# Patient Record
Sex: Male | Born: 1950
Health system: Southern US, Community
[De-identification: ages and names within clinical notes are randomized; demographics above are authoritative.]

## PROBLEM LIST (undated history)

## (undated) DIAGNOSIS — F32A Depression, unspecified: Secondary | ICD-10-CM

## (undated) DIAGNOSIS — I499 Cardiac arrhythmia, unspecified: Secondary | ICD-10-CM

## (undated) DIAGNOSIS — J309 Allergic rhinitis, unspecified: Secondary | ICD-10-CM

## (undated) DIAGNOSIS — M1990 Unspecified osteoarthritis, unspecified site: Secondary | ICD-10-CM

## (undated) DIAGNOSIS — I639 Cerebral infarction, unspecified: Secondary | ICD-10-CM

## (undated) DIAGNOSIS — R011 Cardiac murmur, unspecified: Secondary | ICD-10-CM

## (undated) DIAGNOSIS — K5792 Diverticulitis of intestine, part unspecified, without perforation or abscess without bleeding: Secondary | ICD-10-CM

## (undated) DIAGNOSIS — E785 Hyperlipidemia, unspecified: Secondary | ICD-10-CM

## (undated) DIAGNOSIS — I4891 Unspecified atrial fibrillation: Secondary | ICD-10-CM

## (undated) DIAGNOSIS — R6882 Decreased libido: Secondary | ICD-10-CM

## (undated) DIAGNOSIS — M5136 Other intervertebral disc degeneration, lumbar region: Secondary | ICD-10-CM

## (undated) DIAGNOSIS — E291 Testicular hypofunction: Secondary | ICD-10-CM

## (undated) DIAGNOSIS — R7303 Prediabetes: Secondary | ICD-10-CM

## (undated) DIAGNOSIS — Z9989 Dependence on other enabling machines and devices: Secondary | ICD-10-CM

## (undated) DIAGNOSIS — N132 Hydronephrosis with renal and ureteral calculous obstruction: Secondary | ICD-10-CM

## (undated) DIAGNOSIS — T7840XA Allergy, unspecified, initial encounter: Secondary | ICD-10-CM

## (undated) DIAGNOSIS — G4733 Obstructive sleep apnea (adult) (pediatric): Secondary | ICD-10-CM

## (undated) DIAGNOSIS — Z7901 Long term (current) use of anticoagulants: Secondary | ICD-10-CM

## (undated) DIAGNOSIS — K219 Gastro-esophageal reflux disease without esophagitis: Secondary | ICD-10-CM

## (undated) DIAGNOSIS — Z87442 Personal history of urinary calculi: Secondary | ICD-10-CM

## (undated) DIAGNOSIS — N201 Calculus of ureter: Secondary | ICD-10-CM

## (undated) DIAGNOSIS — K5790 Diverticulosis of intestine, part unspecified, without perforation or abscess without bleeding: Secondary | ICD-10-CM

## (undated) DIAGNOSIS — G4730 Sleep apnea, unspecified: Secondary | ICD-10-CM

## (undated) DIAGNOSIS — T8859XA Other complications of anesthesia, initial encounter: Secondary | ICD-10-CM

## (undated) DIAGNOSIS — F419 Anxiety disorder, unspecified: Secondary | ICD-10-CM

## (undated) DIAGNOSIS — Z8679 Personal history of other diseases of the circulatory system: Secondary | ICD-10-CM

## (undated) DIAGNOSIS — N400 Enlarged prostate without lower urinary tract symptoms: Secondary | ICD-10-CM

## (undated) DIAGNOSIS — M51369 Other intervertebral disc degeneration, lumbar region without mention of lumbar back pain or lower extremity pain: Secondary | ICD-10-CM

## (undated) DIAGNOSIS — I10 Essential (primary) hypertension: Secondary | ICD-10-CM

## (undated) DIAGNOSIS — K760 Fatty (change of) liver, not elsewhere classified: Secondary | ICD-10-CM

## (undated) HISTORY — PX: KNEE SURGERY: SHX244

## (undated) HISTORY — DX: Cerebral infarction, unspecified: I63.9

## (undated) HISTORY — DX: Hyperlipidemia, unspecified: E78.5

## (undated) HISTORY — DX: Allergy, unspecified, initial encounter: T78.40XA

## (undated) HISTORY — PX: FINGER SURGERY: SHX640

## (undated) HISTORY — DX: Gastro-esophageal reflux disease without esophagitis: K21.9

## (undated) HISTORY — DX: Sleep apnea, unspecified: G47.30

## (undated) HISTORY — DX: Essential (primary) hypertension: I10

## (undated) HISTORY — DX: Hydronephrosis with renal and ureteral calculous obstruction: N13.2

## (undated) HISTORY — PX: OTHER SURGICAL HISTORY: SHX169

## (undated) HISTORY — DX: Allergic rhinitis, unspecified: J30.9

## (undated) HISTORY — DX: Decreased libido: R68.82

## (undated) HISTORY — DX: Calculus of ureter: N20.1

## (undated) HISTORY — PX: EXTERNAL EAR SURGERY: SHX627

## (undated) HISTORY — DX: Diverticulitis of intestine, part unspecified, without perforation or abscess without bleeding: K57.92

## (undated) HISTORY — PX: COLONOSCOPY: SHX174

## (undated) HISTORY — PX: EYE SURGERY: SHX253

## (undated) HISTORY — DX: Testicular hypofunction: E29.1

---

## 2007-07-25 ENCOUNTER — Ambulatory Visit: Payer: Self-pay | Admitting: Ophthalmology

## 2009-05-07 ENCOUNTER — Ambulatory Visit: Payer: Self-pay | Admitting: Family Medicine

## 2011-10-18 ENCOUNTER — Ambulatory Visit: Payer: Self-pay | Admitting: Ophthalmology

## 2011-11-01 ENCOUNTER — Ambulatory Visit: Payer: Self-pay | Admitting: Ophthalmology

## 2013-06-28 ENCOUNTER — Ambulatory Visit: Payer: Self-pay | Admitting: Family Medicine

## 2013-07-18 ENCOUNTER — Ambulatory Visit: Payer: Self-pay | Admitting: Urology

## 2013-09-26 ENCOUNTER — Ambulatory Visit: Payer: Self-pay | Admitting: Gastroenterology

## 2013-11-11 ENCOUNTER — Ambulatory Visit: Payer: Self-pay

## 2013-11-11 ENCOUNTER — Other Ambulatory Visit: Payer: Self-pay | Admitting: Occupational Medicine

## 2013-11-11 DIAGNOSIS — R52 Pain, unspecified: Secondary | ICD-10-CM

## 2015-06-30 ENCOUNTER — Encounter: Payer: Self-pay | Admitting: Family Medicine

## 2015-06-30 ENCOUNTER — Ambulatory Visit (INDEPENDENT_AMBULATORY_CARE_PROVIDER_SITE_OTHER): Payer: Managed Care, Other (non HMO) | Admitting: Family Medicine

## 2015-06-30 ENCOUNTER — Telehealth: Payer: Self-pay | Admitting: Family Medicine

## 2015-06-30 VITALS — BP 130/80 | HR 71 | Temp 98.9°F | Resp 18 | Ht 71.0 in | Wt 257.9 lb

## 2015-06-30 DIAGNOSIS — K5732 Diverticulitis of large intestine without perforation or abscess without bleeding: Secondary | ICD-10-CM | POA: Diagnosis not present

## 2015-06-30 DIAGNOSIS — G4733 Obstructive sleep apnea (adult) (pediatric): Secondary | ICD-10-CM | POA: Insufficient documentation

## 2015-06-30 DIAGNOSIS — Z9989 Dependence on other enabling machines and devices: Secondary | ICD-10-CM

## 2015-06-30 DIAGNOSIS — R609 Edema, unspecified: Secondary | ICD-10-CM | POA: Insufficient documentation

## 2015-06-30 DIAGNOSIS — N23 Unspecified renal colic: Secondary | ICD-10-CM | POA: Diagnosis not present

## 2015-06-30 LAB — POCT URINALYSIS DIPSTICK
Bilirubin, UA: NEGATIVE
Glucose, UA: NEGATIVE
Ketones, UA: NEGATIVE
Leukocytes, UA: NEGATIVE
Nitrite, UA: NEGATIVE
Spec Grav, UA: 1.01
Urobilinogen, UA: 0.2
pH, UA: 5

## 2015-06-30 MED ORDER — LEVOFLOXACIN 750 MG PO TABS: 750.0000 mg | ORAL_TABLET | Freq: Every day | ORAL | Status: DC

## 2015-06-30 NOTE — Telephone Encounter (Signed)
Patient called back to give you the name of a medication. It is called Ketorolac  and he take 1 tablet 3x daily as needed.

## 2015-06-30 NOTE — Patient Instructions (Addendum)
Obesity Obesity is defined as having too much total body fat and a body mass index (BMI) of 30 or more. BMI is an estimate of body fat and is calculated from your height and weight. Obesity happens when you consume more calories than you can burn by exercising or performing daily physical tasks. Prolonged obesity can cause major illnesses or emergencies, such as:   Stroke.  Heart disease.  Diabetes.  Cancer.  Arthritis.  High blood pressure (hypertension).  High cholesterol.  Sleep apnea.  Erectile dysfunction.  Infertility problems. CAUSES   Regularly eating unhealthy foods.  Physical inactivity.  Certain disorders, such as an underactive thyroid (hypothyroidism), Cushing's syndrome, and polycystic ovarian syndrome.  Certain medicines, such as steroids, some depression medicines, and antipsychotics.  Genetics.  Lack of sleep. DIAGNOSIS  A health care provider can diagnose obesity after calculating your BMI. Obesity will be diagnosed if your BMI is 30 or higher.  There are other methods of measuring obesity levels. Some other methods include measuring your skinfold thickness, your waist circumference, and comparing your hip circumference to your waist circumference. TREATMENT  A healthy treatment program includes some or all of the following:  Long-term dietary changes.  Exercise and physical activity.  Behavioral and lifestyle changes.  Medicine only under the supervision of your health care provider. Medicines may help, but only if they are used with diet and exercise programs. An unhealthy treatment program includes:  Fasting.  Fad diets.  Supplements and drugs. These choices do not succeed in long-term weight control.  HOME CARE INSTRUCTIONS   Exercise and perform physical activity as directed by your health care provider. To increase physical activity, try the following:  Use stairs instead of elevators.  Park farther away from store  entrances.  Garden, bike, or walk instead of watching television or using the computer.  Eat healthy, low-calorie foods and drinks on a regular basis. Eat more fruits and vegetables. Use low-calorie cookbooks or take healthy cooking classes.  Limit fast food, sweets, and processed snack foods.  Eat smaller portions.  Keep a daily journal of everything you eat. There are many free websites to help you with this. It may be helpful to measure your foods so you can determine if you are eating the correct portion sizes.  Avoid drinking alcohol. Drink more water and drinks without calories.  Take vitamins and supplements only as recommended by your health care provider.  Weight-loss support groups, Government social research officer, counselors, and stress reduction education can also be very helpful. SEEK IMMEDIATE MEDICAL CARE IF:  You have chest pain or tightness.  You have trouble breathing or feel short of breath.  You have weakness or leg numbness.  You feel confused or have trouble talking.  You have sudden changes in your vision. MAKE SURE YOU:  Understand these instructions.  Will watch your condition.  Will get help right away if you are not doing well or get worse. Document Released: 12/29/2004 Document Revised: 04/07/2014 Document Reviewed: 12/28/2011 [facility] Patient Information 2015 [place], [place]. This information is not intended to replace advice given to you by your health care provider. Make sure you discuss any questions you have with your health care provider. Kidney Stones Kidney stones (urolithiasis) are deposits that form inside your kidneys. The intense pain is caused by the stone moving through the urinary tract. When the stone moves, the ureter goes into spasm around the stone. The stone is usually passed in the urine.  CAUSES   A disorder that makes  certain neck glands produce too much parathyroid hormone (primary hyperparathyroidism).  A buildup of uric acid  crystals, similar to gout in your joints.  Narrowing (stricture) of the ureter.  A kidney obstruction present at birth (congenital obstruction).  Previous surgery on the kidney or ureters.  Numerous kidney infections. SYMPTOMS   Feeling sick to your stomach (nauseous).  Throwing up (vomiting).  Blood in the urine (hematuria).  Pain that usually spreads (radiates) to the groin.  Frequency or urgency of urination. DIAGNOSIS   Taking a history and physical exam.  Blood or urine tests.  CT scan.  Occasionally, an examination of the inside of the urinary bladder (cystoscopy) is performed. TREATMENT   Observation.  Increasing your fluid intake.  Extracorporeal shock wave lithotripsy--This is a noninvasive procedure that uses shock waves to break up kidney stones.  Surgery may be needed if you have severe pain or persistent obstruction. There are various surgical procedures. Most of the procedures are performed with the use of small instruments. Only small incisions are needed to accommodate these instruments, so recovery time is minimized. The size, location, and chemical composition are all important variables that will determine the proper choice of action for you. Talk to your health care provider to better understand your situation so that you will minimize the risk of injury to yourself and your kidney.  HOME CARE INSTRUCTIONS   Drink enough water and fluids to keep your urine clear or pale yellow. This will help you to pass the stone or stone fragments.  Strain all urine through the provided strainer. Keep all particulate matter and stones for your health care provider to see. The stone causing the pain may be as small as a grain of salt. It is very important to use the strainer each and every time you pass your urine. The collection of your stone will allow your health care provider to analyze it and verify that a stone has actually passed. The stone analysis will often  identify what you can do to reduce the incidence of recurrences.  Only take over-the-counter or prescription medicines for pain, discomfort, or fever as directed by your health care provider.  Make a follow-up appointment with your health care provider as directed.  Get follow-up X-rays if required. The absence of pain does not always mean that the stone has passed. It may have only stopped moving. If the urine remains completely obstructed, it can cause loss of kidney function or even complete destruction of the kidney. It is your responsibility to make sure X-rays and follow-ups are completed. Ultrasounds of the kidney can show blockages and the status of the kidney. Ultrasounds are not associated with any radiation and can be performed easily in a matter of minutes. SEEK MEDICAL CARE IF:  You experience pain that is progressive and unresponsive to any pain medicine you have been prescribed. SEEK IMMEDIATE MEDICAL CARE IF:   Pain cannot be controlled with the prescribed medicine.  You have a fever or shaking chills.  The severity or intensity of pain increases over 18 hours and is not relieved by pain medicine.  You develop a new onset of abdominal pain.  You feel faint or pass out.  You are unable to urinate. MAKE SURE YOU:   Understand these instructions.  Will watch your condition.  Will get help right away if you are not doing well or get worse. Document Released: 11/21/2005 Document Revised: 07/24/2013 Document Reviewed: 04/24/2013 [facility] Patient Information 2015 [place], [place]. This information is  not intended to replace advice given to you by your health care provider. Make sure you discuss any questions you have with your health care provider. Diverticulitis Diverticulitis is inflammation or infection of small pouches in your colon that form when you have a condition called diverticulosis. The pouches in your colon are called diverticula. Your colon, or large intestine,  is where water is absorbed and stool is formed. Complications of diverticulitis can include:  Bleeding.  Severe infection.  Severe pain.  Perforation of your colon.  Obstruction of your colon. CAUSES  Diverticulitis is caused by bacteria. Diverticulitis happens when stool becomes trapped in diverticula. This allows bacteria to grow in the diverticula, which can lead to inflammation and infection. RISK FACTORS People with diverticulosis are at risk for diverticulitis. Eating a diet that does not include enough fiber from fruits and vegetables may make diverticulitis more likely to develop. SYMPTOMS  Symptoms of diverticulitis may include:  Abdominal pain and tenderness. The pain is normally located on the left side of the abdomen, but may occur in other areas.  Fever and chills.  Bloating.  Cramping.  Nausea.  Vomiting.  Constipation.  Diarrhea.  Blood in your stool. DIAGNOSIS  Your health care provider will ask you about your medical history and do a physical exam. You may need to have tests done because many medical conditions can cause the same symptoms as diverticulitis. Tests may include:  Blood tests.  Urine tests.  Imaging tests of the abdomen, including X-rays and CT scans. When your condition is under control, your health care provider may recommend that you have a colonoscopy. A colonoscopy can show how severe your diverticula are and whether something else is causing your symptoms. TREATMENT  Most cases of diverticulitis are mild and can be treated at home. Treatment may include:  Taking over-the-counter pain medicines.  Following a clear liquid diet.  Taking antibiotic medicines by mouth for 7-10 days. More severe cases may be treated at a hospital. Treatment may include:  Not eating or drinking.  Taking prescription pain medicine.  Receiving antibiotic medicines through an IV tube.  Receiving fluids and nutrition through an IV  tube.  Surgery. HOME CARE INSTRUCTIONS   Follow your health care provider's instructions carefully.  Follow a full liquid diet or other diet as directed by your health care provider. After your symptoms improve, your health care provider may tell you to change your diet. He or she may recommend you eat a high-fiber diet. Fruits and vegetables are good sources of fiber. Fiber makes it easier to pass stool.  Take fiber supplements or probiotics as directed by your health care provider.  Only take medicines as directed by your health care provider.  Keep all your follow-up appointments. SEEK MEDICAL CARE IF:   Your pain does not improve.  You have a hard time eating food.  Your bowel movements do not return to normal. SEEK IMMEDIATE MEDICAL CARE IF:   Your pain becomes worse.  Your symptoms do not get better.  Your symptoms suddenly get worse.  You have a fever.  You have repeated vomiting.  You have bloody or black, tarry stools. MAKE SURE YOU:   Understand these instructions.  Will watch your condition.  Will get help right away if you are not doing well or get worse. Document Released: 08/31/2005 Document Revised: 11/26/2013 Document Reviewed: 10/16/2013 [facility] Patient Information 2015 [place], [place]. This information is not intended to replace advice given to you by your health care provider.  Make sure you discuss any questions you have with your health care provider.   SUPPORT SOCKS OTC

## 2015-06-30 NOTE — Telephone Encounter (Signed)
Pt was not seen by me this morning but I asked Myriam Jacobson and she said he was just calling to have medication added into his med list. Done.

## 2015-06-30 NOTE — Progress Notes (Signed)
Name: Tom Underwood   MRN: [id number]    DOB: 1951/11/03   Date:06/30/2015       Progress Note  Subjective  Chief Complaint  Chief Complaint  Patient presents with  . Flank Pain    kidney pain for 2 weeks   . Diverticulitis    Flank Pain This is a new problem. The current episode started 1 to 4 weeks ago. The problem occurs intermittently. The problem has been gradually worsening since onset. Pain location: Left flank area and left lower quadrant. The quality of the pain is described as aching. The pain is moderate. The pain is worse during the night. The symptoms are aggravated by bending. Associated symptoms include abdominal pain and dysuria. Risk factors: History of renal stones. He has tried nothing for the symptoms. The treatment provided no relief.  Abdominal Pain This is a new problem. The current episode started 1 to 4 weeks ago. The problem occurs constantly. The problem has been gradually worsening. The pain is located in the LLQ. The pain is moderate. The quality of the pain is aching. The abdominal pain radiates to the LLQ. Associated symptoms include dysuria. The pain is aggravated by movement. The pain is relieved by nothing. He has tried nothing for the symptoms. Improvement on treatment: CT scan about 2 years ago showed diverticulitis as well as renal stones he has been seen by a urologist in the past. Prior diagnostic workup includes CT scan.   Edema  Patient has a history of extremity edema. He has sleep apnea and has a CPAP machine at home but is not using with regularity.  Past Medical History  Diagnosis Date  . Diverticulitis     History  Substance Use Topics  . Smoking status: Never Smoker   . Smokeless tobacco: Not on file  . Alcohol Use: No     Current outpatient prescriptions:  .  omeprazole (PRILOSEC) 20 MG capsule, Take 20 mg by mouth daily., Disp: , Rfl:  .  omega-3 acid ethyl esters (LOVAZA) 1 G capsule, , Disp: , Rfl: 0  Allergies  Allergen  Reactions  . Penicillins Itching    Review of Systems  Cardiovascular: Positive for leg swelling.  Gastrointestinal: Positive for abdominal pain.  Genitourinary: Positive for dysuria and flank pain.     Objective  Filed Vitals:   06/30/15 0835  BP: 130/80  Pulse: 71  Temp: 98.9 F (37.2 C)  TempSrc: Oral  Resp: 18  Height:  (1.803 m)  Weight: 257 lb 14.4 oz (116.983 kg)  SpO2: 95%     Physical Exam  Constitutional: He is oriented to person, place, and time and well-developed, well-nourished, and in no distress.  Obese  HENT:  Head: Normocephalic.  Eyes: EOM are normal. Pupils are equal, round, and reactive to light.  Neck: Normal range of motion. Neck supple. No thyromegaly present.  Cardiovascular: Normal rate, regular rhythm and normal heart sounds.   No murmur heard. Pulmonary/Chest: Effort normal and breath sounds normal. No respiratory distress. He has no wheezes.  Abdominal: Soft. Bowel sounds are normal. There is tenderness (LEFT LOWER QUADRANT AND LEFT FLANK).  Musculoskeletal: Normal range of motion. He exhibits edema.  Lymphadenopathy:    He has no cervical adenopathy.  Neurological: He is alert and oriented to person, place, and time. No cranial nerve deficit. Gait normal. Coordination normal.  Skin: Skin is warm and dry. No rash noted.  Psychiatric: Affect and judgment normal.   Edema  Patient complains of  lower extremity edema. He does have obstructive sleep apnea but does not use his CPAP on a regular basis.    Assessment & Plan  1. Kidney pain Probable recurrence of nephrolithiasis - POCT urinalysis dipstick - levofloxacin (LEVAQUIN) 750 MG tablet; Take 1 tablet (750 mg total) by mouth daily.  Dispense: 14 tablet; Refill: 0  2. Diverticulitis of large intestine without perforation or abscess without bleeding Recurrence - levofloxacin (LEVAQUIN) 750 MG tablet; Take 1 tablet (750 mg total) by mouth daily.  Dispense: 14 tablet; Refill:  0  3. Edema Support hose stockings Used to  CPAP regularly  4. OSA on CPAP Encouraged to use his CPAP regularly

## 2015-07-14 ENCOUNTER — Ambulatory Visit (INDEPENDENT_AMBULATORY_CARE_PROVIDER_SITE_OTHER): Payer: Managed Care, Other (non HMO) | Admitting: Family Medicine

## 2015-07-14 ENCOUNTER — Encounter: Payer: Self-pay | Admitting: Family Medicine

## 2015-07-14 VITALS — BP 130/70 | HR 85 | Temp 97.3°F | Resp 18 | Ht 71.0 in | Wt 256.4 lb

## 2015-07-14 DIAGNOSIS — L989 Disorder of the skin and subcutaneous tissue, unspecified: Secondary | ICD-10-CM

## 2015-07-14 DIAGNOSIS — R319 Hematuria, unspecified: Secondary | ICD-10-CM

## 2015-07-14 DIAGNOSIS — K5732 Diverticulitis of large intestine without perforation or abscess without bleeding: Secondary | ICD-10-CM | POA: Diagnosis not present

## 2015-07-14 LAB — POCT URINALYSIS DIPSTICK
Bilirubin, UA: NEGATIVE
Ketones, UA: NEGATIVE
Leukocytes, UA: NEGATIVE
Protein, UA: NEGATIVE
Spec Grav, UA: 1.02
Urobilinogen, UA: NEGATIVE
pH, UA: 5

## 2015-07-14 MED ORDER — LEVOFLOXACIN 750 MG PO TABS: 750.0000 mg | ORAL_TABLET | Freq: Every day | ORAL | Status: DC

## 2015-07-14 NOTE — Progress Notes (Signed)
Name: Tom Underwood   MRN: [id number]    DOB: 1951-05-16   Date:07/14/2015       Progress Note  Subjective  Chief Complaint  Chief Complaint  Patient presents with  . Diverticulitis    1 month follow up, symptoms improved    Abdominal Pain This is a recurrent problem. The current episode started 1 to 4 weeks ago. The onset quality is gradual. The problem occurs intermittently. The problem has been gradually improving. The pain is located in the LLQ. The pain is mild. The abdominal pain does not radiate. Associated symptoms include constipation and hematuria. Pertinent negatives include no diarrhea, dysuria, hematochezia or melena. The pain is aggravated by certain positions. He has tried antibiotics and H2 blockers for the symptoms. The treatment provided moderate relief. Prior diagnostic workup includes lower endoscopy and CT scan. There is no history of colon cancer, Crohn's disease or PUD.  Hematuria This is a new problem. The current episode started 1 to 4 weeks ago. The problem has been resolved since onset. He describes the hematuria as microscopic hematuria. He is experiencing no pain. He describes his urine color as clear. Obstructive symptoms include a weak stream. Associated symptoms include abdominal pain. Pertinent negatives include no chills or dysuria. He is sexually active.      Past Medical History  Diagnosis Date  . Diverticulitis     History  Substance Use Topics  . Smoking status: Never Smoker   . Smokeless tobacco: Not on file  . Alcohol Use: No     Current outpatient prescriptions:  .  ketorolac (TORADOL) 10 MG tablet, Take 10 mg by mouth every 6 (six) hours as needed., Disp: , Rfl:  .  levofloxacin (LEVAQUIN) 750 MG tablet, Take 1 tablet (750 mg total) by mouth daily., Disp: 14 tablet, Rfl: 0 .  omega-3 acid ethyl esters (LOVAZA) 1 G capsule, , Disp: , Rfl: 0 .  omeprazole (PRILOSEC) 20 MG capsule, Take 20 mg by mouth daily., Disp: , Rfl:   Allergies   Allergen Reactions  . Penicillins Itching    Review of Systems  Constitutional: Negative for chills.  Gastrointestinal: Positive for abdominal pain and constipation. Negative for diarrhea, melena and hematochezia.  Genitourinary: Positive for hematuria. Negative for dysuria.     Objective  Filed Vitals:   07/14/15 0837  BP: 130/70  Pulse: 85  Temp: 97.3 F (36.3 C)  TempSrc: Oral  Resp: 18  Height:  (1.803 m)  Weight: 256 lb 6.4 oz (116.302 kg)  SpO2: 93%     Physical Exam  Constitutional: He is oriented to person, place, and time and well-developed, well-nourished, and in no distress.  HENT:  Head: Normocephalic.  Eyes: EOM are normal. Pupils are equal, round, and reactive to light.  Neck: Normal range of motion. Neck supple. No thyromegaly present.  Cardiovascular: Normal rate, regular rhythm and normal heart sounds.   No murmur heard. Pulmonary/Chest: Effort normal and breath sounds normal. No respiratory distress. He has no wheezes.  Abdominal: Soft. Bowel sounds are normal. There is tenderness.  Musculoskeletal: Normal range of motion. He exhibits no edema.  Lymphadenopathy:    He has no cervical adenopathy.  Neurological: He is alert and oriented to person, place, and time. No cranial nerve deficit. Gait normal. Coordination normal.  Skin: Skin is warm and dry. No rash noted.  Psychiatric: Affect and judgment normal.      Assessment & Plan  1. Diverticulitis of large intestine without perforation or abscess  without bleeding Improved - levofloxacin (LEVAQUIN) 750 MG tablet; Take 1 tablet (750 mg total) by mouth daily.  Dispense: 7 tablet; Refill: 0  2. Hematuria Resolved - levofloxacin (LEVAQUIN) 750 MG tablet; Take 1 tablet (750 mg total) by mouth daily.  Dispense: 7 tablet; Refill: 0 - POCT urinalysis dipstick  3. Skin lesions New-onset - Ambulatory referral to Dermatology

## 2015-08-03 ENCOUNTER — Ambulatory Visit (INDEPENDENT_AMBULATORY_CARE_PROVIDER_SITE_OTHER): Payer: Managed Care, Other (non HMO) | Admitting: Family Medicine

## 2015-08-03 ENCOUNTER — Encounter: Payer: Self-pay | Admitting: Family Medicine

## 2015-08-03 VITALS — BP 128/70 | HR 77 | Temp 98.7°F | Resp 18 | Ht 71.0 in | Wt 259.2 lb

## 2015-08-03 DIAGNOSIS — K5732 Diverticulitis of large intestine without perforation or abscess without bleeding: Secondary | ICD-10-CM

## 2015-08-03 DIAGNOSIS — G4733 Obstructive sleep apnea (adult) (pediatric): Secondary | ICD-10-CM

## 2015-08-03 DIAGNOSIS — E785 Hyperlipidemia, unspecified: Secondary | ICD-10-CM

## 2015-08-03 DIAGNOSIS — Z9989 Dependence on other enabling machines and devices: Secondary | ICD-10-CM

## 2015-08-03 MED ORDER — AMITRIPTYLINE HCL 25 MG PO TABS: 25.0000 mg | ORAL_TABLET | Freq: Every day | ORAL | Status: DC

## 2015-08-03 MED ORDER — TRAMADOL HCL 50 MG PO TABS: 50.0000 mg | ORAL_TABLET | Freq: Three times a day (TID) | ORAL | Status: DC | PRN

## 2015-08-03 NOTE — Progress Notes (Signed)
Name: Tom Underwood   MRN: [id number]    DOB: 1951-01-24   Date:08/03/2015       Progress Note  Subjective  Chief Complaint  Chief Complaint  Patient presents with  . Hyperlipidemia  . Diverticulitis    HPI  Hyperlipidemia  Patient has a history of hyperlipidemia for greater than 5 years.  Current medical regimen consist of .  Compliance is good .  Diet and exercise are currently followed . And Mrs .  Risk factors for cardiovascular disease include hyperlipidemia obesity and sedentary lifestyle .   There have been no side effects from the medication.    Diverticulosis/diverticulitis  Patient has nearly daily (for bowel and flank pain. It is exacerbated by certain salads by foods containing small seeds and by acidic foods as well. He currently has been taking ketorolac which was he was given for renal colic past and the pain is most severe. There's been no melena nor hematochezia nausea vomiting fever chills or weight loss. The patient had a colonoscopy about 23 years ago which only showed diverticula. He is currently not using a laxative though his stools are not constipated.  Obstructive sleep apnea   Patient has been on CPAP for number of years. He states that often he awakens with headaches. Congestion and was get to cough it up and chokes early in the morning on his congestion. He has history better affiliated with his CPAP does not know what setting is that has not changed.   Past Medical History  Diagnosis Date  . Diverticulitis     Social History  Substance Use Topics  . Smoking status: Never Smoker   . Smokeless tobacco: Not on file  . Alcohol Use: No     Current outpatient prescriptions:  .  ketorolac (TORADOL) 10 MG tablet, Take 10 mg by mouth every 6 (six) hours as needed., Disp: , Rfl:  .  omega-3 acid ethyl esters (LOVAZA) 1 G capsule, , Disp: , Rfl: 0 .  omeprazole (PRILOSEC) 20 MG capsule, Take 20 mg by mouth daily., Disp: , Rfl:   Allergies  Allergen  Reactions  . Penicillins Itching    Review of Systems  Constitutional: Negative for fever, chills and weight loss.  HENT: Negative for congestion, hearing loss, sore throat and tinnitus.   Eyes: Negative for blurred vision, double vision and redness.  Respiratory: Positive for cough and sputum production. Negative for hemoptysis and shortness of breath.        Cough and phlegm and sputum production associated with the humidifier with his CPAP  Cardiovascular: Negative for chest pain, palpitations, orthopnea, claudication and leg swelling.  Gastrointestinal: Positive for abdominal pain. Negative for heartburn, nausea, vomiting, diarrhea, constipation and blood in stool.  Genitourinary: Positive for flank pain. Negative for dysuria, urgency, frequency and hematuria.  Musculoskeletal: Negative for myalgias, back pain, joint pain, falls and neck pain.  Skin: Negative for itching.  Neurological: Negative for dizziness, tingling, tremors, focal weakness, seizures, loss of consciousness, weakness and headaches.  Endo/Heme/Allergies: Does not bruise/bleed easily.  Psychiatric/Behavioral: Negative for depression and substance abuse. The patient is nervous/anxious. The patient does not have insomnia.      Objective  Filed Vitals:   08/03/15 1451  BP: 128/70  Pulse: 77  Temp: 98.7 F (37.1 C)  TempSrc: Oral  Resp: 18  Height:  (1.803 m)  Weight: 259 lb 3.2 oz (117.572 kg)  SpO2: 98%     Physical Exam  Constitutional: He is oriented to  person, place, and time and well-developed, well-nourished, and in no distress.  HENT:  Head: Normocephalic.  Eyes: EOM are normal. Pupils are equal, round, and reactive to light.  Neck: Normal range of motion. Neck supple. No thyromegaly present.  Cardiovascular: Normal rate, regular rhythm and normal heart sounds.   No murmur heard. Pulmonary/Chest: Effort normal and breath sounds normal. No respiratory distress. He has no wheezes.  Abdominal:  Soft. Bowel sounds are normal. He exhibits no mass. There is tenderness (mild left lower quadrant and left flank pain to palpation no rebound or masses). There is no rebound and no guarding.  Musculoskeletal: Normal range of motion. He exhibits no edema.  Lymphadenopathy:    He has no cervical adenopathy.  Neurological: He is alert and oriented to person, place, and time. No cranial nerve deficit. Gait normal. Coordination normal.  Skin: Skin is warm and dry. No rash noted.  Psychiatric: Affect and judgment normal.      Assessment & Plan  1. Diverticulitis of large intestine without perforation or abscess without bleeding  - traMADol (ULTRAM) 50 MG tablet; Take 1 tablet (50 mg total) by mouth every 8 (eight) hours as needed.  Dispense: 30 tablet; Refill: 0 - amitriptyline (ELAVIL) 25 MG tablet; Take 1 tablet (25 mg total) by mouth at bedtime.  Dispense: 60 tablet; Refill: 5 - Comprehensive Metabolic Panel (CMET) - TSH  2. Hyperlipidemia No recent check - Lipid Profile  3. OSA on CPAP He is to find a way to decrease humidity on his CPAP as it seems to be excessive. - TSH

## 2015-08-04 DIAGNOSIS — E785 Hyperlipidemia, unspecified: Secondary | ICD-10-CM | POA: Insufficient documentation

## 2015-08-04 NOTE — Patient Instructions (Signed)
Patient is given instructions on how to find out about adjustment of his middle D on his CPAP machine online. With the onset of his sinuses or to do it he can get with his vendor to reduce the humidity in his CPAP

## 2015-08-08 LAB — COMPREHENSIVE METABOLIC PANEL
ALT: 18 IU/L (ref 0–44)
AST: 16 IU/L (ref 0–40)
Albumin/Globulin Ratio: 1.5 (ref 1.1–2.5)
Albumin: 4.3 g/dL (ref 3.6–4.8)
Alkaline Phosphatase: 93 IU/L (ref 39–117)
BUN/Creatinine Ratio: 20 (ref 10–22)
BUN: 19 mg/dL (ref 8–27)
Bilirubin Total: 0.9 mg/dL (ref 0.0–1.2)
CO2: 27 mmol/L (ref 18–29)
Calcium: 9.5 mg/dL (ref 8.6–10.2)
Chloride: 99 mmol/L (ref 97–108)
Creatinine, Ser: 0.93 mg/dL (ref 0.76–1.27)
GFR calc Af Amer: 101 mL/min/{1.73_m2} (ref >59)
GFR calc non Af Amer: 88 mL/min/{1.73_m2} (ref >59)
Globulin, Total: 2.8 g/dL (ref 1.5–4.5)
Glucose: 106 mg/dL — ABNORMAL HIGH (ref 65–99)
Potassium: 4.2 mmol/L (ref 3.5–5.2)
Sodium: 141 mmol/L (ref 134–144)
Total Protein: 7.1 g/dL (ref 6.0–8.5)

## 2015-08-08 LAB — LIPID PANEL
Chol/HDL Ratio: 2.6 ratio units (ref 0.0–5.0)
Cholesterol, Total: 156 mg/dL (ref 100–199)
HDL: 60 mg/dL (ref >39)
LDL Calculated: 89 mg/dL (ref 0–99)
Triglycerides: 36 mg/dL (ref 0–149)
VLDL Cholesterol Cal: 7 mg/dL (ref 5–40)

## 2015-08-08 LAB — TSH: TSH: 2.4 u[IU]/mL (ref 0.450–4.500)

## 2015-08-14 ENCOUNTER — Telehealth: Payer: Self-pay | Admitting: Emergency Medicine

## 2015-08-14 NOTE — Telephone Encounter (Signed)
Patient notified of lab results

## 2015-11-13 ENCOUNTER — Other Ambulatory Visit: Payer: Self-pay | Admitting: Family Medicine

## 2016-01-29 ENCOUNTER — Encounter: Payer: Self-pay | Admitting: *Deleted

## 2016-02-01 ENCOUNTER — Ambulatory Visit: Payer: Managed Care, Other (non HMO) | Admitting: Obstetrics and Gynecology

## 2016-02-01 ENCOUNTER — Encounter: Payer: Self-pay | Admitting: *Deleted

## 2016-02-02 ENCOUNTER — Encounter: Payer: Self-pay | Admitting: Urology

## 2016-02-02 ENCOUNTER — Ambulatory Visit (INDEPENDENT_AMBULATORY_CARE_PROVIDER_SITE_OTHER): Payer: Managed Care, Other (non HMO) | Admitting: Urology

## 2016-02-02 VITALS — BP 147/90 | HR 83 | Resp 16 | Ht 70.5 in | Wt 253.3 lb

## 2016-02-02 DIAGNOSIS — R310 Gross hematuria: Secondary | ICD-10-CM | POA: Diagnosis not present

## 2016-02-02 DIAGNOSIS — N138 Other obstructive and reflux uropathy: Secondary | ICD-10-CM

## 2016-02-02 DIAGNOSIS — N401 Enlarged prostate with lower urinary tract symptoms: Secondary | ICD-10-CM

## 2016-02-02 NOTE — Progress Notes (Signed)
02/02/2016 5:11 PM   Tom Underwood [birth year] [id number]  Referring provider: Dennison Mascot, MD [address] [place], [place] [address]  Chief Complaint  Patient presents with  . Nephrolithiasis    HPI: Patient is a 65 year old Caucasian male who believes that he may have passed a stone approximately 5 days ago.  He had the sudden onset of painless gross hematuria associated with the passage of 2 blood clots. The hematuria persisted and for less then 24 hours and then abated.  Patient has a prior history of nephrolithiasis. In 2014, he spontaneously passed a left UPJ stone. The stone composition is unknown.    Patient does not have any urinary complaints. His I PSS score is 3/1.  He does not have a recent PSA. His UA today is unremarkable.  He is a former smoker.  PMH: Past Medical History  Diagnosis Date  . Diverticulitis   . Hypogonadism in male   . Hydronephrosis with renal and ureteral calculus obstruction   . Decreased libido   . Sleep apnea   . HLD (hyperlipidemia)   . Rhinitis, allergic   . Hydronephrosis with renal and ureteral calculus obstruction   . Ureteral stone     Surgical History: Past Surgical History  Procedure Laterality Date  . External ear surgery    . Finger surgery    . Knee surgery    . Left eye      Home Medications:    Medication List       This list is accurate as of: 02/02/16  5:11 PM.  Always use your most recent med list.               amitriptyline 25 MG tablet  Commonly known as:  ELAVIL  Take 1 tablet (25 mg total) by mouth at bedtime.     omega-3 acid ethyl esters 1 g capsule  Commonly known as:  LOVAZA  take 2 capsules by mouth twice a day     omeprazole 20 MG capsule  Commonly known as:  PRILOSEC  Take 20 mg by mouth daily. Reported on 02/02/2016     traMADol 50 MG tablet  Commonly known as:  ULTRAM  Take 1 tablet (50 mg total) by mouth every 8 (eight) hours as needed.        Allergies:    Allergies  Allergen Reactions  . Penicillins Itching    Family History: Family History  Problem Relation Age of Onset  . Asthma Mother   . Heart disease Mother   . Asthma Father   . Heart disease Father   . Stroke Father   . Heart attack Mother     Social History:  reports that he has quit smoking. His smoking use included Pipe. He does not have any smokeless tobacco history on file. He reports that he does not drink alcohol or use illicit drugs.  ROS: UROLOGY Frequent Urination?: No Hard to postpone urination?: No Burning/pain with urination?: No Get up at night to urinate?: No Leakage of urine?: No Urine stream starts and stops?: No Trouble starting stream?: No Do you have to strain to urinate?: No Blood in urine?: Yes Urinary tract infection?: No Sexually transmitted disease?: No Injury to kidneys or bladder?: No Painful intercourse?: No Weak stream?: No Erection problems?: Yes Penile pain?: No  Gastrointestinal Nausea?: No Vomiting?: No Indigestion/heartburn?: No Diarrhea?: No Constipation?: No  Constitutional Fever: No Night sweats?: No Weight loss?: Yes Fatigue?: No  Skin Skin rash/lesions?:  No Itching?: No  Eyes Blurred vision?: No Double vision?: No  Ears/Nose/Throat Sore throat?: No Sinus problems?: No  Hematologic/Lymphatic Swollen glands?: No Easy bruising?: No  Cardiovascular Leg swelling?: No Chest pain?: No  Respiratory Cough?: No Shortness of breath?: No  Endocrine Excessive thirst?: No  Musculoskeletal Back pain?: No Joint pain?: No  Neurological Headaches?: No Dizziness?: No  Psychologic Depression?: No Anxiety?: No  Physical Exam: BP 147/90 mmHg  Pulse 83  Resp 16  Ht 5' 10.5" (1.791 m)  Wt 253 lb 4.8 oz (114.896 kg)  BMI 35.82 kg/m2  Constitutional: Well nourished. Alert and oriented, No acute distress. HEENT: [place] AT, moist mucus membranes. Trachea midline, no masses. Cardiovascular: No clubbing,  cyanosis, or edema. Respiratory: Normal respiratory effort, no increased work of breathing. GI: Abdomen is soft, non tender, non distended, no abdominal masses. Liver and spleen not palpable.  No hernias appreciated.  Stool sample for occult testing is not indicated.   GU: No CVA tenderness.  No bladder fullness or masses.  Patient with circumcised phallus.  Urethral meatus is patent.  No penile discharge. No penile lesions or rashes. Scrotum without lesions, cysts, rashes and/or edema.  Testicles are located scrotally bilaterally. No masses are appreciated in the testicles. Left and right epididymis are normal. Rectal: Patient with  normal sphincter tone. Anus and perineum without scarring or rashes. No rectal masses are appreciated. Prostate is approximately 55 grams, no nodules are appreciated. Seminal vesicles are normal. Skin: No rashes, bruises or suspicious lesions. Lymph: No cervical or inguinal adenopathy. Neurologic: Grossly intact, no focal deficits, moving all 4 extremities. Psychiatric: Normal mood and affect.  Laboratory Data:  Lab Results  Component Value Date   CREATININE 0.93 08/07/2015    Lab Results  Component Value Date   TSH 2.400 08/07/2015       Component Value Date/Time   CHOL 156 08/07/2015 0922   HDL 60 08/07/2015 0922   CHOLHDL 2.6 08/07/2015 0922   LDLCALC 89 08/07/2015 0922    Lab Results  Component Value Date   AST 16 08/07/2015   Lab Results  Component Value Date   ALT 18 08/07/2015     Urinalysis Results for orders placed or performed in visit on 02/02/16  Microscopic Examination  Result Value Ref Range   WBC, UA None seen 0 -  5 /hpf   RBC, UA 0-2 0 -  2 /hpf   Epithelial Cells (non renal) None seen 0 - 10 /hpf   Bacteria, UA None seen None seen/Few  Urinalysis, Complete  Result Value Ref Range   Specific Gravity, UA >1.030 (H) 1.005 - 1.030   pH, UA 6.0 5.0 - 7.5   Color, UA Yellow Yellow   Appearance Ur Clear Clear   Leukocytes,  UA Negative Negative   Protein, UA Negative Negative/Trace   Glucose, UA Negative Negative   Ketones, UA Negative Negative   RBC, UA 1+ (A) Negative   Bilirubin, UA Negative Negative   Urobilinogen, Ur 0.2 0.2 - 1.0 mg/dL   Nitrite, UA Negative Negative   Microscopic Examination See below:       Assessment & Plan:    1. Gross hematuria:   Explained to patient the causes of blood in the urine are as follows: stones, BPH, UTI's, damage to the urinary tract and/or cancer.  It is explained to the patient that they will be scheduled for a CT Urogram with contrast material and that in rare instances, an allergic reaction can be serious  and even life threatening with the injection of contrast material.   The patient denies any allergies to contrast, iodine and/or seafood and is not taking metformin.  I have explained to the patient that they will  be scheduled for a cystoscopy in our office to evaluate their bladder.  The cystoscopy consists of passing a tube with a lens up through their urethra and into their urinary bladder.   We will inject the urethra with a lidocaine gel prior to introducing the cystoscope to help with any discomfort during the procedure.   After the procedure, they might experience blood in the urine and discomfort with urination.  This will abate after the first few voids.  I have  encouraged the patient to increase water intake  during this time.  Patient denies any allergies to lidocaine.   - Urinalysis, Complete - CULTURE, URINE COMPREHENSIVE   2. BPH with LUTS:   IPSS score is 3/1.  We will continue to monitor.  He will have his IPSS score, exam and PSA in 12 months.  - PSA  Return for CT Urogram report and cystoscopy.  These notes generated with voice recognition software. I apologize for typographical errors.  Michiel Cowboy, PA-C  [facility] [address] [place], [place] [address] [phone]

## 2016-02-03 ENCOUNTER — Telehealth: Payer: Self-pay

## 2016-02-03 DIAGNOSIS — N400 Enlarged prostate without lower urinary tract symptoms: Secondary | ICD-10-CM | POA: Insufficient documentation

## 2016-02-03 DIAGNOSIS — N138 Other obstructive and reflux uropathy: Secondary | ICD-10-CM | POA: Insufficient documentation

## 2016-02-03 DIAGNOSIS — N401 Enlarged prostate with lower urinary tract symptoms: Secondary | ICD-10-CM

## 2016-02-03 DIAGNOSIS — R310 Gross hematuria: Secondary | ICD-10-CM | POA: Insufficient documentation

## 2016-02-03 LAB — MICROSCOPIC EXAMINATION
Bacteria, UA: NONE SEEN
Epithelial Cells (non renal): NONE SEEN /hpf (ref 0–10)
WBC, UA: NONE SEEN /hpf (ref 0–?)

## 2016-02-03 LAB — BUN+CREAT
BUN/Creatinine Ratio: 18 (ref 10–22)
BUN: 14 mg/dL (ref 8–27)
Creatinine, Ser: 0.77 mg/dL (ref 0.76–1.27)
GFR calc Af Amer: 112 mL/min/{1.73_m2} (ref >59)
GFR calc non Af Amer: 97 mL/min/{1.73_m2} (ref >59)

## 2016-02-03 LAB — URINALYSIS, COMPLETE
Bilirubin, UA: NEGATIVE
Glucose, UA: NEGATIVE
Ketones, UA: NEGATIVE
Leukocytes, UA: NEGATIVE
Nitrite, UA: NEGATIVE
Protein, UA: NEGATIVE
Specific Gravity, UA: >1.03 — ABNORMAL HIGH (ref 1.005–1.030)
Urobilinogen, Ur: 0.2 mg/dL (ref 0.2–1.0)
pH, UA: 6 (ref 5.0–7.5)

## 2016-02-03 LAB — PSA: Prostate Specific Ag, Serum: 0.3 ng/mL (ref 0.0–4.0)

## 2016-02-03 NOTE — Telephone Encounter (Signed)
LMOM

## 2016-02-03 NOTE — Telephone Encounter (Signed)
-----   Message from Harle Battiest, PA-C sent at 02/03/2016  8:56 AM EST ----- Patient's PSA is normal.  His CT urogram and cystoscopy are pending.

## 2016-02-04 LAB — CULTURE, URINE COMPREHENSIVE

## 2016-02-04 NOTE — Telephone Encounter (Signed)
Spoke with pt in reference to PSA and imaging. Pt states that CT and cysto both are scheduled.

## 2016-02-12 ENCOUNTER — Ambulatory Visit
Admission: RE | Admit: 2016-02-12 | Discharge: 2016-02-12 | Disposition: A | Discharge status: Home / Self Care | Payer: Managed Care, Other (non HMO) | Source: Ambulatory Visit | Attending: Urology | Admitting: Urology

## 2016-02-12 DIAGNOSIS — M47896 Other spondylosis, lumbar region: Secondary | ICD-10-CM | POA: Insufficient documentation

## 2016-02-12 DIAGNOSIS — K5730 Diverticulosis of large intestine without perforation or abscess without bleeding: Secondary | ICD-10-CM | POA: Insufficient documentation

## 2016-02-12 DIAGNOSIS — K4020 Bilateral inguinal hernia, without obstruction or gangrene, not specified as recurrent: Secondary | ICD-10-CM | POA: Insufficient documentation

## 2016-02-12 DIAGNOSIS — R310 Gross hematuria: Secondary | ICD-10-CM | POA: Diagnosis present

## 2016-02-12 DIAGNOSIS — K760 Fatty (change of) liver, not elsewhere classified: Secondary | ICD-10-CM | POA: Diagnosis not present

## 2016-02-12 DIAGNOSIS — M5136 Other intervertebral disc degeneration, lumbar region: Secondary | ICD-10-CM | POA: Diagnosis not present

## 2016-02-12 DIAGNOSIS — N200 Calculus of kidney: Secondary | ICD-10-CM | POA: Diagnosis not present

## 2016-02-12 MED ORDER — IOHEXOL 300 MG/ML  SOLN
125.0000 mL | Freq: Once | INTRAMUSCULAR | Status: AC | PRN
  Administered 2016-02-12: 125 mL via INTRAVENOUS

## 2016-02-19 ENCOUNTER — Encounter: Payer: Self-pay | Admitting: Urology

## 2016-02-19 ENCOUNTER — Ambulatory Visit (INDEPENDENT_AMBULATORY_CARE_PROVIDER_SITE_OTHER): Payer: Managed Care, Other (non HMO) | Admitting: Urology

## 2016-02-19 VITALS — BP 143/85 | HR 74 | Ht 70.0 in | Wt 253.8 lb

## 2016-02-19 DIAGNOSIS — R310 Gross hematuria: Secondary | ICD-10-CM

## 2016-02-19 NOTE — Progress Notes (Signed)
02/19/2016 11:13 AM   Tom Underwood [birth year] [id number]  Referring provider: Dennison Mascot, MD [address] [place], [place] [address]  Chief Complaint  Patient presents with  . Cysto    gross hematuria    HPI: Patient is a 65 year old Caucasian male who believes that he may have passed a stone approximately 5 days ago. He had the sudden onset of painless gross hematuria associated with the passage of 2 blood clots. The hematuria persisted and for less then 24 hours and then abated.  Patient has a prior history of nephrolithiasis. In 2014, he spontaneously passed a left UPJ stone. The stone composition is unknown.   Patient does not have any urinary complaints. His I PSS score is 3/1. He does not have a recent PSA. His UA today is unremarkable. He is a former smoker.  CT urogram revealed a 1 mm nonobstructing left mid upper pole calculus. It also showed a 1.4 cm hypodense cyst of the left mid pole kidney that is unchanged in size since 2014. He's had no further blood in his urine since that time.   PMH: Past Medical History  Diagnosis Date  . Diverticulitis   . Hypogonadism in male   . Hydronephrosis with renal and ureteral calculus obstruction   . Decreased libido   . Sleep apnea   . HLD (hyperlipidemia)   . Rhinitis, allergic   . Hydronephrosis with renal and ureteral calculus obstruction   . Ureteral stone     Surgical History: Past Surgical History  Procedure Laterality Date  . External ear surgery    . Finger surgery    . Knee surgery    . Left eye      Home Medications:    Medication List       This list is accurate as of: 02/19/16 11:13 AM.  Always use your most recent med list.               amitriptyline 25 MG tablet  Commonly known as:  ELAVIL  Take 1 tablet (25 mg total) by mouth at bedtime.     omega-3 acid ethyl esters 1 g capsule  Commonly known as:  LOVAZA  take 2 capsules by mouth twice a day     omeprazole 20  MG capsule  Commonly known as:  PRILOSEC  Take 20 mg by mouth daily. Reported on 02/19/2016     traMADol 50 MG tablet  Commonly known as:  ULTRAM  Take 1 tablet (50 mg total) by mouth every 8 (eight) hours as needed.        Allergies:  Allergies  Allergen Reactions  . Penicillins Itching    Family History: Family History  Problem Relation Age of Onset  . Asthma Mother   . Heart disease Mother   . Asthma Father   . Heart disease Father   . Stroke Father   . Heart attack Mother     Social History:  reports that he has quit smoking. His smoking use included Pipe. He does not have any smokeless tobacco history on file. He reports that he does not drink alcohol or use illicit drugs.  ROS:                                        Physical Exam: BP 143/85 mmHg  Pulse 74  Ht  (1.778 m)  Wt 253  lb 12.8 oz (115.123 kg)  BMI 36.42 kg/m2  Constitutional:  Alert and oriented, No acute distress. HEENT: [place] AT, moist mucus membranes.  Trachea midline, no masses. Cardiovascular: No clubbing, cyanosis, or edema. Respiratory: Normal respiratory effort, no increased work of breathing. GI: Abdomen is soft, nontender, nondistended, no abdominal masses GU: No CVA tenderness.  Skin: No rashes, bruises or suspicious lesions. Lymph: No cervical or inguinal adenopathy. Neurologic: Grossly intact, no focal deficits, moving all 4 extremities. Psychiatric: Normal mood and affect.  Laboratory Data: No results found for: WBC, HGB, HCT, MCV, PLT  Lab Results  Component Value Date   CREATININE 0.77 02/02/2016    No results found for: PSA  No results found for: TESTOSTERONE  No results found for: HGBA1C  Urinalysis    Component Value Date/Time   APPEARANCEUR Clear 02/02/2016 1605   GLUCOSEU Negative 02/02/2016 1605   BILIRUBINUR Negative 02/02/2016 1605   BILIRUBINUR negative 07/14/2015 0922   PROTEINUR Negative 02/02/2016 1605   PROTEINUR negative  07/14/2015 0922   UROBILINOGEN negative 07/14/2015 0922   NITRITE Negative 02/02/2016 1605   NITRITE negative 06/30/2015 0842   LEUKOCYTESUR Negative 02/02/2016 1605   LEUKOCYTESUR Negative 07/14/2015 0922    Pertinent Imaging: Study Result     CLINICAL DATA: Sudden onset painless gross hematuria.  EXAM: CT ABDOMEN AND PELVIS WITHOUT AND WITH CONTRAST  TECHNIQUE: Multidetector CT imaging of the abdomen and pelvis was performed following the standard protocol before and following the bolus administration of intravenous contrast.  CONTRAST: OMNIPAQUE IOHEXOL 300 MG/ML SOLN  COMPARISON: 06/28/2013  FINDINGS: Lower chest: Unremarkable  Hepatobiliary: Diffuse hepatic steatosis.  Pancreas: Unremarkable  Spleen: Unremarkable  Adrenals/Urinary Tract: 1 mm nonobstructive left mid upper kidney calculus, image 78 series 10. No ureteral or bladder calculus.  1.4 by 0.7 by 0.8 cm left mid upper kidney hypodense lesion, image 33 series 4, difficult to characterize due to small size, similar-sized on 06/28/2013.  Stomach/Bowel: Scattered diverticula of the descending colon. Appendix normal.  Vascular/Lymphatic: Unremarkable  Reproductive: Linear calcifications in the prostate gland.  Other: No supplemental non-categorized findings.  Musculoskeletal: Mild left distal psoas muscle fatty atrophy. This is stable from 2014.  Bridging spurring of both sacroiliac joints.  Lumbar spondylosis and degenerative disc disease cause foraminal impingement on the left at L1-2, L2-3, and L4-5 ; and on the right at L3-4 and L4-5. There is loss of intervertebral disc height at all levels between L1 and L5.  Small bilateral direct inguinal hernias contain adipose tissue.  IMPRESSION: 1. 1 mm nonobstructive left mid upper renal calculus, image 78 series 10. No specific additional cause for hematuria identified. 2. There is a 1.4 by 0.7 by 0.8 cm left mid  kidney upper pole hypodense homogeneous lesion, probably a cyst but difficult to definitively characterize due to small size. This is not changed from 2014. 3. Diffuse hepatic steatosis. 4. Scattered colonic diverticula. 5. Lumbar spondylosis and degenerative disc disease causing impingement at L1-2, L2-3, L3-4, and L4-5 as noted above. 6. Small bilateral direct inguinal hernias contain adipose tissue.     Cystoscopy Procedure Note  Patient identification was confirmed, informed consent was obtained, and patient was prepped using Betadine solution.  Lidocaine jelly was administered per urethral meatus.    Preoperative abx where received prior to procedure.     Pre-Procedure: - Inspection reveals a normal caliber ureteral meatus.  Procedure: The flexible cystoscope was introduced without difficulty - No urethral strictures/lesions are present. - Normal prostate  - Normal bladder  neck - Bilateral ureteral orifices identified - Bladder mucosa  reveals no ulcers, tumors, or lesions - No bladder stones - No trabeculation  Retroflexion shows no intravesical lobe.   Post-Procedure: - Patient tolerated the procedure well    Assessment & Plan:    1. Gross hematuria -negative workup except for 1 mm stone. This was likely due to a passed stone. He will follow-up in one year for repeat urinalysis.  2. 1.4 centimeter left hypodense renal cyst This is unchanged since 2014, so it requires no further workup.   3. 1 mm nonobstructing left upper pole stone Due to its size, no further workup is needed at this time.  Return in about 1 year (around 02/18/2017) for for repeat urinalysis.  Hildred LaserBrian James Jonna Dittrich, MD  [facility] [address], [address] [place], [place] [address] [phone]

## 2016-02-20 LAB — URINALYSIS, COMPLETE
Bilirubin, UA: NEGATIVE
Glucose, UA: NEGATIVE
Ketones, UA: NEGATIVE
Leukocytes, UA: NEGATIVE
Nitrite, UA: NEGATIVE
Protein, UA: NEGATIVE
Specific Gravity, UA: 1.02 (ref 1.005–1.030)
Urobilinogen, Ur: 0.2 mg/dL (ref 0.2–1.0)
pH, UA: 6.5 (ref 5.0–7.5)

## 2016-02-20 LAB — MICROSCOPIC EXAMINATION
Bacteria, UA: NONE SEEN
Epithelial Cells (non renal): NONE SEEN /hpf (ref 0–10)

## 2017-01-27 ENCOUNTER — Other Ambulatory Visit: Payer: Self-pay

## 2017-01-27 NOTE — Telephone Encounter (Signed)
[facility]

## 2017-01-30 ENCOUNTER — Ambulatory Visit (INDEPENDENT_AMBULATORY_CARE_PROVIDER_SITE_OTHER): Payer: Managed Care, Other (non HMO) | Admitting: Family Medicine

## 2017-01-30 ENCOUNTER — Encounter: Payer: Self-pay | Admitting: Family Medicine

## 2017-01-30 VITALS — BP 140/74 | HR 82 | Temp 98.0°F | Resp 17 | Ht 70.0 in | Wt 277.6 lb

## 2017-01-30 DIAGNOSIS — M172 Bilateral post-traumatic osteoarthritis of knee: Secondary | ICD-10-CM

## 2017-01-30 DIAGNOSIS — M1710 Unilateral primary osteoarthritis, unspecified knee: Secondary | ICD-10-CM | POA: Insufficient documentation

## 2017-01-30 DIAGNOSIS — M179 Osteoarthritis of knee, unspecified: Secondary | ICD-10-CM | POA: Insufficient documentation

## 2017-01-30 DIAGNOSIS — E785 Hyperlipidemia, unspecified: Secondary | ICD-10-CM | POA: Diagnosis not present

## 2017-01-30 DIAGNOSIS — R739 Hyperglycemia, unspecified: Secondary | ICD-10-CM

## 2017-01-30 LAB — POCT GLYCOSYLATED HEMOGLOBIN (HGB A1C): Hemoglobin A1C: 6

## 2017-01-30 MED ORDER — DICLOFENAC SODIUM 75 MG PO TBEC: 75.0000 mg | DELAYED_RELEASE_TABLET | Freq: Every day | ORAL | 0 refills | Status: DC

## 2017-01-30 MED ORDER — OMEGA-3-ACID ETHYL ESTERS 1 G PO CAPS: 2.0000 | ORAL_CAPSULE | Freq: Two times a day (BID) | ORAL | 0 refills | Status: DC

## 2017-01-30 NOTE — Progress Notes (Signed)
Name: Tom Underwood   MRN: [id number]    DOB: Apr 19, 1951   Date:01/30/2017       Progress Note  Subjective  Chief Complaint  Chief Complaint  Patient presents with  . Medication Refill    Hyperlipidemia  This is a chronic problem. The problem is controlled. Recent lipid tests were reviewed and are normal. Exacerbating diseases include obesity. He has no history of diabetes or liver disease. Treatments tried: Lovaza 2g every morning.   Pt. 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 He has pain and stiffness in the morning which gets better throughout the day as he becomes more active. Initially, he was on Naproxen which was stopped due to hx of Diverticulitis. Three weeks ago, he was prescribed Diclofenac 75 mg daily by the Urgent Care for pain in his knees. This seems to help with his pain throughout the day, no side effects reported. He is requesting a refill for Diclofenac.  Past Medical History:  Diagnosis Date  . Decreased libido   . Diverticulitis   . HLD (hyperlipidemia)   . Hydronephrosis with renal and ureteral calculus obstruction   . Hydronephrosis with renal and ureteral calculus obstruction   . Hypogonadism in male   . Rhinitis, allergic   . Sleep apnea   . Ureteral stone     Past Surgical History:  Procedure Laterality Date  . EXTERNAL EAR SURGERY    . FINGER SURGERY    . KNEE SURGERY    . left eye      Family History  Problem Relation Age of Onset  . Asthma Mother   . Heart disease Mother   . Heart attack Mother   . Asthma Father   . Heart disease Father   . Stroke Father     Social History   Social History  . Marital status: Married    Spouse name: N/A  . Number of children: N/A  . Years of education: N/A   Occupational History  . Not on file.   Social History Main Topics  . Smoking status: Former Smoker    Types: Pipe  . Smokeless tobacco: Never Used  . Alcohol use No  .  Drug use: No  . Sexual activity: Yes    Partners: Female   Other Topics Concern  . Not on file   Social History Narrative  . No narrative on file     Current Outpatient Prescriptions:  .  amitriptyline (ELAVIL) 25 MG tablet, Take 1 tablet (25 mg total) by mouth at bedtime., Disp: 60 tablet, Rfl: 5 .  benzonatate (TESSALON) 100 MG capsule, , Disp: , Rfl:  .  diclofenac (VOLTAREN) 75 MG EC tablet, , Disp: , Rfl:  .  omega-3 acid ethyl esters (LOVAZA) 1 G capsule, take 2 capsules by mouth twice a day, Disp: 120 capsule, Rfl: 5 .  omeprazole (PRILOSEC) 20 MG capsule, Take 20 mg by mouth daily. Reported on 02/19/2016, Disp: , Rfl:  .  traMADol (ULTRAM) 50 MG tablet, Take 1 tablet (50 mg total) by mouth every 8 (eight) hours as needed. (Patient not taking: Reported on 02/19/2016), Disp: 30 tablet, Rfl: 0  Allergies  Allergen Reactions  . Penicillins Itching     ROS  Please see HPI for complete discussion of ROS  Objective  Vitals:   01/30/17 1512  BP: 140/74  Pulse: 82  Resp: 17  Temp: 98 F (36.7 C)  TempSrc:  Oral  SpO2: 96%  Weight: 277 lb 9.6 oz (125.9 kg)  Height: 5\' 10"  (1.778 m)    Physical Exam  Constitutional: He is oriented to person, place, and time and well-developed, well-nourished, and in no distress.  Cardiovascular: Normal rate, regular rhythm and normal heart sounds.   No murmur heard. Pulmonary/Chest: Effort normal and breath sounds normal. He has no wheezes.  Abdominal: Soft. Bowel sounds are normal. There is no tenderness.  Musculoskeletal: He exhibits no edema.       Right ankle: He exhibits swelling.       Left ankle: He exhibits swelling.  Trace pitting edema bilateral ankles  Neurological: He is alert and oriented to person, place, and time.  Nursing note and vitals reviewed.    Assessment & Plan  1. Hyperglycemia A1c is 6.0%, consistent with prediabetes.  - POCT glycosylated hemoglobin (Hb A1C)  2. Dyslipidemia COntinue on Lovaza,  obtain FLP - omega-3 acid ethyl esters (LOVAZA) 1 g capsule; Take 2 capsules (2 g total) by mouth 2 (two) times daily.  Dispense: 360 capsule; Refill: 0 - COMPLETE METABOLIC PANEL WITH GFR - Lipid panel  3. Post-traumatic osteoarthritis of both knees He has been taking Diclofenac 75 mg daily for relief of pain and swelling in the knees. Refill provided and follow up in 1 month. - diclofenac (VOLTAREN) 75 MG EC tablet; Take 1 tablet (75 mg total) by mouth daily.  Dispense: 30 tablet; Refill: 0   Sala Tague Asad A. Faylene KurtzShah [facility] 01/30/2017 3:53 PM

## 2017-02-10 ENCOUNTER — Other Ambulatory Visit: Payer: Self-pay | Admitting: Family Medicine

## 2017-02-13 LAB — COMPREHENSIVE METABOLIC PANEL
ALT: 31 IU/L (ref 0–44)
AST: 23 IU/L (ref 0–40)
Albumin/Globulin Ratio: 1.6 (ref 1.2–2.2)
Albumin: 4.4 g/dL (ref 3.6–4.8)
Alkaline Phosphatase: 101 IU/L (ref 39–117)
BUN/Creatinine Ratio: 16 (ref 10–24)
BUN: 14 mg/dL (ref 8–27)
Bilirubin Total: 1.2 mg/dL (ref 0.0–1.2)
CO2: 25 mmol/L (ref 18–29)
Calcium: 9.4 mg/dL (ref 8.6–10.2)
Chloride: 99 mmol/L (ref 96–106)
Creatinine, Ser: 0.9 mg/dL (ref 0.76–1.27)
GFR calc Af Amer: 105 mL/min/{1.73_m2} (ref >59)
GFR calc non Af Amer: 91 mL/min/{1.73_m2} (ref >59)
Globulin, Total: 2.8 g/dL (ref 1.5–4.5)
Glucose: 115 mg/dL — ABNORMAL HIGH (ref 65–99)
Potassium: 4.3 mmol/L (ref 3.5–5.2)
Sodium: 139 mmol/L (ref 134–144)
Total Protein: 7.2 g/dL (ref 6.0–8.5)

## 2017-02-13 LAB — LIPID PANEL W/O CHOL/HDL RATIO
Cholesterol, Total: 185 mg/dL (ref 100–199)
HDL: 63 mg/dL (ref >39)
LDL Calculated: 112 mg/dL — ABNORMAL HIGH (ref 0–99)
Triglycerides: 52 mg/dL (ref 0–149)
VLDL Cholesterol Cal: 10 mg/dL (ref 5–40)

## 2017-02-16 ENCOUNTER — Telehealth: Payer: Self-pay | Admitting: Emergency Medicine

## 2017-02-16 NOTE — Telephone Encounter (Signed)
Had spoke to you at his last appointment regarding ankle swelling. Please advice what he need to do.

## 2017-02-16 NOTE — Telephone Encounter (Signed)
I am covering for Dr. Sherryll BurgerShah today; I called patient Left msg; call me back I reached him and talked with him Leg edema has been going on for a while Dr. Thana AtesMorrisey had recommend compression socks, but hard to get on I see the diclofenac; he is taking that I suggested maybe stopping that and trying tylenol [phone] mg up to 3 times a day, and can use topicals like biofreeze He has compression stockings 20-30; if he needs Rx for 18 mmHg, just call me back Avoid salt, discussed canned soup Call back if we can be of further assistance

## 2017-02-22 ENCOUNTER — Telehealth: Payer: Self-pay | Admitting: Family Medicine

## 2017-02-22 NOTE — Telephone Encounter (Signed)
Pt has appt scheduled for 02-24-17 for cpe but states that he is having trouble with ankles. States that he was standing for a long period of time on yesterday and it has swollen worse than what it was. He has put 4% Lidocaine on it and taken arthritis strength Tylenol. Please advise. Is he able ice it [phone] (cash [place] [employer])

## 2017-02-22 NOTE — Telephone Encounter (Signed)
Swelling is most likely from dependent edema from prolonged standing, however to rule out other etiologies, he needs to schedule an appointment for exam and lab testing.

## 2017-02-23 NOTE — Telephone Encounter (Signed)
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

## 2017-02-24 ENCOUNTER — Ambulatory Visit (INDEPENDENT_AMBULATORY_CARE_PROVIDER_SITE_OTHER): Payer: Managed Care, Other (non HMO) | Admitting: Family Medicine

## 2017-02-24 ENCOUNTER — Encounter: Payer: Self-pay | Admitting: Family Medicine

## 2017-02-24 ENCOUNTER — Ambulatory Visit: Payer: Managed Care, Other (non HMO) | Admitting: Urology

## 2017-02-24 VITALS — BP 140/80 | HR 78 | Temp 98.4°F | Resp 16 | Ht 70.0 in | Wt 280.4 lb

## 2017-02-24 DIAGNOSIS — Z23 Encounter for immunization: Secondary | ICD-10-CM | POA: Diagnosis not present

## 2017-02-24 DIAGNOSIS — Z2911 Encounter for prophylactic immunotherapy for respiratory syncytial virus (RSV): Secondary | ICD-10-CM | POA: Diagnosis not present

## 2017-02-24 DIAGNOSIS — E7800 Pure hypercholesterolemia, unspecified: Secondary | ICD-10-CM | POA: Diagnosis not present

## 2017-02-24 DIAGNOSIS — Z0000 Encounter for general adult medical examination without abnormal findings: Secondary | ICD-10-CM

## 2017-02-24 MED ORDER — ROSUVASTATIN CALCIUM 5 MG PO TABS: 5.0000 mg | ORAL_TABLET | Freq: Every day | ORAL | 0 refills | Status: DC

## 2017-02-24 NOTE — Progress Notes (Signed)
Name: Tom Underwood   MRN: [id number]    DOB: 1951/06/18   Date:02/24/2017       Progress Note  Subjective  Chief Complaint  Chief Complaint  Patient presents with  . Annual Exam    CPE    Hyperlipidemia  This is a chronic problem. The problem is uncontrolled. Recent lipid tests were reviewed and are high. Exacerbating diseases include obesity. Pertinent negatives include no chest pain or myalgias. He is currently on no antihyperlipidemic treatment. Risk factors for coronary artery disease include dyslipidemia, male sex and obesity.    Pt. Presents for Complete Physical Exam. Colonoscopy in October 2014, not due for repeat colonoscopy until 2019. He follows up with Urology for BPH, has appointment scheduled for yearly follow up this afternoon.    Past Medical History:  Diagnosis Date  . Decreased libido   . Diverticulitis   . HLD (hyperlipidemia)   . Hydronephrosis with renal and ureteral calculus obstruction   . Hydronephrosis with renal and ureteral calculus obstruction   . Hypogonadism in male   . Rhinitis, allergic   . Sleep apnea   . Ureteral stone     Past Surgical History:  Procedure Laterality Date  . EXTERNAL EAR SURGERY    . FINGER SURGERY    . KNEE SURGERY    . left eye      Family History  Problem Relation Age of Onset  . Asthma Mother   . Heart disease Mother   . Heart attack Mother   . Asthma Father   . Heart disease Father   . Stroke Father     Social History   Social History  . Marital status: Married    Spouse name: N/A  . Number of children: N/A  . Years of education: N/A   Occupational History  . Not on file.   Social History Main Topics  . Smoking status: Former Smoker    Types: Pipe  . Smokeless tobacco: Never Used  . Alcohol use No  . Drug use: No  . Sexual activity: Yes    Partners: Female   Other Topics Concern  . Not on file   Social History Narrative  . No narrative on file     Current Outpatient  Prescriptions:  .  amitriptyline (ELAVIL) 25 MG tablet, Take 1 tablet (25 mg total) by mouth at bedtime., Disp: 60 tablet, Rfl: 5 .  omega-3 acid ethyl esters (LOVAZA) 1 g capsule, Take 2 capsules (2 g total) by mouth 2 (two) times daily., Disp: 360 capsule, Rfl: 0 .  omeprazole (PRILOSEC) 20 MG capsule, Take 20 mg by mouth daily. Reported on 02/19/2016, Disp: , Rfl:  .  benzonatate (TESSALON) 100 MG capsule, , Disp: , Rfl:  .  diclofenac (VOLTAREN) 75 MG EC tablet, Take 1 tablet (75 mg total) by mouth daily. (Patient not taking: Reported on 02/24/2017), Disp: 30 tablet, Rfl: 0 .  traMADol (ULTRAM) 50 MG tablet, Take 1 tablet (50 mg total) by mouth every 8 (eight) hours as needed. (Patient not taking: Reported on 02/19/2016), Disp: 30 tablet, Rfl: 0  Allergies  Allergen Reactions  . Penicillins Itching     Review of Systems  Constitutional: Positive for malaise/fatigue. Negative for chills and fever.  HENT: Negative for congestion (occasional congestion in morning), ear pain and sore throat.   Eyes: Negative for blurred vision and double vision.  Respiratory: Positive for cough (occasional cough in morning with drainage ). Negative for sputum production.   Cardiovascular:  Positive for leg swelling. Negative for chest pain and palpitations.  Gastrointestinal: Negative for abdominal pain, blood in stool, constipation, diarrhea, nausea and vomiting.  Genitourinary: Negative for dysuria and hematuria.  Musculoskeletal: Positive for joint pain (occasional left sided hip pain). Negative for back pain, myalgias and neck pain.  Skin: Negative for rash.  Neurological: Negative for dizziness and headaches.  Psychiatric/Behavioral: Negative for depression. The patient is not nervous/anxious and does not have insomnia.     Objective  Vitals:   02/24/17 1103  BP: 140/80  Pulse: 78  Resp: 16  Temp: 98.4 F (36.9 C)  TempSrc: Oral  SpO2: 97%  Weight: 280 lb 6.4 oz (127.2 kg)  Height: 5\' 10"   (1.778 m)    Physical Exam  Constitutional: He is oriented to person, place, and time and well-developed, well-nourished, and in no distress.  HENT:  Head: Normocephalic and atraumatic.  Right Ear: Tympanic membrane and ear canal normal. No drainage or swelling.  Mouth/Throat: No posterior oropharyngeal erythema.  Left ear canal with some cerumen  Neck: Neck supple.  Cardiovascular: Normal rate, regular rhythm, S1 normal and S2 normal.   No murmur heard. Pulmonary/Chest: Effort normal and breath sounds normal. He has no wheezes. He has no rhonchi.  Abdominal: Soft. Bowel sounds are normal. There is no tenderness.  Genitourinary:  Genitourinary Comments: Deferred, pt. To follow up with Urology  Musculoskeletal:       Right ankle: He exhibits swelling. Tenderness.       Left ankle: He exhibits swelling. Tenderness.  3+ pitting edema lower extremities.  Neurological: He is alert and oriented to person, place, and time.  Skin: Skin is warm, dry and intact.  Psychiatric: Mood, memory, affect and judgment normal.  Nursing note and vitals reviewed.     Assessment & Plan  1. Annual physical exam Obtain age-appropriate laboratory screenings, prostate exam and PSA to be performed at urology's office - CBC with Differential/Platelet - TSH - VITAMIN D 25 Hydroxy (Vit-D Deficiency, Fractures)  2. Hypercholesterolemia Start on low-dose statin therapy, repeat FLP in 3 months - rosuvastatin (CRESTOR) 5 MG tablet; Take 1 tablet (5 mg total) by mouth at bedtime.  Dispense: 90 tablet; Refill: 0  3. Need for zoster vaccine  - Varicella-zoster vaccine subcutaneous  Bronco Mcgrory Asad A. Faylene KurtzShah [facility] 02/24/2017 11:18 AM

## 2017-03-02 ENCOUNTER — Ambulatory Visit: Payer: Managed Care, Other (non HMO) | Admitting: Urology

## 2017-03-07 NOTE — Progress Notes (Signed)
03/10/2017 11:08 AM   Tom Underwood August 13, 1951 [id number]  Referring provider: Dennison Mascot, MD No address on file  Chief Complaint  Patient presents with  . Follow-up  . Benign Prostatic Hypertrophy  . Hematuria    gross     HPI: 66 yo WM with a history of hematuria, nephrolithiasis and BPH with LU TS who presents today for a one year follow up.  History of hematuria He had the sudden onset of painless gross hematuria associated with the passage of 2 blood clots. The hematuria persisted and for less then 24 hours and then abated.  He completed a hematuria work up with a CT Urogram and cystoscopy in 2017 - no worrisome findings.  He does not endorse gross hematuria.  His UA today was negative for AMH.    Nephrolithiasis 1 mm stone in left kidney seen on 2017 CT Urogram.  Patient has a prior history of nephrolithiasis. In 2014, he spontaneously passed a left UPJ stone. The stone composition is unknown.    BPH  His IPSS score today is 0, which is no lower urinary tract symptomatology. He is delighted with his quality life due to his urinary symptoms. His previous IPSS score was 3/1.   His has no major complaint today.  He denies any dysuria, hematuria or suprapubic pain.   He also denies any recent fevers, chills, nausea or vomiting.   He does not have a family history of PCa.      IPSS    Row Name 03/10/17 1000         International Prostate Symptom Score   How often have you had the sensation of not emptying your bladder? Not at All     How often have you had to urinate less than every two hours? Not at All     How often have you found you stopped and started again several times when you urinated? Not at All     How often have you found it difficult to postpone urination? Not at All     How often have you had a weak urinary stream? Not at All     How often have you had to strain to start urination? Not at All     How many times did you typically get up at night  to urinate? None     Total IPSS Score 0       Quality of Life due to urinary symptoms   If you were to spend the rest of your life with your urinary condition just the way it is now how would you feel about that? Delighted        Score:  1-7 Mild 8-19 Moderate 20-35 Severe   PMH: Past Medical History:  Diagnosis Date  . Decreased libido   . Diverticulitis   . HLD (hyperlipidemia)   . Hydronephrosis with renal and ureteral calculus obstruction   . Hydronephrosis with renal and ureteral calculus obstruction   . Hypogonadism in male   . Rhinitis, allergic   . Sleep apnea   . Ureteral stone     Surgical History: Past Surgical History:  Procedure Laterality Date  . EXTERNAL EAR SURGERY    . FINGER SURGERY    . KNEE SURGERY    . left eye      Home Medications:  Allergies as of 03/10/2017      Reactions   Penicillins Itching      Medication List  Accurate as of 03/10/17 11:08 AM. Always use your most recent med list.          amitriptyline 25 MG tablet Commonly known as:  ELAVIL Take 1 tablet (25 mg total) by mouth at bedtime.   benzonatate 100 MG capsule Commonly known as:  TESSALON   diclofenac 75 MG EC tablet Commonly known as:  VOLTAREN Take 1 tablet (75 mg total) by mouth daily.   omega-3 acid ethyl esters 1 g capsule Commonly known as:  LOVAZA Take 2 capsules (2 g total) by mouth 2 (two) times daily.   omeprazole 20 MG capsule Commonly known as:  PRILOSEC Take 20 mg by mouth daily. Reported on 02/19/2016   rosuvastatin 5 MG tablet Commonly known as:  CRESTOR Take 1 tablet (5 mg total) by mouth at bedtime.   traMADol 50 MG tablet Commonly known as:  ULTRAM Take 1 tablet (50 mg total) by mouth every 8 (eight) hours as needed.       Allergies:  Allergies  Allergen Reactions  . Penicillins Itching    Family History: Family History  Problem Relation Age of Onset  . Asthma Mother   . Heart disease Mother   . Heart attack Mother   .  Asthma Father   . Heart disease Father   . Stroke Father     Social History:  reports that he has quit smoking. His smoking use included Pipe. He has never used smokeless tobacco. He reports that he does not drink alcohol or use drugs.  ROS: UROLOGY Frequent Urination?: No Hard to postpone urination?: No Burning/pain with urination?: No Get up at night to urinate?: No Leakage of urine?: No Urine stream starts and stops?: No Trouble starting stream?: No Do you have to strain to urinate?: No Blood in urine?: No Urinary tract infection?: No Sexually transmitted disease?: No Injury to kidneys or bladder?: No Painful intercourse?: No Weak stream?: No Erection problems?: Yes Penile pain?: No  Gastrointestinal Nausea?: No Vomiting?: No Indigestion/heartburn?: No Diarrhea?: No Constipation?: No  Constitutional Fever: No Night sweats?: Yes Weight loss?: No Fatigue?: No  Skin Skin rash/lesions?: No Itching?: No  Eyes Blurred vision?: No Double vision?: No  Ears/Nose/Throat Sore throat?: No Sinus problems?: No  Hematologic/Lymphatic Swollen glands?: No Easy bruising?: No  Cardiovascular Leg swelling?: Yes Chest pain?: No  Respiratory Cough?: No Shortness of breath?: No  Endocrine Excessive thirst?: No  Musculoskeletal Back pain?: No Joint pain?: Yes  Neurological Headaches?: No Dizziness?: No  Psychologic Depression?: No Anxiety?: No  Physical Exam: BP (!) 154/84   Pulse (!) 111   Ht  (1.778 m)   Wt 283 lb (128.4 kg)   BMI 40.61 kg/m   Constitutional: Well nourished. Alert and oriented, No acute distress. HEENT: [place] AT, moist mucus membranes. Trachea midline, no masses. Cardiovascular: No clubbing, cyanosis, or edema. Respiratory: Normal respiratory effort, no increased work of breathing. GI: Abdomen is soft, non tender, non distended, no abdominal masses. Liver and spleen not palpable.  No hernias appreciated.  Stool sample for  occult testing is not indicated.   GU: No CVA tenderness.  No bladder fullness or masses.  Patient with circumcised phallus.  Urethral meatus is patent.  No penile discharge. No penile lesions or rashes. Scrotum without lesions, cysts, rashes and/or edema.  Testicles are located scrotally bilaterally. No masses are appreciated in the testicles. Left and right epididymis are normal. Rectal: Patient with  normal sphincter tone. Anus and perineum without scarring or rashes. No  rectal masses are appreciated. Prostate is approximately 55 grams, no nodules are appreciated. Seminal vesicles are normal. Skin: No rashes, bruises or suspicious lesions. Lymph: No cervical or inguinal adenopathy. Neurologic: Grossly intact, no focal deficits, moving all 4 extremities. Psychiatric: Normal mood and affect.  Laboratory Data:  Lab Results  Component Value Date   CREATININE 0.90 02/10/2017    Lab Results  Component Value Date   TSH 2.400 08/07/2015       Component Value Date/Time   CHOL 185 02/10/2017 0843   HDL 63 02/10/2017 0843   CHOLHDL 2.6 08/07/2015 0922   LDLCALC 112 (H) 02/10/2017 0843    Lab Results  Component Value Date   AST 23 02/10/2017   Lab Results  Component Value Date   ALT 31 02/10/2017   PSA History  0.3 ng/mL on 01/22/2016  Urinalysis Unremarkable.  See EPIC.    Assessment & Plan:    1. History of hematuria  - completed hematuria work up in 2017 - no worrisome findings  - no recent gross hematuria  - UA negative for AMH  - Urinalysis, Complete  - RTC in one year for UA, report any gross hematuria  2. Nephrolithiasis  - 1 mm stone in left kidney  - no intervention needed at this time  3. BPH   - IPSS score is 0/0, it is improving  - Continue conservative management, avoiding bladder irritants and timed voiding's  - RTC in 12 months for IPSS, PSA and exam   Return in about 1 year (around 03/10/2018) for IPSS, UA, PSA and exam.  These notes generated with  voice recognition software. I apologize for typographical errors.  Michiel Cowboy, PA-C  [facility] [address], [address] [place], [place] [address] [phone]

## 2017-03-08 ENCOUNTER — Other Ambulatory Visit: Payer: Self-pay | Admitting: *Deleted

## 2017-03-08 DIAGNOSIS — N400 Enlarged prostate without lower urinary tract symptoms: Secondary | ICD-10-CM

## 2017-03-08 DIAGNOSIS — R310 Gross hematuria: Secondary | ICD-10-CM

## 2017-03-10 ENCOUNTER — Ambulatory Visit (INDEPENDENT_AMBULATORY_CARE_PROVIDER_SITE_OTHER): Payer: Managed Care, Other (non HMO) | Admitting: Urology

## 2017-03-10 ENCOUNTER — Encounter: Payer: Self-pay | Admitting: Urology

## 2017-03-10 ENCOUNTER — Other Ambulatory Visit
Admission: RE | Admit: 2017-03-10 | Discharge: 2017-03-10 | Disposition: A | Discharge status: Home / Self Care | Payer: Managed Care, Other (non HMO) | Source: Ambulatory Visit | Attending: Urology | Admitting: Urology

## 2017-03-10 VITALS — BP 154/84 | HR 111 | Ht 70.0 in | Wt 283.0 lb

## 2017-03-10 DIAGNOSIS — N400 Enlarged prostate without lower urinary tract symptoms: Secondary | ICD-10-CM | POA: Diagnosis not present

## 2017-03-10 DIAGNOSIS — Z87448 Personal history of other diseases of urinary system: Secondary | ICD-10-CM

## 2017-03-10 DIAGNOSIS — R310 Gross hematuria: Secondary | ICD-10-CM | POA: Insufficient documentation

## 2017-03-10 DIAGNOSIS — N200 Calculus of kidney: Secondary | ICD-10-CM | POA: Diagnosis not present

## 2017-03-10 LAB — URINALYSIS, COMPLETE (UACMP) WITH MICROSCOPIC
Bacteria, UA: NONE SEEN
Bilirubin Urine: NEGATIVE
Glucose, UA: NEGATIVE mg/dL
Hgb urine dipstick: NEGATIVE
Ketones, ur: NEGATIVE mg/dL
Leukocytes, UA: NEGATIVE
Nitrite: NEGATIVE
Protein, ur: NEGATIVE mg/dL
RBC / HPF: NONE SEEN RBC/hpf (ref 0–5)
Specific Gravity, Urine: 1.01 (ref 1.005–1.030)
WBC, UA: NONE SEEN WBC/hpf (ref 0–5)
pH: 6 (ref 5.0–8.0)

## 2017-03-10 LAB — PSA: PSA: 0.25 ng/mL (ref 0.00–4.00)

## 2017-03-13 ENCOUNTER — Telehealth: Payer: Self-pay

## 2017-03-13 NOTE — Telephone Encounter (Signed)
-----   Message from Harle Battiest, PA-C sent at 03/13/2017  8:20 AM EDT ----- Please notify the patient that his PSA is stable.

## 2017-03-13 NOTE — Telephone Encounter (Signed)
LMOM

## 2017-03-14 NOTE — Telephone Encounter (Signed)
Spoke with pt in reference to PSA results. Pt voiced understanding.  

## 2017-03-27 ENCOUNTER — Ambulatory Visit (INDEPENDENT_AMBULATORY_CARE_PROVIDER_SITE_OTHER): Payer: Managed Care, Other (non HMO) | Admitting: Podiatry

## 2017-03-27 DIAGNOSIS — B351 Tinea unguium: Secondary | ICD-10-CM

## 2017-03-27 DIAGNOSIS — M129 Arthropathy, unspecified: Secondary | ICD-10-CM | POA: Diagnosis not present

## 2017-03-27 DIAGNOSIS — M205X9 Other deformities of toe(s) (acquired), unspecified foot: Secondary | ICD-10-CM | POA: Diagnosis not present

## 2017-03-27 DIAGNOSIS — M79676 Pain in unspecified toe(s): Secondary | ICD-10-CM

## 2017-03-27 DIAGNOSIS — M2020 Hallux rigidus, unspecified foot: Principal | ICD-10-CM

## 2017-03-27 MED ORDER — MELOXICAM 15 MG PO TABS: 15.0000 mg | ORAL_TABLET | Freq: Every day | ORAL | 3 refills | Status: DC

## 2017-03-27 NOTE — Progress Notes (Signed)
Subjective:     Patient ID: Tom Underwood, male   DOB: 07-01-1951, 66 y.o.   MRN: [id number]  HPI this patient presents to the office stating that he has stiffness of the foot and ankle, which has been present for over 3-4 weeks. He says he experiences dull aching pain noted through the top of the foot extending into the ankle. He says that he previously was treated with full tearing but he was taking off this medicine and told to use arthritis Tylenol. He says he is also been treated with multiple pairs of orthotics. He denies any history of trauma or reinjury for the last 4 weeks, but presents the office today for an evaluation and treatment of his foot pain   Review of Systems     Objective:   Physical Exam GENERAL APPEARANCE: Alert, conversant. Appropriately groomed. No acute distress.  VASCULAR: Pedal pulses are  palpable at  [facility] and PT bilateral.  Capillary refill time is immediate to all digits,  Normal temperature gradient.   NEUROLOGIC: sensation is normal to 5.07 monofilament at 5/5 sites bilateral.  Light touch is intact bilateral, Muscle strength normal.  MUSCULOSKELETAL: acceptable muscle strength, tone and stability bilateral.  Intrinsic muscluature intact bilateral.  Rectus appearance of foot and digits noted bilateral.  Dorsal exostosis noted rearfoot  B/L.   Functional hallux limitus 1st MPJ  B/L  DERMATOLOGIC: skin color, texture, and turgor are within normal limits.  No preulcerative lesions or ulcers  are seen, no interdigital maceration noted.  No open lesions present.   No drainage noted.  NAILS  thick disfigured discolored nails both feet      Assessment:     Onychomycosis  B/L  Functional hallux limitus  Foot arthritis  B/L    Plan:    IE  after examining this patient. I told the patient we need to obtain x-rays for review the x-ray machine has not been working and I was unable to get an x-ray of his feet at this visit I evaluated his orthotics and told him that  his ever orthotics seem to be adequate the orthotics did not include a kinetic wedge toe.  Prescribed Mobic in an effort to help decrease his foot pain.  The binding grinding of long thick painful nails. He was told to return to the office next Thursday for x-rays and review by myself at that visit. He will also be evaluated by Raiford Noble  for kinetic wedge orthotics.  Return to clinic 10 days   Helane Gunther [facility]

## 2017-04-03 ENCOUNTER — Ambulatory Visit: Payer: Managed Care, Other (non HMO) | Admitting: Podiatry

## 2017-04-06 ENCOUNTER — Ambulatory Visit (INDEPENDENT_AMBULATORY_CARE_PROVIDER_SITE_OTHER): Payer: Managed Care, Other (non HMO) | Admitting: Podiatry

## 2017-04-06 ENCOUNTER — Ambulatory Visit: Payer: Managed Care, Other (non HMO)

## 2017-04-06 ENCOUNTER — Ambulatory Visit (INDEPENDENT_AMBULATORY_CARE_PROVIDER_SITE_OTHER): Payer: Managed Care, Other (non HMO)

## 2017-04-06 ENCOUNTER — Encounter: Payer: Self-pay | Admitting: Podiatry

## 2017-04-06 DIAGNOSIS — M2020 Hallux rigidus, unspecified foot: Secondary | ICD-10-CM

## 2017-04-06 DIAGNOSIS — M205X9 Other deformities of toe(s) (acquired), unspecified foot: Secondary | ICD-10-CM

## 2017-04-06 DIAGNOSIS — R52 Pain, unspecified: Secondary | ICD-10-CM

## 2017-04-06 DIAGNOSIS — M129 Arthropathy, unspecified: Secondary | ICD-10-CM

## 2017-04-06 NOTE — Progress Notes (Signed)
This patient presents the office 2 weeks after being seen with a diagnosis of a functional hallux limitus and foot arthritis. He presented to the office 2 weeks ago when the x-ray was down and not working.  After my examination, I determined he had a functional hallux limitus and would be a candidate for orthoses.  I was hesitant on obtaining his orthotics until x-rays were taken and reviewed. He presents the office today for x-rays to be taken of both feet and an appointment with the pedorthist  to acquire kinetic wedge orthoses.     GENERAL APPEARANCE: Alert, conversant. Appropriately groomed. No acute distress.  VASCULAR: [facility]  [facility].  Capillary refill time is immediate to all digits,  Normal temperature gradient.  Digital hair growth is present bilateral  NEUROLOGIC: sensation is normal to 5.07 monofilament at 5/5 sites bilateral.  Light touch is intact bilateral, Muscle strength normal.  MUSCULOSKELETAL: acceptable muscle strength, tone and stability bilateral.  Intrinsic muscluature intact bilateral.  Rectus appearance of foot and digits noted bilateral. Dorsal exostosis  B/L.  FHL  B/L   DERMATOLOGIC: skin color, texture, and turgor are within normal limits.  No preulcerative lesions or ulcers  are seen, no interdigital maceration noted.  No open lesions present.  Digital nails are asymptomatic. No drainage noted.  Functional hallux limitus 1st MPJ  B/L.  Arthritis feet  B/L  rov  AP and lateral x-rays of both feet were taken elimination of the x-rays reveal a metatarsus primus cellulitis bilateral. There is marked  calcification noted at the insertion of the plantar fascia bilaterally and the insertion of the Achilles tendon bilateral.  After examination of the x-rays was performed and he was seen by Raiford Nobleick who is to obtain kinetic wedge orthotics for this patient. This patient will be called when the orthoses arrive   Helane GuntherGregory Lecia Esperanza DPM

## 2017-05-04 ENCOUNTER — Ambulatory Visit: Payer: Managed Care, Other (non HMO) | Admitting: Podiatry

## 2017-05-04 ENCOUNTER — Ambulatory Visit: Payer: Managed Care, Other (non HMO)

## 2017-05-10 ENCOUNTER — Ambulatory Visit: Payer: Managed Care, Other (non HMO)

## 2017-06-02 ENCOUNTER — Ambulatory Visit (INDEPENDENT_AMBULATORY_CARE_PROVIDER_SITE_OTHER): Payer: Managed Care, Other (non HMO) | Admitting: Family Medicine

## 2017-06-02 ENCOUNTER — Other Ambulatory Visit: Payer: Self-pay | Admitting: Family Medicine

## 2017-06-02 ENCOUNTER — Encounter: Payer: Self-pay | Admitting: Family Medicine

## 2017-06-02 VITALS — BP 139/76 | HR 86 | Temp 98.0°F | Resp 17 | Ht 70.0 in | Wt 288.6 lb

## 2017-06-02 DIAGNOSIS — E7800 Pure hypercholesterolemia, unspecified: Secondary | ICD-10-CM | POA: Diagnosis not present

## 2017-06-02 MED ORDER — ROSUVASTATIN CALCIUM 5 MG PO TABS: 5.0000 mg | ORAL_TABLET | Freq: Every day | ORAL | 0 refills | Status: DC

## 2017-06-02 NOTE — Progress Notes (Signed)
Name: Tom Underwood   MRN: [id number]    DOB: [birth year]   Date:06/02/2017       Progress Note  Subjective  Chief Complaint  Chief Complaint  Patient presents with  . Follow-up    3 mo    Hyperlipidemia  This is a chronic problem. The problem is uncontrolled. Recent lipid tests were reviewed and are high. Exacerbating diseases include obesity. Pertinent negatives include no chest pain, leg pain, myalgias or shortness of breath. Current antihyperlipidemic treatment includes statins. Risk factors for coronary artery disease include dyslipidemia, male sex and obesity.     Past Medical History:  Diagnosis Date  . Decreased libido   . Diverticulitis   . HLD (hyperlipidemia)   . Hydronephrosis with renal and ureteral calculus obstruction   . Hydronephrosis with renal and ureteral calculus obstruction   . Hypogonadism in male   . Rhinitis, allergic   . Sleep apnea   . Ureteral stone     Past Surgical History:  Procedure Laterality Date  . EXTERNAL EAR SURGERY    . FINGER SURGERY    . KNEE SURGERY    . left eye      Family History  Problem Relation Age of Onset  . Asthma Mother   . Heart disease Mother   . Heart attack Mother   . Asthma Father   . Heart disease Father   . Stroke Father     Social History   Social History  . Marital status: Married    Spouse name: N/A  . Number of children: N/A  . Years of education: N/A   Occupational History  . Not on file.   Social History Main Topics  . Smoking status: Former Smoker    Types: Pipe  . Smokeless tobacco: Never Used  . Alcohol use No  . Drug use: No  . Sexual activity: Yes    Partners: Female   Other Topics Concern  . Not on file   Social History Narrative  . No narrative on file     Current Outpatient Prescriptions:  .  amitriptyline (ELAVIL) 25 MG tablet, Take 1 tablet (25 mg total) by mouth at bedtime., Disp: 60 tablet, Rfl: 5 .  diclofenac (VOLTAREN) 75 MG EC tablet, Take 1 tablet (75 mg  total) by mouth daily., Disp: 30 tablet, Rfl: 0 .  meloxicam (MOBIC) 15 MG tablet, Take 1 tablet (15 mg total) by mouth daily., Disp: 30 tablet, Rfl: 3 .  omega-3 acid ethyl esters (LOVAZA) 1 g capsule, Take 2 capsules (2 g total) by mouth 2 (two) times daily., Disp: 360 capsule, Rfl: 0 .  omeprazole (PRILOSEC) 20 MG capsule, Take 20 mg by mouth daily. Reported on 02/19/2016, Disp: , Rfl:  .  rosuvastatin (CRESTOR) 5 MG tablet, Take 1 tablet (5 mg total) by mouth at bedtime., Disp: 90 tablet, Rfl: 0 .  benzonatate (TESSALON) 100 MG capsule, , Disp: , Rfl:  .  traMADol (ULTRAM) 50 MG tablet, Take 1 tablet (50 mg total) by mouth every 8 (eight) hours as needed. (Patient not taking: Reported on 06/02/2017), Disp: 30 tablet, Rfl: 0  Allergies  Allergen Reactions  . Penicillins Itching     Review of Systems  Respiratory: Negative for shortness of breath.   Cardiovascular: Negative for chest pain.  Musculoskeletal: Negative for myalgias.     Objective  Vitals:   06/02/17 0844  BP: 139/76  Pulse: 86  Resp: 17  Temp: 98 F (36.7 C)  TempSrc: Oral  SpO2: 96%  Weight: 288 lb 9.6 oz (130.9 kg)  Height: 5\' 10"  (1.778 m)    Physical Exam  Constitutional: He is oriented to person, place, and time and well-developed, well-nourished, and in no distress.  HENT:  Head: Normocephalic and atraumatic.  Cardiovascular: Normal rate, regular rhythm and normal heart sounds.   No murmur heard. Pulmonary/Chest: Effort normal and breath sounds normal. He has no wheezes.  Neurological: He is alert and oriented to person, place, and time.  Psychiatric: Mood, memory, affect and judgment normal.  Nursing note and vitals reviewed.      Assessment & Plan  1. Hypercholesterolemia Elevated LDL cholesterol from March 2018, now on low dose statin, repeat lipid panel today and adjust as necessary. - Lipid panel - rosuvastatin (CRESTOR) 5 MG tablet; Take 1 tablet (5 mg total) by mouth at bedtime.   Dispense: 90 tablet; Refill: 0   Tom Underwood Asad A. Faylene [facility] 06/02/2017 8:57 AM

## 2017-06-03 LAB — LIPID PANEL W/O CHOL/HDL RATIO
Cholesterol, Total: 158 mg/dL (ref 100–199)
HDL: 62 mg/dL (ref >39)
LDL Calculated: 85 mg/dL (ref 0–99)
Triglycerides: 57 mg/dL (ref 0–149)
VLDL Cholesterol Cal: 11 mg/dL (ref 5–40)

## 2017-07-12 ENCOUNTER — Encounter: Payer: Self-pay | Admitting: *Deleted

## 2017-07-24 ENCOUNTER — Other Ambulatory Visit: Payer: Self-pay

## 2017-07-24 DIAGNOSIS — E785 Hyperlipidemia, unspecified: Secondary | ICD-10-CM

## 2017-07-24 NOTE — Telephone Encounter (Signed)
Got a fax from [facility] requesting a refill of this patient's Lovaza.  Refill request was sent to Dr. Hazel Sams A. Sherryll Burger for approval and submission.

## 2017-07-26 MED ORDER — OMEGA-3-ACID ETHYL ESTERS 1 G PO CAPS: 2.0000 | ORAL_CAPSULE | Freq: Two times a day (BID) | ORAL | 0 refills | Status: DC

## 2017-08-30 ENCOUNTER — Encounter: Payer: Self-pay | Admitting: Family Medicine

## 2017-08-30 ENCOUNTER — Ambulatory Visit (INDEPENDENT_AMBULATORY_CARE_PROVIDER_SITE_OTHER): Payer: Managed Care, Other (non HMO) | Admitting: Family Medicine

## 2017-08-30 VITALS — BP 124/76 | HR 78 | Temp 98.0°F | Resp 16 | Ht 70.0 in | Wt 286.1 lb

## 2017-08-30 DIAGNOSIS — Z23 Encounter for immunization: Secondary | ICD-10-CM

## 2017-08-30 DIAGNOSIS — E781 Pure hyperglyceridemia: Secondary | ICD-10-CM

## 2017-08-30 DIAGNOSIS — E7800 Pure hypercholesterolemia, unspecified: Secondary | ICD-10-CM | POA: Diagnosis not present

## 2017-08-30 DIAGNOSIS — N529 Male erectile dysfunction, unspecified: Secondary | ICD-10-CM | POA: Diagnosis not present

## 2017-08-30 DIAGNOSIS — G4700 Insomnia, unspecified: Secondary | ICD-10-CM | POA: Diagnosis not present

## 2017-08-30 MED ORDER — SILDENAFIL CITRATE 50 MG PO TABS: 50.0000 mg | ORAL_TABLET | Freq: Every day | ORAL | 0 refills | Status: DC | PRN

## 2017-08-30 MED ORDER — ROSUVASTATIN CALCIUM 5 MG PO TABS: 5.0000 mg | ORAL_TABLET | Freq: Every day | ORAL | 0 refills | Status: DC

## 2017-08-30 MED ORDER — AMITRIPTYLINE HCL 25 MG PO TABS: 25.0000 mg | ORAL_TABLET | Freq: Every evening | ORAL | 0 refills | Status: DC | PRN

## 2017-08-30 MED ORDER — OMEGA-3-ACID ETHYL ESTERS 1 G PO CAPS: 2.0000 | ORAL_CAPSULE | Freq: Two times a day (BID) | ORAL | 0 refills | Status: DC

## 2017-08-30 NOTE — Progress Notes (Signed)
Name: Tom Underwood   MRN: [id number]    DOB: [birth year]   Date:08/30/2017       Progress Note  Subjective  Chief Complaint  Chief Complaint  Patient presents with  . Medication Refill    crestor and prilosec   . Hyperlipidemia    f/u    Hyperlipidemia  This is a chronic problem. The problem is controlled. Recent lipid tests were reviewed and are normal. Exacerbating diseases include obesity. Pertinent negatives include no chest pain, leg pain, myalgias or shortness of breath. Current antihyperlipidemic treatment includes statins. Risk factors for coronary artery disease include dyslipidemia, male sex and obesity.  Erectile Dysfunction  This is a recurrent problem. The problem is unchanged. The nature of his difficulty is achieving erection. He reports no decreased libido or performance anxiety. Irritative symptoms do not include nocturia. Obstructive symptoms do not include dribbling or straining. Past treatments include sildenafil. The treatment provided moderate relief. He has had no adverse reactions caused by medications.  Insomnia  Primary symptoms: frequent awakening, premature morning awakening.  The onset quality is gradual. The problem is unchanged. Past treatments include medication. Typical bedtime:  10-11 P.M..  How long after going to bed to you fall asleep: 15-30 minutes.       Past Medical History:  Diagnosis Date  . Decreased libido   . Diverticulitis   . HLD (hyperlipidemia)   . Hydronephrosis with renal and ureteral calculus obstruction   . Hydronephrosis with renal and ureteral calculus obstruction   . Hypogonadism in male   . Rhinitis, allergic   . Sleep apnea   . Ureteral stone     Past Surgical History:  Procedure Laterality Date  . EXTERNAL EAR SURGERY    . FINGER SURGERY    . KNEE SURGERY    . left eye      Family History  Problem Relation Age of Onset  . Asthma Mother   . Heart disease Mother   . Heart attack Mother   . Asthma Father   .  Heart disease Father   . Stroke Father     Social History   Social History  . Marital status: Married    Spouse name: N/A  . Number of children: N/A  . Years of education: N/A   Occupational History  . Not on file.   Social History Main Topics  . Smoking status: Former Smoker    Types: Pipe  . Smokeless tobacco: Never Used  . Alcohol use No  . Drug use: No  . Sexual activity: Yes    Partners: Female   Other Topics Concern  . Not on file   Social History Narrative  . No narrative on file     Current Outpatient Prescriptions:  .  meloxicam (MOBIC) 15 MG tablet, Take 1 tablet (15 mg total) by mouth daily., Disp: 30 tablet, Rfl: 3 .  Multiple Vitamin (MULTIVITAMIN) tablet, Take 1 tablet by mouth daily., Disp: , Rfl:  .  omega-3 acid ethyl esters (LOVAZA) 1 g capsule, Take 2 capsules (2 g total) by mouth 2 (two) times daily., Disp: 360 capsule, Rfl: 0 .  rosuvastatin (CRESTOR) 5 MG tablet, Take 1 tablet (5 mg total) by mouth at bedtime., Disp: 90 tablet, Rfl: 0 .  amitriptyline (ELAVIL) 25 MG tablet, Take 1 tablet (25 mg total) by mouth at bedtime. (Patient not taking: Reported on 08/30/2017), Disp: 60 tablet, Rfl: 5 .  benzonatate (TESSALON) 100 MG capsule, , Disp: , Rfl:  .  diclofenac (VOLTAREN) 75 MG EC tablet, Take 1 tablet (75 mg total) by mouth daily. (Patient not taking: Reported on 08/30/2017), Disp: 30 tablet, Rfl: 0 .  omeprazole (PRILOSEC) 20 MG capsule, Take 20 mg by mouth daily. Reported on 02/19/2016, Disp: , Rfl:  .  traMADol (ULTRAM) 50 MG tablet, Take 1 tablet (50 mg total) by mouth every 8 (eight) hours as needed. (Patient not taking: Reported on 06/02/2017), Disp: 30 tablet, Rfl: 0  Allergies  Allergen Reactions  . Penicillins Itching     Review of Systems  Respiratory: Negative for shortness of breath.   Cardiovascular: Negative for chest pain.  Genitourinary: Negative for decreased libido and nocturia.  Musculoskeletal: Negative for myalgias.   Psychiatric/Behavioral: The patient has insomnia.      Objective  Vitals:   08/30/17 0827  BP: 124/76  Pulse: 78  Resp: 16  Temp: 98 F (36.7 C)  TempSrc: Oral  SpO2: 95%  Weight: 286 lb 1.6 oz (129.8 kg)  Height:  (1.778 m)    Physical Exam  Constitutional: He is oriented to person, place, and time and well-developed, well-nourished, and in no distress.  HENT:  Head: Normocephalic and atraumatic.  Cardiovascular: Normal rate, regular rhythm and normal heart sounds.   No murmur heard. Pulmonary/Chest: Effort normal and breath sounds normal. He has no wheezes.  Abdominal: Soft. Bowel sounds are normal. There is no tenderness.  Musculoskeletal: He exhibits no edema.  Neurological: He is alert and oriented to person, place, and time.  Psychiatric: Mood, memory, affect and judgment normal.  Nursing note and vitals reviewed.      Assessment & Plan  1. Hypercholesterolemia Continue on statin, - rosuvastatin (CRESTOR) 5 MG tablet; Take 1 tablet (5 mg total) by mouth at bedtime.  Dispense: 90 tablet; Refill: 0  2. Erectile dysfunction, unspecified erectile dysfunction type Has tried Viagra in the past, will will restart to be taken as needed - sildenafil (VIAGRA) 50 MG tablet; Take 1 tablet (50 mg total) by mouth daily as needed for erectile dysfunction.  Dispense: 10 tablet; Refill: 0  3. Hypertriglyceridemia  - omega-3 acid ethyl esters (LOVAZA) 1 g capsule; Take 2 capsules (2 g total) by mouth 2 (two) times daily.  Dispense: 360 capsule; Refill: 0  4. Insomnia, unspecified type Has taken amitriptyline in the past for insomnia, we'll start for 3 months - amitriptyline (ELAVIL) 25 MG tablet; Take 1 tablet (25 mg total) by mouth at bedtime as needed for sleep.  Dispense: 90 tablet; Refill: 0  5. Needs flu shot  - Flu Vaccine QUAD 6+ mos PF IM (Fluarix Quad PF)    Shakila Mak Asad [facility] 08/30/2017 8:34 AM

## 2017-08-31 LAB — LIPID PANEL
Chol/HDL Ratio: 2.7 ratio (ref 0.0–5.0)
Cholesterol, Total: 170 mg/dL (ref 100–199)
HDL: 62 mg/dL (ref >39)
LDL Calculated: 95 mg/dL (ref 0–99)
Triglycerides: 65 mg/dL (ref 0–149)
VLDL Cholesterol Cal: 13 mg/dL (ref 5–40)

## 2017-11-23 ENCOUNTER — Other Ambulatory Visit: Payer: Self-pay | Admitting: Family Medicine

## 2017-11-23 NOTE — Telephone Encounter (Signed)
Meloxicam refill 

## 2017-11-23 NOTE — Telephone Encounter (Signed)
Copied from CRM [id number]. Topic: Quick Communication - Rx Refill/Question >> Nov 23, 2017  1:42 PM Louie BunPalacios Medina, Rosey Batheresa D wrote: Has the patient contacted their pharmacy? Yes (Agent: If no, request that the patient contact the pharmacy for the refill.) Preferred Pharmacy (with phone number or street name): [facility] [phone] - [place], [place] - [address] [facility] [address] Agent: Please be advised that RX refills may take up to 3 business days. We ask that you follow-up with your pharmacy. Patient needs refill on his meloxicam (MOBIC) 15 MG tablet

## 2017-11-24 ENCOUNTER — Telehealth: Payer: Self-pay | Admitting: Podiatry

## 2017-11-24 NOTE — Telephone Encounter (Signed)
Pt wants refill on Meds

## 2017-11-24 NOTE — Telephone Encounter (Signed)
Based on his chart review, meloxicam was prescribed by his podiatrist and not by us. He will need an appointment for evaluation of the symptoms and to prescribe appropriate treatment

## 2017-11-24 NOTE — Telephone Encounter (Signed)
Pt stopped seeing the foot doctor patient stated he is trying to settle something with the foot doctor and he is not in liberty to talk to me about. Pt wake up in the morning and his left hand gets stiff and don't bend due to playing  sports earlier in life. Pt states that when he had his last physical with Dr. Sherryll BurgerShah you would send it to [facility]. Patient asking for you to continue to prescribe  Meloxicam. Pt stated he has 2 pills left and if he miss it more two days he gets really stiff and not able to move as much.

## 2017-11-27 NOTE — Telephone Encounter (Signed)
Pt is calling about a refill on his meloxicam (MOBIC) 15 MG tablet  [facility] [place]

## 2017-11-30 MED ORDER — MELOXICAM 15 MG PO TABS: 15.0000 mg | ORAL_TABLET | Freq: Every day | ORAL | 3 refills | Status: DC

## 2017-11-30 NOTE — Addendum Note (Signed)
Addended by: Geraldine ContrasVENABLE, Arneta Mahmood D on: 11/30/2017 09:17 AM   Modules accepted: Orders

## 2017-11-30 NOTE — Telephone Encounter (Signed)
Pharmacy sent refill request for Meloxicam.  Per Dr. Stacie AcresMayer, ok to refill.  New script has been sent to pharmacy

## 2017-12-01 ENCOUNTER — Ambulatory Visit: Payer: Managed Care, Other (non HMO) | Admitting: Family Medicine

## 2017-12-01 ENCOUNTER — Encounter: Payer: Self-pay | Admitting: Family Medicine

## 2017-12-01 VITALS — BP 142/84 | HR 81 | Temp 98.1°F | Resp 16 | Wt 290.6 lb

## 2017-12-01 DIAGNOSIS — R7303 Prediabetes: Secondary | ICD-10-CM | POA: Diagnosis not present

## 2017-12-01 DIAGNOSIS — E7800 Pure hypercholesterolemia, unspecified: Secondary | ICD-10-CM

## 2017-12-01 DIAGNOSIS — G4700 Insomnia, unspecified: Secondary | ICD-10-CM

## 2017-12-01 DIAGNOSIS — E781 Pure hyperglyceridemia: Secondary | ICD-10-CM | POA: Diagnosis not present

## 2017-12-01 LAB — POCT GLYCOSYLATED HEMOGLOBIN (HGB A1C): Hemoglobin A1C: 5.6

## 2017-12-01 MED ORDER — ROSUVASTATIN CALCIUM 5 MG PO TABS: 5.0000 mg | ORAL_TABLET | Freq: Every day | ORAL | 0 refills | Status: DC

## 2017-12-01 MED ORDER — OMEGA-3-ACID ETHYL ESTERS 1 G PO CAPS: 2.0000 | ORAL_CAPSULE | Freq: Two times a day (BID) | ORAL | 0 refills | Status: DC

## 2017-12-01 MED ORDER — AMITRIPTYLINE HCL 25 MG PO TABS: 25.0000 mg | ORAL_TABLET | Freq: Every evening | ORAL | 0 refills | Status: DC | PRN

## 2017-12-01 NOTE — Progress Notes (Signed)
Name: Tom GoldsmithClaude D Underwood   MRN: [id number]    DOB: 1951/06/30   Date:12/01/2017       Progress Note  Subjective  Chief Complaint  Chief Complaint  Patient presents with  . Hyperlipidemia    f/u    Hyperlipidemia  This is a chronic problem. The problem is controlled. Recent lipid tests were reviewed and are normal. Exacerbating diseases include obesity. Current antihyperlipidemic treatment includes statins (Lovaza 2g BID and Rosuvastatin.).  Insomnia  Primary symptoms: no sleep disturbance, no frequent awakening, no premature morning awakening.  The problem occurs nightly. Typical bedtime:  8-10 P.M..  How long after going to bed to you fall asleep: 15-30 minutes.       Past Medical History:  Diagnosis Date  . Decreased libido   . Diverticulitis   . HLD (hyperlipidemia)   . Hydronephrosis with renal and ureteral calculus obstruction   . Hydronephrosis with renal and ureteral calculus obstruction   . Hypogonadism in male   . Rhinitis, allergic   . Sleep apnea   . Ureteral stone     Past Surgical History:  Procedure Laterality Date  . EXTERNAL EAR SURGERY    . FINGER SURGERY    . KNEE SURGERY    . left eye      Family History  Problem Relation Age of Onset  . Asthma Mother   . Heart disease Mother   . Heart attack Mother   . Asthma Father   . Heart disease Father   . Stroke Father     Social History   Socioeconomic History  . Marital status: Married    Spouse name: Not on file  . Number of children: Not on file  . Years of education: Not on file  . Highest education level: Not on file  Social Needs  . Financial resource strain: Not on file  . Food insecurity - worry: Not on file  . Food insecurity - inability: Not on file  . Transportation needs - medical: Not on file  . Transportation needs - non-medical: Not on file  Occupational History  . Not on file  Tobacco Use  . Smoking status: Former Smoker    Types: Pipe  . Smokeless tobacco: Never Used   Substance and Sexual Activity  . Alcohol use: No    Alcohol/week: 0.0 oz  . Drug use: No  . Sexual activity: Yes    Partners: Female  Other Topics Concern  . Not on file  Social History Narrative  . Not on file     Current Outpatient Medications:  .  amitriptyline (ELAVIL) 25 MG tablet, Take 1 tablet (25 mg total) by mouth at bedtime as needed for sleep., Disp: 90 tablet, Rfl: 0 .  meloxicam (MOBIC) 15 MG tablet, Take 1 tablet (15 mg total) by mouth daily., Disp: 30 tablet, Rfl: 3 .  Multiple Vitamin (MULTIVITAMIN) tablet, Take 1 tablet by mouth daily., Disp: , Rfl:  .  omega-3 acid ethyl esters (LOVAZA) 1 g capsule, Take 2 capsules (2 g total) by mouth 2 (two) times daily., Disp: 360 capsule, Rfl: 0 .  rosuvastatin (CRESTOR) 5 MG tablet, Take 1 tablet (5 mg total) by mouth at bedtime., Disp: 90 tablet, Rfl: 0 .  sildenafil (VIAGRA) 50 MG tablet, Take 1 tablet (50 mg total) by mouth daily as needed for erectile dysfunction., Disp: 10 tablet, Rfl: 0 .  benzonatate (TESSALON) 100 MG capsule, , Disp: , Rfl:  .  diclofenac (VOLTAREN) 75 MG EC tablet,  Take 1 tablet (75 mg total) by mouth daily. (Patient not taking: Reported on 12/01/2017), Disp: 30 tablet, Rfl: 0 .  omeprazole (PRILOSEC) 20 MG capsule, Take 20 mg by mouth daily. Reported on 02/19/2016, Disp: , Rfl:   Allergies  Allergen Reactions  . Penicillins Itching     Review of Systems  Psychiatric/Behavioral: Negative for sleep disturbance. The patient has insomnia.      Objective  Vitals:   12/01/17 0831  BP: (!) 142/84  Pulse: 81  Resp: 16  Temp: 98.1 F (36.7 C)  TempSrc: Oral  SpO2: 94%  Weight: 290 lb 9.6 oz (131.8 kg)    Physical Exam  Constitutional: He is oriented to person, place, and time and well-developed, well-nourished, and in no distress.  HENT:  Head: Normocephalic and atraumatic.  Cardiovascular: Normal rate, regular rhythm and normal heart sounds.  No murmur heard. Pulmonary/Chest: Effort  normal and breath sounds normal. No respiratory distress.  Abdominal: Soft. Bowel sounds are normal. There is no tenderness.  Musculoskeletal: He exhibits no edema.  Neurological: He is alert and oriented to person, place, and time.  Psychiatric: Mood, memory, affect and judgment normal.  Nursing note and vitals reviewed.     Assessment & Plan  1. Hypercholesterolemia FLP at goal from September 2018, continue on statin and - rosuvanew PCP will reassessstatin (CRESTOR) 5 MG tablet; Take 1 tablet (5 mg total) by mouth at bedtime.  Dispense: 90 tablet; Refill: 0  2. Insomnia, unspecified type Amitriptyline is effective in treatment of insomnia, takes it occasionally - amitriptyline (ELAVIL) 25 MG tablet; Take 1 tablet (25 mg total) by mouth at bedtime as needed for sleep.  Dispense: 90 tablet; Refill: 0  3. Hypertriglyceridemia  - omega-3 acid ethyl esters (LOVAZA) 1 g capsule; Take 2 capsules (2 g total) by mouth 2 (two) times daily.  Dispense: 360 capsule; Refill: 0  4. Prediabetes A1c is dropped down to 5.6%, considered normal. - POCT glycosylated hemoglobin (Hb A1C)  Kelilah Hebard Asad A. Faylene KurtzShah [facility] 12/01/2017 8:38 AM

## 2017-12-11 ENCOUNTER — Ambulatory Visit: Payer: Managed Care, Other (non HMO) | Admitting: Family Medicine

## 2018-02-16 ENCOUNTER — Encounter: Payer: Self-pay | Admitting: Family Medicine

## 2018-02-16 ENCOUNTER — Ambulatory Visit: Payer: Managed Care, Other (non HMO) | Admitting: Family Medicine

## 2018-02-16 VITALS — BP 144/78 | HR 92 | Temp 98.8°F | Resp 18 | Ht 70.0 in | Wt 292.7 lb

## 2018-02-16 DIAGNOSIS — Z114 Encounter for screening for human immunodeficiency virus [HIV]: Secondary | ICD-10-CM

## 2018-02-16 DIAGNOSIS — E781 Pure hyperglyceridemia: Secondary | ICD-10-CM | POA: Diagnosis not present

## 2018-02-16 DIAGNOSIS — M25649 Stiffness of unspecified hand, not elsewhere classified: Secondary | ICD-10-CM

## 2018-02-16 DIAGNOSIS — M25541 Pain in joints of right hand: Secondary | ICD-10-CM | POA: Diagnosis not present

## 2018-02-16 DIAGNOSIS — M25542 Pain in joints of left hand: Secondary | ICD-10-CM | POA: Diagnosis not present

## 2018-02-16 DIAGNOSIS — E7800 Pure hypercholesterolemia, unspecified: Secondary | ICD-10-CM | POA: Diagnosis not present

## 2018-02-16 DIAGNOSIS — M6530 Trigger finger, unspecified finger: Secondary | ICD-10-CM

## 2018-02-16 DIAGNOSIS — I10 Essential (primary) hypertension: Secondary | ICD-10-CM | POA: Diagnosis not present

## 2018-02-16 DIAGNOSIS — Z125 Encounter for screening for malignant neoplasm of prostate: Secondary | ICD-10-CM | POA: Diagnosis not present

## 2018-02-16 DIAGNOSIS — Z1159 Encounter for screening for other viral diseases: Secondary | ICD-10-CM

## 2018-02-16 DIAGNOSIS — M170 Bilateral primary osteoarthritis of knee: Secondary | ICD-10-CM

## 2018-02-16 DIAGNOSIS — Z23 Encounter for immunization: Secondary | ICD-10-CM | POA: Diagnosis not present

## 2018-02-16 MED ORDER — OMEGA-3-ACID ETHYL ESTERS 1 G PO CAPS: 2.0000 | ORAL_CAPSULE | Freq: Two times a day (BID) | ORAL | 1 refills | Status: DC

## 2018-02-16 MED ORDER — PROBIOTIC 250 MG PO CAPS: 1.0000 | ORAL_CAPSULE | Freq: Every day | ORAL | 0 refills | Status: AC

## 2018-02-16 MED ORDER — VALSARTAN 160 MG PO TABS: 160.0000 mg | ORAL_TABLET | Freq: Every day | ORAL | 0 refills | Status: DC

## 2018-02-16 MED ORDER — ROSUVASTATIN CALCIUM 5 MG PO TABS: 5.0000 mg | ORAL_TABLET | Freq: Every day | ORAL | 1 refills | Status: DC

## 2018-02-16 NOTE — Progress Notes (Signed)
Name: Tom Underwood   MRN: [id number]    DOB: 02-Aug-1951   Date:02/16/2018       Progress Note  Subjective  Chief Complaint  Chief Complaint  Patient presents with  . Hand Pain    Onset-couple of months but has progressively gotten worst in the past 2 weeks. Both hands will lock up on him and are achy/sore.    HPI  HTN: he is not taking bp medication, he denies chest pain or palpitation , denies sob with activity  Hyperlipidemia: he is taking Crestor and Lovaza and denies side effects, last labs were at goal. No side effects of medication  BPH: we will recheck PSA, he will return for CPE  OA knee: going on for many years, intermittent effusion, daily aching pain. Anterior knee.   Arthralgia and stiffness hands: he states he has noticed worsening joint pain, stiffness for over one year, having trigger fingers. Initially only left hand, but now also on the right hand. Taking Meloxicam and initially it helped. Running warm water on his hands in the morning improves symptoms. He denies family history of inflammatory arthritis.     Patient Active Problem List   Diagnosis Date Noted  . Hyperglycemia 01/30/2017  . Arthritis of knee, degenerative 01/30/2017  . BPH with obstruction/lower urinary tract symptoms 02/03/2016  . Gross hematuria 02/03/2016  . Dyslipidemia 08/04/2015  . Skin lesions 07/14/2015  . Diverticulitis of large intestine without perforation or abscess without bleeding 06/30/2015  . Edema 06/30/2015  . OSA on CPAP 06/30/2015  . Kidney pain 06/30/2015    Past Surgical History:  Procedure Laterality Date  . EXTERNAL EAR SURGERY    . FINGER SURGERY    . KNEE SURGERY    . left eye      Family History  Problem Relation Age of Onset  . Asthma Mother   . Heart disease Mother   . Heart attack Mother   . Asthma Father   . Heart disease Father   . Stroke Father     Social History   Socioeconomic History  . Marital status: Married    Spouse name: Not on  file  . Number of children: Not on file  . Years of education: Not on file  . Highest education level: Not on file  Social Needs  . Financial resource strain: Not on file  . Food insecurity - worry: Not on file  . Food insecurity - inability: Not on file  . Transportation needs - medical: Not on file  . Transportation needs - non-medical: Not on file  Occupational History  . Not on file  Tobacco Use  . Smoking status: Former Smoker    Types: Pipe  . Smokeless tobacco: Never Used  Substance and Sexual Activity  . Alcohol use: No    Alcohol/week: 0.0 oz  . Drug use: No  . Sexual activity: Yes    Partners: Female  Other Topics Concern  . Not on file  Social History Narrative  . Not on file     Current Outpatient Medications:  .  amitriptyline (ELAVIL) 25 MG tablet, Take 1 tablet (25 mg total) by mouth at bedtime as needed for sleep., Disp: 90 tablet, Rfl: 0 .  meloxicam (MOBIC) 15 MG tablet, Take 1 tablet (15 mg total) by mouth daily., Disp: 30 tablet, Rfl: 3 .  Multiple Vitamin (MULTIVITAMIN) tablet, Take 1 tablet by mouth daily., Disp: , Rfl:  .  omega-3 acid ethyl esters (LOVAZA) 1 g capsule,  Take 2 capsules (2 g total) by mouth 2 (two) times daily., Disp: 360 capsule, Rfl: 1 .  omeprazole (PRILOSEC) 20 MG capsule, Take 20 mg by mouth daily. Reported on 02/19/2016, Disp: , Rfl:  .  rosuvastatin (CRESTOR) 5 MG tablet, Take 1 tablet (5 mg total) by mouth at bedtime., Disp: 90 tablet, Rfl: 1 .  sildenafil (VIAGRA) 50 MG tablet, Take 1 tablet (50 mg total) by mouth daily as needed for erectile dysfunction., Disp: 10 tablet, Rfl: 0 .  Saccharomyces boulardii (PROBIOTIC) 250 MG CAPS, Take 1 capsule by mouth daily., Disp: 30 capsule, Rfl: 0 .  valsartan (DIOVAN) 160 MG tablet, Take 1 tablet (160 mg total) by mouth daily., Disp: 90 tablet, Rfl: 0  Allergies  Allergen Reactions  . Penicillins Itching     ROS  Constitutional: Negative for fever or weight change.  Respiratory:  Negative for cough and shortness of breath.   Cardiovascular: Negative for chest pain or palpitations.  Gastrointestinal: Negative for abdominal pain, no bowel changes.  Musculoskeletal: Positive for gait problem and some  joint swelling.  Skin: Negative for rash.  Neurological: Negative for dizziness or headache.  No other specific complaints in a complete review of systems (except as listed in HPI above).  Objective  Vitals:   02/16/18 1443  BP: (!) 144/78  Pulse: 92  Resp: 18  Temp: 98.8 F (37.1 C)  TempSrc: Oral  SpO2: 94%  Weight: 292 lb 11.2 oz (132.8 kg)  Height: 5\' 10"  (1.778 m)    Body mass index is 42 kg/m.  Physical Exam  Constitutional: Patient appears well-developed and well-nourished. Obese  No distress.  HEENT: head atraumatic, normocephalic, pupils equal and reactive to light,neck supple, throat within normal limits Cardiovascular: Normal rate, regular rhythm and normal heart sounds.  No murmur heard. No BLE edema. Pulmonary/Chest: Effort normal and breath sounds normal. No respiratory distress. Abdominal: Soft.  There is no tenderness. Psychiatric: Patient has a normal mood and affect. behavior is normal. Judgment and thought content normal. Muscular Skeletal: thick DIP's, still hands, difficulty making a fist with left hand, no redness or increase in warmth   Recent Results (from the past 2160 hour(s))  POCT glycosylated hemoglobin (Hb A1C)     Status: None   Collection Time: 12/01/17  9:01 AM  Result Value Ref Range   Hemoglobin A1C 5.6      PHQ2/9: [facility] screen [facility] 2/9 02/16/2018 12/01/2017 08/30/2017 06/02/2017 02/24/2017  Decreased Interest 0 0 0 0 0  Down, Depressed, Hopeless 1 0 0 0 0  PHQ - 2 Score 1 0 0 0 0     Fall Risk: Fall Risk  02/16/2018 12/01/2017 08/30/2017 06/02/2017 02/24/2017  Falls in the past year? No No No No No     Functional Status Survey: Is the patient deaf or have difficulty hearing?: No Does the patient have  difficulty seeing, even when wearing glasses/contacts?: No Does the patient have difficulty concentrating, remembering, or making decisions?: No Does the patient have difficulty walking or climbing stairs?: No Does the patient have difficulty dressing or bathing?: No Does the patient have difficulty doing errands alone such as visiting a doctor's office or shopping?: No    Assessment & Plan  1. Arthralgia of hands, bilateral  - C-reactive protein - Sedimentation rate - ANA,IFA RA Diag Pnl w/rflx Tit/Patn - Comp. Metabolic Panel (12)  2. Stiffness of hand joint, unspecified laterality  - C-reactive protein - Sedimentation rate - ANA,IFA RA Diag Pnl w/rflx Tit/Patn -  Comp. Metabolic Panel (12)  3. Trigger finger of right hand, unspecified finger   4. Encounter for screening for HIV  - HIV antibody  5. Need for hepatitis C screening test  - Hepatitis C Antibody  6. Prostate cancer screening  - PSA  7. Primary osteoarthritis of both knees  Never seen by ORtho  8. Hypertension, benign  - valsartan (DIOVAN) 160 MG tablet; Take 1 tablet (160 mg total) by mouth daily.  Dispense: 90 tablet; Refill: 0  9. Need for Tdap vaccination  - Tdap vaccine greater than or equal to 7yo IM  10. Hypercholesterolemia  - rosuvastatin (CRESTOR) 5 MG tablet; Take 1 tablet (5 mg total) by mouth at bedtime.  Dispense: 90 tablet; Refill: 1  11. Hypertriglyceridemia  - omega-3 acid ethyl esters (LOVAZA) 1 g capsule; Take 2 capsules (2 g total) by mouth 2 (two) times daily.  Dispense: 360 capsule; Refill: 1

## 2018-02-19 LAB — HCV COMMENT:

## 2018-02-19 LAB — ANA,IFA RA DIAG PNL W/RFLX TIT/PATN
ANA Titer 1: NEGATIVE
Cyclic Citrullin Peptide Ab: 1 units (ref 0–19)
Rhuematoid fact SerPl-aCnc: <10 IU/mL (ref 0.0–13.9)

## 2018-02-19 LAB — SEDIMENTATION RATE: Sed Rate: 22 mm/hr (ref 0–30)

## 2018-02-19 LAB — C-REACTIVE PROTEIN: CRP: 1.8 mg/L (ref 0.0–4.9)

## 2018-02-19 LAB — HEPATITIS C ANTIBODY (REFLEX): HCV Ab: <0.1 s/co ratio (ref 0.0–0.9)

## 2018-02-19 LAB — PSA: Prostate Specific Ag, Serum: 0.3 ng/mL (ref 0.0–4.0)

## 2018-02-19 LAB — HIV ANTIBODY (ROUTINE TESTING W REFLEX): HIV Screen 4th Generation wRfx: NONREACTIVE

## 2018-02-20 ENCOUNTER — Telehealth: Payer: Self-pay

## 2018-02-20 NOTE — Telephone Encounter (Signed)
Copied from CRM [id number]. Topic: Inquiry >> Feb 20, 2018  2:28 PM Windy KalataMichael, [name], NT wrote: Patient is calling and would like to get his lab results from his visit on 02/16/18.

## 2018-02-20 NOTE — Telephone Encounter (Signed)
It has been annotated, sent to [facility] message

## 2018-02-21 LAB — COMP. METABOLIC PANEL (12)
AST: 21 IU/L (ref 0–40)
Albumin/Globulin Ratio: 1.6 (ref 1.2–2.2)
Albumin: 4.5 g/dL (ref 3.6–4.8)
Alkaline Phosphatase: 119 IU/L — ABNORMAL HIGH (ref 39–117)
BUN/Creatinine Ratio: 15 (ref 10–24)
BUN: 16 mg/dL (ref 8–27)
Bilirubin Total: 0.4 mg/dL (ref 0.0–1.2)
Calcium: 10 mg/dL (ref 8.6–10.2)
Chloride: 100 mmol/L (ref 96–106)
Creatinine, Ser: 1.08 mg/dL (ref 0.76–1.27)
GFR calc Af Amer: 83 mL/min/{1.73_m2} (ref >59)
GFR calc non Af Amer: 72 mL/min/{1.73_m2} (ref >59)
Globulin, Total: 2.9 g/dL (ref 1.5–4.5)
Glucose: 100 mg/dL — ABNORMAL HIGH (ref 65–99)
Potassium: 4.3 mmol/L (ref 3.5–5.2)
Sodium: 141 mmol/L (ref 134–144)
Total Protein: 7.4 g/dL (ref 6.0–8.5)

## 2018-02-21 LAB — SPECIMEN STATUS REPORT

## 2018-02-21 NOTE — Telephone Encounter (Signed)
It looks as the CMP is back. When you annotated his labs the CMP was pending. Can you look over the CMP and send him another MyChart message. Thanks.

## 2018-03-01 ENCOUNTER — Ambulatory Visit: Payer: Managed Care, Other (non HMO) | Admitting: Family Medicine

## 2018-03-02 ENCOUNTER — Telehealth: Payer: Self-pay

## 2018-03-02 NOTE — Telephone Encounter (Signed)
Copied from CRM [phone]. Topic: General - Call Back - No Documentation >> Mar 01, 2018 11:45 AM Windy KalataMichael, [name], NT wrote: Patient states he missed a call from the office. Please advise.  >> Mar 02, 2018  8:08 AM Landry MellowFoltz, [name] wrote: Wife called - said there was a message from the office - but it was not clear who it was for.  Please call back at [phone]

## 2018-03-02 NOTE — Telephone Encounter (Signed)
Returning Call-Not sure who called he has MyChart and all labs were sent to him through his chart. Called but unable to reach him no voicemail was set up.

## 2018-03-16 ENCOUNTER — Ambulatory Visit: Payer: Managed Care, Other (non HMO) | Admitting: Urology

## 2018-03-30 ENCOUNTER — Ambulatory Visit: Payer: Managed Care, Other (non HMO) | Admitting: Urology

## 2018-04-13 ENCOUNTER — Ambulatory Visit: Payer: Managed Care, Other (non HMO) | Admitting: Urology

## 2018-05-02 ENCOUNTER — Ambulatory Visit: Payer: Managed Care, Other (non HMO) | Admitting: Family Medicine

## 2018-05-02 ENCOUNTER — Encounter: Payer: Self-pay | Admitting: Family Medicine

## 2018-05-02 VITALS — BP 140/80 | HR 93 | Resp 16 | Ht 66.0 in | Wt 294.7 lb

## 2018-05-02 DIAGNOSIS — Z23 Encounter for immunization: Secondary | ICD-10-CM

## 2018-05-02 DIAGNOSIS — E785 Hyperlipidemia, unspecified: Secondary | ICD-10-CM

## 2018-05-02 DIAGNOSIS — Z87442 Personal history of urinary calculi: Secondary | ICD-10-CM | POA: Diagnosis not present

## 2018-05-02 DIAGNOSIS — N529 Male erectile dysfunction, unspecified: Secondary | ICD-10-CM | POA: Diagnosis not present

## 2018-05-02 DIAGNOSIS — M25542 Pain in joints of left hand: Secondary | ICD-10-CM | POA: Diagnosis not present

## 2018-05-02 DIAGNOSIS — M170 Bilateral primary osteoarthritis of knee: Secondary | ICD-10-CM

## 2018-05-02 DIAGNOSIS — N528 Other male erectile dysfunction: Secondary | ICD-10-CM

## 2018-05-02 DIAGNOSIS — I10 Essential (primary) hypertension: Secondary | ICD-10-CM | POA: Diagnosis not present

## 2018-05-02 DIAGNOSIS — R7303 Prediabetes: Secondary | ICD-10-CM | POA: Diagnosis not present

## 2018-05-02 DIAGNOSIS — M25541 Pain in joints of right hand: Secondary | ICD-10-CM

## 2018-05-02 DIAGNOSIS — E781 Pure hyperglyceridemia: Secondary | ICD-10-CM | POA: Diagnosis not present

## 2018-05-02 MED ORDER — ASPIRIN EC 81 MG PO TBEC: 81.0000 mg | DELAYED_RELEASE_TABLET | Freq: Every day | ORAL | 0 refills | Status: DC

## 2018-05-02 NOTE — Progress Notes (Signed)
Name: Tom Underwood   MRN: [id number]    DOB: 10-30-1951   Date:05/02/2018       Progress Note  Subjective  Chief Complaint  Chief Complaint  Patient presents with  . Hyperglycemia  . Knee Pain  . Arthritis    pain in knee and ankles is gradually worsening.    HPI  HTN: he is not taking bp medication still, worried about the recall. Explained that I can change to another medication, but he would like to try going back on Valsartan, he denies chest pain or palpitation , denies sob with activity. Discussed ASCVD and advised him to start asprin 81 mg daily   Hyperlipidemia: he is taking Crestor and Lovaza and denies side effects, last labs were at goal. No side effects of medication. Recheck labs next visit   BPH: we will recheck PSA, he sees Michiel Cowboy for his urological care  OA knee: going on for many years, intermittent effusion, daily aching pain. Anterior knee, labs all normal, he would like to see Ortho now and we will place referral   Morbid obesity / pre diabetes: discussed options with patient. Discussed Metformin or GLP-1 agonist, also have Belviq, but would hold off on Qsymia or contrave because of high bp. Discussed contra-indication of GLP-1 agonist, he will read about it first  Arthralgia and stiffness hands: he states he has noticed worsening joint pain, stiffness for over one year, having trigger fingers. Initially only left hand, but now also on the right hand. Taking Meloxicam and initially it helped. Running warm water on his hands in the morning improves symptoms. He denies family history of inflammatory arthritis. Reviewed labs and negative for Rheumatological problem   Patient Active Problem List   Diagnosis Date Noted  . Hyperglycemia 01/30/2017  . Arthritis of knee, degenerative 01/30/2017  . BPH with obstruction/lower urinary tract symptoms 02/03/2016  . Gross hematuria 02/03/2016  . Dyslipidemia 08/04/2015  . Skin lesions 07/14/2015  .  Diverticulitis of large intestine without perforation or abscess without bleeding 06/30/2015  . Edema 06/30/2015  . OSA on CPAP 06/30/2015  . Kidney pain 06/30/2015    Past Surgical History:  Procedure Laterality Date  . EXTERNAL EAR SURGERY    . FINGER SURGERY    . KNEE SURGERY    . left eye      Family History  Problem Relation Age of Onset  . Asthma Mother   . Heart disease Mother   . Heart attack Mother   . Asthma Father   . Heart disease Father   . Stroke Father     Social History   Socioeconomic History  . Marital status: Married    Spouse name: Not on file  . Number of children: Not on file  . Years of education: Not on file  . Highest education level: Not on file  Occupational History  . Not on file  Social Needs  . Financial resource strain: Not on file  . Food insecurity:    Worry: Not on file    Inability: Not on file  . Transportation needs:    Medical: Not on file    Non-medical: Not on file  Tobacco Use  . Smoking status: Former Smoker    Types: Pipe  . Smokeless tobacco: Never Used  Substance and Sexual Activity  . Alcohol use: No    Alcohol/week: 0.0 oz  . Drug use: No  . Sexual activity: Yes    Partners: Female  Lifestyle  .  Physical activity:    Days per week: Not on file    Minutes per session: Not on file  . Stress: Not on file  Relationships  . Social connections:    Talks on phone: Not on file    Gets together: Not on file    Attends religious service: Not on file    Active member of club or organization: Not on file    Attends meetings of clubs or organizations: Not on file    Relationship status: Not on file  . Intimate partner violence:    Fear of current or ex partner: Not on file    Emotionally abused: Not on file    Physically abused: Not on file    Forced sexual activity: Not on file  Other Topics Concern  . Not on file  Social History Narrative  . Not on file     Current Outpatient Medications:  .  meloxicam  (MOBIC) 15 MG tablet, Take 1 tablet (15 mg total) by mouth daily., Disp: 30 tablet, Rfl: 3 .  Multiple Vitamin (MULTIVITAMIN) tablet, Take 1 tablet by mouth daily., Disp: , Rfl:  .  omega-3 acid ethyl esters (LOVAZA) 1 g capsule, Take 2 capsules (2 g total) by mouth 2 (two) times daily., Disp: 360 capsule, Rfl: 1 .  omeprazole (PRILOSEC) 20 MG capsule, Take 20 mg by mouth daily. Reported on 02/19/2016, Disp: , Rfl:  .  rosuvastatin (CRESTOR) 5 MG tablet, Take 1 tablet (5 mg total) by mouth at bedtime., Disp: 90 tablet, Rfl: 1 .  Saccharomyces boulardii (PROBIOTIC) 250 MG CAPS, Take 1 capsule by mouth daily., Disp: 30 capsule, Rfl: 0 .  sildenafil (VIAGRA) 50 MG tablet, Take 1 tablet (50 mg total) by mouth daily as needed for erectile dysfunction., Disp: 10 tablet, Rfl: 0 .  valsartan (DIOVAN) 160 MG tablet, Take 1 tablet (160 mg total) by mouth daily., Disp: 90 tablet, Rfl: 0 .  amitriptyline (ELAVIL) 25 MG tablet, Take 1 tablet (25 mg total) by mouth at bedtime as needed for sleep., Disp: 90 tablet, Rfl: 0  Allergies  Allergen Reactions  . Penicillins Itching     ROS  Constitutional: Negative for fever or significant  weight change.  Respiratory: Negative for cough and shortness of breath.   Cardiovascular: Negative for chest pain or palpitations.  Gastrointestinal: Negative for abdominal pain, no bowel changes.  Musculoskeletal: Positive for gait problem and joint swelling.  Skin: Negative for rash.  Neurological: Negative for dizziness or headache.  No other specific complaints in a complete review of systems (except as listed in HPI above).  Objective  Vitals:   05/02/18 0743  BP: 140/80  Pulse: 93  Resp: 16  SpO2: 96%  Weight: 294 lb 11.2 oz (133.7 kg)  Height:  (1.676 m)    Body mass index is 47.57 kg/m.  Physical Exam  Constitutional: Patient appears well-developed and well-nourished. Obese No distress.  HEENT: head atraumatic, normocephalic, pupils equal and  reactive to light, neck supple, throat within normal limits Cardiovascular: Normal rate, regular rhythm and normal heart sounds.  No murmur heard. No BLE edema. Pulmonary/Chest: Effort normal and breath sounds normal. No respiratory distress. Abdominal: Soft.  There is no tenderness. Muscular skeletal: he has crepitus with extension of both knees, some DIP joints hypertrophy both hands.  Psychiatric: Patient has a normal mood and affect. behavior is normal. Judgment and thought content normal.  Recent Results (from the past 2160 hour(s))  HIV antibody  Status: None   Collection Time: 02/16/18  4:30 PM  Result Value Ref Range   HIV Screen 4th Generation wRfx Non Reactive Non Reactive  PSA     Status: None   Collection Time: 02/16/18  4:30 PM  Result Value Ref Range   Prostate Specific Ag, Serum 0.3 0.0 - 4.0 ng/mL    Comment: Roche ECLIA methodology. According to the American Urological Association, Serum PSA should decrease and remain at undetectable levels after radical prostatectomy. The AUA defines biochemical recurrence as an initial PSA value 0.2 ng/mL or greater followed by a subsequent confirmatory PSA value 0.2 ng/mL or greater. Values obtained with different assay methods or kits cannot be used interchangeably. Results cannot be interpreted as absolute evidence of the presence or absence of malignant disease.   C-reactive protein     Status: None   Collection Time: 02/16/18  4:30 PM  Result Value Ref Range   CRP 1.8 0.0 - 4.9 mg/L  Sedimentation rate     Status: None   Collection Time: 02/16/18  4:30 PM  Result Value Ref Range   Sed Rate 22 0 - 30 mm/hr  ANA,IFA RA Diag Pnl w/rflx Tit/Patn     Status: None   Collection Time: 02/16/18  4:30 PM  Result Value Ref Range   ANA Titer 1 Negative     Comment:                                      Negative   <1:80                                      Borderline  1:80                                      Positive   >1:80     Rhuematoid fact SerPl-aCnc <10.0 0.0 - 13.9 IU/mL   Cyclic Citrullin Peptide Ab 1 0 - 19 units    Comment:                           Negative               <20                           Weak positive      20 - 39                           Moderate positive  40 - 59                           Strong positive        >59   Hepatitis c antibody (reflex)     Status: None   Collection Time: 02/16/18  4:30 PM  Result Value Ref Range   HCV Ab <0.1 0.0 - 0.9 s/co ratio  HCV Comment:     Status: None   Collection Time: 02/16/18  4:30 PM  Result Value Ref Range   Comment: Comment     Comment: Non reactive HCV antibody screen  is consistent with no HCV infection, unless recent infection is suspected or other evidence exists to indicate HCV infection.   Comp. Metabolic Panel (12)     Status: Abnormal   Collection Time: 02/16/18  4:30 PM  Result Value Ref Range   Glucose 100 (H) 65 - 99 mg/dL   BUN 16 8 - 27 mg/dL   Creatinine, Ser 8.11 0.76 - 1.27 mg/dL   GFR calc non Af Amer 72 >59 mL/min/1.73   GFR calc Af Amer 83 >59 mL/min/1.73   BUN/Creatinine Ratio 15 10 - 24   Sodium 141 134 - 144 mmol/L   Potassium 4.3 3.5 - 5.2 mmol/L   Chloride 100 96 - 106 mmol/L   Calcium 10.0 8.6 - 10.2 mg/dL   Total Protein 7.4 6.0 - 8.5 g/dL   Albumin 4.5 3.6 - 4.8 g/dL   Globulin, Total 2.9 1.5 - 4.5 g/dL   Albumin/Globulin Ratio 1.6 1.2 - 2.2   Bilirubin Total 0.4 0.0 - 1.2 mg/dL   Alkaline Phosphatase 119 (H) 39 - 117 IU/L   AST 21 0 - 40 IU/L  Specimen status report     Status: None   Collection Time: 02/16/18  4:30 PM  Result Value Ref Range   specimen status report Comment     Comment: Written Authorization Written Authorization Written Authorization Received. Authorization received from K Shaquasha Gerstel 02-21-2018 Logged by Tiana Loft     D PHQ2/9: Depression screen [facility] 2/9 05/02/2018 02/16/2018 12/01/2017 08/30/2017 06/02/2017  Decreased Interest 0 0 0 0 0  Down, Depressed, Hopeless 0 1 0 0  0  PHQ - 2 Score 0 1 0 0 0     Fall Risk: Fall Risk  05/02/2018 02/16/2018 12/01/2017 08/30/2017 06/02/2017  Falls in the past year? No No No No No    Assessment & Plan  1. Hypertension, benign  He will resume taking Valsartan and will call for a refill  - aspirin EC 81 MG tablet; Take 1 tablet (81 mg total) by mouth daily.  Dispense: 30 tablet; Refill: 0  2. Need for shingles vaccine  refused  3. Need for vaccination for pneumococcus  Refused  4. Arthralgia of hands, bilateral  Negative studies   5. Primary osteoarthritis of both knees  Taking tylenol daily, off meloxicam, except for prn use  6. Hypertriglyceridemia  On Lovaza   7. Prediabetes  Recheck labs yearly   8. Erectile dysfunction, unspecified erectile dysfunction type  Sees urologist   9. Dyslipidemia   10. Morbid obesity (HCC)  Discussed with the patient the risk posed by an increased BMI. Discussed importance of portion control, calorie counting and at least 150 minutes of physical activity weekly. Avoid sweet beverages and drink more water. Eat at least 6 servings of fruit and vegetables daily

## 2018-05-02 NOTE — Patient Instructions (Addendum)
Diverticulitis Diverticulitis is infection or inflammation of small pouches (diverticula) in the colon that form due to a condition called diverticulosis. Diverticula can trap stool (feces) and bacteria, causing infection and inflammation. Diverticulitis may cause severe stomach pain and diarrhea. It may lead to tissue damage in the colon that causes bleeding. The diverticula may also burst (rupture) and cause infected stool to enter other areas of the abdomen. Complications of diverticulitis can include:  Bleeding.  Severe infection.  Severe pain.  Rupture (perforation) of the colon.  Blockage (obstruction) of the colon.  What are the causes? This condition is caused by stool becoming trapped in the diverticula, which allows bacteria to grow in the diverticula. This leads to inflammation and infection. What increases the risk? You are more likely to develop this condition if:  You have diverticulosis. The risk for diverticulosis increases if: ? You are overweight or obese. ? You use tobacco products. ? You do not get enough exercise.  You eat a diet that does not include enough fiber. High-fiber foods include fruits, vegetables, beans, nuts, and whole grains.  What are the signs or symptoms? Symptoms of this condition may include:  Pain and tenderness in the abdomen. The pain is normally located on the left side of the abdomen, but it may occur in other areas.  Fever and chills.  Bloating.  Cramping.  Nausea.  Vomiting.  Changes in bowel routines.  Blood in your stool.  How is this diagnosed? This condition is diagnosed based on:  Your medical history.  A physical exam.  Tests to make sure there is nothing else causing your condition. These tests may include: ? Blood tests. ? Urine tests. ? Imaging tests of the abdomen, including X-rays, ultrasounds, MRIs, or CT scans.  How is this treated? Most cases of this condition are mild and can be treated at home.  Treatment may include:  Taking over-the-counter pain medicines.  Following a clear liquid diet.  Taking antibiotic medicines by mouth.  Rest.  More severe cases may need to be treated at a hospital. Treatment may include:  Not eating or drinking.  Taking prescription pain medicine.  Receiving antibiotic medicines through an IV tube.  Receiving fluids and nutrition through an IV tube.  Surgery.  When your condition is under control, your health care provider may recommend that you have a colonoscopy. This is an exam to look at the entire large intestine. During the exam, a lubricated, bendable tube is inserted into the anus and then passed into the rectum, colon, and other parts of the large intestine. A colonoscopy can show how severe your diverticula are and whether something else may be causing your symptoms. Follow these instructions at home: Medicines  Take over-the-counter and prescription medicines only as told by your health care provider. These include fiber supplements, probiotics, and stool softeners.  If you were prescribed an antibiotic medicine, take it as told by your health care provider. Do not stop taking the antibiotic even if you start to feel better.  Do not drive or use heavy machinery while taking prescription pain medicine. General instructions  Follow a full liquid diet or another diet as directed by your health care provider. After your symptoms improve, your health care provider may tell you to change your diet. He or she may recommend that you eat a diet that contains at least 25 g (25 grams) of fiber daily. Fiber makes it easier to pass stool. Healthy sources of fiber include: ? Berries. One cup  contains 4-8 grams of fiber. ? Beans or lentils. One half cup contains 5-8 grams of fiber. ? Green vegetables. One cup contains 4 grams of fiber.  Exercise for at least 30 minutes, 3 times each week. You should exercise hard enough to raise your heart rate and  break a sweat.  Keep all follow-up visits as told by your health care provider. This is important. You may need a colonoscopy. Contact a health care provider if:  Your pain does not improve.  You have a hard time drinking or eating food.  Your bowel movements do not return to normal. Get help right away if:  Your pain gets worse.  Your symptoms do not get better with treatment.  Your symptoms suddenly get worse.  You have a fever.  You vomit more than one time.  You have stools that are bloody, black, or tarry. Summary  Diverticulitis is infection or inflammation of small pouches (diverticula) in the colon that form due to a condition called diverticulosis. Diverticula can trap stool (feces) and bacteria, causing infection and inflammation.  You are at higher risk for this condition if you have diverticulosis and you eat a diet that does not include enough fiber.  Most cases of this condition are mild and can be treated at home. More severe cases may need to be treated at a hospital.  When your condition is under control, your health care provider may recommend that you have an exam called a colonoscopy. This exam can show how severe your diverticula are and whether something else may be causing your symptoms. This information is not intended to replace advice given to you by your health care provider. Make sure you discuss any questions you have with your health care provider. Document Released: 08/31/2005 Document Revised: 12/24/2016 Document Reviewed: 12/24/2016 Elsevier Interactive Patient Education  2018 [employer]. Liraglutide injection (Weight Management)   You can also research: Ozempic, Trulicity and metformin   What is this medicine? LIRAGLUTIDE (LIR a GLOO tide) is used with a reduced calorie diet and exercise to help you lose weight. This medicine may be used for other purposes; ask your health care provider or pharmacist if you have questions. COMMON BRAND  NAME(S): Saxenda What should I tell my health care provider before I take this medicine? They need to know if you have any of these conditions: -endocrine tumors (MEN 2) or if someone in your family had these tumors -gallbladder disease -high cholesterol -history of alcohol abuse problem -history of pancreatitis -kidney disease or if you are on dialysis -liver disease -previous swelling of the tongue, face, or lips with difficulty breathing, difficulty swallowing, hoarseness, or tightening of the throat -stomach problems -suicidal thoughts, plans, or attempt; a previous suicide attempt by you or a family member -thyroid cancer or if someone in your family had thyroid cancer -an unusual or allergic reaction to liraglutide, other medicines, foods, dyes, or preservatives -pregnant or trying to get pregnant -breast-feeding How should I use this medicine? This medicine is for injection under the skin of your upper leg, stomach area, or upper arm. You will be taught how to prepare and give this medicine. Use exactly as directed. Take your medicine at regular intervals. Do not take it more often than directed. It is important that you put your used needles and syringes in a special sharps container. Do not put them in a trash can. If you do not have a sharps container, call your pharmacist or healthcare provider to get one.  A special MedGuide will be given to you by the pharmacist with each prescription and refill. Be sure to read this information carefully each time. Talk to your pediatrician regarding the use of this medicine in children. Special care may be needed. Overdosage: If you think you have taken too much of this medicine contact a poison control center or emergency room at once. NOTE: This medicine is only for you. Do not share this medicine with others. What if I miss a dose? If you miss a dose, take it as soon as you can. If it is almost time for your next dose, take only that dose.  Do not take double or extra doses. If you miss your dose for 3 days or more, call your doctor or health care professional to talk about how to restart this medicine. What may interact with this medicine? -insulin and other medicines for diabetes This list may not describe all possible interactions. Give your health care provider a list of all the medicines, herbs, non-prescription drugs, or dietary supplements you use. Also tell them if you smoke, drink alcohol, or use illegal drugs. Some items may interact with your medicine. What should I watch for while using this medicine? Visit your doctor or health care professional for regular checks on your progress. This medicine is intended to be used in addition to a healthy diet and appropriate exercise. The best results are achieved this way. Do not increase or in any way change your dose without consulting your doctor or health care professional. Drink plenty of fluids while taking this medicine. Check with your doctor or health care professional if you get an attack of severe diarrhea, nausea, and vomiting. The loss of too much body fluid can make it dangerous for you to take this medicine. This medicine may affect blood sugar levels. If you have diabetes, check with your doctor or health care professional before you change your diet or the dose of your diabetic medicine. Patients and their families should watch out for worsening depression or thoughts of suicide. Also watch out for sudden changes in feelings such as feeling anxious, agitated, panicky, irritable, hostile, aggressive, impulsive, severely restless, overly excited and hyperactive, or not being able to sleep. If this happens, especially at the beginning of treatment or after a change in dose, call your health care professional. What side effects may I notice from receiving this medicine? Side effects that you should report to your doctor or health care professional as soon as  possible: -allergic reactions like skin rash, itching or hives, swelling of the face, lips, or tongue -breathing problems -diarrhea that continues or is severe -lump or swelling on the neck -severe nausea -signs and symptoms of infection like fever or chills; cough; sore throat; pain or trouble passing urine -signs and symptoms of low blood sugar such as feeling anxious, confusion, dizziness, increased hunger, unusually weak or tired, sweating, shakiness, cold, irritable, headache, blurred vision, fast heartbeat, loss of consciousness -signs and symptoms of kidney injury like trouble passing urine or change in the amount of urine -trouble swallowing -unusual stomach upset or pain -vomiting Side effects that usually do not require medical attention (report to your doctor or health care professional if they continue or are bothersome): -constipation -decreased appetite -diarrhea -fatigue -headache -nausea -pain, redness, or irritation at site where injected -stomach upset -stuffy or runny nose This list may not describe all possible side effects. Call your doctor for medical advice about side effects. You may report side  effects to FDA at 1-800-FDA-1088. Where should I keep my medicine? Keep out of the reach of children. Store unopened pen in a refrigerator between 2 and 8 degrees C (36 and 46 degrees F). Do not freeze or use if the medicine has been frozen. Protect from light and excessive heat. After you first use the pen, it can be stored at room temperature between 15 and 30 degrees C (59 and 86 degrees F) or in a refrigerator. Throw away your used pen after 30 days or after the expiration date, whichever comes first. Do not store your pen with the needle attached. If the needle is left on, medicine may leak from the pen. NOTE: This sheet is a summary. It may not cover all possible information. If you have questions about this medicine, talk to your doctor, pharmacist, or health care  provider.  2018 Elsevier/Gold Standard (2016-12-08 14:41:37)

## 2018-05-10 ENCOUNTER — Other Ambulatory Visit: Payer: Self-pay

## 2018-05-10 DIAGNOSIS — N400 Enlarged prostate without lower urinary tract symptoms: Secondary | ICD-10-CM

## 2018-05-10 DIAGNOSIS — N200 Calculus of kidney: Secondary | ICD-10-CM

## 2018-05-10 NOTE — Progress Notes (Signed)
05/11/2018 9:41 AM   Tom Underwood 1951-11-09 [id number]  Referring provider: Ellyn Hack, MD [address] [place], [place] [address]  Chief Complaint  Patient presents with  . Hematuria  . Benign Prostatic Hypertrophy    HPI: 67 yo WM with a history of hematuria, nephrolithiasis and BPH with LU TS who presents today for a one year follow up.  History of hematuria He completed a hematuria work up with a CT Urogram and cystoscopy in 2017 - no worrisome findings.  He does not endorse gross hematuria.  His UA today is negative for hematuria.     Nephrolithiasis 1 mm stone in left kidney seen on 2017 CT Urogram.  Patient has a prior history of nephrolithiasis. In 2014, he spontaneously passed a left UPJ stone. The stone composition is unknown.  He has not had any flank pain, since his last visit with [place].   BPH  His IPSS score today is 3, which is mild lower urinary tract symptomatology.  He is delighted with his quality life due to his urinary symptoms. His previous IPSS score was 0/0.  His has frequency and incontinence.  He is drinking 2 gallons of water daily.  He denies any dysuria, hematuria or suprapubic pain.   He also denies any recent fevers, chills, nausea or vomiting.   He does not have a family history of PCa. IPSS    Row Name 05/11/18 0900         International Prostate Symptom Score   How often have you had the sensation of not emptying your bladder?  Not at All     How often have you had to urinate less than every two hours?  Less than 1 in 5 times     How often have you found you stopped and started again several times when you urinated?  Less than 1 in 5 times     How often have you found it difficult to postpone urination?  Not at All     How often have you had a weak urinary stream?  Less than 1 in 5 times     How often have you had to strain to start urination?  Not at All     How many times did you typically get up at night to  urinate?  None     Total IPSS Score  3       Quality of Life due to urinary symptoms   If you were to spend the rest of your life with your urinary condition just the way it is now how would you feel about that?  Delighted        Score:  1-7 Mild 8-19 Moderate 20-35 Severe   PMH: Past Medical History:  Diagnosis Date  . Decreased libido   . Diverticulitis   . HLD (hyperlipidemia)   . Hydronephrosis with renal and ureteral calculus obstruction   . Hydronephrosis with renal and ureteral calculus obstruction   . Hypogonadism in male   . Rhinitis, allergic   . Sleep apnea   . Ureteral stone     Surgical History: Past Surgical History:  Procedure Laterality Date  . EXTERNAL EAR SURGERY    . FINGER SURGERY    . KNEE SURGERY    . left eye      Home Medications:  Allergies as of 05/11/2018      Reactions   Penicillins Itching      Medication List  Accurate as of 05/11/18  9:41 AM. Always use your most recent med list.          amitriptyline 25 MG tablet Commonly known as:  ELAVIL Take 1 tablet (25 mg total) by mouth at bedtime as needed for sleep.   aspirin EC 81 MG tablet Take 1 tablet (81 mg total) by mouth daily.   meloxicam 15 MG tablet Commonly known as:  MOBIC Take 1 tablet (15 mg total) by mouth daily.   multivitamin tablet Take 1 tablet by mouth daily.   omega-3 acid ethyl esters 1 g capsule Commonly known as:  LOVAZA Take 2 capsules (2 g total) by mouth 2 (two) times daily.   omeprazole 20 MG capsule Commonly known as:  PRILOSEC Take 20 mg by mouth daily. Reported on 02/19/2016   Probiotic 250 MG Caps Take 1 capsule by mouth daily.   rosuvastatin 5 MG tablet Commonly known as:  CRESTOR Take 1 tablet (5 mg total) by mouth at bedtime.   sildenafil 50 MG tablet Commonly known as:  VIAGRA Take 1 tablet (50 mg total) by mouth daily as needed for erectile dysfunction.   valsartan 160 MG tablet Commonly known as:  DIOVAN Take 1 tablet  (160 mg total) by mouth daily.       Allergies:  Allergies  Allergen Reactions  . Penicillins Itching    Family History: Family History  Problem Relation Age of Onset  . Asthma Mother   . Heart disease Mother   . Heart attack Mother   . Asthma Father   . Heart disease Father   . Stroke Father     Social History:  reports that he has quit smoking. His smoking use included pipe. He has never used smokeless tobacco. He reports that he does not drink alcohol or use drugs.  ROS: UROLOGY Frequent Urination?: Yes Hard to postpone urination?: No Burning/pain with urination?: No Get up at night to urinate?: No Leakage of urine?: Yes Urine stream starts and stops?: No Trouble starting stream?: No Do you have to strain to urinate?: No Blood in urine?: No Urinary tract infection?: No Sexually transmitted disease?: No Injury to kidneys or bladder?: No Painful intercourse?: No Weak stream?: No Erection problems?: Yes Penile pain?: No  Gastrointestinal Nausea?: No Vomiting?: No Indigestion/heartburn?: No Diarrhea?: No Constipation?: No  Constitutional Fever: No Night sweats?: No Weight loss?: No Fatigue?: No  Skin Skin rash/lesions?: No Itching?: No  Eyes Blurred vision?: No Double vision?: No  Ears/Nose/Throat Sore throat?: No Sinus problems?: No  Hematologic/Lymphatic Swollen glands?: No Easy bruising?: No  Cardiovascular Leg swelling?: No Chest pain?: No  Respiratory Cough?: No Shortness of breath?: No  Endocrine Excessive thirst?: No  Musculoskeletal Back pain?: No Joint pain?: Yes  Neurological Headaches?: No Dizziness?: No  Psychologic Depression?: No Anxiety?: No  Physical Exam: BP (!) 154/86 (BP Location: Left Arm, Patient Position: Sitting, Cuff Size: Large)   Pulse 75   Ht 5\' 6"  (1.676 m)   Wt 294 lb (133.4 kg)   BMI 47.45 kg/m   Constitutional: Well nourished. Alert and oriented, No acute distress. HEENT: [place] AT, moist  mucus membranes. Trachea midline, no masses. Cardiovascular: No clubbing, cyanosis, or edema. Respiratory: Normal respiratory effort, no increased work of breathing. GI: Abdomen is soft, non tender, non distended, no abdominal masses. Liver and spleen not palpable.  No hernias appreciated.  Stool sample for occult testing is not indicated.   GU: No CVA tenderness.  No bladder fullness or masses.  Patient with circumcised phallus.   Urethral meatus is patent.  No penile discharge. No penile lesions or rashes. Scrotum without lesions, cysts, rashes and/or edema.  Testicles are located scrotally bilaterally. No masses are appreciated in the testicles. Left and right epididymis are normal. Rectal: Patient with  normal sphincter tone. Anus and perineum without scarring or rashes. No rectal masses are appreciated. Prostate is approximately 55 grams, no nodules are appreciated. Seminal vesicles are normal. Skin: No rashes, bruises or suspicious lesions. Lymph: No cervical or inguinal adenopathy. Neurologic: Grossly intact, no focal deficits, moving all 4 extremities. Psychiatric: Normal mood and affect.   Laboratory Data:  Lab Results  Component Value Date   CREATININE 1.08 02/16/2018    Lab Results  Component Value Date   TSH 2.400 08/07/2015       Component Value Date/Time   CHOL 170 08/30/2017 0912   HDL 62 08/30/2017 0912   CHOLHDL 2.7 08/30/2017 0912   LDLCALC 95 08/30/2017 0912    Lab Results  Component Value Date   AST 21 02/16/2018   Lab Results  Component Value Date   ALT 31 02/10/2017   PSA History  0.3 ng/mL on 01/22/2016  0.3 ng/mL on 02/2018 I have reviewed the labs.  Urinalysis Negative.  See EPIC.    Assessment & Plan:    1. History of hematuria Completed hematuria work up in 2017 - no worrisome findings No recent gross hematuria UA was negative - no further screening needed as patient has had three negative fro [facility] UA's      2. Nephrolithiasis  - 1  mm stone in left kidney  - no intervention at this time  3. BPH   - IPSS score is 3/0, it is worsening  - Continue conservative management, avoiding bladder irritants and timed voiding's  - RTC in 12 months for IPSS, PSA and exam   4. ED Patient requested a refill on his sildenafil 20 mg, 3 to 5 tablets two hours prior to intercourse on an empty stomach, # 50; he is warned not to take medications that contain nitrates.  I also advised him of the side effects, such as: headache, flushing, dyspepsia, abnormal vision, nasal congestion, back pain, myalgia, nausea, dizziness, and rash - sent to pharmacy   Return in about 1 year (around 05/12/2019) for IPSS, PSA and exam.  These notes generated with voice recognition software. I apologize for typographical errors.  Michiel CowboySHANNON Goodwin Kamphaus, PA-C  [facility] [address] [place], [place] [address] [phone]

## 2018-05-11 ENCOUNTER — Ambulatory Visit (INDEPENDENT_AMBULATORY_CARE_PROVIDER_SITE_OTHER): Payer: Managed Care, Other (non HMO) | Admitting: Urology

## 2018-05-11 ENCOUNTER — Encounter: Payer: Self-pay | Admitting: Urology

## 2018-05-11 ENCOUNTER — Other Ambulatory Visit: Payer: Self-pay | Admitting: Urology

## 2018-05-11 VITALS — BP 154/86 | HR 75 | Ht 66.0 in | Wt 294.0 lb

## 2018-05-11 DIAGNOSIS — N200 Calculus of kidney: Secondary | ICD-10-CM | POA: Diagnosis not present

## 2018-05-11 DIAGNOSIS — Z87448 Personal history of other diseases of urinary system: Secondary | ICD-10-CM

## 2018-05-11 DIAGNOSIS — N400 Enlarged prostate without lower urinary tract symptoms: Secondary | ICD-10-CM

## 2018-05-11 MED ORDER — SILDENAFIL CITRATE 20 MG PO TABS: ORAL_TABLET | ORAL | 3 refills | Status: DC

## 2018-05-12 ENCOUNTER — Telehealth: Payer: Self-pay | Admitting: Urology

## 2018-05-12 LAB — URINALYSIS, COMPLETE
Bilirubin, UA: NEGATIVE
Glucose, UA: NEGATIVE
Ketones, UA: NEGATIVE
Leukocytes, UA: NEGATIVE
Nitrite, UA: NEGATIVE
Protein, UA: NEGATIVE
RBC, UA: NEGATIVE
Specific Gravity, UA: 1.02 (ref 1.005–1.030)
Urobilinogen, Ur: 0.2 mg/dL (ref 0.2–1.0)
pH, UA: 6.5 (ref 5.0–7.5)

## 2018-05-12 LAB — MICROSCOPIC EXAMINATION
Bacteria, UA: NONE SEEN
Casts: NONE SEEN /lpf
Epithelial Cells (non renal): NONE SEEN /hpf (ref 0–10)

## 2018-05-12 NOTE — Telephone Encounter (Signed)
Please let Tom Underwood know that his urine was negative for blood.

## 2018-05-14 ENCOUNTER — Telehealth: Payer: Self-pay | Admitting: Urology

## 2018-05-14 NOTE — Telephone Encounter (Signed)
Left detailed message on pt vm.  

## 2018-05-14 NOTE — Telephone Encounter (Signed)
Pt returned call and I read message from Shannon. 

## 2018-07-03 ENCOUNTER — Other Ambulatory Visit: Payer: Self-pay

## 2018-07-03 MED ORDER — MELOXICAM 15 MG PO TABS: 15.0000 mg | ORAL_TABLET | Freq: Every day | ORAL | 0 refills | Status: DC

## 2018-07-03 NOTE — Telephone Encounter (Signed)
Pharmacy refill request for Meloxicam   Per Dr. Stacie AcresMayer, ok to refill this time only, needs follow up appt.  Script has been sent to pharmacy

## 2018-08-16 ENCOUNTER — Other Ambulatory Visit: Payer: Self-pay | Admitting: Podiatry

## 2018-08-28 ENCOUNTER — Encounter: Payer: Managed Care, Other (non HMO) | Admitting: Family Medicine

## 2018-10-04 ENCOUNTER — Encounter: Payer: Self-pay | Admitting: Podiatry

## 2018-10-04 ENCOUNTER — Ambulatory Visit (INDEPENDENT_AMBULATORY_CARE_PROVIDER_SITE_OTHER): Payer: Managed Care, Other (non HMO) | Admitting: Podiatry

## 2018-10-04 ENCOUNTER — Ambulatory Visit (INDEPENDENT_AMBULATORY_CARE_PROVIDER_SITE_OTHER): Payer: Managed Care, Other (non HMO)

## 2018-10-04 DIAGNOSIS — M19072 Primary osteoarthritis, left ankle and foot: Secondary | ICD-10-CM | POA: Diagnosis not present

## 2018-10-04 DIAGNOSIS — M19071 Primary osteoarthritis, right ankle and foot: Secondary | ICD-10-CM

## 2018-10-04 DIAGNOSIS — M2020 Hallux rigidus, unspecified foot: Secondary | ICD-10-CM

## 2018-10-04 DIAGNOSIS — M205X9 Other deformities of toe(s) (acquired), unspecified foot: Secondary | ICD-10-CM

## 2018-10-04 MED ORDER — MELOXICAM 15 MG PO TABS: 15.0000 mg | ORAL_TABLET | Freq: Every day | ORAL | 2 refills | Status: DC

## 2018-10-04 NOTE — Progress Notes (Signed)
This patient presents to the office for pain in his feet and pain in the front of his ankle.  He was diagnosed with hallux limitus both feet and treated with orthoses.  He says this has helped but by the end of the day his feet hurt.  He says he uses ice every night when he sleeps.   He says the orthoses have been helped and he is wearing them today.  Examination of these orthoses reveal no kinetic wedge applied to the orthoses.  He has taken pain medicine by mouth which has mildly helped.  He presents to the office for evaluation and treatment.  Vascular  Dorsalis pedis and posterior tibial pulses are palpable  B/L.  Capillary return  WNL.  Temperature gradient is  WNL.  Skin turgor  WNL  Sensorium  Senn Weinstein monofilament wire  WNL. Normal tactile sensation.  Nail Exam  Patient has normal nails with no evidence of bacterial or fungal infection.  Orthopedic  Exam  Muscle tone and muscle strength  WNL.  No limitations of motion feet  B/L.  No crepitus or joint effusion noted.  Hallux limitus 1st MPJ  B/L.  Palpable pain on the anterior aspect ankle  B/L.    Skin  No open lesions.  Normal skin texture and turgor.  Ankle arthritis  Hallux limitus 1st MPJ  B/L  ROV.  Xray reveals calcification right heel.  Probable myositis ossificans.  Xrays left  ankle reveal no arthritic changes.  Os trigonum  B/L.  Calcification plantar fascia  Left heel.  After clinical examination and radiographic studies I recommended he have a kinetic wedge applied to the orthoses. To make an appointment with Raiford Noble.   Prescribed Mobic 15 mg  # 30.  RTC 4 weeks for continued evaluation.   Helane Gunther DPM

## 2018-10-05 ENCOUNTER — Ambulatory Visit: Payer: Managed Care, Other (non HMO) | Admitting: Family Medicine

## 2018-10-05 ENCOUNTER — Encounter: Payer: Self-pay | Admitting: Family Medicine

## 2018-10-05 VITALS — BP 138/70 | HR 78 | Temp 98.1°F | Resp 16 | Ht 66.0 in | Wt 281.1 lb

## 2018-10-05 DIAGNOSIS — Z23 Encounter for immunization: Secondary | ICD-10-CM

## 2018-10-05 DIAGNOSIS — G4733 Obstructive sleep apnea (adult) (pediatric): Secondary | ICD-10-CM

## 2018-10-05 DIAGNOSIS — E7800 Pure hypercholesterolemia, unspecified: Secondary | ICD-10-CM

## 2018-10-05 DIAGNOSIS — E781 Pure hyperglyceridemia: Secondary | ICD-10-CM

## 2018-10-05 DIAGNOSIS — I10 Essential (primary) hypertension: Secondary | ICD-10-CM

## 2018-10-05 DIAGNOSIS — R7303 Prediabetes: Secondary | ICD-10-CM

## 2018-10-05 DIAGNOSIS — F4321 Adjustment disorder with depressed mood: Secondary | ICD-10-CM

## 2018-10-05 MED ORDER — ROSUVASTATIN CALCIUM 5 MG PO TABS: 5.0000 mg | ORAL_TABLET | Freq: Every day | ORAL | 1 refills | Status: DC

## 2018-10-05 MED ORDER — OMEGA-3-ACID ETHYL ESTERS 1 G PO CAPS: 2.0000 | ORAL_CAPSULE | Freq: Two times a day (BID) | ORAL | 1 refills | Status: DC

## 2018-10-05 MED ORDER — VALSARTAN 80 MG PO TABS: 80.0000 mg | ORAL_TABLET | Freq: Every day | ORAL | 1 refills | Status: DC

## 2018-10-05 NOTE — Progress Notes (Signed)
Name: Tom Underwood   MRN: [id number]    DOB: 09/25/1951   Date:10/05/2018       Progress Note  Subjective  Chief Complaint  Chief Complaint  Patient presents with  . Medication Refill  . Hypertension  . Hyperlipidemia  . Benign Prostatic Hypertrophy  . Obesity    HPI  HTN: he has diovan 160 mg but forgets to take it and last dose was 4 days ago, bp is at goal today, denies chest pain, palpitation or SOB. We will decrease dose but explain that he needs to take it daily.   OSA: he has an old CPAP machine, not very compliant with mask because it bothers his sleep. He is not sure if he wants to have repeat study done at this time  Hyperlipidemia: he is taking Crestor and Lovaza and denies side effects, we will recheck labsl. No side effects of medication.   BPH: under the care of Michiel Cowboy for his urological care  Morbid obesity / pre diabetes: he is not on medication, he lost weight since last visit by decreasing portion size and eating healthier. He does not want medication. He denies polyphagia, polydipsia or polyuria.   Arthralgia and stiffness hands: he states he has noticed worsening joint pain, stiffness for over one year, having trigger fingers. Checked multiple labs earlier this year and negative for inflammatory arthritis.   Situational depression: states feels down this time of the year since father died 11 years ago, he goes visit his mother that lives in [place]. He does not want medication   Patient Active Problem List   Diagnosis Date Noted  . History of kidney stones 05/02/2018  . ED (erectile dysfunction) 05/02/2018  . Hyperglycemia 01/30/2017  . Arthritis of knee, degenerative 01/30/2017  . BPH with obstruction/lower urinary tract symptoms 02/03/2016  . Gross hematuria 02/03/2016  . Dyslipidemia 08/04/2015  . Skin lesions 07/14/2015  . Diverticulitis of large intestine without perforation or abscess without bleeding 06/30/2015  . Edema 06/30/2015   . OSA on CPAP 06/30/2015  . Kidney pain 06/30/2015    Past Surgical History:  Procedure Laterality Date  . EXTERNAL EAR SURGERY    . FINGER SURGERY    . KNEE SURGERY    . left eye      Family History  Problem Relation Age of Onset  . Asthma Mother   . Heart disease Mother   . Heart attack Mother   . Asthma Father   . Heart disease Father   . Stroke Father     Social History   Socioeconomic History  . Marital status: Married    Spouse name: [name]   . Number of children: 2  . Years of education: Not on file  . Highest education level: Not on file  Occupational History  . Occupation: Regulatory affairs officer  . Financial resource strain: Not hard at all  . Food insecurity:    Worry: Never true    Inability: Never true  . Transportation needs:    Medical: No    Non-medical: No  Tobacco Use  . Smoking status: Former Smoker    Types: Pipe  . Smokeless tobacco: Never Used  Substance and Sexual Activity  . Alcohol use: No    Alcohol/week: 0.0 standard drinks  . Drug use: No  . Sexual activity: Yes    Partners: Female  Lifestyle  . Physical activity:    Days per week: 0 days    Minutes per  session: 0 min  . Stress: Not on file  Relationships  . Social connections:    Talks on phone: Not on file    Gets together: Not on file    Attends religious service: Not on file    Active member of club or organization: Not on file    Attends meetings of clubs or organizations: Not on file    Relationship status: Not on file  . Intimate partner violence:    Fear of current or ex partner: No    Emotionally abused: No    Physically abused: No    Forced sexual activity: No  Other Topics Concern  . Not on file  Social History Narrative  . Not on file     Current Outpatient Medications:  .  amitriptyline (ELAVIL) 25 MG tablet, Take 1 tablet (25 mg total) by mouth at bedtime as needed for sleep., Disp: 90 tablet, Rfl: 0 .  aspirin EC 81 MG tablet, Take 1 tablet (81  mg total) by mouth daily., Disp: 30 tablet, Rfl: 0 .  meloxicam (MOBIC) 15 MG tablet, Take 1 tablet (15 mg total) by mouth daily., Disp: 30 tablet, Rfl: 2 .  Multiple Vitamin (MULTIVITAMIN) tablet, Take 1 tablet by mouth daily., Disp: , Rfl:  .  omega-3 acid ethyl esters (LOVAZA) 1 g capsule, Take 2 capsules (2 g total) by mouth 2 (two) times daily., Disp: 360 capsule, Rfl: 1 .  rosuvastatin (CRESTOR) 5 MG tablet, Take 1 tablet (5 mg total) by mouth at bedtime., Disp: 90 tablet, Rfl: 1 .  Saccharomyces boulardii (PROBIOTIC) 250 MG CAPS, Take 1 capsule by mouth daily., Disp: 30 capsule, Rfl: 0 .  sildenafil (REVATIO) 20 MG tablet, Take 3 to 5 tablets two hours before intercouse on an empty stomach.  Do not take with nitrates., Disp: 50 tablet, Rfl: 3 .  valsartan (DIOVAN) 80 MG tablet, Take 1 tablet (80 mg total) by mouth daily., Disp: 90 tablet, Rfl: 1  Allergies  Allergen Reactions  . Penicillins Itching    I personally reviewed active problem list, medication list, allergies, family history, social history with the patient/caregiver today.   ROS  Constitutional: Negative for fever, positive for  weight change.  Respiratory: Negative for cough and shortness of breath.   Cardiovascular: Negative for chest pain or palpitations.  Gastrointestinal: Negative for abdominal pain, no bowel changes.  Musculoskeletal: Negative for gait problem or joint swelling.  Skin: Negative for rash.  Neurological: Negative for dizziness or headache.  No other specific complaints in a complete review of systems (except as listed in HPI above).   Objective  Vitals:   10/05/18 1048  BP: 138/70  Pulse: 78  Resp: 16  Temp: 98.1 F (36.7 C)  TempSrc: Oral  SpO2: 98%  Weight: 281 lb 1.6 oz (127.5 kg)  Height: 5\' 6"  (1.676 m)    Body mass index is 45.37 kg/m.  Physical Exam  Constitutional: Patient appears well-developed and well-nourished. Obese No distress.  HEENT: head atraumatic,  normocephalic, pupils equal and reactive to light,  neck supple, throat within normal limits Cardiovascular: Normal rate, regular rhythm and normal heart sounds.  No murmur heard. Trace BLE ankle  edema. Pulmonary/Chest: Effort normal and breath sounds normal. No respiratory distress. Abdominal: Soft.  There is no tenderness. Psychiatric: Patient has a normal mood and affect. behavior is normal. Judgment and thought content normal.  PHQ2/9: Depression screen [facility] 2/9 10/05/2018 05/02/2018 02/16/2018 12/01/2017 08/30/2017  Decreased Interest 1 0 0 0 0  Down, Depressed, Hopeless 1 0 1 0 0  PHQ - 2 Score 2 0 1 0 0  Altered sleeping 0 - - - -  Tired, decreased energy 0 - - - -  Change in appetite 1 - - - -  Feeling bad or failure about yourself  1 - - - -  Trouble concentrating 0 - - - -  Moving slowly or fidgety/restless 1 - - - -  Suicidal thoughts 0 - - - -  PHQ-9 Score 5 - - - -  Difficult doing work/chores Not difficult at all - - - -   He states situational , father died 11 years ago in Nov   Fall Risk: Fall Risk  10/05/2018 05/02/2018 02/16/2018 12/01/2017 08/30/2017  Falls in the past year? 0 No No No No  Number falls in past yr: 0 - - - -  Injury with Fall? 0 - - - -     Functional Status Survey: Is the patient deaf or have difficulty hearing?: No Does the patient have difficulty seeing, even when wearing glasses/contacts?: Yes(glasses) Does the patient have difficulty concentrating, remembering, or making decisions?: No Does the patient have difficulty walking or climbing stairs?: Yes(OA bilateral knees) Does the patient have difficulty dressing or bathing?: No Does the patient have difficulty doing errands alone such as visiting a doctor's office or shopping?: No   Assessment & Plan  1. Hypertriglyceridemia  - omega-3 acid ethyl esters (LOVAZA) 1 g capsule; Take 2 capsules (2 g total) by mouth 2 (two) times daily.  Dispense: 360 capsule; Refill: 1 - Lipid panel  2. Morbid  obesity (HCC)  Losing weight with life style modification Discussed with the patient the risk posed by an increased BMI. Discussed importance of portion control, calorie counting and at least 150 minutes of physical activity weekly. Avoid sweet beverages and drink more water. Eat at least 6 servings of fruit and vegetables daily   3. Need for immunization against influenza  - Flu vaccine HIGH DOSE PF (Fluzone High dose)  4. Need for vaccination for pneumococcus  Refused, he will get it next visit   5. Hypercholesterolemia  - rosuvastatin (CRESTOR) 5 MG tablet; Take 1 tablet (5 mg total) by mouth at bedtime.  Dispense: 90 tablet; Refill: 1 - Lipid panel  6. Hypertension, benign  Explained he needs to take it daily , so we will decrease dose to 80 mg and to take with other medications in am - valsartan (DIOVAN) 80 MG tablet; Take 1 tablet (80 mg total) by mouth daily.  Dispense: 90 tablet; Refill: 1 - Comprehensive metabolic panel   8. Situational depression  He does not want medication  9. OSA (obstructive sleep apnea)   7. Prediabetes  - Hemoglobin A1c

## 2018-10-06 LAB — LIPID PANEL
Chol/HDL Ratio: 2.5 ratio (ref 0.0–5.0)
Cholesterol, Total: 144 mg/dL (ref 100–199)
HDL: 57 mg/dL (ref >39)
LDL Calculated: 75 mg/dL (ref 0–99)
Triglycerides: 61 mg/dL (ref 0–149)
VLDL Cholesterol Cal: 12 mg/dL (ref 5–40)

## 2018-10-06 LAB — COMPREHENSIVE METABOLIC PANEL
ALT: 25 IU/L (ref 0–44)
AST: 21 IU/L (ref 0–40)
Albumin/Globulin Ratio: 1.9 (ref 1.2–2.2)
Albumin: 5 g/dL — ABNORMAL HIGH (ref 3.6–4.8)
Alkaline Phosphatase: 112 IU/L (ref 39–117)
BUN/Creatinine Ratio: 16 (ref 10–24)
BUN: 14 mg/dL (ref 8–27)
Bilirubin Total: 1 mg/dL (ref 0.0–1.2)
CO2: 26 mmol/L (ref 20–29)
Calcium: 10 mg/dL (ref 8.6–10.2)
Chloride: 98 mmol/L (ref 96–106)
Creatinine, Ser: 0.89 mg/dL (ref 0.76–1.27)
GFR calc Af Amer: 104 mL/min/{1.73_m2} (ref >59)
GFR calc non Af Amer: 90 mL/min/{1.73_m2} (ref >59)
Globulin, Total: 2.6 g/dL (ref 1.5–4.5)
Glucose: 91 mg/dL (ref 65–99)
Potassium: 4.5 mmol/L (ref 3.5–5.2)
Sodium: 141 mmol/L (ref 134–144)
Total Protein: 7.6 g/dL (ref 6.0–8.5)

## 2018-10-06 LAB — HEMOGLOBIN A1C
Est. average glucose Bld gHb Est-mCnc: 108 mg/dL
Hgb A1c MFr Bld: 5.4 % (ref 4.8–5.6)

## 2018-10-17 ENCOUNTER — Ambulatory Visit (INDEPENDENT_AMBULATORY_CARE_PROVIDER_SITE_OTHER): Payer: Managed Care, Other (non HMO) | Admitting: Orthotics

## 2018-10-17 DIAGNOSIS — M205X9 Other deformities of toe(s) (acquired), unspecified foot: Secondary | ICD-10-CM

## 2018-10-17 DIAGNOSIS — M19072 Primary osteoarthritis, left ankle and foot: Secondary | ICD-10-CM

## 2018-10-17 DIAGNOSIS — M2020 Hallux rigidus, unspecified foot: Secondary | ICD-10-CM

## 2018-10-17 DIAGNOSIS — M19071 Primary osteoarthritis, right ankle and foot: Secondary | ICD-10-CM

## 2018-10-17 NOTE — Progress Notes (Signed)
Added b/l k-wedge under 1st mpj per dr. Stacie AcresMayer.

## 2018-10-19 ENCOUNTER — Ambulatory Visit (INDEPENDENT_AMBULATORY_CARE_PROVIDER_SITE_OTHER): Payer: Managed Care, Other (non HMO) | Admitting: Family Medicine

## 2018-10-19 ENCOUNTER — Encounter: Payer: Self-pay | Admitting: Family Medicine

## 2018-10-19 VITALS — BP 132/78 | HR 84 | Temp 98.3°F | Resp 16 | Ht 66.0 in | Wt 281.9 lb

## 2018-10-19 DIAGNOSIS — E785 Hyperlipidemia, unspecified: Secondary | ICD-10-CM

## 2018-10-19 DIAGNOSIS — R739 Hyperglycemia, unspecified: Secondary | ICD-10-CM

## 2018-10-19 DIAGNOSIS — Z23 Encounter for immunization: Secondary | ICD-10-CM | POA: Diagnosis not present

## 2018-10-19 DIAGNOSIS — L989 Disorder of the skin and subcutaneous tissue, unspecified: Secondary | ICD-10-CM

## 2018-10-19 DIAGNOSIS — Z0000 Encounter for general adult medical examination without abnormal findings: Secondary | ICD-10-CM

## 2018-10-19 DIAGNOSIS — Z87891 Personal history of nicotine dependence: Secondary | ICD-10-CM

## 2018-10-19 NOTE — Patient Instructions (Addendum)
Please call to schedule your imaging test at [phone].  You must call to schedule, they will not call you.  Preventive Care 65 Years and Older, Male Preventive care refers to lifestyle choices and visits with your health care provider that can promote health and wellness. What does preventive care include?  A yearly physical exam. This is also called an annual well check.  Dental exams once or twice a year.  Routine eye exams. Ask your health care provider how often you should have your eyes checked.  Personal lifestyle choices, including: ? Daily care of your teeth and gums. ? Regular physical activity. ? Eating a healthy diet. ? Avoiding tobacco and drug use. ? Limiting alcohol use. ? Practicing safe sex. ? Taking low doses of aspirin every day. ? Taking vitamin and mineral supplements as recommended by your health care provider. What happens during an annual well check? The services and screenings done by your health care provider during your annual well check will depend on your age, overall health, lifestyle risk factors, and family history of disease. Counseling Your health care provider may ask you questions about your:  Alcohol use.  Tobacco use.  Drug use.  Emotional well-being.  Home and relationship well-being.  Sexual activity.  Eating habits.  History of falls.  Memory and ability to understand (cognition).  Work and work Statistician.  Screening You may have the following tests or measurements:  Height, weight, and BMI.  Blood pressure.  Lipid and cholesterol levels. These may be checked every 5 years, or more frequently if you are over 19 years old.  Skin check.  Lung cancer screening. You may have this screening every year starting at age 15 if you have a 30-pack-year history of smoking and currently smoke or have quit within the past 15 years.  Fecal occult blood test (FOBT) of the stool. You may have this test every year starting at age  65.  Flexible sigmoidoscopy or colonoscopy. You may have a sigmoidoscopy every 5 years or a colonoscopy every 10 years starting at age 63.  Prostate cancer screening. Recommendations will vary depending on your family history and other risks.  Hepatitis C blood test.  Hepatitis B blood test.  Sexually transmitted disease (STD) testing.  Diabetes screening. This is done by checking your blood sugar (glucose) after you have not eaten for a while (fasting). You may have this done every 1-3 years.  Abdominal aortic aneurysm (AAA) screening. You may need this if you are a current or former smoker.  Osteoporosis. You may be screened starting at age 22 if you are at high risk.  Talk with your health care provider about your test results, treatment options, and if necessary, the need for more tests. Vaccines Your health care provider may recommend certain vaccines, such as:  Influenza vaccine. This is recommended every year.  Tetanus, diphtheria, and acellular pertussis (Tdap, Td) vaccine. You may need a Td booster every 10 years.  Varicella vaccine. You may need this if you have not been vaccinated.  Zoster vaccine. You may need this after age 6.  Measles, mumps, and rubella (MMR) vaccine. You may need at least one dose of MMR if you were born in 1957 or later. You may also need a second dose.  Pneumococcal 13-valent conjugate (PCV13) vaccine. One dose is recommended after age 103.  Pneumococcal polysaccharide (PPSV23) vaccine. One dose is recommended after age 1.  Meningococcal vaccine. You may need this if you have certain conditions.  Hepatitis  A vaccine. You may need this if you have certain conditions or if you travel or work in places where you may be exposed to hepatitis A.  Hepatitis B vaccine. You may need this if you have certain conditions or if you travel or work in places where you may be exposed to hepatitis B.  Haemophilus influenzae type b (Hib) vaccine. You may  need this if you have certain risk factors.  Talk to your health care provider about which screenings and vaccines you need and how often you need them. This information is not intended to replace advice given to you by your health care provider. Make sure you discuss any questions you have with your health care provider. Document Released: 12/18/2015 Document Revised: 08/10/2016 Document Reviewed: 09/22/2015 Elsevier Interactive Patient Education  [employer].

## 2018-10-19 NOTE — Progress Notes (Signed)
Name: Tom Underwood   MRN: [id number]    DOB: July 06, 1951   Date:10/19/2018       Progress Note  Subjective  Chief Complaint  Chief Complaint  Patient presents with  . Annual Exam    HPI  Patient presents for annual CPE.  USPSTF grade A and B recommendations:  Diet: Modified fiber diet - no nuts or veggies with seeds, seems to help his diverticulosis. 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 Depression screen [facility] 2/9 10/05/2018 05/02/2018 02/16/2018 12/01/2017 08/30/2017  Decreased Interest 1 0 0 0 0  Down, Depressed, Hopeless 1 0 1 0 0  PHQ - 2 Score 2 0 1 0 0  Altered sleeping 0 - - - -  Tired, decreased energy 0 - - - -  Change in appetite 1 - - - -  Feeling bad or failure about yourself  1 - - - -  Trouble concentrating 0 - - - -  Moving slowly or fidgety/restless 1 - - - -  Suicidal thoughts 0 - - - -  PHQ-9 Score 5 - - - -  Difficult doing work/chores Not difficult at all - - - -   Hypertension:  BP Readings from Last 3 Encounters:  10/19/18 (!) 150/90  10/05/18 138/70  05/11/18 (!) 154/86    Obesity: Wt Readings from Last 3 Encounters:  10/19/18 281 lb 14.4 oz (127.9 kg)  10/05/18 281 lb 1.6 oz (127.5 kg)  05/11/18 294 lb (133.4 kg)   BMI Readings from Last 3 Encounters:  10/19/18 45.50 kg/m  10/05/18 45.37 kg/m  05/11/18 47.45 kg/m     Lipids:  Lab Results  Component Value Date   CHOL 144 10/05/2018   CHOL 170 08/30/2017   CHOL 158 06/02/2017   Lab Results  Component Value Date   HDL 57 10/05/2018   HDL 62 08/30/2017   HDL 62 06/02/2017   Lab Results  Component Value Date   LDLCALC 75 10/05/2018   LDLCALC 95 08/30/2017   LDLCALC 85 06/02/2017   Lab Results  Component Value Date   TRIG 61 10/05/2018   TRIG 65  08/30/2017   TRIG 57 06/02/2017   Lab Results  Component Value Date   CHOLHDL 2.5 10/05/2018   CHOLHDL 2.7 08/30/2017   CHOLHDL 2.6 08/07/2015   No results found for: LDLDIRECT Glucose:  Glucose  Date Value Ref Range Status  10/05/2018 91 65 - 99 mg/dL Final  65/78/4696 295 (H) 65 - 99 mg/dL Final  28/41/3244 010 (H) 65 - 99 mg/dL Final     Office Visit from 10/19/2018 in [facility]  AUDIT-C Score  0     Married STD testing and prevention (HIV/chl/gon/syphilis): HIV negative 02/16/18.  Declines additional STI screening.   Hep C: Negative 02/16/18 Skin cancer: 1 flesh colored raised lesion to the right forearm, 1 flesh colored raised lesion to the LEFT shoulder - will refer to Derm - was referred in 2016 but never made the appointment. Colorectal cancer: 2014; repeat in 2024. No changes in BM's, no BRB or dark and tarry stools. Prostate cancer: PSA normal 02/16/2018 Lab Results  Component Value Date   PSA 0.25 03/10/2017   IPSS Questionnaire (AUA-7): Over the past month.  1)  How often have you had a sensation of not emptying your bladder completely after you finish urinating?  0 - Not at all  2)  How often have you had to urinate again less than two hours after you finished urinating? 1 - Less than 1 time in 5  3)  How often have you found you stopped and started again several times when you urinated?  0 - Not at all  4) How difficult have you found it to postpone urination?  0 - Not at all  5) How often have you had a weak urinary stream?  0 - Not at all  6) How often have you had to push or strain to begin urination?  0 - Not at all  7) How many times did you most typically get up to urinate from the time you went to bed until the time you got up in the morning?  0 - None  Total score:  0-7 mildly symptomatic   8-19 moderately symptomatic   20-35 severely symptomatic    Lung cancer: Former smoker - well over 15 years ago. Low Dose CT Chest recommended  if Age 37-80 years, 30 pack-year currently smoking OR have quit w/in 15years. Patient does not qualify.   AAA: Qualifies - The USPSTF recommends one-time screening with ultrasonography in men ages 8565 to 5175 years who have ever smoked ECG:  On file from 2012; denies chest pain/shortness of breath/palpitations  Advanced Care Planning: A voluntary discussion about advance care planning including the explanation and discussion of advance directives.  Discussed health care proxy and Living will, and the patient was able to identify a health care proxy as Blossom HoopsDonna Keetch.  Patient does not have a living will at present time. If patient does have living will, I have requested they bring this to the clinic to be scanned in to their chart.  Patient Active Problem List   Diagnosis Date Noted  . History of kidney stones 05/02/2018  . ED (erectile dysfunction) 05/02/2018  . Hyperglycemia 01/30/2017  . Arthritis of knee, degenerative 01/30/2017  . BPH with obstruction/lower urinary tract symptoms 02/03/2016  . Gross hematuria 02/03/2016  . Dyslipidemia 08/04/2015  . Skin lesions 07/14/2015  . Diverticulitis of large intestine without perforation or abscess without bleeding 06/30/2015  . Edema 06/30/2015  . OSA on CPAP 06/30/2015  . Kidney pain 06/30/2015    Past Surgical History:  Procedure Laterality Date  . EXTERNAL EAR SURGERY    . FINGER SURGERY    . KNEE SURGERY    . left eye      Family History  Problem Relation Age of Onset  . Asthma Mother   . Heart disease Mother   . Heart attack Mother   . Asthma Father   . Heart disease Father   . Stroke Father     Social History   Socioeconomic History  . Marital status: Married    Spouse name: Lupita LeashDonna   . Number of children: 2  . Years of education: Not on file  . Highest education level: Not on file  Occupational History  . Occupation: Regulatory affairs officerpawn broker   Social Needs  . Financial resource strain: Not hard at all  . Food insecurity:     Worry: Never true    Inability: Never true  . Transportation needs:    Medical: No    Non-medical: No  Tobacco Use  . Smoking status: Former Smoker    Types: Pipe  . Smokeless  tobacco: Never Used  Substance and Sexual Activity  . Alcohol use: No    Alcohol/week: 0.0 standard drinks  . Drug use: No  . Sexual activity: Yes    Partners: Female  Lifestyle  . Physical activity:    Days per week: 0 days    Minutes per session: 0 min  . Stress: Not on file  Relationships  . Social connections:    Talks on phone: Not on file    Gets together: Not on file    Attends religious service: Not on file    Active member of club or organization: Not on file    Attends meetings of clubs or organizations: Not on file    Relationship status: Not on file  . Intimate partner violence:    Fear of current or ex partner: No    Emotionally abused: No    Physically abused: No    Forced sexual activity: No  Other Topics Concern  . Not on file  Social History Narrative  . Not on file     Current Outpatient Medications:  .  aspirin EC 81 MG tablet, Take 1 tablet (81 mg total) by mouth daily., Disp: 30 tablet, Rfl: 0 .  meloxicam (MOBIC) 15 MG tablet, Take 1 tablet (15 mg total) by mouth daily., Disp: 30 tablet, Rfl: 2 .  Multiple Vitamin (MULTIVITAMIN) tablet, Take 1 tablet by mouth daily., Disp: , Rfl:  .  omega-3 acid ethyl esters (LOVAZA) 1 g capsule, Take 2 capsules (2 g total) by mouth 2 (two) times daily., Disp: 360 capsule, Rfl: 1 .  rosuvastatin (CRESTOR) 5 MG tablet, Take 1 tablet (5 mg total) by mouth at bedtime., Disp: 90 tablet, Rfl: 1 .  Saccharomyces boulardii (PROBIOTIC) 250 MG CAPS, Take 1 capsule by mouth daily., Disp: 30 capsule, Rfl: 0 .  sildenafil (REVATIO) 20 MG tablet, Take 3 to 5 tablets two hours before intercouse on an empty stomach.  Do not take with nitrates., Disp: 50 tablet, Rfl: 3 .  valsartan (DIOVAN) 80 MG tablet, Take 1 tablet (80 mg total) by mouth daily., Disp:  90 tablet, Rfl: 1 .  amitriptyline (ELAVIL) 25 MG tablet, Take 1 tablet (25 mg total) by mouth at bedtime as needed for sleep., Disp: 90 tablet, Rfl: 0  Allergies  Allergen Reactions  . Penicillins Itching     ROS  Constitutional: Negative for fever or weight change.  Respiratory: Negative for cough and shortness of breath.   Cardiovascular: Negative for chest pain or palpitations.  Gastrointestinal: Negative for abdominal pain, no bowel changes.  Musculoskeletal: Negative for gait problem or joint swelling.  Skin: Negative for rash.  Neurological: Negative for dizziness or headache.  No other specific complaints in a complete review of systems (except as listed in HPI above).  Objective  Vitals:   10/19/18 0807  BP: (!) 150/90  Pulse: 84  Resp: 16  Temp: 98.3 F (36.8 C)  TempSrc: Oral  SpO2: 97%  Weight: 281 lb 14.4 oz (127.9 kg)  Height: 5\' 6"  (1.676 m)    Body mass index is 45.5 kg/m.  Physical Exam  Constitutional: Patient appears well-developed and well-nourished. No distress.  HENT: Head: Normocephalic and atraumatic. Ears: B TMs ok, no erythema or effusion; Nose: Nose normal. Mouth/Throat: Oropharynx is clear and moist. No oropharyngeal exudate.  Eyes: Conjunctivae and EOM are normal. Pupils are equal, round, and reactive to light. No scleral icterus.  Neck: Normal range of motion. Neck supple. No JVD present. No  thyromegaly present.  Cardiovascular: Normal rate, regular rhythm and normal heart sounds.  No murmur heard. No BLE edema. Pulmonary/Chest: Effort normal and breath sounds normal. No respiratory distress. Abdominal: Soft. Bowel sounds are normal, no distension. There is no tenderness. no masses MALE GENITALIA: Deferred. Musculoskeletal: Normal range of motion, no joint effusions. No gross deformities Neurological: he is alert and oriented to person, place, and time. No cranial nerve deficit. Coordination, balance, strength, speech and gait are normal.   Skin: Skin is warm and dry. No rash noted. No erythema. Small raised, round, flesh colored lesion to the right forearm and similar appearing lesion to the LEFT shoulder. Psychiatric: Patient has a normal mood and affect. behavior is normal. Judgment and thought content normal.  Recent Results (from the past 2160 hour(s))  Lipid panel     Status: None   Collection Time: 10/05/18  1:06 PM  Result Value Ref Range   Cholesterol, Total 144 100 - 199 mg/dL   Triglycerides 61 0 - 149 mg/dL   HDL 57 >96 mg/dL   VLDL Cholesterol Cal 12 5 - 40 mg/dL   LDL Calculated 75 0 - 99 mg/dL   Chol/HDL Ratio 2.5 0.0 - 5.0 ratio    Comment:                                   T. Chol/HDL Ratio                                             Men  Women                               1/2 Avg.Risk  3.4    3.3                                   Avg.Risk  5.0    4.4                                2X Avg.Risk  9.6    7.1                                3X Avg.Risk 23.4   11.0   Hemoglobin A1c     Status: None   Collection Time: 10/05/18  1:06 PM  Result Value Ref Range   Hgb A1c MFr Bld 5.4 4.8 - 5.6 %    Comment:          Prediabetes: 5.7 - 6.4          Diabetes: >6.4          Glycemic control for adults with diabetes: <7.0    Est. average glucose Bld gHb Est-mCnc 108 mg/dL  Comprehensive metabolic panel     Status: Abnormal   Collection Time: 10/05/18  1:06 PM  Result Value Ref Range   Glucose 91 65 - 99 mg/dL   BUN 14 8 - 27 mg/dL   Creatinine, Ser 0.45 0.76 - 1.27 mg/dL   GFR calc non Af Amer 90 >59 mL/min/1.73  GFR calc Af Amer 104 >59 mL/min/1.73   BUN/Creatinine Ratio 16 10 - 24   Sodium 141 134 - 144 mmol/L   Potassium 4.5 3.5 - 5.2 mmol/L   Chloride 98 96 - 106 mmol/L   CO2 26 20 - 29 mmol/L   Calcium 10.0 8.6 - 10.2 mg/dL   Total Protein 7.6 6.0 - 8.5 g/dL   Albumin 5.0 (H) 3.6 - 4.8 g/dL   Globulin, Total 2.6 1.5 - 4.5 g/dL   Albumin/Globulin Ratio 1.9 1.2 - 2.2   Bilirubin Total 1.0 0.0 -  1.2 mg/dL   Alkaline Phosphatase 112 39 - 117 IU/L   AST 21 0 - 40 IU/L   ALT 25 0 - 44 IU/L   PHQ2/9: Depression screen [facility] 2/9 10/05/2018 05/02/2018 02/16/2018 12/01/2017 08/30/2017  Decreased Interest 1 0 0 0 0  Down, Depressed, Hopeless 1 0 1 0 0  PHQ - 2 Score 2 0 1 0 0  Altered sleeping 0 - - - -  Tired, decreased energy 0 - - - -  Change in appetite 1 - - - -  Feeling bad or failure about yourself  1 - - - -  Trouble concentrating 0 - - - -  Moving slowly or fidgety/restless 1 - - - -  Suicidal thoughts 0 - - - -  PHQ-9 Score 5 - - - -  Difficult doing work/chores Not difficult at all - - - -   Fall Risk: Fall Risk  10/19/2018 10/05/2018 05/02/2018 02/16/2018 12/01/2017  Falls in the past year? 0 0 No No No  Number falls in past yr: - 0 - - -  Injury with Fall? - 0 - - -    Assessment & Plan  1. Encounter for annual physical exam -Prostate cancer screening and PSA options (with potential risks and benefits of testing vs not testing) were discussed along with recent recs/guidelines. -USPSTF grade A and B recommendations reviewed with patient; age-appropriate recommendations, preventive care, screening tests, etc discussed and encouraged; healthy living encouraged; see AVS for patient education given to patient -Discussed importance of 150 minutes of physical activity weekly, eat two servings of fish weekly, eat one serving of tree nuts ( cashews, pistachios, pecans, almonds.Marland Kitchen) every other day, eat 6 servings of fruit/vegetables daily and drink plenty of water and avoid sweet beverages.   2. Need for 23-polyvalent pneumococcal polysaccharide vaccine - Pneumococcal polysaccharide vaccine 23-valent greater than or equal to 2yo subcutaneous/IM  3. Dyslipidemia - Stable  4. Hyperglycemia - Stable; normal A1C 10/05/18  5. Skin lesions - Ambulatory referral to Dermatology  6. Former smoker - US ABDOMINAL AORTA SCREENING AAA; Future

## 2018-10-24 ENCOUNTER — Ambulatory Visit: Payer: Managed Care, Other (non HMO)

## 2018-10-25 ENCOUNTER — Ambulatory Visit
Admission: RE | Admit: 2018-10-25 | Discharge: 2018-10-25 | Disposition: A | Discharge status: Home / Self Care | Payer: Managed Care, Other (non HMO) | Source: Ambulatory Visit | Attending: Family Medicine | Admitting: Family Medicine

## 2018-10-25 DIAGNOSIS — Z87891 Personal history of nicotine dependence: Secondary | ICD-10-CM

## 2018-10-25 DIAGNOSIS — Z136 Encounter for screening for cardiovascular disorders: Secondary | ICD-10-CM | POA: Diagnosis present

## 2018-11-08 ENCOUNTER — Encounter: Payer: Self-pay | Admitting: Podiatry

## 2018-11-08 ENCOUNTER — Ambulatory Visit: Payer: Managed Care, Other (non HMO) | Admitting: Podiatry

## 2018-11-08 DIAGNOSIS — M129 Arthropathy, unspecified: Secondary | ICD-10-CM

## 2018-11-08 DIAGNOSIS — M2020 Hallux rigidus, unspecified foot: Secondary | ICD-10-CM

## 2018-11-08 DIAGNOSIS — M205X9 Other deformities of toe(s) (acquired), unspecified foot: Secondary | ICD-10-CM

## 2018-11-08 NOTE — Progress Notes (Signed)
This patient presents to the office for continued evaluation of his painful feet.  He says that following the last visit he had his orthotics adjusted and a kinetic wedge was applied to both orthoses.  He also has been taking Mobic by mouth daily.  He also relates applying ice to his ankles at night to sleep.  He says he is doing much better but he still works 10 to 12 hours a day at work.  He says he is 60% improved with these changes.  He is very pleased with his improvement .  He presents the office for continued evaluation and treatment of his feet  Vascular  Dorsalis pedis and posterior tibial pulses are palpable  B/L.  Capillary return  WNL.  Temperature gradient is  WNL.  Skin turgor  WNL  Sensorium  Senn Weinstein monofilament wire  WNL. Normal tactile sensation.  Nail Exam  Patient has normal nails with no evidence of bacterial or fungal infection.  Orthopedic  Exam  Muscle tone and muscle strength  WNL.  No limitations of motion feet  B/L.  No crepitus or joint effusion noted.  Hallux limitus 1st MPJ  B/L.  Dorsal spurring noted  B/L.   Palpable pain on the anterior aspect ankle  B/L.    Skin  No open lesions.  Normal skin texture and turgor.  Ankle arthritis  Hallux limitus 1st MPJ  B/L  ROV.   Patient was told to continue to wear his orthoses to take his Mobic and to ice his foot and ankle.  Patient should consider a new pair of orthoses in the future as needed discussed this condition with this patient.  Told him that he is already a surgical candidate but these are conservative treatments that I am providing to help prevent surgical intervention at this time.   RTC 4 weeks for continued evaluation.   Helane GuntherGregory Cleburne Savini DPM

## 2018-12-06 ENCOUNTER — Other Ambulatory Visit: Payer: Self-pay

## 2018-12-06 DIAGNOSIS — G4700 Insomnia, unspecified: Secondary | ICD-10-CM

## 2018-12-06 MED ORDER — AMITRIPTYLINE HCL 25 MG PO TABS: 25.0000 mg | ORAL_TABLET | Freq: Every evening | ORAL | 0 refills | Status: DC | PRN

## 2018-12-06 NOTE — Telephone Encounter (Signed)
Refill request for general medication: Amitriptyline 25 mg  Last office visit: 10/19/18  Last physical exam: 10/19/18  Follow-ups on file. 02/21/2019

## 2018-12-12 ENCOUNTER — Encounter: Payer: Managed Care, Other (non HMO) | Admitting: Family Medicine

## 2019-01-07 ENCOUNTER — Telehealth: Payer: Self-pay

## 2019-01-07 NOTE — Telephone Encounter (Signed)
His level has been very well controlled. He can stop taking Lovaza and we will recheck it without medication on his next visit to see what medication to re-start him on.  Thank you

## 2019-01-07 NOTE — Telephone Encounter (Signed)
Prior Authorization was initiated for LOVAZA 1 G and a temporary supply of this medication will be provided up to 30 days. Please advise. Should he buy OTC.

## 2019-01-07 NOTE — Telephone Encounter (Signed)
Called patient to inform him to discontinue Lovaza. He reports that he no longer has [place] and is on Medicare. Our records do not indicate he is on Medicare. So it may be covered with new insurance. He didn't want to stop taking the medication after I informed him of what you said. Told him to purchase otc he doesn't like OTC medications.

## 2019-01-08 NOTE — Telephone Encounter (Signed)
Called patient and left a message, he needs to bring his new insurance card, can try OtC and if levels goes up we can try another PA if not covered by new insurance

## 2019-01-17 ENCOUNTER — Telehealth: Payer: Self-pay

## 2019-01-17 NOTE — Telephone Encounter (Addendum)
What do they pay for triglycerides. Fenofibrate? They may have to try otc fish oil. I am sorry

## 2019-01-17 NOTE — Telephone Encounter (Signed)
Patient maybe able to continue to stay on medication once he updates his insurance.

## 2019-01-17 NOTE — Telephone Encounter (Signed)
Prior Authorization was initiated for Omega 3-Acid Ethyl Esters 1 gm capsules and Denied. The medication is is not on Formulary.

## 2019-02-12 ENCOUNTER — Other Ambulatory Visit: Payer: Self-pay | Admitting: Podiatry

## 2019-02-21 ENCOUNTER — Ambulatory Visit (INDEPENDENT_AMBULATORY_CARE_PROVIDER_SITE_OTHER): Payer: Medicare Other | Admitting: Family Medicine

## 2019-02-21 ENCOUNTER — Other Ambulatory Visit: Payer: Self-pay

## 2019-02-21 ENCOUNTER — Ambulatory Visit: Payer: Managed Care, Other (non HMO) | Admitting: Family Medicine

## 2019-02-21 ENCOUNTER — Encounter: Payer: Self-pay | Admitting: Family Medicine

## 2019-02-21 VITALS — BP 140/70 | HR 70 | Temp 98.0°F | Resp 16 | Ht 66.0 in | Wt 281.3 lb

## 2019-02-21 DIAGNOSIS — Z0000 Encounter for general adult medical examination without abnormal findings: Secondary | ICD-10-CM

## 2019-02-21 NOTE — Patient Instructions (Signed)
Preventive Care 2 Years and Older, Male Preventive care refers to lifestyle choices and visits with your health care provider that can promote health and wellness. What does preventive care include?   A yearly physical exam. This is also called an annual well check.  Dental exams once or twice a year.  Routine eye exams. Ask your health care provider how often you should have your eyes checked.  Personal lifestyle choices, including: ? Daily care of your teeth and gums. ? Regular physical activity. ? Eating a healthy diet. ? Avoiding tobacco and drug use. ? Limiting alcohol use. ? Practicing safe sex. ? Taking low doses of aspirin every day. ? Taking vitamin and mineral supplements as recommended by your health care provider. What happens during an annual well check? The services and screenings done by your health care provider during your annual well check will depend on your age, overall health, lifestyle risk factors, and family history of disease. Counseling Your health care provider may ask you questions about your:  Alcohol use.  Tobacco use.  Drug use.  Emotional well-being.  Home and relationship well-being.  Sexual activity.  Eating habits.  History of falls.  Memory and ability to understand (cognition).  Work and work Statistician. Screening You may have the following tests or measurements:  Height, weight, and BMI.  Blood pressure.  Lipid and cholesterol levels. These may be checked every 5 years, or more frequently if you are over 9 years old.  Skin check.  Lung cancer screening. You may have this screening every year starting at age 57 if you have a 30-pack-year history of smoking and currently smoke or have quit within the past 15 years.  Colorectal cancer screening. All adults should have this screening starting at age 90 and continuing until age 69. You will have tests every 1-10 years, depending on your results and the type of screening  test. People at increased risk should start screening at an earlier age. Screening tests may include: ? Guaiac-based fecal occult blood testing. ? Fecal immunochemical test (FIT). ? Stool DNA test. ? Virtual colonoscopy. ? Sigmoidoscopy. During this test, a flexible tube with a tiny camera (sigmoidoscope) is used to examine your rectum and lower colon. The sigmoidoscope is inserted through your anus into your rectum and lower colon. ? Colonoscopy. During this test, a long, thin, flexible tube with a tiny camera (colonoscope) is used to examine your entire colon and rectum.  Prostate cancer screening. Recommendations will vary depending on your family history and other risks.  Hepatitis C blood test.  Hepatitis B blood test.  Sexually transmitted disease (STD) testing.  Diabetes screening. This is done by checking your blood sugar (glucose) after you have not eaten for a while (fasting). You may have this done every 1-3 years.  Abdominal aortic aneurysm (AAA) screening. You may need this if you are a current or former smoker.  Osteoporosis. You may be screened starting at age 30 if you are at high risk. Talk with your health care provider about your test results, treatment options, and if necessary, the need for more tests. Vaccines Your health care provider may recommend certain vaccines, such as:  Influenza vaccine. This is recommended every year.  Tetanus, diphtheria, and acellular pertussis (Tdap, Td) vaccine. You may need a Td booster every 10 years.  Varicella vaccine. You may need this if you have not been vaccinated.  Zoster vaccine. You may need this after age 42.  Measles, mumps, and rubella (MMR) vaccine.  You may need at least one dose of MMR if you were born in 1957 or later. You may also need a second dose.  Pneumococcal 13-valent conjugate (PCV13) vaccine. One dose is recommended after age 65.  Pneumococcal polysaccharide (PPSV23) vaccine. One dose is recommended  after age 65.  Meningococcal vaccine. You may need this if you have certain conditions.  Hepatitis A vaccine. You may need this if you have certain conditions or if you travel or work in places where you may be exposed to hepatitis A.  Hepatitis B vaccine. You may need this if you have certain conditions or if you travel or work in places where you may be exposed to hepatitis B.  Haemophilus influenzae type b (Hib) vaccine. You may need this if you have certain risk factors. Talk to your health care provider about which screenings and vaccines you need and how often you need them. This information is not intended to replace advice given to you by your health care provider. Make sure you discuss any questions you have with your health care provider. Document Released: 12/18/2015 Document Revised: 01/11/2018 Document Reviewed: 09/22/2015 Elsevier Interactive Patient Education  2019 [employer]  

## 2019-02-21 NOTE — Progress Notes (Signed)
Patient: Tom Underwood, Male    DOB: December 13, 1950, 68 y.o.   MRN: [id number]  Visit Date: 02/21/2019  Today's Provider: Ruel Favors, MD   Chief Complaint  Patient presents with  . Medicare Wellness    Subjective:   Tom Underwood is a 68 y.o. male who presents today for Welcome to [facility]   Patient/Caregiver input:  He has noticed hand tremors more often since his anxiety has been high about COVID-19   HPI  Review of Systems   Constitutional: Negative for fever or weight change.  Respiratory: Negative for cough and shortness of breath.   Cardiovascular: Negative for chest pain or palpitations.  Gastrointestinal: Negative for abdominal pain, no bowel changes.  Musculoskeletal: Negative for gait problem or joint swelling.  Skin: Negative for rash.  Neurological: Negative for dizziness or headache. He has noticed hand tremors, but thinks related to stress, discussed referral to neurologist if it does not resolve  No other specific complaints in a complete review of systems (except as listed in HPI above).  Past Medical History:  Diagnosis Date  . Decreased libido   . Diverticulitis   . HLD (hyperlipidemia)   . Hydronephrosis with renal and ureteral calculus obstruction   . Hydronephrosis with renal and ureteral calculus obstruction   . Hypogonadism in male   . Rhinitis, allergic   . Sleep apnea   . Ureteral stone     Past Surgical History:  Procedure Laterality Date  . EXTERNAL EAR SURGERY    . FINGER SURGERY    . KNEE SURGERY    . left eye      Family History  Problem Relation Age of Onset  . Asthma Mother   . Heart disease Mother   . Heart attack Mother   . Dementia Mother   . Asthma Father   . Heart disease Father   . Stroke Father     Social History   Socioeconomic History  . Marital status: Married    Spouse name: [name]   . Number of children: 2  . Years of education: Not on file  . Highest education level: Not on file  Occupational  History  . Occupation: Regulatory affairs officer  . Financial resource strain: Not hard at all  . Food insecurity:    Worry: Never true    Inability: Never true  . Transportation needs:    Medical: No    Non-medical: No  Tobacco Use  . Smoking status: Former Smoker    Types: Pipe    Start date: 02/20/1973    Last attempt to quit: 02/20/1974    Years since quitting: 45.0  . Smokeless tobacco: Never Used  Substance and Sexual Activity  . Alcohol use: No    Alcohol/week: 0.0 standard drinks  . Drug use: No  . Sexual activity: Yes    Partners: Female  Lifestyle  . Physical activity:    Days per week: 0 days    Minutes per session: 0 min  . Stress: Not at all  Relationships  . Social connections:    Talks on phone: More than three times a week    Gets together: Twice a week    Attends religious service: More than 4 times per year    Active member of club or organization: Yes    Attends meetings of clubs or organizations: More than 4 times per year    Relationship status: Married  . Intimate partner violence:    Fear  of current or ex partner: No    Emotionally abused: No    Physically abused: No    Forced sexual activity: No  Other Topics Concern  . Not on file  Social History Narrative  . Not on file    Outpatient Encounter Medications as of 02/21/2019  Medication Sig  . amitriptyline (ELAVIL) 25 MG tablet Take 1 tablet (25 mg total) by mouth at bedtime as needed for sleep.  Marland Kitchen aspirin EC 81 MG tablet Take 1 tablet (81 mg total) by mouth daily.  . meloxicam (MOBIC) 15 MG tablet Take 1 tablet (15 mg total) by mouth daily.  . Multiple Vitamin (MULTIVITAMIN) tablet Take 1 tablet by mouth daily.  . rosuvastatin (CRESTOR) 5 MG tablet Take 1 tablet (5 mg total) by mouth at bedtime.  . Saccharomyces boulardii (PROBIOTIC) 250 MG CAPS Take 1 capsule by mouth daily.  . sildenafil (REVATIO) 20 MG tablet Take 3 to 5 tablets two hours before intercouse on an empty stomach.  Do not  take with nitrates.  . valsartan (DIOVAN) 80 MG tablet Take 1 tablet (80 mg total) by mouth daily.  . [DISCONTINUED] omega-3 acid ethyl esters (LOVAZA) 1 g capsule Take 2 capsules (2 g total) by mouth 2 (two) times daily.   No facility-administered encounter medications on file as of 02/21/2019.     Allergies  Allergen Reactions  . Penicillins Itching    Care Team Updated in EHR: Yes  Last Vision Exam:  [facility]. Wears corrective lenses: Yes Last Dental Exam: Dr. Iven Finn, up to date  Last Hearing Exam: today and normal. Wears Hearing Aids: No  Functional Ability / Safety Screening 1.  Was the timed Get Up and Go test longer than 30 seconds?  no 2.  Does the patient need help with the phone, transportation, shopping,      preparing meals, housework, laundry, medications, or managing money?  no 3.  Does the patient's home have:  loose throw rugs in the hallway?   yes      Grab bars in the bathroom? yes      Handrails on the stairs?   yes      Poor lighting?   yes 4.  Has the patient noticed any hearing difficulties?   no  Diet Recall and Exercise Regimen: he is eating more chicken and less red meat, adding catfish when he eats out, needs to increase fruit and vegetables in his diet. He is only walking at work.   Fall Risk: no falls in the past year. See screening under Objective Information  Depression Screen: negative phq 9  Advanced Directives: A voluntary discussion about advance care planning including the explanation and discussion of advance directives was discussed with the patient. Explanation about the health care proxy and living will was reviewed.  During this discussion, the patient was able to identify a health care proxy as wife Does patient have a HCPOA?    yes If yes, name and contact information:  Django Nguyen  Does patient have a living will or MOST form?  no  Cancer Screenings:  Skin: discussed atypical lesions  Lung: Low Dose CT Chest recommended if  Age 17-80 years, 30 pack-year currently smoking OR have quit w/in 15years. Patient does not qualify.  Lifestyle risk factor issued reviewed: Diet, exercise, weight management, advised patient smoking is not healthy, nutrition/diet.   Prostate: he goes to [facility]  Colon: 12/2012  Additional Screenings:  Hepatitis B/HIV/Syphillis: Hepatitis C Screening: 02/16/2018 Intimate  Partner Violence:  AAA Screen: Men age 80 to 81 years if ever smoked recommended to get a one time AAA ultrasound screening exam. Patient does not qualify.  Objective:   Vitals: BP 140/70 (BP Location: Left Arm, Patient Position: Sitting, Cuff Size: Normal)   Pulse 70   Temp 98 F (36.7 C) (Oral)   Resp 16   Ht 5\' 6"  (1.676 m)   Wt 281 lb 4.8 oz (127.6 kg)   SpO2 95%   BMI 45.40 kg/m  Body mass index is 45.4 kg/m.  Lab Results  Component Value Date   CHOL 144 10/05/2018   CHOL 170 08/30/2017   CHOL 158 06/02/2017   Lab Results  Component Value Date   HDL 57 10/05/2018   HDL 62 08/30/2017   HDL 62 06/02/2017   Lab Results  Component Value Date   LDLCALC 75 10/05/2018   LDLCALC 95 08/30/2017   LDLCALC 85 06/02/2017   Lab Results  Component Value Date   TRIG 61 10/05/2018   TRIG 65 08/30/2017   TRIG 57 06/02/2017   Lab Results  Component Value Date   CHOLHDL 2.5 10/05/2018   CHOLHDL 2.7 08/30/2017   CHOLHDL 2.6 08/07/2015   No results found for: LDLDIRECT   Hearing Screening   125Hz  250Hz  500Hz  1000Hz  2000Hz  3000Hz  4000Hz  6000Hz  8000Hz   Right ear:   Pass Pass Pass  Pass    Left ear:   Pass Pass Pass  Pass      Visual Acuity Screening   Right eye Left eye Both eyes  Without correction:     With correction: 20 20 20 30 20 20     Physical Exam Constitutional: Patient appears well-developed and well-nourished. Obese  No distress.  HEENT: head atraumatic, normocephalic, pupils equal and reactive to light,  neck supple, throat within normal limits Cardiovascular: Normal rate,  regular rhythm and normal heart sounds.  No murmur heard. No BLE edema. Pulmonary/Chest: Effort normal and breath sounds normal. No respiratory distress. Abdominal: Soft.  There is no tenderness. Psychiatric: Patient has a normal mood and affect. behavior is normal. Judgment and thought content normal. Neurological: hand tremors , at rest. Pill rolling    Cognitive Testing - 6-CIT  Correct? Score   What year is it? yes 0 Yes = 0    No = 4  What month is it? yes 0 Yes = 0    No = 3  Remember:     Floyde Parkins, [address], [place]     What time is it? yes 0 Yes = 0    No = 3  Count backwards from 20 to 1 yes 0 Correct = 0    1 error = 2   More than 1 error = 4  Say the months of the year in reverse. yes 0 Correct = 0    1 error = 2   More than 1 error = 4  What address did I ask you to remember? yes 0 Correct = 0  1 error = 2    2 error = 4    3 error = 6    4 error = 8    All wrong = 10       TOTAL SCORE  0/28   Interpretation:  Normal  Normal (0-7) Abnormal (8-28)   Fall Risk: Fall Risk  02/21/2019 10/19/2018 10/05/2018 05/02/2018 02/16/2018  Falls in the past year? 0 0 0 No No  Number falls in past yr:  0 - 0 - -  Injury with Fall? 0 - 0 - -    Depression Screen Depression screen [facility] 2/9 02/21/2019 10/19/2018 10/05/2018 05/02/2018 02/16/2018  Decreased Interest 0 0 1 0 0  Down, Depressed, Hopeless 0 0 1 0 1  PHQ - 2 Score 0 0 2 0 1  Altered sleeping 0 - 0 - -  Tired, decreased energy 0 - 0 - -  Change in appetite 0 - 1 - -  Feeling bad or failure about yourself  0 - 1 - -  Trouble concentrating 0 - 0 - -  Moving slowly or fidgety/restless 0 - 1 - -  Suicidal thoughts 0 - 0 - -  PHQ-9 Score 0 - 5 - -  Difficult doing work/chores - - Not difficult at all - -    No results found for this or any previous visit (from the past 2160 hour(s)).   Assessment & Plan:     1. Welcome to Medicare preventive visit  - EKG 12-Lead - normal today sinus rhythm  type dotphrase  "dot"diagmed to refresh this list  Exercise Activities and Dietary recommendations  Discussed staying active once he retires in 04/2019 Eat more fruit and vegetables daily   Discussed health benefits of physical activity, and encouraged him to engage in regular exercise appropriate for his age and condition.   Immunization History  Administered Date(s) Administered  . Influenza, High Dose Seasonal PF 10/05/2018  . Influenza,inj,Quad PF,6+ Mos 08/30/2017  . Influenza-Unspecified 09/26/2016  . Pneumococcal Polysaccharide-23 10/19/2018  . Tdap 02/16/2018  . Zoster 02/24/2017    Health Maintenance  Topic Date Due  . PNA vac Low Risk Adult (2 of 2 - PCV13) 10/20/2019  . COLONOSCOPY  12/05/2022  . TETANUS/TDAP  02/17/2028  . INFLUENZA VACCINE  Completed  . Hepatitis C Screening  Completed  . HIV Screening  Completed     No orders of the defined types were placed in this encounter.   Current Outpatient Medications:  .  amitriptyline (ELAVIL) 25 MG tablet, Take 1 tablet (25 mg total) by mouth at bedtime as needed for sleep., Disp: 90 tablet, Rfl: 0 .  aspirin EC 81 MG tablet, Take 1 tablet (81 mg total) by mouth daily., Disp: 30 tablet, Rfl: 0 .  meloxicam (MOBIC) 15 MG tablet, Take 1 tablet (15 mg total) by mouth daily., Disp: 30 tablet, Rfl: 2 .  Multiple Vitamin (MULTIVITAMIN) tablet, Take 1 tablet by mouth daily., Disp: , Rfl:  .  rosuvastatin (CRESTOR) 5 MG tablet, Take 1 tablet (5 mg total) by mouth at bedtime., Disp: 90 tablet, Rfl: 1 .  Saccharomyces boulardii (PROBIOTIC) 250 MG CAPS, Take 1 capsule by mouth daily., Disp: 30 capsule, Rfl: 0 .  sildenafil (REVATIO) 20 MG tablet, Take 3 to 5 tablets two hours before intercouse on an empty stomach.  Do not take with nitrates., Disp: 50 tablet, Rfl: 3 .  valsartan (DIOVAN) 80 MG tablet, Take 1 tablet (80 mg total) by mouth daily., Disp: 90 tablet, Rfl: 1 Medications Discontinued During This Encounter  Medication Reason  .  omega-3 acid ethyl esters (LOVAZA) 1 g capsule Cost of medication    I have personally reviewed and addressed the Medicare Annual Wellness health risk assessment questionnaire and have noted the following in the patient's chart:  A.         Medical and social history & family history B.         Use of alcohol, tobacco or  illicit drugs  C.         Current medications and supplements D.         Functional and Cognitive ability and status E.         Nutritional status F.         Physical activity G.        Advance directives H.         List of other physicians I.          Hospitalizations, surgeries, and ER visits in previous 12 months J.         Vitals K.         Screenings such as hearing and vision if needed, cognitive and depression L.         Referrals and appointments - after COVID - 19 we will refer him to Neurologist   In addition, I have reviewed and discussed with patient certain preventive protocols, quality metrics, and best practice recommendations. A written personalized care plan for preventive services as well as general preventive health recommendations were provided to patient.   See attached scanned questionnaire for additional information.

## 2019-04-05 ENCOUNTER — Other Ambulatory Visit: Payer: Self-pay | Admitting: Family Medicine

## 2019-04-05 ENCOUNTER — Other Ambulatory Visit: Payer: Self-pay | Admitting: Podiatry

## 2019-04-05 DIAGNOSIS — G4700 Insomnia, unspecified: Secondary | ICD-10-CM

## 2019-04-05 DIAGNOSIS — E7800 Pure hypercholesterolemia, unspecified: Secondary | ICD-10-CM

## 2019-04-05 NOTE — Telephone Encounter (Signed)
Refill Request for Cholesterol medication. Crestor 5 mg   Lab Results  Component Value Date   CHOL 144 10/05/2018   HDL 57 10/05/2018   LDLCALC 75 10/05/2018   TRIG 61 10/05/2018   CHOLHDL 2.5 10/05/2018    Follow up: 04/19/2019

## 2019-04-18 ENCOUNTER — Other Ambulatory Visit: Payer: Self-pay | Admitting: Podiatry

## 2019-04-19 ENCOUNTER — Ambulatory Visit: Payer: Medicare Other | Admitting: Family Medicine

## 2019-04-23 ENCOUNTER — Other Ambulatory Visit: Payer: Self-pay

## 2019-04-23 DIAGNOSIS — E7800 Pure hypercholesterolemia, unspecified: Secondary | ICD-10-CM

## 2019-04-23 MED ORDER — ROSUVASTATIN CALCIUM 5 MG PO TABS: 5.0000 mg | ORAL_TABLET | Freq: Every day | ORAL | 1 refills | Status: DC

## 2019-04-23 NOTE — Telephone Encounter (Signed)
[facility] is asking we sent in Rosuvastatin 90 day supply instead of 30

## 2019-05-16 ENCOUNTER — Ambulatory Visit: Payer: Managed Care, Other (non HMO) | Admitting: Urology

## 2019-05-20 ENCOUNTER — Ambulatory Visit: Payer: PRIVATE HEALTH INSURANCE | Admitting: Urology

## 2019-06-19 ENCOUNTER — Other Ambulatory Visit: Payer: Self-pay

## 2019-06-19 DIAGNOSIS — N400 Enlarged prostate without lower urinary tract symptoms: Secondary | ICD-10-CM

## 2019-06-20 ENCOUNTER — Other Ambulatory Visit: Payer: Self-pay | Admitting: Urology

## 2019-06-20 DIAGNOSIS — N400 Enlarged prostate without lower urinary tract symptoms: Secondary | ICD-10-CM | POA: Diagnosis not present

## 2019-06-21 LAB — PSA: Prostate Specific Ag, Serum: 0.3 ng/mL (ref 0.0–4.0)

## 2019-06-23 NOTE — Progress Notes (Signed)
06/24/2019 11:44 AM   Tom Underwood 11-28-1951 [id number]  Referring provider: Alba CorySowles, Krichna, MD [address] [place] [id number]  Chief Complaint  Patient presents with  . Follow-up    1 year PSA exam    HPI: 68 yo WM with a history of hematuria, nephrolithiasis and BPH with LU TS who presents today for a one year follow up.  He has a small rash that has popped up recently.  It has been present for a few days.  It does not itch.    History of hematuria Former smoker.  He completed a hematuria work up with a CT Urogram and cystoscopy in 2017 - no worrisome findings.  Follow up UA's negative x 3.  No reports of gross hematuria.    Nephrolithiasis 1 mm stone in left kidney seen on 2017 CT Urogram.  Patient has a prior history of nephrolithiasis. In 2014, he spontaneously passed a left UPJ stone. The stone composition is unknown.  He has not had any flank pain, since his last visit with us.   BPH  His IPSS score today is 1, which is mild lower urinary tract symptomatology.  He is pleased with his quality life due to his urinary symptoms. His previous IPSS score was 3/0.  His has no complaints at this visit.  He is drinking 2 gallons of water daily.  He denies any dysuria, hematuria or suprapubic pain.   He also denies any recent fevers, chills, nausea or vomiting.   He does not have a family history of PCa. IPSS    Row Name 06/24/19 1100         International Prostate Symptom Score   How often have you had the sensation of not emptying your bladder?  Not at All     How often have you had to urinate less than every two hours?  Not at All     How often have you found you stopped and started again several times when you urinated?  Not at All     How often have you found it difficult to postpone urination?  Less than 1 in 5 times     How often have you had a weak urinary stream?  Not at All     How often have you had to strain to start urination?  Not at  All     How many times did you typically get up at night to urinate?  None     Total IPSS Score  1       Quality of Life due to urinary symptoms   If you were to spend the rest of your life with your urinary condition just the way it is now how would you feel about that?  Pleased        Score:  1-7 Mild 8-19 Moderate 20-35 Severe  Erectile dysfunction His SHIM score is 5, which is severe ED.   He has been having difficulty with erections for several years.   His risk factors for ED are age, BPH, HTN, HLD and sleep apnea.   He denies any painful erections or curvatures with his erections.   He is no longer having spontaneous erections.  He has not tried the sildenafil. SHIM    Row Name 06/24/19 1116         SHIM: Over the last 6 months:   How do you rate your confidence that you could get and keep an erection?  Very Low     When you had erections with sexual stimulation, how often were your erections hard enough for penetration (entering your partner)?  Almost Never or Never     During sexual intercourse, how often were you able to maintain your erection after you had penetrated (entered) your partner?  Almost Never or Never     During sexual intercourse, how difficult was it to maintain your erection to completion of intercourse?  Extremely Difficult     When you attempted sexual intercourse, how often was it satisfactory for you?  Almost Never or Never       SHIM Total Score   SHIM  5        Score: 1-7 Severe ED 8-11 Moderate ED 12-16 Mild-Moderate ED 17-21 Mild ED 22-25 No ED     PMH: Past Medical History:  Diagnosis Date  . Decreased libido   . Diverticulitis   . HLD (hyperlipidemia)   . Hydronephrosis with renal and ureteral calculus obstruction   . Hydronephrosis with renal and ureteral calculus obstruction   . Hypogonadism in male   . Rhinitis, allergic   . Sleep apnea   . Ureteral stone     Surgical History: Past Surgical History:  Procedure  Laterality Date  . EXTERNAL EAR SURGERY    . FINGER SURGERY    . KNEE SURGERY    . left eye      Home Medications:  Allergies as of 06/24/2019      Reactions   Penicillins Itching      Medication List       Accurate as of June 24, 2019 11:44 AM. If you have any questions, ask your nurse or doctor.        acetaminophen 650 MG CR tablet Commonly known as: TYLENOL Take 650 mg by mouth every 8 (eight) hours as needed for pain.   amitriptyline 25 MG tablet Commonly known as: ELAVIL TAKE 1 TABLET BY MOUTH AT BEDTIME AS NEEDED FOR SLEEP   aspirin EC 81 MG tablet Take 1 tablet (81 mg total) by mouth daily.   meloxicam 15 MG tablet Commonly known as: MOBIC Take 1 tablet by mouth once daily   multivitamin tablet Take 1 tablet by mouth daily.   Probiotic 250 MG Caps Take 1 capsule by mouth daily.   rosuvastatin 5 MG tablet Commonly known as: CRESTOR Take 1 tablet (5 mg total) by mouth at bedtime.   sildenafil 20 MG tablet Commonly known as: REVATIO Take 3 to 5 tablets two hours before intercouse on an empty stomach.  Do not take with nitrates.   valsartan 80 MG tablet Commonly known as: Diovan Take 1 tablet (80 mg total) by mouth daily.       Allergies:  Allergies  Allergen Reactions  . Penicillins Itching    Family History: Family History  Problem Relation Age of Onset  . Asthma Mother   . Heart disease Mother   . Heart attack Mother   . Dementia Mother   . Asthma Father   . Heart disease Father   . Stroke Father     Social History:  reports that he quit smoking about 45 years ago. His smoking use included pipe. He started smoking about 46 years ago. He has never used smokeless tobacco. He reports that he does not drink alcohol or use drugs.  ROS: UROLOGY Frequent Urination?: No Hard to postpone urination?: No Burning/pain with urination?: No Get up at night to urinate?: No Leakage  of urine?: No Urine stream starts and stops?: No Trouble  starting stream?: No Do you have to strain to urinate?: No Blood in urine?: No Urinary tract infection?: No Sexually transmitted disease?: No Injury to kidneys or bladder?: No Painful intercourse?: No Weak stream?: No Erection problems?: Yes Penile pain?: No  Gastrointestinal Nausea?: No Vomiting?: No Indigestion/heartburn?: No Diarrhea?: No Constipation?: No  Constitutional Fever: No Night sweats?: Yes Weight loss?: No Fatigue?: No  Skin Skin rash/lesions?: No Itching?: No  Eyes Blurred vision?: No Double vision?: No  Ears/Nose/Throat Sore throat?: No Sinus problems?: No  Hematologic/Lymphatic Swollen glands?: No Easy bruising?: No  Cardiovascular Leg swelling?: Yes Chest pain?: No  Respiratory Cough?: No Shortness of breath?: No  Endocrine Excessive thirst?: No  Musculoskeletal Back pain?: No Joint pain?: No  Neurological Headaches?: No Dizziness?: No  Psychologic Depression?: No Anxiety?: No  Physical Exam: BP (!) 149/75   Pulse 76   Temp (!) 97.5 F (36.4 C) (Oral)   Resp 18   Ht 5\' 11"  (1.803 m)   Wt 296 lb (134.3 kg)   SpO2 99%   BMI 41.28 kg/m   Constitutional:  Well nourished. Alert and oriented, No acute distress. HEENT: [place] AT, moist mucus membranes.  Trachea midline, no masses. Cardiovascular: No clubbing, cyanosis, or edema. Respiratory: Normal respiratory effort, no increased work of breathing. GI: Abdomen is soft, non tender, non distended, no abdominal masses. Liver and spleen not palpable.  No hernias appreciated.  Stool sample for occult testing is not indicated.   GU: No CVA tenderness.  No bladder fullness or masses.  Patient with circumcised phallus.  Urethral meatus is patent.  No penile discharge. No penile lesions or rashes. Scrotum without lesions, cysts, rashes and/or edema.  Testicles are located scrotally bilaterally. No masses are appreciated in the testicles. Left and right epididymis are normal. Rectal:  Patient with  normal sphincter tone. Anus and perineum without scarring or rashes. No rectal masses are appreciated. Prostate is approximately 55 grams, could only palpate apex and midportion of the gland,  no nodules are appreciated.  Skin: Very small 5 mm x 7 mm annular rash on the right lower back leg Lymph: No cervical or inguinal adenopathy. Neurologic: Grossly intact, no focal deficits, moving all 4 extremities. Psychiatric: Normal mood and affect.   Laboratory Data:  Lab Results  Component Value Date   CREATININE 0.89 10/05/2018    Lab Results  Component Value Date   TSH 2.400 08/07/2015       Component Value Date/Time   CHOL 144 10/05/2018 1306   HDL 57 10/05/2018 1306   CHOLHDL 2.5 10/05/2018 1306   LDLCALC 75 10/05/2018 1306    Lab Results  Component Value Date   AST 21 10/05/2018   Lab Results  Component Value Date   ALT 25 10/05/2018   PSA History  0.3 ng/mL on 01/22/2016  0.3 ng/mL on 02/2018  0.3 ng/mL on 06/20/2019 I have reviewed the labs.  Assessment & Plan:    1. BPH  I PSS score is 1/1, it is improved Continue conservative management, avoiding bladder irritants and timed voiding's RTC in 12 months for IPSS, PSA and exam   2. ED Has not tried the sildenafil at this time  3. History of hematuria Completed hematuria work up in 2017 - no worrisome findings No recent gross hematuria UA was negative - no further screening needed as patient has had three negative fro [facility] UA's   4. Nephrolithiasis 1 mm stone  in left kidney No intervention at this time  5. Rash Patient will keep an eye on the rash and will notify us of any changes  Return in about 1 year (around 06/23/2020) for IPSS, SHIM, PSA and exam.  These notes generated with voice recognition software. I apologize for typographical errors.  Michiel CowboySHANNON Aishah Teffeteller, PA-C  [facility] [address] [place], [place] [address] [phone]

## 2019-06-24 ENCOUNTER — Ambulatory Visit (INDEPENDENT_AMBULATORY_CARE_PROVIDER_SITE_OTHER): Payer: Medicare Other | Admitting: Urology

## 2019-06-24 ENCOUNTER — Encounter: Payer: Self-pay | Admitting: Urology

## 2019-06-24 ENCOUNTER — Other Ambulatory Visit: Payer: Self-pay

## 2019-06-24 VITALS — BP 149/75 | HR 76 | Temp 97.5°F | Resp 18 | Ht 71.0 in | Wt 296.0 lb

## 2019-06-24 DIAGNOSIS — N529 Male erectile dysfunction, unspecified: Secondary | ICD-10-CM

## 2019-06-24 DIAGNOSIS — N200 Calculus of kidney: Secondary | ICD-10-CM

## 2019-06-24 DIAGNOSIS — Z87448 Personal history of other diseases of urinary system: Secondary | ICD-10-CM

## 2019-06-24 DIAGNOSIS — R21 Rash and other nonspecific skin eruption: Secondary | ICD-10-CM

## 2019-06-24 DIAGNOSIS — N400 Enlarged prostate without lower urinary tract symptoms: Secondary | ICD-10-CM

## 2019-06-25 ENCOUNTER — Other Ambulatory Visit: Payer: Self-pay | Admitting: Urology

## 2019-06-25 NOTE — Telephone Encounter (Signed)
Pt called asking for refill of "Xadalifil the little blue pills" 50mg  old Rx from 2 yrs ago. Please send to [facility] on [address] please. Thanks.

## 2019-06-26 NOTE — Telephone Encounter (Signed)
Is Mr. Yohn referring to sildenafil or tadalafil?

## 2019-06-26 NOTE — Telephone Encounter (Signed)
Yes.  It is okay to refill the sildenafil, but it comes in a 20 mg dose.  Sildenafil 20 mg, 3 to 5 tablets two hours prior to intercourse on an empty stomach, # 50; he is warned not to take medications that contain nitrates.  I also advised him of the side effects, such as: headache, flushing, dyspepsia, abnormal vision, nasal congestion, back pain, myalgia, nausea, dizziness, and rash.

## 2019-06-26 NOTE — Telephone Encounter (Signed)
It is Sildenafil. I informed him that the RX is filled a little differently now at 10mg  take 2-5 tablets as needed. Patient voiced understanding. Ok to fill?

## 2019-06-27 MED ORDER — SILDENAFIL CITRATE 20 MG PO TABS: ORAL_TABLET | ORAL | 3 refills | Status: DC

## 2019-06-27 NOTE — Telephone Encounter (Signed)
Refill sent and mychart reminder sent

## 2019-07-02 ENCOUNTER — Ambulatory Visit (INDEPENDENT_AMBULATORY_CARE_PROVIDER_SITE_OTHER): Payer: Medicare Other | Admitting: Family Medicine

## 2019-07-02 ENCOUNTER — Other Ambulatory Visit: Payer: Self-pay

## 2019-07-02 ENCOUNTER — Encounter: Payer: Self-pay | Admitting: Family Medicine

## 2019-07-02 VITALS — BP 130/70 | HR 80 | Temp 97.3°F | Resp 16 | Ht 71.0 in | Wt 296.1 lb

## 2019-07-02 DIAGNOSIS — R251 Tremor, unspecified: Secondary | ICD-10-CM

## 2019-07-02 DIAGNOSIS — M25541 Pain in joints of right hand: Secondary | ICD-10-CM

## 2019-07-02 DIAGNOSIS — N528 Other male erectile dysfunction: Secondary | ICD-10-CM

## 2019-07-02 DIAGNOSIS — E785 Hyperlipidemia, unspecified: Secondary | ICD-10-CM

## 2019-07-02 DIAGNOSIS — R739 Hyperglycemia, unspecified: Secondary | ICD-10-CM

## 2019-07-02 DIAGNOSIS — G4733 Obstructive sleep apnea (adult) (pediatric): Secondary | ICD-10-CM

## 2019-07-02 DIAGNOSIS — I10 Essential (primary) hypertension: Secondary | ICD-10-CM | POA: Diagnosis not present

## 2019-07-02 DIAGNOSIS — G4700 Insomnia, unspecified: Secondary | ICD-10-CM | POA: Diagnosis not present

## 2019-07-02 DIAGNOSIS — M170 Bilateral primary osteoarthritis of knee: Secondary | ICD-10-CM

## 2019-07-02 DIAGNOSIS — M25542 Pain in joints of left hand: Secondary | ICD-10-CM | POA: Diagnosis not present

## 2019-07-02 MED ORDER — AMITRIPTYLINE HCL 25 MG PO TABS: 25.0000 mg | ORAL_TABLET | Freq: Every evening | ORAL | 1 refills | Status: DC | PRN

## 2019-07-02 MED ORDER — VASCEPA 1 G PO CAPS: 2.0000 | ORAL_CAPSULE | Freq: Two times a day (BID) | ORAL | 1 refills | Status: DC

## 2019-07-02 MED ORDER — VALSARTAN 80 MG PO TABS: 80.0000 mg | ORAL_TABLET | Freq: Every day | ORAL | 1 refills | Status: DC

## 2019-07-02 NOTE — Progress Notes (Signed)
Name: Tom GoldsmithClaude D Underwood   MRN: [id number]    DOB: [birth year]   Date:07/02/2019       Progress Note  Subjective  Chief Complaint  Chief Complaint  Patient presents with  . Sleep Apnea  . Benign Prostatic Hypertrophy  . Hyperlipidemia  . Tremors    Bilateral hand tremors are gradually worsening.  . Joint Swelling    Ankles continue to swell. He has started back wearing his compression sock and using ice compression at night help with the swelling.    HPI  HTN: he is now on Diovan 80 mg daily , no chest pain, palpitation or dizziness.   OSA: he has not been compliant with machine, he does not want a repeat study at this time, he states wife is not complaining of his snoring as much   Hyperlipidemia: he is taking Crestor and currently on Lovaza but states his new insurance does not cover it, he states needs to be switched to Vascepa   BPH: under the care of Michiel CowboyShannon McGowan for his urological care. Unchanged   Morbid obesity / pre diabetes: he is not on medication, he has gained 15 lbs since he retired back in May and not moving as much because of COVID-19. He denies polyphagia, polydipsia or polyuria.   Arthralgia and stiffness hands: he states he has noticed worsening joint pain, stiffness for over one year, having trigger fingers. Checked multiple labs in 2019 and negative for inflammatory arthritis. He also has pain on knees and ankles.    Tremors: hands , going on for months, seems to be present when at rest or with movement, coarse, no weakness, we will refer him to Neurologist  Patient Active Problem List   Diagnosis Date Noted  . History of kidney stones 05/02/2018  . ED (erectile dysfunction) 05/02/2018  . Hyperglycemia 01/30/2017  . Arthritis of knee, degenerative 01/30/2017  . BPH with obstruction/lower urinary tract symptoms 02/03/2016  . Gross hematuria 02/03/2016  . Dyslipidemia 08/04/2015  . Skin lesions 07/14/2015  . Diverticulitis of large intestine without  perforation or abscess without bleeding 06/30/2015  . Edema 06/30/2015  . OSA on CPAP 06/30/2015  . Kidney pain 06/30/2015    Past Surgical History:  Procedure Laterality Date  . EXTERNAL EAR SURGERY    . FINGER SURGERY    . KNEE SURGERY    . left eye      Family History  Problem Relation Age of Onset  . Asthma Mother   . Heart disease Mother   . Heart attack Mother   . Dementia Mother   . Asthma Father   . Heart disease Father   . Stroke Father     Social History   Socioeconomic History  . Marital status: Married    Spouse name: Lupita LeashDonna   . Number of children: 2  . Years of education: Not on file  . Highest education level: Not on file  Occupational History  . Occupation: retired   Engineer, productionocial Needs  . Financial resource strain: Not hard at all  . Food insecurity    Worry: Never true    Inability: Never true  . Transportation needs    Medical: No    Non-medical: No  Tobacco Use  . Smoking status: Former Smoker    Types: Pipe    Start date: 02/20/1973    Quit date: 02/20/1974    Years since quitting: 45.3  . Smokeless tobacco: Never Used  Substance and Sexual Activity  . Alcohol use:  No    Alcohol/week: 0.0 standard drinks  . Drug use: No  . Sexual activity: Yes    Partners: Female  Lifestyle  . Physical activity    Days per week: 0 days    Minutes per session: 0 min  . Stress: Not at all  Relationships  . Social connections    Talks on phone: More than three times a week    Gets together: Twice a week    Attends religious service: More than 4 times per year    Active member of club or organization: Yes    Attends meetings of clubs or organizations: More than 4 times per year    Relationship status: Married  . Intimate partner violence    Fear of current or ex partner: No    Emotionally abused: No    Physically abused: No    Forced sexual activity: No  Other Topics Concern  . Not on file  Social History Narrative  . Not on file     Current  Outpatient Medications:  .  acetaminophen (TYLENOL) 650 MG CR tablet, Take 650 mg by mouth every 8 (eight) hours as needed for pain., Disp: , Rfl:  .  amitriptyline (ELAVIL) 25 MG tablet, Take 1 tablet (25 mg total) by mouth at bedtime as needed. for sleep, Disp: 90 tablet, Rfl: 1 .  aspirin EC 81 MG tablet, Take 1 tablet (81 mg total) by mouth daily., Disp: 30 tablet, Rfl: 0 .  meloxicam (MOBIC) 15 MG tablet, Take 1 tablet by mouth once daily, Disp: 30 tablet, Rfl: 0 .  Multiple Vitamin (MULTIVITAMIN) tablet, Take 1 tablet by mouth daily., Disp: , Rfl:  .  rosuvastatin (CRESTOR) 5 MG tablet, Take 1 tablet (5 mg total) by mouth at bedtime., Disp: 90 tablet, Rfl: 1 .  Saccharomyces boulardii (PROBIOTIC) 250 MG CAPS, Take 1 capsule by mouth daily., Disp: 30 capsule, Rfl: 0 .  sildenafil (REVATIO) 20 MG tablet, Take 3 to 5 tablets two hours before intercouse on an empty stomach.  Do not take with nitrates., Disp: 50 tablet, Rfl: 3 .  valsartan (DIOVAN) 80 MG tablet, Take 1 tablet (80 mg total) by mouth daily., Disp: 90 tablet, Rfl: 1  Allergies  Allergen Reactions  . Penicillins Itching    I personally reviewed active problem list, medication list, allergies, family history, social history with the patient/caregiver today.   ROS  Constitutional: Negative for fever, positive for 15 lbs  weight change.  Respiratory: Negative for cough and shortness of breath.   Cardiovascular: Negative for chest pain or palpitations.  Gastrointestinal: Negative for abdominal pain, no bowel changes.  Musculoskeletal: Positive  for some gait problem and  joint swelling.  Skin: Negative for rash.  Neurological: Negative for dizziness or headache.  No other specific complaints in a complete review of systems (except as listed in HPI above).  Objective  Vitals:   07/02/19 0823  BP: 130/70  Pulse: 80  Resp: 16  Temp: (!) 97.3 F (36.3 C)  TempSrc: Temporal  SpO2: 98%  Weight: 296 lb 1.6 oz (134.3 kg)   Height: 5\' 11"  (1.803 m)    Body mass index is 41.3 kg/m.  Physical Exam  Constitutional: Patient appears well-developed and well-nourished. Obese  No distress.  HEENT: head atraumatic, normocephalic, pupils equal and reactive to light,  neck supple Cardiovascular: Normal rate, regular rhythm and normal heart sounds.  No murmur heard. No BLE edema. Pulmonary/Chest: Effort normal and breath sounds normal. No respiratory  distress. Abdominal: Soft.  There is no tenderness. Muscular Skeletal: decrease rom of some of fingers, some enlargement of DIP, crepitus with extension of both knees Neurologist: tremors, coarse at rest  Psychiatric: Patient has a normal mood and affect. behavior is normal. Judgment and thought content normal.  Recent Results (from the past 2160 hour(s))  PSA     Status: None   Collection Time: 06/20/19  2:04 PM  Result Value Ref Range   Prostate Specific Ag, Serum 0.3 0.0 - 4.0 ng/mL    Comment: Roche ECLIA methodology. According to the American Urological Association, Serum PSA should decrease and remain at undetectable levels after radical prostatectomy. The AUA defines biochemical recurrence as an initial PSA value 0.2 ng/mL or greater followed by a subsequent confirmatory PSA value 0.2 ng/mL or greater. Values obtained with different assay methods or kits cannot be used interchangeably. Results cannot be interpreted as absolute evidence of the presence or absence of malignant disease.       PHQ2/9: Depression screen [facility] 2/9 07/02/2019 02/21/2019 10/19/2018 10/05/2018 05/02/2018  Decreased Interest 0 0 0 1 0  Down, Depressed, Hopeless 0 0 0 1 0  PHQ - 2 Score 0 0 0 2 0  Altered sleeping 0 0 - 0 -  Tired, decreased energy 0 0 - 0 -  Change in appetite 0 0 - 1 -  Feeling bad or failure about yourself  0 0 - 1 -  Trouble concentrating 0 0 - 0 -  Moving slowly or fidgety/restless 0 0 - 1 -  Suicidal thoughts 0 0 - 0 -  PHQ-9 Score 0 0 - 5 -  Difficult  doing work/chores - - - Not difficult at all -    phq 9 is negative   Fall Risk: Fall Risk  06/24/2019 02/21/2019 10/19/2018 10/05/2018 05/02/2018  Falls in the past year? 0 0 0 0 No  Number falls in past yr: - 0 - 0 -  Injury with Fall? - 0 - 0 -    Functional Status Survey: Is the patient deaf or have difficulty hearing?: No Does the patient have difficulty seeing, even when wearing glasses/contacts?: No Does the patient have difficulty concentrating, remembering, or making decisions?: No Does the patient have difficulty walking or climbing stairs?: No Does the patient have difficulty dressing or bathing?: No Does the patient have difficulty doing errands alone such as visiting a doctor's office or shopping?: No    Assessment & Plan  1. Occasional tremors  - Ambulatory referral to Neurology  2. Dyslipidemia   3. Hyperglycemia  Last A1C was at goal  4. Morbid obesity ([place])  Discussed with the patient the risk posed by an increased BMI. Discussed importance of portion control, calorie counting and at least 150 minutes of physical activity weekly. Avoid sweet beverages and drink more water. Eat at least 6 servings of fruit and vegetables daily   5. Hypertension, benign   - valsartan (DIOVAN) 80 MG tablet; Take 1 tablet (80 mg total) by mouth daily.  Dispense: 90 tablet; Refill: 1  6. OSA (obstructive sleep apnea)   7. Arthralgia of hands, bilateral  Used to be a boxer  99. Primary osteoarthritis of both knees  stable  9. Other male erectile dysfunction   10. Insomnia, unspecified type  - amitriptyline (ELAVIL) 25 MG tablet; Take 1 tablet (25 mg total) by mouth at bedtime as needed. for sleep  Dispense: 90 tablet; Refill: 1

## 2019-07-04 ENCOUNTER — Encounter: Payer: Self-pay | Admitting: Family Medicine

## 2019-07-05 ENCOUNTER — Other Ambulatory Visit: Payer: Self-pay | Admitting: Family Medicine

## 2019-07-05 ENCOUNTER — Telehealth: Payer: Self-pay

## 2019-07-05 DIAGNOSIS — E785 Hyperlipidemia, unspecified: Secondary | ICD-10-CM

## 2019-07-05 MED ORDER — VASCEPA 1 G PO CAPS: 2.0000 | ORAL_CAPSULE | Freq: Two times a day (BID) | ORAL | 2 refills | Status: DC

## 2019-07-05 MED ORDER — SILDENAFIL CITRATE 50 MG PO TABS: 50.0000 mg | ORAL_TABLET | Freq: Every day | ORAL | 0 refills | Status: DC | PRN

## 2019-07-05 NOTE — Telephone Encounter (Signed)
My chart sent from patient: can you call my sildenifil 20mg  script into [facility] on [address] [phone]) maybe get it changed to 50mg ? I can get 20mg   thru goodrx for less than $13 an50mg  for less than $18.....thats for 30 pills.20mg  over $600 at [facility] for 30 and $147 for 10 pills, 20mg .  thanks for your help..Tom Underwood

## 2019-07-05 NOTE — Telephone Encounter (Signed)
Script sent  

## 2019-07-05 NOTE — Telephone Encounter (Signed)
It is fine to change the dose.   He can take up to 5 tablets at one time of the 20 mg.

## 2019-07-08 ENCOUNTER — Other Ambulatory Visit: Payer: Self-pay | Admitting: Family Medicine

## 2019-07-08 ENCOUNTER — Other Ambulatory Visit: Payer: Self-pay

## 2019-07-08 DIAGNOSIS — E785 Hyperlipidemia, unspecified: Secondary | ICD-10-CM

## 2019-07-08 MED ORDER — OMEGA-3-ACID ETHYL ESTERS 1 G PO CAPS: 1.0000 g | ORAL_CAPSULE | Freq: Two times a day (BID) | ORAL | 1 refills | Status: DC

## 2019-07-08 MED ORDER — OMEGA-3-ACID ETHYL ESTERS 1 G PO CAPS: 2.0000 g | ORAL_CAPSULE | Freq: Two times a day (BID) | ORAL | 5 refills | Status: DC

## 2019-07-08 NOTE — Telephone Encounter (Signed)
please call [facility] and give them a prescription for( omega-3-acid ethyl esters capsules usp) 120ct with instructions to take 2 a day twice a day..was there saturday and they had a script for vascepa and it cost way to much even w/good rx...cant afford vascepa because im on medicare but CAN afford the OMEGA-3...Marland KitchenMarland KitchenTHANK YOU FOR YOUR TIME AND CONSIDERATION...Deondra

## 2019-07-23 ENCOUNTER — Ambulatory Visit: Payer: Self-pay | Admitting: *Deleted

## 2019-07-23 DIAGNOSIS — R402143 Coma scale, eyes open, spontaneous, at hospital admission: Secondary | ICD-10-CM | POA: Diagnosis present

## 2019-07-23 DIAGNOSIS — Z79899 Other long term (current) drug therapy: Secondary | ICD-10-CM | POA: Diagnosis not present

## 2019-07-23 DIAGNOSIS — I119 Hypertensive heart disease without heart failure: Secondary | ICD-10-CM | POA: Diagnosis present

## 2019-07-23 DIAGNOSIS — R278 Other lack of coordination: Secondary | ICD-10-CM | POA: Diagnosis not present

## 2019-07-23 DIAGNOSIS — Z6841 Body Mass Index (BMI) 40.0 and over, adult: Secondary | ICD-10-CM | POA: Diagnosis not present

## 2019-07-23 DIAGNOSIS — Z87891 Personal history of nicotine dependence: Secondary | ICD-10-CM | POA: Diagnosis not present

## 2019-07-23 DIAGNOSIS — Z20828 Contact with and (suspected) exposure to other viral communicable diseases: Secondary | ICD-10-CM | POA: Diagnosis not present

## 2019-07-23 DIAGNOSIS — I517 Cardiomegaly: Secondary | ICD-10-CM | POA: Diagnosis not present

## 2019-07-23 DIAGNOSIS — M170 Bilateral primary osteoarthritis of knee: Secondary | ICD-10-CM | POA: Diagnosis present

## 2019-07-23 DIAGNOSIS — Z9119 Patient's noncompliance with other medical treatment and regimen: Secondary | ICD-10-CM | POA: Diagnosis not present

## 2019-07-23 DIAGNOSIS — R29701 NIHSS score 1: Secondary | ICD-10-CM | POA: Diagnosis not present

## 2019-07-23 DIAGNOSIS — G4733 Obstructive sleep apnea (adult) (pediatric): Secondary | ICD-10-CM | POA: Diagnosis present

## 2019-07-23 DIAGNOSIS — R402363 Coma scale, best motor response, obeys commands, at hospital admission: Secondary | ICD-10-CM | POA: Diagnosis present

## 2019-07-23 DIAGNOSIS — R402253 Coma scale, best verbal response, oriented, at hospital admission: Secondary | ICD-10-CM | POA: Diagnosis present

## 2019-07-23 DIAGNOSIS — R29810 Facial weakness: Secondary | ICD-10-CM | POA: Diagnosis not present

## 2019-07-23 DIAGNOSIS — I6782 Cerebral ischemia: Secondary | ICD-10-CM | POA: Diagnosis not present

## 2019-07-23 DIAGNOSIS — L740 Miliaria rubra: Secondary | ICD-10-CM | POA: Diagnosis not present

## 2019-07-23 DIAGNOSIS — Z7982 Long term (current) use of aspirin: Secondary | ICD-10-CM | POA: Diagnosis not present

## 2019-07-23 DIAGNOSIS — E785 Hyperlipidemia, unspecified: Secondary | ICD-10-CM | POA: Diagnosis present

## 2019-07-23 DIAGNOSIS — I639 Cerebral infarction, unspecified: Secondary | ICD-10-CM

## 2019-07-23 DIAGNOSIS — R251 Tremor, unspecified: Secondary | ICD-10-CM | POA: Diagnosis present

## 2019-07-23 DIAGNOSIS — I6381 Other cerebral infarction due to occlusion or stenosis of small artery: Secondary | ICD-10-CM | POA: Diagnosis not present

## 2019-07-23 DIAGNOSIS — G8191 Hemiplegia, unspecified affecting right dominant side: Secondary | ICD-10-CM | POA: Diagnosis not present

## 2019-07-23 DIAGNOSIS — R2681 Unsteadiness on feet: Secondary | ICD-10-CM | POA: Diagnosis not present

## 2019-07-23 DIAGNOSIS — R531 Weakness: Secondary | ICD-10-CM | POA: Diagnosis not present

## 2019-07-23 DIAGNOSIS — R29818 Other symptoms and signs involving the nervous system: Secondary | ICD-10-CM | POA: Diagnosis not present

## 2019-07-23 DIAGNOSIS — I10 Essential (primary) hypertension: Secondary | ICD-10-CM | POA: Diagnosis not present

## 2019-07-23 DIAGNOSIS — Z880 Allergy status to penicillin: Secondary | ICD-10-CM | POA: Diagnosis not present

## 2019-07-23 DIAGNOSIS — I69320 Aphasia following cerebral infarction: Secondary | ICD-10-CM | POA: Diagnosis not present

## 2019-07-23 HISTORY — DX: Cerebral infarction, unspecified: I63.9

## 2019-07-23 NOTE — Telephone Encounter (Signed)
Pt called with complaints of heaviness in his right arm and leg which started 07/22/2019 around 2230; when he woke up at 0430 07/23/2019, and he was almost dragging it; he says that he had the same sensation in the right arm; he is also having right arm and leg weakness; recommendations made per nurse triage protocol; the pt and his wife are not sure if they are going to call 911 or go to hospital via POV; he sees Dr Ancil Boozer, [name]; will route to office for notification.  Reason for Disposition . [1] Weakness (i.e., paralysis, loss of muscle strength) of the face, arm / hand, or leg / foot on one side of the body AND [2] sudden onset AND [3] present now  Answer Assessment - Initial Assessment Questions 1. SYMPTOM: "What is the main symptom you are concerned about?" (e.g., weakness, numbness)     Heaviness in right leg and arm 2. ONSET: "When did this start?" (minutes, hours, days; while sleeping)    07/22/2019 3. LAST NORMAL: "When was the last time you were normal (no symptoms)?"    2230 4. PATTERN "Does this come and go, or has it been constant since it started?"  "Is it present now?"    constant 5. CARDIAC SYMPTOMS: "Have you had any of the following symptoms: chest pain, difficulty breathing, palpitations?"   no 6. NEUROLOGIC SYMPTOMS: "Have you had any of the following symptoms: headache, dizziness, vision loss, double vision, changes in speech, unsteady on your feet?"     Having to drag right leg; weakness in right arm and leg 7. OTHER SYMPTOMS: "Do you have any other symptoms?"     no 8. PREGNANCY: "Is there any chance you are pregnant?" "When was your last menstrual period?"    n/a  Protocols used: NEUROLOGIC DEFICIT-A-AH

## 2019-07-23 NOTE — Telephone Encounter (Signed)
Spoke with pt's wife, Butch Penny; she states that EMS is there; will route to office for notification.

## 2019-07-29 DIAGNOSIS — I639 Cerebral infarction, unspecified: Secondary | ICD-10-CM | POA: Diagnosis not present

## 2019-07-31 DIAGNOSIS — E785 Hyperlipidemia, unspecified: Secondary | ICD-10-CM | POA: Diagnosis not present

## 2019-07-31 DIAGNOSIS — Z6841 Body Mass Index (BMI) 40.0 and over, adult: Secondary | ICD-10-CM | POA: Diagnosis not present

## 2019-07-31 DIAGNOSIS — I10 Essential (primary) hypertension: Secondary | ICD-10-CM | POA: Diagnosis not present

## 2019-07-31 DIAGNOSIS — G4733 Obstructive sleep apnea (adult) (pediatric): Secondary | ICD-10-CM | POA: Diagnosis not present

## 2019-07-31 DIAGNOSIS — I69351 Hemiplegia and hemiparesis following cerebral infarction affecting right dominant side: Secondary | ICD-10-CM | POA: Diagnosis not present

## 2019-07-31 DIAGNOSIS — R251 Tremor, unspecified: Secondary | ICD-10-CM | POA: Diagnosis not present

## 2019-07-31 DIAGNOSIS — M1710 Unilateral primary osteoarthritis, unspecified knee: Secondary | ICD-10-CM | POA: Diagnosis not present

## 2019-08-01 DIAGNOSIS — G4733 Obstructive sleep apnea (adult) (pediatric): Secondary | ICD-10-CM | POA: Diagnosis not present

## 2019-08-01 DIAGNOSIS — M1710 Unilateral primary osteoarthritis, unspecified knee: Secondary | ICD-10-CM | POA: Diagnosis not present

## 2019-08-01 DIAGNOSIS — E785 Hyperlipidemia, unspecified: Secondary | ICD-10-CM | POA: Diagnosis not present

## 2019-08-01 DIAGNOSIS — I10 Essential (primary) hypertension: Secondary | ICD-10-CM | POA: Diagnosis not present

## 2019-08-01 DIAGNOSIS — I69351 Hemiplegia and hemiparesis following cerebral infarction affecting right dominant side: Secondary | ICD-10-CM | POA: Diagnosis not present

## 2019-08-01 DIAGNOSIS — R251 Tremor, unspecified: Secondary | ICD-10-CM | POA: Diagnosis not present

## 2019-08-02 DIAGNOSIS — E785 Hyperlipidemia, unspecified: Secondary | ICD-10-CM | POA: Diagnosis not present

## 2019-08-02 DIAGNOSIS — I10 Essential (primary) hypertension: Secondary | ICD-10-CM | POA: Diagnosis not present

## 2019-08-02 DIAGNOSIS — R251 Tremor, unspecified: Secondary | ICD-10-CM | POA: Diagnosis not present

## 2019-08-02 DIAGNOSIS — M1710 Unilateral primary osteoarthritis, unspecified knee: Secondary | ICD-10-CM | POA: Diagnosis not present

## 2019-08-02 DIAGNOSIS — I69351 Hemiplegia and hemiparesis following cerebral infarction affecting right dominant side: Secondary | ICD-10-CM | POA: Diagnosis not present

## 2019-08-02 DIAGNOSIS — G4733 Obstructive sleep apnea (adult) (pediatric): Secondary | ICD-10-CM | POA: Diagnosis not present

## 2019-08-02 DIAGNOSIS — G4739 Other sleep apnea: Secondary | ICD-10-CM | POA: Diagnosis not present

## 2019-08-05 DIAGNOSIS — G4733 Obstructive sleep apnea (adult) (pediatric): Secondary | ICD-10-CM | POA: Diagnosis not present

## 2019-08-05 DIAGNOSIS — I10 Essential (primary) hypertension: Secondary | ICD-10-CM | POA: Diagnosis not present

## 2019-08-05 DIAGNOSIS — I69351 Hemiplegia and hemiparesis following cerebral infarction affecting right dominant side: Secondary | ICD-10-CM | POA: Diagnosis not present

## 2019-08-05 DIAGNOSIS — E785 Hyperlipidemia, unspecified: Secondary | ICD-10-CM | POA: Diagnosis not present

## 2019-08-05 DIAGNOSIS — R251 Tremor, unspecified: Secondary | ICD-10-CM | POA: Diagnosis not present

## 2019-08-05 DIAGNOSIS — M1710 Unilateral primary osteoarthritis, unspecified knee: Secondary | ICD-10-CM | POA: Diagnosis not present

## 2019-08-06 DIAGNOSIS — G4739 Other sleep apnea: Secondary | ICD-10-CM | POA: Insufficient documentation

## 2019-08-07 DIAGNOSIS — I10 Essential (primary) hypertension: Secondary | ICD-10-CM | POA: Diagnosis not present

## 2019-08-07 DIAGNOSIS — M1710 Unilateral primary osteoarthritis, unspecified knee: Secondary | ICD-10-CM | POA: Diagnosis not present

## 2019-08-07 DIAGNOSIS — R251 Tremor, unspecified: Secondary | ICD-10-CM | POA: Diagnosis not present

## 2019-08-07 DIAGNOSIS — G4733 Obstructive sleep apnea (adult) (pediatric): Secondary | ICD-10-CM | POA: Diagnosis not present

## 2019-08-07 DIAGNOSIS — E785 Hyperlipidemia, unspecified: Secondary | ICD-10-CM | POA: Diagnosis not present

## 2019-08-07 DIAGNOSIS — I69351 Hemiplegia and hemiparesis following cerebral infarction affecting right dominant side: Secondary | ICD-10-CM | POA: Diagnosis not present

## 2019-08-13 DIAGNOSIS — I69351 Hemiplegia and hemiparesis following cerebral infarction affecting right dominant side: Secondary | ICD-10-CM | POA: Insufficient documentation

## 2019-08-13 DIAGNOSIS — Z8673 Personal history of transient ischemic attack (TIA), and cerebral infarction without residual deficits: Secondary | ICD-10-CM | POA: Diagnosis not present

## 2019-08-13 DIAGNOSIS — E785 Hyperlipidemia, unspecified: Secondary | ICD-10-CM | POA: Diagnosis not present

## 2019-08-13 DIAGNOSIS — G250 Essential tremor: Secondary | ICD-10-CM | POA: Diagnosis not present

## 2019-08-13 DIAGNOSIS — M1710 Unilateral primary osteoarthritis, unspecified knee: Secondary | ICD-10-CM | POA: Diagnosis not present

## 2019-08-13 DIAGNOSIS — Z87898 Personal history of other specified conditions: Secondary | ICD-10-CM | POA: Diagnosis not present

## 2019-08-13 DIAGNOSIS — G4733 Obstructive sleep apnea (adult) (pediatric): Secondary | ICD-10-CM | POA: Diagnosis not present

## 2019-08-13 DIAGNOSIS — I10 Essential (primary) hypertension: Secondary | ICD-10-CM | POA: Diagnosis not present

## 2019-08-13 DIAGNOSIS — R251 Tremor, unspecified: Secondary | ICD-10-CM | POA: Diagnosis not present

## 2019-08-15 DIAGNOSIS — I10 Essential (primary) hypertension: Secondary | ICD-10-CM | POA: Diagnosis not present

## 2019-08-15 DIAGNOSIS — R251 Tremor, unspecified: Secondary | ICD-10-CM | POA: Diagnosis not present

## 2019-08-15 DIAGNOSIS — I69351 Hemiplegia and hemiparesis following cerebral infarction affecting right dominant side: Secondary | ICD-10-CM | POA: Diagnosis not present

## 2019-08-15 DIAGNOSIS — E785 Hyperlipidemia, unspecified: Secondary | ICD-10-CM | POA: Diagnosis not present

## 2019-08-15 DIAGNOSIS — M1710 Unilateral primary osteoarthritis, unspecified knee: Secondary | ICD-10-CM | POA: Diagnosis not present

## 2019-08-15 DIAGNOSIS — G4733 Obstructive sleep apnea (adult) (pediatric): Secondary | ICD-10-CM | POA: Diagnosis not present

## 2019-08-16 DIAGNOSIS — E538 Deficiency of other specified B group vitamins: Secondary | ICD-10-CM | POA: Diagnosis not present

## 2019-08-16 DIAGNOSIS — E559 Vitamin D deficiency, unspecified: Secondary | ICD-10-CM | POA: Diagnosis not present

## 2019-08-16 DIAGNOSIS — Z8673 Personal history of transient ischemic attack (TIA), and cerebral infarction without residual deficits: Secondary | ICD-10-CM | POA: Diagnosis not present

## 2019-08-19 DIAGNOSIS — I69351 Hemiplegia and hemiparesis following cerebral infarction affecting right dominant side: Secondary | ICD-10-CM | POA: Diagnosis not present

## 2019-08-19 DIAGNOSIS — R251 Tremor, unspecified: Secondary | ICD-10-CM | POA: Diagnosis not present

## 2019-08-19 DIAGNOSIS — E785 Hyperlipidemia, unspecified: Secondary | ICD-10-CM | POA: Diagnosis not present

## 2019-08-19 DIAGNOSIS — I10 Essential (primary) hypertension: Secondary | ICD-10-CM | POA: Diagnosis not present

## 2019-08-19 DIAGNOSIS — M1710 Unilateral primary osteoarthritis, unspecified knee: Secondary | ICD-10-CM | POA: Diagnosis not present

## 2019-08-19 DIAGNOSIS — G4733 Obstructive sleep apnea (adult) (pediatric): Secondary | ICD-10-CM | POA: Diagnosis not present

## 2019-08-22 DIAGNOSIS — R251 Tremor, unspecified: Secondary | ICD-10-CM | POA: Diagnosis not present

## 2019-08-22 DIAGNOSIS — E785 Hyperlipidemia, unspecified: Secondary | ICD-10-CM | POA: Diagnosis not present

## 2019-08-22 DIAGNOSIS — M1710 Unilateral primary osteoarthritis, unspecified knee: Secondary | ICD-10-CM | POA: Diagnosis not present

## 2019-08-22 DIAGNOSIS — I10 Essential (primary) hypertension: Secondary | ICD-10-CM | POA: Diagnosis not present

## 2019-08-22 DIAGNOSIS — I69351 Hemiplegia and hemiparesis following cerebral infarction affecting right dominant side: Secondary | ICD-10-CM | POA: Diagnosis not present

## 2019-08-22 DIAGNOSIS — G4733 Obstructive sleep apnea (adult) (pediatric): Secondary | ICD-10-CM | POA: Diagnosis not present

## 2019-08-27 DIAGNOSIS — I639 Cerebral infarction, unspecified: Secondary | ICD-10-CM | POA: Diagnosis not present

## 2019-08-30 DIAGNOSIS — R251 Tremor, unspecified: Secondary | ICD-10-CM | POA: Diagnosis not present

## 2019-08-30 DIAGNOSIS — I10 Essential (primary) hypertension: Secondary | ICD-10-CM | POA: Diagnosis not present

## 2019-08-30 DIAGNOSIS — Z6841 Body Mass Index (BMI) 40.0 and over, adult: Secondary | ICD-10-CM | POA: Diagnosis not present

## 2019-08-30 DIAGNOSIS — M1710 Unilateral primary osteoarthritis, unspecified knee: Secondary | ICD-10-CM | POA: Diagnosis not present

## 2019-08-30 DIAGNOSIS — I69351 Hemiplegia and hemiparesis following cerebral infarction affecting right dominant side: Secondary | ICD-10-CM | POA: Diagnosis not present

## 2019-08-30 DIAGNOSIS — E785 Hyperlipidemia, unspecified: Secondary | ICD-10-CM | POA: Diagnosis not present

## 2019-08-30 DIAGNOSIS — G4733 Obstructive sleep apnea (adult) (pediatric): Secondary | ICD-10-CM | POA: Diagnosis not present

## 2019-09-12 ENCOUNTER — Telehealth: Payer: Self-pay | Admitting: Family Medicine

## 2019-09-12 NOTE — Telephone Encounter (Signed)
Pt calling - states that he had a stroke on 07/23/2019 and hsi valsartan (DIOVAN) 80 MG tablet was cut to 40mg .  Pt wants to know if he should continue the 40mg  or if he needs to go back on the 80mg .

## 2019-09-13 ENCOUNTER — Encounter: Payer: Self-pay | Admitting: Family Medicine

## 2019-09-17 NOTE — Progress Notes (Signed)
Name: Tom Underwood   MRN: [id number]    DOB: 08-22-1951   Date:09/18/2019       Progress Note  Subjective  Chief Complaint  Chief Complaint  Patient presents with  . Hospitalization Follow-up    HPI  Hospital discharge follow up: Mr. Unrein developed right side weakness on 07/22/2019 noticed around 11 pm at night, he woke up the following day still having right side weakness and called 911 and was transported by EMS to [facility] on 07/23/2019 he was outside the window to receive tPA, MRI showed acute stroke of left corona radiata and chronic lacunar infarcts of basal ganglia and  Pons. He was given high dose Plavix followed by 75 mg daily to take for at least 21 days ( finish on 08/13/2019), after that aspirin, also advised to go up on Crestor to 20 mg and be more compliant with medication. He already had outpatient follow up with neurologist, he had home PT for 4 weeks and is now doing home exercises . He was advised to resume CPAP machine and had a new study on 08/02/2019 he has been compliant with CPAP machine since. He is still using a cane to help with his gait. He is frustrated because he has more strength but still still has difficulty walking   He was discharged on 07/29/2019   Labs and studies: LDL 116, A1C 5.5%,TTE negative   Essential Tremor: seen by Dr. Sherryll Burger , given reassurance, he was given medication options but he decided not to at this time   Patient Active Problem List   Diagnosis Date Noted  . History of kidney stones 05/02/2018  . ED (erectile dysfunction) 05/02/2018  . Hyperglycemia 01/30/2017  . Arthritis of knee, degenerative 01/30/2017  . BPH with obstruction/lower urinary tract symptoms 02/03/2016  . Gross hematuria 02/03/2016  . Dyslipidemia 08/04/2015  . Skin lesions 07/14/2015  . Diverticulitis of large intestine without perforation or abscess without bleeding 06/30/2015  . Edema 06/30/2015  . OSA on CPAP 06/30/2015  . Kidney pain 06/30/2015    Past  Surgical History:  Procedure Laterality Date  . EXTERNAL EAR SURGERY    . FINGER SURGERY    . KNEE SURGERY    . left eye      Family History  Problem Relation Age of Onset  . Asthma Mother   . Heart disease Mother   . Heart attack Mother   . Dementia Mother   . Asthma Father   . Heart disease Father   . Stroke Father     Social History   Socioeconomic History  . Marital status: Married    Spouse name: [name]   . Number of children: 2  . Years of education: Not on file  . Highest education level: Not on file  Occupational History  . Occupation: retired   Engineer, production  . Financial resource strain: Not hard at all  . Food insecurity    Worry: Never true    Inability: Never true  . Transportation needs    Medical: No    Non-medical: No  Tobacco Use  . Smoking status: Former Smoker    Types: Pipe    Start date: 02/20/1973    Quit date: 02/20/1974    Years since quitting: 45.6  . Smokeless tobacco: Never Used  Substance and Sexual Activity  . Alcohol use: No    Alcohol/week: 0.0 standard drinks  . Drug use: No  . Sexual activity: Yes    Partners: Female  Lifestyle  .  Physical activity    Days per week: 0 days    Minutes per session: 0 min  . Stress: Not at all  Relationships  . Social connections    Talks on phone: More than three times a week    Gets together: Twice a week    Attends religious service: More than 4 times per year    Active member of club or organization: Yes    Attends meetings of clubs or organizations: More than 4 times per year    Relationship status: Married  . Intimate partner violence    Fear of current or ex partner: No    Emotionally abused: No    Physically abused: No    Forced sexual activity: No  Other Topics Concern  . Not on file  Social History Narrative  . Not on file     Current Outpatient Medications:  .  acetaminophen (TYLENOL) 650 MG CR tablet, Take 650 mg by mouth every 8 (eight) hours as needed for pain., Disp: ,  Rfl:  .  amitriptyline (ELAVIL) 25 MG tablet, Take 1 tablet (25 mg total) by mouth at bedtime as needed. for sleep, Disp: 90 tablet, Rfl: 1 .  aspirin EC 81 MG tablet, Take 1 tablet (81 mg total) by mouth daily., Disp: 30 tablet, Rfl: 0 .  glucosamine-chondroitin 500-400 MG tablet, Take 1 tablet by mouth 3 (three) times daily., Disp: , Rfl:  .  Multiple Vitamin (MULTIVITAMIN) tablet, Take 1 tablet by mouth daily., Disp: , Rfl:  .  omega-3 acid ethyl esters (LOVAZA) 1 g capsule, Take 2 capsules (2 g total) by mouth 2 (two) times daily., Disp: 120 capsule, Rfl: 5 .  pantoprazole (PROTONIX) 20 MG tablet, Take by mouth., Disp: , Rfl:  .  rosuvastatin (CRESTOR) 20 MG tablet, Take 20 mg by mouth daily., Disp: , Rfl:  .  Saccharomyces boulardii (PROBIOTIC) 250 MG CAPS, Take 1 capsule by mouth daily., Disp: 30 capsule, Rfl: 0 .  valsartan (DIOVAN) 40 MG tablet, Take 40 mg by mouth daily., Disp: , Rfl:  .  clopidogrel (PLAVIX) 75 MG tablet, TAKE 1 TABLET BY MOUTH ONCE DAILY FOR 14 DAYS, Disp: , Rfl:  .  Icosapent Ethyl (VASCEPA) 1 g CAPS, Take 2 capsules (2 g total) by mouth 2 (two) times a day. (Patient not taking: Reported on 09/18/2019), Disp: 120 capsule, Rfl: 2 .  meloxicam (MOBIC) 15 MG tablet, Take 1 tablet by mouth once daily (Patient not taking: Reported on 09/18/2019), Disp: 30 tablet, Rfl: 0 .  sildenafil (REVATIO) 20 MG tablet, Take 3 to 5 tablets two hours before intercouse on an empty stomach.  Do not take with nitrates. (Patient not taking: Reported on 09/18/2019), Disp: 50 tablet, Rfl: 3 .  sildenafil (VIAGRA) 50 MG tablet, Take 1 tablet (50 mg total) by mouth daily as needed for erectile dysfunction (Take 1-2 tablets two hours before intercouse on an empty stomach.  Do not take with nitrates.). (Patient not taking: Reported on 09/18/2019), Disp: 50 tablet, Rfl: 0  Allergies  Allergen Reactions  . Penicillins Itching    I personally reviewed active problem list, medication list,  allergies, family history, social history, health maintenance with the patient/caregiver today.   ROS  Constitutional: Negative for fever, positive for  weight change.  Respiratory: Negative for cough and shortness of breath.   Cardiovascular: Negative for chest pain or palpitations.  Gastrointestinal: Positive  for abdominal pain, no bowel changes.  Musculoskeletal: Negative for gait problem but no  joint swelling.  Skin: Negative for rash.  Neurological: Negative for dizziness or headache.  No other specific complaints in a complete review of systems (except as listed in HPI above).  Objective  Vitals:   09/18/19 1132 09/18/19 1133  BP: (!) 150/80 140/80  Pulse: 99   Resp: 16   Temp: (!) 97.3 F (36.3 C)   TempSrc: Temporal   SpO2: 95%   Weight: 286 lb 4.8 oz (129.9 kg)   Height: 5\' 11"  (1.803 m)     Body mass index is 39.93 kg/m.  Physical Exam  Constitutional: Patient appears well-developed and well-nourished. Obese No distress.  HEENT: head atraumatic, normocephalic, pupils equal and reactive to light Cardiovascular: Normal rate, regular rhythm and normal heart sounds.  No murmur heard. No BLE edema. Pulmonary/Chest: Effort normal and breath sounds normal. No respiratory distress. Abdominal: Soft.  There is no tenderness. Psychiatric: Patient has a normal mood and affect. behavior is normal. Judgment and thought content normal. Neurological: using a cane, tremors on both hands at rest and during intention, he was able to walk without a cane, but favoring left leg, slight change in sensation on lower leg, grip weaker on right side   Recent Results (from the past 2160 hour(s))  PSA     Status: None   Collection Time: 06/20/19  2:04 PM  Result Value Ref Range   Prostate Specific Ag, Serum 0.3 0.0 - 4.0 ng/mL    Comment: Roche ECLIA methodology. According to the American Urological Association, Serum PSA should decrease and remain at undetectable levels after  radical prostatectomy. The AUA defines biochemical recurrence as an initial PSA value 0.2 ng/mL or greater followed by a subsequent confirmatory PSA value 0.2 ng/mL or greater. Values obtained with different assay methods or kits cannot be used interchangeably. Results cannot be interpreted as absolute evidence of the presence or absence of malignant disease.      PHQ2/9: Depression screen [facility] 2/9 09/18/2019 07/02/2019 02/21/2019 10/19/2018 10/05/2018  Decreased Interest 0 0 0 0 1  Down, Depressed, Hopeless 0 0 0 0 1  PHQ - 2 Score 0 0 0 0 2  Altered sleeping 0 0 0 - 0  Tired, decreased energy 0 0 0 - 0  Change in appetite - 0 0 - 1  Feeling bad or failure about yourself  0 0 0 - 1  Trouble concentrating 0 0 0 - 0  Moving slowly or fidgety/restless 0 0 0 - 1  Suicidal thoughts 0 0 0 - 0  PHQ-9 Score 0 0 0 - 5  Difficult doing work/chores - - - - Not difficult at all    phq 9 is negative   Fall Risk: Fall Risk  06/24/2019 02/21/2019 10/19/2018 10/05/2018 05/02/2018  Falls in the past year? 0 0 0 0 No  Number falls in past yr: - 0 - 0 -  Injury with Fall? - 0 - 0 -     Functional Status Survey: Is the patient deaf or have difficulty hearing?: No Does the patient have difficulty seeing, even when wearing glasses/contacts?: Yes Does the patient have difficulty concentrating, remembering, or making decisions?: No Does the patient have difficulty walking or climbing stairs?: Yes Does the patient have difficulty dressing or bathing?: No Does the patient have difficulty doing errands alone such as visiting a doctor's office or shopping?: No    Assessment & Plan   1. Hemiparesis of right dominant side as late effect of cerebral infarction (H[facility])  Continue aspirin, rosuvastatin  and increase lovaza to BID   2. Need for immunization against influenza  - Flu Vaccine QUAD High Dose(Fluad)  3. OSA (obstructive sleep apnea)  He has been compliant with CPAP   4. Benign essential  tremor  Stable  5. Hypertension, benign  BP above goal we will adjust dose again to 80 mg daily  - valsartan (DIOVAN) 80 MG tablet; Take 1 tablet (80 mg total) by mouth daily.  Dispense: 90 tablet; Refill: 1

## 2019-09-18 ENCOUNTER — Encounter: Payer: Self-pay | Admitting: Family Medicine

## 2019-09-18 ENCOUNTER — Other Ambulatory Visit: Payer: Self-pay

## 2019-09-18 ENCOUNTER — Ambulatory Visit (INDEPENDENT_AMBULATORY_CARE_PROVIDER_SITE_OTHER): Payer: Medicare Other | Admitting: Family Medicine

## 2019-09-18 VITALS — BP 140/80 | HR 99 | Temp 97.3°F | Resp 16 | Ht 71.0 in | Wt 286.3 lb

## 2019-09-18 DIAGNOSIS — I69351 Hemiplegia and hemiparesis following cerebral infarction affecting right dominant side: Secondary | ICD-10-CM | POA: Diagnosis not present

## 2019-09-18 DIAGNOSIS — I10 Essential (primary) hypertension: Secondary | ICD-10-CM

## 2019-09-18 DIAGNOSIS — G250 Essential tremor: Secondary | ICD-10-CM | POA: Diagnosis not present

## 2019-09-18 DIAGNOSIS — G4733 Obstructive sleep apnea (adult) (pediatric): Secondary | ICD-10-CM

## 2019-09-18 DIAGNOSIS — Z23 Encounter for immunization: Secondary | ICD-10-CM | POA: Diagnosis not present

## 2019-09-18 MED ORDER — VALSARTAN 80 MG PO TABS: 80.0000 mg | ORAL_TABLET | Freq: Every day | ORAL | 1 refills | Status: DC

## 2019-10-11 ENCOUNTER — Telehealth: Payer: Self-pay

## 2019-10-11 NOTE — Telephone Encounter (Signed)
Copied from [place] [phone]. Topic: General - Call Back - No Documentation >> Oct 11, 2019  4:25 PM Erick Blinks wrote: Reason for CRM: Requesting call back from clinical staff regarding questions about the shingrix/pneumonia vaccines. Please advise

## 2019-10-11 NOTE — Telephone Encounter (Signed)
Tried calling patient and it was giving me a busy signal. Please inquire what questions he has on the vaccines Shingrex and Pneumonia.

## 2019-10-14 ENCOUNTER — Ambulatory Visit: Payer: Medicare Other

## 2019-10-18 DIAGNOSIS — Z9989 Dependence on other enabling machines and devices: Secondary | ICD-10-CM | POA: Diagnosis not present

## 2019-10-18 DIAGNOSIS — Z8673 Personal history of transient ischemic attack (TIA), and cerebral infarction without residual deficits: Secondary | ICD-10-CM | POA: Diagnosis not present

## 2019-10-18 DIAGNOSIS — G4733 Obstructive sleep apnea (adult) (pediatric): Secondary | ICD-10-CM | POA: Diagnosis not present

## 2019-10-18 DIAGNOSIS — I10 Essential (primary) hypertension: Secondary | ICD-10-CM | POA: Diagnosis not present

## 2019-11-04 ENCOUNTER — Other Ambulatory Visit: Payer: Self-pay

## 2019-11-05 ENCOUNTER — Encounter: Payer: Self-pay | Admitting: Family Medicine

## 2019-11-05 ENCOUNTER — Other Ambulatory Visit: Payer: Self-pay

## 2019-11-05 ENCOUNTER — Ambulatory Visit (INDEPENDENT_AMBULATORY_CARE_PROVIDER_SITE_OTHER): Payer: Medicare Other | Admitting: Family Medicine

## 2019-11-05 VITALS — BP 132/76 | HR 95 | Temp 96.8°F | Resp 16 | Ht 71.0 in | Wt 293.8 lb

## 2019-11-05 DIAGNOSIS — M25542 Pain in joints of left hand: Secondary | ICD-10-CM | POA: Diagnosis not present

## 2019-11-05 DIAGNOSIS — E785 Hyperlipidemia, unspecified: Secondary | ICD-10-CM

## 2019-11-05 DIAGNOSIS — Z23 Encounter for immunization: Secondary | ICD-10-CM

## 2019-11-05 DIAGNOSIS — R739 Hyperglycemia, unspecified: Secondary | ICD-10-CM | POA: Diagnosis not present

## 2019-11-05 DIAGNOSIS — I69351 Hemiplegia and hemiparesis following cerebral infarction affecting right dominant side: Secondary | ICD-10-CM | POA: Diagnosis not present

## 2019-11-05 DIAGNOSIS — M25541 Pain in joints of right hand: Secondary | ICD-10-CM | POA: Diagnosis not present

## 2019-11-05 DIAGNOSIS — G4733 Obstructive sleep apnea (adult) (pediatric): Secondary | ICD-10-CM | POA: Diagnosis not present

## 2019-11-05 DIAGNOSIS — I10 Essential (primary) hypertension: Secondary | ICD-10-CM

## 2019-11-05 DIAGNOSIS — G250 Essential tremor: Secondary | ICD-10-CM

## 2019-11-05 NOTE — Progress Notes (Signed)
Name: Tom Underwood   MRN: [id number]    DOB: January 15, 1951   Date:11/05/2019       Progress Note  Subjective  Chief Complaint  Chief Complaint  Patient presents with  . Follow-up    he has concerns about why he isn't getting any stronger since he had stroke. Always seem to hurt in his right shoulder when he does he PT exercieses.  . Tremors    He very seldom has tremors anymore.    HPI  History of CVA: he states he has been discourage because of slow progression of recovery, he states recently for Sleep Apnea follow up and was given good information about time of recovery. He is still using a cane to assist with ambulation but is draining because he has to focus on everything he does   Tremors: much better now, only present when excited or nervous.   OSA: wearing CPAP every night for 4 or more hours per night on 70 % of nights . He states he is sleeping better, adjusting to the machine, wearing nose mask, could not tolerate full mask.   HTN: heis now on Diovan 80 mg daily , no chest pain, palpitation or dizziness. BP at home has been around 130's/70's, bp at neurologist was 150's, yesterday at home in the 140's. Discussed going up to 160 mg of Diovan, but he states he will try taking daily at the same time and monitor for now  Hyperlipidemia: he is taking Crestor and currently on Lovaza, last LDL was 75, goal for him is below 70. Since CVA dose has gone up from 5 mg to 20 mg and we will recheck labs today   SAY:TKZSW the care ofShannon McGowan for his urological care. Last PSA was done in 06/2019  and it was  goal   Morbid obesity / pre diabetes:he is not on medication, he is trying to eat healthier, weight has been stable now, cutting on portion size.   Arthralgia: still has hand stiffness in his hands, but it resolves once he starts to move it, maybe a couple of minutes, he also has trigger fingers on both hands.   Patient Active Problem List   Diagnosis Date Noted  .  Hemiparesis affecting right side as late effect of cerebrovascular accident ([place]) 08/13/2019  . Other sleep apnea 08/06/2019  . History of kidney stones 05/02/2018  . ED (erectile dysfunction) 05/02/2018  . Hyperglycemia 01/30/2017  . Arthritis of knee, degenerative 01/30/2017  . BPH with obstruction/lower urinary tract symptoms 02/03/2016  . Gross hematuria 02/03/2016  . Dyslipidemia 08/04/2015  . Skin lesions 07/14/2015  . Diverticulitis of large intestine without perforation or abscess without bleeding 06/30/2015  . Edema 06/30/2015  . OSA on CPAP 06/30/2015  . Kidney pain 06/30/2015    Past Surgical History:  Procedure Laterality Date  . EXTERNAL EAR SURGERY    . FINGER SURGERY    . KNEE SURGERY    . left eye      Family History  Problem Relation Age of Onset  . Asthma Mother   . Heart disease Mother   . Heart attack Mother   . Dementia Mother   . Asthma Father   . Heart disease Father   . Stroke Father     Social History   Socioeconomic History  . Marital status: Married    Spouse name: Butch Penny   . Number of children: 2  . Years of education: Not on file  . Highest education level:  Not on file  Occupational History  . Occupation: retired   Engineer, production  . Financial resource strain: Not hard at all  . Food insecurity    Worry: Never true    Inability: Never true  . Transportation needs    Medical: No    Non-medical: No  Tobacco Use  . Smoking status: Former Smoker    Types: Pipe    Start date: 02/20/1973    Quit date: 02/20/1974    Years since quitting: 45.7  . Smokeless tobacco: Never Used  Substance and Sexual Activity  . Alcohol use: No    Alcohol/week: 0.0 standard drinks  . Drug use: No  . Sexual activity: Yes    Partners: Female  Lifestyle  . Physical activity    Days per week: 0 days    Minutes per session: 0 min  . Stress: Not at all  Relationships  . Social connections    Talks on phone: More than three times a week    Gets together:  Twice a week    Attends religious service: More than 4 times per year    Active member of club or organization: Yes    Attends meetings of clubs or organizations: More than 4 times per year    Relationship status: Married  . Intimate partner violence    Fear of current or ex partner: No    Emotionally abused: No    Physically abused: No    Forced sexual activity: No  Other Topics Concern  . Not on file  Social History Narrative  . Not on file     Current Outpatient Medications:  .  acetaminophen (TYLENOL) 650 MG CR tablet, Take 650 mg by mouth every 8 (eight) hours as needed for pain., Disp: , Rfl:  .  amitriptyline (ELAVIL) 25 MG tablet, Take 1 tablet (25 mg total) by mouth at bedtime as needed. for sleep, Disp: 90 tablet, Rfl: 1 .  aspirin EC 81 MG tablet, Take 1 tablet (81 mg total) by mouth daily., Disp: 30 tablet, Rfl: 0 .  Cetirizine HCl 10 MG CAPS, , Disp: , Rfl:  .  glucosamine-chondroitin 500-400 MG tablet, Take 1 tablet by mouth 3 (three) times daily., Disp: , Rfl:  .  Multiple Vitamin (MULTIVITAMIN) tablet, Take 1 tablet by mouth daily., Disp: , Rfl:  .  omega-3 acid ethyl esters (LOVAZA) 1 g capsule, Take 2 capsules (2 g total) by mouth 2 (two) times daily., Disp: 120 capsule, Rfl: 5 .  pantoprazole (PROTONIX) 20 MG tablet, Take by mouth., Disp: , Rfl:  .  rosuvastatin (CRESTOR) 20 MG tablet, Take 20 mg by mouth daily., Disp: , Rfl:  .  Saccharomyces boulardii (PROBIOTIC) 250 MG CAPS, Take 1 capsule by mouth daily., Disp: 30 capsule, Rfl: 0 .  sildenafil (REVATIO) 20 MG tablet, Take 3 to 5 tablets two hours before intercouse on an empty stomach.  Do not take with nitrates., Disp: 50 tablet, Rfl: 3 .  valsartan (DIOVAN) 80 MG tablet, Take 1 tablet (80 mg total) by mouth daily., Disp: 90 tablet, Rfl: 1  Allergies  Allergen Reactions  . Penicillins Itching    I personally reviewed active problem list, medication list, allergies, family history, social history, health  maintenance with the patient/caregiver today.   ROS  Constitutional: Negative for fever or weight change.  Respiratory: Negative for cough and shortness of breath.   Cardiovascular: Negative for chest pain or palpitations.  Gastrointestinal: Negative for abdominal pain, no bowel changes.  Musculoskeletal: Positive for gait problem but no  joint swelling.  Skin: Negative for rash.  Neurological: Negative for dizziness or headache.  No other specific complaints in a complete review of systems (except as listed in HPI above).   Objective  Vitals:   11/05/19 0849  BP: 132/76  Pulse: 95  Resp: 16  Temp: (!) 96.8 F (36 C)  TempSrc: Temporal  SpO2: 96%  Weight: 293 lb 12.8 oz (133.3 kg)  Height: 5\' 11"  (1.803 m)    Body mass index is 40.98 kg/m.  Physical Exam  Constitutional: Patient appears well-developed and well-nourished. Obese  No distress.  HEENT: head atraumatic, normocephalic, pupils equal and reactive to light Cardiovascular: Normal rate, regular rhythm and normal heart sounds.  No murmur heard. No BLE edema. Pulmonary/Chest: Effort normal and breath sounds normal. No respiratory distress. Abdominal: Soft.  There is no tenderness. Psychiatric: Patient has a normal mood and affect. behavior is normal. Judgment and thought content normal Neurological still has right side hemiparesis, able to walk with a cane   PHQ2/9: [facility] 11/05/2019 09/18/2019 07/02/2019 02/21/2019 10/19/2018  Decreased Interest 0 0 0 0 0  Down, Depressed, Hopeless 0 0 0 0 0  PHQ - 2 Score 0 0 0 0 0  Altered sleeping 0 0 0 0 -  Tired, decreased energy 0 0 0 0 -  Change in appetite 0 - 0 0 -  Feeling bad or failure about yourself  0 0 0 0 -  Trouble concentrating 0 0 0 0 -  Moving slowly or fidgety/restless 0 0 0 0 -  Suicidal thoughts 0 0 0 0 -  PHQ-9 Score 0 0 0 0 -  Difficult doing work/chores - - - - -    phq 9 is negative   Fall Risk: Fall Risk  11/05/2019  06/24/2019 02/21/2019 10/19/2018 10/05/2018  Falls in the past year? 0 0 0 0 0  Number falls in past yr: 0 - 0 - 0  Injury with Fall? 0 - 0 - 0    Functional Status Survey: Is the patient deaf or have difficulty hearing?: No Does the patient have difficulty seeing, even when wearing glasses/contacts?: Yes Does the patient have difficulty concentrating, remembering, or making decisions?: No Does the patient have difficulty walking or climbing stairs?: Yes Does the patient have difficulty dressing or bathing?: Yes Does the patient have difficulty doing errands alone such as visiting a doctor's office or shopping?: No   Assessment & Plan  1. Need for Streptococcus pneumoniae vaccination  - Pneumococcal conjugate vaccine 13-valent  2. Hemiparesis of right dominant side as late effect of cerebral infarction (HCC)  Compliant with medication, continue medications  3. OSA (obstructive sleep apnea)  Keep wearing CPAP   4. Dyslipidemia  Recheck labs today  5. Hypertension, benign  Monitor bp at home  6. Benign essential tremor  Doing better   7. Morbid obesity (HCC)  Discussed with the patient the risk posed by an increased BMI. Discussed importance of portion control, calorie counting and at least 150 minutes of physical activity weekly. Avoid sweet beverages and drink more water. Eat at least 6 servings of fruit and vegetables daily   8. Arthralgia of hands, bilateral  stable

## 2019-11-06 LAB — COMPREHENSIVE METABOLIC PANEL
ALT: 27 IU/L (ref 0–44)
AST: 22 IU/L (ref 0–40)
Albumin/Globulin Ratio: 1.7 (ref 1.2–2.2)
Albumin: 4.8 g/dL (ref 3.8–4.8)
Alkaline Phosphatase: 129 IU/L — ABNORMAL HIGH (ref 39–117)
BUN/Creatinine Ratio: 16 (ref 10–24)
BUN: 15 mg/dL (ref 8–27)
Bilirubin Total: 0.9 mg/dL (ref 0.0–1.2)
CO2: 24 mmol/L (ref 20–29)
Calcium: 10 mg/dL (ref 8.6–10.2)
Chloride: 101 mmol/L (ref 96–106)
Creatinine, Ser: 0.93 mg/dL (ref 0.76–1.27)
GFR calc Af Amer: 99 mL/min/{1.73_m2} (ref >59)
GFR calc non Af Amer: 85 mL/min/{1.73_m2} (ref >59)
Globulin, Total: 2.9 g/dL (ref 1.5–4.5)
Glucose: 87 mg/dL (ref 65–99)
Potassium: 4.2 mmol/L (ref 3.5–5.2)
Sodium: 140 mmol/L (ref 134–144)
Total Protein: 7.7 g/dL (ref 6.0–8.5)

## 2019-11-06 LAB — CBC WITH DIFFERENTIAL/PLATELET
Basophils Absolute: 0 10*3/uL (ref 0.0–0.2)
Basos: 0 %
EOS (ABSOLUTE): 0.2 10*3/uL (ref 0.0–0.4)
Eos: 2 %
Hematocrit: 48.1 % (ref 37.5–51.0)
Hemoglobin: 16.5 g/dL (ref 13.0–17.7)
Immature Grans (Abs): 0 10*3/uL (ref 0.0–0.1)
Immature Granulocytes: 0 %
Lymphocytes Absolute: 2.3 10*3/uL (ref 0.7–3.1)
Lymphs: 33 %
MCH: 30.4 pg (ref 26.6–33.0)
MCHC: 34.3 g/dL (ref 31.5–35.7)
MCV: 89 fL (ref 79–97)
Monocytes Absolute: 0.6 10*3/uL (ref 0.1–0.9)
Monocytes: 9 %
Neutrophils Absolute: 4 10*3/uL (ref 1.4–7.0)
Neutrophils: 56 %
Platelets: 256 10*3/uL (ref 150–450)
RBC: 5.43 x10E6/uL (ref 4.14–5.80)
RDW: 12.9 % (ref 11.6–15.4)
WBC: 7.1 10*3/uL (ref 3.4–10.8)

## 2019-11-06 LAB — LIPID PANEL
Chol/HDL Ratio: 2.5 ratio (ref 0.0–5.0)
Cholesterol, Total: 125 mg/dL (ref 100–199)
HDL: 51 mg/dL (ref >39)
LDL Chol Calc (NIH): 60 mg/dL (ref 0–99)
Triglycerides: 67 mg/dL (ref 0–149)
VLDL Cholesterol Cal: 14 mg/dL (ref 5–40)

## 2019-11-06 LAB — HEMOGLOBIN A1C
Est. average glucose Bld gHb Est-mCnc: 111 mg/dL
Hgb A1c MFr Bld: 5.5 % (ref 4.8–5.6)

## 2019-12-24 DIAGNOSIS — Z8673 Personal history of transient ischemic attack (TIA), and cerebral infarction without residual deficits: Secondary | ICD-10-CM | POA: Diagnosis not present

## 2019-12-24 DIAGNOSIS — R269 Unspecified abnormalities of gait and mobility: Secondary | ICD-10-CM | POA: Diagnosis not present

## 2019-12-26 ENCOUNTER — Encounter: Payer: Self-pay | Admitting: Physical Therapy

## 2019-12-26 ENCOUNTER — Other Ambulatory Visit: Payer: Self-pay

## 2019-12-26 ENCOUNTER — Ambulatory Visit: Payer: Medicare Other | Attending: Neurology | Admitting: Physical Therapy

## 2019-12-26 DIAGNOSIS — R279 Unspecified lack of coordination: Secondary | ICD-10-CM

## 2019-12-26 DIAGNOSIS — R2689 Other abnormalities of gait and mobility: Secondary | ICD-10-CM | POA: Diagnosis not present

## 2019-12-26 DIAGNOSIS — M6281 Muscle weakness (generalized): Secondary | ICD-10-CM | POA: Insufficient documentation

## 2019-12-26 NOTE — Therapy (Signed)
[facility] [address]. [place], [place], [address] Phone: [phone]   Fax:  [phone]  Physical Therapy Evaluation  Patient Details  Name: Tom Underwood MRN: [id number] Date of Birth: [birth year] Referring Provider (PT): Cristopher Peru, MD   Encounter Date: 12/26/2019  PT End of Session - 12/26/19 1201    Visit Number  1    Number of Visits  12    Date for PT Re-Evaluation  02/06/20    Authorization - Visit Number  1    Authorization - Number of Visits  10    PT Start Time  1030    PT Stop Time  1145    PT Time Calculation (min)  75 min    Equipment Utilized During Treatment  Gait belt    Activity Tolerance  Patient tolerated treatment well    Behavior During Therapy  [facility] for tasks assessed/performed       Past Medical History:  Diagnosis Date  . Decreased libido   . Diverticulitis   . HLD (hyperlipidemia)   . Hydronephrosis with renal and ureteral calculus obstruction   . Hydronephrosis with renal and ureteral calculus obstruction   . Hypogonadism in male   . Rhinitis, allergic   . Sleep apnea   . Ureteral stone     Past Surgical History:  Procedure Laterality Date  . EXTERNAL EAR SURGERY    . FINGER SURGERY    . KNEE SURGERY    . left eye      There were no vitals filed for this visit.   Subjective Assessment - 12/26/19 1150    Subjective  Pt reports a mild R-sided stroke on 08/23/19 and spent a few days at [facility] and had minimal motor function loss. Pt. reports that he lives with his wife who is able to care for him working at home. Pt. reports he recieved home PT back in aug/sep and has been diligent about doing the exercises prescribed. Pt reports he is walking regularly on uneven ground for approx .2 mile uphill/downhill with SPC.  Pt. resides in a mobile home with 5-steps to enter with railings on both sides.    Pertinent History  Pt. had a right sided stroke and loves to watch football and  travel to the beach with his wife.    Limitations  Walking;Standing    How long can you walk comfortably?  0.2 miles before heaviness/fatigue    Patient Stated Goals  Increase walking distance, carry things with less pain in shoulder    Currently in Pain?  Yes    Pain Score  0-No pain    Pain Location  Leg    Pain Orientation  Right    Pain Descriptors / Indicators  Heaviness    Pain Type  Chronic pain    Pain Onset  More than a month ago    Pain Frequency  Constant    Aggravating Factors   Walking    Pain Relieving Factors  Resting    Effect of Pain on Daily Activities  Walking extended periods of time, Yard Work,    Multiple Pain Sites  Yes    Pain Score  0    Pain Location  Shoulder    Pain Orientation  Right    Pain Descriptors / Indicators  Heaviness    Pain Type  Chronic pain    Pain Onset  More than a month ago    Pain Frequency  Constant  Aggravating Factors   Lifting overhead         [facility] PT Assessment - 12/26/19 0001      Assessment   Medical Diagnosis  Impaired Gait    Referring Provider (PT)  Cristopher Peru, MD    Onset Date/Surgical Date  07/23/19    Hand Dominance  Right    Prior Therapy  Home Physical Therapy      Precautions   Precautions  None      Restrictions   Weight Bearing Restrictions  No      Balance Screen   Has the patient fallen in the past 6 months  No    Has the patient had a decrease in activity level because of a fear of falling?   Yes    Is the patient reluctant to leave their home because of a fear of falling?   No      Home Environment   Living Environment  Private residence    Living Arrangements  Spouse/significant other    Type of Home  Mobile home    Home Access  Stairs to enter    Entrance Stairs-Number of Steps  5    Entrance Stairs-Rails  Right;Left;Can reach both      Prior Function   Level of Independence  Independent      Cognition   Overall Cognitive Status  Within Functional Limits for tasks assessed       Observation Gait: Decreased arm swing, heel strike, stride length, step length, ER of the R foot. Usage of SPC in community; Pt. Reports not using an AD in the home.  Functional Activity: Sit to stand with slight dizziness and LOB with first repetition. Usage of wide BOS to stand due to increased central adipose tissue  Active Range of Motion Assessment (AROM) Shoulder Flexion: R 79 deg *pain in deltoid / L 131 deg Shoulder Abduction: R 108 deg/ L 109 deg  Manual Muscle Testing (MMT) Shoulder Flexion: R 4-/5   L 5/5 Shoulder Abduction: R 4-/4   L 5/5  Elbow Flexion: R 4+/5   L 5/5 Elbow Extension: 5/5 bilat.  Hip Flexion: 4-/5 bilat Hip ABD/ADD: 5/5 bilat.  Knee Flexion/Extension: 5/5 bilat.  Grip Strength: R 68 *hand felt heavy  L 82  Balance Testing BERG Balance Test: 47/56 (pt. Had difficulties with turning over left/right shoulder, reaching to 6.3 inches, SL Balance)   Objective measurements completed on examination: See above findings.     PT Education - 12/26/19 1201    Education Details  Pt. was educated to continue his HEP presribed from his home physical therapy.    Person(s) Educated  Patient    Methods  Demonstration;Handout    Comprehension  Returned demonstration          PT Long Term Goals - 12/26/19 1229      PT LONG TERM GOAL #1   Title  Pt. will be able to ambulate with least assistive device for 0.8 mile with good gait mechanics in home community to improve walking endurance.    Baseline  Pt. reports he can walk 0.2 mile in his mobile home community    Time  6    Period  Weeks    Status  New    Target Date  02/06/20      PT LONG TERM GOAL #2   Title  Pt. will improve FOTO score to > 57 to show improvements in LE function for ADLs    Baseline  41    Time  6    Period  Weeks    Status  New    Target Date  02/06/20      PT LONG TERM GOAL #3   Title  Pt. will increase R LE strength to grossly 5/5 to improve gait mechanics and LE strength  for ADLs    Baseline  R hip flexion 4-/5, knee flexion 4+/5, ankle everison 4+/5    Time  6    Period  Weeks    Status  New    Target Date  02/06/20      PT LONG TERM GOAL #4   Title  Pt. will increase bilat UE flexion/abduction to 120 deg. for functional mobility for overhead reaching and bathing.    Baseline  R Flexion 79 with pain in deltoid, R Abduction 108; L flexion 131, L Abduction 109    Time  6    Period  Weeks    Status  New    Target Date  02/06/20         Plan - 12/26/19 1202    Clinical Impression Statement  Pt. is a pleasant 69 y/o male who presents s/p R CVA on 07/23/2019. Pt. ambulates with a SPC in his L hand with decreased bilat. arm swing, stride/step length, heel strike, knee/hip flexion/extension, and slight external rotation of the R ankle. Pt. describes heaviness in R UE and LE during gait activities and ADLs. Pt. grossly has 5/5 bilat. LE strength except for bilat. hip flexion 4-/5 and R foot eversion is a 4+/5. Pt. has decreased bilat. UE shoulder ROM in flexion/abduction (R 79 deg./108 deg.,  L 131 deg./109 deg.).  Pt. has decreased R grip strength compared to the L (R68 deg./L 82 deg.) and is R hand dominate. Pt. reports having difficulty with bed mobility activites and must enter on the left side. Pt. has difficulty with long distance walking due to decreased endurance and heaviness sensation in R LE . BERG: 47/56. FOTO inital 41/ goal 57. Pt. will benefit from skilled PT to address impaired gait patterns, decreased balance/coordination, and ROM/strength deficits to be more independent with ADLs.    Personal Factors and Comorbidities  Comorbidity 2    Comorbidities  Hypertension, Obesity    Examination-Activity Limitations  Bed Mobility;Reach Overhead;Squat    Examination-Participation Restrictions  Community Activity;Yard Work    [employer]  Evolving/Moderate complexity    Clinical Decision Making  Moderate    Rehab Potential  Good    PT  Frequency  2x / week    PT Duration  6 weeks    PT Treatment/Interventions  ADLs/Self Care Home Management;Gait training;Stair training;Functional mobility training;Therapeutic activities;Therapeutic exercise;Balance training;Neuromuscular re-education;Manual techniques;Patient/family education    PT Next Visit Plan  New HEP, 6 min walk test, balance activities    Consulted and Agree with Plan of Care  Patient       Patient will benefit from skilled therapeutic intervention in order to improve the following deficits and impairments:  Abnormal gait, Decreased activity tolerance, Decreased balance, Decreased coordination, Decreased mobility, Decreased endurance, Decreased range of motion, Decreased strength, Difficulty walking, Dizziness, Impaired UE functional use, Pain  Visit Diagnosis: Muscle weakness (generalized)  Other abnormalities of gait and mobility  Unspecified lack of coordination     Problem List Patient Active Problem List   Diagnosis Date Noted  . Hemiparesis affecting right side as late effect of cerebrovascular accident ([place]) 08/13/2019  . Other sleep apnea 08/06/2019  .  History of kidney stones 05/02/2018  . ED (erectile dysfunction) 05/02/2018  . Hyperglycemia 01/30/2017  . Arthritis of knee, degenerative 01/30/2017  . BPH with obstruction/lower urinary tract symptoms 02/03/2016  . Gross hematuria 02/03/2016  . Dyslipidemia 08/04/2015  . Skin lesions 07/14/2015  . Diverticulitis of large intestine without perforation or abscess without bleeding 06/30/2015  . Edema 06/30/2015  . OSA on CPAP 06/30/2015  . Kidney pain 06/30/2015   Cammie Mcgee, PT, DPT # [id number] Verl Blalock, SPT 12/26/2019, 5:19 PM  [name] [address] [place], [place], [address] Phone: [phone]   Fax:  [phone]  Name: REED DADY MRN: [id number] Date of Birth: [birth year]

## 2019-12-31 ENCOUNTER — Other Ambulatory Visit: Payer: Self-pay

## 2019-12-31 ENCOUNTER — Ambulatory Visit: Payer: Medicare Other | Admitting: Physical Therapy

## 2019-12-31 ENCOUNTER — Encounter: Payer: Self-pay | Admitting: Physical Therapy

## 2019-12-31 DIAGNOSIS — R279 Unspecified lack of coordination: Secondary | ICD-10-CM | POA: Diagnosis not present

## 2019-12-31 DIAGNOSIS — M6281 Muscle weakness (generalized): Secondary | ICD-10-CM

## 2019-12-31 DIAGNOSIS — R2689 Other abnormalities of gait and mobility: Secondary | ICD-10-CM | POA: Diagnosis not present

## 2019-12-31 NOTE — Therapy (Signed)
[place] [facility] [address] [place], [place], [address] Phone: [phone]   Fax:  [phone]  Physical Therapy Treatment  Patient Details  Name: Tom Underwood MRN: [id number] Date of Birth: 05-Dec-1951 Referring Provider (PT): Cristopher Peru, MD   Encounter Date: 12/31/2019  PT End of Session - 12/31/19 1149    Visit Number  2    Number of Visits  12    Date for PT Re-Evaluation  02/06/20    Authorization - Visit Number  2    Authorization - Number of Visits  10    PT Start Time  1020    PT Stop Time  1135    PT Time Calculation (min)  75 min    Equipment Utilized During Treatment  Gait belt    Activity Tolerance  Patient tolerated treatment well;Patient limited by fatigue;No increased pain    Behavior During Therapy  WFL for tasks assessed/performed       Past Medical History:  Diagnosis Date  . Decreased libido   . Diverticulitis   . HLD (hyperlipidemia)   . Hydronephrosis with renal and ureteral calculus obstruction   . Hydronephrosis with renal and ureteral calculus obstruction   . Hypogonadism in male   . Rhinitis, allergic   . Sleep apnea   . Ureteral stone     Past Surgical History:  Procedure Laterality Date  . EXTERNAL EAR SURGERY    . FINGER SURGERY    . KNEE SURGERY    . left eye      There were no vitals filed for this visit.  Subjective Assessment - 01/01/20 0810    Subjective  Pt. states he is doing okay.  Pt. states he had difficulty picking something off this gravel driveway and return to stand.    Pertinent History  Pt. had a right sided stroke and loves to watch football and travel to the beach with his wife.    Limitations  Walking;Standing    How long can you walk comfortably?  0.2 miles before heaviness/fatigue    Patient Stated Goals  Increase walking distance, carry things with less pain in shoulder    Currently in Pain?  No/denies    Pain Onset  More than a month ago    Pain Onset  More  than a month ago        Therex  Nustep seat 11, L 4 10 min RTB shoulder flexion with the band anchored at //- bars within pain-free range 15 reps x 2  RTB diagonal pull-apart 15 x 2 (pt. Experienced increased tightness in the posterior deltoid) Review of issued HEP program with returned demonstration   Neuromuscular Re-education  Forward tandem walking with two hands/one hand holding bars for UE assist x 5 laps within //-bars, SBA/CGA Step over a step/two yoga blocks with step through pattern with one hand hold the bar for UE assist x 5 laps within //-bars alternating leading leg, SBA/CGA Forward marching with 3 s hold on the top during single leg stance phase with 3 finger-touch in both hands holding bars for UE assist X 3 laps  within //-bars, SBA/CGA  Manual Therapy Supine assessment of PROM of L shoulder flexion and abduction in supine Supine assessment of bilat. Proximal hamstring length and piriformis flexibility      PT Long Term Goals - 12/26/19 1229      PT LONG TERM GOAL #1   Title  Pt. will be able to ambulate with  least assistive device for 0.8 mile with good gait mechanics in home community to improve walking endurance.    Baseline  Pt. reports he can walk 0.2 mile in his mobile home community    Time  6    Period  Weeks    Status  New    Target Date  02/06/20      PT LONG TERM GOAL #2   Title  Pt. will improve FOTO score to > 57 to show improvements in LE function for ADLs    Baseline  41    Time  6    Period  Weeks    Status  New    Target Date  02/06/20      PT LONG TERM GOAL #3   Title  Pt. will increase R LE strength to grossly 5/5 to improve gait mechanics and LE strength for ADLs    Baseline  R hip flexion 4-/5, knee flexion 4+/5, ankle everison 4+/5    Time  6    Period  Weeks    Status  New    Target Date  02/06/20      PT LONG TERM GOAL #4   Title  Pt. will increase bilat UE flexion/abduction to 120 deg. for functional mobility for overhead  reaching and bathing.    Baseline  R Flexion 79 with pain in deltoid, R Abduction 108; L flexion 131, L Abduction 109    Time  6    Period  Weeks    Status  New    Target Date  02/06/20            Plan - 12/31/19 1149    Clinical Impression Statement  Pt. ambulated into the clinic today with decreased step length and slight external rotation of the R foot and a SPC in his R hand. Pt. was able to ride the NuStep for . 34 miles at level 4 for 10 minutes with increase in fatigue. Pt. was able to perform dynamic balance activities with one hand/fingertips for UE support with SLS walking and tandem stance. Pt. had increased bilateral hip circumduction to clear 3' yoga block when placed on the floor but was corrected with verbal and mirror feedback. Pt. experiences bilateral shoulder pain with AROM above 90 degs and has tightness/tenderness to palpation of the posterior deltoid. Pt. had decreased bilateral proximal hamstring length/flexibility. Pt. is highly motivated to complete HEP and wants to be more active. Pt. was limited by heavines in R LE and general LE fatigue.    Personal Factors and Comorbidities  Comorbidity 2    Comorbidities  Hypertension, Obesity    Examination-Activity Limitations  Bed Mobility;Reach Overhead;Squat;Lift    Examination-Participation Restrictions  Community Activity;Yard Work    Merchant navy officer  Evolving/Moderate complexity    Clinical Decision Making  Moderate    Rehab Potential  Good    PT Frequency  2x / week    PT Duration  6 weeks    PT Treatment/Interventions  ADLs/Self Care Home Management;Gait training;Stair training;Functional mobility training;Therapeutic activities;Therapeutic exercise;Balance training;Neuromuscular re-education;Manual techniques;Patient/family education    PT Next Visit Plan  6 min walk test, Balance/Proprioception    Consulted and Agree with Plan of Care  Patient       Patient will benefit from skilled  therapeutic intervention in order to improve the following deficits and impairments:  Abnormal gait, Decreased activity tolerance, Decreased balance, Decreased coordination, Decreased mobility, Decreased endurance, Decreased range of motion, Decreased strength, Difficulty walking, Dizziness,  Impaired UE functional use, Pain  Visit Diagnosis: Muscle weakness (generalized)  Other abnormalities of gait and mobility  Unspecified lack of coordination     Problem List Patient Active Problem List   Diagnosis Date Noted  . Hemiparesis affecting right side as late effect of cerebrovascular accident (HCC) 08/13/2019  . Other sleep apnea 08/06/2019  . History of kidney stones 05/02/2018  . ED (erectile dysfunction) 05/02/2018  . Hyperglycemia 01/30/2017  . Arthritis of knee, degenerative 01/30/2017  . BPH with obstruction/lower urinary tract symptoms 02/03/2016  . Gross hematuria 02/03/2016  . Dyslipidemia 08/04/2015  . Skin lesions 07/14/2015  . Diverticulitis of large intestine without perforation or abscess without bleeding 06/30/2015  . Edema 06/30/2015  . OSA on CPAP 06/30/2015  . Kidney pain 06/30/2015   Cammie Mcgee, PT, DPT # [id number] Verl Blalock, SPT 01/01/2020, 8:13 AM  [place] [facility] [address] [place], [place], [address] Phone: [phone]   Fax:  [phone]  Name: CLARE CASTO MRN: [id number] Date of Birth: October 30, 1951

## 2019-12-31 NOTE — Patient Instructions (Signed)
Access Code: [email or web address]  URL: [email or web address]  Date: 12/31/2019  Prepared by: Dorene Grebe   Exercises  Standing Tandem Balance with Unilateral Counter Support - 10 reps - 3 sets - 1x daily - 7x weekly  Standing Single Leg Stance with Counter Support - 10 reps - 3 sets - 1x daily - 7x weekly  Standing Shoulder Diagonal Horizontal Abduction 60/120 Degrees with Resistance - 10 reps - 3 sets - 1x daily - 7x weekly  Standing Shoulder Flexion with Resistance - 10 reps - 3 sets - 1x daily - 7x weekly  Seated Hamstring Stretch - 3 reps - 1 sets - 20 seconds hold - 1x daily - 7x weekly

## 2020-01-02 ENCOUNTER — Ambulatory Visit: Payer: Medicare Other | Admitting: Physical Therapy

## 2020-01-02 ENCOUNTER — Other Ambulatory Visit: Payer: Self-pay

## 2020-01-02 ENCOUNTER — Encounter: Payer: Self-pay | Admitting: Physical Therapy

## 2020-01-02 DIAGNOSIS — M6281 Muscle weakness (generalized): Secondary | ICD-10-CM | POA: Diagnosis not present

## 2020-01-02 DIAGNOSIS — R279 Unspecified lack of coordination: Secondary | ICD-10-CM | POA: Diagnosis not present

## 2020-01-02 DIAGNOSIS — R2689 Other abnormalities of gait and mobility: Secondary | ICD-10-CM | POA: Diagnosis not present

## 2020-01-02 NOTE — Therapy (Signed)
[place] [facility] [address] [place], [place], [address] Phone: [phone]   Fax:  [phone]  Physical Therapy Treatment  Patient Details  Name: Tom Underwood MRN: [id number] Date of Birth: 02-Feb-1951 Referring Provider (PT): Cristopher Peru, MD   Encounter Date: 01/02/2020  PT End of Session - 01/02/20 1217    Visit Number  3    Number of Visits  12    Date for PT Re-Evaluation  02/06/20    Authorization - Visit Number  3    Authorization - Number of Visits  10    PT Start Time  1021    PT Stop Time  1140    PT Time Calculation (min)  79 min    Equipment Utilized During Treatment  Gait belt    Activity Tolerance  Patient tolerated treatment well;Patient limited by fatigue;No increased pain    Behavior During Therapy  WFL for tasks assessed/performed       Past Medical History:  Diagnosis Date  . Decreased libido   . Diverticulitis   . HLD (hyperlipidemia)   . Hydronephrosis with renal and ureteral calculus obstruction   . Hydronephrosis with renal and ureteral calculus obstruction   . Hypogonadism in male   . Rhinitis, allergic   . Sleep apnea   . Ureteral stone     Past Surgical History:  Procedure Laterality Date  . EXTERNAL EAR SURGERY    . FINGER SURGERY    . KNEE SURGERY    . left eye      There were no vitals filed for this visit.  Subjective Assessment - 01/02/20 1215    Subjective  Pt. states that he still feels heaviness in his legs and slight pain in his arms. Pt. is still sleeping in his recliner since that is the only way he is comfortable. Pt. reports that he believed the manual hamstring stretching really helped him last time    Pertinent History  Pt. had a right sided stroke and loves to watch football and travel to the beach with his wife.    Limitations  Walking;Standing    How long can you walk comfortably?  0.2 miles before heaviness/fatigue    Patient Stated Goals  Increase walking distance,  carry things with less pain in shoulder    Currently in Pain?  No/denies    Pain Onset  More than a month ago    Pain Onset  More than a month ago        Therex:  SciFit Bike 10' Seated alternating marching with holding 2 s on the top, bilat. 4 lbs ankle weights 10 reps x 1 set, add RTB above knee 10 reps x 1 set  Seated RTB hip ABD with manual resistance 5 s out 5 s in 10 reps x 2 sets  Seated hamstring stretch 3 x 30" each leg  Neuromuscular re-education:  Standing ball toss and catch with wide BOS/narrowed BOS/staggered stance 10 min, CGA, with verbal cueing for feet placement  Squat with colored ball reaches (10") to box 10 reps x 2 set, verbal cueing for proper techniques  Squat with staggered stance with colored ball reaches to box (5") 10 reps x 1 set each side, verbal cueing for proper techniques  Forward ambulating within blue ladder with cane 5 laps, CGA, with verbal cueing for increasing heel strike and stride length and posture   Manual Therapy Supine bilateral hamstring stretching (proximal) 5 x 30" each  PT Long Term Goals - 12/26/19 1229      PT LONG TERM GOAL #1   Title  Pt. will be able to ambulate with least assistive device for 0.8 mile with good gait mechanics in home community to improve walking endurance.    Baseline  Pt. reports he can walk 0.2 mile in his mobile home community    Time  6    Period  Weeks    Status  New    Target Date  02/06/20      PT LONG TERM GOAL #2   Title  Pt. will improve FOTO score to > 57 to show improvements in LE function for ADLs    Baseline  41    Time  6    Period  Weeks    Status  New    Target Date  02/06/20      PT LONG TERM GOAL #3   Title  Pt. will increase R LE strength to grossly 5/5 to improve gait mechanics and LE strength for ADLs    Baseline  R hip flexion 4-/5, knee flexion 4+/5, ankle everison 4+/5    Time  6    Period  Weeks    Status  New    Target Date  02/06/20      PT LONG TERM GOAL #4    Title  Pt. will increase bilat UE flexion/abduction to 120 deg. for functional mobility for overhead reaching and bathing.    Baseline  R Flexion 79 with pain in deltoid, R Abduction 108; L flexion 131, L Abduction 109    Time  6    Period  Weeks    Status  New    Target Date  02/06/20          Plan - 01/02/20 1217    Clinical Impression Statement  Pt. ambulated into the clinic today with decreased heel strike and stride length with his SPC. Pt. was challenged and had some SOB with the scifit bike. Pt. was able to perform high level balance activities outside of the //-bars with CGA from SPT and no LOB.  Pt. was able to catch and throw a ball with varying widths of his BOS with no loss of balance. Pt. increased his step length to at least 16" with use of SPC,visual/verbal cueing, and CGA from SPT.  Pt. demonstrated increased standing tolerance but experienced some fatigue/pain in his knees.    Personal Factors and Comorbidities  Comorbidity 2    Comorbidities  Hypertension, Obesity    Examination-Activity Limitations  Bed Mobility;Reach Overhead;Squat;Lift    Examination-Participation Restrictions  Community Activity;Yard Work    Conservation officer, historic buildings  Evolving/Moderate complexity    Clinical Decision Making  Moderate    Rehab Potential  Good    PT Frequency  2x / week    PT Duration  6 weeks    PT Treatment/Interventions  ADLs/Self Care Home Management;Gait training;Stair training;Functional mobility training;Therapeutic activities;Therapeutic exercise;Balance training;Neuromuscular re-education;Manual techniques;Patient/family education    PT Next Visit Plan  6 min walk test, Ball tossing, cone tapping, star balance    Consulted and Agree with Plan of Care  Patient       Patient will benefit from skilled therapeutic intervention in order to improve the following deficits and impairments:  Abnormal gait, Decreased activity tolerance, Decreased balance, Decreased  coordination, Decreased mobility, Decreased endurance, Decreased range of motion, Decreased strength, Difficulty walking, Dizziness, Impaired UE functional use, Pain  Visit Diagnosis: Muscle weakness (generalized)  Other abnormalities of gait and mobility  Unspecified lack of coordination     Problem List Patient Active Problem List   Diagnosis Date Noted  . Hemiparesis affecting right side as late effect of cerebrovascular accident ([place]) 08/13/2019  . Other sleep apnea 08/06/2019  . History of kidney stones 05/02/2018  . ED (erectile dysfunction) 05/02/2018  . Hyperglycemia 01/30/2017  . Arthritis of knee, degenerative 01/30/2017  . BPH with obstruction/lower urinary tract symptoms 02/03/2016  . Gross hematuria 02/03/2016  . Dyslipidemia 08/04/2015  . Skin lesions 07/14/2015  . Diverticulitis of large intestine without perforation or abscess without bleeding 06/30/2015  . Edema 06/30/2015  . OSA on CPAP 06/30/2015  . Kidney pain 06/30/2015   Pura Spice, PT, DPT # [id number] Andrey Campanile, SPT 01/02/2020, 1:15 PM  [place] [facility] [address] [place], [place], [address] Phone: [phone]   Fax:  [phone]  Name: Tom Underwood MRN: [id number] Date of Birth: 03-27-1951

## 2020-01-07 ENCOUNTER — Ambulatory Visit: Payer: Medicare Other | Admitting: Physical Therapy

## 2020-01-08 ENCOUNTER — Other Ambulatory Visit: Payer: Self-pay

## 2020-01-08 ENCOUNTER — Encounter: Payer: Self-pay | Admitting: Physical Therapy

## 2020-01-08 ENCOUNTER — Ambulatory Visit: Payer: Medicare Other | Attending: Neurology | Admitting: Physical Therapy

## 2020-01-08 DIAGNOSIS — R279 Unspecified lack of coordination: Secondary | ICD-10-CM

## 2020-01-08 DIAGNOSIS — M6281 Muscle weakness (generalized): Secondary | ICD-10-CM

## 2020-01-08 DIAGNOSIS — R2689 Other abnormalities of gait and mobility: Secondary | ICD-10-CM | POA: Diagnosis not present

## 2020-01-08 NOTE — Therapy (Signed)
[place] [facility] [address] [place], [place], [address] Phone: [phone]   Fax:  [phone]  Physical Therapy Treatment  Patient Details  Name: Tom Underwood MRN: [id number] Date of Birth: [birth year] Referring Provider (PT): Cristopher Peru, MD   Encounter Date: 01/08/2020  PT End of Session - 01/08/20 1143    Visit Number  4    Number of Visits  12    Date for PT Re-Evaluation  02/06/20    Authorization - Visit Number  4    Authorization - Number of Visits  10    PT Start Time  1031    PT Stop Time  1148    PT Time Calculation (min)  77 min    Equipment Utilized During Treatment  Gait belt    Activity Tolerance  Patient tolerated treatment well;Patient limited by fatigue;Patient limited by pain    Behavior During Therapy  [facility] for tasks assessed/performed       Past Medical History:  Diagnosis Date  . Decreased libido   . Diverticulitis   . HLD (hyperlipidemia)   . Hydronephrosis with renal and ureteral calculus obstruction   . Hydronephrosis with renal and ureteral calculus obstruction   . Hypogonadism in male   . Rhinitis, allergic   . Sleep apnea   . Ureteral stone     Past Surgical History:  Procedure Laterality Date  . EXTERNAL EAR SURGERY    . FINGER SURGERY    . KNEE SURGERY    . left eye      There were no vitals filed for this visit.  Subjective Assessment - 01/08/20 1205    Subjective  Pt. states that he was sore for two days after treatment last friday in his R shoulder and R knee. Pt. states that his R UE/LE both feel heavy and has trouble getting the R LE into the car. Pt. believes the ball tossing above head aggravated his R shoulder.    Pertinent History  Pt. had a right sided stroke and loves to watch football and travel to the beach with his wife.    Limitations  Walking;Standing    How long can you walk comfortably?  0.2 miles before heaviness/fatigue    Patient Stated Goals  Increase walking  distance, carry things with less pain in shoulder    Currently in Pain?  No/denies    Pain Onset  More than a month ago    Pain Onset  More than a month ago       TheraEx  NuStep 10' level 4  Seated ball toss with rebounder 2 x 20   Neuromuscular Re-Education Gait training in hallway for increased heel strike and cadence x 3 Gait training in hallway with head turning x 2  Tandem walking forward/ backward on blue airex balance beam in //-bars with unilateral UE support x 4 Side walking on blue airex balance beam in //-bars  With unilateral UE support x 8 Walking over hurdles in //-bars with little to no UE support  Stair climbing with step over step pattern x 3 with unilateral UE support Standing foot taps to 1st and 2nd step with no UE support 20x each alternating (cueing for decreased cadence to challenge balance)    PT Long Term Goals - 12/26/19 1229      PT LONG TERM GOAL #1   Title  Pt. will be able to ambulate with least assistive device for 0.8 mile with good gait mechanics  in home community to improve walking endurance.    Baseline  Pt. reports he can walk 0.2 mile in his mobile home community    Time  6    Period  Weeks    Status  New    Target Date  02/06/20      PT LONG TERM GOAL #2   Title  Pt. will improve FOTO score to > 57 to show improvements in LE function for ADLs    Baseline  41    Time  6    Period  Weeks    Status  New    Target Date  02/06/20      PT LONG TERM GOAL #3   Title  Pt. will increase R LE strength to grossly 5/5 to improve gait mechanics and LE strength for ADLs    Baseline  R hip flexion 4-/5, knee flexion 4+/5, ankle everison 4+/5    Time  6    Period  Weeks    Status  New    Target Date  02/06/20      PT LONG TERM GOAL #4   Title  Pt. will increase bilat UE flexion/abduction to 120 deg. for functional mobility for overhead reaching and bathing.    Baseline  R Flexion 79 with pain in deltoid, R Abduction 108; L flexion 131, L  Abduction 109    Time  6    Period  Weeks    Status  New    Target Date  02/06/20            Plan - 01/08/20 1207    Clinical Impression Statement  Pt. was able to decrease UE support with tandem and hurdle balance activities today in //-bars. Pt. abducted bilateral UE to maintain balance with step touches, hurdles, and tandem stance walking when challenged to use no UE support. Pt. showed improvements in stride length and step cadence during walking in the hallway with SPT. Pt. ambulates with decreased step length with cane when walking alone and into the clinic. Pt. demonstrated increased hip flexion without circumduction of R LE with high hurdles in bilateral LE with occasional touching of the high hurdle.    Personal Factors and Comorbidities  Comorbidity 2    Comorbidities  Hypertension, Obesity    Examination-Activity Limitations  Bed Mobility;Reach Overhead;Squat;Lift    Examination-Participation Restrictions  Community Activity;Yard Work    Conservation officer, historic buildings  Evolving/Moderate complexity    Clinical Decision Making  Moderate    Rehab Potential  Good    PT Frequency  2x / week    PT Duration  6 weeks    PT Treatment/Interventions  ADLs/Self Care Home Management;Gait training;Stair training;Functional mobility training;Therapeutic activities;Therapeutic exercise;Balance training;Neuromuscular re-education;Manual techniques;Patient/family education    PT Next Visit Plan  Ball tossing, cone tapping, star balance    Consulted and Agree with Plan of Care  Patient       Patient will benefit from skilled therapeutic intervention in order to improve the following deficits and impairments:  Abnormal gait, Decreased activity tolerance, Decreased balance, Decreased coordination, Decreased mobility, Decreased endurance, Decreased range of motion, Decreased strength, Difficulty walking, Dizziness, Impaired UE functional use, Pain  Visit Diagnosis: Muscle weakness  (generalized)  Other abnormalities of gait and mobility  Unspecified lack of coordination     Problem List Patient Active Problem List   Diagnosis Date Noted  . Hemiparesis affecting right side as late effect of cerebrovascular accident (HCC) 08/13/2019  . Other sleep apnea 08/06/2019  .  History of kidney stones 05/02/2018  . ED (erectile dysfunction) 05/02/2018  . Hyperglycemia 01/30/2017  . Arthritis of knee, degenerative 01/30/2017  . BPH with obstruction/lower urinary tract symptoms 02/03/2016  . Gross hematuria 02/03/2016  . Dyslipidemia 08/04/2015  . Skin lesions 07/14/2015  . Diverticulitis of large intestine without perforation or abscess without bleeding 06/30/2015  . Edema 06/30/2015  . OSA on CPAP 06/30/2015  . Kidney pain 06/30/2015   Pura Spice, PT, DPT # [id number] Andrey Campanile, SPT 01/08/2020, 1:19 PM  [place] [facility] [address] [place], [place], [address] Phone: [phone]   Fax:  [phone]  Name: Tom Underwood MRN: [id number] Date of Birth: July 27, 1951

## 2020-01-09 ENCOUNTER — Ambulatory Visit: Payer: Medicare Other | Admitting: Physical Therapy

## 2020-01-10 ENCOUNTER — Encounter: Payer: Self-pay | Admitting: Physical Therapy

## 2020-01-10 ENCOUNTER — Ambulatory Visit: Payer: Medicare Other | Admitting: Physical Therapy

## 2020-01-10 DIAGNOSIS — R279 Unspecified lack of coordination: Secondary | ICD-10-CM | POA: Diagnosis not present

## 2020-01-10 DIAGNOSIS — R2689 Other abnormalities of gait and mobility: Secondary | ICD-10-CM | POA: Diagnosis not present

## 2020-01-10 DIAGNOSIS — M6281 Muscle weakness (generalized): Secondary | ICD-10-CM

## 2020-01-10 NOTE — Therapy (Signed)
[facility] [address] [place], [place], [address] Phone: [phone]   Fax:  [phone]  Physical Therapy Treatment  Patient Details  Name: Tom Underwood MRN: [id number] Date of Birth: 1950/12/12 Referring Provider (PT): Jennings Books, MD   Encounter Date: 01/10/2020  PT End of Session - 01/10/20 1333    Visit Number  5    Number of Visits  12    Date for PT Re-Evaluation  02/06/20    Authorization - Visit Number  5    Authorization - Number of Visits  10    PT Start Time  3536    PT Stop Time  1443    PT Time Calculation (min)  72 min    Equipment Utilized During Treatment  Gait belt    Activity Tolerance  Patient tolerated treatment well;Patient limited by fatigue;Patient limited by pain    Behavior During Therapy  [facility] for tasks assessed/performed       Past Medical History:  Diagnosis Date  . Decreased libido   . Diverticulitis   . HLD (hyperlipidemia)   . Hydronephrosis with renal and ureteral calculus obstruction   . Hydronephrosis with renal and ureteral calculus obstruction   . Hypogonadism in male   . Rhinitis, allergic   . Sleep apnea   . Ureteral stone     Past Surgical History:  Procedure Laterality Date  . EXTERNAL EAR SURGERY    . FINGER SURGERY    . KNEE SURGERY    . left eye      There were no vitals filed for this visit.  Subjective Assessment - 01/10/20 1352    Subjective  Pt. states that his arm feels like a lead pipe when he raises it above 90 degs and rates the pain at a 5/10 in the R shoulder. Pt. reports some stiffness in his R knee and rates his pain when he first gets up out of the chair as a 6/10 pain.    Pertinent History  Pt. had a right sided stroke and loves to watch football and travel to the beach with his wife.    Limitations  Walking;Standing    How long can you walk comfortably?  0.2 miles before heaviness/fatigue    Patient Stated Goals  Increase walking distance, carry  things with less pain in shoulder    Currently in Pain?  Yes    Pain Score  5     Pain Location  Shoulder    Pain Orientation  Right;Anterior    Pain Descriptors / Indicators  Heaviness    Pain Type  Chronic pain    Pain Onset  More than a month ago    Multiple Pain Sites  Yes    Pain Score  6    Pain Location  Knee    Pain Orientation  Right    Pain Descriptors / Indicators  Heaviness;Other (Comment)   Stiffness   Pain Type  Chronic pain    Pain Onset  More than a month ago       TheraEx NuStep 10' level 2   Neuromuscular Re-Education Hurdle walking in the //-bars (two high hurdles followed by three shorter ones) 10 x Airex balance beam walking forward/backward and side ways with head turns 5 x each  Star balance standing x 5  Bilaterally with CGA Ball toss with tandem stance 2.5 min on each side with CGA  Gait Training in hallway x 3 with CGA (verbal cueing  for increased R arm swing during gait)    PT Long Term Goals - 12/26/19 1229      PT LONG TERM GOAL #1   Title  Pt. will be able to ambulate with least assistive device for 0.8 mile with good gait mechanics in home community to improve walking endurance.    Baseline  Pt. reports he can walk 0.2 mile in his mobile home community    Time  6    Period  Weeks    Status  New    Target Date  02/06/20      PT LONG TERM GOAL #2   Title  Pt. will improve FOTO score to > 57 to show improvements in LE function for ADLs    Baseline  41    Time  6    Period  Weeks    Status  New    Target Date  02/06/20      PT LONG TERM GOAL #3   Title  Pt. will increase R LE strength to grossly 5/5 to improve gait mechanics and LE strength for ADLs    Baseline  R hip flexion 4-/5, knee flexion 4+/5, ankle everison 4+/5    Time  6    Period  Weeks    Status  New    Target Date  02/06/20      PT LONG TERM GOAL #4   Title  Pt. will increase bilat UE flexion/abduction to 120 deg. for functional mobility for overhead reaching and  bathing.    Baseline  R Flexion 79 with pain in deltoid, R Abduction 108; L flexion 131, L Abduction 109    Time  6    Period  Weeks    Status  New    Target Date  02/06/20            Plan - 01/10/20 1506    Clinical Impression Statement  Pt. was able to demonstrate increased bilateral hip flexion with hurdle walking with two consecutive high hurdles. Pt. ambulates with decreased R UE arm swing with gait and requires verbal cueing to relax R UE and allow for natual arm swing. Pt. had decreasedrighting reaction time when standing on the R LE with star balance activity and SPT provided maximal assistance with the gait belt to prevent fall. Pt. was able to side walk on the foam with head turns with bilateral UE support and no LOB in the //-bars. Pt. occasionally scuffs feet on the floor with gait training and can benefit for increased plantar/dorsiflexors in the foot to increase heel strike.    Personal Factors and Comorbidities  Comorbidity 2    Comorbidities  Hypertension, Obesity    Examination-Activity Limitations  Bed Mobility;Reach Overhead;Squat;Lift    Examination-Participation Restrictions  Community Activity;Yard Work    Conservation officer, historic buildings  Evolving/Moderate complexity    Rehab Potential  Good    PT Frequency  2x / week    PT Duration  6 weeks    PT Treatment/Interventions  ADLs/Self Care Home Management;Gait training;Stair training;Functional mobility training;Therapeutic activities;Therapeutic exercise;Balance training;Neuromuscular re-education;Manual techniques;Patient/family education    PT Next Visit Plan  Ball tossing, cone tapping, star balance    Consulted and Agree with Plan of Care  Patient       Patient will benefit from skilled therapeutic intervention in order to improve the following deficits and impairments:  Abnormal gait, Decreased activity tolerance, Decreased balance, Decreased coordination, Decreased mobility, Decreased endurance, Decreased  range of motion, Decreased strength,  Difficulty walking, Dizziness, Impaired UE functional use, Pain  Visit Diagnosis: Other abnormalities of gait and mobility  Unspecified lack of coordination  Muscle weakness (generalized)     Problem List Patient Active Problem List   Diagnosis Date Noted  . Hemiparesis affecting right side as late effect of cerebrovascular accident (HCC) 08/13/2019  . Other sleep apnea 08/06/2019  . History of kidney stones 05/02/2018  . ED (erectile dysfunction) 05/02/2018  . Hyperglycemia 01/30/2017  . Arthritis of knee, degenerative 01/30/2017  . BPH with obstruction/lower urinary tract symptoms 02/03/2016  . Gross hematuria 02/03/2016  . Dyslipidemia 08/04/2015  . Skin lesions 07/14/2015  . Diverticulitis of large intestine without perforation or abscess without bleeding 06/30/2015  . Edema 06/30/2015  . OSA on CPAP 06/30/2015  . Kidney pain 06/30/2015    Cammie Mcgee, PT, DPT # [id number] Verl Blalock, SPT 01/11/2020, 8:47 AM  [place] [facility] [address] [place], [place], [address] Phone: [phone]   Fax:  [phone]  Name: BOLUWATIFE FLIGHT MRN: [id number] Date of Birth: January 30, 1951

## 2020-01-14 ENCOUNTER — Encounter: Payer: Self-pay | Admitting: Physical Therapy

## 2020-01-14 ENCOUNTER — Other Ambulatory Visit: Payer: Self-pay

## 2020-01-14 ENCOUNTER — Ambulatory Visit: Payer: Medicare Other | Admitting: Physical Therapy

## 2020-01-14 DIAGNOSIS — M6281 Muscle weakness (generalized): Secondary | ICD-10-CM

## 2020-01-14 DIAGNOSIS — R279 Unspecified lack of coordination: Secondary | ICD-10-CM

## 2020-01-14 DIAGNOSIS — R2689 Other abnormalities of gait and mobility: Secondary | ICD-10-CM | POA: Diagnosis not present

## 2020-01-14 NOTE — Therapy (Signed)
[place] [facility] [address] [place], [place], [address] Phone: [phone]   Fax:  [phone]  Physical Therapy Treatment  Patient Details  Name: Tom Underwood MRN: [id number] Date of Birth: [birth year] Referring Provider (PT): Cristopher Peru, MD   Encounter Date: 01/14/2020  PT End of Session - 01/14/20 1254    Visit Number  6    Number of Visits  12    Date for PT Re-Evaluation  02/06/20    Authorization - Visit Number  6    Authorization - Number of Visits  10    PT Start Time  [phone]    PT Stop Time  1049    PT Time Calculation (min)  72 min    Equipment Utilized During Treatment  Gait belt    Activity Tolerance  Patient tolerated treatment well;Patient limited by fatigue;Patient limited by pain    Behavior During Therapy  [facility] for tasks assessed/performed       Past Medical History:  Diagnosis Date  . Decreased libido   . Diverticulitis   . HLD (hyperlipidemia)   . Hydronephrosis with renal and ureteral calculus obstruction   . Hydronephrosis with renal and ureteral calculus obstruction   . Hypogonadism in male   . Rhinitis, allergic   . Sleep apnea   . Ureteral stone     Past Surgical History:  Procedure Laterality Date  . EXTERNAL EAR SURGERY    . FINGER SURGERY    . KNEE SURGERY    . left eye      There were no vitals filed for this visit.  Subjective Assessment - 01/14/20 1253    Subjective  Pt. reports no new issues.  Pt. entered PT with use of SPC with reports of no LOB.    Pertinent History  Pt. had a right sided stroke and loves to watch football and travel to the beach with his wife.    Limitations  Walking;Standing    How long can you walk comfortably?  0.2 miles before heaviness/fatigue    Patient Stated Goals  Increase walking distance, carry things with less pain in shoulder    Currently in Pain?  No/denies    Pain Onset  More than a month ago    Pain Onset  More than a month ago        Neuromuscular Re-Education High knee marching in //-bars with no UE support for balance x 5 laps Side stepping in //-bars with no UE support for balance x 5 laps  High step touches in //-bars to improve SLS and to improve hip flexor/dorsiflexor coordination 2 x 20 alternating  Cone taps while standing on R LE in //-bars 3 cones x 10 SLS in //-bars alternating with no UE support (verbal cueing to relax bilat. Arms) x 20 (pt. Can hold position for less than 3 seconds) SLS in //-bars with contralateral arm raises x 20  (pt. Can hold position for less than 3 seconds)  Gait activities in the hallway with SPC to encourage increased R arm swing and heel strike x 4 laps  TheraEx (no charge) NuStep Level 4 10'     PT Long Term Goals - 12/26/19 1229      PT LONG TERM GOAL #1   Title  Pt. will be able to ambulate with least assistive device for 0.8 mile with good gait mechanics in home community to improve walking endurance.    Baseline  Pt. reports he can walk 0.2  mile in his mobile home community    Time  6    Period  Weeks    Status  New    Target Date  02/06/20      PT LONG TERM GOAL #2   Title  Pt. will improve FOTO score to > 57 to show improvements in LE function for ADLs    Baseline  41    Time  6    Period  Weeks    Status  New    Target Date  02/06/20      PT LONG TERM GOAL #3   Title  Pt. will increase R LE strength to grossly 5/5 to improve gait mechanics and LE strength for ADLs    Baseline  R hip flexion 4-/5, knee flexion 4+/5, ankle everison 4+/5    Time  6    Period  Weeks    Status  New    Target Date  02/06/20      PT LONG TERM GOAL #4   Title  Pt. will increase bilat UE flexion/abduction to 120 deg. for functional mobility for overhead reaching and bathing.    Baseline  R Flexion 79 with pain in deltoid, R Abduction 108; L flexion 131, L Abduction 109    Time  6    Period  Weeks    Status  New    Target Date  02/06/20            Plan - 01/14/20  1255    Clinical Impression Statement  Pt. was able to improve single leg balance in the //-bars with decreased UE support. Pt. had improved coordination with raising contralateral arm and leg in //-bars and hold for >3 seconds. Pt. continues to use shoulder abduction for balance with increased R UT involvement. During gait training, pt. demonstrated improved arm swing and heel strike but continues to have decreased step length and equal stride length. Pt. has increased R external rotation of the LE with gait activites. Pt. will benefit from continued dynamic balance activities and cardiovascular endurance to improve ADL/IADL independence.    Personal Factors and Comorbidities  Comorbidity 2    Comorbidities  Hypertension, Obesity    Examination-Activity Limitations  Bed Mobility;Reach Overhead;Squat;Lift    Examination-Participation Restrictions  Community Activity;Yard Work    Merchant navy officer  Evolving/Moderate complexity    Clinical Decision Making  Moderate    Rehab Potential  Good    PT Frequency  2x / week    PT Duration  6 weeks    PT Treatment/Interventions  ADLs/Self Care Home Management;Gait training;Stair training;Functional mobility training;Therapeutic activities;Therapeutic exercise;Balance training;Neuromuscular re-education;Manual techniques;Patient/family education    PT Next Visit Plan  Ball tossing, cone tapping, star balance    Consulted and Agree with Plan of Care  Patient       Patient will benefit from skilled therapeutic intervention in order to improve the following deficits and impairments:  Abnormal gait, Decreased activity tolerance, Decreased balance, Decreased coordination, Decreased mobility, Decreased endurance, Decreased range of motion, Decreased strength, Difficulty walking, Dizziness, Impaired UE functional use, Pain  Visit Diagnosis: Other abnormalities of gait and mobility  Unspecified lack of coordination  Muscle weakness  (generalized)     Problem List Patient Active Problem List   Diagnosis Date Noted  . Hemiparesis affecting right side as late effect of cerebrovascular accident ([place]) 08/13/2019  . Other sleep apnea 08/06/2019  . History of kidney stones 05/02/2018  . ED (erectile dysfunction) 05/02/2018  . Hyperglycemia  01/30/2017  . Arthritis of knee, degenerative 01/30/2017  . BPH with obstruction/lower urinary tract symptoms 02/03/2016  . Gross hematuria 02/03/2016  . Dyslipidemia 08/04/2015  . Skin lesions 07/14/2015  . Diverticulitis of large intestine without perforation or abscess without bleeding 06/30/2015  . Edema 06/30/2015  . OSA on CPAP 06/30/2015  . Kidney pain 06/30/2015    Cammie Mcgee, PT, DPT # [id number] Verl Blalock, SPT 01/15/2020, 9:24 AM  [place] [facility] [address] [place], [place], [address] Phone: [phone]   Fax:  [phone]  Name: Tom Underwood MRN: [id number] Date of Birth: [birth year]

## 2020-01-16 ENCOUNTER — Ambulatory Visit: Payer: Medicare Other | Admitting: Physical Therapy

## 2020-01-16 ENCOUNTER — Other Ambulatory Visit: Payer: Self-pay

## 2020-01-16 ENCOUNTER — Encounter: Payer: Self-pay | Admitting: Physical Therapy

## 2020-01-16 DIAGNOSIS — R2689 Other abnormalities of gait and mobility: Secondary | ICD-10-CM

## 2020-01-16 DIAGNOSIS — M6281 Muscle weakness (generalized): Secondary | ICD-10-CM | POA: Diagnosis not present

## 2020-01-16 DIAGNOSIS — R279 Unspecified lack of coordination: Secondary | ICD-10-CM | POA: Diagnosis not present

## 2020-01-16 NOTE — Therapy (Signed)
[facility] [address] [place], [place], [address] Phone: [phone]   Fax:  [phone]  Physical Therapy Treatment  Patient Details  Name: Tom Underwood MRN: [id number] Date of Birth: March 01, 1951 Referring Provider (PT): Cristopher Peru, MD   Encounter Date: 01/16/2020  PT End of Session - 01/16/20 1144    Visit Number  7    Number of Visits  12    Date for PT Re-Evaluation  02/06/20    Authorization - Visit Number  7    Authorization - Number of Visits  10    PT Start Time  [phone]    PT Stop Time  1053    PT Time Calculation (min)  75 min    Equipment Utilized During Treatment  Gait belt    Activity Tolerance  Patient tolerated treatment well;Patient limited by fatigue;Patient limited by pain    Behavior During Therapy  [facility] for tasks assessed/performed       Past Medical History:  Diagnosis Date  . Decreased libido   . Diverticulitis   . HLD (hyperlipidemia)   . Hydronephrosis with renal and ureteral calculus obstruction   . Hydronephrosis with renal and ureteral calculus obstruction   . Hypogonadism in male   . Rhinitis, allergic   . Sleep apnea   . Ureteral stone     Past Surgical History:  Procedure Laterality Date  . EXTERNAL EAR SURGERY    . FINGER SURGERY    . KNEE SURGERY    . left eye      There were no vitals filed for this visit.  Subjective Assessment - 01/16/20 1127    Subjective  Pt. reports that he feels stiff when he is sitting for prolonged period of times and he takes a few weight shifts to feel better after rising from a chair. Pt. still reports heaviness in the R arm    Pertinent History  Pt. had a right sided stroke and loves to watch football and travel to the beach with his wife.    Limitations  Walking;Standing    How long can you walk comfortably?  0.2 miles before heaviness/fatigue    Patient Stated Goals  Increase walking distance, carry things with less pain in shoulder    Currently  in Pain?  No/denies    Pain Location  Shoulder    Pain Orientation  Right;Anterior    Pain Descriptors / Indicators  Heaviness    Pain Onset  More than a month ago    Multiple Pain Sites  No    Pain Onset  More than a month ago       TheraEx NuStep 10' Level 4 SPM >80 Standing overhead reaching/endurance with graded clips to 86" shelf height x 8 alternating arms Squat with reach to corners of box with rainbow ball with double leg stance x 20 Squat with reach to corners of box with rainbow ball with staggered stance x 20 bilaterally Seated I, Y, Ts with 1# bilaterally (verbal cueing for increased scapular retraction and decreased UT involvement)   Neuromuscular Re-Education Gait training for increased step length with blue ladder for visual cue with SPC x 6  minutes Stair climbing with alternating pattern ascending with the L leg and step to pattern descending with R leg, bilateral UE support x 8  Walking over hurdles (low, high, low, high) in //-bars x 8    PT Long Term Goals - 12/26/19 1229      PT  LONG TERM GOAL #1   Title  Pt. will be able to ambulate with least assistive device for 0.8 mile with good gait mechanics in home community to improve walking endurance.    Baseline  Pt. reports he can walk 0.2 mile in his mobile home community    Time  6    Period  Weeks    Status  New    Target Date  02/06/20      PT LONG TERM GOAL #2   Title  Pt. will improve FOTO score to > 57 to show improvements in LE function for ADLs    Baseline  41    Time  6    Period  Weeks    Status  New    Target Date  02/06/20      PT LONG TERM GOAL #3   Title  Pt. will increase R LE strength to grossly 5/5 to improve gait mechanics and LE strength for ADLs    Baseline  R hip flexion 4-/5, knee flexion 4+/5, ankle everison 4+/5    Time  6    Period  Weeks    Status  New    Target Date  02/06/20      PT LONG TERM GOAL #4   Title  Pt. will increase bilat UE flexion/abduction to 120 deg. for  functional mobility for overhead reaching and bathing.    Baseline  R Flexion 79 with pain in deltoid, R Abduction 108; L flexion 131, L Abduction 109    Time  6    Period  Weeks    Status  New    Target Date  02/06/20            Plan - 01/16/20 1146    Clinical Impression Statement  Pt. was able to complete standing overhead reaching to 86" with graded pins demonstrating increased bilateral UE enduracne. Pt. required verbal cueing to decrease bilateral UT involvement with seated I,Y, Ts. Pt. continues to ambulate with decreased stride and step length with a SPC when in the community but demonstrates increased step and stride length in the clinic to >16". Pt. was able to stair climbing using and alternating step pattern ascending and uses a step to patten when descending. Pt. was educated on how to properly climb stairs with the [facility] using the R LE to ascend and the L LE to descend. Pt. was able to improve hip flexion without circumduction with high hurdles in the //-bars and utilized decreased UE support and decreased shoulder abduction for balance.    Personal Factors and Comorbidities  Comorbidity 2    Comorbidities  Hypertension, Obesity    Examination-Activity Limitations  Bed Mobility;Reach Overhead;Squat;Lift    Examination-Participation Restrictions  Community Activity;Yard Work    Merchant [employer] officer  Evolving/Moderate complexity    Clinical Decision Making  Moderate    Rehab Potential  Good    PT Frequency  2x / week    PT Duration  6 weeks    PT Treatment/Interventions  ADLs/Self Care Home Management;Gait training;Stair training;Functional mobility training;Therapeutic activities;Therapeutic exercise;Balance training;Neuromuscular re-education;Manual techniques;Patient/family education    PT Next Visit Plan  High stepping, SLS and Star Balance in //-bars    Consulted and Agree with Plan of Care  Patient       Patient will benefit from skilled therapeutic  intervention in order to improve the following deficits and impairments:  Abnormal gait, Decreased activity tolerance, Decreased balance, Decreased coordination, Decreased mobility, Decreased endurance, Decreased range  of motion, Decreased strength, Difficulty walking, Dizziness, Impaired UE functional use, Pain  Visit Diagnosis: Other abnormalities of gait and mobility  Unspecified lack of coordination  Muscle weakness (generalized)     Problem List Patient Active Problem List   Diagnosis Date Noted  . Hemiparesis affecting right side as late effect of cerebrovascular accident (HCC) 08/13/2019  . Other sleep apnea 08/06/2019  . History of kidney stones 05/02/2018  . ED (erectile dysfunction) 05/02/2018  . Hyperglycemia 01/30/2017  . Arthritis of knee, degenerative 01/30/2017  . BPH with obstruction/lower urinary tract symptoms 02/03/2016  . Gross hematuria 02/03/2016  . Dyslipidemia 08/04/2015  . Skin lesions 07/14/2015  . Diverticulitis of large intestine without perforation or abscess without bleeding 06/30/2015  . Edema 06/30/2015  . OSA on CPAP 06/30/2015  . Kidney pain 06/30/2015    Cammie Mcgee, PT, DPT # [id number] Verl Blalock, SPT 01/17/2020, 9:57 AM  [place] [facility] [address] [place], [place], [address] Phone: [phone]   Fax:  [phone]  Name: Tom Underwood MRN: [id number] Date of Birth: 14-Sep-1951

## 2020-01-17 DIAGNOSIS — Z9989 Dependence on other enabling machines and devices: Secondary | ICD-10-CM | POA: Diagnosis not present

## 2020-01-17 DIAGNOSIS — G4733 Obstructive sleep apnea (adult) (pediatric): Secondary | ICD-10-CM | POA: Diagnosis not present

## 2020-01-21 ENCOUNTER — Ambulatory Visit: Payer: Medicare Other | Admitting: Physical Therapy

## 2020-01-21 ENCOUNTER — Other Ambulatory Visit: Payer: Self-pay

## 2020-01-21 DIAGNOSIS — R2689 Other abnormalities of gait and mobility: Secondary | ICD-10-CM | POA: Diagnosis not present

## 2020-01-21 DIAGNOSIS — R279 Unspecified lack of coordination: Secondary | ICD-10-CM

## 2020-01-21 DIAGNOSIS — M6281 Muscle weakness (generalized): Secondary | ICD-10-CM | POA: Diagnosis not present

## 2020-01-21 DIAGNOSIS — Z23 Encounter for immunization: Secondary | ICD-10-CM | POA: Diagnosis not present

## 2020-01-21 NOTE — Therapy (Signed)
[facility] [address]. [place], [place], [address] Phone: [phone]   Fax:  [phone]  Physical Therapy Treatment  Patient Details  Name: Tom Underwood MRN: [id number] Date of Birth: 1951-08-28 Referring Provider (PT): Cristopher Peru, MD   Encounter Date: 01/21/2020  PT End of Session - 01/21/20 1432    Visit Number  8    Number of Visits  12    Date for PT Re-Evaluation  02/06/20    Authorization - Visit Number  8    Authorization - Number of Visits  10    PT Start Time  1113    PT Stop Time  1217    PT Time Calculation (min)  64 min    Equipment Utilized During Treatment  Gait belt    Activity Tolerance  Patient tolerated treatment well;No increased pain    Behavior During Therapy  WFL for tasks assessed/performed       Past Medical History:  Diagnosis Date  . Decreased libido   . Diverticulitis   . HLD (hyperlipidemia)   . Hydronephrosis with renal and ureteral calculus obstruction   . Hydronephrosis with renal and ureteral calculus obstruction   . Hypogonadism in male   . Rhinitis, allergic   . Sleep apnea   . Ureteral stone     Past Surgical History:  Procedure Laterality Date  . EXTERNAL EAR SURGERY    . FINGER SURGERY    . KNEE SURGERY    . left eye      There were no vitals filed for this visit.  Subjective Assessment - 01/21/20 1430    Subjective  Pt. reports sleeping on his R shoulder wrong and has some discomfort along the clavicle. Pt. reports sleeping in his recliner with an airplane pillow around his neck.    Pertinent History  Pt. had a right sided stroke and loves to watch football and travel to the beach with his wife.    Limitations  Walking;Standing    How long can you walk comfortably?  0.2 miles before heaviness/fatigue    Patient Stated Goals  Increase walking distance, carry things with less pain in shoulder    Currently in Pain?  No/denies    Pain Onset  More than a month ago     Multiple Pain Sites  No    Pain Onset  More than a month ago       TheraEX Nustep Level 4 10' (.48 mile) Seated hip flexion with 3 second holds with 4# ankle weights 2 x 10  Seated contralateral hip flexion (4# ankle weights) and shoulder flexion while seated on wobble pad 2 x 10 alternating holding for 3 seconds Seated LAQ with 4# ankle weights seated on wobble pad 2 x 10 holding for 3 seconds  Neuromuscular Re-education Standing on airex pad for 5 seconds, step off and over 3 consecutive low hurdles x 8 (no UE support) Standing star balance with cones with //-bar support (1 UE) x 8 each leg (pt. Was encouraged to perform exercise slowly to increase SLS time) Standing tandem walking forward and backwards x 2 with bilateral UE support    PT Long Term Goals - 12/26/19 1229      PT LONG TERM GOAL #1   Title  Pt. will be able to ambulate with least assistive device for 0.8 mile with good gait mechanics in home community to improve walking endurance.    Baseline  Pt. reports he can walk  0.2 mile in his mobile home community    Time  6    Period  Weeks    Status  New    Target Date  02/06/20      PT LONG TERM GOAL #2   Title  Pt. will improve FOTO score to > 57 to show improvements in LE function for ADLs    Baseline  41    Time  6    Period  Weeks    Status  New    Target Date  02/06/20      PT LONG TERM GOAL #3   Title  Pt. will increase R LE strength to grossly 5/5 to improve gait mechanics and LE strength for ADLs    Baseline  R hip flexion 4-/5, knee flexion 4+/5, ankle everison 4+/5    Time  6    Period  Weeks    Status  New    Target Date  02/06/20      PT LONG TERM GOAL #4   Title  Pt. will increase bilat UE flexion/abduction to 120 deg. for functional mobility for overhead reaching and bathing.    Baseline  R Flexion 79 with pain in deltoid, R Abduction 108; L flexion 131, L Abduction 109    Time  6    Period  Weeks    Status  New    Target Date  02/06/20             Plan - 01/21/20 1433    Clinical Impression Statement  Pt. was able to increase resistance on the NuStep and travel the same distance as before (.48 mile) in 10 minutes. Pt. was able to complete contralateral UE/LE lifts while maintaining upright posture while sitting on wobble pad. Pt. was able to maintain hip flexion and arm flexion for 3 seconds with 4# ankle weights with good eccentric control. Pt. was able to complete star balance activity with 1 UE support bilaterally with decreased LOB. Pt. had some difficulty moving from standing on the airex pad to stepping down and over a hurdle. Pt. required UE support for tandem walking in the //-bars to maintain balance.    Personal Factors and Comorbidities  Comorbidity 2    Comorbidities  Hypertension, Obesity    Examination-Activity Limitations  Bed Mobility;Reach Overhead;Squat;Lift    Examination-Participation Restrictions  Community Activity;Yard Work    Conservation officer, historic buildings  Evolving/Moderate complexity    Rehab Potential  Good    PT Frequency  2x / week    PT Duration  6 weeks    PT Treatment/Interventions  ADLs/Self Care Home Management;Gait training;Stair training;Functional mobility training;Therapeutic activities;Therapeutic exercise;Balance training;Neuromuscular re-education;Manual techniques;Patient/family education    PT Next Visit Plan  Star Balance in //-bars, 6 min walk test    Consulted and Agree with Plan of Care  Patient       Patient will benefit from skilled therapeutic intervention in order to improve the following deficits and impairments:  Abnormal gait, Decreased activity tolerance, Decreased balance, Decreased coordination, Decreased mobility, Decreased endurance, Decreased range of motion, Decreased strength, Difficulty walking, Dizziness, Impaired UE functional use, Pain  Visit Diagnosis: Other abnormalities of gait and mobility  Unspecified lack of coordination  Muscle weakness  (generalized)     Problem List Patient Active Problem List   Diagnosis Date Noted  . Hemiparesis affecting right side as late effect of cerebrovascular accident (HCC) 08/13/2019  . Other sleep apnea 08/06/2019  . History of kidney stones 05/02/2018  .  ED (erectile dysfunction) 05/02/2018  . Hyperglycemia 01/30/2017  . Arthritis of knee, degenerative 01/30/2017  . BPH with obstruction/lower urinary tract symptoms 02/03/2016  . Gross hematuria 02/03/2016  . Dyslipidemia 08/04/2015  . Skin lesions 07/14/2015  . Diverticulitis of large intestine without perforation or abscess without bleeding 06/30/2015  . Edema 06/30/2015  . OSA on CPAP 06/30/2015  . Kidney pain 06/30/2015   Pura Spice, PT, DPT # [id number] Andrey Campanile, SPT 01/21/2020, 2:40 PM  [facility] [address] [place], [place], [address] Phone: [phone]   Fax:  [phone]  Name: Tom Underwood MRN: [id number] Date of Birth: 09/17/1951

## 2020-01-23 ENCOUNTER — Ambulatory Visit: Payer: Medicare Other | Admitting: Physical Therapy

## 2020-01-24 ENCOUNTER — Ambulatory Visit: Payer: Medicare Other | Admitting: Physical Therapy

## 2020-01-24 DIAGNOSIS — R2689 Other abnormalities of gait and mobility: Secondary | ICD-10-CM

## 2020-01-24 DIAGNOSIS — M6281 Muscle weakness (generalized): Secondary | ICD-10-CM

## 2020-01-24 DIAGNOSIS — R279 Unspecified lack of coordination: Secondary | ICD-10-CM

## 2020-01-24 NOTE — Therapy (Signed)
[facility] [address]. [name], [place], [address] Phone: [phone]   Fax:  [phone]  Physical Therapy Treatment  Patient Details  Name: Tom Underwood MRN: [id number] Date of Birth: 12-30-1950 Referring Provider (PT): Cristopher Peru, MD   Encounter Date: 01/24/2020  PT End of Session - 01/24/20 1509    Visit Number  9    Number of Visits  12    Date for PT Re-Evaluation  02/06/20    Authorization - Visit Number  9    Authorization - Number of Visits  10    PT Start Time  1348    PT Stop Time  1453    PT Time Calculation (min)  65 min    Equipment Utilized During Treatment  Gait belt    Activity Tolerance  Patient tolerated treatment well;Patient limited by fatigue    Behavior During Therapy  [facility] for tasks assessed/performed       Past Medical History:  Diagnosis Date  . Decreased libido   . Diverticulitis   . HLD (hyperlipidemia)   . Hydronephrosis with renal and ureteral calculus obstruction   . Hydronephrosis with renal and ureteral calculus obstruction   . Hypogonadism in male   . Rhinitis, allergic   . Sleep apnea   . Ureteral stone     Past Surgical History:  Procedure Laterality Date  . EXTERNAL EAR SURGERY    . FINGER SURGERY    . KNEE SURGERY    . left eye      There were no vitals filed for this visit.  Subjective Assessment - 01/24/20 1524    Subjective  Pt. reports some knee pain in the morning when he wakes up to go use the restroom but typically gets better after some movement. Pt. reports some soreness in his L arm due to the COVID vaccination. Pt. reports sensation of heaviness in the R LE and UE.    Pertinent History  Pt. had a right sided stroke and loves to watch football and travel to the beach with his wife.    Limitations  Walking;Standing    How long can you walk comfortably?  0.2 miles before heaviness/fatigue    Patient Stated Goals  Increase walking distance, carry things with  less pain in shoulder    Currently in Pain?  No/denies    Pain Onset  More than a month ago    Multiple Pain Sites  No    Pain Onset  More than a month ago      TheraEx NuStep Level 4 SPM >85 for 10' (.48 mile) Stair Climbing x 5 with verbal cueing for proper form  Neuromuscular Re-Education  [facility] Tapping 1 UE support in //-bars 4 laps  Star Balance at //-bars 1 UE 2 x 2 minutes each leg Foam Marching with 2 sec hold 2 min x 2  Gait training in Hallway 6 min (4.5 laps in hallway ie. 306 ft)    PT Long Term Goals - 12/26/19 1229      PT LONG TERM GOAL #1   Title  Pt. will be able to ambulate with least assistive device for 0.8 mile with good gait mechanics in home community to improve walking endurance.    Baseline  Pt. reports he can walk 0.2 mile in his mobile home community    Time  6    Period  Weeks    Status  New    Target Date  02/06/20  PT LONG TERM GOAL #2   Title  Pt. will improve FOTO score to > 57 to show improvements in LE function for ADLs    Baseline  41    Time  6    Period  Weeks    Status  New    Target Date  02/06/20      PT LONG TERM GOAL #3   Title  Pt. will increase R LE strength to grossly 5/5 to improve gait mechanics and LE strength for ADLs    Baseline  R hip flexion 4-/5, knee flexion 4+/5, ankle everison 4+/5    Time  6    Period  Weeks    Status  New    Target Date  02/06/20      PT LONG TERM GOAL #4   Title  Pt. will increase bilat UE flexion/abduction to 120 deg. for functional mobility for overhead reaching and bathing.    Baseline  R Flexion 79 with pain in deltoid, R Abduction 108; L flexion 131, L Abduction 109    Time  6    Period  Weeks    Status  New    Target Date  02/06/20            Plan - 01/24/20 1510    Clinical Impression Statement  Pt. was able to improve stair climbing using a step over step pattern with ascending and using a step to pattern with descending stairs. Pt. has difficulty with descending the  stairs with the L hand for UE support and is safer using the R UE to balance. Pt. contiues to challenge himself with trying to decrease UE support with standing balance activities in the //-bars but generally needs 1 UE support. Pt. has difficulty with eccentric control of the hip flexors with standing SLS on balance foam. Pt. has the most difficulty with SLS on the R with the L LE moving directly infront or behind him during star balance. Pt. reported fatigue with the 6 minutes of walking especially after 4 minutes. Pt. ambulated with the R foot in ER and decreased heel strike.    Personal Factors and Comorbidities  Comorbidity 2    Comorbidities  Hypertension, Obesity    Examination-Activity Limitations  Bed Mobility;Reach Overhead;Squat;Lift    Examination-Participation Restrictions  Community Activity;Yard Work    Conservation officer, historic buildings  Evolving/Moderate complexity    Rehab Potential  Good    PT Frequency  2x / week    PT Duration  6 weeks    PT Treatment/Interventions  ADLs/Self Care Home Management;Gait training;Stair training;Functional mobility training;Therapeutic activities;Therapeutic exercise;Balance training;Neuromuscular re-education;Manual techniques;Patient/family education    PT Next Visit Plan  RECERTIFICATION    Consulted and Agree with Plan of Care  Patient       Patient will benefit from skilled therapeutic intervention in order to improve the following deficits and impairments:  Abnormal gait, Decreased activity tolerance, Decreased balance, Decreased coordination, Decreased mobility, Decreased endurance, Decreased range of motion, Decreased strength, Difficulty walking, Dizziness, Impaired UE functional use, Pain  Visit Diagnosis: Other abnormalities of gait and mobility  Unspecified lack of coordination  Muscle weakness (generalized)     Problem List Patient Active Problem List   Diagnosis Date Noted  . Hemiparesis affecting right side as late effect  of cerebrovascular accident (HCC) 08/13/2019  . Other sleep apnea 08/06/2019  . History of kidney stones 05/02/2018  . ED (erectile dysfunction) 05/02/2018  . Hyperglycemia 01/30/2017  . Arthritis of knee, degenerative 01/30/2017  .  BPH with obstruction/lower urinary tract symptoms 02/03/2016  . Gross hematuria 02/03/2016  . Dyslipidemia 08/04/2015  . Skin lesions 07/14/2015  . Diverticulitis of large intestine without perforation or abscess without bleeding 06/30/2015  . Edema 06/30/2015  . OSA on CPAP 06/30/2015  . Kidney pain 06/30/2015    Pura Spice, PT, DPT # [id number] Andrey Campanile, SPT 01/24/2020, 3:27 PM  [facility] [address] [place], [place], [address] Phone: [phone]   Fax:  [phone]  Name: Tom Underwood MRN: [id number] Date of Birth: [birth year]

## 2020-01-28 ENCOUNTER — Encounter: Payer: Self-pay | Admitting: Physical Therapy

## 2020-01-28 ENCOUNTER — Ambulatory Visit: Payer: Medicare Other | Admitting: Physical Therapy

## 2020-01-28 ENCOUNTER — Other Ambulatory Visit: Payer: Self-pay

## 2020-01-28 DIAGNOSIS — M6281 Muscle weakness (generalized): Secondary | ICD-10-CM | POA: Diagnosis not present

## 2020-01-28 DIAGNOSIS — R2689 Other abnormalities of gait and mobility: Secondary | ICD-10-CM | POA: Diagnosis not present

## 2020-01-28 DIAGNOSIS — R279 Unspecified lack of coordination: Secondary | ICD-10-CM | POA: Diagnosis not present

## 2020-01-28 NOTE — Therapy (Signed)
[facility] [address] [place], [place], [address] Phone: [phone]   Fax:  [phone]  Physical Therapy Treatment Physical Therapy Progress Note   Dates of reporting period  12/26/2019 to 2/23/201  Patient Details  Name: Tom Underwood MRN: [id number] Date of Birth: 02-05-1951 Referring Provider (PT): Jennings Books, MD   Encounter Date: 01/28/2020  PT End of Session - 01/28/20 1045    Visit Number  10    Number of Visits  18    Date for PT Re-Evaluation  02/25/20    Authorization - Visit Number  1    Authorization - Number of Visits  10    PT Start Time  [phone]    PT Stop Time  1053    PT Time Calculation (min)  61 min    Equipment Utilized During Treatment  Gait belt    Activity Tolerance  Patient tolerated treatment well;Patient limited by fatigue    Behavior During Therapy  [facility] for tasks assessed/performed       Past Medical History:  Diagnosis Date  . Decreased libido   . Diverticulitis   . HLD (hyperlipidemia)   . Hydronephrosis with renal and ureteral calculus obstruction   . Hydronephrosis with renal and ureteral calculus obstruction   . Hypogonadism in male   . Rhinitis, allergic   . Sleep apnea   . Ureteral stone     Past Surgical History:  Procedure Laterality Date  . EXTERNAL EAR SURGERY    . FINGER SURGERY    . KNEE SURGERY    . left eye      There were no vitals filed for this visit.  Subjective Assessment - 01/28/20 1042    Subjective  Pt. reports pain in R shoulder 6/10 with shoulder elevation above 90 degs. Pt. also reports pain in the R hip and knee about 3-4/10.  Pt. entered PT with increase SOB noted.    Pertinent History  Pt. had a right sided stroke and loves to watch football and travel to the beach with his wife.    Limitations  Walking;Standing    How long can you walk comfortably?  0.2 miles before heaviness/fatigue    Patient Stated Goals  Increase walking distance, carry things  with less pain in shoulder    Currently in Pain?  Yes    Pain Score  6     Pain Location  Shoulder    Pain Orientation  Right    Pain Descriptors / Indicators  Aching;Dull    Pain Type  Chronic pain    Pain Onset  More than a month ago    Pain Frequency  Intermittent    Multiple Pain Sites  Yes    Pain Score  3    Pain Location  Hip    Pain Orientation  Right    Pain Descriptors / Indicators  Aching;Dull    Pain Onset  More than a month ago    Pain Frequency  Constant      Manual Therapy  Supine Proximal hamstring stretch 5 x 30 seconds  Supine hip flexion stretch 4 x 30 seconds Supine hip adductor stretch with knee in full extension 3 x 30 seconds  Supine lateral trunk rotations 5 second holds x 12 bilaterally  TheraEx NuStep 10' Level 3 (due to increased pain) Supine Hip flexion stretch 3 x 30 seconds Supine Heel Slides with 10 sec hold x 10 with blue belt Stair climbing 5x with  1 UE assist Seated ball walk outs with blue swiss ball 5 second holds (forward and lateral) x 5 (increased stretch in low back and lattisimus dorsi)  Seated contralateral leg/arm lifts while seated on wobble disc 2 x 10 alternating       PT Long Term Goals - 01/29/20 1002      PT LONG TERM GOAL #1   Title  Pt. will be able to ambulate with least assistive device for 0.8 mile with good gait mechanics in home community to improve walking endurance.    Baseline  Pt. reports he can walk 0.2 mile in his mobile home community    Time  4    Period  Weeks    Status  Partially Met    Target Date  02/25/20      PT LONG TERM GOAL #2   Title  Pt. will improve FOTO score to > 57 to show improvements in LE function for ADLs    Baseline  41    Time  4    Period  Weeks    Status  On-going    Target Date  02/25/20      PT LONG TERM GOAL #3   Title  Pt. will increase R LE strength to grossly 5/5 to improve gait mechanics and LE strength for ADLs    Baseline  R hip flexion 4-/5, knee flexion 4+/5,  ankle everison 4+/5    Time  4    Period  Weeks    Status  Partially Met    Target Date  02/25/20      PT LONG TERM GOAL #4   Title  Pt. will increase bilat UE flexion/abduction to 120 deg. for functional mobility for overhead reaching and bathing.    Baseline  R Flexion 79 with pain in deltoid, R Abduction 108; L flexion 131, L Abduction 109.  2/23: R sh. flexion >100 deg. (pain limited)    Time  4    Period  Weeks    Status  Partially Met    Target Date  02/25/20            Plan - 01/28/20 1151    Clinical Impression Statement  Pt. ambulated into the clinic today with decreased stride length and apprehension due to increased pain in the R hip/knee/shoulder.  Pt. has decreased lumbar spinal mobility with lateral trunk rotations in supine and with seated forward flexion with a ball. Pt. has decreased R hip motions due to soft tissue approximation in flexion and notable decreased in R hip IR and ER. Pt. was unable to use reciprocol gait pattern with stair climbing due to pain but use a step to pattern with ascending and descending stairs.    Personal Factors and Comorbidities  Comorbidity 2    Comorbidities  Hypertension, Obesity    Examination-Activity Limitations  Bed Mobility;Reach Overhead;Squat;Lift    Examination-Participation Restrictions  Community Activity;Yard Work    Merchant [employer] officer  Evolving/Moderate complexity    Clinical Decision Making  Moderate    Rehab Potential  Good    PT Frequency  2x / week    PT Duration  4 weeks    PT Treatment/Interventions  ADLs/Self Care Home Management;Gait training;Stair training;Functional mobility training;Therapeutic activities;Therapeutic exercise;Balance training;Neuromuscular re-education;Manual techniques;Patient/family education    PT Next Visit Plan  Check FOTO.  Progress HEP.    Consulted and Agree with Plan of Care  Patient       Patient will benefit from skilled therapeutic  intervention in order to  improve the following deficits and impairments:  Abnormal gait, Decreased activity tolerance, Decreased balance, Decreased coordination, Decreased mobility, Decreased endurance, Decreased range of motion, Decreased strength, Difficulty walking, Dizziness, Impaired UE functional use, Pain  Visit Diagnosis: Other abnormalities of gait and mobility  Unspecified lack of coordination  Muscle weakness (generalized)     Problem List Patient Active Problem List   Diagnosis Date Noted  . Hemiparesis affecting right side as late effect of cerebrovascular accident ([place]) 08/13/2019  . Other sleep apnea 08/06/2019  . History of kidney stones 05/02/2018  . ED (erectile dysfunction) 05/02/2018  . Hyperglycemia 01/30/2017  . Arthritis of knee, degenerative 01/30/2017  . BPH with obstruction/lower urinary tract symptoms 02/03/2016  . Gross hematuria 02/03/2016  . Dyslipidemia 08/04/2015  . Skin lesions 07/14/2015  . Diverticulitis of large intestine without perforation or abscess without bleeding 06/30/2015  . Edema 06/30/2015  . OSA on CPAP 06/30/2015  . Kidney pain 06/30/2015   Pura Spice, PT, DPT # [phone] 01/29/2020, 10:04 AM  [facility] [address] [place], [place], [address] Phone: [phone]   Fax:  [phone]  Name: Tom Underwood MRN: [id number] Date of Birth: August 26, 1951

## 2020-01-30 ENCOUNTER — Other Ambulatory Visit: Payer: Self-pay

## 2020-01-30 ENCOUNTER — Ambulatory Visit: Payer: Medicare Other | Admitting: Physical Therapy

## 2020-01-30 ENCOUNTER — Encounter: Payer: Self-pay | Admitting: Physical Therapy

## 2020-01-30 DIAGNOSIS — R2689 Other abnormalities of gait and mobility: Secondary | ICD-10-CM

## 2020-01-30 DIAGNOSIS — M6281 Muscle weakness (generalized): Secondary | ICD-10-CM

## 2020-01-30 DIAGNOSIS — R279 Unspecified lack of coordination: Secondary | ICD-10-CM | POA: Diagnosis not present

## 2020-01-30 NOTE — Therapy (Signed)
[facility] [address] [place], [place], [address] Phone: [phone]   Fax:  [phone]  Physical Therapy Treatment  Patient Details  Name: Tom Underwood MRN: [id number] Date of Birth: 1951-07-31 Referring Provider (PT): Jennings Books, MD   Encounter Date: 01/30/2020  PT End of Session - 01/30/20 1130    Visit Number  11    Number of Visits  18    Date for PT Re-Evaluation  02/25/20    Authorization - Visit Number  2    Authorization - Number of Visits  10    PT Start Time  [phone]    PT Stop Time  1035    PT Time Calculation (min)  52 min    Equipment Utilized During Treatment  Gait belt    Activity Tolerance  Patient tolerated treatment well;Patient limited by fatigue    Behavior During Therapy  [facility] for tasks assessed/performed       Past Medical History:  Diagnosis Date  . Decreased libido   . Diverticulitis   . HLD (hyperlipidemia)   . Hydronephrosis with renal and ureteral calculus obstruction   . Hydronephrosis with renal and ureteral calculus obstruction   . Hypogonadism in male   . Rhinitis, allergic   . Sleep apnea   . Ureteral stone     Past Surgical History:  Procedure Laterality Date  . EXTERNAL EAR SURGERY    . FINGER SURGERY    . KNEE SURGERY    . left eye      There were no vitals filed for this visit.  Subjective Assessment - 01/30/20 1128    Subjective  Pt. still reports bilateral knee joint stiffness and is frustrated with the heaviness in the R UE/LE. Pt. had decreased SOB noted compared to last visit.    Pertinent History  Pt. had a right sided stroke and loves to watch football and travel to the beach with his wife.    Limitations  Walking;Standing    How long can you walk comfortably?  0.2 miles before heaviness/fatigue    Patient Stated Goals  Increase walking distance, carry things with less pain in shoulder    Currently in Pain?  Yes    Pain Score  6     Pain Location  Shoulder     Pain Orientation  Right    Pain Descriptors / Indicators  Aching;Dull    Pain Type  Chronic pain    Pain Onset  More than a month ago    Pain Frequency  Intermittent    Multiple Pain Sites  No    Pain Onset  More than a month ago      Neuromuscular Re-education Standing reactive star balance (sticky notes with letters) 3 minutes x each leg with //-bars for support (decreased to 1 UE support) Standing resisted walking (forward/backward/sidestepping) 10 laps each way in //-bars (no UE support, focusing on controlling the return) Hurdle walking in //-bars (low to high to low) x 10 (no UE support, noted decrease circumduction)  TheraEx Nustep Level 5 SPM 80 (.46 mile)     PT Education - 01/30/20 1129    Education Details  Pt. was given and updated HEP with new balance activities with counter/chair suppoort    Person(s) Educated  Patient    Methods  Explanation;Demonstration;Handout    Comprehension  Verbalized understanding;Returned demonstration          PT Long Term Goals - 01/29/20 1002  PT LONG TERM GOAL #1   Title  Pt. will be able to ambulate with least assistive device for 0.8 mile with good gait mechanics in home community to improve walking endurance.    Baseline  Pt. reports he can walk 0.2 mile in his mobile home community    Time  4    Period  Weeks    Status  Partially Met    Target Date  02/25/20      PT LONG TERM GOAL #2   Title  Pt. will improve FOTO score to > 57 to show improvements in LE function for ADLs    Baseline  41    Time  4    Period  Weeks    Status  On-going    Target Date  02/25/20      PT LONG TERM GOAL #3   Title  Pt. will increase R LE strength to grossly 5/5 to improve gait mechanics and LE strength for ADLs    Baseline  R hip flexion 4-/5, knee flexion 4+/5, ankle everison 4+/5    Time  4    Period  Weeks    Status  Partially Met    Target Date  02/25/20      PT LONG TERM GOAL #4   Title  Pt. will increase bilat UE  flexion/abduction to 120 deg. for functional mobility for overhead reaching and bathing.    Baseline  R Flexion 79 with pain in deltoid, R Abduction 108; L flexion 131, L Abduction 109.  2/23: R sh. flexion >100 deg. (pain limited)    Time  4    Period  Weeks    Status  Partially Met    Target Date  02/25/20        Plan - 01/30/20 1130    Clinical Impression Statement  Pt. was able to increase level of resistance on the NuStep and maintain the SPM above 80 for the 10 minutes. Pt. was able to decrease UE support with standing reactive star balance. Pt. had good reaction time to move the LE to the correct sticky note but did not always get the foot to the sticky note. Pt. has increased difficulty when standing on the R LE and moving the LE. Pt. had decreased ability to control the return with resisted walking forward/backwards/sidestepping especially leading with the R LE. Pt. showed improvements in hurdles when transitioning from low hurdle to high hurdle. Pt. improved FOTO score to 49/57 (8 point improvement). Pt. will benefit from continued skilled PT to improve balance and proprioception of the R LE and UE.    Personal Factors and Comorbidities  Comorbidity 2    Comorbidities  Hypertension, Obesity    Examination-Activity Limitations  Bed Mobility;Reach Overhead;Squat;Lift    Examination-Participation Restrictions  Community Activity;Yard Work    Stability/Clinical Decision Making  Evolving/Moderate complexity    Clinical Decision Making  Moderate    Rehab Potential  Good    PT Frequency  2x / week    PT Duration  4 weeks    PT Treatment/Interventions  ADLs/Self Care Home Management;Gait training;Stair training;Functional mobility training;Therapeutic activities;Therapeutic exercise;Balance training;Neuromuscular re-education;Manual techniques;Patient/family education    PT Next Visit Plan  Resisted Walking, Progress exercises    Consulted and Agree with Plan of Care  Patient        Patient will benefit from skilled therapeutic intervention in order to improve the following deficits and impairments:  Abnormal gait, Decreased activity tolerance, Decreased balance, Decreased coordination, Decreased   mobility, Decreased endurance, Decreased range of motion, Decreased strength, Difficulty walking, Dizziness, Impaired UE functional use, Pain  Visit Diagnosis: Other abnormalities of gait and mobility  Unspecified lack of coordination  Muscle weakness (generalized)     Problem List Patient Active Problem List   Diagnosis Date Noted  . Hemiparesis affecting right side as late effect of cerebrovascular accident (HCC) 08/13/2019  . Other sleep apnea 08/06/2019  . History of kidney stones 05/02/2018  . ED (erectile dysfunction) 05/02/2018  . Hyperglycemia 01/30/2017  . Arthritis of knee, degenerative 01/30/2017  . BPH with obstruction/lower urinary tract symptoms 02/03/2016  . Gross hematuria 02/03/2016  . Dyslipidemia 08/04/2015  . Skin lesions 07/14/2015  . Diverticulitis of large intestine without perforation or abscess without bleeding 06/30/2015  . Edema 06/30/2015  . OSA on CPAP 06/30/2015  . Kidney pain 06/30/2015   [name] C Sherk, PT, DPT # [id number] Elizabeth Landers, SPT 01/30/2020, 5:50 PM  [facility] [address] [place], [place], [address] Phone: [phone]   Fax:  [phone]  Name: Jaxn D Simonin MRN: [id number] Date of Birth: 02/16/1951   

## 2020-01-30 NOTE — Patient Instructions (Signed)
Access Code: [email or web address]  URL: [email or web address]  Date: 01/30/2020  Prepared by: Dorene Grebe   Exercises  Single Leg Balance with Opposite Leg Star Reach - 10 reps - 3 sets - 1x daily - 7x weekly  Single Leg Stance with Support - 10 reps - 3 sets - 1x daily - 7x weekly  Forward Reach Using Hip Strategy Forward Backward - 10 reps - 3 sets - 1x daily - 7x weekly  Forward Reach Using Hip Strategy Side to Side - 10 reps - 3 sets - 1x daily - 7x weekly

## 2020-02-03 ENCOUNTER — Other Ambulatory Visit: Payer: Self-pay

## 2020-02-03 ENCOUNTER — Ambulatory Visit: Payer: Medicare Other | Attending: Neurology | Admitting: Physical Therapy

## 2020-02-03 ENCOUNTER — Encounter: Payer: Self-pay | Admitting: Physical Therapy

## 2020-02-03 DIAGNOSIS — R279 Unspecified lack of coordination: Secondary | ICD-10-CM | POA: Diagnosis not present

## 2020-02-03 DIAGNOSIS — R2689 Other abnormalities of gait and mobility: Secondary | ICD-10-CM | POA: Insufficient documentation

## 2020-02-03 DIAGNOSIS — M6281 Muscle weakness (generalized): Secondary | ICD-10-CM | POA: Diagnosis not present

## 2020-02-03 NOTE — Therapy (Signed)
[facility] [address] [place], [place], [address] Phone: [phone]   Fax:  [phone]  Physical Therapy Treatment  Patient Details  Name: Tom Underwood MRN: [id number] Date of Birth: Jan 10, 1951 Referring Provider (PT): Jennings Books, MD   Encounter Date: 02/03/2020  PT End of Session - 02/03/20 0938    Visit Number  12    Number of Visits  18    Date for PT Re-Evaluation  02/25/20    Authorization - Visit Number  3    Authorization - Number of Visits  10    PT Start Time  0939    PT Stop Time  1031    PT Time Calculation (min)  52 min    Equipment Utilized During Treatment  Gait belt    Activity Tolerance  Patient tolerated treatment well;Patient limited by fatigue    Behavior During Therapy  [facility] for tasks assessed/performed       Past Medical History:  Diagnosis Date  . Decreased libido   . Diverticulitis   . HLD (hyperlipidemia)   . Hydronephrosis with renal and ureteral calculus obstruction   . Hydronephrosis with renal and ureteral calculus obstruction   . Hypogonadism in male   . Rhinitis, allergic   . Sleep apnea   . Ureteral stone     Past Surgical History:  Procedure Laterality Date  . EXTERNAL EAR SURGERY    . FINGER SURGERY    . KNEE SURGERY    . left eye      There were no vitals filed for this visit.  Subjective Assessment - 02/03/20 0938    Subjective  Pt. reports having a good weekend.  No new issues.    Pertinent History  Pt. had a right sided stroke and loves to watch football and travel to the beach with his wife.    Limitations  Walking;Standing    How long can you walk comfortably?  0.2 miles before heaviness/fatigue    Patient Stated Goals  Increase walking distance, carry things with less pain in shoulder    Currently in Pain?  Yes    Pain Score  2     Pain Location  Shoulder    Pain Orientation  Right    Pain Onset  More than a month ago    Pain Onset  More than a month ago         TheraEx  Nustep Level 5 10 min. B UE/LE (consistent cadence)- O2 sat. 96% Sit to stands from gray chair at //-bars with cuing for proper technique 7x Standing R shoulder overhead reaching/ flexion. Discussed HEP    Neuromuscular Re-education  Standing resisted walking at Nautilus:  (forward/backward/sidestepping) with no UE support, focusing on controlling the return (70#)- 3x all planes.  Extra time/ SBA for safety  Walking (forward/ lateral) cone taps in //-bars with light to no UE assist.  CGA for cuing/ safety.  Mirror feedback for proper technique.    Standing reactive cone touches (varying colors) 3 minutes x each leg with //-bars for support (decreased to 1 UE support)  Walking in clinic/ outside with use of SPC working on consistent step pattern/ curbs.       PT Long Term Goals - 01/29/20 1002      PT LONG TERM GOAL #1   Title  Pt. will be able to ambulate with least assistive device for 0.8 mile with good gait mechanics in home community to improve walking endurance.  Baseline  Pt. reports he can walk 0.2 mile in his mobile home community    Time  4    Period  Weeks    Status  Partially Met    Target Date  02/25/20      PT LONG TERM GOAL #2   Title  Pt. will improve FOTO score to > 57 to show improvements in LE function for ADLs    Baseline  41    Time  4    Period  Weeks    Status  On-going    Target Date  02/25/20      PT LONG TERM GOAL #3   Title  Pt. will increase R LE strength to grossly 5/5 to improve gait mechanics and LE strength for ADLs    Baseline  R hip flexion 4-/5, knee flexion 4+/5, ankle everison 4+/5    Time  4    Period  Weeks    Status  Partially Met    Target Date  02/25/20      PT LONG TERM GOAL #4   Title  Pt. will increase bilat UE flexion/abduction to 120 deg. for functional mobility for overhead reaching and bathing.    Baseline  R Flexion 79 with pain in deltoid, R Abduction 108; L flexion 131, L Abduction 109.  2/23: R  sh. flexion >100 deg. (pain limited)    Time  4    Period  Weeks    Status  Partially Met    Target Date  02/25/20            Plan - 02/03/20 0939    Clinical Impression Statement  Pt. worked hard progressing with LE resisted ther.ex./ gait without use of UE (all planes).  No LOB during tx. session but marked SOB with all ther.ex./ balance tasks.  Pts. O2 sat remains >96% t/o tx. session.  No worsening R sh. pain during standing ther.ex./ overhead reaching tasks.  No change to HEP at this time.    Personal Factors and Comorbidities  Comorbidity 2    Comorbidities  Hypertension, Obesity    Examination-Activity Limitations  Bed Mobility;Reach Overhead;Squat;Lift    Examination-Participation Restrictions  Community Activity;Yard Work    [employer]  Evolving/Moderate complexity    Clinical Decision Making  Moderate    Rehab Potential  Good    PT Frequency  2x / week    PT Duration  4 weeks    PT Treatment/Interventions  ADLs/Self Care Home Management;Gait training;Stair training;Functional mobility training;Therapeutic activities;Therapeutic exercise;Balance training;Neuromuscular re-education;Manual techniques;Patient/family education    PT Next Visit Plan  Resisted Walking, Progress exercises next tx. session.    Consulted and Agree with Plan of Care  Patient       Patient will benefit from skilled therapeutic intervention in order to improve the following deficits and impairments:  Abnormal gait, Decreased activity tolerance, Decreased balance, Decreased coordination, Decreased mobility, Decreased endurance, Decreased range of motion, Decreased strength, Difficulty walking, Dizziness, Impaired UE functional use, Pain  Visit Diagnosis: Other abnormalities of gait and mobility  Unspecified lack of coordination  Muscle weakness (generalized)     Problem List Patient Active Problem List   Diagnosis Date Noted  . Hemiparesis affecting right side as late  effect of cerebrovascular accident ([place]) 08/13/2019  . Other sleep apnea 08/06/2019  . History of kidney stones 05/02/2018  . ED (erectile dysfunction) 05/02/2018  . Hyperglycemia 01/30/2017  . Arthritis of knee, degenerative 01/30/2017  . BPH with obstruction/lower urinary tract  symptoms 02/03/2016  . Gross hematuria 02/03/2016  . Dyslipidemia 08/04/2015  . Skin lesions 07/14/2015  . Diverticulitis of large intestine without perforation or abscess without bleeding 06/30/2015  . Edema 06/30/2015  . OSA on CPAP 06/30/2015  . Kidney pain 06/30/2015   Pura Spice, PT, DPT # [id number] 02/03/2020, 12:23 PM  [facility] [address] [place], [place], [address] Phone: [phone]   Fax:  [phone]  Name: Tom Underwood MRN: [id number] Date of Birth: 1951/02/25

## 2020-02-06 ENCOUNTER — Other Ambulatory Visit: Payer: Self-pay

## 2020-02-06 ENCOUNTER — Encounter: Payer: Self-pay | Admitting: Physical Therapy

## 2020-02-06 ENCOUNTER — Ambulatory Visit: Payer: Medicare Other | Admitting: Physical Therapy

## 2020-02-06 DIAGNOSIS — R2689 Other abnormalities of gait and mobility: Secondary | ICD-10-CM | POA: Diagnosis not present

## 2020-02-06 DIAGNOSIS — R279 Unspecified lack of coordination: Secondary | ICD-10-CM | POA: Diagnosis not present

## 2020-02-06 DIAGNOSIS — M6281 Muscle weakness (generalized): Secondary | ICD-10-CM | POA: Diagnosis not present

## 2020-02-06 NOTE — Therapy (Signed)
[facility] [address] [place], [place], [address] Phone: [phone]   Fax:  [phone]  Physical Therapy Treatment  Patient Details  Name: CLYDELL SPOSITO MRN: [id number] Date of Birth: 10/13/1951 Referring Provider (PT): Jennings Books, MD   Encounter Date: 02/06/2020    Treatment 13 of 18.  Recert date: 4/40/3474 1031 to 1120    Past Medical History:  Diagnosis Date  . Decreased libido   . Diverticulitis   . HLD (hyperlipidemia)   . Hydronephrosis with renal and ureteral calculus obstruction   . Hydronephrosis with renal and ureteral calculus obstruction   . Hypogonadism in male   . Rhinitis, allergic   . Sleep apnea   . Ureteral stone     Past Surgical History:  Procedure Laterality Date  . EXTERNAL EAR SURGERY    . FINGER SURGERY    . KNEE SURGERY    . left eye      There were no vitals filed for this visit.    Pt. states he feels stiffness in B knees/ shoulders this morning. Pt. states he feels discomfort in B deltoids with prolonged overhead reaching/ hanging up clothes.         TheraEx  Nustep Level 6 10 min. B UE/LE (consistent cadence/ no rest breaks)- O2 sat. 96%/ HR 99%.  TRX sit to stands 10x2 (good technique)- increase SOB noted.    Seated alt. UE/LE 10x2.  B shoulder flexion/ horizontal abduction/ scapular retraction with no resistance (AROM only)- 10x each with mirror feedback.    Reviewed HEP/ squats at home with counter support.   Neuromuscular Re-education  Standing resisted walking at Nautilus:  (forward/backward/sidestepping) with no UE support, focusing on controlling the return (70#)- 3x all planes.  Extra time/ SBA for safety  Walking (forward/ lateral) cone taps in //-bars with light to no UE assist.  CGA for cuing/ safety.  Mirror feedback for proper technique.    Walking in clinic/ outside with use of SPC working on consistent step pattern/ curbs.     PT Long  Term Goals - 01/29/20 1002      PT LONG TERM GOAL #1   Title  Pt. will be able to ambulate with least assistive device for 0.8 mile with good gait mechanics in home community to improve walking endurance.    Baseline  Pt. reports he can walk 0.2 mile in his mobile home community    Time  4    Period  Weeks    Status  Partially Met    Target Date  02/25/20      PT LONG TERM GOAL #2   Title  Pt. will improve FOTO score to > 57 to show improvements in LE function for ADLs    Baseline  41    Time  4    Period  Weeks    Status  On-going    Target Date  02/25/20      PT LONG TERM GOAL #3   Title  Pt. will increase R LE strength to grossly 5/5 to improve gait mechanics and LE strength for ADLs    Baseline  R hip flexion 4-/5, knee flexion 4+/5, ankle everison 4+/5    Time  4    Period  Weeks    Status  Partially Met    Target Date  02/25/20      PT LONG TERM GOAL #4   Title  Pt. will increase bilat UE flexion/abduction to 120  deg. for functional mobility for overhead reaching and bathing.    Baseline  R Flexion 79 with pain in deltoid, R Abduction 108; L flexion 131, L Abduction 109.  2/23: R sh. flexion >100 deg. (pain limited)    Time  4    Period  Weeks    Status  Partially Met    Target Date  02/25/20         Pt. easily fatigued with standing/ therex. and requires short seated rest breaks. PT encouraged more consistent arm swing during gait to improve balance. Pt. works hard with resisted therex. and showed improved with Nautilus resisted gait with no LOB.      Patient will benefit from skilled therapeutic intervention in order to improve the following deficits and impairments:  Abnormal gait, Decreased activity tolerance, Decreased balance, Decreased coordination, Decreased mobility, Decreased endurance, Decreased range of motion, Decreased strength, Difficulty walking, Dizziness, Impaired UE functional use, Pain  Visit Diagnosis: Other abnormalities of gait and  mobility  Unspecified lack of coordination  Muscle weakness (generalized)     Problem List Patient Active Problem List   Diagnosis Date Noted  . Hemiparesis affecting right side as late effect of cerebrovascular accident ([place]) 08/13/2019  . Other sleep apnea 08/06/2019  . History of kidney stones 05/02/2018  . ED (erectile dysfunction) 05/02/2018  . Hyperglycemia 01/30/2017  . Arthritis of knee, degenerative 01/30/2017  . BPH with obstruction/lower urinary tract symptoms 02/03/2016  . Gross hematuria 02/03/2016  . Dyslipidemia 08/04/2015  . Skin lesions 07/14/2015  . Diverticulitis of large intestine without perforation or abscess without bleeding 06/30/2015  . Edema 06/30/2015  . OSA on CPAP 06/30/2015  . Kidney pain 06/30/2015   Pura Spice, PT, DPT # [id number] 02/09/2020, 8:38 AM   [facility] [address] [place], [place], [address] Phone: [phone]   Fax:  [phone]  Name: GRAYLON AMORY MRN: [id number] Date of Birth: Mar 13, 1951

## 2020-02-11 ENCOUNTER — Ambulatory Visit: Payer: Medicare Other | Admitting: Physical Therapy

## 2020-02-11 ENCOUNTER — Encounter: Payer: Self-pay | Admitting: Physical Therapy

## 2020-02-11 ENCOUNTER — Other Ambulatory Visit: Payer: Self-pay

## 2020-02-11 DIAGNOSIS — M6281 Muscle weakness (generalized): Secondary | ICD-10-CM

## 2020-02-11 DIAGNOSIS — R279 Unspecified lack of coordination: Secondary | ICD-10-CM | POA: Diagnosis not present

## 2020-02-11 DIAGNOSIS — R2689 Other abnormalities of gait and mobility: Secondary | ICD-10-CM | POA: Diagnosis not present

## 2020-02-11 NOTE — Therapy (Signed)
[facility] [address] [place], [place], [address] Phone: [phone]   Fax:  [phone]  Physical Therapy Treatment  Patient Details  Name: Tom Underwood MRN: [id number] Date of Birth: 1950-12-20 Referring Provider (PT): Jennings Books, MD   Encounter Date: 02/11/2020  PT End of Session - 02/11/20 1040    Visit Number  14    Number of Visits  18    Date for PT Re-Evaluation  02/25/20    Authorization - Visit Number  5    Authorization - Number of Visits  10    PT Start Time  1025    PT Stop Time  1119    PT Time Calculation (min)  54 min    Equipment Utilized During Treatment  Gait belt    Activity Tolerance  Patient tolerated treatment well;Patient limited by fatigue    Behavior During Therapy  [facility] for tasks assessed/performed       Past Medical History:  Diagnosis Date  . Decreased libido   . Diverticulitis   . HLD (hyperlipidemia)   . Hydronephrosis with renal and ureteral calculus obstruction   . Hydronephrosis with renal and ureteral calculus obstruction   . Hypogonadism in male   . Rhinitis, allergic   . Sleep apnea   . Ureteral stone     Past Surgical History:  Procedure Laterality Date  . EXTERNAL EAR SURGERY    . FINGER SURGERY    . KNEE SURGERY    . left eye      There were no vitals filed for this visit.  Subjective Assessment - 02/11/20 1038    Subjective  Pt. reports an improvement in L lower leg heaviness but states L knee still is tight/heavy.  R lower leg swelling noted prior to PT tx. session.  Pt. states he looked for compression socks this weekend but did not find them.    Pertinent History  Pt. had a right sided stroke and loves to watch football and travel to the beach with his wife.    Limitations  Walking;Standing    How long can you walk comfortably?  0.2 miles before heaviness/fatigue    Patient Stated Goals  Increase walking distance, carry things with less pain in shoulder    Currently in Pain?  Yes    Pain Score  2     Pain Location  Shoulder    Pain Orientation  Right    Pain Descriptors / Indicators  Aching;Dull    Pain Onset  More than a month ago    Pain Onset  More than a month ago         TheraEx  Nustep Level 610 min. B UE/LE (consistent cadence/ no rest breaks)- O2 sat. 96%/ HR 99%.   Seated R shoulder AROM: flexion/ abduction/ horizontal adduction (limited by pain/ stiffness).    Discussed HEP   Neuromuscular Re-education  Dynamic balance: star tasks with cone (focus on step length/ unable to touch top of cones)- difficulty with any hip adduction component.  No LOB but CGA for safety.    Tandem stance in //-bars (light assist progressing to no UE assist).  Pt. Did better with L leg behind right leg today.    Standing resisted walkingat Nautilus:(forward/backward/sidestepping) with no UE support, focusing on controlling the return(70#)-3x all planes.Extra time/ SBA for safety  Walking (forward/ lateral) cone taps in //-bars with light to no UE assist. CGA for cuing/ safety. Mirror feedback for proper technique.  Walking in clinic/ outside with use of SPC working on consistent step pattern/ curbs.    PT Long Term Goals - 01/29/20 1002      PT LONG TERM GOAL #1   Title  Pt. will be able to ambulate with least assistive device for 0.8 mile with good gait mechanics in home community to improve walking endurance.    Baseline  Pt. reports he can walk 0.2 mile in his mobile home community    Time  4    Period  Weeks    Status  Partially Met    Target Date  02/25/20      PT LONG TERM GOAL #2   Title  Pt. will improve FOTO score to > 57 to show improvements in LE function for ADLs    Baseline  41    Time  4    Period  Weeks    Status  On-going    Target Date  02/25/20      PT LONG TERM GOAL #3   Title  Pt. will increase R LE strength to grossly 5/5 to improve gait mechanics and LE strength for ADLs    Baseline  R  hip flexion 4-/5, knee flexion 4+/5, ankle everison 4+/5    Time  4    Period  Weeks    Status  Partially Met    Target Date  02/25/20      PT LONG TERM GOAL #4   Title  Pt. will increase bilat UE flexion/abduction to 120 deg. for functional mobility for overhead reaching and bathing.    Baseline  R Flexion 79 with pain in deltoid, R Abduction 108; L flexion 131, L Abduction 109.  2/23: R sh. flexion >100 deg. (pain limited)    Time  4    Period  Weeks    Status  Partially Met    Target Date  02/25/20            Plan - 02/11/20 1041    Clinical Impression Statement  Generalized muscle fatigue t/o tx. but pt. motivated/ hard working with dynamic balance and resisted gait.  No LOB during tx. session but extra time/ focus during star ex.  Improved R shoulder AROM/ overhead reaching in standing posture.  Pt. continues to benefit from use of [facility] for safety with gait pattern, esp. with outside/ uneven terrain walking.    Personal Factors and Comorbidities  Comorbidity 2    Comorbidities  Hypertension, Obesity    Examination-Activity Limitations  Bed Mobility;Reach Overhead;Squat;Lift    Examination-Participation Restrictions  Community Activity;Yard Work    [employer] officer  Evolving/Moderate complexity    Clinical Decision Making  Moderate    Rehab Potential  Good    PT Frequency  2x / week    PT Duration  4 weeks    PT Treatment/Interventions  ADLs/Self Care Home Management;Gait training;Stair training;Functional mobility training;Therapeutic activities;Therapeutic exercise;Balance training;Neuromuscular re-education;Manual techniques;Patient/family education    PT Next Visit Plan  Resisted Walking/ dynamic balance tasks.    Consulted and Agree with Plan of Care  Patient       Patient will benefit from skilled therapeutic intervention in order to improve the following deficits and impairments:  Abnormal gait, Decreased activity tolerance, Decreased balance,  Decreased coordination, Decreased mobility, Decreased endurance, Decreased range of motion, Decreased strength, Difficulty walking, Dizziness, Impaired UE functional use, Pain  Visit Diagnosis: Other abnormalities of gait and mobility  Unspecified lack of coordination  Muscle weakness (generalized)  Problem List Patient Active Problem List   Diagnosis Date Noted  . Hemiparesis affecting right side as late effect of cerebrovascular accident ([place]) 08/13/2019  . Other sleep apnea 08/06/2019  . History of kidney stones 05/02/2018  . ED (erectile dysfunction) 05/02/2018  . Hyperglycemia 01/30/2017  . Arthritis of knee, degenerative 01/30/2017  . BPH with obstruction/lower urinary tract symptoms 02/03/2016  . Gross hematuria 02/03/2016  . Dyslipidemia 08/04/2015  . Skin lesions 07/14/2015  . Diverticulitis of large intestine without perforation or abscess without bleeding 06/30/2015  . Edema 06/30/2015  . OSA on CPAP 06/30/2015  . Kidney pain 06/30/2015   Pura Spice, PT, DPT # [id number] 02/11/2020, 12:51 PM   [facility] [address] [place], [place], [address] Phone: [phone]   Fax:  [phone]  Name: JETTIE LAZARE MRN: [id number] Date of Birth: [birth year]

## 2020-02-13 ENCOUNTER — Ambulatory Visit: Payer: Medicare Other | Admitting: Physical Therapy

## 2020-02-13 ENCOUNTER — Encounter: Payer: Self-pay | Admitting: Physical Therapy

## 2020-02-13 ENCOUNTER — Other Ambulatory Visit: Payer: Self-pay

## 2020-02-13 DIAGNOSIS — M6281 Muscle weakness (generalized): Secondary | ICD-10-CM

## 2020-02-13 DIAGNOSIS — R279 Unspecified lack of coordination: Secondary | ICD-10-CM | POA: Diagnosis not present

## 2020-02-13 DIAGNOSIS — R2689 Other abnormalities of gait and mobility: Secondary | ICD-10-CM

## 2020-02-13 NOTE — Therapy (Signed)
[facility] [address] [place], [place], [address] Phone: [phone]   Fax:  [phone]  Physical Therapy Treatment  Patient Details  Name: Tom Underwood MRN: [id number] Date of Birth: 06-Jul-1951 Referring Provider (PT): Jennings Books, MD   Encounter Date: 02/13/2020    Treatment: 15 of 18.  Recert date: 8/54/6270  1026 to 1120    Past Medical History:  Diagnosis Date  . Decreased libido   . Diverticulitis   . HLD (hyperlipidemia)   . Hydronephrosis with renal and ureteral calculus obstruction   . Hydronephrosis with renal and ureteral calculus obstruction   . Hypogonadism in male   . Rhinitis, allergic   . Sleep apnea   . Ureteral stone     Past Surgical History:  Procedure Laterality Date  . EXTERNAL EAR SURGERY    . FINGER SURGERY    . KNEE SURGERY    . left eye      There were no vitals filed for this visit.   Pt. continues to be heaviness in L knee/ UE. Pt. entered PT with increase SOB.       TheraEx  Nustep Level610 min. B UE/LE (consistent cadence)- increase SOB (O2 sat. 95%/ HR 89 bpm.).    Seated R shoulder AROM: flexion/ abduction/ horizontal adduction (limited by pain/ stiffness).    3"/6" step ups with //-bars assist (cuing to correct posture/ mirror feedback).    Neuromuscular Re-education  Walking in gym with cone maneuvering/ cone taps.  No assistive device  Airex walking in //-bars (step ups/ overs) with 1 UE assist.  Mirror feedback for proper position/ upright posture.    Standing resisted walkingat Nautilus:(forward/backward/sidestepping) with no UE support, focusing on controlling the return(70#)-3x all planes.Extra time/ SBA for safety  Walking in clinic/ outside with use of SPC working on consistent step pattern/ curbs.    PT Long Term Goals - 01/29/20 1002      PT LONG TERM GOAL #1   Title  Pt. will be able to ambulate with least assistive device for  0.8 mile with good gait mechanics in home community to improve walking endurance.    Baseline  Pt. reports he can walk 0.2 mile in his mobile home community    Time  4    Period  Weeks    Status  Partially Met    Target Date  02/25/20      PT LONG TERM GOAL #2   Title  Pt. will improve FOTO score to > 57 to show improvements in LE function for ADLs    Baseline  41    Time  4    Period  Weeks    Status  On-going    Target Date  02/25/20      PT LONG TERM GOAL #3   Title  Pt. will increase R LE strength to grossly 5/5 to improve gait mechanics and LE strength for ADLs    Baseline  R hip flexion 4-/5, knee flexion 4+/5, ankle everison 4+/5    Time  4    Period  Weeks    Status  Partially Met    Target Date  02/25/20      PT LONG TERM GOAL #4   Title  Pt. will increase bilat UE flexion/abduction to 120 deg. for functional mobility for overhead reaching and bathing.    Baseline  R Flexion 79 with pain in deltoid, R Abduction 108; L flexion 131, L Abduction 109.  2/23: R sh. flexion >100 deg. (pain limited)    Time  4    Period  Weeks    Status  Partially Met    Target Date  02/25/20         Pt. able to complete star tasks with controlled cone taps but extra time and SBA for safety. Pt. limited with hip adduction during star tasks. Good cone maneuvering in clinic without use of SPC and limited cuing to increase BOS/ heel strike. Pt. instructed to correct upright posture/ head position with UE/ balance tasks. Several seated rest breaks required with therex. due to fatigue. No change to HEP.      Patient will benefit from skilled therapeutic intervention in order to improve the following deficits and impairments:  Abnormal gait, Decreased activity tolerance, Decreased balance, Decreased coordination, Decreased mobility, Decreased endurance, Decreased range of motion, Decreased strength, Difficulty walking, Dizziness, Impaired UE functional use, Pain  Visit Diagnosis: Other  abnormalities of gait and mobility  Unspecified lack of coordination  Muscle weakness (generalized)     Problem List Patient Active Problem List   Diagnosis Date Noted  . Hemiparesis affecting right side as late effect of cerebrovascular accident ([place]) 08/13/2019  . Other sleep apnea 08/06/2019  . History of kidney stones 05/02/2018  . ED (erectile dysfunction) 05/02/2018  . Hyperglycemia 01/30/2017  . Arthritis of knee, degenerative 01/30/2017  . BPH with obstruction/lower urinary tract symptoms 02/03/2016  . Gross hematuria 02/03/2016  . Dyslipidemia 08/04/2015  . Skin lesions 07/14/2015  . Diverticulitis of large intestine without perforation or abscess without bleeding 06/30/2015  . Edema 06/30/2015  . OSA on CPAP 06/30/2015  . Kidney pain 06/30/2015   Pura Spice, PT, DPT # [phone] 02/15/2020, 11:16 AM  [facility] [address] [place], [place], [address] Phone: [phone]   Fax:  [phone]  Name: Tom Underwood MRN: [id number] Date of Birth: Feb 18, 1951

## 2020-02-18 ENCOUNTER — Other Ambulatory Visit: Payer: Self-pay

## 2020-02-18 ENCOUNTER — Ambulatory Visit: Payer: Medicare Other | Admitting: Physical Therapy

## 2020-02-18 ENCOUNTER — Encounter: Payer: Self-pay | Admitting: Physical Therapy

## 2020-02-18 DIAGNOSIS — R279 Unspecified lack of coordination: Secondary | ICD-10-CM

## 2020-02-18 DIAGNOSIS — R2689 Other abnormalities of gait and mobility: Secondary | ICD-10-CM | POA: Diagnosis not present

## 2020-02-18 DIAGNOSIS — Z23 Encounter for immunization: Secondary | ICD-10-CM | POA: Diagnosis not present

## 2020-02-18 DIAGNOSIS — M6281 Muscle weakness (generalized): Secondary | ICD-10-CM | POA: Diagnosis not present

## 2020-02-18 NOTE — Therapy (Signed)
[facility] [address] [place], [place], [address] Phone: [phone]   Fax:  [phone]  Physical Therapy Treatment  Patient Details  Name: Tom Underwood MRN: [id number] Date of Birth: Sep 30, 1951 Referring Provider (PT): Jennings Books, MD   Encounter Date: 02/18/2020  PT End of Session - 02/18/20 1016    Visit Number  16    Number of Visits  18    Date for PT Re-Evaluation  02/25/20    Authorization - Visit Number  7    Authorization - Number of Visits  10    PT Start Time  1025    PT Stop Time  1124    PT Time Calculation (min)  59 min    Equipment Utilized During Treatment  Gait belt    Activity Tolerance  Patient tolerated treatment well;Patient limited by fatigue;Patient limited by pain    Behavior During Therapy  [facility] for tasks assessed/performed       Past Medical History:  Diagnosis Date  . Decreased libido   . Diverticulitis   . HLD (hyperlipidemia)   . Hydronephrosis with renal and ureteral calculus obstruction   . Hydronephrosis with renal and ureteral calculus obstruction   . Hypogonadism in male   . Rhinitis, allergic   . Sleep apnea   . Ureteral stone     Past Surgical History:  Procedure Laterality Date  . EXTERNAL EAR SURGERY    . FINGER SURGERY    . KNEE SURGERY    . left eye      There were no vitals filed for this visit.  Subjective Assessment - 02/18/20 1016    Subjective  Pt. reports R shoulder has been hurting over past several days.  Pt. reports 5/10 R shoulder with overhead reaching, esp. shoulder abduction.  Pt. required assist from wife to don socks/shoes this morning.    Pertinent History  Pt. had a right sided stroke and loves to watch football and travel to the beach with his wife.    Limitations  Walking;Standing    How long can you walk comfortably?  0.2 miles before heaviness/fatigue    Patient Stated Goals  Increase walking distance, carry things with less pain in shoulder    Currently in Pain?  Yes    Pain Score  5     Pain Location  Shoulder    Pain Orientation  Right    Pain Descriptors / Indicators  Constant    Pain Onset  More than a month ago    Pain Onset  More than a month ago         TheraEx  Nustep Level610 min. B UE/LE (consistent cadence).  Averaging 82 STM.  Increase SOB (O2 sat. 96%, HR 96 bpm).     Seated R shoulder AROM: flexion/ abduction/ horizontal adduction (use of wand/ PT assist on R).  Seated RTB scap. Retraction. Shoulder extension 20x.  Standing 3" step touches forward/ lateral (no UE assist) 10x2 each.    6" step ups with //-bars assist (cuing to correct posture/ mirror feedback).    Neuromuscular Re-education  Walking in gym with cuing for R arm swing/ upright posture.  2 laps in hallway/ clinic before rest break.    Standing resisted walkingat Nautilus:(forward/backward/sidestepping) with no UE support, focusing on controlling the return(80#)-3x all planes.Extra time/ SBA for safety  Sit to stands (posture cuing) 10x.    No outside walking (raining today).     PT Long Term  Goals - 01/29/20 1002      PT LONG TERM GOAL #1   Title  Pt. will be able to ambulate with least assistive device for 0.8 mile with good gait mechanics in home community to improve walking endurance.    Baseline  Pt. reports he can walk 0.2 mile in his mobile home community    Time  4    Period  Weeks    Status  Partially Met    Target Date  02/25/20      PT LONG TERM GOAL #2   Title  Pt. will improve FOTO score to > 57 to show improvements in LE function for ADLs    Baseline  41    Time  4    Period  Weeks    Status  On-going    Target Date  02/25/20      PT LONG TERM GOAL #3   Title  Pt. will increase R LE strength to grossly 5/5 to improve gait mechanics and LE strength for ADLs    Baseline  R hip flexion 4-/5, knee flexion 4+/5, ankle everison 4+/5    Time  4    Period  Weeks    Status  Partially Met    Target  Date  02/25/20      PT LONG TERM GOAL #4   Title  Pt. will increase bilat UE flexion/abduction to 120 deg. for functional mobility for overhead reaching and bathing.    Baseline  R Flexion 79 with pain in deltoid, R Abduction 108; L flexion 131, L Abduction 109.  2/23: R sh. flexion >100 deg. (pain limited)    Time  4    Period  Weeks    Status  Partially Met    Target Date  02/25/20            Plan - 02/18/20 1016    Clinical Impression Statement  R shoulder pain limited with AROM/ overhead reaching tasks.  PT recommends continued use of R shoulder but avoid pain provoking movement patterns.  No LOB during tx. session but extra time/ focus during step touches without UE assist.  Increase in resistance with Nautilus walk outs without UE assist. Moderate fatigue t/o tx. session with fewer seated rest breaks today.  Pt. will continues with UE ex. program at home.    Personal Factors and Comorbidities  Comorbidity 2    Comorbidities  Hypertension, Obesity    Examination-Activity Limitations  Bed Mobility;Reach Overhead;Squat;Lift    Examination-Participation Restrictions  Community Activity;Yard Work    [employer]chant [employer] officer  Evolving/Moderate complexity    Clinical Decision Making  Moderate    Rehab Potential  Good    PT Frequency  2x / week    PT Duration  4 weeks    PT Treatment/Interventions  ADLs/Self Care Home Management;Gait training;Stair training;Functional mobility training;Therapeutic activities;Therapeutic exercise;Balance training;Neuromuscular re-education;Manual techniques;Patient/family education    PT Next Visit Plan  Resisted Walking/ dynamic balance tasks.    Consulted and Agree with Plan of Care  Patient       Patient will benefit from skilled therapeutic intervention in order to improve the following deficits and impairments:  Abnormal gait, Decreased activity tolerance, Decreased balance, Decreased coordination, Decreased mobility, Decreased  endurance, Decreased range of motion, Decreased strength, Difficulty walking, Dizziness, Impaired UE functional use, Pain  Visit Diagnosis: Other abnormalities of gait and mobility  Unspecified lack of coordination  Muscle weakness (generalized)     Problem List Patient Active Problem List  Diagnosis Date Noted  . Hemiparesis affecting right side as late effect of cerebrovascular accident ([place]) 08/13/2019  . Other sleep apnea 08/06/2019  . History of kidney stones 05/02/2018  . ED (erectile dysfunction) 05/02/2018  . Hyperglycemia 01/30/2017  . Arthritis of knee, degenerative 01/30/2017  . BPH with obstruction/lower urinary tract symptoms 02/03/2016  . Gross hematuria 02/03/2016  . Dyslipidemia 08/04/2015  . Skin lesions 07/14/2015  . Diverticulitis of large intestine without perforation or abscess without bleeding 06/30/2015  . Edema 06/30/2015  . OSA on CPAP 06/30/2015  . Kidney pain 06/30/2015   Pura Spice, PT, DPT # [id number] 02/18/2020, 12:22 PM  [facility] [address] [place], [place], [address] Phone: [phone]   Fax:  [phone]  Name: Tom Underwood MRN: [id number] Date of Birth: [birth year]

## 2020-02-20 ENCOUNTER — Encounter: Payer: Self-pay | Admitting: Physical Therapy

## 2020-02-20 ENCOUNTER — Other Ambulatory Visit: Payer: Self-pay

## 2020-02-20 ENCOUNTER — Ambulatory Visit: Payer: Medicare Other | Admitting: Physical Therapy

## 2020-02-20 DIAGNOSIS — R279 Unspecified lack of coordination: Secondary | ICD-10-CM | POA: Diagnosis not present

## 2020-02-20 DIAGNOSIS — M6281 Muscle weakness (generalized): Secondary | ICD-10-CM | POA: Diagnosis not present

## 2020-02-20 DIAGNOSIS — R2689 Other abnormalities of gait and mobility: Secondary | ICD-10-CM | POA: Diagnosis not present

## 2020-02-20 NOTE — Therapy (Signed)
[facility] [address] [place], [place], [address] Phone: [phone]   Fax:  [phone]  Physical Therapy Treatment  Patient Details  Name: Tom Underwood MRN: [id number] Date of Birth: [birth year] Referring Provider (PT): Jennings Books, MD   Encounter Date: 02/20/2020  PT End of Session - 02/20/20 1035    Visit Number  17    Number of Visits  18    Date for PT Re-Evaluation  02/25/20    Authorization - Visit Number  8    Authorization - Number of Visits  10    PT Start Time  1024    PT Stop Time  1125    PT Time Calculation (min)  61 min    Equipment Utilized During Treatment  Gait belt    Activity Tolerance  Patient tolerated treatment well;Patient limited by fatigue;Patient limited by pain    Behavior During Therapy  [facility] for tasks assessed/performed       Past Medical History:  Diagnosis Date  . Decreased libido   . Diverticulitis   . HLD (hyperlipidemia)   . Hydronephrosis with renal and ureteral calculus obstruction   . Hydronephrosis with renal and ureteral calculus obstruction   . Hypogonadism in male   . Rhinitis, allergic   . Sleep apnea   . Ureteral stone     Past Surgical History:  Procedure Laterality Date  . EXTERNAL EAR SURGERY    . FINGER SURGERY    . KNEE SURGERY    . left eye      There were no vitals filed for this visit.  Subjective Assessment - 02/20/20 1033    Subjective  Pt. states R shoulder is not hurting too bad today (2/10).  Pt. was able to put on shoes and tie them this morning.    Pertinent History  Pt. had a right sided stroke and loves to watch football and travel to the beach with his wife.    Limitations  Walking;Standing    How long can you walk comfortably?  0.2 miles before heaviness/fatigue    Patient Stated Goals  Increase walking distance, carry things with less pain in shoulder    Currently in Pain?  Yes    Pain Score  2     Pain Location  Shoulder    Pain  Orientation  Right    Pain Descriptors / Indicators  Aching    Pain Onset  More than a month ago    Pain Onset  More than a month ago         TheraEx  Nustep Level610 min. B UE/LE (consistent cadence).    Standing RTB scap. Retraction/ sh. Extension/ sh. Abduction (at 90 deg.)/ shoulder flexion (pain tolerable range)/ standing bicep curls (bilateral) 20x each.  Mirror feedback for posture feedback.    Standing marching/ lateral walking (no UE assist) 10x2 each in //-bars.    6" step ups with //-bars assist (cuing to correct posture/ mirror feedback).   Neuromuscular Re-education  Walking in gym with cuing for R arm swing/ upright posture.  4 laps in gym (using mirror for feedback).    Standing resisted walkingat Nautilus:(forward/backward/sidestepping) with no UE support, focusing on controlling the return(80#)-3x all planes.Extra time/ SBA for safety  Walking in hallway with head turns/ cuing for step pattern and length (while using SPC).     No outside walking (raining today).      PT Long Term Goals - 01/29/20 1002  PT LONG TERM GOAL #1   Title  Pt. will be able to ambulate with least assistive device for 0.8 mile with good gait mechanics in home community to improve walking endurance.    Baseline  Pt. reports he can walk 0.2 mile in his mobile home community    Time  4    Period  Weeks    Status  Partially Met    Target Date  02/25/20      PT LONG TERM GOAL #2   Title  Pt. will improve FOTO score to > 57 to show improvements in LE function for ADLs    Baseline  41    Time  4    Period  Weeks    Status  On-going    Target Date  02/25/20      PT LONG TERM GOAL #3   Title  Pt. will increase R LE strength to grossly 5/5 to improve gait mechanics and LE strength for ADLs    Baseline  R hip flexion 4-/5, knee flexion 4+/5, ankle everison 4+/5    Time  4    Period  Weeks    Status  Partially Met    Target Date  02/25/20      PT LONG TERM  GOAL #4   Title  Pt. will increase bilat UE flexion/abduction to 120 deg. for functional mobility for overhead reaching and bathing.    Baseline  R Flexion 79 with pain in deltoid, R Abduction 108; L flexion 131, L Abduction 109.  2/23: R sh. flexion >100 deg. (pain limited)    Time  4    Period  Weeks    Status  Partially Met    Target Date  02/25/20            Plan - 02/20/20 1035    Clinical Impression Statement  Pt. instructed in proper set up/ use of RTB for home UE ex. program.  Pt. completed 12 minutes of standing during UE ex. prior to first seated rest break.  Pt. requires cuing to increase R LE step length/ heel strike while walking with use of SPC, esp. whem stepping over thresholds/ outside terrain.  No LOB during tx. and pt. demonstrates ability to ambulate short distances without SPC but will continue to benefit from [facility] at home/ outside for safety.  Moderate generalized fatigue after tx. session.    Personal Factors and Comorbidities  Comorbidity 2    Comorbidities  Hypertension, Obesity    Examination-Activity Limitations  Bed Mobility;Reach Overhead;Squat;Lift    Examination-Participation Restrictions  Community Activity;Yard Work    [employer]chant [employer] officer  Evolving/Moderate complexity    Clinical Decision Making  Moderate    Rehab Potential  Good    PT Frequency  2x / week    PT Duration  4 weeks    PT Treatment/Interventions  ADLs/Self Care Home Management;Gait training;Stair training;Functional mobility training;Therapeutic activities;Therapeutic exercise;Balance training;Neuromuscular re-education;Manual techniques;Patient/family education    PT Next Visit Plan  Resisted Walking/ dynamic balance tasks.  CHECK GOALS.    Consulted and Agree with Plan of Care  Patient       Patient will benefit from skilled therapeutic intervention in order to improve the following deficits and impairments:  Abnormal gait, Decreased activity tolerance, Decreased balance,  Decreased coordination, Decreased mobility, Decreased endurance, Decreased range of motion, Decreased strength, Difficulty walking, Dizziness, Impaired UE functional use, Pain  Visit Diagnosis: Other abnormalities of gait and mobility  Unspecified lack of coordination  Muscle  weakness (generalized)     Problem List Patient Active Problem List   Diagnosis Date Noted  . Hemiparesis affecting right side as late effect of cerebrovascular accident ([place]) 08/13/2019  . Other sleep apnea 08/06/2019  . History of kidney stones 05/02/2018  . ED (erectile dysfunction) 05/02/2018  . Hyperglycemia 01/30/2017  . Arthritis of knee, degenerative 01/30/2017  . BPH with obstruction/lower urinary tract symptoms 02/03/2016  . Gross hematuria 02/03/2016  . Dyslipidemia 08/04/2015  . Skin lesions 07/14/2015  . Diverticulitis of large intestine without perforation or abscess without bleeding 06/30/2015  . Edema 06/30/2015  . OSA on CPAP 06/30/2015  . Kidney pain 06/30/2015   Pura Spice, PT, DPT # [id number] 02/20/2020, 11:58 AM  [facility] [address] [place], [place], [address] Phone: [phone]   Fax:  [phone]  Name: Tom Underwood MRN: [id number] Date of Birth: [birth year]

## 2020-02-24 ENCOUNTER — Ambulatory Visit: Payer: Medicare Other | Admitting: Physical Therapy

## 2020-02-24 ENCOUNTER — Encounter: Payer: Self-pay | Admitting: Physical Therapy

## 2020-02-24 ENCOUNTER — Other Ambulatory Visit: Payer: Self-pay

## 2020-02-24 DIAGNOSIS — M6281 Muscle weakness (generalized): Secondary | ICD-10-CM

## 2020-02-24 DIAGNOSIS — R279 Unspecified lack of coordination: Secondary | ICD-10-CM | POA: Diagnosis not present

## 2020-02-24 DIAGNOSIS — R2689 Other abnormalities of gait and mobility: Secondary | ICD-10-CM

## 2020-02-24 NOTE — Therapy (Signed)
[facility] [address] [place], [place], [address] Phone: [phone]   Fax:  [phone]  Physical Therapy Treatment  Patient Details  Name: Tom Underwood MRN: [id number] Date of Birth: Feb 14, 1951 Referring Provider (PT): Jennings Books, MD   Encounter Date: 02/24/2020  PT End of Session - 02/24/20 0902    Visit Number  18    Number of Visits  18    Date for PT Re-Evaluation  02/25/20    Authorization - Visit Number  9    Authorization - Number of Visits  10    PT Start Time  6468    PT Stop Time  0948    PT Time Calculation (min)  53 min    Equipment Utilized During Treatment  Gait belt    Activity Tolerance  Patient tolerated treatment well;Patient limited by fatigue;Patient limited by pain    Behavior During Therapy  [facility] for tasks assessed/performed       Past Medical History:  Diagnosis Date  . Decreased libido   . Diverticulitis   . HLD (hyperlipidemia)   . Hydronephrosis with renal and ureteral calculus obstruction   . Hydronephrosis with renal and ureteral calculus obstruction   . Hypogonadism in male   . Rhinitis, allergic   . Sleep apnea   . Ureteral stone     Past Surgical History:  Procedure Laterality Date  . EXTERNAL EAR SURGERY    . FINGER SURGERY    . KNEE SURGERY    . left eye      There were no vitals filed for this visit.  Subjective Assessment - 02/24/20 0858    Subjective  Pt. reports R shoulder "feels tight".  Pt. had difficulty stirring biscuit mix with R UE.  No falls or LOB reports over weekend.    Pertinent History  Pt. had a right sided stroke and loves to watch football and travel to the beach with his wife.    Limitations  Walking;Standing    How long can you walk comfortably?  0.2 miles before heaviness/fatigue    Patient Stated Goals  Increase walking distance, carry things with less pain in shoulder    Currently in Pain?  Yes    Pain Score  1     Pain Location  Shoulder     Pain Orientation  Right    Pain Descriptors / Indicators  Aching    Pain Onset  More than a month ago    Pain Onset  More than a month ago        FOTO: 44   TheraEx:  Nustep Level610 min. B UE/LE (consistent cadence).   Standing partial lunges with hold in front of //-bars/ mirror for posture feedback.  10x L/R  Standing marching/ lateral walking (no UE assist) 10x2 each in //-bars.  Seated/standing Nautilus ex.: lat. Pull down 40#/ tricep extension 30#/ scap. Retraction 40# 20x each.  Standing 4# bicep curls with cuing to correct posture.      Recip. step ups with handrail assist (cuing to correct posture/ mirror feedback).   Neuromuscular Re-education  Walking in gym withcuing for R arm swing/ upright posture.4 laps in hallway.With and without SPC.  Standing resisted walkingat Nautilus:(forward/backward/sidestepping) with no UE support, focusing on controlling the return(80#)-3x all planes.Extra time/ SBA for safety      PT Long Term Goals - 02/24/20 1858      PT LONG TERM GOAL #1   Title  Pt. will be  able to ambulate with least assistive device for 0.8 mile with good gait mechanics in home community to improve walking endurance.    Baseline  Pt. reports he can walk 0.2 mile in his mobile home community    Time  4    Period  Weeks    Status  Partially Met    Target Date  02/25/20      PT LONG TERM GOAL #2   Title  Pt. will improve FOTO score to > 57 to show improvements in LE function for ADLs    Baseline  41.  3/22: 44    Time  4    Period  Weeks    Status  Not Met    Target Date  02/25/20      PT LONG TERM GOAL #3   Title  Pt. will increase R LE strength to grossly 5/5 to improve gait mechanics and LE strength for ADLs    Baseline  R hip flexion 4-/5, knee flexion 4+/5, ankle everison 4+/5    Time  4    Period  Weeks    Status  Partially Met    Target Date  02/25/20      PT LONG TERM GOAL #4   Title  Pt. will increase bilat UE  flexion/abduction to 120 deg. for functional mobility for overhead reaching and bathing.    Baseline  R Flexion 79 with pain in deltoid, R Abduction 108; L flexion 131, L Abduction 109.  2/23: R sh. flexion >100 deg. (pain limited)    Time  4    Period  Weeks    Status  Partially Met    Target Date  02/25/20            Plan - 02/24/20 0903    Clinical Impression Statement  Pt. has generalized fatigue t/o tx. session with standing/ resisted ther.exKermit Balo sh./hand ROM during Nautilus resisted ex. with use of handles.  Pt. requires several seated rest breaks t/o tx. session due to increase SOB/ fatigue.  PT will update goals and discuss recert next tx. session.    Personal Factors and Comorbidities  Comorbidity 2    Comorbidities  Hypertension, Obesity    Examination-Activity Limitations  Bed Mobility;Reach Overhead;Squat;Lift    Examination-Participation Restrictions  Community Activity;Yard Work    [employer] officer  Evolving/Moderate complexity    Clinical Decision Making  Moderate    Rehab Potential  Good    PT Frequency  2x / week    PT Duration  4 weeks    PT Treatment/Interventions  ADLs/Self Care Home Management;Gait training;Stair training;Functional mobility training;Therapeutic activities;Therapeutic exercise;Balance training;Neuromuscular re-education;Manual techniques;Patient/family education    PT Next Visit Plan  Resisted Walking/ dynamic balance tasks.  Reassess goals/ discuss recert.    Consulted and Agree with Plan of Care  Patient       Patient will benefit from skilled therapeutic intervention in order to improve the following deficits and impairments:  Abnormal gait, Decreased activity tolerance, Decreased balance, Decreased coordination, Decreased mobility, Decreased endurance, Decreased range of motion, Decreased strength, Difficulty walking, Dizziness, Impaired UE functional use, Pain  Visit Diagnosis: Other abnormalities of gait and  mobility  Unspecified lack of coordination  Muscle weakness (generalized)     Problem List Patient Active Problem List   Diagnosis Date Noted  . Hemiparesis affecting right side as late effect of cerebrovascular accident ([place]) 08/13/2019  . Other sleep apnea 08/06/2019  . History of kidney stones 05/02/2018  .  ED (erectile dysfunction) 05/02/2018  . Hyperglycemia 01/30/2017  . Arthritis of knee, degenerative 01/30/2017  . BPH with obstruction/lower urinary tract symptoms 02/03/2016  . Gross hematuria 02/03/2016  . Dyslipidemia 08/04/2015  . Skin lesions 07/14/2015  . Diverticulitis of large intestine without perforation or abscess without bleeding 06/30/2015  . Edema 06/30/2015  . OSA on CPAP 06/30/2015  . Kidney pain 06/30/2015   Pura Spice, PT, DPT # [id number] 02/24/2020, 7:00 PM  [facility] [address] [place], [place], [address] Phone: [phone]   Fax:  [phone]  Name: Tom Underwood MRN: [id number] Date of Birth: [birth year]

## 2020-02-25 ENCOUNTER — Encounter: Payer: Medicare Other | Admitting: Physical Therapy

## 2020-02-27 ENCOUNTER — Encounter: Payer: Self-pay | Admitting: Physical Therapy

## 2020-02-27 ENCOUNTER — Other Ambulatory Visit: Payer: Self-pay

## 2020-02-27 ENCOUNTER — Ambulatory Visit: Payer: Medicare Other | Admitting: Physical Therapy

## 2020-02-27 DIAGNOSIS — R279 Unspecified lack of coordination: Secondary | ICD-10-CM | POA: Diagnosis not present

## 2020-02-27 DIAGNOSIS — M6281 Muscle weakness (generalized): Secondary | ICD-10-CM

## 2020-02-27 DIAGNOSIS — R2689 Other abnormalities of gait and mobility: Secondary | ICD-10-CM | POA: Diagnosis not present

## 2020-02-27 NOTE — Therapy (Addendum)
[facility] [address] [place], [place], [address] Phone: [phone]   Fax:  [phone]  Physical Therapy Treatment  Patient Details  Name: Tom Underwood MRN: [id number] Date of Birth: November 15, 1951 Referring Provider (PT): Jennings Books, MD   Encounter Date: 02/27/2020    PT End of Session - 02/29/20 0941    Visit Number  19    Number of Visits  27    Date for PT Re-Evaluation  03/26/20    Authorization - Visit Number  1    Authorization - Number of Visits  10    PT Start Time  0941    PT Stop Time  1033    PT Time Calculation (min)  52 min    Equipment Utilized During Treatment  Gait belt    Activity Tolerance  Patient tolerated treatment well;Patient limited by fatigue;Patient limited by pain    Behavior During Therapy  [facility] for tasks assessed/performed       Past Medical History:  Diagnosis Date  . Decreased libido   . Diverticulitis   . HLD (hyperlipidemia)   . Hydronephrosis with renal and ureteral calculus obstruction   . Hydronephrosis with renal and ureteral calculus obstruction   . Hypogonadism in male   . Rhinitis, allergic   . Sleep apnea   . Ureteral stone     Past Surgical History:  Procedure Laterality Date  . EXTERNAL EAR SURGERY    . FINGER SURGERY    . KNEE SURGERY    . left eye      There were no vitals filed for this visit.  Subjective Assessment - 02/29/20 0939    Subjective  No new complaints.  Pt. staying active with daily walking/ HEP.    Pertinent History  Pt. had a right sided stroke and loves to watch football and travel to the beach with his wife.    Limitations  Walking;Standing    How long can you walk comfortably?  0.2 miles before heaviness/fatigue    Patient Stated Goals  Increase walking distance, carry things with less pain in shoulder    Currently in Pain?  No/denies    Pain Onset  More than a month ago    Pain Onset  More than a month ago       [facility] PT Assessment -  03/07/20 0001      Assessment   Medical Diagnosis  Impaired Gait    Referring Provider (PT)  Jennings Books, MD    Onset Date/Surgical Date  07/23/19    Hand Dominance  Right      Balance Screen   Has the patient fallen in the past 6 months  No      [place] residence      Prior Function   Level of Independence  Independent            TheraEx:  Nustep Level610 min. B UE/LE (consistent cadence).  Standing partial lunges with hold in front of //-bars/ mirror for posture feedback.  10x L/R  Step ups/ downs with 1 UE progressing to no UE assist (mirror/ verbal feedback)- 10x2  Standing Nautilus ex.: lat. Pull down 40#/ tricep extension 30#/ scap. Retraction 40# 20x each. Standing 4# bicep curls with cuing to correct posture.      Neuromuscular Re-education  Walking in gym withcuing for R arm swing/ upright posture.4laps in hallway (no SPC).    Standing resisted walkingat Nautilus:(forward/backward/sidestepping)  with no UE support, focusing on controlling the return(80#)-3x all planes.Extra time/ SBA for safety    PT Long Term Goals - 02/27/20 0951      PT LONG TERM GOAL #1   Title  Pt. will be able to ambulate with least assistive device for 0.8 mile with good gait mechanics in home community to improve walking endurance.    Baseline  Pt. reports he can walk 0.2 mile in his mobile home community.  3/25: increase walking endurance but not to baseline.    Time  4    Period  Weeks    Status  Partially Met    Target Date  03/26/20      PT LONG TERM GOAL #2   Title  Pt. will improve FOTO score to > 57 to show improvements in LE function for ADLs    Baseline  41.  3/22: 44    Time  4    Period  Weeks    Status  Not Met    Target Date  03/26/20      PT LONG TERM GOAL #3   Title  Pt. will increase R LE strength to grossly 5/5 to improve gait mechanics and LE strength for ADLs    Baseline  R hip flexion  4-/5, knee flexion 4+/5, ankle everison 4+/5.  3/25:  hip flexion 4/5 MMT    Time  4    Period  Weeks    Status  Partially Met    Target Date  03/26/20      PT LONG TERM GOAL #4   Title  Pt. will increase bilat UE flexion/abduction to 120 deg. for functional mobility for overhead reaching and bathing.    Baseline  R Flexion 79 with pain in deltoid, R Abduction 108; L flexion 131, L Abduction 109.  2/23: R sh. flexion >100 deg. (pain limited).  3/25:  R sh. flexion 134 deg./ abduction 114 deg.  L sh. flexion 130 deg./ abduction 128 deg. (consistent improvement).    Time  4    Period  Weeks    Status  Achieved    Target Date  02/27/20            Plan - 03/07/20 1817    Clinical Impression Statement  Pt. has shown slow but consistent progress towards all PT goals.  Pt. ambulates short distances with no assistive device and consistent step pattern/ heel strike.  Pt. contiues to benefit from use of SPC with initial steps after standing/ outside walking/ turning.  Pt. reports decrease heaviness in R LE and completing step ups/ downs with limited to no UE assist.  R shoulder pain/ discomfort remains with overhead reaching/ repetitive tasks.  Pt. has shown an improvement in overall walking/ tx. endurance but increase SOB noted. Pt. will continue to benefit from skilled PT services to improve generalized strengthening/ conditioning to improve safety/ independence with functional mobility.    Personal Factors and Comorbidities  Comorbidity 2    Comorbidities  Hypertension, Obesity    Examination-Activity Limitations  Bed Mobility;Reach Overhead;Squat;Lift    Examination-Participation Restrictions  Community Activity;Yard Work    [employer]chant [employer] officer  Evolving/Moderate complexity    Clinical Decision Making  Moderate    Rehab Potential  Good    PT Frequency  2x / week    PT Duration  4 weeks    PT Treatment/Interventions  ADLs/Self Care Home Management;Gait training;Stair  training;Functional mobility training;Therapeutic activities;Therapeutic exercise;Balance training;Neuromuscular re-education;Manual techniques;Patient/family education  PT Next Visit Plan  Resisted Walking/ dynamic balance tasks.  Reassess R shoulder ROM.    Consulted and Agree with Plan of Care  Patient       Patient will benefit from skilled therapeutic intervention in order to improve the following deficits and impairments:  Abnormal gait, Decreased activity tolerance, Decreased balance, Decreased coordination, Decreased mobility, Decreased endurance, Decreased range of motion, Decreased strength, Difficulty walking, Dizziness, Impaired UE functional use, Pain  Visit Diagnosis: Other abnormalities of gait and mobility  Unspecified lack of coordination  Muscle weakness (generalized)     Problem List Patient Active Problem List   Diagnosis Date Noted  . Hemiparesis affecting right side as late effect of cerebrovascular accident ([place]) 08/13/2019  . Other sleep apnea 08/06/2019  . History of kidney stones 05/02/2018  . ED (erectile dysfunction) 05/02/2018  . Hyperglycemia 01/30/2017  . Arthritis of knee, degenerative 01/30/2017  . BPH with obstruction/lower urinary tract symptoms 02/03/2016  . Gross hematuria 02/03/2016  . Dyslipidemia 08/04/2015  . Skin lesions 07/14/2015  . Diverticulitis of large intestine without perforation or abscess without bleeding 06/30/2015  . Edema 06/30/2015  . OSA on CPAP 06/30/2015  . Kidney pain 06/30/2015   Pura Spice, PT, DPT # [id number] 03/07/2020, 6:38 PM  [facility] [address] [place], [place], [address] Phone: [phone]   Fax:  [phone]  Name: Tom Underwood MRN: [id number] Date of Birth: 03-16-1951

## 2020-03-02 ENCOUNTER — Other Ambulatory Visit: Payer: Self-pay

## 2020-03-02 ENCOUNTER — Ambulatory Visit: Payer: Medicare Other | Admitting: Physical Therapy

## 2020-03-02 DIAGNOSIS — R279 Unspecified lack of coordination: Secondary | ICD-10-CM

## 2020-03-02 DIAGNOSIS — R2689 Other abnormalities of gait and mobility: Secondary | ICD-10-CM

## 2020-03-02 DIAGNOSIS — M6281 Muscle weakness (generalized): Secondary | ICD-10-CM

## 2020-03-04 NOTE — Therapy (Addendum)
[facility] [address] [place], [place], [address] Phone: [phone]   Fax:  [phone]  Physical Therapy Treatment  Patient Details  Name: Tom Underwood MRN: [id number] Date of Birth: 1951-06-13 Referring Provider (PT): Jennings Books, MD   Encounter Date: 03/02/2020    PT End of Session - 03/04/20 1422    Visit Number  20    Number of Visits  27    Date for PT Re-Evaluation  03/26/20    Authorization - Visit Number  2    Authorization - Number of Visits  10    PT Start Time  0900    PT Stop Time  0951    PT Time Calculation (min)  51 min    Equipment Utilized During Treatment  Gait belt    Activity Tolerance  Patient tolerated treatment well;Patient limited by fatigue;Patient limited by pain    Behavior During Therapy  [facility] for tasks assessed/performed        Past Medical History:  Diagnosis Date  . Decreased libido   . Diverticulitis   . HLD (hyperlipidemia)   . Hydronephrosis with renal and ureteral calculus obstruction   . Hydronephrosis with renal and ureteral calculus obstruction   . Hypogonadism in male   . Rhinitis, allergic   . Sleep apnea   . Ureteral stone     Past Surgical History:  Procedure Laterality Date  . EXTERNAL EAR SURGERY    . FINGER SURGERY    . KNEE SURGERY    . left eye      There were no vitals filed for this visit.  Subjective Assessment - 03/07/20 1845    Subjective  Pt. c/o R shoulder discomfort this morning, especially with overhead reaching.  No falls or LOB this weekend.  Pt. able to attend [employer] football game.    Pertinent History  Pt. had a right sided stroke and loves to watch football and travel to the beach with his wife.    Limitations  Walking;Standing    How long can you walk comfortably?  0.2 miles before heaviness/fatigue    Patient Stated Goals  Increase walking distance, carry things with less pain in shoulder    Currently in Pain?  Yes    Pain  Score  2     Pain Location  Shoulder    Pain Orientation  Right    Pain Descriptors / Indicators  Aching    Pain Onset  More than a month ago    Pain Onset  More than a month ago        There.ex.:  Nustep L6 10 min. B UE/LE (no breaks). 5# seated marching/ heel raises/ LAQ 20x each.  Standing hip extension/ knee flexion/ heel raises 20x. 5# walking in //-bars forward/ backwards/ lateral with limited UE assist 6x.  (increase SOB) 6" step touches/ step ups/ downs 10x Standing wt. Wand shoulder flexion/ abduction/ extension 20x each (mirror feedback)- limited by R sh. Pain.  Neuro.mm.:  Walking over green hurdles in //-bars with no UE assist Step over 3" plinth with heel strike Walking around PT clinic/ outside working on maintaining BOS/ step pattern/ posture/ cuing for arm swing. Step up/ down curb with SPC assist in tight space to get into car      PT Long Term Goals - 02/27/20 0951      PT LONG TERM GOAL #1   Title  Pt. will be able to ambulate with least assistive  device for 0.8 mile with good gait mechanics in home community to improve walking endurance.    Baseline  Pt. reports he can walk 0.2 mile in his mobile home community.  3/25: increase walking endurance but not to baseline.    Time  4    Period  Weeks    Status  Partially Met    Target Date  03/26/20      PT LONG TERM GOAL #2   Title  Pt. will improve FOTO score to > 57 to show improvements in LE function for ADLs    Baseline  41.  3/22: 44    Time  4    Period  Weeks    Status  Not Met    Target Date  03/26/20      PT LONG TERM GOAL #3   Title  Pt. will increase R LE strength to grossly 5/5 to improve gait mechanics and LE strength for ADLs    Baseline  R hip flexion 4-/5, knee flexion 4+/5, ankle everison 4+/5.  3/25:  hip flexion 4/5 MMT    Time  4    Period  Weeks    Status  Partially Met    Target Date  03/26/20      PT LONG TERM GOAL #4   Title  Pt. will increase bilat UE flexion/abduction to  120 deg. for functional mobility for overhead reaching and bathing.    Baseline  R Flexion 79 with pain in deltoid, R Abduction 108; L flexion 131, L Abduction 109.  2/23: R sh. flexion >100 deg. (pain limited).  3/25:  R sh. flexion 134 deg./ abduction 114 deg.  L sh. flexion 130 deg./ abduction 128 deg. (consistent improvement).    Time  4    Period  Weeks    Status  Achieved    Target Date  02/27/20            Plan - 03/07/20 1847    Clinical Impression Statement  Pt. able to ambulate with increase cadence and distance around PT clinic without use of SPC.  Pt. able to complete standing ther.ex. with UE but limited by R shoulder pain.  Increase LE muscle fatigue with LE resisted ther.ex./ walk outs but no LOB.  Pt. remains motivated to improve walking and overall independence.    Personal Factors and Comorbidities  Comorbidity 2    Comorbidities  Hypertension, Obesity    Examination-Activity Limitations  Bed Mobility;Reach Overhead;Squat;Lift    Examination-Participation Restrictions  Community Activity;Yard Work    [employer] officer  Evolving/Moderate complexity    Clinical Decision Making  Moderate    Rehab Potential  Good    PT Frequency  2x / week    PT Duration  4 weeks    PT Treatment/Interventions  ADLs/Self Care Home Management;Gait training;Stair training;Functional mobility training;Therapeutic activities;Therapeutic exercise;Balance training;Neuromuscular re-education;Manual techniques;Patient/family education    PT Next Visit Plan  Resisted Walking/ dynamic balance tasks.  Reassess R shoulder ROM.    Consulted and Agree with Plan of Care  Patient       Patient will benefit from skilled therapeutic intervention in order to improve the following deficits and impairments:  Abnormal gait, Decreased activity tolerance, Decreased balance, Decreased coordination, Decreased mobility, Decreased endurance, Decreased range of motion, Decreased strength, Difficulty  walking, Dizziness, Impaired UE functional use, Pain  Visit Diagnosis: Other abnormalities of gait and mobility  Unspecified lack of coordination  Muscle weakness (generalized)     Problem List Patient  Active Problem List   Diagnosis Date Noted  . Hemiparesis affecting right side as late effect of cerebrovascular accident ([place]) 08/13/2019  . Other sleep apnea 08/06/2019  . History of kidney stones 05/02/2018  . ED (erectile dysfunction) 05/02/2018  . Hyperglycemia 01/30/2017  . Arthritis of knee, degenerative 01/30/2017  . BPH with obstruction/lower urinary tract symptoms 02/03/2016  . Gross hematuria 02/03/2016  . Dyslipidemia 08/04/2015  . Skin lesions 07/14/2015  . Diverticulitis of large intestine without perforation or abscess without bleeding 06/30/2015  . Edema 06/30/2015  . OSA on CPAP 06/30/2015  . Kidney pain 06/30/2015   Pura Spice, PT, DPT # [id number] 03/07/2020, 6:58 PM  [facility] [address] [place], [place], [address] Phone: [phone]   Fax:  [phone]  Name: Tom Underwood MRN: [id number] Date of Birth: [birth year]

## 2020-03-05 ENCOUNTER — Ambulatory Visit: Payer: Medicare Other | Attending: Neurology | Admitting: Physical Therapy

## 2020-03-05 ENCOUNTER — Other Ambulatory Visit: Payer: Self-pay

## 2020-03-05 ENCOUNTER — Encounter: Payer: Self-pay | Admitting: Physical Therapy

## 2020-03-05 DIAGNOSIS — M6281 Muscle weakness (generalized): Secondary | ICD-10-CM | POA: Insufficient documentation

## 2020-03-05 DIAGNOSIS — R279 Unspecified lack of coordination: Secondary | ICD-10-CM | POA: Insufficient documentation

## 2020-03-05 DIAGNOSIS — R2689 Other abnormalities of gait and mobility: Secondary | ICD-10-CM

## 2020-03-07 NOTE — Therapy (Signed)
[facility] [address] [place], [place], [address] Phone: [phone]   Fax:  [phone]  Physical Therapy Treatment  Patient Details  Name: Tom Underwood MRN: [id number] Date of Birth: May 11, 1951 Referring Provider (PT): Jennings Books, MD   Encounter Date: 03/05/2020  PT End of Session - 03/07/20 1916    Visit Number  21    Number of Visits  27    Date for PT Re-Evaluation  03/26/20    Authorization - Visit Number  3    Authorization - Number of Visits  10    PT Start Time  0929    PT Stop Time  1019    PT Time Calculation (min)  50 min    Equipment Utilized During Treatment  Gait belt    Activity Tolerance  Patient tolerated treatment well;Patient limited by fatigue;Patient limited by pain    Behavior During Therapy  [facility] for tasks assessed/performed       Past Medical History:  Diagnosis Date  . Decreased libido   . Diverticulitis   . HLD (hyperlipidemia)   . Hydronephrosis with renal and ureteral calculus obstruction   . Hydronephrosis with renal and ureteral calculus obstruction   . Hypogonadism in male   . Rhinitis, allergic   . Sleep apnea   . Ureteral stone     Past Surgical History:  Procedure Laterality Date  . EXTERNAL EAR SURGERY    . FINGER SURGERY    . KNEE SURGERY    . left eye      There were no vitals filed for this visit.  Subjective Assessment - 03/07/20 1912    Subjective  Pt. reports 2/10 R shoulder discomfort while on Nustep and 7/10 R sh. pain with AA/PROM flexion/ abduciton >120 deg.    Pertinent History  Pt. had a right sided stroke and loves to watch football and travel to the beach with his wife.    Limitations  Walking;Standing    How long can you walk comfortably?  0.2 miles before heaviness/fatigue    Patient Stated Goals  Increase walking distance, carry things with less pain in shoulder    Currently in Pain?  Yes    Pain Score  2     Pain Location  Shoulder    Pain  Orientation  Right    Pain Descriptors / Indicators  Aching    Pain Onset  More than a month ago    Pain Onset  More than a month ago         There.ex.:  Nustep L6 10 min. B UE/LE (no breaks). 5# seated marching/ heel raises/ LAQ 20x each.  Standing hip extension/ knee flexion/ heel raises 20x. 5# walking in //-bars forward/ backwards/ lateral with limited UE assist 6x.  (increase SOB) Standing wt. Wand shoulder flexion/ abduction/ extension 20x each (mirror feedback)- limited by R sh. Pain. Reviewed HEP  Neuro.mm.:  Walking over green hurdles in //-bars with no UE assist.  No mistakes and consistent hip flexion/ step pattern.  Obstacle course: step ups/ overs/ Airex/ cone taps/ maneuvering  Walking around PT clinic/ outside working on maintaining BOS/ step pattern/ posture/ cuing for arm swing. Agility ladder with consistent step pattern (blue line marker).      PT Long Term Goals - 02/27/20 0951      PT LONG TERM GOAL #1   Title  Pt. will be able to ambulate with least assistive device for 0.8 mile with good  gait mechanics in home community to improve walking endurance.    Baseline  Pt. reports he can walk 0.2 mile in his mobile home community.  3/25: increase walking endurance but not to baseline.    Time  4    Period  Weeks    Status  Partially Met    Target Date  03/26/20      PT LONG TERM GOAL #2   Title  Pt. will improve FOTO score to > 57 to show improvements in LE function for ADLs    Baseline  41.  3/22: 44    Time  4    Period  Weeks    Status  Not Met    Target Date  03/26/20      PT LONG TERM GOAL #3   Title  Pt. will increase R LE strength to grossly 5/5 to improve gait mechanics and LE strength for ADLs    Baseline  R hip flexion 4-/5, knee flexion 4+/5, ankle everison 4+/5.  3/25:  hip flexion 4/5 MMT    Time  4    Period  Weeks    Status  Partially Met    Target Date  03/26/20      PT LONG TERM GOAL #4   Title  Pt. will increase bilat UE  flexion/abduction to 120 deg. for functional mobility for overhead reaching and bathing.    Baseline  R Flexion 79 with pain in deltoid, R Abduction 108; L flexion 131, L Abduction 109.  2/23: R sh. flexion >100 deg. (pain limited).  3/25:  R sh. flexion 134 deg./ abduction 114 deg.  L sh. flexion 130 deg./ abduction 128 deg. (consistent improvement).    Time  4    Period  Weeks    Status  Achieved    Target Date  02/27/20            Plan - 03/07/20 1917    Clinical Impression Statement  Moderate R shoulder joint stiffness/ pain with overhead reaching/ AAROM with PT assist.  Pt. applied icy hot to L knee prior to tx. session.  Pt. reports no heaviness in R LE but still has heaviness in R foot.  Pt. benefits from use of SPC during obstacle course, esp. cone taps/ step overs.  Pt. complete green hurdle step overs with no mistakes and no UE assist on //-bars.  Pt. worked really hard during tx. session and pushed self to complete increase reps. on Nautilus walkouts.    Personal Factors and Comorbidities  Comorbidity 2    Comorbidities  Hypertension, Obesity    Examination-Activity Limitations  Bed Mobility;Reach Overhead;Squat;Lift    Examination-Participation Restrictions  Community Activity;Yard Work    [employer] officer  Evolving/Moderate complexity    Clinical Decision Making  Moderate    Rehab Potential  Good    PT Frequency  2x / week    PT Duration  4 weeks    PT Treatment/Interventions  ADLs/Self Care Home Management;Gait training;Stair training;Functional mobility training;Therapeutic activities;Therapeutic exercise;Balance training;Neuromuscular re-education;Manual techniques;Patient/family education    PT Next Visit Plan  Resisted Walking/ dynamic balance tasks.  Reassess R shoulder ROM.    Consulted and Agree with Plan of Care  Patient       Patient will benefit from skilled therapeutic intervention in order to improve the following deficits and impairments:   Abnormal gait, Decreased activity tolerance, Decreased balance, Decreased coordination, Decreased mobility, Decreased endurance, Decreased range of motion, Decreased strength, Difficulty walking, Dizziness, Impaired UE  functional use, Pain  Visit Diagnosis: Other abnormalities of gait and mobility  Unspecified lack of coordination  Muscle weakness (generalized)     Problem List Patient Active Problem List   Diagnosis Date Noted  . Hemiparesis affecting right side as late effect of cerebrovascular accident ([place]) 08/13/2019  . Other sleep apnea 08/06/2019  . History of kidney stones 05/02/2018  . ED (erectile dysfunction) 05/02/2018  . Hyperglycemia 01/30/2017  . Arthritis of knee, degenerative 01/30/2017  . BPH with obstruction/lower urinary tract symptoms 02/03/2016  . Gross hematuria 02/03/2016  . Dyslipidemia 08/04/2015  . Skin lesions 07/14/2015  . Diverticulitis of large intestine without perforation or abscess without bleeding 06/30/2015  . Edema 06/30/2015  . OSA on CPAP 06/30/2015  . Kidney pain 06/30/2015   Pura Spice, PT, DPT # [id number] 03/07/2020, 7:24 PM  [facility] [address] [place], [place], [address] Phone: [phone]   Fax:  [phone]  Name: Tom Underwood MRN: [id number] Date of Birth: 05-09-1951

## 2020-03-07 NOTE — Addendum Note (Signed)
Addended by: Cammie Mcgee on: 03/07/2020 06:43 PM   Modules accepted: Orders

## 2020-03-10 ENCOUNTER — Other Ambulatory Visit: Payer: Self-pay

## 2020-03-10 ENCOUNTER — Encounter: Payer: Self-pay | Admitting: Physical Therapy

## 2020-03-10 ENCOUNTER — Ambulatory Visit: Payer: Medicare Other | Admitting: Physical Therapy

## 2020-03-10 DIAGNOSIS — R2689 Other abnormalities of gait and mobility: Secondary | ICD-10-CM | POA: Diagnosis not present

## 2020-03-10 DIAGNOSIS — M6281 Muscle weakness (generalized): Secondary | ICD-10-CM

## 2020-03-10 DIAGNOSIS — R279 Unspecified lack of coordination: Secondary | ICD-10-CM | POA: Diagnosis not present

## 2020-03-12 ENCOUNTER — Other Ambulatory Visit: Payer: Self-pay

## 2020-03-12 ENCOUNTER — Ambulatory Visit: Payer: Medicare Other | Admitting: Physical Therapy

## 2020-03-12 DIAGNOSIS — M6281 Muscle weakness (generalized): Secondary | ICD-10-CM

## 2020-03-12 DIAGNOSIS — R279 Unspecified lack of coordination: Secondary | ICD-10-CM

## 2020-03-12 DIAGNOSIS — R2689 Other abnormalities of gait and mobility: Secondary | ICD-10-CM | POA: Diagnosis not present

## 2020-03-14 ENCOUNTER — Encounter: Payer: Self-pay | Admitting: Physical Therapy

## 2020-03-14 NOTE — Therapy (Signed)
[place] [facility] [address] [place], [place], [address] Phone: [phone]   Fax:  [phone]  Physical Therapy Treatment  Patient Details  Name: Tom Underwood MRN: [id number] Date of Birth: [birth year] Referring Provider (PT): Jennings Books, MD   Encounter Date: 03/12/2020  PT End of Session - 03/14/20 1423    Visit Number  23    Number of Visits  27    Date for PT Re-Evaluation  03/26/20    Authorization - Visit Number  5    Authorization - Number of Visits  10    PT Start Time  [phone]    PT Stop Time  1036    PT Time Calculation (min)  53 min    Equipment Utilized During Treatment  Gait belt    Activity Tolerance  Patient tolerated treatment well;Patient limited by fatigue;Patient limited by pain    Behavior During Therapy  [facility] for tasks assessed/performed       Past Medical History:  Diagnosis Date  . Decreased libido   . Diverticulitis   . HLD (hyperlipidemia)   . Hydronephrosis with renal and ureteral calculus obstruction   . Hydronephrosis with renal and ureteral calculus obstruction   . Hypogonadism in male   . Rhinitis, allergic   . Sleep apnea   . Ureteral stone     Past Surgical History:  Procedure Laterality Date  . EXTERNAL EAR SURGERY    . FINGER SURGERY    . KNEE SURGERY    . left eye      There were no vitals filed for this visit.  Subjective Assessment - 03/14/20 1421    Subjective  Pt. states soreness in B shoulder but pain is much better today as compared to last tx.    Pertinent History  Pt. had a right sided stroke and loves to watch football and travel to the beach with his wife.    Limitations  Walking;Standing    How long can you walk comfortably?  0.2 miles before heaviness/fatigue    Patient Stated Goals  Increase walking distance, carry things with less pain in shoulder    Currently in Pain?  Yes    Pain Score  2     Pain Location  Shoulder    Pain Orientation  Right;Left    Pain Onset   More than a month ago    Pain Onset  More than a month ago         There.ex.:  Discuss shoulder ROM/ use of R shoulder with daily activity Nustep L5 10 min. B UE/LE today (no increase pain) Standing hip flexion/ abduction/ extension/ heel raises/ knee flexion 20x. Walking partial lunges in //-bars with focus on knee in midline 10x2.  Neuro:  Standing step touches L/R 10x each (no UE assist) Walking in clinic/ hallway 3 laps without assistive device working on consistent arm swing Walking outside on grass/ curbs/ ramp without assistive device.   Working on progressing off the [facility] with mod. [place] safely.      PT Long Term Goals - 02/27/20 0951      PT LONG TERM GOAL #1   Title  Pt. will be able to ambulate with least assistive device for 0.8 mile with good gait mechanics in home community to improve walking endurance.    Baseline  Pt. reports he can walk 0.2 mile in his mobile home community.  3/25: increase walking endurance but not to baseline.    Time  4    Period  Weeks    Status  Partially Met    Target Date  03/26/20      PT LONG TERM GOAL #2   Title  Pt. will improve FOTO score to > 57 to show improvements in LE function for ADLs    Baseline  41.  3/22: 44    Time  4    Period  Weeks    Status  Not Met    Target Date  03/26/20      PT LONG TERM GOAL #3   Title  Pt. will increase R LE strength to grossly 5/5 to improve gait mechanics and LE strength for ADLs    Baseline  R hip flexion 4-/5, knee flexion 4+/5, ankle everison 4+/5.  3/25:  hip flexion 4/5 MMT    Time  4    Period  Weeks    Status  Partially Met    Target Date  03/26/20      PT LONG TERM GOAL #4   Title  Pt. will increase bilat UE flexion/abduction to 120 deg. for functional mobility for overhead reaching and bathing.    Baseline  R Flexion 79 with pain in deltoid, R Abduction 108; L flexion 131, L Abduction 109.  2/23: R sh. flexion >100 deg. (pain limited).  3/25:  R sh. flexion 134  deg./ abduction 114 deg.  L sh. flexion 130 deg./ abduction 128 deg. (consistent improvement).    Time  4    Period  Weeks    Status  Achieved    Target Date  02/27/20            Plan - 03/14/20 1423    Clinical Impression Statement  Pt. able to ambulate increase distances without use of SPC but mod. cuing to decrease UE tightness and incourage arm swing.  B shoulder AROM WFL but sore/pain limited with overhead reaching/ flexion.  Good tx. tolerance today and pt. had no LOB during walking tasks in clinic/ outside without assistive device.    Personal Factors and Comorbidities  Comorbidity 2    Comorbidities  Hypertension, Obesity    Examination-Activity Limitations  Bed Mobility;Reach Overhead;Squat;Lift    Examination-Participation Restrictions  Community Activity;Yard Work    [employer] officer  Evolving/Moderate complexity    Clinical Decision Making  Moderate    Rehab Potential  Good    PT Frequency  2x / week    PT Duration  4 weeks    PT Treatment/Interventions  ADLs/Self Care Home Management;Gait training;Stair training;Functional mobility training;Therapeutic activities;Therapeutic exercise;Balance training;Neuromuscular re-education;Manual techniques;Patient/family education    PT Next Visit Plan  Resisted Walking/ dynamic balance tasks.  Reassess R shoulder ROM.    Consulted and Agree with Plan of Care  Patient       Patient will benefit from skilled therapeutic intervention in order to improve the following deficits and impairments:  Abnormal gait, Decreased activity tolerance, Decreased balance, Decreased coordination, Decreased mobility, Decreased endurance, Decreased range of motion, Decreased strength, Difficulty walking, Dizziness, Impaired UE functional use, Pain  Visit Diagnosis: Other abnormalities of gait and mobility  Unspecified lack of coordination  Muscle weakness (generalized)     Problem List Patient Active Problem List   Diagnosis  Date Noted  . Hemiparesis affecting right side as late effect of cerebrovascular accident ([place]) 08/13/2019  . Other sleep apnea 08/06/2019  . History of kidney stones 05/02/2018  . ED (erectile dysfunction) 05/02/2018  . Hyperglycemia 01/30/2017  . Arthritis of  knee, degenerative 01/30/2017  . BPH with obstruction/lower urinary tract symptoms 02/03/2016  . Gross hematuria 02/03/2016  . Dyslipidemia 08/04/2015  . Skin lesions 07/14/2015  . Diverticulitis of large intestine without perforation or abscess without bleeding 06/30/2015  . Edema 06/30/2015  . OSA on CPAP 06/30/2015  . Kidney pain 06/30/2015   Pura Spice, PT, DPT # [phone] 03/14/2020, 2:26 PM  [facility] [address] [place], [place], [address] Phone: [phone]   Fax:  [phone]  Name: Tom Underwood MRN: [id number] Date of Birth: 1951-09-06

## 2020-03-14 NOTE — Therapy (Signed)
[facility] [address] [place], [place], [address] Phone: [phone]   Fax:  [phone]  Physical Therapy Treatment  Patient Details  Name: Tom Underwood MRN: [id number] Date of Birth: Nov 05, 1951 Referring Provider (PT): Jennings Books, MD   Encounter Date: 03/10/2020   Treatment: 22 of 27.  Recert date: 4/65/6812 1439 to 1517   Past Medical History:  Diagnosis Date  . Decreased libido   . Diverticulitis   . HLD (hyperlipidemia)   . Hydronephrosis with renal and ureteral calculus obstruction   . Hydronephrosis with renal and ureteral calculus obstruction   . Hypogonadism in male   . Rhinitis, allergic   . Sleep apnea   . Ureteral stone     Past Surgical History:  Procedure Laterality Date  . EXTERNAL EAR SURGERY    . FINGER SURGERY    . KNEE SURGERY    . left eye      There were no vitals filed for this visit.      Pt. entered PT with c/o fatigue and increase SOB. Pt. reports increase R shoulder pain (6/10) today after taking trash to dump. PT reviewed UE HEP and instructed to stop resisted sh. flexion for a few days.        There.ex.:  Standing hip ex.: flexion/ abduction/ extension/ partial squats/ heel and toe raises 20x each.  No UE assist.  Mirror feedback for posture correction.  Moderate cuing to relax UE.    Nustep L3 B UE/LE 10 min. (consistent cadence)  Walking in clinic with high marching/ heel strike and cuing to relax UE  Ambulate outside on grassy terrain with no assistive device.  No LOB but extra time.      PT Long Term Goals - 02/27/20 0951      PT LONG TERM GOAL #1   Title  Pt. will be able to ambulate with least assistive device for 0.8 mile with good gait mechanics in home community to improve walking endurance.    Baseline  Pt. reports he can walk 0.2 mile in his mobile home community.  3/25: increase walking endurance but not to baseline.    Time  4    Period  Weeks    Status  Partially Met    Target Date  03/26/20      PT LONG TERM GOAL #2   Title  Pt. will improve FOTO score to > 57 to show improvements in LE function for ADLs    Baseline  41.  3/22: 44    Time  4    Period  Weeks    Status  Not Met    Target Date  03/26/20      PT LONG TERM GOAL #3   Title  Pt. will increase R LE strength to grossly 5/5 to improve gait mechanics and LE strength for ADLs    Baseline  R hip flexion 4-/5, knee flexion 4+/5, ankle everison 4+/5.  3/25:  hip flexion 4/5 MMT    Time  4    Period  Weeks    Status  Partially Met    Target Date  03/26/20      PT LONG TERM GOAL #4   Title  Pt. will increase bilat UE flexion/abduction to 120 deg. for functional mobility for overhead reaching and bathing.    Baseline  R Flexion 79 with pain in deltoid, R Abduction 108; L flexion 131, L Abduction 109.  2/23: R sh. flexion >100  deg. (pain limited).  3/25:  R sh. flexion 134 deg./ abduction 114 deg.  L sh. flexion 130 deg./ abduction 128 deg. (consistent improvement).    Time  4    Period  Weeks    Status  Achieved    Target Date  02/27/20         PT tx. limited to by increase sh. pain/ generalized fatigue prior to tx. session. PT recommends pt. stop UE resisted sh. flexion over next several days to decrease inflamation. Pt. did well with standing hip ex. with no UE assist in //-bars (mirror feedback for posture correction). Pt. ambulates without assist device on grassy terrain/ uneven sidewalks/ down curbs with SBA for cuing. Pt. has difficulty relaxing UE while walking.      Patient will benefit from skilled therapeutic intervention in order to improve the following deficits and impairments:  Abnormal gait, Decreased activity tolerance, Decreased balance, Decreased coordination, Decreased mobility, Decreased endurance, Decreased range of motion, Decreased strength, Difficulty walking, Dizziness, Impaired UE functional use, Pain  Visit Diagnosis: Other abnormalities of  gait and mobility  Unspecified lack of coordination  Muscle weakness (generalized)     Problem List Patient Active Problem List   Diagnosis Date Noted  . Hemiparesis affecting right side as late effect of cerebrovascular accident ([place]) 08/13/2019  . Other sleep apnea 08/06/2019  . History of kidney stones 05/02/2018  . ED (erectile dysfunction) 05/02/2018  . Hyperglycemia 01/30/2017  . Arthritis of knee, degenerative 01/30/2017  . BPH with obstruction/lower urinary tract symptoms 02/03/2016  . Gross hematuria 02/03/2016  . Dyslipidemia 08/04/2015  . Skin lesions 07/14/2015  . Diverticulitis of large intestine without perforation or abscess without bleeding 06/30/2015  . Edema 06/30/2015  . OSA on CPAP 06/30/2015  . Kidney pain 06/30/2015   Pura Spice, PT, DPT # [id number] 03/14/2020, 9:34 AM  [facility] [address] [place], [place], [address] Phone: [phone]   Fax:  [phone]  Name: Tom Underwood MRN: [id number] Date of Birth: August 19, 1951

## 2020-03-16 ENCOUNTER — Other Ambulatory Visit: Payer: Self-pay

## 2020-03-16 ENCOUNTER — Ambulatory Visit: Payer: Medicare Other | Admitting: Physical Therapy

## 2020-03-16 DIAGNOSIS — M6281 Muscle weakness (generalized): Secondary | ICD-10-CM

## 2020-03-16 DIAGNOSIS — R279 Unspecified lack of coordination: Secondary | ICD-10-CM

## 2020-03-16 DIAGNOSIS — R2689 Other abnormalities of gait and mobility: Secondary | ICD-10-CM

## 2020-03-17 ENCOUNTER — Encounter: Payer: Self-pay | Admitting: Physical Therapy

## 2020-03-17 NOTE — Therapy (Signed)
[facility] [address] [place], [place], [address] Phone: [phone]   Fax:  [phone]  Physical Therapy Treatment  Patient Details  Name: Tom Underwood MRN: [id number] Date of Birth: 10-18-1951 Referring Provider (PT): Jennings Books, MD   Encounter Date: 03/16/2020  PT End of Session - 03/17/20 1930    Visit Number  24    Number of Visits  27    Date for PT Re-Evaluation  03/26/20    Authorization - Visit Number  6    Authorization - Number of Visits  10    PT Start Time  1031    PT Stop Time  1120    PT Time Calculation (min)  49 min    Equipment Utilized During Treatment  Gait belt    Activity Tolerance  Patient tolerated treatment well;Patient limited by fatigue;Patient limited by pain    Behavior During Therapy  [facility] for tasks assessed/performed       Past Medical History:  Diagnosis Date  . Decreased libido   . Diverticulitis   . HLD (hyperlipidemia)   . Hydronephrosis with renal and ureteral calculus obstruction   . Hydronephrosis with renal and ureteral calculus obstruction   . Hypogonadism in male   . Rhinitis, allergic   . Sleep apnea   . Ureteral stone     Past Surgical History:  Procedure Laterality Date  . EXTERNAL EAR SURGERY    . FINGER SURGERY    . KNEE SURGERY    . left eye      There were no vitals filed for this visit.  Subjective Assessment - 03/17/20 1928    Subjective  Pt. states shoulders are sore/achy with most reaching/ overhead tasks.    Pertinent History  Pt. had a right sided stroke and loves to watch football and travel to the beach with his wife.    Limitations  Walking;Standing    How long can you walk comfortably?  0.2 miles before heaviness/fatigue    Patient Stated Goals  Increase walking distance, carry things with less pain in shoulder    Currently in Pain?  Yes    Pain Score  2     Pain Location  Shoulder    Pain Orientation  Right;Left    Pain Descriptors /  Indicators  Aching    Pain Onset  More than a month ago    Pain Onset  More than a month ago          There.ex.:  Seated B shoulder AROM (flexion/ abduction)- pain tolerable.  Pt. Demonstrates functional ROM but c/o "stiffness/ discomfort/ heaviness" with overhead tasks.  Nustep L5 10 min. B UE/LE today (no increase pain) Standing hip flexion/ abduction/ extension/ heel and toe raises/ knee flexion 20x. Lateral walking in //-bars with no UE assist.  Step ups/ down with eccentric quad control in //-bars  Neuro:  Standing step touches L/R 10x each (no UE assist) Walking in clinic/ hallway 5.5 laps without assistive device working on consistent arm swing Walking outside on grass/ curbs/ ramp with 2-point gait pattern with use of SPC.   Working on progressing off the [facility] with mod. [place] safely.  Doing better on inside/ level surfaces.  Pt. Still benefits from [facility] with outside gait.     PT Long Term Goals - 02/27/20 0951      PT LONG TERM GOAL #1   Title  Pt. will be able to ambulate with least assistive device for  0.8 mile with good gait mechanics in home community to improve walking endurance.    Baseline  Pt. reports he can walk 0.2 mile in his mobile home community.  3/25: increase walking endurance but not to baseline.    Time  4    Period  Weeks    Status  Partially Met    Target Date  03/26/20      PT LONG TERM GOAL #2   Title  Pt. will improve FOTO score to > 57 to show improvements in LE function for ADLs    Baseline  41.  3/22: 44    Time  4    Period  Weeks    Status  Not Met    Target Date  03/26/20      PT LONG TERM GOAL #3   Title  Pt. will increase R LE strength to grossly 5/5 to improve gait mechanics and LE strength for ADLs    Baseline  R hip flexion 4-/5, knee flexion 4+/5, ankle everison 4+/5.  3/25:  hip flexion 4/5 MMT    Time  4    Period  Weeks    Status  Partially Met    Target Date  03/26/20      PT LONG TERM GOAL #4   Title  Pt.  will increase bilat UE flexion/abduction to 120 deg. for functional mobility for overhead reaching and bathing.    Baseline  R Flexion 79 with pain in deltoid, R Abduction 108; L flexion 131, L Abduction 109.  2/23: R sh. flexion >100 deg. (pain limited).  3/25:  R sh. flexion 134 deg./ abduction 114 deg.  L sh. flexion 130 deg./ abduction 128 deg. (consistent improvement).    Time  4    Period  Weeks    Status  Achieved    Target Date  02/27/20            Plan - 03/17/20 1930    Clinical Impression Statement  Moderate verbal cuing for pt. to relax B UE and increase arm swing during gait.  Pt. ambulates around PT clinic with no assistive device and 2-point gait pattern with SPC while outside, esp. on grassy terrain.  Pt. showing consistent progress with B LE muscle strengthening/ standing tolerance during balance/ functional tasks.    Personal Factors and Comorbidities  Comorbidity 2    Comorbidities  Hypertension, Obesity    Examination-Activity Limitations  Bed Mobility;Reach Overhead;Squat;Lift    Examination-Participation Restrictions  Community Activity;Yard Work    [employer]  Evolving/Moderate complexity    Clinical Decision Making  Moderate    Rehab Potential  Good    PT Frequency  2x / week    PT Duration  4 weeks    PT Treatment/Interventions  ADLs/Self Care Home Management;Gait training;Stair training;Functional mobility training;Therapeutic activities;Therapeutic exercise;Balance training;Neuromuscular re-education;Manual techniques;Patient/family education    PT Next Visit Plan  Resisted Walking/ dynamic balance tasks.  Reassess B shoulder ROM.    Consulted and Agree with Plan of Care  Patient       Patient will benefit from skilled therapeutic intervention in order to improve the following deficits and impairments:  Abnormal gait, Decreased activity tolerance, Decreased balance, Decreased coordination, Decreased mobility, Decreased endurance,  Decreased range of motion, Decreased strength, Difficulty walking, Dizziness, Impaired UE functional use, Pain  Visit Diagnosis: Other abnormalities of gait and mobility  Unspecified lack of coordination  Muscle weakness (generalized)     Problem List Patient Active Problem List  Diagnosis Date Noted  . Hemiparesis affecting right side as late effect of cerebrovascular accident ([place]) 08/13/2019  . Other sleep apnea 08/06/2019  . History of kidney stones 05/02/2018  . ED (erectile dysfunction) 05/02/2018  . Hyperglycemia 01/30/2017  . Arthritis of knee, degenerative 01/30/2017  . BPH with obstruction/lower urinary tract symptoms 02/03/2016  . Gross hematuria 02/03/2016  . Dyslipidemia 08/04/2015  . Skin lesions 07/14/2015  . Diverticulitis of large intestine without perforation or abscess without bleeding 06/30/2015  . Edema 06/30/2015  . OSA on CPAP 06/30/2015  . Kidney pain 06/30/2015   Pura Spice, PT, DPT # [phone] 03/17/2020, 7:33 PM  [facility] [address] [place], [place], [address] Phone: [phone]   Fax:  [phone]  Name: Tom Underwood MRN: [id number] Date of Birth: 11/26/1951

## 2020-03-19 ENCOUNTER — Ambulatory Visit: Payer: Medicare Other | Admitting: Physical Therapy

## 2020-03-19 ENCOUNTER — Encounter: Payer: Self-pay | Admitting: Physical Therapy

## 2020-03-19 ENCOUNTER — Other Ambulatory Visit: Payer: Self-pay

## 2020-03-19 DIAGNOSIS — M6281 Muscle weakness (generalized): Secondary | ICD-10-CM | POA: Diagnosis not present

## 2020-03-19 DIAGNOSIS — R2689 Other abnormalities of gait and mobility: Secondary | ICD-10-CM | POA: Diagnosis not present

## 2020-03-19 DIAGNOSIS — R279 Unspecified lack of coordination: Secondary | ICD-10-CM | POA: Diagnosis not present

## 2020-03-19 NOTE — Therapy (Signed)
[facility] [address] [place], [place], [address] Phone: [phone]   Fax:  [phone]  Physical Therapy Treatment  Patient Details  Name: Tom Underwood MRN: [id number] Date of Birth: Aug 30, 1951 Referring Provider (PT): Jennings Books, MD   Encounter Date: 03/19/2020  Treatment:  25 of 27.  Recert date: 1/44/8185 0940 to 1034   Past Medical History:  Diagnosis Date  . Decreased libido   . Diverticulitis   . HLD (hyperlipidemia)   . Hydronephrosis with renal and ureteral calculus obstruction   . Hydronephrosis with renal and ureteral calculus obstruction   . Hypogonadism in male   . Rhinitis, allergic   . Sleep apnea   . Ureteral stone     Past Surgical History:  Procedure Laterality Date  . EXTERNAL EAR SURGERY    . FINGER SURGERY    . KNEE SURGERY    . left eye      There were no vitals filed for this visit.      Pt. reports an "achy" 2/10 R shoulder pain with overhead reaching/ UE assist on Nustep. Pt. entered PT with use of SPC and consistent gait pattern.       L/R shoulder AROM : flexion (126 deg./ 134 deg.), abduction (118 deg./ 117 deg.).  HR: 91 bpm after Nustep, O2 sat: 96%    There.ex.:  Nustep L6 10 min. B UE/LE today (persistant c/o 2/10 R shoulder pain)- increase resistance today.   Standing shoulder AROM (reassessed ROM)- mirror feedback for technique/ posture.    Standing hip flexion/ abduction/ extension/ heel and toe raises/ knee flexion 20x. Min. To no UE assist.  Mirror feedback for posture/ cuing to relax UE and shoulders.    Forward/ backwards/ lateral walking in //-bars with no UE assist.  Step ups/ down with eccentric quad control in //-bars   Neuro:  Airex step ups/ overs in //-bars (min. To no UE assist)- mirror feedback for proper upright posture/ head position.    Green hurdles: recip. Step overs  Walking in clinic/ hallway 6 laps without assistive device  working on consistent arm swing  Walking outside on grass/ curbs/ ramp with 2-point gait pattern with use of SPC.      PT Long Term Goals - 02/27/20 0951      PT LONG TERM GOAL #1   Title  Pt. will be able to ambulate with least assistive device for 0.8 mile with good gait mechanics in home community to improve walking endurance.    Baseline  Pt. reports he can walk 0.2 mile in his mobile home community.  3/25: increase walking endurance but not to baseline.    Time  4    Period  Weeks    Status  Partially Met    Target Date  03/26/20      PT LONG TERM GOAL #2   Title  Pt. will improve FOTO score to > 57 to show improvements in LE function for ADLs    Baseline  41.  3/22: 44    Time  4    Period  Weeks    Status  Not Met    Target Date  03/26/20      PT LONG TERM GOAL #3   Title  Pt. will increase R LE strength to grossly 5/5 to improve gait mechanics and LE strength for ADLs    Baseline  R hip flexion 4-/5, knee flexion 4+/5, ankle everison 4+/5.  3/25:  hip  flexion 4/5 MMT    Time  4    Period  Weeks    Status  Partially Met    Target Date  03/26/20      PT LONG TERM GOAL #4   Title  Pt. will increase bilat UE flexion/abduction to 120 deg. for functional mobility for overhead reaching and bathing.    Baseline  R Flexion 79 with pain in deltoid, R Abduction 108; L flexion 131, L Abduction 109.  2/23: R sh. flexion >100 deg. (pain limited).  3/25:  R sh. flexion 134 deg./ abduction 114 deg.  L sh. flexion 130 deg./ abduction 128 deg. (consistent improvement).    Time  4    Period  Weeks    Status  Achieved    Target Date  02/27/20         B shoulder stiffness noted during gait (arm swing) and during overhead reaching tasks. Pt. ambulates with slow but consistent gait pattern while using SPC inside/ outside. No LOB with all ther.ex./ balance tasks. Pt. works hard with LE resisted ther.ex. in //-bars with fatigue noted and few seated rest breaks. PT encouraged pt. to remain  compliant with HEP and daily walking.      Patient will benefit from skilled therapeutic intervention in order to improve the following deficits and impairments:  Abnormal gait, Decreased activity tolerance, Decreased balance, Decreased coordination, Decreased mobility, Decreased endurance, Decreased range of motion, Decreased strength, Difficulty walking, Dizziness, Impaired UE functional use, Pain  Visit Diagnosis: Other abnormalities of gait and mobility  Unspecified lack of coordination  Muscle weakness (generalized)     Problem List Patient Active Problem List   Diagnosis Date Noted  . Hemiparesis affecting right side as late effect of cerebrovascular accident ([place]) 08/13/2019  . Other sleep apnea 08/06/2019  . History of kidney stones 05/02/2018  . ED (erectile dysfunction) 05/02/2018  . Hyperglycemia 01/30/2017  . Arthritis of knee, degenerative 01/30/2017  . BPH with obstruction/lower urinary tract symptoms 02/03/2016  . Gross hematuria 02/03/2016  . Dyslipidemia 08/04/2015  . Skin lesions 07/14/2015  . Diverticulitis of large intestine without perforation or abscess without bleeding 06/30/2015  . Edema 06/30/2015  . OSA on CPAP 06/30/2015  . Kidney pain 06/30/2015   Pura Spice, PT, DPT # [phone] 03/21/2020, 6:20 PM  [facility] [address] [place], [place], [address] Phone: [phone]   Fax:  [phone]  Name: Tom Underwood MRN: [id number] Date of Birth: 02-Feb-1951

## 2020-03-23 ENCOUNTER — Other Ambulatory Visit: Payer: Self-pay

## 2020-03-23 ENCOUNTER — Ambulatory Visit: Payer: Medicare Other | Admitting: Physical Therapy

## 2020-03-23 DIAGNOSIS — R2689 Other abnormalities of gait and mobility: Secondary | ICD-10-CM | POA: Diagnosis not present

## 2020-03-23 DIAGNOSIS — M6281 Muscle weakness (generalized): Secondary | ICD-10-CM | POA: Diagnosis not present

## 2020-03-23 DIAGNOSIS — R279 Unspecified lack of coordination: Secondary | ICD-10-CM

## 2020-03-24 ENCOUNTER — Telehealth: Payer: Self-pay

## 2020-03-24 NOTE — Telephone Encounter (Signed)
Copied from CRM [id number]. Topic: General - Other >> Mar 24, 2020  2:05 PM Jaquita Rector A wrote: Reason for CRM: Patient called to inform Dr Carlynn Purl that he is having some issues with his arm since his stroke and also has issues with knees at times. States that he was informed by the Neurologist that he may need to see an Orthopedic specialist. Patient Ph# [phone]   His arms are getting really sore when he does physical therapy. PT suggested that he see a doctor at [facility] or Dr. Salomon Mast. He was evaluated about 1 year ago by ortho at [facility] for his knees.

## 2020-03-25 ENCOUNTER — Encounter: Payer: Self-pay | Admitting: Physical Therapy

## 2020-03-25 NOTE — Telephone Encounter (Signed)
lvm for pt to return call to schedule appt.

## 2020-03-25 NOTE — Telephone Encounter (Signed)
Please call and schedule appointment for referral.

## 2020-03-25 NOTE — Therapy (Signed)
[facility] [address] [place], [place], [address] Phone: [phone]   Fax:  [phone]  Physical Therapy Treatment  Patient Details  Name: Tom Underwood MRN: [id number] Date of Birth: [birth year] Referring Provider (PT): Jennings Books, MD   Encounter Date: 03/23/2020  PT End of Session - 03/25/20 2022    Visit Number  26    Number of Visits  27    Date for PT Re-Evaluation  03/26/20    Authorization - Visit Number  8    Authorization - Number of Visits  10    PT Start Time  1026    PT Stop Time  1116    PT Time Calculation (min)  50 min    Equipment Utilized During Treatment  Gait belt    Activity Tolerance  Patient tolerated treatment well;Patient limited by fatigue;Patient limited by pain    Behavior During Therapy  [facility] for tasks assessed/performed       Past Medical History:  Diagnosis Date  . Decreased libido   . Diverticulitis   . HLD (hyperlipidemia)   . Hydronephrosis with renal and ureteral calculus obstruction   . Hydronephrosis with renal and ureteral calculus obstruction   . Hypogonadism in male   . Rhinitis, allergic   . Sleep apnea   . Ureteral stone     Past Surgical History:  Procedure Laterality Date  . EXTERNAL EAR SURGERY    . FINGER SURGERY    . KNEE SURGERY    . left eye      There were no vitals filed for this visit.  Subjective Assessment - 03/25/20 2019    Subjective  Pt. reports continued discomfort/ stiffness/ heaviness in B shoulders.  Pt. states Neurologist wants pt. to see an Orthopedist MD to evaluate shoulders.    Pertinent History  Pt. had a right sided stroke and loves to watch football and travel to the beach with his wife.    Limitations  Walking;Standing    How long can you walk comfortably?  0.2 miles before heaviness/fatigue    Patient Stated Goals  Increase walking distance, carry things with less pain in shoulder    Currently in Pain?  Yes    Pain Score  3     Pain  Location  Shoulder    Pain Orientation  Right;Left    Pain Onset  More than a month ago    Pain Onset  More than a month ago        There.ex.:  Nustep L6 10 min. B UE/LE today (discussed MD f/u/ shoulder issues)  Standing shoulder AROM (all planes)- mirror feedback for technique/ posture.  Increase discomfort with repeated movement overhead.     Standing hip flexion/ abduction/ extension/ heeland toeraises/ knee flexion 20x. Min. To no UE assist.  Mirror feedback for posture/ cuing to relax UE and shoulders.    2nd shelf reaching with cones: assist with sh. Movement/ assessing pain   Neuro:  Resisted Nautilus walkouts (70#): Forward/ backwards/ lateral walking with no UE assist 3x each.  Obstacle course: 3" and 6" step ups/overs, green hurdles recip. Pattern, cone taps/ maneuvering.   Walking in clinic/ hallway4laps without assistive device working on consistent arm swing.  Added alt. UE and LE touches/ head turns/ posture correction.   Walking outside on grass/ curbs/ rampwith 2-point gait pattern with use of SPC.     PT Long Term Goals - 02/27/20 8828      PT  LONG TERM GOAL #1   Title  Pt. will be able to ambulate with least assistive device for 0.8 mile with good gait mechanics in home community to improve walking endurance.    Baseline  Pt. reports he can walk 0.2 mile in his mobile home community.  3/25: increase walking endurance but not to baseline.    Time  4    Period  Weeks    Status  Partially Met    Target Date  03/26/20      PT LONG TERM GOAL #2   Title  Pt. will improve FOTO score to > 57 to show improvements in LE function for ADLs    Baseline  41.  3/22: 44    Time  4    Period  Weeks    Status  Not Met    Target Date  03/26/20      PT LONG TERM GOAL #3   Title  Pt. will increase R LE strength to grossly 5/5 to improve gait mechanics and LE strength for ADLs    Baseline  R hip flexion 4-/5, knee flexion 4+/5, ankle everison 4+/5.   3/25:  hip flexion 4/5 MMT    Time  4    Period  Weeks    Status  Partially Met    Target Date  03/26/20      PT LONG TERM GOAL #4   Title  Pt. will increase bilat UE flexion/abduction to 120 deg. for functional mobility for overhead reaching and bathing.    Baseline  R Flexion 79 with pain in deltoid, R Abduction 108; L flexion 131, L Abduction 109.  2/23: R sh. flexion >100 deg. (pain limited).  3/25:  R sh. flexion 134 deg./ abduction 114 deg.  L sh. flexion 130 deg./ abduction 128 deg. (consistent improvement).    Time  4    Period  Weeks    Status  Achieved    Target Date  02/27/20            Plan - 03/25/20 2023    Clinical Impression Statement  Pt. focused/ limited with B shoulder flexion and overhead reaching due to shoulder discomfort.  Pt. hoping to f/u with Ortho MD prior to PCP next week.  Occasional LOB during SLS/ alt. UE and LE touches in //-bars.  Moderate fatigue in B LE during resisted Nautilus.  Pt. ambulates with more consistent gait pattern without assistive device in gym and outside.  No recent LOB but increase B shoulder discomfort with increase UE use/ walking.    Personal Factors and Comorbidities  Comorbidity 2    Comorbidities  Hypertension, Obesity    Examination-Activity Limitations  Bed Mobility;Reach Overhead;Squat;Lift    Examination-Participation Restrictions  Community Activity;Yard Work    [employer]chant [employer] officer  Evolving/Moderate complexity    Clinical Decision Making  Moderate    Rehab Potential  Good    PT Frequency  2x / week    PT Duration  4 weeks    PT Treatment/Interventions  ADLs/Self Care Home Management;Gait training;Stair training;Functional mobility training;Therapeutic activities;Therapeutic exercise;Balance training;Neuromuscular re-education;Manual techniques;Patient/family education    PT Next Visit Plan  Resisted Walking/ dynamic balance tasks.    Consulted and Agree with Plan of Care  Patient       Patient will  benefit from skilled therapeutic intervention in order to improve the following deficits and impairments:  Abnormal gait, Decreased activity tolerance, Decreased balance, Decreased coordination, Decreased mobility, Decreased endurance, Decreased range of motion, Decreased  strength, Difficulty walking, Dizziness, Impaired UE functional use, Pain  Visit Diagnosis: Other abnormalities of gait and mobility  Unspecified lack of coordination  Muscle weakness (generalized)     Problem List Patient Active Problem List   Diagnosis Date Noted  . Hemiparesis affecting right side as late effect of cerebrovascular accident ([place]) 08/13/2019  . Other sleep apnea 08/06/2019  . History of kidney stones 05/02/2018  . ED (erectile dysfunction) 05/02/2018  . Hyperglycemia 01/30/2017  . Arthritis of knee, degenerative 01/30/2017  . BPH with obstruction/lower urinary tract symptoms 02/03/2016  . Gross hematuria 02/03/2016  . Dyslipidemia 08/04/2015  . Skin lesions 07/14/2015  . Diverticulitis of large intestine without perforation or abscess without bleeding 06/30/2015  . Edema 06/30/2015  . OSA on CPAP 06/30/2015  . Kidney pain 06/30/2015   Pura Spice, PT, DPT # [phone] 03/25/2020, 8:27 PM  [facility] [address] [place], [place], [address] Phone: [phone]   Fax:  [phone]  Name: JOEL COWIN MRN: [id number] Date of Birth: 16-Nov-1951

## 2020-03-26 ENCOUNTER — Ambulatory Visit (INDEPENDENT_AMBULATORY_CARE_PROVIDER_SITE_OTHER): Payer: Medicare Other | Admitting: Family Medicine

## 2020-03-26 ENCOUNTER — Encounter: Payer: Self-pay | Admitting: Family Medicine

## 2020-03-26 ENCOUNTER — Other Ambulatory Visit: Payer: Self-pay

## 2020-03-26 ENCOUNTER — Encounter: Payer: Self-pay | Admitting: Physical Therapy

## 2020-03-26 ENCOUNTER — Ambulatory Visit: Payer: Medicare Other | Admitting: Physical Therapy

## 2020-03-26 VITALS — BP 146/78 | HR 95 | Temp 98.5°F | Resp 21 | Wt 296.9 lb

## 2020-03-26 DIAGNOSIS — M170 Bilateral primary osteoarthritis of knee: Secondary | ICD-10-CM | POA: Diagnosis not present

## 2020-03-26 DIAGNOSIS — R279 Unspecified lack of coordination: Secondary | ICD-10-CM

## 2020-03-26 DIAGNOSIS — M25511 Pain in right shoulder: Secondary | ICD-10-CM

## 2020-03-26 DIAGNOSIS — M25512 Pain in left shoulder: Secondary | ICD-10-CM | POA: Diagnosis not present

## 2020-03-26 DIAGNOSIS — I69351 Hemiplegia and hemiparesis following cerebral infarction affecting right dominant side: Secondary | ICD-10-CM | POA: Diagnosis not present

## 2020-03-26 DIAGNOSIS — M6281 Muscle weakness (generalized): Secondary | ICD-10-CM | POA: Diagnosis not present

## 2020-03-26 DIAGNOSIS — R2689 Other abnormalities of gait and mobility: Secondary | ICD-10-CM | POA: Diagnosis not present

## 2020-03-26 NOTE — Therapy (Signed)
[facility] [address] [place], [place], [address] Phone: [phone]   Fax:  [phone]  Physical Therapy Treatment  Patient Details  Name: Tom Underwood MRN: [id number] Date of Birth: 11-Nov-1951 Referring Provider (PT): Jennings Books, MD   Encounter Date: 03/26/2020  PT End of Session - 03/26/20 0944    Visit Number  27    Number of Visits  27    Date for PT Re-Evaluation  03/26/20    Authorization - Visit Number  9    Authorization - Number of Visits  10    PT Start Time  0940    PT Stop Time  1040    PT Time Calculation (min)  60 min    Equipment Utilized During Treatment  Gait belt    Activity Tolerance  Patient tolerated treatment well;Patient limited by fatigue;Patient limited by pain    Behavior During Therapy  [facility] for tasks assessed/performed       Past Medical History:  Diagnosis Date  . Decreased libido   . Diverticulitis   . HLD (hyperlipidemia)   . Hydronephrosis with renal and ureteral calculus obstruction   . Hydronephrosis with renal and ureteral calculus obstruction   . Hypogonadism in male   . Rhinitis, allergic   . Sleep apnea   . Ureteral stone     Past Surgical History:  Procedure Laterality Date  . EXTERNAL EAR SURGERY    . FINGER SURGERY    . KNEE SURGERY    . left eye      There were no vitals filed for this visit.  Subjective Assessment - 03/26/20 0940    Subjective  Pt. reports continued discomfort/ stiffness/ heaviness in B shoulders.  Pt. has f/u with PCP this afternoon to assess B shoulder.  Pt. reports 4/10 B shoulder pain prior to PT tx. session.  Pt. in shoulders worsens with overhead reaching.    Pertinent History  Pt. had a right sided stroke and loves to watch football and travel to the beach with his wife.    Limitations  Walking;Standing    How long can you walk comfortably?  0.2 miles before heaviness/fatigue    Patient Stated Goals  Increase walking distance, carry  things with less pain in shoulder    Currently in Pain?  Yes    Pain Score  4     Pain Location  Shoulder    Pain Orientation  Right;Left    Pain Descriptors / Indicators  Aching    Pain Onset  More than a month ago    Pain Onset  More than a month ago        There.ex.:  Nustep L610 min. B UE/LE (consistent cadence)- increase SOB but good O2 sat. 97%/ HR 91 bpm.  Seated shoulder pulley: flexion/ abduction (pain limited to 100 deg.)- slight increase in L shoulder pain as compared to R sh.  Pt. Reports pain in deltoid muscles, not joint pain.  Significant muscle tightness with palpation of B deltoids.    Standing (4#): hip flexion/ abduction/ extension/ heeland toeraises/ knee flexion 20x.Min. To no UE assist. Mirror feedback for posture/ cuing to relax UE and shoulders. Walking in //-bars (cuing to increase hip flexion/ step length).  Seated LAQ/marching 20x each.     Neuro:  Resisted Nautilus walkouts (70#): Forward/ backwards/ lateral walking with no UE assist 3x each.  Walking in //-bars with green hurdle step overs 6x. (wearing 4# ankle wts.)- recip. Pattern.  Walking in clinic/ hallway4laps without assistive device working on consistent arm swing.  Added alt. UE and LE touches/ head turns/ posture correction.   Walking outside on curbs/ rampwith no assistive device working on consistent hip flexion/ foot clearance.     PT Long Term Goals - 02/27/20 0951      PT LONG TERM GOAL #1   Title  Pt. will be able to ambulate with least assistive device for 0.8 mile with good gait mechanics in home community to improve walking endurance.    Baseline  Pt. reports he can walk 0.2 mile in his mobile home community.  3/25: increase walking endurance but not to baseline.    Time  4    Period  Weeks    Status  Partially Met    Target Date  03/26/20      PT LONG TERM GOAL #2   Title  Pt. will improve FOTO score to > 57 to show improvements in LE function for ADLs     Baseline  41.  3/22: 44    Time  4    Period  Weeks    Status  Not Met    Target Date  03/26/20      PT LONG TERM GOAL #3   Title  Pt. will increase R LE strength to grossly 5/5 to improve gait mechanics and LE strength for ADLs    Baseline  R hip flexion 4-/5, knee flexion 4+/5, ankle everison 4+/5.  3/25:  hip flexion 4/5 MMT    Time  4    Period  Weeks    Status  Partially Met    Target Date  03/26/20      PT LONG TERM GOAL #4   Title  Pt. will increase bilat UE flexion/abduction to 120 deg. for functional mobility for overhead reaching and bathing.    Baseline  R Flexion 79 with pain in deltoid, R Abduction 108; L flexion 131, L Abduction 109.  2/23: R sh. flexion >100 deg. (pain limited).  3/25:  R sh. flexion 134 deg./ abduction 114 deg.  L sh. flexion 130 deg./ abduction 128 deg. (consistent improvement).    Time  4    Period  Weeks    Status  Achieved    Target Date  02/27/20            Plan - 03/26/20 0944    Clinical Impression Statement  Pt. has shown minimal improvement B shoulder ROM/ pain over past couple of weeks.  Pt. requires cuing to relax B UE while walking in clinic.  B knee stiffness remains with initial standing/walking from chair.  Pt. instructed to f/u with MD and contact PT to discuss POC.  Pt. will not be discharged at this time until MD f/u.  Pt. will continue with current walking/ HEP.    Personal Factors and Comorbidities  Comorbidity 2    Comorbidities  Hypertension, Obesity    Examination-Activity Limitations  Bed Mobility;Reach Overhead;Squat;Lift    Examination-Participation Restrictions  Community Activity;Yard Work    [employer] officer  Evolving/Moderate complexity    Clinical Decision Making  Moderate    Rehab Potential  Good    PT Frequency  2x / week    PT Duration  4 weeks    PT Treatment/Interventions  ADLs/Self Care Home Management;Gait training;Stair training;Functional mobility training;Therapeutic  activities;Therapeutic exercise;Balance training;Neuromuscular re-education;Manual techniques;Patient/family education    PT Next Visit Plan  Pt. will call to discuss MD appt./ ortho appt.  Consulted and Agree with Plan of Care  Patient       Patient will benefit from skilled therapeutic intervention in order to improve the following deficits and impairments:  Abnormal gait, Decreased activity tolerance, Decreased balance, Decreased coordination, Decreased mobility, Decreased endurance, Decreased range of motion, Decreased strength, Difficulty walking, Dizziness, Impaired UE functional use, Pain  Visit Diagnosis: Other abnormalities of gait and mobility  Unspecified lack of coordination  Muscle weakness (generalized)     Problem List Patient Active Problem List   Diagnosis Date Noted  . Hemiparesis affecting right side as late effect of cerebrovascular accident ([place]) 08/13/2019  . Other sleep apnea 08/06/2019  . History of kidney stones 05/02/2018  . ED (erectile dysfunction) 05/02/2018  . Hyperglycemia 01/30/2017  . Arthritis of knee, degenerative 01/30/2017  . BPH with obstruction/lower urinary tract symptoms 02/03/2016  . Gross hematuria 02/03/2016  . Dyslipidemia 08/04/2015  . Skin lesions 07/14/2015  . Diverticulitis of large intestine without perforation or abscess without bleeding 06/30/2015  . Edema 06/30/2015  . OSA on CPAP 06/30/2015  . Kidney pain 06/30/2015   Pura Spice, PT, DPT # [phone] 03/26/2020, 10:43 AM  [facility] [address] [place], [place], [address] Phone: [phone]   Fax:  [phone]  Name: ANAS REISTER MRN: [id number] Date of Birth: 05/15/1951

## 2020-03-26 NOTE — Progress Notes (Signed)
Patient ID: Tom Underwood, male    DOB: 09-26-1951, 69 y.o.   MRN: [id number]  PCP: Alba Cory, MD  Chief Complaint  Patient presents with  . Referral    Subjective:   Tom Underwood is a 69 y.o. male, presents to clinic with CC of the following:  HPI  Patient reports requesting a referral to Ortho for complaints of bilateral shoulder pain and bilateral knee pain.  He is status post stroke last year and has been working with physical therapy in [place] [facility] with [facility].  He has had some gradual improvement of his gait but using the cane is exacerbated right shoulder pain, he has prior known osteoarthritis in both of his knees that he describes as worsening with a sensation of heaviness and stiffness.  He has not had any knee effusion or erythema. In reviewing physical therapy documentation as noted below, neurologist had requested that the patient see an orthopedist for his right shoulder.  The patient wants to be seen for bilateral shoulder pain and bilateral knee pain.   Subjective Assessment - 03/25/20 2019    Subjective  Pt. reports continued discomfort/ stiffness/ heaviness in B shoulders.  Pt. states Neurologist wants pt. to see an Orthopedist MD to evaluate shoulders.    Patient will benefit from skilled therapeutic intervention in order to improve the following deficits and impairments:  Abnormal gait, Decreased activity tolerance, Decreased balance, Decreased coordination, Decreased mobility, Decreased endurance, Decreased range of motion, Decreased strength, Difficulty walking, Dizziness, Impaired UE functional use, Pain    Patient unfortunately had his stroke 07/23/2019, has right hemiparesis  Patient Active Problem List   Diagnosis Date Noted  . Hemiparesis affecting right side as late effect of cerebrovascular accident (HCC) 08/13/2019  . Other sleep apnea 08/06/2019  . History of kidney stones 05/02/2018  . ED (erectile dysfunction) 05/02/2018  .  Hyperglycemia 01/30/2017  . Arthritis of knee, degenerative 01/30/2017  . BPH with obstruction/lower urinary tract symptoms 02/03/2016  . Gross hematuria 02/03/2016  . Dyslipidemia 08/04/2015  . Skin lesions 07/14/2015  . Diverticulitis of large intestine without perforation or abscess without bleeding 06/30/2015  . Edema 06/30/2015  . OSA on CPAP 06/30/2015  . Kidney pain 06/30/2015      Current Outpatient Medications:  .  acetaminophen (TYLENOL) 650 MG CR tablet, Take 650 mg by mouth every 8 (eight) hours as needed for pain., Disp: , Rfl:  .  amitriptyline (ELAVIL) 25 MG tablet, Take 1 tablet (25 mg total) by mouth at bedtime as needed. for sleep, Disp: 90 tablet, Rfl: 1 .  aspirin EC 81 MG tablet, Take 1 tablet (81 mg total) by mouth daily., Disp: 30 tablet, Rfl: 0 .  Cetirizine HCl 10 MG CAPS, , Disp: , Rfl:  .  glucosamine-chondroitin 500-400 MG tablet, Take 1 tablet by mouth 3 (three) times daily., Disp: , Rfl:  .  meloxicam (MOBIC) 15 MG tablet, as needed, Disp: , Rfl:  .  Multiple Vitamin (MULTIVITAMIN) tablet, Take 1 tablet by mouth daily., Disp: , Rfl:  .  omega-3 acid ethyl esters (LOVAZA) 1 g capsule, Take 2 capsules (2 g total) by mouth 2 (two) times daily., Disp: 120 capsule, Rfl: 5 .  pantoprazole (PROTONIX) 20 MG tablet, Take by mouth., Disp: , Rfl:  .  rosuvastatin (CRESTOR) 20 MG tablet, Take 20 mg by mouth daily., Disp: , Rfl:  .  Saccharomyces boulardii (PROBIOTIC) 250 MG CAPS, Take 1 capsule by mouth daily., Disp: 30 capsule, Rfl:  0 .  sildenafil (REVATIO) 20 MG tablet, Take 3 to 5 tablets two hours before intercouse on an empty stomach.  Do not take with nitrates., Disp: 50 tablet, Rfl: 3 .  valsartan (DIOVAN) 80 MG tablet, Take 1 tablet (80 mg total) by mouth daily., Disp: 90 tablet, Rfl: 1   Allergies  Allergen Reactions  . Penicillins Itching     Family History  Problem Relation Age of Onset  . Asthma Mother   . Heart disease Mother   . Heart attack  Mother   . Dementia Mother   . Asthma Father   . Heart disease Father   . Stroke Father      Social History   Socioeconomic History  . Marital status: Married    Spouse name: [name]   . Number of children: 2  . Years of education: Not on file  . Highest education level: Not on file  Occupational History  . Occupation: retired   Tobacco Use  . Smoking status: Former Smoker    Types: Pipe    Start date: 02/20/1973    Quit date: 02/20/1974    Years since quitting: 46.1  . Smokeless tobacco: Never Used  Substance and Sexual Activity  . Alcohol use: No    Alcohol/week: 0.0 standard drinks  . Drug use: No  . Sexual activity: Yes    Partners: Female  Other Topics Concern  . Not on file  Social History Narrative  . Not on file   Social Determinants of Health   Financial Resource Strain:   . Difficulty of Paying Living Expenses:   Food Insecurity:   . Worried About Programme researcher, broadcasting/film/video in the Last Year:   . Barista in the Last Year:   Transportation Needs:   . Freight forwarder (Medical):   Marland Kitchen Lack of Transportation (Non-Medical):   Physical Activity:   . Days of Exercise per Week:   . Minutes of Exercise per Session:   Stress:   . Feeling of Stress :   Social Connections:   . Frequency of Communication with Friends and Family:   . Frequency of Social Gatherings with Friends and Family:   . Attends Religious Services:   . Active Member of Clubs or Organizations:   . Attends Banker Meetings:   Marland Kitchen Marital Status:   Intimate Partner Violence:   . Fear of Current or Ex-Partner:   . Emotionally Abused:   Marland Kitchen Physically Abused:   . Sexually Abused:     Chart Review Today: I personally reviewed active problem list, medication list, allergies, family history, social history, health maintenance, notes from last encounter, lab results, imaging with the patient/caregiver today.   Review of Systems 10 Systems reviewed and are negative for acute  change except as noted in the HPI.     Objective:   Vitals:   03/26/20 1351  BP: (!) 146/78  Pulse: 95  Resp: (!) 21  Temp: 98.5 F (36.9 C)  SpO2: 96%  Weight: 296 lb 14.4 oz (134.7 kg)    Body mass index is 41.41 kg/m.  Physical Exam Vitals and nursing note reviewed.  Constitutional:      Appearance: Normal appearance. He is obese.  HENT:     Head: Normocephalic and atraumatic.     Right Ear: External ear normal.     Left Ear: External ear normal.  Cardiovascular:     Pulses: Normal pulses.     Heart sounds: Normal heart  sounds.  Musculoskeletal:     Right shoulder: No swelling, deformity, tenderness or bony tenderness. Decreased range of motion. Normal strength. Normal pulse.     Left shoulder: Normal range of motion. Normal strength. Normal pulse.     Comments: Exam limited by body habitus Bilateral knees without effusion or erythema limited range of motion testing with patient limited to sitting in a chair in the exam room, no bony tenderness to bilateral knees    Neurological:     Mental Status: He is alert.     Gait: Gait abnormal.     Comments: Patient uses cane to ambulate      Results for orders placed or performed in visit on 07/02/19  Lipid panel  Result Value Ref Range   Cholesterol, Total 125 100 - 199 mg/dL   Triglycerides 67 0 - 149 mg/dL   HDL 51 >31 mg/dL   VLDL Cholesterol Cal 14 5 - 40 mg/dL   LDL Chol Calc (NIH) 60 0 - 99 mg/dL   Chol/HDL Ratio 2.5 0.0 - 5.0 ratio  Comprehensive metabolic panel  Result Value Ref Range   Glucose 87 65 - 99 mg/dL   BUN 15 8 - 27 mg/dL   Creatinine, Ser 5.94 0.76 - 1.27 mg/dL   GFR calc non Af Amer 85 >59 mL/min/1.73   GFR calc Af Amer 99 >59 mL/min/1.73   BUN/Creatinine Ratio 16 10 - 24   Sodium 140 134 - 144 mmol/L   Potassium 4.2 3.5 - 5.2 mmol/L   Chloride 101 96 - 106 mmol/L   CO2 24 20 - 29 mmol/L   Calcium 10.0 8.6 - 10.2 mg/dL   Total Protein 7.7 6.0 - 8.5 g/dL   Albumin 4.8 3.8 - 4.8 g/dL    Globulin, Total 2.9 1.5 - 4.5 g/dL   Albumin/Globulin Ratio 1.7 1.2 - 2.2   Bilirubin Total 0.9 0.0 - 1.2 mg/dL   Alkaline Phosphatase 129 (H) 39 - 117 IU/L   AST 22 0 - 40 IU/L   ALT 27 0 - 44 IU/L  CBC with Differential/Platelet  Result Value Ref Range   WBC 7.1 3.4 - 10.8 x10E3/uL   RBC 5.43 4.14 - 5.80 x10E6/uL   Hemoglobin 16.5 13.0 - 17.7 g/dL   Hematocrit 58.5 92.9 - 51.0 %   MCV 89 79 - 97 fL   MCH 30.4 26.6 - 33.0 pg   MCHC 34.3 31.5 - 35.7 g/dL   RDW 24.4 62.8 - 63.8 %   Platelets 256 150 - 450 x10E3/uL   Neutrophils 56 Not Estab. %   Lymphs 33 Not Estab. %   Monocytes 9 Not Estab. %   Eos 2 Not Estab. %   Basos 0 Not Estab. %   Neutrophils Absolute 4.0 1.4 - 7.0 x10E3/uL   Lymphocytes Absolute 2.3 0.7 - 3.1 x10E3/uL   Monocytes Absolute 0.6 0.1 - 0.9 x10E3/uL   EOS (ABSOLUTE) 0.2 0.0 - 0.4 x10E3/uL   Basophils Absolute 0.0 0.0 - 0.2 x10E3/uL   Immature Granulocytes 0 Not Estab. %   Immature Grans (Abs) 0.0 0.0 - 0.1 x10E3/uL  Hemoglobin A1c  Result Value Ref Range   Hgb A1c MFr Bld 5.5 4.8 - 5.6 %   Est. average glucose Bld gHb Est-mCnc 111 mg/dL        Assessment & Plan:      ICD-10-CM   1. Hemiparesis of right dominant side as late effect of cerebral infarction [facility])  I69.351 Ambulatory referral to Orthopedic Surgery  2. Acute pain of right shoulder  M25.511 meloxicam (MOBIC) 15 MG tablet    Ambulatory referral to Orthopedic Surgery  3. Osteoarthritis of both knees, unspecified osteoarthritis type  M17.0 meloxicam (MOBIC) 15 MG tablet    Ambulatory referral to Orthopedic Surgery  4. Bilateral shoulder pain, unspecified chronicity  M25.511 meloxicam (MOBIC) 15 MG tablet   M25.512 Ambulatory referral to Orthopedic Surgery    Patient presents for referral to Ortho, he has specifically requested Dr. Mack Guise but then has also seen Ortho at [facility] clinic for his known bilateral osteoarthritis of his knees.  He does not care where he goes but he would like a  specialist to evaluate all of his joints which hurt including bilateral shoulders and bilateral needs.  Patient was specifically sent here by physical therapy per neurology who wanted right shoulder pain assessed.  He does have right hemiparesis, and has been doing physical therapy to regain mobility and function he walks with a cane using his right arm and and working with PT he has had worsening right shoulder pain he reports the pain is everywhere to his right shoulder and upper arm.  Referrals entered.  Can try Mobic for knee pain and swelling    Delsa Grana, PA-C 03/26/20 1:56 PM

## 2020-03-26 NOTE — Patient Instructions (Signed)
For swelling in your legs you can wear compression socks/stockings, elevate legs and be careful to avoid salt in your diet.   Please follow up with your PCP if it is worsening at all  Follow up with ortho for right shoulder and bilateral knee pain and known arthritis

## 2020-03-27 NOTE — Telephone Encounter (Signed)
Pt does not understand why he has to come back into the office when he was just seen yesterday. He is just wanting Leisa to add both shoulders. He is afraid that if both is not notated that the referral place will not check it.

## 2020-03-27 NOTE — Telephone Encounter (Signed)
Copied from CRM [id number]. Topic: General - Other >> Mar 27, 2020 11:54 AM Marylen Ponto wrote: Reason for CRM: Pt stated he needs his office visit notes updated to reflect that he mentioned problems with his left arm as well as he is afraid if he goes to see an ortho specialist they will not address the issue with left arm because it was not documented.

## 2020-03-27 NOTE — Telephone Encounter (Signed)
Pt notified, did appologize

## 2020-03-30 ENCOUNTER — Encounter: Payer: Medicare Other | Admitting: Physical Therapy

## 2020-03-30 ENCOUNTER — Ambulatory Visit: Payer: Medicare Other | Admitting: Family Medicine

## 2020-03-31 ENCOUNTER — Encounter: Payer: Self-pay | Admitting: Family Medicine

## 2020-04-01 ENCOUNTER — Ambulatory Visit: Payer: Medicare Other | Admitting: Family Medicine

## 2020-04-01 DIAGNOSIS — M7531 Calcific tendinitis of right shoulder: Secondary | ICD-10-CM | POA: Diagnosis not present

## 2020-04-01 DIAGNOSIS — M7532 Calcific tendinitis of left shoulder: Secondary | ICD-10-CM | POA: Diagnosis not present

## 2020-04-03 ENCOUNTER — Encounter: Payer: Medicare Other | Admitting: Physical Therapy

## 2020-04-06 ENCOUNTER — Telehealth: Payer: Self-pay | Admitting: Family Medicine

## 2020-04-06 NOTE — Telephone Encounter (Signed)
Spoke with patient he was on vacation, would like a call next week to schedule AWV

## 2020-04-13 ENCOUNTER — Encounter: Payer: Self-pay | Admitting: Physical Therapy

## 2020-04-13 ENCOUNTER — Ambulatory Visit: Payer: Medicare Other | Attending: Neurology | Admitting: Physical Therapy

## 2020-04-13 ENCOUNTER — Other Ambulatory Visit: Payer: Self-pay

## 2020-04-13 DIAGNOSIS — M6281 Muscle weakness (generalized): Secondary | ICD-10-CM | POA: Insufficient documentation

## 2020-04-13 DIAGNOSIS — R2689 Other abnormalities of gait and mobility: Secondary | ICD-10-CM

## 2020-04-13 DIAGNOSIS — R279 Unspecified lack of coordination: Secondary | ICD-10-CM | POA: Diagnosis not present

## 2020-04-13 NOTE — Therapy (Signed)
[facility] [address] [place], [place], [address] Phone: [phone]   Fax:  [phone]  Physical Therapy Treatment  Patient Details  Name: Tom Underwood MRN: [id number] Date of Birth: Jul 23, 1951 Referring Provider (PT): Jennings Books, MD   Encounter Date: 04/13/2020  PT End of Session - 04/13/20 1954    Visit Number  28    Number of Visits  36    Date for PT Re-Evaluation  05/11/20    Authorization - Visit Number  1    Authorization - Number of Visits  10    PT Start Time  9381    PT Stop Time  1431    PT Time Calculation (min)  56 min    Equipment Utilized During Treatment  Gait belt    Activity Tolerance  Patient tolerated treatment well;Patient limited by fatigue;Patient limited by pain    Behavior During Therapy  [facility] for tasks assessed/performed       Past Medical History:  Diagnosis Date  . Decreased libido   . Diverticulitis   . HLD (hyperlipidemia)   . Hydronephrosis with renal and ureteral calculus obstruction   . Hydronephrosis with renal and ureteral calculus obstruction   . Hypogonadism in male   . Rhinitis, allergic   . Sleep apnea   . Ureteral stone     Past Surgical History:  Procedure Laterality Date  . EXTERNAL EAR SURGERY    . FINGER SURGERY    . KNEE SURGERY    . left eye      There were no vitals filed for this visit.  Subjective Assessment - 04/13/20 1950    Subjective  Pt. had MD appointment with Dr. Mack Guise to assess B shoulder pain.  Pt. received injection to B shoulders with slight improvement noted.  Pt. reports 3/10 B shoulder pain with overhead reaching.  See B sh. X-rays.    Pertinent History  Pt. had a right sided stroke and loves to watch football and travel to the beach with his wife.    Limitations  Walking;Standing    How long can you walk comfortably?  0.2 miles before heaviness/fatigue    Patient Stated Goals  Increase walking distance, carry things with less pain in  shoulder    Currently in Pain?  Yes    Pain Score  3     Pain Location  Shoulder    Pain Orientation  Left;Right    Pain Onset  More than a month ago    Pain Onset  More than a month ago         [facility] PT Assessment - 04/13/20 0001      Assessment   Medical Diagnosis  Impaired Gait    Referring Provider (PT)  Jennings Books, MD    Onset Date/Surgical Date  07/23/19    Hand Dominance  [place] residence      Prior Function   Level of Independence  Independent       There.ex.:  Nustep L5 10 min. (0.5 miles in 9 mins.)- quick cadence with B UE/LE Reassessment of B shoulder AROM/ B LE muscle strength Standing Nautilus: 30# lat. Pull downs/ 30# tricep extension 20x.  Standing 5# bicep curls. Standing hip ex./ Reviewed HEP.  Neuro.:  Walking cone taps at agility ladder with no assistive device (mod. A) and use of SPC with CGA for safety/ cuing. Walking outside on  varying terrain working on consistent gait pattern/ BOS.     PT Long Term Goals - 04/13/20 2001      PT LONG TERM GOAL #1   Title  Pt. will be able to ambulate with least assistive device for 0.8 mile with good gait mechanics in home community to improve walking endurance.    Baseline  Pt. reports he can walk 0.2 mile in his mobile home community.  3/25: increase walking endurance but not to baseline.    Time  4    Period  Weeks    Status  Partially Met    Target Date  05/11/20      PT LONG TERM GOAL #2   Title  Pt. will improve FOTO score to > 57 to show improvements in LE function for ADLs    Baseline  41.  3/22: 44    Time  4    Period  Weeks    Status  On-going    Target Date  05/11/20      PT LONG TERM GOAL #3   Title  Pt. will increase R LE strength to grossly 5/5 to improve gait mechanics and LE strength for ADLs    Baseline  R hip flexion 4-/5, knee flexion 4+/5, ankle everison 4+/5.  3/25:  hip flexion 4/5 MMT.  5/10: 4/5 MMT.  Limited B shoulder MMT  due to pain.    Time  4    Period  Weeks    Status  Partially Met    Target Date  05/11/20      PT LONG TERM GOAL #4   Title  Pt. will increase bilat UE flexion/abduction to 120 deg. for functional mobility for overhead reaching and bathing.    Baseline  R Flexion 79 with pain in deltoid, R Abduction 108; L flexion 131, L Abduction 109.  2/23: R sh. flexion >100 deg. (pain limited).  3/25:  R sh. flexion 134 deg./ abduction 114 deg.  L sh. flexion 130 deg./ abduction 128 deg. (consistent improvement).    Time  4    Period  Weeks    Status  Achieved    Target Date  02/27/20      PT LONG TERM GOAL #5   Title  Pt. able to ambulate outside on grassy terrain with consistent gait pattern and no assistive device safely.    Baseline  Pt. requires use of SPC for safety with gait.    Time  4    Period  Weeks    Status  New    Target Date  05/11/20            Plan - 04/13/20 1955    Clinical Impression Statement  Pt. referred back to PT to focus on B LE/ UE muscle strengthening and improved gait pattern/ balance.  Pt. reports no recent falls or LOB while at beach.  Pt. has received injections in B shoulders for pain mgmt. with minimal benefit.  Pt. present with functional B shoulder ROM but pain limited overhead.  Pt. continues to ambulate with use of SPC for safety with all aspects of gait, esp. in grass/ outside.  Pt. will benefit from additional skilled PT services to increase generalized strengthening and balance training.  See updated goals.    Personal Factors and Comorbidities  Comorbidity 2    Comorbidities  Hypertension, Obesity    Examination-Activity Limitations  Bed Mobility;Reach Overhead;Squat;Lift    Examination-Participation Restrictions  Community Activity;Yard Work    Stability/Clinical  Decision Making  Evolving/Moderate complexity    Clinical Decision Making  Moderate    Rehab Potential  Good    PT Frequency  2x / week    PT Duration  4 weeks    PT  Treatment/Interventions  ADLs/Self Care Home Management;Gait training;Stair training;Functional mobility training;Therapeutic activities;Therapeutic exercise;Balance training;Neuromuscular re-education;Manual techniques;Patient/family education    PT Next Visit Plan  B LE/UE strengthening and balance training.    Consulted and Agree with Plan of Care  Patient       Patient will benefit from skilled therapeutic intervention in order to improve the following deficits and impairments:  Abnormal gait, Decreased activity tolerance, Decreased balance, Decreased coordination, Decreased mobility, Decreased endurance, Decreased range of motion, Decreased strength, Difficulty walking, Dizziness, Impaired UE functional use, Pain  Visit Diagnosis: Other abnormalities of gait and mobility  Unspecified lack of coordination  Muscle weakness (generalized)     Problem List Patient Active Problem List   Diagnosis Date Noted  . Hemiparesis affecting right side as late effect of cerebrovascular accident ([place]) 08/13/2019  . Other sleep apnea 08/06/2019  . History of kidney stones 05/02/2018  . ED (erectile dysfunction) 05/02/2018  . Hyperglycemia 01/30/2017  . Arthritis of knee, degenerative 01/30/2017  . BPH with obstruction/lower urinary tract symptoms 02/03/2016  . Gross hematuria 02/03/2016  . Dyslipidemia 08/04/2015  . Skin lesions 07/14/2015  . Diverticulitis of large intestine without perforation or abscess without bleeding 06/30/2015  . Edema 06/30/2015  . OSA on CPAP 06/30/2015  . Kidney pain 06/30/2015   Pura Spice, PT, DPT # [id number] 04/13/2020, 8:04 PM  [facility] [address] [place], [place], [address] Phone: [phone]   Fax:  [phone]  Name: Tom Underwood MRN: [id number] Date of Birth: 1951-06-27

## 2020-04-16 ENCOUNTER — Encounter: Payer: Self-pay | Admitting: Physical Therapy

## 2020-04-16 ENCOUNTER — Other Ambulatory Visit: Payer: Self-pay

## 2020-04-16 ENCOUNTER — Ambulatory Visit: Payer: Medicare Other | Admitting: Physical Therapy

## 2020-04-16 DIAGNOSIS — M6281 Muscle weakness (generalized): Secondary | ICD-10-CM | POA: Diagnosis not present

## 2020-04-16 DIAGNOSIS — R2689 Other abnormalities of gait and mobility: Secondary | ICD-10-CM

## 2020-04-16 DIAGNOSIS — R279 Unspecified lack of coordination: Secondary | ICD-10-CM | POA: Diagnosis not present

## 2020-04-19 NOTE — Therapy (Signed)
[facility] [address] [place], [place], [address] Phone: [phone]   Fax:  [phone]  Physical Therapy Treatment  Patient Details  Name: Tom Underwood MRN: [id number] Date of Birth: [birth year] Referring Provider (PT): Jennings Books, MD   Encounter Date: 04/16/2020  Treatment: 29 of 36.  Recert date: 05/09/4649 3546 to 1520  Past Medical History:  Diagnosis Date  . Decreased libido   . Diverticulitis   . HLD (hyperlipidemia)   . Hydronephrosis with renal and ureteral calculus obstruction   . Hydronephrosis with renal and ureteral calculus obstruction   . Hypogonadism in male   . Rhinitis, allergic   . Sleep apnea   . Ureteral stone     Past Surgical History:  Procedure Laterality Date  . EXTERNAL EAR SURGERY    . FINGER SURGERY    . KNEE SURGERY    . left eye      There were no vitals filed for this visit.    Pt. states he has decrease heaviness in B feet over past few days. Pt. states his R ankle has more "popping" and crepitus.      There.ex.:  Nustep L6 B UE/LE 10 min. (0.5 miles in 9 mins.)- quick cadence with  Standing B shoulder AROM (all planes)- pain limited.  Standing GTB: scap. Retraction/ sh. Extension/ bicep curls 20x each.   Nautilus: resisted walking 70# 3x all 4-planes.  CGA for safety/ cuing Seated alt. UE/LE with mirror feedback/ posture correction.   Walking in clinic with high marching/ outside on varying terrain working on consistent gait pattern/ BOS.      PT Long Term Goals - 04/13/20 2001      PT LONG TERM GOAL #1   Title  Pt. will be able to ambulate with least assistive device for 0.8 mile with good gait mechanics in home community to improve walking endurance.    Baseline  Pt. reports he can walk 0.2 mile in his mobile home community.  3/25: increase walking endurance but not to baseline.    Time  4    Period  Weeks    Status  Partially Met    Target Date  05/11/20      PT LONG TERM GOAL #2   Title  Pt. will improve FOTO score to > 57 to show improvements in LE function for ADLs    Baseline  41.  3/22: 44    Time  4    Period  Weeks    Status  On-going    Target Date  05/11/20      PT LONG TERM GOAL #3   Title  Pt. will increase R LE strength to grossly 5/5 to improve gait mechanics and LE strength for ADLs    Baseline  R hip flexion 4-/5, knee flexion 4+/5, ankle everison 4+/5.  3/25:  hip flexion 4/5 MMT.  5/10: 4/5 MMT.  Limited B shoulder MMT due to pain.    Time  4    Period  Weeks    Status  Partially Met    Target Date  05/11/20      PT LONG TERM GOAL #4   Title  Pt. will increase bilat UE flexion/abduction to 120 deg. for functional mobility for overhead reaching and bathing.    Baseline  R Flexion 79 with pain in deltoid, R Abduction 108; L flexion 131, L Abduction 109.  2/23: R sh. flexion >100 deg. (pain limited).  3/25:  R sh. flexion 134 deg./ abduction 114 deg.  L sh. flexion 130 deg./ abduction 128 deg. (consistent improvement).    Time  4    Period  Weeks    Status  Achieved    Target Date  02/27/20      PT LONG TERM GOAL #5   Title  Pt. able to ambulate outside on grassy terrain with consistent gait pattern and no assistive device safely.    Baseline  Pt. requires use of SPC for safety with gait.    Time  4    Period  Weeks    Status  New    Target Date  05/11/20         Pt. remains pain limited with B shoulder AROM >90 deg. flexion/ abduction. Pt. reports decrease heaviness in B feet while walking around PT clinic with use of SPC. Good muscle endurance/ cadence on Nustep with B UE/LE at L6. PT progressed B UE resisted ther.ex. with use of GTB today with cuing for proper technique/ upright posture. Pt. benefits from demonstration and mirror feedback to ensure proper technique.      Patient will benefit from skilled therapeutic intervention in order to improve the following deficits and impairments:  Abnormal gait,  Decreased activity tolerance, Decreased balance, Decreased coordination, Decreased mobility, Decreased endurance, Decreased range of motion, Decreased strength, Difficulty walking, Dizziness, Impaired UE functional use, Pain  Visit Diagnosis: Unspecified lack of coordination  Other abnormalities of gait and mobility  Muscle weakness (generalized)     Problem List Patient Active Problem List   Diagnosis Date Noted  . Hemiparesis affecting right side as late effect of cerebrovascular accident ([place]) 08/13/2019  . Other sleep apnea 08/06/2019  . History of kidney stones 05/02/2018  . ED (erectile dysfunction) 05/02/2018  . Hyperglycemia 01/30/2017  . Arthritis of knee, degenerative 01/30/2017  . BPH with obstruction/lower urinary tract symptoms 02/03/2016  . Gross hematuria 02/03/2016  . Dyslipidemia 08/04/2015  . Skin lesions 07/14/2015  . Diverticulitis of large intestine without perforation or abscess without bleeding 06/30/2015  . Edema 06/30/2015  . OSA on CPAP 06/30/2015  . Kidney pain 06/30/2015   Pura Spice, PT, DPT # [id number] 04/19/2020, 7:37 PM  [facility] [address] [place], [place], [address] Phone: [phone]   Fax:  [phone]  Name: Tom Underwood MRN: [id number] Date of Birth: 1951/07/03

## 2020-04-20 ENCOUNTER — Ambulatory Visit: Payer: Medicare Other

## 2020-04-20 ENCOUNTER — Other Ambulatory Visit: Payer: Self-pay

## 2020-04-20 DIAGNOSIS — R2689 Other abnormalities of gait and mobility: Secondary | ICD-10-CM | POA: Diagnosis not present

## 2020-04-20 DIAGNOSIS — R279 Unspecified lack of coordination: Secondary | ICD-10-CM

## 2020-04-20 DIAGNOSIS — M6281 Muscle weakness (generalized): Secondary | ICD-10-CM

## 2020-04-20 NOTE — Therapy (Signed)
[facility] [address] [place], [place], [address] Phone: [phone]   Fax:  [phone]  Physical Therapy Treatment  Patient Details  Name: Tom Underwood MRN: [id number] Date of Birth: [birth year] Referring Provider (PT): Jennings Books, MD   Encounter Date: 04/20/2020  PT End of Session - 04/20/20 0849    Visit Number  30    Number of Visits  36    Date for PT Re-Evaluation  05/11/20    Authorization - Visit Number  3    Authorization - Number of Visits  10    PT Start Time  [phone]    PT Stop Time  0930    PT Time Calculation (min)  47 min    Activity Tolerance  Patient tolerated treatment well;Patient limited by pain    Behavior During Therapy  [facility] for tasks assessed/performed       Past Medical History:  Diagnosis Date  . Decreased libido   . Diverticulitis   . HLD (hyperlipidemia)   . Hydronephrosis with renal and ureteral calculus obstruction   . Hydronephrosis with renal and ureteral calculus obstruction   . Hypogonadism in male   . Rhinitis, allergic   . Sleep apnea   . Ureteral stone     Past Surgical History:  Procedure Laterality Date  . EXTERNAL EAR SURGERY    . FINGER SURGERY    . KNEE SURGERY    . left eye      There were no vitals filed for this visit.  Subjective Assessment - 04/20/20 0848    Subjective  pt rates shoulder pain as 5/10 this morning R>L; less heaviness in R leg in the past few weeks        There.ex.:  Nustep L6 B UE/LE 10 min. (0.5 miles in 9:20 mins.)- quick cadence emphasis today Standing B shoulder AROM (all planes)- pain limited.  Standing GTB: scap. Retraction/ sh. Extension/ bicep curls 20x each.   Seated alt. UE/LE with mirror feedback/ posture correction.   Nautilus rows: single arm row 2x10 R and L  Neuro-re ed: Airex/obstacle course in parallel bars with high hurdles- focused on high step overs and then step up on airex with various dual task activities:  marches/UE diagonals holding small ball/alternate reciprocal arm swings.  Pt only required occasional UE touch to parallel bars for stability.     PT Long Term Goals - 04/13/20 2001      PT LONG TERM GOAL #1   Title  Pt. will be able to ambulate with least assistive device for 0.8 mile with good gait mechanics in home community to improve walking endurance.    Baseline  Pt. reports he can walk 0.2 mile in his mobile home community.  3/25: increase walking endurance but not to baseline.    Time  4    Period  Weeks    Status  Partially Met    Target Date  05/11/20      PT LONG TERM GOAL #2   Title  Pt. will improve FOTO score to > 57 to show improvements in LE function for ADLs    Baseline  41.  3/22: 44    Time  4    Period  Weeks    Status  On-going    Target Date  05/11/20      PT LONG TERM GOAL #3   Title  Pt. will increase R LE strength to grossly 5/5 to improve gait mechanics and  LE strength for ADLs    Baseline  R hip flexion 4-/5, knee flexion 4+/5, ankle everison 4+/5.  3/25:  hip flexion 4/5 MMT.  5/10: 4/5 MMT.  Limited B shoulder MMT due to pain.    Time  4    Period  Weeks    Status  Partially Met    Target Date  05/11/20      PT LONG TERM GOAL #4   Title  Pt. will increase bilat UE flexion/abduction to 120 deg. for functional mobility for overhead reaching and bathing.    Baseline  R Flexion 79 with pain in deltoid, R Abduction 108; L flexion 131, L Abduction 109.  2/23: R sh. flexion >100 deg. (pain limited).  3/25:  R sh. flexion 134 deg./ abduction 114 deg.  L sh. flexion 130 deg./ abduction 128 deg. (consistent improvement).    Time  4    Period  Weeks    Status  Achieved    Target Date  02/27/20      PT LONG TERM GOAL #5   Title  Pt. able to ambulate outside on grassy terrain with consistent gait pattern and no assistive device safely.    Baseline  Pt. requires use of SPC for safety with gait.    Time  4    Period  Weeks    Status  New    Target Date   05/11/20            Plan - 04/20/20 1229    Clinical Impression Statement  Pt's shoulder AROM limited by pain still at >90 degrees flexion today.  He was challenged with higher level dual-task balance activities today on airex; no loss of balance occurred during session today.  He does require verbal/visual cues for proper technique during UE exercises.    Personal Factors and Comorbidities  Comorbidity 2    Comorbidities  Hypertension, Obesity    Examination-Activity Limitations  Bed Mobility;Reach Overhead;Squat;Lift    Examination-Participation Restrictions  Community Activity;Yard Work    Merchant navy officer  Evolving/Moderate complexity    Rehab Potential  Good    PT Frequency  2x / week    PT Duration  4 weeks    PT Treatment/Interventions  ADLs/Self Care Home Management;Gait training;Stair training;Functional mobility training;Therapeutic activities;Therapeutic exercise;Balance training;Neuromuscular re-education;Manual techniques;Patient/family education    PT Next Visit Plan  B LE/UE strengthening and balance training.    Consulted and Agree with Plan of Care  Patient       Patient will benefit from skilled therapeutic intervention in order to improve the following deficits and impairments:  Abnormal gait, Decreased activity tolerance, Decreased balance, Decreased coordination, Decreased mobility, Decreased endurance, Decreased range of motion, Decreased strength, Difficulty walking, Dizziness, Impaired UE functional use, Pain  Visit Diagnosis: Other abnormalities of gait and mobility  Unspecified lack of coordination  Muscle weakness (generalized)     Problem List Patient Active Problem List   Diagnosis Date Noted  . Hemiparesis affecting right side as late effect of cerebrovascular accident ([place]) 08/13/2019  . Other sleep apnea 08/06/2019  . History of kidney stones 05/02/2018  . ED (erectile dysfunction) 05/02/2018  . Hyperglycemia 01/30/2017  .  Arthritis of knee, degenerative 01/30/2017  . BPH with obstruction/lower urinary tract symptoms 02/03/2016  . Gross hematuria 02/03/2016  . Dyslipidemia 08/04/2015  . Skin lesions 07/14/2015  . Diverticulitis of large intestine without perforation or abscess without bleeding 06/30/2015  . Edema 06/30/2015  . OSA on CPAP 06/30/2015  .  Kidney pain 06/30/2015    Pincus Badder 04/20/2020, 12:32 PM Merdis Delay, PT, DPT  [facility] [address] [place], [place], [address] Phone: [phone]   Fax:  [phone]  Name: Tom Underwood MRN: [id number] Date of Birth: 06/27/1951

## 2020-04-21 ENCOUNTER — Ambulatory Visit (INDEPENDENT_AMBULATORY_CARE_PROVIDER_SITE_OTHER): Payer: Medicare Other

## 2020-04-21 VITALS — BP 126/74 | HR 91 | Temp 96.9°F | Resp 17 | Ht 71.0 in | Wt 292.4 lb

## 2020-04-21 DIAGNOSIS — Z0000 Encounter for general adult medical examination without abnormal findings: Secondary | ICD-10-CM | POA: Diagnosis not present

## 2020-04-21 NOTE — Progress Notes (Signed)
Subjective:   Tom Underwood is a 69 y.o. male who presents for an Initial Medicare Annual Wellness Visit.  Review of Systems   Cardiac Risk Factors include: advanced age (>48men, >66 women);dyslipidemia;male gender;hypertension;obesity (BMI >30kg/m2)    Objective:    Today's Vitals   04/21/20 0932  BP: 126/74  Pulse: 91  Resp: 17  Temp: (!) 96.9 F (36.1 C)  TempSrc: Temporal  SpO2: 95%  Weight: 292 lb 6.4 oz (132.6 kg)  Height: 5\' 11"  (1.803 m)   Body mass index is 40.78 kg/m.  Advanced Directives 04/21/2020 08/30/2017 06/02/2017 02/24/2017 01/30/2017 08/03/2015 07/14/2015  Does Patient Have a Medical Advance Directive? No No No No No Yes Yes  Type of Advance Directive - - - - - Press photographer;Living will [place];Living will  Would patient like information on creating a medical advance directive? Yes (MAU/Ambulatory/Procedural Areas - Information given) - - - - - -    Current Medications (verified) Outpatient Encounter Medications as of 04/21/2020  Medication Sig  . acetaminophen (TYLENOL) 650 MG CR tablet Take 650 mg by mouth every 8 (eight) hours as needed for pain.  Marland Kitchen amitriptyline (ELAVIL) 25 MG tablet Take 1 tablet (25 mg total) by mouth at bedtime as needed. for sleep  . aspirin EC 81 MG tablet Take 1 tablet (81 mg total) by mouth daily.  . Cetirizine HCl 10 MG CAPS   . clopidogrel (PLAVIX) 75 MG tablet Take 1 tablet by mouth daily.  Marland Kitchen glucosamine-chondroitin 500-400 MG tablet Take 1 tablet by mouth 3 (three) times daily.  . meloxicam (MOBIC) 15 MG tablet as needed  . Multiple Vitamin (MULTIVITAMIN) tablet Take 1 tablet by mouth daily.  Marland Kitchen omega-3 acid ethyl esters (LOVAZA) 1 g capsule Take 2 capsules (2 g total) by mouth 2 (two) times daily.  . pantoprazole (PROTONIX) 20 MG tablet Take by mouth.  . rosuvastatin (CRESTOR) 20 MG tablet Take 20 mg by mouth daily.  . Saccharomyces boulardii (PROBIOTIC) 250 MG CAPS Take 1 capsule by  mouth daily.  . sildenafil (REVATIO) 20 MG tablet Take 3 to 5 tablets two hours before intercouse on an empty stomach.  Do not take with nitrates.  . valsartan (DIOVAN) 80 MG tablet Take 1 tablet (80 mg total) by mouth daily.   No facility-administered encounter medications on file as of 04/21/2020.    Allergies (verified) Penicillins   History: Past Medical History:  Diagnosis Date  . Allergy   . Decreased libido   . Diverticulitis   . GERD (gastroesophageal reflux disease)   . HLD (hyperlipidemia)   . Hydronephrosis with renal and ureteral calculus obstruction   . Hydronephrosis with renal and ureteral calculus obstruction   . Hypertension   . Hypogonadism in male   . Rhinitis, allergic   . Sleep apnea   . Stroke ([place])   . Ureteral stone    Past Surgical History:  Procedure Laterality Date  . EXTERNAL EAR SURGERY    . FINGER SURGERY    . KNEE SURGERY    . left eye     Family History  Problem Relation Age of Onset  . Asthma Mother   . Heart disease Mother   . Heart attack Mother   . Dementia Mother   . Asthma Father   . Heart disease Father   . Stroke Father    Social History   Socioeconomic History  . Marital status: Married    Spouse name: Butch Penny   .  Number of children: 2  . Years of education: Not on file  . Highest education level: Not on file  Occupational History  . Occupation: retired   Tobacco Use  . Smoking status: Former Smoker    Types: Pipe    Start date: 02/20/1973    Quit date: 02/20/1974    Years since quitting: 46.1  . Smokeless tobacco: Never Used  Substance and Sexual Activity  . Alcohol use: No    Alcohol/week: 0.0 standard drinks  . Drug use: No  . Sexual activity: Yes    Partners: Female  Other Topics Concern  . Not on file  Social History Narrative  . Not on file   Social Determinants of Health   Financial Resource Strain: Low Risk   . Difficulty of Paying Living Expenses: Not hard at all  Food Insecurity: No Food  Insecurity  . Worried About Programme researcher, broadcasting/film/video in the Last Year: Never true  . Ran Out of Food in the Last Year: Never true  Transportation Needs: No Transportation Needs  . Lack of Transportation (Medical): No  . Lack of Transportation (Non-Medical): No  Physical Activity: Insufficiently Active  . Days of Exercise per Week: 2 days  . Minutes of Exercise per Session: 50 min  Stress: No Stress Concern Present  . Feeling of Stress : Not at all  Social Connections: Not Isolated  . Frequency of Communication with Friends and Family: More than three times a week  . Frequency of Social Gatherings with Friends and Family: Twice a week  . Attends Religious Services: More than 4 times per year  . Active Member of Clubs or Organizations: Yes  . Attends Banker Meetings: More than 4 times per year  . Marital Status: Married   Tobacco Counseling Counseling given: Not Answered   Clinical Intake:  Pre-visit preparation completed: Yes  Pain : 0-10 Pain Type: Chronic pain Pain Location: Arm Pain Orientation: Right, Left, Upper Pain Descriptors / Indicators: Aching, Sore Pain Onset: More than a month ago Pain Frequency: Constant     BMI - recorded: 40.78 Nutritional Status: BMI > 30  Obese Nutritional Risks: None Diabetes: No  How often do you need to have someone help you when you read instructions, pamphlets, or other written materials from your doctor or pharmacy?: 1 - Never  Interpreter Needed?: No  Information entered by :: Reather Littler LPN  Activities of Daily Living In your present state of health, do you have any difficulty performing the following activities: 04/21/2020 03/26/2020  Hearing? N N  Comment declines hearing aids -  Vision? Y Y  Difficulty concentrating or making decisions? N N  Walking or climbing stairs? Y Y  Dressing or bathing? Y Y  Doing errands, shopping? N N  Preparing Food and eating ? N -  Using the Toilet? N -  In the past six  months, have you accidently leaked urine? N -  Do you have problems with loss of bowel control? N -  Managing your Medications? N -  Managing your Finances? N -  Housekeeping or managing your Housekeeping? N -  Some recent data might be hidden     Immunizations and Health Maintenance Immunization History  Administered Date(s) Administered  . Fluad Quad(high Dose 65+) 09/18/2019  . Influenza, High Dose Seasonal PF 10/05/2018  . Influenza,inj,Quad PF,6+ Mos 08/30/2017  . Influenza-Unspecified 09/26/2016  . Moderna SARS-COVID-2 Vaccination 01/21/2020, 02/18/2020  . Pneumococcal Conjugate-13 11/05/2019  . Pneumococcal Polysaccharide-23 10/19/2018  .  Tdap 02/16/2018  . Zoster 02/24/2017   There are no preventive care reminders to display for this patient.  Patient Care Team: Alba Cory, MD as PCP - General (Family Medicine) Helane Gunther, DPM as Consulting Physician (Podiatry) Marvel Plan Wellington Hampshire, PA-C as Physician Assistant (Urology)  Indicate any recent Medical Services you may have received from other than [facility] providers in the past year (date may be approximate).    Assessment:   This is a routine wellness examination for [place].  Hearing/Vision screen  Hearing Screening   125Hz  250Hz  500Hz  1000Hz  2000Hz  3000Hz  4000Hz  6000Hz  8000Hz   Right ear:           Left ear:           Comments: Pt denies hearing difficulty  Vision Screening Comments: Annual vision screenings done at [facility] in [place]  Dietary issues and exercise activities discussed: Current Exercise Habits: Home exercise routine, Type of exercise: Other - see comments;walking(physical therapy & exercise bike), Time (Minutes): 50, Frequency (Times/Week): 2, Weekly Exercise (Minutes/Week): 100, Intensity: Moderate, Exercise limited by: neurologic condition(s);orthopedic condition(s)  Goals    . Patient Stated (pt-stated)     Patient would like to increase strength and mobility over the next year while  continuing to recover from stroke. Patient has a goal to walk one mile and bicycle one mile.       Depression Screen PHQ 2/9 Scores 04/21/2020 03/26/2020 11/05/2019 09/18/2019  PHQ - 2 Score 0 0 0 0  PHQ- 9 Score - 0 0 0    Fall Risk Fall Risk  04/21/2020 03/26/2020 11/05/2019 06/24/2019 02/21/2019  Falls in the past year? 0 0 0 0 0  Number falls in past yr: 0 0 0 - 0  Injury with Fall? 0 0 0 - 0  Risk for fall due to : Impaired balance/gait - - - -  Follow up Falls prevention discussed - - - -    FALL RISK PREVENTION PERTAINING TO THE HOME:  Any stairs in or around the home? Yes  If so, do they handrails? Yes   Home free of loose throw rugs in walkways, pet beds, electrical cords, etc? Yes  Adequate lighting in your home to reduce risk of falls? Yes   ASSISTIVE DEVICES UTILIZED TO PREVENT FALLS:  Life alert? No  Use of a cane, walker or w/c? Yes  Grab bars in the bathroom? Yes  Shower chair or bench in shower? Yes  Elevated toilet seat or a handicapped toilet? Yes   DME ORDERS:  DME order needed?  No   TIMED UP AND GO:  Was the test performed? Yes .  Length of time to ambulate 10 feet: 8 sec.   GAIT:  Appearance of gait: Gait slow, steady and with the use of an assistive device.   Education: Fall risk prevention has been discussed.  Intervention(s) required? No    Cognitive Function:      6CIT Screen 04/21/2020  What Year? 0 points  What month? 0 points  What time? 0 points  Count back from 20 0 points  Months in reverse 0 points  Repeat phrase 0 points  Total Score 0    Screening Tests Health Maintenance  Topic Date Due  . INFLUENZA VACCINE  07/05/2020  . COLONOSCOPY  09/27/2023  . TETANUS/TDAP  02/17/2028  . COVID-19 Vaccine  Completed  . Hepatitis C Screening  Completed  . PNA vac Low Risk Adult  Completed    Qualifies for Shingles Vaccine? Yes  Zostavax  completed 2018. Due for Shingrix. Education has been provided regarding the importance of this  vaccine. Pt has been advised to call insurance company to determine out of pocket expense. Advised may also receive vaccine at local pharmacy or Health Dept. Verbalized acceptance and understanding.  Tdap: Up to date  Flu Vaccine: Up to date  Pneumococcal Vaccine: Up to date  Covid-19 Vaccine: Up to date   Cancer Screenings:  Colorectal Screening: Completed 09/26/13. Repeat every 10 years;   Lung Cancer Screening: (Low Dose CT Chest recommended if Age 26-80 years, 30 pack-year currently smoking OR have quit w/in 15years.) does not qualify.    Additional Screening:  Hepatitis C Screening: does qualify; Completed 02/16/18  Vision Screening: Recommended annual ophthalmology exams for early detection of glaucoma and other disorders of the eye. Is the patient up to date with their annual eye exam?  Yes  Who is the provider or what is the name of the office in which the pt attends annual eye exams? [facility]  Dental Screening: Recommended annual dental exams for proper oral hygiene  Community Resource Referral:  CRR required this visit?  No         Plan:    I have personally reviewed and addressed the Medicare Annual Wellness questionnaire and have noted the following in the patient's chart:  A. Medical and social history B. Use of alcohol, tobacco or illicit drugs  C. Current medications and supplements D. Functional ability and status E.  Nutritional status F.  Physical activity G. Advance directives H. List of other physicians I.  Hospitalizations, surgeries, and ER visits in previous 12 months J.  Vitals K. Screenings such as hearing and vision if needed, cognitive and depression L. Referrals and appointments   In addition, I have reviewed and discussed with patient certain preventive protocols, quality metrics, and best practice recommendations. A written personalized care plan for preventive services as well as general preventive health recommendations were provided to  patient.   Signed,  Reather Littler, LPN Nurse Health Advisor   Nurse Notes: none

## 2020-04-21 NOTE — Patient Instructions (Addendum)
Tom Underwood , Thank you for taking time to come for your Medicare Wellness Visit. I appreciate your ongoing commitment to your health goals. Please review the following plan we discussed and let me know if I can assist you in the future.   Screening recommendations/referrals: Colonoscopy: done 09/26/13 Recommended yearly ophthalmology/optometry visit for glaucoma screening and checkup Recommended yearly dental visit for hygiene and checkup   Vaccinations: Influenza vaccine: done 09/18/19 Pneumococcal vaccine: done 11/05/19 Tdap vaccine: done 02/16/18 Shingles vaccine: Shingrix discussed. Please contact your pharmacy for coverage information.  Covid-19: done 01/21/20 & 02/18/20  Advanced directives: Advance directive discussed with you today. I have provided a copy for you to complete at home and have notarized. Once this is complete please bring a copy in to our office so we can scan it into your chart.  Conditions/risks identified: Recommend continuing healthy eating and increasing physical activity as tolerated  Next appointment: Please follow up in one year for your Medicare Annual Wellness visit.    Preventive Care 67 Years and Older, Male Preventive care refers to lifestyle choices and visits with your health care provider that can promote health and wellness. What does preventive care include?  A yearly physical exam. This is also called an annual well check.  Dental exams once or twice a year.  Routine eye exams. Ask your health care provider how often you should have your eyes checked.  Personal lifestyle choices, including:  Daily care of your teeth and gums.  Regular physical activity.  Eating a healthy diet.  Avoiding tobacco and drug use.  Limiting alcohol use.  Practicing safe sex.  Taking low doses of aspirin every day.  Taking vitamin and mineral supplements as recommended by your health care provider. What happens during an annual well check? The  services and screenings done by your health care provider during your annual well check will depend on your age, overall health, lifestyle risk factors, and family history of disease. Counseling  Your health care provider may ask you questions about your:  Alcohol use.  Tobacco use.  Drug use.  Emotional well-being.  Home and relationship well-being.  Sexual activity.  Eating habits.  History of falls.  Memory and ability to understand (cognition).  Work and work Astronomer. Screening  You may have the following tests or measurements:  Height, weight, and BMI.  Blood pressure.  Lipid and cholesterol levels. These may be checked every 5 years, or more frequently if you are over 45 years old.  Skin check.  Lung cancer screening. You may have this screening every year starting at age 39 if you have a 30-pack-year history of smoking and currently smoke or have quit within the past 15 years.  Fecal occult blood test (FOBT) of the stool. You may have this test every year starting at age 70.  Flexible sigmoidoscopy or colonoscopy. You may have a sigmoidoscopy every 5 years or a colonoscopy every 10 years starting at age 26.  Prostate cancer screening. Recommendations will vary depending on your family history and other risks.  Hepatitis C blood test.  Hepatitis B blood test.  Sexually transmitted disease (STD) testing.  Diabetes screening. This is done by checking your blood sugar (glucose) after you have not eaten for a while (fasting). You may have this done every 1-3 years.  Abdominal aortic aneurysm (AAA) screening. You may need this if you are a current or former smoker.  Osteoporosis. You may be screened starting at age 10 if you are at  high risk. Talk with your health care provider about your test results, treatment options, and if necessary, the need for more tests. Vaccines  Your health care provider may recommend certain vaccines, such as:  Influenza  vaccine. This is recommended every year.  Tetanus, diphtheria, and acellular pertussis (Tdap, Td) vaccine. You may need a Td booster every 10 years.  Zoster vaccine. You may need this after age 89.  Pneumococcal 13-valent conjugate (PCV13) vaccine. One dose is recommended after age 7.  Pneumococcal polysaccharide (PPSV23) vaccine. One dose is recommended after age 47. Talk to your health care provider about which screenings and vaccines you need and how often you need them. This information is not intended to replace advice given to you by your health care provider. Make sure you discuss any questions you have with your health care provider. Document Released: 12/18/2015 Document Revised: 08/10/2016 Document Reviewed: 09/22/2015 Elsevier Interactive Patient Education  2017 [employer] Prevention in the Home Falls can cause injuries. They can happen to people of all ages. There are many things you can do to make your home safe and to help prevent falls. What can I do on the outside of my home?  Regularly fix the edges of walkways and driveways and fix any cracks.  Remove anything that might make you trip as you walk through a door, such as a raised step or threshold.  Trim any bushes or trees on the path to your home.  Use bright outdoor lighting.  Clear any walking paths of anything that might make someone trip, such as rocks or tools.  Regularly check to see if handrails are loose or broken. Make sure that both sides of any steps have handrails.  Any raised decks and porches should have guardrails on the edges.  Have any leaves, snow, or ice cleared regularly.  Use sand or salt on walking paths during winter.  Clean up any spills in your garage right away. This includes oil or grease spills. What can I do in the bathroom?  Use night lights.  Install grab bars by the toilet and in the tub and shower. Do not use towel bars as grab bars.  Use non-skid mats or decals  in the tub or shower.  If you need to sit down in the shower, use a plastic, non-slip stool.  Keep the floor dry. Clean up any water that spills on the floor as soon as it happens.  Remove soap buildup in the tub or shower regularly.  Attach bath mats securely with double-sided non-slip rug tape.  Do not have throw rugs and other things on the floor that can make you trip. What can I do in the bedroom?  Use night lights.  Make sure that you have a light by your bed that is easy to reach.  Do not use any sheets or blankets that are too big for your bed. They should not hang down onto the floor.  Have a firm chair that has side arms. You can use this for support while you get dressed.  Do not have throw rugs and other things on the floor that can make you trip. What can I do in the kitchen?  Clean up any spills right away.  Avoid walking on wet floors.  Keep items that you use a lot in easy-to-reach places.  If you need to reach something above you, use a strong step stool that has a grab bar.  Keep electrical cords out of the  way.  Do not use floor polish or wax that makes floors slippery. If you must use wax, use non-skid floor wax.  Do not have throw rugs and other things on the floor that can make you trip. What can I do with my stairs?  Do not leave any items on the stairs.  Make sure that there are handrails on both sides of the stairs and use them. Fix handrails that are broken or loose. Make sure that handrails are as long as the stairways.  Check any carpeting to make sure that it is firmly attached to the stairs. Fix any carpet that is loose or worn.  Avoid having throw rugs at the top or bottom of the stairs. If you do have throw rugs, attach them to the floor with carpet tape.  Make sure that you have a light switch at the top of the stairs and the bottom of the stairs. If you do not have them, ask someone to add them for you. What else can I do to help  prevent falls?  Wear shoes that:  Do not have high heels.  Have rubber bottoms.  Are comfortable and fit you well.  Are closed at the toe. Do not wear sandals.  If you use a stepladder:  Make sure that it is fully opened. Do not climb a closed stepladder.  Make sure that both sides of the stepladder are locked into place.  Ask someone to hold it for you, if possible.  Clearly mark and make sure that you can see:  Any grab bars or handrails.  First and last steps.  Where the edge of each step is.  Use tools that help you move around (mobility aids) if they are needed. These include:  Canes.  Walkers.  Scooters.  Crutches.  Turn on the lights when you go into a dark area. Replace any light bulbs as soon as they burn out.  Set up your furniture so you have a clear path. Avoid moving your furniture around.  If any of your floors are uneven, fix them.  If there are any pets around you, be aware of where they are.  Review your medicines with your doctor. Some medicines can make you feel dizzy. This can increase your chance of falling. Ask your doctor what other things that you can do to help prevent falls. This information is not intended to replace advice given to you by your health care provider. Make sure you discuss any questions you have with your health care provider. Document Released: 09/17/2009 Document Revised: 04/28/2016 Document Reviewed: 12/26/2014 Elsevier Interactive Patient Education  2017 [employer].

## 2020-04-29 ENCOUNTER — Other Ambulatory Visit: Payer: Self-pay

## 2020-04-29 ENCOUNTER — Ambulatory Visit: Payer: Medicare Other | Admitting: Physical Therapy

## 2020-04-29 DIAGNOSIS — R2689 Other abnormalities of gait and mobility: Secondary | ICD-10-CM

## 2020-04-29 DIAGNOSIS — M6281 Muscle weakness (generalized): Secondary | ICD-10-CM

## 2020-04-29 DIAGNOSIS — R279 Unspecified lack of coordination: Secondary | ICD-10-CM

## 2020-05-01 ENCOUNTER — Ambulatory Visit: Payer: Medicare Other | Admitting: Physical Therapy

## 2020-05-01 ENCOUNTER — Other Ambulatory Visit: Payer: Self-pay

## 2020-05-01 DIAGNOSIS — M6281 Muscle weakness (generalized): Secondary | ICD-10-CM

## 2020-05-01 DIAGNOSIS — R279 Unspecified lack of coordination: Secondary | ICD-10-CM

## 2020-05-01 DIAGNOSIS — R2689 Other abnormalities of gait and mobility: Secondary | ICD-10-CM | POA: Diagnosis not present

## 2020-05-01 NOTE — Therapy (Addendum)
[facility] [address] [place], [place], [address] Phone: [phone]   Fax:  [phone]  Physical Therapy Treatment  Patient Details  Name: Tom Underwood MRN: [id number] Date of Birth: December 30, 1950 Referring Provider (PT): Jennings Books, MD   Encounter Date: 04/29/2020  PT End of Session - 05/01/20 1537    Visit Number  31    Number of Visits  36    Date for PT Re-Evaluation  05/11/20    Authorization - Visit Number  4    Authorization - Number of Visits  10    PT Start Time  8588    PT Stop Time  1436    PT Time Calculation (min)  59 min    Activity Tolerance  Patient tolerated treatment well;Patient limited by pain    Behavior During Therapy  [facility] for tasks assessed/performed       Past Medical History:  Diagnosis Date  . Allergy   . Decreased libido   . Diverticulitis   . GERD (gastroesophageal reflux disease)   . HLD (hyperlipidemia)   . Hydronephrosis with renal and ureteral calculus obstruction   . Hydronephrosis with renal and ureteral calculus obstruction   . Hypertension   . Hypogonadism in male   . Rhinitis, allergic   . Sleep apnea   . Stroke ([place])   . Ureteral stone     Past Surgical History:  Procedure Laterality Date  . EXTERNAL EAR SURGERY    . FINGER SURGERY    . KNEE SURGERY    . left eye      There were no vitals filed for this visit.  Subjective Assessment - 05/02/20 1524    Subjective  Pt. states he strained L shoulder while in shower (5/10 pain).  Pt. continues to report persistent B shoulder pain (L worse than R).    Pertinent History  Pt. had a right sided stroke and loves to watch football and travel to the beach with his wife.    Limitations  Walking;Standing    How long can you walk comfortably?  0.2 miles before heaviness/fatigue    Patient Stated Goals  Increase walking distance, carry things with less pain in shoulder    Currently in Pain?  Yes    Pain Score  5     Pain  Location  Shoulder    Pain Orientation  Left         There.ex.:  Nustep L5 10 min. (consistent cadence/ increase SOB)- no increase c/o sh. Pain. Seated/ standing Nautilus: 40# lat. Pull downs/ 30# tricep ext./ 40# scap. Retraction 20x (with cuing for posture/ balance) Nautilus: resisted gait 70# 3x each direction (SBA for safety/ cuing for BOS/ step pattern).    Neuro:  Airex step ups/overs and green hurdles at agility ladder (CGA for safety). Forward/ backward/ lateral walking in //-bars (posture correction) Walking outside on grass/ curb step ups    PT Long Term Goals - 04/13/20 2001      PT LONG TERM GOAL #1   Title  Pt. will be able to ambulate with least assistive device for 0.8 mile with good gait mechanics in home community to improve walking endurance.    Baseline  Pt. reports he can walk 0.2 mile in his mobile home community.  3/25: increase walking endurance but not to baseline.    Time  4    Period  Weeks    Status  Partially Met    Target Date  05/11/20      PT LONG TERM GOAL #2   Title  Pt. will improve FOTO score to > 57 to show improvements in LE function for ADLs    Baseline  41.  3/22: 44    Time  4    Period  Weeks    Status  On-going    Target Date  05/11/20      PT LONG TERM GOAL #3   Title  Pt. will increase R LE strength to grossly 5/5 to improve gait mechanics and LE strength for ADLs    Baseline  R hip flexion 4-/5, knee flexion 4+/5, ankle everison 4+/5.  3/25:  hip flexion 4/5 MMT.  5/10: 4/5 MMT.  Limited B shoulder MMT due to pain.    Time  4    Period  Weeks    Status  Partially Met    Target Date  05/11/20      PT LONG TERM GOAL #4   Title  Pt. will increase bilat UE flexion/abduction to 120 deg. for functional mobility for overhead reaching and bathing.    Baseline  R Flexion 79 with pain in deltoid, R Abduction 108; L flexion 131, L Abduction 109.  2/23: R sh. flexion >100 deg. (pain limited).  3/25:  R sh. flexion 134 deg./ abduction  114 deg.  L sh. flexion 130 deg./ abduction 128 deg. (consistent improvement).    Time  4    Period  Weeks    Status  Achieved    Target Date  02/27/20      PT LONG TERM GOAL #5   Title  Pt. able to ambulate outside on grassy terrain with consistent gait pattern and no assistive device safely.    Baseline  Pt. requires use of SPC for safety with gait.    Time  4    Period  Weeks    Status  New    Target Date  05/11/20            Plan - 05/02/20 1526    Clinical Impression Statement  Pt. has persistnet L shoulder pain with any overhead/ functional reaching tasks.  Pt. has difficulty relaxing B shoulder/ upper back while walking and working on dynamic balance tasks in PT clinic.  No LOB but extra time/ focus with Airex/ hurdle step ups/overs outside of //-bars.    Personal Factors and Comorbidities  Comorbidity 2    Comorbidities  Hypertension, Obesity    Examination-Activity Limitations  Bed Mobility;Reach Overhead;Squat;Lift    Examination-Participation Restrictions  Community Activity;Yard Work    Merchant navy officer  Evolving/Moderate complexity    Clinical Decision Making  Moderate    Rehab Potential  Good    PT Frequency  2x / week    PT Duration  4 weeks    PT Treatment/Interventions  ADLs/Self Care Home Management;Gait training;Stair training;Functional mobility training;Therapeutic activities;Therapeutic exercise;Balance training;Neuromuscular re-education;Manual techniques;Patient/family education    PT Next Visit Plan  B LE/UE strengthening and balance training.    Consulted and Agree with Plan of Care  Patient       Patient will benefit from skilled therapeutic intervention in order to improve the following deficits and impairments:  Abnormal gait, Decreased activity tolerance, Decreased balance, Decreased coordination, Decreased mobility, Decreased endurance, Decreased range of motion, Decreased strength, Difficulty walking, Dizziness, Impaired UE  functional use, Pain  Visit Diagnosis: Other abnormalities of gait and mobility  Unspecified lack of coordination  Muscle weakness (generalized)  Problem List Patient Active Problem List   Diagnosis Date Noted  . Hemiparesis affecting right side as late effect of cerebrovascular accident ([place]) 08/13/2019  . Other sleep apnea 08/06/2019  . History of kidney stones 05/02/2018  . ED (erectile dysfunction) 05/02/2018  . Hyperglycemia 01/30/2017  . Arthritis of knee, degenerative 01/30/2017  . BPH with obstruction/lower urinary tract symptoms 02/03/2016  . Gross hematuria 02/03/2016  . Dyslipidemia 08/04/2015  . Skin lesions 07/14/2015  . Diverticulitis of large intestine without perforation or abscess without bleeding 06/30/2015  . Edema 06/30/2015  . OSA on CPAP 06/30/2015  . Kidney pain 06/30/2015   Pura Spice, PT, DPT # [id number] 05/02/2020, 3:28 PM  [facility] [address] [place], [place], [address] Phone: [phone]   Fax:  [phone]  Name: Tom Underwood MRN: [id number] Date of Birth: Aug 13, 1951

## 2020-05-02 ENCOUNTER — Encounter: Payer: Self-pay | Admitting: Physical Therapy

## 2020-05-02 NOTE — Therapy (Signed)
[facility] [address] [place], [place], [address] Phone: [phone]   Fax:  [phone]  Physical Therapy Treatment  Patient Details  Name: Tom Underwood MRN: [id number] Date of Birth: 1951-07-10 Referring Provider (PT): Jennings Books, MD   Encounter Date: 05/01/2020  PT End of Session - 05/02/20 1535    Visit Number  32    Number of Visits  36    Date for PT Re-Evaluation  05/11/20    Authorization - Visit Number  5    Authorization - Number of Visits  10    PT Start Time  3845    PT Stop Time  1201    PT Time Calculation (min)  59 min    Activity Tolerance  Patient tolerated treatment well;Patient limited by pain    Behavior During Therapy  [facility] for tasks assessed/performed       Past Medical History:  Diagnosis Date  . Allergy   . Decreased libido   . Diverticulitis   . GERD (gastroesophageal reflux disease)   . HLD (hyperlipidemia)   . Hydronephrosis with renal and ureteral calculus obstruction   . Hydronephrosis with renal and ureteral calculus obstruction   . Hypertension   . Hypogonadism in male   . Rhinitis, allergic   . Sleep apnea   . Stroke ([place])   . Ureteral stone     Past Surgical History:  Procedure Laterality Date  . EXTERNAL EAR SURGERY    . FINGER SURGERY    . KNEE SURGERY    . left eye      There were no vitals filed for this visit.  Subjective Assessment - 05/02/20 1533    Subjective  Pt. continues to c/o B shoulder pain with overhead tasks and more acute pain in R ankle (pt. states he step wrong the other day).  No subjective pain score for ankle given.    Pertinent History  Pt. had a right sided stroke and loves to watch football and travel to the beach with his wife.    Limitations  Walking;Standing    How long can you walk comfortably?  0.2 miles before heaviness/fatigue    Patient Stated Goals  Increase walking distance, carry things with less pain in shoulder    Currently in  Pain?  Yes    Pain Location  Shoulder        There.ex.:  Nustep L5 10 min. (consistent cadence/ increase SOB)- no increase c/o sh. Pain. Seated/ standing Nautilus: 40# lat. Pull downs/ 30# tricep ext./ 40# scap. Retraction 20x (with cuing for posture/ balance) Standing hip ex. (reviewed HEP)  Neuro:  Standing step touches at stairs (no UE)- R ankle discomfort. Forward/ backward/ lateral walking in //-bars- cuing to increase hip flexion/ upright posture. Walking cone touches in //-bars (mirror feedback)- 1 mistake Walking outside on grass/ curb step ups    PT Long Term Goals - 04/13/20 2001      PT LONG TERM GOAL #1   Title  Pt. will be able to ambulate with least assistive device for 0.8 mile with good gait mechanics in home community to improve walking endurance.    Baseline  Pt. reports he can walk 0.2 mile in his mobile home community.  3/25: increase walking endurance but not to baseline.    Time  4    Period  Weeks    Status  Partially Met    Target Date  05/11/20  PT LONG TERM GOAL #2   Title  Pt. will improve FOTO score to > 57 to show improvements in LE function for ADLs    Baseline  41.  3/22: 44    Time  4    Period  Weeks    Status  On-going    Target Date  05/11/20      PT LONG TERM GOAL #3   Title  Pt. will increase R LE strength to grossly 5/5 to improve gait mechanics and LE strength for ADLs    Baseline  R hip flexion 4-/5, knee flexion 4+/5, ankle everison 4+/5.  3/25:  hip flexion 4/5 MMT.  5/10: 4/5 MMT.  Limited B shoulder MMT due to pain.    Time  4    Period  Weeks    Status  Partially Met    Target Date  05/11/20      PT LONG TERM GOAL #4   Title  Pt. will increase bilat UE flexion/abduction to 120 deg. for functional mobility for overhead reaching and bathing.    Baseline  R Flexion 79 with pain in deltoid, R Abduction 108; L flexion 131, L Abduction 109.  2/23: R sh. flexion >100 deg. (pain limited).  3/25:  R sh. flexion 134 deg./  abduction 114 deg.  L sh. flexion 130 deg./ abduction 128 deg. (consistent improvement).    Time  4    Period  Weeks    Status  Achieved    Target Date  02/27/20      PT LONG TERM GOAL #5   Title  Pt. able to ambulate outside on grassy terrain with consistent gait pattern and no assistive device safely.    Baseline  Pt. requires use of SPC for safety with gait.    Time  4    Period  Weeks    Status  New    Target Date  05/11/20            Plan - 05/02/20 1535    Clinical Impression Statement  Good technique in pain tolerable range with overhead resisted ther.ex. at [employer].  Increase R ankle discomfort with walking cone touches in //-bars with light UE assist required.  Increase SOB with exertional tasks/ Nustep in clinic but pt. able to recover quickly.  Pt. has difficulty with consistent arm swing/ positioning while walking and remains tense in B UT/ deltoids.    Personal Factors and Comorbidities  Comorbidity 2    Comorbidities  Hypertension, Obesity    Examination-Activity Limitations  Bed Mobility;Reach Overhead;Squat;Lift    Examination-Participation Restrictions  Community Activity;Yard Work    Merchant [employer] officer  Evolving/Moderate complexity    Clinical Decision Making  Moderate    Rehab Potential  Good    PT Frequency  2x / week    PT Duration  4 weeks    PT Treatment/Interventions  ADLs/Self Care Home Management;Gait training;Stair training;Functional mobility training;Therapeutic activities;Therapeutic exercise;Balance training;Neuromuscular re-education;Manual techniques;Patient/family education    PT Next Visit Plan  B LE/UE strengthening and balance training.   Reassess/ discuss POC and goals with pt.    Consulted and Agree with Plan of Care  Patient       Patient will benefit from skilled therapeutic intervention in order to improve the following deficits and impairments:  Abnormal gait, Decreased activity tolerance, Decreased balance, Decreased  coordination, Decreased mobility, Decreased endurance, Decreased range of motion, Decreased strength, Difficulty walking, Dizziness, Impaired UE functional use, Pain  Visit Diagnosis: Other abnormalities  of gait and mobility  Unspecified lack of coordination  Muscle weakness (generalized)     Problem List Patient Active Problem List   Diagnosis Date Noted  . Hemiparesis affecting right side as late effect of cerebrovascular accident ([place]) 08/13/2019  . Other sleep apnea 08/06/2019  . History of kidney stones 05/02/2018  . ED (erectile dysfunction) 05/02/2018  . Hyperglycemia 01/30/2017  . Arthritis of knee, degenerative 01/30/2017  . BPH with obstruction/lower urinary tract symptoms 02/03/2016  . Gross hematuria 02/03/2016  . Dyslipidemia 08/04/2015  . Skin lesions 07/14/2015  . Diverticulitis of large intestine without perforation or abscess without bleeding 06/30/2015  . Edema 06/30/2015  . OSA on CPAP 06/30/2015  . Kidney pain 06/30/2015   Pura Spice, PT, DPT # [id number] 05/02/2020, 3:40 PM  [facility] [address] [place], [place], [address] Phone: [phone]   Fax:  [phone]  Name: Tom Underwood MRN: [id number] Date of Birth: 17-Aug-1951

## 2020-05-04 ENCOUNTER — Other Ambulatory Visit: Payer: Self-pay | Admitting: Family Medicine

## 2020-05-05 ENCOUNTER — Other Ambulatory Visit: Payer: Self-pay

## 2020-05-06 ENCOUNTER — Other Ambulatory Visit: Payer: Self-pay

## 2020-05-06 ENCOUNTER — Encounter: Payer: Self-pay | Admitting: Physical Therapy

## 2020-05-06 ENCOUNTER — Ambulatory Visit: Payer: Medicare Other | Attending: Neurology | Admitting: Physical Therapy

## 2020-05-06 DIAGNOSIS — R279 Unspecified lack of coordination: Secondary | ICD-10-CM | POA: Diagnosis present

## 2020-05-06 DIAGNOSIS — M6281 Muscle weakness (generalized): Secondary | ICD-10-CM | POA: Diagnosis present

## 2020-05-06 DIAGNOSIS — R2689 Other abnormalities of gait and mobility: Secondary | ICD-10-CM | POA: Diagnosis not present

## 2020-05-06 NOTE — Therapy (Signed)
[facility] [address]. [place], [place], [address] Phone: [phone]   Fax:  [phone]  Physical Therapy Treatment  Patient Details  Name: Tom Underwood MRN: [id number] Date of Birth: 02-27-1951 Referring Provider (PT): Jennings Books, MD   Encounter Date: 05/06/2020  PT End of Session - 05/06/20 1844    Visit Number  33    Number of Visits  36    Date for PT Re-Evaluation  05/11/20    Authorization - Visit Number  6    Authorization - Number of Visits  10    PT Start Time  1093    PT Stop Time  1430    PT Time Calculation (min)  57 min    Activity Tolerance  Patient tolerated treatment well;Patient limited by pain    Behavior During Therapy  [facility] for tasks assessed/performed       Past Medical History:  Diagnosis Date  . Allergy   . Decreased libido   . Diverticulitis   . GERD (gastroesophageal reflux disease)   . HLD (hyperlipidemia)   . Hydronephrosis with renal and ureteral calculus obstruction   . Hydronephrosis with renal and ureteral calculus obstruction   . Hypertension   . Hypogonadism in male   . Rhinitis, allergic   . Sleep apnea   . Stroke ([place])   . Ureteral stone     Past Surgical History:  Procedure Laterality Date  . EXTERNAL EAR SURGERY    . FINGER SURGERY    . KNEE SURGERY    . left eye      There were no vitals filed for this visit.  Subjective Assessment - 05/06/20 1842    Subjective  Pt. reports no new complaints.  Pt. entered PT with use of SPC.    Pertinent History  Pt. had a right sided stroke and loves to watch football and travel to the beach with his wife.    Limitations  Walking;Standing    How long can you walk comfortably?  0.2 miles before heaviness/fatigue    Patient Stated Goals  Increase walking distance, carry things with less pain in shoulder    Currently in Pain?  No/denies        There.ex.:  Nustep L5 10 min. (consistent cadence/ increase SOB)- no increase c/o  sh. Pain. Nautilus: resisted gait (70#)- 3x all 4-planes.  No LOB with increase step pattern.  Neuro:  Walking over plinths/ step touches (heel/ toe) in //-bars (light to no UE) Forward/ backward/ lateral walking in //-bars- cuing to increase hip flexion/ upright posture. STS from blue mat table (varying heights)- no UE Tandem stance/ gait (forward/backward)- light UE assist required Walking outside on grass/ curb step downs.     PT Long Term Goals - 04/13/20 2001      PT LONG TERM GOAL #1   Title  Pt. will be able to ambulate with least assistive device for 0.8 mile with good gait mechanics in home community to improve walking endurance.    Baseline  Pt. reports he can walk 0.2 mile in his mobile home community.  3/25: increase walking endurance but not to baseline.    Time  4    Period  Weeks    Status  Partially Met    Target Date  05/11/20      PT LONG TERM GOAL #2   Title  Pt. will improve FOTO score to > 57 to show improvements in LE function for ADLs  Baseline  41.  3/22: 44    Time  4    Period  Weeks    Status  On-going    Target Date  05/11/20      PT LONG TERM GOAL #3   Title  Pt. will increase R LE strength to grossly 5/5 to improve gait mechanics and LE strength for ADLs    Baseline  R hip flexion 4-/5, knee flexion 4+/5, ankle everison 4+/5.  3/25:  hip flexion 4/5 MMT.  5/10: 4/5 MMT.  Limited B shoulder MMT due to pain.    Time  4    Period  Weeks    Status  Partially Met    Target Date  05/11/20      PT LONG TERM GOAL #4   Title  Pt. will increase bilat UE flexion/abduction to 120 deg. for functional mobility for overhead reaching and bathing.    Baseline  R Flexion 79 with pain in deltoid, R Abduction 108; L flexion 131, L Abduction 109.  2/23: R sh. flexion >100 deg. (pain limited).  3/25:  R sh. flexion 134 deg./ abduction 114 deg.  L sh. flexion 130 deg./ abduction 128 deg. (consistent improvement).    Time  4    Period  Weeks    Status  Achieved     Target Date  02/27/20      PT LONG TERM GOAL #5   Title  Pt. able to ambulate outside on grassy terrain with consistent gait pattern and no assistive device safely.    Baseline  Pt. requires use of SPC for safety with gait.    Time  4    Period  Weeks    Status  New    Target Date  05/11/20            Plan - 05/06/20 1844    Clinical Impression Statement  Pt. requires UE assist in //-bars with tandem stance/gait/ SLS balance activities to correct LOB.  Pt. works hard during resisted gait with increase step pattern/ heel strike.  No LOB with resisted gait at [place] with good R hip muscle control during L lateral walking.  PT will reassess pt. goals next tx. session/ discuss schedule.    Personal Factors and Comorbidities  Comorbidity 2    Comorbidities  Hypertension, Obesity    Examination-Activity Limitations  Bed Mobility;Reach Overhead;Squat;Lift    Examination-Participation Restrictions  Community Activity;Yard Work    [employer]  Evolving/Moderate complexity    Clinical Decision Making  Moderate    Rehab Potential  Good    PT Frequency  2x / week    PT Duration  4 weeks    PT Treatment/Interventions  ADLs/Self Care Home Management;Gait training;Stair training;Functional mobility training;Therapeutic activities;Therapeutic exercise;Balance training;Neuromuscular re-education;Manual techniques;Patient/family education    PT Next Visit Plan  B LE/UE strengthening and balance training.   Reassess/ discuss POC and goals with pt.    Consulted and Agree with Plan of Care  Patient       Patient will benefit from skilled therapeutic intervention in order to improve the following deficits and impairments:  Abnormal gait, Decreased activity tolerance, Decreased balance, Decreased coordination, Decreased mobility, Decreased endurance, Decreased range of motion, Decreased strength, Difficulty walking, Dizziness, Impaired UE functional use, Pain  Visit  Diagnosis: Other abnormalities of gait and mobility  Unspecified lack of coordination  Muscle weakness (generalized)     Problem List Patient Active Problem List   Diagnosis Date Noted  . Hemiparesis affecting  right side as late effect of cerebrovascular accident ([place]) 08/13/2019  . Other sleep apnea 08/06/2019  . History of kidney stones 05/02/2018  . ED (erectile dysfunction) 05/02/2018  . Hyperglycemia 01/30/2017  . Arthritis of knee, degenerative 01/30/2017  . BPH with obstruction/lower urinary tract symptoms 02/03/2016  . Gross hematuria 02/03/2016  . Dyslipidemia 08/04/2015  . Skin lesions 07/14/2015  . Diverticulitis of large intestine without perforation or abscess without bleeding 06/30/2015  . Edema 06/30/2015  . OSA on CPAP 06/30/2015  . Kidney pain 06/30/2015   Pura Spice, PT, DPT # [id number] 05/06/2020, 6:49 PM  [facility] [address] [place], [place], [address] Phone: [phone]   Fax:  [phone]  Name: DIRON HADDON MRN: [id number] Date of Birth: 1951/06/27

## 2020-05-07 ENCOUNTER — Other Ambulatory Visit: Payer: Self-pay

## 2020-05-07 MED ORDER — OMEGA-3-ACID ETHYL ESTERS 1 G PO CAPS: 2.0000 | ORAL_CAPSULE | Freq: Two times a day (BID) | ORAL | 0 refills | Status: DC

## 2020-05-08 ENCOUNTER — Other Ambulatory Visit: Payer: Self-pay

## 2020-05-08 ENCOUNTER — Ambulatory Visit: Payer: Medicare Other | Admitting: Physical Therapy

## 2020-05-08 DIAGNOSIS — M6281 Muscle weakness (generalized): Secondary | ICD-10-CM

## 2020-05-08 DIAGNOSIS — R2689 Other abnormalities of gait and mobility: Secondary | ICD-10-CM | POA: Diagnosis not present

## 2020-05-08 DIAGNOSIS — R279 Unspecified lack of coordination: Secondary | ICD-10-CM

## 2020-05-08 NOTE — Therapy (Addendum)
[facility] [address]. [place], [place], [address] Phone: [phone]   Fax:  [phone]  Physical Therapy Treatment  Patient Details  Name: Tom Underwood MRN: [id number] Date of Birth: [birth year] Referring Provider (PT): Jennings Books, MD   Encounter Date: 05/08/2020   PT End of Session - 05/08/20 1332    Visit Number  34    Number of Visits  36    Date for PT Re-Evaluation  05/11/20    Authorization - Visit Number  7    Authorization - Number of Visits  10    PT Start Time  1102    PT Stop Time  1201    PT Time Calculation (min)  59 min    Activity Tolerance  Patient tolerated treatment well;Patient limited by pain    Behavior During Therapy  [facility] for tasks assessed/performed        Past Medical History:  Diagnosis Date  . Allergy   . Decreased libido   . Diverticulitis   . GERD (gastroesophageal reflux disease)   . HLD (hyperlipidemia)   . Hydronephrosis with renal and ureteral calculus obstruction   . Hydronephrosis with renal and ureteral calculus obstruction   . Hypertension   . Hypogonadism in male   . Rhinitis, allergic   . Sleep apnea   . Stroke ([place])   . Ureteral stone     Past Surgical History:  Procedure Laterality Date  . EXTERNAL EAR SURGERY    . FINGER SURGERY    . KNEE SURGERY    . left eye      There were no vitals filed for this visit.    Pt. states R shoulder pain remains and ROM is limited at 90 deg. Pt. uses a pillow to support R UE in bed.     There.ex.:  Nustep L5 10 min. (consistent cadence/ increase SOB)- B UE/LE Nautilus: resisted gait (70#)- 3x all 4-planes.  No LOB with increase step pattern. Standing hip flexion/ walking partial lunges in //-bars with min. To no UE assist (mirror feedback for posture correction).  Reassessment of B shoulder AROM (pain)  Neuro:  Forward/ backward/ lateral walking in //-bars- cuing to increase hip flexion/ upright posture. Amb. In  //-bars with green hurdle step overs/ alt. UE and LE/ focus on decreasing L lateral lean with R hip flexion STS from blue mat table (varying heights)- no UE Tandem stance/ gait (forward/backward)- light UE assist required Walking outside on grass/ curb step downs.      PT Long Term Goals - 04/13/20 2001      PT LONG TERM GOAL #1   Title Pt. will be able to ambulate with least assistive device for 0.8 mile with good gait mechanics in home community to improve walking endurance.    Baseline Pt. reports he can walk 0.2 mile in his mobile home community.  3/25: increase walking endurance but not to baseline.    Time 4    Period Weeks    Status Partially Met    Target Date 05/11/20      PT LONG TERM GOAL #2   Title Pt. will improve FOTO score to > 57 to show improvements in LE function for ADLs    Baseline 41.  3/22: 44    Time 4    Period Weeks    Status On-going    Target Date 05/11/20      PT LONG TERM GOAL #3   Title Pt.  will increase R LE strength to grossly 5/5 to improve gait mechanics and LE strength for ADLs    Baseline R hip flexion 4-/5, knee flexion 4+/5, ankle everison 4+/5.  3/25:  hip flexion 4/5 MMT.  5/10: 4/5 MMT.  Limited B shoulder MMT due to pain.    Time 4    Period Weeks    Status Partially Met    Target Date 05/11/20      PT LONG TERM GOAL #4   Title Pt. will increase bilat UE flexion/abduction to 120 deg. for functional mobility for overhead reaching and bathing.    Baseline R Flexion 79 with pain in deltoid, R Abduction 108; L flexion 131, L Abduction 109.  2/23: R sh. flexion >100 deg. (pain limited).  3/25:  R sh. flexion 134 deg./ abduction 114 deg.  L sh. flexion 130 deg./ abduction 128 deg. (consistent improvement).    Time 4    Period Weeks    Status Achieved    Target Date 02/27/20      PT LONG TERM GOAL #5   Title Pt. able to ambulate outside on grassy terrain with consistent gait pattern and no assistive device safely.    Baseline Pt. requires  use of SPC for safety with gait.    Time 4    Period Weeks    Status New    Target Date 05/11/20            Pt. limited with B shoulder overhead reaching (R worse than L) due to pain/ worsening pain with PROM. Pt. works hard during resisted gait with increase step pattern/ heel strike. Increase SOB t/o tx. but pt. able to recover with short seated rest breaks. Good clearance with hip flexion over green hurdles with cuing to avoid circumduction. No LOB with head turns in hallway with cuing to increase arm swing/ posture/ consistent step pattern.      Patient will benefit from skilled therapeutic intervention in order to improve the following deficits and impairments:  Abnormal gait, Decreased activity tolerance, Decreased balance, Decreased coordination, Decreased mobility, Decreased endurance, Decreased range of motion, Decreased strength, Difficulty walking, Dizziness, Impaired UE functional use, Pain  Visit Diagnosis: Other abnormalities of gait and mobility  Unspecified lack of coordination  Muscle weakness (generalized)     Problem List Patient Active Problem List   Diagnosis Date Noted  . Hemiparesis affecting right side as late effect of cerebrovascular accident ([place]) 08/13/2019  . Other sleep apnea 08/06/2019  . History of kidney stones 05/02/2018  . ED (erectile dysfunction) 05/02/2018  . Hyperglycemia 01/30/2017  . Arthritis of knee, degenerative 01/30/2017  . BPH with obstruction/lower urinary tract symptoms 02/03/2016  . Gross hematuria 02/03/2016  . Dyslipidemia 08/04/2015  . Skin lesions 07/14/2015  . Diverticulitis of large intestine without perforation or abscess without bleeding 06/30/2015  . Edema 06/30/2015  . OSA on CPAP 06/30/2015  . Kidney pain 06/30/2015   Pura Spice, PT, DPT # [phone] 05/14/2020, 12:21 PM  [name] [address] [place], [place], [address] Phone: [phone]   Fax:   [phone]  Name: Tom Underwood MRN: [id number] Date of Birth: 07-Dec-1950

## 2020-05-11 ENCOUNTER — Encounter: Payer: Self-pay | Admitting: Family Medicine

## 2020-05-11 ENCOUNTER — Ambulatory Visit (INDEPENDENT_AMBULATORY_CARE_PROVIDER_SITE_OTHER): Payer: Medicare Other | Admitting: Family Medicine

## 2020-05-11 ENCOUNTER — Other Ambulatory Visit: Payer: Self-pay

## 2020-05-11 VITALS — BP 138/84 | HR 85 | Temp 97.9°F | Resp 22 | Ht 71.0 in | Wt 299.3 lb

## 2020-05-11 DIAGNOSIS — M75101 Unspecified rotator cuff tear or rupture of right shoulder, not specified as traumatic: Secondary | ICD-10-CM | POA: Diagnosis not present

## 2020-05-11 DIAGNOSIS — E785 Hyperlipidemia, unspecified: Secondary | ICD-10-CM

## 2020-05-11 DIAGNOSIS — I10 Essential (primary) hypertension: Secondary | ICD-10-CM

## 2020-05-11 DIAGNOSIS — R7303 Prediabetes: Secondary | ICD-10-CM | POA: Diagnosis not present

## 2020-05-11 DIAGNOSIS — M170 Bilateral primary osteoarthritis of knee: Secondary | ICD-10-CM | POA: Diagnosis not present

## 2020-05-11 DIAGNOSIS — I69351 Hemiplegia and hemiparesis following cerebral infarction affecting right dominant side: Secondary | ICD-10-CM

## 2020-05-11 DIAGNOSIS — R251 Tremor, unspecified: Secondary | ICD-10-CM | POA: Diagnosis not present

## 2020-05-11 DIAGNOSIS — M25511 Pain in right shoulder: Secondary | ICD-10-CM | POA: Diagnosis not present

## 2020-05-11 DIAGNOSIS — M25512 Pain in left shoulder: Secondary | ICD-10-CM

## 2020-05-11 DIAGNOSIS — G4733 Obstructive sleep apnea (adult) (pediatric): Secondary | ICD-10-CM | POA: Diagnosis not present

## 2020-05-11 DIAGNOSIS — M75102 Unspecified rotator cuff tear or rupture of left shoulder, not specified as traumatic: Secondary | ICD-10-CM

## 2020-05-11 DIAGNOSIS — Z125 Encounter for screening for malignant neoplasm of prostate: Secondary | ICD-10-CM

## 2020-05-11 DIAGNOSIS — K219 Gastro-esophageal reflux disease without esophagitis: Secondary | ICD-10-CM | POA: Diagnosis not present

## 2020-05-11 DIAGNOSIS — G4700 Insomnia, unspecified: Secondary | ICD-10-CM | POA: Diagnosis not present

## 2020-05-11 DIAGNOSIS — N400 Enlarged prostate without lower urinary tract symptoms: Secondary | ICD-10-CM

## 2020-05-11 MED ORDER — VALSARTAN 80 MG PO TABS: 80.0000 mg | ORAL_TABLET | Freq: Every day | ORAL | 1 refills | Status: DC

## 2020-05-11 MED ORDER — MELOXICAM 15 MG PO TABS: 15.0000 mg | ORAL_TABLET | Freq: Every day | ORAL | 0 refills | Status: DC | PRN

## 2020-05-11 MED ORDER — OMEGA-3-ACID ETHYL ESTERS 1 G PO CAPS: 2.0000 | ORAL_CAPSULE | Freq: Two times a day (BID) | ORAL | 5 refills | Status: DC

## 2020-05-11 MED ORDER — PANTOPRAZOLE SODIUM 20 MG PO TBEC: 20.0000 mg | DELAYED_RELEASE_TABLET | Freq: Every day | ORAL | 1 refills | Status: DC

## 2020-05-11 MED ORDER — AMITRIPTYLINE HCL 25 MG PO TABS: 25.0000 mg | ORAL_TABLET | Freq: Every evening | ORAL | 1 refills | Status: DC | PRN

## 2020-05-11 MED ORDER — ASPIRIN 325 MG PO TBEC: 325.0000 mg | DELAYED_RELEASE_TABLET | Freq: Every day | ORAL | 0 refills | Status: DC

## 2020-05-11 MED ORDER — ROSUVASTATIN CALCIUM 20 MG PO TABS: 20.0000 mg | ORAL_TABLET | Freq: Every day | ORAL | 1 refills | Status: DC

## 2020-05-11 NOTE — Progress Notes (Signed)
Name: Tom Underwood   MRN: [id number]    DOB: 11/23/1951   Date:05/11/2020       Progress Note  Subjective  Chief Complaint  Chief Complaint  Patient presents with  . Follow-up    Hemiparesis  . Hyperlipidemia  . Hypertension    HPI  History of CVA/07/23/2019 : he states he has been discourage because of slow progression to recovery, but he has been going to PT and states right leg is not feeling as heavy and has been more hopeful. The goal is to be able to walk 1 mile, currently walking 0.2 miles. He is still using a cane to assist with ambulation. He is also riding a bike to increase strength    Tremors: much better now, only present when excited or nervous. During exam I could see fasciculations on his arms   OSA: wearing CPAP every night for 4 or more hours per night on 100 % of nights . He states he is sleeping better, adjusting to the machine, wearing nose mask, could not tolerate full mask. Recently had a new strap because it was having some leakage around his nostril, doing better now.   HTN: heis now on Diovan 80 mg daily , no chest pain, palpitation or dizziness.BP at home has been around 130's/70's, bp at neurologist was 150's, yesterday at home in the 140's. Discussed going up to 160 mg of Diovan, but he states he will try taking daily at the same time and monitor for now  Hyperlipidemia: he is taking Crestor andcurrently on Lovaza, last LDL was 60. Since CVA he has been on higher dose of Crestor.   HWE:XHBZJ the care ofShannon McGowan for his urological care. Last PSA was done in 06/2019, he has follow up with Larene Beach in July   Morbid obesity / pre diabetes:he is not on medication,he has been working on portion control,  weight has been stable now. He was thinking about trying Nutrisystem . Discussed weight watchers also   Right rotator cuff: also has left shoulder pain: seeing Ortho, he has steroid injections on right side and he is not sure it helped,  still has decrease in rom. He will try to work it out with PT   Patient Active Problem List   Diagnosis Date Noted  . Hemiparesis affecting right side as late effect of cerebrovascular accident ([place]) 08/13/2019  . Other sleep apnea 08/06/2019  . History of kidney stones 05/02/2018  . ED (erectile dysfunction) 05/02/2018  . Hyperglycemia 01/30/2017  . Arthritis of knee, degenerative 01/30/2017  . BPH with obstruction/lower urinary tract symptoms 02/03/2016  . Gross hematuria 02/03/2016  . Dyslipidemia 08/04/2015  . Skin lesions 07/14/2015  . Diverticulitis of large intestine without perforation or abscess without bleeding 06/30/2015  . Edema 06/30/2015  . OSA on CPAP 06/30/2015  . Kidney pain 06/30/2015    Past Surgical History:  Procedure Laterality Date  . EXTERNAL EAR SURGERY    . FINGER SURGERY    . KNEE SURGERY    . left eye      Family History  Problem Relation Age of Onset  . Asthma Mother   . Heart disease Mother   . Heart attack Mother   . Dementia Mother   . Asthma Father   . Heart disease Father   . Stroke Father     Social History   Tobacco Use  . Smoking status: Former Smoker    Types: Pipe    Start date: 02/20/1973  Quit date: 02/20/1974    Years since quitting: 46.2  . Smokeless tobacco: Never Used  Substance Use Topics  . Alcohol use: No    Alcohol/week: 0.0 standard drinks     Current Outpatient Medications:  .  acetaminophen (TYLENOL) 650 MG CR tablet, Take 650 mg by mouth every 8 (eight) hours as needed for pain., Disp: , Rfl:  .  amitriptyline (ELAVIL) 25 MG tablet, Take 1 tablet (25 mg total) by mouth at bedtime as needed. for sleep, Disp: 90 tablet, Rfl: 1 .  aspirin EC 325 MG EC tablet, Take 1 tablet (325 mg total) by mouth daily., Disp: 100 tablet, Rfl: 0 .  Cetirizine HCl 10 MG CAPS, , Disp: , Rfl:  .  glucosamine-chondroitin 500-400 MG tablet, Take 1 tablet by mouth 3 (three) times daily., Disp: , Rfl:  .  meloxicam (MOBIC) 15 MG  tablet, Take 1 tablet (15 mg total) by mouth daily as needed., Disp: 90 tablet, Rfl: 0 .  Multiple Vitamin (MULTIVITAMIN) tablet, Take 1 tablet by mouth daily., Disp: , Rfl:  .  omega-3 acid ethyl esters (LOVAZA) 1 g capsule, Take 2 capsules (2 g total) by mouth 2 (two) times daily., Disp: 120 capsule, Rfl: 5 .  pantoprazole (PROTONIX) 20 MG tablet, Take 1 tablet (20 mg total) by mouth daily., Disp: 90 tablet, Rfl: 1 .  rosuvastatin (CRESTOR) 20 MG tablet, Take 1 tablet (20 mg total) by mouth daily., Disp: 90 tablet, Rfl: 1 .  Saccharomyces boulardii (PROBIOTIC) 250 MG CAPS, Take 1 capsule by mouth daily., Disp: 30 capsule, Rfl: 0 .  sildenafil (REVATIO) 20 MG tablet, Take 3 to 5 tablets two hours before intercouse on an empty stomach.  Do not take with nitrates., Disp: 50 tablet, Rfl: 3 .  valsartan (DIOVAN) 80 MG tablet, Take 1 tablet (80 mg total) by mouth daily., Disp: 90 tablet, Rfl: 1  Allergies  Allergen Reactions  . Penicillins Itching    I personally reviewed active problem list, medication list, allergies, family history, social history, health maintenance with the patient/caregiver today.   ROS  Constitutional: Negative for fever or weight change.  Respiratory: Negative for cough and shortness of breath.   Cardiovascular: Negative for chest pain or palpitations.  Gastrointestinal: Negative for abdominal pain, no bowel changes.  Musculoskeletal: positive  for gait problem but no joint swelling.  Skin: Negative for rash.  Neurological: Negative for dizziness or headache.  No other specific complaints in a complete review of systems (except as listed in HPI above).  Objective  Vitals:   05/11/20 0855  BP: 138/84  Pulse: 85  Resp: (!) 22  Temp: 97.9 F (36.6 C)  TempSrc: Temporal  SpO2: 92%  Weight: 299 lb 4.8 oz (135.8 kg)  Height: 5\' 11"  (1.803 m)   He states the mask makes him breath fast  Body mass index is 41.74 kg/m.  Physical Exam  Constitutional:  Patient appears well-developed and well-nourished. Obese  No distress.  HEENT: head atraumatic, normocephalic, pupils equal and reactive to light,  neck supple Cardiovascular: Normal rate, regular rhythm and normal heart sounds.  No murmur heard.2 plus  BLE edema. Pulmonary/Chest: Effort normal and breath sounds normal. No respiratory distress. Abdominal: Soft.  There is no tenderness. Muscular Skeletal: decrease rom of both shoulder  Neurological: using a cane to help with ambulation, fine hand tremors at rest, some fasciculations on arms. Decrease sensation on right side  Psychiatric: Patient has a normal mood and affect. behavior is normal. Judgment  and thought content normal.  PHQ2/9: Depression screen [facility] 2/9 05/11/2020 04/21/2020 03/26/2020 11/05/2019 09/18/2019  Decreased Interest 1 0 0 0 0  Down, Depressed, Hopeless 1 0 0 0 0  PHQ - 2 Score 2 0 0 0 0  Altered sleeping 0 - 0 0 0  Tired, decreased energy 0 - 0 0 0  Change in appetite 0 - 0 0 -  Feeling bad or failure about yourself  0 - 0 0 0  Trouble concentrating 0 - 0 0 0  Moving slowly or fidgety/restless 0 - 0 0 0  Suicidal thoughts 0 - 0 0 0  PHQ-9 Score 2 - 0 0 0  Difficult doing work/chores Not difficult at all - Not difficult at all - -  Some recent data might be hidden    phq 9 is positive   Fall Risk: Fall Risk  05/11/2020 04/21/2020 03/26/2020 11/05/2019 06/24/2019  Falls in the past year? 0 0 0 0 0  Number falls in past yr: - 0 0 0 -  Injury with Fall? - 0 0 0 -  Risk for fall due to : Impaired balance/gait Impaired balance/gait - - -  Follow up Falls prevention discussed Falls prevention discussed - - -     Functional Status Survey: Is the patient deaf or have difficulty hearing?: No Does the patient have difficulty seeing, even when wearing glasses/contacts?: Yes(Has an upcoming optometry appointment) Does the patient have difficulty concentrating, remembering, or making decisions?: No Does the patient have  difficulty walking or climbing stairs?: Yes Does the patient have difficulty dressing or bathing?: Yes Does the patient have difficulty doing errands alone such as visiting a doctor's office or shopping?: No    Assessment & Plan  1. Hemiparesis of right dominant side as late effect of cerebral infarction [facility])  Gradually improving , still getting PT   2. Hypertension, benign  - valsartan (DIOVAN) 80 MG tablet; Take 1 tablet (80 mg total) by mouth daily.  Dispense: 90 tablet; Refill: 1 - aspirin EC 325 MG EC tablet; Take 1 tablet (325 mg total) by mouth daily.  Dispense: 100 tablet; Refill: 0 - CBC with Differential/Platelet - Comprehensive metabolic panel  3. Dyslipidemia  - omega-3 acid ethyl esters (LOVAZA) 1 g capsule; Take 2 capsules (2 g total) by mouth 2 (two) times daily.  Dispense: 120 capsule; Refill: 5 - rosuvastatin (CRESTOR) 20 MG tablet; Take 1 tablet (20 mg total) by mouth daily.  Dispense: 90 tablet; Refill: 1 - Lipid panel  4. Morbid obesity (HCC)  Discussed with the patient the risk posed by an increased BMI. Discussed importance of portion control, calorie counting and at least 150 minutes of physical activity weekly. Avoid sweet beverages and drink more water. Eat at least 6 servings of fruit and vegetables daily   5. Occasional tremors   6. Primary osteoarthritis of both knees   7. OSA (obstructive sleep apnea)  Good compliance   8. Insomnia, unspecified type  - amitriptyline (ELAVIL) 25 MG tablet; Take 1 tablet (25 mg total) by mouth at bedtime as needed. for sleep  Dispense: 90 tablet; Refill: 1  9. Osteoarthritis of both knees, unspecified osteoarthritis type  Discussed meloxicam only prn , since needs to take aspirin   10. Bilateral shoulder pain, unspecified chronicity   11. Rotator cuff syndrome of both shoulders  - meloxicam (MOBIC) 15 MG tablet; Take 1 tablet (15 mg total) by mouth daily as needed.  Dispense: 90 tablet; Refill: 0  12.  Gastroesophageal reflux disease without esophagitis  - pantoprazole (PROTONIX) 20 MG tablet; Take 1 tablet (20 mg total) by mouth daily.  Dispense: 90 tablet; Refill: 1  13. Prediabetes  - Hemoglobin A1c  14. Prostate cancer screening  - PSA  15. Benign prostatic hyperplasia without lower urinary tract symptoms  - PSA

## 2020-05-12 ENCOUNTER — Ambulatory Visit: Payer: Medicare Other | Admitting: Physical Therapy

## 2020-05-12 DIAGNOSIS — R2689 Other abnormalities of gait and mobility: Secondary | ICD-10-CM | POA: Diagnosis not present

## 2020-05-12 DIAGNOSIS — M6281 Muscle weakness (generalized): Secondary | ICD-10-CM

## 2020-05-12 DIAGNOSIS — R279 Unspecified lack of coordination: Secondary | ICD-10-CM

## 2020-05-14 ENCOUNTER — Encounter: Payer: Self-pay | Admitting: Physical Therapy

## 2020-05-14 ENCOUNTER — Other Ambulatory Visit: Payer: Self-pay

## 2020-05-14 ENCOUNTER — Ambulatory Visit: Payer: Medicare Other | Admitting: Physical Therapy

## 2020-05-14 DIAGNOSIS — R2689 Other abnormalities of gait and mobility: Secondary | ICD-10-CM | POA: Diagnosis not present

## 2020-05-14 DIAGNOSIS — R279 Unspecified lack of coordination: Secondary | ICD-10-CM

## 2020-05-14 DIAGNOSIS — M6281 Muscle weakness (generalized): Secondary | ICD-10-CM

## 2020-05-14 NOTE — Therapy (Signed)
[facility] [address] [place], [place], [address] Phone: [phone]   Fax:  [phone]  Physical Therapy Treatment  Patient Details  Name: Tom Underwood MRN: [id number] Date of Birth: [birth year] Referring Provider (PT): Jennings Books, MD   Encounter Date: 05/14/2020   PT End of Session - 05/14/20 1305    Visit Number 36    Number of Visits 43    Date for PT Re-Evaluation 06/09/20    Authorization - Visit Number 2    Authorization - Number of Visits 10    PT Start Time 6761    PT Stop Time 1353    PT Time Calculation (min) 59 min    Equipment Utilized During Treatment Gait belt    Activity Tolerance Patient tolerated treatment well;Patient limited by pain    Behavior During Therapy [facility] for tasks assessed/performed           Past Medical History:  Diagnosis Date  . Allergy   . Decreased libido   . Diverticulitis   . GERD (gastroesophageal reflux disease)   . HLD (hyperlipidemia)   . Hydronephrosis with renal and ureteral calculus obstruction   . Hydronephrosis with renal and ureteral calculus obstruction   . Hypertension   . Hypogonadism in male   . Rhinitis, allergic   . Sleep apnea   . Stroke ([place])   . Ureteral stone     Past Surgical History:  Procedure Laterality Date  . EXTERNAL EAR SURGERY    . FINGER SURGERY    . KNEE SURGERY    . left eye      There were no vitals filed for this visit.   Subjective Assessment - 05/14/20 1301    Subjective Pt. reports R shoulder pain and stiffness today. Pt.'s R leg felt heavy yesterday, limiting how much he could walk. Pt. reports Nustep loosening up shoulders.    Pertinent History Pt. had a right sided stroke and loves to watch football and travel to the beach with his wife.    Limitations Walking;Standing    How long can you walk comfortably? 0.2 miles before heaviness/fatigue    Patient Stated Goals Increase walking distance, carry things with less pain in  shoulder    Currently in Pain? Yes    Pain Score 7     Pain Location Shoulder    Pain Orientation Right              Therex:   10 mins Nustep level 5, reviewed HEP Standing hip flexion 20x.    Neuromuscular Re-Ed:   All activities completed with CGA: Step ups onto airex in // bars: 12 reps Side stepping on airex balance beam in // bars: 3 reps STS and ball toss on airex: 20 sec bout, 2x 1.5 min bouts  Obstacle course: focus on turning, stepping over obstacles, and walking on unstable surfaces. Progressed to tighter turns around cones, and backwards walking over unstable surface and through cones. Walking outside on grass/ uneven terrain with CGA for cuing      PT Long Term Goals - 05/14/20 1238      PT LONG TERM GOAL #1   Title Pt. will be able to ambulate with least assistive device for 0.8 mile with good gait mechanics in home community to improve walking endurance.    Baseline Pt. reports he can walk 0.2 mile in his mobile home community.  3/25: increase walking endurance but not to baseline. 6/8: <0.5 miles  Time 4    Period Weeks    Status Partially Met    Target Date 06/09/20      PT LONG TERM GOAL #2   Title Pt. will improve FOTO score to > 57 to show improvements in LE function for ADLs    Baseline 41.  3/22: 44.   6/8: 45 (limited with heavy household tasks/ increase walking)    Time 4    Period Weeks    Status Not Met    Target Date 06/09/20      PT LONG TERM GOAL #3   Title Pt. will increase R LE strength to grossly 5/5 to improve gait mechanics and LE strength for ADLs    Baseline R hip flexion 4-/5, knee flexion 4+/5, ankle everison 4+/5.  3/25:  hip flexion 4/5 MMT.  5/10: 4/5 MMT.  Limited B shoulder MMT due to pain.   6/8: see clinical impression    Time 4    Period Weeks    Status Partially Met    Target Date 06/09/20      PT LONG TERM GOAL #4   Title Pt. will increase bilat UE flexion/abduction to 120 deg. for functional mobility for  overhead reaching and bathing.    Baseline R Flexion 79 with pain in deltoid, R Abduction 108; L flexion 131, L Abduction 109.  2/23: R sh. flexion >100 deg. (pain limited).  3/25:  R sh. flexion 134 deg./ abduction 114 deg.  L sh. flexion 130 deg./ abduction 128 deg. (consistent improvement).    Time 4    Period Weeks    Status Achieved      PT LONG TERM GOAL #5   Title Pt. able to ambulate outside on grassy terrain with consistent gait pattern and no assistive device safely.    Baseline Pt. requires use of SPC for safety with gait.    Time 4    Period Weeks    Status Partially Met    Target Date 06/09/20                 Plan - 05/14/20 1348    Clinical Impression Statement Pt. progressed to high level obstacle courses today including backwards walking. Pt. mentioned sidestepping over Airex balance beam was the biggest challenge today. Pt. limited with ball tosses due to shoulder pain in R than L. Pt. will follow-up with MD for shoulder pain.  Moderate cuing to correct upright posture/ prevent L lateral leaning during blue mat table sit to stands.    Personal Factors and Comorbidities Comorbidity 2    Comorbidities Hypertension, Obesity    Examination-Activity Limitations Bed Mobility;Reach Overhead;Squat;Lift    Examination-Participation Restrictions Community Activity;Yard Work    Merchant [employer] officer Evolving/Moderate complexity    Clinical Decision Making Moderate    Rehab Potential Good    PT Frequency 2x / week    PT Duration 4 weeks    PT Treatment/Interventions ADLs/Self Care Home Management;Gait training;Stair training;Functional mobility training;Therapeutic activities;Therapeutic exercise;Balance training;Neuromuscular re-education;Manual techniques;Patient/family education    PT Next Visit Plan B LE/UE strengthening and balance training.    Consulted and Agree with Plan of Care Patient           Patient will benefit from skilled therapeutic  intervention in order to improve the following deficits and impairments:  Abnormal gait, Decreased activity tolerance, Decreased balance, Decreased coordination, Decreased mobility, Decreased endurance, Decreased range of motion, Decreased strength, Difficulty walking, Dizziness, Impaired UE functional use, Pain  Visit Diagnosis: Other  abnormalities of gait and mobility  Unspecified lack of coordination  Muscle weakness (generalized)     Problem List Patient Active Problem List   Diagnosis Date Noted  . Hemiparesis affecting right side as late effect of cerebrovascular accident ([place]) 08/13/2019  . Other sleep apnea 08/06/2019  . History of kidney stones 05/02/2018  . ED (erectile dysfunction) 05/02/2018  . Hyperglycemia 01/30/2017  . Arthritis of knee, degenerative 01/30/2017  . BPH with obstruction/lower urinary tract symptoms 02/03/2016  . Gross hematuria 02/03/2016  . Dyslipidemia 08/04/2015  . Skin lesions 07/14/2015  . Diverticulitis of large intestine without perforation or abscess without bleeding 06/30/2015  . Edema 06/30/2015  . OSA on CPAP 06/30/2015  . Kidney pain 06/30/2015   Pura Spice, PT, DPT # [id number] 05/14/2020, 2:55 PM  [facility] [address] [place], [place], [address] Phone: [phone]   Fax:  [phone]  Name: DECARLO RIVET MRN: [id number] Date of Birth: [birth year]

## 2020-05-14 NOTE — Therapy (Signed)
[facility] [address] [place], [place], [address] Phone: [phone]   Fax:  [phone]  Physical Therapy Treatment  Patient Details  Name: Tom Underwood MRN: [id number] Date of Birth: 04-22-1951 Referring Provider (PT): Jennings Books, MD   Encounter Date: 05/12/2020   PT End of Session - 05/14/20 1232    Visit Number 35    Number of Visits 43    Date for PT Re-Evaluation 06/09/20    Authorization - Visit Number 1    Authorization - Number of Visits 10    PT Start Time 1110    PT Stop Time 1201    PT Time Calculation (min) 51 min    Activity Tolerance Patient tolerated treatment well;Patient limited by pain    Behavior During Therapy [facility] for tasks assessed/performed           Past Medical History:  Diagnosis Date  . Allergy   . Decreased libido   . Diverticulitis   . GERD (gastroesophageal reflux disease)   . HLD (hyperlipidemia)   . Hydronephrosis with renal and ureteral calculus obstruction   . Hydronephrosis with renal and ureteral calculus obstruction   . Hypertension   . Hypogonadism in male   . Rhinitis, allergic   . Sleep apnea   . Stroke ([place])   . Ureteral stone     Past Surgical History:  Procedure Laterality Date  . EXTERNAL EAR SURGERY    . FINGER SURGERY    . KNEE SURGERY    . left eye      There were no vitals filed for this visit.   Subjective Assessment - 05/14/20 1229    Subjective Pt. reports f/u with MD and order for continued PT services.  Pt. remains pain limited in B shoulders with overhead reaching.  No c/o sh. pain while using Nustep.    Pertinent History Pt. had a right sided stroke and loves to watch football and travel to the beach with his wife.    Limitations Walking;Standing    How long can you walk comfortably? 0.2 miles before heaviness/fatigue    Patient Stated Goals Increase walking distance, carry things with less pain in shoulder    Currently in Pain? Yes    Pain  Score 5     Pain Location Shoulder    Pain Orientation Right    Pain Descriptors / Indicators Aching              [facility] PT Assessment - 05/14/20 0001      Assessment   Medical Diagnosis Impaired Gait    Referring Provider (PT) Jennings Books, MD    Onset Date/Surgical Date 07/23/19    Hand Dominance Right      Home Environment   Living Environment Private residence      Prior Function   Level of Independence Independent              There.ex.:  Reassessment of B shoulder and B LE AROM/ MMT Nustep L5 10 min. (consistent cadence/ increase SOB)- B UE/LE Nautilus: resisted gait (70#)- 3x all 4-planes. No LOB with increase step pattern. Standing hip flexion/ walking partial lunges in //-bars with min. To no UE assist (mirror feedback for posture correction).   Neuro:  Forward/ backward/ lateral walking in //-bars- cuing to increase hip flexion/ upright posture. STS from blue mat table (varying heights)- no UE Tandem stance/ gait (forward/backward)- light UE assist required Walking outside on grass/ curb stepdowns.  PT Long Term Goals - 05/14/20 1238      PT LONG TERM GOAL #1   Title Pt. will be able to ambulate with least assistive device for 0.8 mile with good gait mechanics in home community to improve walking endurance.    Baseline Pt. reports he can walk 0.2 mile in his mobile home community.  3/25: increase walking endurance but not to baseline. 6/8: <0.5 miles    Time 4    Period Weeks    Status Partially Met    Target Date 06/09/20      PT LONG TERM GOAL #2   Title Pt. will improve FOTO score to > 57 to show improvements in LE function for ADLs    Baseline 41.  3/22: 44.   6/8: 45 (limited with heavy household tasks/ increase walking)    Time 4    Period Weeks    Status Not Met    Target Date 06/09/20      PT LONG TERM GOAL #3   Title Pt. will increase R LE strength to grossly 5/5 to improve gait mechanics and LE strength for ADLs     Baseline R hip flexion 4-/5, knee flexion 4+/5, ankle everison 4+/5.  3/25:  hip flexion 4/5 MMT.  5/10: 4/5 MMT.  Limited B shoulder MMT due to pain.   6/8: see clinical impression    Time 4    Period Weeks    Status Partially Met    Target Date 06/09/20      PT LONG TERM GOAL #4   Title Pt. will increase bilat UE flexion/abduction to 120 deg. for functional mobility for overhead reaching and bathing.    Baseline R Flexion 79 with pain in deltoid, R Abduction 108; L flexion 131, L Abduction 109.  2/23: R sh. flexion >100 deg. (pain limited).  3/25:  R sh. flexion 134 deg./ abduction 114 deg.  L sh. flexion 130 deg./ abduction 128 deg. (consistent improvement).    Time 4    Period Weeks    Status Achieved      PT LONG TERM GOAL #5   Title Pt. able to ambulate outside on grassy terrain with consistent gait pattern and no assistive device safely.    Baseline Pt. requires use of SPC for safety with gait.    Time 4    Period Weeks    Status Partially Met    Target Date 06/09/20                 Plan - 05/14/20 1233    Clinical Impression Statement L hip flexion/ knee flexion/ extension 5/5 MMT.  L hip IR 4/5 MMT.   R hip IR/ ER 4/5 MMT.  R shoulder flexion pain limited at 90 deg. flexion (5/10 pain reported).  R shoulder/ elbow strength grossly 4/5 MMT.  L shoulder/elbow strength grossly 5/5 MMT today (pain limited with overhead reaching).  Pt. has continued to benefit from use of [facility] with all aspects of walking.  No recent falls reported and pt. is more mobile with daily walking/ household tasks.    Personal Factors and Comorbidities Comorbidity 2    Comorbidities Hypertension, Obesity    Examination-Activity Limitations Bed Mobility;Reach Overhead;Squat;Lift    Examination-Participation Restrictions Community Activity;Yard Work    Stability/Clinical Decision Making Evolving/Moderate complexity    Clinical Decision Making Moderate    Rehab Potential Good    PT Frequency 2x / week     PT Duration 4 weeks  PT Treatment/Interventions ADLs/Self Care Home Management;Gait training;Stair training;Functional mobility training;Therapeutic activities;Therapeutic exercise;Balance training;Neuromuscular re-education;Manual techniques;Patient/family education    PT Next Visit Plan B LE/UE strengthening and balance training.    Consulted and Agree with Plan of Care Patient           Patient will benefit from skilled therapeutic intervention in order to improve the following deficits and impairments:  Abnormal gait, Decreased activity tolerance, Decreased balance, Decreased coordination, Decreased mobility, Decreased endurance, Decreased range of motion, Decreased strength, Difficulty walking, Dizziness, Impaired UE functional use, Pain  Visit Diagnosis: Other abnormalities of gait and mobility  Unspecified lack of coordination  Muscle weakness (generalized)     Problem List Patient Active Problem List   Diagnosis Date Noted  . Hemiparesis affecting right side as late effect of cerebrovascular accident ([place]) 08/13/2019  . Other sleep apnea 08/06/2019  . History of kidney stones 05/02/2018  . ED (erectile dysfunction) 05/02/2018  . Hyperglycemia 01/30/2017  . Arthritis of knee, degenerative 01/30/2017  . BPH with obstruction/lower urinary tract symptoms 02/03/2016  . Gross hematuria 02/03/2016  . Dyslipidemia 08/04/2015  . Skin lesions 07/14/2015  . Diverticulitis of large intestine without perforation or abscess without bleeding 06/30/2015  . Edema 06/30/2015  . OSA on CPAP 06/30/2015  . Kidney pain 06/30/2015   Pura Spice, PT, DPT # [id number] 05/14/2020, 12:42 PM  [facility] [address] [place], [place], [address] Phone: [phone]   Fax:  [phone]  Name: JASIAH ELSEN MRN: [id number] Date of Birth: August 02, 1951

## 2020-05-18 ENCOUNTER — Telehealth: Payer: Self-pay

## 2020-05-18 DIAGNOSIS — N400 Enlarged prostate without lower urinary tract symptoms: Secondary | ICD-10-CM | POA: Diagnosis not present

## 2020-05-18 DIAGNOSIS — Z125 Encounter for screening for malignant neoplasm of prostate: Secondary | ICD-10-CM | POA: Diagnosis not present

## 2020-05-18 DIAGNOSIS — E785 Hyperlipidemia, unspecified: Secondary | ICD-10-CM | POA: Diagnosis not present

## 2020-05-18 DIAGNOSIS — M25511 Pain in right shoulder: Secondary | ICD-10-CM | POA: Diagnosis not present

## 2020-05-18 DIAGNOSIS — R7303 Prediabetes: Secondary | ICD-10-CM | POA: Diagnosis not present

## 2020-05-18 DIAGNOSIS — M25512 Pain in left shoulder: Secondary | ICD-10-CM | POA: Diagnosis not present

## 2020-05-18 DIAGNOSIS — I10 Essential (primary) hypertension: Secondary | ICD-10-CM | POA: Diagnosis not present

## 2020-05-18 NOTE — Telephone Encounter (Signed)
Copied from CRM [id number]. Topic: General - Other >> May 18, 2020 10:37 AM Worthy Keeler D wrote: Reason for CRM: Patient needs to stop by the office to get a copy of the lab order.

## 2020-05-19 ENCOUNTER — Ambulatory Visit: Payer: Medicare Other | Admitting: Physical Therapy

## 2020-05-19 ENCOUNTER — Encounter: Payer: Self-pay | Admitting: Physical Therapy

## 2020-05-19 ENCOUNTER — Other Ambulatory Visit: Payer: Self-pay

## 2020-05-19 DIAGNOSIS — R2689 Other abnormalities of gait and mobility: Secondary | ICD-10-CM | POA: Diagnosis not present

## 2020-05-19 DIAGNOSIS — M6281 Muscle weakness (generalized): Secondary | ICD-10-CM

## 2020-05-19 DIAGNOSIS — R279 Unspecified lack of coordination: Secondary | ICD-10-CM

## 2020-05-19 LAB — CBC WITH DIFFERENTIAL/PLATELET
Basophils Absolute: 0 10*3/uL (ref 0.0–0.2)
Basos: 0 %
EOS (ABSOLUTE): 0.3 10*3/uL (ref 0.0–0.4)
Eos: 4 %
Hematocrit: 46.1 % (ref 37.5–51.0)
Hemoglobin: 16.1 g/dL (ref 13.0–17.7)
Immature Grans (Abs): 0 10*3/uL (ref 0.0–0.1)
Immature Granulocytes: 0 %
Lymphocytes Absolute: 2.4 10*3/uL (ref 0.7–3.1)
Lymphs: 39 %
MCH: 30.3 pg (ref 26.6–33.0)
MCHC: 34.9 g/dL (ref 31.5–35.7)
MCV: 87 fL (ref 79–97)
Monocytes Absolute: 0.5 10*3/uL (ref 0.1–0.9)
Monocytes: 8 %
Neutrophils Absolute: 3 10*3/uL (ref 1.4–7.0)
Neutrophils: 49 %
Platelets: 238 10*3/uL (ref 150–450)
RBC: 5.32 x10E6/uL (ref 4.14–5.80)
RDW: 13 % (ref 11.6–15.4)
WBC: 6.3 10*3/uL (ref 3.4–10.8)

## 2020-05-19 LAB — LIPID PANEL
Chol/HDL Ratio: 2.5 ratio (ref 0.0–5.0)
Cholesterol, Total: 135 mg/dL (ref 100–199)
HDL: 54 mg/dL (ref >39)
LDL Chol Calc (NIH): 70 mg/dL (ref 0–99)
Triglycerides: 49 mg/dL (ref 0–149)
VLDL Cholesterol Cal: 11 mg/dL (ref 5–40)

## 2020-05-19 LAB — COMPREHENSIVE METABOLIC PANEL
ALT: 23 IU/L (ref 0–44)
AST: 19 IU/L (ref 0–40)
Albumin/Globulin Ratio: 1.7 (ref 1.2–2.2)
Albumin: 4.7 g/dL (ref 3.8–4.8)
Alkaline Phosphatase: 122 IU/L — ABNORMAL HIGH (ref 48–121)
BUN/Creatinine Ratio: 18 (ref 10–24)
BUN: 16 mg/dL (ref 8–27)
Bilirubin Total: 0.6 mg/dL (ref 0.0–1.2)
CO2: 22 mmol/L (ref 20–29)
Calcium: 10.1 mg/dL (ref 8.6–10.2)
Chloride: 105 mmol/L (ref 96–106)
Creatinine, Ser: 0.89 mg/dL (ref 0.76–1.27)
GFR calc Af Amer: 102 mL/min/{1.73_m2} (ref >59)
GFR calc non Af Amer: 88 mL/min/{1.73_m2} (ref >59)
Globulin, Total: 2.8 g/dL (ref 1.5–4.5)
Glucose: 98 mg/dL (ref 65–99)
Potassium: 4.3 mmol/L (ref 3.5–5.2)
Sodium: 142 mmol/L (ref 134–144)
Total Protein: 7.5 g/dL (ref 6.0–8.5)

## 2020-05-19 LAB — HEMOGLOBIN A1C
Est. average glucose Bld gHb Est-mCnc: 120 mg/dL
Hgb A1c MFr Bld: 5.8 % — ABNORMAL HIGH (ref 4.8–5.6)

## 2020-05-19 LAB — PSA: Prostate Specific Ag, Serum: 0.3 ng/mL (ref 0.0–4.0)

## 2020-05-19 NOTE — Therapy (Signed)
[facility] [address] [place], [place], [address] Phone: [phone]   Fax:  [phone]  Physical Therapy Treatment  Patient Details  Name: Tom Underwood MRN: [id number] Date of Birth: [birth year] Referring Provider (PT): Jennings Books, MD   Encounter Date: 05/19/2020   PT End of Session - 05/19/20 1249    Visit Number 37    Number of Visits 43    Date for PT Re-Evaluation 06/09/20    Authorization - Visit Number 3    Authorization - Number of Visits 10    PT Start Time 1113    PT Stop Time 1200    PT Time Calculation (min) 47 min    Equipment Utilized During Treatment Gait belt    Activity Tolerance Patient tolerated treatment well;Patient limited by pain    Behavior During Therapy [facility] for tasks assessed/performed           Past Medical History:  Diagnosis Date  . Allergy   . Decreased libido   . Diverticulitis   . GERD (gastroesophageal reflux disease)   . HLD (hyperlipidemia)   . Hydronephrosis with renal and ureteral calculus obstruction   . Hydronephrosis with renal and ureteral calculus obstruction   . Hypertension   . Hypogonadism in male   . Rhinitis, allergic   . Sleep apnea   . Stroke ([place])   . Ureteral stone     Past Surgical History:  Procedure Laterality Date  . EXTERNAL EAR SURGERY    . FINGER SURGERY    . KNEE SURGERY    . left eye      There were no vitals filed for this visit.   Subjective Assessment - 05/19/20 1245    Subjective Pt reports improved sensation on R plantar surface of foot this previous weekend. Pt reports his RLE is starting to "wake up and not be so heavy".    Pertinent History Pt. had a right sided stroke and loves to watch football and travel to the beach with his wife.    Limitations Walking;Standing    How long can you walk comfortably? 0.2 miles before heaviness/fatigue    Patient Stated Goals Increase walking distance, carry things with less pain in shoulder     Currently in Pain? No/denies    Pain Score 0-No pain    Multiple Pain Sites No    Pain Score 0           There.ex:   Nu-Step: L5 for 10 min with use of UE/LE Reviewed HEP  Neuromuscular Re-Ed:  4 way resisted gait (forwards/backwards/R and L side-steps on Nautilus: 50 lbs, 1x4 each direction. Dual tasking performed during 4 way walking (naming favorite baseball players and naming [employer] franchises). Airex pad ball toss on trampoline: 2x10 with medium blue resistance ball. Required occasional min CGA for perturbations. Walking outside on uneven surfaces with focus on asc/desc curbs and ambulating over grass incorporating dual tasking. Head turns L and R and Up and down. No LOB noted.     PT Long Term Goals - 05/14/20 1238      PT LONG TERM GOAL #1   Title Pt. will be able to ambulate with least assistive device for 0.8 mile with good gait mechanics in home community to improve walking endurance.    Baseline Pt. reports he can walk 0.2 mile in his mobile home community.  3/25: increase walking endurance but not to baseline. 6/8: <0.5 miles    Time 4  Period Weeks    Status Partially Met    Target Date 06/09/20      PT LONG TERM GOAL #2   Title Pt. will improve FOTO score to > 57 to show improvements in LE function for ADLs    Baseline 41.  3/22: 44.   6/8: 45 (limited with heavy household tasks/ increase walking)    Time 4    Period Weeks    Status Not Met    Target Date 06/09/20      PT LONG TERM GOAL #3   Title Pt. will increase R LE strength to grossly 5/5 to improve gait mechanics and LE strength for ADLs    Baseline R hip flexion 4-/5, knee flexion 4+/5, ankle everison 4+/5.  3/25:  hip flexion 4/5 MMT.  5/10: 4/5 MMT.  Limited B shoulder MMT due to pain.   6/8: see clinical impression    Time 4    Period Weeks    Status Partially Met    Target Date 06/09/20      PT LONG TERM GOAL #4   Title Pt. will increase bilat UE flexion/abduction to 120 deg. for functional  mobility for overhead reaching and bathing.    Baseline R Flexion 79 with pain in deltoid, R Abduction 108; L flexion 131, L Abduction 109.  2/23: R sh. flexion >100 deg. (pain limited).  3/25:  R sh. flexion 134 deg./ abduction 114 deg.  L sh. flexion 130 deg./ abduction 128 deg. (consistent improvement).    Time 4    Period Weeks    Status Achieved      PT LONG TERM GOAL #5   Title Pt. able to ambulate outside on grassy terrain with consistent gait pattern and no assistive device safely.    Baseline Pt. requires use of SPC for safety with gait.    Time 4    Period Weeks    Status Partially Met    Target Date 06/09/20               Plan - 05/19/20 1250    Clinical Impression Statement Pt performed bouts of balance exercises with focus on perturbations and foam with incorporated dual tasking. Pt able to perform 4 way resisted walking at Nautilus with dual tasking with no LOB and no required seated rest breaks during. Pt displayed increased difficulty with standing on foam performing ball tosses on trampoline only performing 10 tosses before requiring seated 2 min rest break. Pt can continue to benefit from skilled PT treatment to improve dynamic balance, BLE strength, and standing/walking tolerance to reduce risk of falls and improve independence with ADL's.    Personal Factors and Comorbidities Comorbidity 2    Comorbidities Hypertension, Obesity    Examination-Activity Limitations Bed Mobility;Reach Overhead;Squat;Lift    Examination-Participation Restrictions Community Activity;Yard Work    Merchant [employer] officer Evolving/Moderate complexity    Rehab Potential Good    PT Frequency 2x / week    PT Duration 4 weeks    PT Treatment/Interventions ADLs/Self Care Home Management;Gait training;Stair training;Functional mobility training;Therapeutic activities;Therapeutic exercise;Balance training;Neuromuscular re-education;Manual techniques;Patient/family education    PT Next  Visit Plan B LE/UE strengthening and balance training.    Consulted and Agree with Plan of Care Patient           Patient will benefit from skilled therapeutic intervention in order to improve the following deficits and impairments:  Abnormal gait, Decreased activity tolerance, Decreased balance, Decreased coordination, Decreased mobility, Decreased endurance, Decreased range of  motion, Decreased strength, Difficulty walking, Dizziness, Impaired UE functional use, Pain  Visit Diagnosis: Other abnormalities of gait and mobility  Unspecified lack of coordination  Muscle weakness (generalized)     Problem List Patient Active Problem List   Diagnosis Date Noted  . Hemiparesis affecting right side as late effect of cerebrovascular accident ([place]) 08/13/2019  . Other sleep apnea 08/06/2019  . History of kidney stones 05/02/2018  . ED (erectile dysfunction) 05/02/2018  . Hyperglycemia 01/30/2017  . Arthritis of knee, degenerative 01/30/2017  . BPH with obstruction/lower urinary tract symptoms 02/03/2016  . Gross hematuria 02/03/2016  . Dyslipidemia 08/04/2015  . Skin lesions 07/14/2015  . Diverticulitis of large intestine without perforation or abscess without bleeding 06/30/2015  . Edema 06/30/2015  . OSA on CPAP 06/30/2015  . Kidney pain 06/30/2015   Pura Spice, PT, DPT # [id number] Rachael Fee, SPT 05/19/2020, 4:44 PM  [facility] [address] [place], [place], [address] Phone: [phone]   Fax:  [phone]  Name: Tom Underwood MRN: [id number] Date of Birth: Oct 07, 1951

## 2020-05-21 ENCOUNTER — Encounter: Payer: Self-pay | Admitting: Family Medicine

## 2020-05-21 ENCOUNTER — Encounter: Payer: Self-pay | Admitting: Physical Therapy

## 2020-05-21 ENCOUNTER — Ambulatory Visit: Payer: Medicare Other | Admitting: Physical Therapy

## 2020-05-21 ENCOUNTER — Other Ambulatory Visit: Payer: Self-pay

## 2020-05-21 DIAGNOSIS — R279 Unspecified lack of coordination: Secondary | ICD-10-CM

## 2020-05-21 DIAGNOSIS — R2689 Other abnormalities of gait and mobility: Secondary | ICD-10-CM | POA: Diagnosis not present

## 2020-05-21 DIAGNOSIS — M6281 Muscle weakness (generalized): Secondary | ICD-10-CM

## 2020-05-21 NOTE — Therapy (Signed)
[facility] [address] [place], [place], [address] Phone: [phone]   Fax:  [phone]  Physical Therapy Treatment  Patient Details  Name: Tom Underwood MRN: [id number] Date of Birth: 1951-08-25 Referring Provider (PT): Jennings Books, MD   Encounter Date: 05/21/2020   PT End of Session - 05/21/20 1224    Visit Number 38    Number of Visits 43    Date for PT Re-Evaluation 06/09/20    Authorization - Visit Number 4    Authorization - Number of Visits 10    PT Start Time 4174    PT Stop Time 1205    PT Time Calculation (min) 52 min    Equipment Utilized During Treatment Gait belt    Activity Tolerance Patient tolerated treatment well;Patient limited by pain    Behavior During Therapy [facility] for tasks assessed/performed           Past Medical History:  Diagnosis Date  . Allergy   . Decreased libido   . Diverticulitis   . GERD (gastroesophageal reflux disease)   . HLD (hyperlipidemia)   . Hydronephrosis with renal and ureteral calculus obstruction   . Hydronephrosis with renal and ureteral calculus obstruction   . Hypertension   . Hypogonadism in male   . Rhinitis, allergic   . Sleep apnea   . Stroke ([place])   . Ureteral stone     Past Surgical History:  Procedure Laterality Date  . EXTERNAL EAR SURGERY    . FINGER SURGERY    . KNEE SURGERY    . left eye      There were no vitals filed for this visit.   Subjective Assessment - 05/21/20 1223    Subjective Pt reports walking is becoming easier for him after R foot sensation has improved.    Pertinent History Pt. had a right sided stroke and loves to watch football and travel to the beach with his wife.    Limitations Walking;Standing    How long can you walk comfortably? 0.2 miles before heaviness/fatigue    Patient Stated Goals Increase walking distance, carry things with less pain in shoulder    Currently in Pain? No/denies    Pain Score 0-No pain     Multiple Pain Sites No          There.ex:   Nu-Step L5 for 10 min w/ UE/LE use. Goal of maintaining 50+ SPM STS with medium sized exercise ball: 2x12 Side Step-ups at stairs 6" step: 1x8, requiring CGA with mod verbal cues to square up shoulders and hips stepping up on LLE to better target L hip muscles. Pt required single UE use.  Neuro Re-ed:   Ambulated over blue mat with ankle weights underneath to mimic unstable surfaces w/ SPC use: x2  Cone weaving over mat: x5  Backwards over mat: x2  Side-steps on long airex pad at // bars: 2x5 with seated rest break in between sets. CGA from therapist and pt required occasional hand tap assist to regain balance.   Outside walking through grass and step downs over curbs x2 to car with focus on heel strike, reciprocal gait, and equal stride length.    PT Long Term Goals - 05/14/20 1238      PT LONG TERM GOAL #1   Title Pt. will be able to ambulate with least assistive device for 0.8 mile with good gait mechanics in home community to improve walking endurance.    Baseline Pt. reports  he can walk 0.2 mile in his mobile home community.  3/25: increase walking endurance but not to baseline. 6/8: <0.5 miles    Time 4    Period Weeks    Status Partially Met    Target Date 06/09/20      PT LONG TERM GOAL #2   Title Pt. will improve FOTO score to > 57 to show improvements in LE function for ADLs    Baseline 41.  3/22: 44.   6/8: 45 (limited with heavy household tasks/ increase walking)    Time 4    Period Weeks    Status Not Met    Target Date 06/09/20      PT LONG TERM GOAL #3   Title Pt. will increase R LE strength to grossly 5/5 to improve gait mechanics and LE strength for ADLs    Baseline R hip flexion 4-/5, knee flexion 4+/5, ankle everison 4+/5.  3/25:  hip flexion 4/5 MMT.  5/10: 4/5 MMT.  Limited B shoulder MMT due to pain.   6/8: see clinical impression    Time 4    Period Weeks    Status Partially Met    Target Date 06/09/20       PT LONG TERM GOAL #4   Title Pt. will increase bilat UE flexion/abduction to 120 deg. for functional mobility for overhead reaching and bathing.    Baseline R Flexion 79 with pain in deltoid, R Abduction 108; L flexion 131, L Abduction 109.  2/23: R sh. flexion >100 deg. (pain limited).  3/25:  R sh. flexion 134 deg./ abduction 114 deg.  L sh. flexion 130 deg./ abduction 128 deg. (consistent improvement).    Time 4    Period Weeks    Status Achieved      PT LONG TERM GOAL #5   Title Pt. able to ambulate outside on grassy terrain with consistent gait pattern and no assistive device safely.    Baseline Pt. requires use of SPC for safety with gait.    Time 4    Period Weeks    Status Partially Met    Target Date 06/09/20                 Plan - 05/21/20 1225    Clinical Impression Statement Today's session with focus on dynamic balance on foam with LE strength, Pt demonstrated ability to weave through cones on uneven surface requiring supervision and minimal CGA. Pt demonstrated ability to side step on airex pad with focus on hip/ankle stability with balance component of working on ankle srategy for balance. Pt required mild Hand touch assist for support with noticeable shaking at ankles demonstartin difficulty of task for pt. With side step ups on 6" step pt required constant CGA for R and L side and use of single UE for stepping up on L LE.    Personal Factors and Comorbidities Comorbidity 2    Comorbidities Hypertension, Obesity    Examination-Activity Limitations Bed Mobility;Reach Overhead;Squat;Lift    Examination-Participation Restrictions Community Activity;Yard Work    Merchant [employer] officer Evolving/Moderate complexity    Clinical Decision Making Moderate    Rehab Potential Good    PT Frequency 2x / week    PT Duration 4 weeks    PT Treatment/Interventions ADLs/Self Care Home Management;Gait training;Stair training;Functional mobility training;Therapeutic  activities;Therapeutic exercise;Balance training;Neuromuscular re-education;Manual techniques;Patient/family education    PT Next Visit Plan B LE/UE strengthening and balance training.    Consulted and Agree with Plan of  Care Patient           Patient will benefit from skilled therapeutic intervention in order to improve the following deficits and impairments:  Abnormal gait, Decreased activity tolerance, Decreased balance, Decreased coordination, Decreased mobility, Decreased endurance, Decreased range of motion, Decreased strength, Difficulty walking, Dizziness, Impaired UE functional use, Pain  Visit Diagnosis: Other abnormalities of gait and mobility  Unspecified lack of coordination  Muscle weakness (generalized)     Problem List Patient Active Problem List   Diagnosis Date Noted  . Hemiparesis affecting right side as late effect of cerebrovascular accident ([place]) 08/13/2019  . Other sleep apnea 08/06/2019  . History of kidney stones 05/02/2018  . ED (erectile dysfunction) 05/02/2018  . Hyperglycemia 01/30/2017  . Arthritis of knee, degenerative 01/30/2017  . BPH with obstruction/lower urinary tract symptoms 02/03/2016  . Gross hematuria 02/03/2016  . Dyslipidemia 08/04/2015  . Skin lesions 07/14/2015  . Diverticulitis of large intestine without perforation or abscess without bleeding 06/30/2015  . Edema 06/30/2015  . OSA on CPAP 06/30/2015  . Kidney pain 06/30/2015   Pura Spice, PT, DPT # [address] SPT 05/21/2020, 3:59 PM  [place] [facility] [address], [place], [address] Phone: [phone]   Fax:  [phone]  Name: CHRISTINE SCHIEFELBEIN MRN: [id number] Date of Birth: [birth year]

## 2020-05-26 ENCOUNTER — Encounter: Payer: Self-pay | Admitting: Physical Therapy

## 2020-05-26 ENCOUNTER — Other Ambulatory Visit: Payer: Self-pay

## 2020-05-26 ENCOUNTER — Ambulatory Visit: Payer: Medicare Other | Admitting: Physical Therapy

## 2020-05-26 DIAGNOSIS — R2689 Other abnormalities of gait and mobility: Secondary | ICD-10-CM | POA: Diagnosis not present

## 2020-05-26 DIAGNOSIS — R279 Unspecified lack of coordination: Secondary | ICD-10-CM

## 2020-05-26 DIAGNOSIS — M6281 Muscle weakness (generalized): Secondary | ICD-10-CM

## 2020-05-26 NOTE — Therapy (Signed)
[facility] [address] [place], [place], [address] Phone: [phone]   Fax:  [phone]  Physical Therapy Treatment  Patient Details  Name: Tom Underwood MRN: [id number] Date of Birth: [birth year] Referring Provider (PT): Jennings Books, MD   Encounter Date: 05/26/2020   PT End of Session - 05/26/20 1215    Visit Number 39    Number of Visits 43    Date for PT Re-Evaluation 06/09/20    Authorization - Visit Number 5    Authorization - Number of Visits 10    PT Start Time 2336    PT Stop Time 1200    PT Time Calculation (min) 55 min    Equipment Utilized During Treatment Gait belt    Activity Tolerance Patient tolerated treatment well;Patient limited by pain    Behavior During Therapy [facility] for tasks assessed/performed           Past Medical History:  Diagnosis Date  . Allergy   . Decreased libido   . Diverticulitis   . GERD (gastroesophageal reflux disease)   . HLD (hyperlipidemia)   . Hydronephrosis with renal and ureteral calculus obstruction   . Hydronephrosis with renal and ureteral calculus obstruction   . Hypertension   . Hypogonadism in male   . Rhinitis, allergic   . Sleep apnea   . Stroke ([place])   . Ureteral stone     Past Surgical History:  Procedure Laterality Date  . EXTERNAL EAR SURGERY    . FINGER SURGERY    . KNEE SURGERY    . left eye      There were no vitals filed for this visit.   Subjective Assessment - 05/26/20 1213    Subjective Pt reports he is beginning to safely ambulate aorund his home witout cane. Pt states he is feeling more conifdent in his balance and walking abilities. Pt states his R hip hurts at 4/10 NPS from sleeping.    Pertinent History Pt. had a right sided stroke and loves to watch football and travel to the beach with his wife.    Limitations Walking;Standing    How long can you walk comfortably? 0.2 miles before heaviness/fatigue    Patient Stated Goals Increase  walking distance, carry things with less pain in shoulder    Pain Score 4     Pain Location Hip    Pain Orientation Right    Pain Descriptors / Indicators Aching;Discomfort    Multiple Pain Sites No          There.ex:   Nu-Step L5, use of LE's/UE's for 10 min    Neuro Re-Ed:   Obstacle course x3 with CGA: Navigating unstable surface, 6" step toe taps (x3/foot), hurdles x3 forwards, and increases in gait speed Trampoline ball toss medium blue medicine ball in B tandem stance: 2x12/foot  STS holding medium blue med ball: 2x12 Side steps w/ floor ladder x4  Forward/Diaganol R/ Diaganol L weight shifts (2x8)    PT Long Term Goals - 05/14/20 1238      PT LONG TERM GOAL #1   Title Pt. will be able to ambulate with least assistive device for 0.8 mile with good gait mechanics in home community to improve walking endurance.    Baseline Pt. reports he can walk 0.2 mile in his mobile home community.  3/25: increase walking endurance but not to baseline. 6/8: <0.5 miles    Time 4    Period Weeks  Status Partially Met    Target Date 06/09/20      PT LONG TERM GOAL #2   Title Pt. will improve FOTO score to > 57 to show improvements in LE function for ADLs    Baseline 41.  3/22: 44.   6/8: 45 (limited with heavy household tasks/ increase walking)    Time 4    Period Weeks    Status Not Met    Target Date 06/09/20      PT LONG TERM GOAL #3   Title Pt. will increase R LE strength to grossly 5/5 to improve gait mechanics and LE strength for ADLs    Baseline R hip flexion 4-/5, knee flexion 4+/5, ankle everison 4+/5.  3/25:  hip flexion 4/5 MMT.  5/10: 4/5 MMT.  Limited B shoulder MMT due to pain.   6/8: see clinical impression    Time 4    Period Weeks    Status Partially Met    Target Date 06/09/20      PT LONG TERM GOAL #4   Title Pt. will increase bilat UE flexion/abduction to 120 deg. for functional mobility for overhead reaching and bathing.    Baseline R Flexion 79 with  pain in deltoid, R Abduction 108; L flexion 131, L Abduction 109.  2/23: R sh. flexion >100 deg. (pain limited).  3/25:  R sh. flexion 134 deg./ abduction 114 deg.  L sh. flexion 130 deg./ abduction 128 deg. (consistent improvement).    Time 4    Period Weeks    Status Achieved      PT LONG TERM GOAL #5   Title Pt. able to ambulate outside on grassy terrain with consistent gait pattern and no assistive device safely.    Baseline Pt. requires use of SPC for safety with gait.    Time 4    Period Weeks    Status Partially Met    Target Date 06/09/20                 Plan - 05/26/20 1216    Clinical Impression Statement Pt continues to display improvements in dynamic balance. Pt displays increased difficulties with diagonal and lateral side-steps having bouts of LOB and difficulty increasing gait speed. Pt continues to require frequent seated rest breaks after exercise. Pt will continue to benefit from skiled PT treatment to reduce risk of falls and improve BLE strength and gait to assist in endurance to complete standing and walking ADL's.    Personal Factors and Comorbidities Comorbidity 2    Comorbidities Hypertension, Obesity    Examination-Activity Limitations Bed Mobility;Reach Overhead;Squat;Lift    Examination-Participation Restrictions Community Activity;Yard Work    Merchant [employer] officer Evolving/Moderate complexity    Clinical Decision Making Moderate    Rehab Potential Good    PT Frequency 2x / week    PT Duration 4 weeks    PT Treatment/Interventions ADLs/Self Care Home Management;Gait training;Stair training;Functional mobility training;Therapeutic activities;Therapeutic exercise;Balance training;Neuromuscular re-education;Manual techniques;Patient/family education    PT Next Visit Plan B LE/UE strengthening and balance training.    Consulted and Agree with Plan of Care Patient           Patient will benefit from skilled therapeutic intervention in order  to improve the following deficits and impairments:  Abnormal gait, Decreased activity tolerance, Decreased balance, Decreased coordination, Decreased mobility, Decreased endurance, Decreased range of motion, Decreased strength, Difficulty walking, Dizziness, Impaired UE functional use, Pain  Visit Diagnosis: Other abnormalities of gait and mobility  Unspecified lack of coordination  Muscle weakness (generalized)     Problem List Patient Active Problem List   Diagnosis Date Noted  . Hemiparesis affecting right side as late effect of cerebrovascular accident ([place]) 08/13/2019  . Other sleep apnea 08/06/2019  . History of kidney stones 05/02/2018  . ED (erectile dysfunction) 05/02/2018  . Hyperglycemia 01/30/2017  . Arthritis of knee, degenerative 01/30/2017  . BPH with obstruction/lower urinary tract symptoms 02/03/2016  . Gross hematuria 02/03/2016  . Dyslipidemia 08/04/2015  . Skin lesions 07/14/2015  . Diverticulitis of large intestine without perforation or abscess without bleeding 06/30/2015  . Edema 06/30/2015  . OSA on CPAP 06/30/2015  . Kidney pain 06/30/2015   Pura Spice, PT, DPT # [address] SPT 05/27/2020, 8:42 AM  [facility] [address] [place], [place], [address] Phone: [phone]   Fax:  [phone]  Name: Tom Underwood MRN: [id number] Date of Birth: 06/15/1951

## 2020-05-28 ENCOUNTER — Encounter: Payer: Self-pay | Admitting: Physical Therapy

## 2020-05-28 ENCOUNTER — Ambulatory Visit: Payer: Medicare Other | Admitting: Physical Therapy

## 2020-05-28 ENCOUNTER — Other Ambulatory Visit: Payer: Self-pay

## 2020-05-28 DIAGNOSIS — M6281 Muscle weakness (generalized): Secondary | ICD-10-CM

## 2020-05-28 DIAGNOSIS — R279 Unspecified lack of coordination: Secondary | ICD-10-CM

## 2020-05-28 DIAGNOSIS — R2689 Other abnormalities of gait and mobility: Secondary | ICD-10-CM | POA: Diagnosis not present

## 2020-05-28 NOTE — Therapy (Signed)
[facility] [address] [place], [place], [address] Phone: [phone]   Fax:  [phone]  Physical Therapy Treatment  Patient Details  Name: Tom Underwood MRN: [id number] Date of Birth: [birth year] Referring Provider (PT): Jennings Books, MD   Encounter Date: 05/28/2020   PT End of Session - 05/28/20 1118    Visit Number 40    Number of Visits 43    Date for PT Re-Evaluation 06/09/20    Authorization - Visit Number 6    Authorization - Number of Visits 10    PT Start Time 1112    PT Stop Time 1205    PT Time Calculation (min) 53 min    Equipment Utilized During Treatment Gait belt    Activity Tolerance Patient tolerated treatment well;Patient limited by pain    Behavior During Therapy [facility] for tasks assessed/performed           Past Medical History:  Diagnosis Date  . Allergy   . Decreased libido   . Diverticulitis   . GERD (gastroesophageal reflux disease)   . HLD (hyperlipidemia)   . Hydronephrosis with renal and ureteral calculus obstruction   . Hydronephrosis with renal and ureteral calculus obstruction   . Hypertension   . Hypogonadism in male   . Rhinitis, allergic   . Sleep apnea   . Stroke ([place])   . Ureteral stone     Past Surgical History:  Procedure Laterality Date  . EXTERNAL EAR SURGERY    . FINGER SURGERY    . KNEE SURGERY    . left eye      There were no vitals filed for this visit.   Subjective Assessment - 05/28/20 1113    Subjective Pt reports ankle soreness from previous session working on ankle strategy and balance. Reports soreness and ache pain 2/10 NPS.    Pertinent History Pt. had a right sided stroke and loves to watch football and travel to the beach with his wife.    Limitations Walking;Standing    How long can you walk comfortably? 0.2 miles before heaviness/fatigue    Patient Stated Goals Increase walking distance, carry things with less pain in shoulder    Currently in Pain?  Yes    Pain Score 2     Pain Location Ankle    Pain Orientation Right    Pain Descriptors / Indicators Aching    Pain Type Acute pain    Pain Onset Yesterday    Multiple Pain Sites No          There.ex:  No charge  Nu-Step: L5 for 10 min w/ UE/LE use with a goal maintaining watts above 50 watts 1x5 STS with 8 lbs blue med ball with increase in ankle pain. 1x5 STS no added resistance with improvement in ankle symptoms.   Manual therapy: Pt in supine and bolster under knees   Baseline NPS: 2/10 NPS STM to posterior tib and peroneus longus and brevis with muscle release technique as patient plantarflexed and dorsiflexed.  2x30 sec hold stretch to gastroc/soleus complex  Improvement in soreness to 1/10 NPS.   Neuro Re-Ed:  Ambulating in hallway without use of SPC: CGA from therapist with all gait  Normalizing gait with equal gait step length and focus on heel strike x1  Dual task gait: Head turns R/L reading off letters on wall x2, head turns up/down x2 Outside ambulation over grass and uneven sidewalks with verbal cues for improved arm swing with  reciprocal gait.    PT Long Term Goals - 05/14/20 1238      PT LONG TERM GOAL #1   Title Pt. will be able to ambulate with least assistive device for 0.8 mile with good gait mechanics in home community to improve walking endurance.    Baseline Pt. reports he can walk 0.2 mile in his mobile home community.  3/25: increase walking endurance but not to baseline. 6/8: <0.5 miles    Time 4    Period Weeks    Status Partially Met    Target Date 06/09/20      PT LONG TERM GOAL #2   Title Pt. will improve FOTO score to > 57 to show improvements in LE function for ADLs    Baseline 41.  3/22: 44.   6/8: 45 (limited with heavy household tasks/ increase walking)    Time 4    Period Weeks    Status Not Met    Target Date 06/09/20      PT LONG TERM GOAL #3   Title Pt. will increase R LE strength to grossly 5/5 to improve gait mechanics and  LE strength for ADLs    Baseline R hip flexion 4-/5, knee flexion 4+/5, ankle everison 4+/5.  3/25:  hip flexion 4/5 MMT.  5/10: 4/5 MMT.  Limited B shoulder MMT due to pain.   6/8: see clinical impression    Time 4    Period Weeks    Status Partially Met    Target Date 06/09/20      PT LONG TERM GOAL #4   Title Pt. will increase bilat UE flexion/abduction to 120 deg. for functional mobility for overhead reaching and bathing.    Baseline R Flexion 79 with pain in deltoid, R Abduction 108; L flexion 131, L Abduction 109.  2/23: R sh. flexion >100 deg. (pain limited).  3/25:  R sh. flexion 134 deg./ abduction 114 deg.  L sh. flexion 130 deg./ abduction 128 deg. (consistent improvement).    Time 4    Period Weeks    Status Achieved      PT LONG TERM GOAL #5   Title Pt. able to ambulate outside on grassy terrain with consistent gait pattern and no assistive device safely.    Baseline Pt. requires use of SPC for safety with gait.    Time 4    Period Weeks    Status Partially Met    Target Date 06/09/20                 Plan - 05/28/20 1212    Clinical Impression Statement Pt limited by R ankle and lower leg soreness and pain rated at 2/10 NPS. Focus of today's treatment was reducing soreness. After manual therapy pt's pain was 1/10 NPS. No increases in pain with dual tasking of ambulation with no SPC. Pt required CGA for head turns while ambulating demonstrating decreases in gait speed and unequal step lengths and intermittent lateral sway. Increased pain noted with 8 lbs med bal STS's. Reduction of symptoms with STS's with no added resistance. Pt required verbal cues ofr equal arm swing with outdoor ambulation over unever terrain. Decreases in gait speed but no LOB.    Personal Factors and Comorbidities Comorbidity 2    Comorbidities Hypertension, Obesity    Examination-Activity Limitations Bed Mobility;Reach Overhead;Squat;Lift    Examination-Participation Restrictions Community  Activity;Yard Work    Merchant [employer] officer Evolving/Moderate complexity    Rehab Potential Good  PT Frequency 2x / week    PT Duration 4 weeks    PT Treatment/Interventions ADLs/Self Care Home Management;Gait training;Stair training;Functional mobility training;Therapeutic activities;Therapeutic exercise;Balance training;Neuromuscular re-education;Manual techniques;Patient/family education    PT Next Visit Plan B LE/UE strengthening and balance training.    Consulted and Agree with Plan of Care Patient           Patient will benefit from skilled therapeutic intervention in order to improve the following deficits and impairments:  Abnormal gait, Decreased activity tolerance, Decreased balance, Decreased coordination, Decreased mobility, Decreased endurance, Decreased range of motion, Decreased strength, Difficulty walking, Dizziness, Impaired UE functional use, Pain  Visit Diagnosis: Unspecified lack of coordination  Muscle weakness (generalized)     Problem List Patient Active Problem List   Diagnosis Date Noted  . Hemiparesis affecting right side as late effect of cerebrovascular accident ([place]) 08/13/2019  . Other sleep apnea 08/06/2019  . History of kidney stones 05/02/2018  . ED (erectile dysfunction) 05/02/2018  . Hyperglycemia 01/30/2017  . Arthritis of knee, degenerative 01/30/2017  . BPH with obstruction/lower urinary tract symptoms 02/03/2016  . Gross hematuria 02/03/2016  . Dyslipidemia 08/04/2015  . Skin lesions 07/14/2015  . Diverticulitis of large intestine without perforation or abscess without bleeding 06/30/2015  . Edema 06/30/2015  . OSA on CPAP 06/30/2015  . Kidney pain 06/30/2015   Pura Spice, PT, DPT # [address], SPT 05/28/2020, 2:06 PM  [place] [facility] [address] [place], [place], [address] Phone: [phone]   Fax:  [phone]  Name: DEQUAN KINDRED MRN:  [id number] Date of Birth: June 21, 1951

## 2020-06-02 ENCOUNTER — Other Ambulatory Visit: Payer: Self-pay

## 2020-06-02 ENCOUNTER — Ambulatory Visit: Payer: Medicare Other | Admitting: Physical Therapy

## 2020-06-02 ENCOUNTER — Encounter: Payer: Self-pay | Admitting: Physical Therapy

## 2020-06-02 DIAGNOSIS — M6281 Muscle weakness (generalized): Secondary | ICD-10-CM

## 2020-06-02 DIAGNOSIS — R2689 Other abnormalities of gait and mobility: Secondary | ICD-10-CM | POA: Diagnosis not present

## 2020-06-02 DIAGNOSIS — R279 Unspecified lack of coordination: Secondary | ICD-10-CM

## 2020-06-02 NOTE — Therapy (Signed)
[facility] [address] [place], [place], [address] Phone: [phone]   Fax:  [phone]  Physical Therapy Treatment  Patient Details  Name: Tom Underwood MRN: [id number] Date of Birth: [birth year] Referring Provider (PT): Jennings Books, MD   Encounter Date: 06/02/2020   PT End of Session - 06/02/20 1136    Visit Number 41    Number of Visits 43    Date for PT Re-Evaluation 06/09/20    Authorization - Visit Number 7    Authorization - Number of Visits 10    PT Start Time 0948    PT Stop Time 1030    PT Time Calculation (min) 42 min    Equipment Utilized During Treatment Gait belt    Activity Tolerance Patient tolerated treatment well;Patient limited by pain    Behavior During Therapy [facility] for tasks assessed/performed           Past Medical History:  Diagnosis Date  . Allergy   . Decreased libido   . Diverticulitis   . GERD (gastroesophageal reflux disease)   . HLD (hyperlipidemia)   . Hydronephrosis with renal and ureteral calculus obstruction   . Hydronephrosis with renal and ureteral calculus obstruction   . Hypertension   . Hypogonadism in male   . Rhinitis, allergic   . Sleep apnea   . Stroke ([place])   . Ureteral stone     Past Surgical History:  Procedure Laterality Date  . EXTERNAL EAR SURGERY    . FINGER SURGERY    . KNEE SURGERY    . left eye      There were no vitals filed for this visit.   Subjective Assessment - 06/02/20 1134    Subjective Pt reports increased R shoulder pain prior to treatment secondary to loading car up for vacation this week. Pt reports improved ankle soreness.    Pertinent History Pt. had a right sided stroke and loves to watch football and travel to the beach with his wife.    Limitations Walking;Standing    How long can you walk comfortably? 0.2 miles before heaviness/fatigue    Patient Stated Goals Increase walking distance, carry things with less pain in shoulder     Currently in Pain? Yes    Pain Score 4     Pain Location Shoulder    Pain Orientation Right    Pain Descriptors / Indicators Aching;Discomfort    Pain Onset Yesterday    Multiple Pain Sites No          There.ex:   Nu-Step L6 for 10 min use of UE/LE   Neuro Re-Ed:   Obstacle course: ambulating without SPC over uneven surface (blue pad)/cone taps/ increasing gait speed x4 // bars: Side stepping on airex with dual task of reading letters on x5     PT Long Term Goals - 05/14/20 1238      PT LONG TERM GOAL #1   Title Pt. will be able to ambulate with least assistive device for 0.8 mile with good gait mechanics in home community to improve walking endurance.    Baseline Pt. reports he can walk 0.2 mile in his mobile home community.  3/25: increase walking endurance but not to baseline. 6/8: <0.5 miles    Time 4    Period Weeks    Status Partially Met    Target Date 06/09/20      PT LONG TERM GOAL #2   Title Pt. will improve FOTO score  to > 57 to show improvements in LE function for ADLs    Baseline 41.  3/22: 44.   6/8: 45 (limited with heavy household tasks/ increase walking)    Time 4    Period Weeks    Status Not Met    Target Date 06/09/20      PT LONG TERM GOAL #3   Title Pt. will increase R LE strength to grossly 5/5 to improve gait mechanics and LE strength for ADLs    Baseline R hip flexion 4-/5, knee flexion 4+/5, ankle everison 4+/5.  3/25:  hip flexion 4/5 MMT.  5/10: 4/5 MMT.  Limited B shoulder MMT due to pain.   6/8: see clinical impression    Time 4    Period Weeks    Status Partially Met    Target Date 06/09/20      PT LONG TERM GOAL #4   Title Pt. will increase bilat UE flexion/abduction to 120 deg. for functional mobility for overhead reaching and bathing.    Baseline R Flexion 79 with pain in deltoid, R Abduction 108; L flexion 131, L Abduction 109.  2/23: R sh. flexion >100 deg. (pain limited).  3/25:  R sh. flexion 134 deg./ abduction 114 deg.  L sh.  flexion 130 deg./ abduction 128 deg. (consistent improvement).    Time 4    Period Weeks    Status Achieved      PT LONG TERM GOAL #5   Title Pt. able to ambulate outside on grassy terrain with consistent gait pattern and no assistive device safely.    Baseline Pt. requires use of SPC for safety with gait.    Time 4    Period Weeks    Status Partially Met    Target Date 06/09/20                 Plan - 06/02/20 1136    Clinical Impression Statement Pt progressed ambulating over uneven surface with 8 ankle weights under blue pad with ability to utilize ankle and hip strategy with no need for therapist correction to prevent LOB and fall. Pt continues to have increased difficulty balancing on LLE requiring multiple attempts to raise RLE to tap cone. Pt can continue to benefot from skilled PT to improve SLS and dynamic balance to further reduce risk of falls.    Personal Factors and Comorbidities Comorbidity 2    Comorbidities Hypertension, Obesity    Examination-Activity Limitations Bed Mobility;Reach Overhead;Squat;Lift    Examination-Participation Restrictions Community Activity;Yard Work    Merchant navy officer Evolving/Moderate complexity    Rehab Potential Good    PT Frequency 2x / week    PT Duration 4 weeks    PT Treatment/Interventions ADLs/Self Care Home Management;Gait training;Stair training;Functional mobility training;Therapeutic activities;Therapeutic exercise;Balance training;Neuromuscular re-education;Manual techniques;Patient/family education    PT Next Visit Plan B LE/UE strengthening and balance training.    Consulted and Agree with Plan of Care Patient           Patient will benefit from skilled therapeutic intervention in order to improve the following deficits and impairments:  Abnormal gait, Decreased activity tolerance, Decreased balance, Decreased coordination, Decreased mobility, Decreased endurance, Decreased range of motion, Decreased  strength, Difficulty walking, Dizziness, Impaired UE functional use, Pain  Visit Diagnosis: Unspecified lack of coordination  Muscle weakness (generalized)  Other abnormalities of gait and mobility     Problem List Patient Active Problem List   Diagnosis Date Noted  . Hemiparesis affecting right side as  late effect of cerebrovascular accident ([place]) 08/13/2019  . Other sleep apnea 08/06/2019  . History of kidney stones 05/02/2018  . ED (erectile dysfunction) 05/02/2018  . Hyperglycemia 01/30/2017  . Arthritis of knee, degenerative 01/30/2017  . BPH with obstruction/lower urinary tract symptoms 02/03/2016  . Gross hematuria 02/03/2016  . Dyslipidemia 08/04/2015  . Skin lesions 07/14/2015  . Diverticulitis of large intestine without perforation or abscess without bleeding 06/30/2015  . Edema 06/30/2015  . OSA on CPAP 06/30/2015  . Kidney pain 06/30/2015   Pura Spice, PT, DPT # [id number] SPT 06/03/2020, 8:06 AM  [facility] [address] [place], [place], [address] Phone: [phone]   Fax:  [phone]  Name: Tom Underwood MRN: [id number] Date of Birth: 16-Sep-1951

## 2020-06-09 ENCOUNTER — Other Ambulatory Visit: Payer: Self-pay

## 2020-06-09 ENCOUNTER — Encounter: Payer: Self-pay | Admitting: Physical Therapy

## 2020-06-09 ENCOUNTER — Ambulatory Visit: Payer: Medicare Other | Attending: Neurology | Admitting: Physical Therapy

## 2020-06-09 DIAGNOSIS — M6281 Muscle weakness (generalized): Secondary | ICD-10-CM | POA: Insufficient documentation

## 2020-06-09 DIAGNOSIS — R2689 Other abnormalities of gait and mobility: Secondary | ICD-10-CM | POA: Diagnosis not present

## 2020-06-09 DIAGNOSIS — R279 Unspecified lack of coordination: Secondary | ICD-10-CM | POA: Diagnosis not present

## 2020-06-09 NOTE — Therapy (Signed)
[facility] [address] [place], [place], [address] Phone: [phone]   Fax:  [phone]  Physical Therapy Treatment  Patient Details  Name: Tom Underwood MRN: [id number] Date of Birth: 24-Jan-1951 Referring Provider (PT): Jennings Books, MD   Encounter Date: 06/09/2020   PT End of Session - 06/09/20 1134    Visit Number 42    Number of Visits 43    Date for PT Re-Evaluation 06/09/20    Authorization - Visit Number 8    Authorization - Number of Visits 10    PT Start Time 0945    PT Stop Time 1040    PT Time Calculation (min) 55 min    Equipment Utilized During Treatment Gait belt    Activity Tolerance Patient tolerated treatment well;Patient limited by pain    Behavior During Therapy [facility] for tasks assessed/performed           Past Medical History:  Diagnosis Date  . Allergy   . Decreased libido   . Diverticulitis   . GERD (gastroesophageal reflux disease)   . HLD (hyperlipidemia)   . Hydronephrosis with renal and ureteral calculus obstruction   . Hydronephrosis with renal and ureteral calculus obstruction   . Hypertension   . Hypogonadism in male   . Rhinitis, allergic   . Sleep apnea   . Stroke ([place])   . Ureteral stone     Past Surgical History:  Procedure Laterality Date  . EXTERNAL EAR SURGERY    . FINGER SURGERY    . KNEE SURGERY    . left eye      There were no vitals filed for this visit.   Subjective Assessment - 06/09/20 1130    Subjective Pt states performing longer bouts of walking during vacation in the mountains. Pt reports he wants to focus more on LE strength and improving his ability to asc/desc stairs. Significant R shoulder pain limited to 90 deg of passive flexion.    Pertinent History Pt. had a right sided stroke and loves to watch football and travel to the beach with his wife.    Limitations Walking;Standing    How long can you walk comfortably? 0.2 miles before heaviness/fatigue     Patient Stated Goals Increase walking distance, carry things with less pain in shoulder    Currently in Pain? Yes    Pain Location Shoulder    Pain Orientation Right    Pain Descriptors / Indicators Sharp    Pain Type Chronic pain    Pain Onset Yesterday    Multiple Pain Sites No          There.ex:  Nu-Step L6 for 10 min use of UE/LE  Neuro Re-Ed:  In // bars: side-stepping/ walking forwards/ reciprocal marches hands to knee. x4 each   Stair training: x4 use of cane x2, no cane x2. Educated on up with the good, down with the bad for asc/desc stairs. LLE displays weakness with asc relying heavily on single handrail and poor eccentric control with LLE desc stairs.  Reciprocal gait over 3 hurdles and alternating cone taps to improve Single leg balance with increased difficulty standing on RLE requiring multiple attempts for cone taps due to minor LOB. x6    PT Long Term Goals - 05/14/20 1238      PT LONG TERM GOAL #1   Title Pt. will be able to ambulate with least assistive device for 0.8 mile with good gait mechanics in home community  to improve walking endurance.    Baseline Pt. reports he can walk 0.2 mile in his mobile home community.  3/25: increase walking endurance but not to baseline. 6/8: <0.5 miles    Time 4    Period Weeks    Status Partially Met    Target Date 06/09/20      PT LONG TERM GOAL #2   Title Pt. will improve FOTO score to > 57 to show improvements in LE function for ADLs    Baseline 41.  3/22: 44.   6/8: 45 (limited with heavy household tasks/ increase walking)    Time 4    Period Weeks    Status Not Met    Target Date 06/09/20      PT LONG TERM GOAL #3   Title Pt. will increase R LE strength to grossly 5/5 to improve gait mechanics and LE strength for ADLs    Baseline R hip flexion 4-/5, knee flexion 4+/5, ankle everison 4+/5.  3/25:  hip flexion 4/5 MMT.  5/10: 4/5 MMT.  Limited B shoulder MMT due to pain.   6/8: see clinical impression    Time 4     Period Weeks    Status Partially Met    Target Date 06/09/20      PT LONG TERM GOAL #4   Title Pt. will increase bilat UE flexion/abduction to 120 deg. for functional mobility for overhead reaching and bathing.    Baseline R Flexion 79 with pain in deltoid, R Abduction 108; L flexion 131, L Abduction 109.  2/23: R sh. flexion >100 deg. (pain limited).  3/25:  R sh. flexion 134 deg./ abduction 114 deg.  L sh. flexion 130 deg./ abduction 128 deg. (consistent improvement).    Time 4    Period Weeks    Status Achieved      PT LONG TERM GOAL #5   Title Pt. able to ambulate outside on grassy terrain with consistent gait pattern and no assistive device safely.    Baseline Pt. requires use of SPC for safety with gait.    Time 4    Period Weeks    Status Partially Met    Target Date 06/09/20                 Plan - 06/09/20 1135    Clinical Impression Statement Pt displays continued decreased stance time on RLE during dynamic balance due to RLE weakness and ambulates with antalgic gait on RLE with SPC with decreased R side step length. Pt asc/desc stairs with step-to pattern with SPC. Pt educated on "up with the good and down with the bad" for asc/desc stairs. However pt has improved mechanics on stairs with leading with RLE due to L knee weakness upon asc and decreased eccentric control with descending. Pt can continue to benefit from skilled PT treatment to improve BLE balance to reduce falls.    Personal Factors and Comorbidities Comorbidity 2    Comorbidities Hypertension, Obesity    Examination-Activity Limitations Bed Mobility;Reach Overhead;Squat;Lift    Examination-Participation Restrictions Community Activity;Yard Work    Merchant [employer] officer Evolving/Moderate complexity    Clinical Decision Making Moderate    Rehab Potential Good    PT Frequency 2x / week    PT Duration 4 weeks    PT Treatment/Interventions ADLs/Self Care Home Management;Gait training;Stair  training;Functional mobility training;Therapeutic activities;Therapeutic exercise;Balance training;Neuromuscular re-education;Manual techniques;Patient/family education    PT Next Visit Plan B LE/UE strengthening and balance training.  RECERT/ CHECK  GOALS and SCHEDULE next tx. session    Consulted and Agree with Plan of Care Patient           Patient will benefit from skilled therapeutic intervention in order to improve the following deficits and impairments:  Abnormal gait, Decreased activity tolerance, Decreased balance, Decreased coordination, Decreased mobility, Decreased endurance, Decreased range of motion, Decreased strength, Difficulty walking, Dizziness, Impaired UE functional use, Pain  Visit Diagnosis: Unspecified lack of coordination  Muscle weakness (generalized)  Other abnormalities of gait and mobility     Problem List Patient Active Problem List   Diagnosis Date Noted  . Hemiparesis affecting right side as late effect of cerebrovascular accident ([place]) 08/13/2019  . Other sleep apnea 08/06/2019  . History of kidney stones 05/02/2018  . ED (erectile dysfunction) 05/02/2018  . Hyperglycemia 01/30/2017  . Arthritis of knee, degenerative 01/30/2017  . BPH with obstruction/lower urinary tract symptoms 02/03/2016  . Gross hematuria 02/03/2016  . Dyslipidemia 08/04/2015  . Skin lesions 07/14/2015  . Diverticulitis of large intestine without perforation or abscess without bleeding 06/30/2015  . Edema 06/30/2015  . OSA on CPAP 06/30/2015  . Kidney pain 06/30/2015   Pura Spice, PT, DPT # [id number] SPT 06/09/2020, 3:10 PM  [facility] [address] [place], [place], [address] Phone: [phone]   Fax:  [phone]  Name: Tom Underwood MRN: [id number] Date of Birth: 1951/06/16

## 2020-06-11 ENCOUNTER — Ambulatory Visit: Payer: Medicare Other | Admitting: Physical Therapy

## 2020-06-15 ENCOUNTER — Ambulatory Visit: Payer: Medicare Other | Admitting: Physical Therapy

## 2020-06-15 ENCOUNTER — Other Ambulatory Visit: Payer: Self-pay

## 2020-06-15 ENCOUNTER — Encounter: Payer: Self-pay | Admitting: Physical Therapy

## 2020-06-15 DIAGNOSIS — M6281 Muscle weakness (generalized): Secondary | ICD-10-CM | POA: Diagnosis not present

## 2020-06-15 DIAGNOSIS — R279 Unspecified lack of coordination: Secondary | ICD-10-CM

## 2020-06-15 DIAGNOSIS — R2689 Other abnormalities of gait and mobility: Secondary | ICD-10-CM | POA: Diagnosis not present

## 2020-06-15 NOTE — Therapy (Signed)
[facility] [address] [place], [place], [address] Phone: [phone]   Fax:  [phone]  Physical Therapy Treatment  Patient Details  Name: Tom Underwood MRN: [id number] Date of Birth: 1951/07/28 Referring Provider (PT): Jennings Books, MD   Encounter Date: 06/15/2020   PT End of Session - 06/15/20 1140    Visit Number 43    Number of Visits 51    Date for PT Re-Evaluation 07/13/20    Authorization - Visit Number 1    Authorization - Number of Visits 10    PT Start Time 0910    PT Stop Time 1010    PT Time Calculation (min) 60 min    Equipment Utilized During Treatment Gait belt    Activity Tolerance Patient tolerated treatment well;Patient limited by pain    Behavior During Therapy [facility] for tasks assessed/performed           Past Medical History:  Diagnosis Date  . Allergy   . Decreased libido   . Diverticulitis   . GERD (gastroesophageal reflux disease)   . HLD (hyperlipidemia)   . Hydronephrosis with renal and ureteral calculus obstruction   . Hydronephrosis with renal and ureteral calculus obstruction   . Hypertension   . Hypogonadism in male   . Rhinitis, allergic   . Sleep apnea   . Stroke ([place])   . Ureteral stone     Past Surgical History:  Procedure Laterality Date  . EXTERNAL EAR SURGERY    . FINGER SURGERY    . KNEE SURGERY    . left eye      There were no vitals filed for this visit.   Subjective Assessment - 06/15/20 1138    Subjective Pt reports 6/10 pain via NPS in R shoulder with elevation and abduction and reports seeing orthopedic specialist for it this following Monday.    Pertinent History Pt. had a right sided stroke and loves to watch football and travel to the beach with his wife.    Limitations Walking;Standing    How long can you walk comfortably? 0.2 miles before heaviness/fatigue    Patient Stated Goals Increase walking distance, carry things with less pain in shoulder     Currently in Pain? Yes    Pain Score 6     Pain Location Shoulder    Pain Orientation Right    Pain Descriptors / Indicators Sharp;Discomfort    Pain Type Chronic pain    Pain Onset More than a month ago    Pain Frequency Intermittent    Multiple Pain Sites No          There.ex:  Nu-Step: L6 for 10 min, use of UE's and LE's. // bars: 6" step ups B. 1x12. Increased difficulty stepping up with LLE due to RLE hip flexion weakness and balance on LLE. Demonstrated poor eccentric control B however worse eccentric control on LLE>RLE.  Reassessed BLE strength: 4+/5 for R hip flex, R knee flex, and R knee extension.  Reassessed FOTO: scored 56, with a goal of > 57.  Neuro Re-Ed:   Reassessed balance on uneven terrain outdoors without AD. Able to safely ambulate over grass and uneven parking lots without SPC and no lateral sway or LOB. Displays antalgic gait on RLE and decreased RLE step length.   Reassessed walking distance with SPC. Tolerated ambulating 238' before requiring seated rest break. Displays antalgic gait on RLE and decreased RLE step length.     PT Long  Term Goals - 06/15/20 1148      PT LONG TERM GOAL #1   Title Pt. will be able to ambulate with least assistive device for 0.8 mile with good gait mechanics in home community to improve walking endurance.    Baseline Pt. reports he can walk 0.2 mile in his mobile home community.  3/25: increase walking endurance but not to baseline. 6/8: <0.5 miles; 06/15/2020: unable to walk beyond .2 miles at home. Reports ambulating beyond a quarter mile when in mountains on vacation.    Time 4    Period Weeks    Status Partially Met    Target Date 07/13/20      PT LONG TERM GOAL #2   Title Pt. will improve FOTO score to > 57 to show improvements in LE function for ADLs    Baseline 41.  3/22: 44.   6/8: 45 (limited with heavy household tasks/ increase walking); 06/15/2020: 58    Time 0    Period Weeks    Status Achieved    Target Date  06/15/20      PT LONG TERM GOAL #3   Title Pt. will increase R LE strength to grossly 5/5 to improve gait mechanics and LE strength for ADLs    Baseline R hip flexion 4-/5, knee flexion 4+/5, ankle everison 4+/5.  3/25:  hip flexion 4/5 MMT.  5/10: 4/5 MMT.  Limited B shoulder MMT due to pain.   6/8: see clinical impression; 06/15/2020: 4+/5 for R hip flexion, knee flexion, and quad.    Time 0    Period Weeks    Status Partially Met    Target Date 07/13/20      PT LONG TERM GOAL #4   Title Pt. will increase bilat UE flexion/abduction to 120 deg. for functional mobility for overhead reaching and bathing.    Baseline R Flexion 79 with pain in deltoid, R Abduction 108; L flexion 131, L Abduction 109.  2/23: R sh. flexion >100 deg. (pain limited).  3/25:  R sh. flexion 134 deg./ abduction 114 deg.  L sh. flexion 130 deg./ abduction 128 deg. (consistent improvement).    Time 4    Period Weeks    Status Not Met    Target Date 07/13/20      PT LONG TERM GOAL #5   Title Pt. able to ambulate outside on grassy terrain with consistent gait pattern and no assistive device safely.    Baseline Pt. requires use of SPC for safety with gait.    Time 0    Period Weeks    Status Achieved    Target Date 07/13/20                 Plan - 06/15/20 1141    Clinical Impression Statement Pt's goals reassessed today for recert. Pt continues to display difficulty with walking beyond 0.2 miles with LRAD. Pt only able to ambulate 238' with SPC before requiring a seated rest break due to fatigue. Pt however did accomplish FOTO goal with a score of 58. Pt continues to display RLE weakness that is not 5/5. Pt continues to present with R hip flexion weakness scored at 4+/5 and R quad/hamstring at 4+/5. Pt's largest c/o is his R shoulder pain and function. Pt only able to elevate R shoulder ~45 deg secondary to pain. Pt was safe ambulating in grass and uneven surfaces without AD with no lateral sway and no LOB. Pt's  balance continues to improve accomplishing safely  being able to ambulate over uneven surfaces outside. Pt demonstrates RLE weakness > LLE and limited R shoulder AROM in flex/abd due to pain. Pt can continue to benefit from skilled PT treatment to further improve BLE strength and gait mechanics due to pt demonstrating R antalgic gait.    Personal Factors and Comorbidities Comorbidity 2    Comorbidities Hypertension, Obesity    Examination-Activity Limitations Bed Mobility;Reach Overhead;Squat;Lift    Examination-Participation Restrictions Community Activity;Yard Work    Merchant [employer] officer Evolving/Moderate complexity    Rehab Potential Good    PT Frequency 2x / week    PT Duration 4 weeks    PT Treatment/Interventions ADLs/Self Care Home Management;Gait training;Stair training;Functional mobility training;Therapeutic activities;Therapeutic exercise;Balance training;Neuromuscular re-education;Manual techniques;Patient/family education    PT Next Visit Plan Progress tx focus on BLE strength and higher level dynamic balance.    Consulted and Agree with Plan of Care Patient           Patient will benefit from skilled therapeutic intervention in order to improve the following deficits and impairments:  Abnormal gait, Decreased activity tolerance, Decreased balance, Decreased coordination, Decreased mobility, Decreased endurance, Decreased range of motion, Decreased strength, Difficulty walking, Dizziness, Impaired UE functional use, Pain  Visit Diagnosis: Unspecified lack of coordination  Muscle weakness (generalized)  Other abnormalities of gait and mobility     Problem List Patient Active Problem List   Diagnosis Date Noted  . Hemiparesis affecting right side as late effect of cerebrovascular accident ([place]) 08/13/2019  . Other sleep apnea 08/06/2019  . History of kidney stones 05/02/2018  . ED (erectile dysfunction) 05/02/2018  . Hyperglycemia 01/30/2017  . Arthritis  of knee, degenerative 01/30/2017  . BPH with obstruction/lower urinary tract symptoms 02/03/2016  . Gross hematuria 02/03/2016  . Dyslipidemia 08/04/2015  . Skin lesions 07/14/2015  . Diverticulitis of large intestine without perforation or abscess without bleeding 06/30/2015  . Edema 06/30/2015  . OSA on CPAP 06/30/2015  . Kidney pain 06/30/2015   Pura Spice, PT, DPT # [id number], SPT 06/16/2020, 8:52 AM  [place] [facility] [address] [place], [place], [address] Phone: [phone]   Fax:  [phone]  Name: Tom Underwood MRN: [id number] Date of Birth: 1951/08/01

## 2020-06-18 ENCOUNTER — Encounter: Payer: Self-pay | Admitting: Physical Therapy

## 2020-06-18 ENCOUNTER — Other Ambulatory Visit: Payer: Self-pay

## 2020-06-18 ENCOUNTER — Ambulatory Visit: Payer: Medicare Other | Admitting: Physical Therapy

## 2020-06-18 DIAGNOSIS — R279 Unspecified lack of coordination: Secondary | ICD-10-CM | POA: Diagnosis not present

## 2020-06-18 DIAGNOSIS — M6281 Muscle weakness (generalized): Secondary | ICD-10-CM

## 2020-06-18 DIAGNOSIS — R2689 Other abnormalities of gait and mobility: Secondary | ICD-10-CM | POA: Diagnosis not present

## 2020-06-18 NOTE — Therapy (Signed)
[place] [facility] [address] [place], [place], [address] Phone: [phone]   Fax:  [phone]  Physical Therapy Treatment  Patient Details  Name: Tom Underwood MRN: [id number] Date of Birth: 11-02-1951 Referring Provider (PT): Jennings Books, MD   Encounter Date: 06/18/2020   PT End of Session - 06/18/20 1641    Visit Number 44    Number of Visits 51    Date for PT Re-Evaluation 07/13/20    Authorization - Visit Number 2    Authorization - Number of Visits 10    PT Start Time 1520    PT Stop Time 1610    PT Time Calculation (min) 50 min    Equipment Utilized During Treatment Gait belt    Activity Tolerance Patient tolerated treatment well;Patient limited by pain    Behavior During Therapy [facility] for tasks assessed/performed           Past Medical History:  Diagnosis Date  . Allergy   . Decreased libido   . Diverticulitis   . GERD (gastroesophageal reflux disease)   . HLD (hyperlipidemia)   . Hydronephrosis with renal and ureteral calculus obstruction   . Hydronephrosis with renal and ureteral calculus obstruction   . Hypertension   . Hypogonadism in male   . Rhinitis, allergic   . Sleep apnea   . Stroke ([place])   . Ureteral stone     Past Surgical History:  Procedure Laterality Date  . EXTERNAL EAR SURGERY    . FINGER SURGERY    . KNEE SURGERY    . left eye      There were no vitals filed for this visit.   Subjective Assessment - 06/18/20 1636    Subjective Pt reports 3/10 NPS in R shoulder and states he is seeing orthopedist on monday.    Pertinent History Pt. had a right sided stroke and loves to watch football and travel to the beach with his wife.    Limitations Walking;Standing    How long can you walk comfortably? 0.2 miles before heaviness/fatigue    Patient Stated Goals Increase walking distance, carry things with less pain in shoulder    Currently in Pain? Yes    Pain Score 3     Pain Location  Shoulder    Pain Orientation Right    Pain Onset More than a month ago    Multiple Pain Sites No          There.ex:   Nu-Step L6 with UE's/LE's. Pt performed 0.62 miles  STS from standard chair holding 5 lbs DB: 2x10  STS from standard chair with airex pad under feet: 2x5, with needed momentum shift to gain balance.  STS from standard chair on level ground: 1x5  Resisted walking on nautilus: 60 lbs. x5 forward, x5 backward, x3 side step to R, x3 side step to L      PT Long Term Goals - 06/15/20 1148      PT LONG TERM GOAL #1   Title Pt. will be able to ambulate with least assistive device for 0.8 mile with good gait mechanics in home community to improve walking endurance.    Baseline Pt. reports he can walk 0.2 mile in his mobile home community.  3/25: increase walking endurance but not to baseline. 6/8: <0.5 miles; 06/15/2020: unable to walk beyond .2 miles at home. Reports ambulating beyond a quarter mile when in mountains on vacation.    Time 4  Period Weeks    Status Partially Met    Target Date 07/13/20      PT LONG TERM GOAL #2   Title Pt. will improve FOTO score to > 57 to show improvements in LE function for ADLs    Baseline 41.  3/22: 44.   6/8: 45 (limited with heavy household tasks/ increase walking); 06/15/2020: 58    Time 0    Period Weeks    Status Achieved    Target Date 06/15/20      PT LONG TERM GOAL #3   Title Pt. will increase R LE strength to grossly 5/5 to improve gait mechanics and LE strength for ADLs    Baseline R hip flexion 4-/5, knee flexion 4+/5, ankle everison 4+/5.  3/25:  hip flexion 4/5 MMT.  5/10: 4/5 MMT.  Limited B shoulder MMT due to pain.   6/8: see clinical impression; 06/15/2020: 4+/5 for R hip flexion, knee flexion, and quad.    Time 0    Period Weeks    Status Partially Met    Target Date 07/13/20      PT LONG TERM GOAL #4   Title Pt. will increase bilat UE flexion/abduction to 120 deg. for functional mobility for overhead  reaching and bathing.    Baseline R Flexion 79 with pain in deltoid, R Abduction 108; L flexion 131, L Abduction 109.  2/23: R sh. flexion >100 deg. (pain limited).  3/25:  R sh. flexion 134 deg./ abduction 114 deg.  L sh. flexion 130 deg./ abduction 128 deg. (consistent improvement).    Time 4    Period Weeks    Status Not Met    Target Date 07/13/20      PT LONG TERM GOAL #5   Title Pt. able to ambulate outside on grassy terrain with consistent gait pattern and no assistive device safely.    Baseline Pt. requires use of SPC for safety with gait.    Time 0    Period Weeks    Status Achieved    Target Date 07/13/20                 Plan - 06/18/20 1641    Clinical Impression Statement Pt progressing in BLE strengthening. Pt able to perform STS's with 5 lbs DB in hand with good technique. Pt able to progress STS's with feet on airex pad to challenege balance and increase knee flexion making STS more difficult. Pt required occasional rocking in seat to gain momentum to perform STS. Pt can continue to benefit from skilled PT treatment to improve BLE strength so pt can ambulate community distances with less fatigue.    Personal Factors and Comorbidities Comorbidity 2    Comorbidities Hypertension, Obesity    Examination-Activity Limitations Bed Mobility;Reach Overhead;Squat;Lift    Examination-Participation Restrictions Community Activity;Yard Work    Merchant [employer] officer Evolving/Moderate complexity    Rehab Potential Good    PT Frequency 2x / week    PT Duration 4 weeks    PT Treatment/Interventions ADLs/Self Care Home Management;Gait training;Stair training;Functional mobility training;Therapeutic activities;Therapeutic exercise;Balance training;Neuromuscular re-education;Manual techniques;Patient/family education    PT Next Visit Plan Progress tx focus on BLE strength and higher level dynamic balance.    Consulted and Agree with Plan of Care Patient            Patient will benefit from skilled therapeutic intervention in order to improve the following deficits and impairments:  Abnormal gait, Decreased activity tolerance, Decreased balance, Decreased coordination,  Decreased mobility, Decreased endurance, Decreased range of motion, Decreased strength, Difficulty walking, Dizziness, Impaired UE functional use, Pain  Visit Diagnosis: Unspecified lack of coordination  Muscle weakness (generalized)  Other abnormalities of gait and mobility     Problem List Patient Active Problem List   Diagnosis Date Noted  . Hemiparesis affecting right side as late effect of cerebrovascular accident ([place]) 08/13/2019  . Other sleep apnea 08/06/2019  . History of kidney stones 05/02/2018  . ED (erectile dysfunction) 05/02/2018  . Hyperglycemia 01/30/2017  . Arthritis of knee, degenerative 01/30/2017  . BPH with obstruction/lower urinary tract symptoms 02/03/2016  . Gross hematuria 02/03/2016  . Dyslipidemia 08/04/2015  . Skin lesions 07/14/2015  . Diverticulitis of large intestine without perforation or abscess without bleeding 06/30/2015  . Edema 06/30/2015  . OSA on CPAP 06/30/2015  . Kidney pain 06/30/2015   Pura Spice, PT, DPT # [id number] Larna Daughters, SPT 06/19/2020, 9:47 AM  [place] [facility] [address] [place], [place], [address] Phone: [phone]   Fax:  [phone]  Name: Tom Underwood MRN: [id number] Date of Birth: 01-18-1951

## 2020-06-22 DIAGNOSIS — M25511 Pain in right shoulder: Secondary | ICD-10-CM | POA: Diagnosis not present

## 2020-06-23 ENCOUNTER — Other Ambulatory Visit: Payer: Self-pay

## 2020-06-23 ENCOUNTER — Ambulatory Visit: Payer: Medicare Other | Admitting: Physical Therapy

## 2020-06-23 ENCOUNTER — Encounter: Payer: Self-pay | Admitting: Physical Therapy

## 2020-06-23 DIAGNOSIS — R2689 Other abnormalities of gait and mobility: Secondary | ICD-10-CM | POA: Diagnosis not present

## 2020-06-23 DIAGNOSIS — M6281 Muscle weakness (generalized): Secondary | ICD-10-CM

## 2020-06-23 DIAGNOSIS — R279 Unspecified lack of coordination: Secondary | ICD-10-CM

## 2020-06-23 NOTE — Therapy (Signed)
[facility] [address] [place], [place], [address] Phone: [phone]   Fax:  [phone]  Physical Therapy Treatment  Patient Details  Name: Tom Underwood MRN: [id number] Date of Birth: 12-05-1951 Referring Provider (PT): Jennings Books, MD   Encounter Date: 06/23/2020   PT End of Session - 06/23/20 1556    Visit Number 45    Number of Visits 51    Date for PT Re-Evaluation 07/13/20    Authorization - Visit Number 3    Authorization - Number of Visits 10    PT Start Time 4854    PT Stop Time 1535    PT Time Calculation (min) 60 min    Equipment Utilized During Treatment Gait belt    Activity Tolerance Patient tolerated treatment well;Patient limited by pain    Behavior During Therapy [facility] for tasks assessed/performed           Past Medical History:  Diagnosis Date  . Allergy   . Decreased libido   . Diverticulitis   . GERD (gastroesophageal reflux disease)   . HLD (hyperlipidemia)   . Hydronephrosis with renal and ureteral calculus obstruction   . Hydronephrosis with renal and ureteral calculus obstruction   . Hypertension   . Hypogonadism in male   . Rhinitis, allergic   . Sleep apnea   . Stroke ([place])   . Ureteral stone     Past Surgical History:  Procedure Laterality Date  . EXTERNAL EAR SURGERY    . FINGER SURGERY    . KNEE SURGERY    . left eye      There were no vitals filed for this visit.   Subjective Assessment - 06/23/20 1554    Subjective Pt reports continued pain in R shoulder and significantly limited R shoulder AROM. Pt states he saw orthopedic for R shoulder and is receiving MRI in the near future due to suspicion of potentil RTC tear.    Pertinent History Pt. had a right sided stroke and loves to watch football and travel to the beach with his wife.    Limitations Walking;Standing    How long can you walk comfortably? 0.2 miles before heaviness/fatigue    Patient Stated Goals Increase  walking distance, carry things with less pain in shoulder    Currently in Pain? Yes    Pain Score 4     Pain Location Shoulder    Pain Orientation Right    Pain Descriptors / Indicators Aching;Discomfort    Pain Onset More than a month ago    Pain Frequency Intermittent    Multiple Pain Sites No          There.ex:   Nu-Step L6 for 10 min. Use of UE's/LE's. Able to get to 0.61 miles. STS with 5 lbs DB: 2x10 STS with 5 lbs DB on airex from blue mat table at 19" tall. 2x12. Required higher chair height due to airex pad creating increased knee flexion mimicking standing from a lower surface.  STS with 6 lbs DB on airex from blue mat table at 19" tall: 1x12  Neuro Re-Ed:  Obstacle course with B 5 lbs ankle weights: navigating unstable surface (blue mat with ankle weights underneath)/ B cone taps (5/foot), and B side step ups on 6" step with BUE support on handrail of stairs (5 reps/leg first time then 3 reps/leg on second set): x2     PT Long Term Goals - 06/15/20 1148  PT LONG TERM GOAL #1   Title Pt. will be able to ambulate with least assistive device for 0.8 mile with good gait mechanics in home community to improve walking endurance.    Baseline Pt. reports he can walk 0.2 mile in his mobile home community.  3/25: increase walking endurance but not to baseline. 6/8: <0.5 miles; 06/15/2020: unable to walk beyond .2 miles at home. Reports ambulating beyond a quarter mile when in mountains on vacation.    Time 4    Period Weeks    Status Partially Met    Target Date 07/13/20      PT LONG TERM GOAL #2   Title Pt. will improve FOTO score to > 57 to show improvements in LE function for ADLs    Baseline 41.  3/22: 44.   6/8: 45 (limited with heavy household tasks/ increase walking); 06/15/2020: 58    Time 0    Period Weeks    Status Achieved    Target Date 06/15/20      PT LONG TERM GOAL #3   Title Pt. will increase R LE strength to grossly 5/5 to improve gait mechanics and LE  strength for ADLs    Baseline R hip flexion 4-/5, knee flexion 4+/5, ankle everison 4+/5.  3/25:  hip flexion 4/5 MMT.  5/10: 4/5 MMT.  Limited B shoulder MMT due to pain.   6/8: see clinical impression; 06/15/2020: 4+/5 for R hip flexion, knee flexion, and quad.    Time 0    Period Weeks    Status Partially Met    Target Date 07/13/20      PT LONG TERM GOAL #4   Title Pt. will increase bilat UE flexion/abduction to 120 deg. for functional mobility for overhead reaching and bathing.    Baseline R Flexion 79 with pain in deltoid, R Abduction 108; L flexion 131, L Abduction 109.  2/23: R sh. flexion >100 deg. (pain limited).  3/25:  R sh. flexion 134 deg./ abduction 114 deg.  L sh. flexion 130 deg./ abduction 128 deg. (consistent improvement).    Time 4    Period Weeks    Status Not Met    Target Date 07/13/20      PT LONG TERM GOAL #5   Title Pt. able to ambulate outside on grassy terrain with consistent gait pattern and no assistive device safely.    Baseline Pt. requires use of SPC for safety with gait.    Time 0    Period Weeks    Status Achieved    Target Date 07/13/20                 Plan - 06/23/20 1556    Clinical Impression Statement Although limited in R shoulder AROM and pain, pt able to continue with BLE dynamic balance and strengthening. Pt progressing to STS's on airexpad and added resistance of a 6 lbs DB without therapist assist. Due to airex pad increasing B knee flexion pt required blut tablet raised to 19" high in order to stand. This demonstrates pt hs decreased leg strength coming to standing from a low surface. Pt can continue to benefit from skilled PT treatment to improved BLE strength and endurance so pt can safely ambulate community distances independently with less fatigue and LRAD.    Personal Factors and Comorbidities Comorbidity 2    Comorbidities Hypertension, Obesity    Examination-Activity Limitations Bed Mobility;Reach Overhead;Squat;Lift     Examination-Participation Restrictions Community Activity;[employer] Leaver Work  Stability/Clinical Decision Making Evolving/Moderate complexity    Rehab Potential Good    PT Frequency 2x / week    PT Duration 4 weeks    PT Treatment/Interventions ADLs/Self Care Home Management;Gait training;Stair training;Functional mobility training;Therapeutic activities;Therapeutic exercise;Balance training;Neuromuscular re-education;Manual techniques;Patient/family education    PT Next Visit Plan Progress tx focus on BLE strength and higher level dynamic balance.    Consulted and Agree with Plan of Care Patient           Patient will benefit from skilled therapeutic intervention in order to improve the following deficits and impairments:  Abnormal gait, Decreased activity tolerance, Decreased balance, Decreased coordination, Decreased mobility, Decreased endurance, Decreased range of motion, Decreased strength, Difficulty walking, Dizziness, Impaired UE functional use, Pain  Visit Diagnosis: Unspecified lack of coordination  Muscle weakness (generalized)  Other abnormalities of gait and mobility     Problem List Patient Active Problem List   Diagnosis Date Noted  . Hemiparesis affecting right side as late effect of cerebrovascular accident ([place]) 08/13/2019  . Other sleep apnea 08/06/2019  . History of kidney stones 05/02/2018  . ED (erectile dysfunction) 05/02/2018  . Hyperglycemia 01/30/2017  . Arthritis of knee, degenerative 01/30/2017  . BPH with obstruction/lower urinary tract symptoms 02/03/2016  . Gross hematuria 02/03/2016  . Dyslipidemia 08/04/2015  . Skin lesions 07/14/2015  . Diverticulitis of large intestine without perforation or abscess without bleeding 06/30/2015  . Edema 06/30/2015  . OSA on CPAP 06/30/2015  . Kidney pain 06/30/2015   Pura Spice, PT, DPT # [id number] SPT 06/23/2020, 5:42 PM  [facility] [address] [place], [place], [address] Phone: [phone]   Fax:  [phone]  Name: Tom Underwood MRN: [id number] Date of Birth: 12/19/1950

## 2020-06-25 ENCOUNTER — Encounter: Payer: Self-pay | Admitting: Physical Therapy

## 2020-06-25 ENCOUNTER — Other Ambulatory Visit: Payer: Self-pay

## 2020-06-25 ENCOUNTER — Ambulatory Visit: Payer: Medicare Other | Admitting: Physical Therapy

## 2020-06-25 DIAGNOSIS — R279 Unspecified lack of coordination: Secondary | ICD-10-CM | POA: Diagnosis not present

## 2020-06-25 DIAGNOSIS — R2689 Other abnormalities of gait and mobility: Secondary | ICD-10-CM | POA: Diagnosis not present

## 2020-06-25 DIAGNOSIS — M6281 Muscle weakness (generalized): Secondary | ICD-10-CM

## 2020-06-25 NOTE — Therapy (Signed)
[place] [address] [place], [place], [address] Phone: [phone]   Fax:  [phone]  Physical Therapy Treatment  Patient Details  Name: Tom Underwood MRN: [id number] Date of Birth: Apr 23, 1951 Referring Provider (PT): Jennings Books, MD   Encounter Date: 06/25/2020   PT End of Session - 06/25/20 1619    Visit Number 46    Number of Visits 51    Date for PT Re-Evaluation 07/13/20    Authorization - Visit Number 4    Authorization - Number of Visits 10    PT Start Time 1509    PT Stop Time 1600    PT Time Calculation (min) 51 min    Equipment Utilized During Treatment Gait belt    Activity Tolerance Patient tolerated treatment well;Patient limited by pain    Behavior During Therapy [facility] for tasks assessed/performed           Past Medical History:  Diagnosis Date  . Allergy   . Decreased libido   . Diverticulitis   . GERD (gastroesophageal reflux disease)   . HLD (hyperlipidemia)   . Hydronephrosis with renal and ureteral calculus obstruction   . Hydronephrosis with renal and ureteral calculus obstruction   . Hypertension   . Hypogonadism in male   . Rhinitis, allergic   . Sleep apnea   . Stroke ([place])   . Ureteral stone     Past Surgical History:  Procedure Laterality Date  . EXTERNAL EAR SURGERY    . FINGER SURGERY    . KNEE SURGERY    . left eye      There were no vitals filed for this visit.   Subjective Assessment - 06/25/20 1615    Subjective Pt states MRI is scheduled on August 2nd for R shoulder. No falls. Pt reports walking around home for exercise due to heat outside.    Pertinent History Pt. had a right sided stroke and loves to watch football and travel to the beach with his wife.    Limitations Walking;Standing    How long can you walk comfortably? 0.2 miles before heaviness/fatigue    Patient Stated Goals Increase walking distance, carry things with less pain in shoulder    Currently in  Pain? Yes    Pain Score 5     Pain Location Shoulder    Pain Orientation Right    Pain Descriptors / Indicators Aching;Discomfort    Pain Onset More than a month ago    Pain Frequency Intermittent    Multiple Pain Sites No         There.ex:   Nu-Step L5 for 10 min. UE's/LE's use. Able to get to 0.64 miles 6" step ups Forward/L side step/R side step: 1x8 forward, 1x5 R and L side step  Side steps with 3 lbs ankle weights on airex strip to improve Lat hip strength   Neuro Re-Ed:   Resisted walking in Nautilus: 50 lbs  Forwards with cone taps x5  Backwards with cone taps x5. Increased difficulty on eccentric control phase with cone taps. Increased walking speed due to weight pulling pt forward with decreased control  Alternating steps over small/large hurdle (3 small, 2 large) 3 lbs ankle weights: x5. To improve hip flexion strength for improving swing phase of gait to reduce shuffling gait and decreased step length. Difficulty stepping over large hurdles. Verbal cues for form/technique to improve foot clearance over tall hurdles.   Walking 1 lap around gym with 3  lbs ankle weights with proper gait mechanics of B heel strike, equal step length and improved hip flexion during swing phase.     PT Long Term Goals - 06/15/20 1148      PT LONG TERM GOAL #1   Title Pt. will be able to ambulate with least assistive device for 0.8 mile with good gait mechanics in home community to improve walking endurance.    Baseline Pt. reports he can walk 0.2 mile in his mobile home community.  3/25: increase walking endurance but not to baseline. 6/8: <0.5 miles; 06/15/2020: unable to walk beyond .2 miles at home. Reports ambulating beyond a quarter mile when in mountains on vacation.    Time 4    Period Weeks    Status Partially Met    Target Date 07/13/20      PT LONG TERM GOAL #2   Title Pt. will improve FOTO score to > 57 to show improvements in LE function for ADLs    Baseline 41.  3/22: 44.    6/8: 45 (limited with heavy household tasks/ increase walking); 06/15/2020: 58    Time 0    Period Weeks    Status Achieved    Target Date 06/15/20      PT LONG TERM GOAL #3   Title Pt. will increase R LE strength to grossly 5/5 to improve gait mechanics and LE strength for ADLs    Baseline R hip flexion 4-/5, knee flexion 4+/5, ankle everison 4+/5.  3/25:  hip flexion 4/5 MMT.  5/10: 4/5 MMT.  Limited B shoulder MMT due to pain.   6/8: see clinical impression; 06/15/2020: 4+/5 for R hip flexion, knee flexion, and quad.    Time 0    Period Weeks    Status Partially Met    Target Date 07/13/20      PT LONG TERM GOAL #4   Title Pt. will increase bilat UE flexion/abduction to 120 deg. for functional mobility for overhead reaching and bathing.    Baseline R Flexion 79 with pain in deltoid, R Abduction 108; L flexion 131, L Abduction 109.  2/23: R sh. flexion >100 deg. (pain limited).  3/25:  R sh. flexion 134 deg./ abduction 114 deg.  L sh. flexion 130 deg./ abduction 128 deg. (consistent improvement).    Time 4    Period Weeks    Status Not Met    Target Date 07/13/20      PT LONG TERM GOAL #5   Title Pt. able to ambulate outside on grassy terrain with consistent gait pattern and no assistive device safely.    Baseline Pt. requires use of SPC for safety with gait.    Time 0    Period Weeks    Status Achieved    Target Date 07/13/20            Plan - 06/25/20 1619    Clinical Impression Statement Pt improving in amount of standing exercise before needing a seated rest break. Pt progressing with dynamic balance with ability to perform resisted walking and alternating cone taps without LOB. Pt had increased difficulty with backwards resisted walking and cone taps on the eccentric pahse controlling the resistance compared to forward, demonstrating weakness in glutes and hamstrings. Pt continues to shuffle feet during gait and have decreased hip flexion during swing phase and difficulty with  walking over hurdles without taking multiple steps intermittently for balance in // bars. Pt can continue to benefit from PT to improve BLE  strength/endurance and improve gait to reduce risk of falls with LRAD.    Personal Factors and Comorbidities Comorbidity 2    Comorbidities Hypertension, Obesity    Examination-Activity Limitations Bed Mobility;Reach Overhead;Squat;Lift    Examination-Participation Restrictions Community Activity;Yard Work    Merchant navy officer Evolving/Moderate complexity    Rehab Potential Good    PT Frequency 2x / week    PT Duration 4 weeks    PT Treatment/Interventions ADLs/Self Care Home Management;Gait training;Stair training;Functional mobility training;Therapeutic activities;Therapeutic exercise;Balance training;Neuromuscular re-education;Manual techniques;Patient/family education    PT Next Visit Plan Progress tx focus on BLE strength and higher level dynamic balance.    Consulted and Agree with Plan of Care Patient           Patient will benefit from skilled therapeutic intervention in order to improve the following deficits and impairments:  Abnormal gait, Decreased activity tolerance, Decreased balance, Decreased coordination, Decreased mobility, Decreased endurance, Decreased range of motion, Decreased strength, Difficulty walking, Dizziness, Impaired UE functional use, Pain  Visit Diagnosis: Unspecified lack of coordination  Muscle weakness (generalized)  Other abnormalities of gait and mobility     Problem List Patient Active Problem List   Diagnosis Date Noted  . Hemiparesis affecting right side as late effect of cerebrovascular accident ([place]) 08/13/2019  . Other sleep apnea 08/06/2019  . History of kidney stones 05/02/2018  . ED (erectile dysfunction) 05/02/2018  . Hyperglycemia 01/30/2017  . Arthritis of knee, degenerative 01/30/2017  . BPH with obstruction/lower urinary tract symptoms 02/03/2016  . Gross hematuria  02/03/2016  . Dyslipidemia 08/04/2015  . Skin lesions 07/14/2015  . Diverticulitis of large intestine without perforation or abscess without bleeding 06/30/2015  . Edema 06/30/2015  . OSA on CPAP 06/30/2015  . Kidney pain 06/30/2015   Pura Spice, PT, DPT # [id number] Larna Daughters, SPT 06/26/2020, 7:23 PM  [place] [facility] [address] [place], [place], [address] Phone: [phone]   Fax:  [phone]  Name: Tom Underwood MRN: [id number] Date of Birth: 1951-03-23

## 2020-06-26 ENCOUNTER — Other Ambulatory Visit: Payer: Self-pay

## 2020-06-26 DIAGNOSIS — N400 Enlarged prostate without lower urinary tract symptoms: Secondary | ICD-10-CM

## 2020-06-28 NOTE — Progress Notes (Signed)
06/29/2020 11:41 AM   Tom Underwood 07/10/1951 [id number]  Referring provider: Alba Cory, MD [address] [place],  [place] [address]  Chief Complaint  Patient presents with  . Follow-up    1 year follow-up    HPI: Tom Underwood is 69 y.o. male with a history of hematuria, nephrolithiasis and BPH with LU TS who presents today for a one year follow up.  History of hematuria (high risk) Former smoker.  He completed a hematuria work up with a CT Urogram and cystoscopy in 2017 - no worrisome findings.  Follow up UA's negative x 3.  He has not had any gross hematuria.    Nephrolithiasis 1 mm stone in left kidney seen on 2017 CT Urogram.  Patient has a prior history of nephrolithiasis. In 2014, he spontaneously passed a left UPJ stone.  The stone composition is unknown.  No issues with stones since last visit.    BPH WITH LUTS  (prostate and/or bladder) IPSS score: 2/1  Previous score: 1/1     Major complaint(s):  Frequency which he attributes to his BP medications. Denies any dysuria, hematuria or suprapubic pain.   Denies any recent fevers, chills, nausea or vomiting.  He does not have a family history of PCa.   IPSS    Row Name 06/29/20 1100         International Prostate Symptom Score   How often have you had the sensation of not emptying your bladder? Not at All     How often have you had to urinate less than every two hours? Less than 1 in 5 times     How often have you found you stopped and started again several times when you urinated? Not at All     How often have you found it difficult to postpone urination? Not at All     How often have you had a weak urinary stream? Less than 1 in 5 times     How often have you had to strain to start urination? Not at All     How many times did you typically get up at night to urinate? None     Total IPSS Score 2       Quality of Life due to urinary symptoms   If you were to spend the rest of your  life with your urinary condition just the way it is now how would you feel about that? Pleased            Score:  1-7 Mild 8-19 Moderate 20-35 Severe  Erectile dysfunction Previous SHIM score: 5 Risk factors:  age, BPH, HTN, HLD, stoke and sleep apnea.     He has not tried the sildenafil.   Had a stroke in 07/2019  PMH: Past Medical History:  Diagnosis Date  . Allergy   . Decreased libido   . Diverticulitis   . GERD (gastroesophageal reflux disease)   . HLD (hyperlipidemia)   . Hydronephrosis with renal and ureteral calculus obstruction   . Hydronephrosis with renal and ureteral calculus obstruction   . Hypertension   . Hypogonadism in male   . Rhinitis, allergic   . Sleep apnea   . Stroke (HCC)   . Ureteral stone     Surgical History: Past Surgical History:  Procedure Laterality Date  . EXTERNAL EAR SURGERY    . FINGER SURGERY    . KNEE SURGERY    . left eye  Home Medications:  Allergies as of 06/29/2020      Reactions   Penicillins Itching      Medication List       Accurate as of June 29, 2020 11:41 AM. If you have any questions, ask your nurse or doctor.        acetaminophen 650 MG CR tablet Commonly known as: TYLENOL Take 650 mg by mouth every 8 (eight) hours as needed for pain.   amitriptyline 25 MG tablet Commonly known as: ELAVIL Take 1 tablet (25 mg total) by mouth at bedtime as needed. for sleep   aspirin 325 MG EC tablet Take 1 tablet (325 mg total) by mouth daily.   Cetirizine HCl 10 MG Caps   glucosamine-chondroitin 500-400 MG tablet Take 1 tablet by mouth 3 (three) times daily.   meloxicam 15 MG tablet Commonly known as: MOBIC Take 1 tablet (15 mg total) by mouth daily as needed.   multivitamin tablet Take 1 tablet by mouth daily.   omega-3 acid ethyl esters 1 g capsule Commonly known as: LOVAZA Take 2 capsules (2 g total) by mouth 2 (two) times daily.   pantoprazole 20 MG tablet Commonly known as: PROTONIX Take 1  tablet (20 mg total) by mouth daily.   Probiotic 250 MG Caps Take 1 capsule by mouth daily.   rosuvastatin 20 MG tablet Commonly known as: CRESTOR Take 1 tablet (20 mg total) by mouth daily.   sildenafil 20 MG tablet Commonly known as: REVATIO Take 3 to 5 tablets two hours before intercouse on an empty stomach.  Do not take with nitrates.   valsartan 80 MG tablet Commonly known as: DIOVAN Take 1 tablet (80 mg total) by mouth daily.       Allergies:  Allergies  Allergen Reactions  . Penicillins Itching    Family History: Family History  Problem Relation Age of Onset  . Asthma Mother   . Heart disease Mother   . Heart attack Mother   . Dementia Mother   . Asthma Father   . Heart disease Father   . Stroke Father     Social History:  reports that he quit smoking about 46 years ago. His smoking use included pipe. He started smoking about 47 years ago. He has never used smokeless tobacco. He reports that he does not drink alcohol and does not use drugs.  ROS: For pertinent review of systems please refer to history of present illness  Physical Exam: BP (!) 150/76   Pulse 88   Ht 5\' 10"  (1.778 m)   Wt (!) 299 lb (135.6 kg)   BMI 42.90 kg/m   Constitutional:  Well nourished. Alert and oriented, No acute distress. HEENT: [place] AT, mask in place.  Trachea midline Cardiovascular: No clubbing, cyanosis, or edema. Respiratory: Normal respiratory effort, no increased work of breathing. GI: Abdomen is soft, non tender, non distended, no abdominal masses. Liver and spleen not palpable.  No hernias appreciated.  Stool sample for occult testing is not indicated.   GU: No CVA tenderness.  No bladder fullness or masses.  Patient with uncircumcised phallus.  Foreskin easily retracted.  Smegma present.  Urethral meatus is patent.  No penile discharge. No penile lesions or rashes. Scrotum without lesions, cysts, rashes and/or edema.  Testicles are located scrotally bilaterally. No masses  are appreciated in the testicles. Left and right epididymis are normal. Rectal: Patient with  normal sphincter tone. Anus and perineum without scarring or rashes. No rectal masses are appreciated.  Prostate is approximately 50 + grams, could only palpate the apex, no nodules are appreciated. Seminal vesicles could not be palpated.  Skin: No rashes, bruises or suspicious lesions. Lymph: No inguinal adenopathy. Neurologic: Grossly intact, no focal deficits, moving all 4 extremities. Psychiatric: Normal mood and affect.   Laboratory Data: Lab Results  Component Value Date   CREATININE 0.89 05/18/2020    Lab Results  Component Value Date   TSH 2.400 08/07/2015       Component Value Date/Time   CHOL 135 05/18/2020 1126   HDL 54 05/18/2020 1126   CHOLHDL 2.5 05/18/2020 1126   LDLCALC 70 05/18/2020 1126    Lab Results  Component Value Date   AST 19 05/18/2020   Lab Results  Component Value Date   ALT 23 05/18/2020   Component     Latest Ref Rng & Units 02/02/2016 02/16/2018 06/20/2019 05/18/2020  Prostate Specific Ag, Serum     0.0 - 4.0 ng/mL 0.3 0.3 0.3 0.3  I have reviewed the labs.  Assessment & Plan:    1. BPH  I PSS score is 2/1, it is slightly worse Continue conservative management, avoiding bladder irritants and timed voiding's RTC in 12 months for IPSS, PSA and exam   2. ED Has not tried the sildenafil at this time  3. Nephrolithiasis 1 mm stone in left kidney No intervention at this time  4. History of hematuria Completed hematuria work up in 2017 - no worrisome findings No recent gross hematuria UA was negative - no further screening needed as patient has had three negative fro [facility] UA's    Return in about 1 year (around 06/29/2021) for IPSS, PSA and exam.  These notes generated with voice recognition software. I apologize for typographical errors.  Michiel Cowboy, PA-C  [facility] [address] [place],  [place] [address] [phone]

## 2020-06-29 ENCOUNTER — Ambulatory Visit (INDEPENDENT_AMBULATORY_CARE_PROVIDER_SITE_OTHER): Payer: Medicare Other | Admitting: Urology

## 2020-06-29 ENCOUNTER — Encounter: Payer: Self-pay | Admitting: Urology

## 2020-06-29 ENCOUNTER — Other Ambulatory Visit: Payer: Self-pay

## 2020-06-29 VITALS — BP 150/76 | HR 88 | Ht 70.0 in | Wt 299.0 lb

## 2020-06-29 DIAGNOSIS — N529 Male erectile dysfunction, unspecified: Secondary | ICD-10-CM

## 2020-06-29 DIAGNOSIS — Z87448 Personal history of other diseases of urinary system: Secondary | ICD-10-CM

## 2020-06-29 DIAGNOSIS — N200 Calculus of kidney: Secondary | ICD-10-CM | POA: Diagnosis not present

## 2020-06-29 DIAGNOSIS — N400 Enlarged prostate without lower urinary tract symptoms: Secondary | ICD-10-CM

## 2020-06-30 ENCOUNTER — Ambulatory Visit: Payer: Medicare Other | Admitting: Physical Therapy

## 2020-06-30 ENCOUNTER — Encounter: Payer: Self-pay | Admitting: Physical Therapy

## 2020-06-30 DIAGNOSIS — R2689 Other abnormalities of gait and mobility: Secondary | ICD-10-CM

## 2020-06-30 DIAGNOSIS — R279 Unspecified lack of coordination: Secondary | ICD-10-CM

## 2020-06-30 DIAGNOSIS — M6281 Muscle weakness (generalized): Secondary | ICD-10-CM | POA: Diagnosis not present

## 2020-06-30 NOTE — Therapy (Signed)
[facility] [address] [place], [place], [address] Phone: [phone]   Fax:  [phone]  Physical Therapy Treatment  Patient Details  Name: Tom Underwood MRN: [id number] Date of Birth: 1951/05/26 Referring Provider (PT): Jennings Books, MD   Encounter Date: 06/30/2020   PT End of Session - 06/30/20 1623    Visit Number 6    Number of Visits 51    Date for PT Re-Evaluation 07/13/20    Authorization - Visit Number 5    Authorization - Number of Visits 10    PT Start Time 8453    PT Stop Time 1508    PT Time Calculation (min) 44 min    Equipment Utilized During Treatment Gait belt    Activity Tolerance Patient tolerated treatment well;Patient limited by pain    Behavior During Therapy [facility] for tasks assessed/performed           Past Medical History:  Diagnosis Date  . Allergy   . Decreased libido   . Diverticulitis   . GERD (gastroesophageal reflux disease)   . HLD (hyperlipidemia)   . Hydronephrosis with renal and ureteral calculus obstruction   . Hydronephrosis with renal and ureteral calculus obstruction   . Hypertension   . Hypogonadism in male   . Rhinitis, allergic   . Sleep apnea   . Stroke ([place])   . Ureteral stone     Past Surgical History:  Procedure Laterality Date  . EXTERNAL EAR SURGERY    . FINGER SURGERY    . KNEE SURGERY    . left eye      There were no vitals filed for this visit.   Subjective Assessment - 06/30/20 1621    Subjective Increased pain in R shoulder rated at 7/10 NPS. Pt reports walking at home. Soreness from previous sessions in BLE's.    Pertinent History Pt. had a right sided stroke and loves to watch football and travel to the beach with his wife.    Limitations Walking;Standing    How long can you walk comfortably? 0.2 miles before heaviness/fatigue    Patient Stated Goals Increase walking distance, carry things with less pain in shoulder    Currently in Pain? Yes     Pain Score 7     Pain Location Shoulder    Pain Orientation Right    Pain Descriptors / Indicators Aching;Discomfort    Pain Type Chronic pain    Pain Onset More than a month ago    Multiple Pain Sites No         There.ex:   Nu-Step L5 for 10 min. Use of UE's/LE's. Able to consistently meet 0.6 miles   Lateral 6" step-ups on stairs: R/L 2x12. Require mod verbal and tactile cues to maintain form/technique to reduce compensations for lat hip weakness   B mini lunges in // bars: 2x8  STS with 6 lbs DB in B hands: 1x15   Pt required 2-3 min seated rest breaks between all sets of exercise.   PT Long Term Goals - 06/15/20 1148      PT LONG TERM GOAL #1   Title Pt. will be able to ambulate with least assistive device for 0.8 mile with good gait mechanics in home community to improve walking endurance.    Baseline Pt. reports he can walk 0.2 mile in his mobile home community.  3/25: increase walking endurance but not to baseline. 6/8: <0.5 miles; 06/15/2020: unable to walk beyond .2  miles at home. Reports ambulating beyond a quarter mile when in mountains on vacation.    Time 4    Period Weeks    Status Partially Met    Target Date 07/13/20      PT LONG TERM GOAL #2   Title Pt. will improve FOTO score to > 57 to show improvements in LE function for ADLs    Baseline 41.  3/22: 44.   6/8: 45 (limited with heavy household tasks/ increase walking); 06/15/2020: 58    Time 0    Period Weeks    Status Achieved    Target Date 06/15/20      PT LONG TERM GOAL #3   Title Pt. will increase R LE strength to grossly 5/5 to improve gait mechanics and LE strength for ADLs    Baseline R hip flexion 4-/5, knee flexion 4+/5, ankle everison 4+/5.  3/25:  hip flexion 4/5 MMT.  5/10: 4/5 MMT.  Limited B shoulder MMT due to pain.   6/8: see clinical impression; 06/15/2020: 4+/5 for R hip flexion, knee flexion, and quad.    Time 0    Period Weeks    Status Partially Met    Target Date 07/13/20      PT  LONG TERM GOAL #4   Title Pt. will increase bilat UE flexion/abduction to 120 deg. for functional mobility for overhead reaching and bathing.    Baseline R Flexion 79 with pain in deltoid, R Abduction 108; L flexion 131, L Abduction 109.  2/23: R sh. flexion >100 deg. (pain limited).  3/25:  R sh. flexion 134 deg./ abduction 114 deg.  L sh. flexion 130 deg./ abduction 128 deg. (consistent improvement).    Time 4    Period Weeks    Status Not Met    Target Date 07/13/20      PT LONG TERM GOAL #5   Title Pt. able to ambulate outside on grassy terrain with consistent gait pattern and no assistive device safely.    Baseline Pt. requires use of SPC for safety with gait.    Time 0    Period Weeks    Status Achieved    Target Date 07/13/20                 Plan - 06/30/20 2033    Clinical Impression Statement Due to increase in resistance exercise for BLE's pt fatiguing quickly in legs requiring frequent seated rest breaks lasting 2-3 minutes. With lateral step-ups pt requires mod verbal cuing for form to reduce turning forward onto step to compensate for lateral hip weakness. Pt able to perform B lunges in // bars without BUE support on // bars but unable to lunge beyond 10-15 degrees of knee flexion. Pt can continue to benefit from skilled pT to improve BLE strength to improve community walking distances.    Personal Factors and Comorbidities Comorbidity 2    Comorbidities Hypertension, Obesity    Examination-Activity Limitations Bed Mobility;Reach Overhead;Squat;Lift    Examination-Participation Restrictions Community Activity;Yard Work    [employer]chant [employer] officer Evolving/Moderate complexity    Rehab Potential Good    PT Frequency 2x / week    PT Duration 4 weeks    PT Treatment/Interventions ADLs/Self Care Home Management;Gait training;Stair training;Functional mobility training;Therapeutic activities;Therapeutic exercise;Balance training;Neuromuscular re-education;Manual  techniques;Patient/family education    PT Next Visit Plan Progress tx focus on BLE strength and higher level dynamic balance.    Consulted and Agree with Plan of Care Patient  Patient will benefit from skilled therapeutic intervention in order to improve the following deficits and impairments:  Abnormal gait, Decreased activity tolerance, Decreased balance, Decreased coordination, Decreased mobility, Decreased endurance, Decreased range of motion, Decreased strength, Difficulty walking, Dizziness, Impaired UE functional use, Pain  Visit Diagnosis: Unspecified lack of coordination  Muscle weakness (generalized)  Other abnormalities of gait and mobility     Problem List Patient Active Problem List   Diagnosis Date Noted  . Hemiparesis affecting right side as late effect of cerebrovascular accident ([place]) 08/13/2019  . Other sleep apnea 08/06/2019  . History of kidney stones 05/02/2018  . ED (erectile dysfunction) 05/02/2018  . Hyperglycemia 01/30/2017  . Arthritis of knee, degenerative 01/30/2017  . BPH with obstruction/lower urinary tract symptoms 02/03/2016  . Gross hematuria 02/03/2016  . Dyslipidemia 08/04/2015  . Skin lesions 07/14/2015  . Diverticulitis of large intestine without perforation or abscess without bleeding 06/30/2015  . Edema 06/30/2015  . OSA on CPAP 06/30/2015  . Kidney pain 06/30/2015   Pura Spice, PT, DPT [address], SPT 07/01/2020, 7:14 AM  [facility] [address] [place], [place], [address] Phone: [phone]   Fax:  [phone]  Name: Tom Underwood MRN: [id number] Date of Birth: [birth year]

## 2020-07-02 ENCOUNTER — Other Ambulatory Visit: Payer: Self-pay

## 2020-07-02 ENCOUNTER — Ambulatory Visit: Payer: Medicare Other

## 2020-07-02 DIAGNOSIS — R279 Unspecified lack of coordination: Secondary | ICD-10-CM

## 2020-07-02 DIAGNOSIS — M6281 Muscle weakness (generalized): Secondary | ICD-10-CM

## 2020-07-02 DIAGNOSIS — R2689 Other abnormalities of gait and mobility: Secondary | ICD-10-CM

## 2020-07-02 NOTE — Therapy (Signed)
[facility] [address]. [place], [place], [address] Phone: [phone]   Fax:  [phone]  Physical Therapy Treatment  Patient Details  Name: Tom Underwood MRN: [id number] Date of Birth: Nov 07, 1951 Referring Provider (PT): Jennings Books, MD   Encounter Date: 07/02/2020   PT End of Session - 07/02/20 1039    Visit Number 48    Number of Visits 51    Date for PT Re-Evaluation 07/13/20    Authorization - Visit Number 6    Authorization - Number of Visits 10    PT Start Time 0925    PT Stop Time 1030    PT Time Calculation (min) 65 min    Equipment Utilized During Treatment Gait belt    Activity Tolerance Patient tolerated treatment well;Patient limited by pain    Behavior During Therapy [facility] for tasks assessed/performed           Past Medical History:  Diagnosis Date  . Allergy   . Decreased libido   . Diverticulitis   . GERD (gastroesophageal reflux disease)   . HLD (hyperlipidemia)   . Hydronephrosis with renal and ureteral calculus obstruction   . Hydronephrosis with renal and ureteral calculus obstruction   . Hypertension   . Hypogonadism in male   . Rhinitis, allergic   . Sleep apnea   . Stroke ([place])   . Ureteral stone     Past Surgical History:  Procedure Laterality Date  . EXTERNAL EAR SURGERY    . FINGER SURGERY    . KNEE SURGERY    . left eye      There were no vitals filed for this visit.   Subjective Assessment - 07/02/20 0935    Subjective Pt states his R shoulder pain is 8-9/10 NPS.  He is getting an MRI on Monday to assess that.  He has no new complaints related to his LEs.  His R LE feels heavy/weak from his stroke last year.    Pertinent History Pt. had a right sided stroke and loves to watch football and travel to the beach with his wife.    Limitations Walking;Standing    How long can you walk comfortably? 0.2 miles before heaviness/fatigue    Patient Stated Goals Increase walking  distance, carry things with less pain in shoulder    Currently in Pain? Yes    Pain Score 9     Pain Location Shoulder    Pain Onset More than a month ago          Therapeutic Exercises:  Nu-Step L5 for 10 min. Use of UE's/LE's.  Focused on RPM goal for endurance during exercise- achieved .55 miles (without using R UE handle today)  Lateral 6" step-ups on stairs: R/L 2x12. Require mod verbal and tactile cues to maintain form/technique to reduce compensations for lat hip weakness    B mini lunges in // bars: 2x10 B lateral mini lunge in // bars: x10 ea side   STS with 6 lbs DB in B hands: 1x15   Balance Training x 10 min: with PT SBA in hallway today- amb with head turns up/down, pause to stop in front of cone and alternate toe taps x5 ea side, cont amb with head turns R/L, alternate toe taps x5 ea; also practiced standing high knee marches in place, amb to R and L around cone in a small space   Pt rest breaks 2-3 min between sets of exercises.  PT Education - 07/02/20 1039    Education Details exercise form/technique during session today    Person(s) Educated Patient    Methods Explanation;Demonstration    Comprehension Verbalized understanding;Verbal cues required               PT Long Term Goals - 06/15/20 1148      PT LONG TERM GOAL #1   Title Pt. will be able to ambulate with least assistive device for 0.8 mile with good gait mechanics in home community to improve walking endurance.    Baseline Pt. reports he can walk 0.2 mile in his mobile home community.  3/25: increase walking endurance but not to baseline. 6/8: <0.5 miles; 06/15/2020: unable to walk beyond .2 miles at home. Reports ambulating beyond a quarter mile when in mountains on vacation.    Time 4    Period Weeks    Status Partially Met    Target Date 07/13/20      PT LONG TERM GOAL #2   Title Pt. will improve FOTO score to > 57 to show improvements in LE function for ADLs    Baseline 41.   3/22: 44.   6/8: 45 (limited with heavy household tasks/ increase walking); 06/15/2020: 58    Time 0    Period Weeks    Status Achieved    Target Date 06/15/20      PT LONG TERM GOAL #3   Title Pt. will increase R LE strength to grossly 5/5 to improve gait mechanics and LE strength for ADLs    Baseline R hip flexion 4-/5, knee flexion 4+/5, ankle everison 4+/5.  3/25:  hip flexion 4/5 MMT.  5/10: 4/5 MMT.  Limited B shoulder MMT due to pain.   6/8: see clinical impression; 06/15/2020: 4+/5 for R hip flexion, knee flexion, and quad.    Time 0    Period Weeks    Status Partially Met    Target Date 07/13/20      PT LONG TERM GOAL #4   Title Pt. will increase bilat UE flexion/abduction to 120 deg. for functional mobility for overhead reaching and bathing.    Baseline R Flexion 79 with pain in deltoid, R Abduction 108; L flexion 131, L Abduction 109.  2/23: R sh. flexion >100 deg. (pain limited).  3/25:  R sh. flexion 134 deg./ abduction 114 deg.  L sh. flexion 130 deg./ abduction 128 deg. (consistent improvement).    Time 4    Period Weeks    Status Not Met    Target Date 07/13/20      PT LONG TERM GOAL #5   Title Pt. able to ambulate outside on grassy terrain with consistent gait pattern and no assistive device safely.    Baseline Pt. requires use of SPC for safety with gait.    Time 0    Period Weeks    Status Achieved    Target Date 07/13/20                 Plan - 07/02/20 1040    Clinical Impression Statement Pt was able to tolerate progression of LE strengthening today with increased resistance/reps on sit to stand, and with addition of lateral lunges into program.  He fatigues quickly and requires rest breaks inbetween each exercise.  Overall, with rest breaks the pace of today's session was an appropriate challenge as he reports mod fatigue at end.  He should cont to benefit from skilled PT to improve b/l LE strength  and balance to functionally improve community walking  distances.    Personal Factors and Comorbidities Comorbidity 2    Comorbidities Hypertension, Obesity    Examination-Activity Limitations Bed Mobility;Reach Overhead;Squat;Lift    Examination-Participation Restrictions Community Activity;Yard Work    [employer]chant [employer] officer Evolving/Moderate complexity    Rehab Potential Good    PT Frequency 2x / week    PT Duration 4 weeks    PT Treatment/Interventions ADLs/Self Care Home Management;Gait training;Stair training;Functional mobility training;Therapeutic activities;Therapeutic exercise;Balance training;Neuromuscular re-education;Manual techniques;Patient/family education    PT Next Visit Plan Progress tx focus on BLE strength and higher level dynamic balance.    Consulted and Agree with Plan of Care Patient           Patient will benefit from skilled therapeutic intervention in order to improve the following deficits and impairments:  Abnormal gait, Decreased activity tolerance, Decreased balance, Decreased coordination, Decreased mobility, Decreased endurance, Decreased range of motion, Decreased strength, Difficulty walking, Dizziness, Impaired UE functional use, Pain  Visit Diagnosis: Unspecified lack of coordination  Muscle weakness (generalized)  Other abnormalities of gait and mobility     Problem List Patient Active Problem List   Diagnosis Date Noted  . Hemiparesis affecting right side as late effect of cerebrovascular accident ([place]) 08/13/2019  . Other sleep apnea 08/06/2019  . History of kidney stones 05/02/2018  . ED (erectile dysfunction) 05/02/2018  . Hyperglycemia 01/30/2017  . Arthritis of knee, degenerative 01/30/2017  . BPH with obstruction/lower urinary tract symptoms 02/03/2016  . Gross hematuria 02/03/2016  . Dyslipidemia 08/04/2015  . Skin lesions 07/14/2015  . Diverticulitis of large intestine without perforation or abscess without bleeding 06/30/2015  . Edema 06/30/2015  . OSA on CPAP  06/30/2015  . Kidney pain 06/30/2015    Pincus Badder 07/02/2020, 10:45 AM Merdis Delay, PT, DPT Physical Therapist - [place]   [facility] [address] [place], [place], [address] Phone: [phone]   Fax:  [phone]  Name: Tom Underwood MRN: [id number] Date of Birth: 1951-07-28

## 2020-07-07 ENCOUNTER — Ambulatory Visit: Payer: Medicare Other | Attending: Neurology | Admitting: Physical Therapy

## 2020-07-07 ENCOUNTER — Other Ambulatory Visit: Payer: Self-pay

## 2020-07-07 ENCOUNTER — Encounter: Payer: Self-pay | Admitting: Physical Therapy

## 2020-07-07 DIAGNOSIS — R279 Unspecified lack of coordination: Secondary | ICD-10-CM | POA: Insufficient documentation

## 2020-07-07 DIAGNOSIS — M25511 Pain in right shoulder: Secondary | ICD-10-CM | POA: Diagnosis not present

## 2020-07-07 DIAGNOSIS — Z8673 Personal history of transient ischemic attack (TIA), and cerebral infarction without residual deficits: Secondary | ICD-10-CM | POA: Diagnosis not present

## 2020-07-07 DIAGNOSIS — M7501 Adhesive capsulitis of right shoulder: Secondary | ICD-10-CM | POA: Diagnosis not present

## 2020-07-07 DIAGNOSIS — R2689 Other abnormalities of gait and mobility: Secondary | ICD-10-CM | POA: Insufficient documentation

## 2020-07-07 DIAGNOSIS — M19011 Primary osteoarthritis, right shoulder: Secondary | ICD-10-CM | POA: Diagnosis not present

## 2020-07-07 DIAGNOSIS — M25611 Stiffness of right shoulder, not elsewhere classified: Secondary | ICD-10-CM | POA: Diagnosis not present

## 2020-07-07 DIAGNOSIS — M75111 Incomplete rotator cuff tear or rupture of right shoulder, not specified as traumatic: Secondary | ICD-10-CM | POA: Diagnosis not present

## 2020-07-07 DIAGNOSIS — M6281 Muscle weakness (generalized): Secondary | ICD-10-CM | POA: Diagnosis not present

## 2020-07-07 DIAGNOSIS — M7502 Adhesive capsulitis of left shoulder: Secondary | ICD-10-CM | POA: Diagnosis not present

## 2020-07-07 DIAGNOSIS — G8929 Other chronic pain: Secondary | ICD-10-CM | POA: Diagnosis not present

## 2020-07-07 NOTE — Therapy (Signed)
[place] [facility] [address] [place], [place], [address] Phone: [phone]   Fax:  [phone]  Physical Therapy Treatment  Patient Details  Name: Tom Underwood MRN: [id number] Date of Birth: 09/01/1951 Referring Provider (PT): Jennings Books, MD   Encounter Date: 07/07/2020   PT End of Session - 07/07/20 1147    Visit Number 65    Number of Visits 51    Date for PT Re-Evaluation 07/13/20    Authorization - Visit Number 7    Authorization - Number of Visits 10    PT Start Time 5638    PT Stop Time 1202    PT Time Calculation (min) 48 min    Equipment Utilized During Treatment Gait belt    Activity Tolerance Patient tolerated treatment well;Patient limited by pain    Behavior During Therapy [facility] [facility] for tasks assessed/performed           Past Medical History:  Diagnosis Date  . Allergy   . Decreased libido   . Diverticulitis   . GERD (gastroesophageal reflux disease)   . HLD (hyperlipidemia)   . Hydronephrosis with renal and ureteral calculus obstruction   . Hydronephrosis with renal and ureteral calculus obstruction   . Hypertension   . Hypogonadism in male   . Rhinitis, allergic   . Sleep apnea   . Stroke ([place])   . Ureteral stone     Past Surgical History:  Procedure Laterality Date  . EXTERNAL EAR SURGERY    . FINGER SURGERY    . KNEE SURGERY    . left eye      There were no vitals filed for this visit.   Subjective Assessment - 07/07/20 1115    Subjective Pt reports going to [facility] for R shoulder MRI today. Pt reports 5-6/10 NPS in R shoulder. Mild BLE soreness from previous session.    Pertinent History Pt. had a right sided stroke and loves to watch football and travel to the beach with his wife.    Limitations Walking;Standing    How long can you walk comfortably? 0.2 miles before heaviness/fatigue    Patient Stated Goals Increase walking distance, carry things with less pain in shoulder    Currently in  Pain? Yes    Pain Score 6     Pain Location Shoulder    Pain Orientation Right    Pain Descriptors / Indicators Aching    Pain Type Chronic pain    Pain Onset More than a month ago    Pain Frequency Intermittent    Multiple Pain Sites No          There.ex:   Nu-Step L5 for 10 min with use of UE's/LE's.  Neuro Re-Ed:  Walking over blue mat (ankle weights underneath) as unstable surface with no AD: x4 Walking over blue mat as unstable surface (ankle weights underneath) with weaving: x4  Walking over blue mat as unstable surface (ankle weights underneath) stepping over hurdles with SPC: x4 Walking with 5 lbs ankle weights outside taking 1 lap around the building. Focus on reciprocal gait pattern and equal step length bilaterally with consistent heel strike. Focus also on pt's endurance for ambulating community distances.      PT Long Term Goals - 06/15/20 1148      PT LONG TERM GOAL #1   Title Pt. will be able to ambulate with least assistive device for 0.8 mile with good gait mechanics in home community to improve walking endurance.  Baseline Pt. reports he can walk 0.2 mile in his mobile home community.  3/25: increase walking endurance but not to baseline. 6/8: <0.5 miles; 06/15/2020: unable to walk beyond .2 miles at home. Reports ambulating beyond a quarter mile [place].    Time 4    Period Weeks    Status Partially Met    Target Date 07/13/20      PT LONG TERM GOAL #2   Title Pt. will improve FOTO score to > 57 to show improvements in LE function for ADLs    Baseline 41.  3/22: 44.   6/8: 45 (limited with heavy household tasks/ increase walking); 06/15/2020: 58    Time 0    Period Weeks    Status Achieved    Target Date 06/15/20      PT LONG TERM GOAL #3   Title Pt. will increase R LE strength to grossly 5/5 to improve gait mechanics and LE strength for ADLs    Baseline R hip flexion 4-/5, knee flexion 4+/5, ankle everison 4+/5.  3/25:  hip  flexion 4/5 MMT.  5/10: 4/5 MMT.  Limited B shoulder MMT due to pain.   6/8: see clinical impression; 06/15/2020: 4+/5 for R hip flexion, knee flexion, and quad.    Time 0    Period Weeks    Status Partially Met    Target Date 07/13/20      PT LONG TERM GOAL #4   Title Pt. will increase bilat UE flexion/abduction to 120 deg. for functional mobility for overhead reaching and bathing.    Baseline R Flexion 79 with pain in deltoid, R Abduction 108; L flexion 131, L Abduction 109.  2/23: R sh. flexion >100 deg. (pain limited).  3/25:  R sh. flexion 134 deg./ abduction 114 deg.  L sh. flexion 130 deg./ abduction 128 deg. (consistent improvement).    Time 4    Period Weeks    Status Not Met    Target Date 07/13/20      PT LONG TERM GOAL #5   Title Pt. able to ambulate outside on grassy terrain with consistent gait pattern and no assistive device safely.    Baseline Pt. requires use of SPC for safety with gait.    Time 0    Period Weeks    Status Achieved    Target Date 07/13/20                 Plan - 07/07/20 1148    Clinical Impression Statement Pt able to tolerate dynamic balance and gait tasks with 5 lbs ankle weights Bilaterally with only single seated rest breaks. Pt improving in ability to take equal step lengths and heel strike with 5 lbs ankle weights to normalize gait mechanics to improve pt's ability to more efficienlty ambulate in a manner that is more energy efficient. Pt can continue to benefit form skiled PT treatment to improve BLE strength and functional mobility.    Personal Factors and Comorbidities Comorbidity 2    Comorbidities Hypertension, Obesity    Examination-Activity Limitations Bed Mobility;Reach Overhead;Squat;Lift    Examination-Participation Restrictions Community Activity;Yard Work    [employer]chant [employer] officer Evolving/Moderate complexity    Rehab Potential Good    PT Frequency 2x / week    PT Duration 4 weeks    PT Treatment/Interventions  ADLs/Self Care Home Management;Gait training;Stair training;Functional mobility training;Therapeutic activities;Therapeutic exercise;Balance training;Neuromuscular re-education;Manual techniques;Patient/family education    PT Next Visit Plan Progress tx focus on BLE  strength and higher level dynamic balance.    Consulted and Agree with Plan of Care Patient           Patient will benefit from skilled therapeutic intervention in order to improve the following deficits and impairments:  Abnormal gait, Decreased activity tolerance, Decreased balance, Decreased coordination, Decreased mobility, Decreased endurance, Decreased range of motion, Decreased strength, Difficulty walking, Dizziness, Impaired UE functional use, Pain  Visit Diagnosis: Unspecified lack of coordination  Muscle weakness (generalized)  Other abnormalities of gait and mobility     Problem List Patient Active Problem List   Diagnosis Date Noted  . Hemiparesis affecting right side as late effect of cerebrovascular accident ([place]) 08/13/2019  . Other sleep apnea 08/06/2019  . History of kidney stones 05/02/2018  . ED (erectile dysfunction) 05/02/2018  . Hyperglycemia 01/30/2017  . Arthritis of knee, degenerative 01/30/2017  . BPH with obstruction/lower urinary tract symptoms 02/03/2016  . Gross hematuria 02/03/2016  . Dyslipidemia 08/04/2015  . Skin lesions 07/14/2015  . Diverticulitis of large intestine without perforation or abscess without bleeding 06/30/2015  . Edema 06/30/2015  . OSA on CPAP 06/30/2015  . Kidney pain 06/30/2015   Pura Spice, PT, DPT # [id number] Larna Daughters, SPT 07/07/2020, 6:12 PM  [place] [facility] [address] [place], [place], [address] Phone: [phone]   Fax:  [phone]  Name: Tom Underwood MRN: [id number] Date of Birth: [birth year]

## 2020-07-09 ENCOUNTER — Ambulatory Visit: Payer: Medicare Other | Admitting: Physical Therapy

## 2020-07-09 ENCOUNTER — Other Ambulatory Visit: Payer: Self-pay

## 2020-07-09 ENCOUNTER — Encounter: Payer: Self-pay | Admitting: Physical Therapy

## 2020-07-09 DIAGNOSIS — M7502 Adhesive capsulitis of left shoulder: Secondary | ICD-10-CM | POA: Diagnosis not present

## 2020-07-09 DIAGNOSIS — M25611 Stiffness of right shoulder, not elsewhere classified: Secondary | ICD-10-CM | POA: Diagnosis not present

## 2020-07-09 DIAGNOSIS — M6281 Muscle weakness (generalized): Secondary | ICD-10-CM | POA: Diagnosis not present

## 2020-07-09 DIAGNOSIS — R2689 Other abnormalities of gait and mobility: Secondary | ICD-10-CM | POA: Diagnosis not present

## 2020-07-09 DIAGNOSIS — R279 Unspecified lack of coordination: Secondary | ICD-10-CM

## 2020-07-09 DIAGNOSIS — M7501 Adhesive capsulitis of right shoulder: Secondary | ICD-10-CM | POA: Diagnosis not present

## 2020-07-09 NOTE — Therapy (Signed)
[place] [facility] [address] [place], [place], [address] Phone: [phone]   Fax:  [phone]  Physical Therapy Treatment  Patient Details  Name: Tom Underwood MRN: [id number] Date of Birth: 11-Jul-1951 Referring Provider (PT): Jennings Books, MD   Encounter Date: 07/09/2020   PT End of Session - 07/09/20 1119    Visit Number 50    Number of Visits 51    Date for PT Re-Evaluation 07/13/20    Authorization - Visit Number 8    Authorization - Number of Visits 10    PT Start Time 1116    PT Stop Time 1200    PT Time Calculation (min) 44 min    Equipment Utilized During Treatment Gait belt    Activity Tolerance Patient tolerated treatment well;Patient limited by pain    Behavior During Therapy WFL for tasks assessed/performed           Past Medical History:  Diagnosis Date   Allergy    Decreased libido    Diverticulitis    GERD (gastroesophageal reflux disease)    HLD (hyperlipidemia)    Hydronephrosis with renal and ureteral calculus obstruction    Hydronephrosis with renal and ureteral calculus obstruction    Hypertension    Hypogonadism in male    Rhinitis, allergic    Sleep apnea    Stroke [facility])    Ureteral stone     Past Surgical History:  Procedure Laterality Date   EXTERNAL EAR SURGERY     FINGER SURGERY     KNEE SURGERY     left eye      There were no vitals filed for this visit.   Subjective Assessment - 07/09/20 1118    Subjective Pt reports MD follow up visit for R shoulder MRI tomorrow morning. R shoulder pain rated at 4/10 NPS. No pain with BLE's. Tiredness reported in BLE's after previous session.    Pertinent History Pt. had a right sided stroke and loves to watch football and travel to the beach with his wife.    Limitations Walking;Standing    How long can you walk comfortably? 0.2 miles before heaviness/fatigue    Patient Stated Goals Increase walking distance, carry things with  less pain in shoulder    Currently in Pain? Yes    Pain Score 4     Pain Location Shoulder    Pain Orientation Right    Pain Descriptors / Indicators Sharp    Pain Type Chronic pain    Pain Onset More than a month ago    Multiple Pain Sites No          There.ex:  Nu-Step L5 for 10 min. Use of UE's/LE's.  6" side-Steps with 5 lbs ankle weights with B handrails on stair case: R/L 2x8, B forward steps, 1x8 STS: 1x12 Backwards walking with 5 lbs ankle weights in hallway (14') with no AD: x2  Side lunges in / / bars: B, 1x10     PT Long Term Goals - 06/15/20 1148      PT LONG TERM GOAL #1   Title Pt. will be able to ambulate with least assistive device for 0.8 mile with good gait mechanics in home community to improve walking endurance.    Baseline Pt. reports he can walk 0.2 mile in his mobile home community.  3/25: increase walking endurance but not to baseline. 6/8: <0.5 miles; 06/15/2020: unable to walk beyond .2 miles at home. Reports ambulating beyond a  quarter mile when in [place] on vacation.    Time 4    Period Weeks    Status Partially Met    Target Date 07/13/20      PT LONG TERM GOAL #2   Title Pt. will improve FOTO score to > 57 to show improvements in LE function for ADLs    Baseline 41.  3/22: 44.   6/8: 45 (limited with heavy household tasks/ increase walking); 06/15/2020: 58    Time 0    Period Weeks    Status Achieved    Target Date 06/15/20      PT LONG TERM GOAL #3   Title Pt. will increase R LE strength to grossly 5/5 to improve gait mechanics and LE strength for ADLs    Baseline R hip flexion 4-/5, knee flexion 4+/5, ankle everison 4+/5.  3/25:  hip flexion 4/5 MMT.  5/10: 4/5 MMT.  Limited B shoulder MMT due to pain.   6/8: see clinical impression; 06/15/2020: 4+/5 for R hip flexion, knee flexion, and quad.    Time 0    Period Weeks    Status Partially Met    Target Date 07/13/20      PT LONG TERM GOAL #4   Title Pt. will increase bilat UE  flexion/abduction to 120 deg. for functional mobility for overhead reaching and bathing.    Baseline R Flexion 79 with pain in deltoid, R Abduction 108; L flexion 131, L Abduction 109.  2/23: R sh. flexion >100 deg. (pain limited).  3/25:  R sh. flexion 134 deg./ abduction 114 deg.  L sh. flexion 130 deg./ abduction 128 deg. (consistent improvement).    Time 4    Period Weeks    Status Not Met    Target Date 07/13/20      PT LONG TERM GOAL #5   Title Pt. able to ambulate outside on grassy terrain with consistent gait pattern and no assistive device safely.    Baseline Pt. requires use of SPC for safety with gait.    Time 0    Period Weeks    Status Achieved    Target Date 07/13/20            Plan - 07/09/20 1200    Clinical Impression Statement Pt able to perform Side Steps and ambulating in hallway backwards without use of SPC with 5 lbs ankle weights. Pt displayed no LOB and no seated rest breaks between sets. With side steps pt able to improve his tolerance to 8 reps compared to 5 reps previously displaying improvements in B hip and quad strength. Pt can continue to benefit from skilled PT to improve functional mobility and endurance.    Personal Factors and Comorbidities Comorbidity 2    Comorbidities Hypertension, Obesity    Examination-Activity Limitations Bed Mobility;Reach Overhead;Squat;Lift    Examination-Participation Restrictions Community Activity;Yard Work    Merchant [employer] officer Evolving/Moderate complexity    Rehab Potential Good    PT Frequency 2x / week    PT Duration 4 weeks    PT Treatment/Interventions ADLs/Self Care Home Management;Gait training;Stair training;Functional mobility training;Therapeutic activities;Therapeutic exercise;Balance training;Neuromuscular re-education;Manual techniques;Patient/family education    PT Next Visit Plan Progress tx focus on BLE strength and higher level dynamic balance.    Consulted and Agree with Plan of Care  Patient           Patient will benefit from skilled therapeutic intervention in order to improve the following deficits and impairments:  Abnormal gait, Decreased  activity tolerance, Decreased balance, Decreased coordination, Decreased mobility, Decreased endurance, Decreased range of motion, Decreased strength, Difficulty walking, Dizziness, Impaired UE functional use, Pain  Visit Diagnosis: Unspecified lack of coordination  Muscle weakness (generalized)  Other abnormalities of gait and mobility     Problem List Patient Active Problem List   Diagnosis Date Noted   Hemiparesis affecting right side as late effect of cerebrovascular accident ([place]) 08/13/2019   Other sleep apnea 08/06/2019   History of kidney stones 05/02/2018   ED (erectile dysfunction) 05/02/2018   Hyperglycemia 01/30/2017   Arthritis of knee, degenerative 01/30/2017   BPH with obstruction/lower urinary tract symptoms 02/03/2016   Gross hematuria 02/03/2016   Dyslipidemia 08/04/2015   Skin lesions 07/14/2015   Diverticulitis of large intestine without perforation or abscess without bleeding 06/30/2015   Edema 06/30/2015   OSA on CPAP 06/30/2015   Kidney pain 06/30/2015   Pura Spice, PT, DPT # [id number] Larna Daughters, SPT 07/09/2020, 3:38 PM  [place] [facility] [address] [place], [place], [address] Phone: [phone]   Fax:  [phone]  Name: HERMES WAFER MRN: [id number] Date of Birth: 1951-11-19

## 2020-07-10 DIAGNOSIS — M7502 Adhesive capsulitis of left shoulder: Secondary | ICD-10-CM | POA: Diagnosis not present

## 2020-07-10 DIAGNOSIS — M25511 Pain in right shoulder: Secondary | ICD-10-CM | POA: Diagnosis not present

## 2020-07-10 DIAGNOSIS — M7501 Adhesive capsulitis of right shoulder: Secondary | ICD-10-CM | POA: Diagnosis not present

## 2020-07-14 ENCOUNTER — Emergency Department: Payer: Medicare Other

## 2020-07-14 ENCOUNTER — Encounter: Payer: Self-pay | Admitting: Physical Therapy

## 2020-07-14 ENCOUNTER — Encounter: Payer: Self-pay | Admitting: Emergency Medicine

## 2020-07-14 ENCOUNTER — Other Ambulatory Visit: Payer: Self-pay

## 2020-07-14 ENCOUNTER — Ambulatory Visit: Payer: Medicare Other | Admitting: Physical Therapy

## 2020-07-14 ENCOUNTER — Inpatient Hospital Stay
Admission: EM | Admit: 2020-07-14 | Discharge: 2020-07-17 | DRG: 281 | Disposition: A | Discharge status: Home / Self Care | Payer: Medicare Other | Attending: Internal Medicine | Admitting: Internal Medicine

## 2020-07-14 DIAGNOSIS — Z79899 Other long term (current) drug therapy: Secondary | ICD-10-CM | POA: Diagnosis not present

## 2020-07-14 DIAGNOSIS — I480 Paroxysmal atrial fibrillation: Secondary | ICD-10-CM | POA: Diagnosis not present

## 2020-07-14 DIAGNOSIS — R7989 Other specified abnormal findings of blood chemistry: Secondary | ICD-10-CM

## 2020-07-14 DIAGNOSIS — M542 Cervicalgia: Secondary | ICD-10-CM | POA: Diagnosis not present

## 2020-07-14 DIAGNOSIS — N401 Enlarged prostate with lower urinary tract symptoms: Secondary | ICD-10-CM | POA: Diagnosis present

## 2020-07-14 DIAGNOSIS — E876 Hypokalemia: Secondary | ICD-10-CM | POA: Diagnosis present

## 2020-07-14 DIAGNOSIS — I4819 Other persistent atrial fibrillation: Secondary | ICD-10-CM | POA: Diagnosis present

## 2020-07-14 DIAGNOSIS — N138 Other obstructive and reflux uropathy: Secondary | ICD-10-CM | POA: Diagnosis present

## 2020-07-14 DIAGNOSIS — Z791 Long term (current) use of non-steroidal anti-inflammatories (NSAID): Secondary | ICD-10-CM

## 2020-07-14 DIAGNOSIS — K219 Gastro-esophageal reflux disease without esophagitis: Secondary | ICD-10-CM | POA: Diagnosis present

## 2020-07-14 DIAGNOSIS — I10 Essential (primary) hypertension: Secondary | ICD-10-CM | POA: Diagnosis present

## 2020-07-14 DIAGNOSIS — Z20822 Contact with and (suspected) exposure to covid-19: Secondary | ICD-10-CM | POA: Diagnosis present

## 2020-07-14 DIAGNOSIS — M7502 Adhesive capsulitis of left shoulder: Secondary | ICD-10-CM

## 2020-07-14 DIAGNOSIS — M7501 Adhesive capsulitis of right shoulder: Secondary | ICD-10-CM

## 2020-07-14 DIAGNOSIS — I4891 Unspecified atrial fibrillation: Secondary | ICD-10-CM

## 2020-07-14 DIAGNOSIS — G4733 Obstructive sleep apnea (adult) (pediatric): Secondary | ICD-10-CM | POA: Diagnosis present

## 2020-07-14 DIAGNOSIS — R519 Headache, unspecified: Secondary | ICD-10-CM | POA: Diagnosis not present

## 2020-07-14 DIAGNOSIS — G8929 Other chronic pain: Secondary | ICD-10-CM

## 2020-07-14 DIAGNOSIS — Z823 Family history of stroke: Secondary | ICD-10-CM

## 2020-07-14 DIAGNOSIS — F419 Anxiety disorder, unspecified: Secondary | ICD-10-CM | POA: Diagnosis present

## 2020-07-14 DIAGNOSIS — Z87442 Personal history of urinary calculi: Secondary | ICD-10-CM

## 2020-07-14 DIAGNOSIS — S0990XA Unspecified injury of head, initial encounter: Secondary | ICD-10-CM | POA: Diagnosis not present

## 2020-07-14 DIAGNOSIS — S3993XA Unspecified injury of pelvis, initial encounter: Secondary | ICD-10-CM | POA: Diagnosis not present

## 2020-07-14 DIAGNOSIS — Z23 Encounter for immunization: Secondary | ICD-10-CM | POA: Diagnosis not present

## 2020-07-14 DIAGNOSIS — S299XXA Unspecified injury of thorax, initial encounter: Secondary | ICD-10-CM | POA: Diagnosis not present

## 2020-07-14 DIAGNOSIS — Z87891 Personal history of nicotine dependence: Secondary | ICD-10-CM | POA: Diagnosis not present

## 2020-07-14 DIAGNOSIS — R000 Tachycardia, unspecified: Secondary | ICD-10-CM | POA: Diagnosis not present

## 2020-07-14 DIAGNOSIS — Z9989 Dependence on other enabling machines and devices: Secondary | ICD-10-CM | POA: Diagnosis not present

## 2020-07-14 DIAGNOSIS — Z825 Family history of asthma and other chronic lower respiratory diseases: Secondary | ICD-10-CM | POA: Diagnosis not present

## 2020-07-14 DIAGNOSIS — Z880 Allergy status to penicillin: Secondary | ICD-10-CM

## 2020-07-14 DIAGNOSIS — Z7982 Long term (current) use of aspirin: Secondary | ICD-10-CM

## 2020-07-14 DIAGNOSIS — Z8673 Personal history of transient ischemic attack (TIA), and cerebral infarction without residual deficits: Secondary | ICD-10-CM | POA: Diagnosis not present

## 2020-07-14 DIAGNOSIS — Y92410 Unspecified street and highway as the place of occurrence of the external cause: Secondary | ICD-10-CM | POA: Diagnosis not present

## 2020-07-14 DIAGNOSIS — Z6841 Body Mass Index (BMI) 40.0 and over, adult: Secondary | ICD-10-CM | POA: Diagnosis not present

## 2020-07-14 DIAGNOSIS — R778 Other specified abnormalities of plasma proteins: Secondary | ICD-10-CM | POA: Diagnosis not present

## 2020-07-14 DIAGNOSIS — S199XXA Unspecified injury of neck, initial encounter: Secondary | ICD-10-CM | POA: Diagnosis not present

## 2020-07-14 DIAGNOSIS — I69351 Hemiplegia and hemiparesis following cerebral infarction affecting right dominant side: Secondary | ICD-10-CM

## 2020-07-14 DIAGNOSIS — E785 Hyperlipidemia, unspecified: Secondary | ICD-10-CM | POA: Diagnosis present

## 2020-07-14 DIAGNOSIS — Z8249 Family history of ischemic heart disease and other diseases of the circulatory system: Secondary | ICD-10-CM | POA: Diagnosis not present

## 2020-07-14 DIAGNOSIS — N400 Enlarged prostate without lower urinary tract symptoms: Secondary | ICD-10-CM | POA: Diagnosis present

## 2020-07-14 DIAGNOSIS — R600 Localized edema: Secondary | ICD-10-CM | POA: Diagnosis present

## 2020-07-14 DIAGNOSIS — I214 Non-ST elevation (NSTEMI) myocardial infarction: Secondary | ICD-10-CM

## 2020-07-14 DIAGNOSIS — R0902 Hypoxemia: Secondary | ICD-10-CM | POA: Diagnosis not present

## 2020-07-14 DIAGNOSIS — R531 Weakness: Secondary | ICD-10-CM | POA: Diagnosis present

## 2020-07-14 DIAGNOSIS — M25611 Stiffness of right shoulder, not elsewhere classified: Secondary | ICD-10-CM

## 2020-07-14 LAB — COMPREHENSIVE METABOLIC PANEL
ALT: 26 U/L (ref 0–44)
AST: 20 U/L (ref 15–41)
Albumin: 4.2 g/dL (ref 3.5–5.0)
Alkaline Phosphatase: 90 U/L (ref 38–126)
Anion gap: 12 (ref 5–15)
BUN: 14 mg/dL (ref 8–23)
CO2: 22 mmol/L (ref 22–32)
Calcium: 9.3 mg/dL (ref 8.9–10.3)
Chloride: 105 mmol/L (ref 98–111)
Creatinine, Ser: 0.84 mg/dL (ref 0.61–1.24)
GFR calc Af Amer: >60 mL/min (ref >60)
GFR calc non Af Amer: >60 mL/min (ref >60)
Glucose, Bld: 102 mg/dL — ABNORMAL HIGH (ref 70–99)
Potassium: 3.4 mmol/L — ABNORMAL LOW (ref 3.5–5.1)
Sodium: 139 mmol/L (ref 135–145)
Total Bilirubin: 1.8 mg/dL — ABNORMAL HIGH (ref 0.3–1.2)
Total Protein: 7.8 g/dL (ref 6.5–8.1)

## 2020-07-14 LAB — CBC
HCT: 44.7 % (ref 39.0–52.0)
Hemoglobin: 15.8 g/dL (ref 13.0–17.0)
MCH: 31.2 pg (ref 26.0–34.0)
MCHC: 35.3 g/dL (ref 30.0–36.0)
MCV: 88.3 fL (ref 80.0–100.0)
Platelets: 253 10*3/uL (ref 150–400)
RBC: 5.06 MIL/uL (ref 4.22–5.81)
RDW: 12.9 % (ref 11.5–15.5)
WBC: 12.1 10*3/uL — ABNORMAL HIGH (ref 4.0–10.5)
nRBC: 0 % (ref 0.0–0.2)

## 2020-07-14 LAB — SAMPLE TO BLOOD BANK

## 2020-07-14 LAB — PROTIME-INR
INR: 1 (ref 0.8–1.2)
Prothrombin Time: 13.2 seconds (ref 11.4–15.2)

## 2020-07-14 LAB — LACTIC ACID, PLASMA: Lactic Acid, Venous: 1 mmol/L (ref 0.5–1.9)

## 2020-07-14 LAB — MAGNESIUM: Magnesium: 2 mg/dL (ref 1.7–2.4)

## 2020-07-14 LAB — ETHANOL: Alcohol, Ethyl (B): <10 mg/dL (ref <10)

## 2020-07-14 LAB — TSH: TSH: 2.311 u[IU]/mL (ref 0.350–4.500)

## 2020-07-14 LAB — SARS CORONAVIRUS 2 BY RT PCR (HOSPITAL ORDER, PERFORMED IN ~~LOC~~ HOSPITAL LAB): SARS Coronavirus 2: NEGATIVE

## 2020-07-14 LAB — TROPONIN I (HIGH SENSITIVITY)
Troponin I (High Sensitivity): 32 ng/L — ABNORMAL HIGH (ref <18)
Troponin I (High Sensitivity): 90 ng/L — ABNORMAL HIGH (ref <18)

## 2020-07-14 MED ORDER — METOPROLOL TARTRATE 25 MG PO TABS
25.0000 mg | ORAL_TABLET | Freq: Two times a day (BID) | ORAL | Status: DC
  Administered 2020-07-15 (×2): 25 mg via ORAL
  Filled 2020-07-14 (×4): qty 1

## 2020-07-14 MED ORDER — TETANUS-DIPHTH-ACELL PERTUSSIS 5-2.5-18.5 LF-MCG/0.5 IM SUSP
0.5000 mL | Freq: Once | INTRAMUSCULAR | Status: AC
  Administered 2020-07-14: 0.5 mL via INTRAMUSCULAR
  Filled 2020-07-14: qty 0.5

## 2020-07-14 MED ORDER — ACETAMINOPHEN 325 MG PO TABS
650.0000 mg | ORAL_TABLET | ORAL | Status: DC | PRN
  Administered 2020-07-15: 650 mg via ORAL
  Filled 2020-07-14: qty 2

## 2020-07-14 MED ORDER — POTASSIUM CHLORIDE CRYS ER 20 MEQ PO TBCR
40.0000 meq | EXTENDED_RELEASE_TABLET | Freq: Once | ORAL | Status: AC
  Administered 2020-07-14: 40 meq via ORAL
  Filled 2020-07-14: qty 2

## 2020-07-14 MED ORDER — METOPROLOL TARTRATE 5 MG/5ML IV SOLN
2.5000 mg | INTRAVENOUS | Status: DC | PRN
  Administered 2020-07-15: 2.5 mg via INTRAVENOUS
  Filled 2020-07-14: qty 5

## 2020-07-14 MED ORDER — METOPROLOL TARTRATE 25 MG PO TABS: 25.0000 mg | ORAL_TABLET | Freq: Two times a day (BID) | ORAL | 11 refills | Status: DC

## 2020-07-14 MED ORDER — ROSUVASTATIN CALCIUM 10 MG PO TABS
20.0000 mg | ORAL_TABLET | Freq: Every day | ORAL | Status: DC
  Administered 2020-07-15 – 2020-07-16 (×3): 20 mg via ORAL
  Filled 2020-07-14: qty 1
  Filled 2020-07-14 (×2): qty 2
  Filled 2020-07-14: qty 1

## 2020-07-14 MED ORDER — ACETAMINOPHEN 500 MG PO TABS
1000.0000 mg | ORAL_TABLET | Freq: Once | ORAL | Status: AC
  Administered 2020-07-14: 1000 mg via ORAL
  Filled 2020-07-14: qty 2

## 2020-07-14 MED ORDER — PANTOPRAZOLE SODIUM 20 MG PO TBEC
20.0000 mg | DELAYED_RELEASE_TABLET | Freq: Every day | ORAL | Status: DC
  Administered 2020-07-15 – 2020-07-17 (×3): 20 mg via ORAL
  Filled 2020-07-14 (×3): qty 1

## 2020-07-14 MED ORDER — METOPROLOL TARTRATE 25 MG PO TABS
25.0000 mg | ORAL_TABLET | Freq: Once | ORAL | Status: AC
  Administered 2020-07-14: 25 mg via ORAL
  Filled 2020-07-14: qty 1

## 2020-07-14 MED ORDER — ASPIRIN EC 325 MG PO TBEC
325.0000 mg | DELAYED_RELEASE_TABLET | Freq: Every day | ORAL | Status: DC
  Administered 2020-07-15: 325 mg via ORAL
  Filled 2020-07-14 (×2): qty 1

## 2020-07-14 MED ORDER — ONDANSETRON HCL 4 MG/2ML IJ SOLN: 4.0000 mg | Freq: Four times a day (QID) | INTRAMUSCULAR | Status: DC | PRN

## 2020-07-14 MED ORDER — METOPROLOL TARTRATE 5 MG/5ML IV SOLN
5.0000 mg | INTRAVENOUS | Status: DC | PRN
  Administered 2020-07-14: 5 mg via INTRAVENOUS
  Filled 2020-07-14: qty 5

## 2020-07-14 MED ORDER — APIXABAN 5 MG PO TABS
5.0000 mg | ORAL_TABLET | Freq: Two times a day (BID) | ORAL | Status: DC
  Administered 2020-07-15 – 2020-07-17 (×6): 5 mg via ORAL
  Filled 2020-07-14 (×7): qty 1

## 2020-07-14 MED ORDER — LORATADINE 10 MG PO TABS
10.0000 mg | ORAL_TABLET | Freq: Every day | ORAL | Status: DC
  Administered 2020-07-15 – 2020-07-17 (×3): 10 mg via ORAL
  Filled 2020-07-14 (×5): qty 1

## 2020-07-14 MED ORDER — LACTATED RINGERS IV BOLUS
500.0000 mL | Freq: Once | INTRAVENOUS | Status: AC
  Administered 2020-07-14: 500 mL via INTRAVENOUS

## 2020-07-14 MED ORDER — IRBESARTAN 150 MG PO TABS
75.0000 mg | ORAL_TABLET | Freq: Every day | ORAL | Status: DC
  Administered 2020-07-15: 75 mg via ORAL
  Filled 2020-07-14 (×2): qty 1

## 2020-07-14 NOTE — ED Notes (Signed)
Report given to Paige RN.
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
## 2020-07-14 NOTE — ED Notes (Signed)
Webb Silversmith NP informed of troponin value of 90.
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
## 2020-07-14 NOTE — ED Notes (Signed)
Call placed to hospitalist about troponin.
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
## 2020-07-14 NOTE — Patient Instructions (Signed)
Access Code: [email or web address]: [email or web address]: 08/10/2021Prepared by: Casimiro Needle SherkExercises  Seated Shoulder Flexion AAROM with Pulley Behind - 2 x daily - 7 x weekly - 1 sets - 20 reps  Seated Shoulder Scaption AAROM with Pulley at Side - 2 x daily - 7 x weekly - 1 sets - 20 reps  Seated Shoulder Abduction AAROM with Pulley Behind - 2 x daily - 7 x weekly - 1 sets - 20 reps
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
## 2020-07-15 ENCOUNTER — Inpatient Hospital Stay (HOSPITAL_COMMUNITY)
Admit: 2020-07-15 | Discharge: 2020-07-15 | Disposition: A | Discharge status: Home / Self Care | Payer: Medicare Other | Attending: Cardiology | Admitting: Cardiology

## 2020-07-15 DIAGNOSIS — I4891 Unspecified atrial fibrillation: Secondary | ICD-10-CM | POA: Diagnosis not present

## 2020-07-15 DIAGNOSIS — I10 Essential (primary) hypertension: Secondary | ICD-10-CM | POA: Diagnosis not present

## 2020-07-15 DIAGNOSIS — Z8673 Personal history of transient ischemic attack (TIA), and cerebral infarction without residual deficits: Secondary | ICD-10-CM

## 2020-07-15 DIAGNOSIS — I480 Paroxysmal atrial fibrillation: Principal | ICD-10-CM

## 2020-07-15 LAB — BRAIN NATRIURETIC PEPTIDE: B Natriuretic Peptide: 160.8 pg/mL — ABNORMAL HIGH (ref 0.0–100.0)

## 2020-07-15 LAB — HIV ANTIBODY (ROUTINE TESTING W REFLEX): HIV Screen 4th Generation wRfx: NONREACTIVE

## 2020-07-15 LAB — TSH: TSH: 2.986 u[IU]/mL (ref 0.350–4.500)

## 2020-07-15 MED ORDER — ACETAMINOPHEN 500 MG PO TABS
1000.0000 mg | ORAL_TABLET | Freq: Four times a day (QID) | ORAL | Status: DC | PRN
  Administered 2020-07-15 – 2020-07-16 (×2): 1000 mg via ORAL
  Filled 2020-07-15 (×2): qty 2

## 2020-07-15 MED ORDER — PERFLUTREN LIPID MICROSPHERE
1.0000 mL | INTRAVENOUS | Status: AC | PRN
  Administered 2020-07-15: 2 mL via INTRAVENOUS
  Filled 2020-07-15: qty 10

## 2020-07-15 MED ORDER — OMEGA-3-ACID ETHYL ESTERS 1 G PO CAPS
2.0000 g | ORAL_CAPSULE | Freq: Two times a day (BID) | ORAL | Status: DC
  Administered 2020-07-15 – 2020-07-17 (×4): 2 g via ORAL
  Filled 2020-07-15 (×4): qty 2

## 2020-07-15 MED ORDER — ASPIRIN EC 81 MG PO TBEC
81.0000 mg | DELAYED_RELEASE_TABLET | Freq: Every day | ORAL | Status: DC
  Filled 2020-07-15: qty 1

## 2020-07-15 MED ORDER — ACETAMINOPHEN 325 MG PO TABS: 650.0000 mg | ORAL_TABLET | Freq: Four times a day (QID) | ORAL | Status: DC | PRN
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
## 2020-07-15 NOTE — ED Notes (Signed)
Pt assisted with use of urinal. Alert and calm at this time. Very talkative. No distress.

## 2020-07-15 NOTE — ED Notes (Signed)
Hospitalist to bedside.

## 2020-07-15 NOTE — Progress Notes (Signed)
*  PRELIMINARY RESULTS* Echocardiogram 2D Echocardiogram has been performed. Definity IV Contrast used on this study.  Garrel Ridgel Edwinna Rochette 07/15/2020, 7:18 PM

## 2020-07-15 NOTE — ED Notes (Signed)
Pt transported to room 255 
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
## 2020-07-15 NOTE — ED Notes (Signed)
Lunch tray provided. 

## 2020-07-15 NOTE — ED Notes (Signed)
Pt sitting up on stretcher alert; smiling and talkative. No acute distress noted at this time. Monitor and blood pressure cuff in place. Will continue to assess.

## 2020-07-16 ENCOUNTER — Ambulatory Visit: Payer: Medicare Other | Admitting: Physical Therapy

## 2020-07-16 DIAGNOSIS — E785 Hyperlipidemia, unspecified: Secondary | ICD-10-CM | POA: Diagnosis not present

## 2020-07-16 DIAGNOSIS — I10 Essential (primary) hypertension: Secondary | ICD-10-CM | POA: Diagnosis not present

## 2020-07-16 DIAGNOSIS — I4891 Unspecified atrial fibrillation: Secondary | ICD-10-CM | POA: Diagnosis not present

## 2020-07-16 LAB — BASIC METABOLIC PANEL
Anion gap: 12 (ref 5–15)
BUN: 19 mg/dL (ref 8–23)
CO2: 23 mmol/L (ref 22–32)
Calcium: 9 mg/dL (ref 8.9–10.3)
Chloride: 103 mmol/L (ref 98–111)
Creatinine, Ser: 1.04 mg/dL (ref 0.61–1.24)
GFR calc Af Amer: >60 mL/min (ref >60)
GFR calc non Af Amer: >60 mL/min (ref >60)
Glucose, Bld: 118 mg/dL — ABNORMAL HIGH (ref 70–99)
Potassium: 3.5 mmol/L (ref 3.5–5.1)
Sodium: 138 mmol/L (ref 135–145)

## 2020-07-16 LAB — ECHOCARDIOGRAM COMPLETE
AV Peak grad: 5.1 mmHg
Ao pk vel: 1.13 m/s
Area-P 1/2: 4.21 cm2
Height: 70 in
S' Lateral: 2.97 cm
Weight: 4742.4 oz

## 2020-07-16 LAB — CBC
HCT: 44.8 % (ref 39.0–52.0)
Hemoglobin: 15.5 g/dL (ref 13.0–17.0)
MCH: 30.6 pg (ref 26.0–34.0)
MCHC: 34.6 g/dL (ref 30.0–36.0)
MCV: 88.5 fL (ref 80.0–100.0)
Platelets: 258 10*3/uL (ref 150–400)
RBC: 5.06 MIL/uL (ref 4.22–5.81)
RDW: 13 % (ref 11.5–15.5)
WBC: 9.1 10*3/uL (ref 4.0–10.5)
nRBC: 0 % (ref 0.0–0.2)

## 2020-07-16 MED ORDER — POTASSIUM CHLORIDE CRYS ER 20 MEQ PO TBCR
40.0000 meq | EXTENDED_RELEASE_TABLET | Freq: Once | ORAL | Status: AC
  Administered 2020-07-16: 40 meq via ORAL
  Filled 2020-07-16: qty 2

## 2020-07-16 MED ORDER — METOPROLOL TARTRATE 50 MG PO TABS
50.0000 mg | ORAL_TABLET | Freq: Two times a day (BID) | ORAL | Status: DC
  Administered 2020-07-16 – 2020-07-17 (×3): 50 mg via ORAL
  Filled 2020-07-16 (×3): qty 1
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
## 2020-07-16 NOTE — Progress Notes (Signed)
Mobility Specialist - Progress Note   07/16/20 1534  Mobility  Activity Ambulated in room;Ambulated in hall  Assistive Device Cane [facility])  Distance Ambulated (ft) 220 ft  Mobility Response Tolerated well  Mobility performed by Mobility specialist  $Mobility charge 1 Mobility    Pre-mobility: 70 HR, 125/81 BP, 95% SpO2 During mobility: 91 HR, 95% SpO2 Post-mobility: 72 HR, 142/85 BP, 97% SpO2   Pt was in bed upon arrival. Pt agreed to mobility session. Pt stated he was having pain in his shoulders. Pt rated pain ~2-4/10. However, shoulder pain does not limit ability to ambulate. Pt mod. Independent getting to EOB and S2S. Pt ambulated 220' total in room and hallway w/ SBA using a SPC. CGA utilized for safety precautions. Pt showed signs of heavy breathing. Pt denied SOB. Overall, pt tolerated session well. Pt left sitting EOB w/ tray placed in front of him. Call bell and phone were placed in reach. Nurse was notified.     Jericca Russett Mobility Specialist  07/16/20, 3:39 PM

## 2020-07-16 NOTE — Care Management Important Message (Signed)
Important Message  Patient Details  Name: Tom Underwood MRN: [id number] Date of Birth: 1951/08/08   Medicare Important Message Given:  Yes   Initial Medicare IM given by Patient Access Associate on 07/16/2020 at 10:29am.     [name] 07/16/2020, 2:39 PM
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
## 2020-07-17 DIAGNOSIS — I4891 Unspecified atrial fibrillation: Secondary | ICD-10-CM | POA: Diagnosis not present

## 2020-07-17 DIAGNOSIS — I10 Essential (primary) hypertension: Secondary | ICD-10-CM | POA: Diagnosis not present

## 2020-07-17 DIAGNOSIS — E785 Hyperlipidemia, unspecified: Secondary | ICD-10-CM | POA: Diagnosis not present

## 2020-07-17 LAB — CBC
HCT: 43.3 % (ref 39.0–52.0)
Hemoglobin: 15.4 g/dL (ref 13.0–17.0)
MCH: 31 pg (ref 26.0–34.0)
MCHC: 35.6 g/dL (ref 30.0–36.0)
MCV: 87.1 fL (ref 80.0–100.0)
Platelets: 275 10*3/uL (ref 150–400)
RBC: 4.97 MIL/uL (ref 4.22–5.81)
RDW: 12.7 % (ref 11.5–15.5)
WBC: 7.9 10*3/uL (ref 4.0–10.5)
nRBC: 0 % (ref 0.0–0.2)

## 2020-07-17 LAB — BASIC METABOLIC PANEL
Anion gap: 10 (ref 5–15)
BUN: 17 mg/dL (ref 8–23)
CO2: 23 mmol/L (ref 22–32)
Calcium: 9 mg/dL (ref 8.9–10.3)
Chloride: 105 mmol/L (ref 98–111)
Creatinine, Ser: 0.87 mg/dL (ref 0.61–1.24)
GFR calc Af Amer: >60 mL/min (ref >60)
GFR calc non Af Amer: >60 mL/min (ref >60)
Glucose, Bld: 124 mg/dL — ABNORMAL HIGH (ref 70–99)
Potassium: 3.6 mmol/L (ref 3.5–5.1)
Sodium: 138 mmol/L (ref 135–145)

## 2020-07-17 MED ORDER — METOPROLOL TARTRATE 50 MG PO TABS: 50.0000 mg | ORAL_TABLET | Freq: Two times a day (BID) | ORAL | 0 refills | Status: DC

## 2020-07-17 MED ORDER — IRBESARTAN 75 MG PO TABS
75.0000 mg | ORAL_TABLET | Freq: Every day | ORAL | Status: DC
  Administered 2020-07-17: 75 mg via ORAL
  Filled 2020-07-17: qty 1

## 2020-07-17 MED ORDER — APIXABAN 5 MG PO TABS: 5.0000 mg | ORAL_TABLET | Freq: Two times a day (BID) | ORAL | 0 refills | Status: DC
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
## 2020-07-17 NOTE — Progress Notes (Signed)
Pt refused cpap
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
## 2020-07-20 ENCOUNTER — Ambulatory Visit: Payer: Medicare Other

## 2020-07-20 DIAGNOSIS — I4891 Unspecified atrial fibrillation: Secondary | ICD-10-CM

## 2020-07-21 ENCOUNTER — Telehealth: Payer: Self-pay

## 2020-07-21 ENCOUNTER — Ambulatory Visit: Payer: Medicare Other | Admitting: Physical Therapy

## 2020-07-21 NOTE — Telephone Encounter (Signed)
07/21/20 Spoke with patient's wife Felipe Paluch about transportation arrangements for 8/25 and 8/31 appointments. Let [name] know patient should expect a call from [place] at [employer] and a call from [employer].  Spoke with Marcelino Duster at [employer] patient's transportation is set-up for 08/04/20 appointment. There is no availability for 07/29/20 appt.  Sent transportation request to [employer] for 07/29/20 appt.  Olean Ree [phone]
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
## 2020-07-22 NOTE — Telephone Encounter (Signed)
07/22/20 Spoke with patient about transportation arrangements through [employer] for 07/29/20 appt and [employer] for 08/04/20 appt.  Gave patient contact information to call [employer] for future appointments.  Spoke with Frederic Jericho with [employer] they have received request and will contact patient this week. I will be emailed a confirmation once request is processed. Olean Ree [phone]

## 2020-07-23 ENCOUNTER — Ambulatory Visit: Payer: Medicare Other | Admitting: Physical Therapy

## 2020-07-28 ENCOUNTER — Telehealth: Payer: Self-pay | Admitting: Family Medicine

## 2020-07-28 ENCOUNTER — Telehealth: Payer: Self-pay

## 2020-07-28 NOTE — Telephone Encounter (Signed)
Copied from CRM [id number]. Topic: General - Call Back - No Documentation >> Jul 28, 2020 12:20 PM Reuben Likes D wrote: Reason for CRM: Patient is requesting to speak to [facility] regarding transportation. Patient needs to know what time he is being picked up. Please call patient back with resolution.

## 2020-07-28 NOTE — Telephone Encounter (Signed)
   Tom Underwood DOB: 1951/05/17 MRN: [id number]   RIDER WAIVER AND RELEASE OF LIABILITY  For purposes of improving physical access to our facilities, [facility] is pleased to partner with third parties to provide [facility] patients or other authorized individuals the option of convenient, on-demand ground transportation services (the [employer]") through use of the technology service that enables users to request on-demand ground transportation from independent third-party providers.  By opting to use and accept these [employer], I, the undersigned, hereby agree on behalf of myself, and on behalf of any minor child using the [employer] for whom I am the parent or legal guardian, as follows:  1. Science writer provided to me are provided by independent third-party transportation providers who are not [employer] or employees and who are unaffiliated with [employer]. 2. [facility] is neither a transportation carrier nor a common or public carrier. 3. [facility] has no control over the quality or safety of the transportation that occurs as a result of the [employer]. 4. [facility] cannot guarantee that any third-party transportation provider will complete any arranged transportation service. 5. [facility] makes no representation, warranty, or guarantee regarding the reliability, timeliness, quality, safety, suitability, or availability of any of the Transport Services or that they will be error free. 6. I fully understand that traveling by vehicle involves risks and dangers of serious bodily injury, including permanent disability, paralysis, and death. I agree, on behalf of myself and on behalf of any minor child using the Transport Services for whom I am the parent or legal guardian, that the entire risk arising out of my use of the [employer] remains solely with me, to the maximum extent permitted under applicable law. 7. The [employer] are provided "as is" and "as available." [facility] disclaims all representations and warranties, express, implied or statutory, not expressly set out in these terms, including the implied warranties of merchantability and fitness for a particular purpose. 8. I hereby waive and release [facility], its agents, employees, officers, directors, representatives, insurers, attorneys, assigns, successors, subsidiaries, and affiliates from any and all past, present, or future claims, demands, liabilities, actions, causes of action, or suits of any kind directly or indirectly arising from acceptance and use of the [employer]. 9. I further waive and release [facility] and its affiliates from all present and future liability and responsibility for any injury or death to persons or damages to property caused by or related to the use of the [employer]. 10. I have read this Waiver and Release of Liability, and I understand the terms used in it and their legal significance. This Waiver is freely and voluntarily given with the understanding that my right (as well as the right of any minor child for whom I am the parent or legal guardian using the [employer]) to legal recourse against  in connection with the [employer] is knowingly surrendered in return for use of these services.   I attest that I read the consent document to Tom Underwood, gave Mr. Catoe the opportunity to ask questions and answered the questions asked (if any). I affirm that Tom Underwood then provided consent for he's participation in this program.     Launa Grill

## 2020-07-28 NOTE — Telephone Encounter (Signed)
Pt is stating that he must get in contact with Felicia today Re his transportation needs for his heart dr. Canary Brim at [phone]

## 2020-07-29 ENCOUNTER — Ambulatory Visit (INDEPENDENT_AMBULATORY_CARE_PROVIDER_SITE_OTHER): Payer: Medicare Other | Admitting: Family

## 2020-07-29 ENCOUNTER — Encounter: Payer: Self-pay | Admitting: Family

## 2020-07-29 ENCOUNTER — Other Ambulatory Visit: Payer: Self-pay

## 2020-07-29 VITALS — BP 150/60 | HR 66 | Ht 70.0 in | Wt 290.5 lb

## 2020-07-29 DIAGNOSIS — M7501 Adhesive capsulitis of right shoulder: Secondary | ICD-10-CM | POA: Diagnosis not present

## 2020-07-29 DIAGNOSIS — E785 Hyperlipidemia, unspecified: Secondary | ICD-10-CM

## 2020-07-29 DIAGNOSIS — G4733 Obstructive sleep apnea (adult) (pediatric): Secondary | ICD-10-CM

## 2020-07-29 DIAGNOSIS — Z8673 Personal history of transient ischemic attack (TIA), and cerebral infarction without residual deficits: Secondary | ICD-10-CM

## 2020-07-29 DIAGNOSIS — I10 Essential (primary) hypertension: Secondary | ICD-10-CM | POA: Diagnosis not present

## 2020-07-29 DIAGNOSIS — I480 Paroxysmal atrial fibrillation: Secondary | ICD-10-CM

## 2020-07-29 DIAGNOSIS — Z7901 Long term (current) use of anticoagulants: Secondary | ICD-10-CM

## 2020-07-29 DIAGNOSIS — M7502 Adhesive capsulitis of left shoulder: Secondary | ICD-10-CM | POA: Diagnosis not present

## 2020-07-29 MED ORDER — METOPROLOL TARTRATE 50 MG PO TABS: 50.0000 mg | ORAL_TABLET | Freq: Two times a day (BID) | ORAL | 2 refills | Status: DC

## 2020-07-29 MED ORDER — VALSARTAN 80 MG PO TABS: 80.0000 mg | ORAL_TABLET | Freq: Every day | ORAL | 1 refills | Status: DC

## 2020-07-29 MED ORDER — APIXABAN 5 MG PO TABS: 5.0000 mg | ORAL_TABLET | Freq: Two times a day (BID) | ORAL | 2 refills | Status: DC

## 2020-07-29 NOTE — Patient Instructions (Addendum)
Medication Instructions:  Your physician has recommended you make the following change in your medication:   RESUME Valsartan 80 mg daily for blood pressure control  *If you need a refill on your cardiac medications before your next appointment, please call your pharmacy*  Lab Work: No lab work today.   If you have labs (blood work) drawn today and your tests are completely normal, you will receive your results only by: Marland Kitchen MyChart Message (if you have MyChart) OR . A paper copy in the mail If you have any lab test that is abnormal or we need to change your treatment, we will call you to review the results.  Testing/Procedures: Your EKG today shows that you are maintaining normal sinus rhythm.  Your echocardiogram in the hospital showed your heart pumps nice and strong and that your heart valves were normal.   Follow-Up: At [facility], you and your health needs are our priority.  As part of our continuing mission to provide you with exceptional heart care, we have created designated Provider Care Teams.  These Care Teams include your primary Cardiologist (physician) and Advanced Practice Providers (APPs -  Physician Assistants and Nurse Practitioners) who all work together to provide you with the care you need, when you need it.  We recommend signing up for the patient portal called "MyChart".  Sign up information is provided on this After Visit Summary.  MyChart is used to connect with patients for Virtual Visits (Telemedicine).  Patients are able to view lab/test results, encounter notes, upcoming appointments, etc.  Non-urgent messages can be sent to your provider as well.   To learn more about what you can do with MyChart, go to [email or web address].    Your next appointment:   2 month(s)  The format for your next appointment:   In Person  Provider:   You may see Debbe Odea, MD or one of the following Advanced Practice Providers on your designated Care Team:     Nicolasa Ducking, NP  Eula Listen, PA-C  Marisue Ivan, PA-C  Gillian Shields, NP  Other Instructions  Our goal is for your blood pressure to be consistently less than 130/80.   Kardia for monitoring of EKGs at home:  You can look into the [facility] device by [employer] if you're interested in being to check your EKG at home.This device is purchased by you and it connects to an application you download to your smart phone.  It can detect abnormal heart rhythms and alert you to contact your doctor for further evaluation. The device is approximately $90 and the phone application is free.  The web site is:  [email or web address].[email or web address].[email or web address]
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
## 2020-08-04 ENCOUNTER — Ambulatory Visit: Payer: Medicare Other | Admitting: Physical Therapy

## 2020-08-04 ENCOUNTER — Other Ambulatory Visit: Payer: Self-pay

## 2020-08-04 ENCOUNTER — Ambulatory Visit (INDEPENDENT_AMBULATORY_CARE_PROVIDER_SITE_OTHER): Payer: Medicare Other | Admitting: Internal Medicine

## 2020-08-04 ENCOUNTER — Encounter: Payer: Self-pay | Admitting: Internal Medicine

## 2020-08-04 VITALS — BP 122/68 | HR 83 | Temp 98.3°F | Resp 16 | Ht 70.0 in | Wt 286.5 lb

## 2020-08-04 DIAGNOSIS — Z09 Encounter for follow-up examination after completed treatment for conditions other than malignant neoplasm: Secondary | ICD-10-CM | POA: Diagnosis not present

## 2020-08-04 DIAGNOSIS — I10 Essential (primary) hypertension: Secondary | ICD-10-CM | POA: Diagnosis not present

## 2020-08-04 DIAGNOSIS — I4891 Unspecified atrial fibrillation: Secondary | ICD-10-CM

## 2020-08-04 DIAGNOSIS — E785 Hyperlipidemia, unspecified: Secondary | ICD-10-CM

## 2020-08-04 DIAGNOSIS — I69351 Hemiplegia and hemiparesis following cerebral infarction affecting right dominant side: Secondary | ICD-10-CM

## 2020-08-04 DIAGNOSIS — G4733 Obstructive sleep apnea (adult) (pediatric): Secondary | ICD-10-CM

## 2020-08-04 DIAGNOSIS — F341 Dysthymic disorder: Secondary | ICD-10-CM | POA: Diagnosis not present

## 2020-08-05 ENCOUNTER — Other Ambulatory Visit: Payer: Self-pay

## 2020-08-05 ENCOUNTER — Telehealth: Payer: Self-pay | Admitting: Family

## 2020-08-05 MED ORDER — RIVAROXABAN 20 MG PO TABS: 20.0000 mg | ORAL_TABLET | Freq: Every day | ORAL | 2 refills | Status: DC

## 2020-08-05 NOTE — Telephone Encounter (Signed)
-----   Message from Andi Devon sent at 08/05/2020  2:59 PM EDT ----- Regarding: blood thinner Rx sent to pharmacy. Patient informed of medication change and expressed verbal understanding. ----- Message ----- From: Alver Sorrow, NP Sent: 08/05/2020   2:44 PM EDT To: Andi Devon   Tom Underwood was recently seen after hospitalization with newly noted atrial fibrillation.  He was started on Eliquis 5 mg twice daily time of discharge.  When seen in clinic he was maintaining sinus rhythm, recommended to continue anticoagulation due to CHA2DS2-VASc of 4.  He was provided with paperwork for patient assistance for Eliquis.  Received notice from pharmacy that his insurance prefers Xarelto or Warfarin. He has a creatinine clearance of 112.  Would be appropriate to stop Eliquis 5 mg twice daily and transition to Xarelto 20 mg daily with dinner. Xarelto is very similar to Eliquis in that it is a new generation of blood thinners, does not require significant dietary changes like Warfarin.   Alver Sorrow, NP

## 2020-08-05 NOTE — Telephone Encounter (Signed)
    Outreach Note   Name: KEITHON MCCOIN MRN: [id number] DOB: [birth year]  Primary Care Provider: Alba Cory, MD    Message received from clinical team regarding pending transportation for Mr. Guevara. Contacted Mr. Tan to relay message from the [employer] team. He was informed that his transportation request was approved.  Informed that a member of the [facility] transportation team will contact him by 6pm today to confirm his pick-up time for his Cardiology appointment tomorrow.  Follow-up: Mr. Lotts called back and confirmed outreach with [employer]. No further concerns. Agreed to call if additional assistance is needed.   [facility] Care Management [facility] [phone]

## 2020-08-06 ENCOUNTER — Other Ambulatory Visit: Payer: Self-pay

## 2020-08-06 ENCOUNTER — Encounter: Payer: Self-pay | Admitting: Physical Therapy

## 2020-08-06 ENCOUNTER — Ambulatory Visit: Payer: Medicare Other | Attending: Neurology | Admitting: Physical Therapy

## 2020-08-06 DIAGNOSIS — R2689 Other abnormalities of gait and mobility: Secondary | ICD-10-CM | POA: Diagnosis not present

## 2020-08-06 DIAGNOSIS — R279 Unspecified lack of coordination: Secondary | ICD-10-CM | POA: Diagnosis not present

## 2020-08-06 DIAGNOSIS — G8929 Other chronic pain: Secondary | ICD-10-CM | POA: Insufficient documentation

## 2020-08-06 DIAGNOSIS — M25511 Pain in right shoulder: Secondary | ICD-10-CM | POA: Insufficient documentation

## 2020-08-06 DIAGNOSIS — M6281 Muscle weakness (generalized): Secondary | ICD-10-CM | POA: Insufficient documentation

## 2020-08-06 DIAGNOSIS — M25611 Stiffness of right shoulder, not elsewhere classified: Secondary | ICD-10-CM | POA: Diagnosis not present

## 2020-08-06 DIAGNOSIS — M7502 Adhesive capsulitis of left shoulder: Secondary | ICD-10-CM | POA: Diagnosis not present

## 2020-08-06 DIAGNOSIS — M7501 Adhesive capsulitis of right shoulder: Secondary | ICD-10-CM | POA: Diagnosis not present
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
## 2020-08-06 NOTE — Patient Instructions (Signed)
Access Code: [email or web address]: [email or web address]: 09/02/2021Prepared by: Faylene Kurtz  Circular Shoulder Pendulum with Table Support - 1 x daily - 7 x weekly - 3 sets - 10 reps  Seated Shoulder Flexion AAROM with Pulley Behind - 1 x daily - 7 x weekly - 3 sets - 10 reps  Seated Shoulder Abduction AAROM with Pulley Behind - 1 x daily - 7 x weekly - 3 sets - 10 reps  Scapular Retraction with Resistance - 1 x daily - 7 x weekly - 3 sets - 10 reps

## 2020-08-06 NOTE — Telephone Encounter (Signed)
Patient calling in stating he had a message from the pharmacy stating the Xarelto medication was going to be around $250. Patient is unable to afford this.   Is there any other option for this patient

## 2020-08-06 NOTE — Telephone Encounter (Signed)
Patient calling to update States for 90 days of Xarelto it is $362.67 Please call

## 2020-08-06 NOTE — Telephone Encounter (Signed)
Spoke with Myriam Jacobson at [facility] who states patient still has not met his deductible. Xarelto is covered however the total cost is $455. $119.10 is being applied toward his deductible which leaves his copay $362.67. Patient states he can not afford to pay this. Please advise.

## 2020-08-07 NOTE — Telephone Encounter (Addendum)
Called Mr. Deboer to discuss Xarelto. Left VM on home phone to request call back regarding options for anticoagulation.  ----- History:  07/14/20 Atrial fib while hospitalized, started on Eliquis  07/17/20 Rx 30 day supply of Eliquis at discharge  07/29/20 Seen in clinic, given patient assistance paperwork for Eliquis  08/05/20 Notification from pharmacy that [place] was preferred by insurance company, Eliquis discontinued, Xarelto started, patient informed by phone.   08/06/20 Notification from patient that Xarelto was not affordable  ---  Called and spoke with [facility] pharmacy. He has $445 deductible remaining. His insurance will pay out $119 on each Rx until he meets deductible. A 30-day supply of Xarelto presently would cost him $186.85. After his full $445 deductible is met, per the pharmacy staff member I spoke with his cost would decrease to approximately $67 for a 1 month supply  There are multiple options. We could proceed with Eliquis, provide samples, and have him return the Patient Assistance paperwork. Patient assistance paperwork would likely not "kick in" until he meets the $445 deductible.  We could transition to Xarelto (as preferred by insurance) and have him fill 30-day supplies at higher price of $186.85 until he meets his $445 deductible in approximately 3 months. Then 30-day supply would be more reasonable $67. We can also provide him with Xarelto patient assistance paperwork.   Loel Dubonnet, NP

## 2020-08-07 NOTE — Telephone Encounter (Signed)
Spoke with Mr. Dirico.  He has approximately 2 weeks left of his Eliquis.   He does not feel he should still have a $445 deductible to meet, encouraged him to call his insurance company.   As his insurance prefers Xarelto per the pharmacy, plan as below.   1. Finish present 2 week supply of Eliquis 2. Start Xarelto 20mg  daily with supper - will leave sample (2 bottles, 14 day supply) as well as free 30-day card for him to pick up 08/14/20 3. Will include patient assistance paperwork for Xarelto in his bag to pick up 08/14/20, he will return as soon as possible  Of note, tells me he has [facility] for his Medicare part D. Information below. Encouraged to bring when he sees [facility] next to scan into system. Will try to call them next week for additional assistance.   Member # - [place]  Customer service [phone]  Pharmacy/MD Line [phone]  [phone], NP

## 2020-08-14 ENCOUNTER — Encounter: Payer: Self-pay | Admitting: Family Medicine

## 2020-08-14 ENCOUNTER — Ambulatory Visit (INDEPENDENT_AMBULATORY_CARE_PROVIDER_SITE_OTHER): Payer: Medicare Other | Admitting: Family Medicine

## 2020-08-14 ENCOUNTER — Other Ambulatory Visit: Payer: Self-pay

## 2020-08-14 DIAGNOSIS — R05 Cough: Secondary | ICD-10-CM | POA: Diagnosis not present

## 2020-08-14 DIAGNOSIS — F419 Anxiety disorder, unspecified: Secondary | ICD-10-CM

## 2020-08-14 DIAGNOSIS — R059 Cough, unspecified: Secondary | ICD-10-CM

## 2020-08-14 DIAGNOSIS — J310 Chronic rhinitis: Secondary | ICD-10-CM | POA: Diagnosis not present

## 2020-08-14 MED ORDER — BENZONATATE 100 MG PO CAPS: 100.0000 mg | ORAL_CAPSULE | Freq: Three times a day (TID) | ORAL | 1 refills | Status: DC | PRN

## 2020-08-14 MED ORDER — BUSPIRONE HCL 5 MG PO TABS: 5.0000 mg | ORAL_TABLET | Freq: Three times a day (TID) | ORAL | 3 refills | Status: DC | PRN

## 2020-08-14 NOTE — Telephone Encounter (Signed)
Pt is concerned about his anxiety medication. States that he has been going back and forth about it and is needing it. Please return call to discuss. Dr Dorris Fetch is the only one that has something available. If you want me to schedule him, where would you want me to put him? He stated that he is going on vacation on next Friday. Pt did see Dorris Fetch today but it was acute only.
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
## 2020-08-18 ENCOUNTER — Ambulatory Visit: Payer: Medicare Other | Admitting: Physical Therapy

## 2020-08-25 ENCOUNTER — Ambulatory Visit: Payer: Medicare Other | Admitting: Physical Therapy

## 2020-08-27 ENCOUNTER — Ambulatory Visit: Payer: Medicare Other | Admitting: Physical Therapy

## 2020-09-01 ENCOUNTER — Ambulatory Visit: Payer: Medicare Other | Admitting: Physical Therapy

## 2020-09-01 ENCOUNTER — Other Ambulatory Visit: Payer: Self-pay

## 2020-09-01 ENCOUNTER — Encounter: Payer: Self-pay | Admitting: Physical Therapy

## 2020-09-01 DIAGNOSIS — M25511 Pain in right shoulder: Secondary | ICD-10-CM

## 2020-09-01 DIAGNOSIS — M7502 Adhesive capsulitis of left shoulder: Secondary | ICD-10-CM

## 2020-09-01 DIAGNOSIS — R2689 Other abnormalities of gait and mobility: Secondary | ICD-10-CM

## 2020-09-01 DIAGNOSIS — M25611 Stiffness of right shoulder, not elsewhere classified: Secondary | ICD-10-CM

## 2020-09-01 DIAGNOSIS — G8929 Other chronic pain: Secondary | ICD-10-CM

## 2020-09-01 DIAGNOSIS — M7501 Adhesive capsulitis of right shoulder: Secondary | ICD-10-CM | POA: Diagnosis not present

## 2020-09-01 DIAGNOSIS — M6281 Muscle weakness (generalized): Secondary | ICD-10-CM

## 2020-09-01 DIAGNOSIS — R279 Unspecified lack of coordination: Secondary | ICD-10-CM
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
## 2020-09-02 ENCOUNTER — Encounter: Payer: Self-pay | Admitting: Family Medicine

## 2020-09-03 ENCOUNTER — Ambulatory Visit: Payer: Medicare Other

## 2020-09-03 ENCOUNTER — Encounter: Payer: Self-pay | Admitting: Physical Therapy

## 2020-09-03 ENCOUNTER — Other Ambulatory Visit: Payer: Self-pay

## 2020-09-03 DIAGNOSIS — M7502 Adhesive capsulitis of left shoulder: Secondary | ICD-10-CM | POA: Diagnosis not present

## 2020-09-03 DIAGNOSIS — G8929 Other chronic pain: Secondary | ICD-10-CM

## 2020-09-03 DIAGNOSIS — M6281 Muscle weakness (generalized): Secondary | ICD-10-CM

## 2020-09-03 DIAGNOSIS — M25511 Pain in right shoulder: Secondary | ICD-10-CM | POA: Diagnosis not present

## 2020-09-03 DIAGNOSIS — M7501 Adhesive capsulitis of right shoulder: Secondary | ICD-10-CM | POA: Diagnosis not present

## 2020-09-03 DIAGNOSIS — M25611 Stiffness of right shoulder, not elsewhere classified: Secondary | ICD-10-CM
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
## 2020-09-04 ENCOUNTER — Telehealth: Payer: Self-pay

## 2020-09-04 NOTE — Telephone Encounter (Signed)
I'm off today but can call him Monday. Would that be okay?  He was given paperwork for patient assistance with Xarelto. When he has completed his portion, simply needs to drop them off at the office and we will complete our portion before sending to the company.   Best, Gillian Shields, NP

## 2020-09-04 NOTE — Telephone Encounter (Signed)
Patient calling.  Stated Gillian Shields gave him some papers to fill out at his last office visit and has some questions about it.   He would like Caitlin to return his call.

## 2020-09-04 NOTE — Telephone Encounter (Signed)
Spoke with the patient. Advised him that Gillian Shields, NP is out of the office and will back on Mon 09/07/20.  Patient called regarding his Xarelto patients assistance. He wants to know if he can attach proof of his finances other than his tax return. He is not comfortable sharing that info. Adv the patient that sometimes SS statements are acceptable. Recommended he contact the 1-800 number on the application to discuss with them what proof of his finances would be acceptable.   Adv the patient that Luther Parody could call him on Monday. Patient sts that he would like that.

## 2020-09-07 NOTE — Telephone Encounter (Signed)
Returned call to Mr. Baylock to discuss Xarelto patient assistance.   Discussed that if he wanted to include his tax documents he could mail directly to the company as our portion of the application is already filled out. The other option is for him to check 'yes' to the box in section 4 which allows [employer] to run a credit check to verify income. He verbalized understanding. Prefers to select 'yes' in section 4 and for our office to fax. He will drop off 09/08/20 to be faxed. Our office will need to print and attach copies of insurance cards.   He has 12 tablets of Xarelto left. He has already used free 30-day coupon at pharmacy. Upcoming appt with Dr. Azucena Cecil 09/25/20. Will route note to triage team to request samples of Xarelto be left at front desk for him to pick up if available.   If he does not qualify for [employer] patient assistance, will need to apply for [employer] program (this will get him $85 per month co-pay).   Alver Sorrow, NP

## 2020-09-08 ENCOUNTER — Ambulatory Visit: Payer: Medicare Other | Attending: Neurology

## 2020-09-08 ENCOUNTER — Other Ambulatory Visit: Payer: Self-pay

## 2020-09-08 ENCOUNTER — Encounter: Payer: Self-pay | Admitting: Physical Therapy

## 2020-09-08 DIAGNOSIS — M25611 Stiffness of right shoulder, not elsewhere classified: Secondary | ICD-10-CM | POA: Insufficient documentation

## 2020-09-08 DIAGNOSIS — R2689 Other abnormalities of gait and mobility: Secondary | ICD-10-CM

## 2020-09-08 DIAGNOSIS — M25511 Pain in right shoulder: Secondary | ICD-10-CM | POA: Diagnosis not present

## 2020-09-08 DIAGNOSIS — M7502 Adhesive capsulitis of left shoulder: Secondary | ICD-10-CM | POA: Insufficient documentation

## 2020-09-08 DIAGNOSIS — M7501 Adhesive capsulitis of right shoulder: Secondary | ICD-10-CM

## 2020-09-08 DIAGNOSIS — M6281 Muscle weakness (generalized): Secondary | ICD-10-CM | POA: Diagnosis not present

## 2020-09-08 DIAGNOSIS — R279 Unspecified lack of coordination: Secondary | ICD-10-CM | POA: Insufficient documentation

## 2020-09-08 DIAGNOSIS — G8929 Other chronic pain: Secondary | ICD-10-CM | POA: Diagnosis not present

## 2020-09-08 NOTE — Telephone Encounter (Signed)
Bryna Colander, RN  Alver Sorrow, NP 17 hours ago (4:18 PM)   I did print off his insurance cards. So I have that done. Now I just need to see if we have some samples but may need to wait until tomorrow early AM to do that.    Routing comment      Medication Samples have been left at front desk for the patient.  Drug name: Xarelto       Strength: 20 mg        Qty: 2 bottles  LOT: [id number]  Exp.Date: 07/2022
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
## 2020-09-08 NOTE — Telephone Encounter (Signed)
Application received and was completed by patient and provider. Med list attached with documents. Faxed successfully to [employer] and [employer]. Placed in file bin in medication room.

## 2020-09-08 NOTE — Telephone Encounter (Signed)
Thank you both for your assistance!   Alver Sorrow, NP

## 2020-09-10 ENCOUNTER — Encounter: Payer: Medicare Other | Admitting: Physical Therapy

## 2020-09-14 NOTE — Telephone Encounter (Signed)
Patient calling to check on assistance approval.  Please call when approved or denied

## 2020-09-14 NOTE — Telephone Encounter (Signed)
Please see message below

## 2020-09-15 ENCOUNTER — Other Ambulatory Visit: Payer: Self-pay

## 2020-09-15 ENCOUNTER — Ambulatory Visit: Payer: Medicare Other

## 2020-09-15 ENCOUNTER — Encounter: Payer: Self-pay | Admitting: Physical Therapy

## 2020-09-15 DIAGNOSIS — M25611 Stiffness of right shoulder, not elsewhere classified: Secondary | ICD-10-CM

## 2020-09-15 DIAGNOSIS — M25511 Pain in right shoulder: Secondary | ICD-10-CM | POA: Diagnosis not present

## 2020-09-15 DIAGNOSIS — M7501 Adhesive capsulitis of right shoulder: Secondary | ICD-10-CM

## 2020-09-15 DIAGNOSIS — M6281 Muscle weakness (generalized): Secondary | ICD-10-CM

## 2020-09-15 DIAGNOSIS — G8929 Other chronic pain: Secondary | ICD-10-CM | POA: Diagnosis not present

## 2020-09-15 DIAGNOSIS — M7502 Adhesive capsulitis of left shoulder: Secondary | ICD-10-CM | POA: Diagnosis not present
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
## 2020-09-17 ENCOUNTER — Encounter: Payer: Self-pay | Admitting: Physical Therapy

## 2020-09-17 ENCOUNTER — Ambulatory Visit: Payer: Medicare Other

## 2020-09-17 ENCOUNTER — Other Ambulatory Visit: Payer: Self-pay

## 2020-09-17 DIAGNOSIS — M6281 Muscle weakness (generalized): Secondary | ICD-10-CM | POA: Diagnosis not present

## 2020-09-17 DIAGNOSIS — R279 Unspecified lack of coordination: Secondary | ICD-10-CM

## 2020-09-17 DIAGNOSIS — G8929 Other chronic pain: Secondary | ICD-10-CM | POA: Diagnosis not present

## 2020-09-17 DIAGNOSIS — M7501 Adhesive capsulitis of right shoulder: Secondary | ICD-10-CM

## 2020-09-17 DIAGNOSIS — M25511 Pain in right shoulder: Secondary | ICD-10-CM | POA: Diagnosis not present

## 2020-09-17 DIAGNOSIS — M7502 Adhesive capsulitis of left shoulder: Secondary | ICD-10-CM | POA: Diagnosis not present

## 2020-09-17 DIAGNOSIS — M25611 Stiffness of right shoulder, not elsewhere classified: Secondary | ICD-10-CM | POA: Diagnosis not present

## 2020-09-17 DIAGNOSIS — R2689 Other abnormalities of gait and mobility: Secondary | ICD-10-CM
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
## 2020-09-21 ENCOUNTER — Telehealth: Payer: Self-pay | Admitting: Family

## 2020-09-21 NOTE — Telephone Encounter (Signed)
Please call regarding paperwork for Xarelto. Patient would like to know status.

## 2020-09-21 NOTE — Telephone Encounter (Signed)
Victorino Dike faxed the patient assistance forms 09/08/20. You may need to call them to find out a response.   Alver Sorrow, NP

## 2020-09-22 ENCOUNTER — Encounter: Payer: Self-pay | Admitting: Family

## 2020-09-22 NOTE — Telephone Encounter (Signed)
Patient dropped off pharmacy expense report Placed in nurse box

## 2020-09-22 NOTE — Telephone Encounter (Signed)
Expense report received and faxed to [employer] at [phone]. Await determination. If patient calls back before I can call him, advise him to call [employer] in 1-2 days for status of his approval. Thanks!

## 2020-09-22 NOTE — Telephone Encounter (Signed)
Spoke to [employer]. They are in need of patient's expense report from his pharmacy of the year thus far because he has a Part D Medicare plan. Then they will be able to process the application over the next 24-48 hours.  Called patient. He is agreeable to get expense report from his pharmacy. He will bring by the office for [place] to fax. He is aware of his upcoming appointment on Friday as well. He was given the Xarelto patient assistance phone number to call to get updates as well on the status of the application.

## 2020-09-22 NOTE — Telephone Encounter (Signed)
See another encounter regarding the status of application. In process.

## 2020-09-22 NOTE — Telephone Encounter (Signed)
Spoke to [employer]. Pharmacy tech will fax the expense report to our office.

## 2020-09-22 NOTE — Telephone Encounter (Signed)
Please call to discuss if the expense report was received.

## 2020-09-22 NOTE — Telephone Encounter (Signed)
This encounter was created in error - please disregard.

## 2020-09-22 NOTE — Telephone Encounter (Signed)
Patient calling  The pharmacy told him that he would have to come in and get the expense report printed or he said that we can get in touch with the pharmacy and have it faxed over Asked if we could have them fax it  Please review and call to discuss

## 2020-09-23 ENCOUNTER — Ambulatory Visit: Payer: Medicare Other | Admitting: Physical Therapy

## 2020-09-23 ENCOUNTER — Encounter: Payer: Self-pay | Admitting: Physical Therapy

## 2020-09-23 ENCOUNTER — Other Ambulatory Visit: Payer: Self-pay

## 2020-09-23 DIAGNOSIS — M7502 Adhesive capsulitis of left shoulder: Secondary | ICD-10-CM

## 2020-09-23 DIAGNOSIS — M7501 Adhesive capsulitis of right shoulder: Secondary | ICD-10-CM

## 2020-09-23 DIAGNOSIS — G8929 Other chronic pain: Secondary | ICD-10-CM

## 2020-09-23 DIAGNOSIS — M25611 Stiffness of right shoulder, not elsewhere classified: Secondary | ICD-10-CM | POA: Diagnosis not present

## 2020-09-23 DIAGNOSIS — M6281 Muscle weakness (generalized): Secondary | ICD-10-CM

## 2020-09-23 DIAGNOSIS — M25511 Pain in right shoulder: Secondary | ICD-10-CM | POA: Diagnosis not present
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
## 2020-09-25 ENCOUNTER — Ambulatory Visit (INDEPENDENT_AMBULATORY_CARE_PROVIDER_SITE_OTHER): Payer: Medicare Other | Admitting: Cardiology

## 2020-09-25 ENCOUNTER — Other Ambulatory Visit: Payer: Self-pay

## 2020-09-25 ENCOUNTER — Encounter: Payer: Self-pay | Admitting: Cardiology

## 2020-09-25 VITALS — BP 150/74 | HR 65 | Ht 70.0 in | Wt 280.0 lb

## 2020-09-25 DIAGNOSIS — I480 Paroxysmal atrial fibrillation: Secondary | ICD-10-CM | POA: Diagnosis not present

## 2020-09-25 DIAGNOSIS — E7800 Pure hypercholesterolemia, unspecified: Secondary | ICD-10-CM | POA: Diagnosis not present

## 2020-09-25 DIAGNOSIS — I10 Essential (primary) hypertension: Secondary | ICD-10-CM
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
## 2020-09-25 NOTE — Patient Instructions (Signed)
Medication Instructions:  Your physician recommends that you continue on your current medications as directed. Please refer to the Current Medication list given to you today.  *If you need a refill on your cardiac medications before your next appointment, please call your pharmacy*   Lab Work: None ordered If you have labs (blood work) drawn today and your tests are completely normal, you will receive your results only by: . MyChart Message (if you have MyChart) OR . A paper copy in the mail If you have any lab test that is abnormal or we need to change your treatment, we will call you to review the results.   Testing/Procedures: None ordered   Follow-Up: At [facility], you and your health needs are our priority.  As part of our continuing mission to provide you with exceptional heart care, we have created designated Provider Care Teams.  These Care Teams include your primary Cardiologist (physician) and Advanced Practice Providers (APPs -  Physician Assistants and Nurse Practitioners) who all work together to provide you with the care you need, when you need it.  We recommend signing up for the patient portal called "MyChart".  Sign up information is provided on this After Visit Summary.  MyChart is used to connect with patients for Virtual Visits (Telemedicine).  Patients are able to view lab/test results, encounter notes, upcoming appointments, etc.  Non-urgent messages can be sent to your provider as well.   To learn more about what you can do with MyChart, go to [email or web address].    Your next appointment:   Your physician wants you to follow-up in: 6 months You will receive a reminder letter in the mail two months in advance. If you don't receive a letter, please call our office to schedule the follow-up appointment.   The format for your next appointment:   In Person  Provider:   You may see Brian Agbor-Etang, MD or one of the following Advanced Practice Providers on  your designated Care Team:    Christopher Berge, NP  Ryan Dunn, PA-C  Jacquelyn Visser, PA-C  Cadence Furth, PA-C    Other Instructions N/A  

## 2020-09-29 ENCOUNTER — Ambulatory Visit: Payer: Medicare Other

## 2020-09-29 ENCOUNTER — Other Ambulatory Visit: Payer: Self-pay

## 2020-09-29 ENCOUNTER — Encounter: Payer: Self-pay | Admitting: Physical Therapy

## 2020-09-29 DIAGNOSIS — M25511 Pain in right shoulder: Secondary | ICD-10-CM | POA: Diagnosis not present

## 2020-09-29 DIAGNOSIS — M7501 Adhesive capsulitis of right shoulder: Secondary | ICD-10-CM

## 2020-09-29 DIAGNOSIS — R2689 Other abnormalities of gait and mobility: Secondary | ICD-10-CM

## 2020-09-29 DIAGNOSIS — M6281 Muscle weakness (generalized): Secondary | ICD-10-CM

## 2020-09-29 DIAGNOSIS — M7502 Adhesive capsulitis of left shoulder: Secondary | ICD-10-CM | POA: Diagnosis not present

## 2020-09-29 DIAGNOSIS — M25611 Stiffness of right shoulder, not elsewhere classified: Secondary | ICD-10-CM | POA: Diagnosis not present

## 2020-09-29 DIAGNOSIS — G8929 Other chronic pain: Secondary | ICD-10-CM

## 2020-09-29 NOTE — Addendum Note (Signed)
Addended by: Fabio Bering on: 09/29/2020 05:21 PM   Modules accepted: Orders
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
## 2020-10-01 ENCOUNTER — Other Ambulatory Visit: Payer: Self-pay

## 2020-10-01 ENCOUNTER — Ambulatory Visit: Payer: Medicare Other

## 2020-10-01 ENCOUNTER — Encounter: Payer: Self-pay | Admitting: Physical Therapy

## 2020-10-01 DIAGNOSIS — M7502 Adhesive capsulitis of left shoulder: Secondary | ICD-10-CM

## 2020-10-01 DIAGNOSIS — M25611 Stiffness of right shoulder, not elsewhere classified: Secondary | ICD-10-CM

## 2020-10-01 DIAGNOSIS — G8929 Other chronic pain: Secondary | ICD-10-CM

## 2020-10-01 DIAGNOSIS — M7501 Adhesive capsulitis of right shoulder: Secondary | ICD-10-CM

## 2020-10-01 DIAGNOSIS — M6281 Muscle weakness (generalized): Secondary | ICD-10-CM | POA: Diagnosis not present

## 2020-10-01 DIAGNOSIS — M25511 Pain in right shoulder: Secondary | ICD-10-CM | POA: Diagnosis not present
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
## 2020-10-02 ENCOUNTER — Telehealth: Payer: Self-pay

## 2020-10-02 NOTE — Telephone Encounter (Signed)
Xarelto application was faxed successfully on 09/08/20. Refaxed today and received successful transmission again as well. Will have to check back on Monday.

## 2020-10-02 NOTE — Telephone Encounter (Signed)
Are you able to tell me where this application was sent? I called Patient Assistance for Xarelto and also spoke with [employer] who the Xarelto rep states handles this but they are both saynig they do not have this patient in their system. Thank you!

## 2020-10-02 NOTE — Telephone Encounter (Signed)
Patient calling checking on Eliquis patient assistance application.  Made him aware that we have not received an approval or denial.   Patient would like [place] to call and check on status.   [phone]

## 2020-10-05 NOTE — Telephone Encounter (Signed)
Tried calling Xarelto patient assistance to get update on application. I was on hold for 25 minutes and about to hang up when they answered. Spoke with representative and they need the expense report of amount spent on medications by patient this year and household size. Expense report refaxed to [phone] and [phone] successfully.  Called patient and updated him. He is aware to call them to give the household size. I warned him he may be on hold for a while. He was ok with that and understood it will take about 24-48 hours for determination once all the information is received.   Patient has about 1 week of Xarelto left at this time. Medication Samples left at front desk for patient to pick up at his convenience.  Drug name: Xarelto       Strength: 20 mg        Qty: 2 bottles  LOT: [id number]  Exp.Date: 07/2022

## 2020-10-05 NOTE — Telephone Encounter (Signed)
Patient is returning your call.  

## 2020-10-06 ENCOUNTER — Other Ambulatory Visit: Payer: Self-pay

## 2020-10-06 ENCOUNTER — Encounter: Payer: Self-pay | Admitting: Physical Therapy

## 2020-10-06 ENCOUNTER — Ambulatory Visit: Payer: Medicare Other | Attending: Neurology | Admitting: Physical Therapy

## 2020-10-06 DIAGNOSIS — M7501 Adhesive capsulitis of right shoulder: Secondary | ICD-10-CM | POA: Insufficient documentation

## 2020-10-06 DIAGNOSIS — R2689 Other abnormalities of gait and mobility: Secondary | ICD-10-CM | POA: Insufficient documentation

## 2020-10-06 DIAGNOSIS — M6281 Muscle weakness (generalized): Secondary | ICD-10-CM | POA: Diagnosis not present

## 2020-10-06 DIAGNOSIS — M25511 Pain in right shoulder: Secondary | ICD-10-CM | POA: Insufficient documentation

## 2020-10-06 DIAGNOSIS — M25611 Stiffness of right shoulder, not elsewhere classified: Secondary | ICD-10-CM

## 2020-10-06 DIAGNOSIS — R279 Unspecified lack of coordination: Secondary | ICD-10-CM | POA: Insufficient documentation

## 2020-10-06 DIAGNOSIS — G8929 Other chronic pain: Secondary | ICD-10-CM | POA: Insufficient documentation

## 2020-10-06 DIAGNOSIS — M7502 Adhesive capsulitis of left shoulder: Secondary | ICD-10-CM | POA: Insufficient documentation
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
## 2020-10-06 NOTE — Telephone Encounter (Signed)
Patient calling back regarding patient assistance. Patient states he has not been able to get in touch with Tom Underwood and [place]. He is trying to relay that he has 2 members of his household  Please advise

## 2020-10-07 NOTE — Telephone Encounter (Signed)
On hold with Xarelto patient assistance for about 20 minutes. Spoke with representative and gave her the household number of 2.  They also received the expense report. The application will now be reprocessed. Expect 24 hours to 3 days. Our office will receive a fax once determination decided.   If patient calls back you can let him know the above information.  If he has further questions, then let me know.

## 2020-10-08 ENCOUNTER — Ambulatory Visit: Payer: Medicare Other | Admitting: Physical Therapy

## 2020-10-13 ENCOUNTER — Ambulatory Visit: Payer: Medicare Other | Admitting: Physical Therapy

## 2020-10-13 ENCOUNTER — Encounter: Payer: Self-pay | Admitting: Physical Therapy

## 2020-10-13 ENCOUNTER — Other Ambulatory Visit: Payer: Self-pay

## 2020-10-13 DIAGNOSIS — G8929 Other chronic pain: Secondary | ICD-10-CM

## 2020-10-13 DIAGNOSIS — M25511 Pain in right shoulder: Secondary | ICD-10-CM | POA: Diagnosis not present

## 2020-10-13 DIAGNOSIS — M7501 Adhesive capsulitis of right shoulder: Secondary | ICD-10-CM | POA: Diagnosis not present

## 2020-10-13 DIAGNOSIS — M7502 Adhesive capsulitis of left shoulder: Secondary | ICD-10-CM

## 2020-10-13 DIAGNOSIS — M6281 Muscle weakness (generalized): Secondary | ICD-10-CM | POA: Diagnosis not present

## 2020-10-13 DIAGNOSIS — M25611 Stiffness of right shoulder, not elsewhere classified: Secondary | ICD-10-CM | POA: Diagnosis not present

## 2020-10-13 DIAGNOSIS — R2689 Other abnormalities of gait and mobility: Secondary | ICD-10-CM

## 2020-10-13 DIAGNOSIS — R279 Unspecified lack of coordination: Secondary | ICD-10-CM
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
## 2020-10-15 ENCOUNTER — Ambulatory Visit: Payer: Medicare Other | Admitting: Physical Therapy

## 2020-10-19 NOTE — Telephone Encounter (Signed)
Patient calling to check on status update of patient assistance for Xarelto Please call to discuss

## 2020-10-20 ENCOUNTER — Encounter: Payer: Self-pay | Admitting: Physical Therapy

## 2020-10-20 ENCOUNTER — Ambulatory Visit: Payer: Medicare Other | Admitting: Physical Therapy

## 2020-10-20 ENCOUNTER — Other Ambulatory Visit: Payer: Self-pay

## 2020-10-20 DIAGNOSIS — M25611 Stiffness of right shoulder, not elsewhere classified: Secondary | ICD-10-CM | POA: Diagnosis not present

## 2020-10-20 DIAGNOSIS — G8929 Other chronic pain: Secondary | ICD-10-CM

## 2020-10-20 DIAGNOSIS — M25511 Pain in right shoulder: Secondary | ICD-10-CM | POA: Diagnosis not present

## 2020-10-20 DIAGNOSIS — M6281 Muscle weakness (generalized): Secondary | ICD-10-CM | POA: Diagnosis not present

## 2020-10-20 DIAGNOSIS — M7501 Adhesive capsulitis of right shoulder: Secondary | ICD-10-CM | POA: Diagnosis not present

## 2020-10-20 DIAGNOSIS — M7502 Adhesive capsulitis of left shoulder: Secondary | ICD-10-CM | POA: Diagnosis not present

## 2020-10-20 NOTE — Telephone Encounter (Signed)
Spoke to Xarelto rep. They were waiting on the household size. I stated on 10/07/20 I had spoke to someone and was under the impression it was going to be processed. She said they can now process as e-verification only if the patient filed taxes last year. If he did not file taxes, he would need to call and give them the yearly incoming amount.   Called patient and he did file taxes for the year of 2020. I encouraged him to call Xarelto in a couple days to make sure it was still in process. He verbalized understanding and still has the phone number.
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
## 2020-10-27 ENCOUNTER — Encounter: Payer: Self-pay | Admitting: Physical Therapy

## 2020-10-27 ENCOUNTER — Ambulatory Visit: Payer: Medicare Other | Admitting: Physical Therapy

## 2020-10-27 ENCOUNTER — Other Ambulatory Visit: Payer: Self-pay

## 2020-10-27 ENCOUNTER — Ambulatory Visit: Payer: Medicare Other

## 2020-10-27 DIAGNOSIS — M25511 Pain in right shoulder: Secondary | ICD-10-CM | POA: Diagnosis not present

## 2020-10-27 DIAGNOSIS — M7501 Adhesive capsulitis of right shoulder: Secondary | ICD-10-CM

## 2020-10-27 DIAGNOSIS — G8929 Other chronic pain: Secondary | ICD-10-CM | POA: Diagnosis not present

## 2020-10-27 DIAGNOSIS — M7502 Adhesive capsulitis of left shoulder: Secondary | ICD-10-CM | POA: Diagnosis not present

## 2020-10-27 DIAGNOSIS — M6281 Muscle weakness (generalized): Secondary | ICD-10-CM

## 2020-10-27 DIAGNOSIS — M25611 Stiffness of right shoulder, not elsewhere classified: Secondary | ICD-10-CM | POA: Diagnosis not present
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
## 2020-10-28 NOTE — Telephone Encounter (Signed)
Patient coming by the office Monday to submit tax forms required per conversation with j and j .  Patient wants to make sure we black out his social number on the form as he does not want this to be shared .

## 2020-11-02 NOTE — Telephone Encounter (Signed)
Patient is going to come by with the tax documents to fax to [employer] patient assistance.  He would like [place] to black out his social security number when we fax the documents.   Medication Samples have been left at front desk for patient to pick up when he comes as well.   Drug name: Xarelto       Strength: 20 mg        Qty: 2 bottles  LOT: [id number]  Exp.Date: 07/2022

## 2020-11-02 NOTE — Telephone Encounter (Signed)
Patient calling  States that he is still trying to get authorization for assistance but he is now down to just 1 pill of Xarelto left Please call to discuss

## 2020-11-03 ENCOUNTER — Ambulatory Visit: Payer: Medicare Other | Admitting: Physical Therapy

## 2020-11-03 ENCOUNTER — Encounter: Payer: Self-pay | Admitting: Physical Therapy

## 2020-11-03 ENCOUNTER — Other Ambulatory Visit: Payer: Self-pay

## 2020-11-03 DIAGNOSIS — M25511 Pain in right shoulder: Secondary | ICD-10-CM

## 2020-11-03 DIAGNOSIS — R2689 Other abnormalities of gait and mobility: Secondary | ICD-10-CM

## 2020-11-03 DIAGNOSIS — M7501 Adhesive capsulitis of right shoulder: Secondary | ICD-10-CM

## 2020-11-03 DIAGNOSIS — M7502 Adhesive capsulitis of left shoulder: Secondary | ICD-10-CM | POA: Diagnosis not present

## 2020-11-03 DIAGNOSIS — G8929 Other chronic pain: Secondary | ICD-10-CM

## 2020-11-03 DIAGNOSIS — M25611 Stiffness of right shoulder, not elsewhere classified: Secondary | ICD-10-CM | POA: Diagnosis not present

## 2020-11-03 DIAGNOSIS — M6281 Muscle weakness (generalized): Secondary | ICD-10-CM

## 2020-11-03 DIAGNOSIS — R279 Unspecified lack of coordination: Secondary | ICD-10-CM
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
## 2020-11-06 ENCOUNTER — Telehealth: Payer: Self-pay | Admitting: Cardiology

## 2020-11-06 NOTE — Telephone Encounter (Signed)
Patient calling in to follow up on the forms he brought in for [employer] patient assistance. These were dropped off with Victorino Dike Rimmer
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
## 2020-11-09 NOTE — Telephone Encounter (Signed)
He brought them in and [place] faxed them off

## 2020-11-10 ENCOUNTER — Ambulatory Visit: Payer: Medicare Other | Attending: Neurology | Admitting: Physical Therapy

## 2020-11-10 ENCOUNTER — Ambulatory Visit (INDEPENDENT_AMBULATORY_CARE_PROVIDER_SITE_OTHER): Payer: Medicare Other | Admitting: Family Medicine

## 2020-11-10 ENCOUNTER — Other Ambulatory Visit: Payer: Self-pay

## 2020-11-10 ENCOUNTER — Encounter: Payer: Self-pay | Admitting: Family Medicine

## 2020-11-10 ENCOUNTER — Encounter: Payer: Self-pay | Admitting: Physical Therapy

## 2020-11-10 VITALS — BP 142/76 | HR 64 | Temp 98.1°F | Resp 20 | Ht 70.0 in | Wt 290.1 lb

## 2020-11-10 DIAGNOSIS — M25511 Pain in right shoulder: Secondary | ICD-10-CM | POA: Diagnosis not present

## 2020-11-10 DIAGNOSIS — G4733 Obstructive sleep apnea (adult) (pediatric): Secondary | ICD-10-CM | POA: Diagnosis not present

## 2020-11-10 DIAGNOSIS — E785 Hyperlipidemia, unspecified: Secondary | ICD-10-CM | POA: Diagnosis not present

## 2020-11-10 DIAGNOSIS — K219 Gastro-esophageal reflux disease without esophagitis: Secondary | ICD-10-CM

## 2020-11-10 DIAGNOSIS — F341 Dysthymic disorder: Secondary | ICD-10-CM

## 2020-11-10 DIAGNOSIS — G8929 Other chronic pain: Secondary | ICD-10-CM | POA: Insufficient documentation

## 2020-11-10 DIAGNOSIS — I4891 Unspecified atrial fibrillation: Secondary | ICD-10-CM | POA: Diagnosis not present

## 2020-11-10 DIAGNOSIS — M7502 Adhesive capsulitis of left shoulder: Secondary | ICD-10-CM | POA: Insufficient documentation

## 2020-11-10 DIAGNOSIS — I69351 Hemiplegia and hemiparesis following cerebral infarction affecting right dominant side: Secondary | ICD-10-CM | POA: Diagnosis not present

## 2020-11-10 DIAGNOSIS — G250 Essential tremor: Secondary | ICD-10-CM

## 2020-11-10 DIAGNOSIS — R2689 Other abnormalities of gait and mobility: Secondary | ICD-10-CM | POA: Diagnosis not present

## 2020-11-10 DIAGNOSIS — M6281 Muscle weakness (generalized): Secondary | ICD-10-CM | POA: Diagnosis not present

## 2020-11-10 DIAGNOSIS — R279 Unspecified lack of coordination: Secondary | ICD-10-CM | POA: Insufficient documentation

## 2020-11-10 DIAGNOSIS — M25611 Stiffness of right shoulder, not elsewhere classified: Secondary | ICD-10-CM | POA: Insufficient documentation

## 2020-11-10 DIAGNOSIS — I10 Essential (primary) hypertension: Secondary | ICD-10-CM

## 2020-11-10 DIAGNOSIS — Z23 Encounter for immunization: Secondary | ICD-10-CM | POA: Diagnosis not present

## 2020-11-10 DIAGNOSIS — M7501 Adhesive capsulitis of right shoulder: Secondary | ICD-10-CM | POA: Diagnosis not present

## 2020-11-10 DIAGNOSIS — R7303 Prediabetes: Secondary | ICD-10-CM

## 2020-11-10 DIAGNOSIS — R739 Hyperglycemia, unspecified: Secondary | ICD-10-CM

## 2020-11-10 DIAGNOSIS — G4700 Insomnia, unspecified: Secondary | ICD-10-CM

## 2020-11-10 DIAGNOSIS — M25512 Pain in left shoulder: Secondary | ICD-10-CM

## 2020-11-10 DIAGNOSIS — N400 Enlarged prostate without lower urinary tract symptoms: Secondary | ICD-10-CM

## 2020-11-10 DIAGNOSIS — M170 Bilateral primary osteoarthritis of knee: Secondary | ICD-10-CM
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
## 2020-11-10 MED ORDER — ROSUVASTATIN CALCIUM 20 MG PO TABS: 20.0000 mg | ORAL_TABLET | Freq: Every day | ORAL | 1 refills | Status: DC

## 2020-11-10 MED ORDER — PANTOPRAZOLE SODIUM 20 MG PO TBEC: 20.0000 mg | DELAYED_RELEASE_TABLET | Freq: Every day | ORAL | 1 refills | Status: DC

## 2020-11-10 MED ORDER — OMEGA-3-ACID ETHYL ESTERS 1 G PO CAPS: 2.0000 | ORAL_CAPSULE | Freq: Two times a day (BID) | ORAL | 1 refills | Status: DC

## 2020-11-10 NOTE — Telephone Encounter (Signed)
Spoke to representative with Xarelto PAF. The date on the patient's signature page was missing.  Refaxed the application with the date completed successfully.  Representative said they still had not received the tax documents. Refaxed the tax documents successfully. Rep said both faxes were received.  I was on the phone about 30 minutes to ensure everything else was complete. Determination in the next 24-48 hours.
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
## 2020-11-10 NOTE — Telephone Encounter (Signed)
Patient calling to check on status - told patient we should know in next 24-48 hours Patient wanted me to make office aware he only has 6 Xarelto left

## 2020-11-11 ENCOUNTER — Other Ambulatory Visit: Payer: Self-pay | Admitting: Cardiology

## 2020-11-11 MED ORDER — METOPROLOL TARTRATE 50 MG PO TABS: 50.0000 mg | ORAL_TABLET | Freq: Two times a day (BID) | ORAL | 1 refills | Status: DC

## 2020-11-11 NOTE — Telephone Encounter (Signed)
Called Xarelto patient assistance and s/w representative. I was on hold for nearly 30 minutes prior to getting through to check on the status of application. This time they did not have page 2 of the application.   Refaxed page 2 again successfully on our end. I stayed on the phone with representative for almost 50 minutes to make sure the fax went through. Unfortunately, I had to place her on hold in order to check out my patient as I was in clinic. When I returned to the call, she was not there anymore and it had gone to the survey. All I can do it assume it was received.  I will try to check back again tomorrow.   Called and updated patient with all this information. He was appreciative. I encouraged him to call [name] and [name] to make sure they have everything they need. Patient is aware I will leave samples at front desk for him to pick up at his convenience.   Drug name: Xarelto       Strength: 20 mg         Qty: 1 bottle  LOT: [id number]  Exp.Date: 08-23

## 2020-11-11 NOTE — Telephone Encounter (Signed)
°*  STAT* If patient is at the pharmacy, call can be transferred to refill team.   1. Which medications need to be refilled? (please list name of each medication and dose if known) metoprolol 50 mg bid  2. Which pharmacy/location (including street and city if local pharmacy) is medication to be sent to? [facility] [address]  3. Do they need a 30 day or 90 day supply? 90

## 2020-11-11 NOTE — Telephone Encounter (Signed)
Rx request sent to pharmacy.  

## 2020-11-12 ENCOUNTER — Ambulatory Visit: Payer: Medicare Other | Admitting: Physical Therapy

## 2020-11-12 NOTE — Telephone Encounter (Signed)
Refaxed the whole application today.

## 2020-11-12 NOTE — Telephone Encounter (Signed)
Patient spoke with [name] and johnson and they did not receive the fax.  Please resend .  This should be the page with patient signature and date .

## 2020-11-13 NOTE — Telephone Encounter (Signed)
Spoke to representative. They did receive the whole application yesterday. However, the household size and income was still blank. I let her know I had called back multiple times and the information was given. She noted that occurred in the notes for the application. She understood my frustration that we had been working on this application since October.  We called the patient via conference call and the patient was able to provide the information over the phone. She said she will alert her supervisor as to the amount of time this has taken to process.   Hopefully, the determination should be in the next 24-48 hours.  Patient was appreciative.

## 2020-11-17 ENCOUNTER — Ambulatory Visit: Payer: Medicare Other | Admitting: Physical Therapy

## 2020-11-17 ENCOUNTER — Encounter: Payer: Self-pay | Admitting: Physical Therapy

## 2020-11-17 ENCOUNTER — Other Ambulatory Visit: Payer: Self-pay

## 2020-11-17 DIAGNOSIS — M25611 Stiffness of right shoulder, not elsewhere classified: Secondary | ICD-10-CM

## 2020-11-17 DIAGNOSIS — M7501 Adhesive capsulitis of right shoulder: Secondary | ICD-10-CM | POA: Diagnosis not present

## 2020-11-17 DIAGNOSIS — R279 Unspecified lack of coordination: Secondary | ICD-10-CM

## 2020-11-17 DIAGNOSIS — R2689 Other abnormalities of gait and mobility: Secondary | ICD-10-CM

## 2020-11-17 DIAGNOSIS — M7502 Adhesive capsulitis of left shoulder: Secondary | ICD-10-CM | POA: Diagnosis not present

## 2020-11-17 DIAGNOSIS — M6281 Muscle weakness (generalized): Secondary | ICD-10-CM | POA: Diagnosis not present

## 2020-11-17 DIAGNOSIS — G8929 Other chronic pain: Secondary | ICD-10-CM

## 2020-11-17 DIAGNOSIS — M25511 Pain in right shoulder: Secondary | ICD-10-CM | POA: Diagnosis not present

## 2020-11-17 NOTE — Telephone Encounter (Signed)
Spoke to [employer]arel[employer] rep. They do have everything but it has not been completely processes yet. He said he would put it through to his bosses desk for tonight.   Called patient and updated him. He was very Adult nurse.   Medication Samples have been placed at the front desk for patient to pick up at his convenience.  Drug name: Xarelto       Strength: 20 mg        Qty: 3 bottles  LOT: [id number]  Exp.Date: 07-2022

## 2020-11-17 NOTE — Telephone Encounter (Signed)
Patient returning call.
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
## 2020-11-19 ENCOUNTER — Telehealth: Payer: Self-pay | Admitting: *Deleted

## 2020-11-19 ENCOUNTER — Ambulatory Visit: Payer: Medicare Other | Admitting: Physical Therapy

## 2020-11-19 NOTE — Chronic Care Management (AMB) (Signed)
  Chronic Care Management   Note  11/19/2020 Name: Tom Underwood MRN: [id number] DOB: 06/18/1951  Tom Underwood is a 69 y.o. year old male who is a primary care patient of Steele Sizer, MD. I reached out to Tom Underwood by phone today in response to a referral sent by Tom Underwood health plan.     [name] was given information about Chronic Care Management services today including:  1. CCM service includes personalized support from designated clinical staff supervised by his physician, including individualized plan of care and coordination with other care providers 2. 24/7 contact phone numbers for assistance for urgent and routine care needs. 3. Service will only be billed when office clinical staff spend 20 minutes or more in a month to coordinate care. 4. Only one practitioner may furnish and bill the service in a calendar month. 5. The patient may stop CCM services at any time (effective at the end of the month) by phone call to the office staff. 6. The patient will be responsible for cost sharing (co-pay) of up to 20% of the service fee (after annual deductible is met).  Patient agreed to services and verbal consent obtained.   Follow up plan: Telephone appointment with care management team member scheduled for: 12/17/2020  [facility]  Direct Dial: [phone]

## 2020-11-23 DIAGNOSIS — Z23 Encounter for immunization: Secondary | ICD-10-CM | POA: Diagnosis not present

## 2020-11-24 ENCOUNTER — Ambulatory Visit: Payer: Medicare Other | Admitting: Physical Therapy

## 2020-11-24 ENCOUNTER — Other Ambulatory Visit: Payer: Self-pay

## 2020-11-24 ENCOUNTER — Encounter: Payer: Self-pay | Admitting: Physical Therapy

## 2020-11-24 DIAGNOSIS — R2689 Other abnormalities of gait and mobility: Secondary | ICD-10-CM

## 2020-11-24 DIAGNOSIS — M7502 Adhesive capsulitis of left shoulder: Secondary | ICD-10-CM | POA: Diagnosis not present

## 2020-11-24 DIAGNOSIS — G8929 Other chronic pain: Secondary | ICD-10-CM | POA: Diagnosis not present

## 2020-11-24 DIAGNOSIS — M7501 Adhesive capsulitis of right shoulder: Secondary | ICD-10-CM

## 2020-11-24 DIAGNOSIS — M25511 Pain in right shoulder: Secondary | ICD-10-CM | POA: Diagnosis not present

## 2020-11-24 DIAGNOSIS — M25611 Stiffness of right shoulder, not elsewhere classified: Secondary | ICD-10-CM | POA: Diagnosis not present

## 2020-11-24 DIAGNOSIS — M6281 Muscle weakness (generalized): Secondary | ICD-10-CM
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
## 2020-11-26 ENCOUNTER — Ambulatory Visit: Payer: Medicare Other | Admitting: Physical Therapy

## 2020-11-26 ENCOUNTER — Encounter: Payer: Self-pay | Admitting: Family Medicine

## 2020-11-26 NOTE — Telephone Encounter (Signed)
Vonna Kotyk, from [place], calling to check on this fax. Please advise.

## 2020-11-26 NOTE — Telephone Encounter (Signed)
Late entry from yesterday, 11/25/20 around 4:40 pm:  Spoke with Representative. The application still hasn't been processes. They still need household size and yearly income. I explained this information had been provided already. Expressed to rep this has been a long tedious process. Asked if I should just refax everything. He said it was needed to have the application with the household income and size written on it (previously info had been given over the phone, which I was told was ok). I refaxed the application and asked rep to please let his higher up know so that this could be processes as I have been working on this since October.

## 2020-11-30 ENCOUNTER — Other Ambulatory Visit: Payer: Self-pay

## 2020-12-01 ENCOUNTER — Ambulatory Visit: Payer: Medicare Other | Admitting: Physical Therapy

## 2020-12-03 ENCOUNTER — Ambulatory Visit: Payer: Medicare Other | Admitting: Physical Therapy

## 2020-12-07 ENCOUNTER — Telehealth: Payer: Self-pay

## 2020-12-07 NOTE — Telephone Encounter (Signed)
Spoke with patient and he stated that he just received a call from [place] and they informed him that he was not denied yet, as his application is still being processed and they will have a decision shortly. Will follow up in a couple days with patient. He has 8 days worth of Xarelto left.

## 2020-12-07 NOTE — Telephone Encounter (Signed)
Incoming fax from [place] [facility] [place].  States "We have determined that patient does not meet the program eligibility requirements.

## 2020-12-07 NOTE — Telephone Encounter (Signed)
Patient returning call  States that he was denied patient assistance Please call to discuss

## 2020-12-08 NOTE — Telephone Encounter (Signed)
Spoke with patient yesterday as documented in a previus telephone encounter:  Spoke with patient and he stated that he just received a call from [place] and they informed him that he was not denied yet, as his application is still being processed and they will have a decision shortly. Will follow up in a couple days with patient. He has 8 days worth of Xarelto left.

## 2020-12-10 ENCOUNTER — Other Ambulatory Visit: Payer: Self-pay

## 2020-12-10 ENCOUNTER — Ambulatory Visit: Payer: Medicare Other | Attending: Neurology | Admitting: Physical Therapy

## 2020-12-10 ENCOUNTER — Encounter: Payer: Self-pay | Admitting: Physical Therapy

## 2020-12-10 DIAGNOSIS — M6281 Muscle weakness (generalized): Secondary | ICD-10-CM

## 2020-12-10 DIAGNOSIS — M7501 Adhesive capsulitis of right shoulder: Secondary | ICD-10-CM | POA: Insufficient documentation

## 2020-12-10 DIAGNOSIS — M25511 Pain in right shoulder: Secondary | ICD-10-CM | POA: Insufficient documentation

## 2020-12-10 DIAGNOSIS — R279 Unspecified lack of coordination: Secondary | ICD-10-CM | POA: Insufficient documentation

## 2020-12-10 DIAGNOSIS — R2689 Other abnormalities of gait and mobility: Secondary | ICD-10-CM | POA: Insufficient documentation

## 2020-12-10 DIAGNOSIS — M7502 Adhesive capsulitis of left shoulder: Secondary | ICD-10-CM | POA: Diagnosis not present

## 2020-12-10 DIAGNOSIS — G8929 Other chronic pain: Secondary | ICD-10-CM | POA: Insufficient documentation

## 2020-12-10 DIAGNOSIS — M25611 Stiffness of right shoulder, not elsewhere classified: Secondary | ICD-10-CM
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
## 2020-12-10 NOTE — Telephone Encounter (Signed)
Patient calling  Requesting to speak with Victorino Dike Please call to discuss

## 2020-12-11 MED ORDER — RIVAROXABAN 20 MG PO TABS: 20.0000 mg | ORAL_TABLET | Freq: Every day | ORAL | 3 refills | Status: DC

## 2020-12-11 NOTE — Addendum Note (Signed)
Addended by: Jeannetta Nap on: 12/11/2020 04:22 PM   Modules accepted: Orders

## 2020-12-11 NOTE — Telephone Encounter (Signed)
Called Xarelto patient assistance. Spoke to Vida and she processed the application. Stated that Xarelto has been approved for patient now through 12/04/21. Rx was sent to patient's pharmacy by Ulyses Amor. Confirmation call number is [facility].  Attempted to call [facility] pharmacy to confirm receipt. No answer after several rings x2 attempts.  Called patient and let him know the good news. He was very Adult nurse. He will check with [facility] to make sure they have the prescription for him to pick up. He is actually there now. He will let me know if he runs into any issues.

## 2020-12-11 NOTE — Telephone Encounter (Signed)
Spoke to patient. He only has 3 tablets left. He has not heard back from [employer] yet about patient assistance. Advised I will reach out to them to see if I can get an update. In the meantime, he will come by and pick up samples.  Drug name: Xarelto       Strength: 20 mg        Qty: 3 bottles  LOT: [id number]  Exp.Date: 08/23

## 2020-12-11 NOTE — Telephone Encounter (Signed)
Patient calling back to let me know I need to resend in the prescription.  They called the Patient assistance and got the RxBin #'s and should now be able to get the prescription once we send it in. Rx sent. They will let us know if they run into any other questions or concerns.

## 2020-12-15 ENCOUNTER — Telehealth: Payer: Self-pay
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
## 2020-12-16 ENCOUNTER — Encounter: Payer: Self-pay | Admitting: Physical Therapy

## 2020-12-17 ENCOUNTER — Other Ambulatory Visit: Payer: Self-pay

## 2020-12-17 ENCOUNTER — Ambulatory Visit: Payer: Medicare Other | Admitting: Physical Therapy

## 2020-12-17 ENCOUNTER — Encounter: Payer: Self-pay | Admitting: Physical Therapy

## 2020-12-17 ENCOUNTER — Ambulatory Visit: Payer: Medicare Other

## 2020-12-17 DIAGNOSIS — M6281 Muscle weakness (generalized): Secondary | ICD-10-CM | POA: Diagnosis not present

## 2020-12-17 DIAGNOSIS — I10 Essential (primary) hypertension: Secondary | ICD-10-CM

## 2020-12-17 DIAGNOSIS — M7502 Adhesive capsulitis of left shoulder: Secondary | ICD-10-CM | POA: Diagnosis not present

## 2020-12-17 DIAGNOSIS — E785 Hyperlipidemia, unspecified: Secondary | ICD-10-CM

## 2020-12-17 DIAGNOSIS — M25511 Pain in right shoulder: Secondary | ICD-10-CM

## 2020-12-17 DIAGNOSIS — G8929 Other chronic pain: Secondary | ICD-10-CM | POA: Diagnosis not present

## 2020-12-17 DIAGNOSIS — M25611 Stiffness of right shoulder, not elsewhere classified: Secondary | ICD-10-CM

## 2020-12-17 DIAGNOSIS — M7501 Adhesive capsulitis of right shoulder: Secondary | ICD-10-CM | POA: Diagnosis not present

## 2020-12-17 DIAGNOSIS — R2689 Other abnormalities of gait and mobility: Secondary | ICD-10-CM

## 2020-12-17 NOTE — Chronic Care Management (AMB) (Signed)
Chronic Care Management Pharmacy  Name: Tom Underwood  MRN: [id number] DOB: 1951/09/16   Chief Complaint/ HPI  Tom Underwood,  70 y.o. , male presents for his Initial CCM visit with the clinical pharmacist via telephone.  PCP : Steele Sizer, MD Patient Care Team: Steele Sizer, MD as PCP - General (Family Medicine) Kate Sable, MD as PCP - Cardiology (Cardiology) Gardiner Barefoot, DPM as Consulting Physician (Podiatry) Laneta Simmers as Physician Assistant (Urology) Germaine Pomfret, [facility] (Pharmacist)  Patient's chronic conditions include: Hypertension, Hyperlipidemia, Atrial Fibrillation, Anxiety, BPH and Insomnia and Prediabetes   Office Visits: 11/10/20: Patient presented to Dr. Ancil Boozer for follow-up. Benzonatate, methocarbamol stopped (completed course). BP 142/76.  Consult Visit: 10/01/20: Patient presented to Dr. Manuella Ghazi (Neurology)  09/25/20: Patient presented to Dr. Garen Lah (Cardiology). No medication changes made.   Objective: Allergies  Allergen Reactions  . Penicillins Itching    Medications: Outpatient Encounter Medications as of 12/17/2020  Medication Sig  . acetaminophen (TYLENOL) 650 MG CR tablet Take 650 mg by mouth every 8 (eight) hours as needed for pain.  Marland Kitchen amitriptyline (ELAVIL) 25 MG tablet Take 25 mg by mouth at bedtime as needed for sleep.  . busPIRone (BUSPAR) 5 MG tablet Take 1-2 tablets (5-10 mg total) by mouth 3 (three) times daily as needed.  . Cetirizine HCl 10 MG CAPS Take 10 mg by mouth in the morning.   . Cholecalciferol (VITAMIN D) 125 MCG (5000 UT) CAPS Take 5,000 Units by mouth daily.  Marland Kitchen glucosamine-chondroitin 500-400 MG tablet Take 2 tablets by mouth daily.   . Multiple Vitamin (MULTIVITAMIN) tablet Take 1 tablet by mouth daily.  Marland Kitchen omega-3 acid ethyl esters (LOVAZA) 1 g capsule Take 2 capsules (2 g total) by mouth 2 (two) times daily.  . pantoprazole (PROTONIX) 20 MG tablet Take 1 tablet (20 mg total) by  mouth daily.  . rivaroxaban (XARELTO) 20 MG TABS tablet Take 1 tablet (20 mg total) by mouth daily with supper.  . rosuvastatin (CRESTOR) 20 MG tablet Take 1 tablet (20 mg total) by mouth daily.  . Saccharomyces boulardii (PROBIOTIC) 250 MG CAPS Take 1 capsule by mouth daily.  . valsartan (DIOVAN) 80 MG tablet Take 1 tablet (80 mg total) by mouth daily.  Marland Kitchen VISINE DRY EYE RELIEF 1 % SOLN Place 1 drop into both eyes as needed.  . metoprolol tartrate (LOPRESSOR) 50 MG tablet Take 1 tablet (50 mg total) by mouth 2 (two) times daily.   No facility-administered encounter medications on file as of 12/17/2020.    Wt Readings from Last 3 Encounters:  11/10/20 290 lb 1.6 oz (131.6 kg)  09/25/20 280 lb (127 kg)  08/04/20 286 lb 8 oz (130 kg)    Lab Results  Component Value Date   CREATININE 0.87 07/17/2020   BUN 17 07/17/2020   GFRNONAA >60 07/17/2020   GFRAA >60 07/17/2020   NA 138 07/17/2020   K 3.6 07/17/2020   CALCIUM 9.0 07/17/2020   CO2 23 07/17/2020     Current Diagnosis/Assessment:  SDOH Interventions   Flowsheet Row Most Recent Value  SDOH Interventions   Financial Strain Interventions Intervention Not Indicated  Transportation Interventions Intervention Not Indicated      Goals Addressed            This Visit's Progress   . Chronic Care Management       CARE PLAN ENTRY (see longitudinal plan of care for additional care plan information)  Current Barriers:  .  Chronic Disease Management support, education, and care coordination needs related to Hypertension, Hyperlipidemia, Atrial Fibrillation, Anxiety, BPH and Insomnia and Prediabetes    Hypertension BP Readings from Last 3 Encounters:  11/10/20 (!) 142/76  09/25/20 (!) 150/74  08/04/20 122/68   . Pharmacist Clinical Goal(s): o Over the next 90 days, patient will work with PharmD and providers to maintain BP goal <140/90 . Current regimen:  . Metoprolol tartrate 50 mg twice daily  . Valsartan 80 mg  daily . Interventions: o Discussed low salt diet and exercising as tolerated extensively o Will initiate blood pressure monitoring plan  . Patient self care activities - Over the next 90 days, patient will: o Check blood pressure 3-5 times weekly, document, and provide at future appointments o Ensure daily salt intake < 2300 mg/day  Hyperlipidemia Lab Results  Component Value Date/Time   LDLCALC 70 05/18/2020 11:26 AM   . Pharmacist Clinical Goal(s): o Over the next 90 days, patient will work with PharmD and providers to maintain LDL goal < 70 . Current regimen:  . Lovaza 1g 2 capsules twice daily   . Rosuvastatin 20 mg daily  . Interventions: o Discussed low cholesterol diet and exercising as tolerated extensively o Will initiate cholesterol monitoring plan   Medication management . Pharmacist Clinical Goal(s): o Over the next 90 days, patient will work with PharmD and providers to maintain optimal medication adherence . Current pharmacy: [facility] . Interventions o Comprehensive medication review performed. o Continue current medication management strategy . Patient self care activities - Over the next 90 days, patient will: o Take medications as prescribed o Report any questions or concerns to PharmD and/or provider(s)      Hypertension   BP goal is:  <140/90  Office blood pressures are  BP Readings from Last 3 Encounters:  11/10/20 (!) 142/76  09/25/20 (!) 150/74  08/04/20 122/68   Patient checks BP at home daily Patient home BP readings are ranging: 144/72 (After exercise)  Typical range 125/72-139/76  Patient has failed these meds in the past: NA Patient is currently controlled on the following medications:  Marland Kitchen Metoprolol tartrate 50 mg twice daily  . Valsartan 80 mg daily   We discussed diet and exercise extensively  Plan  Continue current medications   AFIB   Patient is currently rate controlled.  Patient has failed these meds in past: NA Patient  is currently controlled on the following medications:  Marland Kitchen Metoprolol tartrate 50 mg twice daily . Xarelto 20 mg daily    We discussed:  diet and exercise extensively   Denies unusual bruising or bleeding.   Xarelto PAP apparoved for 2022.   Plan  Continue current medications  Hyperlipidemia   History of CVA 2020   LDL goal < 70  Last lipids Lab Results  Component Value Date   CHOL 135 05/18/2020   HDL 54 05/18/2020   LDLCALC 70 05/18/2020   TRIG 49 05/18/2020   CHOLHDL 2.5 05/18/2020   Hepatic Function Latest Ref Rng & Units 07/14/2020 05/18/2020 11/05/2019  Total Protein 6.5 - 8.1 g/dL 7.8 7.5 7.7  Albumin 3.5 - 5.0 g/dL 4.2 4.7 4.8  AST 15 - 41 U/L 20 19 22   ALT 0 - 44 U/L 26 23 27   Alk Phosphatase 38 - 126 U/L 90 122(H) 129(H)  Total Bilirubin 0.3 - 1.2 mg/dL 1.8(H) 0.6 0.9     The ASCVD Risk score (Coyote., et al., 2013) failed to calculate for the following reasons:  The patient has a prior MI or stroke diagnosis   Patient has failed these meds in past: NA Patient is currently controlled on the following medications:  . Lovaza 1g 2 capsules twice daily (Gets through [employer])  . Rosuvastatin 20 mg daily   We discussed:  diet and exercise extensively  Plan  Continue current medications  Anxiety   PHQ9 Score:  PHQ9 SCORE ONLY 11/10/2020 08/14/2020 08/04/2020  PHQ-9 Total Score 0 0 0   GAD7 Score: GAD 7 : Generalized Anxiety Score 10/05/2018  Nervous, Anxious, on Edge 1  Control/stop worrying 1  Worry too much - different things 1  Trouble relaxing 1  Restless 1  Easily annoyed or irritable 1  Afraid - awful might happen 1  Total GAD 7 Score 7  Anxiety Difficulty Somewhat difficult    Patient has failed these meds in past: NA Patient is currently controlled on the following medications:   . Amitriptyline 25 mg QHS PRN - Sleep  . Buspirone 5 mg 1-2 tablets three times daily PRN (two pills twice daily)   We discussed:  Mood has been stable.    Plan  Continue current medications  GERD   Patient denies dysphagia, heartburn or nausea. Expresses understanding to avoid triggers such as citrus juices, fatty foods and lying down after eating.  Currently controlled on: . Pantoprazole 20 mg daily   Stable for many years, denies breakthrough symptoms   Plan   Recommend stopping pantoprazole due to stable symptoms.   Osteoarthritis   Patient has failed these meds in past: NA Patient is currently controlled on the following medications:  . APAP CR 650 mg q8hr PRN 2 bedtime  . Glucoasmine-chondroitin 500-400 mg 2 tablets daily   We discussed:  Counseled patient to avoid >3000 mg of Acetaminophen daily.   Plan  Continue current medications  Misc / OTC   . Cetirizine 10 mg daily  . Vitamin D3 5000 units daily  . Multivitamin daily  . Probiotic daily ([employer] - Hydrologist  . Visine dry eye relief 1% soln 1 drop both eyes PRN   Plan  Continue current medications   Medication Management   Patient's preferred pharmacy is:  [place] [place] [address] Phone: [phone] Fax: [phone]  [facility] [address] [place] [address] Phone: [phone] Fax: [place], [place] [address] Phone: [phone] Fax: [phone]  Uses pill box? Yes Pt endorses 95% compliance  We discussed: Current pharmacy is preferred with insurance plan and patient is satisfied with pharmacy services  Plan  Continue current medication management strategy  Follow up: 6 month phone visit  [facility] [phone]
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
## 2020-12-18 ENCOUNTER — Telehealth: Payer: Self-pay

## 2020-12-18 DIAGNOSIS — M7501 Adhesive capsulitis of right shoulder: Secondary | ICD-10-CM | POA: Diagnosis not present

## 2020-12-18 DIAGNOSIS — M19012 Primary osteoarthritis, left shoulder: Secondary | ICD-10-CM | POA: Diagnosis not present

## 2020-12-18 DIAGNOSIS — M7502 Adhesive capsulitis of left shoulder: Secondary | ICD-10-CM | POA: Diagnosis not present

## 2020-12-18 DIAGNOSIS — M19011 Primary osteoarthritis, right shoulder: Secondary | ICD-10-CM | POA: Diagnosis not present

## 2020-12-18 NOTE — Telephone Encounter (Signed)
Copied from CRM [id number]. Topic: General - Other >> Dec 18, 2020 10:49 AM Dalphine Handing A wrote: Patient called to inform pcp that he took bp medication at 9am and Dr. Altamese Cabal saw [facility] at [facility] today and advised patient to let Dr. Carlynn Purl kno his blood pressure readings during visit were 150/90 and 125/71

## 2020-12-21 NOTE — Patient Instructions (Addendum)
Visit Information It was great speaking with you today!  Please let me know if you have any questions about our visit.  Goals Addressed            This Visit's Progress   . Chronic Care Management       CARE PLAN ENTRY (see longitudinal plan of care for additional care plan information)  Current Barriers:  . Chronic Disease Management support, education, and care coordination needs related to Hypertension, Hyperlipidemia, Atrial Fibrillation, Anxiety, BPH and Insomnia and Prediabetes    Hypertension BP Readings from Last 3 Encounters:  11/10/20 (!) 142/76  09/25/20 (!) 150/74  08/04/20 122/68   . Pharmacist Clinical Goal(s): o Over the next 90 days, patient will work with PharmD and providers to maintain BP goal <140/90 . Current regimen:  . Metoprolol tartrate 50 mg twice daily  . Valsartan 80 mg daily . Interventions: o Discussed low salt diet and exercising as tolerated extensively o Will initiate blood pressure monitoring plan  . Patient self care activities - Over the next 90 days, patient will: o Check blood pressure 3-5 times weekly, document, and provide at future appointments o Ensure daily salt intake < 2300 mg/day  Hyperlipidemia Lab Results  Component Value Date/Time   LDLCALC 70 05/18/2020 11:26 AM   . Pharmacist Clinical Goal(s): o Over the next 90 days, patient will work with PharmD and providers to maintain LDL goal < 70 . Current regimen:  . Lovaza 1g 2 capsules twice daily   . Rosuvastatin 20 mg daily  . Interventions: o Discussed low cholesterol diet and exercising as tolerated extensively o Will initiate cholesterol monitoring plan   Medication management . Pharmacist Clinical Goal(s): o Over the next 90 days, patient will work with PharmD and providers to maintain optimal medication adherence . Current pharmacy: [facility] . Interventions o Comprehensive medication review performed. o Continue current medication management strategy . Patient  self care activities - Over the next 90 days, patient will: o Take medications as prescribed o Report any questions or concerns to PharmD and/or provider(s)       Mr. Freund was given information about Chronic Care Management services today including:  1. CCM service includes personalized support from designated clinical staff supervised by his physician, including individualized plan of care and coordination with other care providers 2. 24/7 contact phone numbers for assistance for urgent and routine care needs. 3. Standard insurance, coinsurance, copays and deductibles apply for chronic care management only during months in which we provide at least 20 minutes of these services. Most insurances cover these services at 100%, however patients may be responsible for any copay, coinsurance and/or deductible if applicable. This service may help you avoid the need for more expensive face-to-face services. 4. Only one practitioner may furnish and bill the service in a calendar month. 5. The patient may stop CCM services at any time (effective at the end of the month) by phone call to the office staff.  Patient agreed to services and verbal consent obtained.   The patient verbalized understanding of instructions, educational materials, and care plan provided today and agreed to receive a mailed copy of patient instructions, educational materials, and care plan.  Telephone follow up appointment with pharmacy team member scheduled for: 06/16/21 at 10:30 AM  Garey Ham Clinical Pharmacist [facility] [phone]

## 2020-12-22 ENCOUNTER — Telehealth: Payer: Self-pay

## 2020-12-22 NOTE — Telephone Encounter (Signed)
-----   Message from Gaspar Cola, [facility] sent at 12/22/2020  7:56 AM EST ----- Regarding: FW: CCM Pharmacist Recommendations Bessie,   Can you reach out to Mr. Prescher and let him know I spoke with Dr. Carlynn Purl about his pantoprazole. She was in agreement that we can try and see if he can tolerate decreasing his pantoprazole. Please counsel him to take it every other day for the time being. He should be sure to avoid triggers such as citrus juices, fatty foods and lying down after eating. He can also keep Tums or Pepcid on hand in case he has any breakthrough symptoms of heartburn or reflux.   Thanks, Garey Ham Clinical Pharmacist [facility] [phone]  ----- Message ----- From: Alba Cory, MD Sent: 12/21/2020   7:41 PM EST To: Gaspar Cola, RPH Subject: RE: CCM Pharmacist Recommendations             Yes, ask him to take one pill every other day and see if he can tolerate it  ----- Message ----- From: Gaspar Cola, [facility] Sent: 12/21/2020  11:50 AM EST To: Alba Cory, MD Subject: CCM Pharmacist Recommendations                 Dr. Carlynn Purl,   The patient's GERD symptoms have been stable for quite some time now, and he has not had any breakthrough symptoms since he has been taking pantoprazole. What would you think about seeing if he tolerates stopping the pantoprazole to reduce his medication burden?   Thanks, Garey Ham Clinical Pharmacist [facility] [phone]

## 2020-12-22 NOTE — Progress Notes (Signed)
Spoke to patient to inform him per clinical pharmacist,, after discussion  with his PCP Dr. Carlynn Purl about his pantoprazole. She was in agreement that we can try and see if he can tolerate decreasing his pantoprazole. Please counsel him to take it every other day for the time being. He should be sure to avoid triggers such as citrus juices, fatty foods and lying down after eating. He can also keep Tums or Pepcid on hand in case he has any breakthrough symptoms of heartburn or reflux.   Patient verbalized understanding and agreed with plan.  Everlean Cherry Clinical Pharmacist Assistant [phone]

## 2020-12-24 ENCOUNTER — Ambulatory Visit: Payer: Medicare Other | Admitting: Physical Therapy

## 2020-12-24 ENCOUNTER — Encounter: Payer: Self-pay | Admitting: Physical Therapy

## 2020-12-24 ENCOUNTER — Other Ambulatory Visit: Payer: Self-pay

## 2020-12-24 DIAGNOSIS — M7501 Adhesive capsulitis of right shoulder: Secondary | ICD-10-CM

## 2020-12-24 DIAGNOSIS — R2689 Other abnormalities of gait and mobility: Secondary | ICD-10-CM

## 2020-12-24 DIAGNOSIS — G8929 Other chronic pain: Secondary | ICD-10-CM

## 2020-12-24 DIAGNOSIS — R269 Unspecified abnormalities of gait and mobility: Secondary | ICD-10-CM | POA: Diagnosis not present

## 2020-12-24 DIAGNOSIS — Z9989 Dependence on other enabling machines and devices: Secondary | ICD-10-CM | POA: Diagnosis not present

## 2020-12-24 DIAGNOSIS — I10 Essential (primary) hypertension: Secondary | ICD-10-CM | POA: Diagnosis not present

## 2020-12-24 DIAGNOSIS — M25611 Stiffness of right shoulder, not elsewhere classified: Secondary | ICD-10-CM | POA: Diagnosis not present

## 2020-12-24 DIAGNOSIS — M6281 Muscle weakness (generalized): Secondary | ICD-10-CM

## 2020-12-24 DIAGNOSIS — M7502 Adhesive capsulitis of left shoulder: Secondary | ICD-10-CM | POA: Diagnosis not present

## 2020-12-24 DIAGNOSIS — G4733 Obstructive sleep apnea (adult) (pediatric): Secondary | ICD-10-CM | POA: Diagnosis not present

## 2020-12-24 DIAGNOSIS — M25511 Pain in right shoulder: Secondary | ICD-10-CM | POA: Diagnosis not present

## 2020-12-24 DIAGNOSIS — Z8673 Personal history of transient ischemic attack (TIA), and cerebral infarction without residual deficits: Secondary | ICD-10-CM | POA: Diagnosis not present
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
## 2020-12-28 ENCOUNTER — Other Ambulatory Visit: Payer: Medicare Other

## 2020-12-28 DIAGNOSIS — Z20822 Contact with and (suspected) exposure to covid-19: Secondary | ICD-10-CM

## 2020-12-28 DIAGNOSIS — Z8616 Personal history of COVID-19: Secondary | ICD-10-CM

## 2020-12-28 HISTORY — DX: Personal history of COVID-19: Z86.16

## 2020-12-29 LAB — NOVEL CORONAVIRUS, NAA: SARS-CoV-2, NAA: DETECTED — AB

## 2020-12-29 LAB — SARS-COV-2, NAA 2 DAY TAT

## 2020-12-30 ENCOUNTER — Telehealth: Payer: Self-pay

## 2020-12-30 ENCOUNTER — Telehealth (HOSPITAL_COMMUNITY): Payer: Self-pay | Admitting: Family
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
## 2020-12-30 NOTE — Telephone Encounter (Signed)
Patient return the call and advised of the note below by Dr. Carlynn Purl noted on 12/30/20, patient verbalized understanding. He says he has very mild cold/flu-like symptoms-stuffy nose, fever onset Friday, 12/25/20.      Tom Cory, MD  12/30/2020 4:40 PM EST Back to Top     Please triage to see when symptoms started he would qualify for infusion

## 2020-12-31 ENCOUNTER — Ambulatory Visit: Payer: Self-pay | Admitting: *Deleted

## 2020-12-31 ENCOUNTER — Encounter: Payer: Medicare Other | Admitting: Physical Therapy

## 2020-12-31 NOTE — Telephone Encounter (Signed)
I'm taking Mucinex, vitamin D3, Zinc, Tylenol 1 in the morning and 2 at night as my regimen.    I'm feeling much better and so is my wife who was positive.    ( pt called in saying he was returning a call from last night)   In looking at his chart it was pertaining to the monoclonal antibody infusion which he has declined because he did not want to drive to [facility]).   The covid precautions were gone over per the note from the NP.   I asked the pt if he had any questions/concerns I could help him with today.   He let me know he was taking the above mentioned medications/suppliments and feeling better.    I answered his question regarding doing an E Visit using MyChart. No other questions/concerns voiced.

## 2021-01-06 DIAGNOSIS — M25511 Pain in right shoulder: Secondary | ICD-10-CM | POA: Diagnosis not present

## 2021-01-06 DIAGNOSIS — M25512 Pain in left shoulder: Secondary | ICD-10-CM | POA: Diagnosis not present

## 2021-01-07 ENCOUNTER — Encounter: Payer: Self-pay | Admitting: Physical Therapy

## 2021-01-07 ENCOUNTER — Other Ambulatory Visit: Payer: Self-pay

## 2021-01-07 ENCOUNTER — Ambulatory Visit: Payer: Medicare Other | Attending: Neurology | Admitting: Physical Therapy

## 2021-01-07 DIAGNOSIS — M7501 Adhesive capsulitis of right shoulder: Secondary | ICD-10-CM | POA: Diagnosis not present

## 2021-01-07 DIAGNOSIS — R279 Unspecified lack of coordination: Secondary | ICD-10-CM | POA: Insufficient documentation

## 2021-01-07 DIAGNOSIS — M25611 Stiffness of right shoulder, not elsewhere classified: Secondary | ICD-10-CM | POA: Insufficient documentation

## 2021-01-07 DIAGNOSIS — R2689 Other abnormalities of gait and mobility: Secondary | ICD-10-CM | POA: Diagnosis not present

## 2021-01-07 DIAGNOSIS — M7502 Adhesive capsulitis of left shoulder: Secondary | ICD-10-CM | POA: Diagnosis not present

## 2021-01-07 DIAGNOSIS — G8929 Other chronic pain: Secondary | ICD-10-CM | POA: Diagnosis not present

## 2021-01-07 DIAGNOSIS — M25511 Pain in right shoulder: Secondary | ICD-10-CM | POA: Diagnosis not present

## 2021-01-07 DIAGNOSIS — M6281 Muscle weakness (generalized): Secondary | ICD-10-CM | POA: Insufficient documentation
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
## 2021-01-14 ENCOUNTER — Ambulatory Visit: Payer: Medicare Other | Admitting: Physical Therapy

## 2021-01-14 ENCOUNTER — Encounter: Payer: Self-pay | Admitting: Physical Therapy

## 2021-01-14 ENCOUNTER — Other Ambulatory Visit: Payer: Self-pay

## 2021-01-14 DIAGNOSIS — G8929 Other chronic pain: Secondary | ICD-10-CM | POA: Diagnosis not present

## 2021-01-14 DIAGNOSIS — R2689 Other abnormalities of gait and mobility: Secondary | ICD-10-CM

## 2021-01-14 DIAGNOSIS — M25611 Stiffness of right shoulder, not elsewhere classified: Secondary | ICD-10-CM

## 2021-01-14 DIAGNOSIS — M6281 Muscle weakness (generalized): Secondary | ICD-10-CM | POA: Diagnosis not present

## 2021-01-14 DIAGNOSIS — R279 Unspecified lack of coordination: Secondary | ICD-10-CM

## 2021-01-14 DIAGNOSIS — M7502 Adhesive capsulitis of left shoulder: Secondary | ICD-10-CM

## 2021-01-14 DIAGNOSIS — M7501 Adhesive capsulitis of right shoulder: Secondary | ICD-10-CM

## 2021-01-14 DIAGNOSIS — M25511 Pain in right shoulder: Secondary | ICD-10-CM | POA: Diagnosis not present
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
## 2021-01-21 ENCOUNTER — Ambulatory Visit: Payer: Medicare Other | Admitting: Physical Therapy

## 2021-01-21 ENCOUNTER — Other Ambulatory Visit: Payer: Self-pay

## 2021-01-21 ENCOUNTER — Encounter: Payer: Self-pay | Admitting: Physical Therapy

## 2021-01-21 DIAGNOSIS — M7501 Adhesive capsulitis of right shoulder: Secondary | ICD-10-CM | POA: Diagnosis not present

## 2021-01-21 DIAGNOSIS — M25611 Stiffness of right shoulder, not elsewhere classified: Secondary | ICD-10-CM | POA: Diagnosis not present

## 2021-01-21 DIAGNOSIS — G8929 Other chronic pain: Secondary | ICD-10-CM | POA: Diagnosis not present

## 2021-01-21 DIAGNOSIS — M6281 Muscle weakness (generalized): Secondary | ICD-10-CM | POA: Diagnosis not present

## 2021-01-21 DIAGNOSIS — M25511 Pain in right shoulder: Secondary | ICD-10-CM

## 2021-01-21 DIAGNOSIS — M7502 Adhesive capsulitis of left shoulder: Secondary | ICD-10-CM | POA: Diagnosis not present
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
## 2021-01-26 ENCOUNTER — Telehealth: Payer: Self-pay
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
## 2021-01-28 ENCOUNTER — Other Ambulatory Visit: Payer: Self-pay

## 2021-01-28 ENCOUNTER — Ambulatory Visit: Payer: Medicare Other | Admitting: Physical Therapy

## 2021-01-28 ENCOUNTER — Encounter: Payer: Self-pay | Admitting: Physical Therapy

## 2021-01-28 DIAGNOSIS — M7501 Adhesive capsulitis of right shoulder: Secondary | ICD-10-CM | POA: Diagnosis not present

## 2021-01-28 DIAGNOSIS — M7502 Adhesive capsulitis of left shoulder: Secondary | ICD-10-CM | POA: Diagnosis not present

## 2021-01-28 DIAGNOSIS — M25611 Stiffness of right shoulder, not elsewhere classified: Secondary | ICD-10-CM

## 2021-01-28 DIAGNOSIS — G8929 Other chronic pain: Secondary | ICD-10-CM

## 2021-01-28 DIAGNOSIS — M25511 Pain in right shoulder: Secondary | ICD-10-CM | POA: Diagnosis not present

## 2021-01-28 DIAGNOSIS — M6281 Muscle weakness (generalized): Secondary | ICD-10-CM | POA: Diagnosis not present
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
## 2021-02-04 ENCOUNTER — Encounter: Payer: Self-pay | Admitting: Physical Therapy

## 2021-02-04 ENCOUNTER — Other Ambulatory Visit: Payer: Self-pay

## 2021-02-04 ENCOUNTER — Ambulatory Visit: Payer: Medicare Other | Attending: Neurology | Admitting: Physical Therapy

## 2021-02-04 DIAGNOSIS — G8929 Other chronic pain: Secondary | ICD-10-CM | POA: Insufficient documentation

## 2021-02-04 DIAGNOSIS — R2689 Other abnormalities of gait and mobility: Secondary | ICD-10-CM | POA: Diagnosis not present

## 2021-02-04 DIAGNOSIS — M25611 Stiffness of right shoulder, not elsewhere classified: Secondary | ICD-10-CM | POA: Diagnosis not present

## 2021-02-04 DIAGNOSIS — M7501 Adhesive capsulitis of right shoulder: Secondary | ICD-10-CM | POA: Diagnosis not present

## 2021-02-04 DIAGNOSIS — M6281 Muscle weakness (generalized): Secondary | ICD-10-CM | POA: Insufficient documentation

## 2021-02-04 DIAGNOSIS — M25511 Pain in right shoulder: Secondary | ICD-10-CM | POA: Diagnosis not present

## 2021-02-04 DIAGNOSIS — M7502 Adhesive capsulitis of left shoulder: Secondary | ICD-10-CM | POA: Diagnosis not present

## 2021-02-04 DIAGNOSIS — R279 Unspecified lack of coordination: Secondary | ICD-10-CM | POA: Insufficient documentation
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
## 2021-02-10 ENCOUNTER — Other Ambulatory Visit: Payer: Self-pay

## 2021-02-10 ENCOUNTER — Encounter: Payer: Self-pay | Admitting: Physical Therapy

## 2021-02-10 ENCOUNTER — Ambulatory Visit: Payer: Medicare Other | Admitting: Physical Therapy

## 2021-02-10 DIAGNOSIS — M6281 Muscle weakness (generalized): Secondary | ICD-10-CM

## 2021-02-10 DIAGNOSIS — M7501 Adhesive capsulitis of right shoulder: Secondary | ICD-10-CM

## 2021-02-10 DIAGNOSIS — G8929 Other chronic pain: Secondary | ICD-10-CM

## 2021-02-10 DIAGNOSIS — M25511 Pain in right shoulder: Secondary | ICD-10-CM

## 2021-02-10 DIAGNOSIS — M25611 Stiffness of right shoulder, not elsewhere classified: Secondary | ICD-10-CM

## 2021-02-10 DIAGNOSIS — R2689 Other abnormalities of gait and mobility: Secondary | ICD-10-CM | POA: Diagnosis not present

## 2021-02-10 DIAGNOSIS — M7502 Adhesive capsulitis of left shoulder: Secondary | ICD-10-CM
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
## 2021-02-17 ENCOUNTER — Other Ambulatory Visit: Payer: Self-pay

## 2021-02-17 ENCOUNTER — Ambulatory Visit: Payer: Medicare Other | Admitting: Physical Therapy

## 2021-02-17 ENCOUNTER — Encounter: Payer: Self-pay | Admitting: Physical Therapy

## 2021-02-17 DIAGNOSIS — R279 Unspecified lack of coordination: Secondary | ICD-10-CM

## 2021-02-17 DIAGNOSIS — M25611 Stiffness of right shoulder, not elsewhere classified: Secondary | ICD-10-CM

## 2021-02-17 DIAGNOSIS — M6281 Muscle weakness (generalized): Secondary | ICD-10-CM

## 2021-02-17 DIAGNOSIS — R2689 Other abnormalities of gait and mobility: Secondary | ICD-10-CM

## 2021-02-17 DIAGNOSIS — M7501 Adhesive capsulitis of right shoulder: Secondary | ICD-10-CM

## 2021-02-17 DIAGNOSIS — M25511 Pain in right shoulder: Secondary | ICD-10-CM | POA: Diagnosis not present

## 2021-02-17 DIAGNOSIS — G8929 Other chronic pain: Secondary | ICD-10-CM | POA: Diagnosis not present
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
## 2021-02-23 ENCOUNTER — Other Ambulatory Visit: Payer: Self-pay

## 2021-02-23 ENCOUNTER — Encounter: Payer: Self-pay | Admitting: Physical Therapy

## 2021-02-23 ENCOUNTER — Ambulatory Visit: Payer: Medicare Other | Admitting: Physical Therapy

## 2021-02-23 DIAGNOSIS — G8929 Other chronic pain: Secondary | ICD-10-CM

## 2021-02-23 DIAGNOSIS — R2689 Other abnormalities of gait and mobility: Secondary | ICD-10-CM | POA: Diagnosis not present

## 2021-02-23 DIAGNOSIS — M7502 Adhesive capsulitis of left shoulder: Secondary | ICD-10-CM

## 2021-02-23 DIAGNOSIS — M7501 Adhesive capsulitis of right shoulder: Secondary | ICD-10-CM

## 2021-02-23 DIAGNOSIS — M25511 Pain in right shoulder: Secondary | ICD-10-CM | POA: Diagnosis not present

## 2021-02-23 DIAGNOSIS — M25611 Stiffness of right shoulder, not elsewhere classified: Secondary | ICD-10-CM

## 2021-02-23 DIAGNOSIS — M6281 Muscle weakness (generalized): Secondary | ICD-10-CM

## 2021-02-23 DIAGNOSIS — R279 Unspecified lack of coordination: Secondary | ICD-10-CM
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
## 2021-02-24 ENCOUNTER — Encounter: Payer: Self-pay | Admitting: Family Medicine

## 2021-02-26 NOTE — Addendum Note (Signed)
Addended by: Cammie Mcgee on: 02/26/2021 07:22 AM   Modules accepted: Orders

## 2021-03-01 ENCOUNTER — Other Ambulatory Visit: Payer: Self-pay | Admitting: Family Medicine

## 2021-03-01 DIAGNOSIS — E785 Hyperlipidemia, unspecified: Secondary | ICD-10-CM

## 2021-03-02 ENCOUNTER — Ambulatory Visit: Payer: Medicare Other | Admitting: Physical Therapy

## 2021-03-02 ENCOUNTER — Other Ambulatory Visit: Payer: Self-pay

## 2021-03-02 ENCOUNTER — Encounter: Payer: Self-pay | Admitting: Physical Therapy

## 2021-03-02 ENCOUNTER — Telehealth: Payer: Self-pay

## 2021-03-02 DIAGNOSIS — M25611 Stiffness of right shoulder, not elsewhere classified: Secondary | ICD-10-CM

## 2021-03-02 DIAGNOSIS — M25511 Pain in right shoulder: Secondary | ICD-10-CM | POA: Diagnosis not present

## 2021-03-02 DIAGNOSIS — M6281 Muscle weakness (generalized): Secondary | ICD-10-CM

## 2021-03-02 DIAGNOSIS — M7501 Adhesive capsulitis of right shoulder: Secondary | ICD-10-CM | POA: Diagnosis not present

## 2021-03-02 DIAGNOSIS — R2689 Other abnormalities of gait and mobility: Secondary | ICD-10-CM | POA: Diagnosis not present

## 2021-03-02 DIAGNOSIS — M7502 Adhesive capsulitis of left shoulder: Secondary | ICD-10-CM

## 2021-03-02 DIAGNOSIS — R279 Unspecified lack of coordination: Secondary | ICD-10-CM

## 2021-03-02 DIAGNOSIS — G8929 Other chronic pain: Secondary | ICD-10-CM

## 2021-03-02 NOTE — Progress Notes (Signed)
Chronic Care Management Pharmacy Assistant   Name: Tom Underwood  MRN: [id number] DOB: December 02, 1951   Reason for Encounter: Hypertension  Disease State call.  Recent office visits: No recent office visits    Recent consult visits:  01/28/2021  Physical Therapy Dorene Grebe,  02/04/2021 Physical Therapy  Dorene Grebe 02/10/2021 Physical Therapy  Dorene Grebe 02/17/2021 Physical Therapy Dorene Grebe 02/23/2021 Physical Therapy [facility] visits:  None in previous 6 months  Medications: Outpatient Encounter Medications as of 03/02/2021  Medication Sig  . acetaminophen (TYLENOL) 650 MG CR tablet Take 650 mg by mouth every 8 (eight) hours as needed for pain.  Marland Kitchen amitriptyline (ELAVIL) 25 MG tablet Take 25 mg by mouth at bedtime as needed for sleep.  . busPIRone (BUSPAR) 5 MG tablet Take 1-2 tablets (5-10 mg total) by mouth 3 (three) times daily as needed.  . Cetirizine HCl 10 MG CAPS Take 10 mg by mouth in the morning.   . Cholecalciferol (VITAMIN D) 125 MCG (5000 UT) CAPS Take 5,000 Units by mouth daily.  Marland Kitchen glucosamine-chondroitin 500-400 MG tablet Take 2 tablets by mouth daily.   . metoprolol tartrate (LOPRESSOR) 50 MG tablet Take 1 tablet (50 mg total) by mouth 2 (two) times daily.  . Multiple Vitamin (MULTIVITAMIN) tablet Take 1 tablet by mouth daily.  Marland Kitchen omega-3 acid ethyl esters (LOVAZA) 1 g capsule TAKE TWO CAPSULES BY MOUTH TWICE A DAY  . pantoprazole (PROTONIX) 20 MG tablet Take 1 tablet (20 mg total) by mouth daily.  . rivaroxaban (XARELTO) 20 MG TABS tablet Take 1 tablet (20 mg total) by mouth daily with supper.  . rosuvastatin (CRESTOR) 20 MG tablet Take 1 tablet (20 mg total) by mouth daily.  . Saccharomyces boulardii (PROBIOTIC) 250 MG CAPS Take 1 capsule by mouth daily.  . valsartan (DIOVAN) 80 MG tablet Take 1 tablet (80 mg total) by mouth daily.  Marland Kitchen VISINE DRY EYE RELIEF 1 % SOLN Place 1 drop into both eyes as needed.   No facility-administered encounter  medications on file as of 03/02/2021.    Star Rating Drugs:Rosuvastatin 20 mg. Valsartan  80 mg.  Reviewed chart prior to disease state call. Spoke with patient regarding BP  Recent Office Vitals: BP Readings from Last 3 Encounters:  11/10/20 (!) 142/76  09/25/20 (!) 150/74  08/04/20 122/68   Pulse Readings from Last 3 Encounters:  11/10/20 64  09/25/20 65  08/04/20 83    Wt Readings from Last 3 Encounters:  11/10/20 290 lb 1.6 oz (131.6 kg)  09/25/20 280 lb (127 kg)  08/04/20 286 lb 8 oz (130 kg)     Kidney Function Lab Results  Component Value Date/Time   CREATININE 0.87 07/17/2020 05:24 AM   CREATININE 1.04 07/16/2020 03:45 AM   GFRNONAA >60 07/17/2020 05:24 AM   GFRAA >60 07/17/2020 05:24 AM    BMP Latest Ref Rng & Units 07/17/2020 07/16/2020 07/14/2020  Glucose 70 - 99 mg/dL 570(V) 779(T) 903(E)  BUN 8 - 23 mg/dL 17 19 14   Creatinine 0.61 - 1.24 mg/dL 0.92 3.30  BUN/Creat Ratio 10 - 24 - - -  Sodium 135 - 145 mmol/L 138 138 139  Potassium 3.5 - 5.1 mmol/L 3.6 3.5 3.4(L)  Chloride 98 - 111 mmol/L 105 103 105  CO2 22 - 32 mmol/L 23 23 22   Calcium 8.9 - 10.3 mg/dL 9.0 9.0 9.3    . Current antihypertensive regimen:   Metoprolol tartrate 50 mg twice daily  Valsartan 80 mg daily  . How often are you checking your Blood Pressure? daily . Current home BP readings:  o Patient states his blood pressure ranges around 114/84.Patient reports it was 114/84 on 03/02/2021 before taking his medications. o Patient denies headache, lightheadedness or dizziness. Patient denies any side effect from metoprolol or valsartan  . What recent interventions/DTPs have been made by any provider to improve Blood Pressure control since last CPP Visit:none ID . Any recent hospitalizations or ED visits since last visit with CPP? No  .  . What diet changes have been made to improve Blood Pressure Control?  o None ID . What exercise is being done to improve your Blood Pressure Control?   ? Patient reports he has physical therapy once a week   Adherence Review: Is the patient currently on ACE/ARB medication? Yes Does the patient have >5 day gap between last estimated fill dates? Yes   Everlean Cherry Clinical Pharmacist Assistant [phone]
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
## 2021-03-09 ENCOUNTER — Encounter: Payer: Self-pay | Admitting: Physical Therapy

## 2021-03-09 ENCOUNTER — Other Ambulatory Visit: Payer: Self-pay

## 2021-03-09 ENCOUNTER — Ambulatory Visit: Payer: Medicare Other | Attending: Neurology | Admitting: Physical Therapy

## 2021-03-09 DIAGNOSIS — M25511 Pain in right shoulder: Secondary | ICD-10-CM | POA: Diagnosis not present

## 2021-03-09 DIAGNOSIS — M7501 Adhesive capsulitis of right shoulder: Secondary | ICD-10-CM | POA: Diagnosis not present

## 2021-03-09 DIAGNOSIS — G8929 Other chronic pain: Secondary | ICD-10-CM | POA: Diagnosis not present

## 2021-03-09 DIAGNOSIS — R2689 Other abnormalities of gait and mobility: Secondary | ICD-10-CM | POA: Diagnosis not present

## 2021-03-09 DIAGNOSIS — R279 Unspecified lack of coordination: Secondary | ICD-10-CM | POA: Diagnosis not present

## 2021-03-09 DIAGNOSIS — M25611 Stiffness of right shoulder, not elsewhere classified: Secondary | ICD-10-CM | POA: Insufficient documentation

## 2021-03-09 DIAGNOSIS — M6281 Muscle weakness (generalized): Secondary | ICD-10-CM | POA: Insufficient documentation

## 2021-03-09 DIAGNOSIS — M7502 Adhesive capsulitis of left shoulder: Secondary | ICD-10-CM | POA: Insufficient documentation
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
## 2021-03-12 ENCOUNTER — Other Ambulatory Visit: Payer: Self-pay | Admitting: Family Medicine

## 2021-03-12 ENCOUNTER — Encounter: Payer: Self-pay | Admitting: Family Medicine

## 2021-03-12 DIAGNOSIS — E785 Hyperlipidemia, unspecified: Secondary | ICD-10-CM

## 2021-03-12 MED ORDER — OMEGA-3-ACID ETHYL ESTERS 1 G PO CAPS: 2.0000 | ORAL_CAPSULE | Freq: Two times a day (BID) | ORAL | 1 refills | Status: DC

## 2021-03-16 ENCOUNTER — Ambulatory Visit: Payer: Medicare Other

## 2021-03-16 ENCOUNTER — Other Ambulatory Visit: Payer: Self-pay

## 2021-03-16 ENCOUNTER — Encounter: Payer: Self-pay | Admitting: Physical Therapy

## 2021-03-16 DIAGNOSIS — M7501 Adhesive capsulitis of right shoulder: Secondary | ICD-10-CM

## 2021-03-16 DIAGNOSIS — M7502 Adhesive capsulitis of left shoulder: Secondary | ICD-10-CM

## 2021-03-16 DIAGNOSIS — M25511 Pain in right shoulder: Secondary | ICD-10-CM | POA: Diagnosis not present

## 2021-03-16 DIAGNOSIS — R279 Unspecified lack of coordination: Secondary | ICD-10-CM

## 2021-03-16 DIAGNOSIS — M6281 Muscle weakness (generalized): Secondary | ICD-10-CM | POA: Diagnosis not present

## 2021-03-16 DIAGNOSIS — G8929 Other chronic pain: Secondary | ICD-10-CM

## 2021-03-16 DIAGNOSIS — M25611 Stiffness of right shoulder, not elsewhere classified: Secondary | ICD-10-CM

## 2021-03-16 DIAGNOSIS — R2689 Other abnormalities of gait and mobility: Secondary | ICD-10-CM | POA: Diagnosis not present
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
## 2021-03-23 ENCOUNTER — Ambulatory Visit: Payer: Medicare Other | Admitting: Physical Therapy

## 2021-03-23 ENCOUNTER — Encounter: Payer: Self-pay | Admitting: Physical Therapy

## 2021-03-23 ENCOUNTER — Other Ambulatory Visit: Payer: Self-pay

## 2021-03-23 DIAGNOSIS — M25611 Stiffness of right shoulder, not elsewhere classified: Secondary | ICD-10-CM | POA: Diagnosis not present

## 2021-03-23 DIAGNOSIS — M7501 Adhesive capsulitis of right shoulder: Secondary | ICD-10-CM | POA: Diagnosis not present

## 2021-03-23 DIAGNOSIS — M25511 Pain in right shoulder: Secondary | ICD-10-CM | POA: Diagnosis not present

## 2021-03-23 DIAGNOSIS — M6281 Muscle weakness (generalized): Secondary | ICD-10-CM | POA: Diagnosis not present

## 2021-03-23 DIAGNOSIS — R2689 Other abnormalities of gait and mobility: Secondary | ICD-10-CM

## 2021-03-23 DIAGNOSIS — R279 Unspecified lack of coordination: Secondary | ICD-10-CM

## 2021-03-23 DIAGNOSIS — G8929 Other chronic pain: Secondary | ICD-10-CM | POA: Diagnosis not present
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
## 2021-03-30 ENCOUNTER — Ambulatory Visit: Payer: Medicare Other | Admitting: Physical Therapy

## 2021-03-30 ENCOUNTER — Other Ambulatory Visit: Payer: Self-pay

## 2021-03-30 ENCOUNTER — Encounter: Payer: Self-pay | Admitting: Physical Therapy

## 2021-03-30 DIAGNOSIS — M6281 Muscle weakness (generalized): Secondary | ICD-10-CM | POA: Diagnosis not present

## 2021-03-30 DIAGNOSIS — R2689 Other abnormalities of gait and mobility: Secondary | ICD-10-CM | POA: Diagnosis not present

## 2021-03-30 DIAGNOSIS — M25611 Stiffness of right shoulder, not elsewhere classified: Secondary | ICD-10-CM | POA: Diagnosis not present

## 2021-03-30 DIAGNOSIS — G8929 Other chronic pain: Secondary | ICD-10-CM | POA: Diagnosis not present

## 2021-03-30 DIAGNOSIS — M25511 Pain in right shoulder: Secondary | ICD-10-CM | POA: Diagnosis not present

## 2021-03-30 DIAGNOSIS — M7501 Adhesive capsulitis of right shoulder: Secondary | ICD-10-CM | POA: Diagnosis not present

## 2021-03-30 DIAGNOSIS — R279 Unspecified lack of coordination: Secondary | ICD-10-CM
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
## 2021-04-06 ENCOUNTER — Encounter: Payer: Medicare Other | Admitting: Physical Therapy

## 2021-04-13 ENCOUNTER — Other Ambulatory Visit: Payer: Self-pay

## 2021-04-13 ENCOUNTER — Encounter: Payer: Self-pay | Admitting: Physical Therapy

## 2021-04-13 ENCOUNTER — Ambulatory Visit: Payer: Medicare Other | Attending: Neurology | Admitting: Physical Therapy

## 2021-04-13 DIAGNOSIS — M7502 Adhesive capsulitis of left shoulder: Secondary | ICD-10-CM | POA: Diagnosis not present

## 2021-04-13 DIAGNOSIS — M25511 Pain in right shoulder: Secondary | ICD-10-CM | POA: Diagnosis not present

## 2021-04-13 DIAGNOSIS — M6281 Muscle weakness (generalized): Secondary | ICD-10-CM | POA: Insufficient documentation

## 2021-04-13 DIAGNOSIS — M7501 Adhesive capsulitis of right shoulder: Secondary | ICD-10-CM | POA: Insufficient documentation

## 2021-04-13 DIAGNOSIS — G8929 Other chronic pain: Secondary | ICD-10-CM | POA: Insufficient documentation

## 2021-04-13 DIAGNOSIS — M25611 Stiffness of right shoulder, not elsewhere classified: Secondary | ICD-10-CM | POA: Insufficient documentation

## 2021-04-13 DIAGNOSIS — R279 Unspecified lack of coordination: Secondary | ICD-10-CM | POA: Diagnosis not present

## 2021-04-13 DIAGNOSIS — R2689 Other abnormalities of gait and mobility: Secondary | ICD-10-CM | POA: Insufficient documentation
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
## 2021-04-20 ENCOUNTER — Encounter: Payer: Self-pay | Admitting: Physical Therapy

## 2021-04-20 ENCOUNTER — Other Ambulatory Visit: Payer: Self-pay

## 2021-04-20 ENCOUNTER — Ambulatory Visit: Payer: Medicare Other | Admitting: Physical Therapy

## 2021-04-20 DIAGNOSIS — R2689 Other abnormalities of gait and mobility: Secondary | ICD-10-CM

## 2021-04-20 DIAGNOSIS — M25511 Pain in right shoulder: Secondary | ICD-10-CM | POA: Diagnosis not present

## 2021-04-20 DIAGNOSIS — R279 Unspecified lack of coordination: Secondary | ICD-10-CM

## 2021-04-20 DIAGNOSIS — M25611 Stiffness of right shoulder, not elsewhere classified: Secondary | ICD-10-CM

## 2021-04-20 DIAGNOSIS — M7502 Adhesive capsulitis of left shoulder: Secondary | ICD-10-CM | POA: Diagnosis not present

## 2021-04-20 DIAGNOSIS — G8929 Other chronic pain: Secondary | ICD-10-CM

## 2021-04-20 DIAGNOSIS — M7501 Adhesive capsulitis of right shoulder: Secondary | ICD-10-CM

## 2021-04-20 DIAGNOSIS — M6281 Muscle weakness (generalized): Secondary | ICD-10-CM
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
## 2021-04-22 ENCOUNTER — Other Ambulatory Visit: Payer: Self-pay

## 2021-04-22 ENCOUNTER — Ambulatory Visit (INDEPENDENT_AMBULATORY_CARE_PROVIDER_SITE_OTHER): Payer: Medicare Other

## 2021-04-22 ENCOUNTER — Telehealth: Payer: Self-pay

## 2021-04-22 VITALS — BP 118/78 | HR 94 | Temp 97.7°F | Resp 17 | Ht 70.0 in | Wt 283.7 lb

## 2021-04-22 DIAGNOSIS — Z0000 Encounter for general adult medical examination without abnormal findings: Secondary | ICD-10-CM | POA: Diagnosis not present

## 2021-04-22 NOTE — Progress Notes (Signed)
Subjective:   Tom Underwood is a 70 y.o. male who presents for Medicare Annual/Subsequent preventive examination.  Review of Systems     Cardiac Risk Factors include: advanced age (>65men, >3 women);dyslipidemia;male gender;hypertension;obesity (BMI >30kg/m2)     Objective:    Today's Vitals   04/22/21 0922 04/22/21 0931  BP: 118/78   Pulse: 94   Resp: 17   Temp: 97.7 F (36.5 C)   TempSrc: Oral   SpO2: 97%   Weight: 283 lb 11.2 oz (128.7 kg)   Height: 5\' 10"  (1.778 m)   PainSc:  2    Body mass index is 40.71 kg/m.  Advanced Directives 04/22/2021 07/14/2020 04/21/2020 08/30/2017 06/02/2017 02/24/2017 01/30/2017  Does Patient Have a Medical Advance Directive? Yes No No No No No No  Type of 02/01/2017 of [place];Living will - - - - - -  Copy of Healthcare Power of Attorney in Chart? No - copy requested - - - - - -  Would patient like information on creating a medical advance directive? - No - Patient declined Yes (MAU/Ambulatory/Procedural Areas - Information given) - - - -    Current Medications (verified) Outpatient Encounter Medications as of 04/22/2021  Medication Sig  . acetaminophen (TYLENOL) 650 MG CR tablet Take 650 mg by mouth every 8 (eight) hours as needed for pain.  04/24/2021 amitriptyline (ELAVIL) 25 MG tablet Take 25 mg by mouth at bedtime as needed for sleep.  . busPIRone (BUSPAR) 5 MG tablet Take 1-2 tablets (5-10 mg total) by mouth 3 (three) times daily as needed.  . Cetirizine HCl 10 MG CAPS Take 10 mg by mouth in the morning.   . Cholecalciferol (VITAMIN D) 125 MCG (5000 UT) CAPS Take 5,000 Units by mouth daily.  Marland Kitchen glucosamine-chondroitin 500-400 MG tablet Take 2 tablets by mouth daily.   . Multiple Vitamin (MULTIVITAMIN) tablet Take 1 tablet by mouth daily.  Marland Kitchen omega-3 acid ethyl esters (LOVAZA) 1 g capsule Take 2 capsules (2 g total) by mouth 2 (two) times daily.  . pantoprazole (PROTONIX) 20 MG tablet Take 1 tablet (20 mg total) by  mouth daily.  . rivaroxaban (XARELTO) 20 MG TABS tablet Take 1 tablet (20 mg total) by mouth daily with supper.  . rosuvastatin (CRESTOR) 20 MG tablet Take 1 tablet (20 mg total) by mouth daily.  . Saccharomyces boulardii (PROBIOTIC) 250 MG CAPS Take 1 capsule by mouth daily.  . valsartan (DIOVAN) 80 MG tablet Take 1 tablet (80 mg total) by mouth daily.  Marland Kitchen VISINE DRY EYE RELIEF 1 % SOLN Place 1 drop into both eyes as needed.  . metoprolol tartrate (LOPRESSOR) 50 MG tablet Take 1 tablet (50 mg total) by mouth 2 (two) times daily.   No facility-administered encounter medications on file as of 04/22/2021.    Allergies (verified) Penicillins   History: Past Medical History:  Diagnosis Date  . Allergy   . Decreased libido   . Diverticulitis   . GERD (gastroesophageal reflux disease)   . HLD (hyperlipidemia)   . Hydronephrosis with renal and ureteral calculus obstruction   . Hydronephrosis with renal and ureteral calculus obstruction   . Hypertension   . Hypogonadism in male   . Rhinitis, allergic   . Sleep apnea   . Stroke ([facility])   . Ureteral stone    Past Surgical History:  Procedure Laterality Date  . EXTERNAL EAR SURGERY    . FINGER SURGERY    . KNEE SURGERY    .  left eye     Family History  Problem Relation Age of Onset  . Asthma Mother   . Heart disease Mother   . Heart attack Mother   . Dementia Mother   . Asthma Father   . Heart disease Father   . Stroke Father    Social History   Socioeconomic History  . Marital status: Married    Spouse name: Lupita LeashDonna   . Number of children: 2  . Years of education: Not on file  . Highest education level: Not on file  Occupational History  . Occupation: retired   Tobacco Use  . Smoking status: Former Smoker    Types: Pipe    Start date: 02/20/1973    Quit date: 02/20/1974    Years since quitting: 47.2  . Smokeless tobacco: Never Used  Vaping Use  . Vaping Use: Never used  Substance and Sexual Activity  . Alcohol use:  No    Alcohol/week: 0.0 standard drinks  . Drug use: No  . Sexual activity: Yes    Partners: Female  Other Topics Concern  . Not on file  Social History Narrative  . Not on file   Social Determinants of Health   Financial Resource Strain: Low Risk   . Difficulty of Paying Living Expenses: Not hard at all  Food Insecurity: No Food Insecurity  . Worried About Programme researcher, broadcasting/film/videounning Out of Food in the Last Year: Never true  . Ran Out of Food in the Last Year: Never true  Transportation Needs: No Transportation Needs  . Lack of Transportation (Medical): No  . Lack of Transportation (Non-Medical): No  Physical Activity: Insufficiently Active  . Days of Exercise per Week: 4 days  . Minutes of Exercise per Session: 10 min  Stress: Stress Concern Present  . Feeling of Stress : To some extent  Social Connections: Socially Integrated  . Frequency of Communication with Friends and Family: More than three times a week  . Frequency of Social Gatherings with Friends and Family: Twice a week  . Attends Religious Services: More than 4 times per year  . Active Member of Clubs or Organizations: Yes  . Attends BankerClub or Organization Meetings: More than 4 times per year  . Marital Status: Married    Tobacco Counseling Counseling given: Not Answered   Clinical Intake:  Pre-visit preparation completed: Yes  Pain : 0-10 Pain Score: 2  Pain Type: Chronic pain Pain Location: Leg Pain Orientation: Right,Left Pain Descriptors / Indicators: Aching,Constant Pain Onset: More than a month ago Pain Frequency: Constant     BMI - recorded: 40.71 Nutritional Status: BMI > 30  Obese Nutritional Risks: None Diabetes: No  How often do you need to have someone help you when you read instructions, pamphlets, or other written materials from your doctor or pharmacy?: 1 - Never    Interpreter Needed?: No  Information entered by :: Reather LittlerKasey Kathia Covington LPN   Activities of Daily Living In your present state of health,  do you have any difficulty performing the following activities: 04/22/2021 11/10/2020  Hearing? N N  Comment declines hearing aids -  Vision? N N  Difficulty concentrating or making decisions? N N  Walking or climbing stairs? Y Y  Dressing or bathing? N Y  Comment - -  Doing errands, shopping? N N  Preparing Food and eating ? N -  Using the Toilet? N -  In the past six months, have you accidently leaked urine? Y -  Do you have problems  with loss of bowel control? N -  Managing your Medications? N -  Managing your Finances? N -  Housekeeping or managing your Housekeeping? N -  Some recent data might be hidden    Patient Care Team: Alba Cory, MD as PCP - General (Family Medicine) Debbe Odea, MD as PCP - Cardiology (Cardiology) Helane Gunther, DPM as Consulting Physician (Podiatry) Marvel Plan Elana Alm as Physician Assistant (Urology) Gaspar Cola, [facility] (Pharmacist)  Indicate any recent Medical Services you may have received from other than [facility] providers in the past year (date may be approximate).     Assessment:   This is a routine wellness examination for [place].  Hearing/Vision screen  Hearing Screening   125Hz  250Hz  500Hz  1000Hz  2000Hz  3000Hz  4000Hz  6000Hz  8000Hz   Right ear:           Left ear:           Comments:  Pt denies hearing difficulty  Vision Screening Comments: [facility] screen[facility]  Dietary issues and exercise activities discussed: Current Exercise Habits: Home exercise routine, Type of exercise: walking, Time (Minutes): 15, Frequency (Times/Week): 4, Weekly Exercise (Minutes/Week): 60, Intensity: Mild, Exercise limited by: neurologic condition(s);orthopedic condition(s);cardiac condition(s);respiratory conditions(s)  Goals Addressed   None    Depression Screen PHQ 2/9 Scores 04/22/2021 11/10/2020 08/14/2020 08/04/2020 05/11/2020 04/21/2020 03/26/2020  PHQ - 2 Score 2 0 0 0 2 0 0  PHQ- 9 Score 2 - - - 2 - 0     Fall Risk Fall Risk  04/22/2021 11/10/2020 08/14/2020 08/04/2020 05/11/2020  Falls in the past year? 0 0 0 0 0  Number falls in past yr: 0 0 0 0 -  Injury with Fall? 0 0 0 0 -  Risk for fall due to : Impaired balance/gait Impaired balance/gait - - Impaired balance/gait  Follow up Falls prevention discussed - Falls evaluation completed Falls evaluation completed Falls prevention discussed    FALL RISK PREVENTION PERTAINING TO THE HOME:  Any stairs in or around the home? Yes  If so, are there any without handrails? No  Home free of loose throw rugs in walkways, pet beds, electrical cords, etc? Yes  Adequate lighting in your home to reduce risk of falls? Yes   ASSISTIVE DEVICES UTILIZED TO PREVENT FALLS:  Life alert? No  Use of a cane, walker or w/c? Yes  Grab bars in the bathroom? Yes  Shower chair or bench in shower? Yes  Elevated toilet seat or a handicapped toilet? Yes   TIMED UP AND GO:  Was the test performed? Yes .  Length of time to ambulate 10 feet: 8 sec.   Gait slow and steady with assistive device  Cognitive Function:     6CIT Screen 04/21/2020  What Year? 0 points  What month? 0 points  What time? 0 points  Count back from 20 0 points  Months in reverse 0 points  Repeat phrase 0 points  Total Score 0    Immunizations Immunization History  Administered Date(s) Administered  . Fluad Quad(high Dose 65+) 09/18/2019, 11/10/2020  . Influenza, High Dose Seasonal PF 10/05/2018  . Influenza,inj,Quad PF,6+ Mos 08/30/2017  . Influenza-Unspecified 09/26/2016  . Moderna Sars-Covid-2 Vaccination 01/21/2020, 02/18/2020, 11/23/2020  . Pneumococcal Conjugate-13 11/05/2019  . Pneumococcal Polysaccharide-23 10/19/2018  . Tdap 02/16/2018, 07/14/2020  . Zoster 02/24/2017    TDAP status: Up to date  Flu Vaccine status: Up to date  Pneumococcal vaccine status: Up to date  Covid-19 vaccine status:  Completed vaccines  Qualifies for Shingles Vaccine? Yes   Zostavax  completed Yes   Shingrix Completed?: No.    Education has been provided regarding the importance of this vaccine. Patient has been advised to call insurance company to determine out of pocket expense if they have not yet received this vaccine. Advised may also receive vaccine at local pharmacy or Health Dept. Verbalized acceptance and understanding.  Screening Tests Health Maintenance  Topic Date Due  . INFLUENZA VACCINE  07/05/2021  . COLONOSCOPY (Pts 45-5yrs Insurance coverage will need to be confirmed)  09/27/2023  . TETANUS/TDAP  07/14/2030  . COVID-19 Vaccine  Completed  . Hepatitis C Screening  Completed  . PNA vac Low Risk Adult  Completed  . HPV VACCINES  Aged Out    Health Maintenance  There are no preventive care reminders to display for this patient.  Colorectal cancer screening: Type of screening: Colonoscopy. Completed 09/26/13. Repeat every 10 years  Lung Cancer Screening: (Low Dose CT Chest recommended if Age 44-80 years, 30 pack-year currently smoking OR have quit w/in 15years.) does not qualify.  Additional Screening:  Hepatitis C Screening: does qualify; Completed 02/16/18  Vision Screening: Recommended annual ophthalmology exams for early detection of glaucoma and other disorders of the eye. Is the patient up to date with their annual eye exam?  Yes  Who is the provider or what is the name of the office in which the patient attends annual eye exams? [facility].   Dental Screening: Recommended annual dental exams for proper oral hygiene  Community Resource Referral / Chronic Care Management: CRR required this visit?  No   CCM required this visit?  No      Plan:     I have personally reviewed and noted the following in the patient's chart:   . Medical and social history . Use of alcohol, tobacco or illicit drugs  . Current medications and supplements including opioid prescriptions. Patient is not currently taking opioid  prescriptions. . Functional ability and status . Nutritional status . Physical activity . Advanced directives . List of other physicians . Hospitalizations, surgeries, and ER visits in previous 12 months . Vitals . Screenings to include cognitive, depression, and falls . Referrals and appointments  In addition, I have reviewed and discussed with patient certain preventive protocols, quality metrics, and best practice recommendations. A written personalized care plan for preventive services as well as general preventive health recommendations were provided to patient.     Reather Littler, LPN   [id number]/[id number]   Nurse Notes: none

## 2021-04-22 NOTE — Progress Notes (Signed)
Chronic Care Management Pharmacy Assistant   Name: TIVON LEMOINE  MRN: [id number] DOB: Feb 19, 1951  Reason for Encounter: Medication Review /General Adherence Call.   Recent office visits:  04/22/2021 Reather Littler LPN  (PCP Office)  Recent consult visits:  04/23/2021 Dr.Agbor-Etang MD (cardiology)- Started Furosemide 20 mg PRN for lower extremity swelling 04/20/2021 Dorene Grebe (Physical Therapy) 04/13/2021 Dorene Grebe (Physical Therapy) 03/30/2021 Dorene Grebe (Physical Therapy) 03/23/2021 Dorene Grebe (Physical Therapy) 03/16/2021 Dorene Grebe (Physical Therapy) 03/09/2021 Dorene Grebe (Physical Therapy) 03/02/2021 Dorene Grebe (Physical Therapy)  Hospital visits:  None in previous 6 months  Medications: Outpatient Encounter Medications as of 04/22/2021  Medication Sig Note  . acetaminophen (TYLENOL) 650 MG CR tablet Take 650 mg by mouth every 8 (eight) hours as needed for pain. 04/22/2021: Pt taking 2 tabs QHS   . amitriptyline (ELAVIL) 25 MG tablet Take 25 mg by mouth at bedtime as needed for sleep.   . busPIRone (BUSPAR) 5 MG tablet Take 1-2 tablets (5-10 mg total) by mouth 3 (three) times daily as needed.   . Cetirizine HCl 10 MG CAPS Take 10 mg by mouth in the morning.    . Cholecalciferol (VITAMIN D) 125 MCG (5000 UT) CAPS Take 5,000 Units by mouth daily.   Marland Kitchen glucosamine-chondroitin 500-400 MG tablet Take 2 tablets by mouth daily.    . metoprolol tartrate (LOPRESSOR) 50 MG tablet Take 1 tablet (50 mg total) by mouth 2 (two) times daily.   . Multiple Vitamin (MULTIVITAMIN) tablet Take 1 tablet by mouth daily.   Marland Kitchen omega-3 acid ethyl esters (LOVAZA) 1 g capsule Take 2 capsules (2 g total) by mouth 2 (two) times daily.   . pantoprazole (PROTONIX) 20 MG tablet Take 1 tablet (20 mg total) by mouth daily.   . rivaroxaban (XARELTO) 20 MG TABS tablet Take 1 tablet (20 mg total) by mouth daily with supper.   . rosuvastatin (CRESTOR) 20 MG tablet Take 1 tablet (20 mg  total) by mouth daily.   . Saccharomyces boulardii (PROBIOTIC) 250 MG CAPS Take 1 capsule by mouth daily.   . valsartan (DIOVAN) 80 MG tablet Take 1 tablet (80 mg total) by mouth daily.   Marland Kitchen VISINE DRY EYE RELIEF 1 % SOLN Place 1 drop into both eyes as needed.    No facility-administered encounter medications on file as of 04/22/2021.    Star Rating Drugs: Rosuvastatin 20 mg last filled on 10/27/2020 for 90 day supply at [facility]. Valsartan 80 mg last filled on 10/10/2020 for 90 day supply at [facility]  Called patient and discussed medication adherence  with patient, no issues at this time with current medication.   Patient denies ED visit since his last CPP follow up.  Patient denies any side effects with his medication. Patient denies any problems with his current pharmacy  Patient states his cardiologist started him on Furosemide PRN for his lower extremity swelling.   Follow up telephone appointment is schedule on 06/16/2021 at 10:30 am with Clinical Pharmacist.  Adherence issue: Rosuvastatin 20 mg last filled on 10/27/2020 for 90 day supply at [facility]. Valsartan 80 mg last filled on 10/10/2020 for 90 day supply at [facility].  Patient denies side effects from Rosuvastatin or Valsartan.Patient states he takes his valsartan and rosuvastatin as prescribe.Patient confirms he receives his rosuvastatin and valsartan from his  current pharmacy [facility].  Reach out to [facility] pharmacy to confirm last filled date of Rosuvastatin and Valsartan. [facility] telephone is not available at  this time    Everlean Cherry Clinical Pharmacist Assistant [phone]

## 2021-04-22 NOTE — Patient Instructions (Signed)
Tom Underwood , Thank you for taking time to come for your Medicare Wellness Visit. I appreciate your ongoing commitment to your health goals. Please review the following plan we discussed and let me know if I can assist you in the future.   Screening recommendations/referrals: Colonoscopy: done 09/27/13. Repeat in 2024 Recommended yearly ophthalmology/optometry visit for glaucoma screening and checkup Recommended yearly dental visit for hygiene and checkup  Vaccinations: Influenza vaccine: done 11/10/20 Pneumococcal vaccine: done 11/05/19 Tdap vaccine: done 07/14/20 Shingles vaccine: Shingrix discussed. Please contact your pharmacy for coverage information.  Covid-19: done 01/18/20, 02/18/20 & 11/23/20  Advanced directives: Please bring a copy of your health care power of attorney and living will to the office at your convenience.  Conditions/risks identified: Recommend continuing fall prevention in the fall  Next appointment: Follow up in one year for your annual wellness visit.   Preventive Care 30 Years and Older, Male Preventive care refers to lifestyle choices and visits with your health care provider that can promote health and wellness. What does preventive care include?  A yearly physical exam. This is also called an annual well check.  Dental exams once or twice a year.  Routine eye exams. Ask your health care provider how often you should have your eyes checked.  Personal lifestyle choices, including:  Daily care of your teeth and gums.  Regular physical activity.  Eating a healthy diet.  Avoiding tobacco and drug use.  Limiting alcohol use.  Practicing safe sex.  Taking low doses of aspirin every day.  Taking vitamin and mineral supplements as recommended by your health care provider. What happens during an annual well check? The services and screenings done by your health care provider during your annual well check will depend on your age, overall health,  lifestyle risk factors, and family history of disease. Counseling  Your health care provider may ask you questions about your:  Alcohol use.  Tobacco use.  Drug use.  Emotional well-being.  Home and relationship well-being.  Sexual activity.  Eating habits.  History of falls.  Memory and ability to understand (cognition).  Work and work Astronomer. Screening  You may have the following tests or measurements:  Height, weight, and BMI.  Blood pressure.  Lipid and cholesterol levels. These may be checked every 5 years, or more frequently if you are over 3 years old.  Skin check.  Lung cancer screening. You may have this screening every year starting at age 62 if you have a 30-pack-year history of smoking and currently smoke or have quit within the past 15 years.  Fecal occult blood test (FOBT) of the stool. You may have this test every year starting at age 30.  Flexible sigmoidoscopy or colonoscopy. You may have a sigmoidoscopy every 5 years or a colonoscopy every 10 years starting at age 51.  Prostate cancer screening. Recommendations will vary depending on your family history and other risks.  Hepatitis C blood test.  Hepatitis B blood test.  Sexually transmitted disease (STD) testing.  Diabetes screening. This is done by checking your blood sugar (glucose) after you have not eaten for a while (fasting). You may have this done every 1-3 years.  Abdominal aortic aneurysm (AAA) screening. You may need this if you are a current or former smoker.  Osteoporosis. You may be screened starting at age 76 if you are at high risk. Talk with your health care provider about your test results, treatment options, and if necessary, the need for more tests. Vaccines  Your health care provider may recommend certain vaccines, such as:  Influenza vaccine. This is recommended every year.  Tetanus, diphtheria, and acellular pertussis (Tdap, Td) vaccine. You may need a Td booster  every 10 years.  Zoster vaccine. You may need this after age 31.  Pneumococcal 13-valent conjugate (PCV13) vaccine. One dose is recommended after age 47.  Pneumococcal polysaccharide (PPSV23) vaccine. One dose is recommended after age 60. Talk to your health care provider about which screenings and vaccines you need and how often you need them. This information is not intended to replace advice given to you by your health care provider. Make sure you discuss any questions you have with your health care provider. Document Released: 12/18/2015 Document Revised: 08/10/2016 Document Reviewed: 09/22/2015 Elsevier Interactive Patient Education  2017 [place] Prevention in the Home Falls can cause injuries. They can happen to people of all ages. There are many things you can do to make your home safe and to help prevent falls. What can I do on the outside of my home?  Regularly fix the edges of walkways and driveways and fix any cracks.  Remove anything that might make you trip as you walk through a door, such as a raised step or threshold.  Trim any bushes or trees on the path to your home.  Use bright outdoor lighting.  Clear any walking paths of anything that might make someone trip, such as rocks or tools.  Regularly check to see if handrails are loose or broken. Make sure that both sides of any steps have handrails.  Any raised decks and porches should have guardrails on the edges.  Have any leaves, snow, or ice cleared regularly.  Use sand or salt on walking paths during winter.  Clean up any spills in your garage right away. This includes oil or grease spills. What can I do in the bathroom?  Use night lights.  Install grab bars by the toilet and in the tub and shower. Do not use towel bars as grab bars.  Use non-skid mats or decals in the tub or shower.  If you need to sit down in the shower, use a plastic, non-slip stool.  Keep the floor dry. Clean up any  water that spills on the floor as soon as it happens.  Remove soap buildup in the tub or shower regularly.  Attach bath mats securely with double-sided non-slip rug tape.  Do not have throw rugs and other things on the floor that can make you trip. What can I do in the bedroom?  Use night lights.  Make sure that you have a light by your bed that is easy to reach.  Do not use any sheets or blankets that are too big for your bed. They should not hang down onto the floor.  Have a firm chair that has side arms. You can use this for support while you get dressed.  Do not have throw rugs and other things on the floor that can make you trip. What can I do in the kitchen?  Clean up any spills right away.  Avoid walking on wet floors.  Keep items that you use a lot in easy-to-reach places.  If you need to reach something above you, use a strong step stool that has a grab bar.  Keep electrical cords out of the way.  Do not use floor polish or wax that makes floors slippery. If you must use wax, use non-skid floor wax.  Do  not have throw rugs and other things on the floor that can make you trip. What can I do with my stairs?  Do not leave any items on the stairs.  Make sure that there are handrails on both sides of the stairs and use them. Fix handrails that are broken or loose. Make sure that handrails are as long as the stairways.  Check any carpeting to make sure that it is firmly attached to the stairs. Fix any carpet that is loose or worn.  Avoid having throw rugs at the top or bottom of the stairs. If you do have throw rugs, attach them to the floor with carpet tape.  Make sure that you have a light switch at the top of the stairs and the bottom of the stairs. If you do not have them, ask someone to add them for you. What else can I do to help prevent falls?  Wear shoes that:  Do not have high heels.  Have rubber bottoms.  Are comfortable and fit you well.  Are closed  at the toe. Do not wear sandals.  If you use a stepladder:  Make sure that it is fully opened. Do not climb a closed stepladder.  Make sure that both sides of the stepladder are locked into place.  Ask someone to hold it for you, if possible.  Clearly mark and make sure that you can see:  Any grab bars or handrails.  First and last steps.  Where the edge of each step is.  Use tools that help you move around (mobility aids) if they are needed. These include:  Canes.  Walkers.  Scooters.  Crutches.  Turn on the lights when you go into a dark area. Replace any light bulbs as soon as they burn out.  Set up your furniture so you have a clear path. Avoid moving your furniture around.  If any of your floors are uneven, fix them.  If there are any pets around you, be aware of where they are.  Review your medicines with your doctor. Some medicines can make you feel dizzy. This can increase your chance of falling. Ask your doctor what other things that you can do to help prevent falls. This information is not intended to replace advice given to you by your health care provider. Make sure you discuss any questions you have with your health care provider. Document Released: 09/17/2009 Document Revised: 04/28/2016 Document Reviewed: 12/26/2014 Elsevier Interactive Patient Education  2017 [employer].

## 2021-04-23 ENCOUNTER — Encounter: Payer: Self-pay | Admitting: Cardiology

## 2021-04-23 ENCOUNTER — Ambulatory Visit (INDEPENDENT_AMBULATORY_CARE_PROVIDER_SITE_OTHER): Payer: Medicare Other | Admitting: Cardiology

## 2021-04-23 VITALS — BP 100/60 | HR 76 | Ht 70.0 in | Wt 282.0 lb

## 2021-04-23 DIAGNOSIS — I10 Essential (primary) hypertension: Secondary | ICD-10-CM

## 2021-04-23 DIAGNOSIS — I480 Paroxysmal atrial fibrillation: Secondary | ICD-10-CM | POA: Diagnosis not present

## 2021-04-23 DIAGNOSIS — E7800 Pure hypercholesterolemia, unspecified: Secondary | ICD-10-CM

## 2021-04-23 MED ORDER — FUROSEMIDE 20 MG PO TABS: 20.0000 mg | ORAL_TABLET | ORAL | 5 refills | Status: DC | PRN

## 2021-04-23 NOTE — Progress Notes (Signed)
Cardiology Office Note:    Date:  04/23/2021   ID:  Tom Underwood, DOB Jun 14, 1951, MRN [id number]  PCP:  Tom Cory, MD  [facility] Cardiologist:  Tom Odea, MD  [facility] Electrophysiologist:  None   Referring MD: Tom Cory, MD   Chief Complaint  Patient presents with  . Other    6 month follow up. Patient c.o retaining a little fluid. Patient states he is having a lot of BM.  Meds reviewed verbally with patient.     History of Present Illness:    Tom Underwood is a 70 y.o. 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    Patient being seen due to atrial fibrillation.  Tolerating Xarelto without adverse effects.  States having some fluid in his lower extremity, denies palpitations.  Working with physical therapy and making slow but significant progress.  Has no other concerns at this time.  Prior notes Echocardiogram 07/15/2020 normal systolic function, EF 55 to 60%.  Normal diastolic function.  Past Medical History:  Diagnosis Date  . Allergy   . Decreased libido   . Diverticulitis   . GERD (gastroesophageal reflux disease)   . HLD (hyperlipidemia)   . Hydronephrosis with renal and ureteral calculus obstruction   . Hydronephrosis with renal and ureteral calculus obstruction   . Hypertension   . Hypogonadism in male   . Rhinitis, allergic   . Sleep apnea   . Stroke (HCC)   . Ureteral stone     Past Surgical History:  Procedure Laterality Date  . EXTERNAL EAR SURGERY    . FINGER SURGERY    . KNEE SURGERY    . left eye      Current Medications: Current Meds  Medication Sig  . acetaminophen (TYLENOL) 650 MG CR tablet Take 650 mg by mouth every 8 (eight) hours as needed for pain.  Marland Kitchen amitriptyline (ELAVIL) 25 MG tablet Take 25 mg by mouth at bedtime as needed for sleep.  . busPIRone (BUSPAR) 5 MG tablet Take 1-2 tablets  (5-10 mg total) by mouth 3 (three) times daily as needed.  . Cetirizine HCl 10 MG CAPS Take 10 mg by mouth in the morning.   . Cholecalciferol (VITAMIN D) 125 MCG (5000 UT) CAPS Take 5,000 Units by mouth daily.  . furosemide (LASIX) 20 MG tablet Take 1 tablet (20 mg total) by mouth as needed. For lower extremity swelling.  Marland Kitchen glucosamine-chondroitin 500-400 MG tablet Take 2 tablets by mouth daily.   . metoprolol tartrate (LOPRESSOR) 50 MG tablet Take 1 tablet (50 mg total) by mouth 2 (two) times daily.  . Multiple Vitamin (MULTIVITAMIN) tablet Take 1 tablet by mouth daily.  Marland Kitchen omega-3 acid ethyl esters (LOVAZA) 1 g capsule Take 2 capsules (2 g total) by mouth 2 (two) times daily.  . pantoprazole (PROTONIX) 20 MG tablet Take 1 tablet (20 mg total) by mouth daily.  . rivaroxaban (XARELTO) 20 MG TABS tablet Take 1 tablet (20 mg total) by mouth daily with supper.  . rosuvastatin (CRESTOR) 20 MG tablet Take 1 tablet (20 mg total) by mouth daily.  . Saccharomyces boulardii (PROBIOTIC) 250 MG CAPS Take 1 capsule by mouth daily.  . valsartan (DIOVAN) 80 MG tablet Take 1 tablet (80 mg total) by mouth daily.  Marland Kitchen VISINE DRY EYE RELIEF 1 % SOLN Place 1 drop into both eyes as needed.     Allergies:  Penicillins   Social History   Socioeconomic History  . Marital status: Married    Spouse name: Tom Underwood   . Number of children: 2  . Years of education: Not on file  . Highest education level: Not on file  Occupational History  . Occupation: retired   Tobacco Use  . Smoking status: Former Smoker    Types: Pipe    Start date: 02/20/1973    Quit date: 02/20/1974    Years since quitting: 47.2  . Smokeless tobacco: Never Used  Vaping Use  . Vaping Use: Never used  Substance and Sexual Activity  . Alcohol use: No    Alcohol/week: 0.0 standard drinks  . Drug use: No  . Sexual activity: Yes    Partners: Female  Other Topics Concern  . Not on file  Social History Narrative  . Not on file   Social  Determinants of Health   Financial Resource Strain: Low Risk   . Difficulty of Paying Living Expenses: Not hard at all  Food Insecurity: No Food Insecurity  . Worried About Programme researcher, broadcasting/film/video in the Last Year: Never true  . Ran Out of Food in the Last Year: Never true  Transportation Needs: No Transportation Needs  . Lack of Transportation (Medical): No  . Lack of Transportation (Non-Medical): No  Physical Activity: Insufficiently Active  . Days of Exercise per Week: 4 days  . Minutes of Exercise per Session: 10 min  Stress: Stress Concern Present  . Feeling of Stress : To some extent  Social Connections: Socially Integrated  . Frequency of Communication with Friends and Family: More than three times a week  . Frequency of Social Gatherings with Friends and Family: Twice a week  . Attends Religious Services: More than 4 times per year  . Active Member of Clubs or Organizations: Yes  . Attends Banker Meetings: More than 4 times per year  . Marital Status: Married     Family History: The patient's family history includes Asthma in his father and mother; Dementia in his mother; Heart attack in his mother; Heart disease in his father and mother; Stroke in his father.  ROS:   Please see the history of present illness.     All other systems reviewed and are negative.  EKGs/Labs/Other Studies Reviewed:    The following studies were reviewed today:   EKG:  EKG was ordered today.  EKG shows atrial fibrillation, heart rate 76  Recent Labs: 07/14/2020: ALT 26; B Natriuretic Peptide 160.8; Magnesium 2.0; TSH 2.986 07/17/2020: BUN 17; Creatinine, Ser 0.87; Hemoglobin 15.4; Platelets 275; Potassium 3.6; Sodium 138  Recent Lipid Panel    Component Value Date/Time   CHOL 135 05/18/2020 1126   TRIG 49 05/18/2020 1126   HDL 54 05/18/2020 1126   CHOLHDL 2.5 05/18/2020 1126   LDLCALC 70 05/18/2020 1126     Risk Assessment/Calculations:      Physical Exam:    VS:  BP  100/60 (BP Location: Right Arm, Patient Position: Sitting, Cuff Size: Normal)   Pulse 76   Ht 5\' 10"  (1.778 m)   Wt 282 lb (127.9 kg)   SpO2 98%   BMI 40.46 kg/m     Wt Readings from Last 3 Encounters:  04/23/21 282 lb (127.9 kg)  04/22/21 283 lb 11.2 oz (128.7 kg)  11/10/20 290 lb 1.6 oz (131.6 kg)     GEN:  Well nourished, well developed in no acute distress HEENT: Normal NECK: No JVD;  No carotid bruits LYMPHATICS: No lymphadenopathy CARDIAC: Irregular irregular, no murmurs, rubs, gallops RESPIRATORY:  Clear to auscultation without rales, wheezing or rhonchi  ABDOMEN: Soft, non-tender, non-distended MUSCULOSKELETAL: Trace edema; No deformity  SKIN: Warm and dry NEUROLOGIC:  Alert and oriented x 3 PSYCHIATRIC:  Normal affect   ASSESSMENT:    1. PAF (paroxysmal atrial fibrillation) (HCC)   2. Essential hypertension   3. Pure hypercholesterolemia    PLAN:    In order of problems listed above:  1. History of paroxysmal atrial fibrillation, currently in atrial fibrillation.CHA2DS2-VASc score 2 (Age, htn). continue Xarelto 20 mg daily, Lopressor 50 mg twice daily.  Echo 07/2020 showed normal systolic function, EF 55 to 60%.  Start Lasix 20 mg daily as needed to help with edema.  Leg raising when in seated position advised. 2. History of hypertension, BP controlled.  Continue Lopressor, valsartan. 3. History of hyperlipidemia, cholesterol controlled, continue statin.  Follow-up in 6 months.   Medication Adjustments/Labs and Tests Ordered: Current medicines are reviewed at length with the patient today.  Concerns regarding medicines are outlined above.  Orders Placed This Encounter  Procedures  . EKG 12-Lead   Meds ordered this encounter  Medications  . furosemide (LASIX) 20 MG tablet    Sig: Take 1 tablet (20 mg total) by mouth as needed. For lower extremity swelling.    Dispense:  30 tablet    Refill:  5    Patient Instructions  Medication Instructions:   Your  physician has recommended you make the following change in your medication:   START taking Lasix 20 MG one tablet daily as needed for lower extremity swelling.  *If you need a refill on your cardiac medications before your next appointment, please call your pharmacy*   Lab Work: None ordered If you have labs (blood work) drawn today and your tests are completely normal, you will receive your results only by: Marland Kitchen MyChart Message (if you have MyChart) OR . A paper copy in the mail If you have any lab test that is abnormal or we need to change your treatment, we will call you to review the results.   Testing/Procedures: None ordered   Follow-Up: At [facility], you and your health needs are our priority.  As part of our continuing mission to provide you with exceptional heart care, we have created designated Provider Care Teams.  These Care Teams include your primary Cardiologist (physician) and Advanced Practice Providers (APPs -  Physician Assistants and Nurse Practitioners) who all work together to provide you with the care you need, when you need it.  We recommend signing up for the patient portal called "MyChart".  Sign up information is provided on this After Visit Summary.  MyChart is used to connect with patients for Virtual Visits (Telemedicine).  Patients are able to view lab/test results, encounter notes, upcoming appointments, etc.  Non-urgent messages can be sent to your provider as well.   To learn more about what you can do with MyChart, go to [email or web address].    Your next appointment:   6 month(s)  The format for your next appointment:   In Person  Provider:   Debbe Odea, MD   Other Instructions      Signed, Tom Odea, MD  04/23/2021 12:35 PM    [facility]

## 2021-04-23 NOTE — Patient Instructions (Signed)
Medication Instructions:   Your physician has recommended you make the following change in your medication:   START taking Lasix 20 MG one tablet daily as needed for lower extremity swelling.  *If you need a refill on your cardiac medications before your next appointment, please call your pharmacy*   Lab Work: None ordered If you have labs (blood work) drawn today and your tests are completely normal, you will receive your results only by: Marland Kitchen MyChart Message (if you have MyChart) OR . A paper copy in the mail If you have any lab test that is abnormal or we need to change your treatment, we will call you to review the results.   Testing/Procedures: None ordered   Follow-Up: At [facility], you and your health needs are our priority.  As part of our continuing mission to provide you with exceptional heart care, we have created designated Provider Care Teams.  These Care Teams include your primary Cardiologist (physician) and Advanced Practice Providers (APPs -  Physician Assistants and Nurse Practitioners) who all work together to provide you with the care you need, when you need it.  We recommend signing up for the patient portal called "MyChart".  Sign up information is provided on this After Visit Summary.  MyChart is used to connect with patients for Virtual Visits (Telemedicine).  Patients are able to view lab/test results, encounter notes, upcoming appointments, etc.  Non-urgent messages can be sent to your provider as well.   To learn more about what you can do with MyChart, go to [email or web address].    Your next appointment:   6 month(s)  The format for your next appointment:   In Person  Provider:   Debbe Odea, MD   Other Instructions

## 2021-04-25 ENCOUNTER — Other Ambulatory Visit: Payer: Self-pay | Admitting: Family Medicine

## 2021-04-25 NOTE — Telephone Encounter (Signed)
Requested medication (s) are due for refill today: -  Requested medication (s) are on the active medication list: historical med  Last refill:  07/15/20  Future visit scheduled: yes  Notes to clinic:  historical provider   Requested Prescriptions  Pending Prescriptions Disp Refills   amitriptyline (ELAVIL) 25 MG tablet [Pharmacy Med Name: Amitriptyline HCl 25 MG Oral Tablet] 90 tablet 0    Sig: TAKE 1 TABLET BY MOUTH AT BEDTIME AS NEEDED FOR SLEEP      Psychiatry:  Antidepressants - Heterocyclics (TCAs) Passed - 04/25/2021  1:44 PM      Passed - Valid encounter within last 6 months    Recent Outpatient Visits           5 months ago Atrial fibrillation with RVR [facility])   [facility] Alba Cory, MD   8 months ago Cough   [facility] Danelle Berry, PA-C   8 months ago Encounter for examination following treatment at hospital   [facility] Welford Roche D, MD   11 months ago Hemiparesis of right dominant side as late effect of cerebral infarction [facility])   [facility] [place], Danna Hefty, MD   1 year ago Hemiparesis of right dominant side as late effect of cerebral infarction [facility])   [facility] Danelle Berry, PA-C       Future Appointments             In 3 weeks Alba Cory, MD [facility]   In 2 months [place], Elana [facility]   In 1 year  [facility]

## 2021-04-26 NOTE — Telephone Encounter (Signed)
Pt has an appt on 05/17/21 

## 2021-04-27 ENCOUNTER — Other Ambulatory Visit: Payer: Self-pay

## 2021-04-27 ENCOUNTER — Ambulatory Visit: Payer: Medicare Other | Admitting: Physical Therapy

## 2021-04-27 ENCOUNTER — Encounter: Payer: Self-pay | Admitting: Physical Therapy

## 2021-04-27 DIAGNOSIS — M6281 Muscle weakness (generalized): Secondary | ICD-10-CM

## 2021-04-27 DIAGNOSIS — G8929 Other chronic pain: Secondary | ICD-10-CM | POA: Diagnosis not present

## 2021-04-27 DIAGNOSIS — R2689 Other abnormalities of gait and mobility: Secondary | ICD-10-CM

## 2021-04-27 DIAGNOSIS — M25511 Pain in right shoulder: Secondary | ICD-10-CM | POA: Diagnosis not present

## 2021-04-27 DIAGNOSIS — R279 Unspecified lack of coordination: Secondary | ICD-10-CM

## 2021-04-27 DIAGNOSIS — M25611 Stiffness of right shoulder, not elsewhere classified: Secondary | ICD-10-CM

## 2021-04-27 DIAGNOSIS — M7502 Adhesive capsulitis of left shoulder: Secondary | ICD-10-CM | POA: Diagnosis not present

## 2021-04-27 DIAGNOSIS — M7501 Adhesive capsulitis of right shoulder: Secondary | ICD-10-CM
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
## 2021-05-04 ENCOUNTER — Ambulatory Visit: Payer: Medicare Other | Admitting: Physical Therapy

## 2021-05-04 ENCOUNTER — Other Ambulatory Visit: Payer: Self-pay

## 2021-05-04 ENCOUNTER — Encounter: Payer: Self-pay | Admitting: Physical Therapy

## 2021-05-04 DIAGNOSIS — M7501 Adhesive capsulitis of right shoulder: Secondary | ICD-10-CM

## 2021-05-04 DIAGNOSIS — M25611 Stiffness of right shoulder, not elsewhere classified: Secondary | ICD-10-CM | POA: Diagnosis not present

## 2021-05-04 DIAGNOSIS — G8929 Other chronic pain: Secondary | ICD-10-CM | POA: Diagnosis not present

## 2021-05-04 DIAGNOSIS — R279 Unspecified lack of coordination: Secondary | ICD-10-CM

## 2021-05-04 DIAGNOSIS — M7502 Adhesive capsulitis of left shoulder: Secondary | ICD-10-CM

## 2021-05-04 DIAGNOSIS — M6281 Muscle weakness (generalized): Secondary | ICD-10-CM | POA: Diagnosis not present

## 2021-05-04 DIAGNOSIS — M25511 Pain in right shoulder: Secondary | ICD-10-CM | POA: Diagnosis not present

## 2021-05-04 DIAGNOSIS — R2689 Other abnormalities of gait and mobility: Secondary | ICD-10-CM
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
## 2021-05-11 ENCOUNTER — Ambulatory Visit: Payer: PRIVATE HEALTH INSURANCE | Admitting: Family Medicine

## 2021-05-11 ENCOUNTER — Other Ambulatory Visit: Payer: Self-pay

## 2021-05-11 ENCOUNTER — Encounter: Payer: Self-pay | Admitting: Physical Therapy

## 2021-05-11 ENCOUNTER — Ambulatory Visit: Payer: Medicare Other | Attending: Neurology | Admitting: Physical Therapy

## 2021-05-11 DIAGNOSIS — R2689 Other abnormalities of gait and mobility: Secondary | ICD-10-CM | POA: Diagnosis not present

## 2021-05-11 DIAGNOSIS — R279 Unspecified lack of coordination: Secondary | ICD-10-CM | POA: Diagnosis not present

## 2021-05-11 DIAGNOSIS — G8929 Other chronic pain: Secondary | ICD-10-CM | POA: Insufficient documentation

## 2021-05-11 DIAGNOSIS — M7502 Adhesive capsulitis of left shoulder: Secondary | ICD-10-CM | POA: Diagnosis not present

## 2021-05-11 DIAGNOSIS — M6281 Muscle weakness (generalized): Secondary | ICD-10-CM | POA: Diagnosis not present

## 2021-05-11 DIAGNOSIS — M7501 Adhesive capsulitis of right shoulder: Secondary | ICD-10-CM | POA: Diagnosis not present

## 2021-05-11 DIAGNOSIS — M25611 Stiffness of right shoulder, not elsewhere classified: Secondary | ICD-10-CM | POA: Insufficient documentation

## 2021-05-11 DIAGNOSIS — M25511 Pain in right shoulder: Secondary | ICD-10-CM | POA: Diagnosis not present
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
## 2021-05-17 ENCOUNTER — Ambulatory Visit (INDEPENDENT_AMBULATORY_CARE_PROVIDER_SITE_OTHER): Payer: Medicare Other | Admitting: Family Medicine

## 2021-05-17 ENCOUNTER — Other Ambulatory Visit: Payer: Self-pay

## 2021-05-17 ENCOUNTER — Encounter: Payer: Self-pay | Admitting: Family Medicine

## 2021-05-17 VITALS — BP 120/64 | HR 90 | Temp 98.1°F | Resp 22 | Ht 70.0 in | Wt 288.1 lb

## 2021-05-17 DIAGNOSIS — G250 Essential tremor: Secondary | ICD-10-CM | POA: Diagnosis not present

## 2021-05-17 DIAGNOSIS — R7303 Prediabetes: Secondary | ICD-10-CM

## 2021-05-17 DIAGNOSIS — G4733 Obstructive sleep apnea (adult) (pediatric): Secondary | ICD-10-CM

## 2021-05-17 DIAGNOSIS — M170 Bilateral primary osteoarthritis of knee: Secondary | ICD-10-CM

## 2021-05-17 DIAGNOSIS — E785 Hyperlipidemia, unspecified: Secondary | ICD-10-CM

## 2021-05-17 DIAGNOSIS — I480 Paroxysmal atrial fibrillation: Secondary | ICD-10-CM | POA: Diagnosis not present

## 2021-05-17 DIAGNOSIS — F419 Anxiety disorder, unspecified: Secondary | ICD-10-CM

## 2021-05-17 DIAGNOSIS — N400 Enlarged prostate without lower urinary tract symptoms: Secondary | ICD-10-CM | POA: Diagnosis not present

## 2021-05-17 DIAGNOSIS — K219 Gastro-esophageal reflux disease without esophagitis: Secondary | ICD-10-CM | POA: Diagnosis not present

## 2021-05-17 DIAGNOSIS — I69351 Hemiplegia and hemiparesis following cerebral infarction affecting right dominant side: Secondary | ICD-10-CM | POA: Diagnosis not present

## 2021-05-17 DIAGNOSIS — E559 Vitamin D deficiency, unspecified: Secondary | ICD-10-CM

## 2021-05-17 DIAGNOSIS — F341 Dysthymic disorder: Secondary | ICD-10-CM

## 2021-05-17 DIAGNOSIS — I10 Essential (primary) hypertension: Secondary | ICD-10-CM | POA: Diagnosis not present

## 2021-05-17 DIAGNOSIS — Z79899 Other long term (current) drug therapy: Secondary | ICD-10-CM

## 2021-05-17 DIAGNOSIS — R739 Hyperglycemia, unspecified: Secondary | ICD-10-CM

## 2021-05-17 MED ORDER — ROSUVASTATIN CALCIUM 20 MG PO TABS: 20.0000 mg | ORAL_TABLET | Freq: Every day | ORAL | 1 refills | Status: DC

## 2021-05-17 MED ORDER — VALSARTAN 80 MG PO TABS
80.0000 mg | ORAL_TABLET | Freq: Every day | ORAL | 1 refills | Status: DC
Start: 2021-05-17 — End: 2021-12-13

## 2021-05-17 MED ORDER — BUSPIRONE HCL 10 MG PO TABS: 10.0000 mg | ORAL_TABLET | Freq: Two times a day (BID) | ORAL | 11 refills | Status: DC

## 2021-05-17 MED ORDER — BUPROPION HCL ER (XL) 150 MG PO TB24: 150.0000 mg | ORAL_TABLET | Freq: Every morning | ORAL | 1 refills | Status: DC

## 2021-05-17 MED ORDER — PANTOPRAZOLE SODIUM 20 MG PO TBEC: 20.0000 mg | DELAYED_RELEASE_TABLET | Freq: Every day | ORAL | 1 refills | Status: DC

## 2021-05-17 MED ORDER — VASCEPA 1 G PO CAPS: 2.0000 g | ORAL_CAPSULE | Freq: Two times a day (BID) | ORAL | 1 refills | Status: DC
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
## 2021-05-18 ENCOUNTER — Encounter: Payer: Self-pay | Admitting: Physical Therapy

## 2021-05-18 ENCOUNTER — Ambulatory Visit: Payer: Medicare Other | Admitting: Physical Therapy

## 2021-05-18 DIAGNOSIS — M25611 Stiffness of right shoulder, not elsewhere classified: Secondary | ICD-10-CM | POA: Diagnosis not present

## 2021-05-18 DIAGNOSIS — M7502 Adhesive capsulitis of left shoulder: Secondary | ICD-10-CM

## 2021-05-18 DIAGNOSIS — G8929 Other chronic pain: Secondary | ICD-10-CM

## 2021-05-18 DIAGNOSIS — M6281 Muscle weakness (generalized): Secondary | ICD-10-CM

## 2021-05-18 DIAGNOSIS — M7501 Adhesive capsulitis of right shoulder: Secondary | ICD-10-CM

## 2021-05-18 DIAGNOSIS — M25511 Pain in right shoulder: Secondary | ICD-10-CM | POA: Diagnosis not present

## 2021-05-18 DIAGNOSIS — R279 Unspecified lack of coordination: Secondary | ICD-10-CM

## 2021-05-18 DIAGNOSIS — R2689 Other abnormalities of gait and mobility: Secondary | ICD-10-CM
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
## 2021-05-20 ENCOUNTER — Other Ambulatory Visit: Payer: Self-pay | Admitting: *Deleted

## 2021-05-20 MED ORDER — METOPROLOL TARTRATE 50 MG PO TABS: 50.0000 mg | ORAL_TABLET | Freq: Two times a day (BID) | ORAL | 1 refills | Status: DC

## 2021-05-20 NOTE — Telephone Encounter (Signed)
Prescription sent pharmacy for #90 and 1 RF

## 2021-05-25 ENCOUNTER — Ambulatory Visit: Payer: Medicare Other | Admitting: Physical Therapy

## 2021-05-25 ENCOUNTER — Encounter: Payer: Self-pay | Admitting: Physical Therapy

## 2021-05-25 ENCOUNTER — Other Ambulatory Visit: Payer: Self-pay

## 2021-05-25 DIAGNOSIS — M7502 Adhesive capsulitis of left shoulder: Secondary | ICD-10-CM

## 2021-05-25 DIAGNOSIS — M25611 Stiffness of right shoulder, not elsewhere classified: Secondary | ICD-10-CM

## 2021-05-25 DIAGNOSIS — M7501 Adhesive capsulitis of right shoulder: Secondary | ICD-10-CM | POA: Diagnosis not present

## 2021-05-25 DIAGNOSIS — M25511 Pain in right shoulder: Secondary | ICD-10-CM | POA: Diagnosis not present

## 2021-05-25 DIAGNOSIS — G8929 Other chronic pain: Secondary | ICD-10-CM

## 2021-05-25 DIAGNOSIS — R2689 Other abnormalities of gait and mobility: Secondary | ICD-10-CM

## 2021-05-25 DIAGNOSIS — M6281 Muscle weakness (generalized): Secondary | ICD-10-CM

## 2021-05-25 DIAGNOSIS — R279 Unspecified lack of coordination: Secondary | ICD-10-CM
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
## 2021-05-27 ENCOUNTER — Ambulatory Visit: Payer: Self-pay | Admitting: *Deleted

## 2021-05-27 DIAGNOSIS — K5792 Diverticulitis of intestine, part unspecified, without perforation or abscess without bleeding: Secondary | ICD-10-CM | POA: Diagnosis not present

## 2021-05-27 NOTE — Telephone Encounter (Signed)
Call to patient- patient states left side lower abdominal pain started late yesterday and has not improved. Pain is positional and tender to palpitation. Patient advised UC for evaluation and can keep appointment in office Tuesday for follow up.

## 2021-05-27 NOTE — Telephone Encounter (Signed)
Pt is calling regarding a "moderate" Stomach pain that started yesterday. Pt reports no diarrhea. However, yesterday his stool was loose. Agent scheduled available appt with Dr. Carlynn Purl on Tuesday 6/ 29/22.  Reason for Disposition  [1] MILD-MODERATE pain AND [2] constant AND [3] present > 2 hours  Answer Assessment - Initial Assessment Questions 1. LOCATION: "Where does it hurt?"      Left side- lower 2. RADIATION: "Does the pain shoot anywhere else?" (e.g., chest, back)     No radiation 3. ONSET: "When did the pain begin?" (Minutes, hours or days ago)      Yesterday afternoon 4. SUDDEN: "Gradual or sudden onset?"     Gradual onset 5. PATTERN "Does the pain come and go, or is it constant?"    - If constant: "Is it getting better, staying the same, or worsening?"      (Note: Constant means the pain never goes away completely; most serious pain is constant and it progresses)     - If intermittent: "How long does it last?" "Do you have pain now?"     (Note: Intermittent means the pain goes away completely between bouts)     Comes and goes, 1-2 minutes- 6. SEVERITY: "How bad is the pain?"  (e.g., Scale 1-10; mild, moderate, or severe)    - MILD (1-3): doesn't interfere with normal activities, abdomen soft and not tender to touch     - MODERATE (4-7): interferes with normal activities or awakens from sleep, abdomen tender to touch     - SEVERE (8-10): excruciating pain, doubled over, unable to do any normal activities       moderate 7. RECURRENT SYMPTOM: "Have you ever had this type of stomach pain before?" If Yes, ask: "When was the last time?" and "What happened that time?"      no 8. CAUSE: "What do you think is causing the stomach pain?"     no 9. RELIEVING/AGGRAVATING FACTORS: "What makes it better or worse?" (e.g., movement, antacids, bowel movement)     Cold, positional- sitting up makes it worse 10. OTHER SYMPTOMS: "Do you have any other symptoms?" (e.g., back pain, diarrhea, fever,  urination pain, vomiting)       Loose stool yesterday  Protocols used: Abdominal Pain - Male-A-AH

## 2021-05-28 ENCOUNTER — Telehealth: Payer: Self-pay

## 2021-05-28 DIAGNOSIS — N400 Enlarged prostate without lower urinary tract symptoms: Secondary | ICD-10-CM | POA: Diagnosis not present

## 2021-05-28 DIAGNOSIS — R7303 Prediabetes: Secondary | ICD-10-CM | POA: Diagnosis not present

## 2021-05-28 DIAGNOSIS — G250 Essential tremor: Secondary | ICD-10-CM | POA: Diagnosis not present

## 2021-05-28 DIAGNOSIS — E559 Vitamin D deficiency, unspecified: Secondary | ICD-10-CM | POA: Diagnosis not present

## 2021-05-28 DIAGNOSIS — Z79899 Other long term (current) drug therapy: Secondary | ICD-10-CM | POA: Diagnosis not present

## 2021-05-28 DIAGNOSIS — E785 Hyperlipidemia, unspecified: Secondary | ICD-10-CM | POA: Diagnosis not present

## 2021-05-28 DIAGNOSIS — R739 Hyperglycemia, unspecified: Secondary | ICD-10-CM | POA: Diagnosis not present

## 2021-05-28 NOTE — Telephone Encounter (Signed)
Called pt with no answer and left vm advising to go to UC and not wait until 6/28. Advised to call back with any questions if needed.

## 2021-05-28 NOTE — Telephone Encounter (Signed)
Copied from CRM [id number]. Topic: Appointment Scheduling - Scheduling Inquiry for Clinic >> May 28, 2021 12:08 PM Elliot Gault wrote: Please reference 05/27/2021 Nurse Triage message, patient returning call and stated he went to [facility] clinic and was dx with diverticulitis. Patient will keep his follow up appointment with PCP on 06/01/2021

## 2021-05-29 LAB — CBC WITH DIFFERENTIAL/PLATELET
Basophils Absolute: 0 10*3/uL (ref 0.0–0.2)
Basos: 0 %
EOS (ABSOLUTE): 0.1 10*3/uL (ref 0.0–0.4)
Eos: 1 %
Hematocrit: 48.6 % (ref 37.5–51.0)
Hemoglobin: 17.2 g/dL (ref 13.0–17.7)
Immature Grans (Abs): 0 10*3/uL (ref 0.0–0.1)
Immature Granulocytes: 0 %
Lymphocytes Absolute: 1.9 10*3/uL (ref 0.7–3.1)
Lymphs: 18 %
MCH: 31.4 pg (ref 26.6–33.0)
MCHC: 35.4 g/dL (ref 31.5–35.7)
MCV: 89 fL (ref 79–97)
Monocytes Absolute: 0.9 10*3/uL (ref 0.1–0.9)
Monocytes: 8 %
Neutrophils Absolute: 7.6 10*3/uL — ABNORMAL HIGH (ref 1.4–7.0)
Neutrophils: 73 %
Platelets: 194 10*3/uL (ref 150–450)
RBC: 5.48 x10E6/uL (ref 4.14–5.80)
RDW: 11.8 % (ref 11.6–15.4)
WBC: 10.6 10*3/uL (ref 3.4–10.8)

## 2021-05-29 LAB — COMPREHENSIVE METABOLIC PANEL
ALT: 25 IU/L (ref 0–44)
AST: 19 IU/L (ref 0–40)
Albumin/Globulin Ratio: 1.7 (ref 1.2–2.2)
Albumin: 4.8 g/dL (ref 3.8–4.8)
Alkaline Phosphatase: 113 IU/L (ref 44–121)
BUN/Creatinine Ratio: 17 (ref 10–24)
BUN: 17 mg/dL (ref 8–27)
Bilirubin Total: 2.5 mg/dL — ABNORMAL HIGH (ref 0.0–1.2)
CO2: 20 mmol/L (ref 20–29)
Calcium: 9.9 mg/dL (ref 8.6–10.2)
Chloride: 103 mmol/L (ref 96–106)
Creatinine, Ser: 1.03 mg/dL (ref 0.76–1.27)
Globulin, Total: 2.8 g/dL (ref 1.5–4.5)
Glucose: 103 mg/dL — ABNORMAL HIGH (ref 65–99)
Potassium: 3.7 mmol/L (ref 3.5–5.2)
Sodium: 142 mmol/L (ref 134–144)
Total Protein: 7.6 g/dL (ref 6.0–8.5)
eGFR: 79 mL/min/{1.73_m2} (ref >59)

## 2021-05-29 LAB — LIPID PANEL
Chol/HDL Ratio: 2.4 ratio (ref 0.0–5.0)
Cholesterol, Total: 120 mg/dL (ref 100–199)
HDL: 51 mg/dL (ref >39)
LDL Chol Calc (NIH): 53 mg/dL (ref 0–99)
Triglycerides: 79 mg/dL (ref 0–149)
VLDL Cholesterol Cal: 16 mg/dL (ref 5–40)

## 2021-05-29 LAB — VITAMIN D 25 HYDROXY (VIT D DEFICIENCY, FRACTURES): Vit D, 25-Hydroxy: 64.6 ng/mL (ref 30.0–100.0)

## 2021-05-29 LAB — HEMOGLOBIN A1C
Est. average glucose Bld gHb Est-mCnc: 111 mg/dL
Hgb A1c MFr Bld: 5.5 % (ref 4.8–5.6)

## 2021-05-29 LAB — PSA: Prostate Specific Ag, Serum: 0.3 ng/mL (ref 0.0–4.0)

## 2021-05-29 LAB — TSH: TSH: 2.53 u[IU]/mL (ref 0.450–4.500)

## 2021-05-31 ENCOUNTER — Other Ambulatory Visit: Payer: Self-pay

## 2021-05-31 ENCOUNTER — Ambulatory Visit: Payer: Medicare Other

## 2021-05-31 ENCOUNTER — Encounter: Payer: Self-pay | Admitting: Physical Therapy

## 2021-05-31 DIAGNOSIS — M25511 Pain in right shoulder: Secondary | ICD-10-CM

## 2021-05-31 DIAGNOSIS — G8929 Other chronic pain: Secondary | ICD-10-CM | POA: Diagnosis not present

## 2021-05-31 DIAGNOSIS — R2689 Other abnormalities of gait and mobility: Secondary | ICD-10-CM

## 2021-05-31 DIAGNOSIS — R279 Unspecified lack of coordination: Secondary | ICD-10-CM

## 2021-05-31 DIAGNOSIS — M25611 Stiffness of right shoulder, not elsewhere classified: Secondary | ICD-10-CM | POA: Diagnosis not present

## 2021-05-31 DIAGNOSIS — M6281 Muscle weakness (generalized): Secondary | ICD-10-CM

## 2021-05-31 DIAGNOSIS — M7502 Adhesive capsulitis of left shoulder: Secondary | ICD-10-CM | POA: Diagnosis not present

## 2021-05-31 DIAGNOSIS — M7501 Adhesive capsulitis of right shoulder: Secondary | ICD-10-CM
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
## 2021-06-01 ENCOUNTER — Encounter: Payer: Medicare Other | Admitting: Physical Therapy

## 2021-06-01 ENCOUNTER — Ambulatory Visit (INDEPENDENT_AMBULATORY_CARE_PROVIDER_SITE_OTHER): Payer: Medicare Other | Admitting: Family Medicine

## 2021-06-01 ENCOUNTER — Encounter: Payer: Self-pay | Admitting: Family Medicine

## 2021-06-01 VITALS — BP 132/84 | HR 100 | Temp 98.2°F | Resp 16 | Ht 70.0 in | Wt 279.0 lb

## 2021-06-01 DIAGNOSIS — E785 Hyperlipidemia, unspecified: Secondary | ICD-10-CM

## 2021-06-01 DIAGNOSIS — Z9989 Dependence on other enabling machines and devices: Secondary | ICD-10-CM | POA: Diagnosis not present

## 2021-06-01 DIAGNOSIS — G4733 Obstructive sleep apnea (adult) (pediatric): Secondary | ICD-10-CM | POA: Diagnosis not present

## 2021-06-01 DIAGNOSIS — K5792 Diverticulitis of intestine, part unspecified, without perforation or abscess without bleeding: Secondary | ICD-10-CM | POA: Diagnosis not present

## 2021-06-01 MED ORDER — OMEGA-3-ACID ETHYL ESTERS 1 G PO CAPS: 2.0000 g | ORAL_CAPSULE | Freq: Two times a day (BID) | ORAL | 1 refills | Status: DC

## 2021-06-10 ENCOUNTER — Other Ambulatory Visit: Payer: Self-pay

## 2021-06-10 ENCOUNTER — Encounter: Payer: Self-pay | Admitting: Physical Therapy

## 2021-06-10 ENCOUNTER — Ambulatory Visit: Payer: Medicare Other | Attending: Neurology

## 2021-06-10 DIAGNOSIS — R2689 Other abnormalities of gait and mobility: Secondary | ICD-10-CM | POA: Insufficient documentation

## 2021-06-10 DIAGNOSIS — M7502 Adhesive capsulitis of left shoulder: Secondary | ICD-10-CM | POA: Diagnosis not present

## 2021-06-10 DIAGNOSIS — R279 Unspecified lack of coordination: Secondary | ICD-10-CM

## 2021-06-10 DIAGNOSIS — G8929 Other chronic pain: Secondary | ICD-10-CM | POA: Insufficient documentation

## 2021-06-10 DIAGNOSIS — M25511 Pain in right shoulder: Secondary | ICD-10-CM | POA: Diagnosis not present

## 2021-06-10 DIAGNOSIS — M7501 Adhesive capsulitis of right shoulder: Secondary | ICD-10-CM | POA: Insufficient documentation

## 2021-06-10 DIAGNOSIS — M6281 Muscle weakness (generalized): Secondary | ICD-10-CM | POA: Diagnosis not present

## 2021-06-10 DIAGNOSIS — M25611 Stiffness of right shoulder, not elsewhere classified: Secondary | ICD-10-CM | POA: Insufficient documentation
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
## 2021-06-15 ENCOUNTER — Telehealth: Payer: Self-pay

## 2021-06-15 NOTE — Progress Notes (Signed)
Chronic Care Management Pharmacy Assistant   Name: Tom Underwood  MRN: [id number] DOB: [birth year]  Reason for Encounter: Medication Review/General Adherence Call.   Recent office visits:  06/01/2021 Dr Carlynn Purl MD (PCP) Start omega-3 acid ethyl esters (LOVAZA) 1 g capsule; Take 2 capsules (2 g total) by mouth 2 (two) times daily, unable to take Vascepa due to the cost 05/17/2021 Dr. Carlynn Purl MD (PCP) Start Vascepa 1 g Capsule, take 2 capsule 2 times daily, Start bupropion 150 mg 1 tablet daily.Change Buspirone 10 mg from prn to 1 tablet 2 time daily  Recent consult visits:  06/10/2021 Ronnie Derby PT (Physical Therapy) No medication change noted. 06/01/2021 Alvin Critchley NP (Neurology) No medication Change Noted 05/31/2021 Alvera Novel PT (Physical Therapy) No medication Change Noted 05/25/2021 Dorene Grebe PT (Physical Therapy) No medication change noted 05/18/2021 Dorene Grebe PT (Physical Therapy) No medication change noted 05/11/2021 Dorene Grebe PT (Physical Therapy) No medication change noted 05/04/2021 Dorene Grebe PT (Physical Therapy) No medication change noted 04/27/2021 Dorene Grebe PT (Physical Therapy) No medication change noted 04/23/2021 Dr. Azucena Cecil MD (Cardiology)Start Lasix 20 mg daily as needed to help with edema  Hospital visits:  Medication Reconciliation was completed by comparing discharge summary, patient's EMR and Pharmacy list, and upon discussion with patient.  Admitted to the hospital on 05/27/2021 due to Diverticulitis . Discharge date was 05/27/2021. Discharged from [facility].    New?Medications Started at [facility] Discharge:?? -started ciprofloxacin HCl (CIPRO) 500 MG tablet; Take 1 tablet (500 mg total) by mouth 2 (two) times daily for 7 days - metroNIDAZOLE (FLAGYL) 500 MG tablet; Take 1 tablet (500 mg total) by mouth 3 (three) times daily for 7 days  Medication Changes at Hospital Discharge: -Changed None  ID  Medications Discontinued at Hospital Discharge: -Stopped None ID   Medications that remain the same after Hospital Discharge:??  -All other medications will remain the same.    Medications: Outpatient Encounter Medications as of 06/15/2021  Medication Sig   acetaminophen (TYLENOL) 650 MG CR tablet Take 650 mg by mouth every 8 (eight) hours as needed for pain.   amitriptyline (ELAVIL) 25 MG tablet TAKE 1 TABLET BY MOUTH AT BEDTIME AS NEEDED FOR SLEEP   buPROPion (WELLBUTRIN XL) 150 MG 24 hr tablet Take 1 tablet (150 mg total) by mouth in the morning.   busPIRone (BUSPAR) 10 MG tablet Take 1 tablet (10 mg total) by mouth 2 (two) times daily.   Cetirizine HCl 10 MG CAPS Take 10 mg by mouth in the morning.    Cholecalciferol (VITAMIN D) 125 MCG (5000 UT) CAPS Take 5,000 Units by mouth daily.   furosemide (LASIX) 20 MG tablet Take 1 tablet (20 mg total) by mouth as needed. For lower extremity swelling.   glucosamine-chondroitin 500-400 MG tablet Take 2 tablets by mouth daily.    metoprolol tartrate (LOPRESSOR) 50 MG tablet Take 1 tablet (50 mg total) by mouth 2 (two) times daily.   Multiple Vitamin (MULTIVITAMIN) tablet Take 1 tablet by mouth daily.   omega-3 acid ethyl esters (LOVAZA) 1 g capsule Take 2 capsules (2 g total) by mouth 2 (two) times daily.   pantoprazole (PROTONIX) 20 MG tablet Take 1 tablet (20 mg total) by mouth daily.   rivaroxaban (XARELTO) 20 MG TABS tablet Take 1 tablet (20 mg total) by mouth daily with supper.   rosuvastatin (CRESTOR) 20 MG tablet Take 1 tablet (20 mg total) by mouth daily.   Saccharomyces boulardii (PROBIOTIC) 250  MG CAPS Take 1 capsule by mouth daily.   valsartan (DIOVAN) 80 MG tablet Take 1 tablet (80 mg total) by mouth daily.   VISINE DRY EYE RELIEF 1 % SOLN Place 1 drop into both eyes as needed.   No facility-administered encounter medications on file as of 06/15/2021.    Care Gaps: Covid 19 Vaccine Star Rating Drugs: Rosuvastatin 20 mg last  filled on 10/27/2020 for 90 day supply at [facility]. Valsartan 80 mg last filled on 10/10/2020 for 90 day supply at [facility]. Medication Fill Gaps Buspirone last filled 11/11/2020 for 15 days at [facility]. Bupropion (WELLBUTRIN XL) 150 MG 24 hr tablet Not on fill History furosemide (LASIX) 20 MG tablet Not on fill History glucosamine-chondroitin 500-400 MG tablet Not on fill History Metoprolol Tartrate 50 mg last filled 11/11/2020 for 90 day supply at [facility]. Omega-3 (Lovaza) 1 g last filled on 12/04/2020 for 90 day supply at [facility]. pantoprazole (PROTONIX) 20 MG tablet last filled on 11/10/2020 for 90 day supply at [facility]. rivaroxaban (XARELTO) 20 MG TABS tablet last filled on 08/07/2020 for 30 day supply.  Per Clinical Pharmacist,Please  reschedule patient telephone appointment on 06/16/2021 for CCM at 10:30 am for CCM services to  07/22/2021 at 10:30 am. Patient states that would be okay with him to change his appointment. Sent Message to [employer].   Called patient and discussed medication adherence  with patient, no issues at this time with current medication.   Patient denies ED visit since his last CPP follow up.  Patient denies any side effects with his medication. Patient denies any problems with his current pharmacy Patient states he takes all his medication as prescribe without missing a dose.  Everlean Cherry Clinical Pharmacist Assistant [phone]

## 2021-06-16 ENCOUNTER — Other Ambulatory Visit: Payer: Self-pay

## 2021-06-16 ENCOUNTER — Telehealth: Payer: Self-pay

## 2021-06-16 ENCOUNTER — Ambulatory Visit: Payer: Medicare Other | Admitting: Physical Therapy

## 2021-06-16 ENCOUNTER — Encounter: Payer: Self-pay | Admitting: Physical Therapy

## 2021-06-16 DIAGNOSIS — M7501 Adhesive capsulitis of right shoulder: Secondary | ICD-10-CM

## 2021-06-16 DIAGNOSIS — R2689 Other abnormalities of gait and mobility: Secondary | ICD-10-CM

## 2021-06-16 DIAGNOSIS — M7502 Adhesive capsulitis of left shoulder: Secondary | ICD-10-CM

## 2021-06-16 DIAGNOSIS — M25511 Pain in right shoulder: Secondary | ICD-10-CM | POA: Diagnosis not present

## 2021-06-16 DIAGNOSIS — G8929 Other chronic pain: Secondary | ICD-10-CM

## 2021-06-16 DIAGNOSIS — R279 Unspecified lack of coordination: Secondary | ICD-10-CM

## 2021-06-16 DIAGNOSIS — M6281 Muscle weakness (generalized): Secondary | ICD-10-CM | POA: Diagnosis not present

## 2021-06-16 DIAGNOSIS — M25611 Stiffness of right shoulder, not elsewhere classified: Secondary | ICD-10-CM | POA: Diagnosis not present
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
## 2021-06-23 ENCOUNTER — Encounter: Payer: Self-pay | Admitting: Physical Therapy

## 2021-06-23 ENCOUNTER — Ambulatory Visit: Payer: Medicare Other | Admitting: Physical Therapy

## 2021-06-23 ENCOUNTER — Other Ambulatory Visit: Payer: Self-pay

## 2021-06-23 DIAGNOSIS — G8929 Other chronic pain: Secondary | ICD-10-CM | POA: Diagnosis not present

## 2021-06-23 DIAGNOSIS — M7502 Adhesive capsulitis of left shoulder: Secondary | ICD-10-CM | POA: Diagnosis not present

## 2021-06-23 DIAGNOSIS — M25611 Stiffness of right shoulder, not elsewhere classified: Secondary | ICD-10-CM

## 2021-06-23 DIAGNOSIS — R279 Unspecified lack of coordination: Secondary | ICD-10-CM

## 2021-06-23 DIAGNOSIS — R2689 Other abnormalities of gait and mobility: Secondary | ICD-10-CM

## 2021-06-23 DIAGNOSIS — M6281 Muscle weakness (generalized): Secondary | ICD-10-CM | POA: Diagnosis not present

## 2021-06-23 DIAGNOSIS — M25511 Pain in right shoulder: Secondary | ICD-10-CM

## 2021-06-23 DIAGNOSIS — M7501 Adhesive capsulitis of right shoulder: Secondary | ICD-10-CM
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
## 2021-06-27 NOTE — Progress Notes (Signed)
06/28/2021 11:08 AM   Tom Underwood [birth year] [id number]  Referring provider: Alba Cory, MD [address] [place],  [place] [address]  Urological history: 1. High risk hematuria -former smoker -CTU 2017 NED -cysto 2017 NED -no reports of gross hematuria  2. Nephrolithiasis -spontaneous passage of a left UPJ stone in 2014 -1 mm stone in the left kidney 2017 CTU  3. BPH with LU TS -PSA 0.6 in 05/2021 -I PSS 0/1  4. ED -contributing factors of age, BPH, HTN, HLD, stroke and sleep apnea -SHIM 5    Chief Complaint  Patient presents with   Follow-up    1 year follow-up     HPI: Tom Underwood is 70 y.o. male who presents today for yearly appointment.  He has an occasional episode of urge.  He drinks a lot of water at home.  Patient denies any modifying or aggravating factors.  Patient denies any gross hematuria, dysuria or suprapubic/flank pain.  Patient denies any fevers, chills, nausea or vomiting.       IPSS     Row Name 06/28/21 1000         International Prostate Symptom Score   How often have you had the sensation of not emptying your bladder? Not at All     How often have you had to urinate less than every two hours? Not at All     How often have you found you stopped and started again several times when you urinated? Not at All     How often have you found it difficult to postpone urination? Not at All     How often have you had a weak urinary stream? Not at All     How often have you had to strain to start urination? Not at All     How many times did you typically get up at night to urinate? None     Total IPSS Score 0           Quality of Life due to urinary symptoms     If you were to spend the rest of your life with your urinary condition just the way it is now how would you feel about that? Pleased              Score:  1-7 Mild 8-19 Moderate 20-35 Severe    Patient is not having spontaneous erections.    He denies any pain or curvature with erections.     SHIM     Row Name 06/28/21 1054         SHIM: Over the last 6 months:   How do you rate your confidence that you could get and keep an erection? Very Low     When you had erections with sexual stimulation, how often were your erections hard enough for penetration (entering your partner)? Almost Never or Never     During sexual intercourse, how often were you able to maintain your erection after you had penetrated (entered) your partner? Almost Never or Never     During sexual intercourse, how difficult was it to maintain your erection to completion of intercourse? Extremely Difficult     When you attempted sexual intercourse, how often was it satisfactory for you? Almost Never or Never           SHIM Total Score     SHIM 5             Score: 1-7 Severe  ED 8-11 Moderate ED 12-16 Mild-Moderate ED 17-21 Mild ED 22-25 No ED   PMH: Past Medical History:  Diagnosis Date   Allergy    Decreased libido    Diverticulitis    GERD (gastroesophageal reflux disease)    HLD (hyperlipidemia)    Hydronephrosis with renal and ureteral calculus obstruction    Hydronephrosis with renal and ureteral calculus obstruction    Hypertension    Hypogonadism in male    Rhinitis, allergic    Sleep apnea    Stroke [facility])    Ureteral stone     Surgical History: Past Surgical History:  Procedure Laterality Date   EXTERNAL EAR SURGERY     FINGER SURGERY     KNEE SURGERY     left eye      Home Medications:  Allergies as of 06/28/2021       Reactions   Penicillins Itching        Medication List        Accurate as of June 28, 2021 11:08 AM. If you have any questions, ask your nurse or doctor.          acetaminophen 650 MG CR tablet Commonly known as: TYLENOL Take 650 mg by mouth every 8 (eight) hours as needed for pain.   amitriptyline 25 MG tablet Commonly known as: ELAVIL TAKE 1 TABLET BY MOUTH AT BEDTIME AS NEEDED FOR  SLEEP   buPROPion 150 MG 24 hr tablet Commonly known as: Wellbutrin XL Take 1 tablet (150 mg total) by mouth in the morning.   busPIRone 10 MG tablet Commonly known as: BUSPAR Take 1 tablet (10 mg total) by mouth 2 (two) times daily.   Cetirizine HCl 10 MG Caps Take 10 mg by mouth in the morning.   furosemide 20 MG tablet Commonly known as: LASIX Take 1 tablet (20 mg total) by mouth as needed. For lower extremity swelling.   glucosamine-chondroitin 500-400 MG tablet Take 2 tablets by mouth daily.   metoprolol tartrate 50 MG tablet Commonly known as: LOPRESSOR Take 1 tablet (50 mg total) by mouth 2 (two) times daily.   multivitamin tablet Take 1 tablet by mouth daily.   omega-3 acid ethyl esters 1 g capsule Commonly known as: LOVAZA Take 2 capsules (2 g total) by mouth 2 (two) times daily.   pantoprazole 20 MG tablet Commonly known as: PROTONIX Take 1 tablet (20 mg total) by mouth daily.   Probiotic 250 MG Caps Take 1 capsule by mouth daily.   rivaroxaban 20 MG Tabs tablet Commonly known as: XARELTO Take 1 tablet (20 mg total) by mouth daily with supper.   rosuvastatin 20 MG tablet Commonly known as: CRESTOR Take 1 tablet (20 mg total) by mouth daily.   valsartan 80 MG tablet Commonly known as: DIOVAN Take 1 tablet (80 mg total) by mouth daily.   Visine Dry Eye Relief 1 % Soln Generic drug: Polyethylene Glycol 400 Place 1 drop into both eyes as needed.   Vitamin D 125 MCG (5000 UT) Caps Take 5,000 Units by mouth daily.        Allergies:  Allergies  Allergen Reactions   Penicillins Itching    Family History: Family History  Problem Relation Age of Onset   Asthma Mother    Heart disease Mother    Heart attack Mother    Dementia Mother    Asthma Father    Heart disease Father    Stroke Father     Social History:  reports  that he quit smoking about 47 years ago. His smoking use included pipe. He started smoking about 48 years ago. He has never  used smokeless tobacco. He reports that he does not drink alcohol and does not use drugs.  ROS: For pertinent review of systems please refer to history of present illness  Physical Exam: BP 131/82   Pulse 89   Ht 5\' 11"  (1.803 m)   Wt 282 lb (127.9 kg)   BMI 39.33 kg/m   Constitutional:  Well nourished. Alert and oriented, No acute distress. HEENT: [place] AT, mask in place  Trachea midline Cardiovascular: No clubbing, cyanosis, or edema. Respiratory: Normal respiratory effort, no increased work of breathing. GU: No CVA tenderness.  No bladder fullness or masses.  Patient with uncircumcised phallus. Foreskin easily retracted  Urethral meatus is patent.  No penile discharge. No penile lesions or rashes. Scrotum without lesions, cysts, rashes and/or edema.  Testicles are located scrotally bilaterally. No masses are appreciated in the testicles. Left and right epididymis are normal. Rectal: Patient with  normal sphincter tone. Anus and perineum without scarring or rashes. No rectal masses are appreciated. Prostate is approximately 50 grams, could only palpate the apex and midportion of the gland, no nodules are appreciated. Seminal vesicles could not be palpated Neurologic: Grossly intact, no focal deficits, moving all 4 extremities. Psychiatric: Normal mood and affect.   Laboratory Data: Lab Results  Component Value Date   CREATININE 1.03 05/28/2021    Lab Results  Component Value Date   TSH 2.530 05/28/2021       Component Value Date/Time   CHOL 120 05/28/2021 1336   HDL 51 05/28/2021 1336   CHOLHDL 2.4 05/28/2021 1336   LDLCALC 53 05/28/2021 1336    Lab Results  Component Value Date   AST 19 05/28/2021   Lab Results  Component Value Date   ALT 25 05/28/2021   Component     Latest Ref Rng & Units 05/28/2021  Prostate Specific Ag, Serum     0.0 - 4.0 ng/mL 0.3   I have reviewed the labs.  Assessment & Plan:    1. BPH  -symptoms stable -Continue conservative  management, avoiding bladder irritants and timed voiding's  2. ED -Has not tried the sildenafil at this time -not interested in sexual activity   3. Nephrolithiasis -1 mm stone in left kidney - No intervention at this time  4. History of hematuria -Completed hematuria work up in 2017 - no worrisome findings -No recent gross hematuria -UA was negative - no further screening needed as patient has had three negative fro [facility] UA's   Return in about 1 year (around 06/28/2022) for PSA, I PSS and exam .  These notes generated with voice recognition software. I apologize for typographical errors.  06/30/2022, PA-C  [facility] [address] [place], [place] [phone]

## 2021-06-28 ENCOUNTER — Other Ambulatory Visit: Payer: Self-pay

## 2021-06-28 ENCOUNTER — Encounter: Payer: Self-pay | Admitting: Urology

## 2021-06-28 ENCOUNTER — Ambulatory Visit (INDEPENDENT_AMBULATORY_CARE_PROVIDER_SITE_OTHER): Payer: Medicare Other | Admitting: Urology

## 2021-06-28 VITALS — BP 131/82 | HR 89 | Ht 71.0 in | Wt 282.0 lb

## 2021-06-28 DIAGNOSIS — N529 Male erectile dysfunction, unspecified: Secondary | ICD-10-CM | POA: Diagnosis not present

## 2021-06-28 DIAGNOSIS — R319 Hematuria, unspecified: Secondary | ICD-10-CM | POA: Diagnosis not present

## 2021-06-28 DIAGNOSIS — N400 Enlarged prostate without lower urinary tract symptoms: Secondary | ICD-10-CM

## 2021-06-28 DIAGNOSIS — N200 Calculus of kidney: Secondary | ICD-10-CM | POA: Diagnosis not present

## 2021-06-29 ENCOUNTER — Encounter: Payer: Self-pay | Admitting: Physical Therapy

## 2021-06-29 ENCOUNTER — Ambulatory Visit: Payer: Medicare Other | Admitting: Physical Therapy

## 2021-06-29 DIAGNOSIS — G8929 Other chronic pain: Secondary | ICD-10-CM | POA: Diagnosis not present

## 2021-06-29 DIAGNOSIS — M25611 Stiffness of right shoulder, not elsewhere classified: Secondary | ICD-10-CM | POA: Diagnosis not present

## 2021-06-29 DIAGNOSIS — R2689 Other abnormalities of gait and mobility: Secondary | ICD-10-CM

## 2021-06-29 DIAGNOSIS — R279 Unspecified lack of coordination: Secondary | ICD-10-CM

## 2021-06-29 DIAGNOSIS — M6281 Muscle weakness (generalized): Secondary | ICD-10-CM | POA: Diagnosis not present

## 2021-06-29 DIAGNOSIS — M7501 Adhesive capsulitis of right shoulder: Secondary | ICD-10-CM

## 2021-06-29 DIAGNOSIS — M25511 Pain in right shoulder: Secondary | ICD-10-CM

## 2021-06-29 DIAGNOSIS — M7502 Adhesive capsulitis of left shoulder: Secondary | ICD-10-CM | POA: Diagnosis not present
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
## 2021-07-07 ENCOUNTER — Other Ambulatory Visit: Payer: Self-pay

## 2021-07-07 ENCOUNTER — Encounter: Payer: Self-pay | Admitting: Physical Therapy

## 2021-07-07 ENCOUNTER — Ambulatory Visit: Payer: Medicare Other | Attending: Neurology | Admitting: Physical Therapy

## 2021-07-07 DIAGNOSIS — M25611 Stiffness of right shoulder, not elsewhere classified: Secondary | ICD-10-CM | POA: Diagnosis not present

## 2021-07-07 DIAGNOSIS — M6281 Muscle weakness (generalized): Secondary | ICD-10-CM | POA: Diagnosis not present

## 2021-07-07 DIAGNOSIS — R2689 Other abnormalities of gait and mobility: Secondary | ICD-10-CM | POA: Insufficient documentation

## 2021-07-07 DIAGNOSIS — M7501 Adhesive capsulitis of right shoulder: Secondary | ICD-10-CM | POA: Insufficient documentation

## 2021-07-07 DIAGNOSIS — M25511 Pain in right shoulder: Secondary | ICD-10-CM | POA: Diagnosis not present

## 2021-07-07 DIAGNOSIS — M7502 Adhesive capsulitis of left shoulder: Secondary | ICD-10-CM | POA: Diagnosis not present

## 2021-07-07 DIAGNOSIS — R279 Unspecified lack of coordination: Secondary | ICD-10-CM | POA: Diagnosis not present

## 2021-07-07 DIAGNOSIS — G8929 Other chronic pain: Secondary | ICD-10-CM | POA: Insufficient documentation
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
## 2021-07-14 ENCOUNTER — Other Ambulatory Visit: Payer: Self-pay

## 2021-07-14 ENCOUNTER — Ambulatory Visit: Payer: Medicare Other | Admitting: Physical Therapy

## 2021-07-14 ENCOUNTER — Encounter: Payer: Self-pay | Admitting: Physical Therapy

## 2021-07-14 DIAGNOSIS — R279 Unspecified lack of coordination: Secondary | ICD-10-CM | POA: Diagnosis not present

## 2021-07-14 DIAGNOSIS — M7501 Adhesive capsulitis of right shoulder: Secondary | ICD-10-CM | POA: Diagnosis not present

## 2021-07-14 DIAGNOSIS — M7502 Adhesive capsulitis of left shoulder: Secondary | ICD-10-CM

## 2021-07-14 DIAGNOSIS — R2689 Other abnormalities of gait and mobility: Secondary | ICD-10-CM | POA: Diagnosis not present

## 2021-07-14 DIAGNOSIS — M25611 Stiffness of right shoulder, not elsewhere classified: Secondary | ICD-10-CM

## 2021-07-14 DIAGNOSIS — M6281 Muscle weakness (generalized): Secondary | ICD-10-CM

## 2021-07-14 DIAGNOSIS — M25511 Pain in right shoulder: Secondary | ICD-10-CM

## 2021-07-14 DIAGNOSIS — G8929 Other chronic pain: Secondary | ICD-10-CM
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
## 2021-07-21 ENCOUNTER — Telehealth: Payer: Self-pay

## 2021-07-21 ENCOUNTER — Encounter: Payer: Self-pay | Admitting: Physical Therapy

## 2021-07-21 ENCOUNTER — Other Ambulatory Visit: Payer: Self-pay

## 2021-07-21 ENCOUNTER — Ambulatory Visit: Payer: Medicare Other | Admitting: Physical Therapy

## 2021-07-21 DIAGNOSIS — M7501 Adhesive capsulitis of right shoulder: Secondary | ICD-10-CM

## 2021-07-21 DIAGNOSIS — G8929 Other chronic pain: Secondary | ICD-10-CM

## 2021-07-21 DIAGNOSIS — R2689 Other abnormalities of gait and mobility: Secondary | ICD-10-CM | POA: Diagnosis not present

## 2021-07-21 DIAGNOSIS — M7502 Adhesive capsulitis of left shoulder: Secondary | ICD-10-CM | POA: Diagnosis not present

## 2021-07-21 DIAGNOSIS — M6281 Muscle weakness (generalized): Secondary | ICD-10-CM | POA: Diagnosis not present

## 2021-07-21 DIAGNOSIS — M25611 Stiffness of right shoulder, not elsewhere classified: Secondary | ICD-10-CM | POA: Diagnosis not present

## 2021-07-21 DIAGNOSIS — R279 Unspecified lack of coordination: Secondary | ICD-10-CM

## 2021-07-21 NOTE — Progress Notes (Signed)
    Chronic Care Management Pharmacy Assistant   Name: Tom Underwood  MRN: [id number] DOB: 10/24/1951  Patient called to be reminded of his appointment with [name], CPP on 07/22/2021 @ 1030 via telephone.  Patient aware of appointment date, time, and type of appointment (either telephone or in person). Patient aware to have/bring all medications, supplements, blood pressure and/or blood sugar logs to visit.  Questions: Are there any concerns you would like to discuss during your office visit? No  Are you having any problems obtaining your medications? None  Star Rating Drug: Rosuvastatin 20 mg last filled on 10/27/2020 for a 90-Day supply with [facility] Valsartan 80 mg last filled on 10/10/2020 for a 90-Day supply with [facility]  Any gaps in medications fill history? YES  Care Gaps: COVID-19 Vaccine Booster 4 INFLUENZA VACCINE  Adelene Idler, CPA/CMA Clinical Pharmacist Assistant Phone: [phone]
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
## 2021-07-22 ENCOUNTER — Ambulatory Visit (INDEPENDENT_AMBULATORY_CARE_PROVIDER_SITE_OTHER): Payer: Medicare Other

## 2021-07-22 ENCOUNTER — Telehealth: Payer: Self-pay

## 2021-07-22 DIAGNOSIS — E785 Hyperlipidemia, unspecified: Secondary | ICD-10-CM | POA: Diagnosis not present

## 2021-07-22 DIAGNOSIS — I480 Paroxysmal atrial fibrillation: Secondary | ICD-10-CM | POA: Diagnosis not present

## 2021-07-22 DIAGNOSIS — F411 Generalized anxiety disorder: Secondary | ICD-10-CM

## 2021-07-22 DIAGNOSIS — I10 Essential (primary) hypertension: Secondary | ICD-10-CM | POA: Diagnosis not present

## 2021-07-22 NOTE — Telephone Encounter (Signed)
Copied from CRM [id number]. Topic: Referral - Question >> Jul 22, 2021 12:46 PM Elliot Gault wrote: Patient would like PCP to refer him to [facility] fax # [phone]. Patient states pharmacist at PCP office recommend him to go see a counselor.
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
## 2021-07-26 ENCOUNTER — Other Ambulatory Visit: Payer: Self-pay | Admitting: Family Medicine

## 2021-07-26 DIAGNOSIS — F419 Anxiety disorder, unspecified: Secondary | ICD-10-CM

## 2021-07-28 ENCOUNTER — Ambulatory Visit: Payer: Medicare Other | Admitting: Physical Therapy

## 2021-07-28 ENCOUNTER — Other Ambulatory Visit: Payer: Self-pay

## 2021-07-28 ENCOUNTER — Encounter: Payer: Self-pay | Admitting: Physical Therapy

## 2021-07-28 DIAGNOSIS — R2689 Other abnormalities of gait and mobility: Secondary | ICD-10-CM

## 2021-07-28 DIAGNOSIS — M6281 Muscle weakness (generalized): Secondary | ICD-10-CM | POA: Diagnosis not present

## 2021-07-28 DIAGNOSIS — M25611 Stiffness of right shoulder, not elsewhere classified: Secondary | ICD-10-CM

## 2021-07-28 DIAGNOSIS — M7502 Adhesive capsulitis of left shoulder: Secondary | ICD-10-CM | POA: Diagnosis not present

## 2021-07-28 DIAGNOSIS — M7501 Adhesive capsulitis of right shoulder: Secondary | ICD-10-CM

## 2021-07-28 DIAGNOSIS — G8929 Other chronic pain: Secondary | ICD-10-CM

## 2021-07-28 DIAGNOSIS — M25511 Pain in right shoulder: Secondary | ICD-10-CM

## 2021-07-28 DIAGNOSIS — R279 Unspecified lack of coordination: Secondary | ICD-10-CM
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
## 2021-08-04 ENCOUNTER — Ambulatory Visit: Payer: Medicare Other | Admitting: Physical Therapy

## 2021-08-04 ENCOUNTER — Encounter: Payer: Self-pay | Admitting: Physical Therapy

## 2021-08-04 ENCOUNTER — Other Ambulatory Visit: Payer: Self-pay

## 2021-08-04 DIAGNOSIS — M6281 Muscle weakness (generalized): Secondary | ICD-10-CM | POA: Diagnosis not present

## 2021-08-04 DIAGNOSIS — R279 Unspecified lack of coordination: Secondary | ICD-10-CM | POA: Diagnosis not present

## 2021-08-04 DIAGNOSIS — M7502 Adhesive capsulitis of left shoulder: Secondary | ICD-10-CM | POA: Diagnosis not present

## 2021-08-04 DIAGNOSIS — R2689 Other abnormalities of gait and mobility: Secondary | ICD-10-CM

## 2021-08-04 DIAGNOSIS — G8929 Other chronic pain: Secondary | ICD-10-CM

## 2021-08-04 DIAGNOSIS — M25611 Stiffness of right shoulder, not elsewhere classified: Secondary | ICD-10-CM

## 2021-08-04 DIAGNOSIS — M7501 Adhesive capsulitis of right shoulder: Secondary | ICD-10-CM | POA: Diagnosis not present

## 2021-08-04 DIAGNOSIS — M25511 Pain in right shoulder: Secondary | ICD-10-CM
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
## 2021-08-11 ENCOUNTER — Encounter: Payer: Self-pay | Admitting: Physical Therapy

## 2021-08-11 ENCOUNTER — Other Ambulatory Visit: Payer: Self-pay

## 2021-08-11 ENCOUNTER — Ambulatory Visit: Payer: Medicare Other | Attending: Neurology | Admitting: Physical Therapy

## 2021-08-11 ENCOUNTER — Other Ambulatory Visit: Payer: Self-pay | Admitting: Family Medicine

## 2021-08-11 DIAGNOSIS — M25511 Pain in right shoulder: Secondary | ICD-10-CM | POA: Diagnosis not present

## 2021-08-11 DIAGNOSIS — M7501 Adhesive capsulitis of right shoulder: Secondary | ICD-10-CM | POA: Diagnosis not present

## 2021-08-11 DIAGNOSIS — M7502 Adhesive capsulitis of left shoulder: Secondary | ICD-10-CM | POA: Insufficient documentation

## 2021-08-11 DIAGNOSIS — R2689 Other abnormalities of gait and mobility: Secondary | ICD-10-CM | POA: Diagnosis not present

## 2021-08-11 DIAGNOSIS — M6281 Muscle weakness (generalized): Secondary | ICD-10-CM | POA: Insufficient documentation

## 2021-08-11 DIAGNOSIS — R279 Unspecified lack of coordination: Secondary | ICD-10-CM | POA: Diagnosis not present

## 2021-08-11 DIAGNOSIS — G8929 Other chronic pain: Secondary | ICD-10-CM | POA: Diagnosis not present

## 2021-08-11 DIAGNOSIS — M25611 Stiffness of right shoulder, not elsewhere classified: Secondary | ICD-10-CM | POA: Diagnosis not present

## 2021-08-11 NOTE — Patient Instructions (Signed)
Access Code: [email or web address] URL: [email or web address] Date: 08/11/2021 Prepared by: Dorene Grebe *sets and reps preformed with verbal and contact cueing to ensure proper mechanics and tissue loading.  Exercises  Seated Shoulder Scaption AAROM with Pulley at Side - 1 x daily - 7 x weekly - 20 reps Seated Shoulder Abduction AAROM with Pulley Behind - 1 x daily - 7 x weekly - 20 reps Seated Shoulder Abduction and External Rotation with Dummbells - 1 x daily - 7 x weekly - 20 reps Seated Bilateral Shoulder External Rotation with Resistance - 1 x daily - 7 x weekly - 20 reps Standing Elbow Extension with Self-Anchored Resistance - 1 x daily - 7 x weekly - 20 reps Standing Shoulder Horizontal Abduction with Resistance - 1 x daily - 7 x weekly - 20 reps Shoulder Alphabet with Dumbbell - 1 x daily - 7 x weekly - 1 reps
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
## 2021-08-18 ENCOUNTER — Ambulatory Visit: Payer: Medicare Other | Admitting: Physical Therapy

## 2021-08-18 ENCOUNTER — Other Ambulatory Visit: Payer: Self-pay

## 2021-08-18 ENCOUNTER — Encounter: Payer: Self-pay | Admitting: Physical Therapy

## 2021-08-18 DIAGNOSIS — M7501 Adhesive capsulitis of right shoulder: Secondary | ICD-10-CM | POA: Diagnosis not present

## 2021-08-18 DIAGNOSIS — M7502 Adhesive capsulitis of left shoulder: Secondary | ICD-10-CM | POA: Diagnosis not present

## 2021-08-18 DIAGNOSIS — G8929 Other chronic pain: Secondary | ICD-10-CM

## 2021-08-18 DIAGNOSIS — M25611 Stiffness of right shoulder, not elsewhere classified: Secondary | ICD-10-CM

## 2021-08-18 DIAGNOSIS — R2689 Other abnormalities of gait and mobility: Secondary | ICD-10-CM

## 2021-08-18 DIAGNOSIS — R279 Unspecified lack of coordination: Secondary | ICD-10-CM | POA: Diagnosis not present

## 2021-08-18 DIAGNOSIS — M25511 Pain in right shoulder: Secondary | ICD-10-CM | POA: Diagnosis not present

## 2021-08-18 DIAGNOSIS — M6281 Muscle weakness (generalized): Secondary | ICD-10-CM
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
## 2021-08-25 ENCOUNTER — Encounter: Payer: Medicare Other | Admitting: Physical Therapy

## 2021-08-31 ENCOUNTER — Ambulatory Visit: Payer: Self-pay

## 2021-08-31 ENCOUNTER — Emergency Department
Admission: EM | Admit: 2021-08-31 | Discharge: 2021-09-01 | Disposition: A | Discharge status: Home / Self Care | Payer: Medicare Other | Attending: Emergency Medicine | Admitting: Emergency Medicine

## 2021-08-31 ENCOUNTER — Other Ambulatory Visit: Payer: Self-pay

## 2021-08-31 ENCOUNTER — Telehealth: Payer: Self-pay | Admitting: Urology

## 2021-08-31 ENCOUNTER — Encounter: Payer: Self-pay | Admitting: Emergency Medicine

## 2021-08-31 DIAGNOSIS — I10 Essential (primary) hypertension: Secondary | ICD-10-CM | POA: Insufficient documentation

## 2021-08-31 DIAGNOSIS — Z87891 Personal history of nicotine dependence: Secondary | ICD-10-CM | POA: Diagnosis not present

## 2021-08-31 DIAGNOSIS — Z7901 Long term (current) use of anticoagulants: Secondary | ICD-10-CM | POA: Diagnosis not present

## 2021-08-31 DIAGNOSIS — K643 Fourth degree hemorrhoids: Secondary | ICD-10-CM | POA: Diagnosis not present

## 2021-08-31 DIAGNOSIS — Z79899 Other long term (current) drug therapy: Secondary | ICD-10-CM | POA: Insufficient documentation

## 2021-08-31 DIAGNOSIS — I4891 Unspecified atrial fibrillation: Secondary | ICD-10-CM | POA: Insufficient documentation

## 2021-08-31 DIAGNOSIS — K649 Unspecified hemorrhoids: Secondary | ICD-10-CM | POA: Diagnosis not present

## 2021-08-31 LAB — CBC
HCT: 48.1 % (ref 39.0–52.0)
Hemoglobin: 17.2 g/dL — ABNORMAL HIGH (ref 13.0–17.0)
MCH: 31.8 pg (ref 26.0–34.0)
MCHC: 35.8 g/dL (ref 30.0–36.0)
MCV: 88.9 fL (ref 80.0–100.0)
Platelets: 253 10*3/uL (ref 150–400)
RBC: 5.41 MIL/uL (ref 4.22–5.81)
RDW: 12.8 % (ref 11.5–15.5)
WBC: 8 10*3/uL (ref 4.0–10.5)
nRBC: 0 % (ref 0.0–0.2)

## 2021-08-31 LAB — COMPREHENSIVE METABOLIC PANEL
ALT: 26 U/L (ref 0–44)
AST: 21 U/L (ref 15–41)
Albumin: 4.2 g/dL (ref 3.5–5.0)
Alkaline Phosphatase: 109 U/L (ref 38–126)
Anion gap: 11 (ref 5–15)
BUN: 15 mg/dL (ref 8–23)
CO2: 25 mmol/L (ref 22–32)
Calcium: 9.7 mg/dL (ref 8.9–10.3)
Chloride: 102 mmol/L (ref 98–111)
Creatinine, Ser: 0.91 mg/dL (ref 0.61–1.24)
GFR, Estimated: >60 mL/min (ref >60)
Glucose, Bld: 104 mg/dL — ABNORMAL HIGH (ref 70–99)
Potassium: 3.9 mmol/L (ref 3.5–5.1)
Sodium: 138 mmol/L (ref 135–145)
Total Bilirubin: 1.7 mg/dL — ABNORMAL HIGH (ref 0.3–1.2)
Total Protein: 8.2 g/dL — ABNORMAL HIGH (ref 6.5–8.1)

## 2021-08-31 LAB — PROTIME-INR
INR: 1.6 — ABNORMAL HIGH (ref 0.8–1.2)
Prothrombin Time: 19 seconds — ABNORMAL HIGH (ref 11.4–15.2)

## 2021-08-31 LAB — TYPE AND SCREEN
ABO/RH(D): O POS
Antibody Screen: NEGATIVE

## 2021-08-31 MED ORDER — DIBUCAINE (PERIANAL) 1 % EX OINT: 1.0000 "application " | TOPICAL_OINTMENT | CUTANEOUS | 0 refills | Status: AC | PRN

## 2021-08-31 MED ORDER — HYDROCORTISONE ACETATE 25 MG RE SUPP: 25.0000 mg | Freq: Two times a day (BID) | RECTAL | 1 refills | Status: DC

## 2021-08-31 NOTE — Discharge Instructions (Signed)
Please take 10 mg of Xarelto for the next two days.

## 2021-08-31 NOTE — Telephone Encounter (Signed)
Pt LMOM that he needed Shannon's assistance and to call him back ASAP.

## 2021-08-31 NOTE — Telephone Encounter (Signed)
Patient just wanted to know if we work with hemorrhoids. I advised him PCP
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
## 2021-08-31 NOTE — ED Triage Notes (Signed)
Pt reports that he woke up and went to the bathroom and noticed that the toilet water was red. He then had a BM and had more bright red blood in the BM and the water. He states that he has hemorrhoids

## 2021-08-31 NOTE — Telephone Encounter (Signed)
Pt c/o rectal bleeding. Color is bright red and pt stated the toilet water was red. Pt stated blood was dripping out of rectum. Bleeding started today. No diarrhea and stools are formed. Pt stated he was not straining. Denies dizziness or fever.  Pt takes Xarelto d/t h/o CVA.  Denies abdominal pain. Care advice given and pt verbalized understanding. Advised pt to go to ED ASAP. Wife will drive him.        Reason for Disposition  Taking Coumadin (warfarin) or other strong blood thinner, or known bleeding disorder (e.g., thrombocytopenia)  Answer Assessment - Initial Assessment Questions 1. APPEARANCE of BLOOD: "What color is it?" "Is it passed separately, on the surface of the stool, or mixed in with the stool?"      Bright red- red water 2. AMOUNT: "How much blood was passed?"      Tablespoon or more- dripping blood  3. FREQUENCY: "How many times has blood been passed with the stools?"      1 4. ONSET: "When was the blood first seen in the stools?" (Days or weeks)      today 5. DIARRHEA: "Is there also some diarrhea?" If Yes, ask: "How many diarrhea stools in the past 24 hours?"      no 6. CONSTIPATION: "Do you have constipation?" If Yes, ask: "How bad is it?"     No- formed stools 7. RECURRENT SYMPTOMS: "Have you had blood in your stools before?" If Yes, ask: "When was the last time?" and "What happened that time?"      no 8. BLOOD THINNERS: "Do you take any blood thinners?" (e.g., Coumadin/warfarin, Pradaxa/dabigatran, aspirin)     xarelto 9. OTHER SYMPTOMS: "Do you have any other symptoms?"  (e.g., abdomen pain, vomiting, dizziness, fever)     no 10. PREGNANCY: "Is there any chance you are pregnant?" "When was your last menstrual period?"       N/a  Protocols used: Rectal Bleeding-A-AH

## 2021-09-01 ENCOUNTER — Encounter: Payer: Medicare Other | Admitting: Physical Therapy

## 2021-09-06 ENCOUNTER — Encounter: Payer: Medicare Other | Admitting: Physical Therapy

## 2021-09-06 DIAGNOSIS — K645 Perianal venous thrombosis: Secondary | ICD-10-CM | POA: Diagnosis not present

## 2021-09-14 ENCOUNTER — Telehealth: Payer: Self-pay | Admitting: Cardiovascular Disease

## 2021-09-14 ENCOUNTER — Other Ambulatory Visit: Payer: Self-pay | Admitting: Family

## 2021-09-14 NOTE — Telephone Encounter (Signed)
Prescription refill request for Xarelto received.  Indication: afib  Last office visit: 04/23/2021, [place] Weight:124.7 kg  Age: 70 yo  Scr: 0.91, 08/31/2021 CrCl: 158ml/min   Refill sent.

## 2021-09-14 NOTE — Telephone Encounter (Signed)
Patient states he received a letter tfrom [employer] patient assistance program that states his Xarelto will not be covered. Please call to discuss an alternative

## 2021-09-16 ENCOUNTER — Other Ambulatory Visit: Payer: Self-pay

## 2021-09-16 ENCOUNTER — Ambulatory Visit: Payer: Medicare Other | Attending: Neurology

## 2021-09-16 DIAGNOSIS — M25511 Pain in right shoulder: Secondary | ICD-10-CM | POA: Diagnosis not present

## 2021-09-16 DIAGNOSIS — M7501 Adhesive capsulitis of right shoulder: Secondary | ICD-10-CM | POA: Diagnosis not present

## 2021-09-16 DIAGNOSIS — M7502 Adhesive capsulitis of left shoulder: Secondary | ICD-10-CM | POA: Insufficient documentation

## 2021-09-16 DIAGNOSIS — M6281 Muscle weakness (generalized): Secondary | ICD-10-CM | POA: Insufficient documentation

## 2021-09-16 DIAGNOSIS — R2689 Other abnormalities of gait and mobility: Secondary | ICD-10-CM | POA: Insufficient documentation

## 2021-09-16 DIAGNOSIS — M25611 Stiffness of right shoulder, not elsewhere classified: Secondary | ICD-10-CM | POA: Diagnosis not present

## 2021-09-16 DIAGNOSIS — G8929 Other chronic pain: Secondary | ICD-10-CM | POA: Diagnosis not present

## 2021-09-16 DIAGNOSIS — R279 Unspecified lack of coordination: Secondary | ICD-10-CM | POA: Insufficient documentation
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
## 2021-09-17 NOTE — Telephone Encounter (Signed)
Called and spoke with both the patient and his wife and gave them the phone number for the Xarelto $85 program as well as the phone number for the Eliquis assistance. Patient is meeting with his insurance rep next week to possible change his prescription coverage. He stated he does have enough Xarelto on hand to get him through the end of the year. He will let me know if he needs any further assistance.

## 2021-09-17 NOTE — Telephone Encounter (Signed)
Patient is calling back to discuss his patient assistance for his Xarelto.

## 2021-09-21 ENCOUNTER — Telehealth: Payer: Self-pay

## 2021-09-21 NOTE — Progress Notes (Signed)
Chronic Care Management Pharmacy Assistant   Name: Tom Underwood  MRN: [id number] DOB: 02/25/1951  Reason for Encounter: Hypertension Disease State Call   Recent office visits:  None ID  Recent consult visits:  08/18/2021  Ronnie Derby, PT (Physical Therapy) for Decreased range of motion- No medication changes noted; no orders placed  08/18/2021  Dorene Grebe, PT (Physical Therapy) for Decreased range of motion- No medication changes noted; no orders placed  08/11/2021  Dorene Grebe, PT (Physical Therapy) for Decreased range of motion- No medication changes noted; no orders placed  08/04/2021  Dorene Grebe, PT (Physical Therapy) for Decreased range of motion- No medication changes noted; no orders placed  07/28/2021 Dorene Grebe, PT (Physical Therapy) for Decreased range of motion- No medication changes noted; no orders placed  Hospital visits:  Medication Reconciliation was completed by comparing discharge summary, patient's EMR and Pharmacy list, and upon discussion with patient.  Admitted to the hospital on 08/31/2021 due to Grade IV Hemorrhoids. Discharge date was 09/01/2021. Discharged from [facility] Emergency Department.    New?Medications Started at [facility] Discharge:?? -Started dibucaine 1 % Oint Place 1 application rectally as needed for up to 7 days for hemorrhoids. hydrocortisone 25 MG suppository Place 1 suppository (25 mg total) rectally every 12 (twelve) hours.  Medication Changes at Hospital Discharge: -Changed None ID  Medications Discontinued at Hospital Discharge: -Stopped None ID  Medications that remain the same after Hospital Discharge:??  -All other medications will remain the same.    Medications: Outpatient Encounter Medications as of 09/21/2021  Medication Sig   acetaminophen (TYLENOL) 650 MG CR tablet Take 650 mg by mouth every 8 (eight) hours as needed for pain.   amitriptyline (ELAVIL) 25 MG tablet TAKE 1  TABLET BY MOUTH AT BEDTIME AS NEEDED FOR SLEEP   buPROPion (WELLBUTRIN XL) 150 MG 24 hr tablet Take 1 tablet (150 mg total) by mouth in the morning.   busPIRone (BUSPAR) 10 MG tablet Take 1 tablet (10 mg total) by mouth 2 (two) times daily.   Cetirizine HCl 10 MG CAPS Take 10 mg by mouth in the morning.    Cholecalciferol (VITAMIN D) 125 MCG (5000 UT) CAPS Take 5,000 Units by mouth daily.   furosemide (LASIX) 20 MG tablet Take 1 tablet (20 mg total) by mouth as needed. For lower extremity swelling.   glucosamine-chondroitin 500-400 MG tablet Take 2 tablets by mouth daily.    hydrocortisone (ANUSOL-HC) 25 MG suppository Place 1 suppository (25 mg total) rectally every 12 (twelve) hours.   metoprolol tartrate (LOPRESSOR) 50 MG tablet Take 1 tablet (50 mg total) by mouth 2 (two) times daily.   Multiple Vitamin (MULTIVITAMIN) tablet Take 1 tablet by mouth daily.   omega-3 acid ethyl esters (LOVAZA) 1 g capsule Take 2 capsules (2 g total) by mouth 2 (two) times daily.   pantoprazole (PROTONIX) 20 MG tablet Take 1 tablet (20 mg total) by mouth daily.   rivaroxaban (XARELTO) 20 MG TABS tablet TAKE 1 TABLET BY MOUTH ONCE DAILY WITH  SUPPER   rosuvastatin (CRESTOR) 20 MG tablet Take 1 tablet (20 mg total) by mouth daily.   Saccharomyces boulardii (PROBIOTIC) 250 MG CAPS Take 1 capsule by mouth daily.   valsartan (DIOVAN) 80 MG tablet Take 1 tablet (80 mg total) by mouth daily.   VISINE DRY EYE RELIEF 1 % SOLN Place 1 drop into both eyes as needed.   No facility-administered encounter medications on file as of 09/21/2021.  Care Gaps: Zoster Vaccine COVID-19 Booster 4 Influenza Vaccine   Star Rating Drugs: Rosuvastatin 20 mg last filled on 08/11/2021 for a 90-Day supply with [facility] Valsartan last filled on 08/11/2021 for a 90-Day supply with [facility]  Reviewed chart prior to disease state call. Spoke with patient regarding BP  Recent Office Vitals: BP Readings from Last 3  Encounters:  08/31/21 134/85  06/28/21 131/82  06/01/21 132/84   Pulse Readings from Last 3 Encounters:  08/31/21 94  06/28/21 89  06/01/21 100    Wt Readings from Last 3 Encounters:  08/31/21 275 lb (124.7 kg)  06/28/21 282 lb (127.9 kg)  06/01/21 279 lb (126.6 kg)     Kidney Function Lab Results  Component Value Date/Time   CREATININE 0.91 08/31/2021 02:34 PM   CREATININE 1.03 05/28/2021 01:36 PM   GFRNONAA >60 08/31/2021 02:34 PM   GFRAA >60 07/17/2020 05:24 AM    BMP Latest Ref Rng & Units 08/31/2021 05/28/2021 07/17/2020  Glucose 70 - 99 mg/dL 660(Y) 301(S) 010(X)  BUN 8 - 23 mg/dL 15 17 17   Creatinine 0.61 - 1.24 mg/dL 3.23 5.57  BUN/Creat Ratio 10 - 24 - 17 -  Sodium 135 - 145 mmol/L 138 142 138  Potassium 3.5 - 5.1 mmol/L 3.9 3.7 3.6  Chloride 98 - 111 mmol/L 102 103 105  CO2 22 - 32 mmol/L 25 20 23   Calcium 8.9 - 10.3 mg/dL 9.7 9.9 9.0    Current antihypertensive regimen:  Furosemide 20 mg daily as needed  Metoprolol tartrate 50 mg twice daily  Valsartan 80 mg daily  How often are you checking your Blood Pressure? weekly  Current home BP readings: Patient was not home so could not provide me with a recent numer  What recent interventions/DTPs have been made by any provider to improve Blood Pressure control since last CPP Visit: None ID  Any recent hospitalizations or ED visits since last visit with CPP? Yes  What diet changes have been made to improve Blood Pressure Control?  Patient states he hasn't had any changes to his diet he still tries to limit his salt, and fried foods.  What exercise is being done to improve your Blood Pressure Control?  Patient right now is doing Physical Therapy, and reports that he has lost some weight    Adherence Review: Is the patient currently on ACE/ARB medication? Yes Does the patient have >5 day gap between last estimated fill dates? No  Patient states he is doing well. Patient does report he was in the  emergency room a few weeks ago as he had issues with his Hemorrhoids. Patient reports since his visit he is doing much better. He reports that he is going to Physical Therapy, and he is doing well with that. Patient stated his therapist is requesting that he follows up with Dr. 3.22 since he was in the ER for Hemorrhoids and I advised I would have someone reach out to him to get him scheduled with that follow-up. Patient reports that he did get a referral to see a behavioral health therapist, but he is having some issues with receiving the paperwork via his e-mail that they need him to fill out prior to his appointment. I informed him that I would give them a call to see if I can assist with getting that paperwork for him. Patient has no other concerns or issues at this time.  Contacted  [facility],  but I had to leave a voice message requesting a call back. I also stated in my voice message that I was needing new patient paperwork for this patient, and requested that they e-mail me with the paperwork if possible.  I also reached out to Dr. Carlynn Purl office, and spoke with a front desk representative about giving the patient a call to assist him with scheduling a ER follow-up visit. Representative informed me that she would call patient, and get this taken care of.    Patient has scheduled telephone visit with [name], CPP on 01/26/2022 @1100   , CPA/CMA Clinical Pharmacist Assistant Phone: [phone]

## 2021-09-21 NOTE — Telephone Encounter (Signed)
Patient updated insurance coverage to include medication coverage.  With new plan he was told xarelto would now be $105  .      Please call to discuss the plan that is cheaper .

## 2021-09-22 ENCOUNTER — Encounter: Payer: Self-pay | Admitting: Physical Therapy

## 2021-09-22 ENCOUNTER — Encounter: Payer: Self-pay | Admitting: Family Medicine

## 2021-09-22 ENCOUNTER — Other Ambulatory Visit: Payer: Self-pay

## 2021-09-22 ENCOUNTER — Ambulatory Visit: Payer: Medicare Other | Admitting: Physical Therapy

## 2021-09-22 DIAGNOSIS — G8929 Other chronic pain: Secondary | ICD-10-CM | POA: Diagnosis not present

## 2021-09-22 DIAGNOSIS — M25611 Stiffness of right shoulder, not elsewhere classified: Secondary | ICD-10-CM | POA: Diagnosis not present

## 2021-09-22 DIAGNOSIS — R279 Unspecified lack of coordination: Secondary | ICD-10-CM

## 2021-09-22 DIAGNOSIS — R2689 Other abnormalities of gait and mobility: Secondary | ICD-10-CM

## 2021-09-22 DIAGNOSIS — M6281 Muscle weakness (generalized): Secondary | ICD-10-CM | POA: Diagnosis not present

## 2021-09-22 DIAGNOSIS — M25511 Pain in right shoulder: Secondary | ICD-10-CM | POA: Diagnosis not present

## 2021-09-22 DIAGNOSIS — M7501 Adhesive capsulitis of right shoulder: Secondary | ICD-10-CM
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
## 2021-09-29 ENCOUNTER — Other Ambulatory Visit: Payer: Self-pay

## 2021-09-29 ENCOUNTER — Ambulatory Visit: Payer: Medicare Other | Admitting: Physical Therapy

## 2021-09-29 VITALS — BP 116/53

## 2021-09-29 DIAGNOSIS — M6281 Muscle weakness (generalized): Secondary | ICD-10-CM | POA: Diagnosis not present

## 2021-09-29 DIAGNOSIS — R2689 Other abnormalities of gait and mobility: Secondary | ICD-10-CM | POA: Diagnosis not present

## 2021-09-29 DIAGNOSIS — M25511 Pain in right shoulder: Secondary | ICD-10-CM | POA: Diagnosis not present

## 2021-09-29 DIAGNOSIS — R279 Unspecified lack of coordination: Secondary | ICD-10-CM

## 2021-09-29 DIAGNOSIS — M25611 Stiffness of right shoulder, not elsewhere classified: Secondary | ICD-10-CM

## 2021-09-29 DIAGNOSIS — M7502 Adhesive capsulitis of left shoulder: Secondary | ICD-10-CM

## 2021-09-29 DIAGNOSIS — G8929 Other chronic pain: Secondary | ICD-10-CM | POA: Diagnosis not present
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
## 2021-10-07 ENCOUNTER — Ambulatory Visit: Payer: Medicare Other | Admitting: Physical Therapy

## 2021-10-18 ENCOUNTER — Other Ambulatory Visit: Payer: Self-pay

## 2021-10-18 ENCOUNTER — Encounter: Payer: Self-pay | Admitting: Family Medicine

## 2021-10-18 ENCOUNTER — Ambulatory Visit (INDEPENDENT_AMBULATORY_CARE_PROVIDER_SITE_OTHER): Payer: Medicare Other | Admitting: Family Medicine

## 2021-10-18 VITALS — BP 130/70 | HR 96 | Temp 97.5°F | Resp 16 | Ht 70.0 in | Wt 288.9 lb

## 2021-10-18 DIAGNOSIS — K649 Unspecified hemorrhoids: Secondary | ICD-10-CM | POA: Insufficient documentation

## 2021-10-18 DIAGNOSIS — R2681 Unsteadiness on feet: Secondary | ICD-10-CM | POA: Diagnosis not present

## 2021-10-18 DIAGNOSIS — M172 Bilateral post-traumatic osteoarthritis of knee: Secondary | ICD-10-CM

## 2021-10-18 DIAGNOSIS — Z23 Encounter for immunization: Secondary | ICD-10-CM

## 2021-10-18 DIAGNOSIS — K643 Fourth degree hemorrhoids: Secondary | ICD-10-CM | POA: Diagnosis not present

## 2021-10-18 NOTE — Patient Instructions (Signed)
It was great to see you!  Our plans for today:  - We have placed your physical therapy referral.  - Let us know if your hemorrhoids get worse.   Take care and seek immediate care sooner if you develop any concerns.   Dr. Linwood Dibbles

## 2021-10-18 NOTE — Progress Notes (Signed)
   SUBJECTIVE:   CHIEF COMPLAINT / HPI:   ER FOLLOW UP Hospital/facility: [facility] 09/27 Diagnosis: hemorrhoid Procedures/tests:  - H&H reassuring Consultants: surgery outpt New medications:  - dibucaine, anusol suppository Discharge instructions:   Status: better - no blood in stool - no pain or itching currently - not interested in seeing surgery  Needs new PT referral - going to PT in [place] for gait instability after stroke few years ago - also with R shoulder pain - sees Dorene Grebe in [place] - has improvement in pain and mobility - currently going once per week  OBJECTIVE:   BP 130/70   Pulse 96   Temp (!) 97.5 F (36.4 C)   Resp 16   Ht 5\' 10"  (1.778 m)   Wt 288 lb 14.4 oz (131 kg)   SpO2 98%   BMI 41.45 kg/m   Gen: well appearing, in NAD Card: Reg rate Lungs: Comfortable WOB on RA  Ext: WWP  ASSESSMENT/PLAN:   Hemorrhoid Improved with treatment from ED, currently declining surgical referral, continue to monitor.   Gait instability PT referral generated   , DO

## 2021-10-18 NOTE — Assessment & Plan Note (Signed)
Improved with treatment from ED, currently declining surgical referral, continue to monitor.

## 2021-10-27 ENCOUNTER — Ambulatory Visit: Payer: Medicare Other | Attending: Neurology

## 2021-10-27 ENCOUNTER — Other Ambulatory Visit: Payer: Self-pay

## 2021-10-27 DIAGNOSIS — R269 Unspecified abnormalities of gait and mobility: Secondary | ICD-10-CM | POA: Insufficient documentation

## 2021-10-27 DIAGNOSIS — M6281 Muscle weakness (generalized): Secondary | ICD-10-CM | POA: Insufficient documentation

## 2021-10-27 DIAGNOSIS — M172 Bilateral post-traumatic osteoarthritis of knee: Secondary | ICD-10-CM | POA: Diagnosis not present

## 2021-10-27 DIAGNOSIS — R2681 Unsteadiness on feet: Secondary | ICD-10-CM | POA: Insufficient documentation

## 2021-10-27 DIAGNOSIS — R262 Difficulty in walking, not elsewhere classified: Secondary | ICD-10-CM | POA: Diagnosis not present
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
## 2021-11-02 ENCOUNTER — Encounter: Payer: Self-pay | Admitting: Family Medicine

## 2021-11-02 ENCOUNTER — Telehealth: Payer: Self-pay | Admitting: Cardiovascular Disease

## 2021-11-02 ENCOUNTER — Ambulatory Visit (INDEPENDENT_AMBULATORY_CARE_PROVIDER_SITE_OTHER): Payer: Medicare Other | Admitting: Family Medicine

## 2021-11-02 ENCOUNTER — Other Ambulatory Visit: Payer: Self-pay | Admitting: Family Medicine

## 2021-11-02 ENCOUNTER — Inpatient Hospital Stay: Payer: Medicare Other | Admitting: Family Medicine

## 2021-11-02 VITALS — BP 136/84 | HR 91 | Temp 97.5°F | Resp 16 | Ht 70.0 in | Wt 288.0 lb

## 2021-11-02 DIAGNOSIS — E559 Vitamin D deficiency, unspecified: Secondary | ICD-10-CM

## 2021-11-02 DIAGNOSIS — I480 Paroxysmal atrial fibrillation: Secondary | ICD-10-CM | POA: Diagnosis not present

## 2021-11-02 DIAGNOSIS — K219 Gastro-esophageal reflux disease without esophagitis: Secondary | ICD-10-CM

## 2021-11-02 DIAGNOSIS — R7303 Prediabetes: Secondary | ICD-10-CM

## 2021-11-02 DIAGNOSIS — R2681 Unsteadiness on feet: Secondary | ICD-10-CM

## 2021-11-02 DIAGNOSIS — I69351 Hemiplegia and hemiparesis following cerebral infarction affecting right dominant side: Secondary | ICD-10-CM

## 2021-11-02 DIAGNOSIS — G4733 Obstructive sleep apnea (adult) (pediatric): Secondary | ICD-10-CM | POA: Diagnosis not present

## 2021-11-02 DIAGNOSIS — M25511 Pain in right shoulder: Secondary | ICD-10-CM | POA: Diagnosis not present

## 2021-11-02 DIAGNOSIS — M25512 Pain in left shoulder: Secondary | ICD-10-CM | POA: Diagnosis not present

## 2021-11-02 DIAGNOSIS — N400 Enlarged prostate without lower urinary tract symptoms: Secondary | ICD-10-CM

## 2021-11-02 DIAGNOSIS — I10 Essential (primary) hypertension: Secondary | ICD-10-CM | POA: Diagnosis not present

## 2021-11-02 DIAGNOSIS — F341 Dysthymic disorder: Secondary | ICD-10-CM

## 2021-11-02 MED ORDER — TAMSULOSIN HCL 0.4 MG PO CAPS: 0.4000 mg | ORAL_CAPSULE | Freq: Every day | ORAL | 0 refills | Status: DC

## 2021-11-02 NOTE — Telephone Encounter (Signed)
Patient would like the phone number for the [facility] program for Xarelto that was discussed at his last appointment.

## 2021-11-03 NOTE — Telephone Encounter (Signed)
Harlow Mares RN sent patient a MyChart with the requested information. Closing encounter.

## 2021-11-04 ENCOUNTER — Ambulatory Visit: Payer: Medicare Other | Attending: Neurology | Admitting: Physical Therapy

## 2021-11-04 ENCOUNTER — Other Ambulatory Visit: Payer: Self-pay

## 2021-11-04 ENCOUNTER — Encounter: Payer: Self-pay | Admitting: Physical Therapy

## 2021-11-04 DIAGNOSIS — R269 Unspecified abnormalities of gait and mobility: Secondary | ICD-10-CM | POA: Diagnosis not present

## 2021-11-04 DIAGNOSIS — M25512 Pain in left shoulder: Secondary | ICD-10-CM | POA: Diagnosis not present

## 2021-11-04 DIAGNOSIS — R2681 Unsteadiness on feet: Secondary | ICD-10-CM | POA: Insufficient documentation

## 2021-11-04 DIAGNOSIS — R279 Unspecified lack of coordination: Secondary | ICD-10-CM | POA: Diagnosis not present

## 2021-11-04 DIAGNOSIS — M7502 Adhesive capsulitis of left shoulder: Secondary | ICD-10-CM | POA: Insufficient documentation

## 2021-11-04 DIAGNOSIS — M25511 Pain in right shoulder: Secondary | ICD-10-CM | POA: Insufficient documentation

## 2021-11-04 DIAGNOSIS — M25611 Stiffness of right shoulder, not elsewhere classified: Secondary | ICD-10-CM | POA: Diagnosis not present

## 2021-11-04 DIAGNOSIS — M7501 Adhesive capsulitis of right shoulder: Secondary | ICD-10-CM | POA: Diagnosis not present

## 2021-11-04 DIAGNOSIS — G8929 Other chronic pain: Secondary | ICD-10-CM | POA: Diagnosis not present

## 2021-11-04 DIAGNOSIS — M6281 Muscle weakness (generalized): Secondary | ICD-10-CM | POA: Diagnosis not present

## 2021-11-04 DIAGNOSIS — R262 Difficulty in walking, not elsewhere classified: Secondary | ICD-10-CM | POA: Insufficient documentation

## 2021-11-04 DIAGNOSIS — R2689 Other abnormalities of gait and mobility: Secondary | ICD-10-CM | POA: Insufficient documentation

## 2021-11-04 NOTE — Therapy (Signed)
[facility] [address] [place], [place], [address] Phone: [phone]   Fax:  [phone]  Physical Therapy Treatment  Patient Details  Name: Tom Underwood MRN: [id number] Date of Birth: 1951-07-21 Referring Provider (PT): Steele Sizer, MD   Encounter Date: 11/04/2021   PT End of Session - 11/04/21 1302     Visit Number 2    Number of Visits 17    Date for PT Re-Evaluation 12/22/21    Authorization Type Medicare A&B; generic commercial (visit limits based on medical necessity)    Authorization Time Period 04/13/21-07/06/21    Authorization - Visit Number 2    Authorization - Number of Visits 10    PT Start Time 1258    PT Stop Time 1354    PT Time Calculation (min) 56 min    Equipment Utilized During Treatment Gait belt    Activity Tolerance Patient tolerated treatment well    Behavior During Therapy WFL for tasks assessed/performed             Past Medical History:  Diagnosis Date   Allergy    Decreased libido    Diverticulitis    GERD (gastroesophageal reflux disease)    HLD (hyperlipidemia)    Hydronephrosis with renal and ureteral calculus obstruction    Hydronephrosis with renal and ureteral calculus obstruction    Hypertension    Hypogonadism in male    Rhinitis, allergic    Sleep apnea    Stroke [facility])    Ureteral stone     Past Surgical History:  Procedure Laterality Date   EXTERNAL EAR SURGERY     FINGER SURGERY     KNEE SURGERY     left eye      There were no vitals filed for this visit.   Subjective Assessment - 11/04/21 1301     Subjective Pt. entered PT with new MD order for B shoulders.  Pt. reports 2/10 B knee pain prior to PT tx. session.    Pertinent History Tom Underwood is well known to PT clinic. Presenting to eval today for further LE strengthening, gait, and balance deficits. Reporting since stroke, MD told him he lost ~60% of his LE strength. Current baseline is household and  short community stances with SPC. Does endorse difficulty in safe ability to get in/out of shower, donning/doffing socks indep, long distance ambulation, endorses wishing to improve his balance where he can use two hands for ADL's and not feel like he needs to rely on SUE on an object for safety. Pt reports ability to safely perform STS but wishes to improve LE endurance. States he is safe with his 5 stairs to enter/exit home with use of handrail and SPC. Denies any falls in the last 6 months. These are all goals pt wishes to accomplish and improve upon.    Limitations Walking;Standing;Lifting;House hold activities    How long can you sit comfortably? unlimited    How long can you stand comfortably? 15-20 minutes    How long can you walk comfortably? 15 minutes    Patient Stated Goals Improve balance, LE strength/endurance.    Currently in Pain? Yes    Pain Score 2     Pain Location Knee    Pain Orientation Right;Left               Ther.ex.:  Nustep L4 10 min. B UE/LE.  1 minute of cool down  (completed 0.65 mile).   Discussed HEP/  shoulder pulleys  Nautilus (in standing): 50# lat. Pull downs/ 40# tricep extension/ 50# scap. Retraction 20x each.  Walking in hallway (2 laps)- increase SOB.   High marching/ alt. UE and LE touches/ lateral walking 3 laps.  Cuing for technique with alt. Touches.    Assessed vitals t/o tx.     PT Short Term Goals - 10/27/21 1420       PT SHORT TERM GOAL #1   Title Pt will be indep with HEP to improve strength and balance to reduce risk of falls.    Baseline 11/23: initiated    Time 4    Period Weeks    Status New    Target Date 11/24/21               PT Long Term Goals - 10/27/21 1421       PT LONG TERM GOAL #1   Title Pt will improve FOTO score to target to display improvement in functional mobility.    Baseline 11/23: 29/54    Time 8    Period Weeks    Status New    Target Date 12/22/21      PT LONG TERM GOAL #2   Title  Pt wil improve TUG score to < 12 sec with no AD to display significant imporvement in reduced risk of falls    Baseline 11/23: 26 sec (did not sit down immediately leading to higher time than he would've scored)    Time 8    Period Weeks    Status New    Target Date 12/22/21      PT LONG TERM GOAL #3   Title Pt will improve DGI to >19/24 to indicate decreased risk of falls with household and community walking tasks    Baseline 11/23: 11/24    Time 8    Period Weeks    Status New    Target Date 12/22/21      PT LONG TERM GOAL #4   Title Pt will improve 30 sec STS >12 reps to display clinically significnat improvement in LE strength/endurance for functional mobility.    Baseline 11/23: 7 reps    Time 8    Period Weeks    Status New    Target Date 12/22/21                   Plan - 11/05/21 1346     Clinical Impression Statement Tx. focused on standing therex./ endurance tasks to improve funcitonal mobility.  Several seated rest breaks required between tasks.  Pt. able to complete all Nautilus ex. in standing position today with only 1 seated rest breaks.  Pts. HR and O2 sat. remained WNL during all aspects of tx.  Pt. encouraged to complete daily walks several times/ day and remain compliant with HEP.  Pt. will continue to benefit from skilled PT services 1x/week to improve B UE/LE strength, balance and overall functional mobility.    Personal Factors and Comorbidities Comorbidity 2    Comorbidities Hypertension, Obesity    Examination-Activity Limitations Bed Mobility;Reach Overhead;Squat;Lift;Dressing;Hygiene/Grooming    Examination-Participation Restrictions Community Activity;Yard Work    Conservation officer, historic buildings Evolving/Moderate complexity    Clinical Decision Making Moderate    Rehab Potential Fair    PT Frequency 2x / week    PT Duration 8 weeks    PT Treatment/Interventions ADLs/Self Care Home Management;Gait training;Stair training;Functional mobility  training;Therapeutic activities;Therapeutic exercise;Balance training;Neuromuscular re-education;Manual techniques;Patient/family education;Electrical Stimulation;Moist Heat;Cryotherapy    PT Next Visit  Plan Initiate HEP. Encouraged pt on STS with focus on endurance with high reps. 6MWT or 2MWT.    Consulted and Agree with Plan of Care Patient             Patient will benefit from skilled therapeutic intervention in order to improve the following deficits and impairments:  Abnormal gait, Decreased activity tolerance, Decreased balance, Decreased coordination, Decreased mobility, Decreased endurance, Decreased range of motion, Decreased strength, Difficulty walking, Dizziness, Impaired UE functional use, Pain, Hypomobility  Visit Diagnosis: Abnormality of gait and mobility  Difficulty in walking, not elsewhere classified  Muscle weakness (generalized)  Unsteadiness on feet  Decreased range of motion of right shoulder  Other abnormalities of gait and mobility  Chronic right shoulder pain  Unspecified lack of coordination  Adhesive capsulitis of both shoulders     Problem List Patient Active Problem List   Diagnosis Date Noted   Hemorrhoid 10/18/2021   Obesity, Class III, BMI 40-49.9 (morbid obesity) (HCC)    Atrial fibrillation with RVR ([place]) 07/14/2020   Hypertension    Hemiparesis affecting right side as late effect of cerebrovascular accident ([place]) 08/13/2019   History of kidney stones 05/02/2018   ED (erectile dysfunction) 05/02/2018   Hyperglycemia 01/30/2017   Arthritis of knee, degenerative 01/30/2017   BPH with obstruction/lower urinary tract symptoms 02/03/2016   Gross hematuria 02/03/2016   Dyslipidemia 08/04/2015   Diverticulitis of large intestine without perforation or abscess without bleeding 06/30/2015   Edema 06/30/2015   OSA on CPAP 06/30/2015   Pura Spice, PT, DPT # [id number]  11/05/2021, 3:03 PM  [facility]  [facility] [address] [place], [place], [address] Phone: [phone]   Fax:  [phone]  Name: Tom Underwood MRN: [id number] Date of Birth: June 10, 1951

## 2021-11-07 ENCOUNTER — Other Ambulatory Visit: Payer: Self-pay | Admitting: Family Medicine

## 2021-11-07 DIAGNOSIS — K219 Gastro-esophageal reflux disease without esophagitis: Secondary | ICD-10-CM

## 2021-11-08 ENCOUNTER — Other Ambulatory Visit: Payer: Self-pay | Admitting: *Deleted

## 2021-11-08 ENCOUNTER — Telehealth: Payer: Self-pay | Admitting: Cardiology

## 2021-11-08 MED ORDER — METOPROLOL TARTRATE 50 MG PO TABS: 50.0000 mg | ORAL_TABLET | Freq: Two times a day (BID) | ORAL | 0 refills | Status: DC

## 2021-11-08 NOTE — Telephone Encounter (Signed)
Patient would like a contact number for the assistance program that helped him receive his Xarelto last year for a discounted cost.

## 2021-11-08 NOTE — Telephone Encounter (Signed)
Spoke with patient to provide him with the number for [employer]. He states Xarelto will be on the formulary of his new insurance plan effective in 2023 however it will still cost him $101/month until he reaches $1800 out of pocket.  Patient also requesting a 30 day supply to be sent to [facility] on [address]. Routing to the Anticoag pool for refill.

## 2021-11-09 MED ORDER — RIVAROXABAN 20 MG PO TABS: 20.0000 mg | ORAL_TABLET | Freq: Every day | ORAL | 0 refills | Status: DC

## 2021-11-09 NOTE — Telephone Encounter (Signed)
LVM to schedule appt

## 2021-11-09 NOTE — Telephone Encounter (Signed)
Prescription refill request for Xarelto received.  Indication: PAF Last office visit: 04/05/21  Agbor-Etang MD Weight: 127.9kg Age: 70 Scr: 0.91 on 08/31/21 CrCl: 140.55  Based on above findings Xarelto 20mg  daily is the appropriate dose. 1 month supple sent local to [facility] per pt request.  Pt is past due for appt.  Message sent to schedulers to make appt.

## 2021-11-09 NOTE — Telephone Encounter (Signed)
Requested Prescriptions   Signed Prescriptions Disp Refills   rivaroxaban (XARELTO) 20 MG TABS tablet 30 tablet 0    Sig: Take 1 tablet (20 mg total) by mouth daily with supper.    Authorizing Provider: Debbe Odea    Ordering User: Kendrick Fries

## 2021-11-11 ENCOUNTER — Ambulatory Visit: Payer: Medicare Other | Admitting: Physical Therapy

## 2021-11-11 ENCOUNTER — Encounter: Payer: Self-pay | Admitting: Physical Therapy

## 2021-11-11 ENCOUNTER — Other Ambulatory Visit: Payer: Self-pay

## 2021-11-11 DIAGNOSIS — R2681 Unsteadiness on feet: Secondary | ICD-10-CM

## 2021-11-11 DIAGNOSIS — R269 Unspecified abnormalities of gait and mobility: Secondary | ICD-10-CM | POA: Diagnosis not present

## 2021-11-11 DIAGNOSIS — R2689 Other abnormalities of gait and mobility: Secondary | ICD-10-CM | POA: Diagnosis not present

## 2021-11-11 DIAGNOSIS — M6281 Muscle weakness (generalized): Secondary | ICD-10-CM | POA: Diagnosis not present

## 2021-11-11 DIAGNOSIS — M25611 Stiffness of right shoulder, not elsewhere classified: Secondary | ICD-10-CM

## 2021-11-11 DIAGNOSIS — R262 Difficulty in walking, not elsewhere classified: Secondary | ICD-10-CM

## 2021-11-11 NOTE — Therapy (Signed)
[facility] [address] [place], [place], [address] Phone: [phone]   Fax:  [phone]  Physical Therapy Treatment  Patient Details  Name: Tom Underwood MRN: [id number] Date of Birth: 1951-06-08 Referring Provider (PT): Alba Cory, MD   Encounter Date: 11/11/2021    PT End of Session - 11/11/21 1350     Visit Number 3    Number of Visits 17    Date for PT Re-Evaluation 12/22/21    Authorization Type Medicare A&B; generic commercial (visit limits based on medical necessity)    Authorization Time Period 04/13/21-07/06/21    Authorization - Visit Number 3    Authorization - Number of Visits 10    PT Start Time 1340 to 1438   Equipment Utilized During Treatment Gait belt    Activity Tolerance Patient tolerated treatment well    Behavior During Therapy [facility] for tasks assessed/performed               Past Medical History:  Diagnosis Date   Allergy    Decreased libido    Diverticulitis    GERD (gastroesophageal reflux disease)    HLD (hyperlipidemia)    Hydronephrosis with renal and ureteral calculus obstruction    Hydronephrosis with renal and ureteral calculus obstruction    Hypertension    Hypogonadism in male    Rhinitis, allergic    Sleep apnea    Stroke [facility])    Ureteral stone     Past Surgical History:  Procedure Laterality Date   EXTERNAL EAR SURGERY     FINGER SURGERY     KNEE SURGERY     left eye      There were no vitals filed for this visit.   Subjective Assessment - 11/16/21 1334     Subjective Pt. c/o stiffness/ generalized discomfort in B knees/ lower leg.  Pt. c/o discomfort in B shoulder, esp. with overhead/ functional reaching.    Pertinent History Tom Underwood is well known to PT clinic. Presenting to eval today for further LE strengthening, gait, and balance deficits. Reporting since stroke, MD told him he lost ~60% of his LE strength. Current baseline is household and short  community stances with SPC. Does endorse difficulty in safe ability to get in/out of shower, donning/doffing socks indep, long distance ambulation, endorses wishing to improve his balance where he can use two hands for ADL's and not feel like he needs to rely on SUE on an object for safety. Pt reports ability to safely perform STS but wishes to improve LE endurance. States he is safe with his 5 stairs to enter/exit home with use of handrail and SPC. Denies any falls in the last 6 months. These are all goals pt wishes to accomplish and improve upon.    Limitations Walking;Standing;Lifting;House hold activities    How long can you sit comfortably? unlimited    How long can you stand comfortably? 15-20 minutes    How long can you walk comfortably? 15 minutes    Patient Stated Goals Improve balance, LE strength/endurance.    Currently in Pain? Yes    Pain Score 2     Pain Location Knee    Pain Orientation Right;Left    Pain Descriptors / Indicators Aching                Ther.ex.:   Nustep L5 10 min. B UE/LE.  (completed 0.6 mile).  BP: 141/70.  HR: 98 bpm.     Seated  B shoulder wand ex. (Flexion/ chest press/ abduction/ standing sh. Extension/ IR)- 20x each with mirror feedback.   High marching/ alt. UE and LE touches/ lateral walking 3 laps.  Cuing for technique with alt. Touches.  Slight increase in L back/hip region.     Nautilus (in standing): 50# lat. Pull downs/ 50# scap. Retraction 15x2 each.  Standing shoulder flexion at wall with yellow ball 10x.     Walking in hallway (2 laps)/ outside on varying terrain.  Marked fatigue.           PT Short Term Goals - 10/27/21 1420       PT SHORT TERM GOAL #1   Title Pt will be indep with HEP to improve strength and balance to reduce risk of falls.    Baseline 11/23: initiated    Time 4    Period Weeks    Status New    Target Date 11/24/21               PT Long Term Goals - 10/27/21 1421       PT LONG TERM GOAL #1    Title Pt will improve FOTO score to target to display improvement in functional mobility.    Baseline 11/23: 29/54    Time 8    Period Weeks    Status New    Target Date 12/22/21      PT LONG TERM GOAL #2   Title Pt wil improve TUG score to < 12 sec with no AD to display significant imporvement in reduced risk of falls    Baseline 11/23: 26 sec (did not sit down immediately leading to higher time than he would've scored)    Time 8    Period Weeks    Status New    Target Date 12/22/21      PT LONG TERM GOAL #3   Title Pt will improve DGI to >19/24 to indicate decreased risk of falls with household and community walking tasks    Baseline 11/23: 11/24    Time 8    Period Weeks    Status New    Target Date 12/22/21      PT LONG TERM GOAL #4   Title Pt will improve 30 sec STS >12 reps to display clinically significnat improvement in LE strength/endurance for functional mobility.    Baseline 11/23: 7 reps    Time 8    Period Weeks    Status New    Target Date 12/22/21              Generalized B LE fatigue noted as tx. progresses. Pt. works hard with all aspects of strength training/ dynamic activities during tx.. No LOB during standing therex./ endurance tasks to improve funcitonal mobility. Several seated rest breaks required between tasks. Pt. able to complete all Nautilus ex. in standing position today with only 1 seated rest breaks. Moderate cuing to correct upright posture/ step length while walking around PT gym/ between ex. Pt. encouraged to complete daily walks several times/ day and remain compliant with HEP. Pt. will continue to benefit from skilled PT services 1x/week to improve B UE/LE strength, balance and overall functional mobility.       Patient will benefit from skilled therapeutic intervention in order to improve the following deficits and impairments:  Abnormal gait, Decreased activity tolerance, Decreased balance, Decreased coordination, Decreased  mobility, Decreased endurance, Decreased range of motion, Decreased strength, Difficulty walking, Dizziness, Impaired UE functional use, Pain, Hypomobility  Visit Diagnosis: Abnormality of gait and mobility  Difficulty in walking, not elsewhere classified  Muscle weakness (generalized)  Unsteadiness on feet  Decreased range of motion of right shoulder  Other abnormalities of gait and mobility     Problem List Patient Active Problem List   Diagnosis Date Noted   Hemorrhoid 10/18/2021   Obesity, Class III, BMI 40-49.9 (morbid obesity) ([place])    Atrial fibrillation with RVR ([place]) 07/14/2020   Hypertension    Hemiparesis affecting right side as late effect of cerebrovascular accident ([place]) 08/13/2019   History of kidney stones 05/02/2018   ED (erectile dysfunction) 05/02/2018   Hyperglycemia 01/30/2017   Arthritis of knee, degenerative 01/30/2017   BPH with obstruction/lower urinary tract symptoms 02/03/2016   Gross hematuria 02/03/2016   Dyslipidemia 08/04/2015   Diverticulitis of large intestine without perforation or abscess without bleeding 06/30/2015   Edema 06/30/2015   OSA on CPAP 06/30/2015   Pura Spice, PT, DPT # [phone] 11/17/2021, 12:05 PM  [facility] [address] [place], [place], [address] Phone: [phone]   Fax:  [phone]  Name: Tom Underwood MRN: [id number] Date of Birth: 04/25/1951

## 2021-11-12 ENCOUNTER — Ambulatory Visit: Payer: PRIVATE HEALTH INSURANCE | Admitting: Family Medicine

## 2021-11-15 ENCOUNTER — Encounter: Payer: Self-pay | Admitting: Cardiology

## 2021-11-15 ENCOUNTER — Other Ambulatory Visit: Payer: Self-pay

## 2021-11-15 ENCOUNTER — Ambulatory Visit (INDEPENDENT_AMBULATORY_CARE_PROVIDER_SITE_OTHER): Payer: Medicare Other | Admitting: Cardiology

## 2021-11-15 ENCOUNTER — Telehealth: Payer: Self-pay | Admitting: Emergency Medicine

## 2021-11-15 VITALS — BP 120/70 | HR 89 | Ht 70.5 in | Wt 288.0 lb

## 2021-11-15 DIAGNOSIS — E7800 Pure hypercholesterolemia, unspecified: Secondary | ICD-10-CM

## 2021-11-15 DIAGNOSIS — I10 Essential (primary) hypertension: Secondary | ICD-10-CM | POA: Diagnosis not present

## 2021-11-15 DIAGNOSIS — I4819 Other persistent atrial fibrillation: Secondary | ICD-10-CM | POA: Diagnosis not present

## 2021-11-15 NOTE — Progress Notes (Signed)
Cardiology Office Note:    Date:  11/15/2021   ID:  Tom Underwood, DOB [birth year], MRN [id number]  PCP:  Steele Sizer, MD  [facility] Cardiologist:  Kate Sable, MD  [place] Electrophysiologist:  None   Referring MD: Steele Sizer, MD   Chief Complaint  Patient presents with   Other    6 month follow up -- Meds reviewed verbally with patient.     History of Present Illness:    Tom Underwood is a 70 y.o. 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    Patient is being seen for paroxysmal atrial fibrillation.  Denies palpitations, dizziness.  Endorses using CPAP mask as prescribed.  Denies dizziness.  Lasix was started after last visit due to edema, this is helping.  No bleeding effects with taking Xarelto.  States having significant progress regarding right leg weakness with working with physical therapy.  Prior notes Echocardiogram [id number] normal systolic function, EF 55 to 60%.  Normal diastolic function.  Past Medical History:  Diagnosis Date   Allergy    Decreased libido    Diverticulitis    GERD (gastroesophageal reflux disease)    HLD (hyperlipidemia)    Hydronephrosis with renal and ureteral calculus obstruction    Hydronephrosis with renal and ureteral calculus obstruction    Hypertension    Hypogonadism in male    Rhinitis, allergic    Sleep apnea    Stroke [facility])    Ureteral stone     Past Surgical History:  Procedure Laterality Date   EXTERNAL EAR SURGERY     FINGER SURGERY     KNEE SURGERY     left eye      Current Medications: Current Meds  Medication Sig   acetaminophen (TYLENOL) 650 MG CR tablet Take 650 mg by mouth every 8 (eight) hours as needed for pain.   amitriptyline (ELAVIL) 25 MG tablet TAKE 1 TABLET BY MOUTH AT BEDTIME AS NEEDED FOR SLEEP   buPROPion (WELLBUTRIN XL) 150 MG 24 hr tablet TAKE 1 TABLET  BY MOUTH IN THE MORNING   busPIRone (BUSPAR) 10 MG tablet Take 1 tablet (10 mg total) by mouth 2 (two) times daily.   Cetirizine HCl 10 MG CAPS Take 10 mg by mouth in the morning.    Cholecalciferol (VITAMIN D) 125 MCG (5000 UT) CAPS Take 5,000 Units by mouth daily.   furosemide (LASIX) 20 MG tablet Take 1 tablet (20 mg total) by mouth as needed. For lower extremity swelling.   glucosamine-chondroitin 500-400 MG tablet Take 2 tablets by mouth daily.    hydrocortisone (ANUSOL-HC) 25 MG suppository Place 1 suppository (25 mg total) rectally every 12 (twelve) hours.   metoprolol tartrate (LOPRESSOR) 50 MG tablet Take 1 tablet (50 mg total) by mouth 2 (two) times daily. PLEASE CALL OFFICE TO SCHEDULE APPOINTMENT FOR FURTHER REFILLS   Multiple Vitamin (MULTIVITAMIN) tablet Take 1 tablet by mouth daily.   omega-3 acid ethyl esters (LOVAZA) 1 g capsule Take 2 capsules (2 g total) by mouth 2 (two) times daily.   pantoprazole (PROTONIX) 20 MG tablet Take 1 tablet by mouth once daily   rivaroxaban (XARELTO) 20 MG TABS tablet Take 1 tablet (20 mg total) by mouth daily with supper.   rosuvastatin (CRESTOR) 20 MG tablet Take 1 tablet (20 mg total) by mouth daily.   Saccharomyces boulardii (PROBIOTIC) 250 MG CAPS Take 1 capsule by mouth daily.  tamsulosin (FLOMAX) 0.4 MG CAPS capsule Take 1 capsule (0.4 mg total) by mouth daily.   valsartan (DIOVAN) 80 MG tablet Take 1 tablet (80 mg total) by mouth daily.   VISINE DRY EYE RELIEF 1 % SOLN Place 1 drop into both eyes as needed.     Allergies:   Penicillins   Social History   Socioeconomic History   Marital status: Married    Spouse name: [name]    Number of children: 2   Years of education: Not on file   Highest education level: Not on file  Occupational History   Occupation: retired   Tobacco Use   Smoking status: Former    Types: Pipe    Start date: 02/20/1973    Quit date: 02/20/1974    Years since quitting: 47.7   Smokeless tobacco: Never   Vaping Use   Vaping Use: Never used  Substance and Sexual Activity   Alcohol use: No    Alcohol/week: 0.0 standard drinks   Drug use: No   Sexual activity: Yes    Partners: Female  Other Topics Concern   Not on file  Social History Narrative   Not on file   Social Determinants of Health   Financial Resource Strain: Low Risk    Difficulty of Paying Living Expenses: Not hard at all  Food Insecurity: No Food Insecurity   Worried About Programme researcher, broadcasting/film/video in the Last Year: Never true   Ran Out of Food in the Last Year: Never true  Transportation Needs: No Transportation Needs   Lack of Transportation (Medical): No   Lack of Transportation (Non-Medical): No  Physical Activity: Insufficiently Active   Days of Exercise per Week: 4 days   Minutes of Exercise per Session: 10 min  Stress: Stress Concern Present   Feeling of Stress : To some extent  Social Connections: Press photographer of Communication with Friends and Family: More than three times a week   Frequency of Social Gatherings with Friends and Family: Twice a week   Attends Religious Services: More than 4 times per year   Active Member of [employer] or Organizations: Yes   Attends Engineer, structural: More than 4 times per year   Marital Status: Married     Family History: The patient's family history includes Asthma in his father and mother; Dementia in his mother; Heart attack in his mother; Heart disease in his father and mother; Stroke in his father.  ROS:   Please see the history of present illness.     All other systems reviewed and are negative.  EKGs/Labs/Other Studies Reviewed:    The following studies were reviewed today:   EKG:  EKG was ordered today.  EKG shows atrial fibrillation, heart rate 89  Recent Labs: 05/28/2021: TSH 2.530 08/31/2021: ALT 26; BUN 15; Creatinine, Ser 0.91; Hemoglobin 17.2; Platelets 253; Potassium 3.9; Sodium 138  Recent Lipid Panel    Component Value  Date/Time   CHOL 120 05/28/2021 1336   TRIG 79 05/28/2021 1336   HDL 51 05/28/2021 1336   CHOLHDL 2.4 05/28/2021 1336   LDLCALC 53 05/28/2021 1336     Risk Assessment/Calculations:      Physical Exam:    VS:  BP 120/70 (BP Location: Right Arm, Patient Position: Sitting, Cuff Size: Large)   Pulse 89   Ht 5' 10.5" (1.791 m)   Wt 288 lb (130.6 kg)   SpO2 95%   BMI 40.74 kg/m  Wt Readings from Last 3 Encounters:  11/15/21 288 lb (130.6 kg)  11/02/21 288 lb (130.6 kg)  10/18/21 288 lb 14.4 oz (131 kg)     GEN:  Well nourished, well developed in no acute distress HEENT: Normal NECK: No JVD; No carotid bruits LYMPHATICS: No lymphadenopathy CARDIAC: Irregular irregular, no murmurs, rubs, gallops RESPIRATORY:  Clear to auscultation without rales, wheezing or rhonchi  ABDOMEN: Soft, non-tender, non-distended MUSCULOSKELETAL: Trace edema; No deformity  SKIN: Warm and dry NEUROLOGIC:  Alert and oriented x 3 PSYCHIATRIC:  Normal affect   ASSESSMENT:    1. Persistent atrial fibrillation ([place])   2. Essential hypertension   3. Pure hypercholesterolemia     PLAN:    In order of problems listed above:  Atrial fibrillation, persistent, currently in atrial fibrillation.CHA2DS2-VASc score 4 (Age, htn, CVA). continue Xarelto 20 mg daily, Lopressor 50 mg twice daily.  Echo 07/2020 showed normal systolic function, EF 55 to 60%.  Schedule DC cardioversion.  Has not missed a dose of Xarelto over the past year.  Schedule appointment with A. fib clinic.  Continue Lasix 20 mg daily for edema. History of hypertension, BP controlled.  Continue Lopressor, valsartan. History of hyperlipidemia, cholesterol controlled, continue statin, lovaza.  Follow-up in 3 months.  Shared Decision Making/Informed Consent The risks (stroke, cardiac arrhythmias rarely resulting in the need for a temporary or permanent pacemaker, skin irritation or burns and complications associated with conscious sedation  including aspiration, arrhythmia, respiratory failure and death), benefits (restoration of normal sinus rhythm) and alternatives of a direct current cardioversion were explained in detail to Mr. Herzberg and he agrees to proceed.     Medication Adjustments/Labs and Tests Ordered: Current medicines are reviewed at length with the patient today.  Concerns regarding medicines are outlined above.  Orders Placed This Encounter  Procedures   CBC   Basic Metabolic Panel (BMET)   Ambulatory referral to Cardiac Electrophysiology   EKG 12-Lead    No orders of the defined types were placed in this encounter.   Patient Instructions  Medication Instructions:  Your physician recommends that you continue on your current medications as directed. Please refer to the Current Medication list given to you today.  *If you need a refill on your cardiac medications before your next appointment, please call your pharmacy*   Lab Work: CBC, BMP drawn today in office   If you have labs (blood work) drawn today and your tests are completely normal, you will receive your results only by: [place] (if you have MyChart) OR A paper copy in the mail If you have any lab test that is abnormal or we need to change your treatment, we will call you to review the results.   Testing/Procedures:  You are scheduled for a Cardioversion on January 4th, 2023 with Dr.Agbor-Etang Please arrive at the [place] [facility] at 6:30 a.m. on the day of your procedure.  DIET INSTRUCTIONS:  Nothing to eat or drink after midnight except your medications with a sip of water.         Labs: drawn in office 12/12  Medications:  YOU MAY TAKE ALL of your remaining medications with a small amount of water.  Must have a responsible person to drive you home.  Bring a current list of your medications and current insurance cards.    If you have any questions after you get home, please call the office at [phone]     Follow-Up: At [facility], you and your  health needs are our priority.  As part of our continuing mission to provide you with exceptional heart care, we have created designated Provider Care Teams.  These Care Teams include your primary Cardiologist (physician) and Advanced Practice Providers (APPs -  Physician Assistants and Nurse Practitioners) who all work together to provide you with the care you need, when you need it.  We recommend signing up for the patient portal called "MyChart".  Sign up information is provided on this After Visit Summary.  MyChart is used to connect with patients for Virtual Visits (Telemedicine).  Patients are able to view lab/test results, encounter notes, upcoming appointments, etc.  Non-urgent messages can be sent to your provider as well.   To learn more about what you can do with MyChart, go to [email or web address].    Your next appointment:   3 month(s)  The format for your next appointment:   In Person  Provider:   You may see Kate Sable, MD or one of the following Advanced Practice Providers on your designated Care Team:   Murray Hodgkins, NP Christell Faith, PA-C Cadence Kathlen Mody, PA-C1}    Other Instructions N/A   Signed, Kate Sable, MD  11/15/2021 11:03 AM    [place]

## 2021-11-15 NOTE — Telephone Encounter (Signed)
Patient called and made aware that Dr. Okey Dupre will be doing his cardioversion on 1/4, not Dr. Azucena Cecil. Pt verbalized understanding, no questions or concerns.

## 2021-11-15 NOTE — Patient Instructions (Addendum)
Medication Instructions:  Your physician recommends that you continue on your current medications as directed. Please refer to the Current Medication list given to you today.  *If you need a refill on your cardiac medications before your next appointment, please call your pharmacy*   Lab Work: CBC, BMP drawn today in office   If you have labs (blood work) drawn today and your tests are completely normal, you will receive your results only by: MyChart Message (if you have MyChart) OR A paper copy in the mail If you have any lab test that is abnormal or we need to change your treatment, we will call you to review the results.   Testing/Procedures:  You are scheduled for a Cardioversion on January 4th, 2023 with Dr.Agbor-Etang Please arrive at the [facility] at 6:30 a.m. on the day of your procedure.  DIET INSTRUCTIONS:  Nothing to eat or drink after midnight except your medications with a sip of water.         Labs: drawn in office 12/12  Medications:  YOU MAY TAKE ALL of your remaining medications with a small amount of water.  Must have a responsible person to drive you home.  Bring a current list of your medications and current insurance cards.    If you have any questions after you get home, please call the office at [phone]    Follow-Up: At [facility], you and your health needs are our priority.  As part of our continuing mission to provide you with exceptional heart care, we have created designated Provider Care Teams.  These Care Teams include your primary Cardiologist (physician) and Advanced Practice Providers (APPs -  Physician Assistants and Nurse Practitioners) who all work together to provide you with the care you need, when you need it.  We recommend signing up for the patient portal called "MyChart".  Sign up information is provided on this After Visit Summary.  MyChart is used to connect with patients for Virtual Visits (Telemedicine).  Patients  are able to view lab/test results, encounter notes, upcoming appointments, etc.  Non-urgent messages can be sent to your provider as well.   To learn more about what you can do with MyChart, go to [email or web address].    Your next appointment:   3 month(s)  The format for your next appointment:   In Person  Provider:   You may see Debbe Odea, MD or one of the following Advanced Practice Providers on your designated Care Team:   Nicolasa Ducking, NP Eula Listen, PA-C Cadence Fransico [name], PA-C1}    Other Instructions N/A

## 2021-11-15 NOTE — H&P (View-Only) (Signed)
Cardiology Office Note:    Date:  11/15/2021   ID:  ULYSSE LODICO, DOB [birth year], MRN [id number]  PCP:  Steele Sizer, MD  [facility] Cardiologist:  Kate Sable, MD  [place] Electrophysiologist:  None   Referring MD: Steele Sizer, MD   Chief Complaint  Patient presents with   Other    6 month follow up -- Meds reviewed verbally with patient.     History of Present Illness:    CHI ILLESCAS is a 70 y.o. 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    Patient is being seen for paroxysmal atrial fibrillation.  Denies palpitations, dizziness.  Endorses using CPAP mask as prescribed.  Denies dizziness.  Lasix was started after last visit due to edema, this is helping.  No bleeding effects with taking Xarelto.  States having significant progress regarding right leg weakness with working with physical therapy.  Prior notes Echocardiogram [id number] normal systolic function, EF 55 to 60%.  Normal diastolic function.  Past Medical History:  Diagnosis Date   Allergy    Decreased libido    Diverticulitis    GERD (gastroesophageal reflux disease)    HLD (hyperlipidemia)    Hydronephrosis with renal and ureteral calculus obstruction    Hydronephrosis with renal and ureteral calculus obstruction    Hypertension    Hypogonadism in male    Rhinitis, allergic    Sleep apnea    Stroke [facility])    Ureteral stone     Past Surgical History:  Procedure Laterality Date   EXTERNAL EAR SURGERY     FINGER SURGERY     KNEE SURGERY     left eye      Current Medications: Current Meds  Medication Sig   acetaminophen (TYLENOL) 650 MG CR tablet Take 650 mg by mouth every 8 (eight) hours as needed for pain.   amitriptyline (ELAVIL) 25 MG tablet TAKE 1 TABLET BY MOUTH AT BEDTIME AS NEEDED FOR SLEEP   buPROPion (WELLBUTRIN XL) 150 MG 24 hr tablet TAKE 1 TABLET  BY MOUTH IN THE MORNING   busPIRone (BUSPAR) 10 MG tablet Take 1 tablet (10 mg total) by mouth 2 (two) times daily.   Cetirizine HCl 10 MG CAPS Take 10 mg by mouth in the morning.    Cholecalciferol (VITAMIN D) 125 MCG (5000 UT) CAPS Take 5,000 Units by mouth daily.   furosemide (LASIX) 20 MG tablet Take 1 tablet (20 mg total) by mouth as needed. For lower extremity swelling.   glucosamine-chondroitin 500-400 MG tablet Take 2 tablets by mouth daily.    hydrocortisone (ANUSOL-HC) 25 MG suppository Place 1 suppository (25 mg total) rectally every 12 (twelve) hours.   metoprolol tartrate (LOPRESSOR) 50 MG tablet Take 1 tablet (50 mg total) by mouth 2 (two) times daily. PLEASE CALL OFFICE TO SCHEDULE APPOINTMENT FOR FURTHER REFILLS   Multiple Vitamin (MULTIVITAMIN) tablet Take 1 tablet by mouth daily.   omega-3 acid ethyl esters (LOVAZA) 1 g capsule Take 2 capsules (2 g total) by mouth 2 (two) times daily.   pantoprazole (PROTONIX) 20 MG tablet Take 1 tablet by mouth once daily   rivaroxaban (XARELTO) 20 MG TABS tablet Take 1 tablet (20 mg total) by mouth daily with supper.   rosuvastatin (CRESTOR) 20 MG tablet Take 1 tablet (20 mg total) by mouth daily.   Saccharomyces boulardii (PROBIOTIC) 250 MG CAPS Take 1 capsule by mouth daily.  tamsulosin (FLOMAX) 0.4 MG CAPS capsule Take 1 capsule (0.4 mg total) by mouth daily.   valsartan (DIOVAN) 80 MG tablet Take 1 tablet (80 mg total) by mouth daily.   VISINE DRY EYE RELIEF 1 % SOLN Place 1 drop into both eyes as needed.     Allergies:   Penicillins   Social History   Socioeconomic History   Marital status: Married    Spouse name: Butch Penny    Number of children: 2   Years of education: Not on file   Highest education level: Not on file  Occupational History   Occupation: retired   Tobacco Use   Smoking status: Former    Types: Pipe    Start date: 02/20/1973    Quit date: 02/20/1974    Years since quitting: 47.7   Smokeless tobacco: Never   Vaping Use   Vaping Use: Never used  Substance and Sexual Activity   Alcohol use: No    Alcohol/week: 0.0 standard drinks   Drug use: No   Sexual activity: Yes    Partners: Female  Other Topics Concern   Not on file  Social History Narrative   Not on file   Social Determinants of Health   Financial Resource Strain: Low Risk    Difficulty of Paying Living Expenses: Not hard at all  Food Insecurity: No Food Insecurity   Worried About Charity fundraiser in the Last Year: Never true   [place] in the Last Year: Never true  Transportation Needs: No Transportation Needs   Lack of Transportation (Medical): No   Lack of Transportation (Non-Medical): No  Physical Activity: Insufficiently Active   Days of Exercise per Week: 4 days   Minutes of Exercise per Session: 10 min  Stress: Stress Concern Present   Feeling of Stress : To some extent  Social Connections: Engineer, building services of Communication with Friends and Family: More than three times a week   Frequency of Social Gatherings with Friends and Family: Twice a week   Attends Religious Services: More than 4 times per year   Active Member of [employer] or Organizations: Yes   Attends Music therapist: More than 4 times per year   Marital Status: Married     Family History: The patient's family history includes Asthma in his father and mother; Dementia in his mother; Heart attack in his mother; Heart disease in his father and mother; Stroke in his father.  ROS:   Please see the history of present illness.     All other systems reviewed and are negative.  EKGs/Labs/Other Studies Reviewed:    The following studies were reviewed today:   EKG:  EKG was ordered today.  EKG shows atrial fibrillation, heart rate 89  Recent Labs: 05/28/2021: TSH 2.530 08/31/2021: ALT 26; BUN 15; Creatinine, Ser 0.91; Hemoglobin 17.2; Platelets 253; Potassium 3.9; Sodium 138  Recent Lipid Panel    Component Value  Date/Time   CHOL 120 05/28/2021 1336   TRIG 79 05/28/2021 1336   HDL 51 05/28/2021 1336   CHOLHDL 2.4 05/28/2021 1336   LDLCALC 53 05/28/2021 1336     Risk Assessment/Calculations:      Physical Exam:    VS:  BP 120/70 (BP Location: Right Arm, Patient Position: Sitting, Cuff Size: Large)    Pulse 89    Ht 5' 10.5" (1.791 m)    Wt 288 lb (130.6 kg)    SpO2 95%    BMI 40.74  kg/m     Wt Readings from Last 3 Encounters:  11/15/21 288 lb (130.6 kg)  11/02/21 288 lb (130.6 kg)  10/18/21 288 lb 14.4 oz (131 kg)     GEN:  Well nourished, well developed in no acute distress HEENT: Normal NECK: No JVD; No carotid bruits LYMPHATICS: No lymphadenopathy CARDIAC: Irregular irregular, no murmurs, rubs, gallops RESPIRATORY:  Clear to auscultation without rales, wheezing or rhonchi  ABDOMEN: Soft, non-tender, non-distended MUSCULOSKELETAL: Trace edema; No deformity  SKIN: Warm and dry NEUROLOGIC:  Alert and oriented x 3 PSYCHIATRIC:  Normal affect   ASSESSMENT:    1. Persistent atrial fibrillation ([place])   2. Essential hypertension   3. Pure hypercholesterolemia     PLAN:    In order of problems listed above:  Atrial fibrillation, persistent, currently in atrial fibrillation.CHA2DS2-VASc score 4 (Age, htn, CVA). continue Xarelto 20 mg daily, Lopressor 50 mg twice daily.  Echo 07/2020 showed normal systolic function, EF 55 to 60%.  Schedule DC cardioversion.  Has not missed a dose of Xarelto over the past year.  Schedule appointment with A. fib clinic.  Continue Lasix 20 mg daily for edema. History of hypertension, BP controlled.  Continue Lopressor, valsartan. History of hyperlipidemia, cholesterol controlled, continue statin, lovaza.  Follow-up in 3 months.  Shared Decision Making/Informed Consent The risks (stroke, cardiac arrhythmias rarely resulting in the need for a temporary or permanent pacemaker, skin irritation or burns and complications associated with conscious sedation  including aspiration, arrhythmia, respiratory failure and death), benefits (restoration of normal sinus rhythm) and alternatives of a direct current cardioversion were explained in detail to Mr. Rolin and he agrees to proceed.     Medication Adjustments/Labs and Tests Ordered: Current medicines are reviewed at length with the patient today.  Concerns regarding medicines are outlined above.  Orders Placed This Encounter  Procedures   CBC   Basic Metabolic Panel (BMET)   Ambulatory referral to Cardiac Electrophysiology   EKG 12-Lead    No orders of the defined types were placed in this encounter.   Patient Instructions  Medication Instructions:  Your physician recommends that you continue on your current medications as directed. Please refer to the Current Medication list given to you today.  *If you need a refill on your cardiac medications before your next appointment, please call your pharmacy*   Lab Work: CBC, BMP drawn today in office   If you have labs (blood work) drawn today and your tests are completely normal, you will receive your results only by: [place] (if you have MyChart) OR A paper copy in the mail If you have any lab test that is abnormal or we need to change your treatment, we will call you to review the results.   Testing/Procedures:  You are scheduled for a Cardioversion on January 4th, 2023 with Dr.Agbor-Etang Please arrive at the [facility] at 6:30 a.m. on the day of your procedure.  DIET INSTRUCTIONS:  Nothing to eat or drink after midnight except your medications with a sip of water.         Labs: drawn in office 12/12  Medications:  YOU MAY TAKE ALL of your remaining medications with a small amount of water.  Must have a responsible person to drive you home.  Bring a current list of your medications and current insurance cards.    If you have any questions after you get home, please call the office at [phone]     Follow-Up: At  [facility], you and your health needs are our priority.  As part of our continuing mission to provide you with exceptional heart care, we have created designated Provider Care Teams.  These Care Teams include your primary Cardiologist (physician) and Advanced Practice Providers (APPs -  Physician Assistants and Nurse Practitioners) who all work together to provide you with the care you need, when you need it.  We recommend signing up for the patient portal called "MyChart".  Sign up information is provided on this After Visit Summary.  MyChart is used to connect with patients for Virtual Visits (Telemedicine).  Patients are able to view lab/test results, encounter notes, upcoming appointments, etc.  Non-urgent messages can be sent to your provider as well.   To learn more about what you can do with MyChart, go to [email or web address].    Your next appointment:   3 month(s)  The format for your next appointment:   In Person  Provider:   You may see Kate Sable, MD or one of the following Advanced Practice Providers on your designated Care Team:   Murray Hodgkins, NP Christell Faith, PA-C Cadence Kathlen Mody, PA-C1}    Other Instructions N/A   Signed, Kate Sable, MD  11/15/2021 11:03 AM    [place]

## 2021-11-16 ENCOUNTER — Other Ambulatory Visit: Payer: Self-pay | Admitting: *Deleted

## 2021-11-16 LAB — BASIC METABOLIC PANEL
BUN/Creatinine Ratio: 15 (ref 10–24)
BUN: 15 mg/dL (ref 8–27)
CO2: 22 mmol/L (ref 20–29)
Calcium: 10.1 mg/dL (ref 8.6–10.2)
Chloride: 103 mmol/L (ref 96–106)
Creatinine, Ser: 1.03 mg/dL (ref 0.76–1.27)
Glucose: 112 mg/dL — ABNORMAL HIGH (ref 70–99)
Potassium: 4.2 mmol/L (ref 3.5–5.2)
Sodium: 142 mmol/L (ref 134–144)
eGFR: 79 mL/min/{1.73_m2} (ref >59)

## 2021-11-16 LAB — CBC
Hematocrit: 50.7 % (ref 37.5–51.0)
Hemoglobin: 17.8 g/dL — ABNORMAL HIGH (ref 13.0–17.7)
MCH: 30.8 pg (ref 26.6–33.0)
MCHC: 35.1 g/dL (ref 31.5–35.7)
MCV: 88 fL (ref 79–97)
Platelets: 240 10*3/uL (ref 150–450)
RBC: 5.77 x10E6/uL (ref 4.14–5.80)
RDW: 13.4 % (ref 11.6–15.4)
WBC: 6.2 10*3/uL (ref 3.4–10.8)

## 2021-11-16 MED ORDER — METOPROLOL TARTRATE 50 MG PO TABS: 50.0000 mg | ORAL_TABLET | Freq: Two times a day (BID) | ORAL | 0 refills | Status: DC

## 2021-11-17 ENCOUNTER — Other Ambulatory Visit: Payer: Self-pay

## 2021-11-17 ENCOUNTER — Telehealth: Payer: Self-pay | Admitting: Cardiology

## 2021-11-17 MED ORDER — METOPROLOL TARTRATE 50 MG PO TABS: 50.0000 mg | ORAL_TABLET | Freq: Two times a day (BID) | ORAL | 0 refills | Status: DC

## 2021-11-17 NOTE — Telephone Encounter (Signed)
Spoke with the patient. Patient wanting to know what he can expect on the day of of his dccv.  Patients questions answered to the best of my ability. Confirmed the scheduled dcvv time, date, location and provider.  Patient sts that he received a letter from [facility] that he would not qualify for patients assistance for his Xarelto in 2023 through their program due to him having prescription coverage. The letter suggested that he pursue assistance with the [place] program, the letter provided him with their telephone.  Adv the patient to go ahead and call Linwood Dibbles and provide them the info they need from him for the application. Adv the patient that they usually will reach out to the ordering MD to provide the provider portion of the application. Adv the patient that I will fwd an FYI to Dr. Merita Norton nurse Doy Hutching, RN.  Patient voiced appreciation for the assistance.

## 2021-11-17 NOTE — Telephone Encounter (Signed)
Patient states he has some questions regarding his procedure on 1/4. Please call to discuss

## 2021-11-18 ENCOUNTER — Other Ambulatory Visit: Payer: Self-pay

## 2021-11-18 ENCOUNTER — Encounter: Payer: Self-pay | Admitting: Physical Therapy

## 2021-11-18 ENCOUNTER — Ambulatory Visit: Payer: Medicare Other | Admitting: Physical Therapy

## 2021-11-18 DIAGNOSIS — M6281 Muscle weakness (generalized): Secondary | ICD-10-CM | POA: Diagnosis not present

## 2021-11-18 DIAGNOSIS — M25611 Stiffness of right shoulder, not elsewhere classified: Secondary | ICD-10-CM

## 2021-11-18 DIAGNOSIS — G8929 Other chronic pain: Secondary | ICD-10-CM

## 2021-11-18 DIAGNOSIS — R2689 Other abnormalities of gait and mobility: Secondary | ICD-10-CM | POA: Diagnosis not present

## 2021-11-18 DIAGNOSIS — R2681 Unsteadiness on feet: Secondary | ICD-10-CM

## 2021-11-18 DIAGNOSIS — R262 Difficulty in walking, not elsewhere classified: Secondary | ICD-10-CM | POA: Diagnosis not present

## 2021-11-18 DIAGNOSIS — R269 Unspecified abnormalities of gait and mobility: Secondary | ICD-10-CM | POA: Diagnosis not present

## 2021-11-18 NOTE — Telephone Encounter (Signed)
Noted  

## 2021-11-18 NOTE — Therapy (Signed)
[place] [facility] [address] [place], [place], [address] Phone: [phone]   Fax:  [phone]  Physical Therapy Treatment  Patient Details  Name: Tom Underwood MRN: [id number] Date of Birth: September 27, 1951 Referring Provider (PT): Steele Sizer, MD   Encounter Date: 11/18/2021   PT End of Session - 11/18/21 1254     Visit Number 4    Number of Visits 17    Date for PT Re-Evaluation 12/22/21    Authorization Type Medicare A&B; generic commercial (visit limits based on medical necessity)    Authorization Time Period 04/13/21-07/06/21    Authorization - Visit Number 4    Authorization - Number of Visits 10    PT Start Time 1255 to [place] During Treatment Gait belt    Activity Tolerance Patient tolerated treatment well    Behavior During Therapy [facility] for tasks assessed/performed             Past Medical History:  Diagnosis Date   Allergy    Decreased libido    Diverticulitis    GERD (gastroesophageal reflux disease)    HLD (hyperlipidemia)    Hydronephrosis with renal and ureteral calculus obstruction    Hydronephrosis with renal and ureteral calculus obstruction    Hypertension    Hypogonadism in male    Rhinitis, allergic    Sleep apnea    Stroke [facility])    Ureteral stone     Past Surgical History:  Procedure Laterality Date   EXTERNAL EAR SURGERY     FINGER SURGERY     KNEE SURGERY     left eye      There were no vitals filed for this visit.   Subjective Assessment - 11/18/21 1253     Subjective Pt. reports 4/10 pain in B knees/ lower legs/ B shoulders today.  Pt. states he has remained compliant with daily stretching, esp. with shoulders.    Pertinent History Tom Underwood is well known to PT clinic. Presenting to eval today for further LE strengthening, gait, and balance deficits. Reporting since stroke, MD told him he lost ~60% of his LE strength. Current baseline is household and short  community stances with SPC. Does endorse difficulty in safe ability to get in/out of shower, donning/doffing socks indep, long distance ambulation, endorses wishing to improve his balance where he can use two hands for ADL's and not feel like he needs to rely on SUE on an object for safety. Pt reports ability to safely perform STS but wishes to improve LE endurance. States he is safe with his 5 stairs to enter/exit home with use of handrail and SPC. Denies any falls in the last 6 months. These are all goals pt wishes to accomplish and improve upon.    Limitations Walking;Standing;Lifting;House hold activities    How long can you sit comfortably? unlimited    How long can you stand comfortably? 15-20 minutes    How long can you walk comfortably? 15 minutes    Patient Stated Goals Improve balance, LE strength/endurance.    Currently in Pain? Yes    Pain Score 4     Pain Location Knee    Pain Orientation Right;Left    Pain Descriptors / Indicators Aching    Pain Score 4    Pain Location Shoulder    Pain Orientation Right;Left               Ther.ex.:   Nustep L5 12 min. B  UE/LE.  (completed 0.66 miles).  HR: 98 bpm.     Nautilus (in standing): 50# lat. Pull downs/ 40# tricep extension/ 50# scap. Retraction 15x2 each.     Seated B shoulder wand ex. (Flexion/ chest press/ abduction/ standing sh. Extension/ IR)- 20x each with mirror feedback.    High marching/ alt. UE and LE touches/ lateral walking 3 laps.  Cuing for technique with alt. Touches.  Slight increase in L back/hip region.      Walking in hallway (2 laps) with cuing for consistent step pattern/ upright posture/ heel strike/ arm swing.          PT Short Term Goals - 10/27/21 1420       PT SHORT TERM GOAL #1   Title Pt will be indep with HEP to improve strength and balance to reduce risk of falls.    Baseline 11/23: initiated    Time 4    Period Weeks    Status New    Target Date 11/24/21               PT  Long Term Goals - 10/27/21 1421       PT LONG TERM GOAL #1   Title Pt will improve FOTO score to target to display improvement in functional mobility.    Baseline 11/23: 29/54    Time 8    Period Weeks    Status New    Target Date 12/22/21      PT LONG TERM GOAL #2   Title Pt wil improve TUG score to < 12 sec with no AD to display significant imporvement in reduced risk of falls    Baseline 11/23: 26 sec (did not sit down immediately leading to higher time than he would've scored)    Time 8    Period Weeks    Status New    Target Date 12/22/21      PT LONG TERM GOAL #3   Title Pt will improve DGI to >19/24 to indicate decreased risk of falls with household and community walking tasks    Baseline 11/23: 11/24    Time 8    Period Weeks    Status New    Target Date 12/22/21      PT LONG TERM GOAL #4   Title Pt will improve 30 sec STS >12 reps to display clinically significnat improvement in LE strength/endurance for functional mobility.    Baseline 11/23: 7 reps    Time 8    Period Weeks    Status New    Target Date 12/22/21              Tx. focused on standing therex. with good upright posture and BOS. Pt. works hard during endurance tasks to improve funcitonal mobility. Several seated rest breaks required between tasks. Pt. able to complete all Nautilus ex. in standing position today with only 1 seated rest breaks. Pt. encouraged to complete daily walks several times/ day and remain compliant with HEP. Pt. will continue to benefit from skilled PT services 1x/week to improve B UE/LE strength, balance and overall functional mobility. Pt. schedule for cardiac ablation procedure in early January.       Patient will benefit from skilled therapeutic intervention in order to improve the following deficits and impairments:  Abnormal gait, Decreased activity tolerance, Decreased balance, Decreased coordination, Decreased mobility, Decreased endurance, Decreased range of motion,  Decreased strength, Difficulty walking, Dizziness, Impaired UE functional use, Pain, Hypomobility  Visit Diagnosis: Abnormality of  gait and mobility  Difficulty in walking, not elsewhere classified  Muscle weakness (generalized)  Unsteadiness on feet  Decreased range of motion of right shoulder  Other abnormalities of gait and mobility  Chronic right shoulder pain     Problem List Patient Active Problem List   Diagnosis Date Noted   Hemorrhoid 10/18/2021   Obesity, Class III, BMI 40-49.9 (morbid obesity) ([place])    Atrial fibrillation with RVR ([place]) 07/14/2020   Hypertension    Hemiparesis affecting right side as late effect of cerebrovascular accident ([place]) 08/13/2019   History of kidney stones 05/02/2018   ED (erectile dysfunction) 05/02/2018   Hyperglycemia 01/30/2017   Arthritis of knee, degenerative 01/30/2017   BPH with obstruction/lower urinary tract symptoms 02/03/2016   Gross hematuria 02/03/2016   Dyslipidemia 08/04/2015   Diverticulitis of large intestine without perforation or abscess without bleeding 06/30/2015   Edema 06/30/2015   OSA on CPAP 06/30/2015   Pura Spice, PT, DPT # [phone] 11/20/2021, 12:48 PM  [place] [facility] [address] [place], [place], [address] Phone: [phone]   Fax:  [phone]  Name: Tom Underwood MRN: [id number] Date of Birth: May 29, 1951

## 2021-11-25 ENCOUNTER — Ambulatory Visit: Payer: Medicare Other | Admitting: Physical Therapy

## 2021-11-25 ENCOUNTER — Encounter: Payer: Self-pay | Admitting: Physical Therapy

## 2021-11-25 ENCOUNTER — Other Ambulatory Visit: Payer: Self-pay

## 2021-11-25 DIAGNOSIS — R2681 Unsteadiness on feet: Secondary | ICD-10-CM | POA: Diagnosis not present

## 2021-11-25 DIAGNOSIS — R2689 Other abnormalities of gait and mobility: Secondary | ICD-10-CM | POA: Diagnosis not present

## 2021-11-25 DIAGNOSIS — M6281 Muscle weakness (generalized): Secondary | ICD-10-CM

## 2021-11-25 DIAGNOSIS — R269 Unspecified abnormalities of gait and mobility: Secondary | ICD-10-CM

## 2021-11-25 DIAGNOSIS — R262 Difficulty in walking, not elsewhere classified: Secondary | ICD-10-CM | POA: Diagnosis not present

## 2021-11-25 DIAGNOSIS — M25611 Stiffness of right shoulder, not elsewhere classified: Secondary | ICD-10-CM

## 2021-11-25 DIAGNOSIS — M25511 Pain in right shoulder: Secondary | ICD-10-CM

## 2021-11-25 NOTE — Therapy (Signed)
[place] [facility] [address] [place], [place], [address] Phone: [phone]   Fax:  [phone]  Physical Therapy Treatment  Patient Details  Name: Tom Underwood MRN: [id number] Date of Birth: Jul 29, 1951 Referring Provider (PT): Alba Cory, MD   Encounter Date: 11/25/2021    PT End of Session - 11/25/21 1245     Visit Number 5    Number of Visits 17    Date for PT Re-Evaluation 12/22/21    Authorization Type Medicare A&B; generic commercial (visit limits based on medical necessity)    Authorization - Visit Number 5    Authorization - Number of Visits 10    PT Start Time 1245 to 1342   Equipment Utilized During Treatment Gait belt    Activity Tolerance Patient tolerated treatment well    Behavior During Therapy [facility] for tasks assessed/performed             Past Medical History:  Diagnosis Date   Allergy    Decreased libido    Diverticulitis    GERD (gastroesophageal reflux disease)    HLD (hyperlipidemia)    Hydronephrosis with renal and ureteral calculus obstruction    Hydronephrosis with renal and ureteral calculus obstruction    Hypertension    Hypogonadism in male    Rhinitis, allergic    Sleep apnea    Stroke [facility])    Ureteral stone     Past Surgical History:  Procedure Laterality Date   EXTERNAL EAR SURGERY     FINGER SURGERY     KNEE SURGERY     left eye      There were no vitals filed for this visit.   Subjective Assessment - 11/25/21 1245     Pertinent History Tom Underwood is well known to PT clinic. Presenting to eval today for further LE strengthening, gait, and balance deficits. Reporting since stroke, MD told him he lost ~60% of his LE strength. Current baseline is household and short community stances with SPC. Does endorse difficulty in safe ability to get in/out of shower, donning/doffing socks indep, long distance ambulation, endorses wishing to improve his balance where he can use  two hands for ADL's and not feel like he needs to rely on SUE on an object for safety. Pt reports ability to safely perform STS but wishes to improve LE endurance. States he is safe with his 5 stairs to enter/exit home with use of handrail and SPC. Denies any falls in the last 6 months. These are all goals pt wishes to accomplish and improve upon.    Limitations Walking;Standing;Lifting;House hold activities    How long can you sit comfortably? unlimited    How long can you stand comfortably? 15-20 minutes    How long can you walk comfortably? 15 minutes    Patient Stated Goals Improve balance, LE strength/endurance.    Currently in Pain? Yes    Pain Score 4     Pain Location Knee    Pain Orientation Right;Left             Pt. reports no stumbles or falls and has remained active. B knee pain is grossly 4/10 with increase activity.       Ther.ex.:   Nustep L5 12 min. B UE/LE.  (completed 0.68 miles).  Nautilus: resisted walking at 95# 3x all 4-planes.  Cuing to increase hip flexion/ step pattern/ upright posture.     Nautilus (in standing): 50# lat. Pull downs/ 40# tricep extension/  50# scap. Retraction 30x each.     High marching/ alt. UE and LE touches/ lateral walking 3 laps.  Cuing for technique with alt. Touches.  Slight increase in L back/hip region.      Walking in hallway (2 laps) with cuing for consistent step pattern/ upright posture/ heel strike/ arm swing.          PT Short Term Goals - 11/25/21 1246       PT SHORT TERM GOAL #1   Title Pt will be indep with HEP to improve strength and balance to reduce risk of falls.    Baseline 11/23: initiated    Time 4    Period Weeks    Status Achieved    Target Date 11/24/21               PT Long Term Goals - 10/27/21 1421       PT LONG TERM GOAL #1   Title Pt will improve FOTO score to target to display improvement in functional mobility.    Baseline 11/23: 29/54    Time 8    Period Weeks    Status New     Target Date 12/22/21      PT LONG TERM GOAL #2   Title Pt wil improve TUG score to < 12 sec with no AD to display significant imporvement in reduced risk of falls    Baseline 11/23: 26 sec (did not sit down immediately leading to higher time than he would've scored)    Time 8    Period Weeks    Status New    Target Date 12/22/21      PT LONG TERM GOAL #3   Title Pt will improve DGI to >19/24 to indicate decreased risk of falls with household and community walking tasks    Baseline 11/23: 11/24    Time 8    Period Weeks    Status New    Target Date 12/22/21      PT LONG TERM GOAL #4   Title Pt will improve 30 sec STS >12 reps to display clinically significnat improvement in LE strength/endurance for functional mobility.    Baseline 11/23: 7 reps    Time 8    Period Weeks    Status New    Target Date 12/22/21              Pt. cautious with step pattern during resisted gait at Nautilus (all planes). No LOB during dynamic balance tasks or resisted gait. Pt. works hard during UE/LE resisted ther.ex. and minimal rest breaks required during tx. Pt. remains motivated and focused on improving standing/walking endurance. Pt. will continue to benefit from skilled PT services to increase UE/LE strengthening to improve pain-free mobility.       Patient will benefit from skilled therapeutic intervention in order to improve the following deficits and impairments:  Abnormal gait, Decreased activity tolerance, Decreased balance, Decreased coordination, Decreased mobility, Decreased endurance, Decreased range of motion, Decreased strength, Difficulty walking, Dizziness, Impaired UE functional use, Pain, Hypomobility  Visit Diagnosis: Abnormality of gait and mobility  Difficulty in walking, not elsewhere classified  Muscle weakness (generalized)  Unsteadiness on feet  Decreased range of motion of right shoulder  Other abnormalities of gait and mobility  Chronic right shoulder  pain     Problem List Patient Active Problem List   Diagnosis Date Noted   Hemorrhoid 10/18/2021   Obesity, Class III, BMI 40-49.9 (morbid obesity) (HCC)    Atrial  fibrillation with RVR ([place]) 07/14/2020   Hypertension    Hemiparesis affecting right side as late effect of cerebrovascular accident ([place]) 08/13/2019   History of kidney stones 05/02/2018   ED (erectile dysfunction) 05/02/2018   Hyperglycemia 01/30/2017   Arthritis of knee, degenerative 01/30/2017   BPH with obstruction/lower urinary tract symptoms 02/03/2016   Gross hematuria 02/03/2016   Dyslipidemia 08/04/2015   Diverticulitis of large intestine without perforation or abscess without bleeding 06/30/2015   Edema 06/30/2015   OSA on CPAP 06/30/2015   Pura Spice, PT, DPT # [id number] 11/30/2021, 12:21 PM  [place] [facility] [address] [place], [place], [address] Phone: [phone]   Fax:  [phone]  Name: Tom Underwood MRN: [id number] Date of Birth: 31-Jan-1951

## 2021-11-26 ENCOUNTER — Other Ambulatory Visit: Payer: Self-pay

## 2021-11-26 MED ORDER — FUROSEMIDE 20 MG PO TABS: 20.0000 mg | ORAL_TABLET | ORAL | 2 refills | Status: DC | PRN

## 2021-12-02 ENCOUNTER — Encounter: Payer: Medicare Other | Admitting: Physical Therapy

## 2021-12-07 ENCOUNTER — Telehealth: Payer: Self-pay | Admitting: Cardiology

## 2021-12-07 ENCOUNTER — Ambulatory Visit (INDEPENDENT_AMBULATORY_CARE_PROVIDER_SITE_OTHER): Payer: Medicare Other | Admitting: Psychologist

## 2021-12-07 DIAGNOSIS — F320 Major depressive disorder, single episode, mild: Secondary | ICD-10-CM

## 2021-12-07 DIAGNOSIS — Z634 Disappearance and death of family member: Secondary | ICD-10-CM | POA: Diagnosis not present

## 2021-12-07 NOTE — Progress Notes (Signed)
[facility] Counselor Initial Adult Exam  Name: Tom Underwood Date: 12/07/2021 MRN: [id number] DOB: 13-Jul-1951 PCP: Alba Cory, MD  Time spent: 1:05 pm to 1:37 pm; total time: 32 minutes  This session was held via video webex teletherapy due to the coronavirus risk at this time. The patient consented to video teletherapy and was located at his home during this session. He is aware it is the responsibility of the patient to secure confidentiality on his end of the session. The provider was in a private home office for the duration of this session. Limits of confidentiality were discussed with the patient.    Guardian/Payee:  NA    Paperwork requested: No   Reason for Visit /Presenting Problem: Patient endorsed experiencing depressive and grief related symptoms.   Mental Status Exam: Appearance:   Casual     Behavior:  Appropriate  Motor:  NA  Speech/Language:   Slow  Affect:  Appropriate  Mood:  normal  Thought process:  normal  Thought content:    WNL  Sensory/Perceptual disturbances:    WNL  Orientation:  oriented to person and place  Attention:  Good  Concentration:  Good  Memory:  WNL  Fund of knowledge:   Fair  Insight:    Fair  Judgment:   Fair  Impulse Control:  Good    Reported Symptoms: The patient endorsed experiencing the following: feeling down, sad, tearful, social isolation, avoiding pleasurable activities, rumination of negative thoughts, fatigue, and lack of motivation. He denied suicidal and homicidal ideation.   Risk Assessment: Danger to Self:  No Self-injurious Behavior: No Danger to Others: No Duty to Warn:no Physical Aggression / Violence:No  Access to Firearms a concern: No  Gang Involvement:No  Patient / guardian was educated about steps to take if suicide or homicide risk level increases between visits: n/a While future psychiatric events cannot be accurately predicted, the patient does not currently require acute  inpatient psychiatric care and does not currently meet [facility] involuntary commitment criteria.  Substance Abuse History: Current substance abuse: No     Past Psychiatric History:   No previous psychological problems have been observed Outpatient Providers:NA History of Psych Hospitalization: No  Psychological Testing:  NA    Abuse History:  Victim of: No.,  NA    Report needed: No. Victim of Neglect:No. Perpetrator of  NA   Witness / Exposure to Domestic Violence: No   Protective Services Involvement: No  Witness to [employer] Violence:  No   Family History:  Family History  Problem Relation Age of Onset   Asthma Mother    Heart disease Mother    Heart attack Mother    Dementia Mother    Asthma Father    Heart disease Father    Stroke Father     Living situation: the patient lives with their spouse.   Sexual Orientation: Straight  Relationship Status: married. Patient has been married to Di[place]ock for 7 years. They have been in a relationship for over 20 years. This is patient's second marriage. Patient's first marriage lasted 22 years.   Name of spouse / other:Donna  If a parent, number of children / ages: Patient has a son and daughter. All together he has five grandchildren  Support Systems: spouse friends  Financial Stress:  No   Income/Employment/Disability: Patient stated that he is retired.   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 History of legal issue / charges:  NA  Medical  History/Surgical History: reviewed Past Medical History:  Diagnosis Date   Allergy    Decreased libido    Diverticulitis    GERD (gastroesophageal reflux disease)    HLD (hyperlipidemia)    Hydronephrosis with renal and ureteral calculus obstruction    Hydronephrosis with renal and ureteral calculus obstruction    Hypertension    Hypogonadism in male    Rhinitis, allergic    Sleep apnea    Stroke [facility](HCC)    Ureteral stone     Past Surgical History:  Procedure Laterality Date   EXTERNAL EAR SURGERY     FINGER SURGERY     KNEE SURGERY     left eye      Medications: Current Outpatient Medications  Medication Sig Dispense Refill   acetaminophen (TYLENOL) 650 MG CR tablet Take 1,300 mg by mouth every evening.     amitriptyline (ELAVIL) 25 MG tablet TAKE 1 TABLET BY MOUTH AT BEDTIME AS NEEDED FOR SLEEP 90 tablet 0   buPROPion (WELLBUTRIN XL) 150 MG 24 hr tablet TAKE 1 TABLET BY MOUTH IN THE MORNING 90 tablet 0   busPIRone (BUSPAR) 10 MG tablet Take 1 tablet (10 mg total) by mouth 2 (two) times daily. 100 tablet 11   cetirizine (ZYRTEC) 10 MG tablet Take 10 mg by mouth in the morning.      Cholecalciferol (VITAMIN D) 125 MCG (5000 UT) CAPS Take 5,000 Units by mouth daily.     furosemide (LASIX) 20 MG tablet Take 1 tablet (20 mg total) by mouth as needed. For lower extremity swelling. 30 tablet 2   GLUCOSAMINE-CHONDROITIN PO Take 2 tablets by mouth in the morning and at bedtime.     Guaifenesin (MUCINEX MAXIMUM STRENGTH) 1200 MG TB12 Take 1,200 mg by mouth in the morning and at bedtime.     hydrocortisone (ANUSOL-HC) 25 MG suppository Place 1 suppository (25 mg total) rectally every 12 (twelve) hours. (Patient taking differently: Place 25 mg rectally 2 (two) times daily as needed for hemorrhoids.) 12 suppository 1   metoprolol tartrate (LOPRESSOR) 50 MG tablet Take 1 tablet (50 mg total) by mouth 2 (two) times daily. 180 tablet 0   Multiple Vitamin (MULTIVITAMIN) tablet Take 1 tablet by  mouth daily.     omega-3 acid ethyl esters (LOVAZA) 1 g capsule Take 2 capsules (2 g total) by mouth 2 (two) times daily. 360 capsule 1   pantoprazole (PROTONIX) 20 MG tablet Take 1 tablet by mouth once daily 90 tablet 0   rivaroxaban (XARELTO) 20 MG TABS tablet Take 1 tablet (20 mg total) by mouth daily with supper. 30 tablet 0   rosuvastatin (CRESTOR) 20 MG tablet Take 1 tablet (20 mg total) by mouth daily. 90 tablet 1   Saccharomyces boulardii (PROBIOTIC) 250 MG CAPS Take 1 capsule by mouth daily. 30 capsule 0   tamsulosin (FLOMAX) 0.4 MG CAPS capsule Take 1 capsule (0.4 mg total) by mouth daily. 90 capsule 0   valsartan (DIOVAN) 80 MG tablet Take 1 tablet (80 mg total) by mouth daily. 90 tablet 1   VISINE DRY EYE  RELIEF 1 % SOLN Place 1 drop into both eyes daily as needed (dry eyes).     No current facility-administered medications for this visit.    Allergies  Allergen Reactions   Penicillins Itching    Diagnoses: F32.0 major depressive affective disorder, single episode, mild and Z63.4 uncomplicated bereavement.   Plan of Care: The patient is a 71 year old Caucasian male who was referred due to experiencing depressive symptoms. The patient lives at home with his wife. The patient meets criteria for a diagnosis of F32.0 major depressive affective disorder, single episode, mild based off of the following: feeling down, sad, tearful, social isolation, avoiding pleasurable activities, rumination of negative thoughts, fatigue, and lack of motivation. He denied suicidal and homicidal ideation. He also meets criteria for a diagnosis of Z63.4 uncomplicated bereavement.  The patient stated that he wants coping skills, to work on not being stuck, and to find purpose again.  This psychologist makes the recommendation that the patient participate in at least bi-weekly therapy to assist him in meeting his goals.    Hilbert Corrigan, PsyD

## 2021-12-07 NOTE — Telephone Encounter (Addendum)
Patient calling for med instructions pre cardioversion tomorrow. Patient aware per nurse no med changes and he will make sure he doesn't miss his blood thinner .

## 2021-12-07 NOTE — Plan of Care (Signed)
Goals Alleviate depressive symptoms Recognize, accept, and cope with depressive feelings Develop healthy thinking patterns Develop healthy interpersonal relationships Begin a healthy grieving process  Objectives Cooperate with a medication evaluation by a physician Verbalize an accurate understanding of depression Verbalize an understanding of the treatment Identify and replace thoughts that support depression Learn and implement behavioral strategies Verbalize an understanding and resolution of current interpersonal problems Learn and implement problem solving and decision making skills Learn and implement conflict resolution skills to resolve interpersonal problems Verbalize an understanding of healthy and unhealthy emotions verbalize insight into how past relationships may be influence current experiences with depression Use mindfulness and acceptance strategies and increase value based behavior  IncreTell in detail the story of the current loss that is triggering symptoms Read books on the topic of grief Watch videos on the theme of grief Begin verbalizing feelings associated with the loss Attend a grief support group express thoughts and feelings about the deceased Identify and voice positives about the deceased implement acts of spiritual faith ase hopeful statements about the future.   Interventions Consistent with treatment model, discuss how change in cognitive, behavioral, and interpersonal can help client alleviate depression CBT Behavioral activation help the client explore the relationship, nature of the dispute,  Help the client develop new interpersonal skills and relationships Conduct Problem so living therapy Teach conflict resolution skills Use a process-experiential approach Conduct TLDP Conduct ACT Evaluate need for psychotropic medication Monitor adherence to medication  create a safe environment and actively build trust use empathy, compassion, and  support ask the patient to write a letter to the lost person conduct empty chair ask the patient to discuss and list the positives and negative aspects of the person encourage patient to rely upon his/her spiritual faith  ask client to read books on grief ask patient to watch videos about grief assist patient in identifying emotions  ask patient to attend support group    Problem: Depression CCP Problem  1 alleviate distress from depressive symptoms Goal: STG: [place] WILL PARTICIPATE IN AT LEAST 80% OF SCHEDULED INDIVIDUAL PSYCHOTHERAPY SESSIONS Outcome: Not Progressing Goal: STG: [place] WILL COMPLETE AT LEAST 80% OF ASSIGNED HOMEWORK Outcome: Not Progressing

## 2021-12-08 ENCOUNTER — Other Ambulatory Visit: Payer: Self-pay

## 2021-12-08 ENCOUNTER — Encounter: Payer: Self-pay | Admitting: Internal Medicine

## 2021-12-08 ENCOUNTER — Ambulatory Visit: Payer: Medicare Other | Admitting: Registered Nurse

## 2021-12-08 ENCOUNTER — Encounter
Admission: RE | Disposition: A | Discharge status: Home / Self Care | Payer: Self-pay | Source: Home / Self Care | Attending: Internal Medicine

## 2021-12-08 ENCOUNTER — Ambulatory Visit
Admission: RE | Admit: 2021-12-08 | Discharge: 2021-12-08 | Disposition: A | Discharge status: Home / Self Care | Payer: Medicare Other | Attending: Internal Medicine | Admitting: Internal Medicine

## 2021-12-08 DIAGNOSIS — I491 Atrial premature depolarization: Secondary | ICD-10-CM | POA: Diagnosis not present

## 2021-12-08 DIAGNOSIS — Z79899 Other long term (current) drug therapy: Secondary | ICD-10-CM | POA: Insufficient documentation

## 2021-12-08 DIAGNOSIS — Z87891 Personal history of nicotine dependence: Secondary | ICD-10-CM | POA: Diagnosis not present

## 2021-12-08 DIAGNOSIS — I10 Essential (primary) hypertension: Secondary | ICD-10-CM | POA: Diagnosis not present

## 2021-12-08 DIAGNOSIS — I4819 Other persistent atrial fibrillation: Secondary | ICD-10-CM

## 2021-12-08 DIAGNOSIS — K219 Gastro-esophageal reflux disease without esophagitis: Secondary | ICD-10-CM | POA: Diagnosis not present

## 2021-12-08 DIAGNOSIS — I4891 Unspecified atrial fibrillation: Secondary | ICD-10-CM | POA: Diagnosis not present

## 2021-12-08 DIAGNOSIS — G4733 Obstructive sleep apnea (adult) (pediatric): Secondary | ICD-10-CM | POA: Insufficient documentation

## 2021-12-08 HISTORY — PX: CARDIOVERSION: SHX1299

## 2021-12-08 SURGERY — CARDIOVERSION
Anesthesia: General

## 2021-12-08 MED ORDER — SODIUM CHLORIDE 0.9 % IV SOLN
INTRAVENOUS | Status: DC | PRN
Start: 2021-12-08 — End: 2021-12-08

## 2021-12-08 MED ORDER — DIPHENHYDRAMINE HCL 50 MG/ML IJ SOLN
INTRAMUSCULAR | Status: AC
  Filled 2021-12-08: qty 1

## 2021-12-08 MED ORDER — SODIUM CHLORIDE 0.9 % IV SOLN
Freq: Once | INTRAVENOUS | Status: AC
  Administered 2021-12-08: 1000 mL via INTRAVENOUS

## 2021-12-08 MED ORDER — PROPOFOL 10 MG/ML IV BOLUS
INTRAVENOUS | Status: AC
  Filled 2021-12-08: qty 20

## 2021-12-08 MED ORDER — DIPHENHYDRAMINE HCL 50 MG/ML IJ SOLN
25.0000 mg | Freq: Once | INTRAMUSCULAR | Status: AC
  Administered 2021-12-08: 25 mg via INTRAVENOUS

## 2021-12-08 MED ORDER — PROPOFOL 10 MG/ML IV BOLUS
INTRAVENOUS | Status: DC | PRN
  Administered 2021-12-08: 20 mg via INTRAVENOUS
  Administered 2021-12-08: 50 mg via INTRAVENOUS

## 2021-12-08 MED ORDER — METOPROLOL TARTRATE 50 MG PO TABS: 25.0000 mg | ORAL_TABLET | Freq: Two times a day (BID) | ORAL | 0 refills | Status: DC

## 2021-12-08 NOTE — Transfer of Care (Signed)
Immediate Anesthesia Transfer of Care Note  Patient: [name]  Procedure(s) Performed: CARDIOVERSION  Patient Location: PACU  Anesthesia Type:General  Level of Consciousness: drowsy  Airway & Oxygen Therapy: Patient Spontanous Breathing and Patient connected to nasal cannula oxygen  Post-op Assessment: Report given to RN and Post -op Vital signs reviewed and stable  Post vital signs: Reviewed and stable  Last Vitals:  Vitals Value Taken Time  BP 128/107 12/08/21 0733  Temp    Pulse 58 12/08/21 0734  Resp 15 12/08/21 0734  SpO2 96 % 12/08/21 0734    Last Pain:  Vitals:   12/08/21 0712  TempSrc: Oral  PainSc: 0-No pain         Complications: No notable events documented.

## 2021-12-08 NOTE — Anesthesia Postprocedure Evaluation (Addendum)
Anesthesia Post Note  Patient: Tom Underwood  Procedure(s) Performed: CARDIOVERSION  Patient location: Cardiovascular specials Suite. Anesthesia Type: General Level of consciousness: awake and alert Pain management: pain level controlled Vital Signs Assessment: post-procedure vital signs reviewed and stable Respiratory status: spontaneous breathing, nonlabored ventilation and respiratory function stable Cardiovascular status: blood pressure returned to baseline and stable Postop Assessment: no apparent nausea or vomiting Anesthetic complications: no Comments: Pt awoke from anesthesia after cardioversion complaining of burning and sharp pains under skin with distribution to entire body but greater in the lower abdomen and legs. He had occasional jerky movements in arms and legs that seemed to coincide with having pain. He did not have any focal neurological deficits. His vitals were within normal limits and he did not have a rash. He had severe pain to light touch for about 30 minutes. Benadryl was given for a possible allergic reaction to propofol. His symptoms improved to the point patient was discharged in good condition with return to ED precautions per cardiology. Propofol has been listed as an allergy per cardiology.    No notable events documented.   Last Vitals:  Vitals:   12/08/21 0830 12/08/21 0900  BP: (!) 126/97 (!) 112/95  Pulse: 65 (!) 57  Resp: 16 18  Temp:    SpO2: 98% 100%    Last Pain:  Vitals:   12/08/21 0900  TempSrc:   PainSc: 0-No pain                 Foye Deer

## 2021-12-08 NOTE — Interval H&P Note (Signed)
History and Physical Interval Note:  12/08/2021 7:21 AM  Tom Underwood  has presented today for surgery, with the diagnosis of persistent atrial fibrillation.  The various methods of treatment have been discussed with the patient and family. After consideration of risks, benefits and other options for treatment, the patient has consented to  Procedure(s): CARDIOVERSION (N/A) as a surgical intervention.  The patient's history has been reviewed, patient examined, no change in status, stable for surgery.  I have reviewed the patient's chart and labs.  Questions were answered to the patient's satisfaction.     Chasiti Waddington

## 2021-12-08 NOTE — CV Procedure (Signed)
° ° °  Cardioversion Note  Tom Underwood [id number] 1951-08-13  Procedure: DC Cardioversion Indications: Persistent atrial fibrillation  Procedure Details Consent: Obtained Time Out: Verified patient identification, verified procedure, site/side was marked, verified correct patient position, special equipment/implants available, Radiology Safety Procedures followed,  medications/allergies/relevent history reviewed, required imaging and test results available.  Performed  The patient has been on adequate anticoagulation.  The patient received IV propofol by anesthesia for sedation.  Synchronous cardioversion was performed at 200 joules x 1.  The cardioversion was successful with restoration of sinus rhythm.  Complications: No apparent complications Patient did tolerate procedure well.  Recommendations: - Decrease metoprolol tartrate to 25 mg BID due to post-cardioversion sinus bradycardia. - Continue rivaroxaban 20 mg daily.  Yvonne Kendall., MD 12/08/2021, 7:35 AM

## 2021-12-08 NOTE — Anesthesia Preprocedure Evaluation (Addendum)
Anesthesia Evaluation  Patient identified by MRN, date of birth, ID band Patient awake    Reviewed: Allergy & Precautions, NPO status , Patient's Chart, lab work & pertinent test results  Airway Mallampati: III  TM Distance: >3 FB Neck ROM: Full    Dental  (+) Poor Dentition   Pulmonary sleep apnea , former smoker,    Pulmonary exam normal breath sounds clear to auscultation       Cardiovascular hypertension, Pt. on medications Normal cardiovascular exam+ dysrhythmias Atrial Fibrillation  Rhythm:Irregular Rate:Normal     Neuro/Psych PSYCHIATRIC DISORDERS Depression CVA (2020, R sided weakness)    GI/Hepatic Neg liver ROS, GERD  Controlled,  Endo/Other  negative endocrine ROS  Renal/GU negative Renal ROS  negative genitourinary   Musculoskeletal  (+) Arthritis ,   Abdominal (+) + obese,   Peds negative pediatric ROS (+)  Hematology negative hematology ROS (+)   Anesthesia Other Findings   Reproductive/Obstetrics negative OB ROS                            Anesthesia Physical Anesthesia Plan  ASA: 3  Anesthesia Plan: General   Post-op Pain Management:    Induction:   PONV Risk Score and Plan:   Airway Management Planned: Natural Airway and Nasal Cannula  Additional Equipment:   Intra-op Plan:   Post-operative Plan:   Informed Consent: I have reviewed the patients History and Physical, chart, labs and discussed the procedure including the risks, benefits and alternatives for the proposed anesthesia with the patient or authorized representative who has indicated his/her understanding and acceptance.     Dental advisory given  Plan Discussed with: CRNA and Anesthesiologist  Anesthesia Plan Comments:        Anesthesia Quick Evaluation

## 2021-12-08 NOTE — Progress Notes (Signed)
Patient reassessed by Dr. Okey Dupre. Some improvement noted by patient. Patient elects to go home.

## 2021-12-08 NOTE — Progress Notes (Signed)
[facility]  Date: 12/08/21  Time: 10:48 AM  Patient underwent successful DCCV from a-fib to sinus bradycardia.  When he awakened from anesthesia, he complained of burning and stabbing pains throughout his body, particularly the abdomen and legs.  He also had intermittent involuntary movements, which seemed to coincide with bursts of the pain.  No rash was apparent.  Neurologic exam showed normal movement and strength in all 4 extremities.  Mouth and lips appear normal without swelling.  He was given diphenhydramine 25 mg IV x 1 with improvement in symptoms.  At the time of discharge, increased sensitivity of the skin overlying the chest and abdomen was still present but other findings had resolved.  I suspect this may have been a reaction to propofol, though anesthesia has not encountered this in the past.  I will add propofol to allergy list as an adverse reaction.  Patient remained hemodynamically and electrically stable throughout his recovery.  He is in agreement with d/c home.  I advised him to call 911 if he develops hives, worsening rash, facial/oral swelling, wheezing, difficulty breathing, or other concerning symptoms.  Yvonne Kendall, MD [facility]

## 2021-12-08 NOTE — Progress Notes (Signed)
Patient complaining of "bee stinging" to right groin and both ankles. Patient describes tingling/burning all over. Gets agitated when touched. Dr. Okey Dupre and Dr. [name] have assessed patient and have suggested that we continue to observe him at this time. Given 25MG  IV for possible allergic reaction.

## 2021-12-09 ENCOUNTER — Telehealth: Payer: Self-pay | Admitting: Internal Medicine

## 2021-12-09 NOTE — Telephone Encounter (Signed)
Patient is wanting know when he can go back to therapy following procedure, please assist

## 2021-12-10 NOTE — Telephone Encounter (Signed)
Called patient back and informed him that I will follow up with Dr. Garen Lah on Monday (12/13/21) when he is in office, and then call him back with his recommendation.   I also informed him that I will contact PT in [place] with clearance after it is received.

## 2021-12-13 ENCOUNTER — Other Ambulatory Visit: Payer: Self-pay | Admitting: Family Medicine

## 2021-12-13 ENCOUNTER — Encounter: Payer: Self-pay | Admitting: Cardiology

## 2021-12-13 ENCOUNTER — Other Ambulatory Visit: Payer: Self-pay

## 2021-12-13 DIAGNOSIS — F419 Anxiety disorder, unspecified: Secondary | ICD-10-CM

## 2021-12-13 DIAGNOSIS — N400 Enlarged prostate without lower urinary tract symptoms: Secondary | ICD-10-CM

## 2021-12-13 DIAGNOSIS — I10 Essential (primary) hypertension: Secondary | ICD-10-CM

## 2021-12-13 DIAGNOSIS — F341 Dysthymic disorder: Secondary | ICD-10-CM

## 2021-12-13 DIAGNOSIS — E785 Hyperlipidemia, unspecified: Secondary | ICD-10-CM

## 2021-12-13 MED ORDER — BUPROPION HCL ER (XL) 150 MG PO TB24: 150.0000 mg | ORAL_TABLET | Freq: Every morning | ORAL | 1 refills | Status: DC

## 2021-12-13 MED ORDER — OMEGA-3-ACID ETHYL ESTERS 1 G PO CAPS: 2.0000 g | ORAL_CAPSULE | Freq: Two times a day (BID) | ORAL | 1 refills | Status: DC

## 2021-12-13 MED ORDER — VALSARTAN 80 MG PO TABS: 80.0000 mg | ORAL_TABLET | Freq: Every day | ORAL | 0 refills | Status: DC

## 2021-12-13 MED ORDER — RIVAROXABAN 20 MG PO TABS: 20.0000 mg | ORAL_TABLET | Freq: Every day | ORAL | 5 refills | Status: DC

## 2021-12-13 MED ORDER — TAMSULOSIN HCL 0.4 MG PO CAPS: 0.4000 mg | ORAL_CAPSULE | Freq: Every day | ORAL | 1 refills | Status: DC

## 2021-12-13 MED ORDER — BUSPIRONE HCL 10 MG PO TABS: 10.0000 mg | ORAL_TABLET | Freq: Two times a day (BID) | ORAL | 5 refills | Status: DC

## 2021-12-13 MED ORDER — FUROSEMIDE 20 MG PO TABS: 20.0000 mg | ORAL_TABLET | ORAL | 1 refills | Status: DC | PRN

## 2021-12-13 MED ORDER — ROSUVASTATIN CALCIUM 20 MG PO TABS: 20.0000 mg | ORAL_TABLET | Freq: Every day | ORAL | 0 refills | Status: DC

## 2021-12-13 NOTE — Telephone Encounter (Signed)
Pt was advise to call Dr. Carlynn Purl to remind her that this year pt will be using [facility] in [place] instead of [facility] [address] and needs refills for all medications/ pt stated he contacted pharmacy but was told to call office   buPROPion (WELLBUTRIN XL) 150 MG 24 hr  tamsulosin (FLOMAX) 0.4 MG CAPS capsule   omega-3 acid ethyl esters (LOVAZA) 1 g capsule  rosuvastatin (CRESTOR) 20 MG tablet   valsartan (DIOVAN) 80 MG tablet   busPIRone (BUSPAR) 10 MG tablet   [id number] - [place] - [address] AT [facility] OF [address] [facility] Phone:  [phone]  Fax:  [phone]

## 2021-12-13 NOTE — Telephone Encounter (Signed)
Refill request

## 2021-12-13 NOTE — Telephone Encounter (Signed)
*  STAT* If patient is at the pharmacy, call can be transferred to refill team.   1. Which medications need to be refilled? (please list name of each medication and dose if known)  Valsartan, Crestor, Xarelto, Lasix 2. Which pharmacy/location (including street and city if local pharmacy) is medication to be sent to? [name] [place]  3. Do they need a 30 day or 90 day supply? 90

## 2021-12-13 NOTE — Telephone Encounter (Signed)
Prescription refill request for Xarelto received.  Indication: Atrial fib Last office visit: 11/15/21  B Agbor-Etang MD Weight: 130.6kg Age: 71 Scr: 1.03 on 11/15/21 CrCl: 126.80  Based on above findings Xarelto 20mg  daily is the appropriate dose.  Refill approved.

## 2021-12-13 NOTE — Telephone Encounter (Signed)
This encounter was created in error - please disregard.

## 2021-12-13 NOTE — Telephone Encounter (Signed)
Spoke with Dr. Azucena Cecil and he recommended that the patient may return to PT without restriction. Letter has been placed in chart. Will route encounter to Dorene Grebe, PT at [facility] outpatient rehab in [place].

## 2021-12-13 NOTE — Telephone Encounter (Signed)
Requested Prescriptions  Pending Prescriptions Disp Refills   rosuvastatin (CRESTOR) 20 MG tablet 90 tablet 1    Sig: Take 1 tablet (20 mg total) by mouth daily.     Cardiovascular:  Antilipid - Statins Passed - 12/13/2021 12:47 PM      Passed - Total Cholesterol in normal range and within 360 days    Cholesterol, Total  Date Value Ref Range Status  05/28/2021 120 100 - 199 mg/dL Final         Passed - LDL in normal range and within 360 days    LDL Chol Calc (NIH)  Date Value Ref Range Status  05/28/2021 53 0 - 99 mg/dL Final         Passed - HDL in normal range and within 360 days    HDL  Date Value Ref Range Status  05/28/2021 51 >39 mg/dL Final         Passed - Triglycerides in normal range and within 360 days    Triglycerides  Date Value Ref Range Status  05/28/2021 79 0 - 149 mg/dL Final         Passed - Patient is not pregnant      Passed - Valid encounter within last 12 months    Recent Outpatient Visits          1 month ago Gait instability   [facility] CorySowles, Krichna, MD   1 month ago Need for influenza vaccination   [facility] [place] DenseRumball, Alison M, DO   6 months ago Acute diverticulitis   [facility] [place] CorySowles, Krichna, MD   7 months ago Hypertension, benign   [facility] CorySowles, Krichna, MD   1 year ago Atrial fibrillation with RVR [facility](HCC)   [facility] Alba CorySowles, Krichna, MD      Future Appointments            In 1 month Agbor-Etang, Arlys JohnBrian, MD [facility] [place], [facility]   In 4 months  [facility], [facility]   In 5 months Alba CorySowles, Krichna, MD [facility], [facility]   In 6 months McGowan, Wellington HampshireShannon A, PA-C [facility] [place]            valsartan (DIOVAN) 80 MG tablet 90 tablet 1    Sig: Take 1 tablet (80 mg total) by mouth daily.     Cardiovascular:  Angiotensin Receptor Blockers Failed - 12/13/2021  12:47 PM      Failed - Last BP in normal range    BP Readings from Last 1 Encounters:  12/08/21 (!) 112/95         Passed - Cr in normal range and within 180 days    Creatinine, Ser  Date Value Ref Range Status  11/15/2021 1.03 0.76 - 1.27 mg/dL Final         Passed - K in normal range and within 180 days    Potassium  Date Value Ref Range Status  11/15/2021 4.2 3.5 - 5.2 mmol/L Final         Passed - Patient is not pregnant      Passed - Valid encounter within last 6 months    Recent Outpatient Visits          1 month ago Gait instability   [facility] CorySowles, Krichna, MD   1 month ago Need for influenza vaccination   [facility] Caro LarocheRumball, Alison M, [place]  6 months ago Acute diverticulitis   [facility] Alba Cory, MD   7 months ago Hypertension, benign   [facility] Alba Cory, MD   1 year ago Atrial fibrillation with RVR [facility])   [facility] Alba Cory, MD      Future Appointments            In 1 month Agbor-Etang, Arlys John, MD [facility]   In 4 months  [facility], [place]   In 5 months Alba Cory, MD [facility], [facility]   In 6 months McGowan, Elana Alm [facility] [place]           Signed Prescriptions Disp Refills   busPIRone (BUSPAR) 10 MG tablet 100 tablet 5    Sig: Take 1 tablet (10 mg total) by mouth 2 (two) times daily.     Psychiatry: Anxiolytics/Hypnotics - Non-controlled Passed - 12/13/2021 12:47 PM      Passed - Valid encounter within last 6 months    Recent Outpatient Visits          1 month ago Gait instability   [facility] Alba Cory, MD   1 month ago Need for influenza vaccination   [facility] [place] Dense M, DO   6 months ago Acute diverticulitis   [facility] Alba Cory,  MD   7 months ago Hypertension, benign   [facility] Alba Cory, MD   1 year ago Atrial fibrillation with RVR [facility])   [facility] Alba Cory, MD      Future Appointments            In 1 month Agbor-Etang, Arlys John, MD [facility], [facility]   In 4 months  [facility], [place]   In 5 months Alba Cory, MD [facility], [facility]   In 6 months McGowan, Wellington Hampshire, [place] [facility] [place]            tamsulosin (FLOMAX) 0.4 MG CAPS capsule 90 capsule 1    Sig: Take 1 capsule (0.4 mg total) by mouth daily.     Urology: Alpha-Adrenergic Blocker Failed - 12/13/2021 12:47 PM      Failed - Last BP in normal range    BP Readings from Last 1 Encounters:  12/08/21 (!) 112/95         Passed - Valid encounter within last 12 months    Recent Outpatient Visits          1 month ago Gait instability   [facility] Alba Cory, MD   1 month ago Need for influenza vaccination   [facility] [place] Dense M, DO   6 months ago Acute diverticulitis   [facility]ba Cory, MD   7 months ago Hypertension, benign   [facility] Alba Cory, MD   1 year ago Atrial fibrillation with RVR [facility])   [facility] Alba Cory, MD      Future Appointments            In 1 month Agbor-Etang, Arlys John, MD [facility], [facility]   In 4 months  [facility], [facility]   In 5 months Alba Cory, MD [facility], [facility]   In 6 months McGowan, Elana Alm [facility] [place]  buPROPion (WELLBUTRIN XL) 150 MG 24 hr tablet 90 tablet 1    Sig: Take 1 tablet (150 mg total) by mouth every morning.     Psychiatry: Antidepressants - bupropion Failed - 12/13/2021 12:47 PM      Failed - Last BP in normal range     BP Readings from Last 1 Encounters:  12/08/21 (!) 112/95         Passed - Valid encounter within last 6 months    Recent Outpatient Visits          1 month ago Gait instability   [facility] Alba Cory, MD   1 month ago Need for influenza vaccination   [facility] [place] Dense M, DO   6 months ago Acute diverticulitis   [facility] Al[facility] Cory, MD   7 months ago Hypertension, benign   [facility] Alba Cory, MD   1 year ago Atrial fibrillation with RVR [facility])   [facility] Alba Cory, MD      Future Appointments            In 1 month Agbor-Etang, Arlys John, MD [facility], [facility]   In 4 months  [facility], [facility]   In 5 months Alba Cory, MD [facility], [facility]   In 6 months McGowan, Wellington Hampshire, [place] [facility] [place]            omega-3 acid ethyl esters (LOVAZA) 1 g capsule 360 capsule 1    Sig: Take 2 capsules (2 g total) by mouth 2 (two) times daily.     Endocrinology:  Nutritional Agents Passed - 12/13/2021 12:47 PM      Passed - Valid encounter within last 12 months    Recent Outpatient Visits          1 month ago Gait instability   [facility] Alba Cory, MD   1 month ago Need for influenza vaccination   [facility] Caro Laroche, DO   6 months ago Acute diverticulitis   [facility] Alba Cory, MD   7 months ago Hypertension, benign   [facility] Alba Cory, MD   1 year ago Atrial fibrillation with RVR [facility])   [facility] Alba Cory, MD      Future Appointments            In 1 month Agbor-Etang, Arlys John, MD [facility], [facility]   In 4 months  [facility], [place]   In 5 months Alba Cory, MD [facility], [facility]   In 6 months McGowan, Elana Alm [facility] [place]

## 2021-12-13 NOTE — Telephone Encounter (Signed)
Requested Prescriptions  Pending Prescriptions Disp Refills   busPIRone (BUSPAR) 10 MG tablet 100 tablet 5    Sig: Take 1 tablet (10 mg total) by mouth 2 (two) times daily.     Psychiatry: Anxiolytics/Hypnotics - Non-controlled Passed - 12/13/2021 12:47 PM      Passed - Valid encounter within last 6 months    Recent Outpatient Visits          1 month ago Gait instability   [facility] Alba Cory, MD   1 month ago Need for influenza vaccination   [facility] [place] Dense M, DO   6 months ago Acute diverticulitis   [facility] Alba Cory, MD   7 months ago Hypertension, benign   [facility] Alba Cory, MD   1 year ago Atrial fibrillation with RVR [facility])   [facility]ba Cory, MD      Future Appointments            In 1 month Agbor-Etang, Arlys John, MD [facility], [facility]   In 4 months  [facility], [place]   In 5 months Alba Cory, MD [facility], [facility]   In 6 months McGowan, Wellington Hampshire, [place] [facility] [place]            tamsulosin (FLOMAX) 0.4 MG CAPS capsule 90 capsule 1    Sig: Take 1 capsule (0.4 mg total) by mouth daily.     Urology: Alpha-Adrenergic Blocker Failed - 12/13/2021 12:47 PM      Failed - Last BP in normal range    BP Readings from Last 1 Encounters:  12/08/21 (!) 112/95         Passed - Valid encounter within last 12 months    Recent Outpatient Visits          1 month ago Gait instability   [facility] Alba Cory, MD   1 month ago Need for influenza vaccination   [facility] [place] Dense M, DO   6 months ago Acute diverticulitis   [facility] Alba Cory, MD   7 months ago Hypertension, benign   [facility] Alba Cory, MD   1 year ago Atrial fibrillation with  RVR [facility])   [facility] Alba Cory, MD      Future Appointments            In 1 month Agbor-Etang, Arlys John, MD [facility], [facility]   In 4 months  [facility], [facility]   In 5 months Alba Cory, MD [facility], [facility]   In 6 months McGowan, Wellington Hampshire, PA-C [place] [facility] [place]            rosuvastatin (CRESTOR) 20 MG tablet 90 tablet 1    Sig: Take 1 tablet (20 mg total) by mouth daily.     Cardiovascular:  Antilipid - Statins Passed - 12/13/2021 12:47 PM      Passed - Total Cholesterol in normal range and within 360 days    Cholesterol, Total  Date Value Ref Range Status  05/28/2021 120 100 - 199 mg/dL Final         Passed - LDL in normal range and within 360 days    LDL Chol Calc (NIH)  Date Value Ref Range Status  05/28/2021 53 0 - 99 mg/dL Final  Passed - HDL in normal range and within 360 days    HDL  Date Value Ref Range Status  05/28/2021 51 >39 mg/dL Final         Passed - Triglycerides in normal range and within 360 days    Triglycerides  Date Value Ref Range Status  05/28/2021 79 0 - 149 mg/dL Final         Passed - Patient is not pregnant      Passed - Valid encounter within last 12 months    Recent Outpatient Visits          1 month ago Gait instability   [facility] Cory, MD   1 month ago Need for influenza vaccination   [facility] [place] Dense M, DO   6 months ago Acute diverticulitis   [facility] Alba Cory, MD   7 months ago Hypertension, benign   [facility] Alba Cory, MD   1 year ago Atrial fibrillation with RVR [facility])   [facility] Alba Cory, MD      Future Appointments            In 1 month Agbor-Etang, Arlys John, MD [facility], [facility]   In 4 months  [facility], [facility]    In 5 months Alba Cory, MD [facility], [facility]   In 6 months McGowan, Wellington Hampshire, PA-C [facility] [place]            valsartan (DIOVAN) 80 MG tablet 90 tablet 1    Sig: Take 1 tablet (80 mg total) by mouth daily.     Cardiovascular:  Angiotensin Receptor Blockers Failed - 12/13/2021 12:47 PM      Failed - Last BP in normal range    BP Readings from Last 1 Encounters:  12/08/21 (!) 112/95         Passed - Cr in normal range and within 180 days    Creatinine, Ser  Date Value Ref Range Status  11/15/2021 1.03 0.76 - 1.27 mg/dL Final         Passed - K in normal range and within 180 days    Potassium  Date Value Ref Range Status  11/15/2021 4.2 3.5 - 5.2 mmol/L Final         Passed - Patient is not pregnant      Passed - Valid encounter within last 6 months    Recent Outpatient Visits          1 month ago Gait instability   [facility] Alba Cory, MD   1 month ago Need for influenza vaccination   [facility] [place] Dense M, DO   6 months ago Acute diverticulitis   [facility] Alba Cory, MD   7 months ago Hypertension, benign   [facility] Alba Cory, MD   1 year ago Atrial fibrillation with RVR [facility])   [facility] Alba Cory, MD      Future Appointments            In 1 month Agbor-Etang, Arlys John, MD [facility], [facility]   In 4 months  [facility], [facility]   In 5 months Alba Cory, MD [facility], [facility]   In 6 months McGowan, Elana Alm [facility] [place]  buPROPion (WELLBUTRIN XL) 150 MG 24 hr tablet 90 tablet 1    Sig: Take 1 tablet (150 mg total) by mouth every morning.     Psychiatry: Antidepressants - bupropion Failed - 12/13/2021 12:47 PM      Failed - Last BP in normal range    BP Readings from Last 1  Encounters:  12/08/21 (!) 112/95         Passed - Valid encounter within last 6 months    Recent Outpatient Visits          1 month ago Gait instability   [facility] Alba CorySowles, Krichna, MD   1 month ago Need for influenza vaccination   [facility] [place] DenseRumball, Alison M, DO   6 months ago Acute diverticulitis   [facility]ba CorySowles, Krichna, MD   7 months ago Hypertension, benign   [facility] Alba CorySowles, Krichna, MD   1 year ago Atrial fibrillation with RVR [facility](HCC)   [facility] Alba CorySowles, Krichna, MD      Future Appointments            In 1 month Agbor-Etang, Arlys JohnBrian, MD [facility] [place], [facility]   In 4 months  [facility], [facility]   In 5 months Alba CorySowles, Krichna, MD [facility], [facility]   In 6 months McGowan, Wellington HampshireShannon A, [place] [facility] [place]            omega-3 acid ethyl esters (LOVAZA) 1 g capsule 360 capsule 1    Sig: Take 2 capsules (2 g total) by mouth 2 (two) times daily.     Endocrinology:  Nutritional Agents Passed - 12/13/2021 12:47 PM      Passed - Valid encounter within last 12 months    Recent Outpatient Visits          1 month ago Gait instability   [facility] Alba CorySowles, Krichna, MD   1 month ago Need for influenza vaccination   [facility] Caro LarocheRumball, Alison M, DO   6 months ago Acute diverticulitis   [facility] Alba CorySowles, Krichna, MD   7 months ago Hypertension, benign   [facility] Alba CorySowles, Krichna, MD   1 year ago Atrial fibrillation with RVR [facility](HCC)   [facility] Alba CorySowles, Krichna, MD      Future Appointments            In 1 month Agbor-Etang, Arlys JohnBrian, MD [facility] [place], [facility]   In 4 months  [facility], [place]   In 5 months Alba CorySowles, Krichna, MD [facility], [facility]   In  6 months McGowan, Elana AlmShannon A, PA-C [facility] [place]

## 2021-12-13 NOTE — Telephone Encounter (Signed)
Called and left a detailed VM for patient per DPR on file informing him that I have sent the okay for his PT to resume as documented in the previous documentation.

## 2021-12-16 ENCOUNTER — Encounter: Payer: Self-pay | Admitting: Physical Therapy

## 2021-12-16 ENCOUNTER — Other Ambulatory Visit: Payer: Self-pay

## 2021-12-16 ENCOUNTER — Ambulatory Visit: Payer: Medicare Other | Attending: Neurology | Admitting: Physical Therapy

## 2021-12-16 VITALS — BP 139/64 | HR 51

## 2021-12-16 DIAGNOSIS — M25511 Pain in right shoulder: Secondary | ICD-10-CM | POA: Insufficient documentation

## 2021-12-16 DIAGNOSIS — M7501 Adhesive capsulitis of right shoulder: Secondary | ICD-10-CM | POA: Diagnosis not present

## 2021-12-16 DIAGNOSIS — R2681 Unsteadiness on feet: Secondary | ICD-10-CM | POA: Diagnosis not present

## 2021-12-16 DIAGNOSIS — R279 Unspecified lack of coordination: Secondary | ICD-10-CM | POA: Diagnosis not present

## 2021-12-16 DIAGNOSIS — G8929 Other chronic pain: Secondary | ICD-10-CM | POA: Insufficient documentation

## 2021-12-16 DIAGNOSIS — M7502 Adhesive capsulitis of left shoulder: Secondary | ICD-10-CM | POA: Diagnosis not present

## 2021-12-16 DIAGNOSIS — R269 Unspecified abnormalities of gait and mobility: Secondary | ICD-10-CM | POA: Diagnosis not present

## 2021-12-16 DIAGNOSIS — R262 Difficulty in walking, not elsewhere classified: Secondary | ICD-10-CM | POA: Diagnosis not present

## 2021-12-16 DIAGNOSIS — M25611 Stiffness of right shoulder, not elsewhere classified: Secondary | ICD-10-CM | POA: Insufficient documentation

## 2021-12-16 DIAGNOSIS — R2689 Other abnormalities of gait and mobility: Secondary | ICD-10-CM | POA: Insufficient documentation

## 2021-12-16 DIAGNOSIS — M6281 Muscle weakness (generalized): Secondary | ICD-10-CM | POA: Diagnosis not present

## 2021-12-16 NOTE — Therapy (Addendum)
[facility] [address]. [place], [place], [address] Phone: [phone]   Fax:  [phone]  Physical Therapy Treatment  Patient Details  Name: Tom Underwood MRN: [id number] Date of Birth: 05-05-1951 Referring Provider (PT): Steele Sizer, MD   Encounter Date: 12/16/2021   PT End of Session - 12/16/21 1250     Visit Number 6    Number of Visits 17    Date for PT Re-Evaluation 12/22/21    Authorization Type Medicare A&B; generic commercial (visit limits based on medical necessity)    Authorization - Visit Number 6    Authorization - Number of Visits 10    PT Start Time 1251    PT Stop Time 1355    PT Time Calculation (min) 64 min    Equipment Utilized During Treatment Gait belt    Activity Tolerance Patient limited by fatigue;Patient limited by pain    Behavior During Therapy WFL for tasks assessed/performed             Past Medical History:  Diagnosis Date   Allergy    Decreased libido    Diverticulitis    GERD (gastroesophageal reflux disease)    HLD (hyperlipidemia)    Hydronephrosis with renal and ureteral calculus obstruction    Hydronephrosis with renal and ureteral calculus obstruction    Hypertension    Hypogonadism in male    Rhinitis, allergic    Sleep apnea    Stroke [facility])    Ureteral stone     Past Surgical History:  Procedure Laterality Date   CARDIOVERSION N/A 12/08/2021   Procedure: CARDIOVERSION;  Surgeon: Nelva Bush, MD;  Location: [facility] ORS;  Service: Cardiovascular;  Laterality: N/A;   EXTERNAL EAR SURGERY     FINGER SURGERY     KNEE SURGERY     left eye      Vitals:   12/16/21 1253  BP: 139/64  Pulse: (!) 51     Subjective Assessment - 12/16/21 1259     Subjective Pt. reports 6-7/10 in B knees today (discomfort over past couple days).  Pt. reports 3-4/10 in B shoulder.  Pt had recent Cardiac procedure with no issues (see MD notes).    Pertinent History Tom Underwood is  well known to PT clinic. Presenting to eval today for further LE strengthening, gait, and balance deficits. Reporting since stroke, MD told him he lost ~60% of his LE strength. Current baseline is household and short community stances with SPC. Does endorse difficulty in safe ability to get in/out of shower, donning/doffing socks indep, long distance ambulation, endorses wishing to improve his balance where he can use two hands for ADL's and not feel like he needs to rely on SUE on an object for safety. Pt reports ability to safely perform STS but wishes to improve LE endurance. States he is safe with his 5 stairs to enter/exit home with use of handrail and SPC. Denies any falls in the last 6 months. These are all goals pt wishes to accomplish and improve upon.    Limitations Walking;Standing;Lifting;House hold activities    How long can you sit comfortably? unlimited    How long can you stand comfortably? 15-20 minutes    How long can you walk comfortably? 15 minutes    Patient Stated Goals Improve balance, LE strength/endurance.    Currently in Pain? Yes    Pain Score 7     Pain Location Knee    Pain Orientation Right;Left  Pain Descriptors / Indicators Aching    Pain Type Chronic pain    Multiple Pain Sites Yes    Pain Score 4    Pain Location Shoulder    Pain Orientation Right;Left                  Ther.ex.:   Nustep L5 12 min. B UE/LE.  (completed 0.64 miles). Pulse ox. 94. Pulse 75 B pm  Nautilus (in standing): 50# lat. Pull downs/ 40# tricep extension/ 50# scap. Retraction 20x each.   Standing in //-bars Shoulder ER with green theraband 20X     High marching/ alt. UE and LE touches/ lateral walking 3 laps.  Cuing for technique with alt. Touches.  Slight increase in L back/hip region.    Step ups to 6' step in //-bars x10 on R LE, modified toe taps to 6' step on L LE due to increase of pain and fatigue of L LE   Walking in hallway (1 laps) with cuing for consistent  step pattern/ upright posture/ heel strike/ arm swing. Pt. Notes increase in L LE fatigue.             PT Short Term Goals - 11/25/21 1246       PT SHORT TERM GOAL #1   Title Pt will be indep with HEP to improve strength and balance to reduce risk of falls.    Baseline 11/23: initiated    Time 4    Period Weeks    Status Achieved    Target Date 11/24/21               PT Long Term Goals - 10/27/21 1421       PT LONG TERM GOAL #1   Title Pt will improve FOTO score to target to display improvement in functional mobility.    Baseline 11/23: 29/54    Time 8    Period Weeks    Status New    Target Date 12/22/21      PT LONG TERM GOAL #2   Title Pt wil improve TUG score to < 12 sec with no AD to display significant imporvement in reduced risk of falls    Baseline 11/23: 26 sec (did not sit down immediately leading to higher time than he would've scored)    Time 8    Period Weeks    Status New    Target Date 12/22/21      PT LONG TERM GOAL #3   Title Pt will improve DGI to >19/24 to indicate decreased risk of falls with household and community walking tasks    Baseline 11/23: 11/24    Time 8    Period Weeks    Status New    Target Date 12/22/21      PT LONG TERM GOAL #4   Title Pt will improve 30 sec STS >12 reps to display clinically significnat improvement in LE strength/endurance for functional mobility.    Baseline 11/23: 7 reps    Time 8    Period Weeks    Status New    Target Date 12/22/21                   Plan - 12/16/21 1300     Clinical Impression Statement Pt. notes a slight increase in LE pain at beginning of the session. Pt. is able to ambulate using a SPC with some cueing to stand upright and increase R hip flexion during step pattern. Pt. experiences  some SOB  during session post exercise but is motivated to push throughout the session.  Pts. O2 sats. remain >94% during ther.ex./ exertion.  Pt. is limited during step up exercise due  to L LE weakness and pain, which requires UE assist and SBA/CGA for safety. Pt. will continue to benefit from LE strengthening and gait cueing to continue to build confidence in his ability to ambulate and climb steps.    Personal Factors and Comorbidities Comorbidity 2    Comorbidities Hypertension, Obesity    Examination-Activity Limitations Bed Mobility;Reach Overhead;Squat;Lift;Dressing;Hygiene/Grooming    Examination-Participation Restrictions Community Activity;Yard Work    [employer] officer Evolving/Moderate complexity    Clinical Decision Making Moderate    Rehab Potential Fair    PT Frequency 2x / week    PT Duration 8 weeks    PT Treatment/Interventions ADLs/Self Care Home Management;Gait training;Stair training;Functional mobility training;Therapeutic activities;Therapeutic exercise;Balance training;Neuromuscular re-education;Manual techniques;Patient/family education;Electrical Stimulation;Moist Heat;Cryotherapy    PT Next Visit Plan Progress LE strengthening.   REASSESS GOALS/ RECERT NEXT TX SESSION    PT Home Exercise Plan Shoulder pulleys in flex, scaption, abd, pendelums, scap retractions with resistance (RTB), D2 flexion pattern in sitting no resistance.    Consulted and Agree with Plan of Care Patient             Patient will benefit from skilled therapeutic intervention in order to improve the following deficits and impairments:  Abnormal gait, Decreased activity tolerance, Decreased balance, Decreased coordination, Decreased mobility, Decreased endurance, Decreased range of motion, Decreased strength, Difficulty walking, Dizziness, Impaired UE functional use, Pain, Hypomobility  Visit Diagnosis: Abnormality of gait and mobility  Difficulty in walking, not elsewhere classified  Muscle weakness (generalized)  Unsteadiness on feet  Decreased range of motion of right shoulder  Other abnormalities of gait and mobility  Chronic right shoulder  pain  Unspecified lack of coordination  Adhesive capsulitis of both shoulders     Problem List Patient Active Problem List   Diagnosis Date Noted   Hemorrhoid 10/18/2021   Obesity, Class III, BMI 40-49.9 (morbid obesity) (HCC)    Atrial fibrillation with RVR (HCC) 07/14/2020   Persistent atrial fibrillation ([place]) 07/14/2020   Hypertension    Hemiparesis affecting right side as late effect of cerebrovascular accident ([place]) 08/13/2019   History of kidney stones 05/02/2018   ED (erectile dysfunction) 05/02/2018   Hyperglycemia 01/30/2017   Arthritis of knee, degenerative 01/30/2017   BPH with obstruction/lower urinary tract symptoms 02/03/2016   Gross hematuria 02/03/2016   Dyslipidemia 08/04/2015   Diverticulitis of large intestine without perforation or abscess without bleeding 06/30/2015   Edema 06/30/2015   OSA on CPAP 06/30/2015   Pura Spice, PT, DPT # [id number] Cleopatra Cedar, SPT 12/16/2021, 3:32 PM  [facility] [address] [place], [place], [address] Phone: [phone]   Fax:  [phone]  Name: Tom Underwood MRN: [id number] Date of Birth: [birth year]

## 2021-12-21 ENCOUNTER — Ambulatory Visit (INDEPENDENT_AMBULATORY_CARE_PROVIDER_SITE_OTHER): Payer: Medicare Other | Admitting: Psychologist

## 2021-12-21 DIAGNOSIS — Z634 Disappearance and death of family member: Secondary | ICD-10-CM | POA: Diagnosis not present

## 2021-12-21 DIAGNOSIS — F320 Major depressive disorder, single episode, mild: Secondary | ICD-10-CM

## 2021-12-21 NOTE — Progress Notes (Signed)
                Mansur Patti, PsyD 

## 2021-12-21 NOTE — Progress Notes (Signed)
[facility] Counselor/Therapist Progress Note  Patient ID: Tom Underwood, MRN: [id number],    Date: 12/21/2021  Time Spent: 1:20 pm to 1:40 pm; total time: 20 minutes   This session was held via phone teletherapy due to the coronavirus risk at this time. The patient consented to phone teletherapy and was located at his home during this session. He is aware it is the responsibility of the patient to secure confidentiality on his end of the session. The provider was in a private home office for the duration of this session. Limits of confidentiality were discussed with the patient.   Treatment Type: Individual Therapy  Reported Symptoms: Grief related symptoms.   Mental Status Exam: Appearance:  NA      Behavior: Appropriate  Motor: NA  Speech/Language:  Slow  Affect: Appropriate  Mood: normal  Thought process: normal  Thought content:   WNL  Sensory/Perceptual disturbances:   WNL  Orientation: oriented to person, place, and time/date  Attention: Fair  Concentration: Fair  Memory: WNL  Fund of knowledge:  Good  Insight:   Fair  Judgment:  Good  Impulse Control: Good   Risk Assessment: Danger to Self:  No Self-injurious Behavior: No Danger to Others: No Duty to Warn:no Physical Aggression / Violence:No  Access to Firearms a concern: No  Gang Involvement:No   Subjective: Beginning the session, the patient described himself as doing okay. He stated that he wanted to focus on grief related to the death of his mother. He did a life review of his mother and processed thoughts and emotions associated with that life review. He reflected on ways to honor his mother. Patient stated that he was open to writing her a letter, reading Tear Soup and watching a TED Talk. He was agreeable to homework and following up. He denied suicidal and homicidal ideation.    Interventions:  Worked on developing a therapeutic relationship with the patient using active listening and  reflective statements. Provided emotional support using empathy and validation. Reviewed the treatment plan with the patient. Reviewed events since the intake. Processed expressed thoughts and emotions. Assisted the patient in doing a life review of his mother. Processed ways that patient could honor his mother. Used socratic questions to assist the patient gain insight. Reflected on the idea of writing a letter to his mother. Provided psychoeducation about Tear Soup and Ted Talk video. Provided empathic statements. Assigned homework. Assessed for suicidal and homicidal ideation.   Homework: Write letter to mother, read [employer], and watch Ted Talk video  Next Session: Review homework and emotional support.   Diagnosis: F32.0 major depressive affective disorder, single episode, mild and Z63.4 uncomplicated bereavement.   Plan:   Client Abilities: Friendly and easy to develop rapport  Client Preferences:  ACT and CBT  Client statement of Needs: Strategies to move forward and process emotions  Treatment Level: Outpatient   Goals Alleviate depressive symptoms Recognize, accept, and cope with depressive feelings Develop healthy thinking patterns Develop healthy interpersonal relationships  Objectives target date for all objectives is 12/07/2022 Cooperate with a medication evaluation by a physician Verbalize an accurate understanding of depression Verbalize an understanding of the treatment Identify and replace thoughts that support depression Learn and implement behavioral strategies Verbalize an understanding and resolution of current interpersonal problems Learn and implement problem solving and decision making skills Learn and implement conflict resolution skills to resolve interpersonal problems Verbalize an understanding of healthy and unhealthy emotions verbalize insight into how past relationships may  be influence current experiences with depression Use mindfulness and acceptance  strategies and increase value based behavior  Increase hopeful statements about the future.  Interventions Consistent with treatment model, discuss how change in cognitive, behavioral, and interpersonal can help client alleviate depression CBT Behavioral activation help the client explore the relationship, nature of the dispute,  Help the client develop new interpersonal skills and relationships Conduct Problem so living therapy Teach conflict resolution skills Use a process-experiential approach Conduct TLDP Conduct ACT Evaluate need for psychotropic medication Monitor adherence to medication   Goals Begin a healthy grieving process Objectives target date for all objectives is 12/07/2022 Tell in detail the story of the current loss that is triggering symptoms Read books on the topic of grief Watch videos on the theme of grief Begin verbalizing feelings associated with the loss Attend a grief support group express thoughts and feelings about the deceased Identify and voice positives about the deceased implement acts of spiritual faith  Interventions create a safe environment and actively build trust use empathy, compassion, and support ask the patient to write a letter to the lost person conduct empty chair ask the patient to discuss and list the positives and negative aspects of the person encourage patient to rely upon his/her spiritual faith  ask client to read books on grief ask patient to watch videos about grief assist patient in identifying emotions  ask patient to attend support group   The patient and clinician reviewed the treatment plan on 12/21/2021. The patient approved of the treatment plan.    Hilbert Corrigan, PsyD

## 2021-12-23 ENCOUNTER — Telehealth: Payer: Self-pay | Admitting: Family Medicine

## 2021-12-23 ENCOUNTER — Other Ambulatory Visit: Payer: Self-pay

## 2021-12-23 ENCOUNTER — Ambulatory Visit: Payer: Medicare Other

## 2021-12-23 VITALS — BP 133/69 | HR 51

## 2021-12-23 DIAGNOSIS — M25611 Stiffness of right shoulder, not elsewhere classified: Secondary | ICD-10-CM | POA: Diagnosis not present

## 2021-12-23 DIAGNOSIS — R269 Unspecified abnormalities of gait and mobility: Secondary | ICD-10-CM

## 2021-12-23 DIAGNOSIS — R262 Difficulty in walking, not elsewhere classified: Secondary | ICD-10-CM | POA: Diagnosis not present

## 2021-12-23 DIAGNOSIS — R2689 Other abnormalities of gait and mobility: Secondary | ICD-10-CM | POA: Diagnosis not present

## 2021-12-23 DIAGNOSIS — R2681 Unsteadiness on feet: Secondary | ICD-10-CM | POA: Diagnosis not present

## 2021-12-23 DIAGNOSIS — M6281 Muscle weakness (generalized): Secondary | ICD-10-CM | POA: Diagnosis not present

## 2021-12-23 NOTE — Therapy (Signed)
[name] [address] [place], [place], [address] Phone: [phone]   Fax:  [phone]  Physical Therapy Treatment/Recertification  Patient Details  Name: Tom Underwood MRN: [id number] Date of Birth: 09-Nov-1951 Referring Provider (PT): Steele Sizer, MD   Encounter Date: 12/23/2021   PT End of Session - 12/23/21 1309     Visit Number 7    Number of Visits 25    Date for PT Re-Evaluation 02/17/22    Authorization Type Medicare A&B; generic commercial (visit limits based on medical necessity)    Authorization - Visit Number 7    Authorization - Number of Visits 10    PT Start Time 1300    PT Stop Time 1405    PT Time Calculation (min) 65 min    Equipment Utilized During Treatment Gait belt    Activity Tolerance Patient limited by fatigue;Patient limited by pain    Behavior During Therapy WFL for tasks assessed/performed             Past Medical History:  Diagnosis Date   Allergy    Decreased libido    Diverticulitis    GERD (gastroesophageal reflux disease)    HLD (hyperlipidemia)    Hydronephrosis with renal and ureteral calculus obstruction    Hydronephrosis with renal and ureteral calculus obstruction    Hypertension    Hypogonadism in male    Rhinitis, allergic    Sleep apnea    Stroke [facility])    Ureteral stone     Past Surgical History:  Procedure Laterality Date   CARDIOVERSION N/A 12/08/2021   Procedure: CARDIOVERSION;  Surgeon: Nelva Bush, MD;  Location: [facility] ORS;  Service: Cardiovascular;  Laterality: N/A;   EXTERNAL EAR SURGERY     FINGER SURGERY     KNEE SURGERY     left eye      Vitals:   12/23/21 1524  BP: 133/69  Pulse: (!) 51  SpO2: 96%     Subjective Assessment - 12/23/21 1524     Subjective Pt. reports some knee pain at the beginning of the session. Pt. states pain is better and is extremely motivated to push through the tx. session.    Pertinent History Tom Underwood  is well known to PT clinic. Presenting to eval today for further LE strengthening, gait, and balance deficits. Reporting since stroke, MD told him he lost ~60% of his LE strength. Current baseline is household and short community stances with SPC. Does endorse difficulty in safe ability to get in/out of shower, donning/doffing socks indep, long distance ambulation, endorses wishing to improve his balance where he can use two hands for ADL's and not feel like he needs to rely on SUE on an object for safety. Pt reports ability to safely perform STS but wishes to improve LE endurance. States he is safe with his 5 stairs to enter/exit home with use of handrail and SPC. Denies any falls in the last 6 months. These are all goals pt wishes to accomplish and improve upon.    Limitations Walking;Standing;Lifting;House hold activities    How long can you sit comfortably? unlimited    How long can you stand comfortably? 15-20 minutes    How long can you walk comfortably? 15 minutes    Patient Stated Goals Improve balance, LE strength/endurance.    Currently in Pain? Yes    Pain Score 4     Pain Location Knee  TREATMENT   Ther-ex  Updated outcome measures with patient: TUG- 2x (16.18/14.44) DGI- 12/24 30 sec STS: 10.5 reps  Nustep L5 12 min. B UE/LE.. Pulse ox. 96. Pulse 66 bpm Nautilus (in standing): 50# lat. Pull downs/ 40# tricep extension  Standing in //-bars Shoulder ER with green theraband 20X      Pt educated throughout session about proper posture and technique with exercises. Improved exercise technique, movement at target joints, use of target muscles after min to mod verbal, visual, tactile cues.                                          PT Short Term Goals - 12/24/21 0810       PT SHORT TERM GOAL #1   Title Pt will be indep with HEP to improve strength and balance to reduce risk of falls.    Baseline 11/23: initiated    Time 4     Period Weeks    Status Achieved               PT Long Term Goals - 12/23/21 1510       PT LONG TERM GOAL #1   Title Pt will improve FOTO score to target to display improvement in functional mobility.    Baseline 11/23: 29/54    Time 8    Period Weeks    Status New    Target Date 02/17/22      PT LONG TERM GOAL #2   Title Pt wil improve TUG score to < 12 sec with no AD to display significant imporvement in reduced risk of falls    Baseline 11/23: 26 sec (did not sit down immediately leading to higher time than he would've scored) 1/19: 14.44 sec    Time 8    Period Weeks    Status Partially Met    Target Date 02/17/22      PT LONG TERM GOAL #3   Title Pt will improve DGI to >19/24 to indicate decreased risk of falls with household and community walking tasks    Baseline 11/23: 11/24 1/19:12/24    Time 8    Period Weeks    Status Partially Met    Target Date 02/17/22      PT LONG TERM GOAL #4   Title Pt will improve 30 sec STS >12 reps to display clinically significnat improvement in LE strength/endurance for functional mobility.    Baseline 11/23: 7 reps 1/19: 10.5 reps    Time 8    Period Weeks    Status Partially Met    Target Date 02/17/22                   Plan - 12/23/21 1519     Clinical Impression Statement Pt. was able to ambulate with CGA and some use of a SPC. Updated outcome measures with patinet during session including TUG, DGI, and STS (See Long term goals). Pts. O2 sats. remain >96% during ther.ex./ exertion.Pt. is continuing to make steady progress torwards his long term goals and with more gait and endurance training will be able to acheive them. Pt. is highly motivated to progress and push to his goals and wants to continue to get better at ambulation and feeling more stable during ADL's    Personal Factors and Comorbidities Comorbidity 2    Comorbidities Hypertension, Obesity  Examination-Activity Limitations Bed Mobility;Reach  Overhead;Squat;Lift;Dressing;Hygiene/Grooming    Examination-Participation Restrictions Community Activity;Yard Work    Merchant [employer] officer Evolving/Moderate complexity    Rehab Potential Fair    PT Frequency 2x / week    PT Duration 8 weeks    PT Treatment/Interventions ADLs/Self Care Home Management;Gait training;Stair training;Functional mobility training;Therapeutic activities;Therapeutic exercise;Balance training;Neuromuscular re-education;Manual techniques;Patient/family education;Electrical Stimulation;Moist Heat;Cryotherapy    PT Next Visit Plan Progress LE strengthening.    PT Home Exercise Plan Shoulder pulleys in flex, scaption, abd, pendelums, scap retractions with resistance (RTB), D2 flexion pattern in sitting no resistance.    Consulted and Agree with Plan of Care Patient             Patient will benefit from skilled therapeutic intervention in order to improve the following deficits and impairments:  Abnormal gait, Decreased activity tolerance, Decreased balance, Decreased coordination, Decreased mobility, Decreased endurance, Decreased range of motion, Decreased strength, Difficulty walking, Dizziness, Impaired UE functional use, Pain, Hypomobility  Visit Diagnosis: Abnormality of gait and mobility  Difficulty in walking, not elsewhere classified  Muscle weakness (generalized)     Problem List Patient Active Problem List   Diagnosis Date Noted   Hemorrhoid 10/18/2021   Obesity, Class III, BMI 40-49.9 (morbid obesity) (HCC)    Atrial fibrillation with RVR (HCC) 07/14/2020   Persistent atrial fibrillation ([place]) 07/14/2020   Hypertension    Hemiparesis affecting right side as late effect of cerebrovascular accident ([place]) 08/13/2019   History of kidney stones 05/02/2018   ED (erectile dysfunction) 05/02/2018   Hyperglycemia 01/30/2017   Arthritis of knee, degenerative 01/30/2017   BPH with obstruction/lower urinary tract symptoms 02/03/2016    Gross hematuria 02/03/2016   Dyslipidemia 08/04/2015   Diverticulitis of large intestine without perforation or abscess without bleeding 06/30/2015   Edema 06/30/2015   OSA on CPAP 06/30/2015   Phillips Grout PT, DPT, GCS  Skyelar Swigart SPT Huprich,Jason, PT 12/24/2021, 8:14 AM  [facility] [address] [place], [place], [address] Phone: [phone]   Fax:  [phone]  Name: Tom Underwood MRN: [id number] Date of Birth: Feb 13, 1951

## 2021-12-23 NOTE — Telephone Encounter (Signed)
Pt called in for assistance. Pt says that he received a Rx for rivaroxaban (XARELTO) 20 MG TABS tablet . Pt says that it would more cost affective if he get a 90 day supply instead of how his Rx is written now. Pt would like to have Rx updated with his pharmacy.

## 2021-12-24 ENCOUNTER — Other Ambulatory Visit: Payer: Self-pay | Admitting: Family Medicine

## 2021-12-24 ENCOUNTER — Other Ambulatory Visit: Payer: Self-pay

## 2021-12-24 NOTE — Telephone Encounter (Signed)
Left VM letting patient know to reach out to prescribing physician.

## 2021-12-27 ENCOUNTER — Other Ambulatory Visit: Payer: Self-pay

## 2021-12-27 ENCOUNTER — Ambulatory Visit: Payer: Medicare Other | Admitting: Physical Therapy

## 2021-12-27 ENCOUNTER — Encounter: Payer: Self-pay | Admitting: Physical Therapy

## 2021-12-27 DIAGNOSIS — M6281 Muscle weakness (generalized): Secondary | ICD-10-CM

## 2021-12-27 DIAGNOSIS — R269 Unspecified abnormalities of gait and mobility: Secondary | ICD-10-CM | POA: Diagnosis not present

## 2021-12-27 DIAGNOSIS — R262 Difficulty in walking, not elsewhere classified: Secondary | ICD-10-CM | POA: Diagnosis not present

## 2021-12-27 DIAGNOSIS — R2689 Other abnormalities of gait and mobility: Secondary | ICD-10-CM | POA: Diagnosis not present

## 2021-12-27 DIAGNOSIS — R2681 Unsteadiness on feet: Secondary | ICD-10-CM | POA: Diagnosis not present

## 2021-12-27 DIAGNOSIS — M25611 Stiffness of right shoulder, not elsewhere classified: Secondary | ICD-10-CM | POA: Diagnosis not present

## 2021-12-27 NOTE — Therapy (Addendum)
[place] [name] [address]. [place], [place], [address] Phone: [phone]   Fax:  [phone]  Physical Therapy Treatment  Patient Details  Name: Tom Underwood MRN: [id number] Date of Birth: 1951-08-14 Referring Provider (PT): Steele Sizer, MD   Encounter Date: 12/27/2021   PT End of Session - 12/27/21 1218     Visit Number 8    Number of Visits 25    Date for PT Re-Evaluation 02/17/22    Authorization Type Medicare A&B; generic commercial (visit limits based on medical necessity)    Authorization - Visit Number 8    Authorization - Number of Visits 10    PT Start Time 1102    PT Stop Time 1209    PT Time Calculation (min) 67 min    Equipment Utilized During Treatment Gait belt    Activity Tolerance Patient limited by fatigue;Patient limited by pain    Behavior During Therapy WFL for tasks assessed/performed             Past Medical History:  Diagnosis Date   Allergy    Decreased libido    Diverticulitis    GERD (gastroesophageal reflux disease)    HLD (hyperlipidemia)    Hydronephrosis with renal and ureteral calculus obstruction    Hydronephrosis with renal and ureteral calculus obstruction    Hypertension    Hypogonadism in male    Rhinitis, allergic    Sleep apnea    Stroke [facility])    Ureteral stone     Past Surgical History:  Procedure Laterality Date   CARDIOVERSION N/A 12/08/2021   Procedure: CARDIOVERSION;  Surgeon: Nelva Bush, MD;  Location: [facility] ORS;  Service: Cardiovascular;  Laterality: N/A;   EXTERNAL EAR SURGERY     FINGER SURGERY     KNEE SURGERY     left eye      There were no vitals filed for this visit.   Subjective Assessment - 12/27/21 1120     Subjective Pt. reports increase in knee pain since last visit. Pt. has MD appointment Wednesday to check post Cardioversion.    Pertinent History Tom Underwood is well known to PT clinic. Presenting to eval today for further LE  strengthening, gait, and balance deficits. Reporting since stroke, MD told him he lost ~60% of his LE strength. Current baseline is household and short community stances with SPC. Does endorse difficulty in safe ability to get in/out of shower, donning/doffing socks indep, long distance ambulation, endorses wishing to improve his balance where he can use two hands for ADL's and not feel like he needs to rely on SUE on an object for safety. Pt reports ability to safely perform STS but wishes to improve LE endurance. States he is safe with his 5 stairs to enter/exit home with use of handrail and SPC. Denies any falls in the last 6 months. These are all goals pt wishes to accomplish and improve upon.    Limitations Walking;Standing;Lifting;House hold activities    How long can you sit comfortably? unlimited    How long can you stand comfortably? 15-20 minutes    How long can you walk comfortably? 15 minutes    Patient Stated Goals Improve balance, LE strength/endurance.    Currently in Pain? Yes    Pain Score 6     Pain Location Knee    Pain Score 2    Pain Location Shoulder              Ther.ex.:  Nustep L5 12 min. B UE/LE.  (completed 0.67 miles). Pulse ox. 94. Pulse 81 bpm   Nautilus (in standing): 50# lat. Pull downs/ 40# tricep extension/ 50# scap. Retraction 20x each.    Seated marches. 2x10. Cued on posture and keeping R LE toes facing forward Seated LAQ     Walking in //-bars focusing on standing tall and heel strike with each step. 2 laps. Pt. Noted slight increase in B knee pain     Walking in hallway to car (1 laps) with cuing for consistent step pattern/ upright posture/ heel strike/ arm swing. Pt. Is able to manage terrain going to the car well.        PT Short Term Goals - 12/24/21 0810       PT SHORT TERM GOAL #1   Title Pt will be indep with HEP to improve strength and balance to reduce risk of falls.    Baseline 11/23: initiated    Time 4    Period Weeks     Status Achieved               PT Long Term Goals - 12/23/21 1510       PT LONG TERM GOAL #1   Title Pt will improve FOTO score to target to display improvement in functional mobility.    Baseline 11/23: 29/54    Time 8    Period Weeks    Status New    Target Date 02/17/22      PT LONG TERM GOAL #2   Title Pt wil improve TUG score to < 12 sec with no AD to display significant imporvement in reduced risk of falls    Baseline 11/23: 26 sec (did not sit down immediately leading to higher time than he would've scored) 1/19: 14.44 sec    Time 8    Period Weeks    Status Partially Met    Target Date 02/17/22      PT LONG TERM GOAL #3   Title Pt will improve DGI to >19/24 to indicate decreased risk of falls with household and community walking tasks    Baseline 11/23: 11/24 1/19:12/24    Time 8    Period Weeks    Status Partially Met    Target Date 02/17/22      PT LONG TERM GOAL #4   Title Pt will improve 30 sec STS >12 reps to display clinically significnat improvement in LE strength/endurance for functional mobility.    Baseline 11/23: 7 reps 1/19: 10.5 reps    Time 8    Period Weeks    Status Partially Met    Target Date 02/17/22                Plan - 12/27/21 1218     Clinical Impression Statement Pt. is continuing to work on ambulation as well as resistance training to maintain current health status.  BP: 142/65 prior to Nustep.  Pt. shows high motivation to do the exercises but is slightly limited by fatigue/ knee pain. Pt. is constantly monitored via O2 sats and they remain >95%. Pt. will continue to benefit from ambulation practice and cueing for proper form.  No LOB during tx. sessoin but several seated rest breaks.    Personal Factors and Comorbidities Comorbidity 2    Comorbidities Hypertension, Obesity    Examination-Activity Limitations Bed Mobility;Reach Overhead;Squat;Lift;Dressing;Hygiene/Grooming    Examination-Participation Restrictions Community  Activity;Yard Work    Merchant navy officer Evolving/Moderate complexity  Clinical Decision Making Moderate    Rehab Potential Fair    PT Frequency 2x / week    PT Duration 8 weeks    PT Treatment/Interventions ADLs/Self Care Home Management;Gait training;Stair training;Functional mobility training;Therapeutic activities;Therapeutic exercise;Balance training;Neuromuscular re-education;Manual techniques;Patient/family education;Electrical Stimulation;Moist Heat;Cryotherapy    PT Next Visit Plan Progress LE strengthening.    PT Home Exercise Plan Shoulder pulleys in flex, scaption, abd, pendelums, scap retractions with resistance (RTB), D2 flexion pattern in sitting no resistance.    Consulted and Agree with Plan of Care Patient             Patient will benefit from skilled therapeutic intervention in order to improve the following deficits and impairments:  Abnormal gait, Decreased activity tolerance, Decreased balance, Decreased coordination, Decreased mobility, Decreased endurance, Decreased range of motion, Decreased strength, Difficulty walking, Dizziness, Impaired UE functional use, Pain, Hypomobility  Visit Diagnosis: Difficulty in walking, not elsewhere classified  Muscle weakness (generalized)  Abnormality of gait and mobility     Problem List Patient Active Problem List   Diagnosis Date Noted   Hemorrhoid 10/18/2021   Obesity, Class III, BMI 40-49.9 (morbid obesity) ([place])    Atrial fibrillation with RVR ([place]) 07/14/2020   Persistent atrial fibrillation ([place]) 07/14/2020   Hypertension    Hemiparesis affecting right side as late effect of cerebrovascular accident ([place]) 08/13/2019   History of kidney stones 05/02/2018   ED (erectile dysfunction) 05/02/2018   Hyperglycemia 01/30/2017   Arthritis of knee, degenerative 01/30/2017   BPH with obstruction/lower urinary tract symptoms 02/03/2016   Gross hematuria 02/03/2016   Dyslipidemia 08/04/2015    Diverticulitis of large intestine without perforation or abscess without bleeding 06/30/2015   Edema 06/30/2015   OSA on CPAP 06/30/2015   Pura Spice, PT, DPT # [phone] 12/27/2021, 12:34 PM  [facility] [address] [place], [place], [address] Phone: [phone]   Fax:  [phone]  Name: MAXAMILLIAN TIENDA MRN: [id number] Date of Birth: [birth year]

## 2021-12-28 NOTE — Progress Notes (Signed)
Electrophysiology Office Note:    Date:  12/29/2021   ID:  JAVAUGHN GERREN, DOB [birth year], MRN [id number]  PCP:  Steele Sizer, MD  [facility] Cardiologist:  Kate Sable, MD  [facility] Electrophysiologist:  Vickie Epley, MD   Referring MD: Kate Sable, MD   Chief Complaint: AF  History of Present Illness:    Tom Underwood is a 71 y.o. male who presents for an evaluation of AF at the request of Dr Garen Lah. Their medical history includes persistent atrial fibrillation, HTN, HLC, CVA in 2020, OSA on CPAP. He last saw Dr Garen Lah on 11/15/2021. He takes xarelto for stroke ppx.  He is referred to discuss AF management strategies. He is a wheelchair for mobility because of a history of a stroke.  Today he tells me he is overall doing well.     Past Medical History:  Diagnosis Date   Allergy    Decreased libido    Diverticulitis    GERD (gastroesophageal reflux disease)    HLD (hyperlipidemia)    Hydronephrosis with renal and ureteral calculus obstruction    Hydronephrosis with renal and ureteral calculus obstruction    Hypertension    Hypogonadism in male    Rhinitis, allergic    Sleep apnea    Stroke [facility])    Ureteral stone     Past Surgical History:  Procedure Laterality Date   CARDIOVERSION N/A 12/08/2021   Procedure: CARDIOVERSION;  Surgeon: Nelva Bush, MD;  Location: [facility] ORS;  Service: Cardiovascular;  Laterality: N/A;   EXTERNAL EAR SURGERY     FINGER SURGERY     KNEE SURGERY     left eye      Current Medications: Current Meds  Medication Sig   acetaminophen (TYLENOL) 650 MG CR tablet Take 1,300 mg by mouth every evening.   amitriptyline (ELAVIL) 25 MG tablet TAKE 1 TABLET BY MOUTH AT BEDTIME AS NEEDED FOR SLEEP   buPROPion (WELLBUTRIN XL) 150 MG 24 hr tablet Take 1 tablet (150 mg total) by mouth every morning.   busPIRone (BUSPAR) 10 MG tablet Take 1 tablet (10 mg total) by mouth 2 (two) times daily.   cetirizine  (ZYRTEC) 10 MG tablet Take 10 mg by mouth in the morning.    Cholecalciferol (VITAMIN D) 125 MCG (5000 UT) CAPS Take 5,000 Units by mouth daily.   furosemide (LASIX) 20 MG tablet Take 1 tablet (20 mg total) by mouth as needed. For lower extremity swelling.   GLUCOSAMINE-CHONDROITIN PO Take 2 tablets by mouth in the morning and at bedtime.   hydrocortisone (ANUSOL-HC) 25 MG suppository Place 1 suppository (25 mg total) rectally every 12 (twelve) hours. (Patient taking differently: Place 25 mg rectally 2 (two) times daily as needed for hemorrhoids.)   metoprolol tartrate (LOPRESSOR) 50 MG tablet Take 0.5 tablets (25 mg total) by mouth 2 (two) times daily.   Multiple Vitamin (MULTIVITAMIN) tablet Take 1 tablet by mouth daily.   omega-3 acid ethyl esters (LOVAZA) 1 g capsule Take 2 capsules (2 g total) by mouth 2 (two) times daily.   pantoprazole (PROTONIX) 20 MG tablet Take 1 tablet by mouth once daily   rivaroxaban (XARELTO) 20 MG TABS tablet Take 1 tablet (20 mg total) by mouth daily with supper.   rosuvastatin (CRESTOR) 20 MG tablet Take 1 tablet (20 mg total) by mouth daily.   Saccharomyces boulardii (PROBIOTIC) 250 MG CAPS Take 1 capsule by mouth daily.   tamsulosin (FLOMAX) 0.4 MG CAPS capsule Take  1 capsule (0.4 mg total) by mouth daily.   valsartan (DIOVAN) 80 MG tablet Take 1 tablet (80 mg total) by mouth daily.   VISINE DRY EYE RELIEF 1 % SOLN Place 1 drop into both eyes daily as needed (dry eyes).     Allergies:   Penicillins and Propofol   Social History   Socioeconomic History   Marital status: Married    Spouse name: Butch Penny    Number of children: 2   Years of education: Not on file   Highest education level: Not on file  Occupational History   Occupation: retired   Tobacco Use   Smoking status: Former    Types: Pipe    Start date: 02/20/1973    Quit date: 02/20/1974    Years since quitting: 47.8   Smokeless tobacco: Never  Vaping Use   Vaping Use: Never used  Substance  and Sexual Activity   Alcohol use: No    Alcohol/week: 0.0 standard drinks   Drug use: No   Sexual activity: Yes    Partners: Female  Other Topics Concern   Not on file  Social History Narrative   Not on file   Social Determinants of Health   Financial Resource Strain: Low Risk    Difficulty of Paying Living Expenses: Not hard at all  Food Insecurity: No Food Insecurity   Worried About Charity fundraiser in the Last Year: Never true   [place] in the Last Year: Never true  Transportation Needs: No Transportation Needs   Lack of Transportation (Medical): No   Lack of Transportation (Non-Medical): No  Physical Activity: Insufficiently Active   Days of Exercise per Week: 4 days   Minutes of Exercise per Session: 10 min  Stress: Stress Concern Present   Feeling of Stress : To some extent  Social Connections: Engineer, building services of Communication with Friends and Family: More than three times a week   Frequency of Social Gatherings with Friends and Family: Twice a week   Attends Religious Services: More than 4 times per year   Active Member of [employer] or Organizations: Yes   Attends Music therapist: More than 4 times per year   Marital Status: Married     Family History: The patient's family history includes Asthma in his father and mother; Dementia in his mother; Heart attack in his mother; Heart disease in his father and mother; Stroke in his father.  ROS:   Please see the history of present illness.    All other systems reviewed and are negative.  EKGs/Labs/Other Studies Reviewed:    The following studies were reviewed today:  07/15/2020 Echo LV normal, 55-60% RV normal No significant valvular abnormalities Atria are normal in size   EKG:  The ekg ordered today demonstrates sinus rhythm.  Ventricular rate 57 bpm.  QTc 390 ms.   Recent Labs: 05/28/2021: TSH 2.530 08/31/2021: ALT 26 11/15/2021: BUN 15; Creatinine, Ser 1.03; Hemoglobin  17.8; Platelets 240; Potassium 4.2; Sodium 142  Recent Lipid Panel    Component Value Date/Time   CHOL 120 05/28/2021 1336   TRIG 79 05/28/2021 1336   HDL 51 05/28/2021 1336   CHOLHDL 2.4 05/28/2021 1336   LDLCALC 53 05/28/2021 1336    Physical Exam:    VS:  BP 128/74 (BP Location: Right Arm, Patient Position: Sitting, Cuff Size: Large)    Pulse (!) 57    Ht 5\' 10"  (1.778 m)    SpO2 96%  BMI 41.04 kg/m     Wt Readings from Last 3 Encounters:  12/08/21 286 lb (129.7 kg)  11/15/21 288 lb (130.6 kg)  11/02/21 288 lb (130.6 kg)     GEN:  Well nourished, well developed in no acute distress.  Obese.  In wheelchair. HEENT: Normal NECK: No JVD; No carotid bruits LYMPHATICS: No lymphadenopathy CARDIAC: RRR, no murmurs, rubs, gallops RESPIRATORY:  Clear to auscultation without rales, wheezing or rhonchi  ABDOMEN: Soft, non-tender, non-distended MUSCULOSKELETAL: Trivial to 1+ bilateral pitting lower extremity edema; No deformity  SKIN: Warm and dry NEUROLOGIC:  Alert and oriented x 3 PSYCHIATRIC:  Normal affect       ASSESSMENT:    1. Persistent atrial fibrillation ([place])   2. Essential hypertension    PLAN:    In order of problems listed above:  #Persistent atrial fibrillation Asymptomatic.  On Xarelto for stroke prophylaxis.  He is post recent cardioversion.  He cannot fully tell that he feels better in normal rhythm.  For now, recommend continuing metoprolol and Xarelto for CHA2DS2-VASc of 4.  He is not a good ablation candidate because of his BMI and history of stroke.  During the appointment I did discuss antiarrhythmic drugs and catheter ablation.  #Hypertension Controlled.  Continue current regimen.  He can follow-up with me on an as-needed basis.      Total time spent with patient today 49 minutes. This includes reviewing records, evaluating the patient and coordinating care.  Medication Adjustments/Labs and Tests Ordered: Current medicines are reviewed at  length with the patient today.  Concerns regarding medicines are outlined above.  Orders Placed This Encounter  Procedures   EKG 12-Lead   No orders of the defined types were placed in this encounter.    Signed, Hilton Cork. Quentin Ore, MD, [facility], [facility] 12/29/2021 8:59 PM    Electrophysiology [facility]

## 2021-12-29 ENCOUNTER — Ambulatory Visit: Payer: Medicare Other | Admitting: Psychologist

## 2021-12-29 ENCOUNTER — Ambulatory Visit: Payer: Medicare Other | Admitting: Physical Therapy

## 2021-12-29 ENCOUNTER — Ambulatory Visit (INDEPENDENT_AMBULATORY_CARE_PROVIDER_SITE_OTHER): Payer: Medicare Other | Admitting: Cardiology

## 2021-12-29 ENCOUNTER — Other Ambulatory Visit: Payer: Self-pay

## 2021-12-29 ENCOUNTER — Encounter: Payer: Self-pay | Admitting: Cardiology

## 2021-12-29 VITALS — BP 128/74 | HR 57 | Ht 70.0 in

## 2021-12-29 DIAGNOSIS — I10 Essential (primary) hypertension: Secondary | ICD-10-CM

## 2021-12-29 DIAGNOSIS — I4819 Other persistent atrial fibrillation: Secondary | ICD-10-CM

## 2021-12-29 NOTE — Patient Instructions (Addendum)
Medications: Your physician recommends that you continue on your current medications as directed. Please refer to the Current Medication list given to you today. *If you need a refill on your cardiac medications before your next appointment, please call your pharmacy*  Lab Work: None. If you have labs (blood work) drawn today and your tests are completely normal, you will receive your results only by: MyChart Message (if you have MyChart) OR A paper copy in the mail If you have any lab test that is abnormal or we need to change your treatment, we will call you to review the results.  Testing/Procedures: None.  Follow-Up: At [facility], you and your health needs are our priority.  As part of our continuing mission to provide you with exceptional heart care, we have created designated Provider Care Teams.  These Care Teams include your primary Cardiologist (physician) and Advanced Practice Providers (APPs -  Physician Assistants and Nurse Practitioners) who all work together to provide you with the care you need, when you need it.  Your physician wants you to follow-up in: Follow up as needed with Dr. Lalla Brothers.   We recommend signing up for the patient portal called "MyChart".  Sign up information is provided on this After Visit Summary.  MyChart is used to connect with patients for Virtual Visits (Telemedicine).  Patients are able to view lab/test results, encounter notes, upcoming appointments, etc.  Non-urgent messages can be sent to your provider as well.   To learn more about what you can do with MyChart, go to [email or web address].    Any Other Special Instructions Will Be Listed Below (If Applicable).

## 2021-12-30 ENCOUNTER — Encounter: Payer: Medicare Other | Admitting: Physical Therapy

## 2021-12-30 DIAGNOSIS — M25462 Effusion, left knee: Secondary | ICD-10-CM | POA: Diagnosis not present

## 2021-12-30 DIAGNOSIS — G4733 Obstructive sleep apnea (adult) (pediatric): Secondary | ICD-10-CM | POA: Diagnosis not present

## 2021-12-30 DIAGNOSIS — G8929 Other chronic pain: Secondary | ICD-10-CM | POA: Diagnosis not present

## 2021-12-30 DIAGNOSIS — Z8673 Personal history of transient ischemic attack (TIA), and cerebral infarction without residual deficits: Secondary | ICD-10-CM | POA: Diagnosis not present

## 2021-12-30 DIAGNOSIS — M25562 Pain in left knee: Secondary | ICD-10-CM | POA: Diagnosis not present

## 2021-12-30 DIAGNOSIS — Z9989 Dependence on other enabling machines and devices: Secondary | ICD-10-CM | POA: Diagnosis not present

## 2021-12-30 DIAGNOSIS — I69351 Hemiplegia and hemiparesis following cerebral infarction affecting right dominant side: Secondary | ICD-10-CM | POA: Diagnosis not present

## 2021-12-30 DIAGNOSIS — R251 Tremor, unspecified: Secondary | ICD-10-CM | POA: Diagnosis not present

## 2022-01-06 ENCOUNTER — Other Ambulatory Visit: Payer: Self-pay

## 2022-01-06 ENCOUNTER — Encounter: Payer: Self-pay | Admitting: Physical Therapy

## 2022-01-06 ENCOUNTER — Ambulatory Visit: Payer: Medicare Other | Attending: Neurology | Admitting: Physical Therapy

## 2022-01-06 VITALS — BP 124/59 | HR 65

## 2022-01-06 DIAGNOSIS — R269 Unspecified abnormalities of gait and mobility: Secondary | ICD-10-CM | POA: Insufficient documentation

## 2022-01-06 DIAGNOSIS — R2681 Unsteadiness on feet: Secondary | ICD-10-CM | POA: Insufficient documentation

## 2022-01-06 DIAGNOSIS — R262 Difficulty in walking, not elsewhere classified: Secondary | ICD-10-CM | POA: Insufficient documentation

## 2022-01-06 DIAGNOSIS — M6281 Muscle weakness (generalized): Secondary | ICD-10-CM | POA: Diagnosis not present

## 2022-01-06 NOTE — Therapy (Addendum)
[facility] [address] [place], [place], [address] Phone: [phone]   Fax:  [phone]  Physical Therapy Treatment  Patient Details  Name: Tom Underwood MRN: [id number] Date of Birth: [birth year] Referring Provider (PT): Steele Sizer, MD   Encounter Date: 01/06/2022   PT End of Session - 01/06/22 1300     Visit Number 9    Number of Visits 25    Date for PT Re-Evaluation 02/17/22    Authorization Type Medicare A&B; generic commercial (visit limits based on medical necessity)    Authorization - Visit Number 9    Authorization - Number of Visits 10    PT Start Time 5361    PT Stop Time 1405    PT Time Calculation (min) 80 min    Equipment Utilized During Treatment Gait belt    Activity Tolerance Patient limited by fatigue;Patient limited by pain    Behavior During Therapy WFL for tasks assessed/performed             Past Medical History:  Diagnosis Date   Allergy    Decreased libido    Diverticulitis    GERD (gastroesophageal reflux disease)    HLD (hyperlipidemia)    Hydronephrosis with renal and ureteral calculus obstruction    Hydronephrosis with renal and ureteral calculus obstruction    Hypertension    Hypogonadism in male    Rhinitis, allergic    Sleep apnea    Stroke [facility])    Ureteral stone     Past Surgical History:  Procedure Laterality Date   CARDIOVERSION N/A 12/08/2021   Procedure: CARDIOVERSION;  Surgeon: Nelva Bush, MD;  Location: [facility] ORS;  Service: Cardiovascular;  Laterality: N/A;   EXTERNAL EAR SURGERY     FINGER SURGERY     KNEE SURGERY     left eye      Vitals:   01/06/22 1256  BP: (!) 124/59  Pulse: 65  SpO2: 95%     Subjective Assessment - 01/06/22 1259     Subjective Pt. reports an increase in B knee pain this week from more activity than usual. Pt. has notable R leg swelling when walking into clinic.    Pertinent History Tom Underwood is well known to PT clinic.  Presenting to eval today for further LE strengthening, gait, and balance deficits. Reporting since stroke, MD told him he lost ~60% of his LE strength. Current baseline is household and short community stances with SPC. Does endorse difficulty in safe ability to get in/out of shower, donning/doffing socks indep, long distance ambulation, endorses wishing to improve his balance where he can use two hands for ADL's and not feel like he needs to rely on SUE on an object for safety. Pt reports ability to safely perform STS but wishes to improve LE endurance. States he is safe with his 5 stairs to enter/exit home with use of handrail and SPC. Denies any falls in the last 6 months. These are all goals pt wishes to accomplish and improve upon.    Limitations Walking;Standing;Lifting;House hold activities    How long can you sit comfortably? unlimited    How long can you stand comfortably? 15-20 minutes    How long can you walk comfortably? 15 minutes    Patient Stated Goals Improve balance, LE strength/endurance.    Currently in Pain? Yes    Pain Score 4     Pain Location Back    Pain Descriptors / Indicators Aching  Pain Score 6    Pain Location Knee    Pain Orientation Left             Ther.ex.:  Nustep L5 12 min. B UE/LE.  (completed 0.67 miles). Pulse ox. 94 Pulse 105 bpm   Nautilus (in standing): 70# lat. Pull downs/ 40# tricep extension 20x each.  Standing BlueTB ER x20    Step ups to 6' step in //-bars x20 each   Seated LAQ x20    Walking in //-bars focusing on standing tall and heel strike with each step. 2 laps. Pt. Noted slight increase in B knee pain    Lateral Walking in //-bars with minimal CGA 3x   Walking in [employer], SPT to car (1 laps) with cuing for consistent step pattern/hip flexion/ heel strike  Circumfrential measurements-  Joint line R/L: 51.5 cm/47.5 cm Distal quad R/L 60 cm/54 cm Head of fibula R/L 49 cm/44 cm    PT Short Term Goals -  12/24/21 0810       PT SHORT TERM GOAL #1   Title Pt will be indep with HEP to improve strength and balance to reduce risk of falls.    Baseline 11/23: initiated    Time 4    Period Weeks    Status Achieved               PT Long Term Goals - 12/23/21 1510       PT LONG TERM GOAL #1   Title Pt will improve FOTO score to target to display improvement in functional mobility.    Baseline 11/23: 29/54    Time 8    Period Weeks    Status New    Target Date 02/17/22      PT LONG TERM GOAL #2   Title Pt wil improve TUG score to < 12 sec with no AD to display significant imporvement in reduced risk of falls    Baseline 11/23: 26 sec (did not sit down immediately leading to higher time than he would've scored) 1/19: 14.44 sec    Time 8    Period Weeks    Status Partially Met    Target Date 02/17/22      PT LONG TERM GOAL #3   Title Pt will improve DGI to >19/24 to indicate decreased risk of falls with household and community walking tasks    Baseline 11/23: 11/24 1/19:12/24    Time 8    Period Weeks    Status Partially Met    Target Date 02/17/22      PT LONG TERM GOAL #4   Title Pt will improve 30 sec STS >12 reps to display clinically significnat improvement in LE strength/endurance for functional mobility.    Baseline 11/23: 7 reps 1/19: 10.5 reps    Time 8    Period Weeks    Status Partially Met    Target Date 02/17/22                Plan - 01/06/22 1343     Clinical Impression Statement Pt. experiences swelling in R LE as seen with circumferential measurements. Pt. is experiencing an increase in L LE pain. Pt. is able to perform all exercises but does have an increase in fatigue throughout the session and requires longer breaks. Pt. is constantly monitored via O2 sats and they remain >95% for safety. Pt. will continue to benefit from strength and gait exercises as well as a safe environment for him to  exercises. Pt. responds well with consistant cueing during  ambulation to increase L hip flexion and exaggerate heel strike to have a more steady gait pattern.    Personal Factors and Comorbidities Comorbidity 2    Comorbidities Hypertension, Obesity    Examination-Activity Limitations Bed Mobility;Reach Overhead;Squat;Lift;Dressing;Hygiene/Grooming    Examination-Participation Restrictions Community Activity;Yard Work    Merchant [employer] officer Evolving/Moderate complexity    Clinical Decision Making Moderate    Rehab Potential Fair    PT Frequency 2x / week    PT Duration 8 weeks    PT Treatment/Interventions ADLs/Self Care Home Management;Gait training;Stair training;Functional mobility training;Therapeutic activities;Therapeutic exercise;Balance training;Neuromuscular re-education;Manual techniques;Patient/family education;Electrical Stimulation;Moist Heat;Cryotherapy    PT Next Visit Plan Progress LE strengthening.  10th visit progress note    PT Home Exercise Plan Shoulder pulleys in flex, scaption, abd, pendelums, scap retractions with resistance (RTB), D2 flexion pattern in sitting no resistance.    Consulted and Agree with Plan of Care Patient             Patient will benefit from skilled therapeutic intervention in order to improve the following deficits and impairments:  Abnormal gait, Decreased activity tolerance, Decreased balance, Decreased coordination, Decreased mobility, Decreased endurance, Decreased range of motion, Decreased strength, Difficulty walking, Dizziness, Impaired UE functional use, Pain, Hypomobility  Visit Diagnosis: Difficulty in walking, not elsewhere classified  Muscle weakness (generalized)  Abnormality of gait and mobility     Problem List Patient Active Problem List   Diagnosis Date Noted   Hemorrhoid 10/18/2021   Obesity, Class III, BMI 40-49.9 (morbid obesity) ([place])    Atrial fibrillation with RVR ([place]) 07/14/2020   Persistent atrial fibrillation ([place]) 07/14/2020   Hypertension     Hemiparesis affecting right side as late effect of cerebrovascular accident ([place]) 08/13/2019   History of kidney stones 05/02/2018   ED (erectile dysfunction) 05/02/2018   Hyperglycemia 01/30/2017   Arthritis of knee, degenerative 01/30/2017   BPH with obstruction/lower urinary tract symptoms 02/03/2016   Gross hematuria 02/03/2016   Dyslipidemia 08/04/2015   Diverticulitis of large intestine without perforation or abscess without bleeding 06/30/2015   Edema 06/30/2015   OSA on CPAP 06/30/2015   Pura Spice, PT, DPT # [id number] Cleopatra Cedar, SPT 01/06/2022, 3:33 PM  [facility] [address] [place], [place], [address] Phone: [phone]   Fax:  [phone]  Name: Tom Underwood MRN: [id number] Date of Birth: October 17, 1951

## 2022-01-11 ENCOUNTER — Ambulatory Visit (INDEPENDENT_AMBULATORY_CARE_PROVIDER_SITE_OTHER): Payer: Medicare Other | Admitting: Psychologist

## 2022-01-11 DIAGNOSIS — F320 Major depressive disorder, single episode, mild: Secondary | ICD-10-CM

## 2022-01-11 DIAGNOSIS — Z634 Disappearance and death of family member: Secondary | ICD-10-CM

## 2022-01-11 NOTE — Progress Notes (Addendum)
[facility] Counselor/Therapist Progress Note  Patient ID: Tom Underwood, MRN: [id number],    Date: 01/11/2022  Time Spent: 1:05 pm to 1:43 pm; total time: 38 minutes   This session was held via webex video teletherapy due to the coronavirus risk at this time. The patient consented to webex video teletherapy and was located at his home during this session. He is aware it is the responsibility of the patient to secure confidentiality on his end of the session. The provider was in a private home office for the duration of this session. Limits of confidentiality were discussed with the patient.   Treatment Type: Individual Therapy  Reported Symptoms: Grief    Mental Status Exam: Appearance:  NA      Behavior: Appropriate  Motor: NA  Speech/Language:  Slow  Affect: Appropriate  Mood: normal  Thought process: normal  Thought content:   WNL  Sensory/Perceptual disturbances:   WNL  Orientation: oriented to person, place, and time/date  Attention: Fair  Concentration: Fair  Memory: WNL  Fund of knowledge:  Good  Insight:   Fair  Judgment:  Good  Impulse Control: Good   Risk Assessment: Danger to Self:  No Self-injurious Behavior: No Danger to Others: No Duty to Warn:no Physical Aggression / Violence:No  Access to Firearms a concern: No  Gang Involvement:No   Subjective: Beginning the session, the patient described himself as doing well while reflecting on events since the last session. Patient acknowledged that he has not completed his homework assignments. Patient identified obstacles to completing homework and identified ways to overcome those obstacles. He was agreeable to homework and following up. He denied suicidal and homicidal ideation.    Interventions:  Worked on developing a therapeutic relationship with the patient using active listening and reflective statements. Provided emotional support using empathy and validation. Used summary statements.  Reviewed events since the last session. Praised patient for doing slightly better and explored what has helped. Processed whether patient had completed his homework. Used MI to roll with the resistance. Identified barriers and processed ways to overcome those barriers. Assisted in problem solving. Identified dates for completing assignments. Provided psychoeducation about possible audio option for the book Tear Soup. Assessed for suicidal and homicidal ideation.   Homework: Write letter to mother by February 21st, complete letter to baseball play March 3rd, listen to [employer] and watch Felicity Pellegrini Talk  Next Session: Review homework and emotional support.   Diagnosis: F32.0 major depressive affective disorder, single episode, mild and Z63.4 uncomplicated bereavement.   Plan:   Client Abilities: Friendly and easy to develop rapport  Client Preferences:  ACT and CBT  Client statement of Needs: Strategies to move forward and process emotions  Treatment Level: Outpatient   Goals Alleviate depressive symptoms Recognize, accept, and cope with depressive feelings Develop healthy thinking patterns Develop healthy interpersonal relationships  Objectives target date for all objectives is 12/07/2022 Cooperate with a medication evaluation by a physician Verbalize an accurate understanding of depression Verbalize an understanding of the treatment Identify and replace thoughts that support depression Learn and implement behavioral strategies Verbalize an understanding and resolution of current interpersonal problems Learn and implement problem solving and decision making skills Learn and implement conflict resolution skills to resolve interpersonal problems Verbalize an understanding of healthy and unhealthy emotions verbalize insight into how past relationships may be influence current experiences with depression Use mindfulness and acceptance strategies and increase value based behavior  Increase  hopeful statements about the future.  Interventions  Consistent with treatment model, discuss how change in cognitive, behavioral, and interpersonal can help client alleviate depression CBT Behavioral activation help the client explore the relationship, nature of the dispute,  Help the client develop new interpersonal skills and relationships Conduct Problem so living therapy Teach conflict resolution skills Use a process-experiential approach Conduct TLDP Conduct ACT Evaluate need for psychotropic medication Monitor adherence to medication   Goals Begin a healthy grieving process Objectives target date for all objectives is 12/07/2022 Tell in detail the story of the current loss that is triggering symptoms Read books on the topic of grief Watch videos on the theme of grief Begin verbalizing feelings associated with the loss Attend a grief support group express thoughts and feelings about the deceased Identify and voice positives about the deceased implement acts of spiritual faith  Interventions create a safe environment and actively build trust use empathy, compassion, and support ask the patient to write a letter to the lost person conduct empty chair ask the patient to discuss and list the positives and negative aspects of the person encourage patient to rely upon his/her spiritual faith  ask client to read books on grief ask patient to watch videos about grief assist patient in identifying emotions  ask patient to attend support group   The patient and clinician reviewed the treatment plan on 12/21/2021. The patient approved of the treatment plan.    Hilbert Corrigan, PsyD                  Hilbert Corrigan, PsyD

## 2022-01-13 ENCOUNTER — Ambulatory Visit: Payer: Medicare Other | Admitting: Physical Therapy

## 2022-01-13 DIAGNOSIS — M25562 Pain in left knee: Secondary | ICD-10-CM | POA: Diagnosis not present

## 2022-01-13 DIAGNOSIS — M25561 Pain in right knee: Secondary | ICD-10-CM | POA: Diagnosis not present

## 2022-01-13 DIAGNOSIS — M170 Bilateral primary osteoarthritis of knee: Secondary | ICD-10-CM | POA: Diagnosis not present

## 2022-01-13 DIAGNOSIS — G8929 Other chronic pain: Secondary | ICD-10-CM | POA: Diagnosis not present

## 2022-01-17 ENCOUNTER — Encounter: Payer: Self-pay | Admitting: Physical Therapy

## 2022-01-17 ENCOUNTER — Other Ambulatory Visit: Payer: Self-pay

## 2022-01-17 ENCOUNTER — Ambulatory Visit: Payer: Medicare Other | Admitting: Physical Therapy

## 2022-01-17 VITALS — BP 142/65 | HR 46

## 2022-01-17 DIAGNOSIS — R262 Difficulty in walking, not elsewhere classified: Secondary | ICD-10-CM

## 2022-01-17 DIAGNOSIS — R269 Unspecified abnormalities of gait and mobility: Secondary | ICD-10-CM | POA: Diagnosis not present

## 2022-01-17 DIAGNOSIS — R2681 Unsteadiness on feet: Secondary | ICD-10-CM | POA: Diagnosis not present

## 2022-01-17 DIAGNOSIS — M6281 Muscle weakness (generalized): Secondary | ICD-10-CM

## 2022-01-17 NOTE — Therapy (Addendum)
[facility] [address] [place], [place], [address] Phone: [phone]   Fax:  [phone]  Physical Therapy Treatment Physical Therapy Progress Note   Dates of reporting period  10/27/22  to  01/17/2022  Patient Details  Name: Tom Underwood MRN: [id number] Date of Birth: 1951-05-10 Referring Provider (PT): Steele Sizer, MD   Encounter Date: 01/17/2022   PT End of Session - 01/18/22 0951     Visit Number 10    Number of Visits 25    Date for PT Re-Evaluation 02/17/22    Authorization Type Medicare A&B; generic commercial (visit limits based on medical necessity)    Authorization - Visit Number 10    Authorization - Number of Visits 10    PT Start Time 1340    PT Stop Time 1435    PT Time Calculation (min) 55 min    Equipment Utilized During Treatment Gait belt    Activity Tolerance Patient limited by fatigue;Patient limited by pain    Behavior During Therapy WFL for tasks assessed/performed             Past Medical History:  Diagnosis Date   Allergy    Decreased libido    Diverticulitis    GERD (gastroesophageal reflux disease)    HLD (hyperlipidemia)    Hydronephrosis with renal and ureteral calculus obstruction    Hydronephrosis with renal and ureteral calculus obstruction    Hypertension    Hypogonadism in male    Rhinitis, allergic    Sleep apnea    Stroke [facility])    Ureteral stone     Past Surgical History:  Procedure Laterality Date   CARDIOVERSION N/A 12/08/2021   Procedure: CARDIOVERSION;  Surgeon: Nelva Bush, MD;  Location: [facility] ORS;  Service: Cardiovascular;  Laterality: N/A;   EXTERNAL EAR SURGERY     FINGER SURGERY     KNEE SURGERY     left eye      Vitals:   01/17/22 1352  BP: (!) 142/65  Pulse: (!) 46  SpO2: 95%     Subjective Assessment - 01/17/22 1352     Subjective Pt. is having no increase in B knee pain when arriving to the clinic. Pt. has MD appointments for L knee  swelling and states they were concerned with his BP. Pt. subjectively says he is feeling good at the beginning of the session.  B knee injections last Thursday.    Pertinent History Seichi Kaufhold is well known to PT clinic. Presenting to eval today for further LE strengthening, gait, and balance deficits. Reporting since stroke, MD told him he lost ~60% of his LE strength. Current baseline is household and short community stances with SPC. Does endorse difficulty in safe ability to get in/out of shower, donning/doffing socks indep, long distance ambulation, endorses wishing to improve his balance where he can use two hands for ADL's and not feel like he needs to rely on SUE on an object for safety. Pt reports ability to safely perform STS but wishes to improve LE endurance. States he is safe with his 5 stairs to enter/exit home with use of handrail and SPC. Denies any falls in the last 6 months. These are all goals pt wishes to accomplish and improve upon.    Limitations Walking;Standing;Lifting;House hold activities    How long can you sit comfortably? unlimited    How long can you stand comfortably? 15-20 minutes    How long can you walk comfortably? 15  minutes    Patient Stated Goals Improve balance, LE strength/endurance.    Currently in Pain? Yes    Pain Score 1     Pain Location Knee    Pain Orientation Right;Left    Pain Descriptors / Indicators Aching    Pain Type Chronic pain    Pain Score 3    Pain Location Hip    Pain Type Chronic pain             Ther.ex.:   Nustep L5 12 min. B UE/LE.   Nautilus (in standing): 70# lat. Pull downs/ 40# tricep extension 20x each.   Standing BlueTB ER x20     Seated LAQ x20    Walking in //-bars focusing on standing tall and heel strike with each step. 2 laps.     Lateral/ backward Walking in //-bars with minimal CGA 3x   Walking in hallway to car (1 laps) with cuing for consistent step pattern/hip flexion/ heel strike     PT Short  Term Goals - 12/24/21 0810       PT SHORT TERM GOAL #1   Title Pt will be indep with HEP to improve strength and balance to reduce risk of falls.    Baseline 11/23: initiated    Time 4    Period Weeks    Status Achieved               PT Long Term Goals - 01/18/22 1025       PT LONG TERM GOAL #1   Title Pt will improve FOTO score to target to display improvement in functional mobility.    Baseline 11/23: 29/54    Time 8    Period Weeks    Status Partially Met    Target Date 02/17/22      PT LONG TERM GOAL #2   Title Pt wil improve TUG score to < 12 sec with no AD to display significant imporvement in reduced risk of falls    Baseline 11/23: 26 sec (did not sit down immediately leading to higher time than he would've scored) 1/19: 14.44 sec    Time 8    Period Weeks    Status Partially Met    Target Date 02/17/22      PT LONG TERM GOAL #3   Title Pt will improve DGI to >19/24 to indicate decreased risk of falls with household and community walking tasks    Baseline 11/23: 11/24 1/19:12/24    Time 8    Period Weeks    Status Partially Met    Target Date 02/17/22      PT LONG TERM GOAL #4   Title Pt will improve 30 sec STS >12 reps to display clinically significnat improvement in LE strength/endurance for functional mobility.    Baseline 11/23: 7 reps 1/19: 10.5 reps    Time 8    Period Weeks    Status Partially Met    Target Date 02/17/22                   Plan - 01/18/22 0953     Clinical Impression Statement Pt. notes a decrease in LE swelling after this past week. Pt received corticosteroid shot in B knees but feels there is only minor relief. Pt. greatly benefits from CGA during all ambulation activities and cueing to remain controlled in his movement patterns.  Pt. is constantly monitored via O2 sats and they remain >95% for safety. Pt. will continue benefit  from strength and gait activities with CGA from a PT. Pt. is hopeful the knee injections will  help and is looking at potential Synvisc injections for pain relief.    Personal Factors and Comorbidities Comorbidity 2    Comorbidities Hypertension, Obesity    Examination-Activity Limitations Bed Mobility;Reach Overhead;Squat;Lift;Dressing;Hygiene/Grooming    Examination-Participation Restrictions Community Activity;Yard Work    Merchant navy officer Evolving/Moderate complexity    Clinical Decision Making Moderate    Rehab Potential Fair    PT Frequency 2x / week    PT Duration 8 weeks    PT Treatment/Interventions ADLs/Self Care Home Management;Gait training;Stair training;Functional mobility training;Therapeutic activities;Therapeutic exercise;Balance training;Neuromuscular re-education;Manual techniques;Patient/family education;Electrical Stimulation;Moist Heat;Cryotherapy    PT Next Visit Plan Progress LE strengthening.    PT Home Exercise Plan Shoulder pulleys in flex, scaption, abd, pendelums, scap retractions with resistance (RTB), D2 flexion pattern in sitting no resistance.    Consulted and Agree with Plan of Care Patient             Patient will benefit from skilled therapeutic intervention in order to improve the following deficits and impairments:  Abnormal gait, Decreased activity tolerance, Decreased balance, Decreased coordination, Decreased mobility, Decreased endurance, Decreased range of motion, Decreased strength, Difficulty walking, Dizziness, Impaired UE functional use, Pain, Hypomobility  Visit Diagnosis: Difficulty in walking, not elsewhere classified  Muscle weakness (generalized)     Problem List Patient Active Problem List   Diagnosis Date Noted   Hemorrhoid 10/18/2021   Obesity, Class III, BMI 40-49.9 (morbid obesity) ([place])    Atrial fibrillation with RVR ([place]) 07/14/2020   Persistent atrial fibrillation ([place]) 07/14/2020   Hypertension    Hemiparesis affecting right side as late effect of cerebrovascular accident ([place]) 08/13/2019    History of kidney stones 05/02/2018   ED (erectile dysfunction) 05/02/2018   Hyperglycemia 01/30/2017   Arthritis of knee, degenerative 01/30/2017   BPH with obstruction/lower urinary tract symptoms 02/03/2016   Gross hematuria 02/03/2016   Dyslipidemia 08/04/2015   Diverticulitis of large intestine without perforation or abscess without bleeding 06/30/2015   Edema 06/30/2015   OSA on CPAP 06/30/2015   Pura Spice, PT, DPT # [id number] Cleopatra Cedar, SPT 01/18/2022, 11:12 AM  [facility] [address] [place], [place], [address] Phone: [phone]   Fax:  [phone]  Name: Tom Underwood MRN: [id number] Date of Birth: Apr 25, 1951

## 2022-01-20 ENCOUNTER — Ambulatory Visit: Payer: Medicare Other | Admitting: Physical Therapy

## 2022-01-20 ENCOUNTER — Other Ambulatory Visit: Payer: Self-pay

## 2022-01-20 ENCOUNTER — Encounter: Payer: Self-pay | Admitting: Physical Therapy

## 2022-01-20 VITALS — BP 131/53 | HR 60

## 2022-01-20 DIAGNOSIS — R2681 Unsteadiness on feet: Secondary | ICD-10-CM

## 2022-01-20 DIAGNOSIS — R262 Difficulty in walking, not elsewhere classified: Secondary | ICD-10-CM

## 2022-01-20 DIAGNOSIS — M6281 Muscle weakness (generalized): Secondary | ICD-10-CM | POA: Diagnosis not present

## 2022-01-20 DIAGNOSIS — R269 Unspecified abnormalities of gait and mobility: Secondary | ICD-10-CM | POA: Diagnosis not present

## 2022-01-20 NOTE — Therapy (Addendum)
[place] [address] [place], [place], [address] Phone: [phone]   Fax:  [phone]  Physical Therapy Treatment  Patient Details  Name: Tom Underwood MRN: [id number] Date of Birth: 1951-04-14 Referring Provider (PT): Steele Sizer, MD   Encounter Date: 01/20/2022   PT End of Session - 01/20/22 1245     Visit Number 11    Number of Visits 25    Date for PT Re-Evaluation 02/17/22    Authorization Type Medicare A&B; generic commercial (visit limits based on medical necessity)    Authorization - Visit Number 1    Authorization - Number of Visits 10    PT Start Time 1245    PT Stop Time 1350    PT Time Calculation (min) 65 min    Equipment Utilized During Treatment Gait belt    Activity Tolerance Patient limited by fatigue;Patient limited by pain    Behavior During Therapy WFL for tasks assessed/performed             Past Medical History:  Diagnosis Date   Allergy    Decreased libido    Diverticulitis    GERD (gastroesophageal reflux disease)    HLD (hyperlipidemia)    Hydronephrosis with renal and ureteral calculus obstruction    Hydronephrosis with renal and ureteral calculus obstruction    Hypertension    Hypogonadism in male    Rhinitis, allergic    Sleep apnea    Stroke [facility])    Ureteral stone     Past Surgical History:  Procedure Laterality Date   CARDIOVERSION N/A 12/08/2021   Procedure: CARDIOVERSION;  Surgeon: Nelva Bush, MD;  Location: [facility] ORS;  Service: Cardiovascular;  Laterality: N/A;   EXTERNAL EAR SURGERY     FINGER SURGERY     KNEE SURGERY     left eye      Vitals:   01/20/22 1301  BP: (!) 131/53  Pulse: 60  SpO2: 96%     Subjective Assessment - 01/20/22 1301     Subjective Pt. has minimal pain in B knees today and some LBP. Pt. states he is feeling extra fatigued since this is his second PT session of the week.    Pertinent History Zackerie Sara is well known to PT  clinic. Presenting to eval today for further LE strengthening, gait, and balance deficits. Reporting since stroke, MD told him he lost ~60% of his LE strength. Current baseline is household and short community stances with SPC. Does endorse difficulty in safe ability to get in/out of shower, donning/doffing socks indep, long distance ambulation, endorses wishing to improve his balance where he can use two hands for ADL's and not feel like he needs to rely on SUE on an object for safety. Pt reports ability to safely perform STS but wishes to improve LE endurance. States he is safe with his 5 stairs to enter/exit home with use of handrail and SPC. Denies any falls in the last 6 months. These are all goals pt wishes to accomplish and improve upon.    Limitations Walking;Standing;Lifting;House hold activities    How long can you sit comfortably? unlimited    How long can you stand comfortably? 15-20 minutes    How long can you walk comfortably? 15 minutes    Patient Stated Goals Improve balance, LE strength/endurance.    Currently in Pain? Yes    Pain Score 2     Pain Location Knee    Pain Orientation Right;Left  Pain Score 4    Pain Location Back             Ther.ex.:   Nustep L5 12 min. B UE/LE.  (consistent cadence)   Nautilus (in standing): 60# lat. Pull downs/ 50# tricep extension/ 30# bicep curls/ 50# chest press 20x each.   Standing BlueTB shoulder extension x20    Walking in //-bars  forward/backward focusing on standing tall and heel strike with each step. 4 laps.   Lateral Walking in //-bars with minimal CGA 3x      PT Short Term Goals - 12/24/21 0810       PT SHORT TERM GOAL #1   Title Pt will be indep with HEP to improve strength and balance to reduce risk of falls.    Baseline 11/23: initiated    Time 4    Period Weeks    Status Achieved               PT Long Term Goals - 01/18/22 1025       PT LONG TERM GOAL #1   Title Pt will improve FOTO score to  target to display improvement in functional mobility.    Baseline 11/23: 29/54    Time 8    Period Weeks    Status Partially Met    Target Date 02/17/22      PT LONG TERM GOAL #2   Title Pt wil improve TUG score to < 12 sec with no AD to display significant imporvement in reduced risk of falls    Baseline 11/23: 26 sec (did not sit down immediately leading to higher time than he would've scored) 1/19: 14.44 sec    Time 8    Period Weeks    Status Partially Met    Target Date 02/17/22      PT LONG TERM GOAL #3   Title Pt will improve DGI to >19/24 to indicate decreased risk of falls with household and community walking tasks    Baseline 11/23: 11/24 1/19:12/24    Time 8    Period Weeks    Status Partially Met    Target Date 02/17/22      PT LONG TERM GOAL #4   Title Pt will improve 30 sec STS >12 reps to display clinically significnat improvement in LE strength/endurance for functional mobility.    Baseline 11/23: 7 reps 1/19: 10.5 reps    Time 8    Period Weeks    Status Partially Met    Target Date 02/17/22                   Plan - 01/20/22 1548     Clinical Impression Statement Pt. notes swelling is continuing to get better as the week goes on. Pt. arrives to second PT session of the week stating he is still feeling tired from the last session. Pt. performs all exercises with CGA for safety throughout the tx. session. Pt. is able to perform all exercises but does have some general unsteadiness during gait. Pt. will continue to benefit from regular PT to maintain cardiovascular function and increase muscular endurance in a safe environment.  Pt. is constantly monitored via O2 sats and they remain >95% for safety.    Personal Factors and Comorbidities Comorbidity 2    Comorbidities Hypertension, Obesity    Examination-Activity Limitations Bed Mobility;Reach Overhead;Squat;Lift;Dressing;Hygiene/Grooming    Examination-Participation Restrictions Community Activity;Yard  Work    Estate manager/land agent  Making Moderate    Rehab Potential Fair    PT Frequency 2x / week    PT Duration 8 weeks    PT Treatment/Interventions ADLs/Self Care Home Management;Gait training;Stair training;Functional mobility training;Therapeutic activities;Therapeutic exercise;Balance training;Neuromuscular re-education;Manual techniques;Patient/family education;Electrical Stimulation;Moist Heat;Cryotherapy    PT Next Visit Plan Progress LE strengthening.    PT Home Exercise Plan Shoulder pulleys in flex, scaption, abd, pendelums, scap retractions with resistance (RTB), D2 flexion pattern in sitting no resistance.    Consulted and Agree with Plan of Care Patient             Patient will benefit from skilled therapeutic intervention in order to improve the following deficits and impairments:  Abnormal gait, Decreased activity tolerance, Decreased balance, Decreased coordination, Decreased mobility, Decreased endurance, Decreased range of motion, Decreased strength, Difficulty walking, Dizziness, Impaired UE functional use, Pain, Hypomobility  Visit Diagnosis: Difficulty in walking, not elsewhere classified  Muscle weakness (generalized)  Unsteadiness on feet     Problem List Patient Active Problem List   Diagnosis Date Noted   Hemorrhoid 10/18/2021   Obesity, Class III, BMI 40-49.9 (morbid obesity) ([place])    Atrial fibrillation with RVR ([place]) 07/14/2020   Persistent atrial fibrillation ([place]) 07/14/2020   Hypertension    Hemiparesis affecting right side as late effect of cerebrovascular accident ([place]) 08/13/2019   History of kidney stones 05/02/2018   ED (erectile dysfunction) 05/02/2018   Hyperglycemia 01/30/2017   Arthritis of knee, degenerative 01/30/2017   BPH with obstruction/lower urinary tract symptoms 02/03/2016   Gross hematuria 02/03/2016   Dyslipidemia 08/04/2015   Diverticulitis of large intestine  without perforation or abscess without bleeding 06/30/2015   Edema 06/30/2015   OSA on CPAP 06/30/2015   Pura Spice, PT, DPT # [id number] Cleopatra Cedar, SPT 01/20/2022, 4:17 PM  [facility] [address] [place], [place], [address] Phone: [phone]   Fax:  [phone]  Name: Tom Underwood MRN: [id number] Date of Birth: 06/16/1951

## 2022-01-24 ENCOUNTER — Other Ambulatory Visit: Payer: Self-pay | Admitting: Family Medicine

## 2022-01-24 DIAGNOSIS — K219 Gastro-esophageal reflux disease without esophagitis: Secondary | ICD-10-CM

## 2022-01-24 NOTE — Telephone Encounter (Signed)
Pt cask to add the pantprazole 30 mg for 90 days if possible [name] in [place]

## 2022-01-24 NOTE — Telephone Encounter (Signed)
Medication: Generic Lasix   Has the patient contacted their pharmacy? No.  New pharmacy (Agent: If no, request that the patient contact the pharmacy for the refill. If patient does not wish to contact the pharmacy document the reason why and proceed with request.) (Agent: If yes, when and what did the pharmacy advise?)  Preferred Pharmacy (with phone number or street name): [name] [place] Has the patient been seen for an appointment in the last year OR does the patient have an upcoming appointment? yes  Agent: Please be advised that RX refills may take up to 3 business days. We ask that you follow-up with your pharmacy.

## 2022-01-25 ENCOUNTER — Telehealth: Payer: Self-pay

## 2022-01-25 ENCOUNTER — Other Ambulatory Visit: Payer: Self-pay

## 2022-01-25 MED ORDER — FUROSEMIDE 20 MG PO TABS: 20.0000 mg | ORAL_TABLET | ORAL | 1 refills | Status: DC | PRN

## 2022-01-25 MED ORDER — PANTOPRAZOLE SODIUM 20 MG PO TBEC: 20.0000 mg | DELAYED_RELEASE_TABLET | Freq: Every day | ORAL | 0 refills | Status: DC

## 2022-01-25 NOTE — Telephone Encounter (Signed)
Requested medication (s) are due for refill today: no  Requested medication (s) are on the active medication list: yes  Last refill:  12/13/21 #30/1  Future visit scheduled: yes  Notes to clinic:  Unable to refill per protocol, last refill by another provider.      Requested Prescriptions  Pending Prescriptions Disp Refills   furosemide (LASIX) 20 MG tablet 30 tablet 1    Sig: Take 1 tablet (20 mg total) by mouth as needed. For lower extremity swelling.     Cardiovascular:  Diuretics - Loop Failed - 01/25/2022 10:44 AM      Failed - Mg Level in normal range and within 180 days    Magnesium  Date Value Ref Range Status  07/14/2020 2.0 1.7 - 2.4 mg/dL Final    Comment:    Performed at [facility], [address] [place], [place] [address]          Passed - K in normal range and within 180 days    Potassium  Date Value Ref Range Status  11/15/2021 4.2 3.5 - 5.2 mmol/L Final          Passed - Ca in normal range and within 180 days    Calcium  Date Value Ref Range Status  11/15/2021 10.1 8.6 - 10.2 mg/dL Final          Passed - Na in normal range and within 180 days    Sodium  Date Value Ref Range Status  11/15/2021 142 134 - 144 mmol/L Final          Passed - Cr in normal range and within 180 days    Creatinine, Ser  Date Value Ref Range Status  11/15/2021 1.03 0.76 - 1.27 mg/dL Final          Passed - Cl in normal range and within 180 days    Chloride  Date Value Ref Range Status  11/15/2021 103 96 - 106 mmol/L Final          Passed - Last BP in normal range    BP Readings from Last 1 Encounters:  01/20/22 (!) 131/53          Passed - Valid encounter within last 6 months    Recent Outpatient Visits           2 months ago Gait instability   [facility] Alba Cory, MD   3 months ago Need for influenza vaccination   [facility] [place] Dense M, DO   7 months ago Acute diverticulitis   [facility] Alba Cory, MD   8 months ago Hypertension, benign   [facility] Alba Cory, MD   1 year ago Atrial fibrillation with RVR [facility])   [facility] Alba Cory, MD       Future Appointments             In 1 week Agbor-Etang, Arlys John, MD [facility]   In 3 months  [facility]   In 3 months Alba Cory, MD [facility], [facility]   In 5 months McGowan, Elana Alm [facility] [place]            Signed Prescriptions Disp Refills   pantoprazole (PROTONIX) 20 MG tablet 90 tablet 0    Sig: Take 1 tablet (20 mg total) by mouth daily.     Gastroenterology: Proton Pump Inhibitors Passed -  01/25/2022 10:44 AM      Passed - Valid encounter within last 12 months    Recent Outpatient Visits           2 months ago Gait instability   [facility] Alba Cory, MD   3 months ago Need for influenza vaccination   [facility] Caro Laroche, DO   7 months ago Acute diverticulitis   [facility] Alba Cory, MD   8 months ago Hypertension, benign   [facility] Alba Cory, MD   1 year ago Atrial fibrillation with RVR [facility])   [facility] Alba Cory, MD       Future Appointments             In 1 week Agbor-Etang, Arlys John, MD [facility], [facility]   In 3 months  [facility], [place]   In 3 months Alba Cory, MD [facility], [facility]   In 5 months McGowan, Elana Alm [facility] [place]

## 2022-01-25 NOTE — Telephone Encounter (Signed)
Requested Prescriptions  Pending Prescriptions Disp Refills   furosemide (LASIX) 20 MG tablet 30 tablet 1    Sig: Take 1 tablet (20 mg total) by mouth as needed. For lower extremity swelling.     Cardiovascular:  Diuretics - Loop Failed - 01/25/2022 10:44 AM      Failed - Mg Level in normal range and within 180 days    Magnesium  Date Value Ref Range Status  07/14/2020 2.0 1.7 - 2.4 mg/dL Final    Comment:    Performed at [facility], [address] [place], [place] [address]         Passed - K in normal range and within 180 days    Potassium  Date Value Ref Range Status  11/15/2021 4.2 3.5 - 5.2 mmol/L Final         Passed - Ca in normal range and within 180 days    Calcium  Date Value Ref Range Status  11/15/2021 10.1 8.6 - 10.2 mg/dL Final         Passed - Na in normal range and within 180 days    Sodium  Date Value Ref Range Status  11/15/2021 142 134 - 144 mmol/L Final         Passed - Cr in normal range and within 180 days    Creatinine, Ser  Date Value Ref Range Status  11/15/2021 1.03 0.76 - 1.27 mg/dL Final         Passed - Cl in normal range and within 180 days    Chloride  Date Value Ref Range Status  11/15/2021 103 96 - 106 mmol/L Final         Passed - Last BP in normal range    BP Readings from Last 1 Encounters:  01/20/22 (!) 131/53         Passed - Valid encounter within last 6 months    Recent Outpatient Visits          2 months ago Gait instability   [facility] Alba Cory, MD   3 months ago Need for influenza vaccination   [facility] [place] Dense M, DO   7 months ago Acute diverticulitis   [facility] Alba Cory, MD   8 months ago Hypertension, benign   [facility] Alba Cory, MD   1 year ago Atrial fibrillation with RVR [facility])   [facility] Alba Cory, MD      Future Appointments            In 1  week Agbor-Etang, Arlys John, MD [facility], [facility]   In 3 months  [facility], [facility]   In 3 months Alba Cory, MD [facility], [facility]   In 5 months McGowan, Wellington Hampshire, PA-C [facility]            pantoprazole (PROTONIX) 20 MG tablet 90 tablet 0    Sig: Take 1 tablet (20 mg total) by mouth daily.     Gastroenterology: Proton Pump Inhibitors Passed - 01/25/2022 10:44 AM      Passed - Valid encounter within last 12 months    Recent Outpatient Visits          2 months ago Gait instability   [facility] Alba Cory, MD   3 months ago Need for influenza vaccination   [facility] Caro Lar[place], [place]  7 months ago Acute diverticulitis   [facility] Alba Cory, MD   8 months ago Hypertension, benign   [facility] Alba Cory, MD   1 year ago Atrial fibrillation with RVR [facility])   [facility] Alba Cory, MD      Future Appointments            In 1 week Agbor-Etang, Arlys John, MD [facility], [facility]   In 3 months  [facility], [place]   In 3 months Alba Cory, MD [facility], [facility]   In 5 months McGowan, Elana Alm [facility] [place]

## 2022-01-25 NOTE — Progress Notes (Signed)
° ° °  Chronic Care Management Pharmacy Assistant   Name: Tom Underwood  MRN: [id number] DOB: 07-17-1951  Patient called to be reminded of his telephone appointment with Junius Argyle, CPP on 01/26/2022 @ 1100  Patient aware of appointment date, time, and type of appointment (either telephone or in person). Patient aware to have/bring all medications, supplements, blood pressure and/or blood sugar logs to visit.  Questions: Are there any concerns you would like to discuss during your office visit? Spoke to his spouse who stated the appointment was okay as patient was on his other phone  Star Rating Drug: Rosuvastatin 20 mg last filled on 12/13/2021 for a 90-Day supply with [name] Valsartan 80 mg last filled on 12/13/2021 for a 90-Day supply with [name]  Any gaps in medications fill history? No  Care Gaps: Zoster Vaccine Covid-19 Vaccine Booster [place], CPA/CMA Clinical Pharmacist Assistant Phone: [phone]

## 2022-01-26 ENCOUNTER — Ambulatory Visit (INDEPENDENT_AMBULATORY_CARE_PROVIDER_SITE_OTHER): Payer: Medicare Other

## 2022-01-26 DIAGNOSIS — I10 Essential (primary) hypertension: Secondary | ICD-10-CM

## 2022-01-26 DIAGNOSIS — N138 Other obstructive and reflux uropathy: Secondary | ICD-10-CM

## 2022-01-26 DIAGNOSIS — N401 Enlarged prostate with lower urinary tract symptoms: Secondary | ICD-10-CM

## 2022-01-26 NOTE — Patient Instructions (Signed)
Visit Information It was great speaking with you today!  Please let me know if you have any questions about our visit.   Goals Addressed             This Visit's Progress    Track and Manage My Blood Pressure-Hypertension   On track    Timeframe:  Long-Range Goal Priority:  High Start Date:  07/22/2021                            Expected End Date:  07/22/2022                     Follow Up within 90 days   - check blood pressure weekly    Why is this important?   You won't feel high blood pressure, but it can still hurt your blood vessels.  High blood pressure can cause heart or kidney problems. It can also cause a stroke.  Making lifestyle changes like losing a little weight or eating less salt will help.  Checking your blood pressure at home and at different times of the day can help to control blood pressure.  If the doctor prescribes medicine remember to take it the way the doctor ordered.  Call the office if you cannot afford the medicine or if there are questions about it.     Notes:         Patient Care Plan: General Pharmacy (Adult)     Problem Identified: Hypertension, Hyperlipidemia, Atrial Fibrillation, Anxiety, and Insomnia   Priority: High     Long-Range Goal: Patient-Specific Goal   Start Date: 07/22/2021  Expected End Date: 07/22/2022  This Visit's Progress: On track  Recent Progress: On track  Priority: High  Note:   Current Barriers:  Unable to independently afford treatment regimen  Pharmacist Clinical Goal(s):  Patient will verbalize ability to afford treatment regimen maintain control of blood pressure as evidenced by BP less than 140/90  through collaboration with PharmD and provider.   Interventions: 1:1 collaboration with Alba Cory, MD regarding development and update of comprehensive plan of care as evidenced by provider attestation and co-signature Inter-disciplinary care team collaboration (see longitudinal plan of  care) Comprehensive medication review performed; medication list updated in electronic medical record  Hypertension (BP goal <130/80) -Controlled -Current treatment: Furosemide 20 mg daily as needed: Appropriate, Effective, Safe, Accessible  Metoprolol tartrate 50 mg 1/2 tablet twice daily: Appropriate, Effective, Safe, Accessible  Valsartan 80 mg daily: Appropriate, Effective, Safe, Accessible -Medications previously tried: NA  -Current home readings: 134/73,  -Office blood pressures often elevated due to white coat hypertension.  -Current dietary habits: trying to limit salt, greasy/fried foods. -Current exercise habits: Continues with rehab once weekly. Walks a few times weekly weather permitting.  -Denies hypotensive/hypertensive symptoms.  -Recommended to continue current medication  Hyperlipidemia: (LDL goal < 70) -Controlled -Stroke 2020 -Current treatment: Lovaza 1g 2 caps twice daily  Rosuvastatin 20 mg daily  -Medications previously tried: NA  -Recommended to continue current medication  Atrial Fibrillation (Goal: prevent stroke and major bleeding) -Controlled -CHADSVASC: 4 -Current treatment: Rate control: Metoprolol tartrate 50 mg twice daily  Anticoagulation: Xarelto 20 mg daily  -Medications previously tried: NA -Patient reports he remains asymptomatic.  -Cardiology to help patient renew Xarelto prescription for 2023.  -Recommended to continue current medication  Depression/Anxiety/Insomnia(Goal: Maintain stable mood) -Controlled -Counseling with Dr. Bosie Clos  -Current treatment: Amitriptyline 25 mg nightly as needed Wellbutrin XL  150 mg daily  Buspirone 10 mg twice daily (Often forgets PM dose)  -Medications previously tried/failed: NA -Recommended to continue current medication  BPH (Goal: Minimize symptoms) -Controlled -Current treatment  Tamsulosin 0.4 mg daily: Appropriate, Effective, Safe, Accessible -Medications previously tried: NA -More  urinary frequency throughout the day.  Mild improvement in symptoms since starting tamsulosin. Still has to use the restroom every 2-3 hours.  -Recommended to continue current medication   Patient Goals/Self-Care Activities Patient will:  - check blood pressure weekly, document, and provide at future appointments target a minimum of 150 minutes of moderate intensity exercise weekly  Follow Up Plan: Telephone follow up appointment with care management team member scheduled for:  07/20/2022 at 11:00 AM      Patient agreed to services and verbal consent obtained.   Patient verbalizes understanding of instructions and care plan provided today and agrees to view in MyChart. Active MyChart status confirmed with patient.    Cheyenne Adas, CPP Clinical Pharmacist Practitioner  [facility] [phone]

## 2022-01-26 NOTE — Progress Notes (Signed)
Chronic Care Management Pharmacy Note  01/26/2022 Name:  Tom Underwood MRN:  [id number] DOB:  01-28-1951  Summary: Patient presents for CCM follow-up.  -Mild improvement in urinary frequency since starting tamsulosin. Still has to use the restroom every 2-3 hours.    Recommendations/Changes made from today's visit: Continue current medications Patient given contact information for [place]: CPP follow-up 6 months   Subjective: Tom Underwood is an 71 y.o. year old male who is a primary patient of Steele Sizer, MD.  The CCM team was consulted for assistance with disease management and care coordination needs.    Engaged with patient by telephone for follow up visit in response to provider referral for pharmacy case management and/or care coordination services.   Consent to Services:  The patient was given information about Chronic Care Management services, agreed to services, and gave verbal consent prior to initiation of services.  Please see initial visit note for detailed documentation.   Patient Care Team: Steele Sizer, MD as PCP - General (Family Medicine) Kate Sable, MD as PCP - Cardiology (Cardiology) Vickie Epley, MD as PCP - Electrophysiology (Cardiology) Gardiner Barefoot, DPM as Consulting Physician (Podiatry) Ernestine Conrad Gordan Payment as Physician Assistant (Urology) Germaine Pomfret, [facility] (Pharmacist)  Recent office visits: 11/02/21: Patient presented to Dr. Ancil Boozer for follow-up. Tamsulosin started. Referral to PT.   Recent consult visits: 12/30/21: Patient presented to Dr. Manuella Ghazi (Neurology) for follow-up.  12/29/21: Patient presented to Dr. Quentin Ore (Cardiology) for follow-up.   Hospital visits: 12/08/21: Cardioversion.    Objective:  Lab Results  Component Value Date   CREATININE 1.03 11/15/2021   BUN 15 11/15/2021   GFRNONAA >60 08/31/2021   GFRAA >60 07/17/2020   NA 142 11/15/2021   K 4.2 11/15/2021    CALCIUM 10.1 11/15/2021   CO2 22 11/15/2021   GLUCOSE 112 (H) 11/15/2021    Lab Results  Component Value Date/Time   HGBA1C 5.5 05/28/2021 01:36 PM   HGBA1C 5.8 (H) 05/18/2020 11:26 AM    Last diabetic Eye exam: No results found for: HMDIABEYEEXA  Last diabetic Foot exam: No results found for: HMDIABFOOTEX   Lab Results  Component Value Date   CHOL 120 05/28/2021   HDL 51 05/28/2021   LDLCALC 53 05/28/2021   TRIG 79 05/28/2021   CHOLHDL 2.4 05/28/2021    Hepatic Function Latest Ref Rng & Units 08/31/2021 05/28/2021 07/14/2020  Total Protein 6.5 - 8.1 g/dL 8.2(H) 7.6 7.8  Albumin 3.5 - 5.0 g/dL 4.2 4.8 4.2  AST 15 - 41 U/L 21 19 20   ALT 0 - 44 U/L 26 25 26   Alk Phosphatase 38 - 126 U/L 109 113 90  Total Bilirubin 0.3 - 1.2 mg/dL 1.7(H) 2.5(H) 1.8(H)    Lab Results  Component Value Date/Time   TSH 2.530 05/28/2021 01:36 PM   TSH 2.986 07/14/2020 11:56 PM   TSH 2.311 07/14/2020 05:19 PM   TSH 2.400 08/07/2015 09:22 AM    CBC Latest Ref Rng & Units 11/15/2021 08/31/2021 05/28/2021  WBC 3.4 - 10.8 x10E3/uL 6.2 8.0 10.6  Hemoglobin 13.0 - 17.7 g/dL 17.8(H) 17.2(H) 17.2  Hematocrit 37.5 - 51.0 % 50.7 48.1 48.6  Platelets 150 - 450 x10E3/uL 240 253 194    Lab Results  Component Value Date/Time   VD25OH 64.6 05/28/2021 01:36 PM    Clinical ASCVD: Yes  The ASCVD Risk score (Arnett DK, et al., 2019) failed to calculate for the following reasons:   The  patient has a prior MI or stroke diagnosis    Depression screen [facility] 2/9 11/02/2021 10/18/2021 10/18/2021  Decreased Interest 0 0 0  Down, Depressed, Hopeless 1 0 0  PHQ - 2 Score 1 0 0  Altered sleeping 0 0 0  Tired, decreased energy 0 0 0  Change in appetite 0 0 0  Feeling bad or failure about yourself  0 0 0  Trouble concentrating 0 0 0  Moving slowly or fidgety/restless 0 0 0  Suicidal thoughts 0 0 0  PHQ-9 Score 1 0 0  Difficult doing work/chores - Not difficult at all Not difficult at all  Some recent data might  be hidden    Social History   Tobacco Use  Smoking Status Former   Types: Pipe   Start date: 02/20/1973   Quit date: 02/20/1974   Years since quitting: 47.9  Smokeless Tobacco Never   BP Readings from Last 3 Encounters:  01/20/22 (!) 131/53  01/17/22 (!) 142/65  01/06/22 (!) 124/59   Pulse Readings from Last 3 Encounters:  01/20/22 60  01/17/22 (!) 46  01/06/22 65   Wt Readings from Last 3 Encounters:  12/08/21 286 lb (129.7 kg)  11/15/21 288 lb (130.6 kg)  11/02/21 288 lb (130.6 kg)   BMI Readings from Last 3 Encounters:  12/29/21 41.04 kg/m  12/08/21 41.04 kg/m  11/15/21 40.74 kg/m    Assessment/Interventions: Review of patient past medical history, allergies, medications, health status, including review of consultants reports, laboratory and other test data, was performed as part of comprehensive evaluation and provision of chronic care management services.   SDOH:  (Social Determinants of Health) assessments and interventions performed: Yes SDOH Interventions    Flowsheet Row Most Recent Value  SDOH Interventions   Financial Strain Interventions Intervention Not Indicated       SDOH Screenings   Alcohol Screen: Low Risk    Last Alcohol Screening Score (AUDIT): 0  Depression (PHQ2-9): Low Risk    PHQ-2 Score: 1  Financial Resource Strain: Low Risk    Difficulty of Paying Living Expenses: Not hard at all  Food Insecurity: No Food Insecurity   Worried About Charity fundraiser in the Last Year: Never true   Ran Out of Food in the Last Year: Never true  Housing: Low Risk    Last Housing Risk Score: 0  Physical Activity: Insufficiently Active   Days of Exercise per Week: 4 days   Minutes of Exercise per Session: 10 min  Social Connections: Engineer, building services of Communication with Friends and Family: More than three times a week   Frequency of Social Gatherings with Friends and Family: Twice a week   Attends Religious Services: More than 4  times per year   Active Member of [employer] or Organizations: Yes   Attends Music therapist: More than 4 times per year   Marital Status: Married  Stress: Stress Concern Present   Feeling of Stress : To some extent  Tobacco Use: Medium Risk   Smoking Tobacco Use: Former   Smokeless Tobacco Use: Never   Passive Exposure: Not on file  Transportation Needs: No Transportation Needs   Lack of Transportation (Medical): No   Lack of Transportation (Non-Medical): No    CCM Care Plan  Allergies  Allergen Reactions   Penicillins Itching   Propofol Other (See Comments)    Skin burning and involuntary muscle twitching    Medications Reviewed Today     Reviewed  by Cleopatra Cedar, Student-PT (Student-PT) on 01/20/22 at 1547  Med List Status: <None>   Medication Order Taking? Sig Documenting Provider Last Dose Status Informant  acetaminophen (TYLENOL) 650 MG CR tablet [id number] No Take 1,300 mg by mouth every evening. [provider] Taking Active Self           Med Note Janan Ridge   Fri Apr 23, 2021  9:35 AM)    amitriptyline (ELAVIL) 25 MG tablet [id number] No TAKE 1 TABLET BY MOUTH AT BEDTIME AS NEEDED FOR SLEEP Sowles, Drue Stager, MD Taking Active Self  buPROPion (WELLBUTRIN XL) 150 MG 24 hr tablet [id number] No Take 1 tablet (150 mg total) by mouth every morning. Steele Sizer, MD Taking Active   busPIRone (BUSPAR) 10 MG tablet [id number] No Take 1 tablet (10 mg total) by mouth 2 (two) times daily. Steele Sizer, MD Taking Active   cetirizine (ZYRTEC) 10 MG tablet [id number] No Take 10 mg by mouth in the morning.  [provider] Taking Active Self  Cholecalciferol (VITAMIN D) 125 MCG (5000 UT) CAPS [id number] No Take 5,000 Units by mouth daily. [provider] Taking Active Self  furosemide (LASIX) 20 MG tablet [id number] No Take 1 tablet (20 mg total) by mouth as needed. For lower extremity swelling. Kate Sable, MD Taking Active    GLUCOSAMINE-CHONDROITIN PO [id number] No Take 2 tablets by mouth in the morning and at bedtime. [provider] Taking Active Self  Guaifenesin [facility] MAXIMUM STRENGTH) 1200 MG TB12 [id number] No Take 1,200 mg by mouth in the morning and at bedtime.  Patient not taking: Reported on 12/29/2021   [provider] Not Taking Active Self  hydrocortisone (ANUSOL-HC) 25 MG suppository [id number] No Place 1 suppository (25 mg total) rectally every 12 (twelve) hours.  Patient taking differently: Place 25 mg rectally 2 (two) times daily as needed for hemorrhoids.   Vallarie Mare M, PA-C Taking Active Self  metoprolol tartrate (LOPRESSOR) 50 MG tablet [id number] No Take 0.5 tablets (25 mg total) by mouth 2 (two) times daily. Nelva Bush, MD Taking Expired 01/07/22 2359   Multiple Vitamin (MULTIVITAMIN) tablet [id number] No Take 1 tablet by mouth daily. [provider] Taking Active Self  omega-3 acid ethyl esters (LOVAZA) 1 g capsule [id number] No Take 2 capsules (2 g total) by mouth 2 (two) times daily. Steele Sizer, MD Taking Active   pantoprazole (PROTONIX) 20 MG tablet [id number] No Take 1 tablet by mouth once daily Steele Sizer, MD Taking Active Self  rivaroxaban (XARELTO) 20 MG TABS tablet [id number] No Take 1 tablet (20 mg total) by mouth daily with supper. Kate Sable, MD Taking Active   rosuvastatin (CRESTOR) 20 MG tablet [id number] No Take 1 tablet (20 mg total) by mouth daily. Kate Sable, MD Taking Active   Saccharomyces boulardii (PROBIOTIC) 250 MG CAPS [id number] No Take 1 capsule by mouth daily. Steele Sizer, MD Taking Active Self  tamsulosin [facility]) 0.4 MG CAPS capsule [id number] No Take 1 capsule (0.4 mg total) by mouth daily. Steele Sizer, MD Taking Active   valsartan (DIOVAN) 80 MG tablet [id number] No Take 1 tablet (80 mg total) by mouth daily. Kate Sable, MD Taking Active   VISINE DRY EYE RELIEF 1 % SOLN [id number] No Place 1 drop  into both eyes daily as needed (dry eyes). [provider] Taking Active Self            Patient Active Problem List   Diagnosis Date Noted   Hemorrhoid 10/18/2021   Obesity,  Class III, BMI 40-49.9 (morbid obesity) (HCC)    Atrial fibrillation with RVR (HCC) 07/14/2020   Persistent atrial fibrillation ([place]) 07/14/2020   Hypertension    Hemiparesis affecting right side as late effect of cerebrovascular accident ([place]) 08/13/2019   History of kidney stones 05/02/2018   ED (erectile dysfunction) 05/02/2018   Hyperglycemia 01/30/2017   Arthritis of knee, degenerative 01/30/2017   BPH with obstruction/lower urinary tract symptoms 02/03/2016   Gross hematuria 02/03/2016   Dyslipidemia 08/04/2015   Diverticulitis of large intestine without perforation or abscess without bleeding 06/30/2015   Edema 06/30/2015   OSA on CPAP 06/30/2015    Immunization History  Administered Date(s) Administered   Fluad Quad(high Dose 65+) 09/18/2019, 11/10/2020, 10/18/2021   Influenza, High Dose Seasonal PF 10/05/2018   Influenza,inj,Quad PF,6+ Mos 08/30/2017   Influenza-Unspecified 09/26/2016   Moderna Sars-Covid-2 Vaccination 01/21/2020, 02/18/2020, 11/23/2020   Pneumococcal Conjugate-13 11/05/2019   Pneumococcal Polysaccharide-23 10/19/2018   Tdap 02/16/2018, 07/14/2020   Zoster, Live 02/24/2017    Conditions to be addressed/monitored:  Hypertension, Hyperlipidemia, Atrial Fibrillation, Anxiety, and Insomnia  Care Plan : General Pharmacy (Adult)  Updates made by Germaine Pomfret, RPH since 01/26/2022 12:00 AM     Problem: Hypertension, Hyperlipidemia, Atrial Fibrillation, Anxiety, and Insomnia   Priority: High     Long-Range Goal: Patient-Specific Goal   Start Date: 07/22/2021  Expected End Date: 07/22/2022  This Visit's Progress: On track  Recent Progress: On track  Priority: High  Note:   Current Barriers:  Unable to independently afford treatment regimen  Pharmacist  Clinical Goal(s):  Patient will verbalize ability to afford treatment regimen maintain control of blood pressure as evidenced by BP less than 140/90  through collaboration with PharmD and provider.   Interventions: 1:1 collaboration with Steele Sizer, MD regarding development and update of comprehensive plan of care as evidenced by provider attestation and co-signature Inter-disciplinary care team collaboration (see longitudinal plan of care) Comprehensive medication review performed; medication list updated in electronic medical record  Hypertension (BP goal <130/80) -Controlled -Current treatment: Furosemide 20 mg daily as needed: Appropriate, Effective, Safe, Accessible  Metoprolol tartrate 50 mg 1/2 tablet twice daily: Appropriate, Effective, Safe, Accessible  Valsartan 80 mg daily: Appropriate, Effective, Safe, Accessible -Medications previously tried: NA  -Current home readings: 134/73,  -Office blood pressures often elevated due to white coat hypertension.  -Current dietary habits: trying to limit salt, greasy/fried foods. -Current exercise habits: Continues with rehab once weekly. Walks a few times weekly weather permitting.  -Denies hypotensive/hypertensive symptoms.  -Recommended to continue current medication  Hyperlipidemia: (LDL goal < 70) -Controlled -Stroke 2020 -Current treatment: Lovaza 1g 2 caps twice daily  Rosuvastatin 20 mg daily  -Medications previously tried: NA  -Recommended to continue current medication  Atrial Fibrillation (Goal: prevent stroke and major bleeding) -Controlled -CHADSVASC: 4 -Current treatment: Rate control: Metoprolol tartrate 50 mg twice daily  Anticoagulation: Xarelto 20 mg daily  -Medications previously tried: NA -Patient reports he remains asymptomatic.  -Cardiology to help patient renew Xarelto prescription for 2023.  -Recommended to continue current medication  Depression/Anxiety/Insomnia(Goal: Maintain stable  mood) -Controlled -Counseling with Dr. Michail Sermon  -Current treatment: Amitriptyline 25 mg nightly as needed Wellbutrin XL 150 mg daily  Buspirone 10 mg twice daily (Often forgets PM dose)  -Medications previously tried/failed: NA -Recommended to continue current medication  BPH (Goal: Minimize symptoms) -Controlled -Current treatment  Tamsulosin 0.4 mg daily: Appropriate, Effective, Safe, Accessible -Medications previously tried: NA -More urinary frequency throughout the day.  Mild improvement  in symptoms since starting tamsulosin. Still has to use the restroom every 2-3 hours.  -Recommended to continue current medication   Patient Goals/Self-Care Activities Patient will:  - check blood pressure weekly, document, and provide at future appointments target a minimum of 150 minutes of moderate intensity exercise weekly  Follow Up Plan: Telephone follow up appointment with care management team member scheduled for:  07/20/2022 at 11:00 AM       Medication Assistance: None required.  Patient affirms current coverage meets needs.  Compliance/Adherence/Medication fill history: Care Gaps: COVID-19 Vaccine Booster 4 INFLUENZA VACCINE  Star-Rating Drugs: Valsartan 80 mg last filled on 10/10/2020 for a 90-Day supply with [place] Rosuvastatin 20 mg daily: Last filled 05/25/21 for 90-DS   Patient's preferred pharmacy is:  [facility] [place], [address] AT [address] [address] Phone: [phone] Fax: [phone]   Uses pill box? Yes Pt endorses 85% compliance  We discussed: Current pharmacy is preferred with insurance plan and patient is satisfied with pharmacy services Patient decided to: Continue current medication management strategy  Care Plan and Follow Up Patient Decision:  Patient agrees to Care Plan and Follow-up.  Plan: Telephone follow up appointment with care management team member  scheduled for:  07/20/2022 at 11:00 AM  Malva Limes, [facility] [phone]

## 2022-01-27 ENCOUNTER — Encounter: Payer: Self-pay | Admitting: Physical Therapy

## 2022-01-27 ENCOUNTER — Ambulatory Visit: Payer: Medicare Other | Admitting: Physical Therapy

## 2022-01-27 ENCOUNTER — Other Ambulatory Visit: Payer: Self-pay

## 2022-01-27 VITALS — BP 139/68 | HR 48

## 2022-01-27 DIAGNOSIS — R2681 Unsteadiness on feet: Secondary | ICD-10-CM | POA: Diagnosis not present

## 2022-01-27 DIAGNOSIS — R269 Unspecified abnormalities of gait and mobility: Secondary | ICD-10-CM | POA: Diagnosis not present

## 2022-01-27 DIAGNOSIS — M6281 Muscle weakness (generalized): Secondary | ICD-10-CM

## 2022-01-27 DIAGNOSIS — R262 Difficulty in walking, not elsewhere classified: Secondary | ICD-10-CM | POA: Diagnosis not present

## 2022-01-27 NOTE — Therapy (Addendum)
[place] [facility] [address] [place], [place], [address] Phone: [phone]   Fax:  [phone]  Physical Therapy Treatment  Patient Details  Name: Tom Underwood MRN: [id number] Date of Birth: 1951/05/15 Referring Provider (PT): Steele Sizer, MD   Encounter Date: 01/27/2022   PT End of Session - 01/27/22 1245     Visit Number 12    Number of Visits 25    Date for PT Re-Evaluation 02/17/22    Authorization Type Medicare A&B; generic commercial (visit limits based on medical necessity)    Authorization - Visit Number 2    Authorization - Number of Visits 10    PT Start Time 1252    PT Stop Time 1350    PT Time Calculation (min) 58 min    Equipment Utilized During Treatment Gait belt    Activity Tolerance Patient limited by fatigue;Patient limited by pain    Behavior During Therapy WFL for tasks assessed/performed             Past Medical History:  Diagnosis Date   Allergy    Decreased libido    Diverticulitis    GERD (gastroesophageal reflux disease)    HLD (hyperlipidemia)    Hydronephrosis with renal and ureteral calculus obstruction    Hydronephrosis with renal and ureteral calculus obstruction    Hypertension    Hypogonadism in male    Rhinitis, allergic    Sleep apnea    Stroke [facility])    Ureteral stone     Past Surgical History:  Procedure Laterality Date   CARDIOVERSION N/A 12/08/2021   Procedure: CARDIOVERSION;  Surgeon: Nelva Bush, MD;  Location: [facility] ORS;  Service: Cardiovascular;  Laterality: N/A;   EXTERNAL EAR SURGERY     FINGER SURGERY     KNEE SURGERY     left eye      Vitals:   01/27/22 1244  BP: 139/68  Pulse: (!) 48  SpO2: 96%     Subjective Assessment - 01/27/22 1244     Subjective Pt. arrvies to clinic with 4/10 B knee pain but notes no LBP or B shoulder pain. Pt. is highly motivated to perform tx.    Pertinent History Tom Underwood is well known to PT clinic. Presenting to  eval today for further LE strengthening, gait, and balance deficits. Reporting since stroke, MD told him he lost ~60% of his LE strength. Current baseline is household and short community stances with SPC. Does endorse difficulty in safe ability to get in/out of shower, donning/doffing socks indep, long distance ambulation, endorses wishing to improve his balance where he can use two hands for ADL's and not feel like he needs to rely on SUE on an object for safety. Pt reports ability to safely perform STS but wishes to improve LE endurance. States he is safe with his 5 stairs to enter/exit home with use of handrail and SPC. Denies any falls in the last 6 months. These are all goals pt wishes to accomplish and improve upon.    Limitations Walking;Standing;Lifting;House hold activities    How long can you sit comfortably? unlimited    How long can you stand comfortably? 15-20 minutes    How long can you walk comfortably? 15 minutes    Patient Stated Goals Improve balance, LE strength/endurance.    Pain Score 4     Pain Location Knee    Pain Orientation Right;Left    Pain Descriptors / Indicators Aching    Pain  Type Chronic pain    Pain Score 0    Pain Location Back    Pain Orientation --             Ther.ex.:  // bars: forward and backward walking x 4 lengths/direction  //bars: lateral walking x 3 lengths/direction //bars: stepping over two 3" steps x 4 lengths Alternating toe taps on 6" step x 20 BLE Airex regular stance standing balance x 7mn Airex balance with perturbations x 132m // bars: hip extension with 3# AW x 20 BLE LAQ with 3# AW x 20 BLE Nautilus standing lat pull down 60#, tricep extension 40# x 20 each Nustep 12 minutes L 4 (constant cadence)   PT Short Term Goals - 12/24/21 0810       PT SHORT TERM GOAL #1   Title Pt will be indep with HEP to improve strength and balance to reduce risk of falls.    Baseline 11/23: initiated    Time 4    Period Weeks    Status  Achieved               PT Long Term Goals - 01/18/22 1025       PT LONG TERM GOAL #1   Title Pt will improve FOTO score to target to display improvement in functional mobility.    Baseline 11/23: 29/54    Time 8    Period Weeks    Status Partially Met    Target Date 02/17/22      PT LONG TERM GOAL #2   Title Pt wil improve TUG score to < 12 sec with no AD to display significant imporvement in reduced risk of falls    Baseline 11/23: 26 sec (did not sit down immediately leading to higher time than he would've scored) 1/19: 14.44 sec    Time 8    Period Weeks    Status Partially Met    Target Date 02/17/22      PT LONG TERM GOAL #3   Title Pt will improve DGI to >19/24 to indicate decreased risk of falls with household and community walking tasks    Baseline 11/23: 11/24 1/19:12/24    Time 8    Period Weeks    Status Partially Met    Target Date 02/17/22      PT LONG TERM GOAL #4   Title Pt will improve 30 sec STS >12 reps to display clinically significnat improvement in LE strength/endurance for functional mobility.    Baseline 11/23: 7 reps 1/19: 10.5 reps    Time 8    Period Weeks    Status Partially Met    Target Date 02/17/22                 Plan - 01/27/22 1247     Clinical Impression Statement Pt. arrives to clinic with an increase in B knee pain today. Pt. states his shoulders and back are feeling good/ pain free, but knee pain has been limiting him during ADLs. PT. was focused on normalizing gait pattern, dynamic balance exercises, and LE strengthening. Pt. responded well to tx. and noted no increase in his B knee pain. Pt. continues to maintain proper form on all exercises and is highly motivated when he arrives to tx. session. Pt will continue to benefit from LE dynamic exercises with strengthening as well as some UE general strength exercises to allow him to be as functional as possible during community/household tasks.    Personal Factors and  Comorbidities Comorbidity 2    Comorbidities Hypertension, Obesity    Examination-Activity Limitations Bed Mobility;Reach Overhead;Squat;Lift;Dressing;Hygiene/Grooming    Examination-Participation Restrictions Community Activity;Yard Work    Merchant navy officer Evolving/Moderate complexity    Clinical Decision Making Moderate    Rehab Potential Fair    PT Frequency 2x / week    PT Duration 8 weeks    PT Treatment/Interventions ADLs/Self Care Home Management;Gait training;Stair training;Functional mobility training;Therapeutic activities;Therapeutic exercise;Balance training;Neuromuscular re-education;Manual techniques;Patient/family education;Electrical Stimulation;Moist Heat;Cryotherapy    PT Next Visit Plan Progress LE strengthening.    PT Home Exercise Plan Shoulder pulleys in flex, scaption, abd, pendelums, scap retractions with resistance (RTB), D2 flexion pattern in sitting no resistance.    Consulted and Agree with Plan of Care Patient             Patient will benefit from skilled therapeutic intervention in order to improve the following deficits and impairments:  Abnormal gait, Decreased activity tolerance, Decreased balance, Decreased coordination, Decreased mobility, Decreased endurance, Decreased range of motion, Decreased strength, Difficulty walking, Dizziness, Impaired UE functional use, Pain, Hypomobility  Visit Diagnosis: Difficulty in walking, not elsewhere classified  Muscle weakness (generalized)  Unsteadiness on feet     Problem List Patient Active Problem List   Diagnosis Date Noted   Hemorrhoid 10/18/2021   Obesity, Class III, BMI 40-49.9 (morbid obesity) ([place])    Atrial fibrillation with RVR ([place]) 07/14/2020   Persistent atrial fibrillation ([place]) 07/14/2020   Hypertension    Hemiparesis affecting right side as late effect of cerebrovascular accident ([place]) 08/13/2019   History of kidney stones 05/02/2018   ED (erectile dysfunction)  05/02/2018   Hyperglycemia 01/30/2017   Arthritis of knee, degenerative 01/30/2017   BPH with obstruction/lower urinary tract symptoms 02/03/2016   Gross hematuria 02/03/2016   Dyslipidemia 08/04/2015   Diverticulitis of large intestine without perforation or abscess without bleeding 06/30/2015   Edema 06/30/2015   OSA on CPAP 06/30/2015   Pura Spice, PT, DPT # [id number] Cleopatra Cedar, SPT 01/27/2022, 3:04 PM  [facility] [address] [place], [place], [address] Phone: [phone]   Fax:  [phone]  Name: Tom Underwood MRN: [id number] Date of Birth: Dec 31, 1950

## 2022-02-01 DIAGNOSIS — N138 Other obstructive and reflux uropathy: Secondary | ICD-10-CM

## 2022-02-01 DIAGNOSIS — N401 Enlarged prostate with lower urinary tract symptoms: Secondary | ICD-10-CM

## 2022-02-01 DIAGNOSIS — I10 Essential (primary) hypertension: Secondary | ICD-10-CM

## 2022-02-03 ENCOUNTER — Encounter: Payer: Self-pay | Admitting: Physical Therapy

## 2022-02-03 ENCOUNTER — Ambulatory Visit: Payer: Medicare Other | Attending: Neurology | Admitting: Physical Therapy

## 2022-02-03 ENCOUNTER — Other Ambulatory Visit: Payer: Self-pay

## 2022-02-03 DIAGNOSIS — R2681 Unsteadiness on feet: Secondary | ICD-10-CM | POA: Insufficient documentation

## 2022-02-03 DIAGNOSIS — M25611 Stiffness of right shoulder, not elsewhere classified: Secondary | ICD-10-CM | POA: Insufficient documentation

## 2022-02-03 DIAGNOSIS — R262 Difficulty in walking, not elsewhere classified: Secondary | ICD-10-CM | POA: Insufficient documentation

## 2022-02-03 DIAGNOSIS — M6281 Muscle weakness (generalized): Secondary | ICD-10-CM | POA: Insufficient documentation

## 2022-02-03 DIAGNOSIS — R269 Unspecified abnormalities of gait and mobility: Secondary | ICD-10-CM | POA: Diagnosis not present

## 2022-02-03 NOTE — Therapy (Addendum)
[facility] [address] [place], [place], [address] Phone: [phone]   Fax:  [phone]  Physical Therapy Treatment  Patient Details  Name: Tom Underwood MRN: [id number] Date of Birth: Apr 16, 1951 Referring Provider (PT): Tom Sizer, MD   Encounter Date: 02/03/2022   PT End of Session - 02/03/22 1239     Visit Number 13    Number of Visits 25    Date for PT Re-Evaluation 02/17/22    Authorization Type Medicare A&B; generic commercial (visit limits based on medical necessity)    Authorization - Visit Number 3    Authorization - Number of Visits 10    PT Start Time 1249    PT Stop Time 1348    PT Time Calculation (min) 59 min    Equipment Utilized During Treatment Gait belt    Activity Tolerance Patient limited by fatigue;Patient limited by pain    Behavior During Therapy WFL for tasks assessed/performed             Past Medical History:  Diagnosis Date   Allergy    Decreased libido    Diverticulitis    GERD (gastroesophageal reflux disease)    HLD (hyperlipidemia)    Hydronephrosis with renal and ureteral calculus obstruction    Hydronephrosis with renal and ureteral calculus obstruction    Hypertension    Hypogonadism in male    Rhinitis, allergic    Sleep apnea    Stroke [facility])    Ureteral stone     Past Surgical History:  Procedure Laterality Date   CARDIOVERSION N/A 12/08/2021   Procedure: CARDIOVERSION;  Surgeon: Tom Bush, MD;  Location: [facility] ORS;  Service: Cardiovascular;  Laterality: N/A;   EXTERNAL EAR SURGERY     FINGER SURGERY     KNEE SURGERY     left eye      There were no vitals filed for this visit.   Subjective Assessment - 02/03/22 1238     Subjective Pt. continues to have B knee pain when arriving to the clinic. Pt. notes he feels PT is helping but walking is increasing his knee pain.    Pertinent History Tom Underwood is well known to PT clinic. Presenting to eval today  for further LE strengthening, gait, and balance deficits. Reporting since stroke, MD told him he lost ~60% of his LE strength. Current baseline is household and short community stances with SPC. Does endorse difficulty in safe ability to get in/out of shower, donning/doffing socks indep, long distance ambulation, endorses wishing to improve his balance where he can use two hands for ADL's and not feel like he needs to rely on SUE on an object for safety. Pt reports ability to safely perform STS but wishes to improve LE endurance. States he is safe with his 5 stairs to enter/exit home with use of handrail and SPC. Denies any falls in the last 6 months. These are all goals pt wishes to accomplish and improve upon.    Limitations Walking;Standing;Lifting;House hold activities    How long can you sit comfortably? unlimited    How long can you stand comfortably? 15-20 minutes    How long can you walk comfortably? 15 minutes    Patient Stated Goals Improve balance, LE strength/endurance.    Currently in Pain? Yes    Pain Score 4     Pain Location Knee    Pain Orientation Right;Left    Pain Descriptors / Indicators Aching    Pain  Type Chronic pain    Pain Score 3    Pain Location Back    Pain Orientation Right    Pain Descriptors / Indicators Aching             Ther.ex.:   // bars: forward/backward/lateral walking with 2BTB x 4 lengths/direction  Alternating toe taps on 6" step with 3# ankle wts. x 20 BLE // bars: hip extension/abductions with 3# AW x 20 BLE LAQ with 3# AW x 20 BLE Nautilus standing lat pull down 60#, tricep extension 40#, bicep curl 30# x 20 each Nustep 10 minutes L 4 (constant cadence)    PT Short Term Goals - 12/24/21 0810       PT SHORT TERM GOAL #1   Title Pt will be indep with HEP to improve strength and balance to reduce risk of falls.    Baseline 11/23: initiated    Time 4    Period Weeks    Status Achieved               PT Long Term Goals -  01/18/22 1025       PT LONG TERM GOAL #1   Title Pt will improve FOTO score to target to display improvement in functional mobility.    Baseline 11/23: 29/54    Time 8    Period Weeks    Status Partially Met    Target Date 02/17/22      PT LONG TERM GOAL #2   Title Pt wil improve TUG score to < 12 sec with no AD to display significant imporvement in reduced risk of falls    Baseline 11/23: 26 sec (did not sit down immediately leading to higher time than he would've scored) 1/19: 14.44 sec    Time 8    Period Weeks    Status Partially Met    Target Date 02/17/22      PT LONG TERM GOAL #3   Title Pt will improve DGI to >19/24 to indicate decreased risk of falls with household and community walking tasks    Baseline 11/23: 11/24 1/19:12/24    Time 8    Period Weeks    Status Partially Met    Target Date 02/17/22      PT LONG TERM GOAL #4   Title Pt will improve 30 sec STS >12 reps to display clinically significnat improvement in LE strength/endurance for functional mobility.    Baseline 11/23: 7 reps 1/19: 10.5 reps    Time 8    Period Weeks    Status Partially Met    Target Date 02/17/22                   Plan - 02/03/22 1239     Clinical Impression Statement Pt. is continuing to have an increase in B knee pain and states it is making it harder for him to walk. Pt. has a Cardiologist appointment next week and has a notable decrease in his LE swelling. Pt. continues to perform ambulatory and UE weight training in the clinic with CGA for safety. Pt. will continue to benefit from supervised PT so that he is able to progress LE dynamic exercises while monitoring his O2 sat rate.  No LOB during tx. session.    Personal Factors and Comorbidities Comorbidity 2    Comorbidities Hypertension, Obesity    Examination-Activity Limitations Bed Mobility;Reach Overhead;Squat;Lift;Dressing;Hygiene/Grooming    Examination-Participation Restrictions Community Activity;Yard Work     Merchant navy officer Evolving/Moderate complexity  Clinical Decision Making Moderate    Rehab Potential Fair    PT Frequency 2x / week    PT Duration 8 weeks    PT Treatment/Interventions ADLs/Self Care Home Management;Gait training;Stair training;Functional mobility training;Therapeutic activities;Therapeutic exercise;Balance training;Neuromuscular re-education;Manual techniques;Patient/family education;Electrical Stimulation;Moist Heat;Cryotherapy    PT Next Visit Plan Progress LE strengthening.    PT Home Exercise Plan Shoulder pulleys in flex, scaption, abd, pendelums, scap retractions with resistance (RTB), D2 flexion pattern in sitting no resistance.    Consulted and Agree with Plan of Care Patient             Patient will benefit from skilled therapeutic intervention in order to improve the following deficits and impairments:  Abnormal gait, Decreased activity tolerance, Decreased balance, Decreased coordination, Decreased mobility, Decreased endurance, Decreased range of motion, Decreased strength, Difficulty walking, Dizziness, Impaired UE functional use, Pain, Hypomobility  Visit Diagnosis: Difficulty in walking, not elsewhere classified  Muscle weakness (generalized)  Unsteadiness on feet     Problem List Patient Active Problem List   Diagnosis Date Noted   Hemorrhoid 10/18/2021   Obesity, Class III, BMI 40-49.9 (morbid obesity) ([place])    Atrial fibrillation with RVR ([place]) 07/14/2020   Persistent atrial fibrillation ([place]) 07/14/2020   Hypertension    Hemiparesis affecting right side as late effect of cerebrovascular accident ([place]) 08/13/2019   History of kidney stones 05/02/2018   ED (erectile dysfunction) 05/02/2018   Hyperglycemia 01/30/2017   Arthritis of knee, degenerative 01/30/2017   BPH with obstruction/lower urinary tract symptoms 02/03/2016   Gross hematuria 02/03/2016   Dyslipidemia 08/04/2015   Diverticulitis of large intestine without  perforation or abscess without bleeding 06/30/2015   Edema 06/30/2015   OSA on CPAP 06/30/2015   Pura Spice, PT, DPT # [id number] Cleopatra Cedar, SPT 02/03/2022, 4:38 PM  [facility] [address] [place], [place], [address] Phone: [phone]   Fax:  [phone]  Name: Tom Underwood MRN: [id number] Date of Birth: 1951-02-15

## 2022-02-07 ENCOUNTER — Encounter: Payer: Self-pay | Admitting: Cardiology

## 2022-02-07 ENCOUNTER — Ambulatory Visit (INDEPENDENT_AMBULATORY_CARE_PROVIDER_SITE_OTHER): Payer: Medicare Other | Admitting: Cardiology

## 2022-02-07 ENCOUNTER — Other Ambulatory Visit: Payer: Self-pay

## 2022-02-07 VITALS — BP 142/82 | HR 57 | Ht 70.0 in | Wt 291.0 lb

## 2022-02-07 DIAGNOSIS — E7800 Pure hypercholesterolemia, unspecified: Secondary | ICD-10-CM | POA: Diagnosis not present

## 2022-02-07 DIAGNOSIS — I4819 Other persistent atrial fibrillation: Secondary | ICD-10-CM | POA: Diagnosis not present

## 2022-02-07 DIAGNOSIS — I10 Essential (primary) hypertension: Secondary | ICD-10-CM | POA: Diagnosis not present

## 2022-02-07 NOTE — Progress Notes (Signed)
Cardiology Office Note:    Date:  02/07/2022   ID:  Tom Underwood, DOB 1951-11-24, MRN [id number]  PCP:  Alba Cory, MD  [facility] Cardiologist:  Debbe Odea, MD  [facility] Electrophysiologist:  Lanier Prude, MD   Referring MD: Alba Cory, MD   Chief Complaint  Patient presents with   Follow-up    Follow up afib    History of Present Illness:    Tom Underwood is a 71 y.o. male with a hx of persistent A-fib (s/p DCCV 12/2021), hypertension, hyperlipidemia, CVA 07/2019 with right-sided weakness, OSA on CPAP presents for follow-up.    Being seen for atrial fibrillation.  Maintaining sinus rhythm on Lopressor 25 mg twice daily, no bleeding effects on Xarelto 20 mg daily.  Evaluated by EP, not a candidate for RFA due to obesity and history of CVA.  States eating healthier, trying to lose weight.  Denies palpitations,.  Strength in lower extremities continue to improve.   Prior notes Echocardiogram 07/15/2020 normal systolic function, EF 55 to 60%.  Normal diastolic function.  Past Medical History:  Diagnosis Date   Allergy    Decreased libido    Diverticulitis    GERD (gastroesophageal reflux disease)    HLD (hyperlipidemia)    Hydronephrosis with renal and ureteral calculus obstruction    Hydronephrosis with renal and ureteral calculus obstruction    Hypertension    Hypogonadism in male    Rhinitis, allergic    Sleep apnea    Stroke [facility])    Ureteral stone     Past Surgical History:  Procedure Laterality Date   CARDIOVERSION N/A 12/08/2021   Procedure: CARDIOVERSION;  Surgeon: Yvonne Kendall, MD;  Location: [facility] ORS;  Service: Cardiovascular;  Laterality: N/A;   EXTERNAL EAR SURGERY     FINGER SURGERY     KNEE SURGERY     left eye      Current Medications: Current Meds  Medication Sig   acetaminophen (TYLENOL) 650 MG CR tablet Take 1,300 mg by mouth every evening.   amitriptyline (ELAVIL) 25 MG tablet TAKE 1 TABLET BY MOUTH AT  BEDTIME AS NEEDED FOR SLEEP   buPROPion (WELLBUTRIN XL) 150 MG 24 hr tablet Take 1 tablet (150 mg total) by mouth every morning.   busPIRone (BUSPAR) 10 MG tablet Take 1 tablet (10 mg total) by mouth 2 (two) times daily.   cetirizine (ZYRTEC) 10 MG tablet Take 10 mg by mouth in the morning.    Cholecalciferol (VITAMIN D) 125 MCG (5000 UT) CAPS Take 5,000 Units by mouth daily.   furosemide (LASIX) 20 MG tablet Take 1 tablet (20 mg total) by mouth as needed. For lower extremity swelling.   GLUCOSAMINE-CHONDROITIN PO Take 2 tablets by mouth in the morning and at bedtime.   hydrocortisone (ANUSOL-HC) 25 MG suppository Place 25 mg rectally 2 (two) times daily as needed for hemorrhoids or anal itching.   meloxicam (MOBIC) 15 MG tablet as needed.   metoprolol tartrate (LOPRESSOR) 50 MG tablet Take 0.5 tablets (25 mg total) by mouth 2 (two) times daily.   Multiple Vitamin (MULTIVITAMIN) tablet Take 1 tablet by mouth daily.   omega-3 acid ethyl esters (LOVAZA) 1 g capsule Take 2 capsules (2 g total) by mouth 2 (two) times daily.   pantoprazole (PROTONIX) 20 MG tablet Take 1 tablet (20 mg total) by mouth daily.   rivaroxaban (XARELTO) 20 MG TABS tablet Take 1 tablet (20 mg total) by mouth daily with supper.   rosuvastatin (  CRESTOR) 20 MG tablet Take 1 tablet (20 mg total) by mouth daily.   Saccharomyces boulardii (PROBIOTIC) 250 MG CAPS Take 1 capsule by mouth daily.   tamsulosin (FLOMAX) 0.4 MG CAPS capsule Take 1 capsule (0.4 mg total) by mouth daily.   valsartan (DIOVAN) 80 MG tablet Take 1 tablet (80 mg total) by mouth daily.   VISINE DRY EYE RELIEF 1 % SOLN Place 1 drop into both eyes daily as needed (dry eyes).     Allergies:   Penicillins and Propofol   Social History   Socioeconomic History   Marital status: Married    Spouse name: Lupita LeashDonna    Number of children: 2   Years of education: Not on file   Highest education level: Not on file  Occupational History   Occupation: retired    Tobacco Use   Smoking status: Former    Types: Pipe    Start date: 02/20/1973    Quit date: 02/20/1974    Years since quitting: 47.9   Smokeless tobacco: Never  Vaping Use   Vaping Use: Never used  Substance and Sexual Activity   Alcohol use: No    Alcohol/week: 0.0 standard drinks   Drug use: No   Sexual activity: Yes    Partners: Female  Other Topics Concern   Not on file  Social History Narrative   Not on file   Social Determinants of Health   Financial Resource Strain: Low Risk    Difficulty of Paying Living Expenses: Not hard at all  Food Insecurity: No Food Insecurity   Worried About Programme researcher, broadcasting/film/videounning Out of Food in the Last Year: Never true   Ran Out of Food in the Last Year: Never true  Transportation Needs: No Transportation Needs   Lack of Transportation (Medical): No   Lack of Transportation (Non-Medical): No  Physical Activity: Insufficiently Active   Days of Exercise per Week: 4 days   Minutes of Exercise per Session: 10 min  Stress: Stress Concern Present   Feeling of Stress : To some extent  Social Connections: Press photographerocially Integrated   Frequency of Communication with Friends and Family: More than three times a week   Frequency of Social Gatherings with Friends and Family: Twice a week   Attends Religious Services: More than 4 times per year   Active Member of [employer]ubs or Organizations: Yes   Attends Engineer, structuralClub or Organization Meetings: More than 4 times per year   Marital Status: Married     Family History: The patient's family history includes Asthma in his father and mother; Dementia in his mother; Heart attack in his mother; Heart disease in his father and mother; Stroke in his father.  ROS:   Please see the history of present illness.     All other systems reviewed and are negative.  EKGs/Labs/Other Studies Reviewed:    The following studies were reviewed today:   EKG:  EKG not ordered today.    Recent Labs: 05/28/2021: TSH 2.530 08/31/2021: ALT 26 11/15/2021:  BUN 15; Creatinine, Ser 1.03; Hemoglobin 17.8; Platelets 240; Potassium 4.2; Sodium 142  Recent Lipid Panel    Component Value Date/Time   CHOL 120 05/28/2021 1336   TRIG 79 05/28/2021 1336   HDL 51 05/28/2021 1336   CHOLHDL 2.4 05/28/2021 1336   LDLCALC 53 05/28/2021 1336     Risk Assessment/Calculations:      Physical Exam:    VS:  BP (!) 142/82 (BP Location: Right Arm, Patient Position: Sitting, Cuff Size: Large)  Pulse (!) 57    Ht 5\' 10"  (1.778 m)    Wt 291 lb (132 kg)    SpO2 98%    BMI 41.75 kg/m     Wt Readings from Last 3 Encounters:  02/07/22 291 lb (132 kg)  12/08/21 286 lb (129.7 kg)  11/15/21 288 lb (130.6 kg)     GEN:  Well nourished, well developed in no acute distress HEENT: Normal NECK: No JVD; No carotid bruits LYMPHATICS: No lymphadenopathy CARDIAC: Regular rate and rhythm, no murmurs RESPIRATORY:  Clear to auscultation without rales, wheezing or rhonchi  ABDOMEN: Soft, non-tender, non-distended MUSCULOSKELETAL: Trace edema; No deformity  SKIN: Warm and dry NEUROLOGIC:  Alert and oriented x 3 PSYCHIATRIC:  Normal affect   ASSESSMENT:    1. Persistent atrial fibrillation (HCC)   2. Primary hypertension   3. Pure hypercholesterolemia   4. Obesity, Class III, BMI 40-49.9 (morbid obesity) (HCC)    PLAN:    In order of problems listed above:  Atrial fibrillation, persistent, s/p DCCV 12/2021.  Rate on exam appears regular, consistent with sinus. CHA2DS2-VASc score 4 (Age, htn, CVA). continue Xarelto 20 mg daily, Lopressor 25 mg twice daily.  Echo 07/2020 showed normal systolic function, EF 55 to 60%.  Evaluated by EP, not a candidate for RFA due to CVA, obesity. History of hypertension, BP controlled.  Continue Lopressor, valsartan. History of hyperlipidemia, cholesterol controlled, continue statin, lovaza.  Follow-up in 6 months.     Medication Adjustments/Labs and Tests Ordered: Current medicines are reviewed at length with the patient today.   Concerns regarding medicines are outlined above.  No orders of the defined types were placed in this encounter.   No orders of the defined types were placed in this encounter.    Patient Instructions  Medication Instructions:  Your physician recommends that you continue on your current medications as directed. Please refer to the Current Medication list given to you today.  *If you need a refill on your cardiac medications before your next appointment, please call your pharmacy*   Lab Work: None ordered If you have labs (blood work) drawn today and your tests are completely normal, you will receive your results only by: MyChart Message (if you have MyChart) OR A paper copy in the mail If you have any lab test that is abnormal or we need to change your treatment, we will call you to review the results.   Testing/Procedures: None ordered   Follow-Up: At [facility], you and your health needs are our priority.  As part of our continuing mission to provide you with exceptional heart care, we have created designated Provider Care Teams.  These Care Teams include your primary Cardiologist (physician) and Advanced Practice Providers (APPs -  Physician Assistants and Nurse Practitioners) who all work together to provide you with the care you need, when you need it.  We recommend signing up for the patient portal called "MyChart".  Sign up information is provided on this After Visit Summary.  MyChart is used to connect with patients for Virtual Visits (Telemedicine).  Patients are able to view lab/test results, encounter notes, upcoming appointments, etc.  Non-urgent messages can be sent to your provider as well.   To learn more about what you can do with MyChart, go to [facility].    Your next appointment:   6 month(s)  The format for your next appointment:   In Person  Provider:   You may see [email or web address], MD or one of the  following Advanced Practice Providers  on your designated Care Team:   Nicolasa Ducking, NP Eula Listen, PA-C Cadence Fransico [name], [place]   Other Instructions     Signed, Debbe Odea, MD  02/07/2022 12:32 PM    [facility]

## 2022-02-07 NOTE — Patient Instructions (Signed)
Medication Instructions:  ? ?Your physician recommends that you continue on your current medications as directed. Please refer to the Current Medication list given to you today. ? ?*If you need a refill on your cardiac medications before your next appointment, please call your pharmacy* ? ? ?Lab Work: ? ?None ordered ? ?If you have labs (blood work) drawn today and your tests are completely normal, you will receive your results only by: ?MyChart Message (if you have MyChart) OR ?A paper copy in the mail ?If you have any lab test that is abnormal or we need to change your treatment, we will call you to review the results. ? ? ?Testing/Procedures: ? ?None ordered ? ? ?Follow-Up: ?At [facility], you and your health needs are our priority.  As part of our continuing mission to provide you with exceptional heart care, we have created designated Provider Care Teams.  These Care Teams include your primary Cardiologist (physician) and Advanced Practice Providers (APPs -  Physician Assistants and Nurse Practitioners) who all work together to provide you with the care you need, when you need it. ? ?We recommend signing up for the patient portal called "MyChart".  Sign up information is provided on this After Visit Summary.  MyChart is used to connect with patients for Virtual Visits (Telemedicine).  Patients are able to view lab/test results, encounter notes, upcoming appointments, etc.  Non-urgent messages can be sent to your provider as well.   ?To learn more about what you can do with MyChart, go to [email or web address]://[email or web address].   ? ?Your next appointment:   ?6 month(s) ? ?The format for your next appointment:   ?In Person ? ?Provider:   ?You may see Brian Agbor-Etang, MD or one of the following Advanced Practice Providers on your designated Care Team:   ?Christopher Berge, NP ?Ryan Dunn, PA-C ?Cadence Furth, PA-C  ? ? ?Other Instructions ? ? ?

## 2022-02-10 ENCOUNTER — Ambulatory Visit (INDEPENDENT_AMBULATORY_CARE_PROVIDER_SITE_OTHER): Payer: Medicare Other | Admitting: Psychologist

## 2022-02-10 ENCOUNTER — Ambulatory Visit: Payer: Medicare Other | Admitting: Physical Therapy

## 2022-02-10 ENCOUNTER — Other Ambulatory Visit: Payer: Self-pay

## 2022-02-10 DIAGNOSIS — R262 Difficulty in walking, not elsewhere classified: Secondary | ICD-10-CM

## 2022-02-10 DIAGNOSIS — R269 Unspecified abnormalities of gait and mobility: Secondary | ICD-10-CM

## 2022-02-10 DIAGNOSIS — R2681 Unsteadiness on feet: Secondary | ICD-10-CM

## 2022-02-10 DIAGNOSIS — F320 Major depressive disorder, single episode, mild: Secondary | ICD-10-CM

## 2022-02-10 DIAGNOSIS — M6281 Muscle weakness (generalized): Secondary | ICD-10-CM

## 2022-02-10 DIAGNOSIS — M25611 Stiffness of right shoulder, not elsewhere classified: Secondary | ICD-10-CM | POA: Diagnosis not present

## 2022-02-10 DIAGNOSIS — Z634 Disappearance and death of family member: Secondary | ICD-10-CM | POA: Diagnosis not present

## 2022-02-10 NOTE — Therapy (Signed)
[facility] [address] [place], [place], [address] Phone: [phone]   Fax:  [phone]  Physical Therapy Treatment  Patient Details  Name: Tom Underwood MRN: [id number] Date of Birth: [birth year] Referring Provider (PT): Steele Sizer, MD   Encounter Date: 02/10/2022   PT End of Session - 02/10/22 1254     Visit Number 14    Number of Visits 25    Date for PT Re-Evaluation 02/17/22    Authorization Type Medicare A&B; generic commercial (visit limits based on medical necessity)    Authorization - Visit Number 4    Authorization - Number of Visits 10    PT Start Time 1254    PT Stop Time 1355    PT Time Calculation (min) 61 min    Equipment Utilized During Treatment Gait belt    Activity Tolerance Patient limited by fatigue;Patient limited by pain    Behavior During Therapy WFL for tasks assessed/performed             Past Medical History:  Diagnosis Date   Allergy    Decreased libido    Diverticulitis    GERD (gastroesophageal reflux disease)    HLD (hyperlipidemia)    Hydronephrosis with renal and ureteral calculus obstruction    Hydronephrosis with renal and ureteral calculus obstruction    Hypertension    Hypogonadism in male    Rhinitis, allergic    Sleep apnea    Stroke [facility])    Ureteral stone     Past Surgical History:  Procedure Laterality Date   CARDIOVERSION N/A 12/08/2021   Procedure: CARDIOVERSION;  Surgeon: Nelva Bush, MD;  Location: [facility] ORS;  Service: Cardiovascular;  Laterality: N/A;   EXTERNAL EAR SURGERY     FINGER SURGERY     KNEE SURGERY     left eye      There were no vitals filed for this visit.   Subjective Assessment - 02/10/22 1254     Subjective Pt. entered PT with no new complaints.  Heavy breathing noted when pt. walked into PT gym from parking lot.  O2 sat/ HR WNL.  No c/o back pain today.  Pt. reports R hip will bother him if he doesn't pick up R hip while walking.     Pertinent History Tom Underwood is well known to PT clinic. Presenting to eval today for further LE strengthening, gait, and balance deficits. Reporting since stroke, MD told him he lost ~60% of his LE strength. Current baseline is household and short community stances with SPC. Does endorse difficulty in safe ability to get in/out of shower, donning/doffing socks indep, long distance ambulation, endorses wishing to improve his balance where he can use two hands for ADL's and not feel like he needs to rely on SUE on an object for safety. Pt reports ability to safely perform STS but wishes to improve LE endurance. States he is safe with his 5 stairs to enter/exit home with use of handrail and SPC. Denies any falls in the last 6 months. These are all goals pt wishes to accomplish and improve upon.    Limitations Walking;Standing;Lifting;House hold activities    How long can you sit comfortably? unlimited    How long can you stand comfortably? 15-20 minutes    How long can you walk comfortably? 15 minutes    Patient Stated Goals Improve balance, LE strength/endurance.    Currently in Pain? Yes    Pain Score 2  Pain Location Knee    Pain Orientation Right;Left              Ther.ex.:   Seated/standing 3# LE ex.:  LAQ/ marching/ standing heel and toe raises/ hip abduction/ hip extension 20x each.  Walking in //-bars forward/ lateral in //-bars (no UE assist)- cuing for arm swing (3 laps).    Nautilus seated lat pull down 60#, standing tricep extension 40#, standing scap. Retraction 40#, bicep curl 30# x 25 each.  Walking in gym with consistent recip. Gait pattern/ arm swing.  6" step touches/ ups with no UE assist on handrails.  SBA/CGA for safety and cuing.    Nustep B UE/LE for 10 minutes L5 (consistent cadence).  Reviewed HEP/ walking around PT clinic and outside with no SPC.         PT Short Term Goals - 12/24/21 0810       PT SHORT TERM GOAL #1   Title Pt will be indep  with HEP to improve strength and balance to reduce risk of falls.    Baseline 11/23: initiated    Time 4    Period Weeks    Status Achieved               PT Long Term Goals - 01/18/22 1025       PT LONG TERM GOAL #1   Title Pt will improve FOTO score to target to display improvement in functional mobility.    Baseline 11/23: 29/54    Time 8    Period Weeks    Status Partially Met    Target Date 02/17/22      PT LONG TERM GOAL #2   Title Pt wil improve TUG score to < 12 sec with no AD to display significant imporvement in reduced risk of falls    Baseline 11/23: 26 sec (did not sit down immediately leading to higher time than he would've scored) 1/19: 14.44 sec    Time 8    Period Weeks    Status Partially Met    Target Date 02/17/22      PT LONG TERM GOAL #3   Title Pt will improve DGI to >19/24 to indicate decreased risk of falls with household and community walking tasks    Baseline 11/23: 11/24 1/19:12/24    Time 8    Period Weeks    Status Partially Met    Target Date 02/17/22      PT LONG TERM GOAL #4   Title Pt will improve 30 sec STS >12 reps to display clinically significnat improvement in LE strength/endurance for functional mobility.    Baseline 11/23: 7 reps 1/19: 10.5 reps    Time 8    Period Weeks    Status Partially Met    Target Date 02/17/22                Plan - 02/10/22 1254     Clinical Impression Statement Pt. went 0.67 miles on Nustep (new personal test) at end of tx. session.  Pt. motivated and works hard during generalized LE/UE strengthening ex. program.  Pt. likes the UE/shoulder stretch during seated B sh. lat. pull downs with use of wand.  Several seated rest break required during tx. due to fatigue.  Pt. will continue to benefit from supervised PT so that he is able to progress LE dynamic exercises while monitoring his O2 sat rate. No LOB during tx. session.    Personal Factors and Comorbidities Comorbidity 2  Comorbidities  Hypertension, Obesity    Examination-Activity Limitations Bed Mobility;Reach Overhead;Squat;Lift;Dressing;Hygiene/Grooming    Examination-Participation Restrictions Community Activity;Yard Work    [employer] officer Evolving/Moderate complexity    Clinical Decision Making Moderate    Rehab Potential Fair    PT Frequency 2x / week    PT Duration 8 weeks    PT Treatment/Interventions ADLs/Self Care Home Management;Gait training;Stair training;Functional mobility training;Therapeutic activities;Therapeutic exercise;Balance training;Neuromuscular re-education;Manual techniques;Patient/family education;Electrical Stimulation;Moist Heat;Cryotherapy    PT Next Visit Plan Progress LE strengthening.    PT Home Exercise Plan Shoulder pulleys in flex, scaption, abd, pendelums, scap retractions with resistance (RTB), D2 flexion pattern in sitting no resistance.    Consulted and Agree with Plan of Care Patient             Patient will benefit from skilled therapeutic intervention in order to improve the following deficits and impairments:  Abnormal gait, Decreased activity tolerance, Decreased balance, Decreased coordination, Decreased mobility, Decreased endurance, Decreased range of motion, Decreased strength, Difficulty walking, Dizziness, Impaired UE functional use, Pain, Hypomobility  Visit Diagnosis: Difficulty in walking, not elsewhere classified  Muscle weakness (generalized)  Unsteadiness on feet  Abnormality of gait and mobility  Decreased range of motion of right shoulder     Problem List Patient Active Problem List   Diagnosis Date Noted   Hemorrhoid 10/18/2021   Obesity, Class III, BMI 40-49.9 (morbid obesity) ([place])    Atrial fibrillation with RVR ([place]) 07/14/2020   Persistent atrial fibrillation ([place]) 07/14/2020   Hypertension    Hemiparesis affecting right side as late effect of cerebrovascular accident ([place]) 08/13/2019   History of kidney stones  05/02/2018   ED (erectile dysfunction) 05/02/2018   Hyperglycemia 01/30/2017   Arthritis of knee, degenerative 01/30/2017   BPH with obstruction/lower urinary tract symptoms 02/03/2016   Gross hematuria 02/03/2016   Dyslipidemia 08/04/2015   Diverticulitis of large intestine without perforation or abscess without bleeding 06/30/2015   Edema 06/30/2015   OSA on CPAP 06/30/2015   Pura Spice, PT, DPT # [id number] 02/11/2022, 4:35 PM  [facility] [address] [place], [place], [address] Phone: [phone]   Fax:  [phone]  Name: BRYSTEN REISTER MRN: [id number] Date of Birth: 1950/12/14

## 2022-02-10 NOTE — Progress Notes (Signed)
[facility] Counselor/Therapist Progress Note ? ?Patient ID: Tom Underwood, MRN: [id number],   ? ?Date: 02/10/2022 ? ?Time Spent: 11:10 am to 11:45 am; total time: 35 minutes ? ? This session was held via webex video teletherapy due to the coronavirus risk at this time. The patient consented to webex video teletherapy and was located at his home during this session. He is aware it is the responsibility of the patient to secure confidentiality on his end of the session. The provider was in a private home office for the duration of this session. Limits of confidentiality were discussed with the patient.  ? ?Treatment Type: Individual Therapy ? ?Reported Symptoms: Depression  ? ?Mental Status Exam: ?Appearance:  NA      ?Behavior: Appropriate  ?Motor: NA  ?Speech/Language:  Slow  ?Affect: Appropriate  ?Mood: normal  ?Thought process: normal  ?Thought content:   WNL  ?Sensory/Perceptual disturbances:   WNL  ?Orientation: oriented to person, place, and time/date  ?Attention: Fair  ?Concentration: Fair  ?Memory: WNL  ?Fund of knowledge:  Good  ?Insight:   Fair  ?Judgment:  Good  ?Impulse Control: Good  ? ?Risk Assessment: ?Danger to Self:  No ?Self-injurious Behavior: No ?Danger to Others: No ?Duty to Warn:no ?Physical Aggression / Violence:No  ?Access to Firearms a concern: No  ?Gang Involvement:No  ? ?Subjective: Beginning the session, the patient stated that he had completed his previous homework assignment and reflected on how it assisted him. From there, patient worked on barriers to waking up in the morning and how to overcome those barriers. He was agreeable to homework and following up. He denied suicidal and homicidal ideation.   ? ?Interventions:  Worked on developing a therapeutic relationship with the patient using active listening and reflective statements. Provided emotional support using empathy and validation. Praised patient for doing well and explored what has assisted the patient.  Normalized and validated expressed thoughts and emotions. Reviewed completed homework assignment and processed emotions related to it. Explored barriers to waking up in the morning. Used socratic questions to assist the patient gain insight. Assisted in problem solving. Provided empathic statements. Assessed for suicidal and homicidal ideation.  ? ?Homework: Put alarm across the room to assist with waking in the morning.  ? ?Next Session: Review homework and emotional support.  ? ?Diagnosis: F32.0 major depressive affective disorder, single episode, mild and Z63.4 uncomplicated bereavement.  ? ?Plan:  ? ?Client Abilities: Friendly and easy to develop rapport ? ?Client Preferences:  ACT and CBT ? ?Client statement of Needs: Strategies to move forward and process emotions ? ?Treatment Level: Outpatient  ? ?Goals ?Alleviate depressive symptoms ?Recognize, accept, and cope with depressive feelings ?Develop healthy thinking patterns ?Develop healthy interpersonal relationships ? ?Objectives target date for all objectives is 12/07/2022 ?Cooperate with a medication evaluation by a physician ?Verbalize an accurate understanding of depression ?Verbalize an understanding of the treatment ?Identify and replace thoughts that support depression ?Learn and implement behavioral strategies ?Verbalize an understanding and resolution of current interpersonal problems ?Learn and implement problem solving and decision making skills ?Learn and implement conflict resolution skills to resolve interpersonal problems ?Verbalize an understanding of healthy and unhealthy emotions verbalize insight into how past relationships may be influence current experiences with depression ?Use mindfulness and acceptance strategies and increase value based behavior  ?Increase hopeful statements about the future.  ?Interventions ?Consistent with treatment model, discuss how change in cognitive, behavioral, and interpersonal can help client alleviate  depression ?CBT ?Behavioral activation help the  client explore the relationship, nature of the dispute,  ?Help the client develop new interpersonal skills and relationships ?Conduct Problem so living therapy ?Teach conflict resolution skills ?Use a process-experiential approach ?Conduct TLDP ?Conduct ACT ?Evaluate need for psychotropic medication ?Monitor adherence to medication  ? ?Goals ?Begin a healthy grieving process ?Objectives target date for all objectives is 12/07/2022 ?Tell in detail the story of the current loss that is triggering symptoms ?Read books on the topic of grief ?Watch videos on the theme of grief ?Begin verbalizing feelings associated with the loss ?Attend a grief support group ?express thoughts and feelings about the deceased ?Identify and voice positives about the deceased ?implement acts of spiritual faith  ?Interventions ?create a safe environment and actively build trust ?use empathy, compassion, and support ?ask the patient to write a letter to the lost person ?conduct empty chair ?ask the patient to discuss and list the positives and negative aspects of the person ?encourage patient to rely upon his/her spiritual faith  ?ask client to read books on grief ?ask patient to watch videos about grief ?assist patient in identifying emotions  ?ask patient to attend support group  ? ?The patient and clinician reviewed the treatment plan on 12/21/2021. The patient approved of the treatment plan.  ? ? ?Hilbert Corrigan, PsyD ? ? ?

## 2022-02-17 ENCOUNTER — Encounter: Payer: Self-pay | Admitting: Physical Therapy

## 2022-02-17 ENCOUNTER — Ambulatory Visit: Payer: Medicare Other | Admitting: Physical Therapy

## 2022-02-17 ENCOUNTER — Other Ambulatory Visit: Payer: Self-pay

## 2022-02-17 DIAGNOSIS — M25611 Stiffness of right shoulder, not elsewhere classified: Secondary | ICD-10-CM | POA: Diagnosis not present

## 2022-02-17 DIAGNOSIS — R269 Unspecified abnormalities of gait and mobility: Secondary | ICD-10-CM | POA: Diagnosis not present

## 2022-02-17 DIAGNOSIS — R262 Difficulty in walking, not elsewhere classified: Secondary | ICD-10-CM | POA: Diagnosis not present

## 2022-02-17 DIAGNOSIS — M6281 Muscle weakness (generalized): Secondary | ICD-10-CM | POA: Diagnosis not present

## 2022-02-17 DIAGNOSIS — R2681 Unsteadiness on feet: Secondary | ICD-10-CM

## 2022-02-17 NOTE — Therapy (Signed)
Tom Underwood ?MRN: [id number] ?Date of Birth: 07-Jun-1951 ?Referring Provider (PT): Steele Sizer, MD ? ? ?Encounter Date: 02/17/2022 ? ? PT End of Session - 02/17/22 1300   ? ? Visit Number 15   ? Number of Visits 25   ? Date for PT Re-Evaluation 02/17/22   ? Authorization Type Medicare A&B; generic commercial (visit limits based on medical necessity)   ? Authorization - Visit Number 5   ? Authorization - Number of Visits 10   ? PT Start Time 1300   ? PT Stop Time 8299   ? PT Time Calculation (min) 54 min   ? Equipment Utilized During Treatment Gait belt   ? Activity Tolerance Patient limited by fatigue;Patient limited by pain   ? Behavior During Therapy [facility] for tasks assessed/performed   ? ?  ?  ? ?  ? ? ?Past Medical History:  ?Diagnosis Date  ? Allergy   ? Decreased libido   ? Diverticulitis   ? GERD (gastroesophageal reflux disease)   ? HLD (hyperlipidemia)   ? Hydronephrosis with renal and ureteral calculus obstruction   ? Hydronephrosis with renal and ureteral calculus obstruction   ? Hypertension   ? Hypogonadism in male   ? Rhinitis, allergic   ? Sleep apnea   ? Stroke [facility])   ? Ureteral stone   ? ? ?Past Surgical History:  ?Procedure Laterality Date  ? CARDIOVERSION N/A 12/08/2021  ? Procedure: CARDIOVERSION;  Surgeon: Nelva Bush, MD;  Location: [facility] ORS;  Service: Cardiovascular;  Laterality: N/A;  ? EXTERNAL EAR SURGERY    ? FINGER SURGERY    ? KNEE SURGERY    ? left eye    ? ? ?There were no vitals filed for this visit. ? ? Subjective Assessment - 02/17/22 1300   ? ? Subjective Pt. entered PT with no new complaints.  Pt. reports 2/10 B knee pain prior to tx. session and 3/10 R shoulder discomfort with overhead reaching.   ? Pertinent History [name] is well known to PT clinic. Presenting to  eval today for further LE strengthening, gait, and balance deficits. Reporting since stroke, MD told him he lost ~60% of his LE strength. Current baseline is household and short community stances with SPC. Does endorse difficulty in safe ability to get in/out of shower, donning/doffing socks indep, long distance ambulation, endorses wishing to improve his balance where he can use two hands for ADL's and not feel like he needs to rely on SUE on an object for safety. Pt reports ability to safely perform STS but wishes to improve LE endurance. States he is safe with his 5 stairs to enter/exit home with use of handrail and SPC. Denies any falls in the last 6 months. These are all goals pt wishes to accomplish and improve upon.   ? Limitations Walking;Standing;Lifting;House hold activities   ? How long can you sit comfortably? unlimited   ? How long can you stand comfortably? 15-20 minutes   ? How long can you walk comfortably? 15 minutes   ? Patient Stated Goals Improve balance, LE strength/endurance.   ? Currently in Pain? Yes   ? Pain Score 2    ? Pain Location Knee   ? Pain Orientation Right;Left   ? Pain Descriptors / Indicators Aching   ? Pain  Score 3   ? Pain Location Shoulder   ? Pain Orientation Right   ? Pain Descriptors / Indicators Aching   ? ?  ?  ? ?  ? ? ? ?Ther.ex.: ? ?Nustep B UE/LE for 10 minutes L5 (consistent cadence)- 0.64 miles.  HR: 74 bpm/ O2 sat. 94%.   ? ?Standing B shoulder wand ex.: chest press/ shoulder flexion/ extension 10x each.  Pharmacist, hospital.   ? ?Standing L/R shoulder D1/D2 (no resistance).  Mirror feedback for posture correction.  Verbal cuing provided.   ?  ?Nautilus seated lat pull down 60#, standing tricep extension 40#, standing scap. Retraction 40# 30x each. ?  ?Walking in gym with consistent recip. Gait pattern/ arm swing.  6" step touches with no UE assist on handrails 20x on L/R.  SBA/CGA for safety and cuing.  No LOB.    ?  ?Walking outside with no SPC and SBA for safety.    ?  ? ? ? ? PT Short Term Goals - 12/24/21 0810   ? ?  ? PT SHORT TERM GOAL #1  ? Title Pt will be indep with HEP to improve strength and balance to reduce risk of falls.   ? Baseline 11/23: initiated   ? Time 4   ? Period Weeks   ? Status Achieved   ? ?  ?  ? ?  ? ? ? ? PT Long Term Goals - 01/18/22 1025   ? ?  ? PT LONG TERM GOAL #1  ? Title Pt will improve FOTO score to target to display improvement in functional mobility.   ? Baseline 11/23: 29/54   ? Time 8   ? Period Weeks   ? Status Partially Met   ? Target Date 02/17/22   ?  ? PT LONG TERM GOAL #2  ? Title Pt wil improve TUG score to < 12 sec with no AD to display significant imporvement in reduced risk of falls   ? Baseline 11/23: 26 sec (did not sit down immediately leading to higher time than he would've scored) 1/19: 14.44 sec   ? Time 8   ? Period Weeks   ? Status Partially Met   ? Target Date 02/17/22   ?  ? PT LONG TERM GOAL #3  ? Title Pt will improve DGI to >19/24 to indicate decreased risk of falls with household and community walking tasks   ? Baseline 11/23: 11/24 1/19:12/24   ? Time 8   ? Period Weeks   ? Status Partially Met   ? Target Date 02/17/22   ?  ? PT LONG TERM GOAL #4  ? Title Pt will improve 30 sec STS >12 reps to display clinically significnat improvement in LE strength/endurance for functional mobility.   ? Baseline 11/23: 7 reps 1/19: 10.5 reps   ? Time 8   ? Period Weeks   ? Status Partially Met   ? Target Date 02/17/22   ? ?  ?  ? ?  ? ? ? ? ? ? ? ? Plan - 02/17/22 1300   ? ? Clinical Impression Statement Tx. focus on B shoulder ROM/ strengthening with proper upright posture/ technique.  Moderate cuing to correct shoulder/ head position during Nautilius.  Mirror feedback during tx. to demonstrate proper technique/ feedback during ex.  Several short seated rest breaks during tx. session secondary fatigue, not increase pain.  PT will reassess goals next tx. session and discuss POC.   ? Personal Factors and  Comorbidities Comorbidity  2   ? Comorbidities Hypertension, Obesity   ? Examination-Activity Limitations Bed Mobility;Reach Overhead;Squat;Lift;Dressing;Hygiene/Grooming   ? Examination-Participation Restrictions Community Activity;[employer] Work   ? Stability/Clinical Decision Making Evolving/Moderate complexity   ? Clinical Decision Making Moderate   ? Rehab Potential Fair   ? PT Frequency 2x / week   ? PT Duration 8 weeks   ? PT Treatment/Interventions ADLs/Self Care Home Management;Gait training;Stair training;Functional mobility training;Therapeutic activities;Therapeutic exercise;Balance training;Neuromuscular re-education;Manual techniques;Patient/family education;Electrical Stimulation;Moist Heat;Cryotherapy   ? PT Next Visit Plan Progress LE strengthening.  Reassess goals/ Recertification.   ? PT Home Exercise Plan Shoulder pulleys in flex, scaption, abd, pendelums, scap retractions with resistance (RTB), D2 flexion pattern in sitting no resistance.   ? Consulted and Agree with Plan of Care Patient   ? ?  ?  ? ?  ? ? ?Patient will benefit from skilled therapeutic intervention in order to improve the following deficits and impairments:  Abnormal gait, Decreased activity tolerance, Decreased balance, Decreased coordination, Decreased mobility, Decreased endurance, Decreased range of motion, Decreased strength, Difficulty walking, Dizziness, Impaired UE functional use, Pain, Hypomobility ? ?Visit Diagnosis: ?Difficulty in walking, not elsewhere classified ? ?Muscle weakness (generalized) ? ?Unsteadiness on feet ? ?Abnormality of gait and mobility ? ?Decreased range of motion of right shoulder ? ? ? ? ?Problem List ?Patient Active Problem List  ? Diagnosis Date Noted  ? Hemorrhoid 10/18/2021  ? Obesity, Class III, BMI 40-49.9 (morbid obesity) ([place])   ? Atrial fibrillation with RVR ([place]) 07/14/2020  ? Persistent atrial fibrillation ([place]) 07/14/2020  ? Hypertension   ? Hemiparesis affecting right side as late effect of cerebrovascular accident  ([place]) 08/13/2019  ? History of kidney stones 05/02/2018  ? ED (erectile dysfunction) 05/02/2018  ? Hyperglycemia 01/30/2017  ? Arthritis of knee, degenerative 01/30/2017  ? BPH with obstruction/lower

## 2022-02-22 ENCOUNTER — Ambulatory Visit: Payer: Medicare Other | Admitting: Psychologist

## 2022-02-24 ENCOUNTER — Ambulatory Visit: Payer: Medicare Other | Admitting: Physical Therapy

## 2022-02-25 ENCOUNTER — Encounter: Payer: Self-pay | Admitting: Internal Medicine

## 2022-02-25 ENCOUNTER — Ambulatory Visit (INDEPENDENT_AMBULATORY_CARE_PROVIDER_SITE_OTHER): Payer: Medicare Other | Admitting: Internal Medicine

## 2022-02-25 VITALS — BP 122/60 | HR 81 | Temp 98.4°F | Resp 16 | Ht 70.0 in | Wt 288.2 lb

## 2022-02-25 DIAGNOSIS — J069 Acute upper respiratory infection, unspecified: Secondary | ICD-10-CM

## 2022-02-25 DIAGNOSIS — R350 Frequency of micturition: Secondary | ICD-10-CM

## 2022-02-25 LAB — POCT URINALYSIS DIPSTICK
Bilirubin, UA: NEGATIVE
Blood, UA: NEGATIVE
Glucose, UA: NEGATIVE
Ketones, UA: NEGATIVE
Leukocytes, UA: NEGATIVE
Nitrite, UA: NEGATIVE
Odor: NORMAL
Protein, UA: NEGATIVE
Spec Grav, UA: 1.02 (ref 1.010–1.025)
Urobilinogen, UA: 0.2 E.U./dL
pH, UA: 6 (ref 5.0–8.0)

## 2022-02-25 MED ORDER — FLUTICASONE PROPIONATE 50 MCG/ACT NA SUSP: 2.0000 | Freq: Every day | NASAL | 6 refills | Status: DC

## 2022-02-25 NOTE — Progress Notes (Signed)
? ?Acute Office Visit ? ?Subjective:  ? ? Patient ID: Tom Underwood, male    DOB: 09-20-1951, 71 y.o.   MRN: [id number] ? ?Chief Complaint  ?Patient presents with  ? URI  ?  Cough and runny nose for 4 days  ? Urinary Frequency  ?  Pt states when he stands up after sitting for a why has urgency   ? ? ?HPI ?Patient is in today for cough/congestion. 3 days ago clear rhinorrhea and nasal congestion. ? ?URI Compliant:  ?-Fever: no ?-Cough: yes productive clear  ?-Shortness of breath: no ?-Wheezing: yes ?-Chest pain: no ?-Chest tightness: no ?-Chest congestion: no ?-Nasal congestion: yes ?-Runny nose: yes ?-Post nasal drip: no ?-Sneezing: yes ?-Sore throat: no ?-Swollen glands: no ?-Sinus pressure: yes ?-Headache: no ?-Ear pain: no  ?-Ear pressure: no  ?-Eyes red/itching:no ?-Eye drainage/crusting: no  ?-Vomiting: no ?-Sick contacts: yes ?-Context: stable ?-Relief with OTC cold/cough medications: no  ?-Treatments attempted: mucinex. Takes Zyrtec  ? ?URINARY SYMPTOMS - started 5 weeks ago  ? ?Dysuria: no ?Urinary frequency: yes ?Urgency: yes ?Small volume voids: no ?Urinary incontinence: no ?Foul odor: no ?Hematuria: no ?Abdominal pain: no ?Back pain: no ?Suprapubic pain/pressure: no ? ?Past Medical History:  ?Diagnosis Date  ? Allergy   ? Decreased libido   ? Diverticulitis   ? GERD (gastroesophageal reflux disease)   ? HLD (hyperlipidemia)   ? Hydronephrosis with renal and ureteral calculus obstruction   ? Hydronephrosis with renal and ureteral calculus obstruction   ? Hypertension   ? Hypogonadism in male   ? Rhinitis, allergic   ? Sleep apnea   ? Stroke [facility])   ? Ureteral stone   ? ? ?Past Surgical History:  ?Procedure Laterality Date  ? CARDIOVERSION N/A 12/08/2021  ? Procedure: CARDIOVERSION;  Surgeon: Yvonne Kendall, MD;  Location: [facility] ORS;  Service: Cardiovascular;  Laterality: N/A;  ? EXTERNAL EAR SURGERY    ? FINGER SURGERY    ? KNEE SURGERY    ? left eye    ? ? ?Family History  ?Problem Relation Age of Onset   ? Asthma Mother   ? Heart disease Mother   ? Heart attack Mother   ? Dementia Mother   ? Asthma Father   ? Heart disease Father   ? Stroke Father   ? ? ?Social History  ? ?Socioeconomic History  ? Marital status: Married  ?  Spouse name: [name]   ? Number of children: 2  ? Years of education: Not on file  ? Highest education level: Not on file  ?Occupational History  ? Occupation: retired   ?Tobacco Use  ? Smoking status: Former  ?  Types: Pipe  ?  Start date: 02/20/1973  ?  Quit date: 02/20/1974  ?  Years since quitting: 48.0  ? Smokeless tobacco: Never  ?Vaping Use  ? Vaping Use: Never used  ?Substance and Sexual Activity  ? Alcohol use: No  ?  Alcohol/week: 0.0 standard drinks  ? Drug use: No  ? Sexual activity: Yes  ?  Partners: Female  ?Other Topics Concern  ? Not on file  ?Social History Narrative  ? Not on file  ? ?Social Determinants of Health  ? ?Financial Resource Strain: Low Risk   ? Difficulty of Paying Living Expenses: Not hard at all  ?Food Insecurity: No Food Insecurity  ? Worried About Programme researcher, broadcasting/film/video in the Last Year: Never true  ? Ran Out of Food in the Last Year: Never  true  ?Transportation Needs: No Transportation Needs  ? Lack of Transportation (Medical): No  ? Lack of Transportation (Non-Medical): No  ?Physical Activity: Insufficiently Active  ? Days of Exercise per Week: 4 days  ? Minutes of Exercise per Session: 10 min  ?Stress: Stress Concern Present  ? Feeling of Stress : To some extent  ?Social Connections: Socially Integrated  ? Frequency of Communication with Friends and Family: More than three times a week  ? Frequency of Social Gatherings with Friends and Family: Twice a week  ? Attends Religious Services: More than 4 times per year  ? Active Member of Clubs or Organizations: Yes  ? Attends Banker Meetings: More than 4 times per year  ? Marital Status: Married  ?Intimate Partner Violence: Not At Risk  ? Fear of Current or Ex-Partner: No  ? Emotionally Abused: No  ?  Physically Abused: No  ? Sexually Abused: No  ? ? ?Outpatient Medications Prior to Visit  ?Medication Sig Dispense Refill  ? acetaminophen (TYLENOL) 650 MG CR tablet Take 1,300 mg by mouth every evening.    ? amitriptyline (ELAVIL) 25 MG tablet TAKE 1 TABLET BY MOUTH AT BEDTIME AS NEEDED FOR SLEEP 90 tablet 0  ? buPROPion (WELLBUTRIN XL) 150 MG 24 hr tablet Take 1 tablet (150 mg total) by mouth every morning. 90 tablet 1  ? busPIRone (BUSPAR) 10 MG tablet Take 1 tablet (10 mg total) by mouth 2 (two) times daily. 100 tablet 5  ? cetirizine (ZYRTEC) 10 MG tablet Take 10 mg by mouth in the morning.     ? Cholecalciferol (VITAMIN D) 125 MCG (5000 UT) CAPS Take 5,000 Units by mouth daily.    ? furosemide (LASIX) 20 MG tablet Take 1 tablet (20 mg total) by mouth as needed. For lower extremity swelling. 90 tablet 1  ? GLUCOSAMINE-CHONDROITIN PO Take 2 tablets by mouth in the morning and at bedtime.    ? hydrocortisone (ANUSOL-HC) 25 MG suppository Place 25 mg rectally 2 (two) times daily as needed for hemorrhoids or anal itching.    ? meloxicam (MOBIC) 15 MG tablet as needed.    ? metoprolol tartrate (LOPRESSOR) 50 MG tablet Take 0.5 tablets (25 mg total) by mouth 2 (two) times daily. 180 tablet 0  ? Multiple Vitamin (MULTIVITAMIN) tablet Take 1 tablet by mouth daily.    ? omega-3 acid ethyl esters (LOVAZA) 1 g capsule Take 2 capsules (2 g total) by mouth 2 (two) times daily. 360 capsule 1  ? pantoprazole (PROTONIX) 20 MG tablet Take 1 tablet (20 mg total) by mouth daily. 90 tablet 0  ? rivaroxaban (XARELTO) 20 MG TABS tablet Take 1 tablet (20 mg total) by mouth daily with supper. 30 tablet 5  ? rosuvastatin (CRESTOR) 20 MG tablet Take 1 tablet (20 mg total) by mouth daily. 90 tablet 0  ? Saccharomyces boulardii (PROBIOTIC) 250 MG CAPS Take 1 capsule by mouth daily. 30 capsule 0  ? tamsulosin (FLOMAX) 0.4 MG CAPS capsule Take 1 capsule (0.4 mg total) by mouth daily. 90 capsule 1  ? valsartan (DIOVAN) 80 MG tablet Take 1  tablet (80 mg total) by mouth daily. 90 tablet 0  ? VISINE DRY EYE RELIEF 1 % SOLN Place 1 drop into both eyes daily as needed (dry eyes).    ? ?No facility-administered medications prior to visit.  ? ? ?Allergies  ?Allergen Reactions  ? Penicillins Itching  ? Propofol Other (See Comments)  ?  Skin burning and  involuntary muscle twitching  ? ? ?Review of Systems  ?Constitutional:  Negative for chills and fever.  ?HENT:  Positive for congestion, rhinorrhea and sinus pressure. Negative for ear pain, postnasal drip, sinus pain and sore throat.   ?Respiratory:  Positive for cough and wheezing. Negative for shortness of breath.   ?Cardiovascular:  Negative for chest pain.  ?Gastrointestinal:  Negative for abdominal pain, diarrhea, nausea and vomiting.  ?Genitourinary:  Positive for frequency and urgency. Negative for dysuria, flank pain and hematuria.  ?Neurological:  Negative for dizziness and headaches.  ? ?   ?Objective:  ?  ?Physical Exam ?Constitutional:   ?   Appearance: Normal appearance.  ?HENT:  ?   Head: Normocephalic and atraumatic.  ?   Nose: Congestion present.  ?   Mouth/Throat:  ?   Mouth: Mucous membranes are moist.  ?   Pharynx: Oropharynx is clear.  ?Eyes:  ?   Conjunctiva/sclera: Conjunctivae normal.  ?Cardiovascular:  ?   Rate and Rhythm: Normal rate and regular rhythm.  ?Pulmonary:  ?   Effort: Pulmonary effort is normal.  ?   Breath sounds: Normal breath sounds.  ?Musculoskeletal:  ?   Right lower leg: No edema.  ?   Left lower leg: No edema.  ?Skin: ?   General: Skin is warm and dry.  ?Neurological:  ?   General: No focal deficit present.  ?   Mental Status: He is alert. Mental status is at baseline.  ?Psychiatric:     ?   Mood and Affect: Mood normal.     ?   Behavior: Behavior normal.  ? ? ?BP 122/60   Pulse 81   Temp 98.4 ?F (36.9 ?C)   Resp 16   Ht 5\' 10"  (1.778 m)   Wt 288 lb 3.2 oz (130.7 kg)   SpO2 92%   BMI 41.35 kg/m?  ?Wt Readings from Last 3 Encounters:  ?02/07/22 291 lb (132 kg)   ?12/08/21 286 lb (129.7 kg)  ?11/15/21 288 lb (130.6 kg)  ? ? ?Health Maintenance Due  ?Topic Date Due  ? Zoster Vaccines- Shingrix (1 of 2) Never done  ? COVID-19 Vaccine (4 - Booster for Moderna series) 02/14/

## 2022-02-25 NOTE — Patient Instructions (Addendum)
It was great seeing you today! ? ?Plan discussed at today's visit: ?-Blood work ordered today, results will be uploaded to MyChart.  ?-Test for COVID today and urine today as well ?-Use nasal saline and Flonase to help with congestion. Can stop Mucinex unless you have chest congestion ?-Can take Coricidin hbp for congestion ?-Continue to take Zyrtec  ? ?Follow up in: as needed  ? ?Take care and let us know if you have any questions or concerns prior to your next visit. ? ?Dr. Caralee Ates ? ?

## 2022-02-27 LAB — NOVEL CORONAVIRUS, NAA: SARS-CoV-2, NAA: NOT DETECTED

## 2022-02-27 LAB — SPECIMEN STATUS REPORT

## 2022-03-03 ENCOUNTER — Encounter: Payer: Medicare Other | Admitting: Physical Therapy

## 2022-03-03 ENCOUNTER — Ambulatory Visit: Payer: Medicare Other | Admitting: Physical Therapy

## 2022-03-07 DIAGNOSIS — I10 Essential (primary) hypertension: Secondary | ICD-10-CM | POA: Diagnosis not present

## 2022-03-07 DIAGNOSIS — G8929 Other chronic pain: Secondary | ICD-10-CM | POA: Diagnosis not present

## 2022-03-07 DIAGNOSIS — R251 Tremor, unspecified: Secondary | ICD-10-CM | POA: Diagnosis not present

## 2022-03-07 DIAGNOSIS — Z9989 Dependence on other enabling machines and devices: Secondary | ICD-10-CM | POA: Diagnosis not present

## 2022-03-07 DIAGNOSIS — Z8673 Personal history of transient ischemic attack (TIA), and cerebral infarction without residual deficits: Secondary | ICD-10-CM | POA: Diagnosis not present

## 2022-03-07 DIAGNOSIS — M25562 Pain in left knee: Secondary | ICD-10-CM | POA: Diagnosis not present

## 2022-03-07 DIAGNOSIS — K649 Unspecified hemorrhoids: Secondary | ICD-10-CM | POA: Diagnosis not present

## 2022-03-07 DIAGNOSIS — G4733 Obstructive sleep apnea (adult) (pediatric): Secondary | ICD-10-CM | POA: Diagnosis not present

## 2022-03-09 DIAGNOSIS — K645 Perianal venous thrombosis: Secondary | ICD-10-CM | POA: Diagnosis not present

## 2022-03-10 ENCOUNTER — Ambulatory Visit: Payer: Medicare Other | Attending: Neurology | Admitting: Physical Therapy

## 2022-03-10 ENCOUNTER — Telehealth: Payer: Self-pay

## 2022-03-10 ENCOUNTER — Encounter: Payer: Self-pay | Admitting: Physical Therapy

## 2022-03-10 ENCOUNTER — Telehealth: Payer: Self-pay | Admitting: Cardiology

## 2022-03-10 DIAGNOSIS — M25511 Pain in right shoulder: Secondary | ICD-10-CM | POA: Insufficient documentation

## 2022-03-10 DIAGNOSIS — R2681 Unsteadiness on feet: Secondary | ICD-10-CM | POA: Diagnosis not present

## 2022-03-10 DIAGNOSIS — R269 Unspecified abnormalities of gait and mobility: Secondary | ICD-10-CM | POA: Diagnosis not present

## 2022-03-10 DIAGNOSIS — R2689 Other abnormalities of gait and mobility: Secondary | ICD-10-CM | POA: Insufficient documentation

## 2022-03-10 DIAGNOSIS — Z7901 Long term (current) use of anticoagulants: Secondary | ICD-10-CM

## 2022-03-10 DIAGNOSIS — G8929 Other chronic pain: Secondary | ICD-10-CM | POA: Diagnosis not present

## 2022-03-10 DIAGNOSIS — R262 Difficulty in walking, not elsewhere classified: Secondary | ICD-10-CM | POA: Diagnosis not present

## 2022-03-10 DIAGNOSIS — M25611 Stiffness of right shoulder, not elsewhere classified: Secondary | ICD-10-CM | POA: Diagnosis not present

## 2022-03-10 DIAGNOSIS — M6281 Muscle weakness (generalized): Secondary | ICD-10-CM | POA: Insufficient documentation

## 2022-03-10 NOTE — Telephone Encounter (Signed)
?  Pt c/o medication issue: ? ?1. Name of Medication: rivaroxaban (XARELTO) 20 MG TABS tablet ? ?2. How are you currently taking this medication (dosage and times per day)? Take 1 tablet (20 mg total) by mouth daily with supper. ? ?3. Are you having a reaction (difficulty breathing--STAT)?  ? ?4. What is your medication issue? ?Dr. Geoffery Lyons office calling back, pt cancelled the procedure and wanted to wait. However, since pt have bleeding hemorrhoids, Dr. Tonna Boehringer wanted to know if pt can still stop blood thinner while the hemorrhoids is healing   ?

## 2022-03-10 NOTE — Telephone Encounter (Signed)
? ?  Pre-operative Risk Assessment  ?  ?Patient Name: Tom Underwood  ?DOB: [birth year] ?MRN: [id number]  ? ?  ? ?Request for Surgical Clearance   ? ?Procedure:   Hemorrhoidectomy ? ?Date of Surgery:  Clearance TBD                              ?   ?Surgeon:  Dr. Jacquelyne Balint ?Surgeon's Group or Practice Name:  [facility] ?Phone number:  [phone] ?Fax number:  [phone]  ?  ?Type of Clearance Requested:   ?- Medical  ?- Pharmacy:  Hold Apixaban (Eliquis)   ?  ?Type of Anesthesia:  Not Indicated ?  ?Additional requests/questions:   Dr Lysle Pearl would like pt to stop Eliquis now if it is safe to do so until after his surgery.  ? ?Signed, ?Kamariah Fruchter   ?03/10/2022, 11:17 AM  ? ?

## 2022-03-10 NOTE — Telephone Encounter (Signed)
Patient with diagnosis of afib on Xarelto for anticoagulation.   ? ?Procedure: Hemorrhoidectomy ?Date of procedure: TBD ? ?CHA2DS2-VASc Score = 4  ?This indicates a 4.8% annual risk of stroke. ?The patient's score is based upon: ?CHF History: 0 ?HTN History: 1 ?Diabetes History: 0 ?Stroke History: 2 ?Vascular Disease History: 0 ?Age Score: 1 ?Gender Score: 0 ?  ?Underwent DCCV 12/08/21. ? ?CrCl 53mL/min using adjusted body weight due to obesity ?Platelet count 240K ? ?Request asks if pt can stop anticoag now until his procedure. This cannot be determined without knowing his procedure date, request just states TBD. He should only hold his Xarelto for 1 day prior to procedure due to afib and prior stroke. He should resume as soon as safely possible after his procedure. ? ?

## 2022-03-11 ENCOUNTER — Other Ambulatory Visit: Payer: Self-pay | Admitting: *Deleted

## 2022-03-11 DIAGNOSIS — E785 Hyperlipidemia, unspecified: Secondary | ICD-10-CM

## 2022-03-11 DIAGNOSIS — I10 Essential (primary) hypertension: Secondary | ICD-10-CM

## 2022-03-11 MED ORDER — ROSUVASTATIN CALCIUM 20 MG PO TABS: 20.0000 mg | ORAL_TABLET | Freq: Every day | ORAL | 2 refills | Status: DC

## 2022-03-11 MED ORDER — VALSARTAN 80 MG PO TABS: 80.0000 mg | ORAL_TABLET | Freq: Every day | ORAL | 2 refills | Status: DC

## 2022-03-11 NOTE — Telephone Encounter (Signed)
Debbe Odea, MD  You 13 hours ago (6:38 PM)  ? ?Patient has had a stroke before.  He is high risk for recurrent stroke if Xarelto is stopped.  Would not recommend stopping unless he understands risks fully, has frank bleeding or is significantly anemic.  Please order CBC.Marland Kitchen  Please schedule follow-up appointment with myself or an APP earlier than currently scheduled for eval.  ? ?Ty  ?BA   ? ?

## 2022-03-11 NOTE — Telephone Encounter (Signed)
Attempted to call Dr. Geoffery Lyons office. Only their after hours line is available. Did not leave a message. ? ?Called the patient directly. ?Patient made aware of Dr. Merita Norton response and recommendation. Patient is agreeable with the the poc. ?Order placed for Cbc. ?Msg fwd to scheduling to call the patient to schedule a lab appt and sooner f/u. ? ?

## 2022-03-11 NOTE — Telephone Encounter (Signed)
? ?  Patient Name: Tom Underwood  ?DOB: 11-24-1951 ?MRN: [id number] ? ?Primary Cardiologist: Debbe Odea, MD ? ?Chart reviewed as part of pre-operative protocol coverage.  The patient has an upcoming visit scheduled with Dr. Azucena Cecil on 03/17/2022 at which time clearance can be addressed in case there are any issues that would impact surgical recommendations ? ?Hemorrhoidectomy date TBD as below.  I added preop FYI to appointment notes that provider is aware to address at time of outpatient visit.  Per office protocol the cardiology provider should for their finalize clearance decision and recommendations regarding antiplatelet therapy to requesting party below. ? ?I will route this message as FYI to requesting party and remove this message from the preop box as separate preop APP input not needed at this time.  Please call with any questions. ? ?Per clinical pharmacist recommendation, he should only hold his Xarelto for 1 day prior to procedure due to afib and prior stroke. He should resume as soon as safely possible after his procedure. ? ?Joylene Grapes, NP ?03/11/2022, 10:24 AM ? ?

## 2022-03-12 NOTE — Therapy (Deleted)
[place] ?[facility] ?[address] [place], [place], [address] ?Phone: [phone]   Fax:  [phone] ? ?Physical Therapy Treatment ? ?Patient Details  ?Name: Tom Underwood ?MRN: [id number] ?Date of Birth: 1951-12-04 ?Referring Provider (PT): Steele Sizer, MD ? ? ?Encounter Date: 03/10/2022 ? ? PT End of Session - 03/12/22 1308   ? ? Visit Number 16   ? Number of Visits 24   ? Date for PT Re-Evaluation 05/05/22   ? Authorization Type Medicare A&B; generic commercial (visit limits based on medical necessity)   ? Authorization - Visit Number 6   ? Authorization - Number of Visits 10   ? PT Start Time 8502   ? PT Stop Time 1400   ? PT Time Calculation (min) 64 min   ? Equipment Utilized During Treatment Gait belt   ? Activity Tolerance Patient limited by fatigue   ? Behavior During Therapy [facility] for tasks assessed/performed   ? ?  ?  ? ?  ? ? ?Past Medical History:  ?Diagnosis Date  ? Allergy   ? Decreased libido   ? Diverticulitis   ? GERD (gastroesophageal reflux disease)   ? HLD (hyperlipidemia)   ? Hydronephrosis with renal and ureteral calculus obstruction   ? Hydronephrosis with renal and ureteral calculus obstruction   ? Hypertension   ? Hypogonadism in male   ? Rhinitis, allergic   ? Sleep apnea   ? Stroke [facility])   ? Ureteral stone   ? ? ?Past Surgical History:  ?Procedure Laterality Date  ? CARDIOVERSION N/A 12/08/2021  ? Procedure: CARDIOVERSION;  Surgeon: Nelva Bush, MD;  Location: [facility] ORS;  Service: Cardiovascular;  Laterality: N/A;  ? EXTERNAL EAR SURGERY    ? FINGER SURGERY    ? KNEE SURGERY    ? left eye    ? ? ?There were no vitals filed for this visit. ? ? ? ? ? ? ? ? ? ? ? ? ? ? ? ? ? ? ? ? ? ? ? ? ? ? ? ? ? ? ? ? PT Short Term Goals - 12/24/21 0810   ? ?  ? PT SHORT TERM GOAL #1  ? Title Pt will be indep with HEP to improve strength and balance to reduce risk of falls.   ? Baseline 11/23: initiated   ? Time 4   ? Period Weeks   ? Status Achieved   ? ?  ?   ? ?  ? ? ? ? PT Long Term Goals - 01/18/22 1025   ? ?  ? PT LONG TERM GOAL #1  ? Title Pt will improve FOTO score to target to display improvement in functional mobility.   ? Baseline 11/23: 29/54   ? Time 8   ? Period Weeks   ? Status Partially Met   ? Target Date 02/17/22   ?  ? PT LONG TERM GOAL #2  ? Title Pt wil improve TUG score to < 12 sec with no AD to display significant imporvement in reduced risk of falls   ? Baseline 11/23: 26 sec (did not sit down immediately leading to higher time than he would've scored) 1/19: 14.44 sec   ? Time 8   ? Period Weeks   ? Status Partially Met   ? Target Date 02/17/22   ?  ? PT LONG TERM GOAL #3  ? Title Pt will improve DGI to >19/24 to indicate decreased risk of falls with  household and community walking tasks   ? Baseline 11/23: 11/24 1/19:12/24   ? Time 8   ? Period Weeks   ? Status Partially Met   ? Target Date 02/17/22   ?  ? PT LONG TERM GOAL #4  ? Title Pt will improve 30 sec STS >12 reps to display clinically significnat improvement in LE strength/endurance for functional mobility.   ? Baseline 11/23: 7 reps 1/19: 10.5 reps   ? Time 8   ? Period Weeks   ? Status Partially Met   ? Target Date 02/17/22   ? ?  ?  ? ?  ? ? ? ? ? ? ? ? ? ?Patient will benefit from skilled therapeutic intervention in order to improve the following deficits and impairments:    ? ?Visit Diagnosis: ?Difficulty in walking, not elsewhere classified ? ?Muscle weakness (generalized) ? ?Unsteadiness on feet ? ?Abnormality of gait and mobility ? ?Decreased range of motion of right shoulder ? ?Chronic right shoulder pain ? ?Other abnormalities of gait and mobility ? ? ? ? ?Problem List ?Patient Active Problem List  ? Diagnosis Date Noted  ? Hemorrhoid 10/18/2021  ? Obesity, Class III, BMI 40-49.9 (morbid obesity) ([place])   ? Atrial fibrillation with RVR ([place]) 07/14/2020  ? Persistent atrial fibrillation ([place]) 07/14/2020  ? Hypertension   ? Hemiparesis affecting right side as late effect of  cerebrovascular accident ([place]) 08/13/2019  ? History of kidney stones 05/02/2018  ? ED (erectile dysfunction) 05/02/2018  ? Hyperglycemia 01/30/2017  ? Arthritis of knee, degenerative 01/30/2017  ? BPH with obstruction/lower urinary tract symptoms 02/03/2016  ? Gross hematuria 02/03/2016  ? Dyslipidemia 08/04/2015  ? Diverticulitis of large intestine without perforation or abscess without bleeding 06/30/2015  ? Edema 06/30/2015  ? OSA on CPAP 06/30/2015  ? ? ?Pura Spice, PT ?03/12/2022, 1:10 PM ? ?[place] ?[facility] ?[address] [place], [place], [address] ?Phone: [phone]   Fax:  [phone] ? ?Name: Tom Underwood ?MRN: [id number] ?Date of Birth: 04-10-1951 ? ? ? ?

## 2022-03-13 NOTE — Addendum Note (Signed)
Addended by: Cammie Mcgee on: 03/13/2022 06:48 PM ? ? Modules accepted: Orders ? ?

## 2022-03-13 NOTE — Therapy (Addendum)
?OUTPATIENT PHYSICAL THERAPY TREATMENT NOTE ? ? ?Patient Name: Tom Underwood ?MRN: [id number] ?DOB:05/17/1951, 71 y.o., male ?Today's Date: 03/13/2022 ? ?PCP: Steele Sizer, MD ?REFERRING PROVIDER: Steele Sizer, MD ? ? PT End of Session - 03/12/22 1308   ? ? Visit Number 16   ? Number of Visits 24   ? Date for PT Re-Evaluation 05/05/22   ? Authorization Type Medicare A&B; generic commercial (visit limits based on medical necessity)   ? Authorization - Visit Number 6   ? Authorization - Number of Visits 10   ? PT Start Time 3428   ? PT Stop Time 1400   ? PT Time Calculation (min) 64 min   ? Equipment Utilized During Treatment Gait belt   ? Activity Tolerance Patient limited by fatigue   ? Behavior During Therapy [facility] for tasks assessed/performed   ? ?  ?  ? ?  ? ? ?Past Medical History:  ?Diagnosis Date  ? Allergy   ? Decreased libido   ? Diverticulitis   ? GERD (gastroesophageal reflux disease)   ? HLD (hyperlipidemia)   ? Hydronephrosis with renal and ureteral calculus obstruction   ? Hydronephrosis with renal and ureteral calculus obstruction   ? Hypertension   ? Hypogonadism in male   ? Rhinitis, allergic   ? Sleep apnea   ? Stroke [facility])   ? Ureteral stone   ? ?Past Surgical History:  ?Procedure Laterality Date  ? CARDIOVERSION N/A 12/08/2021  ? Procedure: CARDIOVERSION;  Surgeon: Nelva Bush, MD;  Location: [facility] ORS;  Service: Cardiovascular;  Laterality: N/A;  ? EXTERNAL EAR SURGERY    ? FINGER SURGERY    ? KNEE SURGERY    ? left eye    ? ?Patient Active Problem List  ? Diagnosis Date Noted  ? Hemorrhoid 10/18/2021  ? Obesity, Class III, BMI 40-49.9 (morbid obesity) ([place])   ? Atrial fibrillation with RVR ([place]) 07/14/2020  ? Persistent atrial fibrillation ([place]) 07/14/2020  ? Hypertension   ? Hemiparesis affecting right side as late effect of cerebrovascular accident ([place]) 08/13/2019  ? History of kidney stones 05/02/2018  ? ED (erectile dysfunction) 05/02/2018  ? Hyperglycemia 01/30/2017  ? Arthritis of  knee, degenerative 01/30/2017  ? BPH with obstruction/lower urinary tract symptoms 02/03/2016  ? Gross hematuria 02/03/2016  ? Dyslipidemia 08/04/2015  ? Diverticulitis of large intestine without perforation or abscess without bleeding 06/30/2015  ? Edema 06/30/2015  ? OSA on CPAP 06/30/2015  ? ? ?REFERRING DIAG: M25.511,M25.512 (ICD-10-CM) - Bilateral shoulder pain, unspecified chronicity ?M17.2 (ICD-10-CM) - Post-traumatic osteoarthritis of both knees  ?R26.81 (ICD-10-CM) - Gait instability  ? ? ?THERAPY DIAG:  ?Difficulty in walking, not elsewhere classified ? ?Muscle weakness (generalized) ? ?Unsteadiness on feet ? ?Abnormality of gait and mobility ? ?Decreased range of motion of right shoulder ? ?Chronic right shoulder pain ? ?Other abnormalities of gait and mobility ? ?PERTINENT HISTORY: See evaluation ? ?PRECAUTIONS: Fall ? ?SUBJECTIVE: Pt. Has been sick over past 2 weeks which is why pt. Has cancelled last 2 PT appointments.  Pt. C/o 6/10 R hip pain and 1-2/10 B knee pain prior to tx. Session.  Pt. States he had a misstep the other day and presents with R ankle ecchymosis/ swelling.  Pt. Has a yellowish liquid/ drainage from sore on ankle.  Pt. Socks is slightly wet with hardened yellowish stain.  Pt. Is also dealing with hemorrhoids and planning to have removed by MD soon.  ? ?PAIN:  ?Are you having pain? Yes:  NPRS scale: 6/10 ?Pain location: R hip ?Pain description: aching ?Aggravating factors: prolonged standing/ walking ?Relieving factors: rest ? ? ? ? ?TODAY'S TREATMENT:  ?Ther.ex.: ?  ?Nustep B UE/LE for 10 minutes L5 (consistent cadence).  ?  ?Outside walking on varying terrain with consistent step pattern/ heel strike.  Moderate cuing for posture.  Slight R antalgic gait.   ? ?Standing B shoulder wand ex.: chest press/ shoulder flexion/ extension 10x each.  Pharmacist, hospital.   ?  ?Nautilus seated lat pull down 60#, standing tricep extension 40#, standing scap. Retraction 40# 30x each. ?  ?Walking in  hallway with cuing to correct arm swing.  No LOB.    ?  ?Seated marching/ LAQ 20x.   ? ?Walking in //-bars (forward/ lateral)- high marching with mirror feedback.  No LE resisted ex. Today.   ? ?Discussed HEP  ?  ?  ? ? ?PATIENT EDUCATION: ?Education details: HEP/ daily activity ?Person educated: Patient ?Education method: Explanation and Demonstration ?Education comprehension: verbalized understanding and returned demonstration ? ? ?HOME EXERCISE PROGRAM: ?Handouts given for shoulder ROM/ strengthening/ standing hip ex.  ? ? PT Short Term Goals -  ? ?  ? PT SHORT TERM GOAL #1  ? Title Pt will be indep with HEP to improve strength and balance to reduce risk of falls.   ? Baseline 11/23: initiated   ? Time 4   ? Period Weeks   ? Status Achieved   ? ?  ?  ? ?  ? ? ? PT Long Term Goals -  ? ?  ? PT LONG TERM GOAL #1  ? Title Pt will improve FOTO score to target to display improvement in functional mobility.   ? Baseline 11/23: 29/54   ? Time 8   ? Period Weeks   ? Status Partially Met   ? Target Date 05/05/22   ?  ? PT LONG TERM GOAL #2  ? Title Pt wil improve TUG score to < 12 sec with no AD to display significant imporvement in reduced risk of falls   ? Baseline 11/23: 26 sec (did not sit down immediately leading to higher time than he would've scored) 1/19: 14.44 sec   ? Time 8   ? Period Weeks   ? Status Partially Met   ? Target Date 05/05/22   ?  ? PT LONG TERM GOAL #3  ? Title Pt will improve DGI to >19/24 to indicate decreased risk of falls with household and community walking tasks   ? Baseline 11/23: 11/24 1/19:12/24   ? Time 8   ? Period Weeks   ? Status Partially Met   ? Target Date 05/05/22   ?  ? PT LONG TERM GOAL #4  ? Title Pt will improve 30 sec STS >12 reps to display clinically significnat improvement in LE strength/endurance for functional mobility.   ? Baseline 11/23: 7 reps 1/19: 10.5 reps   ? Time 8   ? Period Weeks   ? Status Partially Met   ? Target Date 05/05/22   ? ?  ?  ? ?  ? ? ? Plan - 03/13/22  1824   ? ? Clinical Impression Statement Tx. Progression limited to day secondary to R ankle swelling/ ecchymosis.  Pt. Is also recovering from a cold over past 2 weeks.  Pt. remains motivated and works hard during generalized LE/UE strengthening ex. program.  Pt. likes the UE/shoulder stretch during seated B sh. lat. pull downs with use of  wand.  Several seated rest break required during tx. due to fatigue.  Pt. will continue to benefit from supervised PT so that he is able to progress LE dynamic exercises while monitoring his O2 sat rate. No LOB during tx. session.   ? Personal Factors and Comorbidities Comorbidity 2   ? Comorbidities Hypertension, Obesity   ? Examination-Activity Limitations Bed Mobility;Reach Overhead;Squat;Lift;Dressing;Hygiene/Grooming   ? Examination-Participation Restrictions Community Activity;Valla Leaver Work   ? Stability/Clinical Decision Making Evolving/Moderate complexity   ? Clinical Decision Making Moderate   ? Rehab Potential Fair   ? PT Frequency 1x / week   ? PT Duration 8 weeks   ? PT Treatment/Interventions ADLs/Self Care Home Management;Gait training;Stair training;Functional mobility training;Therapeutic activities;Therapeutic exercise;Balance training;Neuromuscular re-education;Manual techniques;Patient/family education;Electrical Stimulation;Moist Heat;Cryotherapy   ? PT Next Visit Plan Progress LE strengthening.   ? PT Home Exercise Plan Shoulder pulleys in flex, scaption, abd, pendelums, scap retractions with resistance (RTB), D2 flexion pattern in sitting no resistance.   ? Consulted and Agree with Plan of Care Patient   ? ?  ?  ? ?  ? ? ? ?Pura Spice, PT, DPT # [phone] ?03/13/2022, 6:27 PM ? ?   ?

## 2022-03-14 ENCOUNTER — Other Ambulatory Visit (INDEPENDENT_AMBULATORY_CARE_PROVIDER_SITE_OTHER): Payer: Medicare Other

## 2022-03-14 DIAGNOSIS — Z7901 Long term (current) use of anticoagulants: Secondary | ICD-10-CM

## 2022-03-15 LAB — CBC WITH DIFFERENTIAL/PLATELET
Basophils Absolute: 0 10*3/uL (ref 0.0–0.2)
Basos: 1 %
EOS (ABSOLUTE): 0.1 10*3/uL (ref 0.0–0.4)
Eos: 3 %
Hematocrit: 47.1 % (ref 37.5–51.0)
Hemoglobin: 16.3 g/dL (ref 13.0–17.7)
Immature Grans (Abs): 0.1 10*3/uL (ref 0.0–0.1)
Immature Granulocytes: 3 %
Lymphocytes Absolute: 1.9 10*3/uL (ref 0.7–3.1)
Lymphs: 41 %
MCH: 30.5 pg (ref 26.6–33.0)
MCHC: 34.6 g/dL (ref 31.5–35.7)
MCV: 88 fL (ref 79–97)
Monocytes Absolute: 0.4 10*3/uL (ref 0.1–0.9)
Monocytes: 9 %
Neutrophils Absolute: 2 10*3/uL (ref 1.4–7.0)
Neutrophils: 43 %
Platelets: 175 10*3/uL (ref 150–450)
RBC: 5.35 x10E6/uL (ref 4.14–5.80)
RDW: 13.5 % (ref 11.6–15.4)
WBC: 4.6 10*3/uL (ref 3.4–10.8)

## 2022-03-17 ENCOUNTER — Ambulatory Visit: Payer: Medicare Other | Admitting: Physical Therapy

## 2022-03-17 ENCOUNTER — Encounter: Payer: Self-pay | Admitting: Physical Therapy

## 2022-03-17 ENCOUNTER — Ambulatory Visit (INDEPENDENT_AMBULATORY_CARE_PROVIDER_SITE_OTHER): Payer: Medicare Other | Admitting: Cardiology

## 2022-03-17 ENCOUNTER — Encounter: Payer: Self-pay | Admitting: Cardiology

## 2022-03-17 VITALS — BP 130/72 | HR 59 | Ht 70.0 in | Wt 288.8 lb

## 2022-03-17 DIAGNOSIS — G8929 Other chronic pain: Secondary | ICD-10-CM

## 2022-03-17 DIAGNOSIS — R269 Unspecified abnormalities of gait and mobility: Secondary | ICD-10-CM

## 2022-03-17 DIAGNOSIS — M25511 Pain in right shoulder: Secondary | ICD-10-CM | POA: Diagnosis not present

## 2022-03-17 DIAGNOSIS — R262 Difficulty in walking, not elsewhere classified: Secondary | ICD-10-CM

## 2022-03-17 DIAGNOSIS — Z01810 Encounter for preprocedural cardiovascular examination: Secondary | ICD-10-CM

## 2022-03-17 DIAGNOSIS — M6281 Muscle weakness (generalized): Secondary | ICD-10-CM

## 2022-03-17 DIAGNOSIS — E7800 Pure hypercholesterolemia, unspecified: Secondary | ICD-10-CM | POA: Diagnosis not present

## 2022-03-17 DIAGNOSIS — R2681 Unsteadiness on feet: Secondary | ICD-10-CM

## 2022-03-17 DIAGNOSIS — M25611 Stiffness of right shoulder, not elsewhere classified: Secondary | ICD-10-CM

## 2022-03-17 DIAGNOSIS — I10 Essential (primary) hypertension: Secondary | ICD-10-CM | POA: Diagnosis not present

## 2022-03-17 DIAGNOSIS — I4819 Other persistent atrial fibrillation: Secondary | ICD-10-CM | POA: Diagnosis not present

## 2022-03-17 NOTE — Telephone Encounter (Signed)
Patient seen in office today. Closing encounter.

## 2022-03-17 NOTE — Therapy (Signed)
?OUTPATIENT PHYSICAL THERAPY TREATMENT NOTE ? ? ?Patient Name: Tom Underwood ?MRN: [id number] ?DOB:Apr 12, 1951, 72 y.o., male ?Today's Date: 03/17/2022 ? ?PCP: Steele Sizer, MD ?REFERRING PROVIDER: Steele Sizer, MD ? ? PT End of Session - 03/17/22 1247   ? ? Visit Number 17   ? Number of Visits 24   ? Date for PT Re-Evaluation 05/05/22   ? Authorization Type Medicare A&B; generic commercial (visit limits based on medical necessity)   ? Authorization - Visit Number 7   ? Authorization - Number of Visits 10   ? PT Start Time 1247   ? PT Stop Time 1350   ? PT Time Calculation (min) 63 min   ? Equipment Utilized During Treatment Gait belt   ? Activity Tolerance Patient limited by fatigue   ? Behavior During Therapy [facility] for tasks assessed/performed   ? ?  ?  ? ?  ? ? ?Past Medical History:  ?Diagnosis Date  ? Allergy   ? Decreased libido   ? Diverticulitis   ? GERD (gastroesophageal reflux disease)   ? HLD (hyperlipidemia)   ? Hydronephrosis with renal and ureteral calculus obstruction   ? Hydronephrosis with renal and ureteral calculus obstruction   ? Hypertension   ? Hypogonadism in male   ? Rhinitis, allergic   ? Sleep apnea   ? Stroke [facility])   ? Ureteral stone   ? ?Past Surgical History:  ?Procedure Laterality Date  ? CARDIOVERSION N/A 12/08/2021  ? Procedure: CARDIOVERSION;  Surgeon: Nelva Bush, MD;  Location: [facility] ORS;  Service: Cardiovascular;  Laterality: N/A;  ? EXTERNAL EAR SURGERY    ? FINGER SURGERY    ? KNEE SURGERY    ? left eye    ? ?Patient Active Problem List  ? Diagnosis Date Noted  ? Hemorrhoid 10/18/2021  ? Obesity, Class III, BMI 40-49.9 (morbid obesity) ([place])   ? Atrial fibrillation with RVR ([place]) 07/14/2020  ? Persistent atrial fibrillation ([place]) 07/14/2020  ? Hypertension   ? Hemiparesis affecting right side as late effect of cerebrovascular accident ([place]) 08/13/2019  ? History of kidney stones 05/02/2018  ? ED (erectile dysfunction) 05/02/2018  ? Hyperglycemia 01/30/2017  ? Arthritis of  knee, degenerative 01/30/2017  ? BPH with obstruction/lower urinary tract symptoms 02/03/2016  ? Gross hematuria 02/03/2016  ? Dyslipidemia 08/04/2015  ? Diverticulitis of large intestine without perforation or abscess without bleeding 06/30/2015  ? Edema 06/30/2015  ? OSA on CPAP 06/30/2015  ? ? ?REFERRING DIAG: M25.511,M25.512 (ICD-10-CM) - Bilateral shoulder pain, unspecified chronicity ?M17.2 (ICD-10-CM) - Post-traumatic osteoarthritis of both knees  ?R26.81 (ICD-10-CM) - Gait instability  ? ? ?THERAPY DIAG:  ?Difficulty in walking, not elsewhere classified ? ?Muscle weakness (generalized) ? ?Unsteadiness on feet ? ?Abnormality of gait and mobility ? ?Decreased range of motion of right shoulder ? ?Chronic right shoulder pain ? ?PERTINENT HISTORY: See evaluation ? ?PRECAUTIONS: Fall ? ?SUBJECTIVE: Pt. Reports 6/10 R hip discomfort prior to tx. Session and 5/10 pain in B knees secondary to walking a lot this morning at hospital for MD appt.  Pt. Still dealing with internal hemorrhoid issues.   ? ?PAIN:  ?Are you having pain? Yes: NPRS scale: 6/10 ?Pain location: R hip ?Pain description: aching ?Aggravating factors: prolonged standing/ walking ?Relieving factors: rest ? ? ? ? ?TODAY'S TREATMENT:  ?Ther.ex.: ?  ?Nustep B UE/LE for 10 minutes L5 (consistent cadence).  ? ?Seated marching/ LAQ 20x.   ?  ?Nautilus seated lat pull down 60#, standing tricep extension 40#,  standing scap. Retraction 40# 15x2 each.  Reassessment of B shoulder AROM.   Seated rest break after UE resisted ex. ? ?Walking in //-bars (forward/ lateral/backwards)- high marching with mirror feedback.  Moderate cuing to slow down and correct upright posture.   ? ?Outside walking on varying terrain with consistent step pattern/ heel strike.  Moderate cuing for posture.  Slight R antalgic gait.   ?  ?Walking in hallway with cuing to correct arm swing and focus on turning at end of hallway.      ?  ?  ? ? ?PATIENT EDUCATION: ?Education details: HEP/  daily activity ?Person educated: Patient ?Education method: Explanation and Demonstration ?Education comprehension: verbalized understanding and returned demonstration ? ? ?HOME EXERCISE PROGRAM: ?Handouts given for shoulder ROM/ strengthening/ standing hip ex.  ? ? PT Short Term Goals -  ? ?  ? PT SHORT TERM GOAL #1  ? Title Pt will be indep with HEP to improve strength and balance to reduce risk of falls.   ? Baseline 11/23: initiated   ? Time 4   ? Period Weeks   ? Status Achieved   ? ?  ?  ? ?  ? ? ? PT Long Term Goals -  ? ?  ? PT LONG TERM GOAL #1  ? Title Pt will improve FOTO score to target to display improvement in functional mobility.   ? Baseline 11/23: 29/54   ? Time 8   ? Period Weeks   ? Status Partially Met   ? Target Date 05/05/22   ?  ? PT LONG TERM GOAL #2  ? Title Pt wil improve TUG score to < 12 sec with no AD to display significant imporvement in reduced risk of falls   ? Baseline 11/23: 26 sec (did not sit down immediately leading to higher time than he would've scored) 1/19: 14.44 sec   ? Time 8   ? Period Weeks   ? Status Partially Met   ? Target Date 05/05/22   ?  ? PT LONG TERM GOAL #3  ? Title Pt will improve DGI to >19/24 to indicate decreased risk of falls with household and community walking tasks   ? Baseline 11/23: 11/24 1/19:12/24   ? Time 8   ? Period Weeks   ? Status Partially Met   ? Target Date 05/05/22   ?  ? PT LONG TERM GOAL #4  ? Title Pt will improve 30 sec STS >12 reps to display clinically significnat improvement in LE strength/endurance for functional mobility.   ? Baseline 11/23: 7 reps 1/19: 10.5 reps   ? Time 8   ? Period Weeks   ? Status Partially Met   ? Target Date 05/05/22   ? ?  ?  ? ?  ? ? ? Plan - 03/13/22 1824   ? ? Clinical Impression Statement Pt. Required only 2 seated rest breaks during tx. Session after Nautilus ex. And outside walking.  Pt. remains motivated and works hard during generalized LE/UE strengthening ex. program. R ankle remains swollen but  improvement in color/ ecchymosis. No active drainage noted.  Pt. will continue to benefit from supervised PT so that he is able to progress LE dynamic exercises while monitoring his O2 sat rate. No LOB during tx. session.   ? Personal Factors and Comorbidities Comorbidity 2   ? Comorbidities Hypertension, Obesity   ? Examination-Activity Limitations Bed Mobility;Reach Overhead;Squat;Lift;Dressing;Hygiene/Grooming   ? Examination-Participation Restrictions Community Activity;Valla Leaver Work   ? Stability/Clinical  Decision Making Evolving/Moderate complexity   ? Clinical Decision Making Moderate   ? Rehab Potential Fair   ? PT Frequency 1x / week   ? PT Duration 8 weeks   ? PT Treatment/Interventions ADLs/Self Care Home Management;Gait training;Stair training;Functional mobility training;Therapeutic activities;Therapeutic exercise;Balance training;Neuromuscular re-education;Manual techniques;Patient/family education;Electrical Stimulation;Moist Heat;Cryotherapy   ? PT Next Visit Plan Progress LE strengthening.   ? PT Home Exercise Plan Shoulder pulleys in flex, scaption, abd, pendelums, scap retractions with resistance (RTB), D2 flexion pattern in sitting no resistance.   ? Consulted and Agree with Plan of Care Patient   ? ?  ?  ? ?  ? ? ? ?Pura Spice, PT, DPT # [phone] ?03/17/2022, 1:51 PM ? ?   ?

## 2022-03-17 NOTE — Progress Notes (Signed)
?Cardiology Office Note:   ? ?Date:  03/17/2022  ? ?ID:  Tom Underwood, DOB 1951-07-13, MRN [id number] ? ?PCP:  Alba Cory, MD  ?[facility] Cardiologist:  Debbe Odea, MD  ?[facility] Electrophysiologist:  Lanier Prude, MD  ? ?Referring MD: Alba Cory, MD  ? ?No chief complaint on file. ? ? ?History of Present Illness:   ? ?Tom Underwood is a 71 y.o. male with a hx of persistent A-fib (s/p DCCV 12/2021), hypertension, hyperlipidemia, CVA 07/2019 with right-sided weakness, OSA on CPAP presents for preop evaluation. ? ?Patient has noticed blood in stools, evaluated by gastroenterology, diagnosed with hemorrhoids.  Holding Xarelto recommended.  Colonoscopy being planned.  He denies palpitations.  Takes all medications as prescribed including Lopressor.  Working with physical therapy, lower extremity strength continues to improve. ? ? ?Prior notes ?Echocardiogram 07/15/2020 normal systolic function, EF 55 to 60%.  Normal diastolic function. ?Previously evaluated by EP, not a candidate for ablation due to OSA, obesity. ? ?Past Medical History:  ?Diagnosis Date  ? Allergy   ? Decreased libido   ? Diverticulitis   ? GERD (gastroesophageal reflux disease)   ? HLD (hyperlipidemia)   ? Hydronephrosis with renal and ureteral calculus obstruction   ? Hydronephrosis with renal and ureteral calculus obstruction   ? Hypertension   ? Hypogonadism in male   ? Rhinitis, allergic   ? Sleep apnea   ? Stroke [facility])   ? Ureteral stone   ? ? ?Past Surgical History:  ?Procedure Laterality Date  ? CARDIOVERSION N/A 12/08/2021  ? Procedure: CARDIOVERSION;  Surgeon: Yvonne Kendall, MD;  Location: [facility] ORS;  Service: Cardiovascular;  Laterality: N/A;  ? EXTERNAL EAR SURGERY    ? FINGER SURGERY    ? KNEE SURGERY    ? left eye    ? ? ?Current Medications: ?Current Meds  ?Medication Sig  ? acetaminophen (TYLENOL) 650 MG CR tablet Take 1,300 mg by mouth every evening.  ? amitriptyline (ELAVIL) 25 MG tablet TAKE 1  TABLET BY MOUTH AT BEDTIME AS NEEDED FOR SLEEP  ? buPROPion (WELLBUTRIN XL) 150 MG 24 hr tablet Take 1 tablet (150 mg total) by mouth every morning.  ? busPIRone (BUSPAR) 10 MG tablet Take 1 tablet (10 mg total) by mouth 2 (two) times daily.  ? cetirizine (ZYRTEC) 10 MG tablet Take 10 mg by mouth in the morning.   ? Cholecalciferol (VITAMIN D) 125 MCG (5000 UT) CAPS Take 5,000 Units by mouth daily.  ? fluticasone (FLONASE) 50 MCG/ACT nasal spray Place 2 sprays into both nostrils daily.  ? furosemide (LASIX) 20 MG tablet Take 1 tablet (20 mg total) by mouth as needed. For lower extremity swelling.  ? GLUCOSAMINE-CHONDROITIN PO Take 2 tablets by mouth in the morning and at bedtime.  ? hydrocortisone (ANUSOL-HC) 25 MG suppository Place 25 mg rectally 2 (two) times daily as needed for hemorrhoids or anal itching.  ? meloxicam (MOBIC) 15 MG tablet as needed.  ? metoprolol tartrate (LOPRESSOR) 50 MG tablet Take 0.5 tablets (25 mg total) by mouth 2 (two) times daily.  ? Multiple Vitamin (MULTIVITAMIN) tablet Take 1 tablet by mouth daily.  ? omega-3 acid ethyl esters (LOVAZA) 1 g capsule Take 2 capsules (2 g total) by mouth 2 (two) times daily.  ? pantoprazole (PROTONIX) 20 MG tablet Take 1 tablet (20 mg total) by mouth daily.  ? rivaroxaban (XARELTO) 20 MG TABS tablet Take 1 tablet (20 mg total) by mouth daily with supper.  ?  rosuvastatin (CRESTOR) 20 MG tablet Take 1 tablet (20 mg total) by mouth daily.  ? Saccharomyces boulardii (PROBIOTIC) 250 MG CAPS Take 1 capsule by mouth daily.  ? tamsulosin (FLOMAX) 0.4 MG CAPS capsule Take 1 capsule (0.4 mg total) by mouth daily.  ? valsartan (DIOVAN) 80 MG tablet Take 1 tablet (80 mg total) by mouth daily.  ? VISINE DRY EYE RELIEF 1 % SOLN Place 1 drop into both eyes daily as needed (dry eyes).  ?  ? ?Allergies:   Penicillins and Propofol  ? ?Social History  ? ?Socioeconomic History  ? Marital status: Married  ?  Spouse name: Tom Underwood   ? Number of children: 2  ? Years of education:  Not on file  ? Highest education level: Not on file  ?Occupational History  ? Occupation: retired   ?Tobacco Use  ? Smoking status: Former  ?  Types: Pipe  ?  Start date: 02/20/1973  ?  Quit date: 02/20/1974  ?  Years since quitting: 48.1  ? Smokeless tobacco: Never  ?Vaping Use  ? Vaping Use: Never used  ?Substance and Sexual Activity  ? Alcohol use: No  ?  Alcohol/week: 0.0 standard drinks  ? Drug use: No  ? Sexual activity: Yes  ?  Partners: Female  ?Other Topics Concern  ? Not on file  ?Social History Narrative  ? Not on file  ? ?Social Determinants of Health  ? ?Financial Resource Strain: Low Risk   ? Difficulty of Paying Living Expenses: Not hard at all  ?Food Insecurity: No Food Insecurity  ? Worried About Programme researcher, broadcasting/film/video in the Last Year: Never true  ? Ran Out of Food in the Last Year: Never true  ?Transportation Needs: No Transportation Needs  ? Lack of Transportation (Medical): No  ? Lack of Transportation (Non-Medical): No  ?Physical Activity: Insufficiently Active  ? Days of Exercise per Week: 4 days  ? Minutes of Exercise per Session: 10 min  ?Stress: Stress Concern Present  ? Feeling of Stress : To some extent  ?Social Connections: Socially Integrated  ? Frequency of Communication with Friends and Family: More than three times a week  ? Frequency of Social Gatherings with Friends and Family: Twice a week  ? Attends Religious Services: More than 4 times per year  ? Active Member of Clubs or Organizations: Yes  ? Attends Banker Meetings: More than 4 times per year  ? Marital Status: Married  ?  ? ?Family History: ?The patient's family history includes Asthma in his father and mother; Dementia in his mother; Heart attack in his mother; Heart disease in his father and mother; Stroke in his father. ? ?ROS:   ?Please see the history of present illness.    ? All other systems reviewed and are negative. ? ?EKGs/Labs/Other Studies Reviewed:   ? ?The following studies were reviewed  today: ? ? ?EKG:  EKG is ordered today.  EKG shows sinus bradycardia, heart rate 59. ? ?Recent Labs: ?05/28/2021: TSH 2.530 ?08/31/2021: ALT 26 ?11/15/2021: BUN 15; Creatinine, Ser 1.03; Potassium 4.2; Sodium 142 ?03/14/2022: Hemoglobin 16.3; Platelets 175  ?Recent Lipid Panel ?   ?Component Value Date/Time  ? CHOL 120 05/28/2021 1336  ? TRIG 79 05/28/2021 1336  ? HDL 51 05/28/2021 1336  ? CHOLHDL 2.4 05/28/2021 1336  ? LDLCALC 53 05/28/2021 1336  ? ? ? ?Risk Assessment/Calculations:   ? ? ? ?Physical Exam:   ? ?VS:  BP 130/72   Pulse Marland Kitchen)  59   Ht 5\' 10"  (1.778 m)   Wt 288 lb 12.8 oz (131 kg)   SpO2 94%   BMI 41.44 kg/m?    ? ?Wt Readings from Last 3 Encounters:  ?03/17/22 288 lb 12.8 oz (131 kg)  ?02/25/22 288 lb 3.2 oz (130.7 kg)  ?02/07/22 291 lb (132 kg)  ?  ? ?GEN:  Well nourished, well developed in no acute distress ?HEENT: Normal ?NECK: No JVD; No carotid bruits ?LYMPHATICS: No lymphadenopathy ?CARDIAC: Regular rate and rhythm, no murmurs ?RESPIRATORY:  Clear to auscultation without rales, wheezing or rhonchi  ?ABDOMEN: Soft, non-tender, non-distended ?MUSCULOSKELETAL: Trace edema; No deformity  ?SKIN: Warm and dry ?NEUROLOGIC:  Alert and oriented x 3 ?PSYCHIATRIC:  Normal affect  ? ?ASSESSMENT:   ? ?1. Pre-operative cardiovascular examination   ?2. Persistent atrial fibrillation (HCC)   ?3. Primary hypertension   ?4. Pure hypercholesterolemia   ? ? ?PLAN:   ? ?In order of problems listed above: ? ?Preop cardiovascular evaluation, hemorrhoidal bleeding, proceed being planned by gastroenterology.  Okay to hold Xarelto for procedure.  Restart as soon as safely possible after as per GI.  Discussed risk benefits of stopping Xarelto with patient.  These risk obviously include possibility of stroke, patient fully understands, is willing to proceed. ?Atrial fibrillation, persistent, s/p DCCV 12/2021.  Maintaining sinus rhythm.  CHA2DS2-VASc score 4 (Age, htn, CVA). continue Xarelto 20 mg daily, Lopressor 25 mg twice  daily. ?hypertension, BP controlled.  Continue Lopressor, valsartan. ?hyperlipidemia, cholesterol controlled, continue statin, lovaza. ? ?Follow-up in 6 months. ? ?  ? ?Medication Adjustments/Labs and Tests Ordered: ?Cur

## 2022-03-17 NOTE — Patient Instructions (Signed)
Medication Instructions:  ? ?Your physician recommends that you continue on your current medications as directed. Please refer to the Current Medication list given to you today. ? ?** OKAY to hold Xarelto for procedure, restart as soon as possible as surgeon recommends. ? ?*If you need a refill on your cardiac medications before your next appointment, please call your pharmacy* ? ? ?Lab Work: ? ?None ordered ? ?If you have labs (blood work) drawn today and your tests are completely normal, you will receive your results only by: ?MyChart Message (if you have MyChart) OR ?A paper copy in the mail ?If you have any lab test that is abnormal or we need to change your treatment, we will call you to review the results. ? ? ?Testing/Procedures: ? ?None ordered ? ? ?Follow-Up: ?At [facility], you and your health needs are our priority.  As part of our continuing mission to provide you with exceptional heart care, we have created designated Provider Care Teams.  These Care Teams include your primary Cardiologist (physician) and Advanced Practice Providers (APPs -  Physician Assistants and Nurse Practitioners) who all work together to provide you with the care you need, when you need it. ? ?We recommend signing up for the patient portal called "MyChart".  Sign up information is provided on this After Visit Summary.  MyChart is used to connect with patients for Virtual Visits (Telemedicine).  Patients are able to view lab/test results, encounter notes, upcoming appointments, etc.  Non-urgent messages can be sent to your provider as well.   ?To learn more about what you can do with MyChart, go to [email or web address].   ? ?Your next appointment:   ?6 month(s) ? ?The format for your next appointment:   ?In Person ? ?Provider:   ?You may see Debbe Odea, MD or one of the following Advanced Practice Providers on your designated Care Team:   ?Nicolasa Ducking, NP ?Eula Listen, PA-C ?Cadence Fransico [name], PA-C  ? ? ?Other  Instructions ? ? ?Important Information About Sugar ? ? ? ? ? ? ?

## 2022-03-21 ENCOUNTER — Ambulatory Visit (INDEPENDENT_AMBULATORY_CARE_PROVIDER_SITE_OTHER): Payer: Medicare Other | Admitting: Psychologist

## 2022-03-21 DIAGNOSIS — F320 Major depressive disorder, single episode, mild: Secondary | ICD-10-CM | POA: Diagnosis not present

## 2022-03-21 DIAGNOSIS — Z634 Disappearance and death of family member: Secondary | ICD-10-CM
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
## 2022-03-24 ENCOUNTER — Ambulatory Visit: Payer: Medicare Other | Admitting: Physical Therapy

## 2022-03-25 ENCOUNTER — Ambulatory Visit: Payer: Medicare Other | Admitting: Physical Therapy

## 2022-03-25 DIAGNOSIS — G8929 Other chronic pain: Secondary | ICD-10-CM

## 2022-03-25 DIAGNOSIS — M6281 Muscle weakness (generalized): Secondary | ICD-10-CM

## 2022-03-25 DIAGNOSIS — R2681 Unsteadiness on feet: Secondary | ICD-10-CM | POA: Diagnosis not present

## 2022-03-25 DIAGNOSIS — M25511 Pain in right shoulder: Secondary | ICD-10-CM | POA: Diagnosis not present

## 2022-03-25 DIAGNOSIS — M25611 Stiffness of right shoulder, not elsewhere classified: Secondary | ICD-10-CM

## 2022-03-25 DIAGNOSIS — R269 Unspecified abnormalities of gait and mobility: Secondary | ICD-10-CM

## 2022-03-25 DIAGNOSIS — R262 Difficulty in walking, not elsewhere classified: Secondary | ICD-10-CM | POA: Diagnosis not present

## 2022-03-25 DIAGNOSIS — R2689 Other abnormalities of gait and mobility: Secondary | ICD-10-CM

## 2022-03-26 NOTE — Therapy (Signed)
?OUTPATIENT PHYSICAL THERAPY TREATMENT NOTE ? ? ?Patient Name: Tom Underwood ?MRN: [id number] ?DOB:[birth year], 71 y.o., male ?Today's Date: 03/26/2022 ? ?PCP: Steele Sizer, MD ?REFERRING PROVIDER: Steele Sizer, MD ? ? PT End of Session - 03/26/22 1826   ? ? Visit Number 18   ? Number of Visits 24   ? Date for PT Re-Evaluation 05/05/22   ? Authorization Type Medicare A&B; generic commercial (visit limits based on medical necessity)   ? Authorization - Visit Number 8   ? Authorization - Number of Visits 10   ? PT Start Time 1401   ? PT Stop Time 1512   ? PT Time Calculation (min) 71 min   ? Equipment Utilized During Treatment Gait belt   ? Activity Tolerance Patient limited by fatigue;Patient tolerated treatment well   ? Behavior During Therapy [facility] for tasks assessed/performed   ? ?  ?  ? ?  ? ? ?Past Medical History:  ?Diagnosis Date  ? Allergy   ? Decreased libido   ? Diverticulitis   ? GERD (gastroesophageal reflux disease)   ? HLD (hyperlipidemia)   ? Hydronephrosis with renal and ureteral calculus obstruction   ? Hydronephrosis with renal and ureteral calculus obstruction   ? Hypertension   ? Hypogonadism in male   ? Rhinitis, allergic   ? Sleep apnea   ? Stroke [facility])   ? Ureteral stone   ? ?Past Surgical History:  ?Procedure Laterality Date  ? CARDIOVERSION N/A 12/08/2021  ? Procedure: CARDIOVERSION;  Surgeon: Nelva Bush, MD;  Location: [facility] ORS;  Service: Cardiovascular;  Laterality: N/A;  ? EXTERNAL EAR SURGERY    ? FINGER SURGERY    ? KNEE SURGERY    ? left eye    ? ?Patient Active Problem List  ? Diagnosis Date Noted  ? Hemorrhoid 10/18/2021  ? Obesity, Class III, BMI 40-49.9 (morbid obesity) ([place])   ? Atrial fibrillation with RVR ([place]) 07/14/2020  ? Persistent atrial fibrillation ([place]) 07/14/2020  ? Hypertension   ? Hemiparesis affecting right side as late effect of cerebrovascular accident ([place]) 08/13/2019  ? History of kidney stones 05/02/2018  ? ED (erectile dysfunction) 05/02/2018  ?  Hyperglycemia 01/30/2017  ? Arthritis of knee, degenerative 01/30/2017  ? BPH with obstruction/lower urinary tract symptoms 02/03/2016  ? Gross hematuria 02/03/2016  ? Dyslipidemia 08/04/2015  ? Diverticulitis of large intestine without perforation or abscess without bleeding 06/30/2015  ? Edema 06/30/2015  ? OSA on CPAP 06/30/2015  ? ? ?REFERRING DIAG: M25.511,M25.512 (ICD-10-CM) - Bilateral shoulder pain, unspecified chronicity ?M17.2 (ICD-10-CM) - Post-traumatic osteoarthritis of both knees  ?R26.81 (ICD-10-CM) - Gait instability  ? ? ?THERAPY DIAG:  ?Difficulty in walking, not elsewhere classified ? ?Muscle weakness (generalized) ? ?Unsteadiness on feet ? ?Abnormality of gait and mobility ? ?Decreased range of motion of right shoulder ? ?Chronic right shoulder pain ? ?Other abnormalities of gait and mobility ? ?PERTINENT HISTORY: See evaluation ? ?PRECAUTIONS: Fall ? ?SUBJECTIVE: Pt. Reports 5/10 pain in B knees this afternoon. Pt. Has appt. To have internal hemorrhoids removed on 04/15/22.  Pt. Continues to have a band aide over R ankle due to drainage (improving).   ? ?PAIN:  ?Are you having pain? Yes: NPRS scale: 6/10 ?Pain location: R hip ?Pain description: aching ?Aggravating factors: prolonged standing/ walking ?Relieving factors: rest ? ? ? ? ?TODAY'S TREATMENT:  ?Ther.ex.: ?  ?Nustep B UE/LE for 10+ minutes L5 (consistent cadence)- 0.64 miles  ? ?Walking in //-bars (forward/ lateral/backwards)- high marching with  mirror feedback.  Moderate cuing to slow down and correct upright posture.   ? ?Walking with alt. UE/LE touches with consistent recip. Pattern.  Mirror feedback/ cuing to slow down.   ? ?Seated marching/ LAQ 20x.   ?  ?Nautilus seated lat pull down 60#, standing tricep extension 40#, standing scap. Retraction 40# 15x2 each.  Reassessment of B shoulder AROM.   Seated rest break after UE resisted ex. ? ?Seated B shoulder AROM (flexion/ abduction)- 10x each.  Discussed household tasks/ overhead  reaching.   ? ?Outside walking on varying terrain with consistent step pattern/ heel strike.  Moderate cuing for posture.  Slight R antalgic gait.   ?  ?Walking around PT clinic with cuing to correct arm swing and focus on turning at end of hallway.      ?  ?  ? ? ?PATIENT EDUCATION: ?Education details: HEP/ daily activity ?Person educated: Patient ?Education method: Explanation and Demonstration ?Education comprehension: verbalized understanding and returned demonstration ? ? ?HOME EXERCISE PROGRAM: ?Handouts given for shoulder ROM/ strengthening/ standing hip ex.  ? ? PT Short Term Goals -  ? ?  ? PT SHORT TERM GOAL #1  ? Title Pt will be indep with HEP to improve strength and balance to reduce risk of falls.   ? Baseline 11/23: initiated   ? Time 4   ? Period Weeks   ? Status Achieved   ? ?  ?  ? ?  ? ? ? PT Long Term Goals -  ? ?  ? PT LONG TERM GOAL #1  ? Title Pt will improve FOTO score to target to display improvement in functional mobility.   ? Baseline 11/23: 29/54   ? Time 8   ? Period Weeks   ? Status Partially Met   ? Target Date 05/05/22   ?  ? PT LONG TERM GOAL #2  ? Title Pt wil improve TUG score to < 12 sec with no AD to display significant imporvement in reduced risk of falls   ? Baseline 11/23: 26 sec (did not sit down immediately leading to higher time than he would've scored) 1/19: 14.44 sec   ? Time 8   ? Period Weeks   ? Status Partially Met   ? Target Date 05/05/22   ?  ? PT LONG TERM GOAL #3  ? Title Pt will improve DGI to >19/24 to indicate decreased risk of falls with household and community walking tasks   ? Baseline 11/23: 11/24 1/19:12/24   ? Time 8   ? Period Weeks   ? Status Partially Met   ? Target Date 05/05/22   ?  ? PT LONG TERM GOAL #4  ? Title Pt will improve 30 sec STS >12 reps to display clinically significnat improvement in LE strength/endurance for functional mobility.   ? Baseline 11/23: 7 reps 1/19: 10.5 reps   ? Time 8   ? Period Weeks   ? Status Partially Met   ? Target Date  05/05/22   ? ?  ?  ? ?  ? ? ? Plan - 03/13/22 1824   ? ? Clinical Impression Statement Pt. Required 3 seated rest breaks during tx. Session and 1 bathroom break.  Pt. remains motivated and works hard during generalized LE/UE strengthening ex. program. R ankle remains swollen but improvement in color/ ecchymosis. No active drainage noted.  Moderate cuing to correct posture/ arm swing during gait in gym/ hallway.  Pt. will continue to benefit from supervised  PT so that he is able to progress LE dynamic exercises while monitoring his O2 sat rate.   ? Personal Factors and Comorbidities Comorbidity 2   ? Comorbidities Hypertension, Obesity   ? Examination-Activity Limitations Bed Mobility;Reach Overhead;Squat;Lift;Dressing;Hygiene/Grooming   ? Examination-Participation Restrictions Community Activity;Valla Leaver Work   ? Stability/Clinical Decision Making Evolving/Moderate complexity   ? Clinical Decision Making Moderate   ? Rehab Potential Fair   ? PT Frequency 1x / week   ? PT Duration 8 weeks   ? PT Treatment/Interventions ADLs/Self Care Home Management;Gait training;Stair training;Functional mobility training;Therapeutic activities;Therapeutic exercise;Balance training;Neuromuscular re-education;Manual techniques;Patient/family education;Electrical Stimulation;Moist Heat;Cryotherapy   ? PT Next Visit Plan Progress LE strengthening.   ? PT Home Exercise Plan Shoulder pulleys in flex, scaption, abd, pendelums, scap retractions with resistance (RTB), D2 flexion pattern in sitting no resistance.   ? Consulted and Agree with Plan of Care Patient   ? ?  ?  ? ?  ? ? ? ?Pura Spice, PT, DPT # [phone] ?03/26/2022, 6:28 PM ? ?   ?

## 2022-03-31 ENCOUNTER — Ambulatory Visit: Payer: Medicare Other | Admitting: Physical Therapy

## 2022-03-31 DIAGNOSIS — Z23 Encounter for immunization: Secondary | ICD-10-CM | POA: Diagnosis not present

## 2022-03-31 NOTE — Therapy (Incomplete)
?OUTPATIENT PHYSICAL THERAPY TREATMENT NOTE ? ? ?Patient Name: Tom Underwood ?MRN: [id number] ?DOB:[birth year], 71 y.o., male ?Today's Date: 03/31/2022 ? ?PCP: Steele Sizer, MD ?REFERRING PROVIDER: Steele Sizer, MD ? ? ? ? ?Past Medical History:  ?Diagnosis Date  ? Allergy   ? Decreased libido   ? Diverticulitis   ? GERD (gastroesophageal reflux disease)   ? HLD (hyperlipidemia)   ? Hydronephrosis with renal and ureteral calculus obstruction   ? Hydronephrosis with renal and ureteral calculus obstruction   ? Hypertension   ? Hypogonadism in male   ? Rhinitis, allergic   ? Sleep apnea   ? Stroke [facility])   ? Ureteral stone   ? ?Past Surgical History:  ?Procedure Laterality Date  ? CARDIOVERSION N/A 12/08/2021  ? Procedure: CARDIOVERSION;  Surgeon: Nelva Bush, MD;  Location: [facility] ORS;  Service: Cardiovascular;  Laterality: N/A;  ? EXTERNAL EAR SURGERY    ? FINGER SURGERY    ? KNEE SURGERY    ? left eye    ? ?Patient Active Problem List  ? Diagnosis Date Noted  ? Hemorrhoid 10/18/2021  ? Obesity, Class III, BMI 40-49.9 (morbid obesity) ([place])   ? Atrial fibrillation with RVR ([place]) 07/14/2020  ? Persistent atrial fibrillation ([place]) 07/14/2020  ? Hypertension   ? Hemiparesis affecting right side as late effect of cerebrovascular accident ([place]) 08/13/2019  ? History of kidney stones 05/02/2018  ? ED (erectile dysfunction) 05/02/2018  ? Hyperglycemia 01/30/2017  ? Arthritis of knee, degenerative 01/30/2017  ? BPH with obstruction/lower urinary tract symptoms 02/03/2016  ? Gross hematuria 02/03/2016  ? Dyslipidemia 08/04/2015  ? Diverticulitis of large intestine without perforation or abscess without bleeding 06/30/2015  ? Edema 06/30/2015  ? OSA on CPAP 06/30/2015  ? ? ?REFERRING DIAG: M25.511,M25.512 (ICD-10-CM) - Bilateral shoulder pain, unspecified chronicity ?M17.2 (ICD-10-CM) - Post-traumatic osteoarthritis of both knees  ?R26.81 (ICD-10-CM) - Gait instability  ? ? ?THERAPY DIAG:  ?No diagnosis  found. ? ?PERTINENT HISTORY: See evaluation ? ?PRECAUTIONS: Fall ? ?SUBJECTIVE:  ?*** ? ?Pt. Reports 5/10 pain in B knees this afternoon. Pt. Has appt. To have internal hemorrhoids removed on 04/15/22.  Pt. Continues to have a band aide over R ankle due to drainage (improving).   ? ?PAIN:  ?Are you having pain? Yes: NPRS scale: 6/10 ?Pain location: R hip ?Pain description: aching ?Aggravating factors: prolonged standing/ walking ?Relieving factors: rest ? ? ? ? ?TODAY'S TREATMENT:  ? ?03/31/2022: ? ? ? ? ?03/25/2022: ?Ther.ex.: ?  ?Nustep B UE/LE for 10+ minutes L5 (consistent cadence)- 0.64 miles  ? ?Walking in //-bars (forward/ lateral/backwards)- high marching with mirror feedback.  Moderate cuing to slow down and correct upright posture.   ? ?Walking with alt. UE/LE touches with consistent recip. Pattern.  Mirror feedback/ cuing to slow down.   ? ?Seated marching/ LAQ 20x.   ?  ?Nautilus seated lat pull down 60#, standing tricep extension 40#, standing scap. Retraction 40# 15x2 each.  Reassessment of B shoulder AROM.   Seated rest break after UE resisted ex. ? ?Seated B shoulder AROM (flexion/ abduction)- 10x each.  Discussed household tasks/ overhead reaching.   ? ?Outside walking on varying terrain with consistent step pattern/ heel strike.  Moderate cuing for posture.  Slight R antalgic gait.   ?  ?Walking around PT clinic with cuing to correct arm swing and focus on turning at end of hallway.      ?  ?  ? ? ?PATIENT EDUCATION: ?Education details: HEP/ daily activity ?  Person educated: Patient ?Education method: Explanation and Demonstration ?Education comprehension: verbalized understanding and returned demonstration ? ? ?HOME EXERCISE PROGRAM: ?Handouts given for shoulder ROM/ strengthening/ standing hip ex.  ? ? PT Short Term Goals -  ? ?  ? PT SHORT TERM GOAL #1  ? Title Pt will be indep with HEP to improve strength and balance to reduce risk of falls.   ? Baseline 11/23: initiated   ? Time 4   ? Period Weeks    ? Status Achieved   ? ?  ?  ? ?  ? ? ? PT Long Term Goals -  ? ?  ? PT LONG TERM GOAL #1  ? Title Pt will improve FOTO score to target to display improvement in functional mobility.   ? Baseline 11/23: 29/54   ? Time 8   ? Period Weeks   ? Status Partially Met   ? Target Date 05/05/22   ?  ? PT LONG TERM GOAL #2  ? Title Pt wil improve TUG score to < 12 sec with no AD to display significant imporvement in reduced risk of falls   ? Baseline 11/23: 26 sec (did not sit down immediately leading to higher time than he would've scored) 1/19: 14.44 sec   ? Time 8   ? Period Weeks   ? Status Partially Met   ? Target Date 05/05/22   ?  ? PT LONG TERM GOAL #3  ? Title Pt will improve DGI to >19/24 to indicate decreased risk of falls with household and community walking tasks   ? Baseline 11/23: 11/24 1/19:12/24   ? Time 8   ? Period Weeks   ? Status Partially Met   ? Target Date 05/05/22   ?  ? PT LONG TERM GOAL #4  ? Title Pt will improve 30 sec STS >12 reps to display clinically significnat improvement in LE strength/endurance for functional mobility.   ? Baseline 11/23: 7 reps 1/19: 10.5 reps   ? Time 8   ? Period Weeks   ? Status Partially Met   ? Target Date 05/05/22   ? ?  ?  ? ?  ? ? ? Plan - 03/13/22 1824   ? ? Clinical Impression Statement **** ? ? ? ?Pt. Required 3 seated rest breaks during tx. Session and 1 bathroom break.  Pt. remains motivated and works hard during generalized LE/UE strengthening ex. program. R ankle remains swollen but improvement in color/ ecchymosis. No active drainage noted.  Moderate cuing to correct posture/ arm swing during gait in gym/ hallway.  Pt. will continue to benefit from supervised PT so that he is able to progress LE dynamic exercises while monitoring his O2 sat rate.   ? Personal Factors and Comorbidities Comorbidity 2   ? Comorbidities Hypertension, Obesity   ? Examination-Activity Limitations Bed Mobility;Reach Overhead;Squat;Lift;Dressing;Hygiene/Grooming   ?  Examination-Participation Restrictions Community Activity;Valla Leaver Work   ? Stability/Clinical Decision Making Evolving/Moderate complexity   ? Clinical Decision Making Moderate   ? Rehab Potential Fair   ? PT Frequency 1x / week   ? PT Duration 8 weeks   ? PT Treatment/Interventions ADLs/Self Care Home Management;Gait training;Stair training;Functional mobility training;Therapeutic activities;Therapeutic exercise;Balance training;Neuromuscular re-education;Manual techniques;Patient/family education;Electrical Stimulation;Moist Heat;Cryotherapy   ? PT Next Visit Plan Progress LE strengthening.   ? PT Home Exercise Plan Shoulder pulleys in flex, scaption, abd, pendelums, scap retractions with resistance (RTB), D2 flexion pattern in sitting no resistance.   ? Consulted and Agree with  Plan of Care Patient   ? ?  ?  ? ?  ? ? ?*** ?03/31/2022, 1:24 PM ? ?   ?

## 2022-04-04 ENCOUNTER — Ambulatory Visit: Payer: Self-pay | Admitting: Surgery

## 2022-04-04 NOTE — H&P (Signed)
Subjective:  ?CC: Perianal venous thrombosis [K64.5]  ? ?HPI: ?Tom Underwood is a 70 y.o. male who returns for above. Another episode of bleeding soaking through pads a few days ago. Still on xarelto ? ?Past Medical History: has a past medical history of Arthritis, History of stroke, Hyperlipidemia, Hypertension, Sleep apnea, and Tremor. ? ?Past Surgical History: has a past surgical history that includes finger surgery; knee surgery; left eye surgery (Left); and external ear surgery. ? ?Family History: family history includes Asthma in his father and mother; Heart disease in his father and mother; Myocardial Infarction (Heart attack) in his father and mother; Stroke in his father and mother. ? ?Social History: reports that he has quit smoking. His smoking use included pipe and cigarettes. He has never used smokeless tobacco. He reports that he does not drink alcohol and does not use drugs. ? ?Current Medications: has a current medication list which includes the following prescription(s): acetaminophen, amitriptyline, bupropion, buspirone, cetirizine, furosemide, glucosam/chond-msm1/c/mang/bor, hydrocortisone, meloxicam, methocarbamol, metoprolol tartrate, multivitamin, omega-3 acid ethyl esters, pantoprazole, saccharomyces boulardii, sildenafil, tamsulosin, valsartan, ergocalciferol (vitamin d2), xarelto, metoprolol tartrate, and rosuvastatin. ? ?Allergies:  ?Allergies as of 03/09/2022 - Reviewed 03/09/2022  ?Allergen Reaction Noted  ? Penicillins Itching 06/30/2015  ? Propofol Other (See Comments) 12/08/2021  ? ?ROS:  ?A 15 point review of systems was performed and pertinent positives and negatives noted in HPI ? ?Objective:  ? ? ?Ht 177.8 cm (5' 10")  Wt (!) 132.9 kg (293 lb)  BMI 42.04 kg/m?  ? ?Constitutional : No distress, cooperative, alert  ?Lymphatics/Throat: Supple with no lymphadenopathy  ?Respiratory: Clear to auscultation bilaterally  ?Cardiovascular: Regular rate and rhythm  ?Gastrointestinal: Soft,  non-tender, non-distended, no organomegaly.  ?Musculoskeletal: Steady gait and movement  ?Skin: Cool and moist.  ?Psychiatric: Normal affect, non-agitated, not confused  ?Rectal: External exam positive for non-tender thrombosed hemorrhoid with evidence of recent bleeding but no active bleeding at time of exam. Size of quarter.  ? ? ?LABS:  ?N/a  ? ?RADS: ?n/a ?Assessment:  ? ? ?Perianal venous thrombosis [K64.5] , recurrent. Bleeding episode noted to be significant.  ?Plan:  ? ?1. Perianal venous thrombosis [K64.5] Discussed risks/benefits/alternatives to surgery. Alternatives include the options of observation, medical management. Benefits include symptomatic relief. I discussed in detail and the complications related to the operation and the anesthesia, including bleeding, infection, recurrence, remote possibility of temporary or permanent fecal incontinence, poor/delayed wound healing, chronic pain, and additional procedures to address said risks. The risks of general anesthetic, if used, includes MI, CVA, sudden death or even reaction to anesthetic medications also discussed.  ?We also discussed typical post operative recovery which includes weeks to potentially months of anal pain, drainage, occasional bleeding, and sense of fecal urgency.  ? ?ED return precautions given for sudden increase in pain, bleeding, with possible accompanying fever, nausea, and/or vomiting. ? ?The patient understands the risks, any and all questions were answered to the patient's satisfaction. ? ?2. Patient still hesitant on proceeding with hemorrhoidectomy due to associated recovery. Recommended he still ask to stop eliquis per prescribing physician for a few days until clot can potentially resolve on its own. Resume as deemed safe to do so by prescribing provider as well. He will call back when ready to schedule. No need to be seen again prior.  ? ?eliquis will need to be held 2 days prior if surgery scheduled.  ?

## 2022-04-04 NOTE — H&P (View-Only) (Signed)
Subjective:  ?CC: Perianal venous thrombosis [K64.5]  ? ?HPI: ?Tom Underwood is a 71 y.o. male who returns for above. Another episode of bleeding soaking through pads a few days ago. Still on xarelto ? ?Past Medical History: has a past medical history of Arthritis, History of stroke, Hyperlipidemia, Hypertension, Sleep apnea, and Tremor. ? ?Past Surgical History: has a past surgical history that includes finger surgery; knee surgery; left eye surgery (Left); and external ear surgery. ? ?Family History: family history includes Asthma in his father and mother; Heart disease in his father and mother; Myocardial Infarction (Heart attack) in his father and mother; Stroke in his father and mother. ? ?Social History: reports that he has quit smoking. His smoking use included pipe and cigarettes. He has never used smokeless tobacco. He reports that he does not drink alcohol and does not use drugs. ? ?Current Medications: has a current medication list which includes the following prescription(s): acetaminophen, amitriptyline, bupropion, buspirone, cetirizine, furosemide, glucosam/chond-msm1/c/mang/bor, hydrocortisone, meloxicam, methocarbamol, metoprolol tartrate, multivitamin, omega-3 acid ethyl esters, pantoprazole, saccharomyces boulardii, sildenafil, tamsulosin, valsartan, ergocalciferol (vitamin d2), xarelto, metoprolol tartrate, and rosuvastatin. ? ?Allergies:  ?Allergies as of 03/09/2022 - Reviewed 03/09/2022  ?Allergen Reaction Noted  ? Penicillins Itching 06/30/2015  ? Propofol Other (See Comments) 12/08/2021  ? ?ROS:  ?A 15 point review of systems was performed and pertinent positives and negatives noted in HPI ? ?Objective:  ? ? ?Ht 177.8 cm (5\' 10" )  Wt (!) 132.9 kg (293 lb)  BMI 42.04 kg/m?  ? ?Constitutional : No distress, cooperative, alert  ?Lymphatics/Throat: Supple with no lymphadenopathy  ?Respiratory: Clear to auscultation bilaterally  ?Cardiovascular: Regular rate and rhythm  ?Gastrointestinal: Soft,  non-tender, non-distended, no organomegaly.  ?Musculoskeletal: Steady gait and movement  ?Skin: Cool and moist.  ?Psychiatric: Normal affect, non-agitated, not confused  ?Rectal: External exam positive for non-tender thrombosed hemorrhoid with evidence of recent bleeding but no active bleeding at time of exam. Size of quarter.  ? ? ?LABS:  ?N/a  ? ?RADS: ?n/a ?Assessment:  ? ? ?Perianal venous thrombosis [K64.5] , recurrent. Bleeding episode noted to be significant.  ?Plan:  ? ?1. Perianal venous thrombosis [K64.5] Discussed risks/benefits/alternatives to surgery. Alternatives include the options of observation, medical management. Benefits include symptomatic relief. I discussed in detail and the complications related to the operation and the anesthesia, including bleeding, infection, recurrence, remote possibility of temporary or permanent fecal incontinence, poor/delayed wound healing, chronic pain, and additional procedures to address said risks. The risks of general anesthetic, if used, includes MI, CVA, sudden death or even reaction to anesthetic medications also discussed.  ?We also discussed typical post operative recovery which includes weeks to potentially months of anal pain, drainage, occasional bleeding, and sense of fecal urgency.  ? ?ED return precautions given for sudden increase in pain, bleeding, with possible accompanying fever, nausea, and/or vomiting. ? ?The patient understands the risks, any and all questions were answered to the patient's satisfaction. ? ?2. Patient still hesitant on proceeding with hemorrhoidectomy due to associated recovery. Recommended he still ask to stop eliquis per prescribing physician for a few days until clot can potentially resolve on its own. Resume as deemed safe to do so by prescribing provider as well. He will call back when ready to schedule. No need to be seen again prior.  ? ?eliquis will need to be held 2 days prior if surgery scheduled.  ?

## 2022-04-07 ENCOUNTER — Other Ambulatory Visit: Payer: Self-pay | Admitting: *Deleted

## 2022-04-07 ENCOUNTER — Encounter: Payer: Medicare Other | Admitting: Physical Therapy

## 2022-04-07 ENCOUNTER — Other Ambulatory Visit: Payer: Self-pay

## 2022-04-07 ENCOUNTER — Other Ambulatory Visit
Admission: RE | Admit: 2022-04-07 | Discharge: 2022-04-07 | Disposition: A | Discharge status: Home / Self Care | Payer: Medicare Other | Source: Ambulatory Visit | Attending: Surgery | Admitting: Surgery

## 2022-04-07 DIAGNOSIS — Z01812 Encounter for preprocedural laboratory examination: Secondary | ICD-10-CM

## 2022-04-07 HISTORY — DX: Other complications of anesthesia, initial encounter: T88.59XA

## 2022-04-07 HISTORY — DX: Unspecified osteoarthritis, unspecified site: M19.90

## 2022-04-07 HISTORY — DX: Prediabetes: R73.03

## 2022-04-07 HISTORY — DX: Cardiac murmur, unspecified: R01.1

## 2022-04-07 HISTORY — DX: Depression, unspecified: F32.A

## 2022-04-07 HISTORY — DX: Cardiac arrhythmia, unspecified: I49.9

## 2022-04-07 HISTORY — DX: Personal history of urinary calculi: Z87.442

## 2022-04-07 HISTORY — DX: Anxiety disorder, unspecified: F41.9

## 2022-04-07 MED ORDER — METOPROLOL TARTRATE 50 MG PO TABS: 25.0000 mg | ORAL_TABLET | Freq: Two times a day (BID) | ORAL | 2 refills | Status: DC

## 2022-04-07 NOTE — Patient Instructions (Addendum)
Your procedure is scheduled on: 04/15/22 - Friday ?Report to the Registration Desk on the 1st floor of the [facility]. ?To find out your arrival time, please call [phone] between 1PM - 3PM on: 04/14/22 - Thursday ?If your arrival time is 6:00 am, do not arrive prior to that time as the [facility] entrance doors do not open until 6:00 am. ? ?REMEMBER: ?Instructions that are not followed completely may result in serious medical risk, up to and including death; or upon the discretion of your surgeon and anesthesiologist your surgery may need to be rescheduled. ? ?Do not eat food after midnight the night before surgery.  ?No gum chewing, lozengers or hard candies. ? ?You may however, drink CLEAR liquids up to 2 hours before you are scheduled to arrive for your surgery. Do not drink anything within 2 hours of your scheduled arrival time. ? ?Clear liquids include: ?- water  ?- apple juice without pulp ?- gatorade (not RED colors) ?- black coffee or tea (Do NOT add milk or creamers to the coffee or tea) ?Do NOT drink anything that is not on this list. ? ?TAKE ONLY THESE MEDICATIONS THE MORNING OF SURGERY WITH A SIP OF WATER: ? ?- buPROPion (WELLBUTRIN XL) 150 MG 24 hr tablet ?- busPIRone (BUSPAR) 10 MG tablet ?- metoprolol tartrate (LOPRESSOR) 50 MG tablet ?- pantoprazole (PROTONIX) 20 MG tablet, (take one the night before and one on the morning of surgery - helps to prevent nausea after surgery.) ?- tamsulosin (FLOMAX) 0.4 MG CAPS capsule ? ?One week prior to surgery stop beginning 04/08/22 :meloxicam (MOBIC) 15 MG tablet ?Stop Anti-inflammatories (NSAIDS) such as Advil, Aleve, Ibuprofen, Motrin, Naproxen, Naprosyn and Aspirin based products such as Excedrin, Goodys Powder, BC Powder. ? ?Stop taking rivaroxaban (XARELTO) 20 MG TABS tablet 2 days prior to surgery beginning 04/13/22. ? ?Stop ANY OVER THE COUNTER supplements until after surgery beginning 04/08/22: GLUCOSAMINE-CHONDROITIN PO, Cholecalciferol  (VITAMIN D) 125 MCG (5000 UT) CAPS, Multiple Vitamin (MULTIVITAMIN) tablet. ? ?You may take Tylenol if needed for pain up until the day of surgery. ? ?No Alcohol for 24 hours before or after surgery. ? ?No Smoking including e-cigarettes for 24 hours prior to surgery.  ?No chewable tobacco products for at least 6 hours prior to surgery.  ?No nicotine patches on the day of surgery. ? ?Do not use any "recreational" drugs for at least a week prior to your surgery.  ?Please be advised that the combination of cocaine and anesthesia may have negative outcomes, up to and including death. ?If you test positive for cocaine, your surgery will be cancelled. ? ?On the morning of surgery brush your teeth with toothpaste and water, you may rinse your mouth with mouthwash if you wish. ?Do not swallow any toothpaste or mouthwash. ? ?Do not wear jewelry, make-up, hairpins, clips or nail polish. ? ?Do not wear lotions, powders, or perfumes.  ? ?Do not shave body from the neck down 48 hours prior to surgery just in case you cut yourself which could leave a site for infection.  ?Also, freshly shaved skin may become irritated if using the CHG soap. ? ?Contact lenses, hearing aids and dentures may not be worn into surgery. ? ?Do not bring valuables to the hospital. [facility] is not responsible for any missing/lost belongings or valuables.  ? ?Fleets enema or bowel prep as directed. ? ?Bring your C-PAP to the hospital with you in case you may have to spend the night.  ? ?Notify your  doctor if there is any change in your medical condition (cold, fever, infection). ? ?Wear comfortable clothing (specific to your surgery type) to the hospital. ? ?After surgery, you can help prevent lung complications by doing breathing exercises.  ?Take deep breaths and cough every 1-2 hours. Your doctor may order a device called an Incentive Spirometer to help you take deep breaths. ?When coughing or sneezing, hold a pillow firmly against your incision with  both hands. This is called ?splinting.? Doing this helps protect your incision. It also decreases belly discomfort. ? ?If you are being admitted to the hospital overnight, leave your suitcase in the car. ?After surgery it may be brought to your room. ? ?If you are being discharged the day of surgery, you will not be allowed to drive home. ?You will need a responsible adult (18 years or older) to drive you home and stay with you that night.  ? ?If you are taking public transportation, you will need to have a responsible adult (18 years or older) with you. ?Please confirm with your physician that it is acceptable to use public transportation.  ? ?Please call the Pre-admissions Testing Dept. at [phone] if you have any questions about these instructions. ? ?Surgery Visitation Policy: ? ?Patients undergoing a surgery or procedure may have two family members or support persons with them as long as the person is not COVID-19 positive or experiencing its symptoms.  ? ?Inpatient Visitation:   ? ?Visiting hours are 7 a.m. to 8 p.m. ?Up to four visitors are allowed at one time in a patient room, including children. The visitors may rotate out with other people during the day. One designated support person (adult) may remain overnight.  ?

## 2022-04-13 ENCOUNTER — Encounter: Payer: Self-pay | Admitting: Surgery

## 2022-04-13 NOTE — Progress Notes (Signed)
?Perioperative Services ? ?Pre-Admission/Anesthesia Testing Clinical Review ? ?Date: 04/13/22 ? ?Patient Demographics:  ?Name: Tom Underwood ?DOB:   March 29, 1951 ?MRN:   [id number] ? ?Planned Surgical Procedure(s):  ? ? Case: [id number] Date/Time: 04/15/22 0745  ? Procedure: EXAM UNDER ANESTHESIA WITH HEMORRHOIDECTOMY (Rectum)  ? Anesthesia type: General  ? Pre-op diagnosis: Perianal venous thrombosis K64.5  ? Location: [facility] OR ROOM 04 / [facility] ORS FOR ANESTHESIA GROUP  ? Surgeons: Sung Amabile, DO  ? ?NOTE: Available PAT nursing documentation and vital signs have been reviewed. Clinical nursing staff has updated patient's PMH/PSHx, current medication list, and drug allergies/intolerances to ensure comprehensive history available to assist in medical decision making as it pertains to the aforementioned surgical procedure and anticipated anesthetic course. Extensive review of available clinical information performed. [place] PMH and PSHx updated with any diagnoses/procedures that  may have been inadvertently omitted during his intake with the pre-admission testing department's nursing staff. ? ?Clinical Discussion:  ?Tom Underwood is a 71 y.o. male who is submitted for pre-surgical anesthesia review and clearance prior to him undergoing the above procedure. Patient is a Former Smoker (quit 02/1974). Pertinent PMH includes: atrial fibrillation, childhood cardiac murmur, OSAH (requires nocturnal PAP therapy), HTN, HLD, prediabetes, GERD (on daily PPI), BPH, OA, lumbar DDD, anxiety, depression. ? ?Patient is followed by cardiology Azucena Cecil, MD). He was last seen in the cardiology clinic on 03/17/2022; notes reviewed. At the time of his clinic visit, the patient denied any chest pain, shortness of breath, PND, orthopnea, palpitations, significant peripheral edema, vertiginous symptoms, or presyncope/syncope.  Patient with a PMH significant for cardiovascular diagnoses. ? ?Patient suffered a LEFT posterior cor  radiata CVA on 07/23/2019.  Patient with residual RIGHT-sided weakness following neurological event. ? ?Myocardial perfusion imaging study performed on 07/15/2020 revealing a normal left ventricular systolic function with an EF of 55-60%.There were no regional wall motion abnormalities. There is no evidence of significant valvular regurgitation or transvalvular gradient suggestive of stenosis. ? ?Patient underwent DCCV procedure on 12/08/2021.  He received a single 200 J synchronized cardioversion, which restored NSR.  To date, patient has had no arrhythmia recurrence. ? ?Patient has an atrial fibrillation diagnosis; CHA2DS2-VASc Score = 4 (age, HTN, CVA x 2).  Rate and rhythm maintained on oral metoprolol tartrate.  Patient chronically anticoagulated using standard dose rivaroxaban.  Patient compliant with anticoagulation therapy, however he was experiencing hematochezia felt to be associated with perianal venous thrombosis (hemorrhoids), thus anticoagulant was on hold.  Blood pressure well controlled at 130/72 on currently prescribed diuretic, beta-blocker, and ARB therapies.  Patient is on a statin + omega-3 fatty acid for his HLD diagnosis.  He is prediabetic; HgbA1c was 5.5 when checked on 05/28/2021.  Patient with an OSAH diagnosis and is reported to be compliant with prescribed nocturnal PAP therapy.  Following his CVA, patient continues to work with physical therapy to improve muscle strength.  With that being said, patient felt to be able to achieve at least 4 METS of activity without angina/anginal equivalent symptoms.  No changes were made to his medication regimen.  Patient to follow-up with outpatient cardiology in 6 months or sooner if needed. ? ?Tom Underwood is scheduled for an elective EXAM UNDER ANESTHESIA WITH HEMORRHOIDECTOMY on 04/15/2022 with Dr. Sung Amabile, DO. Given patient's past medical history significant for cardiovascular diagnoses, presurgical cardiac clearance was sought by the  performing surgeon's office and PAT team. Per cardiology, "based ACC/AHA guidelines, the patient's past medical history, and the amount of time  since his last clinic visit, this patient would be at an overall ACCEPTABLE risk for the planned procedure without further cardiovascular testing or intervention at this time".  Again, this patient is on daily anticoagulation therapy.  Cardiology has discussed with patient the risk versus benefits of stopping this medication, including him experiencing another stroke.  Patient fully understands risk and is willing to proceed with planned surgical intervention.  Cardiology has cleared patient to hold rivaroxaban for 2 days prior to his procedure with plans to restart as soon as postoperative bleeding risk felt to be minimized by primary attending surgeon.  The patient is aware that his last dose of rivaroxaban will be on 04/12/2021. ? ?Patient reports previous perioperative complications with anesthesia in the past. He has a (+) intolerance to propofol, citing that it causes him to experience "skin burning and involuntary muscle twitching". In review of the available records, it is noted that [facility] in 12/2021 without documented complications.  ? ? ?  03/17/2022  ? 10:52 AM 02/25/2022  ?  2:41 PM 02/07/2022  ? 11:02 AM  ?Vitals with BMI  ?Height 5\' 10"  5\' 10"  5\' 10"   ?Weight 288 lbs 13 oz 288 lbs 3 oz 291 lbs  ?BMI 41.44 41.35 41.75  ?Systolic 130 122 [id number]  ?Diastolic 72 60 82  ?Pulse 59 81 57  ? ? ?Providers/Specialists:  ? ?NOTE: Primary physician provider listed below. Patient may have been seen by APP or partner within same practice.  ? ?PROVIDER ROLE / SPECIALTY LAST OV  ?Sung AmabileSakai, Isami, DO General Surgery (Surgeon) 04/04/2022  ?Alba CorySowles, Krichna, MD Primary Care Provider 02/25/2022  ?Debbe OdeaAgbor-Etang, Brian, MD Cardiology 03/17/2022  ? ?Allergies:  ?Penicillins and  Propofol ? ?Current Home Medications:  ? ?No current facility-administered medications for this encounter.  ? ? acetaminophen (TYLENOL) 650 MG CR tablet  ? amitriptyline (ELAVIL) 25 MG tablet  ? buPROPion (WELLBUTRIN XL) 150 MG 24 hr tablet  ? busPIRone (BUSPAR) 10 MG tablet  ? cetirizine (ZYRTEC) 10 MG tablet  ? Cholecalciferol (VITAMIN D) 125 MCG (5000 UT) CAPS  ? fluticasone (FLONASE) 50 MCG/ACT nasal spray  ? furosemide (LASIX) 20 MG tablet  ? hydrocortisone (ANUSOL-HC) 25 MG suppository  ? meloxicam (MOBIC) 15 MG tablet  ? methocarbamol (ROBAXIN) 500 MG tablet  ? metoprolol tartrate (LOPRESSOR) 50 MG tablet  ? Multiple Vitamin (MULTIVITAMIN) tablet  ? omega-3 acid ethyl esters (LOVAZA) 1 g capsule  ? pantoprazole (PROTONIX) 20 MG tablet  ? rivaroxaban (XARELTO) 20 MG TABS tablet  ? rosuvastatin (CRESTOR) 20 MG tablet  ? Saccharomyces boulardii (PROBIOTIC) 250 MG CAPS  ? tamsulosin (FLOMAX) 0.4 MG CAPS capsule  ? valsartan (DIOVAN) 80 MG tablet  ? VISINE DRY EYE RELIEF 1 % SOLN  ? GLUCOSAMINE-CHONDROITIN PO  ? ?History:  ? ?Past Medical History:  ?Diagnosis Date  ? Anxiety   ? Arthritis   ? Atrial fibrillation (HCC)   ? a.) CHA2DS2-VASc = 4 (age, HTN, CVA x2). b.) s/p 200 J synchronized cardioversion (DCCV) on 12/08/2021. c.) rate/rythum maintained on oral metoprolol tartrate; chronically anticoagulated using rivaroxaban.  ? BPH (benign prostatic hyperplasia)   ? Complication of anesthesia   ? a.)  Intolerant of propofol; causes "skin burning and involuntary muscle twitching"  ? DDD (degenerative disc disease), lumbar   ? Decreased libido   ? Depression   ? Diverticulitis   ? Diverticulosis   ? GERD (gastroesophageal reflux disease)   ?  Hepatic steatosis   ? History of 2019 novel coronavirus disease (COVID-19) 12/28/2020  ? History of cardiac murmur in childhood   ? History of kidney stones   ? HLD (hyperlipidemia)   ? HLD (hyperlipidemia)   ? Hypertension   ? Hypogonadism in male   ? Long term current use of  anticoagulant   ? a.) rivaroxaban  ? OSA on CPAP   ? Pre-diabetes   ? Rhinitis, allergic   ? Stroke (HCC) 07/23/2019  ? a.) LEFT posterior cor radiata. b.) residual RIGHT sided weakness  ? ?Past Surgical History:  ?Procedure L

## 2022-04-14 ENCOUNTER — Ambulatory Visit: Payer: Medicare Other | Admitting: Physical Therapy

## 2022-04-15 ENCOUNTER — Encounter
Admission: RE | Disposition: A | Discharge status: Home / Self Care | Payer: Self-pay | Source: Home / Self Care | Attending: Surgery

## 2022-04-15 ENCOUNTER — Ambulatory Visit
Admission: RE | Admit: 2022-04-15 | Discharge: 2022-04-15 | Disposition: A | Discharge status: Home / Self Care | Payer: Medicare Other | Attending: Surgery | Admitting: Surgery

## 2022-04-15 ENCOUNTER — Ambulatory Visit: Payer: Medicare Other | Admitting: Urgent Care

## 2022-04-15 ENCOUNTER — Other Ambulatory Visit: Payer: Self-pay

## 2022-04-15 ENCOUNTER — Encounter: Payer: Self-pay | Admitting: Surgery

## 2022-04-15 DIAGNOSIS — E785 Hyperlipidemia, unspecified: Secondary | ICD-10-CM | POA: Diagnosis not present

## 2022-04-15 DIAGNOSIS — E669 Obesity, unspecified: Secondary | ICD-10-CM | POA: Diagnosis not present

## 2022-04-15 DIAGNOSIS — K645 Perianal venous thrombosis: Secondary | ICD-10-CM | POA: Diagnosis not present

## 2022-04-15 DIAGNOSIS — Z8673 Personal history of transient ischemic attack (TIA), and cerebral infarction without residual deficits: Secondary | ICD-10-CM | POA: Diagnosis not present

## 2022-04-15 DIAGNOSIS — K641 Second degree hemorrhoids: Secondary | ICD-10-CM | POA: Diagnosis not present

## 2022-04-15 DIAGNOSIS — Z87891 Personal history of nicotine dependence: Secondary | ICD-10-CM | POA: Diagnosis not present

## 2022-04-15 DIAGNOSIS — I10 Essential (primary) hypertension: Secondary | ICD-10-CM | POA: Diagnosis not present

## 2022-04-15 DIAGNOSIS — Z01812 Encounter for preprocedural laboratory examination: Secondary | ICD-10-CM

## 2022-04-15 DIAGNOSIS — G4730 Sleep apnea, unspecified: Secondary | ICD-10-CM | POA: Insufficient documentation

## 2022-04-15 DIAGNOSIS — K219 Gastro-esophageal reflux disease without esophagitis: Secondary | ICD-10-CM | POA: Insufficient documentation

## 2022-04-15 DIAGNOSIS — Z6841 Body Mass Index (BMI) 40.0 and over, adult: Secondary | ICD-10-CM | POA: Diagnosis not present

## 2022-04-15 DIAGNOSIS — I4891 Unspecified atrial fibrillation: Secondary | ICD-10-CM | POA: Insufficient documentation

## 2022-04-15 DIAGNOSIS — K644 Residual hemorrhoidal skin tags: Secondary | ICD-10-CM | POA: Insufficient documentation

## 2022-04-15 DIAGNOSIS — G4733 Obstructive sleep apnea (adult) (pediatric): Secondary | ICD-10-CM | POA: Diagnosis not present

## 2022-04-15 HISTORY — DX: Obstructive sleep apnea (adult) (pediatric): G47.33

## 2022-04-15 HISTORY — DX: Fatty (change of) liver, not elsewhere classified: K76.0

## 2022-04-15 HISTORY — DX: Other intervertebral disc degeneration, lumbar region: M51.36

## 2022-04-15 HISTORY — DX: Diverticulosis of intestine, part unspecified, without perforation or abscess without bleeding: K57.90

## 2022-04-15 HISTORY — DX: Obstructive sleep apnea (adult) (pediatric): Z99.89

## 2022-04-15 HISTORY — DX: Other intervertebral disc degeneration, lumbar region without mention of lumbar back pain or lower extremity pain: M51.369

## 2022-04-15 HISTORY — PX: EVALUATION UNDER ANESTHESIA WITH HEMORRHOIDECTOMY: SHX5624

## 2022-04-15 HISTORY — DX: Personal history of other diseases of the circulatory system: Z86.79

## 2022-04-15 HISTORY — DX: Long term (current) use of anticoagulants: Z79.01

## 2022-04-15 HISTORY — DX: Benign prostatic hyperplasia without lower urinary tract symptoms: N40.0

## 2022-04-15 HISTORY — DX: Unspecified atrial fibrillation: I48.91

## 2022-04-15 LAB — BASIC METABOLIC PANEL
Anion gap: 8 (ref 5–15)
BUN: 14 mg/dL (ref 8–23)
CO2: 25 mmol/L (ref 22–32)
Calcium: 9.6 mg/dL (ref 8.9–10.3)
Chloride: 107 mmol/L (ref 98–111)
Creatinine, Ser: 0.93 mg/dL (ref 0.61–1.24)
GFR, Estimated: >60 mL/min (ref >60)
Glucose, Bld: 123 mg/dL — ABNORMAL HIGH (ref 70–99)
Potassium: 3.6 mmol/L (ref 3.5–5.1)
Sodium: 140 mmol/L (ref 135–145)

## 2022-04-15 LAB — POCT I-STAT, CHEM 8
BUN: 14 mg/dL (ref 8–23)
Calcium, Ion: 1.15 mmol/L (ref 1.15–1.40)
Chloride: 102 mmol/L (ref 98–111)
Creatinine, Ser: 0.9 mg/dL (ref 0.61–1.24)
Glucose, Bld: 122 mg/dL — ABNORMAL HIGH (ref 70–99)
HCT: 49 % (ref 39.0–52.0)
Hemoglobin: 16.7 g/dL (ref 13.0–17.0)
Potassium: 3.6 mmol/L (ref 3.5–5.1)
Sodium: 140 mmol/L (ref 135–145)
TCO2: 25 mmol/L (ref 22–32)

## 2022-04-15 SURGERY — EXAM UNDER ANESTHESIA WITH HEMORRHOIDECTOMY
Anesthesia: General | Site: Rectum

## 2022-04-15 MED ORDER — LACTATED RINGERS IV SOLN
INTRAVENOUS | Status: DC
Start: 2022-04-15 — End: 2022-04-15

## 2022-04-15 MED ORDER — ROCURONIUM BROMIDE 100 MG/10ML IV SOLN
INTRAVENOUS | Status: DC | PRN
  Administered 2022-04-15: 40 mg via INTRAVENOUS

## 2022-04-15 MED ORDER — ACETAMINOPHEN 500 MG PO TABS
ORAL_TABLET | ORAL | Status: AC
  Administered 2022-04-15: 1000 mg via ORAL
  Filled 2022-04-15: qty 2

## 2022-04-15 MED ORDER — ONDANSETRON HCL 4 MG/2ML IJ SOLN: 4.0000 mg | Freq: Once | INTRAMUSCULAR | Status: DC | PRN

## 2022-04-15 MED ORDER — DOCUSATE SODIUM 100 MG PO CAPS: 100.0000 mg | ORAL_CAPSULE | Freq: Two times a day (BID) | ORAL | 0 refills | Status: DC | PRN

## 2022-04-15 MED ORDER — MIDAZOLAM HCL 2 MG/2ML IJ SOLN
INTRAMUSCULAR | Status: DC | PRN
  Administered 2022-04-15: 2 mg via INTRAVENOUS

## 2022-04-15 MED ORDER — PROPOFOL 500 MG/50ML IV EMUL
INTRAVENOUS | Status: AC
  Filled 2022-04-15: qty 50

## 2022-04-15 MED ORDER — PHENYLEPHRINE HCL-NACL 20-0.9 MG/250ML-% IV SOLN
INTRAVENOUS | Status: DC | PRN
  Administered 2022-04-15: 100 ug/min via INTRAVENOUS

## 2022-04-15 MED ORDER — CHLORHEXIDINE GLUCONATE 0.12 % MT SOLN: 15.0000 mL | Freq: Once | OROMUCOSAL | Status: AC

## 2022-04-15 MED ORDER — ACETAMINOPHEN 325 MG PO TABS: 650.0000 mg | ORAL_TABLET | Freq: Three times a day (TID) | ORAL | 0 refills | Status: AC | PRN

## 2022-04-15 MED ORDER — FENTANYL CITRATE (PF) 100 MCG/2ML IJ SOLN: 25.0000 ug | INTRAMUSCULAR | Status: DC | PRN

## 2022-04-15 MED ORDER — LIDOCAINE HCL (CARDIAC) PF 100 MG/5ML IV SOSY
PREFILLED_SYRINGE | INTRAVENOUS | Status: DC | PRN
  Administered 2022-04-15: 100 mg via INTRAVENOUS

## 2022-04-15 MED ORDER — GELATIN ABSORBABLE 100 CM EX MISC
CUTANEOUS | Status: AC
  Filled 2022-04-15: qty 1

## 2022-04-15 MED ORDER — CHLORHEXIDINE GLUCONATE 0.12 % MT SOLN
OROMUCOSAL | Status: AC
  Administered 2022-04-15: 15 mL via OROMUCOSAL
  Filled 2022-04-15: qty 15

## 2022-04-15 MED ORDER — FENTANYL CITRATE (PF) 100 MCG/2ML IJ SOLN
INTRAMUSCULAR | Status: DC | PRN
  Administered 2022-04-15: 100 ug via INTRAVENOUS

## 2022-04-15 MED ORDER — MIDAZOLAM HCL 2 MG/2ML IJ SOLN
INTRAMUSCULAR | Status: AC
  Filled 2022-04-15: qty 2

## 2022-04-15 MED ORDER — FENTANYL CITRATE (PF) 100 MCG/2ML IJ SOLN
INTRAMUSCULAR | Status: AC
  Filled 2022-04-15: qty 2

## 2022-04-15 MED ORDER — GABAPENTIN 300 MG PO CAPS
ORAL_CAPSULE | ORAL | Status: AC
  Administered 2022-04-15: 300 mg via ORAL
  Filled 2022-04-15: qty 1

## 2022-04-15 MED ORDER — GLYCOPYRROLATE 0.2 MG/ML IJ SOLN
INTRAMUSCULAR | Status: DC | PRN
Start: 2022-04-15 — End: 2022-04-15
  Administered 2022-04-15: .2 mg via INTRAVENOUS

## 2022-04-15 MED ORDER — CELECOXIB 200 MG PO CAPS: 200.0000 mg | ORAL_CAPSULE | ORAL | Status: AC

## 2022-04-15 MED ORDER — CHLORHEXIDINE GLUCONATE CLOTH 2 % EX PADS: 6.0000 | MEDICATED_PAD | Freq: Once | CUTANEOUS | Status: DC

## 2022-04-15 MED ORDER — BUPIVACAINE LIPOSOME 1.3 % IJ SUSP
INTRAMUSCULAR | Status: AC
  Filled 2022-04-15: qty 20

## 2022-04-15 MED ORDER — DEXAMETHASONE SODIUM PHOSPHATE 10 MG/ML IJ SOLN
INTRAMUSCULAR | Status: DC | PRN
  Administered 2022-04-15: 10 mg via INTRAVENOUS

## 2022-04-15 MED ORDER — GABAPENTIN 300 MG PO CAPS: 300.0000 mg | ORAL_CAPSULE | ORAL | Status: AC

## 2022-04-15 MED ORDER — PROPOFOL 10 MG/ML IV BOLUS
INTRAVENOUS | Status: DC | PRN
  Administered 2022-04-15: 200 mg via INTRAVENOUS

## 2022-04-15 MED ORDER — CELECOXIB 200 MG PO CAPS
ORAL_CAPSULE | ORAL | Status: AC
  Administered 2022-04-15: 200 mg via ORAL
  Filled 2022-04-15: qty 1

## 2022-04-15 MED ORDER — ORAL CARE MOUTH RINSE: 15.0000 mL | Freq: Once | OROMUCOSAL | Status: AC

## 2022-04-15 MED ORDER — BUPIVACAINE-EPINEPHRINE (PF) 0.5% -1:200000 IJ SOLN
INTRAMUSCULAR | Status: AC
  Filled 2022-04-15: qty 30

## 2022-04-15 MED ORDER — EPHEDRINE SULFATE (PRESSORS) 50 MG/ML IJ SOLN
INTRAMUSCULAR | Status: DC | PRN
  Administered 2022-04-15 (×2): 10 mg via INTRAVENOUS
  Administered 2022-04-15: 5 mg via INTRAVENOUS

## 2022-04-15 MED ORDER — ONDANSETRON HCL 4 MG/2ML IJ SOLN
INTRAMUSCULAR | Status: DC | PRN
Start: 2022-04-15 — End: 2022-04-15
  Administered 2022-04-15: 4 mg via INTRAVENOUS

## 2022-04-15 MED ORDER — OXYCODONE-ACETAMINOPHEN 5-325 MG PO TABS: 1.0000 | ORAL_TABLET | Freq: Three times a day (TID) | ORAL | 0 refills | Status: DC | PRN

## 2022-04-15 MED ORDER — BUPIVACAINE-EPINEPHRINE (PF) 0.5% -1:200000 IJ SOLN
INTRAMUSCULAR | Status: DC | PRN
  Administered 2022-04-15: 10 mL

## 2022-04-15 MED ORDER — BUPIVACAINE LIPOSOME 1.3 % IJ SUSP
INTRAMUSCULAR | Status: DC | PRN
  Administered 2022-04-15: 20 mL

## 2022-04-15 MED ORDER — SUGAMMADEX SODIUM 500 MG/5ML IV SOLN
INTRAVENOUS | Status: DC | PRN
  Administered 2022-04-15: 300 mg via INTRAVENOUS

## 2022-04-15 MED ORDER — SUCCINYLCHOLINE CHLORIDE 200 MG/10ML IV SOSY
PREFILLED_SYRINGE | INTRAVENOUS | Status: DC | PRN
  Administered 2022-04-15: 170 mg via INTRAVENOUS

## 2022-04-15 MED ORDER — LACTATED RINGERS IV SOLN: INTRAVENOUS | Status: DC | PRN

## 2022-04-15 MED ORDER — LIDOCAINE 5 % EX OINT: 1.0000 "application " | TOPICAL_OINTMENT | Freq: Three times a day (TID) | CUTANEOUS | 0 refills | Status: DC | PRN

## 2022-04-15 MED ORDER — ACETAMINOPHEN 500 MG PO TABS: 1000.0000 mg | ORAL_TABLET | ORAL | Status: AC

## 2022-04-15 SURGICAL SUPPLY — 34 items
BLADE SURG 15 STRL LF DISP TIS (BLADE) ×1 IMPLANT
BLADE SURG 15 STRL SS (BLADE) ×2
DRAPE PERI LITHO V/GYN (MISCELLANEOUS) ×2 IMPLANT
DRAPE UNDER BUTTOCK W/FLU (DRAPES) ×2 IMPLANT
DRSG GAUZE FLUFF 36X18 (GAUZE/BANDAGES/DRESSINGS) ×2 IMPLANT
ELECT REM PT RETURN 9FT ADLT (ELECTROSURGICAL) ×2
ELECTRODE REM PT RTRN 9FT ADLT (ELECTROSURGICAL) ×1 IMPLANT
GAUZE 4X4 16PLY ~~LOC~~+RFID DBL (SPONGE) ×1 IMPLANT
GLOVE BIOGEL PI IND STRL 7.0 (GLOVE) ×1 IMPLANT
GLOVE BIOGEL PI INDICATOR 7.0 (GLOVE) ×1
GLOVE SURG SYN 6.5 ES PF (GLOVE) ×6 IMPLANT
GLOVE SURG SYN 6.5 PF PI (GLOVE) ×1 IMPLANT
GOWN STRL REUS W/ TWL LRG LVL3 (GOWN DISPOSABLE) ×2 IMPLANT
GOWN STRL REUS W/TWL LRG LVL3 (GOWN DISPOSABLE) ×4
KIT TURNOVER CYSTO (KITS) ×2 IMPLANT
LABEL OR SOLS (LABEL) ×2 IMPLANT
MANIFOLD NEPTUNE II (INSTRUMENTS) ×2 IMPLANT
NDL HYPO 25X1 1.5 SAFETY (NEEDLE) ×1 IMPLANT
NEEDLE HYPO 22GX1.5 SAFETY (NEEDLE) ×2 IMPLANT
NEEDLE HYPO 25X1 1.5 SAFETY (NEEDLE) ×2 IMPLANT
NS IRRIG 500ML POUR BTL (IV SOLUTION) ×2 IMPLANT
PACK BASIN MINOR ARMC (MISCELLANEOUS) ×2 IMPLANT
PAD PREP 24X41 OB/GYN DISP (PERSONAL CARE ITEMS) ×1 IMPLANT
PANTS MESH DISP 2XL (UNDERPADS AND DIAPERS) ×1 IMPLANT
PANTS MESH DISPOSABLE 2XL (UNDERPADS AND DIAPERS) ×1
SHEARS HARMONIC 9CM CVD (BLADE) IMPLANT
SOL PREP PVP 2OZ (MISCELLANEOUS) ×2
SOLUTION PREP PVP 2OZ (MISCELLANEOUS) ×1 IMPLANT
SURGILUBE 2OZ TUBE FLIPTOP (MISCELLANEOUS) ×2 IMPLANT
SUT VIC AB 3-0 SH 27 (SUTURE)
SUT VIC AB 3-0 SH 27X BRD (SUTURE) ×1 IMPLANT
SWAB CULTURE AMIES ANAERIB BLU (MISCELLANEOUS) IMPLANT
SYR 10ML LL (SYRINGE) ×2 IMPLANT
SYR 20ML LL LF (SYRINGE) ×2 IMPLANT

## 2022-04-15 NOTE — Op Note (Signed)
Preoperative diagnosis: second degree hemorrhoids.  ? ?Postoperative diagnosis: same ? ?Procedure: exam under anesthesia, two column hemorrhoidectomy. ? ?Surgeon: Tonna Boehringer ? ?Anesthesia: general ? ?Specimen: hemorrhoids x2 ? ?Complications: none ? ?EBL: 24mL ? ?Wound classification: Clean Contaminated ? ?Indications: Patient is a 71 y.o. male was found to have symptomatic hemorrhoids refractory to medical management.  ? ?Findings: ?1. second  degree hemorrhoids ?2. Internal and external anal sphincter palpated and preserved ?3. Adequate hemostasis ? ?Description of procedure: The patient was brought to the operating room and general anesthesia was induced. Patient was placed high lithotomy position. A time-out was completed verifying correct patient, procedure, site, positioning, and implant(s) and/or special equipment prior to beginning this procedure. The perineum was prepped and draped in standard sterile fashion. Local anesthetic was injected as a perianal block. An anoscope was introduced and hemorrhoidal pedicles identified. ? ?A harmonic was placed across the base of the left pedicle and excess hemorrhoidal tissue removed.  Specimen was passed off operative field pending pathology.  3-0 Vicryl then used to close the open wound. ? ?A harmonic was placed across the base of the right anteriorpedicle and excess hemorrhoidal tissue removed.  Specimen was passed off operative field pending pathology.  3-0 Vicryl then used to close the open wound. ? ?Hemostasis achieved with additional 3-0 vicryl suture in areas of bleeding until all active bleeding controlled.  Last inspection of the anal canal did not note any additional hemorrhoidal tissue and no other pathology. Exparel injected as a perianal block. Surgifoam placed within rectum. A gauze pad was tucked between the gluteal folds, and secured in place with mesh underwear. ? ?The patient tolerated the procedure well and was taken to the postanesthesia care unit in  stable condition.  Sponge and instrument count correct at end of procedure. ? ?

## 2022-04-15 NOTE — Anesthesia Procedure Notes (Signed)
Procedure Name: Intubation ?Date/Time: 04/15/2022 8:11 AM ?Performed by: Beverely Low, CRNA ?Pre-anesthesia Checklist: Patient identified, Patient being monitored, Timeout performed, Emergency Drugs available and Suction available ?Patient Re-evaluated:Patient Re-evaluated prior to induction ?Oxygen Delivery Method: Circle system utilized ?Preoxygenation: Pre-oxygenation with 100% oxygen ?Induction Type: IV induction ?Ventilation: Mask ventilation without difficulty ?Laryngoscope Size: McGraph and 4 ?Grade View: Grade I ?Tube type: Oral ?Tube size: 7.5 mm ?Number of attempts: 1 ?Airway Equipment and Method: Stylet ?Placement Confirmation: ETT inserted through vocal cords under direct vision, positive ETCO2 and breath sounds checked- equal and bilateral ?Secured at: 21 cm ?Tube secured with: Tape ?Dental Injury: Teeth and Oropharynx as per pre-operative assessment  ? ? ? ? ?

## 2022-04-15 NOTE — Transfer of Care (Signed)
Immediate Anesthesia Transfer of Care Note ? ?Patient: Tom Underwood ? ?Procedure(s) Performed: EXAM UNDER ANESTHESIA WITH HEMORRHOIDECTOMY (Rectum) ? ?Patient Location: PACU ? ?Anesthesia Type:General ? ?Level of Consciousness: drowsy ? ?Airway & Oxygen Therapy: Patient Spontanous Breathing and Patient connected to face mask oxygen ? ?Post-op Assessment: Report given to RN and Post -op Vital signs reviewed and stable ? ?Post vital signs: Reviewed and stable ? ?Last Vitals:  ?Vitals Value Taken Time  ?BP 116/59 04/15/22 0854  ?Temp 36.5 ?C 04/15/22 0854  ?Pulse 63 04/15/22 0859  ?Resp 13 04/15/22 0859  ?SpO2 98 % 04/15/22 0859  ?Vitals shown include unvalidated device data. ? ?Last Pain:  ?Vitals:  ? 04/15/22 0854  ?TempSrc:   ?PainSc: Asleep  ?   ? ?  ? ?Complications: No notable events documented. ?

## 2022-04-15 NOTE — Anesthesia Preprocedure Evaluation (Addendum)
Anesthesia Evaluation  ?Patient identified by MRN, date of birth, ID band ?Patient awake ? ? ? ?Airway ?Mallampati: II ? ?TM Distance: >3 FB ? ? ? ? Dental ? ?(+) Teeth Intact, Caps ?  ?Pulmonary ?shortness of breath and with exertion, sleep apnea and Continuous Positive Airway Pressure Ventilation , former smoker,  ?  ? ?+ decreased breath sounds ? ? ? ? ? Cardiovascular ?Exercise Tolerance: Good ?hypertension, Pt. on medications ?+ dysrhythmias Atrial Fibrillation  ?Rhythm:Regular Rate:Normal ? ? ?  ?Neuro/Psych ?Anxiety Depression CVA, Residual Symptoms   ? GI/Hepatic ?Neg liver ROS, GERD  Medicated,  ?Endo/Other  ? ? Renal/GU ?negative Renal ROS  ? ?  ?Musculoskeletal ? ?(+) Arthritis ,  ? Abdominal ?(+) + obese,   ?Peds ?negative pediatric ROS ?(+)  Hematology ?  ?Anesthesia Other Findings ? ? Reproductive/Obstetrics ? ?  ? ? ? ? ? ? ? ? ? ? ? ? ? ?  ?  ? ? ? ? ? ? ? ?Anesthesia Physical ?Anesthesia Plan ? ?ASA: 3 ? ?Anesthesia Plan: General  ? ?Post-op Pain Management:   ? ?Induction: Intravenous ? ?PONV Risk Score and Plan:  ? ?Airway Management Planned: Oral ETT ? ?Additional Equipment:  ? ?Intra-op Plan:  ? ?Post-operative Plan: Extubation in OR ? ?Informed Consent: I have reviewed the patients History and Physical, chart, labs and discussed the procedure including the risks, benefits and alternatives for the proposed anesthesia with the patient or authorized representative who has indicated his/her understanding and acceptance.  ? ? ? ? ? ?Plan Discussed with: CRNA and Surgeon ? ?Anesthesia Plan Comments:   ? ? ? ? ? ? ?Anesthesia Quick Evaluation ? ?

## 2022-04-15 NOTE — Discharge Instructions (Addendum)
Hemorrhoids, Care After ?This sheet gives you information about how to care for yourself after your procedure. Your health care provider may also give you more specific instructions. If you have problems or questions, contact your health care provider. ?What can I expect after the procedure? ?After the procedure, it is common to have: ?Soreness. ?Bruising. ?Itching. ? ?Follow these instructions at home: ?site care ?Follow instructions from your health care provider about how to take care of your site. Make sure you: ?LEAVE packing in place until it falls out on its own.  No need to replace afterwards ?Leave stitches (sutures), skin glue, or adhesive strips in place.  ?If the area bleeds or bruises, apply gentle pressure for 10 minutes. ?OK TO SHOWER IN 24HRS ? ?General instructions ?Rest and then return to your normal activities as told by your health care provider. ?tylenol as needed for discomfort.   Use narcotics, if prescribed, only when tylenol and motrin is not enough to control pain. ? 325-650mg  every 8hrs to max of 3000mg /24hrs (including the 325mg  in every norco dose) for the tylenol.   ? Keep all follow-up visits as told by your health care provider. This is important. ?Contact a health care provider if you have excessive: ?redness, swelling, or pain around your site. ?blood coming from your site. ?pus or a bad smell coming from your site. ?You have a fever. ? ?Get help right away if: ?You have bleeding that does not stop with pressure or a dressing. ?Summary ?After the procedure, it is common to have some soreness, bruising, and itching at the site. ?Follow instructions from your health care provider about how to take care of your site. ?Keep all follow-up visits as told by your health care provider. This is important. ?This information is not intended to replace advice given to you by your health care provider. Make sure you discuss any questions you have with your health care provider. ?Document  Released: 12/18/2015 Document Revised: 05/21/2018 Document Reviewed: 05/21/2018 ?Elsevier Interactive Patient Education ? 2019 [employer] ? ? ?AMBULATORY SURGERY  ?DISCHARGE INSTRUCTIONS ? ? ?The drugs that you were given will stay in your system until tomorrow so for the next 24 hours you should not: ? ?Drive an automobile ?Make any legal decisions ?Drink any alcoholic beverage ? ? ?You may resume regular meals tomorrow.  Today it is better to start with liquids and gradually work up to solid foods. ? ?You may eat anything you prefer, but it is better to start with liquids, then soup and crackers, and gradually work up to solid foods. ? ? ?Please notify your doctor immediately if you have any unusual bleeding, trouble breathing, redness and pain at the surgery site, drainage, fever, or pain not relieved by medication. ? ? ? ?Additional Instructions: ? ? ? ? ? ? ? ?Please contact your physician with any problems or Same Day Surgery at [phone], Monday through Friday 6 am to 4 pm, or [place] at [facility] number at [phone].  ?

## 2022-04-15 NOTE — Interval H&P Note (Signed)
History and Physical Interval Note: ? ?04/15/2022 ?7:48 AM ? ?Tom Underwood  has presented today for surgery, with the diagnosis of Perianal venous thrombosis K64.5.  The various methods of treatment have been discussed with the patient and family. After consideration of risks, benefits and other options for treatment, the patient has consented to  Procedure(s): ?EXAM UNDER ANESTHESIA WITH HEMORRHOIDECTOMY (N/A) as a surgical intervention.  The patient's history has been reviewed, patient examined, no change in status, stable for surgery.  I have reviewed the patient's chart and labs.  Questions were answered to the patient's satisfaction.   ? ? ?[name] ? ? ?

## 2022-04-15 NOTE — Anesthesia Procedure Notes (Signed)
Procedure Name: Intubation ?Date/Time: 04/15/2022 8:11 AM ?Performed by: Beverely Low, CRNA ?Pre-anesthesia Checklist: Patient identified, Patient being monitored, Timeout performed, Emergency Drugs available and Suction available ?Patient Re-evaluated:Patient Re-evaluated prior to induction ?Oxygen Delivery Method: Circle system utilized ?Preoxygenation: Pre-oxygenation with 100% oxygen ?Induction Type: IV induction ?Ventilation: Mask ventilation without difficulty ?Laryngoscope Size: Mac and 3 ?Grade View: Grade I ?Tube type: Oral ?Tube size: 7.0 mm ?Number of attempts: 1 ?Airway Equipment and Method: Stylet ?Placement Confirmation: ETT inserted through vocal cords under direct vision, positive ETCO2 and breath sounds checked- equal and bilateral ?Secured at: 21 cm ?Tube secured with: Tape ?Dental Injury: Teeth and Oropharynx as per pre-operative assessment  ? ? ? ? ?

## 2022-04-15 NOTE — Anesthesia Postprocedure Evaluation (Signed)
Anesthesia Post Note ? ?Patient: Tom Underwood ? ?Procedure(s) Performed: EXAM UNDER ANESTHESIA WITH HEMORRHOIDECTOMY (Rectum) ? ?Patient location during evaluation: PACU ?Anesthesia Type: General ?Level of consciousness: awake and oriented ?Pain management: pain level controlled ?Vital Signs Assessment: post-procedure vital signs reviewed and stable ?Respiratory status: spontaneous breathing and respiratory function stable ?Cardiovascular status: stable ?Anesthetic complications: no ? ? ?No notable events documented. ? ? ?Last Vitals:  ?Vitals:  ? 04/15/22 0930 04/15/22 0943  ?BP: 121/76 126/70  ?Pulse: 72 70  ?Resp: 15 16  ?Temp: 36.5 ?C 36.4 ?C  ?SpO2: 93% 97%  ?  ?Last Pain:  ?Vitals:  ? 04/15/22 0943  ?TempSrc: Temporal  ?PainSc: 0-No pain  ? ? ?  ?  ?  ?  ?  ?  ? ?VAN STAVEREN,Mannie Ohlin ? ? ? ? ?

## 2022-04-17 ENCOUNTER — Other Ambulatory Visit: Payer: Self-pay | Admitting: Family Medicine

## 2022-04-17 DIAGNOSIS — K219 Gastro-esophageal reflux disease without esophagitis: Secondary | ICD-10-CM

## 2022-04-18 LAB — SURGICAL PATHOLOGY

## 2022-04-23 ENCOUNTER — Observation Stay: Payer: Medicare Other | Admitting: Certified Registered"

## 2022-04-23 ENCOUNTER — Other Ambulatory Visit: Payer: Self-pay

## 2022-04-23 ENCOUNTER — Encounter
Admission: EM | Disposition: A | Discharge status: Home / Self Care | Payer: Self-pay | Source: Home / Self Care | Attending: Surgery

## 2022-04-23 ENCOUNTER — Emergency Department: Payer: Medicare Other

## 2022-04-23 ENCOUNTER — Inpatient Hospital Stay
Admission: EM | Admit: 2022-04-23 | Discharge: 2022-04-27 | DRG: 988 | Disposition: A | Discharge status: Home / Self Care | Payer: Medicare Other | Attending: Surgery | Admitting: Surgery

## 2022-04-23 DIAGNOSIS — F419 Anxiety disorder, unspecified: Secondary | ICD-10-CM | POA: Diagnosis present

## 2022-04-23 DIAGNOSIS — E785 Hyperlipidemia, unspecified: Secondary | ICD-10-CM | POA: Diagnosis present

## 2022-04-23 DIAGNOSIS — E872 Acidosis, unspecified: Secondary | ICD-10-CM | POA: Diagnosis present

## 2022-04-23 DIAGNOSIS — Z818 Family history of other mental and behavioral disorders: Secondary | ICD-10-CM | POA: Diagnosis not present

## 2022-04-23 DIAGNOSIS — I97121 Postprocedural cardiac arrest following other surgery: Secondary | ICD-10-CM | POA: Diagnosis not present

## 2022-04-23 DIAGNOSIS — I4819 Other persistent atrial fibrillation: Secondary | ICD-10-CM | POA: Diagnosis present

## 2022-04-23 DIAGNOSIS — R58 Hemorrhage, not elsewhere classified: Secondary | ICD-10-CM | POA: Diagnosis not present

## 2022-04-23 DIAGNOSIS — Z7901 Long term (current) use of anticoagulants: Secondary | ICD-10-CM

## 2022-04-23 DIAGNOSIS — Z825 Family history of asthma and other chronic lower respiratory diseases: Secondary | ICD-10-CM

## 2022-04-23 DIAGNOSIS — Z87891 Personal history of nicotine dependence: Secondary | ICD-10-CM

## 2022-04-23 DIAGNOSIS — R001 Bradycardia, unspecified: Secondary | ICD-10-CM | POA: Diagnosis not present

## 2022-04-23 DIAGNOSIS — Y838 Other surgical procedures as the cause of abnormal reaction of the patient, or of later complication, without mention of misadventure at the time of the procedure: Secondary | ICD-10-CM | POA: Diagnosis present

## 2022-04-23 DIAGNOSIS — K922 Gastrointestinal hemorrhage, unspecified: Secondary | ICD-10-CM | POA: Diagnosis not present

## 2022-04-23 DIAGNOSIS — D6832 Hemorrhagic disorder due to extrinsic circulating anticoagulants: Secondary | ICD-10-CM | POA: Diagnosis present

## 2022-04-23 DIAGNOSIS — T45515A Adverse effect of anticoagulants, initial encounter: Secondary | ICD-10-CM | POA: Diagnosis present

## 2022-04-23 DIAGNOSIS — G4733 Obstructive sleep apnea (adult) (pediatric): Secondary | ICD-10-CM | POA: Diagnosis present

## 2022-04-23 DIAGNOSIS — Z823 Family history of stroke: Secondary | ICD-10-CM

## 2022-04-23 DIAGNOSIS — Z79899 Other long term (current) drug therapy: Secondary | ICD-10-CM | POA: Diagnosis not present

## 2022-04-23 DIAGNOSIS — R55 Syncope and collapse: Secondary | ICD-10-CM

## 2022-04-23 DIAGNOSIS — I10 Essential (primary) hypertension: Secondary | ICD-10-CM | POA: Diagnosis present

## 2022-04-23 DIAGNOSIS — N99820 Postprocedural hemorrhage and hematoma of a genitourinary system organ or structure following a genitourinary system procedure: Secondary | ICD-10-CM | POA: Diagnosis present

## 2022-04-23 DIAGNOSIS — G4700 Insomnia, unspecified: Secondary | ICD-10-CM | POA: Diagnosis present

## 2022-04-23 DIAGNOSIS — R233 Spontaneous ecchymoses: Secondary | ICD-10-CM | POA: Diagnosis present

## 2022-04-23 DIAGNOSIS — D72829 Elevated white blood cell count, unspecified: Secondary | ICD-10-CM | POA: Diagnosis present

## 2022-04-23 DIAGNOSIS — Z791 Long term (current) use of non-steroidal anti-inflammatories (NSAID): Secondary | ICD-10-CM

## 2022-04-23 DIAGNOSIS — I480 Paroxysmal atrial fibrillation: Secondary | ICD-10-CM | POA: Diagnosis not present

## 2022-04-23 DIAGNOSIS — Z8616 Personal history of COVID-19: Secondary | ICD-10-CM

## 2022-04-23 DIAGNOSIS — D62 Acute posthemorrhagic anemia: Secondary | ICD-10-CM | POA: Diagnosis present

## 2022-04-23 DIAGNOSIS — Z8249 Family history of ischemic heart disease and other diseases of the circulatory system: Secondary | ICD-10-CM | POA: Diagnosis not present

## 2022-04-23 DIAGNOSIS — Z880 Allergy status to penicillin: Secondary | ICD-10-CM

## 2022-04-23 DIAGNOSIS — K625 Hemorrhage of anus and rectum: Secondary | ICD-10-CM | POA: Diagnosis not present

## 2022-04-23 DIAGNOSIS — R7303 Prediabetes: Secondary | ICD-10-CM | POA: Diagnosis present

## 2022-04-23 DIAGNOSIS — R39198 Other difficulties with micturition: Secondary | ICD-10-CM | POA: Diagnosis present

## 2022-04-23 DIAGNOSIS — Z8673 Personal history of transient ischemic attack (TIA), and cerebral infarction without residual deficits: Secondary | ICD-10-CM | POA: Diagnosis not present

## 2022-04-23 DIAGNOSIS — I469 Cardiac arrest, cause unspecified: Secondary | ICD-10-CM | POA: Diagnosis not present

## 2022-04-23 DIAGNOSIS — K648 Other hemorrhoids: Secondary | ICD-10-CM | POA: Diagnosis not present

## 2022-04-23 DIAGNOSIS — Z6841 Body Mass Index (BMI) 40.0 and over, adult: Secondary | ICD-10-CM | POA: Diagnosis not present

## 2022-04-23 DIAGNOSIS — R0602 Shortness of breath: Secondary | ICD-10-CM | POA: Diagnosis not present

## 2022-04-23 DIAGNOSIS — Z01810 Encounter for preprocedural cardiovascular examination: Secondary | ICD-10-CM | POA: Diagnosis not present

## 2022-04-23 DIAGNOSIS — K219 Gastro-esophageal reflux disease without esophagitis: Secondary | ICD-10-CM | POA: Diagnosis present

## 2022-04-23 DIAGNOSIS — R262 Difficulty in walking, not elsewhere classified: Secondary | ICD-10-CM | POA: Diagnosis not present

## 2022-04-23 HISTORY — PX: RECTAL EXAM UNDER ANESTHESIA: SHX6399

## 2022-04-23 LAB — COMPREHENSIVE METABOLIC PANEL
ALT: 20 U/L (ref 0–44)
ALT: 20 U/L (ref 0–44)
AST: 21 U/L (ref 15–41)
AST: 25 U/L (ref 15–41)
Albumin: 3.5 g/dL (ref 3.5–5.0)
Albumin: 3.5 g/dL (ref 3.5–5.0)
Alkaline Phosphatase: 84 U/L (ref 38–126)
Alkaline Phosphatase: 84 U/L (ref 38–126)
Anion gap: 6 (ref 5–15)
Anion gap: 7 (ref 5–15)
BUN: 18 mg/dL (ref 8–23)
BUN: 19 mg/dL (ref 8–23)
CO2: 25 mmol/L (ref 22–32)
CO2: 23 mmol/L (ref 22–32)
Calcium: 8.6 mg/dL — ABNORMAL LOW (ref 8.9–10.3)
Calcium: 8.6 mg/dL — ABNORMAL LOW (ref 8.9–10.3)
Chloride: 107 mmol/L (ref 98–111)
Chloride: 107 mmol/L (ref 98–111)
Creatinine, Ser: 0.94 mg/dL (ref 0.61–1.24)
Creatinine, Ser: 0.94 mg/dL (ref 0.61–1.24)
GFR, Estimated: >60 mL/min (ref >60)
GFR, Estimated: >60 mL/min (ref >60)
Glucose, Bld: 130 mg/dL — ABNORMAL HIGH (ref 70–99)
Glucose, Bld: 168 mg/dL — ABNORMAL HIGH (ref 70–99)
Potassium: 3.9 mmol/L (ref 3.5–5.1)
Potassium: 4.1 mmol/L (ref 3.5–5.1)
Sodium: 138 mmol/L (ref 135–145)
Sodium: 137 mmol/L (ref 135–145)
Total Bilirubin: 1.4 mg/dL — ABNORMAL HIGH (ref 0.3–1.2)
Total Bilirubin: 1.2 mg/dL (ref 0.3–1.2)
Total Protein: 6.4 g/dL — ABNORMAL LOW (ref 6.5–8.1)
Total Protein: 6.6 g/dL (ref 6.5–8.1)

## 2022-04-23 LAB — CBC WITH DIFFERENTIAL/PLATELET
Abs Immature Granulocytes: 0.07 10*3/uL (ref 0.00–0.07)
Abs Immature Granulocytes: 0.12 10*3/uL — ABNORMAL HIGH (ref 0.00–0.07)
Basophils Absolute: 0 10*3/uL (ref 0.0–0.1)
Basophils Absolute: 0 10*3/uL (ref 0.0–0.1)
Basophils Relative: 0 %
Basophils Relative: 0 %
Eosinophils Absolute: 0.1 10*3/uL (ref 0.0–0.5)
Eosinophils Absolute: 0 10*3/uL (ref 0.0–0.5)
Eosinophils Relative: 1 %
Eosinophils Relative: 0 %
HCT: 44.1 % (ref 39.0–52.0)
HCT: 35.7 % — ABNORMAL LOW (ref 39.0–52.0)
Hemoglobin: 14.7 g/dL (ref 13.0–17.0)
Hemoglobin: 12.4 g/dL — ABNORMAL LOW (ref 13.0–17.0)
Immature Granulocytes: 1 %
Immature Granulocytes: 1 %
Lymphocytes Relative: 19 %
Lymphocytes Relative: 14 %
Lymphs Abs: 2.1 10*3/uL (ref 0.7–4.0)
Lymphs Abs: 1.7 10*3/uL (ref 0.7–4.0)
MCH: 30.4 pg (ref 26.0–34.0)
MCH: 31.2 pg (ref 26.0–34.0)
MCHC: 33.3 g/dL (ref 30.0–36.0)
MCHC: 34.7 g/dL (ref 30.0–36.0)
MCV: 91.1 fL (ref 80.0–100.0)
MCV: 89.7 fL (ref 80.0–100.0)
Monocytes Absolute: 1 10*3/uL (ref 0.1–1.0)
Monocytes Absolute: 0.3 10*3/uL (ref 0.1–1.0)
Monocytes Relative: 9 %
Monocytes Relative: 3 %
Neutro Abs: 7.7 10*3/uL (ref 1.7–7.7)
Neutro Abs: 10.4 10*3/uL — ABNORMAL HIGH (ref 1.7–7.7)
Neutrophils Relative %: 70 %
Neutrophils Relative %: 82 %
Platelets: 276 10*3/uL (ref 150–400)
Platelets: 264 10*3/uL (ref 150–400)
RBC: 4.84 MIL/uL (ref 4.22–5.81)
RBC: 3.98 MIL/uL — ABNORMAL LOW (ref 4.22–5.81)
RDW: 13.1 % (ref 11.5–15.5)
RDW: 13 % (ref 11.5–15.5)
WBC: 11 10*3/uL — ABNORMAL HIGH (ref 4.0–10.5)
WBC: 12.6 10*3/uL — ABNORMAL HIGH (ref 4.0–10.5)
nRBC: 0 % (ref 0.0–0.2)
nRBC: 0 % (ref 0.0–0.2)

## 2022-04-23 LAB — CBC
HCT: 37.3 % — ABNORMAL LOW (ref 39.0–52.0)
Hemoglobin: 12.6 g/dL — ABNORMAL LOW (ref 13.0–17.0)
MCH: 30.5 pg (ref 26.0–34.0)
MCHC: 33.8 g/dL (ref 30.0–36.0)
MCV: 90.3 fL (ref 80.0–100.0)
Platelets: 246 10*3/uL (ref 150–400)
RBC: 4.13 MIL/uL — ABNORMAL LOW (ref 4.22–5.81)
RDW: 12.9 % (ref 11.5–15.5)
WBC: 10.9 10*3/uL — ABNORMAL HIGH (ref 4.0–10.5)
nRBC: 0 % (ref 0.0–0.2)

## 2022-04-23 LAB — MAGNESIUM
Magnesium: 2 mg/dL (ref 1.7–2.4)
Magnesium: 1.9 mg/dL (ref 1.7–2.4)

## 2022-04-23 LAB — TROPONIN I (HIGH SENSITIVITY)
Troponin I (High Sensitivity): 10 ng/L (ref <18)
Troponin I (High Sensitivity): 12 ng/L (ref <18)
Troponin I (High Sensitivity): 13 ng/L (ref <18)

## 2022-04-23 LAB — PROTIME-INR
INR: 1.5 — ABNORMAL HIGH (ref 0.8–1.2)
Prothrombin Time: 18 seconds — ABNORMAL HIGH (ref 11.4–15.2)

## 2022-04-23 LAB — LACTIC ACID, PLASMA
Lactic Acid, Venous: 2 mmol/L (ref 0.5–1.9)
Lactic Acid, Venous: 1.6 mmol/L (ref 0.5–1.9)

## 2022-04-23 LAB — BASIC METABOLIC PANEL
Anion gap: 7 (ref 5–15)
BUN: 19 mg/dL (ref 8–23)
CO2: 25 mmol/L (ref 22–32)
Calcium: 8.7 mg/dL — ABNORMAL LOW (ref 8.9–10.3)
Chloride: 108 mmol/L (ref 98–111)
Creatinine, Ser: 1.02 mg/dL (ref 0.61–1.24)
GFR, Estimated: >60 mL/min (ref >60)
Glucose, Bld: 121 mg/dL — ABNORMAL HIGH (ref 70–99)
Potassium: 4 mmol/L (ref 3.5–5.1)
Sodium: 140 mmol/L (ref 135–145)

## 2022-04-23 LAB — GLUCOSE, CAPILLARY
Glucose-Capillary: 182 mg/dL — ABNORMAL HIGH (ref 70–99)
Glucose-Capillary: 159 mg/dL — ABNORMAL HIGH (ref 70–99)

## 2022-04-23 LAB — TYPE AND SCREEN
ABO/RH(D): O POS
Antibody Screen: NEGATIVE

## 2022-04-23 SURGERY — EXAM UNDER ANESTHESIA, RECTUM
Anesthesia: General | Site: Rectum

## 2022-04-23 MED ORDER — FENTANYL CITRATE PF 50 MCG/ML IJ SOSY: 25.0000 ug | PREFILLED_SYRINGE | INTRAMUSCULAR | Status: DC | PRN

## 2022-04-23 MED ORDER — SUCCINYLCHOLINE CHLORIDE 200 MG/10ML IV SOSY
PREFILLED_SYRINGE | INTRAVENOUS | Status: AC
  Filled 2022-04-23: qty 10

## 2022-04-23 MED ORDER — ROCURONIUM BROMIDE 100 MG/10ML IV SOLN
INTRAVENOUS | Status: DC | PRN
  Administered 2022-04-23: 100 mg via INTRAVENOUS

## 2022-04-23 MED ORDER — DEXAMETHASONE SODIUM PHOSPHATE 10 MG/ML IJ SOLN
INTRAMUSCULAR | Status: AC
  Filled 2022-04-23: qty 1

## 2022-04-23 MED ORDER — LIDOCAINE HCL (CARDIAC) PF 100 MG/5ML IV SOSY
PREFILLED_SYRINGE | INTRAVENOUS | Status: DC | PRN
Start: 2022-04-23 — End: 2022-04-23
  Administered 2022-04-23: 100 mg via INTRAVENOUS

## 2022-04-23 MED ORDER — ROCURONIUM BROMIDE 10 MG/ML (PF) SYRINGE
PREFILLED_SYRINGE | INTRAVENOUS | Status: AC
  Filled 2022-04-23: qty 10

## 2022-04-23 MED ORDER — TAMSULOSIN HCL 0.4 MG PO CAPS
0.4000 mg | ORAL_CAPSULE | Freq: Every day | ORAL | Status: DC
  Administered 2022-04-24 – 2022-04-27 (×4): 0.4 mg via ORAL
  Filled 2022-04-23 (×5): qty 1

## 2022-04-23 MED ORDER — SUGAMMADEX SODIUM 500 MG/5ML IV SOLN
INTRAVENOUS | Status: DC | PRN
  Administered 2022-04-23: 500 mg via INTRAVENOUS

## 2022-04-23 MED ORDER — PROPOFOL 10 MG/ML IV BOLUS
INTRAVENOUS | Status: DC | PRN
Start: 2022-04-23 — End: 2022-04-23
  Administered 2022-04-23: 140 mg via INTRAVENOUS

## 2022-04-23 MED ORDER — ROSUVASTATIN CALCIUM 10 MG PO TABS
20.0000 mg | ORAL_TABLET | Freq: Every evening | ORAL | Status: DC
  Administered 2022-04-23 – 2022-04-26 (×4): 20 mg via ORAL
  Filled 2022-04-23 (×2): qty 2
  Filled 2022-04-23: qty 1
  Filled 2022-04-23 (×2): qty 2

## 2022-04-23 MED ORDER — OXYCODONE HCL 5 MG PO TABS: 5.0000 mg | ORAL_TABLET | Freq: Once | ORAL | Status: DC | PRN

## 2022-04-23 MED ORDER — BUPIVACAINE-EPINEPHRINE 0.5% -1:200000 IJ SOLN
INTRAMUSCULAR | Status: DC | PRN
  Administered 2022-04-23: 30 mL

## 2022-04-23 MED ORDER — METHOCARBAMOL 500 MG PO TABS: 500.0000 mg | ORAL_TABLET | Freq: Three times a day (TID) | ORAL | Status: DC | PRN

## 2022-04-23 MED ORDER — METOPROLOL TARTRATE 25 MG PO TABS
25.0000 mg | ORAL_TABLET | Freq: Two times a day (BID) | ORAL | Status: DC
  Administered 2022-04-23 – 2022-04-27 (×5): 25 mg via ORAL
  Filled 2022-04-23 (×8): qty 1

## 2022-04-23 MED ORDER — LORATADINE 10 MG PO TABS
10.0000 mg | ORAL_TABLET | Freq: Every day | ORAL | Status: DC
  Administered 2022-04-24 – 2022-04-27 (×4): 10 mg via ORAL
  Filled 2022-04-23 (×4): qty 1

## 2022-04-23 MED ORDER — SUGAMMADEX SODIUM 500 MG/5ML IV SOLN
INTRAVENOUS | Status: AC
  Filled 2022-04-23: qty 5

## 2022-04-23 MED ORDER — LIDOCAINE HCL (PF) 2 % IJ SOLN
INTRAMUSCULAR | Status: AC
  Filled 2022-04-23: qty 5

## 2022-04-23 MED ORDER — ONDANSETRON HCL 4 MG/2ML IJ SOLN
INTRAMUSCULAR | Status: DC | PRN
  Administered 2022-04-23: 4 mg via INTRAVENOUS

## 2022-04-23 MED ORDER — 0.9 % SODIUM CHLORIDE (POUR BTL) OPTIME
TOPICAL | Status: DC | PRN
  Administered 2022-04-23: 1000 mL

## 2022-04-23 MED ORDER — BUPIVACAINE LIPOSOME 1.3 % IJ SUSP
INTRAMUSCULAR | Status: DC | PRN
  Administered 2022-04-23: 20 mL

## 2022-04-23 MED ORDER — FENTANYL CITRATE (PF) 100 MCG/2ML IJ SOLN: 25.0000 ug | INTRAMUSCULAR | Status: DC | PRN

## 2022-04-23 MED ORDER — PANTOPRAZOLE SODIUM 40 MG IV SOLR
40.0000 mg | Freq: Every day | INTRAVENOUS | Status: DC
  Administered 2022-04-23 – 2022-04-26 (×4): 40 mg via INTRAVENOUS
  Filled 2022-04-23 (×5): qty 10

## 2022-04-23 MED ORDER — HYDROMORPHONE HCL 1 MG/ML IJ SOLN: 0.5000 mg | INTRAMUSCULAR | Status: DC | PRN

## 2022-04-23 MED ORDER — PHENYLEPHRINE HCL (PRESSORS) 10 MG/ML IV SOLN
INTRAVENOUS | Status: AC
  Filled 2022-04-23: qty 1

## 2022-04-23 MED ORDER — LACTATED RINGERS IV SOLN
INTRAVENOUS | Status: DC
Start: 2022-04-23 — End: 2022-04-23

## 2022-04-23 MED ORDER — GELATIN ABSORBABLE 100 EX MISC
CUTANEOUS | Status: DC | PRN
Start: 2022-04-23 — End: 2022-04-23
  Administered 2022-04-23: 1

## 2022-04-23 MED ORDER — LIDOCAINE 5 % EX OINT: 1.0000 "application " | TOPICAL_OINTMENT | Freq: Three times a day (TID) | CUTANEOUS | Status: DC | PRN

## 2022-04-23 MED ORDER — OXYCODONE HCL 5 MG PO TABS: 5.0000 mg | ORAL_TABLET | ORAL | Status: DC | PRN

## 2022-04-23 MED ORDER — POLYETHYLENE GLYCOL 3350 17 G PO PACK: 17.0000 g | PACK | Freq: Every day | ORAL | Status: DC | PRN

## 2022-04-23 MED ORDER — LACTATED RINGERS IV SOLN: INTRAVENOUS | Status: DC | PRN

## 2022-04-23 MED ORDER — PROTHROMBIN COMPLEX CONC HUMAN 500 UNITS IV KIT
2562.0000 [IU] | PACK | Status: AC
  Administered 2022-04-23: 2562 [IU] via INTRAVENOUS
  Filled 2022-04-23: qty 2562

## 2022-04-23 MED ORDER — ONDANSETRON HCL 4 MG/2ML IJ SOLN: 4.0000 mg | Freq: Once | INTRAMUSCULAR | Status: DC | PRN

## 2022-04-23 MED ORDER — OXYCODONE HCL 5 MG/5ML PO SOLN: 5.0000 mg | Freq: Once | ORAL | Status: DC | PRN

## 2022-04-23 MED ORDER — PHENYLEPHRINE HCL (PRESSORS) 10 MG/ML IV SOLN
INTRAVENOUS | Status: DC | PRN
  Administered 2022-04-23: 200 ug via INTRAVENOUS
  Administered 2022-04-23: 100 ug via INTRAVENOUS
  Administered 2022-04-23: 50 ug via INTRAVENOUS
  Administered 2022-04-23: 200 ug via INTRAVENOUS
  Administered 2022-04-23: 100 ug via INTRAVENOUS

## 2022-04-23 MED ORDER — PROPOFOL 10 MG/ML IV BOLUS
INTRAVENOUS | Status: AC
  Filled 2022-04-23: qty 20

## 2022-04-23 MED ORDER — DEXAMETHASONE SODIUM PHOSPHATE 10 MG/ML IJ SOLN
INTRAMUSCULAR | Status: DC | PRN
  Administered 2022-04-23: 4 mg via INTRAVENOUS

## 2022-04-23 MED ORDER — FLUTICASONE PROPIONATE 50 MCG/ACT NA SUSP
2.0000 | Freq: Every day | NASAL | Status: DC
  Filled 2022-04-23: qty 16

## 2022-04-23 MED ORDER — ONDANSETRON 4 MG PO TBDP
4.0000 mg | ORAL_TABLET | Freq: Four times a day (QID) | ORAL | Status: DC | PRN
Start: 2022-04-23 — End: 2022-04-27

## 2022-04-23 MED ORDER — FENTANYL CITRATE (PF) 100 MCG/2ML IJ SOLN
INTRAMUSCULAR | Status: DC | PRN
Start: 2022-04-23 — End: 2022-04-23
  Administered 2022-04-23: 50 ug via INTRAVENOUS

## 2022-04-23 MED ORDER — ACETAMINOPHEN 500 MG PO TABS
1000.0000 mg | ORAL_TABLET | Freq: Four times a day (QID) | ORAL | Status: DC
  Administered 2022-04-23 – 2022-04-27 (×14): 1000 mg via ORAL
  Filled 2022-04-23 (×14): qty 2

## 2022-04-23 MED ORDER — ONDANSETRON HCL 4 MG/2ML IJ SOLN: 4.0000 mg | Freq: Four times a day (QID) | INTRAMUSCULAR | Status: DC | PRN

## 2022-04-23 MED ORDER — FENTANYL CITRATE (PF) 100 MCG/2ML IJ SOLN
INTRAMUSCULAR | Status: AC
  Filled 2022-04-23: qty 2

## 2022-04-23 MED ORDER — BUPROPION HCL ER (XL) 150 MG PO TB24
150.0000 mg | ORAL_TABLET | Freq: Every morning | ORAL | Status: DC
Start: 2022-04-23 — End: 2022-04-27
  Administered 2022-04-23 – 2022-04-27 (×5): 150 mg via ORAL
  Filled 2022-04-23 (×5): qty 1

## 2022-04-23 MED ORDER — BUSPIRONE HCL 10 MG PO TABS
10.0000 mg | ORAL_TABLET | Freq: Two times a day (BID) | ORAL | Status: DC
  Administered 2022-04-23 – 2022-04-27 (×8): 10 mg via ORAL
  Filled 2022-04-23 (×8): qty 1

## 2022-04-23 MED ORDER — ONDANSETRON HCL 4 MG/2ML IJ SOLN
INTRAMUSCULAR | Status: AC
  Filled 2022-04-23: qty 2

## 2022-04-23 MED ORDER — AMITRIPTYLINE HCL 25 MG PO TABS
25.0000 mg | ORAL_TABLET | Freq: Every evening | ORAL | Status: DC | PRN
  Administered 2022-04-24: 25 mg via ORAL
  Filled 2022-04-23 (×2): qty 1

## 2022-04-23 MED ORDER — IRBESARTAN 150 MG PO TABS
75.0000 mg | ORAL_TABLET | Freq: Every day | ORAL | Status: DC
Start: 2022-04-23 — End: 2022-04-24
  Administered 2022-04-23 – 2022-04-24 (×2): 75 mg via ORAL
  Filled 2022-04-23 (×3): qty 1

## 2022-04-23 SURGICAL SUPPLY — 37 items
CUP MEDICINE 2OZ PLAST GRAD ST (MISCELLANEOUS) ×1 IMPLANT
DRAPE PERI LITHO V/GYN (MISCELLANEOUS) ×2 IMPLANT
DRSG GAUZE FLUFF 36X18 (GAUZE/BANDAGES/DRESSINGS) ×3 IMPLANT
ELECT CAUTERY BLADE TIP 2.5 (TIP) ×2
ELECT REM PT RETURN 9FT ADLT (ELECTROSURGICAL) ×2
ELECTRODE CAUTERY BLDE TIP 2.5 (TIP) ×1 IMPLANT
ELECTRODE REM PT RTRN 9FT ADLT (ELECTROSURGICAL) ×1 IMPLANT
GAUZE 4X4 16PLY ~~LOC~~+RFID DBL (SPONGE) ×2 IMPLANT
GLOVE SURG SYN 6.5 ES PF (GLOVE) ×8 IMPLANT
GLOVE SURG SYN 6.5 PF PI (GLOVE) IMPLANT
GLOVE SURG SYN 7.0 (GLOVE) ×2 IMPLANT
GLOVE SURG SYN 7.0 PF PI (GLOVE) ×1 IMPLANT
GLOVE SURG SYN 7.5  E (GLOVE) ×1
GLOVE SURG SYN 7.5 E (GLOVE) ×1 IMPLANT
GLOVE SURG SYN 7.5 PF PI (GLOVE) ×1 IMPLANT
GOWN STRL REUS W/ TWL LRG LVL3 (GOWN DISPOSABLE) ×2 IMPLANT
GOWN STRL REUS W/TWL LRG LVL3 (GOWN DISPOSABLE) ×3
KIT TURNOVER KIT A (KITS) ×2 IMPLANT
LABEL OR SOLS (LABEL) ×2 IMPLANT
MANIFOLD NEPTUNE II (INSTRUMENTS) ×2 IMPLANT
MAT ABSORB  FLUID 56X50 GRAY (MISCELLANEOUS) ×1
MAT ABSORB FLUID 56X50 GRAY (MISCELLANEOUS) IMPLANT
NEEDLE HYPO 22GX1.5 SAFETY (NEEDLE) ×2 IMPLANT
NS IRRIG 500ML POUR BTL (IV SOLUTION) ×2 IMPLANT
PACK BASIN MINOR ARMC (MISCELLANEOUS) ×2 IMPLANT
PAD OB MATERNITY 4.3X12.25 (PERSONAL CARE ITEMS) ×2 IMPLANT
PAD PREP 24X41 OB/GYN DISP (PERSONAL CARE ITEMS) ×3 IMPLANT
PANTS MESH DISP 2XL (UNDERPADS AND DIAPERS) ×1 IMPLANT
PANTS MESH DISPOSABLE 2XL (UNDERPADS AND DIAPERS) ×1
SOL PREP PVP 2OZ (MISCELLANEOUS) ×2
SOLUTION PREP PVP 2OZ (MISCELLANEOUS) ×1 IMPLANT
SURGILUBE 2OZ TUBE FLIPTOP (MISCELLANEOUS) ×2 IMPLANT
SUT VIC AB 2-0 SH 27 (SUTURE) ×2
SUT VIC AB 2-0 SH 27XBRD (SUTURE) ×2 IMPLANT
SYR 10ML LL (SYRINGE) ×4 IMPLANT
SYR BULB IRRIG 60ML STRL (SYRINGE) ×2 IMPLANT
WATER STERILE IRR 500ML POUR (IV SOLUTION) ×2 IMPLANT

## 2022-04-23 NOTE — Transfer of Care (Signed)
Immediate Anesthesia Transfer of Care Note  Patient: Tom Underwood  Procedure(s) Performed: RECTAL EXAM UNDER ANESTHESIA WITH LIGATION OF BLEEDING (Rectum)  Patient Location: PACU  Anesthesia Type:General  Level of Consciousness: awake, alert  and oriented  Airway & Oxygen Therapy: Patient Spontanous Breathing and Patient connected to nasal cannula oxygen  Post-op Assessment: Report given to RN and Post -op Vital signs reviewed and stable  Post vital signs: Reviewed and stable  Last Vitals:  Vitals Value Taken Time  BP 109/64 04/23/22 1550  Temp 36.2 C 04/23/22 1551  Pulse 85 04/23/22 1551  Resp 19 04/23/22 1551  SpO2 95 % 04/23/22 1551  Vitals shown include unvalidated device data.  Last Pain:  Vitals:   04/23/22 1421  TempSrc:   PainSc: 0-No pain         Complications: No notable events documented.

## 2022-04-23 NOTE — Progress Notes (Addendum)
Cross Cover Responded to Rapid Response Call Patient reported got dizzy, started to drop his heart rate  and lost pulse after standing up on side of bed. According to accompanying nurse he became pulseless and unresponsive.  Received 2 compressions.  Patient is post re exploration of hemorrhoidectomy procedure for concerns for bleeding Patient required no CPR or ACLS drugs, pulse returned, mentation at bbaseline, hypotensive and pale. Called informed /discussed with Dr. Aleen Campi Transfer to ICU Stat labs CBC. cMP. MAG and EKG ICU team to be consulted by him
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
## 2022-04-23 NOTE — Anesthesia Postprocedure Evaluation (Signed)
Anesthesia Post Note  Patient: Tom Underwood  Procedure(s) Performed: RECTAL EXAM UNDER ANESTHESIA WITH LIGATION OF BLEEDING (Rectum)  Patient location during evaluation: PACU Anesthesia Type: General Level of consciousness: awake and alert Pain management: pain level controlled Vital Signs Assessment: post-procedure vital signs reviewed and stable Respiratory status: spontaneous breathing, nonlabored ventilation, respiratory function stable and patient connected to nasal cannula oxygen Cardiovascular status: blood pressure returned to baseline and stable Postop Assessment: no apparent nausea or vomiting Anesthetic complications: no   No notable events documented.   Last Vitals:  Vitals:   04/23/22 1630 04/23/22 1701  BP: 118/73 118/78  Pulse: 83 91  Resp: (!) 30 20  Temp: (!) 36.4 C 37.3 C  SpO2: 97% 97%    Last Pain:  Vitals:   04/23/22 1701  TempSrc: Oral  PainSc:                  Karleen Hampshire

## 2022-04-23 NOTE — Progress Notes (Signed)
eLink Physician-Brief Progress Note Patient Name: Tom Underwood DOB: 05-10-1951 MRN: [id number]   Date of Service  04/23/2022  HPI/Events of Note  Patient had a hemorrhoidectomy a week ago and resumed oral anticoagulation 2 days ago, presented today with rectal bleeding, hypotension, and acute blood loss anemia, he transiently was unresponsive earlier this evening, and was transferred to the ICU for closer monitoring.  eICU Interventions  New Patient Evaluation.        Migdalia Dk 04/23/2022, 8:54 PM

## 2022-04-23 NOTE — ED Provider Notes (Signed)
[facility] Provider Note    Event Date/Time   First MD Initiated Contact with Patient 04/23/22 1219     (approximate)   History   Rectal Bleeding (Hemmrhoid surgery 9 days prior, red drainage /blood leaking , bright in color , ems stated, pt on thinners on and off 129/64, 500 cc ns by ems ,blood sugar 135/5)   HPI  Tom Underwood is a 71 y.o. male with history of atrial fibrillation on Xarelto, hypertension, CVA, OSA on CPAP, and recent hemorrhoid surgery who presents with blood in the stool, acute onset this morning, described as a large amount of blood and clots.  The patient states that he had the hemorrhoid surgery 8 days ago.  He has had a couple of normal bowel movements since that time, but then today initially had a small amount of blood and then felt some gas and had a bowel movement with passing of a large blood clot.  He describes the blood is fairly bright.  EMS confirms that there was a large amount of blood at the scene.  The patient reports associated shortness of breath since this started but denies chest pain.  He has no abdominal pain or fever.  He has no vomiting    Physical Exam   Triage Vital Signs: ED Triage Vitals  Enc Vitals Group     BP 04/23/22 1213 133/90     Pulse Rate 04/23/22 1213 67     Resp 04/23/22 1213 17     Temp 04/23/22 1213 98.5 F (36.9 C)     Temp Source 04/23/22 1213 Oral     SpO2 04/23/22 1213 100 %     Weight 04/23/22 1218 284 lb 6.3 oz (129 kg)     Height 04/23/22 1218 5\' 10"  (1.778 m)     Head Circumference --      Peak Flow --      Pain Score 04/23/22 1218 6     Pain Loc --      Pain Edu? --      Excl. in [place]? --     Most recent vital signs: Vitals:   04/23/22 1213 04/23/22 1421  BP: 133/90 108/73  Pulse: 67 89  Resp: 17 (!) 23  Temp: 98.5 F (36.9 C) 98.4 F (36.9 C)  SpO2: 100% 98%     General: Alert and oriented, anxious appearing but in no acute distress. CV:  Good peripheral  perfusion.  Normal heart sounds. Resp:  Intermittently increased respiratory effort, lungs CTA B. Abd:  Soft and nontender no distention.  Other:  Normal external rectal exam except for the presence of maroon-colored clots which were removed.  No visible external hemorrhoid.  No active bleeding from the anus.   ED Results / Procedures / Treatments   Labs (all labs ordered are listed, but only abnormal results are displayed) Labs Reviewed  CBC WITH DIFFERENTIAL/PLATELET - Abnormal; Notable for the following components:      Result Value   WBC 11.0 (*)    All other components within normal limits  LACTIC ACID, PLASMA - Abnormal; Notable for the following components:   Lactic Acid, Venous 2.0 (*)    All other components within normal limits  LACTIC ACID, PLASMA  COMPREHENSIVE METABOLIC PANEL  PROTIME-INR  HIV ANTIBODY (ROUTINE TESTING W REFLEX)  TYPE AND SCREEN  TYPE AND SCREEN  TROPONIN I (HIGH SENSITIVITY)  TROPONIN I (HIGH SENSITIVITY)     EKG  ED ECG REPORT I,  Arta Silence, the attending physician, personally viewed and interpreted this ECG.  Date: 04/23/2022 EKG Time: 1236 Rate: 63 Rhythm: normal sinus rhythm QRS Axis: normal Intervals: normal ST/T Wave abnormalities: Nonspecific ST abnormalities inferiorly Narrative Interpretation: Nonspecific abnormalities with no evidence of acute ischemia    RADIOLOGY  Chest x-ray: I independently viewed and interpreted the images; there is no focal consolidation or edema  PROCEDURES:  Critical Care performed: Yes, see critical care procedure note(s)  .Critical Care Performed by: Arta Silence, MD Authorized by: Arta Silence, MD   Critical care provider statement:    Critical care time (minutes):  30   Critical care was necessary to treat or prevent imminent or life-threatening deterioration of the following conditions:  Circulatory failure and shock   Critical care was time spent personally by me  on the following activities:  Development of treatment plan with patient or surrogate, discussions with consultants, evaluation of patient's response to treatment, examination of patient, ordering and review of laboratory studies, ordering and review of radiographic studies, ordering and performing treatments and interventions, pulse oximetry, re-evaluation of patient's condition and review of old charts   Care discussed with: admitting provider     MEDICATIONS ORDERED IN ED: Medications  acetaminophen (TYLENOL) tablet 1,000 mg ( Oral Automatically Held 05/01/22 1800)  oxyCODONE (Oxy IR/ROXICODONE) immediate release tablet 5-10 mg ( Oral MAR Hold 04/23/22 1441)  HYDROmorphone (DILAUDID) injection 0.5 mg ( Intravenous MAR Hold 04/23/22 1441)  polyethylene glycol (MIRALAX / GLYCOLAX) packet 17 g ( Oral MAR Hold 04/23/22 1441)  ondansetron (ZOFRAN-ODT) disintegrating tablet 4 mg ( Oral MAR Hold 04/23/22 1441)    Or  ondansetron (ZOFRAN) injection 4 mg ( Intravenous MAR Hold 04/23/22 1441)  pantoprazole (PROTONIX) injection 40 mg ( Intravenous Automatically Held 05/01/22 2200)  lactated ringers infusion (has no administration in time range)  amitriptyline (ELAVIL) tablet 25 mg ( Oral MAR Hold 04/23/22 1441)  buPROPion (WELLBUTRIN XL) 24 hr tablet 150 mg ( Oral Automatically Held 05/01/22 1000)  busPIRone (BUSPAR) tablet 10 mg ( Oral Automatically Held 05/01/22 2200)  loratadine (CLARITIN) tablet 10 mg ( Oral Automatically Held 05/01/22 1000)  fluticasone (FLONASE) 50 MCG/ACT nasal spray 2 spray ( Each Nare Automatically Held 05/01/22 1000)  lidocaine (XYLOCAINE) 5 % ointment 1 application. ( Topical MAR Hold 04/23/22 1441)  methocarbamol (ROBAXIN) tablet 500 mg ( Oral MAR Hold 04/23/22 1441)  metoprolol tartrate (LOPRESSOR) tablet 25 mg ( Oral Automatically Held 05/01/22 2200)  rosuvastatin (CRESTOR) tablet 20 mg ( Oral Automatically Held 05/01/22 1800)  tamsulosin (FLOMAX) capsule 0.4 mg ( Oral Automatically  Held 05/01/22 1000)  irbesartan (AVAPRO) tablet 75 mg ( Oral Automatically Held 05/01/22 1000)  prothrombin complex conc human (KCENTRA) IVPB 2,562 Units (2,562 Units Intravenous New Bag/Given 04/23/22 1326)     IMPRESSION / MDM / ASSESSMENT AND PLAN / ED COURSE  I reviewed the triage vital signs and the nursing notes.  71 year old male with PMH as noted above presents with acute onset of maroon blood and clots in his stool after hemorrhoid surgery last week.  He has no vomiting or abdominal pain.  I reviewed the past medical record and confirmed the exam under anesthesia and hemorrhoidectomy on 04/15/2022.  The procedure was uncomplicated.  On exam, the vital signs are normal.  The patient is very anxious appearing and intermittently demonstrates increased respiratory effort and hyperventilation although his O2 saturation remains normal.  It improves when he is verbally reassured.  The abdomen is soft and nontender.  External  rectal exam is normal except for the presence of some clots.  There is no blood actively coming out although the patient intermittently feels more blood.  Differential diagnosis includes, but is not limited to, bleeding internal hemorrhoid, other postsurgical bleeding, diverticulosis, other source of lower GI bleed.  I do not suspect upper GI bleed at this time.  I consulted Dr. Hampton Abbot from general surgery and discussed the case with him.  He will come to evaluate the patient.  He recommends reversing the patient's anticoagulation.  He last took Xarelto last night around 9 PM.  I consulted with the pharmacist and have ordered Kcentra per pharmacy dosage recommendations.  We will continue to monitor the patient closely, obtain lab work-up, transfuse if needed, and await recommendations from surgery.  The patient is on the cardiac monitor to evaluate for evidence of arrhythmia and/or significant heart rate changes.  ----------------------------------------- 2:43 PM on  04/23/2022 -----------------------------------------  Initial hemoglobin is normal.  Lactic acid was slightly elevated although there is no evidence of acute ischemia or sepsis.  Chest x-ray showed no acute findings and the patient's shortness of breath resolved without intervention.  Dr. Hampton Abbot evaluated the patient in the ED and recommended admission to the OR for further evaluation.   FINAL CLINICAL IMPRESSION(S) / ED DIAGNOSES   Final diagnoses:  Lower GI bleed     Rx / DC Orders   ED Discharge Orders     None        Note:  This document was prepared using Dragon voice recognition software and may include unintentional dictation errors.    Arta Silence, MD 04/23/22 [phone]

## 2022-04-23 NOTE — ED Triage Notes (Signed)
Hemorrhoid recent surgery , since bleeding , in excess, vs stable no co pain

## 2022-04-23 NOTE — Anesthesia Procedure Notes (Signed)
Procedure Name: Intubation Date/Time: 04/23/2022 2:53 PM Performed by: Sherrine Maples, CRNA Pre-anesthesia Checklist: Patient identified, Patient being monitored, Timeout performed, Emergency Drugs available and Suction available Patient Re-evaluated:Patient Re-evaluated prior to induction Oxygen Delivery Method: Circle system utilized Preoxygenation: Pre-oxygenation with 100% oxygen Induction Type: IV induction Laryngoscope Size: 3 and McGraph Grade View: Grade I Tube type: Oral Tube size: 7.5 mm Number of attempts: 1 Airway Equipment and Method: Stylet Placement Confirmation: ETT inserted through vocal cords under direct vision, positive ETCO2 and breath sounds checked- equal and bilateral Secured at: 21 cm Tube secured with: Tape Dental Injury: Teeth and Oropharynx as per pre-operative assessment

## 2022-04-23 NOTE — Progress Notes (Signed)
Notified patient wife Tom Underwood that he is in room 201 on the second floor, and is doing great

## 2022-04-23 NOTE — Op Note (Signed)
Procedure Date:  04/23/2022  Pre-operative Diagnosis:  Bright red blood per rectum, s/p hemorrhoidectomy  Post-operative Diagnosis:  Bleeding internal hemorrhoid pedicle  Procedure:  Exam under Anesthesia with suture ligation of right anterior and left lateral internal hemorrhoid pedicles -- total 4 suture ligations done.  Surgeon:  Howie Ill, MD  Anesthesia:  General endotracheal  Estimated Blood Loss:  200 ml blood evacuated was from his GI bleed, and not from the surgery itself.  Specimens: None  Complications:  None  Indications for Procedure:  This is a 71 y.o. male with a history of hemorrhoidectomy on 04/15/22, who presents with acute bleeding that started this morning.  He is on Xarelto which was reversed in the Emergency Department, and discussed with him the need for exam under anesthesia to evaluate the source of bleeding and repair it.  The risks of bleeding, infection, bowel injury, and need for further procedures were all discussed with the patient and he was willing to proceed.   Description of Procedure: The patient was correctly identified in the preoperative area and brought into the operating room.  The patient was placed supine with VTE prophylaxis in place.  Appropriate time-outs were performed.  Anesthesia was induced and the patient was intubated.  Appropriate antibiotics were infused.  The patient was then placed in high lithotomy position.   The perianal area was prepped and draped in usual sterile fashion.  The sphincter was digitally dilated.  This resulted in a gush of mixture of bright red blood with clots.  Then the anoscope was inserted and the anal canal was evaluated.  Approximately 200 ml of blood and clots were evacuated from the anal canal.  The anal canal was then irrigated.  After further evaluation, it was noted that the patient had active bleeding from the right anterior internal hemorrhoid pedicle.  This was suture ligated using 3-0 Vicryl.  After  that, the bleeding substantially resolved.  Further evaluation showed two areas of very mild oozing from the left lateral pedicle, and these were individually suture ligated as well with 3-0 Vicryl.  Finally, one additional area in the right anterior column showed an adherent clot, so as a precaution, it was ligated as well with 3-0 Vicryl.  Following that, the anal canal was irrigated once again and there was no further bleeding.  From time to time, could see blood coming from the rectum, more proximal from the anal canal, which is likely blood that went retrograde instead of additional areas of bleeding.  The area was irrigated again, and a large gelfoam was rolled and placed inside the anal canal.  50 ml of Exparel solution was infiltrated into the perianal area and as bilateral pudendal blocks.  The perianal area was cleaned and dressed with fluffed gauze and mesh underwear.  The patient was emerged from anesthesia and extubated and brought to the recovery room for further management.  The patient tolerated the procedure well and all counts were correct at the end of the case.   Howie Ill, MD

## 2022-04-23 NOTE — Anesthesia Preprocedure Evaluation (Addendum)
Anesthesia Evaluation  Patient identified by MRN, date of birth, ID band Patient awake    Reviewed: Allergy & Precautions, H&P , NPO status , Patient's Chart, lab work & pertinent test results  History of Anesthesia Complications Negative for: history of anesthetic complications  Airway Mallampati: III  TM Distance: >3 FB Neck ROM: full    Dental  (+) Missing,    Pulmonary sleep apnea , neg COPD, former smoker,   Tachypneic  breath sounds clear to auscultation       Cardiovascular hypertension, (-) angina(-) Past MI, (-) Cardiac Stents, (-) CABG and (-) CHF (-) dysrhythmias  Rhythm:regular Rate:Normal     Neuro/Psych PSYCHIATRIC DISORDERS Anxiety Depression CVA (right leg and right arm weakness; walks with cane), Residual Symptoms    GI/Hepatic Neg liver ROS, GERD  ,S/p hemorrhoidectomy on 5/12, now with post op hemorrhage   Endo/Other  Morbid obesity  Renal/GU negative Renal ROS  negative genitourinary   Musculoskeletal  (+) Arthritis ,   Abdominal   Peds  Hematology Anticoagulated on Xarelto Received KCentra  Hgb stable at 14   Anesthesia Other Findings Past Medical History: No date: Anxiety No date: Arthritis No date: Atrial fibrillation (HCC)     Comment:  a.) CHA2DS2-VASc = 4 (age, HTN, CVA x2). b.) s/p 200 J               synchronized cardioversion (DCCV) on 12/08/2021. c.)               rate/rythum maintained on oral metoprolol tartrate;               chronically anticoagulated using rivaroxaban. No date: BPH (benign prostatic hyperplasia) No date: Complication of anesthesia     Comment:  a.)  Intolerant of propofol; causes "skin burning and               involuntary muscle twitching" No date: DDD (degenerative disc disease), lumbar No date: Decreased libido No date: Depression No date: Diverticulitis No date: Diverticulosis No date: GERD (gastroesophageal reflux disease) No date: Hepatic  steatosis 12/28/2020: History of 2019 novel coronavirus disease (COVID-19) No date: History of cardiac murmur in childhood No date: History of kidney stones No date: HLD (hyperlipidemia) No date: HLD (hyperlipidemia) No date: Hypertension No date: Hypogonadism in male No date: Long term current use of anticoagulant     Comment:  a.) rivaroxaban No date: OSA on CPAP No date: Pre-diabetes No date: Rhinitis, allergic 07/23/2019: Stroke (HCC)     Comment:  a.) LEFT posterior cor radiata. b.) residual RIGHT sided              weakness  Past Surgical History: 12/08/2021: CARDIOVERSION; N/A     Comment:  Procedure: CARDIOVERSION;  Surgeon: Yvonne Kendall,               MD;  Location: [facility] ORS;  Service: Cardiovascular;                Laterality: N/A; No date: COLONOSCOPY 04/15/2022: EVALUATION UNDER ANESTHESIA WITH HEMORRHOIDECTOMY; N/A     Comment:  Procedure: EXAM UNDER ANESTHESIA WITH HEMORRHOIDECTOMY;               Surgeon: Sung Amabile, DO;  Location: [facility] ORS;  Service:              General;  Laterality: N/A; No date: EXTERNAL EAR SURGERY No date: EYE SURGERY No date: FINGER SURGERY; Right     Comment:  lateration to 5 th digit  No date: KNEE SURGERY No date: left eye  BMI    Body Mass Index: 40.81 kg/m      Reproductive/Obstetrics negative OB ROS                            Anesthesia Physical Anesthesia Plan  ASA: 3  Anesthesia Plan: General ETT   Post-op Pain Management:    Induction:   PONV Risk Score and Plan: Ondansetron, Dexamethasone and Treatment may vary due to age or medical condition  Airway Management Planned: Oral ETT  Additional Equipment:   Intra-op Plan:   Post-operative Plan: Extubation in OR  Informed Consent: I have reviewed the patients History and Physical, chart, labs and discussed the procedure including the risks, benefits and alternatives for the proposed anesthesia with the patient or authorized  representative who has indicated his/her understanding and acceptance.     Dental Advisory Given  Plan Discussed with: Anesthesiologist, CRNA and Surgeon  Anesthesia Plan Comments:         Anesthesia Quick Evaluation

## 2022-04-23 NOTE — Progress Notes (Signed)
Assisted patient with urinal. Patient was able to stand up on the side of bed. While standing up, he started passing clotted blood chunks through his rectum. after he finished with urinal, he verbalized feeling dizzy so he sat down on the side of the bed. Once sat down he lost consciousness. Rapid was called. Followed by code blue due to no pulse assessed.  2-3 chest compressions given. Pt gasped for air, HR noted back on monitor and patient recovered consciousness.    Pt does not recalled event.  RRT at bedside.   Wife at bedside as well.

## 2022-04-23 NOTE — H&P (Signed)
Date of Admission:  04/23/2022  Reason for Admission:  Rectal bleeding  History of Present Illness: Tom Underwood is a 71 y.o. male s/p hemorrhoidectomy with Dr. Tonna Boehringer on 04/15/22, presenting today with acute onset of bright red blood per rectum.  He's on Xarelto for atrial fibrillation.  He called the answering service this morning and I spoke with him.  Patient reported that he has some bleeding two days ago, and then today he had very profuse bleeding that he noted in his bed, on the floor on his way to the toilet, and in the toilet bowl.  He called EMS and was brought to the ED.  Hgb is 14.7 which is decreased from preop of 16.7.  WBC 11, lactic acid 2.0.  Denies any pain, chest pain, shortness of breath, but does appear anxious.  Past Medical History: Past Medical History:  Diagnosis Date   Anxiety    Arthritis    Atrial fibrillation (HCC)    a.) CHA2DS2-VASc = 4 (age, HTN, CVA x2). b.) s/p 200 J synchronized cardioversion (DCCV) on 12/08/2021. c.) rate/rythum maintained on oral metoprolol tartrate; chronically anticoagulated using rivaroxaban.   BPH (benign prostatic hyperplasia)    Complication of anesthesia    a.)  Intolerant of propofol; causes "skin burning and involuntary muscle twitching"   DDD (degenerative disc disease), lumbar    Decreased libido    Depression    Diverticulitis    Diverticulosis    GERD (gastroesophageal reflux disease)    Hepatic steatosis    History of 2019 novel coronavirus disease (COVID-19) 12/28/2020   History of cardiac murmur in childhood    History of kidney stones    HLD (hyperlipidemia)    HLD (hyperlipidemia)    Hypertension    Hypogonadism in male    Long term current use of anticoagulant    a.) rivaroxaban   OSA on CPAP    Pre-diabetes    Rhinitis, allergic    Stroke (HCC) 07/23/2019   a.) LEFT posterior cor radiata. b.) residual RIGHT sided weakness     Past Surgical History: Past Surgical History:  Procedure Laterality Date    CARDIOVERSION N/A 12/08/2021   Procedure: CARDIOVERSION;  Surgeon: Yvonne Kendall, MD;  Location: [facility] ORS;  Service: Cardiovascular;  Laterality: N/A;   COLONOSCOPY     EVALUATION UNDER ANESTHESIA WITH HEMORRHOIDECTOMY N/A 04/15/2022   Procedure: EXAM UNDER ANESTHESIA WITH HEMORRHOIDECTOMY;  Surgeon: Sung Amabile, DO;  Location: [facility] ORS;  Service: General;  Laterality: N/A;   EXTERNAL EAR SURGERY     EYE SURGERY     FINGER SURGERY Right    lateration to 5 th digit   KNEE SURGERY     left eye      Home Medications: Prior to Admission medications   Medication Sig Start Date End Date Taking? Authorizing Provider  acetaminophen (TYLENOL) 325 MG tablet Take 2 tablets (650 mg total) by mouth every 8 (eight) hours as needed for mild pain. 04/15/22 05/15/22  Sung Amabile, DO  acetaminophen (TYLENOL) 650 MG CR tablet Take 1,300 mg by mouth every evening.    [provider]  amitriptyline (ELAVIL) 25 MG tablet TAKE 1 TABLET BY MOUTH AT BEDTIME AS NEEDED FOR SLEEP 11/07/21   Carlynn Purl, Danna Hefty, MD  buPROPion (WELLBUTRIN XL) 150 MG 24 hr tablet Take 1 tablet (150 mg total) by mouth every morning. 12/13/21   Alba Cory, MD  busPIRone (BUSPAR) 10 MG tablet Take 1 tablet (10 mg total) by mouth 2 (two) times daily.  12/13/21   Alba Cory, MD  cetirizine (ZYRTEC) 10 MG tablet Take 10 mg by mouth in the morning.  12/05/17   [provider]  Cholecalciferol (VITAMIN D) 125 MCG (5000 UT) CAPS Take 5,000 Units by mouth daily.    [provider]  docusate sodium (COLACE) 100 MG capsule Take 1 capsule (100 mg total) by mouth 2 (two) times daily as needed for up to 10 days for mild constipation. 04/15/22 04/25/22  Tonna Boehringer, Isami, DO  fluticasone (FLONASE) 50 MCG/ACT nasal spray Place 2 sprays into both nostrils daily. 02/25/22   Margarita Mail, DO  furosemide (LASIX) 20 MG tablet Take 1 tablet (20 mg total) by mouth as needed. For lower extremity swelling. Patient taking differently:  Take 20 mg by mouth daily as needed for edema. For lower extremity swelling. 01/25/22 04/25/22  Alba Cory, MD  GLUCOSAMINE-CHONDROITIN PO Take 2 tablets by mouth in the morning and at bedtime.    [provider]  lidocaine (XYLOCAINE) 5 % ointment Apply 1 application. topically 3 (three) times daily as needed. Apply pea size amount to area as needed 04/15/22   Tonna Boehringer, Isami, DO  meloxicam (MOBIC) 15 MG tablet Take 15 mg by mouth daily as needed for pain. 04/18/19   [provider]  methocarbamol (ROBAXIN) 500 MG tablet Take 500 mg by mouth every 8 (eight) hours as needed for muscle spasms.    [provider]  metoprolol tartrate (LOPRESSOR) 50 MG tablet Take 0.5 tablets (25 mg total) by mouth 2 (two) times daily. 04/07/22 09/29/23  Debbe Odea, MD  Multiple Vitamin (MULTIVITAMIN) tablet Take 1 tablet by mouth daily.    [provider]  omega-3 acid ethyl esters (LOVAZA) 1 g capsule Take 2 capsules (2 g total) by mouth 2 (two) times daily. 12/13/21   Alba Cory, MD  oxyCODONE-acetaminophen (PERCOCET) 5-325 MG tablet Take 1 tablet by mouth every 8 (eight) hours as needed for severe pain. 04/15/22 04/15/23  Tonna Boehringer, Isami, DO  pantoprazole (PROTONIX) 20 MG tablet TAKE 1 TABLET(20 MG) BY MOUTH DAILY 04/18/22   Alba Cory, MD  rivaroxaban (XARELTO) 20 MG TABS tablet Take 1 tablet (20 mg total) by mouth daily with supper. 12/13/21   Debbe Odea, MD  rosuvastatin (CRESTOR) 20 MG tablet Take 1 tablet (20 mg total) by mouth daily. Patient taking differently: Take 20 mg by mouth every evening. 03/11/22   Debbe Odea, MD  Saccharomyces boulardii (PROBIOTIC) 250 MG CAPS Take 1 capsule by mouth daily. 02/16/18   Alba Cory, MD  tamsulosin (FLOMAX) 0.4 MG CAPS capsule Take 1 capsule (0.4 mg total) by mouth daily. 12/13/21   Alba Cory, MD  valsartan (DIOVAN) 80 MG tablet Take 1 tablet (80 mg total) by mouth daily. 03/11/22   Debbe Odea, MD  VISINE  DRY EYE RELIEF 1 % SOLN Place 1 drop into both eyes daily as needed (dry eyes). 06/10/20   [provider]    Allergies: Allergies  Allergen Reactions   Penicillins Itching   Propofol Other (See Comments)    Skin burning and involuntary muscle twitching    Social History:  reports that he quit smoking about 48 years ago. His smoking use included pipe. He started smoking about 49 years ago. He has never used smokeless tobacco. He reports that he does not drink alcohol and does not use drugs.   Family History: Family History  Problem Relation Age of Onset   Asthma Mother    Heart disease Mother  Heart attack Mother    Dementia Mother    Asthma Father    Heart disease Father    Stroke Father     Review of Systems: Review of Systems  Constitutional:  Negative for chills and fever.  HENT:  Negative for hearing loss.   Respiratory:  Negative for shortness of breath.   Cardiovascular:  Negative for chest pain.  Gastrointestinal:  Positive for blood in stool. Negative for abdominal pain, nausea and vomiting.  Genitourinary:  Negative for dysuria.  Musculoskeletal:  Negative for myalgias.  Skin:  Negative for rash.  Neurological:  Negative for dizziness.  Psychiatric/Behavioral:  Negative for depression.    Physical Exam BP 133/90 (BP Location: Right Arm)   Pulse 67   Temp 98.5 F (36.9 C) (Oral)   Resp 17   Ht  (1.778 m)   Wt 129 kg   SpO2 100%   BMI 40.81 kg/m  CONSTITUTIONAL: No acute distress, but appears anxious. HEENT:  Normocephalic, atraumatic, extraocular motion intact. NECK: Trachea is midline, and there is no jugular venous distension.  RESPIRATORY:  Normal respiratory effort without pathologic use of accessory muscles. CARDIOVASCULAR: Regular rhythm and rate GI: The abdomen is soft, non-distended, non-tender.  RECTAL:  external exam does not reveal an obvious area of bleeding, but his sheets have a significant amount of blood in them with mix  of clots. MUSCULOSKELETAL:  Normal muscle strength and tone in all four extremities.  No peripheral edema or cyanosis. SKIN: Skin turgor is normal. There are no pathologic skin lesions.  NEUROLOGIC:  Motor and sensation is grossly normal.  Cranial nerves are grossly intact. PSYCH:  Alert and oriented to person, place and time. Affect is normal.  Laboratory Analysis: Results for orders placed or performed during the hospital encounter of 04/23/22 (from the past 24 hour(s))  Lactic acid, plasma     Status: Abnormal   Collection Time: 04/23/22 12:21 PM  Result Value Ref Range   Lactic Acid, Venous 2.0 (HH) 0.5 - 1.9 mmol/L  Type and screen [facility]     Status: None (Preliminary result)   Collection Time: 04/23/22 12:25 PM  Result Value Ref Range   ABO/RH(D) PENDING    Antibody Screen PENDING    Sample Expiration      04/26/2022,2359 Performed at [facility], [address] [place], [place] [address]   CBC with Differential     Status: Abnormal   Collection Time: 04/23/22 12:32 PM  Result Value Ref Range   WBC 11.0 (H) 4.0 - 10.5 K/uL   RBC 4.84 4.22 - 5.81 MIL/uL   Hemoglobin 14.7 13.0 - 17.0 g/dL   HCT 52.8 41.3 - 24.4 %   MCV 91.1 80.0 - 100.0 fL   MCH 30.4 26.0 - 34.0 pg   MCHC 33.3 30.0 - 36.0 g/dL   RDW 01.0 27.2 - 53.6 %   Platelets 276 150 - 400 K/uL   nRBC 0.0 0.0 - 0.2 %   Neutrophils Relative % 70 %   Neutro Abs 7.7 1.7 - 7.7 K/uL   Lymphocytes Relative 19 %   Lymphs Abs 2.1 0.7 - 4.0 K/uL   Monocytes Relative 9 %   Monocytes Absolute 1.0 0.1 - 1.0 K/uL   Eosinophils Relative 1 %   Eosinophils Absolute 0.1 0.0 - 0.5 K/uL   Basophils Relative 0 %   Basophils Absolute 0.0 0.0 - 0.1 K/uL   Immature Granulocytes 1 %   Abs Immature Granulocytes 0.07  0.00 - 0.07 K/uL  Troponin I (High Sensitivity)     Status: None   Collection Time: 04/23/22 12:32 PM  Result Value Ref Range   Troponin I (High Sensitivity) 10 <18 ng/L  Type and  screen Ordered by PROVIDER DEFAULT     Status: None   Collection Time: 04/23/22 12:59 PM  Result Value Ref Range   ABO/RH(D) O POS    Antibody Screen NEG    Sample Expiration      04/26/2022,2359 Performed at [facility], [address] [place], [place] [address]     [facility] [place]  Result Date: 04/23/2022 CLINICAL DATA:  Shortness of breath for 1 day. EXAM: PORTABLE CHEST 1 VIEW COMPARISON:  07/14/2020 and prior radiographs FINDINGS: The cardiomediastinal silhouette is unremarkable. There is no evidence of focal airspace disease, pulmonary edema, suspicious pulmonary nodule/mass, pleural effusion, or pneumothorax. No acute bony abnormalities are identified. IMPRESSION: No active disease. Electronically Signed   By: Harmon PierJeffrey  Hu M.D.   On: 04/23/2022 13:29    Assessment and Plan: This is a 71 y.o. male s/p hemorrhoidectomy on 04/15/22, now with bright red blood per rectum.  --Discussed with patient that most likely he's bleeding from raw area from the surgery, and now with the Xarelto on board, it's become more profuse.  He is getting started on Kcentra to reverse the effects of the Xarelto and will be taking him today to the OR for exam under anesthesia and possible ligation vs cauterization of areas of bleeding.  Discussed with him the surgery at length and he's willing to proceed.  Will also be admitting him at least for overnight observation to make sure he's not having further bleeding and his Hgb remains stable.  Patient is in agreement with this plan.  All of his questions have been answered.  I spent 75 minutes dedicated to the care of this patient on the date of this encounter to include pre-visit review of records, face-to-face time with the patient discussing diagnosis and management, and any post-visit coordination of care.   Howie IllJose Luis Jenniferlynn Saad, MD [facility] Pg:  [phone]

## 2022-04-23 NOTE — Progress Notes (Signed)
Chaplain responded to RR code to offer compassionate presence. Please call Chaplain if needed.

## 2022-04-23 NOTE — Significant Event (Signed)
Responded to rapid response to room 201, started to feel faint and dizzy while packing was being removed, he then lost consciousness and very briefly lost a pulse, upon entering room observed patient laying speaking with nurses. Arranged to bring patient over to rm 19

## 2022-04-24 ENCOUNTER — Encounter: Payer: Self-pay | Admitting: Surgery

## 2022-04-24 ENCOUNTER — Observation Stay (HOSPITAL_COMMUNITY)
Admit: 2022-04-24 | Discharge: 2022-04-24 | Disposition: A | Discharge status: Home / Self Care | Payer: Medicare Other | Attending: Pulmonary Disease | Admitting: Pulmonary Disease

## 2022-04-24 DIAGNOSIS — Z8249 Family history of ischemic heart disease and other diseases of the circulatory system: Secondary | ICD-10-CM | POA: Diagnosis not present

## 2022-04-24 DIAGNOSIS — Z87891 Personal history of nicotine dependence: Secondary | ICD-10-CM | POA: Diagnosis not present

## 2022-04-24 DIAGNOSIS — I10 Essential (primary) hypertension: Secondary | ICD-10-CM | POA: Diagnosis present

## 2022-04-24 DIAGNOSIS — I469 Cardiac arrest, cause unspecified: Secondary | ICD-10-CM | POA: Diagnosis not present

## 2022-04-24 DIAGNOSIS — Z7901 Long term (current) use of anticoagulants: Secondary | ICD-10-CM | POA: Diagnosis not present

## 2022-04-24 DIAGNOSIS — E872 Acidosis, unspecified: Secondary | ICD-10-CM | POA: Diagnosis present

## 2022-04-24 DIAGNOSIS — Z01810 Encounter for preprocedural cardiovascular examination: Secondary | ICD-10-CM | POA: Diagnosis not present

## 2022-04-24 DIAGNOSIS — I4819 Other persistent atrial fibrillation: Secondary | ICD-10-CM | POA: Diagnosis present

## 2022-04-24 DIAGNOSIS — T45515A Adverse effect of anticoagulants, initial encounter: Secondary | ICD-10-CM | POA: Diagnosis present

## 2022-04-24 DIAGNOSIS — Z823 Family history of stroke: Secondary | ICD-10-CM | POA: Diagnosis not present

## 2022-04-24 DIAGNOSIS — G4733 Obstructive sleep apnea (adult) (pediatric): Secondary | ICD-10-CM | POA: Diagnosis present

## 2022-04-24 DIAGNOSIS — Y838 Other surgical procedures as the cause of abnormal reaction of the patient, or of later complication, without mention of misadventure at the time of the procedure: Secondary | ICD-10-CM | POA: Diagnosis present

## 2022-04-24 DIAGNOSIS — R55 Syncope and collapse: Secondary | ICD-10-CM

## 2022-04-24 DIAGNOSIS — Z8616 Personal history of COVID-19: Secondary | ICD-10-CM | POA: Diagnosis not present

## 2022-04-24 DIAGNOSIS — N99820 Postprocedural hemorrhage and hematoma of a genitourinary system organ or structure following a genitourinary system procedure: Secondary | ICD-10-CM | POA: Diagnosis present

## 2022-04-24 DIAGNOSIS — D6832 Hemorrhagic disorder due to extrinsic circulating anticoagulants: Secondary | ICD-10-CM | POA: Diagnosis present

## 2022-04-24 DIAGNOSIS — I480 Paroxysmal atrial fibrillation: Secondary | ICD-10-CM

## 2022-04-24 DIAGNOSIS — Z818 Family history of other mental and behavioral disorders: Secondary | ICD-10-CM | POA: Diagnosis not present

## 2022-04-24 DIAGNOSIS — Z79899 Other long term (current) drug therapy: Secondary | ICD-10-CM | POA: Diagnosis not present

## 2022-04-24 DIAGNOSIS — Z825 Family history of asthma and other chronic lower respiratory diseases: Secondary | ICD-10-CM | POA: Diagnosis not present

## 2022-04-24 DIAGNOSIS — D72829 Elevated white blood cell count, unspecified: Secondary | ICD-10-CM | POA: Diagnosis present

## 2022-04-24 DIAGNOSIS — D62 Acute posthemorrhagic anemia: Secondary | ICD-10-CM | POA: Diagnosis present

## 2022-04-24 DIAGNOSIS — K648 Other hemorrhoids: Secondary | ICD-10-CM | POA: Diagnosis present

## 2022-04-24 DIAGNOSIS — Z8673 Personal history of transient ischemic attack (TIA), and cerebral infarction without residual deficits: Secondary | ICD-10-CM | POA: Diagnosis not present

## 2022-04-24 DIAGNOSIS — E785 Hyperlipidemia, unspecified: Secondary | ICD-10-CM | POA: Diagnosis present

## 2022-04-24 DIAGNOSIS — I97121 Postprocedural cardiac arrest following other surgery: Secondary | ICD-10-CM | POA: Diagnosis not present

## 2022-04-24 DIAGNOSIS — Z6841 Body Mass Index (BMI) 40.0 and over, adult: Secondary | ICD-10-CM | POA: Diagnosis not present

## 2022-04-24 DIAGNOSIS — K625 Hemorrhage of anus and rectum: Secondary | ICD-10-CM | POA: Diagnosis present

## 2022-04-24 LAB — ECHOCARDIOGRAM COMPLETE
AR max vel: 2.16 cm2
AV Peak grad: 7.6 mmHg
Ao pk vel: 1.38 m/s
Area-P 1/2: 3.83 cm2
Calc EF: 66.3 %
Height: 70 in
S' Lateral: 3.3 cm
Single Plane A2C EF: 63.5 %
Single Plane A4C EF: 68.1 %
Weight: 4550.29 oz

## 2022-04-24 LAB — CBC
HCT: 35.7 % — ABNORMAL LOW (ref 39.0–52.0)
Hemoglobin: 11.9 g/dL — ABNORMAL LOW (ref 13.0–17.0)
MCH: 30.5 pg (ref 26.0–34.0)
MCHC: 33.3 g/dL (ref 30.0–36.0)
MCV: 91.5 fL (ref 80.0–100.0)
Platelets: 270 10*3/uL (ref 150–400)
RBC: 3.9 MIL/uL — ABNORMAL LOW (ref 4.22–5.81)
RDW: 13.3 % (ref 11.5–15.5)
WBC: 10.2 10*3/uL (ref 4.0–10.5)
nRBC: 0 % (ref 0.0–0.2)

## 2022-04-24 LAB — GLUCOSE, CAPILLARY
Glucose-Capillary: 149 mg/dL — ABNORMAL HIGH (ref 70–99)
Glucose-Capillary: 109 mg/dL — ABNORMAL HIGH (ref 70–99)
Glucose-Capillary: 110 mg/dL — ABNORMAL HIGH (ref 70–99)
Glucose-Capillary: 120 mg/dL — ABNORMAL HIGH (ref 70–99)

## 2022-04-24 LAB — BASIC METABOLIC PANEL
Anion gap: 7 (ref 5–15)
BUN: 18 mg/dL (ref 8–23)
CO2: 28 mmol/L (ref 22–32)
Calcium: 9.1 mg/dL (ref 8.9–10.3)
Chloride: 106 mmol/L (ref 98–111)
Creatinine, Ser: 1.1 mg/dL (ref 0.61–1.24)
GFR, Estimated: >60 mL/min (ref >60)
Glucose, Bld: 139 mg/dL — ABNORMAL HIGH (ref 70–99)
Potassium: 4.4 mmol/L (ref 3.5–5.1)
Sodium: 141 mmol/L (ref 135–145)

## 2022-04-24 LAB — PHOSPHORUS: Phosphorus: 4.2 mg/dL (ref 2.5–4.6)

## 2022-04-24 LAB — HIV ANTIBODY (ROUTINE TESTING W REFLEX): HIV Screen 4th Generation wRfx: NONREACTIVE

## 2022-04-24 LAB — MAGNESIUM: Magnesium: 2.1 mg/dL (ref 1.7–2.4)

## 2022-04-24 MED ORDER — PERFLUTREN LIPID MICROSPHERE
1.0000 mL | INTRAVENOUS | Status: AC | PRN
  Administered 2022-04-24: 4 mL via INTRAVENOUS

## 2022-04-24 MED ORDER — CHLORHEXIDINE GLUCONATE CLOTH 2 % EX PADS
6.0000 | MEDICATED_PAD | Freq: Every day | CUTANEOUS | Status: DC
  Administered 2022-04-24 – 2022-04-25 (×2): 6 via TOPICAL

## 2022-04-24 NOTE — Progress Notes (Signed)
*  PRELIMINARY RESULTS* Echocardiogram 2D Echocardiogram has been performed. Definity IV ultrasound imaging agent used on this study.  Tom Underwood 04/24/2022, 9:46 AM

## 2022-04-24 NOTE — Care Plan (Signed)
Patient asymptomatic this morning.  Feels that he is back to baseline.  Discussed with Dr. Aleen Campi.  From the PCCM standpoint he may go to general surgical ward.  2D echo and cardiology evaluation pending.  However suspect that patient's syncope was due to vasovagal episode.  PCCM will sign off please reconsult as needed.  Gailen Shelter, MD Advanced Bronchoscopy PCCM [facility]

## 2022-04-24 NOTE — Progress Notes (Signed)
04/24/2022  Subjective: Patient is 1 Day Post-Op status post exam under anesthesia with suture ligation of the right anterior internal hemorrhoid pedicle which was actively bleeding.  Yesterday postoperatively, once he was on the floor, he stood up to void and then felt dizzy and had a syncopal episode were he was noted to be bradycardic all the way down to loss of pulses.  He had a rapid response and subsequently CODE BLUE during which she received a few chest compressions with quick return of pulses.  He never required any ACLS medications or shocks.  His immediate laboratory work-up showed a stable hemoglobin of 12.4 compared to 12.6 immediately after surgery.  Troponin was stable at 13 compared to 12 after surgery.  EKG shows sinus rhythm with no ST changes.  As a precaution, he was transferred to the intensive care unit for further management and evaluation.  Overnight, he has done well with no issues with his vital signs are heart rate.  He has not had any further bleeding.  His labs are stable today.  He just had an echocardiogram this morning.  Vital signs: Temp:  [97.1 F (36.2 C)-99.1 F (37.3 C)] 97.9 F (36.6 C) (05/21 0735) Pulse Rate:  [56-93] 79 (05/21 1000) Resp:  [14-30] 19 (05/21 0900) BP: (84-133)/(50-90) 115/61 (05/21 1002) SpO2:  [94 %-100 %] 96 % (05/21 1000) Weight:  [026 kg] 129 kg (05/20 1218)   Intake/Output: 05/20 0701 - 05/21 0700 In: 2300.5 [P.O.:780; I.V.:1520.5] Out: 750 [Urine:500; Blood:250] Last BM Date : 04/24/22  Physical Exam: Constitutional: No acute distress Abdomen: Soft, obese, nondistended, nontender.  Digital rectal exam deferred today but no blood noted on his dressing.  Labs:  Recent Labs    04/23/22 1955 04/24/22 0517  WBC 12.6* 10.2  HGB 12.4* 11.9*  HCT 35.7* 35.7*  PLT 264 270   Recent Labs    04/23/22 1955 04/24/22 0517  NA 137 141  K 4.1 4.4  CL 107 106  CO2 23 28  GLUCOSE 168* 139*  BUN 19 18  CREATININE 0.94 1.10   CALCIUM 8.6* 9.1   Recent Labs    04/23/22 1636  LABPROT 18.0*  INR 1.5*    Imaging: DG Chest Port 1 View  Result Date: 04/23/2022 CLINICAL DATA:  Shortness of breath for 1 day. EXAM: PORTABLE CHEST 1 VIEW COMPARISON:  07/14/2020 and prior radiographs FINDINGS: The cardiomediastinal silhouette is unremarkable. There is no evidence of focal airspace disease, pulmonary edema, suspicious pulmonary nodule/mass, pleural effusion, or pneumothorax. No acute bony abnormalities are identified. IMPRESSION: No active disease. Electronically Signed   By: Harmon Pier M.D.   On: 04/23/2022 13:29    Assessment/Plan: This is a 71 y.o. male s/p exam under anesthesia with suture ligation of bleeding right anterior internal hemorrhoid pedicle, complicated by brief loss of pulses with quick return yesterday evening.  - From the medical standpoint, he has done well in the intensive care unit without any further episodes and with very stable vital signs.  His hemoglobin is stable today and he is not having any further bleeding. - As a precaution, echocardiogram was done this morning.  Currently he is pending cardiology evaluation.  Would be great to get their input to see if any further work-up is needed or the patient will be able to be discharged home.  He is on Xarelto at home and also would be prudent to potentially hold his medication for longer but this will also depend on what their thoughts are  given his atrial fibrillation and history of stroke. - For now we will transfer him back to medical surgical unit. - His diet can be advanced as tolerated.   Howie Ill, MD [facility]

## 2022-04-24 NOTE — Consult Note (Signed)
Cardiology Consultation:   Patient ID: Tom Underwood MRN: [id number]; DOB: 1951/03/20  Admit date: 04/23/2022 Date of Consult: 04/24/2022  PCP:  Steele Sizer, MD   [facility] Providers Cardiologist:  Kate Sable, MD  Electrophysiologist:  Vickie Epley, MD       Patient Profile:   Tom Underwood is a 71 y.o. male with a hx of atrial fibrillation, hypertension, GI bleed who is being seen 04/24/2022 for the evaluation of syncope at the request of Dr. Hampton Abbot.  History of Present Illness:   Tom Underwood is a 71 year old male with history of persistent A-fib s/p DC cardioversion 12/2021, hypertension, hyperlipidemia, internal hemorrhoids presenting to the hospital due to GI bleeding.  Patient has a history of internal hemorrhoids, underwent surgery/resection of hemorrhoids 9 days ago.  He was subsequently discharged after procedure to home.  Eliquis was held for 48 hours prior to procedure, restarted Eliquis 4 days after procedure.  He states having dark and red bowel movements since procedure and was told this will be normal.  Yesterday while at home, he had bright red blood per rectum, contacted surgery office and was advised to come to the emergency room.  Was brought to the ED per EMS.  Was taken to the OR yesterday where ligation of internal hemorrhoids was performed.  While in his room.  At the hospital, he was helped up to urinate.  He sat back on his bed and passed out for few seconds.  Per staff reports, patient was bradycardic and lost his pulse.  Compressions were performed, no defibrillation or epinephrine was given.  Patient regained consciousness, regained his pulse.  Has been doing okay since.  Telemetry obtained showed sinus rhythm.  Echocardiogram performed today was normal.   Past Medical History:  Diagnosis Date   Anxiety    Arthritis    Atrial fibrillation ([place])    a.) CHA2DS2-VASc = 4 (age, HTN, CVA x2). b.) s/p 200 J synchronized cardioversion  (DCCV) on 12/08/2021. c.) rate/rythum maintained on oral metoprolol tartrate; chronically anticoagulated using rivaroxaban.   BPH (benign prostatic hyperplasia)    Complication of anesthesia    a.)  Intolerant of propofol; causes "skin burning and involuntary muscle twitching"   DDD (degenerative disc disease), lumbar    Decreased libido    Depression    Diverticulitis    Diverticulosis    GERD (gastroesophageal reflux disease)    Hepatic steatosis    History of 2019 novel coronavirus disease (COVID-19) 12/28/2020   History of cardiac murmur in childhood    History of kidney stones    HLD (hyperlipidemia)    HLD (hyperlipidemia)    Hypertension    Hypogonadism in male    Long term current use of anticoagulant    a.) rivaroxaban   OSA on CPAP    Pre-diabetes    Rhinitis, allergic    Stroke ([place]) 07/23/2019   a.) LEFT posterior cor radiata. b.) residual RIGHT sided weakness    Past Surgical History:  Procedure Laterality Date   CARDIOVERSION N/A 12/08/2021   Procedure: CARDIOVERSION;  Surgeon: Nelva Bush, MD;  Location: [facility] ORS;  Service: Cardiovascular;  Laterality: N/A;   COLONOSCOPY     EVALUATION UNDER ANESTHESIA WITH HEMORRHOIDECTOMY N/A 04/15/2022   Procedure: EXAM UNDER ANESTHESIA WITH HEMORRHOIDECTOMY;  Surgeon: Benjamine Sprague, DO;  Location: [facility] ORS;  Service: General;  Laterality: N/A;   EXTERNAL EAR SURGERY     EYE SURGERY     FINGER SURGERY Right    lateration  to 5 th digit   KNEE SURGERY     left eye     RECTAL EXAM UNDER ANESTHESIA N/A 04/23/2022   Procedure: RECTAL EXAM UNDER ANESTHESIA WITH LIGATION OF BLEEDING;  Surgeon: Olean Ree, MD;  Location: [facility] ORS;  Service: General;  Laterality: N/A;     Home Medications:  Prior to Admission medications   Medication Sig Start Date End Date Taking? Authorizing Provider  acetaminophen (TYLENOL) 325 MG tablet Take 2 tablets (650 mg total) by mouth every 8 (eight) hours as needed for mild pain. 04/15/22  05/15/22 Yes Sakai, Isami, DO  amitriptyline (ELAVIL) 25 MG tablet TAKE 1 TABLET BY MOUTH AT BEDTIME AS NEEDED FOR SLEEP Patient taking differently: Take 25 mg by mouth at bedtime as needed for sleep. 11/07/21  Yes Sowles, Drue Stager, MD  buPROPion (WELLBUTRIN XL) 150 MG 24 hr tablet Take 1 tablet (150 mg total) by mouth every morning. 12/13/21  Yes Sowles, Drue Stager, MD  busPIRone (BUSPAR) 10 MG tablet Take 1 tablet (10 mg total) by mouth 2 (two) times daily. 12/13/21  Yes Sowles, Drue Stager, MD  cetirizine (ZYRTEC) 10 MG tablet Take 10 mg by mouth in the morning.  12/05/17  Yes [provider]  Cholecalciferol (VITAMIN D) 125 MCG (5000 UT) CAPS Take 5,000 Units by mouth daily.   Yes [provider]  fluticasone (FLONASE) 50 MCG/ACT nasal spray Place 2 sprays into both nostrils daily. 02/25/22  Yes Teodora Medici, DO  furosemide (LASIX) 20 MG tablet Take 1 tablet (20 mg total) by mouth as needed. For lower extremity swelling. Patient taking differently: Take 20 mg by mouth daily as needed for edema. For lower extremity swelling. 01/25/22 04/25/22 Yes Sowles, Drue Stager, MD  GLUCOSAMINE-CHONDROITIN PO Take 2 tablets by mouth in the morning and at bedtime.   Yes [provider]  metoprolol tartrate (LOPRESSOR) 50 MG tablet Take 0.5 tablets (25 mg total) by mouth 2 (two) times daily. 04/07/22 09/29/23 Yes Agbor-Etang, Aaron Edelman, MD  Multiple Vitamin (MULTIVITAMIN) tablet Take 1 tablet by mouth daily.   Yes [provider]  omega-3 acid ethyl esters (LOVAZA) 1 g capsule Take 2 capsules (2 g total) by mouth 2 (two) times daily. 12/13/21  Yes Sowles, Drue Stager, MD  pantoprazole (PROTONIX) 20 MG tablet TAKE 1 TABLET(20 MG) BY MOUTH DAILY Patient taking differently: Take 20 mg by mouth daily. 04/18/22  Yes Sowles, Drue Stager, MD  rivaroxaban (XARELTO) 20 MG TABS tablet Take 1 tablet (20 mg total) by mouth daily with supper. 12/13/21  Yes Agbor-Etang, Aaron Edelman, MD  rosuvastatin (CRESTOR) 20 MG tablet Take 1  tablet (20 mg total) by mouth daily. Patient taking differently: Take 20 mg by mouth every evening. 03/11/22  Yes Agbor-Etang, Aaron Edelman, MD  Saccharomyces boulardii (PROBIOTIC) 250 MG CAPS Take 1 capsule by mouth daily. 02/16/18  Yes Sowles, Drue Stager, MD  tamsulosin (FLOMAX) 0.4 MG CAPS capsule Take 1 capsule (0.4 mg total) by mouth daily. 12/13/21  Yes Sowles, Drue Stager, MD  valsartan (DIOVAN) 80 MG tablet Take 1 tablet (80 mg total) by mouth daily. 03/11/22  Yes Kate Sable, MD  acetaminophen (TYLENOL) 650 MG CR tablet Take 1,300 mg by mouth every evening.    [provider]  docusate sodium (COLACE) 100 MG capsule Take 1 capsule (100 mg total) by mouth 2 (two) times daily as needed for up to 10 days for mild constipation. 04/15/22 04/25/22  Lysle Pearl, Isami, DO  lidocaine (XYLOCAINE) 5 % ointment Apply 1 application. topically 3 (three) times daily as needed. Apply  pea size amount to area as needed 04/15/22   Lysle Pearl, Isami, DO  meloxicam (MOBIC) 15 MG tablet Take 15 mg by mouth daily as needed for pain. 04/18/19   [provider]  methocarbamol (ROBAXIN) 500 MG tablet Take 500 mg by mouth every 8 (eight) hours as needed for muscle spasms.    [provider]  oxyCODONE-acetaminophen (PERCOCET) 5-325 MG tablet Take 1 tablet by mouth every 8 (eight) hours as needed for severe pain. Patient not taking: Reported on 04/23/2022 04/15/22 04/15/23  Lysle Pearl, Isami, DO  VISINE DRY EYE RELIEF 1 % SOLN Place 1 drop into both eyes daily as needed (dry eyes). 06/10/20   [provider]    Inpatient Medications: Scheduled Meds:  acetaminophen  1,000 mg Oral Q6H   buPROPion  150 mg Oral q morning   busPIRone  10 mg Oral BID   Chlorhexidine Gluconate Cloth  6 each Topical Daily   fluticasone  2 spray Each Nare Daily   loratadine  10 mg Oral Daily   metoprolol tartrate  25 mg Oral BID   pantoprazole (PROTONIX) IV  40 mg Intravenous QHS   rosuvastatin  20 mg Oral QPM   tamsulosin  0.4 mg Oral  Daily   Continuous Infusions:  PRN Meds: amitriptyline, HYDROmorphone (DILAUDID) injection, methocarbamol, ondansetron **OR** ondansetron (ZOFRAN) IV, oxyCODONE, polyethylene glycol  Allergies:    Allergies  Allergen Reactions   Penicillins Itching    Social History:   Social History   Socioeconomic History   Marital status: Married    Spouse name: Butch Penny    Number of children: 2   Years of education: Not on file   Highest education level: Not on file  Occupational History   Occupation: retired   Tobacco Use   Smoking status: Former    Types: Pipe    Start date: 02/20/1973    Quit date: 02/20/1974    Years since quitting: 48.2   Smokeless tobacco: Never  Vaping Use   Vaping Use: Never used  Substance and Sexual Activity   Alcohol use: No    Alcohol/week: 0.0 standard drinks   Drug use: No   Sexual activity: Yes    Partners: Female  Other Topics Concern   Not on file  Social History Narrative   Not on file   Social Determinants of Health   Financial Resource Strain: Low Risk    Difficulty of Paying Living Expenses: Not hard at all  Food Insecurity: Not on file  Transportation Needs: Not on file  Physical Activity: Not on file  Stress: Not on file  Social Connections: Not on file  Intimate Partner Violence: Not on file    Family History:    Family History  Problem Relation Age of Onset   Asthma Mother    Heart disease Mother    Heart attack Mother    Dementia Mother    Asthma Father    Heart disease Father    Stroke Father      ROS:  Please see the history of present illness.   All other ROS reviewed and negative.     Physical Exam/Data:   Vitals:   04/24/22 1000 04/24/22 1002 04/24/22 1115 04/24/22 1559  BP: 115/61 115/61 (!) 107/56 (!) 95/52  Pulse: 79  67 65  Resp:   20 18  Temp:   98 F (36.7 C) 98.6 F (37 C)  TempSrc:      SpO2: 96%  99% 94%  Weight:  Height:        Intake/Output Summary (Last 24 hours) at 04/24/2022  1703 Last data filed at 04/24/2022 1405 Gross per 24 hour  Intake 1740.53 ml  Output 1450 ml  Net 290.53 ml      04/23/2022   12:18 PM 04/15/2022    7:05 AM 03/17/2022   10:52 AM  Last 3 Weights  Weight (lbs) 284 lb 6.3 oz 286 lb 288 lb 12.8 oz  Weight (kg) 129 kg 129.729 kg 130.999 kg     Body mass index is 40.81 kg/m.  General:  Well nourished, well developed, in no acute distress HEENT: normal Neck: no JVD Vascular: No carotid bruits; Distal pulses 2+ bilaterally Cardiac:  normal S1, S2; RRR; no murmur  Lungs:  clear to auscultation bilaterally, no wheezing, rhonchi or rales  Abd: soft, nontender, no hepatomegaly  Ext: no edema Musculoskeletal:  No deformities, BUE and BLE strength normal and equal Skin: warm and dry  Neuro:  CNs 2-12 intact, no focal abnormalities noted Psych:  Normal affect   EKG:  The EKG was personally reviewed and demonstrates: Sinus rhythm Telemetry:  Telemetry was personally reviewed and demonstrates: Sinus rhythm  Relevant CV Studies:  TTE 04/24/2022 1. Left ventricular ejection fraction, by estimation, is 60 to 65%. Left  ventricular ejection fraction by 2D MOD biplane is 66.3 %. The left  ventricle has normal function. The left ventricle has no regional wall  motion abnormalities. There is mild left  ventricular hypertrophy. Left ventricular diastolic parameters are  consistent with Grade I diastolic dysfunction (impaired relaxation).   2. Right ventricular systolic function is normal. The right ventricular  size is not well visualized.   3. The mitral valve is normal in structure. No evidence of mitral valve  regurgitation.   4. The aortic valve was not well visualized. Aortic valve regurgitation  is not visualized.   5. The inferior vena cava is normal in size with greater than 50%  respiratory variability, suggesting right atrial pressure of 3 mmHg.   Laboratory Data:  High Sensitivity Troponin:   Recent Labs  Lab 04/23/22 1232  04/23/22 1636 04/23/22 1955  TROPONINIHS 10 12 13      Chemistry Recent Labs  Lab 04/23/22 1636 04/23/22 1955 04/24/22 0517  NA 140  138 137 141  K 4.0  3.9 4.1 4.4  CL 108  107 107 106  CO2 25  25 23 28   GLUCOSE 121*  130* 168* 139*  BUN 19  18 19 18   CREATININE 1.02  0.94 0.94 1.10  CALCIUM 8.7*  8.6* 8.6* 9.1  MG 2.0 1.9 2.1  GFRNONAA >60  >60 >60 >60  ANIONGAP 7  6 7 7     Recent Labs  Lab 04/23/22 1636 04/23/22 1955  PROT 6.4* 6.6  ALBUMIN 3.5 3.5  AST 21 25  ALT 20 20  ALKPHOS 84 84  BILITOT 1.4* 1.2   Lipids No results for input(s): CHOL, TRIG, HDL, LABVLDL, LDLCALC, CHOLHDL in the last 168 hours.  Hematology Recent Labs  Lab 04/23/22 1636 04/23/22 1955 04/24/22 0517  WBC 10.9* 12.6* 10.2  RBC 4.13* 3.98* 3.90*  HGB 12.6* 12.4* 11.9*  HCT 37.3* 35.7* 35.7*  MCV 90.3 89.7 91.5  MCH 30.5 31.2 30.5  MCHC 33.8 34.7 33.3  RDW 12.9 13.0 13.3  PLT 246 264 270   Thyroid No results for input(s): TSH, FREET4 in the last 168 hours.  BNPNo results for input(s): BNP, PROBNP in the last 168 hours.  DDimer No results for input(s): DDIMER in the last 168 hours.   Radiology/Studies:  DG Chest Port 1 View  Result Date: 04/23/2022 CLINICAL DATA:  Shortness of breath for 1 day. EXAM: PORTABLE CHEST 1 VIEW COMPARISON:  07/14/2020 and prior radiographs FINDINGS: The cardiomediastinal silhouette is unremarkable. There is no evidence of focal airspace disease, pulmonary edema, suspicious pulmonary nodule/mass, pleural effusion, or pneumothorax. No acute bony abnormalities are identified. IMPRESSION: No active disease. Electronically Signed   By: Margarette Canada M.D.   On: 04/23/2022 13:29   ECHOCARDIOGRAM COMPLETE  Result Date: 04/24/2022    ECHOCARDIOGRAM REPORT   Patient Name:   Tom Underwood Date of Exam: 04/24/2022 Medical Rec #:  [id number]         Height:       70.0 in Accession #:    [id number]        Weight:       284.4 lb Date of Birth:  Sep 07, 1951          BSA:          2.424 m Patient Age:    89 years          BP:           115/71 mmHg Patient Gender: M                 HR:           72 bpm. Exam Location:  [facility] Procedure: 2D Echo and Intracardiac Opacification Agent Indications:     Cardiac arrest I46.9  History:         Patient has prior history of Echocardiogram examinations, most                  recent 07/15/2020.  Sonographer:     Kathlen Brunswick RDCS Referring Phys:  [id number] [place] L [place] Diagnosing Phys: Kate Sable MD  Sonographer Comments: Technically difficult study due to poor echo windows and patient is morbidly obese. Image acquisition challenging due to patient body habitus. IMPRESSIONS  1. Left ventricular ejection fraction, by estimation, is 60 to 65%. Left ventricular ejection fraction by 2D MOD biplane is 66.3 %. The left ventricle has normal function. The left ventricle has no regional wall motion abnormalities. There is mild left ventricular hypertrophy. Left ventricular diastolic parameters are consistent with Grade I diastolic dysfunction (impaired relaxation).  2. Right ventricular systolic function is normal. The right ventricular size is not well visualized.  3. The mitral valve is normal in structure. No evidence of mitral valve regurgitation.  4. The aortic valve was not well visualized. Aortic valve regurgitation is not visualized.  5. The inferior vena cava is normal in size with greater than 50% respiratory variability, suggesting right atrial pressure of 3 mmHg. FINDINGS  Left Ventricle: Left ventricular ejection fraction, by estimation, is 60 to 65%. Left ventricular ejection fraction by 2D MOD biplane is 66.3 %. The left ventricle has normal function. The left ventricle has no regional wall motion abnormalities. Definity contrast agent was given IV to delineate the left ventricular endocardial borders. The left ventricular internal cavity size was normal in size. There is mild left ventricular hypertrophy. Left  ventricular diastolic parameters are consistent with Grade I diastolic dysfunction (impaired relaxation). Right Ventricle: The right ventricular size is not well visualized. No increase in right ventricular wall thickness. Right ventricular systolic function is normal. Left Atrium: Left atrial size was normal in size. Right Atrium: Right atrial size was normal in size.  Pericardium: There is no evidence of pericardial effusion. Mitral Valve: The mitral valve is normal in structure. No evidence of mitral valve regurgitation. Tricuspid Valve: The tricuspid valve is not well visualized. Tricuspid valve regurgitation is not demonstrated. Aortic Valve: The aortic valve was not well visualized. Aortic valve regurgitation is not visualized. Aortic valve peak gradient measures 7.6 mmHg. Pulmonic Valve: The pulmonic valve was not well visualized. Pulmonic valve regurgitation is not visualized. Aorta: The aortic root is normal in size and structure. Venous: The inferior vena cava is normal in size with greater than 50% respiratory variability, suggesting right atrial pressure of 3 mmHg. IAS/Shunts: No atrial level shunt detected by color flow Doppler.  LEFT VENTRICLE PLAX 2D                        Biplane EF (MOD) LVIDd:         4.75 cm         LV Biplane EF:   Left LVIDs:         3.30 cm                          ventricular LV PW:         1.15 cm                          ejection LV IVS:        1.15 cm                          fraction by LVOT diam:     1.80 cm                          2D MOD LV SV:         57                               biplane is LV SV Index:   23                               66.3 %. LVOT Area:     2.54 cm                                Diastology                                LV e' medial:    8.38 cm/s LV Volumes (MOD)               LV E/e' medial:  9.6 LV vol d, MOD    72.7 ml       LV e' lateral:   7.18 cm/s A2C:                           LV E/e' lateral: 11.2 LV vol d, MOD    69.7 ml A4C: LV vol s,  MOD    26.5 ml A2C: LV vol s, MOD    22.2 ml A4C: LV SV MOD A2C:   46.2 ml LV SV MOD A4C:  69.7 ml LV SV MOD BP:    48.3 ml RIGHT VENTRICLE RV S prime:     15.60 cm/s TAPSE (M-mode): 1.8 cm LEFT ATRIUM             Index        RIGHT ATRIUM           Index LA diam:        3.70 cm 1.53 cm/m   RA Area:     17.00 cm LA Vol (A2C):   57.0 ml 23.51 ml/m  RA Volume:   43.10 ml  17.78 ml/m LA Vol (A4C):   73.0 ml 30.11 ml/m LA Biplane Vol: 66.7 ml 27.51 ml/m  AORTIC VALVE                 PULMONIC VALVE AV Area (Vmax): 2.16 cm     PV Vmax:       1.07 m/s AV Vmax:        138.00 cm/s  PV Peak grad:  4.6 mmHg AV Peak Grad:   7.6 mmHg LVOT Vmax:      117.00 cm/s LVOT Vmean:     75.500 cm/s LVOT VTI:       0.223 m  AORTA Ao Root diam: 2.60 cm MITRAL VALVE MV Area (PHT): 3.83 cm     SHUNTS MV Decel Time: 198 msec     Systemic VTI:  0.22 m MV E velocity: 80.50 cm/s   Systemic Diam: 1.80 cm MV A velocity: 108.00 cm/s MV E/A ratio:  0.75 Kate Sable MD Electronically signed by Kate Sable MD Signature Date/Time: 04/24/2022/1:38:10 PM    Final      Assessment and Plan:   Syncope -Etiology appears vasovagal in the context of micturition postoperatively. -Telemetry with no arrhythmias, maintaining sinus rhythm -Echocardiogram with normal EF. -BP low normal, hold ARB for now. -Monitor on telemetry while in-house.  If no arrhythmias noted, okay for discharge tomorrow from a cardiac perspective.  2.  Paroxysmal atrial fibrillation -Maintaining sinus rhythm -Continue Lopressor 25 mg twice daily -Resume Xarelto when okay by surgery team likely as outpatient.  3.  Hypertension -BP low normal -Hold ARB for now, okay to resume when BP permits -Continue Lopressor  4.  GI bleed, hemorrhoidal bleeding -S/p hemorrhoidectomy, ligation -Management as per surgical team  Total encounter time more than 85 minutes  Greater than 50% was spent in counseling and coordination of care with the  patient   Signed, Kate Sable, MD  04/24/2022 5:03 PM

## 2022-04-25 DIAGNOSIS — I480 Paroxysmal atrial fibrillation: Secondary | ICD-10-CM | POA: Diagnosis not present

## 2022-04-25 DIAGNOSIS — R55 Syncope and collapse: Secondary | ICD-10-CM | POA: Diagnosis not present

## 2022-04-25 LAB — GLUCOSE, CAPILLARY
Glucose-Capillary: 178 mg/dL — ABNORMAL HIGH (ref 70–99)
Glucose-Capillary: 105 mg/dL — ABNORMAL HIGH (ref 70–99)
Glucose-Capillary: 110 mg/dL — ABNORMAL HIGH (ref 70–99)
Glucose-Capillary: 101 mg/dL — ABNORMAL HIGH (ref 70–99)
Glucose-Capillary: 106 mg/dL — ABNORMAL HIGH (ref 70–99)
Glucose-Capillary: 112 mg/dL — ABNORMAL HIGH (ref 70–99)
Glucose-Capillary: 134 mg/dL — ABNORMAL HIGH (ref 70–99)

## 2022-04-25 NOTE — Progress Notes (Signed)
Progress Note  Patient Name: Tom Underwood Date of Encounter: 04/25/2022  [facility] Cardiologist: Kate Sable, MD   Subjective   Patient overall is doing much better. No further bleeding noted. In SR. Hgb stable. BP intermittently soft.   Inpatient Medications    Scheduled Meds:  acetaminophen  1,000 mg Oral Q6H   buPROPion  150 mg Oral q morning   busPIRone  10 mg Oral BID   Chlorhexidine Gluconate Cloth  6 each Topical Daily   fluticasone  2 spray Each Nare Daily   loratadine  10 mg Oral Daily   metoprolol tartrate  25 mg Oral BID   pantoprazole (PROTONIX) IV  40 mg Intravenous QHS   rosuvastatin  20 mg Oral QPM   tamsulosin  0.4 mg Oral Daily   Continuous Infusions:  PRN Meds: amitriptyline, HYDROmorphone (DILAUDID) injection, methocarbamol, ondansetron **OR** ondansetron (ZOFRAN) IV, oxyCODONE, polyethylene glycol   Vital Signs    Vitals:   04/24/22 1115 04/24/22 1559 04/24/22 2021 04/25/22 0342  BP: (!) 107/56 (!) 95/52 (!) 121/57 (!) 115/53  Pulse: 67 65 80 71  Resp: 20 18 18 16   Temp: 98 F (36.7 C) 98.6 F (37 C) 97.7 F (36.5 C) 98.5 F (36.9 C)  TempSrc:      SpO2: 99% 94% 99% 98%  Weight:      Height:        Intake/Output Summary (Last 24 hours) at 04/25/2022 0749 Last data filed at 04/24/2022 2200 Gross per 24 hour  Intake 1620 ml  Output 1400 ml  Net 220 ml      04/23/2022   12:18 PM 04/15/2022    7:05 AM 03/17/2022   10:52 AM  Last 3 Weights  Weight (lbs) 284 lb 6.3 oz 286 lb 288 lb 12.8 oz  Weight (kg) 129 kg 129.729 kg 130.999 kg      Telemetry    NSR, HR 70s - Personally Reviewed  ECG    No new - Personally Reviewed  Physical Exam   GEN: No acute distress.   Neck: No JVD Cardiac: RRR, no murmurs, rubs, or gallops.  Respiratory: Clear to auscultation bilaterally. GI: Soft, nontender, non-distended  MS: No edema; No deformity. Neuro:  Nonfocal  Psych: Normal affect   Labs    High Sensitivity Troponin:    Recent Labs  Lab 04/23/22 1232 04/23/22 1636 04/23/22 1955  TROPONINIHS 10 12 13      Chemistry Recent Labs  Lab 04/23/22 1636 04/23/22 1955 04/24/22 0517  NA 140  138 137 141  K 4.0  3.9 4.1 4.4  CL 108  107 107 106  CO2 25  25 23 28   GLUCOSE 121*  130* 168* 139*  BUN 19  18 19 18   CREATININE 1.02  0.94 0.94 1.10  CALCIUM 8.7*  8.6* 8.6* 9.1  MG 2.0 1.9 2.1  PROT 6.4* 6.6  --   ALBUMIN 3.5 3.5  --   AST 21 25  --   ALT 20 20  --   ALKPHOS 84 84  --   BILITOT 1.4* 1.2  --   GFRNONAA >60  >60 >60 >60  ANIONGAP 7  6 7 7     Lipids No results for input(s): CHOL, TRIG, HDL, LABVLDL, LDLCALC, CHOLHDL in the last 168 hours.  Hematology Recent Labs  Lab 04/23/22 1636 04/23/22 1955 04/24/22 0517  WBC 10.9* 12.6* 10.2  RBC 4.13* 3.98* 3.90*  HGB 12.6* 12.4* 11.9*  HCT 37.3* 35.7* 35.7*  MCV  90.3 89.7 91.5  MCH 30.5 31.2 30.5  MCHC 33.8 34.7 33.3  RDW 12.9 13.0 13.3  PLT 246 264 270   Thyroid No results for input(s): TSH, FREET4 in the last 168 hours.  BNPNo results for input(s): BNP, PROBNP in the last 168 hours.  DDimer No results for input(s): DDIMER in the last 168 hours.   Radiology    DG Chest Port 1 View  Result Date: 04/23/2022 CLINICAL DATA:  Shortness of breath for 1 day. EXAM: PORTABLE CHEST 1 VIEW COMPARISON:  07/14/2020 and prior radiographs FINDINGS: The cardiomediastinal silhouette is unremarkable. There is no evidence of focal airspace disease, pulmonary edema, suspicious pulmonary nodule/mass, pleural effusion, or pneumothorax. No acute bony abnormalities are identified. IMPRESSION: No active disease. Electronically Signed   By: Margarette Canada M.D.   On: 04/23/2022 13:29   ECHOCARDIOGRAM COMPLETE  Result Date: 04/24/2022    ECHOCARDIOGRAM REPORT   Patient Name:   Tom Underwood Date of Exam: 04/24/2022 Medical Rec #:  [id number]         Height:       70.0 in Accession #:    [id number]        Weight:       284.4 lb Date of Birth:  1951/11/30          BSA:          2.424 m Patient Age:    71 years          BP:           115/71 mmHg Patient Gender: M                 HR:           72 bpm. Exam Location:  [facility] Procedure: 2D Echo and Intracardiac Opacification Agent Indications:     Cardiac arrest I46.9  History:         Patient has prior history of Echocardiogram examinations, most                  recent 07/15/2020.  Sonographer:     Kathlen Brunswick RDCS Referring Phys:  [id number] BRIT[place] L [place] Diagnosing Phys: Kate Sable MD  Sonographer Comments: Technically difficult study due to poor echo windows and patient is morbidly obese. Image acquisition challenging due to patient body habitus. IMPRESSIONS  1. Left ventricular ejection fraction, by estimation, is 60 to 65%. Left ventricular ejection fraction by 2D MOD biplane is 66.3 %. The left ventricle has normal function. The left ventricle has no regional wall motion abnormalities. There is mild left ventricular hypertrophy. Left ventricular diastolic parameters are consistent with Grade I diastolic dysfunction (impaired relaxation).  2. Right ventricular systolic function is normal. The right ventricular size is not well visualized.  3. The mitral valve is normal in structure. No evidence of mitral valve regurgitation.  4. The aortic valve was not well visualized. Aortic valve regurgitation is not visualized.  5. The inferior vena cava is normal in size with greater than 50% respiratory variability, suggesting right atrial pressure of 3 mmHg. FINDINGS  Left Ventricle: Left ventricular ejection fraction, by estimation, is 60 to 65%. Left ventricular ejection fraction by 2D MOD biplane is 66.3 %. The left ventricle has normal function. The left ventricle has no regional wall motion abnormalities. Definity contrast agent was given IV to delineate the left ventricular endocardial borders. The left ventricular internal cavity size was normal in size. There is mild left ventricular hypertrophy. Left  ventricular  diastolic parameters are consistent with Grade I diastolic dysfunction (impaired relaxation). Right Ventricle: The right ventricular size is not well visualized. No increase in right ventricular wall thickness. Right ventricular systolic function is normal. Left Atrium: Left atrial size was normal in size. Right Atrium: Right atrial size was normal in size. Pericardium: There is no evidence of pericardial effusion. Mitral Valve: The mitral valve is normal in structure. No evidence of mitral valve regurgitation. Tricuspid Valve: The tricuspid valve is not well visualized. Tricuspid valve regurgitation is not demonstrated. Aortic Valve: The aortic valve was not well visualized. Aortic valve regurgitation is not visualized. Aortic valve peak gradient measures 7.6 mmHg. Pulmonic Valve: The pulmonic valve was not well visualized. Pulmonic valve regurgitation is not visualized. Aorta: The aortic root is normal in size and structure. Venous: The inferior vena cava is normal in size with greater than 50% respiratory variability, suggesting right atrial pressure of 3 mmHg. IAS/Shunts: No atrial level shunt detected by color flow Doppler.  LEFT VENTRICLE PLAX 2D                        Biplane EF (MOD) LVIDd:         4.75 cm         LV Biplane EF:   Left LVIDs:         3.30 cm                          ventricular LV PW:         1.15 cm                          ejection LV IVS:        1.15 cm                          fraction by LVOT diam:     1.80 cm                          2D MOD LV SV:         57                               biplane is LV SV Index:   23                               66.3 %. LVOT Area:     2.54 cm                                Diastology                                LV e' medial:    8.38 cm/s LV Volumes (MOD)               LV E/e' medial:  9.6 LV vol d, MOD    72.7 ml       LV e' lateral:   7.18 cm/s A2C:  LV E/e' lateral: 11.2 LV vol d, MOD    69.7 ml A4C: LV vol s,  MOD    26.5 ml A2C: LV vol s, MOD    22.2 ml A4C: LV SV MOD A2C:   46.2 ml LV SV MOD A4C:   69.7 ml LV SV MOD BP:    48.3 ml RIGHT VENTRICLE RV S prime:     15.60 cm/s TAPSE (M-mode): 1.8 cm LEFT ATRIUM             Index        RIGHT ATRIUM           Index LA diam:        3.70 cm 1.53 cm/m   RA Area:     17.00 cm LA Vol (A2C):   57.0 ml 23.51 ml/m  RA Volume:   43.10 ml  17.78 ml/m LA Vol (A4C):   73.0 ml 30.11 ml/m LA Biplane Vol: 66.7 ml 27.51 ml/m  AORTIC VALVE                 PULMONIC VALVE AV Area (Vmax): 2.16 cm     PV Vmax:       1.07 m/s AV Vmax:        138.00 cm/s  PV Peak grad:  4.6 mmHg AV Peak Grad:   7.6 mmHg LVOT Vmax:      117.00 cm/s LVOT Vmean:     75.500 cm/s LVOT VTI:       0.223 m  AORTA Ao Root diam: 2.60 cm MITRAL VALVE MV Area (PHT): 3.83 cm     SHUNTS MV Decel Time: 198 msec     Systemic VTI:  0.22 m MV E velocity: 80.50 cm/s   Systemic Diam: 1.80 cm MV A velocity: 108.00 cm/s MV E/A ratio:  0.75 Kate Sable MD Electronically signed by Kate Sable MD Signature Date/Time: 04/24/2022/1:38:10 PM    Final     Cardiac Studies   TTE 04/24/2022 1. Left ventricular ejection fraction, by estimation, is 60 to 65%. Left  ventricular ejection fraction by 2D MOD biplane is 66.3 %. The left  ventricle has normal function. The left ventricle has no regional wall  motion abnormalities. There is mild left  ventricular hypertrophy. Left ventricular diastolic parameters are  consistent with Grade I diastolic dysfunction (impaired relaxation).   2. Right ventricular systolic function is normal. The right ventricular  size is not well visualized.   3. The mitral valve is normal in structure. No evidence of mitral valve  regurgitation.   4. The aortic valve was not well visualized. Aortic valve regurgitation  is not visualized.   5. The inferior vena cava is normal in size with greater than 50%  respiratory variability, suggesting right atrial pressure of 3 mmHg.     Patient  Profile     71 y.o. male with a h/o atrial fibrillation, hypertension, GI bleed who is being seen 04/24/2022 for the evaluation of syncope  Assessment & Plan    Syncope - vasovagal vs orthostatic - Hgb stable - check orthostatics - ARB held for borderline pressures. He is on Lopressor 25mg  BID - maintaining NSR - echo with normal LVSF and no significant vavlvular abnormality - can consider heart monitor at discharge  Paroxysmal Afib - in NSR - continue Lopressor 25mg  BID for rate control - resume Xarelto when deemd OK by surgery  HTN - ARB held for borderline pressures - continue Lopressor  GIB/hemorrhoidal bleeding - s/p hemorrhoidectomy  and ligation - per surgery  For questions or updates, please contact [place] Please consult [email or web address] for contact info under        Signed, Martha Soltys Ninfa Meeker, PA-C  04/25/2022, 7:49 AM

## 2022-04-25 NOTE — Evaluation (Signed)
Physical Therapy Evaluation Patient Details Name: SENAN UREY MRN: [id number] DOB: 11-01-1951 Today's Date: 04/25/2022  History of Present Illness  Pt admitted for rectal bleed with complaints of syncope leading to code blue and CPR. HIstory includes Afib, HTN, CVA, and GI bleed.  Clinical Impression  Pt is a pleasant 71 year old male who was admitted for rectal bleed. Pt performs bed mobility/transfers with mod I and ambulation with cga and SPC. Pt demonstrates deficits with balance/endurance. Pt very motivated to return back to baseline. Would benefit from skilled PT to address above deficits and promote optimal return to PLOF. Currently recommending OP PT, pt established in [place] [facility].     Recommendations for follow up therapy are one component of a multi-disciplinary discharge planning process, led by the attending physician.  Recommendations may be updated based on patient status, additional functional criteria and insurance authorization.  Follow Up Recommendations Outpatient PT    Assistance Recommended at Discharge PRN  Patient can return home with the following  A little help with walking and/or transfers;Help with stairs or ramp for entrance    Equipment Recommendations None recommended by PT  Recommendations for Other Services       Functional Status Assessment Patient has had a recent decline in their functional status and demonstrates the ability to make significant improvements in function in a reasonable and predictable amount of time.     Precautions / Restrictions Precautions Precautions: Fall Restrictions Weight Bearing Restrictions: No      Mobility  Bed Mobility Overal bed mobility: Modified Independent             General bed mobility comments: slow technique, however able to sit at EOB with upright posture.    Transfers Overall transfer level: Needs assistance Equipment used: Straight cane Transfers: Sit to/from Stand Sit to Stand: Modified  independent (Device/Increase time)           General transfer comment: safe technique with upright posture. No dizziness noted    Ambulation/Gait Ambulation/Gait assistance: Min guard Gait Distance (Feet): 100 Feet Assistive device: Straight cane Gait Pattern/deviations: Step-through pattern       General Gait Details: ambulated in hallway and in room with reciprocal gait pattern and use of SPC. Safe technique with rating of 5/10 on RPE. HR WNL throughout mobility  Stairs            Wheelchair Mobility    Modified Rankin (Stroke Patients Only)       Balance Overall balance assessment: Needs assistance Sitting-balance support: Feet supported Sitting balance-Leahy Scale: Good     Standing balance support: Single extremity supported Standing balance-Leahy Scale: Fair                               Pertinent Vitals/Pain Pain Assessment Pain Assessment: No/denies pain    Home Living Family/patient expects to be discharged to:: Private residence Living Arrangements: Spouse/significant other Available Help at Discharge: Family;Available 24 hours/day Type of Home: House Home Access: Stairs to enter Entrance Stairs-Rails: Can reach both Entrance Stairs-Number of Steps: 5   Home Layout: One level Home Equipment: Cane - single point      Prior Function Prior Level of Function : Independent/Modified Independent             Mobility Comments: using SPC. Reports no falls ADLs Comments: needs assist for dressing from wife     Hand Dominance  Extremity/Trunk Assessment   Upper Extremity Assessment Upper Extremity Assessment: Overall WFL for tasks assessed    Lower Extremity Assessment Lower Extremity Assessment: Overall WFL for tasks assessed       Communication   Communication: No difficulties  Cognition Arousal/Alertness: Awake/alert Behavior During Therapy: WFL for tasks assessed/performed Overall Cognitive Status: Within  Functional Limits for tasks assessed                                          General Comments      Exercises Other Exercises Other Exercises: ambulated to bathroom. Pt able to get on/off low toilet and perform peri care with supervision. Bloody BM noted. RN notified.   Assessment/Plan    PT Assessment Patient needs continued PT services  PT Problem List Decreased balance;Decreased mobility       PT Treatment Interventions Gait training;Therapeutic exercise;Balance training;Stair training    PT Goals (Current goals can be found in the Care Plan section)  Acute Rehab PT Goals Patient Stated Goal: to return home PT Goal Formulation: With patient Time For Goal Achievement: 05/09/22 Potential to Achieve Goals: Good    Frequency Min 2X/week     Co-evaluation               AM-PAC PT "6 Clicks" Mobility  Outcome Measure Help needed turning from your back to your side while in a flat bed without using bedrails?: None Help needed moving from lying on your back to sitting on the side of a flat bed without using bedrails?: None Help needed moving to and from a bed to a chair (including a wheelchair)?: A Little Help needed standing up from a chair using your arms (e.g., wheelchair or bedside chair)?: A Little Help needed to walk in hospital room?: A Little Help needed climbing 3-5 steps with a railing? : A Little 6 Click Score: 20    End of Session Equipment Utilized During Treatment: Gait belt Activity Tolerance: Patient tolerated treatment well Patient left: in bed (left seated on edge of bed) Nurse Communication: Mobility status PT Visit Diagnosis: Muscle weakness (generalized) (M62.81);Difficulty in walking, not elsewhere classified (R26.2)    Time: 1433-1500 PT Time Calculation (min) (ACUTE ONLY): 27 min   Charges:   PT Evaluation $PT Eval Low Complexity: 1 Low PT Treatments $Therapeutic Activity: 8-22 mins        Elizabeth Palau, PT, DPT,  GCS [phone]   Hazael Olveda 04/25/2022, 4:50 PM

## 2022-04-25 NOTE — Progress Notes (Signed)
Subjective:  CC: Tom Underwood is a 71 y.o. male  Hospital stay day 1, 2 Days Post-Op hemorrhoidectomy  HPI: No issues overnight, but had some bleeding again with most recent BM this pm.   ROS:  General: Denies weight loss, weight gain, fatigue, fevers, chills, and night sweats. Heart: Denies chest pain, palpitations, racing heart, irregular heartbeat, leg pain or swelling, and decreased activity tolerance. Respiratory: Denies breathing difficulty, shortness of breath, wheezing, cough, and sputum. GI: Denies change in appetite, heartburn, nausea, vomiting, constipation, diarrhea GU: Denies difficulty urinating, pain with urinating, urgency, frequency, blood in urine.   Objective:   Temp:  [97.7 F (36.5 C)-98.5 F (36.9 C)] 98.3 F (36.8 C) (05/22 1519) Pulse Rate:  [71-98] 95 (05/22 1519) Resp:  [16-18] 18 (05/22 0847) BP: (115-121)/(53-73) 121/69 (05/22 1519) SpO2:  [97 %-100 %] 97 % (05/22 1519)     Height: 5\' 10"  (177.8 cm) Weight: 129 kg BMI (Calculated): 40.81   Intake/Output this shift:   Intake/Output Summary (Last 24 hours) at 04/25/2022 1635 Last data filed at 04/25/2022 1424 Gross per 24 hour  Intake 1020 ml  Output 450 ml  Net 570 ml    Constitutional :  alert, cooperative, appears stated age, and no distress  Respiratory:  clear to auscultation bilaterally  Cardiovascular:  regular rate and rhythm  Gastrointestinal: soft, non-tender; bowel sounds normal; no masses,  no organomegaly.   Skin: Cool and moist.   Psychiatric: Normal affect, non-agitated, not confused       LABS:     Latest Ref Rng & Units 04/24/2022    5:17 AM 04/23/2022    7:55 PM 04/23/2022    4:36 PM  CMP  Glucose 70 - 99 mg/dL 04/25/2022   627   035     009    BUN 8 - 23 mg/dL 18   19   18     19     Creatinine 0.61 - 1.24 mg/dL 381     8.29     1.02    Sodium 135 - 145 mmol/L 141   137   138     140    Potassium 3.5 - 5.1 mmol/L 4.4   4.1   3.9     4.0    Chloride 98 - 111 mmol/L  106   107   107     108    CO2 22 - 32 mmol/L 28   23   25     25     Calcium 8.9 - 10.3 mg/dL 9.1   8.6   8.6     8.7    Total Protein 6.5 - 8.1 g/dL  6.6   6.4    Total Bilirubin 0.3 - 1.2 mg/dL  1.2   1.4    Alkaline Phos 38 - 126 U/L  84   84    AST 15 - 41 U/L  25   21    ALT 0 - 44 U/L  20   20        Latest Ref Rng & Units 04/24/2022    5:17 AM 04/23/2022    7:55 PM 04/23/2022    4:36 PM  CBC  WBC 4.0 - 10.5 K/uL 10.2   12.6   10.9    Hemoglobin 13.0 - 17.0 g/dL 04/26/2022   04/25/2022   04/25/2022    Hematocrit 39.0 - 52.0 % 35.7   35.7   37.3    Platelets 150 - 400  K/uL 270   264   246      RADS: N/a Assessment:   S/p hemorrhoidectomy x 2 for bleeding secondary to eliquis use.  Still having some bleeding episodes, but minor compared previous ones.  Will continue to monitor one more day in hospital to make sure no other major bleeding issues prior to d/c.    Appreciate cards consult. Will also f/u PT eval to make sure safe to be sent home.  labs/images/medications/previous chart entries reviewed personally and relevant changes/updates noted above.

## 2022-04-25 NOTE — Progress Notes (Signed)
Mobility Specialist - Progress Note   04/25/22 1557  Mobility  Activity Ambulated with assistance in room;Transferred from bed to chair  Level of Assistance Standby assist, set-up cues, supervision of patient - no hands on  Assistive Device Cane  Distance Ambulated (ft) 10 ft  Activity Response Tolerated well  $Mobility charge 1 Mobility     Pt sitting EOB on arrival, utilizing RA and requesting assistance to chair. Pt stood with supervision and ambulated around bed to chair. No complaints. Pt left in chair with needs in reach.   Filiberto Pinks Mobility Specialist 04/25/22, 4:01 PM

## 2022-04-26 ENCOUNTER — Ambulatory Visit: Payer: Medicare Other

## 2022-04-26 DIAGNOSIS — I480 Paroxysmal atrial fibrillation: Secondary | ICD-10-CM | POA: Diagnosis not present

## 2022-04-26 DIAGNOSIS — R55 Syncope and collapse: Secondary | ICD-10-CM | POA: Diagnosis not present

## 2022-04-26 DIAGNOSIS — K625 Hemorrhage of anus and rectum: Secondary | ICD-10-CM | POA: Diagnosis not present

## 2022-04-26 LAB — CBC
HCT: 31.4 % — ABNORMAL LOW (ref 39.0–52.0)
Hemoglobin: 11.1 g/dL — ABNORMAL LOW (ref 13.0–17.0)
MCH: 31.4 pg (ref 26.0–34.0)
MCHC: 35.4 g/dL (ref 30.0–36.0)
MCV: 88.7 fL (ref 80.0–100.0)
Platelets: 252 10*3/uL (ref 150–400)
RBC: 3.54 MIL/uL — ABNORMAL LOW (ref 4.22–5.81)
RDW: 13 % (ref 11.5–15.5)
WBC: 8.1 10*3/uL (ref 4.0–10.5)
nRBC: 0 % (ref 0.0–0.2)

## 2022-04-26 LAB — GLUCOSE, CAPILLARY
Glucose-Capillary: 117 mg/dL — ABNORMAL HIGH (ref 70–99)
Glucose-Capillary: 125 mg/dL — ABNORMAL HIGH (ref 70–99)
Glucose-Capillary: 129 mg/dL — ABNORMAL HIGH (ref 70–99)
Glucose-Capillary: 113 mg/dL — ABNORMAL HIGH (ref 70–99)
Glucose-Capillary: 119 mg/dL — ABNORMAL HIGH (ref 70–99)
Glucose-Capillary: 108 mg/dL — ABNORMAL HIGH (ref 70–99)

## 2022-04-26 MED ORDER — OXYCODONE-ACETAMINOPHEN 5-325 MG PO TABS: 1.0000 | ORAL_TABLET | Freq: Three times a day (TID) | ORAL | 0 refills | Status: DC | PRN

## 2022-04-26 MED ORDER — LIDOCAINE 5 % EX OINT: 1.0000 "application " | TOPICAL_OINTMENT | Freq: Three times a day (TID) | CUTANEOUS | 0 refills | Status: AC | PRN

## 2022-04-26 MED ORDER — DOCUSATE SODIUM 100 MG PO CAPS: 100.0000 mg | ORAL_CAPSULE | Freq: Two times a day (BID) | ORAL | 0 refills | Status: AC | PRN

## 2022-04-26 NOTE — Discharge Instructions (Signed)
Hemorrhoids, Care After This sheet gives you information about how to care for yourself after your procedure. Your health care provider may also give you more specific instructions. If you have problems or questions, contact your health care provider. What can I expect after the procedure? After the procedure, it is common to have: Soreness. Bruising. Itching.  Follow these instructions at home: site care Follow instructions from your health care provider about how to take care of your site. Make sure you: Leave stitches (sutures), skin glue, or adhesive strips in place.  If the area bleeds or bruises, apply gentle pressure for 10 minutes. OK TO SHOWER IN 24HRS  General instructions Rest and then return to your normal activities as told by your health care provider. tylenol  as needed for discomfort.   Use narcotics, if prescribed, only when tylenol and motrin is not enough to control pain.  325-650mg  every 8hrs to max of 3000mg /24hrs (including the 325mg  in every norco dose) for the tylenol.   Keep all follow-up visits as told by your health care provider. This is important. Contact a health care provider if you have excessive: redness, swelling, or pain around your site. blood coming from your site. pus or a bad smell coming from your site. You have a fever.  Get help right away if: You have bleeding that does not stop with pressure or a dressing. Summary After the procedure, it is common to have some soreness, bruising, and itching at the site. Follow instructions from your health care provider about how to take care of your site. Keep all follow-up visits as told by your health care provider. This is important. This information is not intended to replace advice given to you by your health care provider. Make sure you discuss any questions you have with your health care provider. Document Released: 12/18/2015 Document Revised: 05/21/2018 Document Reviewed: 05/21/2018 Elsevier  Interactive Patient Education  05/23/2018.

## 2022-04-26 NOTE — Clinical Social Work Note (Cosign Needed)
Occupational Therapy * Physical Therapy * Speech Therapy          DATE __5/23/23_________________ PATIENT NAME_[name]______ PATIENT MRN__[id number]__________________  DIAGNOSIS/DIAGNOSIS CODE ___Rectal Bleed, K62.5____ DATE OF DISCHARGE: _05/24/23_____________  PRIMARY CARE PHYSICIAN ___Krichna Sowles_______________________ PCP PHONE/FAX___[phone]________________________     Dear Provider (Name: __________________   Fax: ___________________________):   I certify that I have examined this patient and that occupational/physical/speech therapy is necessary on an outpatient basis.    The patient has expressed interest in completing their recommended course of therapy at your location.  Once a formal order from the patient's primary care physician has been obtained, please contact him/her to schedule an appointment for evaluation at your earliest convenience.   [  X]  Physical Therapy Evaluate and Treat  [  ]  Occupational Therapy Evaluate and Treat     [  ]  Speech Therapy Evaluate and Treat       The patient's primary care physician (listed above) must furnish and be responsible for a formal order such that the recommended services may be furnished while under the primary physician's care, and that the plan of care will be established and reviewed every 30 days (or more often if condition necessitates).

## 2022-04-26 NOTE — Progress Notes (Signed)
Progress Note  Patient Name: Tom Underwood Date of Encounter: 04/26/2022  [facility] Cardiologist: Kate Sable, MD  Subjective   Patient reports minimal rectal bleeding this AM. Still holding Xarelto. Remains in NSR.   Inpatient Medications    Scheduled Meds:  acetaminophen  1,000 mg Oral Q6H   buPROPion  150 mg Oral q morning   busPIRone  10 mg Oral BID   fluticasone  2 spray Each Nare Daily   loratadine  10 mg Oral Daily   metoprolol tartrate  25 mg Oral BID   pantoprazole (PROTONIX) IV  40 mg Intravenous QHS   rosuvastatin  20 mg Oral QPM   tamsulosin  0.4 mg Oral Daily   Continuous Infusions:  PRN Meds: amitriptyline, HYDROmorphone (DILAUDID) injection, methocarbamol, ondansetron **OR** ondansetron (ZOFRAN) IV, oxyCODONE, polyethylene glycol   Vital Signs    Vitals:   04/25/22 1519 04/25/22 2001 04/26/22 0210 04/26/22 0823  BP: 121/69 119/62 115/64 132/70  Pulse: 95 81 68 91  Resp:  18 18   Temp: 98.3 F (36.8 C) 98.9 F (37.2 C) 98.7 F (37.1 C) 98.5 F (36.9 C)  TempSrc:    Oral  SpO2: 97% 94% 99% 97%  Weight:      Height:        Intake/Output Summary (Last 24 hours) at 04/26/2022 0928 Last data filed at 04/25/2022 1902 Gross per 24 hour  Intake 360 ml  Output --  Net 360 ml      04/23/2022   12:18 PM 04/15/2022    7:05 AM 03/17/2022   10:52 AM  Last 3 Weights  Weight (lbs) 284 lb 6.3 oz 286 lb 288 lb 12.8 oz  Weight (kg) 129 kg 129.729 kg 130.999 kg      Telemetry    NSR, 70-80s - Personally Reviewed  ECG    No new - Personally Reviewed  Physical Exam   GEN: No acute distress.   Neck: No JVD Cardiac: RRR, no murmurs, rubs, or gallops.  Respiratory: Clear to auscultation bilaterally. GI: Soft, nontender, non-distended  MS: No edema; No deformity. Neuro:  Nonfocal  Psych: Normal affect   Labs    High Sensitivity Troponin:   Recent Labs  Lab 04/23/22 1232 04/23/22 1636 04/23/22 1955  TROPONINIHS 10 12 13       Chemistry Recent Labs  Lab 04/23/22 1636 04/23/22 1955 04/24/22 0517  NA 140  138 137 141  K 4.0  3.9 4.1 4.4  CL 108  107 107 106  CO2 25  25 23 28   GLUCOSE 121*  130* 168* 139*  BUN 19  18 19 18   CREATININE 1.02  0.94 0.94 1.10  CALCIUM 8.7*  8.6* 8.6* 9.1  MG 2.0 1.9 2.1  PROT 6.4* 6.6  --   ALBUMIN 3.5 3.5  --   AST 21 25  --   ALT 20 20  --   ALKPHOS 84 84  --   BILITOT 1.4* 1.2  --   GFRNONAA >60  >60 >60 >60  ANIONGAP 7  6 7 7     Lipids No results for input(s): CHOL, TRIG, HDL, LABVLDL, LDLCALC, CHOLHDL in the last 168 hours.  Hematology Recent Labs  Lab 04/23/22 1636 04/23/22 1955 04/24/22 0517  WBC 10.9* 12.6* 10.2  RBC 4.13* 3.98* 3.90*  HGB 12.6* 12.4* 11.9*  HCT 37.3* 35.7* 35.7*  MCV 90.3 89.7 91.5  MCH 30.5 31.2 30.5  MCHC 33.8 34.7 33.3  RDW 12.9 13.0 13.3  PLT 246  264 270   Thyroid No results for input(s): TSH, FREET4 in the last 168 hours.  BNPNo results for input(s): BNP, PROBNP in the last 168 hours.  DDimer No results for input(s): DDIMER in the last 168 hours.   Radiology    No results found.  Cardiac Studies   TTE 04/24/2022 1. Left ventricular ejection fraction, by estimation, is 60 to 65%. Left  ventricular ejection fraction by 2D MOD biplane is 66.3 %. The left  ventricle has normal function. The left ventricle has no regional wall  motion abnormalities. There is mild left  ventricular hypertrophy. Left ventricular diastolic parameters are  consistent with Grade I diastolic dysfunction (impaired relaxation).   2. Right ventricular systolic function is normal. The right ventricular  size is not well visualized.   3. The mitral valve is normal in structure. No evidence of mitral valve  regurgitation.   4. The aortic valve was not well visualized. Aortic valve regurgitation  is not visualized.   5. The inferior vena cava is normal in size with greater than 50%  respiratory variability, suggesting right atrial pressure  of 3 mmHg.   Patient Profile     71 y.o. male with a h/o atrial fibrillation, hypertension, GI bleed who is being seen 04/24/2022 for the evaluation of syncope  Assessment & Plan    Syncope - vasovagal vs orthostatic - no further syncope reported - check CBC today - orthostatics negative - ARB held for borderline pressures. He is on Lopressor 25mg  BID - maintaining NSR - echo with normal LVSF and no significant vavlvular abnormality - can consider heart monitor at discharge   Paroxysmal Afib - in NSR - continue Lopressor 25mg  BID for rate control - resume Xarelto when deemed OK by surgery   HTN - ARB held for borderline pressures - continue Lopressor   GIB/hemorrhoidal bleeding - s/p hemorrhoidectomy and ligation - still with some rectal bleeding today - per surgery  For questions or updates, please contact [facility] Please consult [email or web address] for contact info under        Signed, Kharter Brew Ninfa Meeker, PA-C  04/26/2022, 9:28 AM

## 2022-04-26 NOTE — Plan of Care (Signed)

## 2022-04-26 NOTE — TOC Progression Note (Signed)
Transition of Care [facility]) - Progression Note    Patient Details  Name: Tom Underwood MRN: [id number] Date of Birth: Aug 31, 1951  Transition of [facility]) CM/SW Contact  Maree Krabbe, LCSW Phone Number: 04/26/2022, 3:58 PM  Clinical Narrative:   Pt goes to outpatient PT in [place]. Pt will resume at d/c.          Expected Discharge Plan and Services                                                 Social Determinants of Health (SDOH) Interventions    Readmission Risk Interventions     View : No data to display.

## 2022-04-26 NOTE — Progress Notes (Signed)
Physical Therapy Treatment Patient Details Name: Tom Underwood MRN: [id number] DOB: 19-Dec-1950 Today's Date: 04/26/2022   History of Present Illness Pt admitted for rectal bleed with complaints of syncope leading to code blue and CPR. HIstory includes Afib, HTN, CVA, and GI bleed.    PT Comments    Pt is making good progress towards goals, however elevated HR with exertion; up to 147bpm with ambulation in hallway. SPC used and follows commands well. Able to perform seated there-ex. Will continue to progress as able.   Recommendations for follow up therapy are one component of a multi-disciplinary discharge planning process, led by the attending physician.  Recommendations may be updated based on patient status, additional functional criteria and insurance authorization.  Follow Up Recommendations  Outpatient PT     Assistance Recommended at Discharge PRN  Patient can return home with the following A little help with walking and/or transfers;Help with stairs or ramp for entrance   Equipment Recommendations  None recommended by PT    Recommendations for Other Services       Precautions / Restrictions Precautions Precautions: Fall Restrictions Weight Bearing Restrictions: No     Mobility  Bed Mobility               General bed mobility comments: NT, received in recliner    Transfers Overall transfer level: Needs assistance Equipment used: Straight cane Transfers: Sit to/from Stand Sit to Stand: Modified independent (Device/Increase time)           General transfer comment: safe technique with ability to push up from seated surface.    Ambulation/Gait Ambulation/Gait assistance: Min guard Gait Distance (Feet): 120 Feet Assistive device: Straight cane Gait Pattern/deviations: Step-through pattern       General Gait Details: ambulated in hallway, limited by HR with elevated HR to 147bpm with exertion. Slight SOB symptoms. SPC used   Stairs              Wheelchair Mobility    Modified Rankin (Stroke Patients Only)       Balance Overall balance assessment: Needs assistance Sitting-balance support: Feet supported Sitting balance-Leahy Scale: Good     Standing balance support: Single extremity supported Standing balance-Leahy Scale: Fair                              Cognition Arousal/Alertness: Awake/alert Behavior During Therapy: WFL for tasks assessed/performed Overall Cognitive Status: Within Functional Limits for tasks assessed                                          Exercises Other Exercises Other Exercises: seated ther-ex performed on B LE including LAQ, hip add squeezes, and alt marching. 12 reps with supervision    General Comments        Pertinent Vitals/Pain Pain Assessment Pain Assessment: No/denies pain    Home Living                          Prior Function            PT Goals (current goals can now be found in the care plan section) Acute Rehab PT Goals Patient Stated Goal: to return home PT Goal Formulation: With patient Time For Goal Achievement: 05/09/22 Potential to Achieve Goals: Good Progress towards PT goals: Progressing toward goals  Frequency    Min 2X/week      PT Plan Current plan remains appropriate    Co-evaluation              AM-PAC PT "6 Clicks" Mobility   Outcome Measure  Help needed turning from your back to your side while in a flat bed without using bedrails?: None Help needed moving from lying on your back to sitting on the side of a flat bed without using bedrails?: None Help needed moving to and from a bed to a chair (including a wheelchair)?: None Help needed standing up from a chair using your arms (e.g., wheelchair or bedside chair)?: A Little Help needed to walk in hospital room?: A Little Help needed climbing 3-5 steps with a railing? : A Little 6 Click Score: 21    End of Session Equipment  Utilized During Treatment: Gait belt Activity Tolerance: Patient tolerated treatment well Patient left: in chair Nurse Communication: Mobility status PT Visit Diagnosis: Muscle weakness (generalized) (M62.81);Difficulty in walking, not elsewhere classified (R26.2)     Time: 1601-0932 PT Time Calculation (min) (ACUTE ONLY): 24 min  Charges:  $Gait Training: 8-22 mins $Therapeutic Exercise: 8-22 mins                     Elizabeth Palau, PT, DPT, GCS [phone]    Tom Underwood 04/26/2022, 4:13 PM

## 2022-04-27 LAB — GLUCOSE, CAPILLARY
Glucose-Capillary: 119 mg/dL — ABNORMAL HIGH (ref 70–99)
Glucose-Capillary: 115 mg/dL — ABNORMAL HIGH (ref 70–99)

## 2022-04-27 NOTE — Care Management Important Message (Signed)
Important Message  Patient Details  Name: Tom Underwood MRN: [id number] Date of Birth: 1951/11/04   Medicare Important Message Given:  N/A - LOS <3 / Initial given by admissions     [name] 04/27/2022, 8:30 AM

## 2022-04-27 NOTE — Progress Notes (Signed)
Discharge Note  Patient discharged home via private vehicle with wife. Both PIVs removed with no redness or swelling noted to the site. Patient discharge instructions reviewed with patient and wife; both verbalized understanding. Patient sent home with all personal belongings.

## 2022-04-28 ENCOUNTER — Telehealth: Payer: Self-pay | Admitting: Cardiology

## 2022-04-28 ENCOUNTER — Encounter: Payer: Medicare Other | Admitting: Physical Therapy

## 2022-04-28 NOTE — Discharge Summary (Addendum)
Physician Discharge Summary  Patient ID: Tom Underwood MRN: [id number] DOB/AGE: 1951/09/24 71 y.o.  Admit date: 04/23/2022 Discharge date: 04/28/22  Admission Diagnoses: bright red blood per rectum  Discharge Diagnoses:  Same as above  Discharged Condition: good  Hospital Course: admitted for above. Underwent surgery for delayed bleeding from former hemorrhoidectomy site after restarting xarelto.  Please see op note for details.  Post op, recovered as expected.  At time of d/c, tolerating diet and pain controlled, bleeding from area minimal as expected.  D/c'd xarelto for now after discussion with cardiologist, until area heals completely over next 4-6weeks, understanding increased risk of stroke.  Consults: cardiologist  Discharge Exam: Blood pressure 110/75, pulse 75, temperature 98 F (36.7 C), temperature source Oral, resp. rate 18, height 5\' 10"  (1.778 m), weight 129 kg, SpO2 98 %. General appearance: alert, cooperative, and no distress  Disposition:  Discharge disposition: 01-Home or Self Care        Allergies as of 04/27/2022       Reactions   Penicillins Itching        Medication List     STOP taking these medications    rivaroxaban 20 MG Tabs tablet Commonly known as: Xarelto       TAKE these medications    acetaminophen 325 MG tablet Commonly known as: Tylenol Take 2 tablets (650 mg total) by mouth every 8 (eight) hours as needed for mild pain. What changed: Another medication with the same name was removed. Continue taking this medication, and follow the directions you see here.   amitriptyline 25 MG tablet Commonly known as: ELAVIL TAKE 1 TABLET BY MOUTH AT BEDTIME AS NEEDED FOR SLEEP What changed:  reasons to take this additional instructions   buPROPion 150 MG 24 hr tablet Commonly known as: WELLBUTRIN XL Take 1 tablet (150 mg total) by mouth every morning.   busPIRone 10 MG tablet Commonly known as: BUSPAR Take 1 tablet (10 mg  total) by mouth 2 (two) times daily.   cetirizine 10 MG tablet Commonly known as: ZYRTEC Take 10 mg by mouth in the morning.   docusate sodium 100 MG capsule Commonly known as: Colace Take 1 capsule (100 mg total) by mouth 2 (two) times daily as needed for up to 10 days for mild constipation.   fluticasone 50 MCG/ACT nasal spray Commonly known as: FLONASE Place 2 sprays into both nostrils daily.   furosemide 20 MG tablet Commonly known as: LASIX Take 1 tablet (20 mg total) by mouth as needed. For lower extremity swelling. What changed:  when to take this reasons to take this   GLUCOSAMINE-CHONDROITIN PO Take 2 tablets by mouth in the morning and at bedtime.   lidocaine 5 % ointment Commonly known as: XYLOCAINE Apply 1 application. topically 3 (three) times daily as needed. Apply pea size amount to area as needed   meloxicam 15 MG tablet Commonly known as: MOBIC Take 15 mg by mouth daily as needed for pain.   methocarbamol 500 MG tablet Commonly known as: ROBAXIN Take 500 mg by mouth every 8 (eight) hours as needed for muscle spasms.   metoprolol tartrate 50 MG tablet Commonly known as: LOPRESSOR Take 0.5 tablets (25 mg total) by mouth 2 (two) times daily.   multivitamin tablet Take 1 tablet by mouth daily.   omega-3 acid ethyl esters 1 g capsule Commonly known as: LOVAZA Take 2 capsules (2 g total) by mouth 2 (two) times daily.   oxyCODONE-acetaminophen 5-325 MG tablet Commonly known  as: Percocet Take 1 tablet by mouth every 8 (eight) hours as needed for up to 15 doses for severe pain.   pantoprazole 20 MG tablet Commonly known as: PROTONIX TAKE 1 TABLET(20 MG) BY MOUTH DAILY What changed: See the new instructions.   Probiotic 250 MG Caps Take 1 capsule by mouth daily.   rosuvastatin 20 MG tablet Commonly known as: CRESTOR Take 1 tablet (20 mg total) by mouth daily. What changed: when to take this   tamsulosin 0.4 MG Caps capsule Commonly known as:  FLOMAX Take 1 capsule (0.4 mg total) by mouth daily.   valsartan 80 MG tablet Commonly known as: DIOVAN Take 1 tablet (80 mg total) by mouth daily.   Visine Dry Eye Relief 1 % Soln Generic drug: Polyethylene Glycol 400 Place 1 drop into both eyes daily as needed (dry eyes).   Vitamin D 125 MCG (5000 UT) Caps Take 5,000 Units by mouth daily.        Follow-up Information     [place], Iban Utz, DO Follow up today.   Specialty: Surgery Why: on previously scheduled post op appointment Contact information: [place] [address] [phone]                  Total time spent arranging discharge was >24min. Signed: Benjamine Sprague 04/28/2022, 9:35 AM

## 2022-04-28 NOTE — Telephone Encounter (Signed)
Pt c/o medication issue:  1. Name of Medication:  valsartan (DIOVAN) 80 MG tablet Xarelto  2. How are you currently taking this medication (dosage and times per day)?   3. Are you having a reaction (difficulty breathing--STAT)?   4. What is your medication issue?   Patient is requesting a call back to discuss changes made to his medication while admitted in the hospital.

## 2022-04-28 NOTE — Telephone Encounter (Signed)
Called patient and he asked if he should be taking his Valsartan. Upon review of the cardiology consults with Dr. Okey Dupre and Cadence Fransico [name] while patient was hospitalized, it was recommended that the patient hold is Valsartan for now. I informed patient of this and he verbalized understanding and agreed with plan. I recommended that he take his BP once a day, 1-2 hours after taking his medications and let us know if his BP becomes elevated or drops low. Patient and his wife both verbalized understanding and agreed with plan.  Dr. Okey Dupre 04/26/22 Defer restarting initiation of ARB at this time given normal blood pressure and recent syncopal episode in the setting of acute blood loss.

## 2022-05-11 ENCOUNTER — Ambulatory Visit: Payer: Medicare Other | Attending: Neurology | Admitting: Physical Therapy

## 2022-05-11 DIAGNOSIS — M25511 Pain in right shoulder: Secondary | ICD-10-CM | POA: Insufficient documentation

## 2022-05-11 DIAGNOSIS — R262 Difficulty in walking, not elsewhere classified: Secondary | ICD-10-CM | POA: Diagnosis not present

## 2022-05-11 DIAGNOSIS — M6281 Muscle weakness (generalized): Secondary | ICD-10-CM | POA: Diagnosis not present

## 2022-05-11 DIAGNOSIS — R2681 Unsteadiness on feet: Secondary | ICD-10-CM | POA: Insufficient documentation

## 2022-05-11 DIAGNOSIS — R269 Unspecified abnormalities of gait and mobility: Secondary | ICD-10-CM | POA: Insufficient documentation

## 2022-05-11 DIAGNOSIS — M25611 Stiffness of right shoulder, not elsewhere classified: Secondary | ICD-10-CM | POA: Diagnosis not present

## 2022-05-11 DIAGNOSIS — G8929 Other chronic pain: Secondary | ICD-10-CM | POA: Insufficient documentation

## 2022-05-12 ENCOUNTER — Encounter: Payer: Self-pay | Admitting: Physical Therapy

## 2022-05-12 NOTE — Therapy (Signed)
OUTPATIENT PHYSICAL THERAPY EVALUATION   Patient Name: Tom Underwood MRN: [id number] DOB:1951/10/29, 71 y.o., male Today's Date: 05/12/2022  PCP: Steele Sizer, MD REFERRING PROVIDER: Steele Sizer, MD   PT End of Session - 05/12/22 1246     Visit Number 1    Number of Visits 13    Date for PT Re-Evaluation 08/03/22    PT Start Time 1243    PT Stop Time 1349    PT Time Calculation (min) 66 min    Activity Tolerance Patient tolerated treatment well;Patient limited by fatigue    Behavior During Therapy WFL for tasks assessed/performed             Past Medical History:  Diagnosis Date   Anxiety    Arthritis    Atrial fibrillation ([place])    a.) CHA2DS2-VASc = 4 (age, HTN, CVA x2). b.) s/p 200 J synchronized cardioversion (DCCV) on 12/08/2021. c.) rate/rythum maintained on oral metoprolol tartrate; chronically anticoagulated using rivaroxaban.   BPH (benign prostatic hyperplasia)    Complication of anesthesia    a.)  Intolerant of propofol; causes "skin burning and involuntary muscle twitching"   DDD (degenerative disc disease), lumbar    Decreased libido    Depression    Diverticulitis    Diverticulosis    GERD (gastroesophageal reflux disease)    Hepatic steatosis    History of 2019 novel coronavirus disease (COVID-19) 12/28/2020   History of cardiac murmur in childhood    History of kidney stones    HLD (hyperlipidemia)    HLD (hyperlipidemia)    Hypertension    Hypogonadism in male    Long term current use of anticoagulant    a.) rivaroxaban   OSA on CPAP    Pre-diabetes    Rhinitis, allergic    Stroke ([place]) 07/23/2019   a.) LEFT posterior cor radiata. b.) residual RIGHT sided weakness   Past Surgical History:  Procedure Laterality Date   CARDIOVERSION N/A 12/08/2021   Procedure: CARDIOVERSION;  Surgeon: Nelva Bush, MD;  Location: [facility] ORS;  Service: Cardiovascular;  Laterality: N/A;   COLONOSCOPY     EVALUATION UNDER ANESTHESIA WITH  HEMORRHOIDECTOMY N/A 04/15/2022   Procedure: EXAM UNDER ANESTHESIA WITH HEMORRHOIDECTOMY;  Surgeon: Benjamine Sprague, DO;  Location: [facility] ORS;  Service: General;  Laterality: N/A;   EXTERNAL EAR SURGERY     EYE SURGERY     FINGER SURGERY Right    lateration to 5 th digit   KNEE SURGERY     left eye     RECTAL EXAM UNDER ANESTHESIA N/A 04/23/2022   Procedure: RECTAL EXAM UNDER ANESTHESIA WITH LIGATION OF BLEEDING;  Surgeon: Olean Ree, MD;  Location: [facility] ORS;  Service: General;  Laterality: N/A;   Patient Active Problem List   Diagnosis Date Noted   Paroxysmal A-fib ([place])    Rectal bleeding 04/23/2022   Bleeding internal hemorrhoids    Syncope and collapse    Hemorrhoid 10/18/2021   Obesity, Class III, BMI 40-49.9 (morbid obesity) ([place])    Atrial fibrillation with RVR ([place]) 07/14/2020   Persistent atrial fibrillation ([place]) 07/14/2020   Hypertension    Hemiparesis affecting right side as late effect of cerebrovascular accident ([place]) 08/13/2019   History of kidney stones 05/02/2018   ED (erectile dysfunction) 05/02/2018   Hyperglycemia 01/30/2017   Arthritis of knee, degenerative 01/30/2017   BPH with obstruction/lower urinary tract symptoms 02/03/2016   Gross hematuria 02/03/2016   Dyslipidemia 08/04/2015   Diverticulitis of large intestine without perforation or abscess  without bleeding 06/30/2015   Edema 06/30/2015   OSA on CPAP 06/30/2015    REFERRING DIAG: M25.511,M25.512 (ICD-10-CM) - Bilateral shoulder pain, unspecified chronicity M17.2 (ICD-10-CM) - Post-traumatic osteoarthritis of both knees  R26.81 (ICD-10-CM) - Gait instability    THERAPY DIAG:  Difficulty in walking, not elsewhere classified  Muscle weakness (generalized)  Unsteadiness on feet  PERTINENT HISTORY: See evaluation  PRECAUTIONS: Fall  SUBJECTIVE:  Pt. Has not been seen by outpatient PT since 03/25/22.  Pt. Had a procedure resulting in hospital admit.  Pt. States he had PT with Greggory Stallion, PT,  DPT in acute care working on getting out of bed/ walking.  Pt. States he is limited with sitting time due to procedure and using a donut pillow.  Pt. Had a hemorrhoid procedure on 5/12 and was hospitalized on 04/23/22 due to rectal bleeding after hemorrhoid procedure.  Pt. Reports a decrease in BP while in hospital.  Pt. Has been cleared to be evaluated by outpatient PT at this time.  Pt. Is limited with sitting/ exertional tasks, such as Nustep at this time.    PAIN:  Are you having pain? Yes: NPRS scale: 6/10 Pain location: B knee Pain description: aching Aggravating factors: prolonged standing/ walking Relieving factors: rest     TODAY'S TREATMENT:   See evaluation  5xSTS: 12.66 seconds  Tandem gait requires light UE assist on R in //-bars.  O2 sat: 97%,  HR 63 bpm.  L/R LE muscle strength: hip flexion (5/5, 4/5 MMT), quad (5/5, 4+/5 MMT), hamstring (5/5, 4+/5 MMT).  Pt. Easily fatigues with activity and has increase SOB during standing tasks/ walking in //-bars.    FOTO: initial 33/ goal 50.    Reassessment of B shoulder AROM HEP.     Ther.ex.:   No Nustep today.  Walking in //-bars (forward/ lateral/backwards)- high marching with mirror feedback.  Moderate cuing to slow down and correct upright posture.    Walking with alt. UE/LE touches with consistent recip. Pattern.  Mirror feedback/ cuing to slow down.    Seated marching/ LAQ 20x.     Nautilus seated lat pull down 60#, standing tricep extension 40#, standing scap. Retraction 40# 15x2 each.        PATIENT EDUCATION: Education details: HEP/ daily activity Person educated: Patient Education method: Customer service manager Education comprehension: verbalized understanding and returned demonstration   HOME EXERCISE PROGRAM: Handouts given for shoulder ROM/ strengthening/ standing hip ex.    PT Short Term Goals -      PT SHORT TERM GOAL #1   Title Pt will be indep with HEP to improve strength and  balance to reduce risk of falls.    Baseline 11/23: initiated    Time 4    Period Weeks    Status Achieved              PT Long Term Goals -      PT LONG TERM GOAL #1   Title Pt will improve FOTO score to 50 to display improvement in functional mobility.    Baseline 6/7: 33   Time 12   Period Weeks    Status Initial   Target Date 08/03/22      PT LONG TERM GOAL #2   Title Pt wil improve TUG score to < 12 sec with no AD to display significant imporvement in reduced risk of falls    Baseline 11/23: 26 sec (did not sit down immediately leading to higher time than he would've scored)  1/19: 14.44 sec.   6/7: 12.66 sec.    Time 12    Period Weeks    Status Partially Met    Target Date 08/03/22      PT LONG TERM GOAL #3   Title Pt will improve DGI to >19/24 to indicate decreased risk of falls with household and community walking tasks    Baseline 11/23: 11/24 1/19:12/24    Time 12    Period Weeks    Status Partially Met    Target Date 08/03/22      PT LONG TERM GOAL #4   Title Pt. Will increase R hip flexion/ LE muscle strength 1/2 muscle grade to improve standing tolerance/ walking endurance.    Baseline See above   Time 12    Period Weeks    Status Partially Met    Target Date 08/03/22              Plan - 03/13/22 1824     Clinical Impression Statement Pt. Has returned to PT after recent medical procedure/ hospitalization.  Pt. Presents with a decrease in standing/ walking endurance and B LE muscle weakness.  Pt. Continues to benefit from use of SPC with 2-point gait pattern but requires occasional seated rest breaks secondary to SOB/ fatigue.  B shoulder AROM WFL with discomfort/ stiffness in shoulders and B knees.  B LE edema present during tx. Session.  Pt. Reports heaviness in B lower legs but improved sensation above knees.  Pt. will continue to benefit from supervised PT so that he is able to progress LE dynamic exercises while monitoring his O2 sat rate to  improve functional mobility/ independence.     Personal Factors and Comorbidities Comorbidity 2    Comorbidities Hypertension, Obesity    Examination-Activity Limitations Bed Mobility;Reach Overhead;Squat;Lift;Dressing;Hygiene/Grooming    Examination-Participation Restrictions Community Activity;Yard Work    [employer] officer Evolving/Moderate complexity    Clinical Decision Making Moderate    Rehab Potential Fair    PT Frequency 1x / week    PT Duration 12 weeks    PT Treatment/Interventions ADLs/Self Care Home Management;Gait training;Stair training;Functional mobility training;Therapeutic activities;Therapeutic exercise;Balance training;Neuromuscular re-education;Manual techniques;Patient/family education;Electrical Stimulation;Moist Heat;Cryotherapy    PT Next Visit Plan Progress LE strengthening.    PT Home Exercise Plan Shoulder pulleys in flex, scaption, abd, pendelums, scap retractions with resistance (RTB), D2 flexion pattern in sitting no resistance.    Consulted and Agree with Plan of Care Patient              Pura Spice, PT, DPT # [phone] 05/12/2022, 12:50 PM

## 2022-05-17 ENCOUNTER — Ambulatory Visit (INDEPENDENT_AMBULATORY_CARE_PROVIDER_SITE_OTHER): Payer: Medicare Other

## 2022-05-17 ENCOUNTER — Ambulatory Visit: Payer: Medicare Other | Admitting: Physical Therapy

## 2022-05-17 ENCOUNTER — Encounter: Payer: Self-pay | Admitting: Family Medicine

## 2022-05-17 ENCOUNTER — Ambulatory Visit (INDEPENDENT_AMBULATORY_CARE_PROVIDER_SITE_OTHER): Payer: Medicare Other | Admitting: Family Medicine

## 2022-05-17 VITALS — BP 116/72 | HR 55 | Temp 97.5°F | Resp 17 | Ht 70.0 in | Wt 289.9 lb

## 2022-05-17 VITALS — BP 116/72 | HR 55 | Resp 16 | Ht 70.0 in | Wt 289.0 lb

## 2022-05-17 DIAGNOSIS — E785 Hyperlipidemia, unspecified: Secondary | ICD-10-CM

## 2022-05-17 DIAGNOSIS — F341 Dysthymic disorder: Secondary | ICD-10-CM

## 2022-05-17 DIAGNOSIS — M25611 Stiffness of right shoulder, not elsewhere classified: Secondary | ICD-10-CM

## 2022-05-17 DIAGNOSIS — Z9889 Other specified postprocedural states: Secondary | ICD-10-CM | POA: Diagnosis not present

## 2022-05-17 DIAGNOSIS — Z8719 Personal history of other diseases of the digestive system: Secondary | ICD-10-CM | POA: Diagnosis not present

## 2022-05-17 DIAGNOSIS — R262 Difficulty in walking, not elsewhere classified: Secondary | ICD-10-CM | POA: Diagnosis not present

## 2022-05-17 DIAGNOSIS — R739 Hyperglycemia, unspecified: Secondary | ICD-10-CM

## 2022-05-17 DIAGNOSIS — Z87898 Personal history of other specified conditions: Secondary | ICD-10-CM

## 2022-05-17 DIAGNOSIS — E559 Vitamin D deficiency, unspecified: Secondary | ICD-10-CM

## 2022-05-17 DIAGNOSIS — N400 Enlarged prostate without lower urinary tract symptoms: Secondary | ICD-10-CM

## 2022-05-17 DIAGNOSIS — R2681 Unsteadiness on feet: Secondary | ICD-10-CM | POA: Diagnosis not present

## 2022-05-17 DIAGNOSIS — G4733 Obstructive sleep apnea (adult) (pediatric): Secondary | ICD-10-CM | POA: Diagnosis not present

## 2022-05-17 DIAGNOSIS — R600 Localized edema: Secondary | ICD-10-CM

## 2022-05-17 DIAGNOSIS — I4819 Other persistent atrial fibrillation: Secondary | ICD-10-CM | POA: Diagnosis not present

## 2022-05-17 DIAGNOSIS — R269 Unspecified abnormalities of gait and mobility: Secondary | ICD-10-CM | POA: Diagnosis not present

## 2022-05-17 DIAGNOSIS — M25511 Pain in right shoulder: Secondary | ICD-10-CM | POA: Diagnosis not present

## 2022-05-17 DIAGNOSIS — I69351 Hemiplegia and hemiparesis following cerebral infarction affecting right dominant side: Secondary | ICD-10-CM

## 2022-05-17 DIAGNOSIS — G8929 Other chronic pain: Secondary | ICD-10-CM

## 2022-05-17 DIAGNOSIS — Z23 Encounter for immunization: Secondary | ICD-10-CM

## 2022-05-17 DIAGNOSIS — M6281 Muscle weakness (generalized): Secondary | ICD-10-CM

## 2022-05-17 DIAGNOSIS — K219 Gastro-esophageal reflux disease without esophagitis: Secondary | ICD-10-CM | POA: Diagnosis not present

## 2022-05-17 DIAGNOSIS — Z0000 Encounter for general adult medical examination without abnormal findings: Secondary | ICD-10-CM

## 2022-05-17 DIAGNOSIS — Z9989 Dependence on other enabling machines and devices: Secondary | ICD-10-CM

## 2022-05-17 MED ORDER — SHINGRIX 50 MCG/0.5ML IM SUSR
0.5000 mL | Freq: Once | INTRAMUSCULAR | 0 refills | Status: AC
Start: 2022-05-17 — End: 2022-05-17

## 2022-05-17 MED ORDER — FUROSEMIDE 20 MG PO TABS: 20.0000 mg | ORAL_TABLET | ORAL | 1 refills | Status: DC | PRN

## 2022-05-17 MED ORDER — BUPROPION HCL ER (XL) 150 MG PO TB24: 150.0000 mg | ORAL_TABLET | Freq: Every morning | ORAL | 1 refills | Status: DC

## 2022-05-17 MED ORDER — OMEGA-3-ACID ETHYL ESTERS 1 G PO CAPS: 2.0000 g | ORAL_CAPSULE | Freq: Two times a day (BID) | ORAL | 1 refills | Status: DC

## 2022-05-17 MED ORDER — TAMSULOSIN HCL 0.4 MG PO CAPS: 0.4000 mg | ORAL_CAPSULE | Freq: Every day | ORAL | 1 refills | Status: DC

## 2022-05-17 MED ORDER — PANTOPRAZOLE SODIUM 20 MG PO TBEC: 20.0000 mg | DELAYED_RELEASE_TABLET | Freq: Every day | ORAL | 1 refills | Status: DC

## 2022-05-17 NOTE — Assessment & Plan Note (Signed)
Compliant with CPAP 

## 2022-05-17 NOTE — Assessment & Plan Note (Signed)
On lasix

## 2022-05-17 NOTE — Progress Notes (Signed)
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 mg/dL    Comment: Performed at [facility], [address] [place], [place] [address]  Phosphorus     Status: None   Collection Time: 04/24/22  5:17 AM  Result Value Ref Range   Phosphorus 4.2 2.5 - 4.6 mg/dL    Comment: Performed at [facility], [address] [place], [place] [address]  ECHOCARDIOGRAM COMPLETE     Status: None   Collection Time: 04/24/22  8:57 AM  Result Value Ref Range   Weight 4,550.29 oz   Height 70 in   BP 115/71 mmHg   Ao pk vel 1.38 m/s    AR max vel 2.16 cm2   AV Peak grad 7.6 mmHg   Single Plane A2C EF 63.5 %   Single Plane A4C EF 68.1 %   Calc EF 66.3 %   S' Lateral 3.30 cm   Area-P 1/2 3.83 cm2  Glucose, capillary     Status: Abnormal   Collection Time: 04/24/22 12:02 PM  Result Value Ref Range   Glucose-Capillary 109 (H) 70 - 99 mg/dL    Comment: Glucose reference range applies only to samples taken after fasting for at least 8 hours.  Glucose, capillary     Status: Abnormal   Collection Time: 04/24/22  3:55 PM  Result Value Ref Range   Glucose-Capillary 110 (H) 70 - 99 mg/dL    Comment: Glucose reference range applies only to samples taken after fasting for at least 8 hours.  Glucose, capillary     Status: Abnormal   Collection Time: 04/24/22  8:17 PM  Result Value Ref Range   Glucose-Capillary 120 (H) 70 - 99 mg/dL    Comment: Glucose reference range applies only to samples taken after fasting for at least 8 hours.  Glucose, capillary     Status: Abnormal   Collection Time: 04/25/22  3:39 AM  Result Value Ref Range   Glucose-Capillary 105 (H) 70 - 99 mg/dL    Comment: Glucose reference range applies only to samples taken after fasting for at least 8 hours.  Glucose, capillary     Status: Abnormal   Collection Time: 04/25/22  8:02 AM  Result Value Ref Range   Glucose-Capillary 110 (H) 70 - 99 mg/dL    Comment: Glucose reference range applies only to samples taken after fasting for at least 8 hours.  Glucose, capillary     Status: Abnormal   Collection Time: 04/25/22 11:41 AM  Result Value Ref Range   Glucose-Capillary 101 (H) 70 - 99 mg/dL  Comment: Glucose reference range applies only to samples taken after fasting for at least 8 hours.  Glucose, capillary     Status: Abnormal   Collection Time: 04/25/22  4:46 PM  Result Value Ref Range   Glucose-Capillary 106 (H) 70 - 99 mg/dL    Comment: Glucose reference range applies only to samples taken after fasting for at least 8 hours.  Glucose, capillary      Status: Abnormal   Collection Time: 04/25/22  8:02 PM  Result Value Ref Range   Glucose-Capillary 112 (H) 70 - 99 mg/dL    Comment: Glucose reference range applies only to samples taken after fasting for at least 8 hours.  Glucose, capillary     Status: Abnormal   Collection Time: 04/25/22 11:14 PM  Result Value Ref Range   Glucose-Capillary 134 (H) 70 - 99 mg/dL    Comment: Glucose reference range applies only to samples taken after fasting for at least 8 hours.  Glucose, capillary     Status: Abnormal   Collection Time: 04/26/22  5:05 AM  Result Value Ref Range   Glucose-Capillary 117 (H) 70 - 99 mg/dL    Comment: Glucose reference range applies only to samples taken after fasting for at least 8 hours.  Glucose, capillary     Status: Abnormal   Collection Time: 04/26/22  8:23 AM  Result Value Ref Range   Glucose-Capillary 125 (H) 70 - 99 mg/dL    Comment: Glucose reference range applies only to samples taken after fasting for at least 8 hours.  CBC     Status: Abnormal   Collection Time: 04/26/22  9:40 AM  Result Value Ref Range   WBC 8.1 4.0 - 10.5 K/uL   RBC 3.54 (L) 4.22 - 5.81 MIL/uL   Hemoglobin 11.1 (L) 13.0 - 17.0 g/dL   HCT 16.1 (L) 09.6 - 04.5 %   MCV 88.7 80.0 - 100.0 fL   MCH 31.4 26.0 - 34.0 pg   MCHC 35.4 30.0 - 36.0 g/dL   RDW 40.9 81.1 - 91.4 %   Platelets 252 150 - 400 K/uL   nRBC 0.0 0.0 - 0.2 %    Comment: Performed at [facility], [address] [place], [place] [address]  Glucose, capillary     Status: Abnormal   Collection Time: 04/26/22 11:14 AM  Result Value Ref Range   Glucose-Capillary 129 (H) 70 - 99 mg/dL    Comment: Glucose reference range applies only to samples taken after fasting for at least 8 hours.  Glucose, capillary     Status: Abnormal   Collection Time: 04/26/22  4:00 PM  Result Value Ref Range   Glucose-Capillary 113 (H) 70 - 99 mg/dL    Comment: Glucose reference range applies only to samples taken after fasting for at  least 8 hours.  Glucose, capillary     Status: Abnormal   Collection Time: 04/26/22  7:32 PM  Result Value Ref Range   Glucose-Capillary 119 (H) 70 - 99 mg/dL    Comment: Glucose reference range applies only to samples taken after fasting for at least 8 hours.  Glucose, capillary     Status: Abnormal   Collection Time: 04/26/22 11:29 PM  Result Value Ref Range   Glucose-Capillary 108 (H) 70 - 99 mg/dL    Comment: Glucose reference range applies only to samples taken after fasting for at least 8 hours.  Glucose, capillary     Status: Abnormal   Collection Time: 04/27/22  4:21 AM  Result Value Ref Range   Glucose-Capillary 119 (H) 70 - 99 mg/dL    Comment: Glucose reference range applies only to samples taken after fasting for at least 8 hours.  Glucose, capillary     Status: Abnormal   Collection Time: 04/27/22  7:15 AM  Result Value Ref Range   Glucose-Capillary 115 (H) 70 - 99 mg/dL    Comment: Glucose reference range applies only to samples taken after fasting for at least 8 hours.    PHQ2/9:    05/17/2022   10:36 AM 05/17/2022   10:11 AM 02/25/2022    2:42 PM 11/02/2021    2:38 PM 10/18/2021    2:45 PM  Depression screen PHQ 2/9  Decreased Interest 1 1 0 0 0  Down, Depressed, Hopeless 1 1 0 1 0  PHQ - 2 Score 2 2 0 1 0  Altered sleeping 0 0 0 0 0  Tired, decreased energy 1 1 0 0 0  Change in appetite 0 0 0 0 0  Feeling bad or failure about yourself  0 0 0 0 0  Trouble concentrating 0 0 0 0 0  Moving slowly or fidgety/restless 0 0 0 0 0  Suicidal thoughts 0 0 0 0 0  PHQ-9 Score 3 3 0 1 0  Difficult doing work/chores  Not difficult at all Not difficult at all  Not difficult at all    phq 9 is positive   Fall Risk:    05/17/2022   10:36 AM 05/17/2022   10:14 AM 02/25/2022    2:42 PM 11/02/2021    2:37 PM 10/18/2021    2:45 PM  Fall Risk   Falls in the past year? 0 0 1 0 0  Number falls in past yr: 0 0 0 0 0  Injury with Fall? 0 0 0 0 0  Risk for fall due to :  Impaired balance/gait Impaired balance/gait Impaired balance/gait;Impaired mobility No Fall Risks   Follow up Falls prevention discussed Falls prevention discussed  Falls prevention discussed       Functional Status Survey: Is the patient deaf or have difficulty hearing?: No Does the patient have difficulty seeing, even when wearing glasses/contacts?: No Does the patient have difficulty concentrating, remembering, or making decisions?: No Does the patient have difficulty walking or climbing stairs?: No Does the patient have difficulty dressing or bathing?: Yes Does the patient have difficulty doing errands alone such as visiting a doctor's office or shopping?: Yes    Assessment & Plan  Problem List Items Addressed This Visit     OSA on CPAP    Compliant with CPAP       Dyslipidemia    On statin and lovaza       Relevant Medications   omega-3 acid ethyl esters (LOVAZA) 1 g capsule   Benign prostatic hyperplasia without lower urinary tract symptoms    flomax is not helping with urinary frequency and he will discuss it with her , we will also check PSA       Relevant Medications   tamsulosin (FLOMAX) 0.4 MG CAPS capsule   Other Relevant Orders   PSA   Hemiparesis of right dominant side as late effect of cerebral infarction (HCC) - Primary    He is still using a cane and has PT Xarelto on hold due to recent rectal bleeding after hemorrhoidectomy       Relevant Orders   Lipid panel   Persistent atrial fibrillation (HCC)  Rate controlled on metoprolol       Relevant Medications   furosemide (LASIX) 20 MG tablet   omega-3 acid ethyl esters (LOVAZA) 1 g capsule   Morbid obesity (HCC)   Gastroesophageal reflux disease without esophagitis    Symptoms are stable on medication       Relevant Medications   pantoprazole (PROTONIX) 20 MG tablet   Dysthymia   Relevant Medications   buPROPion (WELLBUTRIN XL) 150 MG 24 hr tablet   History of syncope   History of  hemorrhoidectomy   Vitamin D deficiency   Lower extremity edema   Relevant Medications   furosemide (LASIX) 20 MG tablet   History of rectal bleeding   Relevant Orders   Iron, TIBC and Ferritin Panel   CBC with Differential/Platelet   Hyperglycemia   Relevant Orders   Hemoglobin A1c

## 2022-05-17 NOTE — Assessment & Plan Note (Signed)
flomax is not helping with urinary frequency and he will discuss it with her , we will also check PSA

## 2022-05-17 NOTE — Assessment & Plan Note (Signed)
Rate controlled on metoprolol.  °

## 2022-05-17 NOTE — Assessment & Plan Note (Signed)
Symptoms are stable on medication

## 2022-05-17 NOTE — Progress Notes (Signed)
Subjective:   Tom Underwood is a 71 y.o. male who presents for Medicare Annual/Subsequent preventive examination.  Review of Systems     Cardiac Risk Factors include: advanced age (>37men, >54 women);dyslipidemia;male gender;obesity (BMI >30kg/m2);sedentary lifestyle;hypertension     Objective:    Today's Vitals   05/17/22 1006 05/17/22 1008  BP: 116/72   Pulse: (!) 55   Resp: 17   Temp: (!) 97.5 F (36.4 C)   TempSrc: Oral   SpO2: 96%   Weight: 289 lb 14.4 oz (131.5 kg)   Height: 5\' 10"  (1.778 m)   PainSc:  6    Body mass index is 41.6 kg/m.     05/17/2022   10:13 AM 04/23/2022    6:48 PM 04/15/2022    6:59 AM 04/07/2022    3:21 PM 08/31/2021    2:33 PM 04/22/2021    9:55 AM 07/14/2020    4:38 PM  Advanced Directives  Does Patient Have a Medical Advance Directive? Yes No No Yes No Yes No  Type of Paramedic of [place];Living will   Copy of [place] in Chart? No - copy requested     No - copy requested   Would patient like information on creating a medical advance directive?  No - Patient declined No - Patient declined    No - Patient declined    Current Medications (verified) Outpatient Encounter Medications as of 05/17/2022  Medication Sig   amitriptyline (ELAVIL) 25 MG tablet TAKE 1 TABLET BY MOUTH AT BEDTIME AS NEEDED FOR SLEEP (Patient taking differently: Take 25 mg by mouth at bedtime as needed for sleep.)   buPROPion (WELLBUTRIN XL) 150 MG 24 hr tablet Take 1 tablet (150 mg total) by mouth every morning.   busPIRone (BUSPAR) 10 MG tablet Take 1 tablet (10 mg total) by mouth 2 (two) times daily.   cetirizine (ZYRTEC) 10 MG tablet Take 10 mg by mouth in the morning.    Cholecalciferol (VITAMIN D) 125 MCG (5000 UT) CAPS Take 5,000 Units by mouth daily.   docusate sodium (COLACE) 100 MG capsule Take 100 mg by mouth daily.   fluticasone (FLONASE) 50 MCG/ACT nasal spray Place 2 sprays into  both nostrils daily.   GLUCOSAMINE-CHONDROITIN PO Take 2 tablets by mouth in the morning and at bedtime.   lidocaine (XYLOCAINE) 5 % ointment Apply 1 application. topically 3 (three) times daily as needed. Apply pea size amount to area as needed   meloxicam (MOBIC) 15 MG tablet Take 15 mg by mouth daily as needed for pain.   methocarbamol (ROBAXIN) 500 MG tablet Take 500 mg by mouth every 8 (eight) hours as needed for muscle spasms.   metoprolol tartrate (LOPRESSOR) 50 MG tablet Take 0.5 tablets (25 mg total) by mouth 2 (two) times daily.   Multiple Vitamin (MULTIVITAMIN) tablet Take 1 tablet by mouth daily.   omega-3 acid ethyl esters (LOVAZA) 1 g capsule Take 2 capsules (2 g total) by mouth 2 (two) times daily.   pantoprazole (PROTONIX) 20 MG tablet TAKE 1 TABLET(20 MG) BY MOUTH DAILY (Patient taking differently: Take 20 mg by mouth daily.)   rivaroxaban (XARELTO) 20 MG TABS tablet Take 20 mg by mouth daily with supper.   rosuvastatin (CRESTOR) 20 MG tablet Take 1 tablet (20 mg total) by mouth daily. (Patient taking differently: Take 20 mg by mouth every evening.)   Saccharomyces boulardii (PROBIOTIC) 250 MG CAPS Take 1  capsule by mouth daily.   tamsulosin (FLOMAX) 0.4 MG CAPS capsule Take 1 capsule (0.4 mg total) by mouth daily.   valsartan (DIOVAN) 80 MG tablet Take 80 mg by mouth daily.   VISINE DRY EYE RELIEF 1 % SOLN Place 1 drop into both eyes daily as needed (dry eyes).   furosemide (LASIX) 20 MG tablet Take 1 tablet (20 mg total) by mouth as needed. For lower extremity swelling. (Patient taking differently: Take 20 mg by mouth daily as needed for edema. For lower extremity swelling.)   [DISCONTINUED] oxyCODONE-acetaminophen (PERCOCET) 5-325 MG tablet Take 1 tablet by mouth every 8 (eight) hours as needed for up to 15 doses for severe pain.   No facility-administered encounter medications on file as of 05/17/2022.    Allergies (verified) Penicillins   History: Past Medical History:   Diagnosis Date   Anxiety    Arthritis    Atrial fibrillation (HCC)    a.) CHA2DS2-VASc = 4 (age, HTN, CVA x2). b.) s/p 200 J synchronized cardioversion (DCCV) on 12/08/2021. c.) rate/rythum maintained on oral metoprolol tartrate; chronically anticoagulated using rivaroxaban.   BPH (benign prostatic hyperplasia)    Complication of anesthesia    a.)  Intolerant of propofol; causes "skin burning and involuntary muscle twitching"   DDD (degenerative disc disease), lumbar    Decreased libido    Depression    Diverticulitis    Diverticulosis    GERD (gastroesophageal reflux disease)    Hepatic steatosis    History of 2019 novel coronavirus disease (COVID-19) 12/28/2020   History of cardiac murmur in childhood    History of kidney stones    HLD (hyperlipidemia)    HLD (hyperlipidemia)    Hypertension    Hypogonadism in male    Long term current use of anticoagulant    a.) rivaroxaban   OSA on CPAP    Pre-diabetes    Rhinitis, allergic    Stroke ([place]) 07/23/2019   a.) LEFT posterior cor radiata. b.) residual RIGHT sided weakness   Past Surgical History:  Procedure Laterality Date   CARDIOVERSION N/A 12/08/2021   Procedure: CARDIOVERSION;  Surgeon: Nelva Bush, MD;  Location: [facility] ORS;  Service: Cardiovascular;  Laterality: N/A;   COLONOSCOPY     EVALUATION UNDER ANESTHESIA WITH HEMORRHOIDECTOMY N/A 04/15/2022   Procedure: EXAM UNDER ANESTHESIA WITH HEMORRHOIDECTOMY;  Surgeon: Benjamine Sprague, DO;  Location: [facility] ORS;  Service: General;  Laterality: N/A;   EXTERNAL EAR SURGERY     EYE SURGERY     FINGER SURGERY Right    lateration to 5 th digit   KNEE SURGERY     left eye     RECTAL EXAM UNDER ANESTHESIA N/A 04/23/2022   Procedure: RECTAL EXAM UNDER ANESTHESIA WITH LIGATION OF BLEEDING;  Surgeon: Olean Ree, MD;  Location: [facility] ORS;  Service: General;  Laterality: N/A;   Family History  Problem Relation Age of Onset   Asthma Mother    Heart disease Mother    Heart attack  Mother    Dementia Mother    Asthma Father    Heart disease Father    Stroke Father    Social History   Socioeconomic History   Marital status: Married    Spouse name: Butch Penny    Number of children: 2   Years of education: Not on file   Highest education level: Not on file  Occupational History   Occupation: retired   Tobacco Use   Smoking status: Former    Types: Pipe  Start date: 02/20/1973    Quit date: 02/20/1974    Years since quitting: 48.2   Smokeless tobacco: Never  Vaping Use   Vaping Use: Never used  Substance and Sexual Activity   Alcohol use: No    Alcohol/week: 0.0 standard drinks of alcohol   Drug use: No   Sexual activity: Yes    Partners: Female  Other Topics Concern   Not on file  Social History Narrative   Not on file   Social Determinants of Health   Financial Resource Strain: Low Risk  (05/17/2022)   Overall Financial Resource Strain (CARDIA)    Difficulty of Paying Living Expenses: Not hard at all  Food Insecurity: No Food Insecurity (05/17/2022)   Hunger Vital Sign    Worried About Running Out of Food in the Last Year: Never true    Ran Out of Food in the Last Year: Never true  Transportation Needs: No Transportation Needs (05/17/2022)   PRAPARE - Hydrologist (Medical): No    Lack of Transportation (Non-Medical): No  Physical Activity: Insufficiently Active (05/17/2022)   Exercise Vital Sign    Days of Exercise per Week: 1 day    Minutes of Exercise per Session: 60 min  Stress: No Stress Concern Present (05/17/2022)   Star    Feeling of Stress : Not at all  Social Connections: [place] (05/17/2022)   Social Connection and Isolation Panel [NHANES]    Frequency of Communication with Friends and Family: More than three times a week    Frequency of Social Gatherings with Friends and Family: Twice a week    Attends Religious Services:  More than 4 times per year    Active Member of [employer] or Organizations: Yes    Attends Music therapist: More than 4 times per year    Marital Status: Married    Tobacco Counseling Counseling given: Not Answered   Clinical Intake:  Pre-visit preparation completed: Yes  Pain : 0-10 Pain Score: 6  Pain Type: Chronic pain Pain Location: Knee Pain Orientation: Right, Left Pain Descriptors / Indicators: Aching, Sore Pain Onset: More than a month ago Pain Frequency: Constant     BMI - recorded: 41.6 Nutritional Status: BMI > 30  Obese Nutritional Risks: None Diabetes: No  How often do you need to have someone help you when you read instructions, pamphlets, or other written materials from your doctor or pharmacy?: 1 - Never    Interpreter Needed?: No  Information entered by :: Clemetine Marker LPN   Activities of Daily Living    05/17/2022   10:15 AM 04/23/2022    5:09 PM  In your present state of health, do you have any difficulty performing the following activities:  Hearing? 0   Vision? 0   Difficulty concentrating or making decisions? 0   Walking or climbing stairs? 1   Dressing or bathing? 1   Doing errands, shopping? 1 0  Comment shopping due to difficulty walking   Preparing Food and eating ? N   Using the Toilet? N   In the past six months, have you accidently leaked urine? N   Do you have problems with loss of bowel control? N   Managing your Medications? N   Managing your Finances? N   Housekeeping or managing your Housekeeping? N     Patient Care Team: Steele Sizer, MD as PCP - General (Family Medicine) Kate Sable,  MD as PCP - Cardiology (Cardiology) Vickie Epley, MD as PCP - Electrophysiology (Cardiology) Gardiner Barefoot, DPM as Consulting Physician (Podiatry) Laneta Simmers as Physician Assistant (Urology) Germaine Pomfret, [facility] (Pharmacist) Vladimir Crofts, MD as Consulting Physician (Neurology) Benjamine Sprague,  DO as Consulting Physician (Surgery) Pura Spice, PT as Physical Therapist (Physical Therapy)  Indicate any recent Medical Services you may have received from other than [facility] providers in the past year (date may be approximate).     Assessment:   This is a routine wellness examination for [place].  Hearing/Vision screen Hearing Screening - Comments:: Pt denies hearing difficulty Vision Screening - Comments:: Annual vision screenings done at [place] issues and exercise activities discussed: Current Exercise Habits: Home exercise routine, Type of exercise: Other - see comments (physical therapy), Time (Minutes): 60, Frequency (Times/Week): 1, Weekly Exercise (Minutes/Week): 60, Intensity: Mild, Exercise limited by: orthopedic condition(s)   Goals Addressed   None    Depression Screen    05/17/2022   10:11 AM 02/25/2022    2:42 PM 11/02/2021    2:38 PM 10/18/2021    2:45 PM 10/18/2021    2:39 PM 06/01/2021    3:23 PM 05/17/2021    9:07 AM  PHQ 2/9 Scores  PHQ - 2 Score 2 0 1 0 0 0 2  PHQ- 9 Score 3 0 1 0 0 0 3    Fall Risk    05/17/2022   10:14 AM 02/25/2022    2:42 PM 11/02/2021    2:37 PM 10/18/2021    2:45 PM 10/18/2021    2:39 PM  [place] in the past year? 0 1 0 0 0  Number falls in past yr: 0 0 0 0 0  Injury with Fall? 0 0 0 0 0  Risk for fall due to : Impaired balance/gait Impaired balance/gait;Impaired mobility No Fall Risks  No Fall Risks  Follow up Falls prevention discussed  Falls prevention discussed  Falls prevention discussed    FALL RISK PREVENTION PERTAINING TO THE HOME:  Any stairs in or around the home? Yes  If so, are there any without handrails? No  Home free of loose throw rugs in walkways, pet beds, electrical cords, etc? Yes  Adequate lighting in your home to reduce risk of falls? Yes   ASSISTIVE DEVICES UTILIZED TO PREVENT FALLS:  Life alert? No  Use of a cane, walker or w/c? Yes  Grab bars in the bathroom?  Yes  Shower chair or bench in shower? Yes  Elevated toilet seat or a handicapped toilet? Yes   TIMED UP AND GO:  Was the test performed? Yes .  Length of time to ambulate 10 feet: 8 sec.   Gait slow and steady with assistive device  Cognitive Function: Normal cognitive status assessed by direct observation by this Nurse Health Advisor. No abnormalities found.          04/21/2020    9:43 AM  6CIT Screen  What Year? 0 points  What month? 0 points  What time? 0 points  Count back from 20 0 points  Months in reverse 0 points  Repeat phrase 0 points  Total Score 0 points    Immunizations Immunization History  Administered Date(s) Administered   Fluad Quad(high Dose 65+) 09/18/2019, 11/10/2020, 10/18/2021   Influenza, High Dose Seasonal PF 10/05/2018   Influenza,inj,Quad PF,6+ Mos 08/30/2017   Influenza-Unspecified 09/26/2016   Moderna Covid-19 Vaccine Bivalent Booster 69yrs &  up 03/31/2022   Moderna Sars-Covid-2 Vaccination 01/21/2020, 02/18/2020, 11/23/2020   Pneumococcal Conjugate-13 11/05/2019   Pneumococcal Polysaccharide-23 10/19/2018   Tdap 02/16/2018, 07/14/2020   Zoster, Live 02/24/2017    TDAP status: Up to date  Flu Vaccine status: Up to date  Pneumococcal vaccine status: Up to date  Covid-19 vaccine status: Completed vaccines  Qualifies for Shingles Vaccine? Yes   Zostavax completed Yes   Shingrix Completed?: No.    Education has been provided regarding the importance of this vaccine. Patient has been advised to call insurance company to determine out of pocket expense if they have not yet received this vaccine. Advised may also receive vaccine at local pharmacy or Health Dept. Verbalized acceptance and understanding.  Screening Tests Health Maintenance  Topic Date Due   Zoster Vaccines- Shingrix (1 of 2) Never done   INFLUENZA VACCINE  07/05/2022   COLONOSCOPY (Pts 45-54yrs Insurance coverage will need to be confirmed)  09/27/2023   TETANUS/TDAP   07/14/2030   Pneumonia Vaccine 25+ Years old  Completed   COVID-19 Vaccine  Completed   Hepatitis C Screening  Completed   HPV VACCINES  Aged Out    Health Maintenance  Health Maintenance Due  Topic Date Due   Zoster Vaccines- Shingrix (1 of 2) Never done    Colorectal cancer screening: Type of screening: Colonoscopy. Completed 2014. Repeat every 10 years  Lung Cancer Screening: (Low Dose CT Chest recommended if Age 68-80 years, 30 pack-year currently smoking OR have quit w/in 15years.) does not qualify.   Additional Screening:  Hepatitis C Screening: does qualify; Completed 02/16/18  Vision Screening: Recommended annual ophthalmology exams for early detection of glaucoma and other disorders of the eye. Is the patient up to date with their annual eye exam?  Yes  Who is the provider or what is the name of the office in which the patient attends annual eye exams? [facility].   Dental Screening: Recommended annual dental exams for proper oral hygiene  Community Resource Referral / Chronic Care Management: CRR required this visit?  No   CCM required this visit?  No      Plan:     I have personally reviewed and noted the following in the patient's chart:   Medical and social history Use of alcohol, tobacco or illicit drugs  Current medications and supplements including opioid prescriptions. Patient is not currently taking opioid prescriptions. Functional ability and status Nutritional status Physical activity Advanced directives List of other physicians Hospitalizations, surgeries, and ER visits in previous 12 months Vitals Screenings to include cognitive, depression, and falls Referrals and appointments  In addition, I have reviewed and discussed with patient certain preventive protocols, quality metrics, and best practice recommendations. A written personalized care plan for preventive services as well as general preventive health recommendations were  provided to patient.     Clemetine Marker, LPN   [id number]   Nurse Notes: none

## 2022-05-17 NOTE — Patient Instructions (Signed)
[name] , Thank you for taking time to come for your Medicare Wellness Visit. I appreciate your ongoing commitment to your health goals. Please review the following plan we discussed and let me know if I can assist you in the future.   Screening recommendations/referrals: Colonoscopy: done 2014; due 2024 Recommended yearly ophthalmology/optometry visit for glaucoma screening and checkup Recommended yearly dental visit for hygiene and checkup  Vaccinations: Influenza vaccine: done 10/18/21 Pneumococcal vaccine: done 11/05/19 Tdap vaccine: done 07/14/20 Shingles vaccine: Shingrix discussed. Please contact your pharmacy for coverage information.  Covid-19: done 01/21/20, 02/18/20, 11/23/20 & 03/31/22  Advanced directives: Please bring a copy of your health care power of attorney and living will to the office at your convenience.   Conditions/risks identified: recommend continuing physical activity to increase strength and mobility  Next appointment: Follow up in one year for your annual wellness visit.   Preventive Care 69 Years and Older, Male Preventive care refers to lifestyle choices and visits with your health care provider that can promote health and wellness. What does preventive care include? A yearly physical exam. This is also called an annual well check. Dental exams once or twice a year. Routine eye exams. Ask your health care provider how often you should have your eyes checked. Personal lifestyle choices, including: Daily care of your teeth and gums. Regular physical activity. Eating a healthy diet. Avoiding tobacco and drug use. Limiting alcohol use. Practicing safe sex. Taking low doses of aspirin every day. Taking vitamin and mineral supplements as recommended by your health care provider. What happens during an annual well check? The services and screenings done by your health care provider during your annual well check will depend on your age, overall health,  lifestyle risk factors, and family history of disease. Counseling  Your health care provider may ask you questions about your: Alcohol use. Tobacco use. Drug use. Emotional well-being. Home and relationship well-being. Sexual activity. Eating habits. History of falls. Memory and ability to understand (cognition). Work and work Astronomer. Screening  You may have the following tests or measurements: Height, weight, and BMI. Blood pressure. Lipid and cholesterol levels. These may be checked every 5 years, or more frequently if you are over 33 years old. Skin check. Lung cancer screening. You may have this screening every year starting at age 69 if you have a 30-pack-year history of smoking and currently smoke or have quit within the past 15 years. Fecal occult blood test (FOBT) of the stool. You may have this test every year starting at age 69. Flexible sigmoidoscopy or colonoscopy. You may have a sigmoidoscopy every 5 years or a colonoscopy every 10 years starting at age 69. Prostate cancer screening. Recommendations will vary depending on your family history and other risks. Hepatitis C blood test. Hepatitis B blood test. Sexually transmitted disease (STD) testing. Diabetes screening. This is done by checking your blood sugar (glucose) after you have not eaten for a while (fasting). You may have this done every 1-3 years. Abdominal aortic aneurysm (AAA) screening. You may need this if you are a current or former smoker. Osteoporosis. You may be screened starting at age 69 if you are at high risk. Talk with your health care provider about your test results, treatment options, and if necessary, the need for more tests. Vaccines  Your health care provider may recommend certain vaccines, such as: Influenza vaccine. This is recommended every year. Tetanus, diphtheria, and acellular pertussis (Tdap, Td) vaccine. You may need a Td booster every  10 years. Zoster vaccine. You may need this  after age 20. Pneumococcal 13-valent conjugate (PCV13) vaccine. One dose is recommended after age 47. Pneumococcal polysaccharide (PPSV23) vaccine. One dose is recommended after age 31. Talk to your health care provider about which screenings and vaccines you need and how often you need them. This information is not intended to replace advice given to you by your health care provider. Make sure you discuss any questions you have with your health care provider. Document Released: 12/18/2015 Document Revised: 08/10/2016 Document Reviewed: 09/22/2015 Elsevier Interactive Patient Education  2017 [place] Prevention in the Home Falls can cause injuries. They can happen to people of all ages. There are many things you can do to make your home safe and to help prevent falls. What can I do on the outside of my home? Regularly fix the edges of walkways and driveways and fix any cracks. Remove anything that might make you trip as you walk through a door, such as a raised step or threshold. Trim any bushes or trees on the path to your home. Use bright outdoor lighting. Clear any walking paths of anything that might make someone trip, such as rocks or tools. Regularly check to see if handrails are loose or broken. Make sure that both sides of any steps have handrails. Any raised decks and porches should have guardrails on the edges. Have any leaves, snow, or ice cleared regularly. Use sand or salt on walking paths during winter. Clean up any spills in your garage right away. This includes oil or grease spills. What can I do in the bathroom? Use night lights. Install grab bars by the toilet and in the tub and shower. Do not use towel bars as grab bars. Use non-skid mats or decals in the tub or shower. If you need to sit down in the shower, use a plastic, non-slip stool. Keep the floor dry. Clean up any water that spills on the floor as soon as it happens. Remove soap buildup in the tub or  shower regularly. Attach bath mats securely with double-sided non-slip rug tape. Do not have throw rugs and other things on the floor that can make you trip. What can I do in the bedroom? Use night lights. Make sure that you have a light by your bed that is easy to reach. Do not use any sheets or blankets that are too big for your bed. They should not hang down onto the floor. Have a firm chair that has side arms. You can use this for support while you get dressed. Do not have throw rugs and other things on the floor that can make you trip. What can I do in the kitchen? Clean up any spills right away. Avoid walking on wet floors. Keep items that you use a lot in easy-to-reach places. If you need to reach something above you, use a strong step stool that has a grab bar. Keep electrical cords out of the way. Do not use floor polish or wax that makes floors slippery. If you must use wax, use non-skid floor wax. Do not have throw rugs and other things on the floor that can make you trip. What can I do with my stairs? Do not leave any items on the stairs. Make sure that there are handrails on both sides of the stairs and use them. Fix handrails that are broken or loose. Make sure that handrails are as long as the stairways. Check any carpeting to make  sure that it is firmly attached to the stairs. Fix any carpet that is loose or worn. Avoid having throw rugs at the top or bottom of the stairs. If you do have throw rugs, attach them to the floor with carpet tape. Make sure that you have a light switch at the top of the stairs and the bottom of the stairs. If you do not have them, ask someone to add them for you. What else can I do to help prevent falls? Wear shoes that: Do not have high heels. Have rubber bottoms. Are comfortable and fit you well. Are closed at the toe. Do not wear sandals. If you use a stepladder: Make sure that it is fully opened. Do not climb a closed stepladder. Make  sure that both sides of the stepladder are locked into place. Ask someone to hold it for you, if possible. Clearly mark and make sure that you can see: Any grab bars or handrails. First and last steps. Where the edge of each step is. Use tools that help you move around (mobility aids) if they are needed. These include: Canes. Walkers. Scooters. Crutches. Turn on the lights when you go into a dark area. Replace any light bulbs as soon as they burn out. Set up your furniture so you have a clear path. Avoid moving your furniture around. If any of your floors are uneven, fix them. If there are any pets around you, be aware of where they are. Review your medicines with your doctor. Some medicines can make you feel dizzy. This can increase your chance of falling. Ask your doctor what other things that you can do to help prevent falls. This information is not intended to replace advice given to you by your health care provider. Make sure you discuss any questions you have with your health care provider. Document Released: 09/17/2009 Document Revised: 04/28/2016 Document Reviewed: 12/26/2014 Elsevier Interactive Patient Education  2017 [employer].

## 2022-05-17 NOTE — Assessment & Plan Note (Signed)
On statin and lovaza

## 2022-05-17 NOTE — Assessment & Plan Note (Signed)
He is still using a cane and has PT Xarelto on hold due to recent rectal bleeding after hemorrhoidectomy

## 2022-05-21 NOTE — Therapy (Signed)
OUTPATIENT PHYSICAL THERAPY EVALUATION   Patient Name: Tom Underwood MRN: [id number] DOB:[birth year], 71 y.o., male Today's Date: 05/21/2022  PCP: Steele Sizer, MD REFERRING PROVIDER: Myles Gip, DO   PT End of Session - 05/21/22 1628     Visit Number 2    Number of Visits 13    Date for PT Re-Evaluation 08/03/22    PT Start Time 1316    PT Stop Time 1420    PT Time Calculation (min) 64 min    Activity Tolerance Patient tolerated treatment well;Patient limited by fatigue    Behavior During Therapy WFL for tasks assessed/performed             Past Medical History:  Diagnosis Date   Anxiety    Arthritis    Atrial fibrillation ([place])    a.) CHA2DS2-VASc = 4 (age, HTN, CVA x2). b.) s/p 200 J synchronized cardioversion (DCCV) on 12/08/2021. c.) rate/rythum maintained on oral metoprolol tartrate; chronically anticoagulated using rivaroxaban.   BPH (benign prostatic hyperplasia)    Complication of anesthesia    a.)  Intolerant of propofol; causes "skin burning and involuntary muscle twitching"   DDD (degenerative disc disease), lumbar    Decreased libido    Depression    Diverticulitis    Diverticulosis    GERD (gastroesophageal reflux disease)    Hepatic steatosis    History of 2019 novel coronavirus disease (COVID-19) 12/28/2020   History of cardiac murmur in childhood    History of kidney stones    HLD (hyperlipidemia)    HLD (hyperlipidemia)    Hypertension    Hypogonadism in male    Long term current use of anticoagulant    a.) rivaroxaban   OSA on CPAP    Pre-diabetes    Rhinitis, allergic    Stroke ([place]) 07/23/2019   a.) LEFT posterior cor radiata. b.) residual RIGHT sided weakness   Past Surgical History:  Procedure Laterality Date   CARDIOVERSION N/A 12/08/2021   Procedure: CARDIOVERSION;  Surgeon: Nelva Bush, MD;  Location: [facility] ORS;  Service: Cardiovascular;  Laterality: N/A;   COLONOSCOPY     EVALUATION UNDER ANESTHESIA WITH  HEMORRHOIDECTOMY N/A 04/15/2022   Procedure: EXAM UNDER ANESTHESIA WITH HEMORRHOIDECTOMY;  Surgeon: Benjamine Sprague, DO;  Location: [facility] ORS;  Service: General;  Laterality: N/A;   EXTERNAL EAR SURGERY     EYE SURGERY     FINGER SURGERY Right    lateration to 5 th digit   KNEE SURGERY     left eye     RECTAL EXAM UNDER ANESTHESIA N/A 04/23/2022   Procedure: RECTAL EXAM UNDER ANESTHESIA WITH LIGATION OF BLEEDING;  Surgeon: Olean Ree, MD;  Location: [facility] ORS;  Service: General;  Laterality: N/A;   Patient Active Problem List   Diagnosis Date Noted   Gastroesophageal reflux disease without esophagitis 05/17/2022   Dysthymia 05/17/2022   History of syncope 05/17/2022   History of hemorrhoidectomy 05/17/2022   Vitamin D deficiency 05/17/2022   Lower extremity edema 05/17/2022   History of rectal bleeding 05/17/2022   Bleeding internal hemorrhoids    Morbid obesity ([name])    Persistent atrial fibrillation ([name]) 07/14/2020   Hypertension    Hemiparesis of right dominant side as late effect of cerebral infarction ([name]) 08/13/2019   History of kidney stones 05/02/2018   ED (erectile dysfunction) 05/02/2018   Hyperglycemia 01/30/2017   Arthritis of knee, degenerative 01/30/2017   Benign prostatic hyperplasia without lower urinary tract symptoms 02/03/2016   Dyslipidemia 08/04/2015  Diverticulitis of large intestine without perforation or abscess without bleeding 06/30/2015   OSA on CPAP 06/30/2015    REFERRING DIAG: M25.511,M25.512 (ICD-10-CM) - Bilateral shoulder pain, unspecified chronicity M17.2 (ICD-10-CM) - Post-traumatic osteoarthritis of both knees  R26.81 (ICD-10-CM) - Gait instability    THERAPY DIAG:  Difficulty in walking, not elsewhere classified  Muscle weakness (generalized)  Unsteadiness on feet  Abnormality of gait and mobility  Decreased range of motion of right shoulder  Chronic right shoulder pain  PERTINENT HISTORY: See evaluation  PRECAUTIONS:  Fall  SUBJECTIVE:  Pt. Had a MD f/u with surgeon and states stitches are still in place/ healing well from recent surgery.  Pt. Has continued to use donut pillow while sitting at home.  Pt. Had f/u with PCP and discussed BP/ medication management.    PAIN:  Are you having pain? Yes: NPRS scale: 6/10 Pain location: B knee Pain description: aching Aggravating factors: prolonged standing/ walking Relieving factors: rest     TODAY'S TREATMENT:   05/17/22:  There.ex.:  Seated LE ex.: 4# marching/ LAQ/ heel and toe raises 20x each.  Cuing to slow down/ increase ROM  STS from gray chair 10x.  Standing 6" step touches at stairs (alt. L/R)- 10x2 each.  Standing ex.: 4# hip abduction/ hamstring curls/ marching 20x each in //-bars with mirror feedback.    Resisted gait 2BTB lateral walking L/R 6x each.    Tandem stance/ gait in //-bars (light UE assist required)  Standing posture correction/ wt. Wand shoulder flexion and chest press 20x each.  Pain tolerable sh. Range.    No Nustep.  Walking in clinic/ outside working on increasing R LE stance during L swing through phase of gait.  No LOB with and without SPC.      See evaluation  5xSTS: 12.66 seconds  Tandem gait requires light UE assist on R in //-bars.  O2 sat: 97%,  HR 63 bpm.  L/R LE muscle strength: hip flexion (5/5, 4/5 MMT), quad (5/5, 4+/5 MMT), hamstring (5/5, 4+/5 MMT).  Pt. Easily fatigues with activity and has increase SOB during standing tasks/ walking in //-bars.    FOTO: initial 33/ goal 50.    Reassessment of B shoulder AROM HEP.     Ther.ex.:   No Nustep today.  Walking in //-bars (forward/ lateral/backwards)- high marching with mirror feedback.  Moderate cuing to slow down and correct upright posture.    Walking with alt. UE/LE touches with consistent recip. Pattern.  Mirror feedback/ cuing to slow down.    Seated marching/ LAQ 20x.     Nautilus seated lat pull down 60#, standing tricep extension  40#, standing scap. Retraction 40# 15x2 each.        PATIENT EDUCATION: Education details: HEP/ daily activity Person educated: Patient Education method: Customer service manager Education comprehension: verbalized understanding and returned demonstration   HOME EXERCISE PROGRAM: Handouts given for shoulder ROM/ strengthening/ standing hip ex.    PT Short Term Goals -      PT SHORT TERM GOAL #1   Title Pt will be indep with HEP to improve strength and balance to reduce risk of falls.    Baseline 11/23: initiated    Time 4    Period Weeks    Status Achieved              PT Long Term Goals -      PT LONG TERM GOAL #1   Title Pt will improve FOTO score to 50 to display  improvement in functional mobility.    Baseline 6/7: 33   Time 12   Period Weeks    Status Initial   Target Date 08/03/22      PT LONG TERM GOAL #2   Title Pt wil improve TUG score to < 12 sec with no AD to display significant imporvement in reduced risk of falls    Baseline 11/23: 26 sec (did not sit down immediately leading to higher time than he would've scored) 1/19: 14.44 sec.   6/7: 12.66 sec.    Time 12    Period Weeks    Status Partially Met    Target Date 08/03/22      PT LONG TERM GOAL #3   Title Pt will improve DGI to >19/24 to indicate decreased risk of falls with household and community walking tasks    Baseline 11/23: 11/24 1/19:12/24    Time 12    Period Weeks    Status Partially Met    Target Date 08/03/22      PT LONG TERM GOAL #4   Title Pt. Will increase R hip flexion/ LE muscle strength 1/2 muscle grade to improve standing tolerance/ walking endurance.    Baseline See above   Time 12    Period Weeks    Status Partially Met    Target Date 08/03/22              Plan - 03/13/22 1824     Clinical Impression Statement Pt. Remains limited with R LE muscle strength as compared to L LE.  Pt. Ambulates with decrease R LE stance, esp. With standing hip ex. On L LE.   No LOB during tx. But moderate LE endurance issues and several short seated rest breaks required.  Pt. Unable to complete Nustep at this time secondary to recent surgery/ seated position.  No change to HEP.  Pt. Will continue to benefit from skilled PT services to increase B LE muscle strength to improve walking/ balance and overall functional mobility.       Personal Factors and Comorbidities Comorbidity 2    Comorbidities Hypertension, Obesity    Examination-Activity Limitations Bed Mobility;Reach Overhead;Squat;Lift;Dressing;Hygiene/Grooming    Examination-Participation Restrictions Community Activity;Yard Work    Merchant navy officer Evolving/Moderate complexity    Clinical Decision Making Moderate    Rehab Potential Fair    PT Frequency 1x / week    PT Duration 12 weeks    PT Treatment/Interventions ADLs/Self Care Home Management;Gait training;Stair training;Functional mobility training;Therapeutic activities;Therapeutic exercise;Balance training;Neuromuscular re-education;Manual techniques;Patient/family education;Electrical Stimulation;Moist Heat;Cryotherapy    PT Next Visit Plan Progress LE strengthening.    PT Home Exercise Plan Shoulder pulleys in flex, scaption, abd, pendelums, scap retractions with resistance (RTB), D2 flexion pattern in sitting no resistance.    Consulted and Agree with Plan of Care Patient              Pura Spice, PT, DPT # [phone] 05/21/2022, 4:34 PM

## 2022-05-26 ENCOUNTER — Ambulatory Visit: Payer: Medicare Other | Admitting: Physical Therapy

## 2022-05-26 ENCOUNTER — Encounter: Payer: Self-pay | Admitting: Physical Therapy

## 2022-05-26 DIAGNOSIS — R269 Unspecified abnormalities of gait and mobility: Secondary | ICD-10-CM | POA: Diagnosis not present

## 2022-05-26 DIAGNOSIS — M25511 Pain in right shoulder: Secondary | ICD-10-CM | POA: Diagnosis not present

## 2022-05-26 DIAGNOSIS — M25611 Stiffness of right shoulder, not elsewhere classified: Secondary | ICD-10-CM

## 2022-05-26 DIAGNOSIS — R2681 Unsteadiness on feet: Secondary | ICD-10-CM | POA: Diagnosis not present

## 2022-05-26 DIAGNOSIS — R262 Difficulty in walking, not elsewhere classified: Secondary | ICD-10-CM | POA: Diagnosis not present

## 2022-05-26 DIAGNOSIS — G8929 Other chronic pain: Secondary | ICD-10-CM

## 2022-05-26 DIAGNOSIS — M6281 Muscle weakness (generalized): Secondary | ICD-10-CM | POA: Diagnosis not present

## 2022-05-26 NOTE — Therapy (Signed)
OUTPATIENT PHYSICAL THERAPY EVALUATION   Patient Name: Tom Underwood MRN: [id number] DOB:27-Mar-1951, 71 y.o., male Today's Date: 05/26/2022  PCP: Steele Sizer, MD REFERRING PROVIDER: Steele Sizer, MD   PT End of Session - 05/26/22 1022     Visit Number 3    Number of Visits 13    Date for PT Re-Evaluation 08/03/22    PT Start Time 1022    PT Stop Time 1113    PT Time Calculation (min) 51 min    Activity Tolerance Patient tolerated treatment well;Patient limited by fatigue    Behavior During Therapy WFL for tasks assessed/performed             Past Medical History:  Diagnosis Date   Anxiety    Arthritis    Atrial fibrillation ([place])    a.) CHA2DS2-VASc = 4 (age, HTN, CVA x2). b.) s/p 200 J synchronized cardioversion (DCCV) on 12/08/2021. c.) rate/rythum maintained on oral metoprolol tartrate; chronically anticoagulated using rivaroxaban.   BPH (benign prostatic hyperplasia)    Complication of anesthesia    a.)  Intolerant of propofol; causes "skin burning and involuntary muscle twitching"   DDD (degenerative disc disease), lumbar    Decreased libido    Depression    Diverticulitis    Diverticulosis    GERD (gastroesophageal reflux disease)    Hepatic steatosis    History of 2019 novel coronavirus disease (COVID-19) 12/28/2020   History of cardiac murmur in childhood    History of kidney stones    HLD (hyperlipidemia)    HLD (hyperlipidemia)    Hypertension    Hypogonadism in male    Long term current use of anticoagulant    a.) rivaroxaban   OSA on CPAP    Pre-diabetes    Rhinitis, allergic    Stroke ([place]) 07/23/2019   a.) LEFT posterior cor radiata. b.) residual RIGHT sided weakness   Past Surgical History:  Procedure Laterality Date   CARDIOVERSION N/A 12/08/2021   Procedure: CARDIOVERSION;  Surgeon: Nelva Bush, MD;  Location: [facility];  Service: Cardiovascular;  Laterality: N/A;   COLONOSCOPY     EVALUATION UNDER ANESTHESIA WITH  HEMORRHOIDECTOMY N/A 04/15/2022   Procedure: EXAM UNDER ANESTHESIA WITH HEMORRHOIDECTOMY;  Surgeon: Benjamine Sprague, DO;  Location: [facility];  Service: General;  Laterality: N/A;   EXTERNAL EAR SURGERY     EYE SURGERY     FINGER SURGERY Right    lateration to 5 th digit   KNEE SURGERY     left eye     RECTAL EXAM UNDER ANESTHESIA N/A 04/23/2022   Procedure: RECTAL EXAM UNDER ANESTHESIA WITH LIGATION OF BLEEDING;  Surgeon: Olean Ree, MD;  Location: [facility];  Service: General;  Laterality: N/A;   Patient Active Problem List   Diagnosis Date Noted   Gastroesophageal reflux disease without esophagitis 05/17/2022   Dysthymia 05/17/2022   History of syncope 05/17/2022   History of hemorrhoidectomy 05/17/2022   Vitamin D deficiency 05/17/2022   Lower extremity edema 05/17/2022   History of rectal bleeding 05/17/2022   Bleeding internal hemorrhoids    Morbid obesity ([place])    Persistent atrial fibrillation ([place]) 07/14/2020   Hypertension    Hemiparesis of right dominant side as late effect of cerebral infarction ([place]) 08/13/2019   History of kidney stones 05/02/2018   ED (erectile dysfunction) 05/02/2018   Hyperglycemia 01/30/2017   Arthritis of knee, degenerative 01/30/2017   Benign prostatic hyperplasia without lower urinary tract symptoms 02/03/2016   Dyslipidemia 08/04/2015   Diverticulitis  of large intestine without perforation or abscess without bleeding 06/30/2015   OSA on CPAP 06/30/2015    REFERRING DIAG: M25.511,M25.512 (ICD-10-CM) - Bilateral shoulder pain, unspecified chronicity M17.2 (ICD-10-CM) - Post-traumatic osteoarthritis of both knees  R26.81 (ICD-10-CM) - Gait instability    THERAPY DIAG:  Difficulty in walking, not elsewhere classified  Muscle weakness (generalized)  Unsteadiness on feet  Abnormality of gait and mobility  Decreased range of motion of right shoulder  Chronic right shoulder pain  PERTINENT HISTORY: See evaluation  PRECAUTIONS:  Fall  SUBJECTIVE:  Pt. Entered PT with marked swelling in B lower legs.  Pt. Reports 7-8/10 B knee pain today and improved sensation in B LE above knees.  No falls reported.    PAIN:  Are you having pain? Yes: NPRS scale: 7-8/10 Pain location: B knee Pain description: aching Aggravating factors: prolonged standing/ walking Relieving factors: rest    TODAY'S TREATMENT:   05/26/22:  There.ex.:  No Nustep secondary to continued rectal bleeding (pt. Reports minimal).  BP: 142/64.  HR: 60 bpm  Seated 4# LE ex.: marching/ LAQ/ heel and toe raises 20x each.  Standing 4# LE ex.: marching/ walking in //-bars/ 6" hurdles (no UE assist required)- recip. pattern/ hamstring curls 20x each.  SBA for safety/ verbal cuing and mirror feedback.    Stairs (recip. Pattern with ascending/ step to pattern with descending- L foot first) with UE assist on handrails.  Pt. Returns to step to gait ascending stairs with increase fatigue in LE.      Walking in hallway/ outside with and without use of SPC working on walking cadence/ consistent step pattern.    No changes to HEP    05/17/22:  There.ex.:  Seated LE ex.: 4# marching/ LAQ/ heel and toe raises 20x each.  Cuing to slow down/ increase ROM  STS from gray chair 10x.  Standing 6" step touches at stairs (alt. L/R)- 10x2 each.  Standing ex.: 4# hip abduction/ hamstring curls/ marching 20x each in //-bars with mirror feedback.    Resisted gait 2BTB lateral walking L/R 6x each.    Tandem stance/ gait in //-bars (light UE assist required)  Standing posture correction/ wt. Wand shoulder flexion and chest press 20x each.  Pain tolerable sh. Range.    No Nustep.  Walking in clinic/ outside working on increasing R LE stance during L swing through phase of gait.  No LOB with and without SPC.      See evaluation  5xSTS: 12.66 seconds  Tandem gait requires light UE assist on R in //-bars.  O2 sat: 97%,  HR 63 bpm.  L/R LE muscle strength: hip  flexion (5/5, 4/5 MMT), quad (5/5, 4+/5 MMT), hamstring (5/5, 4+/5 MMT).  Pt. Easily fatigues with activity and has increase SOB during standing tasks/ walking in //-bars.    FOTO: initial 33/ goal 50.    Reassessment of B shoulder AROM HEP.     Ther.ex.:   No Nustep today.  Walking in //-bars (forward/ lateral/backwards)- high marching with mirror feedback.  Moderate cuing to slow down and correct upright posture.    Walking with alt. UE/LE touches with consistent recip. Pattern.  Mirror feedback/ cuing to slow down.    Seated marching/ LAQ 20x.     Nautilus seated lat pull down 60#, standing tricep extension 40#, standing scap. Retraction 40# 15x2 each.        PATIENT EDUCATION: Education details: HEP/ daily activity Person educated: Patient Education method: Customer service manager Education comprehension:  verbalized understanding and returned demonstration   HOME EXERCISE PROGRAM: Handouts given for shoulder ROM/ strengthening/ standing hip ex.    PT Short Term Goals -      PT SHORT TERM GOAL #1   Title Pt will be indep with HEP to improve strength and balance to reduce risk of falls.    Baseline 11/23: initiated    Time 4    Period Weeks    Status Achieved              PT Long Term Goals -      PT LONG TERM GOAL #1   Title Pt will improve FOTO score to 50 to display improvement in functional mobility.    Baseline 6/7: 33   Time 12   Period Weeks    Status Initial   Target Date 08/03/22      PT LONG TERM GOAL #2   Title Pt wil improve TUG score to < 12 sec with no AD to display significant imporvement in reduced risk of falls    Baseline 11/23: 26 sec (did not sit down immediately leading to higher time than he would've scored) 1/19: 14.44 sec.   6/7: 12.66 sec.    Time 12    Period Weeks    Status Partially Met    Target Date 08/03/22      PT LONG TERM GOAL #3   Title Pt will improve DGI to >19/24 to indicate decreased risk of falls with  household and community walking tasks    Baseline 11/23: 11/24 1/19:12/24    Time 12    Period Weeks    Status Partially Met    Target Date 08/03/22      PT LONG TERM GOAL #4   Title Pt. Will increase R hip flexion/ LE muscle strength 1/2 muscle grade to improve standing tolerance/ walking endurance.    Baseline See above   Time 12    Period Weeks    Status Partially Met    Target Date 08/03/22              Plan -     Clinical Impression Statement Pts. Gait pattern changes while ascending/ descending stairs with increase LE muscle fatigue.  Pt. Prefers step to gait for safety with fatigue, as tx. Progresses.  Pt. Cautious with gait, esp. While walking outside and requires occasional seated rest breaks.  Pt. Presents with moderate B lower leg swelling and continued R LE muscle weakness.  Pt. Will continue to benefit from skilled PT services to increase B LE muscle strength to improve walking/ balance and overall functional mobility.       Personal Factors and Comorbidities Comorbidity 2    Comorbidities Hypertension, Obesity    Examination-Activity Limitations Bed Mobility;Reach Overhead;Squat;Lift;Dressing;Hygiene/Grooming    Examination-Participation Restrictions Community Activity;Yard Work    Merchant navy officer Evolving/Moderate complexity    Clinical Decision Making Moderate    Rehab Potential Fair    PT Frequency 1x / week    PT Duration 12 weeks    PT Treatment/Interventions ADLs/Self Care Home Management;Gait training;Stair training;Functional mobility training;Therapeutic activities;Therapeutic exercise;Balance training;Neuromuscular re-education;Manual techniques;Patient/family education;Electrical Stimulation;Moist Heat;Cryotherapy    PT Next Visit Plan Progress LE strengthening.    PT Home Exercise Plan Shoulder pulleys in flex, scaption, abd, pendelums, scap retractions with resistance (RTB), D2 flexion pattern in sitting no resistance.    Consulted and  Agree with Plan of Care Patient  Pura Spice, PT, DPT # [phone] 05/26/2022, 1:13 PM

## 2022-05-31 ENCOUNTER — Ambulatory Visit: Payer: Medicare Other | Admitting: Physical Therapy

## 2022-05-31 ENCOUNTER — Encounter: Payer: Self-pay | Admitting: Physical Therapy

## 2022-05-31 DIAGNOSIS — R269 Unspecified abnormalities of gait and mobility: Secondary | ICD-10-CM

## 2022-05-31 DIAGNOSIS — M25611 Stiffness of right shoulder, not elsewhere classified: Secondary | ICD-10-CM

## 2022-05-31 DIAGNOSIS — G8929 Other chronic pain: Secondary | ICD-10-CM

## 2022-05-31 DIAGNOSIS — R2681 Unsteadiness on feet: Secondary | ICD-10-CM | POA: Diagnosis not present

## 2022-05-31 DIAGNOSIS — R262 Difficulty in walking, not elsewhere classified: Secondary | ICD-10-CM | POA: Diagnosis not present

## 2022-05-31 DIAGNOSIS — M6281 Muscle weakness (generalized): Secondary | ICD-10-CM | POA: Diagnosis not present

## 2022-05-31 DIAGNOSIS — M25511 Pain in right shoulder: Secondary | ICD-10-CM | POA: Diagnosis not present

## 2022-05-31 NOTE — Therapy (Signed)
OUTPATIENT PHYSICAL THERAPY TREATMENT   Patient Name: Tom Underwood MRN: [id number] DOB:October 12, 1951, 71 y.o., male Today's Date: 05/31/2022  PCP: Alba Cory, MD REFERRING PROVIDER: Alba Cory, MD   PT End of Session - 05/31/22 1248     Visit Number 4    Number of Visits 13    Date for PT Re-Evaluation 08/03/22    PT Start Time 1248    PT Stop Time 1342    PT Time Calculation (min) 54 min    Activity Tolerance Patient tolerated treatment well;Patient limited by fatigue    Behavior During Therapy [facility] for tasks assessed/performed             Past Medical History:  Diagnosis Date   Anxiety    Arthritis    Atrial fibrillation ([facility])    a.) CHA2DS2-VASc = 4 (age, HTN, CVA x2). b.) s/p 200 J synchronized cardioversion (DCCV) on 12/08/2021. c.) rate/rythum maintained on oral metoprolol tartrate; chronically anticoagulated using rivaroxaban.   BPH (benign prostatic hyperplasia)    Complication of anesthesia    a.)  Intolerant of propofol; causes "skin burning and involuntary muscle twitching"   DDD (degenerative disc disease), lumbar    Decreased libido    Depression    Diverticulitis    Diverticulosis    GERD (gastroesophageal reflux disease)    Hepatic steatosis    History of 2019 novel coronavirus disease (COVID-19) 12/28/2020   History of cardiac murmur in childhood    History of kidney stones    HLD (hyperlipidemia)    HLD (hyperlipidemia)    Hypertension    Hypogonadism in male    Long term current use of anticoagulant    a.) rivaroxaban   OSA on CPAP    Pre-diabetes    Rhinitis, allergic    Stroke ([facility]) 07/23/2019   a.) LEFT posterior cor radiata. b.) residual RIGHT sided weakness   Past Surgical History:  Procedure Laterality Date   CARDIOVERSION N/A 12/08/2021   Procedure: CARDIOVERSION;  Surgeon: Yvonne Kendall, MD;  Location: [facility];  Service: Cardiovascular;  Laterality: N/A;   COLONOSCOPY     EVALUATION UNDER ANESTHESIA WITH  HEMORRHOIDECTOMY N/A 04/15/2022   Procedure: EXAM UNDER ANESTHESIA WITH HEMORRHOIDECTOMY;  Surgeon: Sung Amabile, DO;  Location: [facility];  Service: General;  Laterality: N/A;   EXTERNAL EAR SURGERY     EYE SURGERY     FINGER SURGERY Right    lateration to 5 th digit   KNEE SURGERY     left eye     RECTAL EXAM UNDER ANESTHESIA N/A 04/23/2022   Procedure: RECTAL EXAM UNDER ANESTHESIA WITH LIGATION OF BLEEDING;  Surgeon: Henrene Dodge, MD;  Location: [facility];  Service: General;  Laterality: N/A;   Patient Active Problem List   Diagnosis Date Noted   Gastroesophageal reflux disease without esophagitis 05/17/2022   Dysthymia 05/17/2022   History of syncope 05/17/2022   History of hemorrhoidectomy 05/17/2022   Vitamin D deficiency 05/17/2022   Lower extremity edema 05/17/2022   History of rectal bleeding 05/17/2022   Bleeding internal hemorrhoids    Morbid obesity ([facility])    Persistent atrial fibrillation ([facility]) 07/14/2020   Hypertension    Hemiparesis of right dominant side as late effect of cerebral infarction ([facility]) 08/13/2019   History of kidney stones 05/02/2018   ED (erectile dysfunction) 05/02/2018   Hyperglycemia 01/30/2017   Arthritis of knee, degenerative 01/30/2017   Benign prostatic hyperplasia without lower urinary tract symptoms 02/03/2016   Dyslipidemia 08/04/2015   Diverticulitis  of large intestine without perforation or abscess without bleeding 06/30/2015   OSA on CPAP 06/30/2015    REFERRING DIAG: M25.511,M25.512 (ICD-10-CM) - Bilateral shoulder pain, unspecified chronicity M17.2 (ICD-10-CM) - Post-traumatic osteoarthritis of both knees  R26.81 (ICD-10-CM) - Gait instability    THERAPY DIAG:  Difficulty in walking, not elsewhere classified  Muscle weakness (generalized)  Unsteadiness on feet  Abnormality of gait and mobility  Decreased range of motion of right shoulder  Chronic right shoulder pain  PERTINENT HISTORY: See evaluation  PRECAUTIONS:  Fall  SUBJECTIVE:  Pt. Entered PT with marked swelling in R lower leg and reports 8/10 pain in B knees.  Pt. States the sensation is coming back in LE and going down legs.    PAIN:  Are you having pain? Yes: NPRS scale: 7-8/10 Pain location: B knee Pain description: aching Aggravating factors: prolonged standing/ walking Relieving factors: rest    TODAY'S TREATMENT:   05/31/22:  No Nustep at this time.    There.ex.:   O2 sat: 95%/ HR 83 bpm.  Seated/ standing 5# LE ex. Program: LAQ/ marching/ heel and toe raises/ walking forward and backwards/ lateral 5 laps (no UE assist)- in //-bars.    Walking in clinic with increase hip flexion/ step pattern.  Cuing to correct posture.  Sit to stands from gray chair with no UE assist.   Neuro:  Standing ball toss: NBOS/ modified tandem gait L/R in //-bars with yellow ball.  No LOB  Walking in //-bars: recip. Steps over green hurdles/ cone taps.    Tandem Airex: forward/ lateral 3x in //-bars.  Banker.     05/26/22:  There.ex.:  No Nustep secondary to continued rectal bleeding (pt. Reports minimal).  BP: 142/64.  HR: 60 bpm  Seated 4# LE ex.: marching/ LAQ/ heel and toe raises 20x each.  Standing 4# LE ex.: marching/ walking in //-bars/ 6" hurdles (no UE assist required)- recip. pattern/ hamstring curls 20x each.  SBA for safety/ verbal cuing and mirror feedback.    Stairs (recip. Pattern with ascending/ step to pattern with descending- L foot first) with UE assist on handrails.  Pt. Returns to step to gait ascending stairs with increase fatigue in LE.      Walking in hallway/ outside with and without use of SPC working on walking cadence/ consistent step pattern.    No changes to HEP   05/17/22:  There.ex.:  Seated LE ex.: 4# marching/ LAQ/ heel and toe raises 20x each.  Cuing to slow down/ increase ROM  STS from gray chair 10x.  Standing 6" step touches at stairs (alt. L/R)- 10x2 each.  Standing ex.: 4# hip  abduction/ hamstring curls/ marching 20x each in //-bars with mirror feedback.    Resisted gait 2BTB lateral walking L/R 6x each.    Tandem stance/ gait in //-bars (light UE assist required)  Standing posture correction/ wt. Wand shoulder flexion and chest press 20x each.  Pain tolerable sh. Range.    No Nustep.  Walking in clinic/ outside working on increasing R LE stance during L swing through phase of gait.  No LOB with and without SPC.      See evaluation  5xSTS: 12.66 seconds  Tandem gait requires light UE assist on R in //-bars.  O2 sat: 97%,  HR 63 bpm.  L/R LE muscle strength: hip flexion (5/5, 4/5 MMT), quad (5/5, 4+/5 MMT), hamstring (5/5, 4+/5 MMT).  Pt. Easily fatigues with activity and has increase SOB during standing tasks/ walking  in //-bars.    FOTO: initial 33/ goal 50.    Reassessment of B shoulder AROM HEP.     Ther.ex.:   No Nustep today.  Walking in //-bars (forward/ lateral/backwards)- high marching with mirror feedback.  Moderate cuing to slow down and correct upright posture.    Walking with alt. UE/LE touches with consistent recip. Pattern.  Mirror feedback/ cuing to slow down.    Seated marching/ LAQ 20x.     Nautilus seated lat pull down 60#, standing tricep extension 40#, standing scap. Retraction 40# 15x2 each.        PATIENT EDUCATION: Education details: HEP/ daily activity Person educated: Patient Education method: Medical illustrator Education comprehension: verbalized understanding and returned demonstration   HOME EXERCISE PROGRAM: Handouts given for shoulder ROM/ strengthening/ standing hip ex.    PT Short Term Goals -      PT SHORT TERM GOAL #1   Title Pt will be indep with HEP to improve strength and balance to reduce risk of falls.    Baseline 11/23: initiated    Time 4    Period Weeks    Status Achieved              PT Long Term Goals -      PT LONG TERM GOAL #1   Title Pt will improve FOTO score  to 50 to display improvement in functional mobility.    Baseline 6/7: 33   Time 12   Period Weeks    Status Initial   Target Date 08/03/22      PT LONG TERM GOAL #2   Title Pt wil improve TUG score to < 12 sec with no AD to display significant imporvement in reduced risk of falls    Baseline 11/23: 26 sec (did not sit down immediately leading to higher time than he would've scored) 1/19: 14.44 sec.   6/7: 12.66 sec.    Time 12    Period Weeks    Status Partially Met    Target Date 08/03/22      PT LONG TERM GOAL #3   Title Pt will improve DGI to >19/24 to indicate decreased risk of falls with household and community walking tasks    Baseline 11/23: 11/24 1/19:12/24    Time 12    Period Weeks    Status Partially Met    Target Date 08/03/22      PT LONG TERM GOAL #4   Title Pt. Will increase R hip flexion/ LE muscle strength 1/2 muscle grade to improve standing tolerance/ walking endurance.    Baseline See above   Time 12    Period Weeks    Status Partially Met    Target Date 08/03/22              Plan -     Clinical Impression Statement Pt. Presents with moderate B lower leg swelling (R > L) and continued R LE muscle weakness.  Pt. Challenged with walking cone taps/ dynamic balance tasks in //-bars without UE assist.  NO LOB during tx. Session and able to complete Airex with min. To no UE assist.  Pt. Requires several short seated rest breaks during tx. Session due to fatigue with prolonged standing/walking tasks.  Pt. Will continue to benefit from skilled PT services to increase B LE muscle strength to improve walking/ balance and overall functional mobility.       Personal Factors and Comorbidities Comorbidity 2    Comorbidities Hypertension, Obesity  Examination-Activity Limitations Bed Mobility;Reach Overhead;Squat;Lift;Dressing;Hygiene/Grooming    Examination-Participation Restrictions Community Activity;Yard Work    Conservation officer, historic buildings  Evolving/Moderate complexity    Clinical Decision Making Moderate    Rehab Potential Fair    PT Frequency 1x / week    PT Duration 12 weeks    PT Treatment/Interventions ADLs/Self Care Home Management;Gait training;Stair training;Functional mobility training;Therapeutic activities;Therapeutic exercise;Balance training;Neuromuscular re-education;Manual techniques;Patient/family education;Electrical Stimulation;Moist Heat;Cryotherapy    PT Next Visit Plan Progress LE strengthening.    PT Home Exercise Plan Shoulder pulleys in flex, scaption, abd, pendelums, scap retractions with resistance (RTB), D2 flexion pattern in sitting no resistance.    Consulted and Agree with Plan of Care Patient              Cammie Mcgee, PT, DPT # [phone] 05/31/2022, 5:23 PM

## 2022-06-08 ENCOUNTER — Ambulatory Visit: Payer: Medicare Other | Attending: Family Medicine | Admitting: Physical Therapy

## 2022-06-08 ENCOUNTER — Encounter: Payer: Self-pay | Admitting: Physical Therapy

## 2022-06-08 DIAGNOSIS — R262 Difficulty in walking, not elsewhere classified: Secondary | ICD-10-CM | POA: Insufficient documentation

## 2022-06-08 DIAGNOSIS — M6281 Muscle weakness (generalized): Secondary | ICD-10-CM | POA: Diagnosis not present

## 2022-06-08 DIAGNOSIS — R279 Unspecified lack of coordination: Secondary | ICD-10-CM | POA: Insufficient documentation

## 2022-06-08 DIAGNOSIS — R2689 Other abnormalities of gait and mobility: Secondary | ICD-10-CM | POA: Diagnosis not present

## 2022-06-08 DIAGNOSIS — R2681 Unsteadiness on feet: Secondary | ICD-10-CM | POA: Diagnosis not present

## 2022-06-08 DIAGNOSIS — R269 Unspecified abnormalities of gait and mobility: Secondary | ICD-10-CM | POA: Insufficient documentation

## 2022-06-08 NOTE — Therapy (Addendum)
OUTPATIENT PHYSICAL THERAPY TREATMENT   Patient Name: Tom Underwood MRN: [id number] DOB:[birth year], 71 y.o., male Today's Date: 06/08/2022  PCP: Steele Sizer, MD REFERRING PROVIDER: Myles Gip, DO   PT End of Session - 06/08/22 1451     Visit Number 5    Number of Visits 13    Date for PT Re-Evaluation 08/03/22    PT Start Time 5597    PT Stop Time 1357    PT Time Calculation (min) 61 min    Activity Tolerance Patient tolerated treatment well;Patient limited by fatigue    Behavior During Therapy WFL for tasks assessed/performed             Past Medical History:  Diagnosis Date   Anxiety    Arthritis    Atrial fibrillation ([place])    a.) CHA2DS2-VASc = 4 (age, HTN, CVA x2). b.) s/p 200 J synchronized cardioversion (DCCV) on 12/08/2021. c.) rate/rythum maintained on oral metoprolol tartrate; chronically anticoagulated using rivaroxaban.   BPH (benign prostatic hyperplasia)    Complication of anesthesia    a.)  Intolerant of propofol; causes "skin burning and involuntary muscle twitching"   DDD (degenerative disc disease), lumbar    Decreased libido    Depression    Diverticulitis    Diverticulosis    GERD (gastroesophageal reflux disease)    Hepatic steatosis    History of 2019 novel coronavirus disease (COVID-19) 12/28/2020   History of cardiac murmur in childhood    History of kidney stones    HLD (hyperlipidemia)    HLD (hyperlipidemia)    Hypertension    Hypogonadism in male    Long term current use of anticoagulant    a.) rivaroxaban   OSA on CPAP    Pre-diabetes    Rhinitis, allergic    Stroke ([place]) 07/23/2019   a.) LEFT posterior cor radiata. b.) residual RIGHT sided weakness   Past Surgical History:  Procedure Laterality Date   CARDIOVERSION N/A 12/08/2021   Procedure: CARDIOVERSION;  Surgeon: Nelva Bush, MD;  Location: [facility];  Service: Cardiovascular;  Laterality: N/A;   COLONOSCOPY     EVALUATION UNDER ANESTHESIA WITH  HEMORRHOIDECTOMY N/A 04/15/2022   Procedure: EXAM UNDER ANESTHESIA WITH HEMORRHOIDECTOMY;  Surgeon: Benjamine Sprague, DO;  Location: [facility];  Service: General;  Laterality: N/A;   EXTERNAL EAR SURGERY     EYE SURGERY     FINGER SURGERY Right    lateration to 5 th digit   KNEE SURGERY     left eye     RECTAL EXAM UNDER ANESTHESIA N/A 04/23/2022   Procedure: RECTAL EXAM UNDER ANESTHESIA WITH LIGATION OF BLEEDING;  Surgeon: Olean Ree, MD;  Location: [facility];  Service: General;  Laterality: N/A;   Patient Active Problem List   Diagnosis Date Noted   Gastroesophageal reflux disease without esophagitis 05/17/2022   Dysthymia 05/17/2022   History of syncope 05/17/2022   History of hemorrhoidectomy 05/17/2022   Vitamin D deficiency 05/17/2022   Lower extremity edema 05/17/2022   History of rectal bleeding 05/17/2022   Bleeding internal hemorrhoids    Morbid obesity ([place])    Persistent atrial fibrillation ([place]) 07/14/2020   Hypertension    Hemiparesis of right dominant side as late effect of cerebral infarction ([place]) 08/13/2019   History of kidney stones 05/02/2018   ED (erectile dysfunction) 05/02/2018   Hyperglycemia 01/30/2017   Arthritis of knee, degenerative 01/30/2017   Benign prostatic hyperplasia without lower urinary tract symptoms 02/03/2016   Dyslipidemia 08/04/2015  Diverticulitis of large intestine without perforation or abscess without bleeding 06/30/2015   OSA on CPAP 06/30/2015    REFERRING DIAG: M25.511,M25.512 (ICD-10-CM) - Bilateral shoulder pain, unspecified chronicity M17.2 (ICD-10-CM) - Post-traumatic osteoarthritis of both knees  R26.81 (ICD-10-CM) - Gait instability    THERAPY DIAG:  Difficulty in walking, not elsewhere classified  Muscle weakness (generalized)  Other abnormalities of gait and mobility  Unsteadiness on feet  Unspecified lack of coordination  Abnormality of gait and mobility  PERTINENT HISTORY: See evaluation  PRECAUTIONS:  Fall  SUBJECTIVE:  Pt has minimal pain in LE's but still has marked swelling in B LE's. Pt ambulated throughout a vacation this past weekend with [facility], and but trouble walking over uneven surfaces.   PAIN:  Are you having pain? Yes: NPRS scale: 1-2/10 Pain location: B knee Pain description: aching Aggravating factors: prolonged standing/ walking Relieving factors: rest    TODAY'S TREATMENT:   06/08/22:  No Nustep   Walking in // bars with 5# weights around ankles - down and back 4 x without hand hold assistance from // bars  Walking over 6" hurdles in // bars with 5# weights around ankles - down and back 4 times - 24 hurdles in total   Stepping up onto 6" step, and stepping off of step within // bars - forward and backward 6 x each   Walking on airex balance beam - heel/toe gait 3 x forward and backward  Transitioning gait from blue airex pad to 3" wooden plinth, while maintaining heel/toe gait throughout - forward and backwards 3 x each  Ambulating to car from inside of clinic with varying terrain between sidewalk and mulch (approx 60 ft)  Discussed daily activity/ HEP   05/31/22:  No Nustep at this time.    There.ex.:   O2 sat: 95%/ HR 83 bpm.  Seated/ standing 5# LE ex. Program: LAQ/ marching/ heel and toe raises/ walking forward and backwards/ lateral 5 laps (no UE assist)- in //-bars.    Walking in clinic with increase hip flexion/ step pattern.  Cuing to correct posture.  Sit to stands from gray chair with no UE assist.   Neuro:  Standing ball toss: NBOS/ modified tandem gait L/R in //-bars with yellow ball.  No LOB  Walking in //-bars: recip. Steps over green hurdles/ cone taps.    Tandem Airex: forward/ lateral 3x in //-bars.  Pharmacist, hospital.     See evaluation  5xSTS: 12.66 seconds  Tandem gait requires light UE assist on R in //-bars.  O2 sat: 97%,  HR 63 bpm.  L/R LE muscle strength: hip flexion (5/5, 4/5 MMT), quad (5/5, 4+/5 MMT), hamstring (5/5,  4+/5 MMT).  Pt. Easily fatigues with activity and has increase SOB during standing tasks/ walking in //-bars.    FOTO: initial 33/ goal 50.    Reassessment of B shoulder AROM HEP.     Ther.ex.:   No Nustep today.  Walking in //-bars (forward/ lateral/backwards)- high marching with mirror feedback.  Moderate cuing to slow down and correct upright posture.    Walking with alt. UE/LE touches with consistent recip. Pattern.  Mirror feedback/ cuing to slow down.    Seated marching/ LAQ 20x.     Nautilus seated lat pull down 60#, standing tricep extension 40#, standing scap. Retraction 40# 15x2 each.        PATIENT EDUCATION: Education details: HEP/ daily activity Person educated: Patient Education method: Customer service manager Education comprehension: verbalized understanding and returned demonstration  HOME EXERCISE PROGRAM: Handouts given for shoulder ROM/ strengthening/ standing hip ex.    PT Short Term Goals -      PT SHORT TERM GOAL #1   Title Pt will be indep with HEP to improve strength and balance to reduce risk of falls.    Baseline 11/23: initiated    Time 4    Period Weeks    Status Achieved              PT Long Term Goals -      PT LONG TERM GOAL #1   Title Pt will improve FOTO score to 50 to display improvement in functional mobility.    Baseline 6/7: 33   Time 12   Period Weeks    Status Initial   Target Date 08/03/22      PT LONG TERM GOAL #2   Title Pt wil improve TUG score to < 12 sec with no AD to display significant imporvement in reduced risk of falls    Baseline 11/23: 26 sec (did not sit down immediately leading to higher time than he would've scored) 1/19: 14.44 sec.   6/7: 12.66 sec.    Time 12    Period Weeks    Status Partially Met    Target Date 08/03/22      PT LONG TERM GOAL #3   Title Pt will improve DGI to >19/24 to indicate decreased risk of falls with household and community walking tasks    Baseline 11/23:  11/24 1/19:12/24    Time 12    Period Weeks    Status Partially Met    Target Date 08/03/22      PT LONG TERM GOAL #4   Title Pt. Will increase R hip flexion/ LE muscle strength 1/2 muscle grade to improve standing tolerance/ walking endurance.    Baseline See above   Time 12    Period Weeks    Status Partially Met    Target Date 08/03/22              Plan -     Clinical Impression Statement Pt. Presents with moderate B lower leg swelling and continued R LE muscle weakness. Pt's balance was challenged when ambulating on uneven surfaces, and when transitioning from airex pad to wooden plinth. NO LOB during tx. Pt. Requires several short seated rest breaks during tx. Session due to fatigue with prolonged standing/walking tasks. Pt. Will continue to benefit from skilled PT services to increase B LE muscle strength to improve walking/ balance, overall functional mobility, and cardiovascular endurance.     Personal Factors and Comorbidities Comorbidity 2    Comorbidities Hypertension, Obesity    Examination-Activity Limitations Bed Mobility;Reach Overhead;Squat;Lift;Dressing;Hygiene/Grooming    Examination-Participation Restrictions Community Activity;Yard Work    [employer]chant [employer] officer Evolving/Moderate complexity    Clinical Decision Making Moderate    Rehab Potential Fair    PT Frequency 1x / week    PT Duration 12 weeks    PT Treatment/Interventions ADLs/Self Care Home Management;Gait training;Stair training;Functional mobility training;Therapeutic activities;Therapeutic exercise;Balance training;Neuromuscular re-education;Manual techniques;Patient/family education;Electrical Stimulation;Moist Heat;Cryotherapy    PT Next Visit Plan Ask about rectal bleeding and put pt on NuStep   PT Home Exercise Plan Shoulder pulleys in flex, scaption, abd, pendelums, scap retractions with resistance (RTB), D2 flexion pattern in sitting no resistance.    Consulted and Agree with Plan of  Care Patient             Andee Lineman, SPT Rebeca Alert  Clementeen Hoof, PT, DPT # [id number] 06/08/2022, 3:14 PM

## 2022-06-16 ENCOUNTER — Ambulatory Visit: Payer: Medicare Other

## 2022-06-16 DIAGNOSIS — R279 Unspecified lack of coordination: Secondary | ICD-10-CM

## 2022-06-16 DIAGNOSIS — R262 Difficulty in walking, not elsewhere classified: Secondary | ICD-10-CM

## 2022-06-16 DIAGNOSIS — R269 Unspecified abnormalities of gait and mobility: Secondary | ICD-10-CM | POA: Diagnosis not present

## 2022-06-16 DIAGNOSIS — R2689 Other abnormalities of gait and mobility: Secondary | ICD-10-CM | POA: Diagnosis not present

## 2022-06-16 DIAGNOSIS — M6281 Muscle weakness (generalized): Secondary | ICD-10-CM | POA: Diagnosis not present

## 2022-06-16 DIAGNOSIS — R2681 Unsteadiness on feet: Secondary | ICD-10-CM

## 2022-06-16 NOTE — Therapy (Signed)
OUTPATIENT PHYSICAL THERAPY TREATMENT   Patient Name: Tom Underwood MRN: [id number] DOB:1951/07/01, 71 y.o., male Today's Date: 06/16/2022  PCP: Steele Sizer, MD REFERRING PROVIDER: Myles Gip, DO   PT End of Session - 06/16/22 1347     Visit Number 6    Number of Visits 13    Date for PT Re-Evaluation 08/03/22    PT Start Time 1346    PT Stop Time 1425    PT Time Calculation (min) 39 min    Activity Tolerance Patient tolerated treatment well;Patient limited by fatigue    Behavior During Therapy WFL for tasks assessed/performed             Past Medical History:  Diagnosis Date   Anxiety    Arthritis    Atrial fibrillation ([place])    a.) CHA2DS2-VASc = 4 (age, HTN, CVA x2). b.) s/p 200 J synchronized cardioversion (DCCV) on 12/08/2021. c.) rate/rythum maintained on oral metoprolol tartrate; chronically anticoagulated using rivaroxaban.   BPH (benign prostatic hyperplasia)    Complication of anesthesia    a.)  Intolerant of propofol; causes "skin burning and involuntary muscle twitching"   DDD (degenerative disc disease), lumbar    Decreased libido    Depression    Diverticulitis    Diverticulosis    GERD (gastroesophageal reflux disease)    Hepatic steatosis    History of 2019 novel coronavirus disease (COVID-19) 12/28/2020   History of cardiac murmur in childhood    History of kidney stones    HLD (hyperlipidemia)    HLD (hyperlipidemia)    Hypertension    Hypogonadism in male    Long term current use of anticoagulant    a.) rivaroxaban   OSA on CPAP    Pre-diabetes    Rhinitis, allergic    Stroke ([place]) 07/23/2019   a.) LEFT posterior cor radiata. b.) residual RIGHT sided weakness   Past Surgical History:  Procedure Laterality Date   CARDIOVERSION N/A 12/08/2021   Procedure: CARDIOVERSION;  Surgeon: Nelva Bush, MD;  Location: [facility];  Service: Cardiovascular;  Laterality: N/A;   COLONOSCOPY     EVALUATION UNDER ANESTHESIA WITH  HEMORRHOIDECTOMY N/A 04/15/2022   Procedure: EXAM UNDER ANESTHESIA WITH HEMORRHOIDECTOMY;  Surgeon: Benjamine Sprague, DO;  Location: [facility];  Service: General;  Laterality: N/A;   EXTERNAL EAR SURGERY     EYE SURGERY     FINGER SURGERY Right    lateration to 5 th digit   KNEE SURGERY     left eye     RECTAL EXAM UNDER ANESTHESIA N/A 04/23/2022   Procedure: RECTAL EXAM UNDER ANESTHESIA WITH LIGATION OF BLEEDING;  Surgeon: Olean Ree, MD;  Location: [facility];  Service: General;  Laterality: N/A;   Patient Active Problem List   Diagnosis Date Noted   Gastroesophageal reflux disease without esophagitis 05/17/2022   Dysthymia 05/17/2022   History of syncope 05/17/2022   History of hemorrhoidectomy 05/17/2022   Vitamin D deficiency 05/17/2022   Lower extremity edema 05/17/2022   History of rectal bleeding 05/17/2022   Bleeding internal hemorrhoids    Morbid obesity ([place])    Persistent atrial fibrillation ([place]) 07/14/2020   Hypertension    Hemiparesis of right dominant side as late effect of cerebral infarction ([place]) 08/13/2019   History of kidney stones 05/02/2018   ED (erectile dysfunction) 05/02/2018   Hyperglycemia 01/30/2017   Arthritis of knee, degenerative 01/30/2017   Benign prostatic hyperplasia without lower urinary tract symptoms 02/03/2016   Dyslipidemia 08/04/2015  Diverticulitis of large intestine without perforation or abscess without bleeding 06/30/2015   OSA on CPAP 06/30/2015    REFERRING DIAG: M25.511,M25.512 (ICD-10-CM) - Bilateral shoulder pain, unspecified chronicity M17.2 (ICD-10-CM) - Post-traumatic osteoarthritis of both knees  R26.81 (ICD-10-CM) - Gait instability    THERAPY DIAG:  Difficulty in walking, not elsewhere classified  Muscle weakness (generalized)  Other abnormalities of gait and mobility  Unsteadiness on feet  Unspecified lack of coordination  Abnormality of gait and mobility  PERTINENT HISTORY: See evaluation  PRECAUTIONS:  Fall  SUBJECTIVE:  Pt reports having a harder time walking around house due to clutter in house. Reports feeling BLE's feel weaker than usual.   PAIN:  Are you having pain? Yes: NPRS scale: 7-8/10 Pain location: B knee Pain description: aching Aggravating factors: prolonged standing/ walking Relieving factors: rest    TODAY'S TREATMENT:   06/16/22:   Seated exercises:  Hip flexion from blue mat table: 2x25/LE. Limited foot clearance bilat despite VC's  LAQ's: 2x25/LE with VC's for attaining TKE in bilat knees   Hamstring curls with blue TB: 2x25. VC's for eccentric control.   // bar exercises:   Walking 6" step ups and overs with 5# AW's. Light UE supports on bars. SBA. 2x3 D/B with seated rest b/t sets.   Circuit (SBA throughout):    20 mini squats with light BUE support   Ambulating alternating marches in // bars x2 D/B    20 mini squats with light BUE support   PATIENT EDUCATION: Education details: HEP/ daily activity Person educated: Patient Education method: Customer service manager Education comprehension: verbalized understanding and returned demonstration   HOME EXERCISE PROGRAM: Handouts given for shoulder ROM/ strengthening/ standing hip ex.    PT Short Term Goals -      PT SHORT TERM GOAL #1   Title Pt will be indep with HEP to improve strength and balance to reduce risk of falls.    Baseline 11/23: initiated    Time 4    Period Weeks    Status Achieved              PT Long Term Goals -      PT LONG TERM GOAL #1   Title Pt will improve FOTO score to 50 to display improvement in functional mobility.    Baseline 6/7: 33   Time 12   Period Weeks    Status Initial   Target Date 08/03/22      PT LONG TERM GOAL #2   Title Pt wil improve TUG score to < 12 sec with no AD to display significant imporvement in reduced risk of falls    Baseline 11/23: 26 sec (did not sit down immediately leading to higher time than he would've scored) 1/19:  14.44 sec.   6/7: 12.66 sec.    Time 12    Period Weeks    Status Partially Met    Target Date 08/03/22      PT LONG TERM GOAL #3   Title Pt will improve DGI to >19/24 to indicate decreased risk of falls with household and community walking tasks    Baseline 11/23: 11/24 1/19:12/24    Time 12    Period Weeks    Status Partially Met    Target Date 08/03/22      PT LONG TERM GOAL #4   Title Pt. Will increase R hip flexion/ LE muscle strength 1/2 muscle grade to improve standing tolerance/ walking endurance.    Baseline See  above   Time 12    Period Weeks    Status Partially Met    Target Date 08/03/22              Plan -     Clinical Impression Statement Continuing PT POC with focus on LE strengthening. Frequent need for seated rest breaks needed between exercises due to SOB and weakness with standing exercises. Pt will continue to benefit from skilled PT services to progress strength and mobility.     Personal Factors and Comorbidities Comorbidity 2    Comorbidities Hypertension, Obesity    Examination-Activity Limitations Bed Mobility;Reach Overhead;Squat;Lift;Dressing;Hygiene/Grooming    Examination-Participation Restrictions Community Activity;Yard Work    Merchant [employer] officer Evolving/Moderate complexity    Clinical Decision Making Moderate    Rehab Potential Fair    PT Frequency 1x / week    PT Duration 12 weeks    PT Treatment/Interventions ADLs/Self Care Home Management;Gait training;Stair training;Functional mobility training;Therapeutic activities;Therapeutic exercise;Balance training;Neuromuscular re-education;Manual techniques;Patient/family education;Electrical Stimulation;Moist Heat;Cryotherapy    PT Next Visit Plan Ask about rectal bleeding and put pt on NuStep   PT Home Exercise Plan Shoulder pulleys in flex, scaption, abd, pendelums, scap retractions with resistance (RTB), D2 flexion pattern in sitting no resistance.    Consulted and Agree with  Plan of Care Patient            This entire session was performed under direct supervision and direction of a licensed therapist. I have personally read, edited and approve of the note as written.  Andee Lineman, SPT  McKinney Fairly IV, PT, DPT Physical Therapist- [facility]  06/16/2022, 2:26 PM

## 2022-06-21 ENCOUNTER — Other Ambulatory Visit: Payer: Self-pay | Admitting: Family Medicine

## 2022-06-21 DIAGNOSIS — F419 Anxiety disorder, unspecified: Secondary | ICD-10-CM

## 2022-06-21 DIAGNOSIS — F341 Dysthymic disorder: Secondary | ICD-10-CM

## 2022-06-21 NOTE — Telephone Encounter (Signed)
Medication Refill - Medication: busPIRone (BUSPAR) 10 MG tablet Pt stated he takes Takes 1 tablet by mouth 2 (two) times daily and 100 tablets will not last him for 3 months.    I advised pt I was sending the refill request to PCP.   Has the patient contacted their pharmacy? Yes.   No, more refills.  (Agent: If no, request that the patient contact the pharmacy for the refill. If patient does not wish to contact the pharmacy document the reason why and proceed with request.)   Preferred Pharmacy (with phone number or street name):  [id number] [place], [place] - [address] AT [facility] OF [address] [place]  [address] [place] [address]  Phone: [phone] Fax: [phone]  Hours: Not open 24 hours   Has the patient been seen for an appointment in the last year OR does the patient have an upcoming appointment? Yes.    Agent: Please be advised that RX refills may take up to 3 business days. We ask that you follow-up with your pharmacy.

## 2022-06-22 MED ORDER — BUSPIRONE HCL 10 MG PO TABS: 10.0000 mg | ORAL_TABLET | Freq: Two times a day (BID) | ORAL | 1 refills | Status: DC

## 2022-06-22 NOTE — Telephone Encounter (Signed)
Requested Prescriptions  Pending Prescriptions Disp Refills  . busPIRone (BUSPAR) 10 MG tablet 180 tablet 1    Sig: Take 1 tablet (10 mg total) by mouth 2 (two) times daily.     Psychiatry: Anxiolytics/Hypnotics - Non-controlled Passed - 06/21/2022  5:26 PM      Passed - Valid encounter within last 12 months    Recent Outpatient Visits          1 month ago Hemiparesis of right dominant side as late effect of cerebral infarction [facility])   [facility] Tom Cory, Tom Underwood   3 months ago Upper respiratory tract infection, unspecified type   [facility] Tom Mail, DO   7 months ago Gait instability   [facility] Tom Cory, Tom Underwood   8 months ago Need for influenza vaccination   [facility] Tom Laroche, DO   1 year ago Acute diverticulitis   [facility], Tom Underwood      Future Appointments            In 1 week McG[place], Elana [facility] [place]   In 1 month Debbe Odea, Tom Underwood [facility]   In 4 months Tom Cory, Tom Underwood [facility], [facility]

## 2022-06-23 ENCOUNTER — Ambulatory Visit: Payer: Medicare Other

## 2022-06-23 DIAGNOSIS — R262 Difficulty in walking, not elsewhere classified: Secondary | ICD-10-CM | POA: Diagnosis not present

## 2022-06-23 DIAGNOSIS — R2681 Unsteadiness on feet: Secondary | ICD-10-CM

## 2022-06-23 DIAGNOSIS — M6281 Muscle weakness (generalized): Secondary | ICD-10-CM

## 2022-06-23 DIAGNOSIS — R2689 Other abnormalities of gait and mobility: Secondary | ICD-10-CM

## 2022-06-23 DIAGNOSIS — H35372 Puckering of macula, left eye: Secondary | ICD-10-CM | POA: Diagnosis not present

## 2022-06-23 DIAGNOSIS — R269 Unspecified abnormalities of gait and mobility: Secondary | ICD-10-CM

## 2022-06-23 DIAGNOSIS — R279 Unspecified lack of coordination: Secondary | ICD-10-CM

## 2022-06-23 DIAGNOSIS — H2511 Age-related nuclear cataract, right eye: Secondary | ICD-10-CM | POA: Diagnosis not present

## 2022-06-23 DIAGNOSIS — H25042 Posterior subcapsular polar age-related cataract, left eye: Secondary | ICD-10-CM | POA: Diagnosis not present

## 2022-06-23 NOTE — Therapy (Addendum)
OUTPATIENT PHYSICAL THERAPY TREATMENT   Patient Name: Tom Underwood MRN: [id number] DOB:11/06/1951, 71 y.o., male Today's Date: 06/23/2022  PCP: Steele Sizer, MD REFERRING PROVIDER: Myles Gip, DO   PT End of Session - 06/23/22 1706     Visit Number 7    Number of Visits 13    Date for PT Re-Evaluation 08/03/22    PT Start Time 1303    PT Stop Time 1344    PT Time Calculation (min) 41 min    Activity Tolerance Patient tolerated treatment well;Patient limited by fatigue    Behavior During Therapy [facility] for tasks assessed/performed              Past Medical History:  Diagnosis Date   Anxiety    Arthritis    Atrial fibrillation ([place])    a.) CHA2DS2-VASc = 4 (age, HTN, CVA x2). b.) s/p 200 J synchronized cardioversion (DCCV) on 12/08/2021. c.) rate/rythum maintained on oral metoprolol tartrate; chronically anticoagulated using rivaroxaban.   BPH (benign prostatic hyperplasia)    Complication of anesthesia    a.)  Intolerant of propofol; causes "skin burning and involuntary muscle twitching"   DDD (degenerative disc disease), lumbar    Decreased libido    Depression    Diverticulitis    Diverticulosis    GERD (gastroesophageal reflux disease)    Hepatic steatosis    History of 2019 novel coronavirus disease (COVID-19) 12/28/2020   History of cardiac murmur in childhood    History of kidney stones    HLD (hyperlipidemia)    HLD (hyperlipidemia)    Hypertension    Hypogonadism in male    Long term current use of anticoagulant    a.) rivaroxaban   OSA on CPAP    Pre-diabetes    Rhinitis, allergic    Stroke ([place]) 07/23/2019   a.) LEFT posterior cor radiata. b.) residual RIGHT sided weakness   Past Surgical History:  Procedure Laterality Date   CARDIOVERSION N/A 12/08/2021   Procedure: CARDIOVERSION;  Surgeon: Nelva Bush, MD;  Location: [facility] ORS;  Service: Cardiovascular;  Laterality: N/A;   COLONOSCOPY     EVALUATION UNDER ANESTHESIA WITH  HEMORRHOIDECTOMY N/A 04/15/2022   Procedure: EXAM UNDER ANESTHESIA WITH HEMORRHOIDECTOMY;  Surgeon: Benjamine Sprague, DO;  Location: [facility] ORS;  Service: General;  Laterality: N/A;   EXTERNAL EAR SURGERY     EYE SURGERY     FINGER SURGERY Right    lateration to 5 th digit   KNEE SURGERY     left eye     RECTAL EXAM UNDER ANESTHESIA N/A 04/23/2022   Procedure: RECTAL EXAM UNDER ANESTHESIA WITH LIGATION OF BLEEDING;  Surgeon: Olean Ree, MD;  Location: [facility] ORS;  Service: General;  Laterality: N/A;   Patient Active Problem List   Diagnosis Date Noted   Gastroesophageal reflux disease without esophagitis 05/17/2022   Dysthymia 05/17/2022   History of syncope 05/17/2022   History of hemorrhoidectomy 05/17/2022   Vitamin D deficiency 05/17/2022   Lower extremity edema 05/17/2022   History of rectal bleeding 05/17/2022   Bleeding internal hemorrhoids    Morbid obesity ([place])    Persistent atrial fibrillation ([place]) 07/14/2020   Hypertension    Hemiparesis of right dominant side as late effect of cerebral infarction ([place]) 08/13/2019   History of kidney stones 05/02/2018   ED (erectile dysfunction) 05/02/2018   Hyperglycemia 01/30/2017   Arthritis of knee, degenerative 01/30/2017   Benign prostatic hyperplasia without lower urinary tract symptoms 02/03/2016   Dyslipidemia 08/04/2015  Diverticulitis of large intestine without perforation or abscess without bleeding 06/30/2015   OSA on CPAP 06/30/2015    REFERRING DIAG: M25.511,M25.512 (ICD-10-CM) - Bilateral shoulder pain, unspecified chronicity M17.2 (ICD-10-CM) - Post-traumatic osteoarthritis of both knees  R26.81 (ICD-10-CM) - Gait instability    THERAPY DIAG:  Difficulty in walking, not elsewhere classified  Muscle weakness (generalized)  Other abnormalities of gait and mobility  Unsteadiness on feet  Abnormality of gait and mobility  Unspecified lack of coordination  PERTINENT HISTORY: See evaluation  PRECAUTIONS:  Fall  SUBJECTIVE:  Pt reports 11/10 pain in his knees, and is unable to do ambulation interventions today. Pt has been active this morning, and is now extremely sore. Pt walked 200 yards at the eye doctor this morning, and reported being extremely fatigued and exhausted.    PAIN:  Are you having pain? Yes: NPRS scale: 11/10 Pain location: B knee Pain description: aching Aggravating factors: prolonged standing/ walking Relieving factors: rest    TODAY'S TREATMENT:   06/23/22: UBE 2 min 30 s forward, 2 min 30 s reverse Pt stated that he is no longer bleeding, and would like to use the bike next tx.  Seated exercises: Hamstring curls with blue TB: 2x25. VC's for eccentric control LAQ with blue TB 2x25 - VC to slow down and control motion  Supine exercises: Supine Marches on blue mat - 2 x 25   PATIENT EDUCATION: Education details: HEP/ daily activity Person educated: Patient Education method: Customer service manager Education comprehension: verbalized understanding and returned demonstration   HOME EXERCISE PROGRAM: Handouts given for shoulder ROM/ strengthening/ standing hip ex.    PT Short Term Goals -      PT SHORT TERM GOAL #1   Title Pt will be indep with HEP to improve strength and balance to reduce risk of falls.    Baseline 11/23: initiated    Time 4    Period Weeks    Status Achieved              PT Long Term Goals -      PT LONG TERM GOAL #1   Title Pt will improve FOTO score to 50 to display improvement in functional mobility.    Baseline 6/7: 33   Time 12   Period Weeks    Status Initial   Target Date 08/03/22      PT LONG TERM GOAL #2   Title Pt wil improve TUG score to < 12 sec with no AD to display significant imporvement in reduced risk of falls    Baseline 11/23: 26 sec (did not sit down immediately leading to higher time than he would've scored) 1/19: 14.44 sec.   6/7: 12.66 sec.    Time 12    Period Weeks    Status Partially  Met    Target Date 08/03/22      PT LONG TERM GOAL #3   Title Pt will improve DGI to >19/24 to indicate decreased risk of falls with household and community walking tasks    Baseline 11/23: 11/24 1/19:12/24    Time 12    Period Weeks    Status Partially Met    Target Date 08/03/22      PT LONG TERM GOAL #4   Title Pt. Will increase R hip flexion/ LE muscle strength 1/2 muscle grade to improve standing tolerance/ walking endurance.    Baseline See above   Time 12    Period Weeks    Status Partially Met  Target Date 08/03/22              Plan -     Clinical Impression Statement Continuing POC with focus on LE strengthening and cardiovascular endurance. Pt was unable to perform tx on his feet today due to knee pain, and performed seated and supine interventions. Pt will continue to benefit from skilled PT services to progress strength and mobility, along with cardiovascular endurance.    Personal Factors and Comorbidities Comorbidity 2    Comorbidities Hypertension, Obesity    Examination-Activity Limitations Bed Mobility;Reach Overhead;Squat;Lift;Dressing;Hygiene/Grooming    Examination-Participation Restrictions Community Activity;Yard Work    [employer]chant [employer] officer Evolving/Moderate complexity    Clinical Decision Making Moderate    Rehab Potential Fair    PT Frequency 1x / week    PT Duration 12 weeks    PT Treatment/Interventions ADLs/Self Care Home Management;Gait training;Stair training;Functional mobility training;Therapeutic activities;Therapeutic exercise;Balance training;Neuromuscular re-education;Manual techniques;Patient/family education;Electrical Stimulation;Moist Heat;Cryotherapy    PT Next Visit Plan Ask about rectal bleeding and put pt on NuStep. Cardiovascular endurance.    PT Home Exercise Plan Shoulder pulleys in flex, scaption, abd, pendelums, scap retractions with resistance (RTB), D2 flexion pattern in sitting no resistance.    Consulted and  Agree with Plan of Care Patient             Andee Lineman, SPT  Seymour Fairly IV, PT, DPT Physical Therapist- [facility]  06/23/2022, 5:11 PM

## 2022-06-29 ENCOUNTER — Ambulatory Visit: Payer: Medicare Other | Admitting: Physical Therapy

## 2022-06-29 ENCOUNTER — Encounter: Payer: Self-pay | Admitting: Physical Therapy

## 2022-06-29 DIAGNOSIS — R262 Difficulty in walking, not elsewhere classified: Secondary | ICD-10-CM | POA: Diagnosis not present

## 2022-06-29 DIAGNOSIS — R279 Unspecified lack of coordination: Secondary | ICD-10-CM

## 2022-06-29 DIAGNOSIS — R2681 Unsteadiness on feet: Secondary | ICD-10-CM | POA: Diagnosis not present

## 2022-06-29 DIAGNOSIS — R2689 Other abnormalities of gait and mobility: Secondary | ICD-10-CM

## 2022-06-29 DIAGNOSIS — R269 Unspecified abnormalities of gait and mobility: Secondary | ICD-10-CM | POA: Diagnosis not present

## 2022-06-29 DIAGNOSIS — M6281 Muscle weakness (generalized): Secondary | ICD-10-CM | POA: Diagnosis not present

## 2022-06-29 NOTE — Therapy (Signed)
OUTPATIENT PHYSICAL THERAPY TREATMENT   Patient Name: Tom Underwood MRN: [id number] DOB:1951-06-27, 71 y.o., male Today's Date: 06/29/2022  PCP: Steele Sizer, MD REFERRING PROVIDER: Myles Gip, DO   PT End of Session - 06/29/22 1318     Visit Number 8    Number of Visits 13    Date for PT Re-Evaluation 08/03/22    PT Start Time 1257    PT Stop Time 1354    PT Time Calculation (min) 57 min    Activity Tolerance Patient tolerated treatment well;Patient limited by fatigue    Behavior During Therapy [facility] for tasks assessed/performed              Past Medical History:  Diagnosis Date   Anxiety    Arthritis    Atrial fibrillation ([place])    a.) CHA2DS2-VASc = 4 (age, HTN, CVA x2). b.) s/p 200 J synchronized cardioversion (DCCV) on 12/08/2021. c.) rate/rythum maintained on oral metoprolol tartrate; chronically anticoagulated using rivaroxaban.   BPH (benign prostatic hyperplasia)    Complication of anesthesia    a.)  Intolerant of propofol; causes "skin burning and involuntary muscle twitching"   DDD (degenerative disc disease), lumbar    Decreased libido    Depression    Diverticulitis    Diverticulosis    GERD (gastroesophageal reflux disease)    Hepatic steatosis    History of 2019 novel coronavirus disease (COVID-19) 12/28/2020   History of cardiac murmur in childhood    History of kidney stones    HLD (hyperlipidemia)    HLD (hyperlipidemia)    Hypertension    Hypogonadism in male    Long term current use of anticoagulant    a.) rivaroxaban   OSA on CPAP    Pre-diabetes    Rhinitis, allergic    Stroke ([place]) 07/23/2019   a.) LEFT posterior cor radiata. b.) residual RIGHT sided weakness   Past Surgical History:  Procedure Laterality Date   CARDIOVERSION N/A 12/08/2021   Procedure: CARDIOVERSION;  Surgeon: Nelva Bush, MD;  Location: [facility] ORS;  Service: Cardiovascular;  Laterality: N/A;   COLONOSCOPY     EVALUATION UNDER ANESTHESIA WITH  HEMORRHOIDECTOMY N/A 04/15/2022   Procedure: EXAM UNDER ANESTHESIA WITH HEMORRHOIDECTOMY;  Surgeon: Benjamine Sprague, DO;  Location: [facility] ORS;  Service: General;  Laterality: N/A;   EXTERNAL EAR SURGERY     EYE SURGERY     FINGER SURGERY Right    lateration to 5 th digit   KNEE SURGERY     left eye     RECTAL EXAM UNDER ANESTHESIA N/A 04/23/2022   Procedure: RECTAL EXAM UNDER ANESTHESIA WITH LIGATION OF BLEEDING;  Surgeon: Olean Ree, MD;  Location: [facility] ORS;  Service: General;  Laterality: N/A;   Patient Active Problem List   Diagnosis Date Noted   Gastroesophageal reflux disease without esophagitis 05/17/2022   Dysthymia 05/17/2022   History of syncope 05/17/2022   History of hemorrhoidectomy 05/17/2022   Vitamin D deficiency 05/17/2022   Lower extremity edema 05/17/2022   History of rectal bleeding 05/17/2022   Bleeding internal hemorrhoids    Morbid obesity ([place])    Persistent atrial fibrillation ([place]) 07/14/2020   Hypertension    Hemiparesis of right dominant side as late effect of cerebral infarction ([place]) 08/13/2019   History of kidney stones 05/02/2018   ED (erectile dysfunction) 05/02/2018   Hyperglycemia 01/30/2017   Arthritis of knee, degenerative 01/30/2017   Benign prostatic hyperplasia without lower urinary tract symptoms 02/03/2016   Dyslipidemia 08/04/2015  Diverticulitis of large intestine without perforation or abscess without bleeding 06/30/2015   OSA on CPAP 06/30/2015    REFERRING DIAG: M25.511,M25.512 (ICD-10-CM) - Bilateral shoulder pain, unspecified chronicity M17.2 (ICD-10-CM) - Post-traumatic osteoarthritis of both knees  R26.81 (ICD-10-CM) - Gait instability    THERAPY DIAG:  Difficulty in walking, not elsewhere classified  Muscle weakness (generalized)  Other abnormalities of gait and mobility  Unsteadiness on feet  Abnormality of gait and mobility  Unspecified lack of coordination  PERTINENT HISTORY: See evaluation  PRECAUTIONS:  Fall  SUBJECTIVE:  Pt reports 11/10 pain in his knees.  Pt. Continues to have persistent swelling in B lower legs (R>L).  Moderate pitting edema present.  Pt. Reports he had difficulty walking yesterday after standing from chair.  Pt. States legs feel really heavy.  Pt. States movement/ exercise and walking helped some yesterday.  Pt. is also concerned he may have a kidney stone.    PAIN:  Are you having pain? Yes: NPRS scale: 11/10 Pain location: B knee Pain description: aching Aggravating factors: prolonged standing/ walking Relieving factors: rest    TODAY'S TREATMENT:   06/29/22:  Nustep L4 10 min. B UE/LE.    High marching/ lateral walking in //-bars 5 laps.  Standing heel/toe raises/ hamstring curls 20x.  (Seated rest break requested/ increase SOB)- HR 66, O2 sat. 96%.   Walking in //-bars with 3" and 6" hurdles clearance (recip. Pattern) with no UE assist but cautious with step pattern.    Walking in clinic/ hallway with head turns/ cuing to increase BOS/ heel strike.  Walking outside down ramp while carrying SPC/ small bag.    Discussed daily HEP/ walking at home.      PATIENT EDUCATION: Education details: HEP/ daily activity Person educated: Patient Education method: Customer service manager Education comprehension: verbalized understanding and returned demonstration   HOME EXERCISE PROGRAM: Handouts given for shoulder ROM/ strengthening/ standing hip ex.    PT Short Term Goals -      PT SHORT TERM GOAL #1   Title Pt will be indep with HEP to improve strength and balance to reduce risk of falls.    Baseline 11/23: initiated    Time 4    Period Weeks    Status Achieved              PT Long Term Goals -      PT LONG TERM GOAL #1   Title Pt will improve FOTO score to 50 to display improvement in functional mobility.    Baseline 6/7: 33   Time 12   Period Weeks    Status Initial   Target Date 08/03/22      PT LONG TERM GOAL #2   Title Pt  wil improve TUG score to < 12 sec with no AD to display significant imporvement in reduced risk of falls    Baseline 11/23: 26 sec (did not sit down immediately leading to higher time than he would've scored) 1/19: 14.44 sec.   6/7: 12.66 sec.    Time 12    Period Weeks    Status Partially Met    Target Date 08/03/22      PT LONG TERM GOAL #3   Title Pt will improve DGI to >19/24 to indicate decreased risk of falls with household and community walking tasks    Baseline 11/23: 11/24 1/19:12/24    Time 12    Period Weeks    Status Partially Met    Target Date 08/03/22  PT LONG TERM GOAL #4   Title Pt. Will increase R hip flexion/ LE muscle strength 1/2 muscle grade to improve standing tolerance/ walking endurance.    Baseline See above   Time 12    Period Weeks    Status Partially Met    Target Date 08/03/22              Plan -     Clinical Impression Statement Continuing POC with focus on LE strengthening and cardiovascular endurance. Pt. Returned to using Nustep today since having surgical procedure.  Pt. Has significant swelling in B lower legs and pitting edema.  Pt will continue to benefit from skilled PT services to progress strength and mobility, along with cardiovascular endurance.    Personal Factors and Comorbidities Comorbidity 2    Comorbidities Hypertension, Obesity    Examination-Activity Limitations Bed Mobility;Reach Overhead;Squat;Lift;Dressing;Hygiene/Grooming    Examination-Participation Restrictions Community Activity;Yard Work    [employer]chant [employer] officer Evolving/Moderate complexity    Clinical Decision Making Moderate    Rehab Potential Fair    PT Frequency 1x / week    PT Duration 12 weeks    PT Treatment/Interventions ADLs/Self Care Home Management;Gait training;Stair training;Functional mobility training;Therapeutic activities;Therapeutic exercise;Balance training;Neuromuscular re-education;Manual techniques;Patient/family  education;Electrical Stimulation;Moist Heat;Cryotherapy    PT Next Visit Plan LE muscle strengthening/ endurance training.    PT Home Exercise Plan Shoulder pulleys in flex, scaption, abd, pendelums, scap retractions with resistance (RTB), D2 flexion pattern in sitting no resistance.    Consulted and Agree with Plan of Care Patient            Pura Spice, PT, DPT # [phone] 06/29/2022, 2:01 PM

## 2022-07-01 ENCOUNTER — Other Ambulatory Visit: Payer: Self-pay

## 2022-07-01 DIAGNOSIS — N400 Enlarged prostate without lower urinary tract symptoms: Secondary | ICD-10-CM

## 2022-07-01 DIAGNOSIS — Z8719 Personal history of other diseases of the digestive system: Secondary | ICD-10-CM | POA: Diagnosis not present

## 2022-07-01 DIAGNOSIS — I69351 Hemiplegia and hemiparesis following cerebral infarction affecting right dominant side: Secondary | ICD-10-CM | POA: Diagnosis not present

## 2022-07-01 DIAGNOSIS — R739 Hyperglycemia, unspecified: Secondary | ICD-10-CM | POA: Diagnosis not present

## 2022-07-02 LAB — CBC WITH DIFFERENTIAL/PLATELET
Basophils Absolute: 0 10*3/uL (ref 0.0–0.2)
Basos: 0 %
EOS (ABSOLUTE): 0.2 10*3/uL (ref 0.0–0.4)
Eos: 3 %
Hematocrit: 42.1 % (ref 37.5–51.0)
Hemoglobin: 14.4 g/dL (ref 13.0–17.7)
Immature Grans (Abs): 0 10*3/uL (ref 0.0–0.1)
Immature Granulocytes: 0 %
Lymphocytes Absolute: 2 10*3/uL (ref 0.7–3.1)
Lymphs: 38 %
MCH: 30.3 pg (ref 26.6–33.0)
MCHC: 34.2 g/dL (ref 31.5–35.7)
MCV: 89 fL (ref 79–97)
Monocytes Absolute: 0.4 10*3/uL (ref 0.1–0.9)
Monocytes: 9 %
Neutrophils Absolute: 2.6 10*3/uL (ref 1.4–7.0)
Neutrophils: 50 %
Platelets: 219 10*3/uL (ref 150–450)
RBC: 4.75 x10E6/uL (ref 4.14–5.80)
RDW: 12 % (ref 11.6–15.4)
WBC: 5.2 10*3/uL (ref 3.4–10.8)

## 2022-07-02 LAB — IRON,TIBC AND FERRITIN PANEL
Ferritin: 33 ng/mL (ref 30–400)
Iron Saturation: 19 % (ref 15–55)
Iron: 65 ug/dL (ref 38–169)
Total Iron Binding Capacity: 343 ug/dL (ref 250–450)
UIBC: 278 ug/dL (ref 111–343)

## 2022-07-02 LAB — LIPID PANEL
Chol/HDL Ratio: 2.4 ratio (ref 0.0–5.0)
Cholesterol, Total: 116 mg/dL (ref 100–199)
HDL: 48 mg/dL (ref >39)
LDL Chol Calc (NIH): 55 mg/dL (ref 0–99)
Triglycerides: 59 mg/dL (ref 0–149)
VLDL Cholesterol Cal: 13 mg/dL (ref 5–40)

## 2022-07-02 LAB — PSA: Prostate Specific Ag, Serum: 0.3 ng/mL (ref 0.0–4.0)

## 2022-07-02 LAB — HEMOGLOBIN A1C
Est. average glucose Bld gHb Est-mCnc: 103 mg/dL
Hgb A1c MFr Bld: 5.2 % (ref 4.8–5.6)

## 2022-07-04 ENCOUNTER — Other Ambulatory Visit: Payer: Self-pay

## 2022-07-04 ENCOUNTER — Telehealth: Payer: Self-pay | Admitting: Urology

## 2022-07-04 ENCOUNTER — Other Ambulatory Visit
Admission: RE | Admit: 2022-07-04 | Discharge: 2022-07-04 | Disposition: A | Discharge status: Home / Self Care | Payer: Medicare Other | Source: Home / Self Care | Attending: Urology | Admitting: Urology

## 2022-07-04 ENCOUNTER — Ambulatory Visit
Admission: RE | Admit: 2022-07-04 | Discharge: 2022-07-04 | Disposition: A | Discharge status: Home / Self Care | Payer: Medicare Other | Source: Ambulatory Visit | Attending: Urology | Admitting: Urology

## 2022-07-04 ENCOUNTER — Ambulatory Visit (INDEPENDENT_AMBULATORY_CARE_PROVIDER_SITE_OTHER): Payer: Medicare Other | Admitting: Urology

## 2022-07-04 ENCOUNTER — Telehealth: Payer: Self-pay

## 2022-07-04 ENCOUNTER — Ambulatory Visit
Admission: RE | Admit: 2022-07-04 | Discharge: 2022-07-04 | Disposition: A | Discharge status: Home / Self Care | Payer: Medicare Other | Attending: Urology | Admitting: Urology

## 2022-07-04 ENCOUNTER — Encounter: Payer: Self-pay | Admitting: Urology

## 2022-07-04 VITALS — Ht 70.0 in | Wt 297.6 lb

## 2022-07-04 DIAGNOSIS — Z87442 Personal history of urinary calculi: Secondary | ICD-10-CM | POA: Diagnosis not present

## 2022-07-04 DIAGNOSIS — R339 Retention of urine, unspecified: Secondary | ICD-10-CM

## 2022-07-04 DIAGNOSIS — N400 Enlarged prostate without lower urinary tract symptoms: Secondary | ICD-10-CM | POA: Insufficient documentation

## 2022-07-04 DIAGNOSIS — R109 Unspecified abdominal pain: Secondary | ICD-10-CM | POA: Diagnosis not present

## 2022-07-04 DIAGNOSIS — N200 Calculus of kidney: Secondary | ICD-10-CM | POA: Diagnosis not present

## 2022-07-04 DIAGNOSIS — M419 Scoliosis, unspecified: Secondary | ICD-10-CM | POA: Diagnosis not present

## 2022-07-04 LAB — URINALYSIS, COMPLETE (UACMP) WITH MICROSCOPIC
Bilirubin Urine: NEGATIVE
Glucose, UA: NEGATIVE mg/dL
Ketones, ur: NEGATIVE mg/dL
Leukocytes,Ua: NEGATIVE
Nitrite: NEGATIVE
Protein, ur: NEGATIVE mg/dL
Specific Gravity, Urine: 1.015 (ref 1.005–1.030)
pH: 6 (ref 5.0–8.0)

## 2022-07-04 NOTE — Progress Notes (Signed)
07/04/22 4:16 PM   Tom Underwood [birth year] [id number]  Referring provider:  Alba Cory, MD [address] [place],  [place] [address]  Chief Complaint  Patient presents with   Benign Prostatic Hypertrophy    Urological history  1. High risk hematuria -former smoker -CTU 2017 NED -cysto 2017 NED -no reports of gross hematuria   2. Nephrolithiasis -spontaneous passage of a left UPJ stone in 2014 -1 mm stone in the left kidney 2017 CTU   3. BPH with LU TS -PSA 0.3 07/01/2022 -I PSS 7/3   4. ED -contributing factors of age, BPH, HTN, HLD, stroke and sleep apnea   HPI: Tom Underwood is a 71 y.o.male who presents today for a 1 year follow-up with PSA, IPSS, and exam.   He reports that flomax helps with his urinary hesitancy when he can remember to take it. He does not void during the nighttime. He has an increase in frequency when he does not take flomax.   He also has been experiencing flank pain that started two days ago.  He feels that this is reminiscence of previous stones.    Patient denies any modifying or aggravating factors.  Patient denies any gross hematuria, dysuria or suprapubic.  Patient denies any fevers, chills, nausea or vomiting.   UA bland.  KUB no definite radiopaque stones.    IPSS     Row Name 07/04/22 1100         International Prostate Symptom Score   How often have you had the sensation of not emptying your bladder? Less than half the time     How often have you had to urinate less than every two hours? Less than half the time     How often have you found you stopped and started again several times when you urinated? Less than 1 in 5 times     How often have you found it difficult to postpone urination? Less than 1 in 5 times     How often have you had a weak urinary stream? Less than 1 in 5 times     How often have you had to strain to start urination? Not at All     How many times did you typically get up at night  to urinate? None     Total IPSS Score 7       Quality of Life due to urinary symptoms   If you were to spend the rest of your life with your urinary condition just the way it is now how would you feel about that? Mixed              Score:  1-7 Mild 8-19 Moderate 20-35 Severe    PMH: Past Medical History:  Diagnosis Date   Anxiety    Arthritis    Atrial fibrillation (HCC)    a.) CHA2DS2-VASc = 4 (age, HTN, CVA x2). b.) s/p 200 J synchronized cardioversion (DCCV) on 12/08/2021. c.) rate/rythum maintained on oral metoprolol tartrate; chronically anticoagulated using rivaroxaban.   BPH (benign prostatic hyperplasia)    Complication of anesthesia    a.)  Intolerant of propofol; causes "skin burning and involuntary muscle twitching"   DDD (degenerative disc disease), lumbar    Decreased libido    Depression    Diverticulitis    Diverticulosis    GERD (gastroesophageal reflux disease)    Hepatic steatosis    History of 2019 novel coronavirus disease (COVID-19) 12/28/2020   History of cardiac  murmur in childhood    History of kidney stones    HLD (hyperlipidemia)    HLD (hyperlipidemia)    Hypertension    Hypogonadism in male    Long term current use of anticoagulant    a.) rivaroxaban   OSA on CPAP    Pre-diabetes    Rhinitis, allergic    Stroke ([place]) 07/23/2019   a.) LEFT posterior cor radiata. b.) residual RIGHT sided weakness    Surgical History: Past Surgical History:  Procedure Laterality Date   CARDIOVERSION N/A 12/08/2021   Procedure: CARDIOVERSION;  Surgeon: Nelva Bush, MD;  Location: [facility] ORS;  Service: Cardiovascular;  Laterality: N/A;   COLONOSCOPY     EVALUATION UNDER ANESTHESIA WITH HEMORRHOIDECTOMY N/A 04/15/2022   Procedure: EXAM UNDER ANESTHESIA WITH HEMORRHOIDECTOMY;  Surgeon: Benjamine Sprague, DO;  Location: [facility] ORS;  Service: General;  Laterality: N/A;   EXTERNAL EAR SURGERY     EYE SURGERY     FINGER SURGERY Right    lateration to 5 th  digit   KNEE SURGERY     left eye     RECTAL EXAM UNDER ANESTHESIA N/A 04/23/2022   Procedure: RECTAL EXAM UNDER ANESTHESIA WITH LIGATION OF BLEEDING;  Surgeon: Olean Ree, MD;  Location: [facility] ORS;  Service: General;  Laterality: N/A;    Home Medications:  Allergies as of 07/04/2022       Reactions   Penicillins Itching        Medication List        Accurate as of July 04, 2022  4:16 PM. If you have any questions, ask your nurse or doctor.          amitriptyline 25 MG tablet Commonly known as: ELAVIL TAKE 1 TABLET BY MOUTH AT BEDTIME AS NEEDED FOR SLEEP What changed:  reasons to take this additional instructions   buPROPion 150 MG 24 hr tablet Commonly known as: WELLBUTRIN XL Take 1 tablet (150 mg total) by mouth every morning.   busPIRone 10 MG tablet Commonly known as: BUSPAR Take 1 tablet (10 mg total) by mouth 2 (two) times daily.   cetirizine 10 MG tablet Commonly known as: ZYRTEC Take 10 mg by mouth in the morning.   docusate sodium 100 MG capsule Commonly known as: COLACE Take 100 mg by mouth daily.   fluticasone 50 MCG/ACT nasal spray Commonly known as: FLONASE Place 2 sprays into both nostrils daily.   furosemide 20 MG tablet Commonly known as: LASIX Take 1 tablet (20 mg total) by mouth as needed. For lower extremity swelling.   GLUCOSAMINE-CHONDROITIN PO Take 2 tablets by mouth in the morning and at bedtime.   lidocaine 5 % ointment Commonly known as: XYLOCAINE Apply 1 application. topically 3 (three) times daily as needed. Apply pea size amount to area as needed   methocarbamol 500 MG tablet Commonly known as: ROBAXIN Take 500 mg by mouth every 8 (eight) hours as needed for muscle spasms.   metoprolol tartrate 50 MG tablet Commonly known as: LOPRESSOR Take 0.5 tablets (25 mg total) by mouth 2 (two) times daily.   multivitamin tablet Take 1 tablet by mouth daily.   omega-3 acid ethyl esters 1 g capsule Commonly known as:  LOVAZA Take 2 capsules (2 g total) by mouth 2 (two) times daily.   pantoprazole 20 MG tablet Commonly known as: PROTONIX Take 1 tablet (20 mg total) by mouth daily.   Probiotic 250 MG Caps Take 1 capsule by mouth daily.   rosuvastatin 20 MG tablet  Commonly known as: CRESTOR Take 1 tablet (20 mg total) by mouth daily. What changed: when to take this   sildenafil 50 MG tablet Commonly known as: VIAGRA   tamsulosin 0.4 MG Caps capsule Commonly known as: FLOMAX Take 1 capsule (0.4 mg total) by mouth daily.   valsartan 80 MG tablet Commonly known as: DIOVAN Take 80 mg by mouth daily.   Visine Dry Eye Relief 1 % Soln Generic drug: Polyethylene Glycol 400 Place 1 drop into both eyes daily as needed (dry eyes).   Vitamin D 125 MCG (5000 UT) Caps Take 5,000 Units by mouth daily.   Xarelto 20 MG Tabs tablet Generic drug: rivaroxaban Take 20 mg by mouth daily with supper.        Allergies:  Allergies  Allergen Reactions   Penicillins Itching    Family History: Family History  Problem Relation Age of Onset   Asthma Mother    Heart disease Mother    Heart attack Mother    Dementia Mother    Asthma Father    Heart disease Father    Stroke Father     Social History:  reports that he quit smoking about 48 years ago. His smoking use included pipe. He started smoking about 49 years ago. He has never used smokeless tobacco. He reports that he does not drink alcohol and does not use drugs.   Physical Exam: Ht 5\' 10"  (1.778 m)   Wt 297 lb 9.6 oz (135 kg)   BMI 42.70 kg/m   Constitutional:  Alert and oriented, No acute distress. HEENT: [place] AT, moist mucus membranes.  Trachea midline Cardiovascular: No clubbing, cyanosis, or edema. Respiratory: Normal respiratory effort, no increased work of breathing. GU: No CVA tenderness.  No bladder fullness or masses.  Patient with uncircumcised phallus. Foreskin easily retracted  Urethral meatus is patent.  No penile discharge. No  penile lesions or rashes. Scrotum without lesions, cysts, rashes and/or edema.  Bilateral hydroceles, testicles are located scrotally bilaterally. No masses are appreciated in the testicles. Left and right epididymis are normal. Right hydrocele . Rectal: Patient with  normal sphincter tone. Anus and perineum without scarring or rashes. No rectal masses are appreciated. Prostate is approximately 45 grams, could only palpate the apex, no nodules are appreciated. Seminal vesicles could not be palpated Neurologic: Grossly intact, no focal deficits, moving all 4 extremities. Psychiatric: Normal mood and affect.  Laboratory Data: Lab Results  Component Value Date   CREATININE 1.10 04/24/2022   Lab Results  Component Value Date   HGBA1C 5.2 07/01/2022   CBC    Component Value Date/Time   WBC 5.2 07/01/2022 1118   WBC 8.1 04/26/2022 0940   RBC 4.75 07/01/2022 1118   RBC 3.54 (L) 04/26/2022 0940   HGB 14.4 07/01/2022 1118   HCT 42.1 07/01/2022 1118   PLT 219 07/01/2022 1118   MCV 89 07/01/2022 1118   MCH 30.3 07/01/2022 1118   MCH 31.4 04/26/2022 0940   MCHC 34.2 07/01/2022 1118   MCHC 35.4 04/26/2022 0940   RDW 12.0 07/01/2022 1118   LYMPHSABS 2.0 07/01/2022 1118   MONOABS 0.3 04/23/2022 1955   EOSABS 0.2 07/01/2022 1118   BASOSABS 0.0 07/01/2022 1118   Component     Latest Ref Rng 05/28/2021  Est. average glucose Bld gHb Est-mCnc     mg/dL 05/30/2021    Component     Latest Ref Rng 07/01/2022  Prostate Specific Ag, Serum     0.0 - 4.0 ng/mL  0.3   I have reviewed the labs. .  Urinalysis Component     Latest Ref Rng 07/04/2022  Glucose, UA     NEGATIVE mg/dL NEGATIVE   WBC, UA     0 - 5 WBC/hpf 0-5   Bacteria, UA     NONE SEEN  FEW !   Appearance     CLEAR  CLEAR   Color, Urine     YELLOW  YELLOW   Specific Gravity, Urine     1.005 - 1.030  1.015   pH     5.0 - 8.0  6.0   Hgb urine dipstick     NEGATIVE  TRACE !   Bilirubin Urine     NEGATIVE  NEGATIVE   Ketones, ur      NEGATIVE mg/dL NEGATIVE   Protein     NEGATIVE mg/dL NEGATIVE   Nitrite     NEGATIVE  NEGATIVE   Leukocytes,Ua     NEGATIVE  NEGATIVE   Squamous Epithelial / LPF     0 - 5  0-5   RBC / HPF     0 - 5 RBC/hpf 0-5     Legend: ! Abnormal I have reviewed the labs.   Pertinent Imaging: KUB see HPI. Radiologist interpretation still pending.   Assessment & Plan:    1. Flank pain -UA bland -KUB no definite stones appreciated -urine culture is pending -notified of UA results and KUB results by MyChart -patient was offered a repeat Xray in two weeks or pursue CT scan at this time   2. Frequency -PVR has indicated incomplete bladder emptying  -explained to Mr. Fluhr how this contributes to urinary frequency -will readdress once urine culture has returned and stones has been ruled in or out   Return for pending urine culture results .  [facility] [address] [place], [place] [address] [phone]  I, Kirke Shaggy Littejohn,acting as a scribe for [facility], PA-C.,have documented all relevant documentation on the behalf of Aubrynn Katona, PA-C,as directed by  [facility], PA-C while in the presence of [place], PA-C.  I have reviewed the above documentation for accuracy and completeness, and I agree with the above.    Zara Council, PA-C

## 2022-07-04 NOTE — Telephone Encounter (Signed)
-----   Message from Harle Battiest, PA-C sent at 07/04/2022  2:04 PM EDT ----- Please let Mr. Streety know that his UA looked fine and is not suspicious for stones.  His Xray has some calcifications in his pelvis, but we cannot be certain if they are stones or phleboliths.  Our choices at this time is to repeat the Xray in two weeks to see if anything changes or pursue a CT scan at this time.

## 2022-07-04 NOTE — Telephone Encounter (Signed)
Pt left v/m wants to know the results of  xray and tests that were done today. Please call [phone]

## 2022-07-04 NOTE — Telephone Encounter (Signed)
Notified pt as advised. Made 2 week f/u repeat KUB appt in [place]. Pt expressed understanding.

## 2022-07-05 LAB — URINE CULTURE: Culture: <10000 — AB

## 2022-07-05 NOTE — Telephone Encounter (Signed)
See additional telephone encounter.

## 2022-07-06 ENCOUNTER — Ambulatory Visit: Payer: Medicare Other | Attending: Neurology | Admitting: Physical Therapy

## 2022-07-06 ENCOUNTER — Telehealth: Payer: Self-pay

## 2022-07-06 ENCOUNTER — Encounter: Payer: Self-pay | Admitting: Physical Therapy

## 2022-07-06 DIAGNOSIS — R279 Unspecified lack of coordination: Secondary | ICD-10-CM | POA: Insufficient documentation

## 2022-07-06 DIAGNOSIS — R262 Difficulty in walking, not elsewhere classified: Secondary | ICD-10-CM | POA: Diagnosis not present

## 2022-07-06 DIAGNOSIS — M6281 Muscle weakness (generalized): Secondary | ICD-10-CM | POA: Insufficient documentation

## 2022-07-06 DIAGNOSIS — M25611 Stiffness of right shoulder, not elsewhere classified: Secondary | ICD-10-CM | POA: Insufficient documentation

## 2022-07-06 DIAGNOSIS — R269 Unspecified abnormalities of gait and mobility: Secondary | ICD-10-CM | POA: Diagnosis not present

## 2022-07-06 DIAGNOSIS — R2689 Other abnormalities of gait and mobility: Secondary | ICD-10-CM | POA: Insufficient documentation

## 2022-07-06 DIAGNOSIS — R2681 Unsteadiness on feet: Secondary | ICD-10-CM | POA: Insufficient documentation

## 2022-07-06 NOTE — Telephone Encounter (Signed)
-----   Message from Harle Battiest, PA-C sent at 07/05/2022  6:03 PM EDT ----- Please let Tom Underwood know that his urine culture was negative.

## 2022-07-06 NOTE — Therapy (Signed)
OUTPATIENT PHYSICAL THERAPY TREATMENT   Patient Name: Tom Underwood MRN: [id number] DOB:1950-12-26, 71 y.o., male Today's Date: 07/06/2022  PCP: Steele Sizer, MD REFERRING PROVIDER: Myles Gip, DO   PT End of Session - 07/06/22 1247     Visit Number 9    Number of Visits 13    Date for PT Re-Evaluation 08/03/22    Activity Tolerance Patient tolerated treatment well;Patient limited by fatigue    Behavior During Therapy [facility] for tasks assessed/performed            1307 to 1402  (55 minutes).      Past Medical History:  Diagnosis Date   Anxiety    Arthritis    Atrial fibrillation ([place])    a.) CHA2DS2-VASc = 4 (age, HTN, CVA x2). b.) s/p 200 J synchronized cardioversion (DCCV) on 12/08/2021. c.) rate/rythum maintained on oral metoprolol tartrate; chronically anticoagulated using rivaroxaban.   BPH (benign prostatic hyperplasia)    Complication of anesthesia    a.)  Intolerant of propofol; causes "skin burning and involuntary muscle twitching"   DDD (degenerative disc disease), lumbar    Decreased libido    Depression    Diverticulitis    Diverticulosis    GERD (gastroesophageal reflux disease)    Hepatic steatosis    History of 2019 novel coronavirus disease (COVID-19) 12/28/2020   History of cardiac murmur in childhood    History of kidney stones    HLD (hyperlipidemia)    HLD (hyperlipidemia)    Hypertension    Hypogonadism in male    Long term current use of anticoagulant    a.) rivaroxaban   OSA on CPAP    Pre-diabetes    Rhinitis, allergic    Stroke ([place]) 07/23/2019   a.) LEFT posterior cor radiata. b.) residual RIGHT sided weakness   Past Surgical History:  Procedure Laterality Date   CARDIOVERSION N/A 12/08/2021   Procedure: CARDIOVERSION;  Surgeon: Nelva Bush, MD;  Location: [facility] ORS;  Service: Cardiovascular;  Laterality: N/A;   COLONOSCOPY     EVALUATION UNDER ANESTHESIA WITH HEMORRHOIDECTOMY N/A 04/15/2022   Procedure: EXAM UNDER  ANESTHESIA WITH HEMORRHOIDECTOMY;  Surgeon: Benjamine Sprague, DO;  Location: [facility] ORS;  Service: General;  Laterality: N/A;   EXTERNAL EAR SURGERY     EYE SURGERY     FINGER SURGERY Right    lateration to 5 th digit   KNEE SURGERY     left eye     RECTAL EXAM UNDER ANESTHESIA N/A 04/23/2022   Procedure: RECTAL EXAM UNDER ANESTHESIA WITH LIGATION OF BLEEDING;  Surgeon: Olean Ree, MD;  Location: [facility] ORS;  Service: General;  Laterality: N/A;   Patient Active Problem List   Diagnosis Date Noted   Gastroesophageal reflux disease without esophagitis 05/17/2022   Dysthymia 05/17/2022   History of syncope 05/17/2022   History of hemorrhoidectomy 05/17/2022   Vitamin D deficiency 05/17/2022   Lower extremity edema 05/17/2022   History of rectal bleeding 05/17/2022   Bleeding internal hemorrhoids    Morbid obesity ([place])    Persistent atrial fibrillation ([place]) 07/14/2020   Hypertension    Hemiparesis of right dominant side as late effect of cerebral infarction ([place]) 08/13/2019   History of kidney stones 05/02/2018   ED (erectile dysfunction) 05/02/2018   Hyperglycemia 01/30/2017   Arthritis of knee, degenerative 01/30/2017   Benign prostatic hyperplasia without lower urinary tract symptoms 02/03/2016   Dyslipidemia 08/04/2015   Diverticulitis of large intestine without perforation or abscess without bleeding 06/30/2015  OSA on CPAP 06/30/2015    REFERRING DIAG: M25.511,M25.512 (ICD-10-CM) - Bilateral shoulder pain, unspecified chronicity M17.2 (ICD-10-CM) - Post-traumatic osteoarthritis of both knees  R26.81 (ICD-10-CM) - Gait instability    THERAPY DIAG:  Difficulty in walking, not elsewhere classified  Muscle weakness (generalized)  Other abnormalities of gait and mobility  Unsteadiness on feet  Abnormality of gait and mobility  Unspecified lack of coordination  PERTINENT HISTORY: See evaluation  PRECAUTIONS: Fall  SUBJECTIVE:  Pt reports knees are a little better then  last week.  Pt. Reports 9/10 pain in his knees.  Pt. Continues to have persistent swelling in B lower legs (R>L).  Moderate pitting edema present in B lower leg (R>L).  Pt. Bought compression stockings but received the wrong size, waiting on 5XL.  Pt. States he has calcium deposits on hips but no kidney stones.      PAIN:  Are you having pain? Yes: NPRS scale: 9/10 Pain location: B knee Pain description: aching Aggravating factors: prolonged standing/ walking Relieving factors: rest    TODAY'S TREATMENT:   07/06/22:  Nustep L5 10 min. B UE/LE (0.54 miles).     12" step touches with light UE assist on handrails 10x each L/R.    Walking over 6" hurdles with recip. Pattern/ cone taps at agility ladder with SBA with focus on wt. Bearing and wt. Transfer.    Forward/ backwards/ lateral walking in //-bars with increase hip flexion/ heel and toe strike.  5 laps.  Seated rest break secondary to fatigue.    Walking in clinic/ hallway with head turns/ cuing to increase BOS/ heel strike.  Walking outside down ramp while carrying SPC/ small bag.    Discussed daily HEP/ walking at home.      PATIENT EDUCATION: Education details: HEP/ daily activity Person educated: Patient Education method: Customer service manager Education comprehension: verbalized understanding and returned demonstration   HOME EXERCISE PROGRAM: Handouts given for shoulder ROM/ strengthening/ standing hip ex.    PT Short Term Goals -      PT SHORT TERM GOAL #1   Title Pt will be indep with HEP to improve strength and balance to reduce risk of falls.    Baseline 11/23: initiated    Time 4    Period Weeks    Status Achieved              PT Long Term Goals -      PT LONG TERM GOAL #1   Title Pt will improve FOTO score to 50 to display improvement in functional mobility.    Baseline 6/7: 33   Time 12   Period Weeks    Status Initial   Target Date 08/03/22      PT LONG TERM GOAL #2   Title Pt  wil improve TUG score to < 12 sec with no AD to display significant imporvement in reduced risk of falls    Baseline 11/23: 26 sec (did not sit down immediately leading to higher time than he would've scored) 1/19: 14.44 sec.   6/7: 12.66 sec.    Time 12    Period Weeks    Status Partially Met    Target Date 08/03/22      PT LONG TERM GOAL #3   Title Pt will improve DGI to >19/24 to indicate decreased risk of falls with household and community walking tasks    Baseline 11/23: 11/24 1/19:12/24    Time 12    Period Weeks  Status Partially Met    Target Date 08/03/22      PT LONG TERM GOAL #4   Title Pt. Will increase R hip flexion/ LE muscle strength 1/2 muscle grade to improve standing tolerance/ walking endurance.    Baseline See above   Time 12    Period Weeks    Status Partially Met    Target Date 08/03/22              Plan -     Clinical Impression Statement Continuing POC with focus on LE strengthening and cardiovascular endurance. Pts. Endurance since surgery has shown slow but marked progress, esp. With use of Nustep.  Pt. Has significant swelling in B lower legs and pitting edema.  Pt will continue to benefit from skilled PT services to progress strength and mobility, along with cardiovascular endurance.    Personal Factors and Comorbidities Comorbidity 2    Comorbidities Hypertension, Obesity    Examination-Activity Limitations Bed Mobility;Reach Overhead;Squat;Lift;Dressing;Hygiene/Grooming    Examination-Participation Restrictions Community Activity;Yard Work    [employer]chant [employer] officer Evolving/Moderate complexity    Clinical Decision Making Moderate    Rehab Potential Fair    PT Frequency 1x / week    PT Duration 12 weeks    PT Treatment/Interventions ADLs/Self Care Home Management;Gait training;Stair training;Functional mobility training;Therapeutic activities;Therapeutic exercise;Balance training;Neuromuscular re-education;Manual  techniques;Patient/family education;Electrical Stimulation;Moist Heat;Cryotherapy    PT Next Visit Plan LE muscle strengthening/ endurance training.    PT Home Exercise Plan Shoulder pulleys in flex, scaption, abd, pendelums, scap retractions with resistance (RTB), D2 flexion pattern in sitting no resistance.    Consulted and Agree with Plan of Care Patient            Pura Spice, PT, DPT # [phone] 07/06/2022, 1:13 PM

## 2022-07-06 NOTE — Telephone Encounter (Signed)
Pt notified via Mychart. See separate encounter.  

## 2022-07-14 ENCOUNTER — Encounter: Payer: Self-pay | Admitting: Physical Therapy

## 2022-07-14 ENCOUNTER — Ambulatory Visit: Payer: Medicare Other | Admitting: Physical Therapy

## 2022-07-14 DIAGNOSIS — R2689 Other abnormalities of gait and mobility: Secondary | ICD-10-CM

## 2022-07-14 DIAGNOSIS — R279 Unspecified lack of coordination: Secondary | ICD-10-CM | POA: Diagnosis not present

## 2022-07-14 DIAGNOSIS — R269 Unspecified abnormalities of gait and mobility: Secondary | ICD-10-CM

## 2022-07-14 DIAGNOSIS — R262 Difficulty in walking, not elsewhere classified: Secondary | ICD-10-CM

## 2022-07-14 DIAGNOSIS — M6281 Muscle weakness (generalized): Secondary | ICD-10-CM

## 2022-07-14 DIAGNOSIS — R2681 Unsteadiness on feet: Secondary | ICD-10-CM

## 2022-07-14 NOTE — Therapy (Signed)
OUTPATIENT PHYSICAL THERAPY TREATMENT Physical Therapy Progress Note   Dates of reporting period  05/11/22  to  07/14/22   Patient Name: Tom Underwood MRN: [id number] DOB:11/16/1951, 71 y.o., male Today's Date: 07/14/2022  PCP: Steele Sizer, MD REFERRING PROVIDER: Myles Gip, DO   PT End of Session - 07/14/22 1121     Visit Number 10    Number of Visits 13    Date for PT Re-Evaluation 08/03/22    PT Start Time 1121    Activity Tolerance Patient tolerated treatment well;Patient limited by fatigue    Behavior During Therapy [facility] for tasks assessed/performed              Past Medical History:  Diagnosis Date   Anxiety    Arthritis    Atrial fibrillation ([place])    a.) CHA2DS2-VASc = 4 (age, HTN, CVA x2). b.) s/p 200 J synchronized cardioversion (DCCV) on 12/08/2021. c.) rate/rythum maintained on oral metoprolol tartrate; chronically anticoagulated using rivaroxaban.   BPH (benign prostatic hyperplasia)    Complication of anesthesia    a.)  Intolerant of propofol; causes "skin burning and involuntary muscle twitching"   DDD (degenerative disc disease), lumbar    Decreased libido    Depression    Diverticulitis    Diverticulosis    GERD (gastroesophageal reflux disease)    Hepatic steatosis    History of 2019 novel coronavirus disease (COVID-19) 12/28/2020   History of cardiac murmur in childhood    History of kidney stones    HLD (hyperlipidemia)    HLD (hyperlipidemia)    Hypertension    Hypogonadism in male    Long term current use of anticoagulant    a.) rivaroxaban   OSA on CPAP    Pre-diabetes    Rhinitis, allergic    Stroke ([place]) 07/23/2019   a.) LEFT posterior cor radiata. b.) residual RIGHT sided weakness   Past Surgical History:  Procedure Laterality Date   CARDIOVERSION N/A 12/08/2021   Procedure: CARDIOVERSION;  Surgeon: Nelva Bush, MD;  Location: [facility] ORS;  Service: Cardiovascular;  Laterality: N/A;   COLONOSCOPY     EVALUATION  UNDER ANESTHESIA WITH HEMORRHOIDECTOMY N/A 04/15/2022   Procedure: EXAM UNDER ANESTHESIA WITH HEMORRHOIDECTOMY;  Surgeon: Benjamine Sprague, DO;  Location: [facility] ORS;  Service: General;  Laterality: N/A;   EXTERNAL EAR SURGERY     EYE SURGERY     FINGER SURGERY Right    lateration to 5 th digit   KNEE SURGERY     left eye     RECTAL EXAM UNDER ANESTHESIA N/A 04/23/2022   Procedure: RECTAL EXAM UNDER ANESTHESIA WITH LIGATION OF BLEEDING;  Surgeon: Olean Ree, MD;  Location: [facility] ORS;  Service: General;  Laterality: N/A;   Patient Active Problem List   Diagnosis Date Noted   Gastroesophageal reflux disease without esophagitis 05/17/2022   Dysthymia 05/17/2022   History of syncope 05/17/2022   History of hemorrhoidectomy 05/17/2022   Vitamin D deficiency 05/17/2022   Lower extremity edema 05/17/2022   History of rectal bleeding 05/17/2022   Bleeding internal hemorrhoids    Morbid obesity ([place])    Persistent atrial fibrillation ([place]) 07/14/2020   Hypertension    Hemiparesis of right dominant side as late effect of cerebral infarction ([place]) 08/13/2019   History of kidney stones 05/02/2018   ED (erectile dysfunction) 05/02/2018   Hyperglycemia 01/30/2017   Arthritis of knee, degenerative 01/30/2017   Benign prostatic hyperplasia without lower urinary tract symptoms 02/03/2016   Dyslipidemia 08/04/2015  Diverticulitis of large intestine without perforation or abscess without bleeding 06/30/2015   OSA on CPAP 06/30/2015    REFERRING DIAG: M25.511,M25.512 (ICD-10-CM) - Bilateral shoulder pain, unspecified chronicity M17.2 (ICD-10-CM) - Post-traumatic osteoarthritis of both knees  R26.81 (ICD-10-CM) - Gait instability    THERAPY DIAG:  Difficulty in walking, not elsewhere classified  Muscle weakness (generalized)  Other abnormalities of gait and mobility  Unsteadiness on feet  Abnormality of gait and mobility  Unspecified lack of coordination  PERTINENT HISTORY: See  evaluation  PRECAUTIONS: Fall  SUBJECTIVE:  Pt. States B knee pain remains around a 9/10.  Pt. Has pain with standing which improves with movement.    PAIN:  Are you having pain? Yes: NPRS scale: 9/10 Pain location: B knee Pain description: aching Aggravating factors: prolonged standing/ walking Relieving factors: rest    TODAY'S TREATMENT:   07/14/22:  Nautilus: seated lat. Pull downs 50#/ tricep ex. With bar 40#/  scap. Retraction 50% 30x each.  Good shoulder flexion stretches during lat. Ex.   Nustep L5 10 min. B UE/LE.     12" step touches with light UE assist on handrails 10x each L/R.    Resisted gait 2BTB 5x all 4-planes in //-bars.  Recip. Step pattern/ cuing to correct posture.   High marching in //- bars with upright posture 3 laps.     Walking in clinic/ hallway with head turns/ cuing to increase BOS/ heel strike.   Discussed daily HEP/ walking at home.      PATIENT EDUCATION: Education details: HEP/ daily activity Person educated: Patient Education method: Customer service manager Education comprehension: verbalized understanding and returned demonstration   HOME EXERCISE PROGRAM: Handouts given for shoulder ROM/ strengthening/ standing hip ex.    PT Short Term Goals -      PT SHORT TERM GOAL #1   Title Pt will be indep with HEP to improve strength and balance to reduce risk of falls.    Baseline 11/23: initiated    Time 4    Period Weeks    Status Achieved              PT Long Term Goals -      PT LONG TERM GOAL #1   Title Pt will improve FOTO score to 50 to display improvement in functional mobility.    Baseline 6/7: 33   Time 12   Period Weeks    Status Initial   Target Date 08/03/22      PT LONG TERM GOAL #2   Title Pt wil improve TUG score to < 12 sec with no AD to display significant imporvement in reduced risk of falls    Baseline 11/23: 26 sec (did not sit down immediately leading to higher time than he would've scored)  1/19: 14.44 sec.   6/7: 12.66 sec.    Time 12    Period Weeks    Status Partially Met    Target Date 08/03/22      PT LONG TERM GOAL #3   Title Pt will improve DGI to >19/24 to indicate decreased risk of falls with household and community walking tasks    Baseline 11/23: 11/24 1/19:12/24    Time 12    Period Weeks    Status Partially Met    Target Date 08/03/22      PT LONG TERM GOAL #4   Title Pt. Will increase R hip flexion/ LE muscle strength 1/2 muscle grade to improve standing tolerance/ walking endurance.  Baseline See above   Time 12    Period Weeks    Status Partially Met    Target Date 08/03/22              Plan -     Clinical Impression Statement Continuing POC with focus on LE strengthening and cardiovascular endurance. Pt. Works hard during tx. Session with prolonged standing/ walking tasks with few seated rest breaks today.  Pt. Has significant swelling in B lower legs and pitting edema.  Pt will continue to benefit from skilled PT services to progress strength and mobility, along with cardiovascular endurance.    Personal Factors and Comorbidities Comorbidity 2    Comorbidities Hypertension, Obesity    Examination-Activity Limitations Bed Mobility;Reach Overhead;Squat;Lift;Dressing;Hygiene/Grooming    Examination-Participation Restrictions Community Activity;Yard Work    [employer] officer Evolving/Moderate complexity    Clinical Decision Making Moderate    Rehab Potential Fair    PT Frequency 1x / week    PT Duration 12 weeks    PT Treatment/Interventions ADLs/Self Care Home Management;Gait training;Stair training;Functional mobility training;Therapeutic activities;Therapeutic exercise;Balance training;Neuromuscular re-education;Manual techniques;Patient/family education;Electrical Stimulation;Moist Heat;Cryotherapy    PT Next Visit Plan LE muscle strengthening/ endurance training.    PT Home Exercise Plan Shoulder pulleys in flex, scaption,  abd, pendelums, scap retractions with resistance (RTB), D2 flexion pattern in sitting no resistance.    Consulted and Agree with Plan of Care Patient          Pura Spice, PT, DPT # [phone] 07/14/2022, 11:22 AM

## 2022-07-19 ENCOUNTER — Telehealth: Payer: Self-pay

## 2022-07-19 NOTE — Progress Notes (Signed)
    Chronic Care Management Pharmacy Assistant   Name: Tom Underwood  MRN: [id number] DOB: 10/03/1951  Patient called to be reminded of his telephone appointment with [name], CPP on 07/20/2022 @ 1100.  Patient aware of appointment date, time, and type of appointment (either telephone or in person). Patient aware to have/bring all medications, supplements, blood pressure and/or blood sugar logs to visit.  Star Rating Drug: Rosuvastatin 20 mg last filled on 06/02/2022  for a 76-Day supply with [name] Valsartan 80 mg  last filled on 06/02/2022  for a 76-Day supply with [name]  Any gaps in medications fill history? No   Care Gaps: Zoster Vaccine Influenza Vaccine   Adelene Idler, CPA/CMA Clinical Pharmacist Assistant Phone: [phone]

## 2022-07-20 ENCOUNTER — Ambulatory Visit (INDEPENDENT_AMBULATORY_CARE_PROVIDER_SITE_OTHER): Payer: Medicare Other

## 2022-07-20 DIAGNOSIS — E785 Hyperlipidemia, unspecified: Secondary | ICD-10-CM

## 2022-07-20 DIAGNOSIS — I10 Essential (primary) hypertension: Secondary | ICD-10-CM

## 2022-07-20 NOTE — Progress Notes (Signed)
Chronic Care Management Pharmacy Note  07/21/2022 Name:  Tom Underwood MRN:  [id number] DOB:  1951-07-01  Summary: Patient presents for CCM follow-up.   Recommendations/Changes made from today's visit: Continue current medications  Plan: CPP follow-up 6 months   Subjective: Tom Underwood is an 71 y.o. year old male who is a primary patient of Steele Sizer, MD.  The CCM team was consulted for assistance with disease management and care coordination needs.    Engaged with patient by telephone for follow up visit in response to provider referral for pharmacy case management and/or care coordination services.   Consent to Services:  The patient was given information about Chronic Care Management services, agreed to services, and gave verbal consent prior to initiation of services.  Please see initial visit note for detailed documentation.   Patient Care Team: Steele Sizer, MD as PCP - General (Family Medicine) Kate Sable, MD as PCP - Cardiology (Cardiology) Vickie Epley, MD as PCP - Electrophysiology (Cardiology) Gardiner Barefoot, DPM as Consulting Physician (Podiatry) Laneta Simmers as Physician Assistant (Urology) Germaine Pomfret, [facility] (Pharmacist) Vladimir Crofts, MD as Consulting Physician (Neurology) Benjamine Sprague, DO as Consulting Physician (Surgery) Pura Spice, PT as Physical Therapist (Physical Therapy)  Recent office visits: 11/02/21: Patient presented to Dr. Ancil Boozer for follow-up. Tamsulosin started. Referral to PT.   Recent consult visits: 12/30/21: Patient presented to Dr. Manuella Ghazi (Neurology) for follow-up.  12/29/21: Patient presented to Dr. Quentin Ore (Cardiology) for follow-up.   Hospital visits: 12/08/21: Cardioversion.    Objective:  Lab Results  Component Value Date   CREATININE 1.10 04/24/2022   BUN 18 04/24/2022   GFRNONAA >60 04/24/2022   GFRAA >60 07/17/2020   NA 141 04/24/2022   K 4.4 04/24/2022   CALCIUM 9.1  04/24/2022   CO2 28 04/24/2022   GLUCOSE 139 (H) 04/24/2022    Lab Results  Component Value Date/Time   HGBA1C 5.2 07/01/2022 11:18 AM   HGBA1C 5.5 05/28/2021 01:36 PM    Last diabetic Eye exam: No results found for: "HMDIABEYEEXA"  Last diabetic Foot exam: No results found for: "HMDIABFOOTEX"   Lab Results  Component Value Date   CHOL 116 07/01/2022   HDL 48 07/01/2022   LDLCALC 55 07/01/2022   TRIG 59 07/01/2022   CHOLHDL 2.4 07/01/2022       Latest Ref Rng & Units 04/23/2022    7:55 PM 04/23/2022    4:36 PM 08/31/2021    2:34 PM  Hepatic Function  Total Protein 6.5 - 8.1 g/dL 6.6  6.4  8.2   Albumin 3.5 - 5.0 g/dL 3.5  3.5  4.2   AST 15 - 41 U/L 25  21  21    ALT 0 - 44 U/L 20  20  26    Alk Phosphatase 38 - 126 U/L 84  84  109   Total Bilirubin 0.3 - 1.2 mg/dL 1.2  1.4  1.7     Lab Results  Component Value Date/Time   TSH 2.530 05/28/2021 01:36 PM   TSH 2.986 07/14/2020 11:56 PM   TSH 2.311 07/14/2020 05:19 PM   TSH 2.400 08/07/2015 09:22 AM       Latest Ref Rng & Units 07/01/2022   11:18 AM 04/26/2022    9:40 AM 04/24/2022    5:17 AM  CBC  WBC 3.4 - 10.8 x10E3/uL 5.2  8.1  10.2   Hemoglobin 13.0 - 17.7 g/dL 14.4  11.1  11.9   Hematocrit 37.5 -  51.0 % 42.1  31.4  35.7   Platelets 150 - 450 x10E3/uL 219  252  270     Lab Results  Component Value Date/Time   VD25OH 64.6 05/28/2021 01:36 PM    Clinical ASCVD: Yes  The ASCVD Risk score (Arnett DK, et al., 2019) failed to calculate for the following reasons:   The patient has a prior MI or stroke diagnosis       05/17/2022   10:36 AM 05/17/2022   10:11 AM 02/25/2022    2:42 PM  Depression screen PHQ 2/9  Decreased Interest 1 1 0  Down, Depressed, Hopeless 1 1 0  PHQ - 2 Score 2 2 0  Altered sleeping 0 0 0  Tired, decreased energy 1 1 0  Change in appetite 0 0 0  Feeling bad or failure about yourself  0 0 0  Trouble concentrating 0 0 0  Moving slowly or fidgety/restless 0 0 0  Suicidal thoughts 0 0 0   PHQ-9 Score 3 3 0  Difficult doing work/chores  Not difficult at all Not difficult at all    Social History   Tobacco Use  Smoking Status Former   Types: Pipe   Start date: 02/20/1973   Quit date: 02/20/1974   Years since quitting: 48.4  Smokeless Tobacco Never   BP Readings from Last 3 Encounters:  05/17/22 116/72  05/17/22 116/72  04/27/22 110/75   Pulse Readings from Last 3 Encounters:  05/17/22 (!) 55  05/17/22 (!) 55  04/27/22 75   Wt Readings from Last 3 Encounters:  07/04/22 297 lb 9.6 oz (135 kg)  05/17/22 289 lb (131.1 kg)  05/17/22 289 lb 14.4 oz (131.5 kg)   BMI Readings from Last 3 Encounters:  07/04/22 42.70 kg/m  05/17/22 41.47 kg/m  05/17/22 41.60 kg/m    Assessment/Interventions: Review of patient past medical history, allergies, medications, health status, including review of consultants reports, laboratory and other test data, was performed as part of comprehensive evaluation and provision of chronic care management services.   SDOH:  (Social Determinants of Health) assessments and interventions performed: Yes    SDOH Screenings   Alcohol Screen: Low Risk  (05/17/2022)   Alcohol Screen    Last Alcohol Screening Score (AUDIT): 0  Depression (PHQ2-9): Low Risk  (05/17/2022)   Depression (PHQ2-9)    PHQ-2 Score: 3  Financial Resource Strain: Low Risk  (05/17/2022)   Overall Financial Resource Strain (CARDIA)    Difficulty of Paying Living Expenses: Not hard at all  Food Insecurity: No Food Insecurity (05/17/2022)   Hunger Vital Sign    Worried About Running Out of Food in the Last Year: Never true    [place] in the Last Year: Never true  Housing: Low Risk  (05/17/2022)   Housing    Last Housing Risk Score: 0  Physical Activity: Insufficiently Active (05/17/2022)   Exercise Vital Sign    Days of Exercise per Week: 1 day    Minutes of Exercise per Session: 60 min  Social Connections: Socially Integrated (05/17/2022)   Social Connection  and Isolation Panel [NHANES]    Frequency of Communication with Friends and Family: More than three times a week    Frequency of Social Gatherings with Friends and Family: Twice a week    Attends Religious Services: More than 4 times per year    Active Member of [employer] or Organizations: Yes    Attends Archivist Meetings: More than 4 times  per year    Marital Status: Married  Stress: No Stress Concern Present (05/17/2022)   [place]    Feeling of Stress : Not at all  Tobacco Use: Medium Risk (07/21/2022)   Patient History    Smoking Tobacco Use: Former    Smokeless Tobacco Use: Never    Passive Exposure: Not on file  Transportation Needs: No Transportation Needs (05/17/2022)   PRAPARE - Hydrologist (Medical): No    Lack of Transportation (Non-Medical): No    CCM Care Plan  Allergies  Allergen Reactions   Penicillins Itching    Medications Reviewed Today     Reviewed by Pura Spice, PT (Physical Therapist) on 07/21/22 at 1120  Med List Status: <None>   Medication Order Taking? Sig Documenting Provider Last Dose Status Informant  amitriptyline (ELAVIL) 25 MG tablet [id number] No TAKE 1 TABLET BY MOUTH AT BEDTIME AS NEEDED FOR SLEEP  Patient taking differently: Take 25 mg by mouth at bedtime as needed for sleep.   Steele Sizer, MD Taking Active Spouse/Significant Other  buPROPion (WELLBUTRIN XL) 150 MG 24 hr tablet [id number] No Take 1 tablet (150 mg total) by mouth every morning. Steele Sizer, MD Taking Active   busPIRone (BUSPAR) 10 MG tablet [id number] No Take 1 tablet (10 mg total) by mouth 2 (two) times daily. Steele Sizer, MD Taking Active   cetirizine (ZYRTEC) 10 MG tablet [id number] No Take 10 mg by mouth in the morning.  [provider] Taking Active Spouse/Significant Other  Cholecalciferol (VITAMIN D) 125 MCG (5000 UT) CAPS [id number] No Take 5,000  Units by mouth daily. [provider] Taking Active Spouse/Significant Other  docusate sodium (COLACE) 100 MG capsule [id number] No Take 100 mg by mouth daily.  Patient not taking: Reported on 07/20/2022   [provider] Not Taking Active   fluticasone (FLONASE) 50 MCG/ACT nasal spray [id number] No Place 2 sprays into both nostrils daily. Teodora Medici, DO Taking Active Spouse/Significant Other  furosemide (LASIX) 20 MG tablet [id number] No Take 1 tablet (20 mg total) by mouth as needed. For lower extremity swelling. Steele Sizer, MD Taking Active   GLUCOSAMINE-CHONDROITIN PO [id number] No Take 2 tablets by mouth in the morning and at bedtime. [provider] Taking Active Spouse/Significant Other           Med Note Arby Barrette   DZH Apr 23, 2022  2:18 PM)    lidocaine (XYLOCAINE) 5 % ointment [id number] No Apply 1 application. topically 3 (three) times daily as needed. Apply pea size amount to area as needed Lysle Pearl, Isami, DO Taking Active   methocarbamol (ROBAXIN) 500 MG tablet [id number] No Take 500 mg by mouth every 8 (eight) hours as needed for muscle spasms.  Patient not taking: Reported on 07/20/2022   [provider] Not Taking Active Spouse/Significant Other  metoprolol tartrate (LOPRESSOR) 50 MG tablet [id number] No Take 0.5 tablets (25 mg total) by mouth 2 (two) times daily. Kate Sable, MD Taking Active Spouse/Significant Other  Multiple Vitamin (MULTIVITAMIN) tablet [id number] No Take 1 tablet by mouth daily. [provider] Taking Active Spouse/Significant Other  omega-3 acid ethyl esters (LOVAZA) 1 g capsule [id number] No Take 2 capsules (2 g total) by mouth 2 (two) times daily. Steele Sizer, MD Taking Active   pantoprazole (PROTONIX) 20 MG tablet [id number] No Take 1 tablet (20 mg total) by mouth daily. Steele Sizer, MD Taking Active   rivaroxaban Alveda Reasons)  20 MG TABS tablet [id number] No Take 20 mg by mouth daily with  supper.  Patient not taking: Reported on 07/20/2022   Kate Sable, MD Not Taking Active            Med Note Daron Offer A   Wed Jul 20, 2022 11:06 AM) Patient to restart after follow-up with Cardiology 08/11/22  rosuvastatin (CRESTOR) 20 MG tablet [id number] No Take 1 tablet (20 mg total) by mouth daily.  Patient taking differently: Take 20 mg by mouth every evening.   Kate Sable, MD Taking Active Spouse/Significant Other  Saccharomyces boulardii (PROBIOTIC) 250 MG CAPS [id number] No Take 1 capsule by mouth daily. Steele Sizer, MD Taking Active Spouse/Significant Other  sildenafil (VIAGRA) 50 MG tablet [id number] No  [provider] Taking Active   tamsulosin (FLOMAX) 0.4 MG CAPS capsule [id number] No Take 1 capsule (0.4 mg total) by mouth daily. Steele Sizer, MD Taking Active   valsartan (DIOVAN) 80 MG tablet [id number] No Take 80 mg by mouth daily. Kate Sable, MD Taking Active   VISINE DRY EYE RELIEF 1 % SOLN [id number] No Place 1 drop into both eyes daily as needed (dry eyes). [provider] Taking Active Spouse/Significant Other            Patient Active Problem List   Diagnosis Date Noted   Gastroesophageal reflux disease without esophagitis 05/17/2022   Dysthymia 05/17/2022   History of syncope 05/17/2022   History of hemorrhoidectomy 05/17/2022   Vitamin D deficiency 05/17/2022   Lower extremity edema 05/17/2022   History of rectal bleeding 05/17/2022   Bleeding internal hemorrhoids    Morbid obesity ([place])    Persistent atrial fibrillation ([place]) 07/14/2020   Hypertension    Hemiparesis of right dominant side as late effect of cerebral infarction ([place]) 08/13/2019   History of kidney stones 05/02/2018   ED (erectile dysfunction) 05/02/2018   Hyperglycemia 01/30/2017   Arthritis of knee, degenerative 01/30/2017   Benign prostatic hyperplasia without lower urinary tract symptoms 02/03/2016   Dyslipidemia 08/04/2015    Diverticulitis of large intestine without perforation or abscess without bleeding 06/30/2015   OSA on CPAP 06/30/2015    Immunization History  Administered Date(s) Administered   Fluad Quad(high Dose 65+) 09/18/2019, 11/10/2020, 10/18/2021   Influenza, High Dose Seasonal PF 10/05/2018   Influenza,inj,Quad PF,6+ Mos 08/30/2017   Influenza-Unspecified 09/26/2016   Moderna Covid-19 Vaccine Bivalent Booster 51yr & up 03/31/2022   Moderna Sars-Covid-2 Vaccination 01/21/2020, 02/18/2020, 11/23/2020   Pneumococcal Conjugate-13 11/05/2019   Pneumococcal Polysaccharide-23 10/19/2018   Tdap 02/16/2018, 07/14/2020   Zoster, Live 02/24/2017    Conditions to be addressed/monitored:  Hypertension, Hyperlipidemia, Atrial Fibrillation, Anxiety, and Insomnia  Care Plan : General Pharmacy (Adult)  Updates made by FGermaine Pomfret RPH since 07/21/2022 12:00 AM     Problem: Hypertension, Hyperlipidemia, Atrial Fibrillation, Anxiety, and Insomnia   Priority: High     Long-Range Goal: Patient-Specific Goal   Start Date: 07/22/2021  Expected End Date: 07/22/2023  This Visit's Progress: On track  Recent Progress: On track  Priority: High  Note:   Current Barriers:  Unable to independently afford treatment regimen  Pharmacist Clinical Goal(s):  Patient will verbalize ability to afford treatment regimen maintain control of blood pressure as evidenced by BP less than 140/90  through collaboration with PharmD and provider.   Interventions: 1:1 collaboration with SSteele Sizer MD regarding development and update of comprehensive plan of care as evidenced by provider attestation and co-signature Inter-disciplinary care team collaboration (see  longitudinal plan of care) Comprehensive medication review performed; medication list updated in electronic medical record  Hypertension (BP goal <130/80) -Controlled -Current treatment: Furosemide 20 mg daily as needed: Appropriate, Effective, Safe,  Accessible  Metoprolol tartrate 50 mg 1/2 tablet twice daily: Appropriate, Effective, Safe, Accessible  Valsartan 80 mg daily: Appropriate, Effective, Safe, Accessible -Medications previously tried: NA  -Current home readings: 139-149/70-80,  -Office blood pressures often elevated due to white coat hypertension.  -Current dietary habits: trying to limit salt, greasy/fried foods. -Current exercise habits: Continues with rehab once weekly. Walks a few times weekly weather permitting.  -Denies hypotensive/hypertensive symptoms.  -Recommended to continue current medication  Hyperlipidemia: (LDL goal < 70) -Controlled -Stroke 2020 -Current treatment: Lovaza 2g twice daily  Rosuvastatin 20 mg daily  -Medications previously tried: NA  -Recommended to continue current medication  Atrial Fibrillation (Goal: prevent stroke and major bleeding) -Controlled -CHADSVASC: 4 -Current treatment: Rate control: Metoprolol tartrate 50 mg twice daily  Anticoagulation: None  -Medications previously tried: NA -Given history of recent GI bleed, patient will have extensive discussion with Cardiology regarding risks vs benefits of resuming Xarelto. He has a follow-up in 3 weeks with Cardiology.  -Recommended to continue current medication  Depression/Anxiety/Insomnia(Goal: Maintain stable mood) -Controlled -Counseling with Dr. Michail Sermon  -Current treatment: Amitriptyline 25 mg nightly as needed Wellbutrin XL 150 mg daily  Buspirone 10 mg twice daily (Often forgets PM dose)  -Medications previously tried/failed: NA -Recommended to continue current medication  BPH (Goal: Minimize symptoms) -Controlled -Current treatment  Tamsulosin 0.4 mg daily: Appropriate, Effective, Safe, Accessible -Medications previously tried: NA -More urinary frequency throughout the day.  Mild improvement in symptoms since starting tamsulosin. Still has to use the restroom every 2-3 hours.  -Recommended to continue current  medication  Patient Goals/Self-Care Activities Patient will:  - check blood pressure weekly, document, and provide at future appointments target a minimum of 150 minutes of moderate intensity exercise weekly  Follow Up Plan: Telephone follow up appointment with care management team member scheduled for:  01/18/23 at 11:00 AM     Medication Assistance: None required.  Patient affirms current coverage meets needs.  Compliance/Adherence/Medication fill history: Care Gaps: COVID-19 Vaccine Booster 4 INFLUENZA VACCINE  [facility] Drugs: Valsartan 80 mg last filled on 10/10/2020 for a 90-Day supply with [facility] Rosuvastatin 20 mg daily: Last filled 05/25/21 for 90-DS   Patient's preferred pharmacy is:  [facility] [place], [address] [place] [address] Phone: [phone] Fax: [phone]   Uses pill box? Yes Pt endorses 85% compliance  We discussed: Current pharmacy is preferred with insurance plan and patient is satisfied with pharmacy services Patient decided to: Continue current medication management strategy  Care Plan and Follow Up Patient Decision:  Patient agrees to Care Plan and Follow-up.  Plan: Telephone follow up appointment with care management team member scheduled for:  01/18/23 at 11:00 AM  Malva Limes, [facility] [phone]

## 2022-07-21 ENCOUNTER — Ambulatory Visit: Payer: Medicare Other | Admitting: Physical Therapy

## 2022-07-21 ENCOUNTER — Encounter: Payer: Self-pay | Admitting: Physical Therapy

## 2022-07-21 DIAGNOSIS — R262 Difficulty in walking, not elsewhere classified: Secondary | ICD-10-CM | POA: Diagnosis not present

## 2022-07-21 DIAGNOSIS — R279 Unspecified lack of coordination: Secondary | ICD-10-CM

## 2022-07-21 DIAGNOSIS — R2689 Other abnormalities of gait and mobility: Secondary | ICD-10-CM | POA: Diagnosis not present

## 2022-07-21 DIAGNOSIS — R2681 Unsteadiness on feet: Secondary | ICD-10-CM

## 2022-07-21 DIAGNOSIS — M25611 Stiffness of right shoulder, not elsewhere classified: Secondary | ICD-10-CM

## 2022-07-21 DIAGNOSIS — R269 Unspecified abnormalities of gait and mobility: Secondary | ICD-10-CM | POA: Diagnosis not present

## 2022-07-21 DIAGNOSIS — M6281 Muscle weakness (generalized): Secondary | ICD-10-CM

## 2022-07-21 NOTE — Patient Instructions (Signed)
Visit Information It was great speaking with you today!  Please let me know if you have any questions about our visit.   Goals Addressed             This Visit's Progress    Track and Manage My Blood Pressure-Hypertension   On track    Timeframe:  Long-Range Goal Priority:  High Start Date:  07/22/2021                            Expected End Date:  07/23/2023                     Follow Up within 90 days   - check blood pressure weekly    Why is this important?   You won't feel high blood pressure, but it can still hurt your blood vessels.  High blood pressure can cause heart or kidney problems. It can also cause a stroke.  Making lifestyle changes like losing a little weight or eating less salt will help.  Checking your blood pressure at home and at different times of the day can help to control blood pressure.  If the doctor prescribes medicine remember to take it the way the doctor ordered.  Call the office if you cannot afford the medicine or if there are questions about it.     Notes:         Patient Care Plan: General Pharmacy (Adult)     Problem Identified: Hypertension, Hyperlipidemia, Atrial Fibrillation, Anxiety, and Insomnia   Priority: High     Long-Range Goal: Patient-Specific Goal   Start Date: 07/22/2021  Expected End Date: 07/22/2023  This Visit's Progress: On track  Recent Progress: On track  Priority: High  Note:   Current Barriers:  Unable to independently afford treatment regimen  Pharmacist Clinical Goal(s):  Patient will verbalize ability to afford treatment regimen maintain control of blood pressure as evidenced by BP less than 140/90  through collaboration with PharmD and provider.   Interventions: 1:1 collaboration with Alba Cory, MD regarding development and update of comprehensive plan of care as evidenced by provider attestation and co-signature Inter-disciplinary care team collaboration (see longitudinal plan of  care) Comprehensive medication review performed; medication list updated in electronic medical record  Hypertension (BP goal <130/80) -Controlled -Current treatment: Furosemide 20 mg daily as needed: Appropriate, Effective, Safe, Accessible  Metoprolol tartrate 50 mg 1/2 tablet twice daily: Appropriate, Effective, Safe, Accessible  Valsartan 80 mg daily: Appropriate, Effective, Safe, Accessible -Medications previously tried: NA  -Current home readings: 139-149/70-80,  -Office blood pressures often elevated due to white coat hypertension.  -Current dietary habits: trying to limit salt, greasy/fried foods. -Current exercise habits: Continues with rehab once weekly. Walks a few times weekly weather permitting.  -Denies hypotensive/hypertensive symptoms.  -Recommended to continue current medication  Hyperlipidemia: (LDL goal < 70) -Controlled -Stroke 2020 -Current treatment: Lovaza 2g twice daily  Rosuvastatin 20 mg daily  -Medications previously tried: NA  -Recommended to continue current medication  Atrial Fibrillation (Goal: prevent stroke and major bleeding) -Controlled -CHADSVASC: 4 -Current treatment: Rate control: Metoprolol tartrate 50 mg twice daily  Anticoagulation: None  -Medications previously tried: NA -Given history of recent GI bleed, patient will have extensive discussion with Cardiology regarding risks vs benefits of resuming Xarelto. He has a follow-up in 3 weeks with Cardiology.  -Recommended to continue current medication  Depression/Anxiety/Insomnia(Goal: Maintain stable mood) -Controlled -Counseling with Dr. Bosie Clos  -Current  treatment: Amitriptyline 25 mg nightly as needed Wellbutrin XL 150 mg daily  Buspirone 10 mg twice daily (Often forgets PM dose)  -Medications previously tried/failed: NA -Recommended to continue current medication  BPH (Goal: Minimize symptoms) -Controlled -Current treatment  Tamsulosin 0.4 mg daily: Appropriate, Effective,  Safe, Accessible -Medications previously tried: NA -More urinary frequency throughout the day.  Mild improvement in symptoms since starting tamsulosin. Still has to use the restroom every 2-3 hours.  -Recommended to continue current medication  Patient Goals/Self-Care Activities Patient will:  - check blood pressure weekly, document, and provide at future appointments target a minimum of 150 minutes of moderate intensity exercise weekly  Follow Up Plan: Telephone follow up appointment with care management team member scheduled for:  01/18/23 at 11:00 AM    Patient agreed to services and verbal consent obtained.   Patient verbalizes understanding of instructions and care plan provided today and agrees to view in MyChart. Active MyChart status and patient understanding of how to access instructions and care plan via MyChart confirmed with patient.     Cheyenne Adas, CPP Clinical Pharmacist Practitioner  [facility] [phone]

## 2022-07-21 NOTE — Therapy (Signed)
OUTPATIENT PHYSICAL THERAPY TREATMENT   Patient Name: Tom Underwood MRN: [id number] DOB:July 01, 1951, 71 y.o., male Today's Date: 07/21/2022  PCP: Steele Sizer, MD REFERRING PROVIDER: Myles Gip, DO   PT End of Session - 07/21/22 1120     Visit Number 11    Number of Visits 13    Date for PT Re-Evaluation 08/03/22    PT Start Time 1114    Activity Tolerance Patient tolerated treatment well;Patient limited by fatigue    Behavior During Therapy [facility] for tasks assessed/performed             1114 to 1204 (50 minutes).     Past Medical History:  Diagnosis Date   Anxiety    Arthritis    Atrial fibrillation ([place])    a.) CHA2DS2-VASc = 4 (age, HTN, CVA x2). b.) s/p 200 J synchronized cardioversion (DCCV) on 12/08/2021. c.) rate/rythum maintained on oral metoprolol tartrate; chronically anticoagulated using rivaroxaban.   BPH (benign prostatic hyperplasia)    Complication of anesthesia    a.)  Intolerant of propofol; causes "skin burning and involuntary muscle twitching"   DDD (degenerative disc disease), lumbar    Decreased libido    Depression    Diverticulitis    Diverticulosis    GERD (gastroesophageal reflux disease)    Hepatic steatosis    History of 2019 novel coronavirus disease (COVID-19) 12/28/2020   History of cardiac murmur in childhood    History of kidney stones    HLD (hyperlipidemia)    HLD (hyperlipidemia)    Hypertension    Hypogonadism in male    Long term current use of anticoagulant    a.) rivaroxaban   OSA on CPAP    Pre-diabetes    Rhinitis, allergic    Stroke ([place]) 07/23/2019   a.) LEFT posterior cor radiata. b.) residual RIGHT sided weakness   Past Surgical History:  Procedure Laterality Date   CARDIOVERSION N/A 12/08/2021   Procedure: CARDIOVERSION;  Surgeon: Nelva Bush, MD;  Location: [facility] ORS;  Service: Cardiovascular;  Laterality: N/A;   COLONOSCOPY     EVALUATION UNDER ANESTHESIA WITH HEMORRHOIDECTOMY N/A 04/15/2022    Procedure: EXAM UNDER ANESTHESIA WITH HEMORRHOIDECTOMY;  Surgeon: Benjamine Sprague, DO;  Location: [facility] ORS;  Service: General;  Laterality: N/A;   EXTERNAL EAR SURGERY     EYE SURGERY     FINGER SURGERY Right    lateration to 5 th digit   KNEE SURGERY     left eye     RECTAL EXAM UNDER ANESTHESIA N/A 04/23/2022   Procedure: RECTAL EXAM UNDER ANESTHESIA WITH LIGATION OF BLEEDING;  Surgeon: Olean Ree, MD;  Location: [facility] ORS;  Service: General;  Laterality: N/A;   Patient Active Problem List   Diagnosis Date Noted   Gastroesophageal reflux disease without esophagitis 05/17/2022   Dysthymia 05/17/2022   History of syncope 05/17/2022   History of hemorrhoidectomy 05/17/2022   Vitamin D deficiency 05/17/2022   Lower extremity edema 05/17/2022   History of rectal bleeding 05/17/2022   Bleeding internal hemorrhoids    Morbid obesity ([place])    Persistent atrial fibrillation ([place]) 07/14/2020   Hypertension    Hemiparesis of right dominant side as late effect of cerebral infarction ([place]) 08/13/2019   History of kidney stones 05/02/2018   ED (erectile dysfunction) 05/02/2018   Hyperglycemia 01/30/2017   Arthritis of knee, degenerative 01/30/2017   Benign prostatic hyperplasia without lower urinary tract symptoms 02/03/2016   Dyslipidemia 08/04/2015   Diverticulitis of large intestine without perforation or  abscess without bleeding 06/30/2015   OSA on CPAP 06/30/2015    REFERRING DIAG: M25.511,M25.512 (ICD-10-CM) - Bilateral shoulder pain, unspecified chronicity M17.2 (ICD-10-CM) - Post-traumatic osteoarthritis of both knees  R26.81 (ICD-10-CM) - Gait instability    THERAPY DIAG:  Difficulty in walking, not elsewhere classified  Muscle weakness (generalized)  Other abnormalities of gait and mobility  Unsteadiness on feet  Abnormality of gait and mobility  Unspecified lack of coordination  Decreased range of motion of right shoulder  PERTINENT HISTORY: See  evaluation  PRECAUTIONS: Fall  SUBJECTIVE:  Pt. States B knee pain remains around a 8-9/10 but reports he is feeling better.  Pt. Entered PT with marked swelling in B lower legs but esp. In R LE.    PAIN:  Are you having pain? Yes: NPRS scale: 8-9/10 Pain location: B knee Pain description: aching Aggravating factors: prolonged standing/ walking Relieving factors: rest    TODAY'S TREATMENT:   07/21/22:  Nustep L5 10 min. B UE/LE.  Discussed knee pain/ lower leg swelling.  O2 sat: 95%/ HR: 79 bpm.    Nautilus: standing lat. Pull downs 50#/ tricep ex. With bar 40# 30x each.  Good shoulder flexion stretches during lat. Ex.   Seated rest break after completion of Nautilus.   High marching in //- bars with upright posture 3 laps.  Lateral walking in //-bars with no UE assist 3 laps.    Walking in clinic/ hallway with head turns/ cuing to increase BOS/ heel strike.   Discussed daily HEP/ walking at home.      PATIENT EDUCATION: Education details: HEP/ daily activity Person educated: Patient Education method: Customer service manager Education comprehension: verbalized understanding and returned demonstration   HOME EXERCISE PROGRAM: Handouts given for shoulder ROM/ strengthening/ standing hip ex.    PT Short Term Goals -      PT SHORT TERM GOAL #1   Title Pt will be indep with HEP to improve strength and balance to reduce risk of falls.    Baseline 11/23: initiated    Time 4    Period Weeks    Status Achieved              PT Long Term Goals -      PT LONG TERM GOAL #1   Title Pt will improve FOTO score to 50 to display improvement in functional mobility.    Baseline 6/7: 33   Time 12   Period Weeks    Status Initial   Target Date 08/03/22      PT LONG TERM GOAL #2   Title Pt wil improve TUG score to < 12 sec with no AD to display significant imporvement in reduced risk of falls    Baseline 11/23: 26 sec (did not sit down immediately leading to higher  time than he would've scored) 1/19: 14.44 sec.   6/7: 12.66 sec.    Time 12    Period Weeks    Status Partially Met    Target Date 08/03/22      PT LONG TERM GOAL #3   Title Pt will improve DGI to >19/24 to indicate decreased risk of falls with household and community walking tasks    Baseline 11/23: 11/24 1/19:12/24    Time 12    Period Weeks    Status Partially Met    Target Date 08/03/22      PT LONG TERM GOAL #4   Title Pt. Will increase R hip flexion/ LE muscle strength 1/2 muscle grade  to improve standing tolerance/ walking endurance.    Baseline See above   Time 12    Period Weeks    Status Partially Met    Target Date 08/03/22              Plan -     Clinical Impression Statement Pt. Continues to have significant swelling in B lower legs and pitting edema.  Pt. Is continuing POC with focus on LE strengthening and cardiovascular endurance. Pt. Works hard during tx. Session with prolonged standing/ walking tasks with few seated rest breaks today. 3 seated rest breaks required during walking/ there.ex.  Pt will continue to benefit from skilled PT services to progress strength and mobility, along with cardiovascular endurance.    Personal Factors and Comorbidities Comorbidity 2    Comorbidities Hypertension, Obesity    Examination-Activity Limitations Bed Mobility;Reach Overhead;Squat;Lift;Dressing;Hygiene/Grooming    Examination-Participation Restrictions Community Activity;Yard Work    Merchant [employer] officer Evolving/Moderate complexity    Clinical Decision Making Moderate    Rehab Potential Fair    PT Frequency 1x / week    PT Duration 12 weeks    PT Treatment/Interventions ADLs/Self Care Home Management;Gait training;Stair training;Functional mobility training;Therapeutic activities;Therapeutic exercise;Balance training;Neuromuscular re-education;Manual techniques;Patient/family education;Electrical Stimulation;Moist Heat;Cryotherapy    PT Next Visit Plan  LE muscle strengthening/ endurance training.    PT Home Exercise Plan Shoulder pulleys in flex, scaption, abd, pendelums, scap retractions with resistance (RTB), D2 flexion pattern in sitting no resistance.    Consulted and Agree with Plan of Care Patient          Pura Spice, PT, DPT # [id number] 07/21/2022, 11:22 AM

## 2022-07-25 ENCOUNTER — Ambulatory Visit
Admission: RE | Admit: 2022-07-25 | Discharge: 2022-07-25 | Disposition: A | Discharge status: Home / Self Care | Payer: Medicare Other | Source: Ambulatory Visit | Attending: Urology | Admitting: Urology

## 2022-07-25 ENCOUNTER — Other Ambulatory Visit: Admission: RE | Admit: 2022-07-25 | Payer: Medicare Other | Source: Home / Self Care | Admitting: Urology

## 2022-07-25 ENCOUNTER — Encounter: Payer: Self-pay | Admitting: Urology

## 2022-07-25 ENCOUNTER — Ambulatory Visit
Admission: RE | Admit: 2022-07-25 | Discharge: 2022-07-25 | Disposition: A | Discharge status: Home / Self Care | Payer: Medicare Other | Attending: Urology | Admitting: Urology

## 2022-07-25 ENCOUNTER — Ambulatory Visit (INDEPENDENT_AMBULATORY_CARE_PROVIDER_SITE_OTHER): Payer: Medicare Other | Admitting: Urology

## 2022-07-25 VITALS — BP 152/71 | HR 89 | Ht 70.0 in | Wt 299.0 lb

## 2022-07-25 DIAGNOSIS — R109 Unspecified abdominal pain: Secondary | ICD-10-CM

## 2022-07-25 DIAGNOSIS — Z87442 Personal history of urinary calculi: Secondary | ICD-10-CM

## 2022-07-25 NOTE — Progress Notes (Signed)
07/25/22 2:31 PM   Tom Underwood [birth year] [id number]  Referring provider:  Alba Cory, MD [address] [place],  [place] [address]  Chief Complaint  Patient presents with   Nephrolithiasis    Follow up    Urological history  1. High risk hematuria -former smoker -CTU 2017 NED -cysto 2017 NED -no reports of gross hematuria   2. Nephrolithiasis -spontaneous passage of a left UPJ stone in 2014 -1 mm stone in the left kidney 2017 CTU - KUB on 07/04/2022 visualized punctate bilateral kidney stones measuring up to 2 mm on the right and 5 mm on the left. No pelvic calcifications   3. BPH with LU TS -PSA 0.3 07/01/2022   4. ED -contributing factors of age, BPH, HTN, HLD, stroke and sleep apnea   HPI: Tom Underwood is a 71 y.o.male who presents for further evaluation of nephrolithiasis.   KUB today was personally reviewed and interpreted. His KUB is unchanged from previous KUB on 07/04/2022.   He reports left sided flank pain. He reports that he underwent physical therapy when he was using a walker after his stroke.   He reports that his brother recently had a right nephrectomy for kidney cancer.   PMH: Past Medical History:  Diagnosis Date   Anxiety    Arthritis    Atrial fibrillation ([facility])    a.) CHA2DS2-VASc = 4 (age, HTN, CVA x2). b.) s/p 200 J synchronized cardioversion (DCCV) on 12/08/2021. c.) rate/rythum maintained on oral metoprolol tartrate; chronically anticoagulated using rivaroxaban.   BPH (benign prostatic hyperplasia)    Complication of anesthesia    a.)  Intolerant of propofol; causes "skin burning and involuntary muscle twitching"   DDD (degenerative disc disease), lumbar    Decreased libido    Depression    Diverticulitis    Diverticulosis    GERD (gastroesophageal reflux disease)    Hepatic steatosis    History of 2019 novel coronavirus disease (COVID-19) 12/28/2020   History of cardiac murmur in childhood    History of  kidney stones    HLD (hyperlipidemia)    HLD (hyperlipidemia)    Hypertension    Hypogonadism in male    Long term current use of anticoagulant    a.) rivaroxaban   OSA on CPAP    Pre-diabetes    Rhinitis, allergic    Stroke ([facility]) 07/23/2019   a.) LEFT posterior cor radiata. b.) residual RIGHT sided weakness    Surgical History: Past Surgical History:  Procedure Laterality Date   CARDIOVERSION N/A 12/08/2021   Procedure: CARDIOVERSION;  Surgeon: Yvonne Kendall, MD;  Location: [facility];  Service: Cardiovascular;  Laterality: N/A;   COLONOSCOPY     EVALUATION UNDER ANESTHESIA WITH HEMORRHOIDECTOMY N/A 04/15/2022   Procedure: EXAM UNDER ANESTHESIA WITH HEMORRHOIDECTOMY;  Surgeon: Sung Amabile, DO;  Location: [facility] OR[facility];  Service: General;  Laterality: N/A;   EXTERNAL EAR SURGERY     EYE SURGERY     FINGER SURGERY Right    lateration to 5 th digit   KNEE SURGERY     left eye     RECTAL EXAM UNDER ANESTHESIA N/A 04/23/2022   Procedure: RECTAL EXAM UNDER ANESTHESIA WITH LIGATION OF BLEEDING;  Surgeon: Henrene Dodge, MD;  Location: [facility] OR[facility];  Service: General;  Laterality: N/A;    Home Medications:  Allergies as of 07/25/2022       Reactions   Penicillins Itching        Medication List  Accurate as of July 25, 2022  2:31 PM. If you have any questions, ask your nurse or doctor.          STOP taking these medications    cetirizine 10 MG tablet Commonly known as: ZYRTEC Stopped by: Michiel Cowboy, PA-C       TAKE these medications    amitriptyline 25 MG tablet Commonly known as: ELAVIL TAKE 1 TABLET BY MOUTH AT BEDTIME AS NEEDED FOR SLEEP What changed:  reasons to take this additional instructions   buPROPion 150 MG 24 hr tablet Commonly known as: WELLBUTRIN XL Take 1 tablet (150 mg total) by mouth every morning.   busPIRone 10 MG tablet Commonly known as: BUSPAR Take 1 tablet (10 mg total) by mouth 2 (two) times daily.   docusate sodium 100 MG  capsule Commonly known as: COLACE Take 100 mg by mouth daily.   fluticasone 50 MCG/ACT nasal spray Commonly known as: FLONASE Place 2 sprays into both nostrils daily.   furosemide 20 MG tablet Commonly known as: LASIX Take 1 tablet (20 mg total) by mouth as needed. For lower extremity swelling.   GLUCOSAMINE-CHONDROITIN PO Take 2 tablets by mouth in the morning and at bedtime.   lidocaine 5 % ointment Commonly known as: XYLOCAINE Apply 1 application. topically 3 (three) times daily as needed. Apply pea size amount to area as needed   methocarbamol 500 MG tablet Commonly known as: ROBAXIN Take 500 mg by mouth every 8 (eight) hours as needed for muscle spasms.   metoprolol tartrate 50 MG tablet Commonly known as: LOPRESSOR Take 0.5 tablets (25 mg total) by mouth 2 (two) times daily.   multivitamin tablet Take 1 tablet by mouth daily.   omega-3 acid ethyl esters 1 g capsule Commonly known as: LOVAZA Take 2 capsules (2 g total) by mouth 2 (two) times daily.   pantoprazole 20 MG tablet Commonly known as: PROTONIX Take 1 tablet (20 mg total) by mouth daily.   Probiotic 250 MG Caps Take 1 capsule by mouth daily.   rosuvastatin 20 MG tablet Commonly known as: CRESTOR Take 1 tablet (20 mg total) by mouth daily. What changed: when to take this   sildenafil 50 MG tablet Commonly known as: VIAGRA   tamsulosin 0.4 MG Caps capsule Commonly known as: FLOMAX Take 1 capsule (0.4 mg total) by mouth daily.   valsartan 80 MG tablet Commonly known as: DIOVAN Take 80 mg by mouth daily.   Visine Dry Eye Relief 1 % Soln Generic drug: Polyethylene Glycol 400 Place 1 drop into both eyes daily as needed (dry eyes).   Vitamin D 125 MCG (5000 UT) Caps Take 5,000 Units by mouth daily.   Xarelto 20 MG Tabs tablet Generic drug: rivaroxaban Take 20 mg by mouth daily with supper.        Allergies:  Allergies  Allergen Reactions   Penicillins Itching    Family  History: Family History  Problem Relation Age of Onset   Asthma Mother    Heart disease Mother    Heart attack Mother    Dementia Mother    Asthma Father    Heart disease Father    Stroke Father     Social History:  reports that he quit smoking about 48 years ago. His smoking use included pipe. He started smoking about 49 years ago. He has never used smokeless tobacco. He reports that he does not drink alcohol and does not use drugs.   Physical Exam: BP (!) 152/71  Pulse 89   Ht 5\' 10"  (1.778 m)   Wt 299 lb (135.6 kg)   BMI 42.90 kg/m   Constitutional:  Alert and oriented, No acute distress. HEENT: [place] AT, moist mucus membranes.  Trachea midline Cardiovascular: No clubbing, cyanosis, or edema. Respiratory: Normal respiratory effort, no increased work of breathing. Skin: No rashes, bruises or suspicious lesions. Neurologic: Grossly intact, no focal deficits, moving all 4 extremities. Psychiatric: Normal mood and affect.  Laboratory Data: N/A     Pertinent Imaging: KUB was personally reviewed and interpreted. See HPI.  Radiology interpretation pending.   Assessment & Plan:    Left flank pain  - KUB unchanged and stable from previous imaging  - Likely musculoskeletal in nature. Advised him to follow-up with his PCP for degenerative disc disease and see physical therapy.   Return in about 1 year (around 07/26/2023) for UA, KUB,  PSA, I PSS and exam.  [facility] [address], [address] [place], [place] [phone]  I, [phone] Littlejohn,acting as a scribe for [facility], PA-C.,have documented all relevant documentation on the behalf of Charlynn Salih, PA-C,as directed by  [facility], PA-C while in the presence of Daiton Cowles, PA-C.  I have reviewed the above documentation for accuracy and completeness, and I agree with the above.    [facility], PA-C

## 2022-07-28 ENCOUNTER — Ambulatory Visit: Payer: Medicare Other | Admitting: Physical Therapy

## 2022-07-28 ENCOUNTER — Encounter: Payer: Self-pay | Admitting: Physical Therapy

## 2022-07-28 DIAGNOSIS — M6281 Muscle weakness (generalized): Secondary | ICD-10-CM

## 2022-07-28 DIAGNOSIS — R262 Difficulty in walking, not elsewhere classified: Secondary | ICD-10-CM

## 2022-07-28 DIAGNOSIS — R269 Unspecified abnormalities of gait and mobility: Secondary | ICD-10-CM | POA: Diagnosis not present

## 2022-07-28 DIAGNOSIS — R2681 Unsteadiness on feet: Secondary | ICD-10-CM | POA: Diagnosis not present

## 2022-07-28 DIAGNOSIS — R279 Unspecified lack of coordination: Secondary | ICD-10-CM

## 2022-07-28 DIAGNOSIS — R2689 Other abnormalities of gait and mobility: Secondary | ICD-10-CM | POA: Diagnosis not present

## 2022-07-28 NOTE — Therapy (Addendum)
OUTPATIENT PHYSICAL THERAPY TREATMENT   Patient Name: Tom Underwood MRN: [id number] DOB:02-Nov-1951, 71 y.o., male Today's Date: 07/28/2022  PCP: Steele Sizer, MD REFERRING PROVIDER: Myles Gip, DO   PT End of Session - 07/28/22 1114     Visit Number 12    Number of Visits 13    Date for PT Re-Evaluation 08/03/22    PT Start Time 1114    PT Stop Time 1202    PT Time Calculation (min) 48 min    Activity Tolerance Patient tolerated treatment well;Patient limited by fatigue    Behavior During Therapy [facility] for tasks assessed/performed             Past Medical History:  Diagnosis Date   Anxiety    Arthritis    Atrial fibrillation ([place])    a.) CHA2DS2-VASc = 4 (age, HTN, CVA x2). b.) s/p 200 J synchronized cardioversion (DCCV) on 12/08/2021. c.) rate/rythum maintained on oral metoprolol tartrate; chronically anticoagulated using rivaroxaban.   BPH (benign prostatic hyperplasia)    Complication of anesthesia    a.)  Intolerant of propofol; causes "skin burning and involuntary muscle twitching"   DDD (degenerative disc disease), lumbar    Decreased libido    Depression    Diverticulitis    Diverticulosis    GERD (gastroesophageal reflux disease)    Hepatic steatosis    History of 2019 novel coronavirus disease (COVID-19) 12/28/2020   History of cardiac murmur in childhood    History of kidney stones    HLD (hyperlipidemia)    HLD (hyperlipidemia)    Hypertension    Hypogonadism in male    Long term current use of anticoagulant    a.) rivaroxaban   OSA on CPAP    Pre-diabetes    Rhinitis, allergic    Stroke ([place]) 07/23/2019   a.) LEFT posterior cor radiata. b.) residual RIGHT sided weakness   Past Surgical History:  Procedure Laterality Date   CARDIOVERSION N/A 12/08/2021   Procedure: CARDIOVERSION;  Surgeon: Nelva Bush, MD;  Location: [facility] OR[facility];  Service: Cardiovascular;  Laterality: N/A;   COLONOSCOPY     EVALUATION UNDER ANESTHESIA WITH  HEMORRHOIDECTOMY N/A 04/15/2022   Procedure: EXAM UNDER ANESTHESIA WITH HEMORRHOIDECTOMY;  Surgeon: Benjamine Sprague, DO;  Location: [facility];  Service: General;  Laterality: N/A;   EXTERNAL EAR SURGERY     EYE SURGERY     FINGER SURGERY Right    lateration to 5 th digit   KNEE SURGERY     left eye     RECTAL EXAM UNDER ANESTHESIA N/A 04/23/2022   Procedure: RECTAL EXAM UNDER ANESTHESIA WITH LIGATION OF BLEEDING;  Surgeon: Olean Ree, MD;  Location: [facility] ORS;  Service: General;  Laterality: N/A;   Patient Active Problem List   Diagnosis Date Noted   Gastroesophageal reflux disease without esophagitis 05/17/2022   Dysthymia 05/17/2022   History of syncope 05/17/2022   History of hemorrhoidectomy 05/17/2022   Vitamin D deficiency 05/17/2022   Lower extremity edema 05/17/2022   History of rectal bleeding 05/17/2022   Bleeding internal hemorrhoids    Morbid obesity ([place])    Persistent atrial fibrillation ([place]) 07/14/2020   Hypertension    Hemiparesis of right dominant side as late effect of cerebral infarction ([place]) 08/13/2019   History of kidney stones 05/02/2018   ED (erectile dysfunction) 05/02/2018   Hyperglycemia 01/30/2017   Arthritis of knee, degenerative 01/30/2017   Benign prostatic hyperplasia without lower urinary tract symptoms 02/03/2016   Dyslipidemia 08/04/2015  Diverticulitis of large intestine without perforation or abscess without bleeding 06/30/2015   OSA on CPAP 06/30/2015    REFERRING DIAG: M25.511,M25.512 (ICD-10-CM) - Bilateral shoulder pain, unspecified chronicity M17.2 (ICD-10-CM) - Post-traumatic osteoarthritis of both knees  R26.81 (ICD-10-CM) - Gait instability    THERAPY DIAG:  Difficulty in walking, not elsewhere classified  Muscle weakness (generalized)  Other abnormalities of gait and mobility  Unsteadiness on feet  Abnormality of gait and mobility  Unspecified lack of coordination  PERTINENT HISTORY: See evaluation  PRECAUTIONS:  Fall  SUBJECTIVE:  07/28/22:  Pt. States B knee pain remains around a 8-9/10 and pt. Is still waiting on compression stockings to arrive.  Pt. Entered PT with marked swelling in B lower legs but esp. In R LE.  Pt. Had a recent f/u with MD to discuss flank pain (probable muscle strain/ not kidney related).    PAIN:  Are you having pain? Yes: NPRS scale: 8-9/10 Pain location: B knee Pain description: aching Aggravating factors: prolonged standing/ walking Relieving factors: rest    TODAY'S TREATMENT:   07/28/22:  Sled push/pull 20# in hallway 2x.    Nustep L5 10 min. B UE/LE.  Discussed knee pain/ lower leg swelling.    Standing at stairs with alt. LE touches on 6" step (no UE assist) and step ups L/R 10x each with light to no UE assist.  Moderate LE muscle fatigue noted.    Walking in clinic/ hallway with focus on step pattern/ heel strike and arm swing.  No LOB but slow cadence.  Pt. Discussed use of rollator with PT and pros/cons.     Discussed daily HEP/ walking at home.      PATIENT EDUCATION: Education details: HEP/ daily activity Person educated: Patient Education method: Customer service manager Education comprehension: verbalized understanding and returned demonstration   HOME EXERCISE PROGRAM: Handouts given for shoulder ROM/ strengthening/ standing hip ex.    PT Short Term Goals -      PT SHORT TERM GOAL #1   Title Pt will be indep with HEP to improve strength and balance to reduce risk of falls.    Baseline 11/23: initiated    Time 4    Period Weeks    Status Achieved              PT Long Term Goals -      PT LONG TERM GOAL #1   Title Pt will improve FOTO score to 50 to display improvement in functional mobility.    Baseline 6/7: 33   Time 12   Period Weeks    Status Initial   Target Date 08/03/22      PT LONG TERM GOAL #2   Title Pt wil improve TUG score to < 12 sec with no AD to display significant imporvement in reduced risk of falls     Baseline 11/23: 26 sec (did not sit down immediately leading to higher time than he would've scored) 1/19: 14.44 sec.   6/7: 12.66 sec.    Time 12    Period Weeks    Status Partially Met    Target Date 08/03/22      PT LONG TERM GOAL #3   Title Pt will improve DGI to >19/24 to indicate decreased risk of falls with household and community walking tasks    Baseline 11/23: 11/24 1/19:12/24    Time 12    Period Weeks    Status Partially Met    Target Date 08/03/22  PT LONG TERM GOAL #4   Title Pt. Will increase R hip flexion/ LE muscle strength 1/2 muscle grade to improve standing tolerance/ walking endurance.    Baseline See above   Time 12    Period Weeks    Status Partially Met    Target Date 08/03/22              Plan -     Clinical Impression Statement Pt. Continues to have significant swelling in B lower legs and pitting edema.  Pt. Is continuing POC with focus on LE strengthening and cardiovascular endurance. Pt. Works hard during tx. Session with prolonged standing/ walking tasks with few seated rest breaks today. Several seated rest breaks required during walking/ there.ex.  Pt will continue to benefit from skilled PT services to progress strength and mobility, along with cardiovascular endurance.    Personal Factors and Comorbidities Comorbidity 2    Comorbidities Hypertension, Obesity    Examination-Activity Limitations Bed Mobility;Reach Overhead;Squat;Lift;Dressing;Hygiene/Grooming    Examination-Participation Restrictions Community Activity;Yard Work    Merchant [employer] officer Evolving/Moderate complexity    Clinical Decision Making Moderate    Rehab Potential Fair    PT Frequency 1x / week    PT Duration 12 weeks    PT Treatment/Interventions ADLs/Self Care Home Management;Gait training;Stair training;Functional mobility training;Therapeutic activities;Therapeutic exercise;Balance training;Neuromuscular re-education;Manual techniques;Patient/family  education;Electrical Stimulation;Moist Heat;Cryotherapy    PT Next Visit Plan CHECK GOALS/ RECERT/ SCHEDULE   PT Home Exercise Plan Shoulder pulleys in flex, scaption, abd, pendelums, scap retractions with resistance (RTB), D2 flexion pattern in sitting no resistance.    Consulted and Agree with Plan of Care Patient          Pura Spice, PT, DPT # [phone] 07/28/2022, 12:45 PM

## 2022-08-01 ENCOUNTER — Ambulatory Visit: Payer: Medicare Other | Admitting: Physical Therapy

## 2022-08-01 ENCOUNTER — Encounter: Payer: Self-pay | Admitting: Physical Therapy

## 2022-08-01 DIAGNOSIS — M25611 Stiffness of right shoulder, not elsewhere classified: Secondary | ICD-10-CM

## 2022-08-01 DIAGNOSIS — M6281 Muscle weakness (generalized): Secondary | ICD-10-CM

## 2022-08-01 DIAGNOSIS — R2681 Unsteadiness on feet: Secondary | ICD-10-CM | POA: Diagnosis not present

## 2022-08-01 DIAGNOSIS — R279 Unspecified lack of coordination: Secondary | ICD-10-CM | POA: Diagnosis not present

## 2022-08-01 DIAGNOSIS — R2689 Other abnormalities of gait and mobility: Secondary | ICD-10-CM | POA: Diagnosis not present

## 2022-08-01 DIAGNOSIS — R269 Unspecified abnormalities of gait and mobility: Secondary | ICD-10-CM

## 2022-08-01 DIAGNOSIS — R262 Difficulty in walking, not elsewhere classified: Secondary | ICD-10-CM

## 2022-08-01 NOTE — Therapy (Addendum)
OUTPATIENT PHYSICAL THERAPY TREATMENT   Patient Name: Tom Underwood MRN: [id number] DOB:Apr 27, 1951, 71 y.o., male Today's Date: 08/01/2022  PCP: Steele Sizer, MD REFERRING PROVIDER: Myles Gip, DO   PT End of Session - 08/01/22 1251     Visit Number 13    Number of Visits 13    Date for PT Re-Evaluation 08/03/22    PT Start Time 1251    PT Stop Time 1348    PT Time Calculation (min) 57 min    Activity Tolerance Patient tolerated treatment well;Patient limited by fatigue    Behavior During Therapy [facility] for tasks assessed/performed             Past Medical History:  Diagnosis Date   Anxiety    Arthritis    Atrial fibrillation ([place])    a.) CHA2DS2-VASc = 4 (age, HTN, CVA x2). b.) s/p 200 J synchronized cardioversion (DCCV) on 12/08/2021. c.) rate/rythum maintained on oral metoprolol tartrate; chronically anticoagulated using rivaroxaban.   BPH (benign prostatic hyperplasia)    Complication of anesthesia    a.)  Intolerant of propofol; causes "skin burning and involuntary muscle twitching"   DDD (degenerative disc disease), lumbar    Decreased libido    Depression    Diverticulitis    Diverticulosis    GERD (gastroesophageal reflux disease)    Hepatic steatosis    History of 2019 novel coronavirus disease (COVID-19) 12/28/2020   History of cardiac murmur in childhood    History of kidney stones    HLD (hyperlipidemia)    HLD (hyperlipidemia)    Hypertension    Hypogonadism in male    Long term current use of anticoagulant    a.) rivaroxaban   OSA on CPAP    Pre-diabetes    Rhinitis, allergic    Stroke ([place]) 07/23/2019   a.) LEFT posterior cor radiata. b.) residual RIGHT sided weakness   Past Surgical History:  Procedure Laterality Date   CARDIOVERSION N/A 12/08/2021   Procedure: CARDIOVERSION;  Surgeon: Nelva Bush, MD;  Location: [facility] ORS;  Service: Cardiovascular;  Laterality: N/A;   COLONOSCOPY     EVALUATION UNDER ANESTHESIA WITH  HEMORRHOIDECTOMY N/A 04/15/2022   Procedure: EXAM UNDER ANESTHESIA WITH HEMORRHOIDECTOMY;  Surgeon: Benjamine Sprague, DO;  Location: [facility] ORS;  Service: General;  Laterality: N/A;   EXTERNAL EAR SURGERY     EYE SURGERY     FINGER SURGERY Right    lateration to 5 th digit   KNEE SURGERY     left eye     RECTAL EXAM UNDER ANESTHESIA N/A 04/23/2022   Procedure: RECTAL EXAM UNDER ANESTHESIA WITH LIGATION OF BLEEDING;  Surgeon: Olean Ree, MD;  Location: [facility] ORS;  Service: General;  Laterality: N/A;   Patient Active Problem List   Diagnosis Date Noted   Gastroesophageal reflux disease without esophagitis 05/17/2022   Dysthymia 05/17/2022   History of syncope 05/17/2022   History of hemorrhoidectomy 05/17/2022   Vitamin D deficiency 05/17/2022   Lower extremity edema 05/17/2022   History of rectal bleeding 05/17/2022   Bleeding internal hemorrhoids    Morbid obesity ([place])    Persistent atrial fibrillation ([place]) 07/14/2020   Hypertension    Hemiparesis of right dominant side as late effect of cerebral infarction ([place]) 08/13/2019   History of kidney stones 05/02/2018   ED (erectile dysfunction) 05/02/2018   Hyperglycemia 01/30/2017   Arthritis of knee, degenerative 01/30/2017   Benign prostatic hyperplasia without lower urinary tract symptoms 02/03/2016   Dyslipidemia 08/04/2015  Diverticulitis of large intestine without perforation or abscess without bleeding 06/30/2015   OSA on CPAP 06/30/2015    REFERRING DIAG: M25.511,M25.512 (ICD-10-CM) - Bilateral shoulder pain, unspecified chronicity M17.2 (ICD-10-CM) - Post-traumatic osteoarthritis of both knees  R26.81 (ICD-10-CM) - Gait instability    THERAPY DIAG:  Difficulty in walking, not elsewhere classified  Muscle weakness (generalized)  Other abnormalities of gait and mobility  Unsteadiness on feet  Abnormality of gait and mobility  Unspecified lack of coordination  Decreased range of motion of right shoulder  PERTINENT  HISTORY: See evaluation  PRECAUTIONS: Fall  SUBJECTIVE:  08/01/22:  Pt. States B knee pain remains high and pt. Reports he was really stiff/ sore over the weekend.  Pt. Still waiting on lower leg compression stockings to arrive.  Pt. Entered PT with marked swelling in B lower legs but esp. In R LE.    PAIN:  Are you having pain? Yes: NPRS scale: 10/10 Pain location: B knee Pain description: aching Aggravating factors: prolonged standing/ walking Relieving factors: rest    TODAY'S TREATMENT:   08/01/22:  Nustep L5 10 min. B UE/LE.  Discussed knee pain/ lower leg swelling.    Walking in clinic/ hallway with use of rollator (marked increase in cadence as compared to use of SPC) and cuing to decrease UE assist on rollator.  No LOB.  Pt. Has a slow cadence with SPC, no LOB.    Walking in //-bars with high marching/ lateral walking (4 laps each).  Seated rest break required after 4 laps due to fatigue.      Standing at stairs with alt. LE touches on 6" step (no UE assist) and step ups L/R 10x each with light to no UE assist.  Moderate LE muscle fatigue noted.      Walking outside with rollator and cuing for proper technique/ posture.  No LOB.  Pt. Able to amb. From gym to car and then back to bathroom in hallway and then to car.  Fatigue.    Discussed daily HEP/ walking at home.      PATIENT EDUCATION: Education details: HEP/ daily activity Person educated: Patient Education method: Customer service manager Education comprehension: verbalized understanding and returned demonstration   HOME EXERCISE PROGRAM: Handouts given for shoulder ROM/ strengthening/ standing hip ex.    PT Short Term Goals -      PT SHORT TERM GOAL #1   Title Pt will be indep with HEP to improve strength and balance to reduce risk of falls.    Baseline 11/23: initiated    Time 4    Period Weeks    Status Achieved              PT Long Term Goals -      PT LONG TERM GOAL #1   Title Pt  will improve FOTO score to 50 to display improvement in functional mobility.    Baseline 6/7: 33   Time 12   Period Weeks    Status Initial   Target Date 08/03/22      PT LONG TERM GOAL #2   Title Pt wil improve TUG score to < 12 sec with no AD to display significant imporvement in reduced risk of falls    Baseline 11/23: 26 sec (did not sit down immediately leading to higher time than he would've scored) 1/19: 14.44 sec.   6/7: 12.66 sec.    Time 12    Period Weeks    Status Partially Met  Target Date 08/03/22      PT LONG TERM GOAL #3   Title Pt will improve DGI to >19/24 to indicate decreased risk of falls with household and community walking tasks    Baseline 11/23: 11/24 1/19:12/24    Time 12    Period Weeks    Status Partially Met    Target Date 08/03/22      PT LONG TERM GOAL #4   Title Pt. Will increase R hip flexion/ LE muscle strength 1/2 muscle grade to improve standing tolerance/ walking endurance.    Baseline See above   Time 12    Period Weeks    Status Partially Met    Target Date 08/03/22              Plan -     Clinical Impression Statement Pt. Continues to have significant swelling in B lower legs and pitting edema.   PT treatment tried use of rollator to improve walking endurance/ distance and promote safety.  Marked increase in cadence with use of rollator as compared to [facility] use.  Pt. Is continuing POC with focus on LE strengthening and cardiovascular endurance. Pt. Works hard during tx. Session with prolonged standing/ walking tasks with few seated rest breaks today. Several seated rest breaks required during walking/ there.ex.  Pt will continue to benefit from skilled PT services to progress strength and mobility, along with cardiovascular endurance.    Personal Factors and Comorbidities Comorbidity 2    Comorbidities Hypertension, Obesity    Examination-Activity Limitations Bed Mobility;Reach Overhead;Squat;Lift;Dressing;Hygiene/Grooming     Examination-Participation Restrictions Community Activity;Yard Work    Merchant navy officer Evolving/Moderate complexity    Clinical Decision Making Moderate    Rehab Potential Fair    PT Frequency 1x / week    PT Duration 12 weeks    PT Treatment/Interventions ADLs/Self Care Home Management;Gait training;Stair training;Functional mobility training;Therapeutic activities;Therapeutic exercise;Balance training;Neuromuscular re-education;Manual techniques;Patient/family education;Electrical Stimulation;Moist Heat;Cryotherapy    PT Next Visit Plan CHECK GOALS/ RECERT/ SCHEDULE next tx.   PT Home Exercise Plan Shoulder pulleys in flex, scaption, abd, pendelums, scap retractions with resistance (RTB), D2 flexion pattern in sitting no resistance.    Consulted and Agree with Plan of Care Patient          Pura Spice, PT, DPT # [phone] 08/01/2022, 2:18 PM

## 2022-08-03 ENCOUNTER — Encounter: Payer: Medicare Other | Admitting: Physical Therapy

## 2022-08-03 DIAGNOSIS — H26492 Other secondary cataract, left eye: Secondary | ICD-10-CM | POA: Diagnosis not present

## 2022-08-04 ENCOUNTER — Telehealth: Payer: Self-pay

## 2022-08-04 DIAGNOSIS — I4891 Unspecified atrial fibrillation: Secondary | ICD-10-CM | POA: Diagnosis not present

## 2022-08-04 DIAGNOSIS — E785 Hyperlipidemia, unspecified: Secondary | ICD-10-CM

## 2022-08-04 DIAGNOSIS — I10 Essential (primary) hypertension: Secondary | ICD-10-CM

## 2022-08-04 DIAGNOSIS — N400 Enlarged prostate without lower urinary tract symptoms: Secondary | ICD-10-CM | POA: Diagnosis not present

## 2022-08-04 NOTE — Telephone Encounter (Signed)
Spoke with patient he states he is also having frequency and urgency. Added on to schedule tomorrow to rule out UTI

## 2022-08-04 NOTE — Telephone Encounter (Signed)
Incoming call on triage line from pts wife stating pt had blood in his urine last night. Pt describes it as the color of tea. He states this is the only episode of hematuria he has had since he seen [facility] last. Other symptoms include increase in frequency and intermittent lower right sided pain in both the front and back. He denies f/c/n/v. Wife states they are going out of town next Friday and wants to make sure pt doesn't need to be seen. Advised wife I would send a msg to [employer] and someone would call her back. She expressed understanding.

## 2022-08-05 ENCOUNTER — Ambulatory Visit (INDEPENDENT_AMBULATORY_CARE_PROVIDER_SITE_OTHER): Payer: Medicare Other | Admitting: Physician Assistant

## 2022-08-05 VITALS — BP 165/88 | HR 55

## 2022-08-05 DIAGNOSIS — R310 Gross hematuria: Secondary | ICD-10-CM | POA: Diagnosis not present

## 2022-08-05 DIAGNOSIS — R319 Hematuria, unspecified: Secondary | ICD-10-CM | POA: Diagnosis not present

## 2022-08-05 LAB — URINALYSIS, COMPLETE
Bilirubin, UA: NEGATIVE
Glucose, UA: NEGATIVE
Ketones, UA: NEGATIVE
Leukocytes,UA: NEGATIVE
Nitrite, UA: NEGATIVE
Protein,UA: NEGATIVE
Specific Gravity, UA: 1.01 (ref 1.005–1.030)
Urobilinogen, Ur: 0.2 mg/dL (ref 0.2–1.0)
pH, UA: 6 (ref 5.0–7.5)

## 2022-08-05 LAB — BLADDER SCAN AMB NON-IMAGING

## 2022-08-05 LAB — MICROSCOPIC EXAMINATION: Bacteria, UA: NONE SEEN

## 2022-08-05 NOTE — Progress Notes (Signed)
08/05/2022 1:16 PM   Tom Underwood [birth year] [id number]  CC: Chief Complaint  Patient presents with   Hematuria   HPI: Tom Underwood is a 71 y.o. male with PMH A-fib previously on Xarelto, hematuria with benign work-up in 2017, nephrolithiasis, BPH on Flomax, and ED on sildenafil who presents today for evaluation of gross hematuria.   Today he reports a 2-day history of tea colored urine with increased urgency, frequency, LLQ pain, and left flank pain.  Notably, he has been off Xarelto for about 3 months.  He saw Zara Council in clinic on 07/25/2022, again with reports of left flank pain.  KUB that day with stable appearing bilateral nephrolithiasis.  His pain was felt to be musculoskeletal in nature.  In-office UA today positive for 1+ blood; urine microscopy with 3-10 RBCs/HPF. PVR 158m.  PMH: Past Medical History:  Diagnosis Date   Anxiety    Arthritis    [place]rial [place]    a.) CHA2DS2-VASc = 4 (age, HTN, CVA x2). b.) s/p 200 J synchronized cardioversion (DCCV) on 12/08/2021. c.) rate/rythum maintained on oral metoprolol tartrate; chronically anticoagulated using rivaroxaban.   BPH (benign prostatic hyperplasia)    Complication of anesthesia    a.)  Intolerant of propofol; causes "skin burning and involuntary muscle twitching"   DDD (degenerative disc disease), lumbar    Decreased libido    Depression    Diverticulitis    Diverticulosis    GERD (gastroesophageal reflux disease)    Hepatic steatosis    History of 2019 novel coronavirus disease (COVID-19) 12/28/2020   History of cardiac murmur in childhood    History of kidney stones    HLD (hyperlipidemia)    HLD (hyperlipidemia)    Hypertension    Hypogonadism in male    Long term current use of anticoagulant    a.) rivaroxaban   OSA on CPAP    Pre-diabetes    Rhinitis, allergic    [place]   a.) LEFT posterior cor radiata. b.) residual RIGHT sided weakness    Surgical  History: Past Surgical History:  Procedure Laterality Date   CARDIOVERSION N/A 12/08/2021   Procedure: CARDIOVERSION;  Surgeon: ENelva Bush MD;  Location: [facility] ORS;  Service: Cardiovascular;  Laterality: N/A;   COLONOSCOPY     EVALUATION UNDER ANESTHESIA WITH HEMORRHOIDECTOMY N/A 04/15/2022   Procedure: EXAM UNDER ANESTHESIA WITH HEMORRHOIDECTOMY;  Surgeon: SBenjamine Sprague DO;  Location: [facility] ORS;  Service: General;  Laterality: N/A;   EXTERNAL EAR SURGERY     EYE SURGERY     FINGER SURGERY Right    lateration to 5 th digit   KNEE SURGERY     left eye     RECTAL EXAM UNDER ANESTHESIA N/A 04/23/2022   Procedure: RECTAL EXAM UNDER ANESTHESIA WITH LIGATION OF BLEEDING;  Surgeon: POlean Ree MD;  Location: [facility] ORS;  Service: General;  Laterality: N/A;    Home Medications:  Allergies as of 08/05/2022       Reactions   Penicillins Itching        Medication List        Accurate as of August 05, 2022  1:16 PM. If you have any questions, ask your nurse or doctor.          amitriptyline 25 MG tablet Commonly known as: ELAVIL TAKE 1 TABLET BY MOUTH AT BEDTIME AS NEEDED FOR SLEEP What changed:  reasons to take this additional instructions   buPROPion 150 MG 24 hr tablet Commonly known  as: WELLBUTRIN XL Take 1 tablet (150 mg total) by mouth every morning.   busPIRone 10 MG tablet Commonly known as: BUSPAR Take 1 tablet (10 mg total) by mouth 2 (two) times daily.   docusate sodium 100 MG capsule Commonly known as: COLACE Take 100 mg by mouth daily.   fluticasone 50 MCG/ACT nasal spray Commonly known as: FLONASE Place 2 sprays into both nostrils daily.   furosemide 20 MG tablet Commonly known as: LASIX Take 1 tablet (20 mg total) by mouth as needed. For lower extremity swelling.   GLUCOSAMINE-CHONDROITIN PO Take 2 tablets by mouth in the morning and at bedtime.   lidocaine 5 % ointment Commonly known as: XYLOCAINE Apply 1 application. topically 3 (three)  times daily as needed. Apply pea size amount to area as needed   methocarbamol 500 MG tablet Commonly known as: ROBAXIN Take 500 mg by mouth every 8 (eight) hours as needed for muscle spasms.   metoprolol tartrate 50 MG tablet Commonly known as: LOPRESSOR Take 0.5 tablets (25 mg total) by mouth 2 (two) times daily.   multivitamin tablet Take 1 tablet by mouth daily.   omega-3 acid ethyl esters 1 g capsule Commonly known as: LOVAZA Take 2 capsules (2 g total) by mouth 2 (two) times daily.   pantoprazole 20 MG tablet Commonly known as: PROTONIX Take 1 tablet (20 mg total) by mouth daily.   Probiotic 250 MG Caps Take 1 capsule by mouth daily.   rosuvastatin 20 MG tablet Commonly known as: CRESTOR Take 1 tablet (20 mg total) by mouth daily. What changed: when to take this   sildenafil 50 MG tablet Commonly known as: VIAGRA   tamsulosin 0.4 MG Caps capsule Commonly known as: FLOMAX Take 1 capsule (0.4 mg total) by mouth daily.   valsartan 80 MG tablet Commonly known as: DIOVAN Take 80 mg by mouth daily.   Visine Dry Eye Relief 1 % Soln Generic drug: Polyethylene Glycol 400 Place 1 drop into both eyes daily as needed (dry eyes).   Vitamin D 125 MCG (5000 UT) Caps Take 5,000 Units by mouth daily.   Xarelto 20 MG Tabs tablet Generic drug: rivaroxaban Take 20 mg by mouth daily with supper.        Allergies:  Allergies  Allergen Reactions   Penicillins Itching    Family History: Family History  Problem Relation Age of Onset   Asthma Mother    Heart disease Mother    Heart attack Mother    Dementia Mother    Asthma Father    Heart disease Father    Stroke Father     Social History:   reports that he quit smoking about 48 years ago. His smoking use included pipe. He started smoking about 49 years ago. He has never used smokeless tobacco. He reports that he does not drink alcohol and does not use drugs.  Physical Exam: There were no vitals taken for  this visit.  Constitutional:  Alert and oriented, no acute distress, nontoxic appearing HEENT: [place], AT Cardiovascular: No clubbing, cyanosis, or edema Respiratory: Normal respiratory effort, no increased work of breathing Skin: No rashes, bruises or suspicious lesions Neurologic: Grossly intact, no focal deficits, moving all 4 extremities Psychiatric: Normal mood and affect  Laboratory Data: Results for orders placed or performed in visit on 08/05/22  Microscopic Examination   Urine  Result Value Ref Range   WBC, UA 0-5 0 - 5 /hpf   RBC, Urine 3-10 (A) 0 - 2 /  hpf   Epithelial Cells (non renal) 0-10 0 - 10 /hpf   Bacteria, UA None seen None seen/Few  Urinalysis, Complete  Result Value Ref Range   Specific Gravity, UA 1.010 1.005 - 1.030   pH, UA 6.0 5.0 - 7.5   Color, UA Yellow Yellow   Appearance Ur Clear Clear   Leukocytes,UA Negative Negative   Protein,UA Negative Negative/Trace   Glucose, UA Negative Negative   Ketones, UA Negative Negative   RBC, UA 1+ (A) Negative   Bilirubin, UA Negative Negative   Urobilinogen, Ur 0.2 0.2 - 1.0 mg/dL   Nitrite, UA Negative Negative   Microscopic Examination See below:   BLADDER SCAN AMB NON-IMAGING  Result Value Ref Range   Scan Result 138m    Assessment & Plan:   1. Gross hematuria New gross hematuria in this patient with a history of microscopic hematuria with last benign work-up in 2017 associated with left flank pain, with stable appearing bilateral nephrolithiasis on recent KUB.  I had a lengthy conversation today with the patient regarding the etiology of blood in the urine.  I explained that blood in the urine can be caused by a myriad of factors, including but not limited to infection, stones, cysts, anticoagulation, and urinary tract malignancies.  I explained that the recommended work-up for blood in the urine is twofold and includes a CT urogram for evaluation of the upper urinary tract including kidneys and ureters as well  as a cystoscopy for evaluation of the urethra and bladder.  I explained that these two studies complement one another in reviewing the entire urinary tract for possible causes of bleeding.  I recommended that we proceed with this at this time.  Patient agreed; CTU ordered and follow-up cysto with CTU results scheduled. - Urinalysis, Complete - BLADDER SCAN AMB NON-IMAGING - CT HEMATURIA WORKUP; Future   Return in about 3 weeks (around 08/26/2022) for Cysto and CTU results.  SDebroah Loop PA-C  [facility] [address] [place] [phone]

## 2022-08-10 ENCOUNTER — Telehealth: Payer: Self-pay | Admitting: Family Medicine

## 2022-08-10 ENCOUNTER — Ambulatory Visit: Payer: Medicare Other | Attending: Family Medicine | Admitting: Physical Therapy

## 2022-08-10 DIAGNOSIS — R262 Difficulty in walking, not elsewhere classified: Secondary | ICD-10-CM | POA: Diagnosis not present

## 2022-08-10 DIAGNOSIS — R269 Unspecified abnormalities of gait and mobility: Secondary | ICD-10-CM | POA: Diagnosis not present

## 2022-08-10 DIAGNOSIS — M6281 Muscle weakness (generalized): Secondary | ICD-10-CM | POA: Diagnosis not present

## 2022-08-10 DIAGNOSIS — R2681 Unsteadiness on feet: Secondary | ICD-10-CM | POA: Diagnosis not present

## 2022-08-10 DIAGNOSIS — R2689 Other abnormalities of gait and mobility: Secondary | ICD-10-CM | POA: Insufficient documentation

## 2022-08-10 DIAGNOSIS — M25611 Stiffness of right shoulder, not elsewhere classified: Secondary | ICD-10-CM | POA: Insufficient documentation

## 2022-08-10 DIAGNOSIS — M25511 Pain in right shoulder: Secondary | ICD-10-CM | POA: Insufficient documentation

## 2022-08-10 DIAGNOSIS — R279 Unspecified lack of coordination: Secondary | ICD-10-CM | POA: Insufficient documentation

## 2022-08-10 DIAGNOSIS — G8929 Other chronic pain: Secondary | ICD-10-CM | POA: Insufficient documentation

## 2022-08-10 NOTE — Telephone Encounter (Signed)
Copied from CRM [id number]. Topic: General - Other >> Aug 10, 2022  4:28 PM [place] B wrote: Reason for CRM: Patient called in requesting a rolator, because its easier for him to use than using weights, because was told he may push to hard with that and hurt himself. Please call back

## 2022-08-10 NOTE — Therapy (Signed)
OUTPATIENT PHYSICAL THERAPY TREATMENT   Patient Name: Tom Underwood MRN: [id number] DOB:1951-05-30, 71 y.o., male Today's Date: 08/10/2022  PCP: Steele Sizer, MD REFERRING PROVIDER: Myles Gip, DO   PT End of Session - 08/10/22 1245     Visit Number 14    Number of Visits 22    Date for PT Re-Evaluation 10/05/22    PT Start Time 8657    Activity Tolerance Patient tolerated treatment well;Patient limited by fatigue    Behavior During Therapy [facility] for tasks assessed/performed             1245 to 1359  (74 minutes).      Past Medical History:  Diagnosis Date   Anxiety    Arthritis    Atrial fibrillation (Advance)    a.) CHA2DS2-VASc = 4 (age, HTN, CVA x2). b.) s/p 200 J synchronized cardioversion (DCCV) on 12/08/2021. c.) rate/rythum maintained on oral metoprolol tartrate; chronically anticoagulated using rivaroxaban.   BPH (benign prostatic hyperplasia)    Complication of anesthesia    a.)  Intolerant of propofol; causes "skin burning and involuntary muscle twitching"   DDD (degenerative disc disease), lumbar    Decreased libido    Depression    Diverticulitis    Diverticulosis    GERD (gastroesophageal reflux disease)    Hepatic steatosis    History of 2019 novel coronavirus disease (COVID-19) 12/28/2020   History of cardiac murmur in childhood    History of kidney stones    HLD (hyperlipidemia)    HLD (hyperlipidemia)    Hypertension    Hypogonadism in male    Long term current use of anticoagulant    a.) rivaroxaban   OSA on CPAP    Pre-diabetes    Rhinitis, allergic    Stroke ([place]) 07/23/2019   a.) LEFT posterior cor radiata. b.) residual RIGHT sided weakness   Past Surgical History:  Procedure Laterality Date   CARDIOVERSION N/A 12/08/2021   Procedure: CARDIOVERSION;  Surgeon: Nelva Bush, MD;  Location: [facility] ORS;  Service: Cardiovascular;  Laterality: N/A;   COLONOSCOPY     EVALUATION UNDER ANESTHESIA WITH HEMORRHOIDECTOMY N/A  04/15/2022   Procedure: EXAM UNDER ANESTHESIA WITH HEMORRHOIDECTOMY;  Surgeon: Benjamine Sprague, DO;  Location: [facility] ORS;  Service: General;  Laterality: N/A;   EXTERNAL EAR SURGERY     EYE SURGERY     FINGER SURGERY Right    lateration to 5 th digit   KNEE SURGERY     left eye     RECTAL EXAM UNDER ANESTHESIA N/A 04/23/2022   Procedure: RECTAL EXAM UNDER ANESTHESIA WITH LIGATION OF BLEEDING;  Surgeon: Olean Ree, MD;  Location: [facility] ORS;  Service: General;  Laterality: N/A;   Patient Active Problem List   Diagnosis Date Noted   Gastroesophageal reflux disease without esophagitis 05/17/2022   Dysthymia 05/17/2022   History of syncope 05/17/2022   History of hemorrhoidectomy 05/17/2022   Vitamin D deficiency 05/17/2022   Lower extremity edema 05/17/2022   History of rectal bleeding 05/17/2022   Bleeding internal hemorrhoids    Morbid obesity ([place])    Persistent atrial fibrillation ([place]) 07/14/2020   Hypertension    Hemiparesis of right dominant side as late effect of cerebral infarction ([place]) 08/13/2019   History of kidney stones 05/02/2018   ED (erectile dysfunction) 05/02/2018   Hyperglycemia 01/30/2017   Arthritis of knee, degenerative 01/30/2017   Benign prostatic hyperplasia without lower urinary tract symptoms 02/03/2016   Dyslipidemia 08/04/2015   Diverticulitis of large intestine without  perforation or abscess without bleeding 06/30/2015   OSA on CPAP 06/30/2015    REFERRING DIAG: M25.511,M25.512 (ICD-10-CM) - Bilateral shoulder pain, unspecified chronicity M17.2 (ICD-10-CM) - Post-traumatic osteoarthritis of both knees  R26.81 (ICD-10-CM) - Gait instability    THERAPY DIAG:  Difficulty in walking, not elsewhere classified  Muscle weakness (generalized)  Other abnormalities of gait and mobility  Unsteadiness on feet  Abnormality of gait and mobility  Unspecified lack of coordination  Decreased range of motion of right shoulder  Chronic right shoulder  pain  PERTINENT HISTORY: See evaluation  PRECAUTIONS: Fall  SUBJECTIVE:  08/10/22:  Pt. States B knee pain remains high and pt. Reports he was really stiff/ sore over the weekend.  Pt. Still waiting on lower leg compression stockings to arrive.  Pt. Entered PT with marked swelling in B lower legs but esp. In R LE.   Pt. Requires seated rest break upon entering PT gym due to fatigue.    PAIN:  Are you having pain? Yes: NPRS scale: 10/10 Pain location: B knee Pain description: aching Aggravating factors: prolonged standing/ walking Relieving factors: rest    TODAY'S TREATMENT:   08/10/22:  Nustep L5 10 min. B UE/LE.  Discussed knee pain/ lower leg swelling.    Walking in //-bars with high marching/ lateral walking (4 laps each).  Seated rest break required after 4 laps due to fatigue.  Seated marching/ LAQ/ heel raises 20x.   Standing wt. Shifting in //-bars with light to no UE assist.  Discussed upright posture.  Reassessment of sh. Flexion.    Walking in clinic/ hallway with use of rollator (marked increase in cadence as compared to use of SPC) and cuing to decrease UE assist on rollator.  No LOB.  Pt. Has a slow cadence with SPC, no LOB.   Pt. Will contact PCP to get MD order for rollator.  Pt. Will benefit from rollator when going to beach next week to celebrate anniversary.    Walking at agility ladder with 6" hurdles and use of SPC 3 laps.      Walking outside with rollator and cuing for proper technique/ posture.  No LOB.  Pt. Able to amb. From gym to car and then back to bathroom in hallway and then to car.  Fatigue.    Discussed daily HEP/ walking at home.      PATIENT EDUCATION: Education details: HEP/ daily activity Person educated: Patient Education method: Customer service manager Education comprehension: verbalized understanding and returned demonstration   HOME EXERCISE PROGRAM: Handouts given for shoulder ROM/ strengthening/ standing hip ex.    PT Short  Term Goals -      PT SHORT TERM GOAL #1   Title Pt will be indep with HEP to improve strength and balance to reduce risk of falls.    Baseline 11/23: initiated    Time 4    Period Weeks    Status Achieved              PT Long Term Goals -      PT LONG TERM GOAL #1   Title Pt will improve FOTO score to 50 to display improvement in functional mobility.    Baseline 6/7: 33   Time 8   Period Weeks    Status Ongoing   Target Date 10/05/2022     PT LONG TERM GOAL #2   Title Pt wil improve TUG score to < 12 sec with no AD to display significant imporvement in reduced risk  of falls    Baseline 11/23: 26 sec (did not sit down immediately leading to higher time than he would've scored) 1/19: 14.44 sec.   6/7: 12.66 sec.    Time 8   Period Weeks    Status Partially Met    Target Date 10/05/22      PT LONG TERM GOAL #3   Title Pt will improve DGI to >19/24 to indicate decreased risk of falls with household and community walking tasks    Baseline 11/23: 11/24 1/19:12/24    Time 8    Period Weeks    Status Partially Met    Target Date 10/05/2022     PT LONG TERM GOAL #4   Title Pt. Will increase R hip flexion/ LE muscle strength 1/2 muscle grade to improve standing tolerance/ walking endurance.    Baseline See above   Time 8    Period Weeks    Status Partially Met    Target Date 10/05/22              Plan -     Clinical Impression Statement Pt. Continues to have significant swelling in B lower legs and pitting edema.   PT treatment tried use of rollator to improve walking endurance/ distance and promote safety.  Marked increase in cadence with use of rollator as compared to [facility] use.  Pt. Is continuing POC with focus on LE strengthening and cardiovascular endurance. Pt. Works hard during tx. Session with prolonged standing/ walking tasks with few seated rest breaks today. Several seated rest breaks required during walking/ there.ex.  Pt will continue to benefit from skilled PT  services to progress strength and mobility, along with cardiovascular endurance.    Personal Factors and Comorbidities Comorbidity 2    Comorbidities Hypertension, Obesity    Examination-Activity Limitations Bed Mobility;Reach Overhead;Squat;Lift;Dressing;Hygiene/Grooming    Examination-Participation Restrictions Community Activity;Yard Work    Merchant navy officer Evolving/Moderate complexity    Clinical Decision Making Moderate    Rehab Potential Fair    PT Frequency 1x / week    PT Duration 8 weeks    PT Treatment/Interventions ADLs/Self Care Home Management;Gait training;Stair training;Functional mobility training;Therapeutic activities;Therapeutic exercise;Balance training;Neuromuscular re-education;Manual techniques;Patient/family education;Electrical Stimulation;Moist Heat;Cryotherapy    PT Next Visit Plan Discuss beach trip   PT Home Exercise Plan Shoulder pulleys in flex, scaption, abd, pendelums, scap retractions with resistance (RTB), D2 flexion pattern in sitting no resistance.    Consulted and Agree with Plan of Care Patient          Pura Spice, PT, DPT # [phone] 08/10/2022, 12:46 PM

## 2022-08-11 ENCOUNTER — Encounter: Payer: Self-pay | Admitting: Cardiology

## 2022-08-11 ENCOUNTER — Other Ambulatory Visit: Payer: Self-pay

## 2022-08-11 ENCOUNTER — Telehealth: Payer: Self-pay | Admitting: Cardiology

## 2022-08-11 ENCOUNTER — Ambulatory Visit: Payer: Medicare Other | Attending: Cardiology | Admitting: Cardiology

## 2022-08-11 VITALS — BP 130/62 | HR 54 | Ht 70.0 in | Wt 294.8 lb

## 2022-08-11 DIAGNOSIS — E7800 Pure hypercholesterolemia, unspecified: Secondary | ICD-10-CM | POA: Diagnosis not present

## 2022-08-11 DIAGNOSIS — I10 Essential (primary) hypertension: Secondary | ICD-10-CM | POA: Diagnosis not present

## 2022-08-11 DIAGNOSIS — I4819 Other persistent atrial fibrillation: Secondary | ICD-10-CM | POA: Insufficient documentation

## 2022-08-11 DIAGNOSIS — I69351 Hemiplegia and hemiparesis following cerebral infarction affecting right dominant side: Secondary | ICD-10-CM

## 2022-08-11 NOTE — Telephone Encounter (Signed)
Order placed

## 2022-08-11 NOTE — Patient Instructions (Signed)
Medication Instructions:   Your physician recommends that you continue on your current medications as directed. Please refer to the Current Medication list given to you today.  *If you need a refill on your cardiac medications before your next appointment, please call your pharmacy*   Follow-Up: At [facility], you and your health needs are our priority.  As part of our continuing mission to provide you with exceptional heart care, we have created designated Provider Care Teams.  These Care Teams include your primary Cardiologist (physician) and Advanced Practice Providers (APPs -  Physician Assistants and Nurse Practitioners) who all work together to provide you with the care you need, when you need it.  We recommend signing up for the patient portal called "MyChart".  Sign up information is provided on this After Visit Summary.  MyChart is used to connect with patients for Virtual Visits (Telemedicine).  Patients are able to view lab/test results, encounter notes, upcoming appointments, etc.  Non-urgent messages can be sent to your provider as well.   To learn more about what you can do with MyChart, go to [email or web address].    Your next appointment:   3 month(s)  The format for your next appointment:   In Person  Provider:   You may see Debbe Odea, MD or one of the following Advanced Practice Providers on your designated Care Team:   Nicolasa Ducking, NP Eula Listen, PA-C Cadence Fransico [name], PA-C Charlsie Quest, NP     Important Information About Sugar

## 2022-08-11 NOTE — Telephone Encounter (Signed)
Pt calling in regards to a test that he say he had done I the office today. He would like a callback wit the results. Please advise

## 2022-08-11 NOTE — Progress Notes (Signed)
Cardiology Office Note:    Date:  08/11/2022   ID:  Tom Underwood, DOB [birth year], MRN [id number]  PCP:  Alba Cory, MD  [facility] Cardiologist:  Debbe Odea, MD  [facility] Electrophysiologist:  Lanier Prude, MD   Referring MD: Alba Cory, MD   No chief complaint on file.   History of Present Illness:    Tom Underwood is a 71 y.o. male with a hx of persistent A-fib (s/p DCCV 12/2021), hypertension, hyperlipidemia, CVA 07/2019 with right-sided weakness, OSA on CPAP presents for follow-up.  Being seen for atrial fibrillation, hypertension.  Has a history of hemorrhoids, underwent colonoscopy with resection of hemorrhoids.  Couple of days later developed significant blood in stool presented to the ED, taken to the OR where hemorrhoid resection was performed by general surgeon.  Has not been on Xarelto since.  Denies any additional bleeding.  Was seen by urology a week ago, some blood noted in urine microscopically.  Denies any gross hematuria or blood in stool.   Prior notes Echocardiogram 07/15/2020 normal systolic function, EF 55 to 60%.  Normal diastolic function. Previously evaluated by EP, not a candidate for ablation due to OSA, obesity.  Past Medical History:  Diagnosis Date   Anxiety    Arthritis    Atrial fibrillation (HCC)    a.) CHA2DS2-VASc = 4 (age, HTN, CVA x2). b.) s/p 200 J synchronized cardioversion (DCCV) on 12/08/2021. c.) rate/rythum maintained on oral metoprolol tartrate; chronically anticoagulated using rivaroxaban.   BPH (benign prostatic hyperplasia)    Complication of anesthesia    a.)  Intolerant of propofol; causes "skin burning and involuntary muscle twitching"   DDD (degenerative disc disease), lumbar    Decreased libido    Depression    Diverticulitis    Diverticulosis    GERD (gastroesophageal reflux disease)    Hepatic steatosis    History of 2019 novel coronavirus disease (COVID-19) 12/28/2020   History of  cardiac murmur in childhood    History of kidney stones    HLD (hyperlipidemia)    HLD (hyperlipidemia)    Hypertension    Hypogonadism in male    Long term current use of anticoagulant    a.) rivaroxaban   OSA on CPAP    Pre-diabetes    Rhinitis, allergic    Stroke (HCC) 07/23/2019   a.) LEFT posterior cor radiata. b.) residual RIGHT sided weakness    Past Surgical History:  Procedure Laterality Date   CARDIOVERSION N/A 12/08/2021   Procedure: CARDIOVERSION;  Surgeon: Yvonne Kendall, MD;  Location: [facility] ORS;  Service: Cardiovascular;  Laterality: N/A;   COLONOSCOPY     EVALUATION UNDER ANESTHESIA WITH HEMORRHOIDECTOMY N/A 04/15/2022   Procedure: EXAM UNDER ANESTHESIA WITH HEMORRHOIDECTOMY;  Surgeon: Sung Amabile, DO;  Location: [facility] ORS;  Service: General;  Laterality: N/A;   EXTERNAL EAR SURGERY     EYE SURGERY     FINGER SURGERY Right    lateration to 5 th digit   KNEE SURGERY     left eye     RECTAL EXAM UNDER ANESTHESIA N/A 04/23/2022   Procedure: RECTAL EXAM UNDER ANESTHESIA WITH LIGATION OF BLEEDING;  Surgeon: Henrene Dodge, MD;  Location: [facility] ORS;  Service: General;  Laterality: N/A;    Current Medications: Current Meds  Medication Sig   amitriptyline (ELAVIL) 25 MG tablet TAKE 1 TABLET BY MOUTH AT BEDTIME AS NEEDED FOR SLEEP (Patient taking differently: Take 25 mg by mouth at bedtime as needed for sleep.)  buPROPion (WELLBUTRIN XL) 150 MG 24 hr tablet Take 1 tablet (150 mg total) by mouth every morning.   busPIRone (BUSPAR) 10 MG tablet Take 1 tablet (10 mg total) by mouth 2 (two) times daily.   Cholecalciferol (VITAMIN D) 125 MCG (5000 UT) CAPS Take 5,000 Units by mouth daily.   fluticasone (FLONASE) 50 MCG/ACT nasal spray Place 2 sprays into both nostrils daily.   furosemide (LASIX) 20 MG tablet Take 1 tablet (20 mg total) by mouth as needed. For lower extremity swelling.   GLUCOSAMINE-CHONDROITIN PO Take 2 tablets by mouth in the morning and at bedtime.    lidocaine (XYLOCAINE) 5 % ointment Apply 1 application. topically 3 (three) times daily as needed. Apply pea size amount to area as needed   methocarbamol (ROBAXIN) 500 MG tablet Take 500 mg by mouth every 8 (eight) hours as needed for muscle spasms.   metoprolol tartrate (LOPRESSOR) 50 MG tablet Take 0.5 tablets (25 mg total) by mouth 2 (two) times daily.   Multiple Vitamin (MULTIVITAMIN) tablet Take 1 tablet by mouth daily.   omega-3 acid ethyl esters (LOVAZA) 1 g capsule Take 2 capsules (2 g total) by mouth 2 (two) times daily.   pantoprazole (PROTONIX) 20 MG tablet Take 1 tablet (20 mg total) by mouth daily.   rosuvastatin (CRESTOR) 20 MG tablet Take 1 tablet (20 mg total) by mouth daily. (Patient taking differently: Take 20 mg by mouth every evening.)   Saccharomyces boulardii (PROBIOTIC) 250 MG CAPS Take 1 capsule by mouth daily.   sildenafil (VIAGRA) 50 MG tablet    tamsulosin (FLOMAX) 0.4 MG CAPS capsule Take 1 capsule (0.4 mg total) by mouth daily.   valsartan (DIOVAN) 80 MG tablet Take 80 mg by mouth daily.   VISINE DRY EYE RELIEF 1 % SOLN Place 1 drop into both eyes daily as needed (dry eyes).     Allergies:   Penicillins   Social History   Socioeconomic History   Marital status: Married    Spouse name: [name]    Number of children: 2   Years of education: Not on file   Highest education level: Not on file  Occupational History   Occupation: retired   Tobacco Use   Smoking status: Former    Types: Pipe    Start date: 02/20/1973    Quit date: 02/20/1974    Years since quitting: 48.5   Smokeless tobacco: Never  Vaping Use   Vaping Use: Never used  Substance and Sexual Activity   Alcohol use: No    Alcohol/week: 0.0 standard drinks of alcohol   Drug use: No   Sexual activity: Yes    Partners: Female  Other Topics Concern   Not on file  Social History Narrative   Not on file   Social Determinants of Health   Financial Resource Strain: Low Risk  (05/17/2022)    Overall Financial Resource Strain (CARDIA)    Difficulty of Paying Living Expenses: Not hard at all  Food Insecurity: No Food Insecurity (05/17/2022)   Hunger Vital Sign    Worried About Running Out of Food in the Last Year: Never true    Ran Out of Food in the Last Year: Never true  Transportation Needs: No Transportation Needs (05/17/2022)   PRAPARE - Administrator, Civil Service (Medical): No    Lack of Transportation (Non-Medical): No  Physical Activity: Insufficiently Active (05/17/2022)   Exercise Vital Sign    Days of Exercise per Week: 1 day  Minutes of Exercise per Session: 60 min  Stress: No Stress Concern Present (05/17/2022)   [employer] of Occupational Health - Occupational Stress Questionnaire    Feeling of Stress : Not at all  Social Connections: Socially Integrated (05/17/2022)   Social Connection and Isolation Panel [NHANES]    Frequency of Communication with Friends and Family: More than three times a week    Frequency of Social Gatherings with Friends and Family: Twice a week    Attends Religious Services: More than 4 times per year    Active Member of [employer] or Organizations: Yes    Attends Engineer, structural: More than 4 times per year    Marital Status: Married     Family History: The patient's family history includes Asthma in his father and mother; Dementia in his mother; Heart attack in his mother; Heart disease in his father and mother; Stroke in his father.  ROS:   Please see the history of present illness.     All other systems reviewed and are negative.  EKGs/Labs/Other Studies Reviewed:    The following studies were reviewed today:   EKG:  EKG is ordered today.  EKG shows sinus bradycardia, heart rate 54  Recent Labs: 04/23/2022: ALT 20 04/24/2022: BUN 18; Creatinine, Ser 1.10; Magnesium 2.1; Potassium 4.4; Sodium 141 07/01/2022: Hemoglobin 14.4; Platelets 219  Recent Lipid Panel    Component Value Date/Time   CHOL  116 07/01/2022 1118   TRIG 59 07/01/2022 1118   HDL 48 07/01/2022 1118   CHOLHDL 2.4 07/01/2022 1118   LDLCALC 55 07/01/2022 1118     Risk Assessment/Calculations:      Physical Exam:    VS:  BP 130/62   Pulse (!) 54   Ht 5\' 10"  (1.778 m)   Wt 294 lb 12.8 oz (133.7 kg)   SpO2 97%   BMI 42.30 kg/m     Wt Readings from Last 3 Encounters:  08/11/22 294 lb 12.8 oz (133.7 kg)  07/25/22 299 lb (135.6 kg)  07/04/22 297 lb 9.6 oz (135 kg)     GEN:  Well nourished, well developed in no acute distress HEENT: Normal NECK: No JVD; No carotid bruits CARDIAC: Regular rate and rhythm, no murmurs RESPIRATORY:  Clear to auscultation without rales, wheezing or rhonchi  ABDOMEN: Soft, non-tender, non-distended MUSCULOSKELETAL: Trace edema SKIN: Warm and dry NEUROLOGIC:  Alert and oriented x 3 PSYCHIATRIC:  Normal affect   ASSESSMENT:    1. Persistent atrial fibrillation (HCC)   2. Primary hypertension   3. Pure hypercholesterolemia    PLAN:    In order of problems listed above:  Atrial fibrillation, persistent, s/p DCCV 12/2021.  Maintaining sinus rhythm.  CHA2DS2-VASc score 4 (Age, htn, CVA).  Continue Lopressor 25 mg twice daily, bleeding issues with Xarelto, hemorrhoids s/p surgical repair.  Hold Xarelto for now.  Refer to EP regarding additional input and Watchman consideration.  He has history of CVA, as such is high risk for recurrent strokes.  May consider trial of Eliquis due to better bleeding profile if Watchman is not an option. hypertension, BP controlled.  Continue Lopressor, valsartan. hyperlipidemia, cholesterol controlled, continue statin, lovaza.  Follow-up in 3 months.     Medication Adjustments/Labs and Tests Ordered: Current medicines are reviewed at length with the patient today.  Concerns regarding medicines are outlined above.  Orders Placed This Encounter  Procedures   Ambulatory referral to Cardiac Electrophysiology   EKG 12-Lead    No orders of  the  defined types were placed in this encounter.    Patient Instructions  Medication Instructions:   Your physician recommends that you continue on your current medications as directed. Please refer to the Current Medication list given to you today.  *If you need a refill on your cardiac medications before your next appointment, please call your pharmacy*   Follow-Up: At [facility], you and your health needs are our priority.  As part of our continuing mission to provide you with exceptional heart care, we have created designated Provider Care Teams.  These Care Teams include your primary Cardiologist (physician) and Advanced Practice Providers (APPs -  Physician Assistants and Nurse Practitioners) who all work together to provide you with the care you need, when you need it.  We recommend signing up for the patient portal called "MyChart".  Sign up information is provided on this After Visit Summary.  MyChart is used to connect with patients for Virtual Visits (Telemedicine).  Patients are able to view lab/test results, encounter notes, upcoming appointments, etc.  Non-urgent messages can be sent to your provider as well.   To learn more about what you can do with MyChart, go to [email or web address].    Your next appointment:   3 month(s)  The format for your next appointment:   In Person  Provider:   You may see Kate Sable, MD or one of the following Advanced Practice Providers on your designated Care Team:   Murray Hodgkins, NP Christell Faith, PA-C Cadence Kathlen Mody, PA-C Gerrie Nordmann, NP     Important Information About Sugar         Signed, Kate Sable, MD  08/11/2022 12:33 PM    [place]

## 2022-08-11 NOTE — Telephone Encounter (Signed)
I spoke with the patient. He wanted to inquire what his EKG from today showed.   I advised the patient that the EKG was not scanned in his chart yet, but per Dr. Merita Norton note: Atrial fibrillation, persistent, s/p DCCV 12/2021.  Maintaining sinus rhythm.   The patient voices understanding and is agreeable.  He was very appreciative of the call back.

## 2022-08-15 ENCOUNTER — Encounter: Payer: Medicare Other | Admitting: Physical Therapy

## 2022-08-20 IMAGING — DX DG CHEST 1V PORT
1 series · 1 of 1 positions shown · non-contrast
Comparison: 07/14/2020 and prior radiographs

CLINICAL DATA: Shortness of breath for 1 day.

EXAM:
PORTABLE CHEST 1 VIEW

[chest ap]
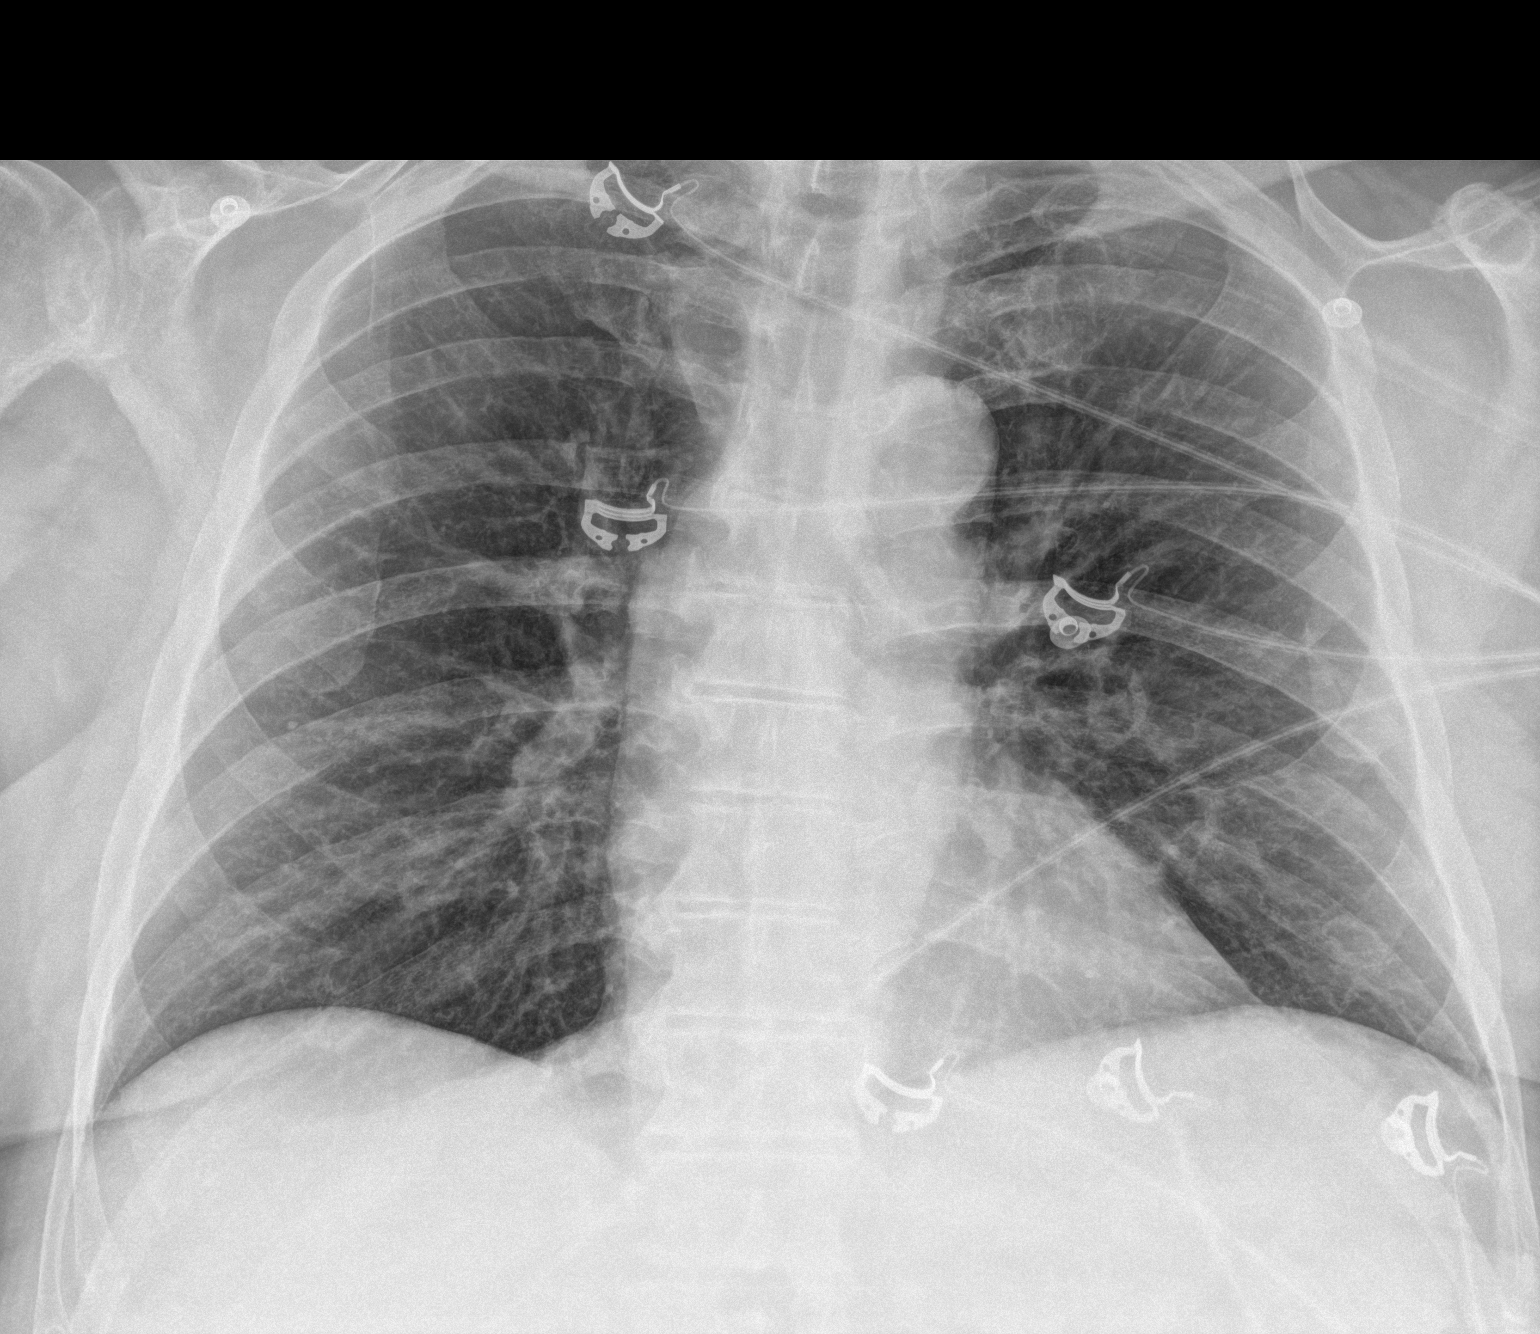

[1 of 1 positions shown; findings below may reference images not displayed]

FINDINGS: The cardiomediastinal silhouette is unremarkable.

There is no evidence of focal airspace disease, pulmonary edema,
suspicious pulmonary nodule/mass, pleural effusion, or pneumothorax.

No acute bony abnormalities are identified.
IMPRESSION: No active disease.

## 2022-08-22 ENCOUNTER — Encounter: Payer: Self-pay | Admitting: Physical Therapy

## 2022-08-22 ENCOUNTER — Ambulatory Visit: Payer: Medicare Other | Admitting: Physical Therapy

## 2022-08-22 DIAGNOSIS — R2681 Unsteadiness on feet: Secondary | ICD-10-CM

## 2022-08-22 DIAGNOSIS — R269 Unspecified abnormalities of gait and mobility: Secondary | ICD-10-CM | POA: Diagnosis not present

## 2022-08-22 DIAGNOSIS — R279 Unspecified lack of coordination: Secondary | ICD-10-CM

## 2022-08-22 DIAGNOSIS — M25611 Stiffness of right shoulder, not elsewhere classified: Secondary | ICD-10-CM

## 2022-08-22 DIAGNOSIS — M6281 Muscle weakness (generalized): Secondary | ICD-10-CM | POA: Diagnosis not present

## 2022-08-22 DIAGNOSIS — R262 Difficulty in walking, not elsewhere classified: Secondary | ICD-10-CM | POA: Diagnosis not present

## 2022-08-22 DIAGNOSIS — R2689 Other abnormalities of gait and mobility: Secondary | ICD-10-CM

## 2022-08-22 NOTE — Progress Notes (Unsigned)
Electrophysiology Office Follow up Visit Note:    Date:  08/24/2022   ID:  Tom Underwood, DOB 1950-12-22, MRN [id number]  PCP:  Alba Cory, MD  [facility] Cardiologist:  Debbe Odea, MD  [facility] Electrophysiologist:  Lanier Prude, MD    Interval History:    Tom Underwood is a 71 y.o. male who presents for a follow up visit. They were last seen in clinic 12/29/2021 for persistent AF. At the time of our last appt, he was asymptomatic while in AF. He was prescribed xarelto for stroke ppx. At the time of our last appt he was not thought to be a good ablation candidate because of his BMI.  He last saw Dr Azucena Cecil 08/11/2022 in follow up. He reported GI bleeding at that appointment. He has required resection of hemorrhoids in the past and his AC was discontinued. He presents today to discuss Watchman evaluation.      Past Medical History:  Diagnosis Date   Anxiety    Arthritis    Atrial fibrillation (HCC)    a.) CHA2DS2-VASc = 4 (age, HTN, CVA x2). b.) s/p 200 J synchronized cardioversion (DCCV) on 12/08/2021. c.) rate/rythum maintained on oral metoprolol tartrate; chronically anticoagulated using rivaroxaban.   BPH (benign prostatic hyperplasia)    Complication of anesthesia    a.)  Intolerant of propofol; causes "skin burning and involuntary muscle twitching"   DDD (degenerative disc disease), lumbar    Decreased libido    Depression    Diverticulitis    Diverticulosis    GERD (gastroesophageal reflux disease)    Hepatic steatosis    History of 2019 novel coronavirus disease (COVID-19) 12/28/2020   History of cardiac murmur in childhood    History of kidney stones    HLD (hyperlipidemia)    HLD (hyperlipidemia)    Hypertension    Hypogonadism in male    Long term current use of anticoagulant    a.) rivaroxaban   OSA on CPAP    Pre-diabetes    Rhinitis, allergic    Stroke (HCC) 07/23/2019   a.) LEFT posterior cor radiata. b.) residual  RIGHT sided weakness    Past Surgical History:  Procedure Laterality Date   CARDIOVERSION N/A 12/08/2021   Procedure: CARDIOVERSION;  Surgeon: Yvonne Kendall, MD;  Location: [facility] ORS;  Service: Cardiovascular;  Laterality: N/A;   COLONOSCOPY     EVALUATION UNDER ANESTHESIA WITH HEMORRHOIDECTOMY N/A 04/15/2022   Procedure: EXAM UNDER ANESTHESIA WITH HEMORRHOIDECTOMY;  Surgeon: Sung Amabile, DO;  Location: [facility] ORS;  Service: General;  Laterality: N/A;   EXTERNAL EAR SURGERY     EYE SURGERY     FINGER SURGERY Right    lateration to 5 th digit   KNEE SURGERY     left eye     RECTAL EXAM UNDER ANESTHESIA N/A 04/23/2022   Procedure: RECTAL EXAM UNDER ANESTHESIA WITH LIGATION OF BLEEDING;  Surgeon: Henrene Dodge, MD;  Location: [facility] ORS;  Service: General;  Laterality: N/A;    Current Medications: Current Meds  Medication Sig   amitriptyline (ELAVIL) 25 MG tablet TAKE 1 TABLET BY MOUTH AT BEDTIME AS NEEDED FOR SLEEP   buPROPion (WELLBUTRIN XL) 150 MG 24 hr tablet Take 1 tablet (150 mg total) by mouth every morning.   busPIRone (BUSPAR) 10 MG tablet Take 1 tablet (10 mg total) by mouth 2 (two) times daily.   Cholecalciferol (VITAMIN D) 125 MCG (5000 UT) CAPS Take 5,000 Units by mouth daily.   fluticasone (FLONASE)  50 MCG/ACT nasal spray Place 2 sprays into both nostrils daily.   furosemide (LASIX) 20 MG tablet Take 1 tablet (20 mg total) by mouth as needed. For lower extremity swelling.   GLUCOSAMINE-CHONDROITIN PO Take 2 tablets by mouth in the morning and at bedtime.   lidocaine (XYLOCAINE) 5 % ointment Apply 1 application. topically 3 (three) times daily as needed. Apply pea size amount to area as needed   metoprolol tartrate (LOPRESSOR) 50 MG tablet Take 0.5 tablets (25 mg total) by mouth 2 (two) times daily.   Multiple Vitamin (MULTIVITAMIN) tablet Take 1 tablet by mouth daily.   omega-3 acid ethyl esters (LOVAZA) 1 g capsule Take 2 capsules (2 g total) by mouth 2 (two) times daily.    pantoprazole (PROTONIX) 20 MG tablet Take 1 tablet (20 mg total) by mouth daily.   rosuvastatin (CRESTOR) 20 MG tablet Take 1 tablet (20 mg total) by mouth daily.   Saccharomyces boulardii (PROBIOTIC) 250 MG CAPS Take 1 capsule by mouth daily.   sildenafil (VIAGRA) 50 MG tablet    tamsulosin (FLOMAX) 0.4 MG CAPS capsule Take 1 capsule (0.4 mg total) by mouth daily.   valsartan (DIOVAN) 80 MG tablet Take 80 mg by mouth daily.   VISINE DRY EYE RELIEF 1 % SOLN Place 1 drop into both eyes daily as needed (dry eyes).     Allergies:   Penicillins   Social History   Socioeconomic History   Marital status: Married    Spouse name: Lupita LeashDonna    Number of children: 2   Years of education: Not on file   Highest education level: Not on file  Occupational History   Occupation: retired   Tobacco Use   Smoking status: Former    Types: Pipe    Start date: 02/20/1973    Quit date: 02/20/1974    Years since quitting: 48.5   Smokeless tobacco: Never  Vaping Use   Vaping Use: Never used  Substance and Sexual Activity   Alcohol use: No    Alcohol/week: 0.0 standard drinks of alcohol   Drug use: No   Sexual activity: Yes    Partners: Female  Other Topics Concern   Not on file  Social History Narrative   Not on file   Social Determinants of Health   Financial Resource Strain: Low Risk  (05/17/2022)   Overall Financial Resource Strain (CARDIA)    Difficulty of Paying Living Expenses: Not hard at all  Food Insecurity: No Food Insecurity (05/17/2022)   Hunger Vital Sign    Worried About Running Out of Food in the Last Year: Never true    Ran Out of Food in the Last Year: Never true  Transportation Needs: No Transportation Needs (05/17/2022)   PRAPARE - Administrator, Civil ServiceTransportation    Lack of Transportation (Medical): No    Lack of Transportation (Non-Medical): No  Physical Activity: Insufficiently Active (05/17/2022)   Exercise Vital Sign    Days of Exercise per Week: 1 day    Minutes of Exercise per Session:  60 min  Stress: No Stress Concern Present (05/17/2022)   [employer] - Occupational Stress Questionnaire    Feeling of Stress : Not at all  Social Connections: Socially Integrated (05/17/2022)   Social Connection and Isolation Panel [NHANES]    Frequency of Communication with Friends and Family: More than three times a week    Frequency of Social Gatherings with Friends and Family: Twice a week    Attends Religious Services: More  than 4 times per year    Active Member of Clubs or Organizations: Yes    Attends Music therapist: More than 4 times per year    Marital Status: Married     Family History: The patient's family history includes Asthma in his father and mother; Dementia in his mother; Heart attack in his mother; Heart disease in his father and mother; Stroke in his father.  ROS:   Please see the history of present illness.    All other systems reviewed and are negative.  EKGs/Labs/Other Studies Reviewed:    The following studies were reviewed today:  04/24/2022 Echo EF 60 RV normal      Recent Labs: 04/23/2022: ALT 20 04/24/2022: BUN 18; Creatinine, Ser 1.10; Magnesium 2.1; Potassium 4.4; Sodium 141 07/01/2022: Hemoglobin 14.4; Platelets 219  Recent Lipid Panel    Component Value Date/Time   CHOL 116 07/01/2022 1118   TRIG 59 07/01/2022 1118   HDL 48 07/01/2022 1118   CHOLHDL 2.4 07/01/2022 1118   LDLCALC 55 07/01/2022 1118    Physical Exam:    VS:  BP 137/72   Pulse (!) 54   Ht 5\' 10"  (1.778 m)   Wt 295 lb (133.8 kg)   BMI 42.33 kg/m     Wt Readings from Last 3 Encounters:  08/24/22 295 lb (133.8 kg)  08/24/22 298 lb (135.2 kg)  08/11/22 294 lb 12.8 oz (133.7 kg)     GEN:  Well nourished, well developed in no acute distress.  Obese HEENT: Normal NECK: No JVD; No carotid bruits LYMPHATICS: No lymphadenopathy CARDIAC: RRR, no murmurs, rubs, gallops RESPIRATORY:  Clear to auscultation without rales, wheezing  or rhonchi  ABDOMEN: Soft, non-tender, non-distended MUSCULOSKELETAL:  No edema; No deformity  SKIN: Warm and dry NEUROLOGIC:  Alert and oriented x 3 PSYCHIATRIC:  Normal affect        ASSESSMENT:    1. Persistent atrial fibrillation ([place])   2. Primary hypertension   3. Gastrointestinal hemorrhage, unspecified gastrointestinal hemorrhage type    PLAN:    In order of problems listed above:  #Persistent AF Maintaining sinus on metoprolol. Previously prescribed eliquis.  He had stopped taking his Xarelto at the time of his hemorrhoidal bleed (Apr 22, 2022).  Given his personal history of stroke in 2020, he is very concerned about his ongoing risk of stroke being off of his anticoagulant.  I had a long discussion with the patient during today's visit regarding left atrial appendage occlusion.  I discussed the procedure in detail including the risks and likelihood of success.  I discussed the need for short-term anticoagulation around the time of watchman implant.  If he wishes to proceed with watchman, would plan to start Xarelto 4 weeks prior to the watchman implant and continue for 45 days after implant.  We would then transition to Plavix 75 mg by mouth once daily.  ----------------------  I have seen Tom Underwood in the office today who is being considered for a Watchman left atrial appendage closure device. I believe they will benefit from this procedure given their history of atrial fibrillation, CHA2DS2-VASc score of 4 and unadjusted ischemic stroke rate of 4.8% per year. Unfortunately, the patient is not felt to be a long term anticoagulation candidate secondary to history of GI bleeding. The patient's chart has been reviewed and I feel that they would be a candidate for short term oral anticoagulation after Watchman implant.   It is my belief that after undergoing a  LAA closure procedure, Tom Underwood will not need long term anticoagulation which eliminates anticoagulation  side effects and major bleeding risk.   Procedural risks for the Watchman implant have been reviewed with the patient including a 0.5% risk of stroke, <1% risk of perforation and <1% risk of device embolization. Other risks include bleeding, vascular damage, tamponade, worsening renal function, and death. The patient understands these risks.   The published clinical data on the safety and effectiveness of WATCHMAN include but are not limited to the following: - Holmes DR, Everlene Farrier, Sick P et al. for the PROTECT AF Investigators. Percutaneous closure of the left atrial appendage versus warfarin therapy for prevention of stroke in patients with atrial fibrillation: a randomised non-inferiority trial. Lancet 2009; 374: 534-42. Everlene Farrier, Doshi SK, Isa Rankin D et al. on behalf of the PROTECT AF Investigators. Percutaneous Left Atrial Appendage Closure for Stroke Prophylaxis in Patients With Atrial Fibrillation 2.3-Year Follow-up of the PROTECT AF (Watchman Left Atrial Appendage System for Embolic Protection in Patients With Atrial Fibrillation) Trial. Circulation 2013; 127:720-729. - Alli O, Doshi S,  Kar S, Reddy VY, Sievert H et al. Quality of Life Assessment in the Randomized PROTECT AF (Percutaneous Closure of the Left Atrial Appendage Versus Warfarin Therapy for Prevention of Stroke in Patients With Atrial Fibrillation) Trial of Patients at Risk for Stroke With Nonvalvular Atrial Fibrillation. J Am Coll Cardiol 2013; 61:1790-8. Aline August DR, [place]ia [place], Price M, Whisenant B, Sievert H, Doshi S, Huber K, Reddy V. Prospective randomized evaluation of the Watchman left atrial appendage Device in patients with atrial fibrillation versus long-term warfarin therapy; the PREVAIL trial. Journal of the [employer] of Cardiology, Vol. 4, No. 1, 2014, 1-11. - Kar S, Doshi SK, Sadhu A, Horton R, Osorio J et al. Primary outcome evaluation of a next-generation left atrial appendage closure device: results from  the PINNACLE FLX trial. Circulation 2021;143(18)1754-1762.    The patient would like to think about his options prior to finalizing his decision which I think is very reasonable.  If he elects to proceed he will reach out to our office.  If he elects to proceed, I would like to obtain a gated CT scan of the chest with contrast timed for PV/LA visualization prior to the procedure.   HAS-BLED score 4 Hypertension Yes  Abnormal renal and liver function (Dialysis, transplant, Cr >2.26 mg/dL /Cirrhosis or Bilirubin >2x Normal or AST/ALT/AP >3x Normal) No  Stroke Yes  Bleeding Yes  Labile INR (Unstable/high INR) No  Elderly (>65) Yes  Drugs or alcohol (? 8 drinks/week, anti-plt or NSAID) No   CHA2DS2-VASc Score = 4  The patient's score is based upon: CHF History: 0 HTN History: 1 Diabetes History: 0 Stroke History: 2 Vascular Disease History: 0 Age Score: 1 Gender Score: 0     Medication Adjustments/Labs and Tests Ordered: Current medicines are reviewed at length with the patient today.  Concerns regarding medicines are outlined above.  No orders of the defined types were placed in this encounter.  No orders of the defined types were placed in this encounter.    Signed, Steffanie Dunn, MD, [facility], [facility] 08/24/2022 5:56 PM    Electrophysiology [facility]

## 2022-08-22 NOTE — Therapy (Signed)
OUTPATIENT PHYSICAL THERAPY TREATMENT   Patient Name: Tom Underwood MRN: [id number] DOB:[birth year], 71 y.o., male Today's Date: 08/22/2022  PCP: Steele Sizer, MD REFERRING PROVIDER: Myles Gip, DO   PT End of Session - 08/22/22 1307     Visit Number 15    Number of Visits 22    Date for PT Re-Evaluation 10/05/22    PT Start Time 1303    PT Stop Time 1354    PT Time Calculation (min) 51 min    Activity Tolerance Patient tolerated treatment well;Patient limited by fatigue    Behavior During Therapy [facility] for tasks assessed/performed               Past Medical History:  Diagnosis Date   Anxiety    Arthritis    Atrial fibrillation ([place])    a.) CHA2DS2-VASc = 4 (age, HTN, CVA x2). b.) s/p 200 J synchronized cardioversion (DCCV) on 12/08/2021. c.) rate/rythum maintained on oral metoprolol tartrate; chronically anticoagulated using rivaroxaban.   BPH (benign prostatic hyperplasia)    Complication of anesthesia    a.)  Intolerant of propofol; causes "skin burning and involuntary muscle twitching"   DDD (degenerative disc disease), lumbar    Decreased libido    Depression    Diverticulitis    Diverticulosis    GERD (gastroesophageal reflux disease)    Hepatic steatosis    History of 2019 novel coronavirus disease (COVID-19) 12/28/2020   History of cardiac murmur in childhood    History of kidney stones    HLD (hyperlipidemia)    HLD (hyperlipidemia)    Hypertension    Hypogonadism in male    Long term current use of anticoagulant    a.) rivaroxaban   OSA on CPAP    Pre-diabetes    Rhinitis, allergic    Stroke ([place]) 07/23/2019   a.) LEFT posterior cor radiata. b.) residual RIGHT sided weakness   Past Surgical History:  Procedure Laterality Date   CARDIOVERSION N/A 12/08/2021   Procedure: CARDIOVERSION;  Surgeon: Nelva Bush, MD;  Location: [facility] ORS;  Service: Cardiovascular;  Laterality: N/A;   COLONOSCOPY     EVALUATION UNDER ANESTHESIA WITH  HEMORRHOIDECTOMY N/A 04/15/2022   Procedure: EXAM UNDER ANESTHESIA WITH HEMORRHOIDECTOMY;  Surgeon: Benjamine Sprague, DO;  Location: [facility] ORS;  Service: General;  Laterality: N/A;   EXTERNAL EAR SURGERY     EYE SURGERY     FINGER SURGERY Right    lateration to 5 th digit   KNEE SURGERY     left eye     RECTAL EXAM UNDER ANESTHESIA N/A 04/23/2022   Procedure: RECTAL EXAM UNDER ANESTHESIA WITH LIGATION OF BLEEDING;  Surgeon: Olean Ree, MD;  Location: [facility] ORS;  Service: General;  Laterality: N/A;   Patient Active Problem List   Diagnosis Date Noted   Gastroesophageal reflux disease without esophagitis 05/17/2022   Dysthymia 05/17/2022   History of syncope 05/17/2022   History of hemorrhoidectomy 05/17/2022   Vitamin D deficiency 05/17/2022   Lower extremity edema 05/17/2022   History of rectal bleeding 05/17/2022   Bleeding internal hemorrhoids    Morbid obesity ([place])    Persistent atrial fibrillation ([place]) 07/14/2020   Hypertension    Hemiparesis of right dominant side as late effect of cerebral infarction ([place]) 08/13/2019   History of kidney stones 05/02/2018   ED (erectile dysfunction) 05/02/2018   Hyperglycemia 01/30/2017   Arthritis of knee, degenerative 01/30/2017   Benign prostatic hyperplasia without lower urinary tract symptoms 02/03/2016   Dyslipidemia 08/04/2015  Diverticulitis of large intestine without perforation or abscess without bleeding 06/30/2015   OSA on CPAP 06/30/2015    REFERRING DIAG: M25.511,M25.512 (ICD-10-CM) - Bilateral shoulder pain, unspecified chronicity M17.2 (ICD-10-CM) - Post-traumatic osteoarthritis of both knees  R26.81 (ICD-10-CM) - Gait instability    THERAPY DIAG:  Difficulty in walking, not elsewhere classified  Muscle weakness (generalized)  Other abnormalities of gait and mobility  Unsteadiness on feet  Abnormality of gait and mobility  Unspecified lack of coordination  Decreased range of motion of right shoulder  PERTINENT  HISTORY: See evaluation  PRECAUTIONS: Fall  SUBJECTIVE:  08/22/22:  Pt. States he had a good time at [facility] and was able to walk some to [employer].  Pt. Was unable to walk around for shopping with wife.  Pt. Is still looking into getting a rollator for gait to improve community mobility.    PAIN:  Are you having pain? Yes: NPRS scale: 9/10 Pain location: B knee Pain description: aching Aggravating factors: prolonged standing/ walking Relieving factors: rest    TODAY'S TREATMENT:   08/22/22:  Walking in //-bars with high marching/ lateral walking (4 laps each).  Seated rest break required due to fatigue.  Walking outside with rollator to bench and back to //-bars (working on maintaining a consistent recip. Pattern/ BOS/ upright posture).    Standing Nautilus: 50# lat. Pull downs/ 40# tricep extension 30x each.  Pt. Walked over to [employer] for seated rest break.      Walking in [place] with 6" hurdles/ alt. Cone taps with use of SPC 2 laps.      Nustep L5 10 min. B UE/LE.  Discussed upcoming MD appts.      Discussed daily HEP/ walking at home.      PATIENT EDUCATION: Education details: HEP/ daily activity Person educated: Patient Education method: Customer service manager Education comprehension: verbalized understanding and returned demonstration   HOME EXERCISE PROGRAM: Handouts given for shoulder ROM/ strengthening/ standing hip ex.    PT Short Term Goals -      PT SHORT TERM GOAL #1   Title Pt will be indep with HEP to improve strength and balance to reduce risk of falls.    Baseline 11/23: initiated    Time 4    Period Weeks    Status Achieved              PT Long Term Goals -      PT LONG TERM GOAL #1   Title Pt will improve FOTO score to 50 to display improvement in functional mobility.    Baseline 6/7: 33   Time 8   Period Weeks    Status Ongoing   Target Date 10/05/2022     PT LONG TERM GOAL #2   Title Pt wil improve TUG  score to < 12 sec with no AD to display significant imporvement in reduced risk of falls    Baseline 11/23: 26 sec (did not sit down immediately leading to higher time than he would've scored) 1/19: 14.44 sec.   6/7: 12.66 sec.    Time 8   Period Weeks    Status Partially Met    Target Date 10/05/22      PT LONG TERM GOAL #3   Title Pt will improve DGI to >19/24 to indicate decreased risk of falls with household and community walking tasks    Baseline 11/23: 11/24 1/19:12/24    Time 8    Period Weeks    Status Partially  Met    Target Date 10/05/2022     PT LONG TERM GOAL #4   Title Pt. Will increase R hip flexion/ LE muscle strength 1/2 muscle grade to improve standing tolerance/ walking endurance.    Baseline See above   Time 8    Period Weeks    Status Partially Met    Target Date 10/05/22              Plan -     Clinical Impression Statement Pt. Works hard during tx. Session with prolonged standing/ walking tasks with few seated rest breaks required.  Increase SOB during walking tasks/ Nustep.  Good recip. Gait pattern/ proprioception during cone taps and hurdles. No LOB during tx. And pt. Does benefit from rollator to improve walking cadence outside as compared to [facility].   Pt will continue to benefit from skilled PT services to progress strength and mobility, along with cardiovascular endurance.    Personal Factors and Comorbidities Comorbidity 2    Comorbidities Hypertension, Obesity    Examination-Activity Limitations Bed Mobility;Reach Overhead;Squat;Lift;Dressing;Hygiene/Grooming    Examination-Participation Restrictions Community Activity;Yard Work    Merchant navy officer Evolving/Moderate complexity    Clinical Decision Making Moderate    Rehab Potential Fair    PT Frequency 1x / week    PT Duration 8 weeks    PT Treatment/Interventions ADLs/Self Care Home Management;Gait training;Stair training;Functional mobility training;Therapeutic  activities;Therapeutic exercise;Balance training;Neuromuscular re-education;Manual techniques;Patient/family education;Electrical Stimulation;Moist Heat;Cryotherapy    PT Next Visit Plan Discuss beach trip   PT Home Exercise Plan Shoulder pulleys in flex, scaption, abd, pendelums, scap retractions with resistance (RTB), D2 flexion pattern in sitting no resistance.    Consulted and Agree with Plan of Care Patient          Pura Spice, PT, DPT # [id number] 08/22/2022, 6:56 PM

## 2022-08-23 NOTE — Progress Notes (Unsigned)
Name: Tom Underwood   MRN: [id number]    DOB: [birth year]   Date:08/24/2022       Progress Note  Subjective  Chief Complaint  Rolator Necessity   HPI  DME ordered 08/11/22 to [facility]  Patient had a stroke with right side hemiparesis back in 07/23/2019 , he still has PT and on recent visit they recommend a rollator to improve walking cadence.   Pt. Works hard during tx. Session with prolonged standing/ walking tasks with few seated rest breaks required.  Increase SOB during walking tasks/ Nustep.  Good recip. Gait pattern/ proprioception during cone taps and hurdles. No LOB during tx. And pt. Does benefit from rollator to improve walking cadence outside as compared to [facility].   Pt will continue to benefit from skilled PT services to progress strength and mobility, along with cardiovascular endurance.    Lower extremity edema: he is waiting for his compression stocking hoses , he has not seen vascular surgeon, he states going on since stroke, no pain  Both legs but worse on right side   Patient Active Problem List   Diagnosis Date Noted   Gastroesophageal reflux disease without esophagitis 05/17/2022   Dysthymia 05/17/2022   History of syncope 05/17/2022   History of hemorrhoidectomy 05/17/2022   Vitamin D deficiency 05/17/2022   Lower extremity edema 05/17/2022   History of rectal bleeding 05/17/2022   Bleeding internal hemorrhoids    Morbid obesity (HCC)    Persistent atrial fibrillation (HCC) 07/14/2020   Hypertension    Hemiparesis of right dominant side as late effect of cerebral infarction (HCC) 08/13/2019   History of kidney stones 05/02/2018   ED (erectile dysfunction) 05/02/2018   Hyperglycemia 01/30/2017   Arthritis of knee, degenerative 01/30/2017   Benign prostatic hyperplasia without lower urinary tract symptoms 02/03/2016   Dyslipidemia 08/04/2015   Diverticulitis of large intestine without perforation or abscess without bleeding 06/30/2015   OSA on CPAP  06/30/2015    Past Surgical History:  Procedure Laterality Date   CARDIOVERSION N/A 12/08/2021   Procedure: CARDIOVERSION;  Surgeon: Yvonne Kendall, MD;  Location: [facility] ORS;  Service: Cardiovascular;  Laterality: N/A;   COLONOSCOPY     EVALUATION UNDER ANESTHESIA WITH HEMORRHOIDECTOMY N/A 04/15/2022   Procedure: EXAM UNDER ANESTHESIA WITH HEMORRHOIDECTOMY;  Surgeon: Sung Amabile, DO;  Location: [facility] ORS;  Service: General;  Laterality: N/A;   EXTERNAL EAR SURGERY     EYE SURGERY     FINGER SURGERY Right    lateration to 5 th digit   KNEE SURGERY     left eye     RECTAL EXAM UNDER ANESTHESIA N/A 04/23/2022   Procedure: RECTAL EXAM UNDER ANESTHESIA WITH LIGATION OF BLEEDING;  Surgeon: Henrene Dodge, MD;  Location: [facility] ORS;  Service: General;  Laterality: N/A;    Family History  Problem Relation Age of Onset   Asthma Mother    Heart disease Mother    Heart attack Mother    Dementia Mother    Asthma Father    Heart disease Father    Stroke Father     Social History   Tobacco Use   Smoking status: Former    Types: Pipe    Start date: 02/20/1973    Quit date: 02/20/1974    Years since quitting: 48.5   Smokeless tobacco: Never  Substance Use Topics   Alcohol use: No    Alcohol/week: 0.0 standard drinks of alcohol     Current Outpatient Medications:    amitriptyline (ELAVIL)  25 MG tablet, TAKE 1 TABLET BY MOUTH AT BEDTIME AS NEEDED FOR SLEEP, Disp: 90 tablet, Rfl: 0   buPROPion (WELLBUTRIN XL) 150 MG 24 hr tablet, Take 1 tablet (150 mg total) by mouth every morning., Disp: 90 tablet, Rfl: 1   busPIRone (BUSPAR) 10 MG tablet, Take 1 tablet (10 mg total) by mouth 2 (two) times daily., Disp: 180 tablet, Rfl: 1   Cholecalciferol (VITAMIN D) 125 MCG (5000 UT) CAPS, Take 5,000 Units by mouth daily., Disp: , Rfl:    fluticasone (FLONASE) 50 MCG/ACT nasal spray, Place 2 sprays into both nostrils daily., Disp: 16 g, Rfl: 6   GLUCOSAMINE-CHONDROITIN PO, Take 2 tablets by mouth in the  morning and at bedtime., Disp: , Rfl:    lidocaine (XYLOCAINE) 5 % ointment, Apply 1 application. topically 3 (three) times daily as needed. Apply pea size amount to area as needed, Disp: 35.44 g, Rfl: 0   methocarbamol (ROBAXIN) 500 MG tablet, Take 500 mg by mouth every 8 (eight) hours as needed for muscle spasms., Disp: , Rfl:    metoprolol tartrate (LOPRESSOR) 50 MG tablet, Take 0.5 tablets (25 mg total) by mouth 2 (two) times daily., Disp: 90 tablet, Rfl: 2   Multiple Vitamin (MULTIVITAMIN) tablet, Take 1 tablet by mouth daily., Disp: , Rfl:    omega-3 acid ethyl esters (LOVAZA) 1 g capsule, Take 2 capsules (2 g total) by mouth 2 (two) times daily., Disp: 360 capsule, Rfl: 1   pantoprazole (PROTONIX) 20 MG tablet, Take 1 tablet (20 mg total) by mouth daily., Disp: 90 tablet, Rfl: 1   rivaroxaban (XARELTO) 20 MG TABS tablet, Take 20 mg by mouth daily with supper., Disp: , Rfl:    rosuvastatin (CRESTOR) 20 MG tablet, Take 1 tablet (20 mg total) by mouth daily., Disp: 90 tablet, Rfl: 2   Saccharomyces boulardii (PROBIOTIC) 250 MG CAPS, Take 1 capsule by mouth daily., Disp: 30 capsule, Rfl: 0   sildenafil (VIAGRA) 50 MG tablet, , Disp: , Rfl:    tamsulosin (FLOMAX) 0.4 MG CAPS capsule, Take 1 capsule (0.4 mg total) by mouth daily., Disp: 90 capsule, Rfl: 1   valsartan (DIOVAN) 80 MG tablet, Take 80 mg by mouth daily., Disp: , Rfl:    VISINE DRY EYE RELIEF 1 % SOLN, Place 1 drop into both eyes daily as needed (dry eyes)., Disp: , Rfl:    furosemide (LASIX) 20 MG tablet, Take 1 tablet (20 mg total) by mouth as needed. For lower extremity swelling., Disp: 90 tablet, Rfl: 1  Allergies  Allergen Reactions   Penicillins Itching    I personally reviewed active problem list, medication list, allergies, family history, social history, health maintenance with the patient/caregiver today.   ROS  Ten systems reviewed and is negative except as mentioned in HPI   Objective  Vitals:   08/24/22 1040   BP: 126/80  Pulse: 76  Resp: 16  SpO2: 95%  Weight: 298 lb (135.2 kg)  Height: 5\' 10"  (1.778 m)    Body mass index is 42.76 kg/m.  Physical Exam  Constitutional: Patient appears well-developed and well-nourished. Obese  No distress.  HEENT: head atraumatic, normocephalic, pupils equal and reactive to light, neck supple Cardiovascular: Normal rate, regular rhythm and normal heart sounds.  No murmur heard. Positive for right lower extremity  edema. Pulmonary/Chest: Effort normal and breath sounds normal. No respiratory distress. Abdominal: Soft.  There is no tenderness. Psychiatric: Patient has a normal mood and affect. behavior is normal. Judgment and thought  content normal.   Recent Results (from the past 2160 hour(s))  CBC with Differential/Platelet     Status: None   Collection Time: 07/01/22 11:18 AM  Result Value Ref Range   WBC 5.2 3.4 - 10.8 x10E3/uL   RBC 4.75 4.14 - 5.80 x10E6/uL   Hemoglobin 14.4 13.0 - 17.7 g/dL   Hematocrit 42.1 37.5 - 51.0 %   MCV 89 79 - 97 fL   MCH 30.3 26.6 - 33.0 pg   MCHC 34.2 31.5 - 35.7 g/dL   RDW 12.0 11.6 - 15.4 %   Platelets 219 150 - 450 x10E3/uL   Neutrophils 50 Not Estab. %   Lymphs 38 Not Estab. %   Monocytes 9 Not Estab. %   Eos 3 Not Estab. %   Basos 0 Not Estab. %   Neutrophils Absolute 2.6 1.4 - 7.0 x10E3/uL   Lymphocytes Absolute 2.0 0.7 - 3.1 x10E3/uL   Monocytes Absolute 0.4 0.1 - 0.9 x10E3/uL   EOS (ABSOLUTE) 0.2 0.0 - 0.4 x10E3/uL   Basophils Absolute 0.0 0.0 - 0.2 x10E3/uL   Immature Granulocytes 0 Not Estab. %   Immature Grans (Abs) 0.0 0.0 - 0.1 x10E3/uL  Hemoglobin A1c     Status: None   Collection Time: 07/01/22 11:18 AM  Result Value Ref Range   Hgb A1c MFr Bld 5.2 4.8 - 5.6 %    Comment:          Prediabetes: 5.7 - 6.4          Diabetes: >6.4          Glycemic control for adults with diabetes: <7.0    Est. average glucose Bld gHb Est-mCnc 103 mg/dL  Iron, TIBC and Ferritin Panel     Status: None    Collection Time: 07/01/22 11:18 AM  Result Value Ref Range   Total Iron Binding Capacity 343 250 - 450 ug/dL   UIBC 278 111 - 343 ug/dL   Iron 65 38 - 169 ug/dL   Iron Saturation 19 15 - 55 %   Ferritin 33 30 - 400 ng/mL  Lipid panel     Status: None   Collection Time: 07/01/22 11:18 AM  Result Value Ref Range   Cholesterol, Total 116 100 - 199 mg/dL   Triglycerides 59 0 - 149 mg/dL   HDL 48 >39 mg/dL   VLDL Cholesterol Cal 13 5 - 40 mg/dL   LDL Chol Calc (NIH) 55 0 - 99 mg/dL   Chol/HDL Ratio 2.4 0.0 - 5.0 ratio    Comment:                                   T. Chol/HDL Ratio                                             Men  Women                               1/2 Avg.Risk  3.4    3.3                                   Avg.Risk  5.0    4.4  2X Avg.Risk  9.6    7.1                                3X Avg.Risk 23.4   11.0   PSA     Status: None   Collection Time: 07/01/22 11:18 AM  Result Value Ref Range   Prostate Specific Ag, Serum 0.3 0.0 - 4.0 ng/mL    Comment: Roche ECLIA methodology. According to the American Urological Association, Serum PSA should decrease and remain at undetectable levels after radical prostatectomy. The AUA defines biochemical recurrence as an initial PSA value 0.2 ng/mL or greater followed by a subsequent confirmatory PSA value 0.2 ng/mL or greater. Values obtained with different assay methods or kits cannot be used interchangeably. Results cannot be interpreted as absolute evidence of the presence or absence of malignant disease.   Urinalysis, Complete w Microscopic     Status: Abnormal   Collection Time: 07/04/22 12:36 PM  Result Value Ref Range   Color, Urine YELLOW YELLOW   APPearance CLEAR CLEAR   Specific Gravity, Urine 1.015 1.005 - 1.030   pH 6.0 5.0 - 8.0   Glucose, UA NEGATIVE NEGATIVE mg/dL   Hgb urine dipstick TRACE (A) NEGATIVE   Bilirubin Urine NEGATIVE NEGATIVE   Ketones, ur NEGATIVE NEGATIVE mg/dL    Protein, ur NEGATIVE NEGATIVE mg/dL   Nitrite NEGATIVE NEGATIVE   Leukocytes,Ua NEGATIVE NEGATIVE   Squamous Epithelial / LPF 0-5 0 - 5   WBC, UA 0-5 0 - 5 WBC/hpf   RBC / HPF 0-5 0 - 5 RBC/hpf   Bacteria, UA FEW (A) NONE SEEN    Comment: Performed at [facility], [address], [place], [place] [address]  Urine Culture     Status: Abnormal   Collection Time: 07/04/22 12:36 PM   Specimen: Urine, Random  Result Value Ref Range   Specimen Description      URINE, RANDOM Performed at [facility], [address] [place], [place] [address]    Special Requests      NONE Performed at [facility] [address] [place], [place] [address]    Culture (A)     <10,000 COLONIES/mL INSIGNIFICANT GROWTH Performed at [place] [address] [place],  [address]    Report Status 07/05/2022 FINAL   Urinalysis, Complete     Status: Abnormal   Collection Time: 08/05/22  1:02 PM  Result Value Ref Range   Specific Gravity, UA 1.010 1.005 - 1.030   pH, UA 6.0 5.0 - 7.5   Color, UA Yellow Yellow   Appearance Ur Clear Clear   Leukocytes,UA Negative Negative   Protein,UA Negative Negative/Trace   Glucose, UA Negative Negative   Ketones, UA Negative Negative   RBC, UA 1+ (A) Negative   Bilirubin, UA Negative Negative   Urobilinogen, Ur 0.2 0.2 - 1.0 mg/dL   Nitrite, UA Negative Negative   Microscopic Examination See below:   Microscopic Examination     Status: Abnormal   Collection Time: 08/05/22  1:02 PM   Urine  Result Value Ref Range   WBC, UA 0-5 0 - 5 /hpf   RBC, Urine 3-10 (A) 0 - 2 /hpf   Epithelial Cells (non renal) 0-10 0 - 10 /hpf   Bacteria, UA None seen None seen/Few  BLADDER SCAN AMB NON-IMAGING     Status: None   Collection Time: 08/05/22  1:25 PM  Result Value Ref Range   Scan Result 156ml     PHQ2/9:    08/24/2022   10:40 AM 05/17/2022   10:36 AM 05/17/2022   10:11 AM 02/25/2022    2:42 PM 11/02/2021    2:38  PM  Depression screen PHQ 2/9  Decreased Interest 0 1 1 0 0  Down, Depressed, Hopeless 1 1 1  0 1  PHQ - 2 Score 1 2 2  0 1  Altered sleeping 0 0 0 0 0  Tired, decreased energy 0 1 1 0 0  Change in appetite 0 0 0 0 0  Feeling bad or failure about yourself  0 0 0 0 0  Trouble concentrating 0 0 0 0 0  Moving slowly or fidgety/restless 0 0 0 0 0  Suicidal thoughts 0 0 0 0 0  PHQ-9 Score 1 3 3  0 1  Difficult doing work/chores   Not difficult at all Not difficult at all     phq 9 is negative   Fall Risk:    08/24/2022   10:40 AM 05/17/2022   10:36 AM 05/17/2022   10:14 AM 02/25/2022    2:42 PM 11/02/2021    2:37 PM  Fall Risk   Falls in the past year? 0 0 0 1 0  Number falls in past yr: 0 0 0 0 0  Injury with Fall? 0 0 0 0 0  Risk for fall due to : Impaired mobility;Impaired balance/gait Impaired balance/gait Impaired balance/gait Impaired balance/gait;Impaired mobility No Fall Risks  Follow up Falls prevention discussed Falls prevention discussed Falls prevention discussed  Falls prevention discussed      Functional Status Survey: Is the patient deaf or have difficulty hearing?: No Does the patient have difficulty seeing, even when wearing glasses/contacts?: No Does the patient have difficulty concentrating, remembering, or making decisions?: No Does the patient have difficulty walking or climbing stairs?: Yes Does the patient have difficulty dressing or bathing?: Yes Does the patient have difficulty doing errands alone such as visiting a doctor's office or shopping?: No    Assessment & Plan  1. Need for immunization against influenza  - Flu Vaccine QUAD High Dose(Fluad)  2. Hemiparesis of right dominant side as late effect of cerebral infarction ([place])  Roll ator ordered   3. Lower extremity edema  - Ambulatory referral to Vascular Surgery

## 2022-08-24 ENCOUNTER — Ambulatory Visit (INDEPENDENT_AMBULATORY_CARE_PROVIDER_SITE_OTHER): Payer: Medicare Other | Admitting: Family Medicine

## 2022-08-24 ENCOUNTER — Encounter: Payer: Self-pay | Admitting: Cardiology

## 2022-08-24 ENCOUNTER — Encounter: Payer: Self-pay | Admitting: Family Medicine

## 2022-08-24 ENCOUNTER — Ambulatory Visit: Payer: Medicare Other | Attending: Cardiology | Admitting: Cardiology

## 2022-08-24 VITALS — BP 126/80 | HR 76 | Resp 16 | Ht 70.0 in | Wt 298.0 lb

## 2022-08-24 VITALS — BP 137/72 | HR 54 | Ht 70.0 in | Wt 295.0 lb

## 2022-08-24 DIAGNOSIS — K922 Gastrointestinal hemorrhage, unspecified: Secondary | ICD-10-CM | POA: Diagnosis not present

## 2022-08-24 DIAGNOSIS — I69351 Hemiplegia and hemiparesis following cerebral infarction affecting right dominant side: Secondary | ICD-10-CM | POA: Diagnosis not present

## 2022-08-24 DIAGNOSIS — I10 Essential (primary) hypertension: Secondary | ICD-10-CM | POA: Diagnosis not present

## 2022-08-24 DIAGNOSIS — Z23 Encounter for immunization: Secondary | ICD-10-CM | POA: Diagnosis not present

## 2022-08-24 DIAGNOSIS — R600 Localized edema: Secondary | ICD-10-CM

## 2022-08-24 DIAGNOSIS — I4819 Other persistent atrial fibrillation: Secondary | ICD-10-CM | POA: Insufficient documentation

## 2022-08-24 NOTE — Patient Instructions (Signed)
Medication Instructions:  none *If you need a refill on your cardiac medications before your next appointment, please call your pharmacy*   Lab Work: none If you have labs (blood work) drawn today and your tests are completely normal, you will receive your results only by: [place] (if you have MyChart) OR A paper copy in the mail If you have any lab test that is abnormal or we need to change your treatment, we will call you to review the results.   Testing/Procedures: Your physician has requested that you have Left atrial appendage (LAA) closure device implantation is a procedure to put a small device in the LAA of the heart. The LAA is a small sac in the wall of the heart's left upper chamber. Blood clots can form in this area. The device, Watchman closes the LAA to help prevent a blood clot and stroke.   Follow-Up: At [facility], you and your health needs are our priority.  As part of our continuing mission to provide you with exceptional heart care, we have created designated Provider Care Teams.  These Care Teams include your primary Cardiologist (physician) and Advanced Practice Providers (APPs -  Physician Assistants and Nurse Practitioners) who all work together to provide you with the care you need, when you need it.  We recommend signing up for the patient portal called "MyChart".  Sign up information is provided on this After Visit Summary.  MyChart is used to connect with patients for Virtual Visits (Telemedicine).  Patients are able to view lab/test results, encounter notes, upcoming appointments, etc.  Non-urgent messages can be sent to your provider as well.   To learn more about what you can do with MyChart, go to [email or web address].    Your next appointment:   As needed with Dr. Quentin Ore., please call the office if you wish to move forward with the Watchman device.    Important Information About Sugar

## 2022-08-26 ENCOUNTER — Other Ambulatory Visit: Payer: PRIVATE HEALTH INSURANCE | Admitting: Urology

## 2022-08-29 ENCOUNTER — Ambulatory Visit: Payer: Medicare Other | Admitting: Physical Therapy

## 2022-08-29 DIAGNOSIS — M6281 Muscle weakness (generalized): Secondary | ICD-10-CM | POA: Diagnosis not present

## 2022-08-29 DIAGNOSIS — R2689 Other abnormalities of gait and mobility: Secondary | ICD-10-CM | POA: Diagnosis not present

## 2022-08-29 DIAGNOSIS — R262 Difficulty in walking, not elsewhere classified: Secondary | ICD-10-CM

## 2022-08-29 DIAGNOSIS — R2681 Unsteadiness on feet: Secondary | ICD-10-CM

## 2022-08-29 DIAGNOSIS — R269 Unspecified abnormalities of gait and mobility: Secondary | ICD-10-CM | POA: Diagnosis not present

## 2022-08-29 DIAGNOSIS — R279 Unspecified lack of coordination: Secondary | ICD-10-CM

## 2022-08-29 NOTE — Therapy (Signed)
OUTPATIENT PHYSICAL THERAPY TREATMENT   Patient Name: Tom Underwood MRN: [id number] DOB:1951-11-11, 71 y.o., male Today's Date: 08/29/2022  PCP: Steele Sizer, MD REFERRING PROVIDER: Myles Gip, DO   PT End of Session - 08/29/22 1227     Visit Number 16    Number of Visits 22    Date for PT Re-Evaluation 10/05/22    PT Start Time 1303    PT Stop Time 1402    PT Time Calculation (min) 59 min    Activity Tolerance Patient tolerated treatment well;Patient limited by fatigue    Behavior During Therapy [facility] for tasks assessed/performed             Past Medical History:  Diagnosis Date   Anxiety    Arthritis    Atrial fibrillation ([place])    a.) CHA2DS2-VASc = 4 (age, HTN, CVA x2). b.) s/p 200 J synchronized cardioversion (DCCV) on 12/08/2021. c.) rate/rythum maintained on oral metoprolol tartrate; chronically anticoagulated using rivaroxaban.   BPH (benign prostatic hyperplasia)    Complication of anesthesia    a.)  Intolerant of propofol; causes "skin burning and involuntary muscle twitching"   DDD (degenerative disc disease), lumbar    Decreased libido    Depression    Diverticulitis    Diverticulosis    GERD (gastroesophageal reflux disease)    Hepatic steatosis    History of 2019 novel coronavirus disease (COVID-19) 12/28/2020   History of cardiac murmur in childhood    History of kidney stones    HLD (hyperlipidemia)    HLD (hyperlipidemia)    Hypertension    Hypogonadism in male    Long term current use of anticoagulant    a.) rivaroxaban   OSA on CPAP    Pre-diabetes    Rhinitis, allergic    Stroke ([place]) 07/23/2019   a.) LEFT posterior cor radiata. b.) residual RIGHT sided weakness   Past Surgical History:  Procedure Laterality Date   CARDIOVERSION N/A 12/08/2021   Procedure: CARDIOVERSION;  Surgeon: Nelva Bush, MD;  Location: [facility] ORS;  Service: Cardiovascular;  Laterality: N/A;   COLONOSCOPY     EVALUATION UNDER ANESTHESIA WITH  HEMORRHOIDECTOMY N/A 04/15/2022   Procedure: EXAM UNDER ANESTHESIA WITH HEMORRHOIDECTOMY;  Surgeon: Benjamine Sprague, DO;  Location: [facility] ORS;  Service: General;  Laterality: N/A;   EXTERNAL EAR SURGERY     EYE SURGERY     FINGER SURGERY Right    lateration to 5 th digit   KNEE SURGERY     left eye     RECTAL EXAM UNDER ANESTHESIA N/A 04/23/2022   Procedure: RECTAL EXAM UNDER ANESTHESIA WITH LIGATION OF BLEEDING;  Surgeon: Olean Ree, MD;  Location: [facility] ORS;  Service: General;  Laterality: N/A;   Patient Active Problem List   Diagnosis Date Noted   Gastroesophageal reflux disease without esophagitis 05/17/2022   Dysthymia 05/17/2022   History of syncope 05/17/2022   History of hemorrhoidectomy 05/17/2022   Vitamin D deficiency 05/17/2022   Lower extremity edema 05/17/2022   History of rectal bleeding 05/17/2022   Bleeding internal hemorrhoids    Morbid obesity ([place])    Persistent atrial fibrillation ([place]) 07/14/2020   Hypertension    Hemiparesis of right dominant side as late effect of cerebral infarction ([place]) 08/13/2019   History of kidney stones 05/02/2018   ED (erectile dysfunction) 05/02/2018   Hyperglycemia 01/30/2017   Arthritis of knee, degenerative 01/30/2017   Benign prostatic hyperplasia without lower urinary tract symptoms 02/03/2016   Dyslipidemia 08/04/2015  Diverticulitis of large intestine without perforation or abscess without bleeding 06/30/2015   OSA on CPAP 06/30/2015    REFERRING DIAG: M25.511,M25.512 (ICD-10-CM) - Bilateral shoulder pain, unspecified chronicity M17.2 (ICD-10-CM) - Post-traumatic osteoarthritis of both knees  R26.81 (ICD-10-CM) - Gait instability    THERAPY DIAG:  Difficulty in walking, not elsewhere classified  Muscle weakness (generalized)  Other abnormalities of gait and mobility  Unsteadiness on feet  Abnormality of gait and mobility  Unspecified lack of coordination  PERTINENT HISTORY: See evaluation  PRECAUTIONS:  Fall  SUBJECTIVE:  08/29/22:  Pt. Had a good weekend.  Pt. Planning to get a rollator at [employer] in [place] (will be self-pay since he received RW in past 3 years).  Discussed MD f/u visits.    PAIN:  Are you having pain? Yes: NPRS scale: 7/10 Pain location: B knee Pain description: aching Aggravating factors: prolonged standing/ walking Relieving factors: rest    TODAY'S TREATMENT:   08/29/22:  Walking in //-bars with high marching/ lateral walking (4 laps each).  Seated rest break required due to fatigue prior to standing wand ex.   Standing B shoulder AAROM with wand (flexion/ abduction)- focus on BOS and balance in a pain tolerable range with B shoulder.  20x each for flexion and 15x each for abduction.    Standing Nautilus: 50# lat. Pull downs/ 40# tricep extension 30x each.  Seated rest break after UE there.ex.   Walking in clinic without SPC and focus on BOS/ recip. Step pattern.      Nustep L5 10 min. B UE/LE.      Walking outside with [facility] (working on maintaining a consistent recip.  Pattern/ BOS/ upright posture).     Discussed daily HEP/ walking at home.      PATIENT EDUCATION: Education details: HEP/ daily activity Person educated: Patient Education method: Customer service manager Education comprehension: verbalized understanding and returned demonstration   HOME EXERCISE PROGRAM: Handouts given for shoulder ROM/ strengthening/ standing hip ex.    PT Short Term Goals -      PT SHORT TERM GOAL #1   Title Pt will be indep with HEP to improve strength and balance to reduce risk of falls.    Baseline 11/23: initiated    Time 4    Period Weeks    Status Achieved              PT Long Term Goals -      PT LONG TERM GOAL #1   Title Pt will improve FOTO score to 50 to display improvement in functional mobility.    Baseline 6/7: 33   Time 8   Period Weeks    Status Ongoing   Target Date 10/05/2022     PT LONG TERM GOAL #2    Title Pt wil improve TUG score to < 12 sec with no AD to display significant imporvement in reduced risk of falls    Baseline 11/23: 26 sec (did not sit down immediately leading to higher time than he would've scored) 1/19: 14.44 sec.   6/7: 12.66 sec.    Time 8   Period Weeks    Status Partially Met    Target Date 10/05/22      PT LONG TERM GOAL #3   Title Pt will improve DGI to >19/24 to indicate decreased risk of falls with household and community walking tasks    Baseline 11/23: 11/24 1/19:12/24    Time 8    Period [place]  Status Partially Met    Target Date 10/05/2022     PT LONG TERM GOAL #4   Title Pt. Will increase R hip flexion/ LE muscle strength 1/2 muscle grade to improve standing tolerance/ walking endurance.    Baseline See above   Time 8    Period Weeks    Status Partially Met    Target Date 10/05/22              Plan -     Clinical Impression Statement Pt. Works hard during tx. Session with prolonged standing/ walking tasks with few seated rest breaks required.  Increase SOB during walking tasks/ Nustep.  Good recip. Gait pattern/ proprioception during cone taps and hurdles. No LOB during tx. And pt. Does benefit from rollator to improve walking cadence outside as compared to [facility].   Pt will continue to benefit from skilled PT services to progress strength and mobility, along with cardiovascular endurance.    Personal Factors and Comorbidities Comorbidity 2    Comorbidities Hypertension, Obesity    Examination-Activity Limitations Bed Mobility;Reach Overhead;Squat;Lift;Dressing;Hygiene/Grooming    Examination-Participation Restrictions Community Activity;Yard Work    Merchant navy officer Evolving/Moderate complexity    Clinical Decision Making Moderate    Rehab Potential Fair    PT Frequency 1x / week    PT Duration 8 weeks    PT Treatment/Interventions ADLs/Self Care Home Management;Gait training;Stair training;Functional mobility  training;Therapeutic activities;Therapeutic exercise;Balance training;Neuromuscular re-education;Manual techniques;Patient/family education;Electrical Stimulation;Moist Heat;Cryotherapy    PT Next Visit Plan Discuss beach trip   PT Home Exercise Plan Shoulder pulleys in flex, scaption, abd, pendelums, scap retractions with resistance (RTB), D2 flexion pattern in sitting no resistance.    Consulted and Agree with Plan of Care Patient          Pura Spice, PT, DPT # [phone] 08/29/2022, 7:49 PM

## 2022-09-02 ENCOUNTER — Ambulatory Visit
Admission: RE | Admit: 2022-09-02 | Discharge: 2022-09-02 | Disposition: A | Discharge status: Home / Self Care | Payer: Medicare Other | Source: Ambulatory Visit | Attending: Physician Assistant | Admitting: Physician Assistant

## 2022-09-02 DIAGNOSIS — K5730 Diverticulosis of large intestine without perforation or abscess without bleeding: Secondary | ICD-10-CM | POA: Diagnosis not present

## 2022-09-02 DIAGNOSIS — N200 Calculus of kidney: Secondary | ICD-10-CM | POA: Diagnosis not present

## 2022-09-02 DIAGNOSIS — R310 Gross hematuria: Secondary | ICD-10-CM

## 2022-09-02 LAB — POCT I-STAT CREATININE: Creatinine, Ser: 1.1 mg/dL (ref 0.61–1.24)

## 2022-09-02 MED ORDER — IOHEXOL 300 MG/ML  SOLN
125.0000 mL | Freq: Once | INTRAMUSCULAR | Status: AC | PRN
  Administered 2022-09-02: 125 mL via INTRAVENOUS

## 2022-09-05 ENCOUNTER — Ambulatory Visit: Payer: Medicare Other | Attending: Family Medicine | Admitting: Physical Therapy

## 2022-09-05 DIAGNOSIS — R279 Unspecified lack of coordination: Secondary | ICD-10-CM | POA: Diagnosis not present

## 2022-09-05 DIAGNOSIS — R2689 Other abnormalities of gait and mobility: Secondary | ICD-10-CM | POA: Insufficient documentation

## 2022-09-05 DIAGNOSIS — R2681 Unsteadiness on feet: Secondary | ICD-10-CM | POA: Diagnosis not present

## 2022-09-05 DIAGNOSIS — M6281 Muscle weakness (generalized): Secondary | ICD-10-CM | POA: Insufficient documentation

## 2022-09-05 DIAGNOSIS — M25611 Stiffness of right shoulder, not elsewhere classified: Secondary | ICD-10-CM | POA: Insufficient documentation

## 2022-09-05 DIAGNOSIS — R262 Difficulty in walking, not elsewhere classified: Secondary | ICD-10-CM | POA: Diagnosis not present

## 2022-09-05 DIAGNOSIS — R269 Unspecified abnormalities of gait and mobility: Secondary | ICD-10-CM | POA: Insufficient documentation

## 2022-09-05 NOTE — Therapy (Signed)
OUTPATIENT PHYSICAL THERAPY TREATMENT   Patient Name: Tom Underwood MRN: [id number] DOB:1951/05/13, 71 y.o., male Today's Date: 09/05/2022  PCP: Steele Sizer, MD REFERRING PROVIDER: Myles Gip, DO   PT End of Session - 09/05/22 1237     Visit Number 17    Number of Visits 22    Date for PT Re-Evaluation 10/05/22    PT Start Time 1301    PT Stop Time 1403    PT Time Calculation (min) 62 min    Activity Tolerance Patient tolerated treatment well;Patient limited by fatigue    Behavior During Therapy WFL for tasks assessed/performed             Past Medical History:  Diagnosis Date   Anxiety    Arthritis    Atrial fibrillation ([place])    a.) CHA2DS2-VASc = 4 (age, HTN, CVA x2). b.) s/p 200 J synchronized cardioversion (DCCV) on 12/08/2021. c.) rate/rythum maintained on oral metoprolol tartrate; chronically anticoagulated using rivaroxaban.   BPH (benign prostatic hyperplasia)    Complication of anesthesia    a.)  Intolerant of propofol; causes "skin burning and involuntary muscle twitching"   DDD (degenerative disc disease), lumbar    Decreased libido    Depression    Diverticulitis    Diverticulosis    GERD (gastroesophageal reflux disease)    Hepatic steatosis    History of 2019 novel coronavirus disease (COVID-19) 12/28/2020   History of cardiac murmur in childhood    History of kidney stones    HLD (hyperlipidemia)    HLD (hyperlipidemia)    Hypertension    Hypogonadism in male    Long term current use of anticoagulant    a.) rivaroxaban   OSA on CPAP    Pre-diabetes    Rhinitis, allergic    Stroke ([place]) 07/23/2019   a.) LEFT posterior cor radiata. b.) residual RIGHT sided weakness   Past Surgical History:  Procedure Laterality Date   CARDIOVERSION N/A 12/08/2021   Procedure: CARDIOVERSION;  Surgeon: Nelva Bush, MD;  Location: [facility]S;  Service: Cardiovascular;  Laterality: N/A;   COLONOSCOPY     EVALUATION UNDER ANESTHESIA WITH  HEMORRHOIDECTOMY N/A 04/15/2022   Procedure: EXAM UNDER ANESTHESIA WITH HEMORRHOIDECTOMY;  Surgeon: Benjamine Sprague, DO;  Location: [facility] ORS;  Service: General;  Laterality: N/A;   EXTERNAL EAR SURGERY     EYE SURGERY     FINGER SURGERY Right    lateration to 5 th digit   KNEE SURGERY     left eye     RECTAL EXAM UNDER ANESTHESIA N/A 04/23/2022   Procedure: RECTAL EXAM UNDER ANESTHESIA WITH LIGATION OF BLEEDING;  Surgeon: Olean Ree, MD;  Location: [facility] ORS;  Service: General;  Laterality: N/A;   Patient Active Problem List   Diagnosis Date Noted   Gastroesophageal reflux disease without esophagitis 05/17/2022   Dysthymia 05/17/2022   History of syncope 05/17/2022   History of hemorrhoidectomy 05/17/2022   Vitamin D deficiency 05/17/2022   Lower extremity edema 05/17/2022   History of rectal bleeding 05/17/2022   Bleeding internal hemorrhoids    Morbid obesity ([place])    Persistent atrial fibrillation ([place]) 07/14/2020   Hypertension    Hemiparesis of right dominant side as late effect of cerebral infarction ([place]) 08/13/2019   History of kidney stones 05/02/2018   ED (erectile dysfunction) 05/02/2018   Hyperglycemia 01/30/2017   Arthritis of knee, degenerative 01/30/2017   Benign prostatic hyperplasia without lower urinary tract symptoms 02/03/2016   Dyslipidemia 08/04/2015  Diverticulitis of large intestine without perforation or abscess without bleeding 06/30/2015   OSA on CPAP 06/30/2015    REFERRING DIAG: M25.511,M25.512 (ICD-10-CM) - Bilateral shoulder pain, unspecified chronicity M17.2 (ICD-10-CM) - Post-traumatic osteoarthritis of both knees  R26.81 (ICD-10-CM) - Gait instability    THERAPY DIAG:  Difficulty in walking, not elsewhere classified  Muscle weakness (generalized)  Other abnormalities of gait and mobility  Unsteadiness on feet  Abnormality of gait and mobility  Unspecified lack of coordination  Decreased range of motion of right shoulder  PERTINENT  HISTORY: See evaluation  PRECAUTIONS: Fall  SUBJECTIVE:  09/05/22:  Pt. Had a good weekend.  No new complaints.  Pt. States he is having a cardiac procedure in a couple weeks to decrease medication use.    PAIN:  Are you having pain? Yes: NPRS scale: 7/10 Pain location: B knee Pain description: aching Aggravating factors: prolonged standing/ walking Relieving factors: rest    TODAY'S TREATMENT:   09/05/22:  6MWT: 38 feet x 16 laps plus 12 feet (620 feet).  HR: 65 bpm/ O2 sat. 95%.  Standing Nautilus: 50# lat. Pull downs/ 40# tricep extension 15x2 each.  Seated rest break after UE there.ex.   BP assessed after UE ex.: 123/59  Walking in //-bars with high marching/ lateral walking (4 laps each).  Seated rest break required due to fatigue prior to standing wand ex.   Standing B shoulder AAROM with wand (flexion/ abduction)- focus on BOS and balance in a pain tolerable range with B shoulder.  20x each for flexion and 15x each for abduction.      Nustep L5 10 min. B UE/LE.      Walking outside with [facility] (working on maintaining a consistent recip.  Pattern/ BOS/ upright posture).     Discussed daily HEP/ walking at home.      PATIENT EDUCATION: Education details: HEP/ daily activity Person educated: Patient Education method: Customer service manager Education comprehension: verbalized understanding and returned demonstration   HOME EXERCISE PROGRAM: Handouts given for shoulder ROM/ strengthening/ standing hip ex.    PT Short Term Goals -      PT SHORT TERM GOAL #1   Title Pt will be indep with HEP to improve strength and balance to reduce risk of falls.    Baseline 11/23: initiated    Time 4    Period Weeks    Status Achieved              PT Long Term Goals -      PT LONG TERM GOAL #1   Title Pt will improve FOTO score to 50 to display improvement in functional mobility.    Baseline 6/7: 33   Time 8   Period Weeks    Status Ongoing   Target Date  10/05/2022     PT LONG TERM GOAL #2   Title Pt wil improve TUG score to < 12 sec with no AD to display significant imporvement in reduced risk of falls    Baseline 11/23: 26 sec (did not sit down immediately leading to higher time than he would've scored) 1/19: 14.44 sec.   6/7: 12.66 sec.    Time 8   Period Weeks    Status Partially Met    Target Date 10/05/22      PT LONG TERM GOAL #3   Title Pt will improve DGI to >19/24 to indicate decreased risk of falls with household and community walking tasks    Baseline 11/23: 11/24 1/19:12/24  Time 8    Period Weeks    Status Partially Met    Target Date 10/05/2022     PT LONG TERM GOAL #4   Title Pt. Will increase R hip flexion/ LE muscle strength 1/2 muscle grade to improve standing tolerance/ walking endurance.    Baseline See above   Time 8    Period Weeks    Status Partially Met    Target Date 10/05/22              Plan -     Clinical Impression Statement Pt. Works hard during tx. Session with prolonged standing/ walking tasks with few seated rest breaks required.  Increase SOB during walking tasks/ Nustep.  Good recip. Gait pattern/ proprioception during cone taps and hurdles. No LOB during tx. And pt. Does benefit from rollator to improve walking cadence outside as compared to [facility].   Pt will continue to benefit from skilled PT services to progress strength and mobility, along with cardiovascular endurance.    Personal Factors and Comorbidities Comorbidity 2    Comorbidities Hypertension, Obesity    Examination-Activity Limitations Bed Mobility;Reach Overhead;Squat;Lift;Dressing;Hygiene/Grooming    Examination-Participation Restrictions Community Activity;Yard Work    Merchant [employer] officer Evolving/Moderate complexity    Clinical Decision Making Moderate    Rehab Potential Fair    PT Frequency 1x / week    PT Duration 8 weeks    PT Treatment/Interventions ADLs/Self Care Home Management;Gait training;Stair  training;Functional mobility training;Therapeutic activities;Therapeutic exercise;Balance training;Neuromuscular re-education;Manual techniques;Patient/family education;Electrical Stimulation;Moist Heat;Cryotherapy    PT Next Visit Plan Progress LE strength/ endurance/ pain mgmt.   PT Home Exercise Plan Shoulder pulleys in flex, scaption, abd, pendelums, scap retractions with resistance (RTB), D2 flexion pattern in sitting no resistance.    Consulted and Agree with Plan of Care Patient          Pura Spice, PT, DPT # [phone] 09/05/2022, 3:59 PM

## 2022-09-06 DIAGNOSIS — R251 Tremor, unspecified: Secondary | ICD-10-CM | POA: Diagnosis not present

## 2022-09-06 DIAGNOSIS — I10 Essential (primary) hypertension: Secondary | ICD-10-CM | POA: Diagnosis not present

## 2022-09-06 DIAGNOSIS — Z8673 Personal history of transient ischemic attack (TIA), and cerebral infarction without residual deficits: Secondary | ICD-10-CM | POA: Diagnosis not present

## 2022-09-06 DIAGNOSIS — G4733 Obstructive sleep apnea (adult) (pediatric): Secondary | ICD-10-CM | POA: Diagnosis not present

## 2022-09-06 DIAGNOSIS — I4891 Unspecified atrial fibrillation: Secondary | ICD-10-CM | POA: Diagnosis not present

## 2022-09-06 DIAGNOSIS — Z87898 Personal history of other specified conditions: Secondary | ICD-10-CM | POA: Diagnosis not present

## 2022-09-07 ENCOUNTER — Encounter: Payer: Self-pay | Admitting: Urology

## 2022-09-07 ENCOUNTER — Ambulatory Visit (INDEPENDENT_AMBULATORY_CARE_PROVIDER_SITE_OTHER): Payer: Medicare Other | Admitting: Urology

## 2022-09-07 VITALS — BP 148/80 | HR 74 | Ht 70.0 in | Wt 300.0 lb

## 2022-09-07 DIAGNOSIS — N200 Calculus of kidney: Secondary | ICD-10-CM | POA: Diagnosis not present

## 2022-09-07 DIAGNOSIS — R310 Gross hematuria: Secondary | ICD-10-CM | POA: Diagnosis not present

## 2022-09-07 NOTE — Progress Notes (Signed)
   09/07/22  CC:  Chief Complaint  Patient presents with   Cysto    HPI: History of gross hematuria.  See prior office note 08/05/2022.  CTU 09/02/2022 with bilateral, nonobstructing renal calculi; small left renal cyst.  Prostate volume calculated ~ 70 cc  See rooming tab for vitals   Cystoscopy Procedure Note  Patient identification was confirmed, informed consent was obtained, and patient was prepped using Betadine solution.  Lidocaine jelly was administered per urethral meatus.     Pre-Procedure: - Inspection reveals a normal caliber urethral meatus.  Procedure: The flexible cystoscope was introduced without difficulty - No urethral strictures/lesions are present. - Prominent lateral lobe enlargement with hypervascularity/friability prostate  -Mild elevation bladder neck - Bilateral ureteral orifices identified - Bladder mucosa  reveals no ulcers, tumors, or lesions - No bladder stones - Mild trabeculation  Retroflexion shows no lesions or intravesical median lobe   Post-Procedure: - Patient tolerated the procedure well  Assessment/ Plan: Bilateral nephrolithiasis, nonobstructing.  Monitor with periodic KUB Urine cytology sent Hematuria most likely secondary to BPH.  Consider finasteride for recurrent gross hematuria    Abbie Sons, MD

## 2022-09-08 LAB — URINALYSIS, COMPLETE
Bilirubin, UA: NEGATIVE
Glucose, UA: NEGATIVE
Ketones, UA: NEGATIVE
Leukocytes,UA: NEGATIVE
Nitrite, UA: NEGATIVE
Protein,UA: NEGATIVE
RBC, UA: NEGATIVE
Specific Gravity, UA: 1.02 (ref 1.005–1.030)
Urobilinogen, Ur: 0.2 mg/dL (ref 0.2–1.0)
pH, UA: 5.5 (ref 5.0–7.5)

## 2022-09-08 LAB — MICROSCOPIC EXAMINATION: Bacteria, UA: NONE SEEN

## 2022-09-09 ENCOUNTER — Encounter: Payer: Self-pay | Admitting: *Deleted

## 2022-09-09 LAB — CYTOLOGY - NON PAP

## 2022-09-12 ENCOUNTER — Encounter: Payer: Self-pay | Admitting: Physical Therapy

## 2022-09-12 ENCOUNTER — Ambulatory Visit: Payer: Medicare Other | Admitting: Physical Therapy

## 2022-09-12 DIAGNOSIS — M6281 Muscle weakness (generalized): Secondary | ICD-10-CM | POA: Diagnosis not present

## 2022-09-12 DIAGNOSIS — R2681 Unsteadiness on feet: Secondary | ICD-10-CM

## 2022-09-12 DIAGNOSIS — R269 Unspecified abnormalities of gait and mobility: Secondary | ICD-10-CM

## 2022-09-12 DIAGNOSIS — R2689 Other abnormalities of gait and mobility: Secondary | ICD-10-CM

## 2022-09-12 DIAGNOSIS — R279 Unspecified lack of coordination: Secondary | ICD-10-CM | POA: Diagnosis not present

## 2022-09-12 DIAGNOSIS — R262 Difficulty in walking, not elsewhere classified: Secondary | ICD-10-CM | POA: Diagnosis not present

## 2022-09-12 NOTE — Therapy (Signed)
OUTPATIENT PHYSICAL THERAPY TREATMENT   Patient Name: Tom Underwood MRN: [id number] DOB:02/13/1951, 71 y.o., male Today's Date: 09/12/2022  PCP: Steele Sizer, MD REFERRING PROVIDER: Myles Gip, DO   PT End of Session - 09/12/22 1234     Visit Number 18    Number of Visits 22    Date for PT Re-Evaluation 10/05/22    PT Start Time 1303    Activity Tolerance Patient tolerated treatment well;Patient limited by fatigue    Behavior During Therapy [facility] for tasks assessed/performed             1303 to 1400  (57 minutes)   Past Medical History:  Diagnosis Date   Anxiety    Arthritis    Atrial fibrillation ([place])    a.) CHA2DS2-VASc = 4 (age, HTN, CVA x2). b.) s/p 200 J synchronized cardioversion (DCCV) on 12/08/2021. c.) rate/rythum maintained on oral metoprolol tartrate; chronically anticoagulated using rivaroxaban.   BPH (benign prostatic hyperplasia)    Complication of anesthesia    a.)  Intolerant of propofol; causes "skin burning and involuntary muscle twitching"   DDD (degenerative disc disease), lumbar    Decreased libido    Depression    Diverticulitis    Diverticulosis    GERD (gastroesophageal reflux disease)    Hepatic steatosis    History of 2019 novel coronavirus disease (COVID-19) 12/28/2020   History of cardiac murmur in childhood    History of kidney stones    HLD (hyperlipidemia)    HLD (hyperlipidemia)    Hypertension    Hypogonadism in male    Long term current use of anticoagulant    a.) rivaroxaban   OSA on CPAP    Pre-diabetes    Rhinitis, allergic    Stroke ([place]) 07/23/2019   a.) LEFT posterior cor radiata. b.) residual RIGHT sided weakness   Past Surgical History:  Procedure Laterality Date   CARDIOVERSION N/A 12/08/2021   Procedure: CARDIOVERSION;  Surgeon: Nelva Bush, MD;  Location: [facility];  Service: Cardiovascular;  Laterality: N/A;   COLONOSCOPY     EVALUATION UNDER ANESTHESIA WITH HEMORRHOIDECTOMY N/A 04/15/2022    Procedure: EXAM UNDER ANESTHESIA WITH HEMORRHOIDECTOMY;  Surgeon: Benjamine Sprague, DO;  Location: [facility] OR[facility];  Service: General;  Laterality: N/A;   EXTERNAL EAR SURGERY     EYE SURGERY     FINGER SURGERY Right    lateration to 5 th digit   KNEE SURGERY     left eye     RECTAL EXAM UNDER ANESTHESIA N/A 04/23/2022   Procedure: RECTAL EXAM UNDER ANESTHESIA WITH LIGATION OF BLEEDING;  Surgeon: Olean Ree, MD;  Location: [facility] OR[facility];  Service: General;  Laterality: N/A;   Patient Active Problem List   Diagnosis Date Noted   Gastroesophageal reflux disease without esophagitis 05/17/2022   Dysthymia 05/17/2022   History of syncope 05/17/2022   History of hemorrhoidectomy 05/17/2022   Vitamin D deficiency 05/17/2022   Lower extremity edema 05/17/2022   History of rectal bleeding 05/17/2022   Bleeding internal hemorrhoids    Morbid obesity ([place])    Persistent atrial fibrillation ([place]) 07/14/2020   Hypertension    Hemiparesis of right dominant side as late effect of cerebral infarction ([place]) 08/13/2019   History of kidney stones 05/02/2018   ED (erectile dysfunction) 05/02/2018   Hyperglycemia 01/30/2017   Arthritis of knee, degenerative 01/30/2017   Benign prostatic hyperplasia without lower urinary tract symptoms 02/03/2016   Dyslipidemia 08/04/2015   Diverticulitis of large intestine without perforation or abscess  without bleeding 06/30/2015   OSA on CPAP 06/30/2015    REFERRING DIAG: M25.511,M25.512 (ICD-10-CM) - Bilateral shoulder pain, unspecified chronicity M17.2 (ICD-10-CM) - Post-traumatic osteoarthritis of both knees  R26.81 (ICD-10-CM) - Gait instability    THERAPY DIAG:  Difficulty in walking, not elsewhere classified  Muscle weakness (generalized)  Other abnormalities of gait and mobility  Unsteadiness on feet  Abnormality of gait and mobility  Unspecified lack of coordination  PERTINENT HISTORY: See evaluation  PRECAUTIONS: Fall  SUBJECTIVE:  09/12/22:  Pt. Had a  good weekend.  No new complaints.  Pt. States he is having a cardiac procedure next week to decrease medication use.  Discussed returning to Psychotherapist.  Pt. Reports feet/ lower legs "feel heavy" esp. Getting R LE into driver's side of car.   Pt. Is going out of town to [place] with wife (discussed daily walking/ activity).    PAIN:  Are you having pain? Yes: NPRS scale: 7/10 Pain location: B knee Pain description: aching Aggravating factors: prolonged standing/ walking Relieving factors: rest     10/2:  6MWT: 38 feet x 16 laps plus 12 feet (620 feet).  HR: 65 bpm/ O2 sat. 95%.   TODAY'S TREATMENT:   09/12/22:  Nautilus: 60# seated lat. Pull downs/ 40# standing tricep extension 20 each.  Seated rest break after UE there.ex.   Walking in hallway and //-bars with high marching/ STS(4 laps each).   Reassessment of seated hip flexion/ knee extension/ knee flexion 10x each.    Standing 1st step recip. Touches on L/R 20x each.  Light to no UE assist.    Nustep L5 10 min. B UE/LE.  Seat position #10/ arm position #10.    Walking outside with [facility] (working on maintaining a consistent recip.  Pattern/ BOS/ upright posture).     Discussed daily HEP/ walking at home.      PATIENT EDUCATION: Education details: HEP/ daily activity Person educated: Patient Education method: Customer service manager Education comprehension: verbalized understanding and returned demonstration   HOME EXERCISE PROGRAM: Handouts given for shoulder ROM/ strengthening/ standing hip ex.    PT Short Term Goals -      PT SHORT TERM GOAL #1   Title Pt will be indep with HEP to improve strength and balance to reduce risk of falls.    Baseline 11/23: initiated    Time 4    Period Weeks    Status Achieved              PT Long Term Goals -      PT LONG TERM GOAL #1   Title Pt will improve FOTO score to 50 to display improvement in functional mobility.    Baseline 6/7: 33   Time 8    Period Weeks    Status Ongoing   Target Date 10/05/2022     PT LONG TERM GOAL #2   Title Pt wil improve TUG score to < 12 sec with no AD to display significant imporvement in reduced risk of falls    Baseline 11/23: 26 sec (did not sit down immediately leading to higher time than he would've scored) 1/19: 14.44 sec.   6/7: 12.66 sec.    Time 8   Period Weeks    Status Partially Met    Target Date 10/05/22      PT LONG TERM GOAL #3   Title Pt will improve DGI to >19/24 to indicate decreased risk of falls with household and community walking tasks  Baseline 11/23: 11/24 1/19:12/24    Time 8    Period Weeks    Status Partially Met    Target Date 10/05/2022     PT LONG TERM GOAL #4   Title Pt. Will increase R hip flexion/ LE muscle strength 1/2 muscle grade to improve standing tolerance/ walking endurance.    Baseline See above   Time 8    Period Weeks    Status Partially Met    Target Date 10/05/22              Plan -     Clinical Impression Statement Pt. Works hard during tx. Session with prolonged standing/ walking tasks with few seated rest breaks required.  Increase SOB during walking tasks/ Nustep.  Good recip. Gait pattern/ proprioception during cone taps and hurdles. No LOB during tx. And pt. Does benefit from rollator to improve walking cadence outside as compared to [facility].  Pt. Has good vital signs but easily fatigued/ short of breath during repetitive tasks/ walking.   Pt will continue to benefit from skilled PT services to progress strength and mobility, along with cardiovascular endurance.    Personal Factors and Comorbidities Comorbidity 2    Comorbidities Hypertension, Obesity    Examination-Activity Limitations Bed Mobility;Reach Overhead;Squat;Lift;Dressing;Hygiene/Grooming    Examination-Participation Restrictions Community Activity;Yard Work    Merchant [employer] officer Evolving/Moderate complexity    Clinical Decision Making Moderate    Rehab Potential  Fair    PT Frequency 1x / week    PT Duration 8 weeks    PT Treatment/Interventions ADLs/Self Care Home Management;Gait training;Stair training;Functional mobility training;Therapeutic activities;Therapeutic exercise;Balance training;Neuromuscular re-education;Manual techniques;Patient/family education;Electrical Stimulation;Moist Heat;Cryotherapy    PT Next Visit Plan Progress LE strength/ endurance/ pain mgmt.   PT Home Exercise Plan Shoulder pulleys in flex, scaption, abd, pendelums, scap retractions with resistance (RTB), D2 flexion pattern in sitting no resistance.    Consulted and Agree with Plan of Care Patient          Pura Spice, PT, DPT # [phone] 09/12/2022, 1:03 PM

## 2022-09-19 ENCOUNTER — Ambulatory Visit: Payer: Medicare Other | Admitting: Physical Therapy

## 2022-09-19 DIAGNOSIS — R279 Unspecified lack of coordination: Secondary | ICD-10-CM

## 2022-09-19 DIAGNOSIS — R2689 Other abnormalities of gait and mobility: Secondary | ICD-10-CM | POA: Diagnosis not present

## 2022-09-19 DIAGNOSIS — R262 Difficulty in walking, not elsewhere classified: Secondary | ICD-10-CM

## 2022-09-19 DIAGNOSIS — M6281 Muscle weakness (generalized): Secondary | ICD-10-CM

## 2022-09-19 DIAGNOSIS — R2681 Unsteadiness on feet: Secondary | ICD-10-CM

## 2022-09-19 DIAGNOSIS — R269 Unspecified abnormalities of gait and mobility: Secondary | ICD-10-CM | POA: Diagnosis not present

## 2022-09-19 NOTE — Therapy (Signed)
OUTPATIENT PHYSICAL THERAPY TREATMENT   Patient Name: Tom Underwood MRN: [id number] DOB:[birth year], 71 y.o., male Today's Date: 09/19/2022  PCP: Steele Sizer, MD REFERRING PROVIDER: Myles Gip, DO   PT End of Session - 09/19/22 1259     Visit Number 19    Number of Visits 22    Date for PT Re-Evaluation 10/05/22    PT Start Time 1300    PT Stop Time 1404    PT Time Calculation (min) 64 min    Activity Tolerance Patient tolerated treatment well;Patient limited by fatigue    Behavior During Therapy WFL for tasks assessed/performed              Past Medical History:  Diagnosis Date   Anxiety    Arthritis    Atrial fibrillation ([place])    a.) CHA2DS2-VASc = 4 (age, HTN, CVA x2). b.) s/p 200 J synchronized cardioversion (DCCV) on 12/08/2021. c.) rate/rythum maintained on oral metoprolol tartrate; chronically anticoagulated using rivaroxaban.   BPH (benign prostatic hyperplasia)    Complication of anesthesia    a.)  Intolerant of propofol; causes "skin burning and involuntary muscle twitching"   DDD (degenerative disc disease), lumbar    Decreased libido    Depression    Diverticulitis    Diverticulosis    GERD (gastroesophageal reflux disease)    Hepatic steatosis    History of 2019 novel coronavirus disease (COVID-19) 12/28/2020   History of cardiac murmur in childhood    History of kidney stones    HLD (hyperlipidemia)    HLD (hyperlipidemia)    Hypertension    Hypogonadism in male    Long term current use of anticoagulant    a.) rivaroxaban   OSA on CPAP    Pre-diabetes    Rhinitis, allergic    Stroke ([place]) 07/23/2019   a.) LEFT posterior cor radiata. b.) residual RIGHT sided weakness   Past Surgical History:  Procedure Laterality Date   CARDIOVERSION N/A 12/08/2021   Procedure: CARDIOVERSION;  Surgeon: Nelva Bush, MD;  Location: [facility];  Service: Cardiovascular;  Laterality: N/A;   COLONOSCOPY     EVALUATION UNDER ANESTHESIA WITH  HEMORRHOIDECTOMY N/A 04/15/2022   Procedure: EXAM UNDER ANESTHESIA WITH HEMORRHOIDECTOMY;  Surgeon: Benjamine Sprague, DO;  Location: [facility];  Service: General;  Laterality: N/A;   EXTERNAL EAR SURGERY     EYE SURGERY     FINGER SURGERY Right    lateration to 5 th digit   KNEE SURGERY     left eye     RECTAL EXAM UNDER ANESTHESIA N/A 04/23/2022   Procedure: RECTAL EXAM UNDER ANESTHESIA WITH LIGATION OF BLEEDING;  Surgeon: Olean Ree, MD;  Location: [facility];  Service: General;  Laterality: N/A;   Patient Active Problem List   Diagnosis Date Noted   Gastroesophageal reflux disease without esophagitis 05/17/2022   Dysthymia 05/17/2022   History of syncope 05/17/2022   History of hemorrhoidectomy 05/17/2022   Vitamin D deficiency 05/17/2022   Lower extremity edema 05/17/2022   History of rectal bleeding 05/17/2022   Bleeding internal hemorrhoids    Morbid obesity ([place])    Persistent atrial fibrillation ([place]) 07/14/2020   Hypertension    Hemiparesis of right dominant side as late effect of cerebral infarction ([place]) 08/13/2019   History of kidney stones 05/02/2018   ED (erectile dysfunction) 05/02/2018   Hyperglycemia 01/30/2017   Arthritis of knee, degenerative 01/30/2017   Benign prostatic hyperplasia without lower urinary tract symptoms 02/03/2016   Dyslipidemia 08/04/2015  Diverticulitis of large intestine without perforation or abscess without bleeding 06/30/2015   OSA on CPAP 06/30/2015    REFERRING DIAG: M25.511,M25.512 (ICD-10-CM) - Bilateral shoulder pain, unspecified chronicity M17.2 (ICD-10-CM) - Post-traumatic osteoarthritis of both knees  R26.81 (ICD-10-CM) - Gait instability    THERAPY DIAG:  Difficulty in walking, not elsewhere classified  Muscle weakness (generalized)  Other abnormalities of gait and mobility  Unsteadiness on feet  Abnormality of gait and mobility  Unspecified lack of coordination  PERTINENT HISTORY: See evaluation  PRECAUTIONS:  Fall   10/2:  6MWT: 38 feet x 16 laps plus 12 feet (620 feet).  HR: 65 bpm/ O2 sat. 95%.    SUBJECTIVE:  09/19/22:  Pt. [place]  Pt. Did more walking than usual while visiting shops/ restaurants in [place].  Pt. Waiting to hear back about appt. With Psychotherapist.  Pt. Reports lower legs "feel like a 20# weight" while lifting legs/walking.       PAIN:  Are you having pain? Yes: NPRS scale: 7/10 Pain location: B knee Pain description: aching Aggravating factors: prolonged standing/ walking Relieving factors: rest   TODAY'S TREATMENT:   09/19/22:  Nustep L5 10 min. B UE/LE.  Seat position #10/ arm position #10.  HR 82 bpm.  O2 sat: 96%  Nautilus: 60# seated lat. Pull downs/ 40# standing tricep extension 20 each.  Seated rest break after UE there.ex.  Walking in gym with focus on step pattern/ heel strike while carrying SPC.    Walking in //-bars: forward/ lateral 6x each prior to seated rest break.  Increase SOB.    Agility ladder: walking cone taps with recip. Touches.  1 mistake but no LOB.    Walking outside with [facility] (working on maintaining a consistent recip.  Pattern/ BOS/ upright posture).    Discussed daily HEP/ walking at home.      PATIENT EDUCATION: Education details: HEP/ daily activity Person educated: Patient Education method: Customer service manager Education comprehension: verbalized understanding and returned demonstration   HOME EXERCISE PROGRAM: Handouts given for shoulder ROM/ strengthening/ standing hip ex.    PT Short Term Goals -      PT SHORT TERM GOAL #1   Title Pt will be indep with HEP to improve strength and balance to reduce risk of falls.    Baseline 11/23: initiated    Time 4    Period Weeks    Status Achieved              PT Long Term Goals -      PT LONG TERM GOAL #1   Title Pt will improve FOTO score to 50 to display improvement in functional mobility.    Baseline 6/7: 33    Time 8   Period Weeks    Status Ongoing   Target Date 10/05/2022     PT LONG TERM GOAL #2   Title Pt wil improve TUG score to < 12 sec with no AD to display significant imporvement in reduced risk of falls    Baseline 11/23: 26 sec (did not sit down immediately leading to higher time than he would've scored) 1/19: 14.44 sec.   6/7: 12.66 sec.    Time 8   Period Weeks    Status Partially Met    Target Date 10/05/22      PT LONG TERM GOAL #3   Title Pt will improve DGI to >19/24 to indicate decreased risk of falls with household and community  walking tasks    Baseline 11/23: 11/24 1/19:12/24    Time 8    Period Weeks    Status Partially Met    Target Date 10/05/2022     PT LONG TERM GOAL #4   Title Pt. Will increase R hip flexion/ LE muscle strength 1/2 muscle grade to improve standing tolerance/ walking endurance.    Baseline See above   Time 8    Period Weeks    Status Partially Met    Target Date 10/05/22            Plan -     Clinical Impression Statement Pt. Easily fatigued during tx. Session and challenged with cone taps/ increase reps in //-bars. Increase SOB during walking tasks/ Nustep.  No LOB during tx. And pt. Does benefit from assistive device due to fatigue/ LE swelling and weakness.  No change to HEP.  Pt will continue to benefit from skilled PT services to progress strength and mobility, along with cardiovascular endurance.    Personal Factors and Comorbidities Comorbidity 2    Comorbidities Hypertension, Obesity    Examination-Activity Limitations Bed Mobility;Reach Overhead;Squat;Lift;Dressing;Hygiene/Grooming    Examination-Participation Restrictions Community Activity;Yard Work    Merchant navy officer Evolving/Moderate complexity    Clinical Decision Making Moderate    Rehab Potential Fair    PT Frequency 1x / week    PT Duration 8 weeks    PT Treatment/Interventions ADLs/Self Care Home Management;Gait training;Stair training;Functional  mobility training;Therapeutic activities;Therapeutic exercise;Balance training;Neuromuscular re-education;Manual techniques;Patient/family education;Electrical Stimulation;Moist Heat;Cryotherapy    PT Next Visit Plan Progress LE strength/ endurance/ pain mgmt.  10th visit progress noted next tx. Session.     PT Home Exercise Plan Shoulder pulleys in flex, scaption, abd, pendelums, scap retractions with resistance (RTB), D2 flexion pattern in sitting no resistance.    Consulted and Agree with Plan of Care Patient          Pura Spice, PT, DPT # [id number] 09/19/2022, 6:15 PM

## 2022-09-26 ENCOUNTER — Encounter: Payer: Self-pay | Admitting: Physical Therapy

## 2022-09-26 ENCOUNTER — Ambulatory Visit: Payer: Medicare Other | Admitting: Physical Therapy

## 2022-09-26 DIAGNOSIS — M6281 Muscle weakness (generalized): Secondary | ICD-10-CM | POA: Diagnosis not present

## 2022-09-26 DIAGNOSIS — R269 Unspecified abnormalities of gait and mobility: Secondary | ICD-10-CM

## 2022-09-26 DIAGNOSIS — R2681 Unsteadiness on feet: Secondary | ICD-10-CM | POA: Diagnosis not present

## 2022-09-26 DIAGNOSIS — R2689 Other abnormalities of gait and mobility: Secondary | ICD-10-CM

## 2022-09-26 DIAGNOSIS — M25611 Stiffness of right shoulder, not elsewhere classified: Secondary | ICD-10-CM

## 2022-09-26 DIAGNOSIS — R262 Difficulty in walking, not elsewhere classified: Secondary | ICD-10-CM

## 2022-09-26 DIAGNOSIS — R279 Unspecified lack of coordination: Secondary | ICD-10-CM

## 2022-09-26 NOTE — Therapy (Signed)
OUTPATIENT PHYSICAL THERAPY TREATMENT Physical Therapy Progress Note   Dates of reporting period  07/21/22  to  09/26/22    Patient Name: Tom Underwood MRN: [id number] DOB:[birth year], 71 y.o., male Today's Date: 09/26/2022  PCP: Alba Cory, MD REFERRING PROVIDER: Caro Laroche, DO   PT End of Session - 09/26/22 1300     Visit Number 20    Number of Visits 22    Date for PT Re-Evaluation 10/05/22    PT Start Time 1301    PT Stop Time 1404    PT Time Calculation (min) 63 min    Activity Tolerance Patient tolerated treatment well;Patient limited by fatigue    Behavior During Therapy WFL for tasks assessed/performed              Past Medical History:  Diagnosis Date   Anxiety    Arthritis    Atrial fibrillation ([facility])    a.) CHA2DS2-VASc = 4 (age, HTN, CVA x2). b.) s/p 200 J synchronized cardioversion (DCCV) on 12/08/2021. c.) rate/rythum maintained on oral metoprolol tartrate; chronically anticoagulated using rivaroxaban.   BPH (benign prostatic hyperplasia)    Complication of anesthesia    a.)  Intolerant of propofol; causes "skin burning and involuntary muscle twitching"   DDD (degenerative disc disease), lumbar    Decreased libido    Depression    Diverticulitis    Diverticulosis    GERD (gastroesophageal reflux disease)    Hepatic steatosis    History of 2019 novel coronavirus disease (COVID-19) 12/28/2020   History of cardiac murmur in childhood    History of kidney stones    HLD (hyperlipidemia)    HLD (hyperlipidemia)    Hypertension    Hypogonadism in male    Long term current use of anticoagulant    a.) rivaroxaban   OSA on CPAP    Pre-diabetes    Rhinitis, allergic    Stroke ([facility]) 07/23/2019   a.) LEFT posterior cor radiata. b.) residual RIGHT sided weakness   Past Surgical History:  Procedure Laterality Date   CARDIOVERSION N/A 12/08/2021   Procedure: CARDIOVERSION;  Surgeon: Yvonne Kendall, MD;  Location: [facility];  Service:  Cardiovascular;  Laterality: N/A;   COLONOSCOPY     EVALUATION UNDER ANESTHESIA WITH HEMORRHOIDECTOMY N/A 04/15/2022   Procedure: EXAM UNDER ANESTHESIA WITH HEMORRHOIDECTOMY;  Surgeon: Sung Amabile, DO;  Location: [facility];  Service: General;  Laterality: N/A;   EXTERNAL EAR SURGERY     EYE SURGERY     FINGER SURGERY Right    lateration to 5 th digit   KNEE SURGERY     left eye     RECTAL EXAM UNDER ANESTHESIA N/A 04/23/2022   Procedure: RECTAL EXAM UNDER ANESTHESIA WITH LIGATION OF BLEEDING;  Surgeon: Henrene Dodge, MD;  Location: [facility];  Service: General;  Laterality: N/A;   Patient Active Problem List   Diagnosis Date Noted   Gastroesophageal reflux disease without esophagitis 05/17/2022   Dysthymia 05/17/2022   History of syncope 05/17/2022   History of hemorrhoidectomy 05/17/2022   Vitamin D deficiency 05/17/2022   Lower extremity edema 05/17/2022   History of rectal bleeding 05/17/2022   Bleeding internal hemorrhoids    Morbid obesity ([facility])    Persistent atrial fibrillation ([facility]) 07/14/2020   Hypertension    Hemiparesis of right dominant side as late effect of cerebral infarction ([facility]) 08/13/2019   History of kidney stones 05/02/2018   ED (erectile dysfunction) 05/02/2018   Hyperglycemia 01/30/2017   Arthritis of knee, degenerative  01/30/2017   Benign prostatic hyperplasia without lower urinary tract symptoms 02/03/2016   Dyslipidemia 08/04/2015   Diverticulitis of large intestine without perforation or abscess without bleeding 06/30/2015   OSA on CPAP 06/30/2015    REFERRING DIAG: M25.511,M25.512 (ICD-10-CM) - Bilateral shoulder pain, unspecified chronicity M17.2 (ICD-10-CM) - Post-traumatic osteoarthritis of both knees  R26.81 (ICD-10-CM) - Gait instability    THERAPY DIAG:  Difficulty in walking, not elsewhere classified  Muscle weakness (generalized)  Other abnormalities of gait and mobility  Unsteadiness on feet  Abnormality of gait and  mobility  Unspecified lack of coordination  Decreased range of motion of right shoulder  PERTINENT HISTORY: See evaluation  PRECAUTIONS: Fall   10/2:  : 38 feet x 16 laps plus 12 feet (620 feet).  HR: 65 bpm/ O2 sat. 95%.    SUBJECTIVE:  09/26/22:  Pt. Reports lower legs feel really stiff while lifting legs/walking.  No falls or stumbles reported.      PAIN:  Are you having pain? Yes: NPRS scale: 7-8/10 Pain location: B knee Pain description: aching Aggravating factors: prolonged standing/ walking Relieving factors: rest   TODAY'S TREATMENT:   09/26/22:  Seated 4# ankle wts.: LAQ/marching/heel and toe raises 20x.  Standing marching/ lateral walking/ hamstring curls 20x.  Walking in gym with ankle wts. And cuing to increase step pattern/ length.    Nautilus: 60# seated lat. Pull downs/ 40# standing tricep extension 20 each.  Seated rest break after UE there.ex.  Walking in gym with focus on step pattern/ heel strike while carrying SPC.    Walking in //-bars: forward/ lateral 6x each prior to seated rest break.  Increase SOB.    Agility ladder: recip. Step pattern with visual guidance/ heel strike.  No SPC assist and verbal cuing for proper technique with proprioceptive tasks.    Nustep L5 10 min. B UE/LE.  Seat position #10/ arm position #10.  HR 82 bpm.  O2 sat: 96%  Walking outside with [facility] (working on maintaining a consistent recip.  Pattern/ BOS/ upright posture).      PATIENT EDUCATION: Education details: HEP/ daily activity Person educated: Patient Education method: Medical illustrator Education comprehension: verbalized understanding and returned demonstration   HOME EXERCISE PROGRAM: Handouts given for shoulder ROM/ strengthening/ standing hip ex.    PT Short Term Goals -      PT SHORT TERM GOAL #1   Title Pt will be indep with HEP to improve strength and balance to reduce risk of falls.    Baseline 11/23: initiated    Time 4    Period  Weeks    Status Achieved              PT Long Term Goals -      PT LONG TERM GOAL #1   Title Pt will improve FOTO score to 50 to display improvement in functional mobility.    Baseline 6/7: 33.  10/23: 52   Time 8   Period Weeks    Status Goal met on 10/23   Target Date 10/05/2022     PT LONG TERM GOAL #2   Title Pt wil improve TUG score to < 12 sec with no AD to display significant imporvement in reduced risk of falls    Baseline 11/23: 26 sec (did not sit down immediately leading to higher time than he would've scored) 1/19: 14.44 sec.   6/7: 12.66 sec.    Time 8   Period Weeks    Status Partially  Met    Target Date 10/05/22      PT LONG TERM GOAL #3   Title Pt will improve DGI to >19/24 to indicate decreased risk of falls with household and community walking tasks    Baseline 11/23: 11/24 1/19:12/24    Time 8    Period Weeks    Status Partially Met    Target Date 10/05/2022     PT LONG TERM GOAL #4   Title Pt. Will increase R hip flexion/ LE muscle strength 1/2 muscle grade to improve standing tolerance/ walking endurance.    Baseline See above   Time 8    Period Weeks    Status Partially Met    Target Date 10/05/22            Plan -     Clinical Impression Statement Pt. Easily fatigued during tx. Session and challenged with cone taps/ increase reps in //-bars. Increase SOB during walking tasks/ Nustep.  No LOB during tx. And pt. Does benefit from assistive device due to fatigue/ LE swelling and weakness.  No change to HEP.  Pt will continue to benefit from skilled PT services to progress strength and mobility, along with cardiovascular endurance.    Personal Factors and Comorbidities Comorbidity 2    Comorbidities Hypertension, Obesity    Examination-Activity Limitations Bed Mobility;Reach Overhead;Squat;Lift;Dressing;Hygiene/Grooming    Examination-Participation Restrictions Community Activity;Yard Work    Conservation officer, historic buildings Evolving/Moderate  complexity    Clinical Decision Making Moderate    Rehab Potential Fair    PT Frequency 1x / week    PT Duration 8 weeks    PT Treatment/Interventions ADLs/Self Care Home Management;Gait training;Stair training;Functional mobility training;Therapeutic activities;Therapeutic exercise;Balance training;Neuromuscular re-education;Manual techniques;Patient/family education;Electrical Stimulation;Moist Heat;Cryotherapy    PT Next Visit Plan Progress LE strength/ endurance/ pain mgmt.      PT Home Exercise Plan Shoulder pulleys in flex, scaption, abd, pendelums, scap retractions with resistance (RTB), D2 flexion pattern in sitting no resistance.    Consulted and Agree with Plan of Care Patient          Cammie Mcgee, PT, DPT # [phone] 09/26/2022, 7:27 PM

## 2022-10-03 ENCOUNTER — Ambulatory Visit: Payer: Medicare Other | Admitting: Physical Therapy

## 2022-10-03 DIAGNOSIS — R269 Unspecified abnormalities of gait and mobility: Secondary | ICD-10-CM | POA: Diagnosis not present

## 2022-10-03 DIAGNOSIS — M6281 Muscle weakness (generalized): Secondary | ICD-10-CM | POA: Diagnosis not present

## 2022-10-03 DIAGNOSIS — R262 Difficulty in walking, not elsewhere classified: Secondary | ICD-10-CM

## 2022-10-03 DIAGNOSIS — R279 Unspecified lack of coordination: Secondary | ICD-10-CM | POA: Diagnosis not present

## 2022-10-03 DIAGNOSIS — R2689 Other abnormalities of gait and mobility: Secondary | ICD-10-CM

## 2022-10-03 DIAGNOSIS — R2681 Unsteadiness on feet: Secondary | ICD-10-CM

## 2022-10-03 NOTE — Therapy (Signed)
OUTPATIENT PHYSICAL THERAPY TREATMENT   Patient Name: Tom Underwood MRN: [id number] DOB:1951-12-01, 71 y.o., male Today's Date: 10/03/2022  PCP: Steele Sizer, MD REFERRING PROVIDER: Myles Gip, DO   PT End of Session - 10/03/22 1244     Visit Number 21    Number of Visits 22    Date for PT Re-Evaluation 10/05/22    PT Start Time 1244    PT Stop Time 1346    PT Time Calculation (min) 62 min    Activity Tolerance Patient tolerated treatment well;Patient limited by fatigue    Behavior During Therapy WFL for tasks assessed/performed              Past Medical History:  Diagnosis Date   Anxiety    Arthritis    Atrial fibrillation ([place])    a.) CHA2DS2-VASc = 4 (age, HTN, CVA x2). b.) s/p 200 J synchronized cardioversion (DCCV) on 12/08/2021. c.) rate/rythum maintained on oral metoprolol tartrate; chronically anticoagulated using rivaroxaban.   BPH (benign prostatic hyperplasia)    Complication of anesthesia    a.)  Intolerant of propofol; causes "skin burning and involuntary muscle twitching"   DDD (degenerative disc disease), lumbar    Decreased libido    Depression    Diverticulitis    Diverticulosis    GERD (gastroesophageal reflux disease)    Hepatic steatosis    History of 2019 novel coronavirus disease (COVID-19) 12/28/2020   History of cardiac murmur in childhood    History of kidney stones    HLD (hyperlipidemia)    HLD (hyperlipidemia)    Hypertension    Hypogonadism in male    Long term current use of anticoagulant    a.) rivaroxaban   OSA on CPAP    Pre-diabetes    Rhinitis, allergic    Stroke ([place]) 07/23/2019   a.) LEFT posterior cor radiata. b.) residual RIGHT sided weakness   Past Surgical History:  Procedure Laterality Date   CARDIOVERSION N/A 12/08/2021   Procedure: CARDIOVERSION;  Surgeon: Nelva Bush, MD;  Location: [facility];  Service: Cardiovascular;  Laterality: N/A;   COLONOSCOPY     EVALUATION UNDER ANESTHESIA WITH  HEMORRHOIDECTOMY N/A 04/15/2022   Procedure: EXAM UNDER ANESTHESIA WITH HEMORRHOIDECTOMY;  Surgeon: Benjamine Sprague, DO;  Location: [facility];  Service: General;  Laterality: N/A;   EXTERNAL EAR SURGERY     EYE SURGERY     FINGER SURGERY Right    lateration to 5 th digit   KNEE SURGERY     left eye     RECTAL EXAM UNDER ANESTHESIA N/A 04/23/2022   Procedure: RECTAL EXAM UNDER ANESTHESIA WITH LIGATION OF BLEEDING;  Surgeon: Olean Ree, MD;  Location: [facility];  Service: General;  Laterality: N/A;   Patient Active Problem List   Diagnosis Date Noted   Gastroesophageal reflux disease without esophagitis 05/17/2022   Dysthymia 05/17/2022   History of syncope 05/17/2022   History of hemorrhoidectomy 05/17/2022   Vitamin D deficiency 05/17/2022   Lower extremity edema 05/17/2022   History of rectal bleeding 05/17/2022   Bleeding internal hemorrhoids    Morbid obesity ([place])    Persistent atrial fibrillation ([place]) 07/14/2020   Hypertension    Hemiparesis of right dominant side as late effect of cerebral infarction ([place]) 08/13/2019   History of kidney stones 05/02/2018   ED (erectile dysfunction) 05/02/2018   Hyperglycemia 01/30/2017   Arthritis of knee, degenerative 01/30/2017   Benign prostatic hyperplasia without lower urinary tract symptoms 02/03/2016   Dyslipidemia 08/04/2015  Diverticulitis of large intestine without perforation or abscess without bleeding 06/30/2015   OSA on CPAP 06/30/2015    REFERRING DIAG: M25.511,M25.512 (ICD-10-CM) - Bilateral shoulder pain, unspecified chronicity M17.2 (ICD-10-CM) - Post-traumatic osteoarthritis of both knees  R26.81 (ICD-10-CM) - Gait instability    THERAPY DIAG:  Difficulty in walking, not elsewhere classified  Muscle weakness (generalized)  Other abnormalities of gait and mobility  Unsteadiness on feet  Abnormality of gait and mobility  Unspecified lack of coordination  PERTINENT HISTORY: See evaluation  PRECAUTIONS:  Fall   10/2:  6MWT: 38 feet x 16 laps plus 12 feet (620 feet).  HR: 65 bpm/ O2 sat. 95%.    SUBJECTIVE:  10/03/22:  Pt. Reports 7/10 pain in L knee and 6/10 in R LE/lower legs.  Pt. Reports walking a lot this morning to [employer] to deposit settlement check.     PAIN:  Are you having pain? Yes: NPRS scale: 6-7/10 Pain location: B knee Pain description: aching Aggravating factors: prolonged standing/ walking Relieving factors: rest   TODAY'S TREATMENT:   10/03/22:  Seated 4# ankle wts.: LAQ/marching/heel and toe raises 20x.  Standing marching/ lateral walking/ hamstring curls 20x.  Walking in //-bars (forward/ lateral) with 4# ankle wts. And cuing to increase step pattern/ length (5 laps).  Increase SOB and seated rest break prior to walking to [employer].    [place]: 60# seated lat. Pull downs/ 40# standing tricep extension 20 each.  Seated rest break after UE there.ex.    Walking in gym with focus on step pattern/ heel strike while carrying SPC.  6" step touches (alt. LE)- 20x.  Moderate fatigue/ SOB noted.    Agility ladder: recip. Step pattern with visual guidance/ heel strike.  No SPC assist and verbal cuing for proper technique with proprioceptive tasks.    Nustep L5 10 min. B UE/LE.  Seat position #10/ arm position #10.    Walking outside with [facility] (working on maintaining a consistent recip.  Pattern/ BOS/ upright posture).      PATIENT EDUCATION: Education details: HEP/ daily activity Person educated: Patient Education method: Customer service manager Education comprehension: verbalized understanding and returned demonstration   HOME EXERCISE PROGRAM: Handouts given for shoulder ROM/ strengthening/ standing hip ex.    PT Short Term Goals -      PT SHORT TERM GOAL #1   Title Pt will be indep with HEP to improve strength and balance to reduce risk of falls.    Baseline 11/23: initiated    Time 4    Period Weeks    Status Achieved               PT Long Term Goals -      PT LONG TERM GOAL #1   Title Pt will improve FOTO score to 50 to display improvement in functional mobility.    Baseline 6/7: 33.  10/23: 52   Time 8   Period Weeks    Status Goal met on 10/23   Target Date 10/05/2022     PT LONG TERM GOAL #2   Title Pt wil improve TUG score to < 12 sec with no AD to display significant imporvement in reduced risk of falls    Baseline 11/23: 26 sec (did not sit down immediately leading to higher time than he would've scored) 1/19: 14.44 sec.   6/7: 12.66 sec.    Time 8   Period Weeks    Status Partially Met    Target Date 10/05/22  PT LONG TERM GOAL #3   Title Pt will improve DGI to >19/24 to indicate decreased risk of falls with household and community walking tasks    Baseline 11/23: 11/24 1/19:12/24    Time 8    Period Weeks    Status Partially Met    Target Date 10/05/2022     PT LONG TERM GOAL #4   Title Pt. Will increase R hip flexion/ LE muscle strength 1/2 muscle grade to improve standing tolerance/ walking endurance.    Baseline See above   Time 8    Period Weeks    Status Partially Met    Target Date 10/05/22            Plan -     Clinical Impression Statement Pt. Works hard during tx. Session and requires 2 seated rest breaks after there.ex. secondary to fatigue/ SOB.  No LOB during tx. And pt. Does benefit from assistive device due to fatigue/ LE swelling and weakness.  Pt. Presents with less overall knee/lower leg pain but remains swollen/ pitting edema.  Pt will continue to benefit from skilled PT services to progress strength and mobility, along with cardiovascular endurance.    Personal Factors and Comorbidities Comorbidity 2    Comorbidities Hypertension, Obesity    Examination-Activity Limitations Bed Mobility;Reach Overhead;Squat;Lift;Dressing;Hygiene/Grooming    Examination-Participation Restrictions Community Activity;Yard Work    [employer]chant [employer] officer Evolving/Moderate  complexity    Clinical Decision Making Moderate    Rehab Potential Fair    PT Frequency 1x / week    PT Duration 8 weeks    PT Treatment/Interventions ADLs/Self Care Home Management;Gait training;Stair training;Functional mobility training;Therapeutic activities;Therapeutic exercise;Balance training;Neuromuscular re-education;Manual techniques;Patient/family education;Electrical Stimulation;Moist Heat;Cryotherapy    PT Next Visit Plan Progress LE strength/ endurance/ pain mgmt.   CHECK GOALS/ RECERT NEXT TX   PT Home Exercise Plan Shoulder pulleys in flex, scaption, abd, pendelums, scap retractions with resistance (RTB), D2 flexion pattern in sitting no resistance.    Consulted and Agree with Plan of Care Patient          Pura Spice, PT, DPT # [phone] 10/03/2022, 2:13 PM

## 2022-10-10 ENCOUNTER — Ambulatory Visit: Payer: Medicare Other | Attending: Family Medicine | Admitting: Physical Therapy

## 2022-10-10 ENCOUNTER — Encounter: Payer: Self-pay | Admitting: Physical Therapy

## 2022-10-10 DIAGNOSIS — R279 Unspecified lack of coordination: Secondary | ICD-10-CM | POA: Diagnosis not present

## 2022-10-10 DIAGNOSIS — R269 Unspecified abnormalities of gait and mobility: Secondary | ICD-10-CM | POA: Diagnosis not present

## 2022-10-10 DIAGNOSIS — M25611 Stiffness of right shoulder, not elsewhere classified: Secondary | ICD-10-CM | POA: Diagnosis not present

## 2022-10-10 DIAGNOSIS — R2689 Other abnormalities of gait and mobility: Secondary | ICD-10-CM | POA: Insufficient documentation

## 2022-10-10 DIAGNOSIS — M6281 Muscle weakness (generalized): Secondary | ICD-10-CM | POA: Diagnosis not present

## 2022-10-10 DIAGNOSIS — R2681 Unsteadiness on feet: Secondary | ICD-10-CM | POA: Diagnosis not present

## 2022-10-10 DIAGNOSIS — R262 Difficulty in walking, not elsewhere classified: Secondary | ICD-10-CM | POA: Insufficient documentation

## 2022-10-10 NOTE — Therapy (Signed)
OUTPATIENT PHYSICAL THERAPY TREATMENT/ RECERTIFICATION   Patient Name: Tom Underwood MRN: [id number] DOB:Dec 30, 1950, 71 y.o., male Today's Date: 10/10/2022  PCP: Steele Sizer, MD REFERRING PROVIDER: Myles Gip, DO   PT End of Session - 10/10/22 1304     Visit Number 22    Number of Visits 30    Date for PT Re-Evaluation 12/05/22    PT Start Time 3500    PT Stop Time 1353    PT Time Calculation (min) 55 min    Activity Tolerance Patient tolerated treatment well;Patient limited by fatigue    Behavior During Therapy WFL for tasks assessed/performed              Past Medical History:  Diagnosis Date   Anxiety    Arthritis    Atrial fibrillation ([place])    a.) CHA2DS2-VASc = 4 (age, HTN, CVA x2). b.) s/p 200 J synchronized cardioversion (DCCV) on 12/08/2021. c.) rate/rythum maintained on oral metoprolol tartrate; chronically anticoagulated using rivaroxaban.   BPH (benign prostatic hyperplasia)    Complication of anesthesia    a.)  Intolerant of propofol; causes "skin burning and involuntary muscle twitching"   DDD (degenerative disc disease), lumbar    Decreased libido    Depression    Diverticulitis    Diverticulosis    GERD (gastroesophageal reflux disease)    Hepatic steatosis    History of 2019 novel coronavirus disease (COVID-19) 12/28/2020   History of cardiac murmur in childhood    History of kidney stones    HLD (hyperlipidemia)    HLD (hyperlipidemia)    Hypertension    Hypogonadism in male    Long term current use of anticoagulant    a.) rivaroxaban   OSA on CPAP    Pre-diabetes    Rhinitis, allergic    Stroke ([place]) 07/23/2019   a.) LEFT posterior cor radiata. b.) residual RIGHT sided weakness   Past Surgical History:  Procedure Laterality Date   CARDIOVERSION N/A 12/08/2021   Procedure: CARDIOVERSION;  Surgeon: Nelva Bush, MD;  Location: [facility];  Service: Cardiovascular;  Laterality: N/A;   COLONOSCOPY     EVALUATION UNDER  ANESTHESIA WITH HEMORRHOIDECTOMY N/A 04/15/2022   Procedure: EXAM UNDER ANESTHESIA WITH HEMORRHOIDECTOMY;  Surgeon: Benjamine Sprague, DO;  Location: [facility];  Service: General;  Laterality: N/A;   EXTERNAL EAR SURGERY     EYE SURGERY     FINGER SURGERY Right    lateration to 5 th digit   KNEE SURGERY     left eye     RECTAL EXAM UNDER ANESTHESIA N/A 04/23/2022   Procedure: RECTAL EXAM UNDER ANESTHESIA WITH LIGATION OF BLEEDING;  Surgeon: Olean Ree, MD;  Location: [facility];  Service: General;  Laterality: N/A;   Patient Active Problem List   Diagnosis Date Noted   Gastroesophageal reflux disease without esophagitis 05/17/2022   Dysthymia 05/17/2022   History of syncope 05/17/2022   History of hemorrhoidectomy 05/17/2022   Vitamin D deficiency 05/17/2022   Lower extremity edema 05/17/2022   History of rectal bleeding 05/17/2022   Bleeding internal hemorrhoids    Morbid obesity ([place])    Persistent atrial fibrillation ([place]) 07/14/2020   Hypertension    Hemiparesis of right dominant side as late effect of cerebral infarction ([place]) 08/13/2019   History of kidney stones 05/02/2018   ED (erectile dysfunction) 05/02/2018   Hyperglycemia 01/30/2017   Arthritis of knee, degenerative 01/30/2017   Benign prostatic hyperplasia without lower urinary tract symptoms 02/03/2016   Dyslipidemia 08/04/2015  Diverticulitis of large intestine without perforation or abscess without bleeding 06/30/2015   OSA on CPAP 06/30/2015    REFERRING DIAG: M25.511,M25.512 (ICD-10-CM) - Bilateral shoulder pain, unspecified chronicity M17.2 (ICD-10-CM) - Post-traumatic osteoarthritis of both knees  R26.81 (ICD-10-CM) - Gait instability    THERAPY DIAG:  Difficulty in walking, not elsewhere classified  Muscle weakness (generalized)  Unsteadiness on feet  Abnormality of gait and mobility  Unspecified lack of coordination  PERTINENT HISTORY: See evaluation  PRECAUTIONS: Fall   10/2:  6MWT: 38 feet x  16 laps plus 12 feet (620 feet).  HR: 65 bpm/ O2 sat. 95%.    SUBJECTIVE:  10/10/22:  Pt. Reports 7/10 pain in L knee and 6/10 in R LE/lower legs.  Pt. Reports no new complaints.  Pt. States L knee "heaviness" is moving out of L knee into L lower leg.      PAIN:  Are you having pain? Yes: NPRS scale: 6-7/10 Pain location: B knee Pain description: aching Aggravating factors: prolonged standing/ walking Relieving factors: rest   TODAY'S TREATMENT:   10/10/22:  Nustep L5 10 min. B UE/LE.  Seat position #10/ arm position #10.  0.56 miles.   Nautilus: 60# standing lat. Pull downs with handles/ 40# standing tricep extension 15x2 each.  Seated rest break after UE there.ex.    Walking in //-bars with no UE assist (5 laps)- forward and backwards (cuing to relax R UE and increase arm swing/ upright posture).  Pharmacist, hospital.    Seated 4# ankle wts.: LAQ/marching/heel and toe raises 20x.  Standing 6" step touches with cuing to correct posture (L/R recip. Touches)- 20x.    Walking in gym with focus on step pattern/ heel strike while carrying SPC.    Agility ladder: recip. Step pattern with visual guidance/ heel strike.  No SPC assist and verbal cuing for proper technique with proprioceptive tasks.    Discussed HEP/ daily walking and activity.   See updated goals.      PATIENT EDUCATION: Education details: HEP/ daily activity Person educated: Patient Education method: Customer service manager Education comprehension: verbalized understanding and returned demonstration   HOME EXERCISE PROGRAM: Handouts given for shoulder ROM/ strengthening/ standing hip ex.    PT Short Term Goals -  11/07/22      PT SHORT TERM GOAL #1   Title Pt able to complete 6MWT and ambulate >800 feet with no assistive device safely.     Baseline 10/2:  620 feet.  HR: 65 bpm/ O2 sat. 95%.   Time 4    Period Weeks    Status On-going              PT Long Term Goals -  12/05/2022      PT LONG TERM  GOAL #1   Title Pt will improve FOTO score to 50 to display improvement in functional mobility.    Baseline 6/7: 33.  10/23: 52   Time 8   Period Weeks    Status Goal met on 10/23   Target Date 12/05/2022     PT LONG TERM GOAL #2   Title Pt wil improve TUG score to < 12 sec with no AD to display significant imporvement in reduced risk of falls    Baseline 11/23: 26 sec (did not sit down immediately leading to higher time than he would've scored) 1/19: 14.44 sec.   6/7: 12.66 sec.    Time 8   Period Weeks    Status Partially Met  Target Date 12/05/2022     PT LONG TERM GOAL #3   Title Pt will improve DGI to >19/24 to indicate decreased risk of falls with household and community walking tasks    Baseline 11/23: 11/24 1/19:12/24    Time 8    Period Weeks    Status Partially Met    Target Date 12/05/2022     PT LONG TERM GOAL #4   Title Pt. Will increase R hip flexion/ LE muscle strength 1/2 muscle grade to improve standing tolerance/ walking endurance.    Baseline See above   Time 8    Period Weeks    Status Partially Met    Target Date 12/05/2022           Plan -     Clinical Impression Statement Pt. Works hard during tx. Session and requires 2 seated rest breaks after there.ex. secondary to fatigue/ SOB.  No LOB during tx. And pt. Does benefit from assistive device due to fatigue/ LE swelling and weakness.  Pt. Presents with less overall knee/lower leg pain but remains swollen/ pitting edema.  Pt. Demonstrates recip. Gait pattern in clinic without SPC but slow, antalgic gait while walking outside.  Pt will continue to benefit from skilled PT services to progress strength and mobility, along with cardiovascular endurance.    Personal Factors and Comorbidities Comorbidity 2    Comorbidities Hypertension, Obesity    Examination-Activity Limitations Bed Mobility;Reach Overhead;Squat;Lift;Dressing;Hygiene/Grooming    Examination-Participation Restrictions Community Activity;Yard Work     Stability/Clinical Decision Making Evolving/Moderate complexity    Clinical Decision Making Moderate    Rehab Potential Fair    PT Frequency 1x / week    PT Duration 8 weeks    PT Treatment/Interventions ADLs/Self Care Home Management;Gait training;Stair training;Functional mobility training;Therapeutic activities;Therapeutic exercise;Balance training;Neuromuscular re-education;Manual techniques;Patient/family education;Electrical Stimulation;Moist Heat;Cryotherapy    PT Next Visit Plan Progress LE strength/ endurance/ pain mgmt.      PT Home Exercise Plan Shoulder pulleys in flex, scaption, abd, pendelums, scap retractions with resistance (RTB), D2 flexion pattern in sitting no resistance.    Consulted and Agree with Plan of Care Patient          [name] C Sherk, PT, DPT # [id number] 10/11/2022, 7:35 PM 

## 2022-10-17 ENCOUNTER — Ambulatory Visit: Payer: Medicare Other | Admitting: Physical Therapy

## 2022-10-17 ENCOUNTER — Encounter: Payer: Self-pay | Admitting: Physical Therapy

## 2022-10-17 DIAGNOSIS — M6281 Muscle weakness (generalized): Secondary | ICD-10-CM

## 2022-10-17 DIAGNOSIS — R269 Unspecified abnormalities of gait and mobility: Secondary | ICD-10-CM

## 2022-10-17 DIAGNOSIS — R2681 Unsteadiness on feet: Secondary | ICD-10-CM | POA: Diagnosis not present

## 2022-10-17 DIAGNOSIS — R262 Difficulty in walking, not elsewhere classified: Secondary | ICD-10-CM | POA: Diagnosis not present

## 2022-10-17 DIAGNOSIS — M25611 Stiffness of right shoulder, not elsewhere classified: Secondary | ICD-10-CM

## 2022-10-17 DIAGNOSIS — R279 Unspecified lack of coordination: Secondary | ICD-10-CM

## 2022-10-17 DIAGNOSIS — R2689 Other abnormalities of gait and mobility: Secondary | ICD-10-CM | POA: Diagnosis not present

## 2022-10-17 NOTE — Therapy (Signed)
OUTPATIENT PHYSICAL THERAPY TREATMENT   Patient Name: Tom Underwood MRN: [id number] DOB:1951-03-28, 71 y.o., male Today's Date: 10/17/2022  PCP: Steele Sizer, MD REFERRING PROVIDER: Myles Gip, DO   PT End of Session - 10/17/22 1258     Visit Number 23    Number of Visits 30    Date for PT Re-Evaluation 12/05/22    PT Start Time 2330    PT Stop Time 1347    PT Time Calculation (min) 49 min    Activity Tolerance Patient tolerated treatment well;Patient limited by fatigue    Behavior During Therapy [facility] for tasks assessed/performed              Past Medical History:  Diagnosis Date   Anxiety    Arthritis    Atrial fibrillation ([place])    a.) CHA2DS2-VASc = 4 (age, HTN, CVA x2). b.) s/p 200 J synchronized cardioversion (DCCV) on 12/08/2021. c.) rate/rythum maintained on oral metoprolol tartrate; chronically anticoagulated using rivaroxaban.   BPH (benign prostatic hyperplasia)    Complication of anesthesia    a.)  Intolerant of propofol; causes "skin burning and involuntary muscle twitching"   DDD (degenerative disc disease), lumbar    Decreased libido    Depression    Diverticulitis    Diverticulosis    GERD (gastroesophageal reflux disease)    Hepatic steatosis    History of 2019 novel coronavirus disease (COVID-19) 12/28/2020   History of cardiac murmur in childhood    History of kidney stones    HLD (hyperlipidemia)    HLD (hyperlipidemia)    Hypertension    Hypogonadism in male    Long term current use of anticoagulant    a.) rivaroxaban   OSA on CPAP    Pre-diabetes    Rhinitis, allergic    Stroke ([place]) 07/23/2019   a.) LEFT posterior cor radiata. b.) residual RIGHT sided weakness   Past Surgical History:  Procedure Laterality Date   CARDIOVERSION N/A 12/08/2021   Procedure: CARDIOVERSION;  Surgeon: Nelva Bush, MD;  Location: [facility] ORS;  Service: Cardiovascular;  Laterality: N/A;   COLONOSCOPY     EVALUATION UNDER ANESTHESIA WITH  HEMORRHOIDECTOMY N/A 04/15/2022   Procedure: EXAM UNDER ANESTHESIA WITH HEMORRHOIDECTOMY;  Surgeon: Benjamine Sprague, DO;  Location: [facility] ORS;  Service: General;  Laterality: N/A;   EXTERNAL EAR SURGERY     EYE SURGERY     FINGER SURGERY Right    lateration to 5 th digit   KNEE SURGERY     left eye     RECTAL EXAM UNDER ANESTHESIA N/A 04/23/2022   Procedure: RECTAL EXAM UNDER ANESTHESIA WITH LIGATION OF BLEEDING;  Surgeon: Olean Ree, MD;  Location: [facility] ORS;  Service: General;  Laterality: N/A;   Patient Active Problem List   Diagnosis Date Noted   Gastroesophageal reflux disease without esophagitis 05/17/2022   Dysthymia 05/17/2022   History of syncope 05/17/2022   History of hemorrhoidectomy 05/17/2022   Vitamin D deficiency 05/17/2022   Lower extremity edema 05/17/2022   History of rectal bleeding 05/17/2022   Bleeding internal hemorrhoids    Morbid obesity ([place])    Persistent atrial fibrillation ([place]) 07/14/2020   Hypertension    Hemiparesis of right dominant side as late effect of cerebral infarction ([place]) 08/13/2019   History of kidney stones 05/02/2018   ED (erectile dysfunction) 05/02/2018   Hyperglycemia 01/30/2017   Arthritis of knee, degenerative 01/30/2017   Benign prostatic hyperplasia without lower urinary tract symptoms 02/03/2016   Dyslipidemia 08/04/2015  Diverticulitis of large intestine without perforation or abscess without bleeding 06/30/2015   OSA on CPAP 06/30/2015    REFERRING DIAG: M25.511,M25.512 (ICD-10-CM) - Bilateral shoulder pain, unspecified chronicity M17.2 (ICD-10-CM) - Post-traumatic osteoarthritis of both knees  R26.81 (ICD-10-CM) - Gait instability    THERAPY DIAG:  Difficulty in walking, not elsewhere classified  Muscle weakness (generalized)  Unsteadiness on feet  Abnormality of gait and mobility  Unspecified lack of coordination  Other abnormalities of gait and mobility  Decreased range of motion of right shoulder  PERTINENT  HISTORY: See evaluation  PRECAUTIONS: Fall   10/2:  6MWT: 38 feet x 16 laps plus 12 feet (620 feet).  HR: 65 bpm/ O2 sat. 95%.   SUBJECTIVE:  10/17/22:  Pt. Reports 8-9/10 pain in L knee and 7/10 in R LE/lower legs.  Pt. Reports no new complaints.  Pt. States L knee "heaviness" is moving out of L knee into L lower leg.  Moderate B lower leg swelling present.     PAIN:  Are you having pain? Yes: NPRS scale: 7-9/10 Pain location: B knee Pain description: aching Aggravating factors: prolonged standing/ walking Relieving factors: rest   TODAY'S TREATMENT:   10/17/22:  STS from gray chair with no UE assist (good technique)- 8x.  Extra time.     Nustep L5 10 min. B UE/LE.  Seat position #10/ arm position #10.  0.59 miles. BP 134/72.  HR: 62 bpm  Walking in //-bars with 6" hurdles working on recip. Step pattern/ hip flexion.  Lateral walking L/R in //-bar with no UE assist (cuing to increase step length).     Nautilus: 60# standing lat. Pull downs with handles/ 40# standing tricep extension 15x2 each.  Seated rest break after UE there.ex.       Walking in gym with focus on step pattern/ heel strike while carrying SPC.    Agility ladder: recip. Step pattern with visual guidance/ heel strike.  No SPC assist and verbal cuing for proper technique with proprioceptive tasks.    Discussed HEP/ daily walking and activity.        PATIENT EDUCATION: Education details: HEP/ daily activity Person educated: Patient Education method: Customer service manager Education comprehension: verbalized understanding and returned demonstration   HOME EXERCISE PROGRAM: Handouts given for shoulder ROM/ strengthening/ standing hip ex.    PT Short Term Goals -  11/07/22      PT SHORT TERM GOAL #1   Title Pt able to complete 6MWT and ambulate >800 feet with no assistive device safely.     Baseline 10/2:  620 feet.  HR: 65 bpm/ O2 sat. 95%.   Time 4    Period Weeks    Status On-going               PT Long Term Goals -  12/05/2022      PT LONG TERM GOAL #1   Title Pt will improve FOTO score to 50 to display improvement in functional mobility.    Baseline 6/7: 33.  10/23: 52   Time 8   Period Weeks    Status Goal met on 10/23   Target Date 12/05/2022     PT LONG TERM GOAL #2   Title Pt wil improve TUG score to < 12 sec with no AD to display significant imporvement in reduced risk of falls    Baseline 11/23: 26 sec (did not sit down immediately leading to higher time than he would've scored) 1/19: 14.44 sec.   6/7:  12.66 sec.    Time 8   Period Weeks    Status Partially Met    Target Date 12/05/2022     PT LONG TERM GOAL #3   Title Pt will improve DGI to >19/24 to indicate decreased risk of falls with household and community walking tasks    Baseline 11/23: 11/24 1/19:12/24    Time 8    Period Weeks    Status Partially Met    Target Date 12/05/2022     PT LONG TERM GOAL #4   Title Pt. Will increase R hip flexion/ LE muscle strength 1/2 muscle grade to improve standing tolerance/ walking endurance.    Baseline See above   Time 8    Period Weeks    Status Partially Met    Target Date 12/05/2022           Plan -     Clinical Impression Statement Pt. Works hard during tx. Session and requires several short seated rest breaks after there.ex. secondary to fatigue/ SOB.  Marked fatigue with repeated sit to stands without use of UE assist.   No LOB during tx. And pt. Does benefit from assistive device due to fatigue/ LE swelling and weakness.  Pt. Presents with less overall knee/lower leg pain but remains swollen/ pitting edema.  Pt. Demonstrates recip. Gait pattern in clinic without SPC but slow, antalgic gait while walking outside.  Pt will continue to benefit from skilled PT services to progress strength and mobility, along with cardiovascular endurance.    Personal Factors and Comorbidities Comorbidity 2    Comorbidities Hypertension, Obesity    Examination-Activity  Limitations Bed Mobility;Reach Overhead;Squat;Lift;Dressing;Hygiene/Grooming    Examination-Participation Restrictions Community Activity;Yard Work    [employer]chant [employer] officer Evolving/Moderate complexity    Clinical Decision Making Moderate    Rehab Potential Fair    PT Frequency 1x / week    PT Duration 8 weeks    PT Treatment/Interventions ADLs/Self Care Home Management;Gait training;Stair training;Functional mobility training;Therapeutic activities;Therapeutic exercise;Balance training;Neuromuscular re-education;Manual techniques;Patient/family education;Electrical Stimulation;Moist Heat;Cryotherapy    PT Next Visit Plan Progress LE strength/ endurance/ pain mgmt.      PT Home Exercise Plan Shoulder pulleys in flex, scaption, abd, pendelums, scap retractions with resistance (RTB), D2 flexion pattern in sitting no resistance.    Consulted and Agree with Plan of Care Patient          Pura Spice, PT, DPT # [phone] 10/18/2022, 5:45 PM

## 2022-10-21 ENCOUNTER — Other Ambulatory Visit: Payer: Self-pay

## 2022-10-21 MED ORDER — RIVAROXABAN 20 MG PO TABS: 20.0000 mg | ORAL_TABLET | Freq: Every day | ORAL | 5 refills | Status: DC

## 2022-10-21 NOTE — Telephone Encounter (Signed)
Prescription refill request for Xarelto received.  Indication:afib Last office visit:9/23 Weight:136.1 kg Age:71 Scr:1.1 CrCl:122.01  ml/min  Prescription refilled

## 2022-10-24 ENCOUNTER — Ambulatory Visit: Payer: Medicare Other | Admitting: Physical Therapy

## 2022-10-26 ENCOUNTER — Ambulatory Visit: Payer: Medicare Other | Admitting: Physical Therapy

## 2022-10-26 DIAGNOSIS — R2681 Unsteadiness on feet: Secondary | ICD-10-CM

## 2022-10-26 DIAGNOSIS — R269 Unspecified abnormalities of gait and mobility: Secondary | ICD-10-CM | POA: Diagnosis not present

## 2022-10-26 DIAGNOSIS — R262 Difficulty in walking, not elsewhere classified: Secondary | ICD-10-CM

## 2022-10-26 DIAGNOSIS — R279 Unspecified lack of coordination: Secondary | ICD-10-CM

## 2022-10-26 DIAGNOSIS — M6281 Muscle weakness (generalized): Secondary | ICD-10-CM | POA: Diagnosis not present

## 2022-10-26 DIAGNOSIS — R2689 Other abnormalities of gait and mobility: Secondary | ICD-10-CM | POA: Diagnosis not present

## 2022-10-26 NOTE — Therapy (Signed)
OUTPATIENT PHYSICAL THERAPY TREATMENT   Patient Name: Tom Underwood MRN: [id number] DOB:October 11, 1951, 71 y.o., male Today's Date: 10/26/2022  PCP: Steele Sizer, MD REFERRING PROVIDER: Myles Gip, DO   PT End of Session - 10/26/22 0939     Visit Number 24    Number of Visits 30    Date for PT Re-Evaluation 12/05/22    PT Start Time 0939    PT Stop Time 1035    PT Time Calculation (min) 56 min    Activity Tolerance Patient tolerated treatment well;Patient limited by fatigue    Behavior During Therapy [facility] for tasks assessed/performed              Past Medical History:  Diagnosis Date   Anxiety    Arthritis    Atrial fibrillation ([place])    a.) CHA2DS2-VASc = 4 (age, HTN, CVA x2). b.) s/p 200 J synchronized cardioversion (DCCV) on 12/08/2021. c.) rate/rythum maintained on oral metoprolol tartrate; chronically anticoagulated using rivaroxaban.   BPH (benign prostatic hyperplasia)    Complication of anesthesia    a.)  Intolerant of propofol; causes "skin burning and involuntary muscle twitching"   DDD (degenerative disc disease), lumbar    Decreased libido    Depression    Diverticulitis    Diverticulosis    GERD (gastroesophageal reflux disease)    Hepatic steatosis    History of 2019 novel coronavirus disease (COVID-19) 12/28/2020   History of cardiac murmur in childhood    History of kidney stones    HLD (hyperlipidemia)    HLD (hyperlipidemia)    Hypertension    Hypogonadism in male    Long term current use of anticoagulant    a.) rivaroxaban   OSA on CPAP    Pre-diabetes    Rhinitis, allergic    Stroke ([place]) 07/23/2019   a.) LEFT posterior cor radiata. b.) residual RIGHT sided weakness   Past Surgical History:  Procedure Laterality Date   CARDIOVERSION N/A 12/08/2021   Procedure: CARDIOVERSION;  Surgeon: Nelva Bush, MD;  Location: [facility] ORS;  Service: Cardiovascular;  Laterality: N/A;   COLONOSCOPY     EVALUATION UNDER ANESTHESIA WITH  HEMORRHOIDECTOMY N/A 04/15/2022   Procedure: EXAM UNDER ANESTHESIA WITH HEMORRHOIDECTOMY;  Surgeon: Benjamine Sprague, DO;  Location: [facility] ORS;  Service: General;  Laterality: N/A;   EXTERNAL EAR SURGERY     EYE SURGERY     FINGER SURGERY Right    lateration to 5 th digit   KNEE SURGERY     left eye     RECTAL EXAM UNDER ANESTHESIA N/A 04/23/2022   Procedure: RECTAL EXAM UNDER ANESTHESIA WITH LIGATION OF BLEEDING;  Surgeon: Olean Ree, MD;  Location: [facility] ORS;  Service: General;  Laterality: N/A;   Patient Active Problem List   Diagnosis Date Noted   Gastroesophageal reflux disease without esophagitis 05/17/2022   Dysthymia 05/17/2022   History of syncope 05/17/2022   History of hemorrhoidectomy 05/17/2022   Vitamin D deficiency 05/17/2022   Lower extremity edema 05/17/2022   History of rectal bleeding 05/17/2022   Bleeding internal hemorrhoids    Morbid obesity ([place])    Persistent atrial fibrillation ([place]) 07/14/2020   Hypertension    Hemiparesis of right dominant side as late effect of cerebral infarction ([place]) 08/13/2019   History of kidney stones 05/02/2018   ED (erectile dysfunction) 05/02/2018   Hyperglycemia 01/30/2017   Arthritis of knee, degenerative 01/30/2017   Benign prostatic hyperplasia without lower urinary tract symptoms 02/03/2016   Dyslipidemia 08/04/2015  Diverticulitis of large intestine without perforation or abscess without bleeding 06/30/2015   OSA on CPAP 06/30/2015    REFERRING DIAG: M25.511,M25.512 (ICD-10-CM) - Bilateral shoulder pain, unspecified chronicity M17.2 (ICD-10-CM) - Post-traumatic osteoarthritis of both knees  R26.81 (ICD-10-CM) - Gait instability    THERAPY DIAG:  Difficulty in walking, not elsewhere classified  Muscle weakness (generalized)  Unsteadiness on feet  Abnormality of gait and mobility  Unspecified lack of coordination  Other abnormalities of gait and mobility  PERTINENT HISTORY: See evaluation  PRECAUTIONS:  Fall   10/2:  6MWT: 38 feet x 16 laps plus 12 feet (620 feet).  HR: 65 bpm/ O2 sat. 95%.   SUBJECTIVE:  10/26/22:  Pt. Reports 8-9/10 pain in B knee/lower legs.  Pt. Reports no new complaints.  Pt. Reports death of step daughter last week and pt. Has been in mourning.    PAIN:  Are you having pain? Yes: NPRS scale: 8-9/10 Pain location: B knee Pain description: aching Aggravating factors: prolonged standing/ walking Relieving factors: rest   TODAY'S TREATMENT:   10/26/22:    Nustep L5 10 min. B UE/LE.  Seat position #10/ arm position #10.  0.55 miles.  HR: 79 bpm.  O2 sat: 94%.  Walking in //-bars with alt. UE/LE touches. Recip. step pattern/ hip flexion.  Lateral walking L/R in //-bar with no UE assist (cuing to increase step length).  3 laps each.  Nautilus: 60# seated lat. Pull downs with handles/ 40# standing tricep extension 20xeach.  Seated rest break after UE there.ex.  Discussed upcoming weekend/ activity.      Walking in gym with focus on step pattern/ heel strike while carrying SPC.    No change to HEP       PATIENT EDUCATION: Education details: HEP/ daily activity Person educated: Patient Education method: Customer service manager Education comprehension: verbalized understanding and returned demonstration   HOME EXERCISE PROGRAM: Handouts given for shoulder ROM/ strengthening/ standing hip ex.    PT Short Term Goals -  11/07/22      PT SHORT TERM GOAL #1   Title Pt able to complete 6MWT and ambulate >800 feet with no assistive device safely.     Baseline 10/2:  620 feet.  HR: 65 bpm/ O2 sat. 95%.   Time 4    Period Weeks    Status On-going              PT Long Term Goals -  12/05/2022      PT LONG TERM GOAL #1   Title Pt will improve FOTO score to 50 to display improvement in functional mobility.    Baseline 6/7: 33.  10/23: 52   Time 8   Period Weeks    Status Goal met on 10/23   Target Date 12/05/2022     PT LONG TERM GOAL #2    Title Pt wil improve TUG score to < 12 sec with no AD to display significant imporvement in reduced risk of falls    Baseline 11/23: 26 sec (did not sit down immediately leading to higher time than he would've scored) 1/19: 14.44 sec.   6/7: 12.66 sec.    Time 8   Period Weeks    Status Partially Met    Target Date 12/05/2022     PT LONG TERM GOAL #3   Title Pt will improve DGI to >19/24 to indicate decreased risk of falls with household and community walking tasks    Baseline 11/23: 11/24 1/19:12/24  Time 8    Period Weeks    Status Partially Met    Target Date 12/05/2022     PT LONG TERM GOAL #4   Title Pt. Will increase R hip flexion/ LE muscle strength 1/2 muscle grade to improve standing tolerance/ walking endurance.    Baseline See above   Time 8    Period Weeks    Status Partially Met    Target Date 12/05/2022           Plan -     Clinical Impression Statement Pt. Works hard during tx. Session and requires several short seated rest breaks after there.ex. secondary to fatigue/ SOB.  Marked fatigue noted as tx. Progresses with walking in hallway.  Pt. C/o persistent B knee/ lower leg discomfort and presents with significant pitting edema.  No LOB during tx. And pt. Does benefit from assistive device due to fatigue/ LE swelling and weakness.   Pt. Demonstrates recip. Gait pattern in clinic without SPC but slow, antalgic gait while walking outside.  Pt will continue to benefit from skilled PT services to progress strength and mobility, along with cardiovascular endurance.    Personal Factors and Comorbidities Comorbidity 2    Comorbidities Hypertension, Obesity    Examination-Activity Limitations Bed Mobility;Reach Overhead;Squat;Lift;Dressing;Hygiene/Grooming    Examination-Participation Restrictions Community Activity;Yard Work    [employer]chant [employer] officer Evolving/Moderate complexity    Clinical Decision Making Moderate    Rehab Potential Fair    PT Frequency 1x /  week    PT Duration 8 weeks    PT Treatment/Interventions ADLs/Self Care Home Management;Gait training;Stair training;Functional mobility training;Therapeutic activities;Therapeutic exercise;Balance training;Neuromuscular re-education;Manual techniques;Patient/family education;Electrical Stimulation;Moist Heat;Cryotherapy    PT Next Visit Plan Progress LE strength/ endurance/ pain mgmt.      PT Home Exercise Plan Shoulder pulleys in flex, scaption, abd, pendelums, scap retractions with resistance (RTB), D2 flexion pattern in sitting no resistance.    Consulted and Agree with Plan of Care Patient          Pura Spice, PT, DPT # [phone] 10/26/2022, 10:52 AM

## 2022-10-31 ENCOUNTER — Encounter: Payer: Self-pay | Admitting: Physical Therapy

## 2022-10-31 ENCOUNTER — Ambulatory Visit: Payer: Medicare Other | Admitting: Physical Therapy

## 2022-10-31 DIAGNOSIS — R269 Unspecified abnormalities of gait and mobility: Secondary | ICD-10-CM | POA: Diagnosis not present

## 2022-10-31 DIAGNOSIS — R2681 Unsteadiness on feet: Secondary | ICD-10-CM | POA: Diagnosis not present

## 2022-10-31 DIAGNOSIS — R2689 Other abnormalities of gait and mobility: Secondary | ICD-10-CM | POA: Diagnosis not present

## 2022-10-31 DIAGNOSIS — M6281 Muscle weakness (generalized): Secondary | ICD-10-CM | POA: Diagnosis not present

## 2022-10-31 DIAGNOSIS — R262 Difficulty in walking, not elsewhere classified: Secondary | ICD-10-CM | POA: Diagnosis not present

## 2022-10-31 DIAGNOSIS — R279 Unspecified lack of coordination: Secondary | ICD-10-CM | POA: Diagnosis not present

## 2022-10-31 NOTE — Therapy (Signed)
OUTPATIENT PHYSICAL THERAPY TREATMENT   Patient Name: Tom Underwood MRN: [id number] DOB:07-08-1951, 71 y.o., male Today's Date: 10/31/2022  PCP: Steele Sizer, MD REFERRING PROVIDER: Myles Gip, DO   PT End of Session - 10/31/22 1254     Visit Number 25    Number of Visits 30    Date for PT Re-Evaluation 12/05/22    PT Start Time 1254    PT Stop Time 1349    PT Time Calculation (min) 55 min    Activity Tolerance Patient tolerated treatment well;Patient limited by fatigue    Behavior During Therapy WFL for tasks assessed/performed              Past Medical History:  Diagnosis Date   Anxiety    Arthritis    Atrial fibrillation ([place])    a.) CHA2DS2-VASc = 4 (age, HTN, CVA x2). b.) s/p 200 J synchronized cardioversion (DCCV) on 12/08/2021. c.) rate/rythum maintained on oral metoprolol tartrate; chronically anticoagulated using rivaroxaban.   BPH (benign prostatic hyperplasia)    Complication of anesthesia    a.)  Intolerant of propofol; causes "skin burning and involuntary muscle twitching"   DDD (degenerative disc disease), lumbar    Decreased libido    Depression    Diverticulitis    Diverticulosis    GERD (gastroesophageal reflux disease)    Hepatic steatosis    History of 2019 novel coronavirus disease (COVID-19) 12/28/2020   History of cardiac murmur in childhood    History of kidney stones    HLD (hyperlipidemia)    HLD (hyperlipidemia)    Hypertension    Hypogonadism in male    Long term current use of anticoagulant    a.) rivaroxaban   OSA on CPAP    Pre-diabetes    Rhinitis, allergic    Stroke ([place]) 07/23/2019   a.) LEFT posterior cor radiata. b.) residual RIGHT sided weakness   Past Surgical History:  Procedure Laterality Date   CARDIOVERSION N/A 12/08/2021   Procedure: CARDIOVERSION;  Surgeon: Nelva Bush, MD;  Location: [facility];  Service: Cardiovascular;  Laterality: N/A;   COLONOSCOPY     EVALUATION UNDER ANESTHESIA WITH  HEMORRHOIDECTOMY N/A 04/15/2022   Procedure: EXAM UNDER ANESTHESIA WITH HEMORRHOIDECTOMY;  Surgeon: Benjamine Sprague, DO;  Location: [facility];  Service: General;  Laterality: N/A;   EXTERNAL EAR SURGERY     EYE SURGERY     FINGER SURGERY Right    lateration to 5 th digit   KNEE SURGERY     left eye     RECTAL EXAM UNDER ANESTHESIA N/A 04/23/2022   Procedure: RECTAL EXAM UNDER ANESTHESIA WITH LIGATION OF BLEEDING;  Surgeon: Olean Ree, MD;  Location: [facility];  Service: General;  Laterality: N/A;   Patient Active Problem List   Diagnosis Date Noted   Gastroesophageal reflux disease without esophagitis 05/17/2022   Dysthymia 05/17/2022   History of syncope 05/17/2022   History of hemorrhoidectomy 05/17/2022   Vitamin D deficiency 05/17/2022   Lower extremity edema 05/17/2022   History of rectal bleeding 05/17/2022   Bleeding internal hemorrhoids    Morbid obesity ([place])    Persistent atrial fibrillation ([place]) 07/14/2020   Hypertension    Hemiparesis of right dominant side as late effect of cerebral infarction ([place]) 08/13/2019   History of kidney stones 05/02/2018   ED (erectile dysfunction) 05/02/2018   Hyperglycemia 01/30/2017   Arthritis of knee, degenerative 01/30/2017   Benign prostatic hyperplasia without lower urinary tract symptoms 02/03/2016   Dyslipidemia 08/04/2015  Diverticulitis of large intestine without perforation or abscess without bleeding 06/30/2015   OSA on CPAP 06/30/2015    REFERRING DIAG: M25.511,M25.512 (ICD-10-CM) - Bilateral shoulder pain, unspecified chronicity M17.2 (ICD-10-CM) - Post-traumatic osteoarthritis of both knees  R26.81 (ICD-10-CM) - Gait instability    THERAPY DIAG:  Difficulty in walking, not elsewhere classified  Muscle weakness (generalized)  Unsteadiness on feet  Abnormality of gait and mobility  Unspecified lack of coordination  Other abnormalities of gait and mobility  PERTINENT HISTORY: See evaluation  PRECAUTIONS:  Fall   10/2:  6MWT: 38 feet x 16 laps plus 12 feet (620 feet).  HR: 65 bpm/ O2 sat. 95%.   SUBJECTIVE:  10/31/22:  Pt. Reports 7-8/10 pain in B knee/lower legs (L worse than R).  Pt. Reports no new complaints.    PAIN:  Are you having pain? Yes: NPRS scale: 7-8/10 Pain location: B knee Pain description: aching Aggravating factors: prolonged standing/ walking Relieving factors: rest   TODAY'S TREATMENT:   10/31/22:    Nustep L5 10 min. B UE/LE.  Seat position #10/ arm position #10.  0.55 miles.  HR: 86 bpm.  O2 sat: 95%.  Standing 12" step touches with light to no handrail assist 10x L/R  Walking in //-bars with alt. UE/LE touches. Recip. step pattern/ hip flexion.  Lateral walking L/R in //-bar with no UE assist (cuing to increase step length).  4 laps each.  Seated rest break required.    Nautilus: 60# seated lat. Pull downs with wand 20x.  60# standing tricep extension 5x.      Walking in gym with focus on step pattern/ heel strike while carrying SPC.    No change to HEP       PATIENT EDUCATION: Education details: HEP/ daily activity Person educated: Patient Education method: Customer service manager Education comprehension: verbalized understanding and returned demonstration   HOME EXERCISE PROGRAM: Handouts given for shoulder ROM/ strengthening/ standing hip ex.    PT Short Term Goals -  11/07/22      PT SHORT TERM GOAL #1   Title Pt able to complete 6MWT and ambulate >800 feet with no assistive device safely.     Baseline 10/2:  620 feet.  HR: 65 bpm/ O2 sat. 95%.   Time 4    Period Weeks    Status On-going              PT Long Term Goals -  12/05/2022      PT LONG TERM GOAL #1   Title Pt will improve FOTO score to 50 to display improvement in functional mobility.    Baseline 6/7: 33.  10/23: 52   Time 8   Period Weeks    Status Goal met on 10/23   Target Date 12/05/2022     PT LONG TERM GOAL #2   Title Pt wil improve TUG score to < 12 sec  with no AD to display significant imporvement in reduced risk of falls    Baseline 11/23: 26 sec (did not sit down immediately leading to higher time than he would've scored) 1/19: 14.44 sec.   6/7: 12.66 sec.    Time 8   Period Weeks    Status Partially Met    Target Date 12/05/2022     PT LONG TERM GOAL #3   Title Pt will improve DGI to >19/24 to indicate decreased risk of falls with household and community walking tasks    Baseline 11/23: 11/24 1/19:12/24  Time 8    Period Weeks    Status Partially Met    Target Date 12/05/2022     PT LONG TERM GOAL #4   Title Pt. Will increase R hip flexion/ LE muscle strength 1/2 muscle grade to improve standing tolerance/ walking endurance.    Baseline See above   Time 8    Period Weeks    Status Partially Met    Target Date 12/05/2022           Plan -     Clinical Impression Statement Pt. Works hard during tx. Session and requires several short seated rest breaks after there.ex. secondary to fatigue/ SOB.  Marked fatigue noted as tx. Progresses with walking in hallway.  Pt. C/o persistent B knee/ lower leg discomfort and presents with significant pitting edema.  No LOB during tx. And pt. Does benefit from assistive device due to fatigue/ LE swelling and weakness.   Pt. Demonstrates recip. Gait pattern in clinic without SPC but slow, antalgic gait while walking outside.  Pt will continue to benefit from skilled PT services to progress strength and mobility, along with cardiovascular endurance.    Personal Factors and Comorbidities Comorbidity 2    Comorbidities Hypertension, Obesity    Examination-Activity Limitations Bed Mobility;Reach Overhead;Squat;Lift;Dressing;Hygiene/Grooming    Examination-Participation Restrictions Community Activity;Yard Work    [employer]chant [employer] officer Evolving/Moderate complexity    Clinical Decision Making Moderate    Rehab Potential Fair    PT Frequency 1x / week    PT Duration 8 weeks    PT  Treatment/Interventions ADLs/Self Care Home Management;Gait training;Stair training;Functional mobility training;Therapeutic activities;Therapeutic exercise;Balance training;Neuromuscular re-education;Manual techniques;Patient/family education;Electrical Stimulation;Moist Heat;Cryotherapy    PT Next Visit Plan Progress LE strength/ endurance/ pain mgmt.      PT Home Exercise Plan Shoulder pulleys in flex, scaption, abd, pendelums, scap retractions with resistance (RTB), D2 flexion pattern in sitting no resistance.    Consulted and Agree with Plan of Care Patient          Pura Spice, PT, DPT # [phone] 10/31/2022, 7:00 PM

## 2022-11-07 ENCOUNTER — Ambulatory Visit: Payer: Medicare Other | Attending: Family Medicine | Admitting: Physical Therapy

## 2022-11-07 DIAGNOSIS — R269 Unspecified abnormalities of gait and mobility: Secondary | ICD-10-CM | POA: Insufficient documentation

## 2022-11-07 DIAGNOSIS — M6281 Muscle weakness (generalized): Secondary | ICD-10-CM | POA: Diagnosis not present

## 2022-11-07 DIAGNOSIS — R2689 Other abnormalities of gait and mobility: Secondary | ICD-10-CM | POA: Insufficient documentation

## 2022-11-07 DIAGNOSIS — R2681 Unsteadiness on feet: Secondary | ICD-10-CM | POA: Diagnosis not present

## 2022-11-07 DIAGNOSIS — R279 Unspecified lack of coordination: Secondary | ICD-10-CM | POA: Insufficient documentation

## 2022-11-07 DIAGNOSIS — R262 Difficulty in walking, not elsewhere classified: Secondary | ICD-10-CM | POA: Insufficient documentation

## 2022-11-07 DIAGNOSIS — M25611 Stiffness of right shoulder, not elsewhere classified: Secondary | ICD-10-CM | POA: Insufficient documentation

## 2022-11-07 NOTE — Therapy (Signed)
OUTPATIENT PHYSICAL THERAPY TREATMENT   Patient Name: Tom Underwood MRN: [id number] DOB:1951/05/28, 71 y.o., male Today's Date: 11/07/2022  PCP: Steele Sizer, MD REFERRING PROVIDER: Myles Gip, DO   PT End of Session - 11/07/22 1302     Visit Number 26    Number of Visits 30    Date for PT Re-Evaluation 12/05/22    PT Start Time 7106    Activity Tolerance Patient tolerated treatment well;Patient limited by fatigue    Behavior During Therapy [facility] for tasks assessed/performed            1256 to 1355  (59 minutes).      Past Medical History:  Diagnosis Date   Anxiety    Arthritis    Atrial fibrillation ([place])    a.) CHA2DS2-VASc = 4 (age, HTN, CVA x2). b.) s/p 200 J synchronized cardioversion (DCCV) on 12/08/2021. c.) rate/rythum maintained on oral metoprolol tartrate; chronically anticoagulated using rivaroxaban.   BPH (benign prostatic hyperplasia)    Complication of anesthesia    a.)  Intolerant of propofol; causes "skin burning and involuntary muscle twitching"   DDD (degenerative disc disease), lumbar    Decreased libido    Depression    Diverticulitis    Diverticulosis    GERD (gastroesophageal reflux disease)    Hepatic steatosis    History of 2019 novel coronavirus disease (COVID-19) 12/28/2020   History of cardiac murmur in childhood    History of kidney stones    HLD (hyperlipidemia)    HLD (hyperlipidemia)    Hypertension    Hypogonadism in male    Long term current use of anticoagulant    a.) rivaroxaban   OSA on CPAP    Pre-diabetes    Rhinitis, allergic    Stroke ([place]) 07/23/2019   a.) LEFT posterior cor radiata. b.) residual RIGHT sided weakness   Past Surgical History:  Procedure Laterality Date   CARDIOVERSION N/A 12/08/2021   Procedure: CARDIOVERSION;  Surgeon: Nelva Bush, MD;  Location: [facility];  Service: Cardiovascular;  Laterality: N/A;   COLONOSCOPY     EVALUATION UNDER ANESTHESIA WITH HEMORRHOIDECTOMY N/A 04/15/2022    Procedure: EXAM UNDER ANESTHESIA WITH HEMORRHOIDECTOMY;  Surgeon: Benjamine Sprague, DO;  Location: [facility];  Service: General;  Laterality: N/A;   EXTERNAL EAR SURGERY     EYE SURGERY     FINGER SURGERY Right    lateration to 5 th digit   KNEE SURGERY     left eye     RECTAL EXAM UNDER ANESTHESIA N/A 04/23/2022   Procedure: RECTAL EXAM UNDER ANESTHESIA WITH LIGATION OF BLEEDING;  Surgeon: Olean Ree, MD;  Location: [facility];  Service: General;  Laterality: N/A;   Patient Active Problem List   Diagnosis Date Noted   Gastroesophageal reflux disease without esophagitis 05/17/2022   Dysthymia 05/17/2022   History of syncope 05/17/2022   History of hemorrhoidectomy 05/17/2022   Vitamin D deficiency 05/17/2022   Lower extremity edema 05/17/2022   History of rectal bleeding 05/17/2022   Bleeding internal hemorrhoids    Morbid obesity ([place])    Persistent atrial fibrillation ([place]) 07/14/2020   Hypertension    Hemiparesis of right dominant side as late effect of cerebral infarction ([place]) 08/13/2019   History of kidney stones 05/02/2018   ED (erectile dysfunction) 05/02/2018   Hyperglycemia 01/30/2017   Arthritis of knee, degenerative 01/30/2017   Benign prostatic hyperplasia without lower urinary tract symptoms 02/03/2016   Dyslipidemia 08/04/2015   Diverticulitis of large intestine without perforation  or abscess without bleeding 06/30/2015   OSA on CPAP 06/30/2015    REFERRING DIAG: M25.511,M25.512 (ICD-10-CM) - Bilateral shoulder pain, unspecified chronicity M17.2 (ICD-10-CM) - Post-traumatic osteoarthritis of both knees  R26.81 (ICD-10-CM) - Gait instability    THERAPY DIAG:  Difficulty in walking, not elsewhere classified  Muscle weakness (generalized)  Unsteadiness on feet  Abnormality of gait and mobility  Unspecified lack of coordination  Other abnormalities of gait and mobility  PERTINENT HISTORY: See evaluation  PRECAUTIONS: Fall   10/2:  6MWT: 38 feet x 16  laps plus 12 feet (620 feet).  HR: 65 bpm/ O2 sat. 95%.   SUBJECTIVE:  11/07/22:  Pt. Reports 6-7/10 pain in B knee/lower legs (L worse than R).  Pt. Reports his cat passed away on January 24, 2023 night.  Pt. States he has a lot of family stuff going on and still dealing with loss of mom/ step dtr.    PAIN:  Are you having pain? Yes: NPRS scale: 6-7/10 Pain location: B knee Pain description: aching Aggravating factors: prolonged standing/ walking Relieving factors: rest   TODAY'S TREATMENT:   11/07/22:    Nustep L5 10+ min. B UE/LE.  Seat position #10/ arm position #10.  0.58 miles.  HR: 85 bpm.  O2 sat: 94%.  Nautilus: 60# seated lat. Pull downs with wand 30x.  50# standing tricep extension 30x.  Seated rest break.  STS 5x from gray chair.    Recip. Stairs with heavy UE assist (4x).  Increase L knee pain reported with descending stairs.  No LOB.    Walking in //-bars with alt. UE/LE touches. Recip. step pattern/ hip flexion.  Lateral walking L/R in //-bar with no UE assist (cuing to increase step length).  4 laps each.  Seated rest break required.      Walking in gym with focus on step pattern/ heel strike while carrying SPC.      Standing partial step lunges without UE assist in //-bars 10x L/R.    No change to HEP       PATIENT EDUCATION: Education details: HEP/ daily activity Person educated: Patient Education method: Customer service manager Education comprehension: verbalized understanding and returned demonstration   HOME EXERCISE PROGRAM: Handouts given for shoulder ROM/ strengthening/ standing hip ex.    PT Short Term Goals -  11/07/22      PT SHORT TERM GOAL #1   Title Pt able to complete 6MWT and ambulate >800 feet with no assistive device safely.     Baseline 10/2:  620 feet.  HR: 65 bpm/ O2 sat. 95%.   Time 4    Period Weeks    Status Not met              PT Long Term Goals -  12/05/2022      PT LONG TERM GOAL #1   Title Pt will improve FOTO score to  50 to display improvement in functional mobility.    Baseline 6/7: 33.  10/23: 52   Time 8   Period Weeks    Status Goal met on 10/23   Target Date 12/05/2022     PT LONG TERM GOAL #2   Title Pt wil improve TUG score to < 12 sec with no AD to display significant imporvement in reduced risk of falls    Baseline 11/23: 26 sec (did not sit down immediately leading to higher time than he would've scored) 1/19: 14.44 sec.   6/7: 12.66 sec.    Time 8  Period Weeks    Status Partially Met    Target Date 12/05/2022     PT LONG TERM GOAL #3   Title Pt will improve DGI to >19/24 to indicate decreased risk of falls with household and community walking tasks    Baseline 11/23: 11/24 1/19:12/24    Time 8    Period Weeks    Status Partially Met    Target Date 12/05/2022     PT LONG TERM GOAL #4   Title Pt. Will increase R hip flexion/ LE muscle strength 1/2 muscle grade to improve standing tolerance/ walking endurance.    Baseline See above   Time 8    Period Weeks    Status Partially Met    Target Date 12/05/2022           Plan -     Clinical Impression Statement Pt. Works hard during tx. Session and requires several short seated rest breaks after there.ex. secondary to fatigue/ SOB.  Pt. Has difficulty/ requires extra time to complete partial step lunges in //-bars without UE assist.  Pt. C/o persistent B knee/ lower leg discomfort and presents with significant pitting edema.  No LOB during tx. And pt. Does benefit from assistive device due to fatigue/ LE swelling and weakness.   Pt. Demonstrates recip. Gait pattern in clinic without SPC but slow, antalgic gait while walking outside.  Pt will continue to benefit from skilled PT services to progress strength and mobility, along with cardiovascular endurance.    Personal Factors and Comorbidities Comorbidity 2    Comorbidities Hypertension, Obesity    Examination-Activity Limitations Bed Mobility;Reach  Overhead;Squat;Lift;Dressing;Hygiene/Grooming    Examination-Participation Restrictions Community Activity;Yard Work    [employer]chant [employer] officer Evolving/Moderate complexity    Clinical Decision Making Moderate    Rehab Potential Fair    PT Frequency 1x / week    PT Duration 8 weeks    PT Treatment/Interventions ADLs/Self Care Home Management;Gait training;Stair training;Functional mobility training;Therapeutic activities;Therapeutic exercise;Balance training;Neuromuscular re-education;Manual techniques;Patient/family education;Electrical Stimulation;Moist Heat;Cryotherapy    PT Next Visit Plan Progress LE strength/ endurance/ pain mgmt.      PT Home Exercise Plan Shoulder pulleys in flex, scaption, abd, pendelums, scap retractions with resistance (RTB), D2 flexion pattern in sitting no resistance.    Consulted and Agree with Plan of Care Patient          Pura Spice, PT, DPT # [id number] 11/07/2022, 1:02 PM

## 2022-11-14 ENCOUNTER — Ambulatory Visit: Payer: Medicare Other | Admitting: Physical Therapy

## 2022-11-14 DIAGNOSIS — R269 Unspecified abnormalities of gait and mobility: Secondary | ICD-10-CM | POA: Diagnosis not present

## 2022-11-14 DIAGNOSIS — R279 Unspecified lack of coordination: Secondary | ICD-10-CM

## 2022-11-14 DIAGNOSIS — R262 Difficulty in walking, not elsewhere classified: Secondary | ICD-10-CM | POA: Diagnosis not present

## 2022-11-14 DIAGNOSIS — M25611 Stiffness of right shoulder, not elsewhere classified: Secondary | ICD-10-CM

## 2022-11-14 DIAGNOSIS — R2681 Unsteadiness on feet: Secondary | ICD-10-CM | POA: Diagnosis not present

## 2022-11-14 DIAGNOSIS — M6281 Muscle weakness (generalized): Secondary | ICD-10-CM

## 2022-11-14 DIAGNOSIS — R2689 Other abnormalities of gait and mobility: Secondary | ICD-10-CM | POA: Diagnosis not present

## 2022-11-14 NOTE — Therapy (Signed)
OUTPATIENT PHYSICAL THERAPY TREATMENT   Patient Name: Tom Underwood MRN: [id number] DOB:10/02/1951, 71 y.o., male Today's Date: 11/14/2022  PCP: Steele Sizer, MD REFERRING PROVIDER: Myles Gip, DO   PT End of Session - 11/14/22 1307     Visit Number 27    Number of Visits 30    Date for PT Re-Evaluation 12/05/22    PT Start Time 1300    PT Stop Time 1409    PT Time Calculation (min) 69 min    Activity Tolerance Patient tolerated treatment well;Patient limited by fatigue    Behavior During Therapy WFL for tasks assessed/performed              Past Medical History:  Diagnosis Date   Anxiety    Arthritis    Atrial fibrillation ([place])    a.) CHA2DS2-VASc = 4 (age, HTN, CVA x2). b.) s/p 200 J synchronized cardioversion (DCCV) on 12/08/2021. c.) rate/rythum maintained on oral metoprolol tartrate; chronically anticoagulated using rivaroxaban.   BPH (benign prostatic hyperplasia)    Complication of anesthesia    a.)  Intolerant of propofol; causes "skin burning and involuntary muscle twitching"   DDD (degenerative disc disease), lumbar    Decreased libido    Depression    Diverticulitis    Diverticulosis    GERD (gastroesophageal reflux disease)    Hepatic steatosis    History of 2019 novel coronavirus disease (COVID-19) 12/28/2020   History of cardiac murmur in childhood    History of kidney stones    HLD (hyperlipidemia)    HLD (hyperlipidemia)    Hypertension    Hypogonadism in male    Long term current use of anticoagulant    a.) rivaroxaban   OSA on CPAP    Pre-diabetes    Rhinitis, allergic    Stroke ([place]) 07/23/2019   a.) LEFT posterior cor radiata. b.) residual RIGHT sided weakness   Past Surgical History:  Procedure Laterality Date   CARDIOVERSION N/A 12/08/2021   Procedure: CARDIOVERSION;  Surgeon: Nelva Bush, MD;  Location: [facility] ORS;  Service: Cardiovascular;  Laterality: N/A;   COLONOSCOPY     EVALUATION UNDER ANESTHESIA WITH  HEMORRHOIDECTOMY N/A 04/15/2022   Procedure: EXAM UNDER ANESTHESIA WITH HEMORRHOIDECTOMY;  Surgeon: Benjamine Sprague, DO;  Location: [facility] OR[facility];  Service: General;  Laterality: N/A;   EXTERNAL EAR SURGERY     EYE SURGERY     FINGER SURGERY Right    lateration to 5 th digit   KNEE SURGERY     left eye     RECTAL EXAM UNDER ANESTHESIA N/A 04/23/2022   Procedure: RECTAL EXAM UNDER ANESTHESIA WITH LIGATION OF BLEEDING;  Surgeon: Olean Ree, MD;  Location: [facility] ORS;  Service: General;  Laterality: N/A;   Patient Active Problem List   Diagnosis Date Noted   Gastroesophageal reflux disease without esophagitis 05/17/2022   Dysthymia 05/17/2022   History of syncope 05/17/2022   History of hemorrhoidectomy 05/17/2022   Vitamin D deficiency 05/17/2022   Lower extremity edema 05/17/2022   History of rectal bleeding 05/17/2022   Bleeding internal hemorrhoids    Morbid obesity ([place])    Persistent atrial fibrillation ([place]) 07/14/2020   Hypertension    Hemiparesis of right dominant side as late effect of cerebral infarction ([place]) 08/13/2019   History of kidney stones 05/02/2018   ED (erectile dysfunction) 05/02/2018   Hyperglycemia 01/30/2017   Arthritis of knee, degenerative 01/30/2017   Benign prostatic hyperplasia without lower urinary tract symptoms 02/03/2016   Dyslipidemia 08/04/2015  Diverticulitis of large intestine without perforation or abscess without bleeding 06/30/2015   OSA on CPAP 06/30/2015    REFERRING DIAG: M25.511,M25.512 (ICD-10-CM) - Bilateral shoulder pain, unspecified chronicity M17.2 (ICD-10-CM) - Post-traumatic osteoarthritis of both knees  R26.81 (ICD-10-CM) - Gait instability    THERAPY DIAG:  Difficulty in walking, not elsewhere classified  Muscle weakness (generalized)  Unsteadiness on feet  Abnormality of gait and mobility  Unspecified lack of coordination  Other abnormalities of gait and mobility  Decreased range of motion of right shoulder  PERTINENT  HISTORY: See evaluation  PRECAUTIONS: Fall   10/2:  6MWT: 38 feet x 16 laps plus 12 feet (620 feet).  HR: 65 bpm/ O2 sat. 95%.   SUBJECTIVE:  11/14/22:  Pt. Reports 12/10 pain in B knee/lower legs (L worse than R).  Pt. States he has been taking sponge baths because of difficulty stepping into tub without assistance.  PT discussed use of grab bar and assist for safety.      PAIN:  Are you having pain? Yes: NPRS scale: 12/10 Pain location: B knee Pain description: aching Aggravating factors: prolonged standing/ walking Relieving factors: rest   TODAY'S TREATMENT:   11/14/22:    Walking in //-bars with marching and alt. UE/LE touches. Recip. step pattern/ hip flexion.  Lateral walking L/R in //-bar with no UE assist (cuing to increase step length).  5 laps each.  Seated rest break required.  O2: 96%, HR: 70 bpm  Nautilus: 60# seated lat. Pull downs with wand 30x.  50# standing tricep extension/ 50# scap. Retraction (alt. Feet for balance) 30x.   Seated rest break.  STS 5x from gray chair.    Recip. Stairs with heavy UE assist (4x).  Increase L knee pain reported with descending stairs.  No LOB.     Walking over 6" hurdles with cuing to correct posture. Focused on leading with R LE first.   Reviewed ability to step into tub.     Walking in gym with focus on step pattern/ heel strike while carrying SPC.      Nustep L5 10+ min. B UE/LE.  Seat position #10/ arm position #10.  0.58 miles.  HR: 85 bpm.  O2 sat: 94%.  No change to HEP       PATIENT EDUCATION: Education details: HEP/ daily activity Person educated: Patient Education method: Customer service manager Education comprehension: verbalized understanding and returned demonstration   HOME EXERCISE PROGRAM: Handouts given for shoulder ROM/ strengthening/ standing hip ex.    PT Short Term Goals -  11/07/22      PT SHORT TERM GOAL #1   Title Pt able to complete 6MWT and ambulate >800 feet with no assistive device  safely.     Baseline 10/2:  620 feet.  HR: 65 bpm/ O2 sat. 95%.   Time 4    Period Weeks    Status Not met              PT Long Term Goals -  12/05/2022      PT LONG TERM GOAL #1   Title Pt will improve FOTO score to 50 to display improvement in functional mobility.    Baseline 6/7: 33.  10/23: 52   Time 8   Period Weeks    Status Goal met on 10/23   Target Date 12/05/2022     PT LONG TERM GOAL #2   Title Pt wil improve TUG score to < 12 sec with no AD to display significant  imporvement in reduced risk of falls    Baseline 11/23: 26 sec (did not sit down immediately leading to higher time than he would've scored) 1/19: 14.44 sec.   6/7: 12.66 sec.    Time 8   Period Weeks    Status Partially Met    Target Date 12/05/2022     PT LONG TERM GOAL #3   Title Pt will improve DGI to >19/24 to indicate decreased risk of falls with household and community walking tasks    Baseline 11/23: 11/24 1/19:12/24    Time 8    Period Weeks    Status Partially Met    Target Date 12/05/2022     PT LONG TERM GOAL #4   Title Pt. Will increase R hip flexion/ LE muscle strength 1/2 muscle grade to improve standing tolerance/ walking endurance.    Baseline See above   Time 8    Period Weeks    Status Partially Met    Target Date 12/05/2022           Plan -     Clinical Impression Statement Pt. Works hard during tx. Session and requires several short seated rest breaks after there.ex. secondary to fatigue/ SOB.  Pt. Has difficulty/ requires extra time to complete partial step lunges in //-bars without UE assist.  Pt. C/o persistent B knee/ lower leg discomfort and presents with significant pitting edema.  No LOB during tx. And pt. Does benefit from assistive device due to fatigue/ LE swelling and weakness.   Pt. Demonstrates recip. Gait pattern in clinic without SPC but slow, antalgic gait while walking outside.  Pt will continue to benefit from skilled PT services to progress strength and mobility,  along with cardiovascular endurance.    Personal Factors and Comorbidities Comorbidity 2    Comorbidities Hypertension, Obesity    Examination-Activity Limitations Bed Mobility;Reach Overhead;Squat;Lift;Dressing;Hygiene/Grooming    Examination-Participation Restrictions Community Activity;Yard Work    Merchant navy officer Evolving/Moderate complexity    Clinical Decision Making Moderate    Rehab Potential Fair    PT Frequency 1x / week    PT Duration 8 weeks    PT Treatment/Interventions ADLs/Self Care Home Management;Gait training;Stair training;Functional mobility training;Therapeutic activities;Therapeutic exercise;Balance training;Neuromuscular re-education;Manual techniques;Patient/family education;Electrical Stimulation;Moist Heat;Cryotherapy    PT Next Visit Plan Progress LE strength/ endurance/ pain mgmt.      PT Home Exercise Plan Shoulder pulleys in flex, scaption, abd, pendelums, scap retractions with resistance (RTB), D2 flexion pattern in sitting no resistance.    Consulted and Agree with Plan of Care Patient          Pura Spice, PT, DPT # [phone] 11/14/2022, 3:40 PM
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
## 2022-11-15 ENCOUNTER — Ambulatory Visit (INDEPENDENT_AMBULATORY_CARE_PROVIDER_SITE_OTHER): Payer: Medicare Other | Admitting: Family Medicine

## 2022-11-15 ENCOUNTER — Encounter: Payer: Self-pay | Admitting: Family Medicine

## 2022-11-15 VITALS — BP 132/74 | HR 70 | Temp 98.2°F | Resp 20 | Ht 70.0 in | Wt 303.1 lb

## 2022-11-15 DIAGNOSIS — E785 Hyperlipidemia, unspecified: Secondary | ICD-10-CM

## 2022-11-15 DIAGNOSIS — I480 Paroxysmal atrial fibrillation: Secondary | ICD-10-CM

## 2022-11-15 DIAGNOSIS — N138 Other obstructive and reflux uropathy: Secondary | ICD-10-CM

## 2022-11-15 DIAGNOSIS — F341 Dysthymic disorder: Secondary | ICD-10-CM

## 2022-11-15 DIAGNOSIS — N400 Enlarged prostate without lower urinary tract symptoms: Secondary | ICD-10-CM

## 2022-11-15 DIAGNOSIS — G4733 Obstructive sleep apnea (adult) (pediatric): Secondary | ICD-10-CM

## 2022-11-15 DIAGNOSIS — Z9889 Other specified postprocedural states: Secondary | ICD-10-CM

## 2022-11-15 DIAGNOSIS — R2681 Unsteadiness on feet: Secondary | ICD-10-CM

## 2022-11-15 DIAGNOSIS — E559 Vitamin D deficiency, unspecified: Secondary | ICD-10-CM | POA: Diagnosis not present

## 2022-11-15 DIAGNOSIS — I10 Essential (primary) hypertension: Secondary | ICD-10-CM

## 2022-11-15 DIAGNOSIS — Z8719 Personal history of other diseases of the digestive system: Secondary | ICD-10-CM | POA: Diagnosis not present

## 2022-11-15 DIAGNOSIS — N401 Enlarged prostate with lower urinary tract symptoms: Secondary | ICD-10-CM | POA: Diagnosis not present

## 2022-11-21 ENCOUNTER — Ambulatory Visit: Payer: Medicare Other | Admitting: Physical Therapy

## 2022-11-21 DIAGNOSIS — R269 Unspecified abnormalities of gait and mobility: Secondary | ICD-10-CM

## 2022-11-21 DIAGNOSIS — R279 Unspecified lack of coordination: Secondary | ICD-10-CM | POA: Diagnosis not present

## 2022-11-21 DIAGNOSIS — R2681 Unsteadiness on feet: Secondary | ICD-10-CM | POA: Diagnosis not present

## 2022-11-21 DIAGNOSIS — R262 Difficulty in walking, not elsewhere classified: Secondary | ICD-10-CM | POA: Diagnosis not present

## 2022-11-21 DIAGNOSIS — M6281 Muscle weakness (generalized): Secondary | ICD-10-CM

## 2022-11-21 DIAGNOSIS — R2689 Other abnormalities of gait and mobility: Secondary | ICD-10-CM | POA: Diagnosis not present

## 2022-11-21 NOTE — Therapy (Signed)
OUTPATIENT PHYSICAL THERAPY TREATMENT   Patient Name: Tom Underwood MRN: [id number] DOB:1951/03/27, 71 y.o., male Today's Date: 11/21/2022  PCP: Steele Sizer, MD REFERRING PROVIDER: Myles Gip, DO   PT End of Session - 11/21/22 1305     Visit Number 28    Number of Visits 30    Date for PT Re-Evaluation 12/05/22    PT Start Time 1301    PT Stop Time 1356    PT Time Calculation (min) 55 min    Activity Tolerance Patient tolerated treatment well;Patient limited by fatigue    Behavior During Therapy WFL for tasks assessed/performed             Past Medical History:  Diagnosis Date   Anxiety    Arthritis    Atrial fibrillation ([place])    a.) CHA2DS2-VASc = 4 (age, HTN, CVA x2). b.) s/p 200 J synchronized cardioversion (DCCV) on 12/08/2021. c.) rate/rythum maintained on oral metoprolol tartrate; chronically anticoagulated using rivaroxaban.   BPH (benign prostatic hyperplasia)    Complication of anesthesia    a.)  Intolerant of propofol; causes "skin burning and involuntary muscle twitching"   DDD (degenerative disc disease), lumbar    Decreased libido    Depression    Diverticulitis    Diverticulosis    GERD (gastroesophageal reflux disease)    Hepatic steatosis    History of 2019 novel coronavirus disease (COVID-19) 12/28/2020   History of cardiac murmur in childhood    History of kidney stones    HLD (hyperlipidemia)    HLD (hyperlipidemia)    Hypertension    Hypogonadism in male    Long term current use of anticoagulant    a.) rivaroxaban   OSA on CPAP    Pre-diabetes    Rhinitis, allergic    Stroke ([place]) 07/23/2019   a.) LEFT posterior cor radiata. b.) residual RIGHT sided weakness   Past Surgical History:  Procedure Laterality Date   CARDIOVERSION N/A 12/08/2021   Procedure: CARDIOVERSION;  Surgeon: Nelva Bush, MD;  Location: [facility];  Service: Cardiovascular;  Laterality: N/A;   COLONOSCOPY     EVALUATION UNDER ANESTHESIA WITH  HEMORRHOIDECTOMY N/A 04/15/2022   Procedure: EXAM UNDER ANESTHESIA WITH HEMORRHOIDECTOMY;  Surgeon: Benjamine Sprague, DO;  Location: [facility];  Service: General;  Laterality: N/A;   EXTERNAL EAR SURGERY     EYE SURGERY     FINGER SURGERY Right    lateration to 5 th digit   KNEE SURGERY     left eye     RECTAL EXAM UNDER ANESTHESIA N/A 04/23/2022   Procedure: RECTAL EXAM UNDER ANESTHESIA WITH LIGATION OF BLEEDING;  Surgeon: Olean Ree, MD;  Location: [facility];  Service: General;  Laterality: N/A;   Patient Active Problem List   Diagnosis Date Noted   Gastroesophageal reflux disease without esophagitis 05/17/2022   Dysthymia 05/17/2022   History of syncope 05/17/2022   History of hemorrhoidectomy 05/17/2022   Vitamin D deficiency 05/17/2022   Lower extremity edema 05/17/2022   History of rectal bleeding 05/17/2022   Bleeding internal hemorrhoids    Morbid obesity ([place])    Persistent atrial fibrillation ([place]) 07/14/2020   Hypertension    Hemiparesis of right dominant side as late effect of cerebral infarction ([place]) 08/13/2019   History of kidney stones 05/02/2018   ED (erectile dysfunction) 05/02/2018   Hyperglycemia 01/30/2017   Arthritis of knee, degenerative 01/30/2017   Benign prostatic hyperplasia without lower urinary tract symptoms 02/03/2016   Dyslipidemia 08/04/2015  Diverticulitis of large intestine without perforation or abscess without bleeding 06/30/2015   OSA on CPAP 06/30/2015    REFERRING DIAG: M25.511,M25.512 (ICD-10-CM) - Bilateral shoulder pain, unspecified chronicity M17.2 (ICD-10-CM) - Post-traumatic osteoarthritis of both knees  R26.81 (ICD-10-CM) - Gait instability    THERAPY DIAG:  Difficulty in walking, not elsewhere classified  Muscle weakness (generalized)  Unsteadiness on feet  Abnormality of gait and mobility  Unspecified lack of coordination  Other abnormalities of gait and mobility  PERTINENT HISTORY: See evaluation  PRECAUTIONS:  Fall   10/2:  6MWT: 38 feet x 16 laps plus 12 feet (620 feet).  HR: 65 bpm/ O2 sat. 95%.   SUBJECTIVE:  11/21/22:  Pt. Reports 13/10 pain in B knee/lower legs (L worse than R).  Pt. Reports feeling a little short of breath prior to tx. Session.  Vitals: 131/66.  HR 64, O2 sat: 94%  PAIN:  Are you having pain? Yes: NPRS scale: 13/10 Pain location: B knee Pain description: aching Aggravating factors: prolonged standing/ walking Relieving factors: rest   TODAY'S TREATMENT:   11/21/22:    Nustep L5 10+ min. B UE/LE.  Seat position #10/ arm position #10.  0.54 miles.  HR: 72 bpm.  O2 sat: 95%.  Increase generalized muscle fatigue with reports of SOB (vitals are good).    Nautilus: 60# seated lat. Pull downs with wand 30x.  50# standing tricep extension 30x.  Seated rest break.     Walking in //-bars with 6" and 12" hurdles with light to no UE assist.   SBA/CGA for safety and cuing.  Pharmacist, hospital.    Walking in //-bars with alt. UE/LE touches. Recip. step pattern/ hip flexion.  Lateral walking L/R in //-bar with no UE assist (cuing to increase step length).  4 laps each.  Seated rest break required.      Walking in gym with focus on step pattern/ heel strike while carrying SPC.      Reviewed standing ex./ daily walking program.      PATIENT EDUCATION: Education details: HEP/ daily activity Person educated: Patient Education method: Customer service manager Education comprehension: verbalized understanding and returned demonstration   HOME EXERCISE PROGRAM: Handouts given for shoulder ROM/ strengthening/ standing hip ex.    PT Short Term Goals -  11/07/22      PT SHORT TERM GOAL #1   Title Pt able to complete 6MWT and ambulate >800 feet with no assistive device safely.     Baseline 10/2:  620 feet.  HR: 65 bpm/ O2 sat. 95%.   Time 4    Period Weeks    Status Not met              PT Long Term Goals -  12/05/2022      PT LONG TERM GOAL #1   Title Pt will  improve FOTO score to 50 to display improvement in functional mobility.    Baseline 6/7: 33.  10/23: 52   Time 8   Period Weeks    Status Goal met on 10/23   Target Date 12/05/2022     PT LONG TERM GOAL #2   Title Pt wil improve TUG score to < 12 sec with no AD to display significant imporvement in reduced risk of falls    Baseline 11/23: 26 sec (did not sit down immediately leading to higher time than he would've scored) 1/19: 14.44 sec.   6/7: 12.66 sec.    Time 8   Period Weeks  Status Partially Met    Target Date 12/05/2022     PT LONG TERM GOAL #3   Title Pt will improve DGI to >19/24 to indicate decreased risk of falls with household and community walking tasks    Baseline 11/23: 11/24 1/19:12/24    Time 8    Period Weeks    Status Partially Met    Target Date 12/05/2022     PT LONG TERM GOAL #4   Title Pt. Will increase R hip flexion/ LE muscle strength 1/2 muscle grade to improve standing tolerance/ walking endurance.    Baseline See above   Time 8    Period Weeks    Status Partially Met    Target Date 12/05/2022           Plan -     Clinical Impression Statement Pt. Works hard during tx. Session and requires several short seated rest breaks after there.ex. secondary to fatigue/ SOB.  Pt. Had several mistakes with initial step overs for 12" hurdles.  Pt. Benefits from light UE assist in //-bars for safety.  Pt. C/o persistent B knee/ lower leg discomfort and presents with significant pitting edema.  No LOB during tx. And pt. Does benefit from assistive device due to fatigue/ LE swelling and weakness.   Pt. Demonstrates recip. Gait pattern in clinic without SPC but slow, antalgic gait while walking outside.  Pt will continue to benefit from skilled PT services to progress strength and mobility, along with cardiovascular endurance.    Personal Factors and Comorbidities Comorbidity 2    Comorbidities Hypertension, Obesity    Examination-Activity Limitations Bed Mobility;Reach  Overhead;Squat;Lift;Dressing;Hygiene/Grooming    Examination-Participation Restrictions Community Activity;Yard Work    Merchant navy officer Evolving/Moderate complexity    Clinical Decision Making Moderate    Rehab Potential Fair    PT Frequency 1x / week    PT Duration 8 weeks    PT Treatment/Interventions ADLs/Self Care Home Management;Gait training;Stair training;Functional mobility training;Therapeutic activities;Therapeutic exercise;Balance training;Neuromuscular re-education;Manual techniques;Patient/family education;Electrical Stimulation;Moist Heat;Cryotherapy    PT Next Visit Plan Progress LE strength/ endurance/ pain mgmt.      PT Home Exercise Plan Shoulder pulleys in flex, scaption, abd, pendelums, scap retractions with resistance (RTB), D2 flexion pattern in sitting no resistance.    Consulted and Agree with Plan of Care Patient          Pura Spice, PT, DPT # [phone] 11/22/2022, 2:54 PM

## 2022-11-30 ENCOUNTER — Encounter: Payer: Medicare Other | Admitting: Physical Therapy

## 2022-12-07 ENCOUNTER — Ambulatory Visit: Payer: Medicare Other | Admitting: Physical Therapy

## 2022-12-12 ENCOUNTER — Ambulatory Visit: Payer: Medicare Other | Attending: Family Medicine | Admitting: Physical Therapy

## 2022-12-12 DIAGNOSIS — M25611 Stiffness of right shoulder, not elsewhere classified: Secondary | ICD-10-CM | POA: Insufficient documentation

## 2022-12-12 DIAGNOSIS — R262 Difficulty in walking, not elsewhere classified: Secondary | ICD-10-CM | POA: Insufficient documentation

## 2022-12-12 DIAGNOSIS — R2681 Unsteadiness on feet: Secondary | ICD-10-CM | POA: Insufficient documentation

## 2022-12-12 DIAGNOSIS — M6281 Muscle weakness (generalized): Secondary | ICD-10-CM | POA: Insufficient documentation

## 2022-12-12 DIAGNOSIS — R2689 Other abnormalities of gait and mobility: Secondary | ICD-10-CM | POA: Insufficient documentation

## 2022-12-12 DIAGNOSIS — R279 Unspecified lack of coordination: Secondary | ICD-10-CM | POA: Insufficient documentation

## 2022-12-12 DIAGNOSIS — R269 Unspecified abnormalities of gait and mobility: Secondary | ICD-10-CM | POA: Insufficient documentation

## 2022-12-14 ENCOUNTER — Encounter: Payer: Self-pay | Admitting: Physical Therapy

## 2022-12-14 ENCOUNTER — Ambulatory Visit: Payer: Medicare Other | Admitting: Physical Therapy

## 2022-12-14 DIAGNOSIS — R262 Difficulty in walking, not elsewhere classified: Secondary | ICD-10-CM

## 2022-12-14 DIAGNOSIS — M6281 Muscle weakness (generalized): Secondary | ICD-10-CM | POA: Diagnosis not present

## 2022-12-14 DIAGNOSIS — R269 Unspecified abnormalities of gait and mobility: Secondary | ICD-10-CM | POA: Diagnosis not present

## 2022-12-14 DIAGNOSIS — R2689 Other abnormalities of gait and mobility: Secondary | ICD-10-CM | POA: Diagnosis not present

## 2022-12-14 DIAGNOSIS — M25611 Stiffness of right shoulder, not elsewhere classified: Secondary | ICD-10-CM | POA: Diagnosis not present

## 2022-12-14 DIAGNOSIS — R279 Unspecified lack of coordination: Secondary | ICD-10-CM | POA: Diagnosis not present

## 2022-12-14 DIAGNOSIS — R2681 Unsteadiness on feet: Secondary | ICD-10-CM | POA: Diagnosis not present

## 2022-12-14 NOTE — Therapy (Signed)
OUTPATIENT PHYSICAL THERAPY TREATMENT/RECERTIFICATION   Patient Name: Tom Underwood MRN: [id number] DOB:Jun 26, 1951, 72 y.o., male Today's Date: 12/14/2022  PCP: Myles Gip, DO REFERRING PROVIDER: Myles Gip, DO   PT End of Session - 12/14/22 1315     Visit Number 44 of 42   Date for PT Re-Evaluation 03/08/23    PT Start Time 1254 to 1402    Activity Tolerance Patient tolerated treatment well;Patient limited by fatigue    Behavior During Therapy [facility] for tasks assessed/performed             Past Medical History:  Diagnosis Date   Anxiety    Arthritis    Atrial fibrillation ([place])    a.) CHA2DS2-VASc = 4 (age, HTN, CVA x2). b.) s/p 200 J synchronized cardioversion (DCCV) on 12/08/2021. c.) rate/rythum maintained on oral metoprolol tartrate; chronically anticoagulated using rivaroxaban.   BPH (benign prostatic hyperplasia)    Complication of anesthesia    a.)  Intolerant of propofol; causes "skin burning and involuntary muscle twitching"   DDD (degenerative disc disease), lumbar    Decreased libido    Depression    Diverticulitis    Diverticulosis    GERD (gastroesophageal reflux disease)    Hepatic steatosis    History of 2019 novel coronavirus disease (COVID-19) 12/28/2020   History of cardiac murmur in childhood    History of kidney stones    HLD (hyperlipidemia)    HLD (hyperlipidemia)    Hypertension    Hypogonadism in male    Long term current use of anticoagulant    a.) rivaroxaban   OSA on CPAP    Pre-diabetes    Rhinitis, allergic    Stroke ([place]) 07/23/2019   a.) LEFT posterior cor radiata. b.) residual RIGHT sided weakness   Past Surgical History:  Procedure Laterality Date   CARDIOVERSION N/A 12/08/2021   Procedure: CARDIOVERSION;  Surgeon: Nelva Bush, MD;  Location: [facility];  Service: Cardiovascular;  Laterality: N/A;   COLONOSCOPY     EVALUATION UNDER ANESTHESIA WITH HEMORRHOIDECTOMY N/A 04/15/2022   Procedure: EXAM UNDER  ANESTHESIA WITH HEMORRHOIDECTOMY;  Surgeon: Benjamine Sprague, DO;  Location: [facility];  Service: General;  Laterality: N/A;   EXTERNAL EAR SURGERY     EYE SURGERY     FINGER SURGERY Right    lateration to 5 th digit   KNEE SURGERY     left eye     RECTAL EXAM UNDER ANESTHESIA N/A 04/23/2022   Procedure: RECTAL EXAM UNDER ANESTHESIA WITH LIGATION OF BLEEDING;  Surgeon: Olean Ree, MD;  Location: [facility];  Service: General;  Laterality: N/A;   Patient Active Problem List   Diagnosis Date Noted   Gastroesophageal reflux disease without esophagitis 05/17/2022   Dysthymia 05/17/2022   History of syncope 05/17/2022   History of hemorrhoidectomy 05/17/2022   Vitamin D deficiency 05/17/2022   Lower extremity edema 05/17/2022   History of rectal bleeding 05/17/2022   Bleeding internal hemorrhoids    Morbid obesity ([place])    Persistent atrial fibrillation ([place]) 07/14/2020   Hypertension    Hemiparesis of right dominant side as late effect of cerebral infarction ([place]) 08/13/2019   History of kidney stones 05/02/2018   ED (erectile dysfunction) 05/02/2018   Hyperglycemia 01/30/2017   Arthritis of knee, degenerative 01/30/2017   Benign prostatic hyperplasia without lower urinary tract symptoms 02/03/2016   Dyslipidemia 08/04/2015   Diverticulitis of large intestine without perforation or abscess without bleeding 06/30/2015   OSA on CPAP 06/30/2015  REFERRING DIAG: M25.511,M25.512 (ICD-10-CM) - Bilateral shoulder pain, unspecified chronicity M17.2 (ICD-10-CM) - Post-traumatic osteoarthritis of both knees  R26.81 (ICD-10-CM) - Gait instability    THERAPY DIAG:  Difficulty in walking, not elsewhere classified  Muscle weakness (generalized)  Unsteadiness on feet  Abnormality of gait and mobility  Unspecified lack of coordination  Other abnormalities of gait and mobility  Decreased range of motion of right shoulder  PERTINENT HISTORY: See evaluation  PRECAUTIONS: Fall   10/2:   : 38 feet x 16 laps plus 12 feet (620 feet).  HR: 65 bpm/ O2 sat. 95%.   SUBJECTIVE:  12/14/22:  Pt. Reports 9.5/10 pain in B knee/lower legs (L worse than R).  Pt. Reports feeling a little short of breath prior to tx. Session.  HR 64, O2 sat: 94%.  Pt. Has not been to PT over past several weeks due to holiday and illness.  No falls reported.    PAIN:  Are you having pain? Yes: NPRS scale: 9.5/10 Pain location: B knee Pain description: aching Aggravating factors: prolonged standing/ walking Relieving factors: rest   TODAY'S TREATMENT:   12/14/22:    Nustep L5 10+ min. B UE/LE.  Seat position #10/ arm position #10.  0.59 miles.  HR: 74 bpm.  O2 sat: 96%.  Increase generalized muscle fatigue with reports of SOB (vitals are good).    Walking in //-bars with 6" and 12" hurdles with light to no UE assist.   SBA/CGA for safety and cuing.  Banker.    Walking in //-bars with alt. UE/LE touches. Recip. step pattern/ hip flexion.  Lateral walking L/R in //-bar with no UE assist (cuing to increase step length).  4 laps each.  Seated rest break required.   Nautilus: 60# seated lat. Pull downs with wand 30x.  50# standing tricep extension 30x.  Seated rest break.     Walking in gym with focus on step pattern/ heel strike while carrying SPC.    Reviewed standing ex./ daily walking program.      PATIENT EDUCATION: Education details: HEP/ daily activity Person educated: Patient Education method: Medical illustrator Education comprehension: verbalized understanding and returned demonstration   HOME EXERCISE PROGRAM: Handouts given for shoulder ROM/ strengthening/ standing hip ex.    PT Short Term Goals -  01/11/23      PT SHORT TERM GOAL #1   Title Pt able to complete and ambulate >800 feet with no assistive device safely.     Baseline 10/2:  620 feet.  HR: 65 bpm/ O2 sat. 95%.   Time 4    Period Weeks    Status Not met              PT Long Term Goals  -  03/08/2023      PT LONG TERM GOAL #1   Title Pt will improve FOTO score to 50 to display improvement in functional mobility.    Baseline 6/7: 33.  10/23: 52   Time 8   Period Weeks    Status Goal met on 10/23   Target Date 12/05/2022     PT LONG TERM GOAL #2   Title Pt wil improve TUG score to < 12 sec with no AD to display significant imporvement in reduced risk of falls    Baseline 11/23: 26 sec (did not sit down immediately leading to higher time than he would've scored) 1/19: 14.44 sec.   6/7: 12.66 sec.    Time 8   Period  Weeks    Status Partially Met    Target Date 03/08/2023     PT LONG TERM GOAL #3   Title Pt will improve DGI to >19/24 to indicate decreased risk of falls with household and community walking tasks    Baseline 11/23: 11/24 1/19:12/24    Time 8    Period Weeks    Status Partially Met    Target Date 03/08/2023     PT LONG TERM GOAL #4   Title Pt. Will increase R hip flexion/ LE muscle strength 1/2 muscle grade to improve standing tolerance/ walking endurance.    Baseline See above   Time 8    Period Weeks    Status Partially Met    Target Date 03/08/2023           Plan -     Clinical Impression Statement Pt. Works hard during tx. Session and requires several short seated rest breaks after there.ex. secondary to fatigue/ SOB.  Pt. Had several mistakes with initial step overs for 12" hurdles.  Pt. Benefits from light UE assist in //-bars for safety.  Pt. C/o persistent B knee/ lower leg discomfort and presents with significant pitting edema.  No LOB during tx. And pt. Does benefit from assistive device due to fatigue/ LE swelling and weakness.   Pt. Demonstrates recip. Gait pattern in clinic without SPC but slow, antalgic gait while walking outside.  Pt will continue to benefit from skilled PT services to progress strength and mobility, along with cardiovascular endurance.    Personal Factors and Comorbidities Comorbidity 2    Comorbidities Hypertension,  Obesity    Examination-Activity Limitations Bed Mobility;Reach Overhead;Squat;Lift;Dressing;Hygiene/Grooming    Examination-Participation Restrictions Community Activity;Yard Work    Merchant navy officer Evolving/Moderate complexity    Clinical Decision Making Moderate    Rehab Potential Fair    PT Frequency 1x / week    PT Duration 12 weeks    PT Treatment/Interventions ADLs/Self Care Home Management;Gait training;Stair training;Functional mobility training;Therapeutic activities;Therapeutic exercise;Balance training;Neuromuscular re-education;Manual techniques;Patient/family education;Electrical Stimulation;Moist Heat;Cryotherapy    PT Next Visit Plan Progress LE strength/ endurance/ pain mgmt.      PT Home Exercise Plan Shoulder pulleys in flex, scaption, abd, pendelums, scap retractions with resistance (RTB), D2 flexion pattern in sitting no resistance.    Consulted and Agree with Plan of Care Patient          Pura Spice, PT, DPT # [phone] 12/14/2022, 1:19 PM

## 2022-12-16 ENCOUNTER — Other Ambulatory Visit: Payer: Self-pay | Admitting: Family Medicine

## 2022-12-16 DIAGNOSIS — E785 Hyperlipidemia, unspecified: Secondary | ICD-10-CM

## 2022-12-16 MED ORDER — OMEGA-3-ACID ETHYL ESTERS 1 G PO CAPS: 2.0000 g | ORAL_CAPSULE | Freq: Two times a day (BID) | ORAL | 1 refills | Status: DC

## 2022-12-16 NOTE — Telephone Encounter (Signed)
Requested Prescriptions  Pending Prescriptions Disp Refills   omega-3 acid ethyl esters (LOVAZA) 1 g capsule 360 capsule 1    Sig: Take 2 capsules (2 g total) by mouth 2 (two) times daily.     Endocrinology:  Nutritional Agents - omega-3 acid ethyl esters Failed - 12/16/2022 10:15 AM      Failed - Lipid Panel in normal range within the last 12 months    Cholesterol, Total  Date Value Ref Range Status  07/01/2022 116 100 - 199 mg/dL Final   LDL Chol Calc (NIH)  Date Value Ref Range Status  07/01/2022 55 0 - 99 mg/dL Final   HDL  Date Value Ref Range Status  07/01/2022 48 >39 mg/dL Final   Triglycerides  Date Value Ref Range Status  07/01/2022 59 0 - 149 mg/dL Final         Passed - Valid encounter within last 12 months    Recent Outpatient Visits           1 month ago Paroxysmal A-fib [facility])   [facility] Steele Sizer, MD   3 months ago Need for immunization against influenza   [facility] [place], Drue Stager, MD   7 months ago Hemiparesis of right dominant side as late effect of cerebral infarction [facility])   [facility] Steele Sizer, MD   9 months ago Upper respiratory tract infection, unspecified type   [place], DO   1 year ago Gait instability   [facility] Steele Sizer, MD       Future Appointments             In 1 week Agbor-Etang, Aaron Edelman, MD [place] [place]   In 2 months Ancil Boozer, Drue Stager, MD [facility] [place]   In 7 months McG[place]an, Gordan Payment [facility] [place]

## 2022-12-16 NOTE — Telephone Encounter (Signed)
Medication Refill - Medication: omega-3 acid ethyl esters (LOVAZA) 1 g capsule   Has the patient contacted their pharmacy? Yes.   Pharmacy referred pt to PCP  Pt requesting a 90 day supply  Preferred Pharmacy (with phone number or street name):  [id number] - [place], [address] Phone: [phone]  Fax: [phone]     Has the patient been seen for an appointment in the last year OR does the patient have an upcoming appointment? Yes.    Agent: Please be advised that RX refills may take up to 3 business days. We ask that you follow-up with your pharmacy.

## 2022-12-19 ENCOUNTER — Encounter: Payer: Self-pay | Admitting: Physical Therapy

## 2022-12-19 ENCOUNTER — Ambulatory Visit: Payer: Medicare Other | Admitting: Physical Therapy

## 2022-12-19 DIAGNOSIS — M6281 Muscle weakness (generalized): Secondary | ICD-10-CM | POA: Diagnosis not present

## 2022-12-19 DIAGNOSIS — R262 Difficulty in walking, not elsewhere classified: Secondary | ICD-10-CM

## 2022-12-19 DIAGNOSIS — R2681 Unsteadiness on feet: Secondary | ICD-10-CM

## 2022-12-19 DIAGNOSIS — R279 Unspecified lack of coordination: Secondary | ICD-10-CM

## 2022-12-19 DIAGNOSIS — R269 Unspecified abnormalities of gait and mobility: Secondary | ICD-10-CM | POA: Diagnosis not present

## 2022-12-19 DIAGNOSIS — R2689 Other abnormalities of gait and mobility: Secondary | ICD-10-CM | POA: Diagnosis not present

## 2022-12-19 NOTE — Therapy (Signed)
OUTPATIENT PHYSICAL THERAPY TREATMENT Physical Therapy Progress Note  Dates of reporting period  10/03/22  to  12/19/22   Patient Name: Tom Underwood MRN: [id number] DOB:1951-08-27, 72 y.o., male Today's Date: 12/19/2022  PCP: Myles Gip, DO REFERRING PROVIDER: Myles Gip, DO   PT End of Session - 12/19/22 1315     Visit Number 37 of 52   Date for PT Re-Evaluation 03/08/23    PT Start Time 1300 to 1355   55 minutes    Activity Tolerance Patient tolerated treatment well;Patient limited by fatigue    Behavior During Therapy [facility] for tasks assessed/performed             Past Medical History:  Diagnosis Date   Anxiety    Arthritis    Atrial fibrillation ([place])    a.) CHA2DS2-VASc = 4 (age, HTN, CVA x2). b.) s/p 200 J synchronized cardioversion (DCCV) on 12/08/2021. c.) rate/rythum maintained on oral metoprolol tartrate; chronically anticoagulated using rivaroxaban.   BPH (benign prostatic hyperplasia)    Complication of anesthesia    a.)  Intolerant of propofol; causes "skin burning and involuntary muscle twitching"   DDD (degenerative disc disease), lumbar    Decreased libido    Depression    Diverticulitis    Diverticulosis    GERD (gastroesophageal reflux disease)    Hepatic steatosis    History of 2019 novel coronavirus disease (COVID-19) 12/28/2020   History of cardiac murmur in childhood    History of kidney stones    HLD (hyperlipidemia)    HLD (hyperlipidemia)    Hypertension    Hypogonadism in male    Long term current use of anticoagulant    a.) rivaroxaban   OSA on CPAP    Pre-diabetes    Rhinitis, allergic    Stroke ([place]) 07/23/2019   a.) LEFT posterior cor radiata. b.) residual RIGHT sided weakness   Past Surgical History:  Procedure Laterality Date   CARDIOVERSION N/A 12/08/2021   Procedure: CARDIOVERSION;  Surgeon: Nelva Bush, MD;  Location: [facility] ORS;  Service: Cardiovascular;  Laterality: N/A;   COLONOSCOPY      EVALUATION UNDER ANESTHESIA WITH HEMORRHOIDECTOMY N/A 04/15/2022   Procedure: EXAM UNDER ANESTHESIA WITH HEMORRHOIDECTOMY;  Surgeon: Benjamine Sprague, DO;  Location: [facility] ORS;  Service: General;  Laterality: N/A;   EXTERNAL EAR SURGERY     EYE SURGERY     FINGER SURGERY Right    lateration to 5 th digit   KNEE SURGERY     left eye     RECTAL EXAM UNDER ANESTHESIA N/A 04/23/2022   Procedure: RECTAL EXAM UNDER ANESTHESIA WITH LIGATION OF BLEEDING;  Surgeon: Olean Ree, MD;  Location: [facility] ORS;  Service: General;  Laterality: N/A;   Patient Active Problem List   Diagnosis Date Noted   Gastroesophageal reflux disease without esophagitis 05/17/2022   Dysthymia 05/17/2022   History of syncope 05/17/2022   History of hemorrhoidectomy 05/17/2022   Vitamin D deficiency 05/17/2022   Lower extremity edema 05/17/2022   History of rectal bleeding 05/17/2022   Bleeding internal hemorrhoids    Morbid obesity ([place])    Persistent atrial fibrillation ([place]) 07/14/2020   Hypertension    Hemiparesis of right dominant side as late effect of cerebral infarction ([place]) 08/13/2019   History of kidney stones 05/02/2018   ED (erectile dysfunction) 05/02/2018   Hyperglycemia 01/30/2017   Arthritis of knee, degenerative 01/30/2017   Benign prostatic hyperplasia without lower urinary tract symptoms 02/03/2016   Dyslipidemia 08/04/2015  Diverticulitis of large intestine without perforation or abscess without bleeding 06/30/2015   OSA on CPAP 06/30/2015    REFERRING DIAG: M25.511,M25.512 (ICD-10-CM) - Bilateral shoulder pain, unspecified chronicity M17.2 (ICD-10-CM) - Post-traumatic osteoarthritis of both knees  R26.81 (ICD-10-CM) - Gait instability    THERAPY DIAG:  Difficulty in walking, not elsewhere classified  Muscle weakness (generalized)  Unsteadiness on feet  Abnormality of gait and mobility  Unspecified lack of coordination  PERTINENT HISTORY: See evaluation  PRECAUTIONS: Fall   10/2:   6MWT: 38 feet x 16 laps plus 12 feet (620 feet).  HR: 65 bpm/ O2 sat. 95%.   SUBJECTIVE:  12/19/22:  Pt. Reports pain is "13/10 today".  B knee/lower legs (L worse than R).  Pt. Reports feeling a little short of breath prior to tx. Session and needed rest break upon entry of the clinic. Pt. States compliance with HEP. Pt. Reports still having trouble with walking activities such as shopping with his wife due to an increase of pain with LE activity.  PAIN:  Are you having pain? Yes: NPRS scale: 10/10 Pain location: B knee Pain description: aching Aggravating factors: prolonged standing/ walking Relieving factors: rest   TODAY'S TREATMENT:   12/19/22:    Nustep L5 10+ min. B UE/LE.  Seat position #10/ arm position #10.  0.60 miles.  HR: 99x bpm.  O2 sat: 96%.  Increase generalized muscle fatigue with reports of SOB (vitals are good).    Walking in //-bars with alt. High knee UE/LE touches. Lateral walking L/R in //-bar with no UE assist (cuing to increase hip flexion and lumbar rotation).  3 laps each.  Seated rest break required.   Standing hip abd.& ext. In //-bars With 4 lb ankle weight 10 x 2  Nautilus: 50# standing lat. Pull downs with wand 30x.  50# standing tricep extension 30x.      PATIENT EDUCATION: Education details: HEP/ daily activity Person educated: Patient Education method: Customer service manager Education comprehension: verbalized understanding and returned demonstration   HOME EXERCISE PROGRAM: Handouts given for shoulder ROM/ strengthening/ standing hip ex.    PT Short Term Goals -  01/11/23      PT SHORT TERM GOAL #1   Title Pt able to complete 6MWT and ambulate >800 feet with no assistive device safely.     Baseline 10/2:  620 feet.  HR: 65 bpm/ O2 sat. 95%.   Time 4    Period Weeks    Status Not met              PT Long Term Goals -  03/08/2023      PT LONG TERM GOAL #1   Title Pt will improve FOTO score to 50 to display improvement in  functional mobility.    Baseline 6/7: 33.  10/23: 52   Time 8   Period Weeks    Status Goal met on 10/23   Target Date 12/05/2022     PT LONG TERM GOAL #2   Title Pt wil improve TUG score to < 12 sec with no AD to display significant imporvement in reduced risk of falls    Baseline 11/23: 26 sec (did not sit down immediately leading to higher time than he would've scored) 1/19: 14.44 sec.   6/7: 12.66 sec.    Time 8   Period Weeks    Status Partially Met    Target Date 03/08/2023     PT LONG TERM GOAL #3   Title Pt will  improve DGI to >19/24 to indicate decreased risk of falls with household and community walking tasks    Baseline 11/23: 11/24 1/19:12/24    Time 8    Period Weeks    Status Partially Met    Target Date 03/08/2023     PT LONG TERM GOAL #4   Title Pt. Will increase R hip flexion/ LE muscle strength 1/2 muscle grade to improve standing tolerance/ walking endurance.    Baseline See above   Time 8    Period Weeks    Status Partially Met    Target Date 03/08/2023           Plan -     Clinical Impression Statement Pt. Works hard during tx. Pt. Showed an increase in determination during today's visit. Session and requires several short seated rest breaks after there.ex. secondary to fatigue/ SOB. Pt. Benefits from light UE assist in //-bars for safety.  Pt. C/o persistent B knee/ lower leg discomfort and demonstrated an antalgic gait with decreased knee flexion. Pt. Was cued to increase hip flexion with walking and stand up tall. Pt.does benefit from assistive device due to fatigue/ LE swelling and weakness. Pt will continue to benefit from skilled PT services to progress strength and mobility, along with cardiovascular endurance.    Personal Factors and Comorbidities Comorbidity 2    Comorbidities Hypertension, Obesity    Examination-Activity Limitations Bed Mobility;Reach Overhead;Squat;Lift;Dressing;Hygiene/Grooming    Examination-Participation Restrictions Community  Activity;Yard Work    Conservation officer, historic buildings Evolving/Moderate complexity    Clinical Decision Making Moderate    Rehab Potential Fair    PT Frequency 1x / week    PT Duration 12 weeks    PT Treatment/Interventions ADLs/Self Care Home Management;Gait training;Stair training;Functional mobility training;Therapeutic activities;Therapeutic exercise;Balance training;Neuromuscular re-education;Manual techniques;Patient/family education;Electrical Stimulation;Moist Heat;Cryotherapy    PT Next Visit Plan Progress endurance training to improve functional activity. Add sit to stand activity.      PT Home Exercise Plan Shoulder pulleys in flex, scaption, abd, pendelums, scap retractions with resistance (RTB), D2 flexion pattern in sitting no resistance.    Consulted and Agree with Plan of Care Patient         Everlene Other, SPT Cammie Mcgee, PT, DPT # [phone] 12/19/2022, 4:02 PM

## 2022-12-23 ENCOUNTER — Ambulatory Visit: Payer: Medicare Other | Attending: Cardiology | Admitting: Cardiology

## 2022-12-23 ENCOUNTER — Encounter: Payer: Self-pay | Admitting: Cardiology

## 2022-12-23 VITALS — BP 112/78 | HR 95 | Ht 70.0 in | Wt 303.0 lb

## 2022-12-23 DIAGNOSIS — I10 Essential (primary) hypertension: Secondary | ICD-10-CM | POA: Insufficient documentation

## 2022-12-23 DIAGNOSIS — I4819 Other persistent atrial fibrillation: Secondary | ICD-10-CM

## 2022-12-23 DIAGNOSIS — E7800 Pure hypercholesterolemia, unspecified: Secondary | ICD-10-CM | POA: Insufficient documentation

## 2022-12-23 MED ORDER — VALSARTAN 80 MG PO TABS: 80.0000 mg | ORAL_TABLET | Freq: Every day | ORAL | 2 refills | Status: DC

## 2022-12-23 MED ORDER — FUROSEMIDE 20 MG PO TABS: 20.0000 mg | ORAL_TABLET | ORAL | 2 refills | Status: DC | PRN

## 2022-12-23 MED ORDER — METOPROLOL TARTRATE 25 MG PO TABS
25.0000 mg | ORAL_TABLET | Freq: Two times a day (BID) | ORAL | 3 refills | Status: DC
Start: 2022-12-23 — End: 2023-09-20

## 2022-12-23 NOTE — Progress Notes (Signed)
Cardiology Office Note:    Date:  12/23/2022   ID:  Tom Underwood, DOB 08-Sep-1951, MRN [id number]  PCP:  Tom Gip, DO  [facility] Cardiologist:  Tom Sable, MD  [facility] Electrophysiologist:  Tom Epley, MD   Referring MD: Tom Sizer, MD   Chief Complaint  Patient presents with   Follow-up    3 month follow up, question about Valsartan     History of Present Illness:    Tom Underwood is a 72 y.o. male with a hx of persistent A-fib (s/p DCCV 12/2021), hypertension, hyperlipidemia, CVA 07/2019 with right-sided weakness, OSA on CPAP presents for follow-up.  Being seen for persistent A-fib, underwent DC cardioversion, maintaining sinus rhythm.  Xarelto for stroke prophylaxis stopped due to bleeding issues.  Bleeding hemorrhoids s/p resection performed.  Has not been on Xarelto since.  Denies any bleeding issues being on Xarelto.  Evaluated by EP, Watchman being considered, patient wants to think a little more prior to proceeding with procedure.  Prior notes Echocardiogram 7/32/2025 normal systolic function, EF 55 to 60%.  Normal diastolic function. Previously evaluated by EP, not a candidate for ablation due to OSA, obesity. Hemorrhoids, GI bleeding, Xarelto stopped  Past Medical History:  Diagnosis Date   Anxiety    Arthritis    Atrial fibrillation ([place])    a.) CHA2DS2-VASc = 4 (age, HTN, CVA x2). b.) s/p 200 J synchronized cardioversion (DCCV) on 12/08/2021. c.) rate/rythum maintained on oral metoprolol tartrate; chronically anticoagulated using rivaroxaban.   BPH (benign prostatic hyperplasia)    Complication of anesthesia    a.)  Intolerant of propofol; causes "skin burning and involuntary muscle twitching"   DDD (degenerative disc disease), lumbar    Decreased libido    Depression    Diverticulitis    Diverticulosis    GERD (gastroesophageal reflux disease)    Hepatic steatosis    History of 2019 novel coronavirus disease  (COVID-19) 12/28/2020   History of cardiac murmur in childhood    History of kidney stones    HLD (hyperlipidemia)    HLD (hyperlipidemia)    Hypertension    Hypogonadism in male    Long term current use of anticoagulant    a.) rivaroxaban   OSA on CPAP    Pre-diabetes    Rhinitis, allergic    Stroke ([place]) 07/23/2019   a.) LEFT posterior cor radiata. b.) residual RIGHT sided weakness    Past Surgical History:  Procedure Laterality Date   CARDIOVERSION N/A 12/08/2021   Procedure: CARDIOVERSION;  Surgeon: Tom Bush, MD;  Location: [facility] ORS;  Service: Cardiovascular;  Laterality: N/A;   COLONOSCOPY     EVALUATION UNDER ANESTHESIA WITH HEMORRHOIDECTOMY N/A 04/15/2022   Procedure: EXAM UNDER ANESTHESIA WITH HEMORRHOIDECTOMY;  Surgeon: Tom Sprague, DO;  Location: [facility] ORS;  Service: General;  Laterality: N/A;   EXTERNAL EAR SURGERY     EYE SURGERY     FINGER SURGERY Right    lateration to 5 th digit   KNEE SURGERY     left eye     RECTAL EXAM UNDER ANESTHESIA N/A 04/23/2022   Procedure: RECTAL EXAM UNDER ANESTHESIA WITH LIGATION OF BLEEDING;  Surgeon: Tom Ree, MD;  Location: [facility] ORS;  Service: General;  Laterality: N/A;    Current Medications: Current Meds  Medication Sig   amitriptyline (ELAVIL) 25 MG tablet TAKE 1 TABLET BY MOUTH AT BEDTIME AS NEEDED FOR SLEEP   buPROPion (WELLBUTRIN XL) 150 MG 24 hr tablet Take 1 tablet (  150 mg total) by mouth every morning.   busPIRone (BUSPAR) 10 MG tablet Take 1 tablet (10 mg total) by mouth 2 (two) times daily.   Cholecalciferol (VITAMIN D) 125 MCG (5000 UT) CAPS Take 5,000 Units by mouth daily.   fluticasone (FLONASE) 50 MCG/ACT nasal spray Place 2 sprays into both nostrils daily.   GLUCOSAMINE-CHONDROITIN PO Take 2 tablets by mouth in the morning and at bedtime.   lidocaine (XYLOCAINE) 5 % ointment Apply 1 application. topically 3 (three) times daily as needed. Apply pea size amount to area as needed   methocarbamol  (ROBAXIN) 500 MG tablet Take 500 mg by mouth every 8 (eight) hours as needed for muscle spasms.   Multiple Vitamin (MULTIVITAMIN) tablet Take 1 tablet by mouth daily.   omega-3 acid ethyl esters (LOVAZA) 1 g capsule Take 2 capsules (2 g total) by mouth 2 (two) times daily.   pantoprazole (PROTONIX) 20 MG tablet Take 1 tablet (20 mg total) by mouth daily.   rosuvastatin (CRESTOR) 20 MG tablet Take 1 tablet (20 mg total) by mouth daily.   Saccharomyces boulardii (PROBIOTIC) 250 MG CAPS Take 1 capsule by mouth daily.   sildenafil (VIAGRA) 50 MG tablet    tamsulosin (FLOMAX) 0.4 MG CAPS capsule Take 1 capsule (0.4 mg total) by mouth daily.   VISINE DRY EYE RELIEF 1 % SOLN Place 1 drop into both eyes daily as needed (dry eyes).   [DISCONTINUED] furosemide (LASIX) 20 MG tablet Take 1 tablet (20 mg total) by mouth as needed. For lower extremity swelling.   [DISCONTINUED] metoprolol tartrate (LOPRESSOR) 50 MG tablet Take 0.5 tablets (25 mg total) by mouth 2 (two) times daily.   [DISCONTINUED] valsartan (DIOVAN) 80 MG tablet Take 80 mg by mouth daily.     Allergies:   Penicillins   Social History   Socioeconomic History   Marital status: Married    Spouse name: Tom Underwood    Number of children: 2   Years of education: Not on file   Highest education level: Not on file  Occupational History   Occupation: retired   Tobacco Use   Smoking status: Former    Types: Pipe    Start date: 02/20/1973    Quit date: 02/20/1974    Years since quitting: 48.8   Smokeless tobacco: Never  Vaping Use   Vaping Use: Never used  Substance and Sexual Activity   Alcohol use: No    Alcohol/week: 0.0 standard drinks of alcohol   Drug use: No   Sexual activity: Yes    Partners: Female  Other Topics Concern   Not on file  Social History Narrative   Not on file   Social Determinants of Health   Financial Resource Strain: Low Risk  (05/17/2022)   Overall Financial Resource Strain (CARDIA)    Difficulty of Paying  Living Expenses: Not hard at all  Food Insecurity: No Food Insecurity (05/17/2022)   Hunger Vital Sign    Worried About Running Out of Food in the Last Year: Never true    Ran Out of Food in the Last Year: Never true  Transportation Needs: No Transportation Needs (05/17/2022)   PRAPARE - Administrator, Civil Service (Medical): No    Lack of Transportation (Non-Medical): No  Physical Activity: Insufficiently Active (05/17/2022)   Exercise Vital Sign    Days of Exercise per Week: 1 day    Minutes of Exercise per Session: 60 min  Stress: No Stress Concern Present (05/17/2022)  [place] Questionnaire    Feeling of Stress : Not at all  Social Connections: Socially Integrated (05/17/2022)   Social Connection and Isolation Panel [NHANES]    Frequency of Communication with Friends and Family: More than three times a week    Frequency of Social Gatherings with Friends and Family: Twice a week    Attends Religious Services: More than 4 times per year    Active Member of [employer] or Organizations: Yes    Attends Music therapist: More than 4 times per year    Marital Status: Married     Family History: The patient's family history includes Asthma in his father and mother; Dementia in his mother; Heart attack in his mother; Heart disease in his father and mother; Stroke in his father.  ROS:   Please see the history of present illness.     All other systems reviewed and are negative.  EKGs/Labs/Other Studies Reviewed:    The following studies were reviewed today:   EKG:  EKG is ordered today.  EKG shows sinus bradycardia, heart rate 54  Recent Labs: 04/23/2022: ALT 20 04/24/2022: BUN 18; Magnesium 2.1; Potassium 4.4; Sodium 141 07/01/2022: Hemoglobin 14.4; Platelets 219 09/02/2022: Creatinine, Ser 1.10  Recent Lipid Panel    Component Value Date/Time   CHOL 116 07/01/2022 1118   TRIG 59 07/01/2022 1118   HDL 48  07/01/2022 1118   CHOLHDL 2.4 07/01/2022 1118   LDLCALC 55 07/01/2022 1118     Risk Assessment/Calculations:      Physical Exam:    VS:  BP 112/78 (BP Location: Right Arm, Patient Position: Sitting, Cuff Size: Large)   Pulse 95   Ht 5\' 10"  (1.778 m)   Wt (!) 303 lb (137.4 kg)   SpO2 95%   BMI 43.48 kg/m     Wt Readings from Last 3 Encounters:  12/23/22 (!) 303 lb (137.4 kg)  11/15/22 (!) 303 lb 1.6 oz (137.5 kg)  09/07/22 300 lb (136.1 kg)     GEN:  Well nourished, well developed in no acute distress HEENT: Normal NECK: No JVD; No carotid bruits CARDIAC: Regular rate and rhythm, no murmurs RESPIRATORY:  Clear to auscultation without rales, wheezing or rhonchi  ABDOMEN: Soft, non-tender, non-distended MUSCULOSKELETAL: Trace edema SKIN: Warm and dry NEUROLOGIC:  Alert and oriented x 3 PSYCHIATRIC:  Normal affect   ASSESSMENT:    1. Persistent atrial fibrillation ([place])   2. Primary hypertension   3. Pure hypercholesterolemia    PLAN:    In order of problems listed above:  Atrial fibrillation, persistent, s/p DCCV 12/2021.  Maintaining sinus rhythm.  CHA2DS2-VASc score 4 (Age, htn, CVA).  Continue Lopressor 25 mg twice daily, bleeding issues with Xarelto, hemorrhoids s/p surgical repair.  No further bleeding noted being off NOAC.  Being considered for watchman when patient decides. hypertension, BP controlled.  Continue Lopressor 25 mg twice daily, valsartan 80. hyperlipidemia, cholesterol controlled, continue Crestor 20 mg daily, lovaza.  Follow-up in 6 months.     Medication Adjustments/Labs and Tests Ordered: Current medicines are reviewed at length with the patient today.  Concerns regarding medicines are outlined above.  Orders Placed This Encounter  Procedures   EKG 12-Lead    Meds ordered this encounter  Medications   furosemide (LASIX) 20 MG tablet    Sig: Take 1 tablet (20 mg total) by mouth as needed. For lower extremity swelling.    Dispense:   90 tablet  Refill:  2   valsartan (DIOVAN) 80 MG tablet    Sig: Take 1 tablet (80 mg total) by mouth daily.    Dispense:  90 tablet    Refill:  2   metoprolol tartrate (LOPRESSOR) 25 MG tablet    Sig: Take 1 tablet (25 mg total) by mouth 2 (two) times daily.    Dispense:  180 tablet    Refill:  3     Patient Instructions  Medication Instructions:   Your physician recommends that you continue on your current medications as directed. Please refer to the Current Medication list given to you today. 2. I have updated your metoprolol prescription so you do not have to keep cutting it.   - the prescription will read -  Take one tablet (25 mg) by mouth twice a day.   *If you need a refill on your cardiac medications before your next appointment, please call your pharmacy*   Lab Work:  None Ordered  If you have labs (blood work) drawn today and your tests are completely normal, you will receive your results only by: MyChart Message (if you have MyChart) OR A paper copy in the mail If you have any lab test that is abnormal or we need to change your treatment, we will call you to review the results.   Testing/Procedures:  None Ordered   Follow-Up: At [facility], you and your health needs are our priority.  As part of our continuing mission to provide you with exceptional heart care, we have created designated Provider Care Teams.  These Care Teams include your primary Cardiologist (physician) and Advanced Practice Providers (APPs -  Physician Assistants and Nurse Practitioners) who all work together to provide you with the care you need, when you need it.  We recommend signing up for the patient portal called "MyChart".  Sign up information is provided on this After Visit Summary.  MyChart is used to connect with patients for Virtual Visits (Telemedicine).  Patients are able to view lab/test results, encounter notes, upcoming appointments, etc.  Non-urgent messages can be  sent to your provider as well.   To learn more about what you can do with MyChart, go to [email or web address].    Your next appointment:   6 month(s)  Provider:   You may see Debbe Odea, MD or one of the following Advanced Practice Providers on your designated Care Team:   Nicolasa Ducking, NP Eula Listen, PA-C Cadence Fransico [name], PA-C Charlsie Quest, NP    Signed, Debbe Odea, MD  12/23/2022 11:37 AM    [facility]

## 2022-12-23 NOTE — Patient Instructions (Signed)
Medication Instructions:   Your physician recommends that you continue on your current medications as directed. Please refer to the Current Medication list given to you today. 2. I have updated your metoprolol prescription so you do not have to keep cutting it.   - the prescription will read -  Take one tablet (25 mg) by mouth twice a day.   *If you need a refill on your cardiac medications before your next appointment, please call your pharmacy*   Lab Work:  None Ordered  If you have labs (blood work) drawn today and your tests are completely normal, you will receive your results only by: [place] (if you have MyChart) OR A paper copy in the mail If you have any lab test that is abnormal or we need to change your treatment, we will call you to review the results.   Testing/Procedures:  None Ordered   Follow-Up: At [facility], you and your health needs are our priority.  As part of our continuing mission to provide you with exceptional heart care, we have created designated Provider Care Teams.  These Care Teams include your primary Cardiologist (physician) and Advanced Practice Providers (APPs -  Physician Assistants and Nurse Practitioners) who all work together to provide you with the care you need, when you need it.  We recommend signing up for the patient portal called "MyChart".  Sign up information is provided on this After Visit Summary.  MyChart is used to connect with patients for Virtual Visits (Telemedicine).  Patients are able to view lab/test results, encounter notes, upcoming appointments, etc.  Non-urgent messages can be sent to your provider as well.   To learn more about what you can do with MyChart, go to [email or web address].    Your next appointment:   6 month(s)  Provider:   You may see Kate Sable, MD or one of the following Advanced Practice Providers on your designated Care Team:   Murray Hodgkins, NP Christell Faith, PA-C Cadence  Kathlen Mody, PA-C Gerrie Nordmann, NP

## 2022-12-26 ENCOUNTER — Ambulatory Visit: Payer: Medicare Other | Admitting: Physical Therapy

## 2022-12-26 DIAGNOSIS — R279 Unspecified lack of coordination: Secondary | ICD-10-CM

## 2022-12-26 DIAGNOSIS — R269 Unspecified abnormalities of gait and mobility: Secondary | ICD-10-CM

## 2022-12-26 DIAGNOSIS — M6281 Muscle weakness (generalized): Secondary | ICD-10-CM | POA: Diagnosis not present

## 2022-12-26 DIAGNOSIS — R2681 Unsteadiness on feet: Secondary | ICD-10-CM

## 2022-12-26 DIAGNOSIS — R262 Difficulty in walking, not elsewhere classified: Secondary | ICD-10-CM | POA: Diagnosis not present

## 2022-12-26 DIAGNOSIS — R2689 Other abnormalities of gait and mobility: Secondary | ICD-10-CM | POA: Diagnosis not present

## 2022-12-26 NOTE — Therapy (Signed)
OUTPATIENT PHYSICAL THERAPY TREATMENT   Patient Name: Tom Underwood MRN: [id number] DOB:05-Feb-1951, 72 y.o., male Today's Date: 12/27/2022   PCP: Steele Sizer, MD REFERRING PROVIDER: Myles Gip, DO   PT End of Session - 12/26/22 1258     Visit Number 31    Number of Visits 42    Date for PT Re-Evaluation 03/08/23    PT Start Time 1027    PT Stop Time 1351    PT Time Calculation (min) 53 min    Equipment Utilized During Treatment Gait belt    Activity Tolerance Patient tolerated treatment well;Patient limited by pain;Patient limited by fatigue    Behavior During Therapy [facility] for tasks assessed/performed               Past Medical History:  Diagnosis Date   Anxiety    Arthritis    Atrial fibrillation ([place])    a.) CHA2DS2-VASc = 4 (age, HTN, CVA x2). b.) s/p 200 J synchronized cardioversion (DCCV) on 12/08/2021. c.) rate/rythum maintained on oral metoprolol tartrate; chronically anticoagulated using rivaroxaban.   BPH (benign prostatic hyperplasia)    Complication of anesthesia    a.)  Intolerant of propofol; causes "skin burning and involuntary muscle twitching"   DDD (degenerative disc disease), lumbar    Decreased libido    Depression    Diverticulitis    Diverticulosis    GERD (gastroesophageal reflux disease)    Hepatic steatosis    History of 2019 novel coronavirus disease (COVID-19) 12/28/2020   History of cardiac murmur in childhood    History of kidney stones    HLD (hyperlipidemia)    HLD (hyperlipidemia)    Hypertension    Hypogonadism in male    Long term current use of anticoagulant    a.) rivaroxaban   OSA on CPAP    Pre-diabetes    Rhinitis, allergic    Stroke ([place]) 07/23/2019   a.) LEFT posterior cor radiata. b.) residual RIGHT sided weakness   Past Surgical History:  Procedure Laterality Date   CARDIOVERSION N/A 12/08/2021   Procedure: CARDIOVERSION;  Surgeon: Nelva Bush, MD;  Location: [facility] ORS;  Service:  Cardiovascular;  Laterality: N/A;   COLONOSCOPY     EVALUATION UNDER ANESTHESIA WITH HEMORRHOIDECTOMY N/A 04/15/2022   Procedure: EXAM UNDER ANESTHESIA WITH HEMORRHOIDECTOMY;  Surgeon: Benjamine Sprague, DO;  Location: [facility] ORS;  Service: General;  Laterality: N/A;   EXTERNAL EAR SURGERY     EYE SURGERY     FINGER SURGERY Right    lateration to 5 th digit   KNEE SURGERY     left eye     RECTAL EXAM UNDER ANESTHESIA N/A 04/23/2022   Procedure: RECTAL EXAM UNDER ANESTHESIA WITH LIGATION OF BLEEDING;  Surgeon: Olean Ree, MD;  Location: [facility] ORS;  Service: General;  Laterality: N/A;   Patient Active Problem List   Diagnosis Date Noted   Gastroesophageal reflux disease without esophagitis 05/17/2022   Dysthymia 05/17/2022   History of syncope 05/17/2022   History of hemorrhoidectomy 05/17/2022   Vitamin D deficiency 05/17/2022   Lower extremity edema 05/17/2022   History of rectal bleeding 05/17/2022   Bleeding internal hemorrhoids    Morbid obesity ([place])    Persistent atrial fibrillation ([place]) 07/14/2020   Hypertension    Hemiparesis of right dominant side as late effect of cerebral infarction ([place]) 08/13/2019   History of kidney stones 05/02/2018   ED (erectile dysfunction) 05/02/2018   Hyperglycemia 01/30/2017   Arthritis of knee, degenerative 01/30/2017  Benign prostatic hyperplasia without lower urinary tract symptoms 02/03/2016   Dyslipidemia 08/04/2015   Diverticulitis of large intestine without perforation or abscess without bleeding 06/30/2015   OSA on CPAP 06/30/2015    REFERRING DIAG: M25.511,M25.512 (ICD-10-CM) - Bilateral shoulder pain, unspecified chronicity M17.2 (ICD-10-CM) - Post-traumatic osteoarthritis of both knees  R26.81 (ICD-10-CM) - Gait instability    THERAPY DIAG:  Muscle weakness (generalized)  Unsteadiness on feet  Abnormality of gait and mobility  Unspecified lack of coordination  PERTINENT HISTORY: See evaluation  PRECAUTIONS:  Fall   10/2:  : 38 feet x 16 laps plus 12 feet (620 feet).  HR: 65 bpm/ O2 sat. 95%.   SUBJECTIVE:  12/26/22:  Pt. Reports B knee pain is "11/10 today".  Reports pain is better than previous treatment session. Pt. Reports when lying down with legs elevated pain goes down to its lowest 3/10 NPS. Anytime pt. Is active B knees/lower legs (L worse than R) cause discomfort . Pt. needed rest break upon entry of the clinic. Pt. States compliance with HEP. Pt. Reports still having trouble with walking activities such as shopping with his wife due to an increase of pain with LE activity.   PAIN:  Are you having pain? Yes: NPRS scale: 11/10 Pain location: B knee Pain description: aching Aggravating factors: prolonged standing/ walking Relieving factors: rest   TODAY'S TREATMENT:   12/26/22:    VITALS: BP-139/69 58 bpm pre-treatment.    Nustep L5 10+ min. B UE/LE.  Seat position #10/ arm position #10.  0.58 miles.  HR: 93 bpm.  O2 sat: 94%.  Increase generalized muscle fatigue with reports of SOB (vitals are WNL).    6" step ups alternating leading leg using handles for UE support 2 x 10. Pt. Required rest break between sets.   TRX squats with clinic chair directly behind pt. For squat depth cue and support/rest breaks if needed. 2 x 15    Supine TA activation with light marching 2 x 10. Cuing to brace core and use slow controlled movements.    Walking in clinic with gait reassessment/ discussion for home walking program on a daily basis.     PATIENT EDUCATION: Education details: HEP/ daily activity Person educated: Patient Education method: Medical illustrator Education comprehension: verbalized understanding and returned demonstration   HOME EXERCISE PROGRAM: Handouts given for shoulder ROM/ strengthening/ standing hip ex.    PT Short Term Goals -  01/11/23      PT SHORT TERM GOAL #1   Title Pt able to complete and ambulate >800 feet with no assistive device  safely.     Baseline 10/2:  620 feet.  HR: 65 bpm/ O2 sat. 95%.   Time 4    Period Weeks    Status Not met              PT Long Term Goals -  03/08/2023      PT LONG TERM GOAL #1   Title Pt will improve FOTO score to 50 to display improvement in functional mobility.    Baseline 6/7: 33.  10/23: 52   Time 8   Period Weeks    Status Goal met on 10/23   Target Date 12/05/2022     PT LONG TERM GOAL #2   Title Pt wil improve TUG score to < 12 sec with no AD to display significant imporvement in reduced risk of falls    Baseline 11/23: 26 sec (did not sit down immediately leading  to higher time than he would've scored) 1/19: 14.44 sec.   6/7: 12.66 sec.    Time 8   Period Weeks    Status Partially Met    Target Date 03/08/2023     PT LONG TERM GOAL #3   Title Pt will improve DGI to >19/24 to indicate decreased risk of falls with household and community walking tasks    Baseline 11/23: 11/24 1/19:12/24    Time 8    Period Weeks    Status Partially Met    Target Date 03/08/2023     PT LONG TERM GOAL #4   Title Pt. Will increase R hip flexion/ LE muscle strength 1/2 muscle grade to improve standing tolerance/ walking endurance.    Baseline See above   Time 8    Period Weeks    Status Partially Met    Target Date 03/08/2023           Plan -     Clinical Impression Statement Pt. Works hard during tx. Session and requires several short seated rest breaks after there.ex. secondary to fatigue/ SOB. Pt. Benefits from mod. UE assist from hand rails and CGA with gait belt during stair stepping without SPC. Pt. Cued to brace core during there ex. activities in order to support his hips and back and avoid compensation due to aggressive knee pain. Pt. C/o persistent B knee/ lower leg discomfort and demonstrated an antalgic gait with decreased knee flexion. Pt. Was cued to increase hip flexion with walking and stand up tall. Pt.does benefit from assistive device due to fatigue/ LE swelling and  weakness. Pt will continue to benefit from skilled PT services to progress strength and mobility, along with cardiovascular endurance.    Personal Factors and Comorbidities Comorbidity 2    Comorbidities Hypertension, Obesity    Examination-Activity Limitations Bed Mobility;Reach Overhead;Squat;Lift;Dressing;Hygiene/Grooming    Examination-Participation Restrictions Community Activity;Yard Work    Merchant [employer] officer Evolving/Moderate complexity    Clinical Decision Making Moderate    Rehab Potential Fair    PT Frequency 1x / week    PT Duration 12 weeks    PT Treatment/Interventions ADLs/Self Care Home Management;Gait training;Stair training;Functional mobility training;Therapeutic activities;Therapeutic exercise;Balance training;Neuromuscular re-education;Manual techniques;Patient/family education;Electrical Stimulation;Moist Heat;Cryotherapy    PT Ne     PT Home Exercise Plan Progress endurance training to improve functional activity. Add sit to stand activity.      Consulted and Agree with Plan of Care Patient         Rod Holler, SPT Pura Spice, PT, DPT # [phone] 12/26/2022, 5:23 PM

## 2023-01-02 ENCOUNTER — Ambulatory Visit: Payer: Medicare Other | Admitting: Physical Therapy

## 2023-01-02 DIAGNOSIS — R2681 Unsteadiness on feet: Secondary | ICD-10-CM | POA: Diagnosis not present

## 2023-01-02 DIAGNOSIS — R2689 Other abnormalities of gait and mobility: Secondary | ICD-10-CM | POA: Diagnosis not present

## 2023-01-02 DIAGNOSIS — R269 Unspecified abnormalities of gait and mobility: Secondary | ICD-10-CM

## 2023-01-02 DIAGNOSIS — M6281 Muscle weakness (generalized): Secondary | ICD-10-CM

## 2023-01-02 DIAGNOSIS — R279 Unspecified lack of coordination: Secondary | ICD-10-CM

## 2023-01-02 DIAGNOSIS — R262 Difficulty in walking, not elsewhere classified: Secondary | ICD-10-CM | POA: Diagnosis not present

## 2023-01-02 NOTE — Therapy (Signed)
OUTPATIENT PHYSICAL THERAPY TREATMENT   Patient Name: Tom Underwood MRN: [id number] DOB:05-Apr-1951, 72 y.o., male Today's Date: 01/02/2023   PCP: Alba Cory, MD REFERRING PROVIDER: Caro Laroche, DO   PT End of Session - 01/02/23 1301     Visit Number 32    Number of Visits 42    Date for PT Re-Evaluation 03/08/23    PT Start Time 1302    PT Stop Time 1353    PT Time Calculation (min) 51 min    Equipment Utilized During Treatment Other (comment)    Activity Tolerance Patient tolerated treatment well;Patient limited by fatigue    Behavior During Therapy WFL for tasks assessed/performed               Past Medical History:  Diagnosis Date   Anxiety    Arthritis    Atrial fibrillation ([facility])    a.) CHA2DS2-VASc = 4 (age, HTN, CVA x2). b.) s/p 200 J synchronized cardioversion (DCCV) on 12/08/2021. c.) rate/rythum maintained on oral metoprolol tartrate; chronically anticoagulated using rivaroxaban.   BPH (benign prostatic hyperplasia)    Complication of anesthesia    a.)  Intolerant of propofol; causes "skin burning and involuntary muscle twitching"   DDD (degenerative disc disease), lumbar    Decreased libido    Depression    Diverticulitis    Diverticulosis    GERD (gastroesophageal reflux disease)    Hepatic steatosis    History of 2019 novel coronavirus disease (COVID-19) 12/28/2020   History of cardiac murmur in childhood    History of kidney stones    HLD (hyperlipidemia)    HLD (hyperlipidemia)    Hypertension    Hypogonadism in male    Long term current use of anticoagulant    a.) rivaroxaban   OSA on CPAP    Pre-diabetes    Rhinitis, allergic    Stroke ([facility]) 07/23/2019   a.) LEFT posterior cor radiata. b.) residual RIGHT sided weakness   Past Surgical History:  Procedure Laterality Date   CARDIOVERSION N/A 12/08/2021   Procedure: CARDIOVERSION;  Surgeon: Yvonne Kendall, MD;  Location: [facility] OR[facility];  Service: Cardiovascular;  Laterality:  N/A;   COLONOSCOPY     EVALUATION UNDER ANESTHESIA WITH HEMORRHOIDECTOMY N/A 04/15/2022   Procedure: EXAM UNDER ANESTHESIA WITH HEMORRHOIDECTOMY;  Surgeon: Sung Amabile, DO;  Location: [facility];  Service: General;  Laterality: N/A;   EXTERNAL EAR SURGERY     EYE SURGERY     FINGER SURGERY Right    lateration to 5 th digit   KNEE SURGERY     left eye     RECTAL EXAM UNDER ANESTHESIA N/A 04/23/2022   Procedure: RECTAL EXAM UNDER ANESTHESIA WITH LIGATION OF BLEEDING;  Surgeon: Henrene Dodge, MD;  Location: [facility];  Service: General;  Laterality: N/A;   Patient Active Problem List   Diagnosis Date Noted   Gastroesophageal reflux disease without esophagitis 05/17/2022   Dysthymia 05/17/2022   History of syncope 05/17/2022   History of hemorrhoidectomy 05/17/2022   Vitamin D deficiency 05/17/2022   Lower extremity edema 05/17/2022   History of rectal bleeding 05/17/2022   Bleeding internal hemorrhoids    Morbid obesity ([facility])    Persistent atrial fibrillation ([facility]) 07/14/2020   Hypertension    Hemiparesis of right dominant side as late effect of cerebral infarction ([facility]) 08/13/2019   History of kidney stones 05/02/2018   ED (erectile dysfunction) 05/02/2018   Hyperglycemia 01/30/2017   Arthritis of knee, degenerative 01/30/2017   Benign prostatic hyperplasia  without lower urinary tract symptoms 02/03/2016   Dyslipidemia 08/04/2015   Diverticulitis of large intestine without perforation or abscess without bleeding 06/30/2015   OSA on CPAP 06/30/2015    REFERRING DIAG: M25.511,M25.512 (ICD-10-CM) - Bilateral shoulder pain, unspecified chronicity M17.2 (ICD-10-CM) - Post-traumatic osteoarthritis of both knees  R26.81 (ICD-10-CM) - Gait instability    THERAPY DIAG:  Muscle weakness (generalized)  Unsteadiness on feet  Abnormality of gait and mobility  Unspecified lack of coordination  PERTINENT HISTORY: See evaluation  PRECAUTIONS: Fall   10/2:  6MWT: 38 feet x 16 laps  plus 12 feet (620 feet).  HR: 65 bpm/ O2 sat. 95%.   SUBJECTIVE:  01/02/23:  Pt. Reports B knee pain is "8/10 on the L and 7/10 on the R today". Pt. Reports, "his legs feel heavy". Pt. Reports increased fatigue today compared to normal. Pt. Reports some soreness and fatigue during chores over the weekend that required standing.   PAIN:  Are you having pain? Yes: NPRS scale: R 7/10 L 8/10 Pain location: B knee Pain description: aching Aggravating factors: prolonged standing/ walking Relieving factors: rest   TODAY'S TREATMENT:   01/02/23:    Nustep L5 10+ min. B UE/LE.  Seat position #10/ arm position #10.  0.56 miles.  HR: 95 bpm.  O2 sat: 94%.  Increase generalized muscle fatigue with reports of SOB (vitals are WNL).     6" heel taps alternating leading leg using handles for mod.UE support 3x12 reps for strength and endurance.  Sitting ball squeezes 2x15 reps with 3 sec. Holds. Pt. Cued to simultaneously contract TrA.   TRX squats with clinic chair directly behind pt. For squat depth cue and support/rest breaks if needed. 2 x 15    Discussion for home walking program around [place] on a daily basis.     PATIENT EDUCATION: Education details: HEP/ daily activity Person educated: Patient Education method: Customer service manager Education comprehension: verbalized understanding and returned demonstration   HOME EXERCISE PROGRAM: Handouts given for shoulder ROM/ strengthening/ standing hip ex.    PT Short Term Goals -  01/11/23      PT SHORT TERM GOAL #1   Title Pt able to complete 6MWT and ambulate >800 feet with no assistive device safely.     Baseline 10/2:  620 feet.  HR: 65 bpm/ O2 sat. 95%.   Time 4    Period Weeks    Status Not met              PT Long Term Goals -  03/08/2023      PT LONG TERM GOAL #1   Title Pt will improve FOTO score to 50 to display improvement in functional mobility.    Baseline 6/7: 33.  10/23: 52   Time 8   Period Weeks     Status Goal met on 10/23   Target Date 12/05/2022     PT LONG TERM GOAL #2   Title Pt wil improve TUG score to < 12 sec with no AD to display significant imporvement in reduced risk of falls    Baseline 11/23: 26 sec (did not sit down immediately leading to higher time than he would've scored) 1/19: 14.44 sec.   6/7: 12.66 sec.    Time 8   Period Weeks    Status Partially Met    Target Date 03/08/2023     PT LONG TERM GOAL #3   Title Pt will improve DGI to >19/24 to indicate decreased  risk of falls with household and community walking tasks    Baseline 11/23: 11/24 1/19:12/24    Time 8    Period Weeks    Status Partially Met    Target Date 03/08/2023     PT LONG TERM GOAL #4   Title Pt. Will increase R hip flexion/ LE muscle strength 1/2 muscle grade to improve standing tolerance/ walking endurance.    Baseline See above   Time 8    Period Weeks    Status Partially Met    Target Date 03/08/2023           Plan -     Clinical Impression Statement Pt. Arrived to PT with a stiff/antalgic gait using a SPC. Pt. Works hard during tx. PT. Cued to contract TrA during treatment to take pressure off spine and increase core strength. Pt. Cued to stand up tall to open up chest and promote a healthy breathing pattern. PT educated pt. On starting a home walking program. PT discussed having strategic places for rest breaks during walking at home for safety. Pt. Required frequent rest breaks due to SOB/fatigue.  Pt will continue to benefit from skilled PT services to progress strength and mobility, along with cardiovascular endurance.    Personal Factors and Comorbidities Comorbidity 2    Comorbidities Hypertension, Obesity    Examination-Activity Limitations Bed Mobility;Reach Overhead;Squat;Lift;Dressing;Hygiene/Grooming    Examination-Participation Restrictions Community Activity;Yard Work    Merchant [employer] officer Evolving/Moderate complexity    Clinical Decision Making Moderate     Rehab Potential Fair    PT Frequency 1x / week    PT Duration 12 weeks    PT Treatment/Interventions ADLs/Self Care Home Management;Gait training;Stair training;Functional mobility training;Therapeutic activities;Therapeutic exercise;Balance training;Neuromuscular re-education;Manual techniques;Patient/family education;Electrical Stimulation;Moist Heat;Cryotherapy    PT POC Progress LE strengthening/ gait   PT Home Exercise Plan Progress endurance training to improve functional activity. Add sit to stand activity.      Consulted and Agree with Plan of Care Patient         Rod Holler, SPT Pura Spice, PT, DPT # [phone] 01/02/2023, 1:53 PM

## 2023-01-09 ENCOUNTER — Ambulatory Visit: Payer: Medicare Other | Attending: Family Medicine | Admitting: Physical Therapy

## 2023-01-09 DIAGNOSIS — R279 Unspecified lack of coordination: Secondary | ICD-10-CM | POA: Insufficient documentation

## 2023-01-09 DIAGNOSIS — R269 Unspecified abnormalities of gait and mobility: Secondary | ICD-10-CM | POA: Diagnosis not present

## 2023-01-09 DIAGNOSIS — M6281 Muscle weakness (generalized): Secondary | ICD-10-CM | POA: Diagnosis not present

## 2023-01-09 DIAGNOSIS — R2689 Other abnormalities of gait and mobility: Secondary | ICD-10-CM | POA: Diagnosis not present

## 2023-01-09 DIAGNOSIS — R2681 Unsteadiness on feet: Secondary | ICD-10-CM | POA: Diagnosis not present

## 2023-01-09 NOTE — Therapy (Signed)
OUTPATIENT PHYSICAL THERAPY TREATMENT   Patient Name: Tom Underwood MRN: [id number] DOB:November 17, 1951, 72 y.o., male Today's Date: 01/09/2023   PCP: Steele Sizer, MD REFERRING PROVIDER: Myles Gip, DO   PT End of Session - 01/09/23 1431     Visit Number 33    Number of Visits 42    Date for PT Re-Evaluation 03/08/23    PT Start Time 4098    PT Stop Time 1526    PT Time Calculation (min) 55 min    Equipment Utilized During Treatment Other (comment)    Activity Tolerance Patient tolerated treatment well;Patient limited by fatigue;No increased pain    Behavior During Therapy WFL for tasks assessed/performed              Past Medical History:  Diagnosis Date   Anxiety    Arthritis    Atrial fibrillation ([place])    a.) CHA2DS2-VASc = 4 (age, HTN, CVA x2). b.) s/p 200 J synchronized cardioversion (DCCV) on 12/08/2021. c.) rate/rythum maintained on oral metoprolol tartrate; chronically anticoagulated using rivaroxaban.   BPH (benign prostatic hyperplasia)    Complication of anesthesia    a.)  Intolerant of propofol; causes "skin burning and involuntary muscle twitching"   DDD (degenerative disc disease), lumbar    Decreased libido    Depression    Diverticulitis    Diverticulosis    GERD (gastroesophageal reflux disease)    Hepatic steatosis    History of 2019 novel coronavirus disease (COVID-19) 12/28/2020   History of cardiac murmur in childhood    History of kidney stones    HLD (hyperlipidemia)    HLD (hyperlipidemia)    Hypertension    Hypogonadism in male    Long term current use of anticoagulant    a.) rivaroxaban   OSA on CPAP    Pre-diabetes    Rhinitis, allergic    Stroke ([place]) 07/23/2019   a.) LEFT posterior cor radiata. b.) residual RIGHT sided weakness   Past Surgical History:  Procedure Laterality Date   CARDIOVERSION N/A 12/08/2021   Procedure: CARDIOVERSION;  Surgeon: Nelva Bush, MD;  Location: [facility];  Service: Cardiovascular;   Laterality: N/A;   COLONOSCOPY     EVALUATION UNDER ANESTHESIA WITH HEMORRHOIDECTOMY N/A 04/15/2022   Procedure: EXAM UNDER ANESTHESIA WITH HEMORRHOIDECTOMY;  Surgeon: Benjamine Sprague, DO;  Location: [facility];  Service: General;  Laterality: N/A;   EXTERNAL EAR SURGERY     EYE SURGERY     FINGER SURGERY Right    lateration to 5 th digit   KNEE SURGERY     left eye     RECTAL EXAM UNDER ANESTHESIA N/A 04/23/2022   Procedure: RECTAL EXAM UNDER ANESTHESIA WITH LIGATION OF BLEEDING;  Surgeon: Olean Ree, MD;  Location: [facility];  Service: General;  Laterality: N/A;   Patient Active Problem List   Diagnosis Date Noted   Gastroesophageal reflux disease without esophagitis 05/17/2022   Dysthymia 05/17/2022   History of syncope 05/17/2022   History of hemorrhoidectomy 05/17/2022   Vitamin D deficiency 05/17/2022   Lower extremity edema 05/17/2022   History of rectal bleeding 05/17/2022   Bleeding internal hemorrhoids    Morbid obesity ([place])    Persistent atrial fibrillation ([place]) 07/14/2020   Hypertension    Hemiparesis of right dominant side as late effect of cerebral infarction ([place]) 08/13/2019   History of kidney stones 05/02/2018   ED (erectile dysfunction) 05/02/2018   Hyperglycemia 01/30/2017   Arthritis of knee, degenerative 01/30/2017   Benign prostatic  hyperplasia without lower urinary tract symptoms 02/03/2016   Dyslipidemia 08/04/2015   Diverticulitis of large intestine without perforation or abscess without bleeding 06/30/2015   OSA on CPAP 06/30/2015    REFERRING DIAG: M25.511,M25.512 (ICD-10-CM) - Bilateral shoulder pain, unspecified chronicity M17.2 (ICD-10-CM) - Post-traumatic osteoarthritis of both knees  R26.81 (ICD-10-CM) - Gait instability    THERAPY DIAG:  Muscle weakness (generalized)  Unsteadiness on feet  Abnormality of gait and mobility  Unspecified lack of coordination  PERTINENT HISTORY: See evaluation  PRECAUTIONS: Fall   10/2:  6MWT: 38 feet  x 16 laps plus 12 feet (620 feet).  HR: 65 bpm/ O2 sat. 95%.   SUBJECTIVE:  01/09/23:  Pt. Reports B knee pain is "14/10 on the L and 12/10 on the R today". Pt. Reports he had trouble getting around the house due to such high pain. Pt. Has trouble with any tasks involving prolonged standing due to increase pain in B LE's.   TODAY'S TREATMENT:   01/09/23:    Nustep L5 10+ min. B UE/LE.  Seat position #10/ arm position #10.  0.56 miles.  HR: 83 bpm.  O2 sat: 95%.  Increase generalized muscle fatigue with reports of SOB (vitals are WNL).    In //-bars for UE support/safety: double banded 4-way walking (3 laps each)   TRX squats with clinic chair directly behind pt. For squat depth cue and support/rest breaks if needed. 2x15  Sitting TrA contraction with hand and contralateral knee raises. 2x 45 sec. bouts.       Discussion for home walking program around [place] to stay active on a daily basis.     PATIENT EDUCATION: Education details: HEP/ daily activity Person educated: Patient Education method: Customer service manager Education comprehension: verbalized understanding and returned demonstration   HOME EXERCISE PROGRAM: Handouts given for shoulder ROM/ strengthening/ standing hip ex.    PT Short Term Goals -  01/11/23      PT SHORT TERM GOAL #1   Title Pt able to complete 6MWT and ambulate >800 feet with no assistive device safely.     Baseline 10/2:  620 feet.  HR: 65 bpm/ O2 sat. 95%.   Time 4    Period Weeks    Status Not met              PT Long Term Goals -  03/08/2023      PT LONG TERM GOAL #1   Title Pt will improve FOTO score to 50 to display improvement in functional mobility.    Baseline 6/7: 33.  10/23: 52   Time 8   Period Weeks    Status Goal met on 10/23   Target Date 12/05/2022     PT LONG TERM GOAL #2   Title Pt wil improve TUG score to < 12 sec with no AD to display significant imporvement in reduced risk of falls    Baseline 11/23: 26  sec (did not sit down immediately leading to higher time than he would've scored) 1/19: 14.44 sec.   6/7: 12.66 sec.    Time 8   Period Weeks    Status Partially Met    Target Date 03/08/2023     PT LONG TERM GOAL #3   Title Pt will improve DGI to >19/24 to indicate decreased risk of falls with household and community walking tasks    Baseline 11/23: 11/24 1/19:12/24    Time 8    Period Weeks    Status Partially  Met    Target Date 03/08/2023     PT LONG TERM GOAL #4   Title Pt. Will increase R hip flexion/ LE muscle strength 1/2 muscle grade to improve standing tolerance/ walking endurance.    Baseline See above   Time 8    Period Weeks    Status Partially Met    Target Date 03/08/2023           Plan -     Clinical Impression Statement Pt. Arrived to PT with a stiff/antalgic gait using a SPC. Pt. Works hard during tx. Pt. Cued to contract TrA during treatment to take pressure off spine and increase core strength. Pt. Cued to focus on quad activation during 4-way walking to promote LE strengthening. Pt. Limited due to endurance during standing there ex.  PT discussed importance of keeping active between PT sessions to help promote endurance. Pt. Required frequent rest breaks due to SOB/fatigue. Pt. Demonstrates weakness in LE muscles affecting functional gait and mobility. Pt will continue to benefit from skilled PT services to progress strength and mobility, along with cardiovascular endurance.    Personal Factors and Comorbidities Comorbidity 2    Comorbidities Hypertension, Obesity    Examination-Activity Limitations Bed Mobility;Reach Overhead;Squat;Lift;Dressing;Hygiene/Grooming    Examination-Participation Restrictions Community Activity;Yard Work    Merchant [employer] officer Evolving/Moderate complexity    Clinical Decision Making Moderate    Rehab Potential Fair    PT Frequency 1x / week    PT Duration 12 weeks    PT Treatment/Interventions ADLs/Self Care Home  Management;Gait training;Stair training;Functional mobility training;Therapeutic activities;Therapeutic exercise;Balance training;Neuromuscular re-education;Manual techniques;Patient/family education;Electrical Stimulation;Moist Heat;Cryotherapy    PT POC Progress LE strengthening/ gait   PT Home Exercise Plan Progress endurance training to improve functional activity. Add sit to stand activity.      Consulted and Agree with Plan of Care Patient         Rod Holler, SPT Pura Spice, PT, DPT # [phone] 01/09/2023, 3:45 PM

## 2023-01-12 ENCOUNTER — Telehealth: Payer: Self-pay | Admitting: Cardiology

## 2023-01-12 DIAGNOSIS — R251 Tremor, unspecified: Secondary | ICD-10-CM | POA: Diagnosis not present

## 2023-01-12 DIAGNOSIS — G4733 Obstructive sleep apnea (adult) (pediatric): Secondary | ICD-10-CM | POA: Diagnosis not present

## 2023-01-12 DIAGNOSIS — I4891 Unspecified atrial fibrillation: Secondary | ICD-10-CM | POA: Diagnosis not present

## 2023-01-12 DIAGNOSIS — Z87898 Personal history of other specified conditions: Secondary | ICD-10-CM | POA: Diagnosis not present

## 2023-01-12 DIAGNOSIS — I69351 Hemiplegia and hemiparesis following cerebral infarction affecting right dominant side: Secondary | ICD-10-CM | POA: Diagnosis not present

## 2023-01-12 NOTE — Telephone Encounter (Signed)
Patient says that he is currently not taking Xarelto. Laurann Montana wants the patient to start taking 81 MG Aspirin. Was told to discuss with Dr. Charlestine Night to verify. Please call back

## 2023-01-12 NOTE — Telephone Encounter (Signed)
Attempted to reach the patient several times. The line keeps ringing busy.

## 2023-01-16 ENCOUNTER — Ambulatory Visit: Payer: Medicare Other | Admitting: Physical Therapy

## 2023-01-16 ENCOUNTER — Encounter: Payer: Self-pay | Admitting: Physical Therapy

## 2023-01-16 DIAGNOSIS — R279 Unspecified lack of coordination: Secondary | ICD-10-CM

## 2023-01-16 DIAGNOSIS — R2689 Other abnormalities of gait and mobility: Secondary | ICD-10-CM

## 2023-01-16 DIAGNOSIS — M6281 Muscle weakness (generalized): Secondary | ICD-10-CM | POA: Diagnosis not present

## 2023-01-16 DIAGNOSIS — R2681 Unsteadiness on feet: Secondary | ICD-10-CM

## 2023-01-16 DIAGNOSIS — R269 Unspecified abnormalities of gait and mobility: Secondary | ICD-10-CM | POA: Diagnosis not present

## 2023-01-16 NOTE — Therapy (Signed)
OUTPATIENT PHYSICAL THERAPY TREATMENT   Patient Name: Tom Underwood MRN: [id number] DOB:1951-09-20, 72 y.o., male Today's Date: 01/16/2023   PCP: Steele Sizer, MD REFERRING PROVIDER: Myles Gip, DO   PT End of Session - 01/16/23 1432     Visit Number 34    Number of Visits 42    Date for PT Re-Evaluation 03/08/23    PT Start Time 1427    PT Stop Time 1519    PT Time Calculation (min) 52 min    Equipment Utilized During Treatment Other (comment)    Activity Tolerance Patient tolerated treatment well;Patient limited by fatigue    Behavior During Therapy WFL for tasks assessed/performed              Past Medical History:  Diagnosis Date   Anxiety    Arthritis    Atrial fibrillation ([place])    a.) CHA2DS2-VASc = 4 (age, HTN, CVA x2). b.) s/p 200 J synchronized cardioversion (DCCV) on 12/08/2021. c.) rate/rythum maintained on oral metoprolol tartrate; chronically anticoagulated using rivaroxaban.   BPH (benign prostatic hyperplasia)    Complication of anesthesia    a.)  Intolerant of propofol; causes "skin burning and involuntary muscle twitching"   DDD (degenerative disc disease), lumbar    Decreased libido    Depression    Diverticulitis    Diverticulosis    GERD (gastroesophageal reflux disease)    Hepatic steatosis    History of 2019 novel coronavirus disease (COVID-19) 12/28/2020   History of cardiac murmur in childhood    History of kidney stones    HLD (hyperlipidemia)    HLD (hyperlipidemia)    Hypertension    Hypogonadism in male    Long term current use of anticoagulant    a.) rivaroxaban   OSA on CPAP    Pre-diabetes    Rhinitis, allergic    Stroke ([place]) 07/23/2019   a.) LEFT posterior cor radiata. b.) residual RIGHT sided weakness   Past Surgical History:  Procedure Laterality Date   CARDIOVERSION N/A 12/08/2021   Procedure: CARDIOVERSION;  Surgeon: Nelva Bush, MD;  Location: [facility];  Service: Cardiovascular;  Laterality: N/A;    COLONOSCOPY     EVALUATION UNDER ANESTHESIA WITH HEMORRHOIDECTOMY N/A 04/15/2022   Procedure: EXAM UNDER ANESTHESIA WITH HEMORRHOIDECTOMY;  Surgeon: Benjamine Sprague, DO;  Location: [facility];  Service: General;  Laterality: N/A;   EXTERNAL EAR SURGERY     EYE SURGERY     FINGER SURGERY Right    lateration to 5 th digit   KNEE SURGERY     left eye     RECTAL EXAM UNDER ANESTHESIA N/A 04/23/2022   Procedure: RECTAL EXAM UNDER ANESTHESIA WITH LIGATION OF BLEEDING;  Surgeon: Olean Ree, MD;  Location: [facility];  Service: General;  Laterality: N/A;   Patient Active Problem List   Diagnosis Date Noted   Gastroesophageal reflux disease without esophagitis 05/17/2022   Dysthymia 05/17/2022   History of syncope 05/17/2022   History of hemorrhoidectomy 05/17/2022   Vitamin D deficiency 05/17/2022   Lower extremity edema 05/17/2022   History of rectal bleeding 05/17/2022   Bleeding internal hemorrhoids    Morbid obesity ([place])    Persistent atrial fibrillation ([place]) 07/14/2020   Hypertension    Hemiparesis of right dominant side as late effect of cerebral infarction ([place]) 08/13/2019   History of kidney stones 05/02/2018   ED (erectile dysfunction) 05/02/2018   Hyperglycemia 01/30/2017   Arthritis of knee, degenerative 01/30/2017   Benign prostatic hyperplasia without  lower urinary tract symptoms 02/03/2016   Dyslipidemia 08/04/2015   Diverticulitis of large intestine without perforation or abscess without bleeding 06/30/2015   OSA on CPAP 06/30/2015    REFERRING DIAG: M25.511,M25.512 (ICD-10-CM) - Bilateral shoulder pain, unspecified chronicity M17.2 (ICD-10-CM) - Post-traumatic osteoarthritis of both knees  R26.81 (ICD-10-CM) - Gait instability    THERAPY DIAG:  Muscle weakness (generalized)  Unsteadiness on feet  Other abnormalities of gait and mobility  Unspecified lack of coordination  PERTINENT HISTORY: See evaluation  PRECAUTIONS: Fall   10/2:  6MWT: 38 feet x 16 laps  plus 12 feet (620 feet).  HR: 65 bpm/ O2 sat. 95%.   SUBJECTIVE:  01/16/23:  Pt. Reports B knee pain is "9/10 on the L and 8/10 on the R today". Pt. Reports being able to do chores around the house with some increased pain. Pt. Reports he was unable to perform any walking outside his home over the weekend. Pt. Reports compliance with HEP. Pt. Reports trouble getting into and out of chairs efficiently at restaurants.   TODAY'S TREATMENT:   01/16/23:    Nustep L5 10+ min. B UE/LE.  Seat position #10/ arm position #10.  0.60 miles.  HR: 95 bpm.  O2 sat: 95%.  Increase generalized muscle fatigue with reports of SOB (vitals are WNL).    Walking In //-bars for UE support/safety (3 laps).  Agility ladder: (2x laps each) -Dynamic shuffle  -Lateral stepping hip abduction. Cue to stretch out step length to blue line each time.    -High marches    Seated blue TB abduction with TrA contraction. 2x20 reps.     Discussion for home walking program around [place] to stay active on a daily basis.     PATIENT EDUCATION: Education details: HEP/ daily activity Person educated: Patient Education method: Customer service manager Education comprehension: verbalized understanding and returned demonstration   HOME EXERCISE PROGRAM: Handouts given for shoulder ROM/ strengthening/ standing hip ex.    PT Short Term Goals -  01/11/23      PT SHORT TERM GOAL #1   Title Pt able to complete 6MWT and ambulate >800 feet with no assistive device safely.     Baseline 10/2:  620 feet.  HR: 65 bpm/ O2 sat. 95%.   Time 4    Period Weeks    Status Not met - Test next visit.              PT Long Term Goals -  03/08/2023      PT LONG TERM GOAL #1   Title Pt will improve FOTO score to 50 to display improvement in functional mobility.    Baseline 6/7: 33.  10/23: 52   Time 8   Period Weeks    Status Goal met on 10/23   Target Date 12/05/2022     PT LONG TERM GOAL #2   Title Pt wil improve TUG  score to < 12 sec with no AD to display significant imporvement in reduced risk of falls    Baseline 11/23: 26 sec (did not sit down immediately leading to higher time than he would've scored) 1/19: 14.44 sec.   6/7: 12.66 sec.    Time 8   Period Weeks    Status Partially Met    Target Date 03/08/2023     PT LONG TERM GOAL #3   Title Pt will improve DGI to >19/24 to indicate decreased risk of falls with household and community walking tasks  Baseline 11/23: 11/24 1/19:12/24    Time 8    Period Weeks    Status Partially Met    Target Date 03/08/2023     PT LONG TERM GOAL #4   Title Pt. Will increase R hip flexion/ LE muscle strength 1/2 muscle grade to improve standing tolerance/ walking endurance.    Baseline See above   Time 8    Period Weeks    Status Partially Met    Target Date 03/08/2023           Plan -     Clinical Impression Statement Pt. Arrived to PT with a stiff/antalgic gait using a SPC. Pt. Works hard during tx. Pt. Cued to contract TrA during treatment to take pressure off spine and increase core strength. PT. Had pt. Outside of //-bars today without any UE support or assistive device. Pt. Cued to focus on hip/glute activation during lateral walking/abduction there. ex. to promote LE strengthening. Pt. Limited due to endurance during standing there ex.  PT discussed importance of keeping active between PT sessions to help promote endurance. Pt. Required frequent rest breaks due to SOB/fatigue. Pt. Demonstrates weakness in LE muscles affecting functional gait and mobility. Pt will continue to benefit from skilled PT services to progress strength and mobility, along with cardiovascular endurance.    Personal Factors and Comorbidities Comorbidity 2    Comorbidities Hypertension, Obesity    Examination-Activity Limitations Bed Mobility;Reach Overhead;Squat;Lift;Dressing;Hygiene/Grooming    Examination-Participation Restrictions Community Activity;Yard Work     Merchant [employer] officer Evolving/Moderate complexity    Clinical Decision Making Moderate    Rehab Potential Fair    PT Frequency 1x / week    PT Duration 12 weeks    PT Treatment/Interventions ADLs/Self Care Home Management;Gait training;Stair training;Functional mobility training;Therapeutic activities;Therapeutic exercise;Balance training;Neuromuscular re-education;Manual techniques;Patient/family education;Electrical Stimulation;Moist Heat;Cryotherapy    PT POC 6 MWT - Address short term goals!   PT Home Exercise Plan Progress endurance training to improve functional activity. Add sit to stand activity.      Consulted and Agree with Plan of Care Patient         Rod Holler, SPT Pura Spice, PT, DPT # [phone] 01/16/2023, 3:35 PM

## 2023-01-17 NOTE — Telephone Encounter (Signed)
I called and spoke with the patient. He confirmed that he has had hesitations about proceeding with [facility], and has issues with taking Xarelto.   However, he saw Kerry Dory, PA with [facility] on 01/12/23 and she was concerned that he was totally unprotected from stroke with no anticoagulation or ASA.  The patient advised that Alice Rieger had mentioned taking at a least an ASA 81 mg once daily, but was advised to follow up with Dr. Garen Lah about this.   I have reviewed Kaitlin, PA's note and don't see a mention of ASA, but her note does state: - On Xarelto, not taking at this time. Not on any secondary preventative measures. Recommend he follow-up with Cardiology   I have advised the patient I will forward this message to Dr. Garen Lah to follow up on any recommendations he has in regards to ASA.  He is aware we will be back in touch with him once further MD recommendations are received.  The patient voices understanding and is agreeable. He was very appreciative of the call back.

## 2023-01-18 ENCOUNTER — Ambulatory Visit: Payer: Medicare Other

## 2023-01-18 DIAGNOSIS — F341 Dysthymic disorder: Secondary | ICD-10-CM

## 2023-01-18 DIAGNOSIS — N400 Enlarged prostate without lower urinary tract symptoms: Secondary | ICD-10-CM

## 2023-01-18 DIAGNOSIS — K219 Gastro-esophageal reflux disease without esophagitis: Secondary | ICD-10-CM

## 2023-01-18 DIAGNOSIS — E785 Hyperlipidemia, unspecified: Secondary | ICD-10-CM

## 2023-01-18 MED ORDER — PANTOPRAZOLE SODIUM 20 MG PO TBEC: 20.0000 mg | DELAYED_RELEASE_TABLET | Freq: Every day | ORAL | 1 refills | Status: DC

## 2023-01-18 MED ORDER — ROSUVASTATIN CALCIUM 20 MG PO TABS: 20.0000 mg | ORAL_TABLET | Freq: Every day | ORAL | 2 refills | Status: DC

## 2023-01-18 MED ORDER — TAMSULOSIN HCL 0.4 MG PO CAPS: 0.4000 mg | ORAL_CAPSULE | Freq: Every day | ORAL | 1 refills | Status: DC

## 2023-01-18 MED ORDER — BUPROPION HCL ER (XL) 150 MG PO TB24: 150.0000 mg | ORAL_TABLET | Freq: Every morning | ORAL | 1 refills | Status: DC

## 2023-01-18 NOTE — Progress Notes (Signed)
Care Management & Coordination Services Pharmacy Note  01/18/2023 Name:  Tom Underwood MRN:  [id number] DOB:  Jan 06, 1951  Summary: Patient presents for follow-up consult.   -Patient potential candidate for LAA closure procedure given bleed risk with anticoagulants. Patient is waiting to hear back from cardiology regarding initiation of aspirin to minimize stroke risk while debating whether to undergo watchman procedure.   Recommendations/Changes made from today's visit: Continue current medications  Follow up plan: CPP follow-up 6 months  Subjective: Tom Underwood is an 72 y.o. year old male who is a primary patient of Steele Sizer, MD.  The care coordination team was consulted for assistance with disease management and care coordination needs.    Engaged with patient by telephone for follow up visit.  Recent office visits: 11/15/22: Patient presented to Dr. Ancil Boozer for follow-up. Xarelto stopped.   Recent consult visits: 01/12/23: Patient presented to Kerry Dory, PA (Neurology) for follow-up.  09/06/22: Patient presented to Dr. Manuella Ghazi (Neurology) for follow-up.   Hospital visits: None in previous 6 months   Objective:  Lab Results  Component Value Date   CREATININE 1.10 09/02/2022   BUN 18 04/24/2022   EGFR 79 11/15/2021   GFRNONAA >60 04/24/2022   GFRAA >60 07/17/2020   NA 141 04/24/2022   K 4.4 04/24/2022   CALCIUM 9.1 04/24/2022   CO2 28 04/24/2022   GLUCOSE 139 (H) 04/24/2022    Lab Results  Component Value Date/Time   HGBA1C 5.2 07/01/2022 11:18 AM   HGBA1C 5.5 05/28/2021 01:36 PM    Last diabetic Eye exam: No results found for: "HMDIABEYEEXA"  Last diabetic Foot exam: No results found for: "HMDIABFOOTEX"   Lab Results  Component Value Date   CHOL 116 07/01/2022   HDL 48 07/01/2022   LDLCALC 55 07/01/2022   TRIG 59 07/01/2022   CHOLHDL 2.4 07/01/2022       Latest Ref Rng & Units 04/23/2022    7:55 PM 04/23/2022    4:36 PM 08/31/2021     2:34 PM  Hepatic Function  Total Protein 6.5 - 8.1 g/dL 6.6  6.4  8.2   Albumin 3.5 - 5.0 g/dL 3.5  3.5  4.2   AST 15 - 41 U/L 25  21  21   $ ALT 0 - 44 U/L 20  20  26   $ Alk Phosphatase 38 - 126 U/L 84  84  109   Total Bilirubin 0.3 - 1.2 mg/dL 1.2  1.4  1.7     Lab Results  Component Value Date/Time   TSH 2.530 05/28/2021 01:36 PM   TSH 2.986 07/14/2020 11:56 PM   TSH 2.311 07/14/2020 05:19 PM   TSH 2.400 08/07/2015 09:22 AM       Latest Ref Rng & Units 07/01/2022   11:18 AM 04/26/2022    9:40 AM 04/24/2022    5:17 AM  CBC  WBC 3.4 - 10.8 x10E3/uL 5.2  8.1  10.2   Hemoglobin 13.0 - 17.7 g/dL 14.4  11.1  11.9   Hematocrit 37.5 - 51.0 % 42.1  31.4  35.7   Platelets 150 - 450 x10E3/uL 219  252  270     Lab Results  Component Value Date/Time   VD25OH 64.6 05/28/2021 01:36 PM    Clinical ASCVD: Yes  The ASCVD Risk score (Arnett DK, et al., 2019) failed to calculate for the following reasons:   The patient has a prior MI or stroke diagnosis       11/15/2022  10:17 AM 08/24/2022   10:40 AM 05/17/2022   10:36 AM  Depression screen PHQ 2/9  Decreased Interest 0 0 1  Down, Depressed, Hopeless 0 1 1  PHQ - 2 Score 0 1 2  Altered sleeping 0 0 0  Tired, decreased energy 0 0 1  Change in appetite 0 0 0  Feeling bad or failure about yourself  0 0 0  Trouble concentrating 0 0 0  Moving slowly or fidgety/restless 0 0 0  Suicidal thoughts 0 0 0  PHQ-9 Score 0 1 3     Social History   Tobacco Use  Smoking Status Former   Types: Pipe   Start date: 02/20/1973   Quit date: 02/20/1974   Years since quitting: 48.9  Smokeless Tobacco Never   BP Readings from Last 3 Encounters:  12/23/22 112/78  11/15/22 132/74  09/07/22 (!) 148/80   Pulse Readings from Last 3 Encounters:  12/23/22 95  11/15/22 70  09/07/22 74   Wt Readings from Last 3 Encounters:  12/23/22 (!) 303 lb (137.4 kg)  11/15/22 (!) 303 lb 1.6 oz (137.5 kg)  09/07/22 300 lb (136.1 kg)   BMI Readings from  Last 3 Encounters:  12/23/22 43.48 kg/m  11/15/22 43.49 kg/m  09/07/22 43.05 kg/m    Allergies  Allergen Reactions   Penicillins Itching    Medications Reviewed Today     Reviewed by Pura Spice, PT (Physical Therapist) on 01/16/23 at 1533  Med List Status: <None>   Medication Order Taking? Sig Documenting Provider Last Dose Status Informant  amitriptyline (ELAVIL) 25 MG tablet [id number] No TAKE 1 TABLET BY MOUTH AT BEDTIME AS NEEDED FOR SLEEP Sowles, Drue Stager, MD Taking Active Spouse/Significant Other  buPROPion (WELLBUTRIN XL) 150 MG 24 hr tablet [id number] No Take 1 tablet (150 mg total) by mouth every morning. Steele Sizer, MD Taking Active   busPIRone (BUSPAR) 10 MG tablet [id number] No Take 1 tablet (10 mg total) by mouth 2 (two) times daily. Steele Sizer, MD Taking Active   Cholecalciferol (VITAMIN D) 125 MCG (5000 UT) CAPS [id number] No Take 5,000 Units by mouth daily. [provider] Taking Active Spouse/Significant Other  fluticasone (FLONASE) 50 MCG/ACT nasal spray [id number] No Place 2 sprays into both nostrils daily. Teodora Medici, DO Taking Active Spouse/Significant Other  furosemide (LASIX) 20 MG tablet [id number]  Take 1 tablet (20 mg total) by mouth as needed. For lower extremity swelling. Kate Sable, MD  Active   GLUCOSAMINE-CHONDROITIN PO [id number] No Take 2 tablets by mouth in the morning and at bedtime. [provider] Taking Active Spouse/Significant Other           Med Note Arby Barrette   [id number] Apr 23, 2022  2:18 PM)    lidocaine (XYLOCAINE) 5 % ointment [id number] No Apply 1 application. topically 3 (three) times daily as needed. Apply pea size amount to area as needed Lysle Pearl, Isami, DO Taking Active   methocarbamol (ROBAXIN) 500 MG tablet [id number] No Take 500 mg by mouth every 8 (eight) hours as needed for muscle spasms. [provider] Taking Active Spouse/Significant Other  metoprolol tartrate (LOPRESSOR) 25 MG  tablet [id number]  Take 1 tablet (25 mg total) by mouth 2 (two) times daily. Kate Sable, MD  Active   Multiple Vitamin (MULTIVITAMIN) tablet [id number] No Take 1 tablet by mouth daily. [provider] Taking Active Spouse/Significant Other  omega-3 acid ethyl esters (LOVAZA) 1 g capsule [id number] No Take 2 capsules (2 g total) by mouth  2 (two) times daily. Myles Gip, DO Taking Active   pantoprazole (PROTONIX) 20 MG tablet [id number] No Take 1 tablet (20 mg total) by mouth daily. Steele Sizer, MD Taking Active   rosuvastatin (CRESTOR) 20 MG tablet [id number] No Take 1 tablet (20 mg total) by mouth daily. Kate Sable, MD Taking Active Spouse/Significant Other  Saccharomyces boulardii (PROBIOTIC) 250 MG CAPS [id number] No Take 1 capsule by mouth daily. Steele Sizer, MD Taking Active Spouse/Significant Other  sildenafil (VIAGRA) 50 MG tablet [id number] No  [provider] Taking Active   tamsulosin (FLOMAX) 0.4 MG CAPS capsule [id number] No Take 1 capsule (0.4 mg total) by mouth daily. Steele Sizer, MD Taking Active   valsartan (DIOVAN) 80 MG tablet [id number]  Take 1 tablet (80 mg total) by mouth daily. Kate Sable, MD  Active   VISINE DRY EYE RELIEF 1 % SOLN [id number] No Place 1 drop into both eyes daily as needed (dry eyes). [provider] Taking Active Spouse/Significant Other            SDOH:  (Social Determinants of Health) assessments and interventions performed: Yes SDOH Interventions    Flowsheet Row Clinical Support from 05/17/2022 in [place] Management from 01/26/2022 in [place] Management from 07/22/2021 in [place] Management from 12/17/2020 in [facility] Telephone from 07/28/2020 in [place] Interventions       Food Insecurity Interventions Intervention Not  Indicated -- -- -- --  Housing Interventions Intervention Not Indicated -- -- -- --  Transportation Interventions Intervention Not Indicated -- -- Intervention Not Indicated [facility]  Depression Interventions/Treatment  PHQ2-9 Score <4 Follow-up Not Indicated -- -- -- --  Financial Strain Interventions Intervention Not Indicated Intervention Not Indicated Intervention Not Indicated Intervention Not Indicated --  Physical Activity Interventions Intervention Not Indicated -- -- -- --  Stress Interventions Intervention Not Indicated -- -- -- --  Social Connections Interventions Intervention Not Indicated -- -- -- --       Medication Assistance: None required.  Patient affirms current coverage meets needs.  Medication Access: Within the past 30 days, how often has patient missed a dose of medication? Yes Is a pillbox or other method used to improve adherence? No  Factors that may affect medication adherence? no barriers identified Are meds synced by current pharmacy? No  Are meds delivered by current pharmacy? No  Does patient experience delays in picking up medications due to transportation concerns? No   [facility] Reviewed: Is patient disadvantaged to use [facility]?: Yes  Current Rx insurance plan: [employer] Name and location of Current pharmacy:  [id number] [place] [id number]: [phone]: [phone] [facility] services reviewed with patient today?: No  Patient requests to transfer care to [facility]?: No  Reason patient declined to change pharmacies: Disadvantaged due to insurance/mail order  Compliance/Adherence/Medication fill history: Care Gaps: Covid  Star-Rating Drugs: Rosuvastatin 20 mg: Last filled 08/07/22 for 90-DS   Assessment/Plan  Hypertension (BP goal <130/80) -Controlled -Current treatment: Furosemide 20 mg daily as needed: Appropriate, Effective,  Safe, Accessible  Metoprolol tartrate 25 mg 1/2 tablet twice daily: Appropriate, Effective, Safe, Accessible  Valsartan 80 mg daily: Appropriate, Effective, Safe, Accessible -Medications previously tried: NA  -Current home readings: 128/69  -Office blood pressures often elevated due to white coat hypertension.  -  Current dietary habits: trying to limit salt, greasy/fried foods. -Current exercise habits: Continues with rehab once weekly. Walks a few times weekly weather permitting.  -Denies hypotensive/hypertensive symptoms.  -Recommended to continue current medication  Hyperlipidemia: (LDL goal < 55) -Controlled -Stroke 2020 -Current treatment: Lovaza 2g twice daily  Rosuvastatin 20 mg daily  -Medications previously tried: NA  -Recommended to continue current medication  Atrial Fibrillation (Goal: prevent stroke and major bleeding) -Controlled -CHADSVASC: 4 -Current treatment: Rate control: Metoprolol tartrate 25 mg twice daily  Anticoagulation: None  -Medications previously tried: Xarelto (GI Bleed)  -Patient potential candidate for LAA closure procedure given bleed risk with anticoagulants. Patient is waiting to hear back from cardiology regarding initiation of aspirin to minimize stroke risk while debating whether to undergo watchman procedure.  -Recommended to continue current medication  Depression/Anxiety/Insomnia(Goal: Maintain stable mood) -Controlled -Counseling with Dr. Michail Sermon  -Current treatment: Amitriptyline 25 mg nightly as needed Wellbutrin XL 150 mg daily  Buspirone 10 mg twice daily (Often forgets PM dose)  -Medications previously tried/failed: NA -Recommended to continue current medication  BPH (Goal: Minimize symptoms) -Controlled -Current treatment  Tamsulosin 0.4 mg daily: Appropriate, Effective, Safe, Accessible -Medications previously tried: NA -More urinary frequency throughout the day.  Mild improvement in symptoms since starting tamsulosin.  Still has to use the restroom every 2-3 hours.  -Recommended to continue current medication  Malva Limes, [place] Pharmacist Practitioner  [facility] [phone]

## 2023-01-19 ENCOUNTER — Telehealth: Payer: Self-pay | Admitting: Cardiology

## 2023-01-19 NOTE — Telephone Encounter (Signed)
Kate Sable, MD  SentOtila Back  4:01 PM  To: Emily Filbert, RN  Cc: Bubba Hales, Kennis Carina, [place]         Message  Okay to start aspirin 81 mg daily.  Thank you

## 2023-01-19 NOTE — Telephone Encounter (Signed)
Called patient to review Dr. Garen Lah recommendations -- busy tone.

## 2023-01-19 NOTE — Telephone Encounter (Signed)
Patient is calling stating the [facility] clinic advised him to call and discuss being put on aspirin due to not currently being on a blood thinner. Please advise.

## 2023-01-20 NOTE — Telephone Encounter (Signed)
Attempted to call patient.  No answer. Only busy tone.

## 2023-01-23 ENCOUNTER — Ambulatory Visit: Payer: Medicare Other

## 2023-01-23 DIAGNOSIS — R2681 Unsteadiness on feet: Secondary | ICD-10-CM

## 2023-01-23 DIAGNOSIS — R269 Unspecified abnormalities of gait and mobility: Secondary | ICD-10-CM | POA: Diagnosis not present

## 2023-01-23 DIAGNOSIS — R279 Unspecified lack of coordination: Secondary | ICD-10-CM

## 2023-01-23 DIAGNOSIS — M6281 Muscle weakness (generalized): Secondary | ICD-10-CM | POA: Diagnosis not present

## 2023-01-23 DIAGNOSIS — R2689 Other abnormalities of gait and mobility: Secondary | ICD-10-CM | POA: Diagnosis not present

## 2023-01-23 MED ORDER — ASPIRIN 81 MG PO TBEC: 81.0000 mg | DELAYED_RELEASE_TABLET | Freq: Every day | ORAL | 3 refills | Status: AC

## 2023-01-23 NOTE — Addendum Note (Signed)
Addended by: Janan Ridge on: 01/23/2023 10:48 AM   Modules accepted: Orders

## 2023-01-23 NOTE — Telephone Encounter (Signed)
Spoke with patient.  Patient is agreeable to starting aspirin 81.  Medication list updated.

## 2023-01-23 NOTE — Therapy (Unsigned)
OUTPATIENT PHYSICAL THERAPY TREATMENT   Patient Name: Tom Underwood MRN: [id number] DOB:03-Jul-1951, 72 y.o., male Today's Date: 01/24/2023   PCP: Steele Sizer, MD REFERRING PROVIDER: Myles Gip, DO   PT End of Session - 01/23/23 1307     Visit Number 35    Number of Visits 42    Date for PT Re-Evaluation 03/08/23    PT Start Time 1307    PT Stop Time 1409    PT Time Calculation (min) 62 min    Equipment Utilized During Treatment Other (comment)    Activity Tolerance Patient tolerated treatment well;Patient limited by fatigue    Behavior During Therapy WFL for tasks assessed/performed              Past Medical History:  Diagnosis Date   Anxiety    Arthritis    Atrial fibrillation ([place])    a.) CHA2DS2-VASc = 4 (age, HTN, CVA x2). b.) s/p 200 J synchronized cardioversion (DCCV) on 12/08/2021. c.) rate/rythum maintained on oral metoprolol tartrate; chronically anticoagulated using rivaroxaban.   BPH (benign prostatic hyperplasia)    Complication of anesthesia    a.)  Intolerant of propofol; causes "skin burning and involuntary muscle twitching"   DDD (degenerative disc disease), lumbar    Decreased libido    Depression    Diverticulitis    Diverticulosis    GERD (gastroesophageal reflux disease)    Hepatic steatosis    History of 2019 novel coronavirus disease (COVID-19) 12/28/2020   History of cardiac murmur in childhood    History of kidney stones    HLD (hyperlipidemia)    HLD (hyperlipidemia)    Hypertension    Hypogonadism in male    Long term current use of anticoagulant    a.) rivaroxaban   OSA on CPAP    Pre-diabetes    Rhinitis, allergic    Stroke ([place]) 07/23/2019   a.) LEFT posterior cor radiata. b.) residual RIGHT sided weakness   Past Surgical History:  Procedure Laterality Date   CARDIOVERSION N/A 12/08/2021   Procedure: CARDIOVERSION;  Surgeon: Nelva Bush, MD;  Location: [facility] ORS;  Service: Cardiovascular;  Laterality:  N/A;   COLONOSCOPY     EVALUATION UNDER ANESTHESIA WITH HEMORRHOIDECTOMY N/A 04/15/2022   Procedure: EXAM UNDER ANESTHESIA WITH HEMORRHOIDECTOMY;  Surgeon: Benjamine Sprague, DO;  Location: [facility] ORS;  Service: General;  Laterality: N/A;   EXTERNAL EAR SURGERY     EYE SURGERY     FINGER SURGERY Right    lateration to 5 th digit   KNEE SURGERY     left eye     RECTAL EXAM UNDER ANESTHESIA N/A 04/23/2022   Procedure: RECTAL EXAM UNDER ANESTHESIA WITH LIGATION OF BLEEDING;  Surgeon: Olean Ree, MD;  Location: [facility] ORS;  Service: General;  Laterality: N/A;   Patient Active Problem List   Diagnosis Date Noted   Gastroesophageal reflux disease without esophagitis 05/17/2022   Dysthymia 05/17/2022   History of syncope 05/17/2022   History of hemorrhoidectomy 05/17/2022   Vitamin D deficiency 05/17/2022   Lower extremity edema 05/17/2022   History of rectal bleeding 05/17/2022   Bleeding internal hemorrhoids    Morbid obesity ([place])    Persistent atrial fibrillation ([place]) 07/14/2020   Hypertension    Hemiparesis of right dominant side as late effect of cerebral infarction ([place]) 08/13/2019   History of kidney stones 05/02/2018   ED (erectile dysfunction) 05/02/2018   Hyperglycemia 01/30/2017   Arthritis of knee, degenerative 01/30/2017   Benign prostatic hyperplasia without  lower urinary tract symptoms 02/03/2016   Dyslipidemia 08/04/2015   Diverticulitis of large intestine without perforation or abscess without bleeding 06/30/2015   OSA on CPAP 06/30/2015    REFERRING DIAG: M25.511,M25.512 (ICD-10-CM) - Bilateral shoulder pain, unspecified chronicity M17.2 (ICD-10-CM) - Post-traumatic osteoarthritis of both knees  R26.81 (ICD-10-CM) - Gait instability    THERAPY DIAG:  Muscle weakness (generalized)  Unsteadiness on feet  Abnormality of gait and mobility  Unspecified lack of coordination  PERTINENT HISTORY: See evaluation  PRECAUTIONS: Fall   10/2:  6MWT: 38 feet x 16 laps  plus 12 feet (620 feet).  HR: 65 bpm/ O2 sat. 96%.  2/19: 6MWT: 68 feet x 6laps plus 10 ft. = (418)  Post 6MWT VS: HR- 67 O2 Sat: 97  SUBJECTIVE:  01/23/23:  Pt. Reports B knee pain is "13/10 on the L and 12/10 on the R today". Pt. Reports being able to do chores around the house with some increased pain. Pt. Reports he was able to perform some walking using his [facility] outside his home over the weekend. Pt. Reports compliance with HEP. Pt. Reports being unable to go shopping with his wife due to the amount of walking involved. Pt. Reports his Dr. Has prescribed him to take a daily aspirin.   TODAY'S TREATMENT:   01/23/23:     There Ex.: Nustep L3 5 min. B UE/LE.  Seat position #10/ arm position #10.  0.60 miles.  HR: 95 bpm.  O2 sat: 95%.  Increase generalized muscle fatigue with reports of SOB (vitals are WNL).      6MWT: 6MWT: 68 feet x 6laps plus 10 ft. = (418) Post 6MWT VS: HR- 67 O2 Sat: 97      Discussion for home walking program around [place] to stay active on a daily basis.       PT issued new HEP for stretching.   Manual Therapy:   L Knee: IR/ER rotational mobilization of tibia / A-P mobilization / Tibiofemoral Distraction to increase pt. Mobility.   PATIENT EDUCATION: Education details: HEP/ daily activity Person educated: Patient Education method: Customer service manager Education comprehension: verbalized understanding and returned demonstration   HOME EXERCISE PROGRAM: Handouts given for shoulder ROM/ strengthening/ standing hip ex.    PT Short Term Goals -  01/11/23      PT SHORT TERM GOAL #1   Title Pt able to complete 6MWT and ambulate >800 feet with no assistive device safely.     Baseline 10/2:  620 feet.  HR: 65 bpm/ O2 sat. 95%. 2/19: 418 feet. HR: 67 bpm/ O2 sat.97%   Time 4    Period Weeks    Status Partially met              PT Long Term Goals -  03/08/2023      PT LONG TERM GOAL #1   Title Pt will improve FOTO score to 50 to display  improvement in functional mobility.    Baseline 6/7: 33.  10/23: 52   Time 8   Period Weeks    Status Goal met on 10/23   Target Date 12/05/2022     PT LONG TERM GOAL #2   Title Pt wil improve TUG score to < 12 sec with no AD to display significant imporvement in reduced risk of falls    Baseline 11/23: 26 sec (did not sit down immediately leading to higher time than he would've scored) 1/19: 14.44 sec.   6/7: 12.66 sec.  Time 8   Period Weeks    Status Partially Met    Target Date 03/08/2023     PT LONG TERM GOAL #3   Title Pt will improve DGI to >19/24 to indicate decreased risk of falls with household and community walking tasks    Baseline 11/23: 11/24 1/19:12/24    Time 8    Period Weeks    Status Partially Met    Target Date 03/08/2023     PT LONG TERM GOAL #4   Title Pt. Will increase R hip flexion/ LE muscle strength 1/2 muscle grade to improve standing tolerance/ walking endurance.    Baseline See above   Time 8    Period Weeks    Status Partially Met    Target Date 03/08/2023           Plan -     Clinical Impression Statement Pt. Arrived to PT with a stiff/antalgic gait using a SPC. Pt. Works hard during tx. PT re-administered 6MWT to reassess pt.'s short-term goal. Pt. Completed 6 MWT without taking a rest break. Pt. Completed his first lap of ambulation(68 ft.) without an assistive device and completed his last 5 laps of ambulation with a SPC. Pt. Ended with a total 6MWT distance of 418 ft. Showing no improvement from the previous score. PT noted that the pt.'s goal was partially but not completely met due to the distance ambulated and the use of a SPC. Pt. Was Limited during 6 MWT due to significant deficits in endurance and increased pain. PT discussed importance of keeping active between PT sessions to help promote endurance. Pt. Demonstrates weakness in LE muscles affecting functional gait and mobility. Pt will continue to benefit from skilled PT services to progress  strength and mobility, along with cardiovascular endurance.    Personal Factors and Comorbidities Comorbidity 2    Comorbidities Hypertension, Obesity    Examination-Activity Limitations Bed Mobility;Reach Overhead;Squat;Lift;Dressing;Hygiene/Grooming    Examination-Participation Restrictions Community Activity;Yard Work    Merchant navy officer Evolving/Moderate complexity    Clinical Decision Making Moderate    Rehab Potential Fair    PT Frequency 1x / week    PT Duration 12 weeks    PT Treatment/Interventions ADLs/Self Care Home Management;Gait training;Stair training;Functional mobility training;Therapeutic activities;Therapeutic exercise;Balance training;Neuromuscular re-education;Manual techniques;Patient/family education;Electrical Stimulation;Moist Heat;Cryotherapy    PT POC Check new HEP / progress dynamic movement   PT Home Exercise Plan Progress endurance training to improve functional activity. Add sit to stand activity.      Consulted and Agree with Plan of Care Patient         Rod Holler, SPT Joaquin Music PT DPT 9:59 AM,01/24/23  01/24/2023, 9:59 AM

## 2023-01-25 ENCOUNTER — Telehealth: Payer: Self-pay | Admitting: Cardiology

## 2023-01-25 NOTE — Telephone Encounter (Signed)
Spoke with the patient and informed him that the aspirin EC 81 MG tablet will not interfere with the mechanism of action of the Tylenol that he takes nightly for pain. Patient was satisfied with the response.

## 2023-01-25 NOTE — Telephone Encounter (Signed)
Pt c/o medication issue:  1. Name of Medication:  aspirin EC 81 MG tablet   2. How are you currently taking this medication (dosage and times per day)?   3. Are you having a reaction (difficulty breathing--STAT)?   4. What is your medication issue?   Patient states when he spoke with the nurse about starting on Aspirin he forgot to mention that he takes Tylenol every night. He would like to know if this will interefer with the Aspirin in the morning. Please advise.

## 2023-01-30 ENCOUNTER — Ambulatory Visit: Payer: Medicare Other | Admitting: Physical Therapy

## 2023-02-01 ENCOUNTER — Encounter: Payer: Self-pay | Admitting: Physical Therapy

## 2023-02-01 ENCOUNTER — Ambulatory Visit: Payer: Medicare Other | Admitting: Physical Therapy

## 2023-02-01 DIAGNOSIS — M6281 Muscle weakness (generalized): Secondary | ICD-10-CM

## 2023-02-01 DIAGNOSIS — R269 Unspecified abnormalities of gait and mobility: Secondary | ICD-10-CM | POA: Diagnosis not present

## 2023-02-01 DIAGNOSIS — R279 Unspecified lack of coordination: Secondary | ICD-10-CM

## 2023-02-01 DIAGNOSIS — R2681 Unsteadiness on feet: Secondary | ICD-10-CM | POA: Diagnosis not present

## 2023-02-01 DIAGNOSIS — R2689 Other abnormalities of gait and mobility: Secondary | ICD-10-CM | POA: Diagnosis not present

## 2023-02-01 NOTE — Therapy (Signed)
OUTPATIENT PHYSICAL THERAPY TREATMENT   Patient Name: Tom Underwood MRN: [id number] DOB:09-30-1951, 72 y.o., male Today's Date: 02/01/2023   PCP: Steele Sizer, MD REFERRING PROVIDER: Myles Gip, DO   PT End of Session - 02/01/23 1344     Visit Number 36    Number of Visits 42    Date for PT Re-Evaluation 03/08/23    PT Start Time 1343    PT Stop Time 1433    PT Time Calculation (min) 50 min    Equipment Utilized During Treatment Other (comment)    Activity Tolerance Patient tolerated treatment well;Patient limited by fatigue;Patient limited by pain    Behavior During Therapy WFL for tasks assessed/performed              Past Medical History:  Diagnosis Date   Anxiety    Arthritis    Atrial fibrillation ([place])    a.) CHA2DS2-VASc = 4 (age, HTN, CVA x2). b.) s/p 200 J synchronized cardioversion (DCCV) on 12/08/2021. c.) rate/rythum maintained on oral metoprolol tartrate; chronically anticoagulated using rivaroxaban.   BPH (benign prostatic hyperplasia)    Complication of anesthesia    a.)  Intolerant of propofol; causes "skin burning and involuntary muscle twitching"   DDD (degenerative disc disease), lumbar    Decreased libido    Depression    Diverticulitis    Diverticulosis    GERD (gastroesophageal reflux disease)    Hepatic steatosis    History of 2019 novel coronavirus disease (COVID-19) 12/28/2020   History of cardiac murmur in childhood    History of kidney stones    HLD (hyperlipidemia)    HLD (hyperlipidemia)    Hypertension    Hypogonadism in male    Long term current use of anticoagulant    a.) rivaroxaban   OSA on CPAP    Pre-diabetes    Rhinitis, allergic    Stroke ([place]) 07/23/2019   a.) LEFT posterior cor radiata. b.) residual RIGHT sided weakness   Past Surgical History:  Procedure Laterality Date   CARDIOVERSION N/A 12/08/2021   Procedure: CARDIOVERSION;  Surgeon: Nelva Bush, MD;  Location: [facility];  Service:  Cardiovascular;  Laterality: N/A;   COLONOSCOPY     EVALUATION UNDER ANESTHESIA WITH HEMORRHOIDECTOMY N/A 04/15/2022   Procedure: EXAM UNDER ANESTHESIA WITH HEMORRHOIDECTOMY;  Surgeon: Benjamine Sprague, DO;  Location: [facility];  Service: General;  Laterality: N/A;   EXTERNAL EAR SURGERY     EYE SURGERY     FINGER SURGERY Right    lateration to 5 th digit   KNEE SURGERY     left eye     RECTAL EXAM UNDER ANESTHESIA N/A 04/23/2022   Procedure: RECTAL EXAM UNDER ANESTHESIA WITH LIGATION OF BLEEDING;  Surgeon: Olean Ree, MD;  Location: [facility];  Service: General;  Laterality: N/A;   Patient Active Problem List   Diagnosis Date Noted   Gastroesophageal reflux disease without esophagitis 05/17/2022   Dysthymia 05/17/2022   History of syncope 05/17/2022   History of hemorrhoidectomy 05/17/2022   Vitamin D deficiency 05/17/2022   Lower extremity edema 05/17/2022   History of rectal bleeding 05/17/2022   Bleeding internal hemorrhoids    Morbid obesity ([place])    Persistent atrial fibrillation ([place]) 07/14/2020   Hypertension    Hemiparesis of right dominant side as late effect of cerebral infarction ([place]) 08/13/2019   History of kidney stones 05/02/2018   ED (erectile dysfunction) 05/02/2018   Hyperglycemia 01/30/2017   Arthritis of knee, degenerative 01/30/2017   Benign  prostatic hyperplasia without lower urinary tract symptoms 02/03/2016   Dyslipidemia 08/04/2015   Diverticulitis of large intestine without perforation or abscess without bleeding 06/30/2015   OSA on CPAP 06/30/2015    REFERRING DIAG: M25.511,M25.512 (ICD-10-CM) - Bilateral shoulder pain, unspecified chronicity M17.2 (ICD-10-CM) - Post-traumatic osteoarthritis of both knees  R26.81 (ICD-10-CM) - Gait instability    THERAPY DIAG:  Muscle weakness (generalized)  Unsteadiness on feet  Abnormality of gait and mobility  Unspecified lack of coordination  PERTINENT HISTORY: See evaluation  PRECAUTIONS:  Fall   10/2:  6MWT: 38 feet x 16 laps plus 12 feet (620 feet).  HR: 65 bpm/ O2 sat. 96%.  2/19: 6MWT: 68 feet x 6laps plus 10 ft. = (418)  Post 6MWT VS: HR- 67 O2 Sat: 97  SUBJECTIVE:  02/01/23:   Pt. Reports B knee pain is "14/10 on the L and 11/10 on the R today". Pt. Reports feeling limited in any chores requiring much weight bearing. Pt. Reports having a stumble due to not clearing his feet all the way.   TODAY'S TREATMENT:   02/01/23:     There Ex.: Nustep L3 15 min. B UE/LE.  Seat position #10/ arm position #10.  0.66 miles.  HR: 65 bpm.  O2 sat: 97%.  Increase generalized muscle fatigue.    TRX squats 2x15 reps. Chair behind patient for cue on squat depth. Pt. Cued to keep toes behind knees to avoid excess pressure on knee joint.   Lateral banded walking with Blue TB. 3 laps(down/back) in agility ladder.   6" hurdle walking in //-bars for UE support. CGA via gait belt by PT. 3 laps(down/back).    PATIENT EDUCATION: Education details: HEP/ daily activity Person educated: Patient Education method: Customer service manager Education comprehension: verbalized understanding and returned demonstration   HOME EXERCISE PROGRAM: Handouts given for shoulder ROM/ strengthening/ standing hip ex.    PT Short Term Goals -  01/11/23      PT SHORT TERM GOAL #1   Title Pt able to complete 6MWT and ambulate >800 feet with no assistive device safely.     Baseline 10/2:  620 feet.  HR: 65 bpm/ O2 sat. 95%. 2/19: 418 feet. HR: 67 bpm/ O2 sat.97%   Time 4    Period Weeks    Status Partially met              PT Long Term Goals -  03/08/2023      PT LONG TERM GOAL #1   Title Pt will improve FOTO score to 50 to display improvement in functional mobility.    Baseline 6/7: 33.  10/23: 52   Time 8   Period Weeks    Status Goal met on 10/23   Target Date 12/05/2022     PT LONG TERM GOAL #2   Title Pt wil improve TUG score to < 12 sec with no AD to display significant  imporvement in reduced risk of falls    Baseline 11/23: 26 sec (did not sit down immediately leading to higher time than he would've scored) 1/19: 14.44 sec.   6/7: 12.66 sec.    Time 8   Period Weeks    Status Partially Met    Target Date 03/08/2023     PT LONG TERM GOAL #3   Title Pt will improve DGI to >19/24 to indicate decreased risk of falls with household and community walking tasks    Baseline 11/23: 11/24 1/19:12/24    Time 8  Period Weeks    Status Partially Met    Target Date 03/08/2023     PT LONG TERM GOAL #4   Title Pt. Will increase R hip flexion/ LE muscle strength 1/2 muscle grade to improve standing tolerance/ walking endurance.    Baseline See above   Time 8    Period Weeks    Status Partially Met    Target Date 03/08/2023           Plan -     Clinical Impression Statement Pt. Arrived to PT with a stiff/antalgic gait using a SPC. Pt. Works hard during tx. PT assessed vitals after NuStep to ensure they were in WNL and had no concerns(see above). Pt. Demonstrates significant antalgic gait due to high pain level in B LE's. PT had pt. Perform 6" hurdle stepping to help pt. Practice clearing his feet and decrease fall risk. Pt. Requires many rest breaks due to both pain and SOB from decreased endurance. PT discussed importance of keeping active between PT sessions to help promote endurance. Pt. Demonstrates weakness in LE muscles affecting functional gait and mobility. Pt will continue to benefit from skilled PT services to progress strength and mobility, along with cardiovascular endurance.    Personal Factors and Comorbidities Comorbidity 2    Comorbidities Hypertension, Obesity    Examination-Activity Limitations Bed Mobility;Reach Overhead;Squat;Lift;Dressing;Hygiene/Grooming    Examination-Participation Restrictions Community Activity;Yard Work    [employer]chant [employer] officer Evolving/Moderate complexity    Clinical Decision Making Moderate    Rehab Potential  Fair    PT Frequency 1x / week    PT Duration 12 weeks    PT Treatment/Interventions ADLs/Self Care Home Management;Gait training;Stair training;Functional mobility training;Therapeutic activities;Therapeutic exercise;Balance training;Neuromuscular re-education;Manual techniques;Patient/family education;Electrical Stimulation;Moist Heat;Cryotherapy    PT POC Check new HEP / progress dynamic movement   PT Home Exercise Plan Progress endurance training to improve functional activity. Add sit to stand activity.      Consulted and Agree with Plan of Care Patient         Rod Holler, SPT Pura Spice, PT, DPT # [phone] 02/01/2023, 5:11 PM

## 2023-02-03 ENCOUNTER — Telehealth: Payer: Self-pay | Admitting: *Deleted

## 2023-02-03 NOTE — Telephone Encounter (Signed)
Pt calling asking if we are familiar with a vitamin ProstaGenix? Pt states it helps shrink the prostate. He said it is now approved by the FDA and he can order online or pick-up at [facility].   I advised pt was last seen 09/2022 and per Dr.Stoioff: Hematuria most likely secondary to BPH. Consider finasteride for recurrent gross hematuria   Pt states he would like a prescription for Finasteride if it helps shrink the prostate, pt states shannon told him he is was enlarged and pt states it will help him stop going to the bathroom so often?

## 2023-02-06 ENCOUNTER — Ambulatory Visit: Payer: Medicare Other | Admitting: Physical Therapy

## 2023-02-06 MED ORDER — FINASTERIDE 5 MG PO TABS: 5.0000 mg | ORAL_TABLET | Freq: Every day | ORAL | 2 refills | Status: DC

## 2023-02-06 NOTE — Telephone Encounter (Signed)
Patient notified and voiced understanding. He will pick up Finasteride.

## 2023-02-07 ENCOUNTER — Ambulatory Visit: Payer: Medicare Other | Admitting: Physical Therapy

## 2023-02-13 ENCOUNTER — Encounter: Payer: Self-pay | Admitting: Physical Therapy

## 2023-02-13 ENCOUNTER — Ambulatory Visit: Payer: Medicare Other | Attending: Family Medicine | Admitting: Physical Therapy

## 2023-02-13 DIAGNOSIS — R2689 Other abnormalities of gait and mobility: Secondary | ICD-10-CM | POA: Insufficient documentation

## 2023-02-13 DIAGNOSIS — R2681 Unsteadiness on feet: Secondary | ICD-10-CM | POA: Insufficient documentation

## 2023-02-13 DIAGNOSIS — R262 Difficulty in walking, not elsewhere classified: Secondary | ICD-10-CM | POA: Diagnosis not present

## 2023-02-13 DIAGNOSIS — R269 Unspecified abnormalities of gait and mobility: Secondary | ICD-10-CM | POA: Diagnosis not present

## 2023-02-13 DIAGNOSIS — M25611 Stiffness of right shoulder, not elsewhere classified: Secondary | ICD-10-CM | POA: Diagnosis not present

## 2023-02-13 DIAGNOSIS — R279 Unspecified lack of coordination: Secondary | ICD-10-CM | POA: Diagnosis not present

## 2023-02-13 DIAGNOSIS — M6281 Muscle weakness (generalized): Secondary | ICD-10-CM | POA: Diagnosis not present

## 2023-02-13 NOTE — Therapy (Signed)
OUTPATIENT PHYSICAL THERAPY TREATMENT   Patient Name: Tom Underwood MRN: [id number] DOB:05-14-1951, 72 y.o., male Today's Date: 02/13/2023  PCP: Steele Sizer, MD REFERRING PROVIDER: Myles Gip, DO   PT End of Session - 02/13/23 1246     Visit Number 37    Number of Visits 42    Date for PT Re-Evaluation 03/08/23    PT Start Time 1304    PT Stop Time 1355    PT Time Calculation (min) 51 min    Equipment Utilized During Treatment Other (comment)    Activity Tolerance Patient tolerated treatment well;Patient limited by fatigue;Patient limited by pain    Behavior During Therapy WFL for tasks assessed/performed              Past Medical History:  Diagnosis Date   Anxiety    Arthritis    Atrial fibrillation ([place])    a.) CHA2DS2-VASc = 4 (age, HTN, CVA x2). b.) s/p 200 J synchronized cardioversion (DCCV) on 12/08/2021. c.) rate/rythum maintained on oral metoprolol tartrate; chronically anticoagulated using rivaroxaban.   BPH (benign prostatic hyperplasia)    Complication of anesthesia    a.)  Intolerant of propofol; causes "skin burning and involuntary muscle twitching"   DDD (degenerative disc disease), lumbar    Decreased libido    Depression    Diverticulitis    Diverticulosis    GERD (gastroesophageal reflux disease)    Hepatic steatosis    History of 2019 novel coronavirus disease (COVID-19) 12/28/2020   History of cardiac murmur in childhood    History of kidney stones    HLD (hyperlipidemia)    HLD (hyperlipidemia)    Hypertension    Hypogonadism in male    Long term current use of anticoagulant    a.) rivaroxaban   OSA on CPAP    Pre-diabetes    Rhinitis, allergic    Stroke ([place]) 07/23/2019   a.) LEFT posterior cor radiata. b.) residual RIGHT sided weakness   Past Surgical History:  Procedure Laterality Date   CARDIOVERSION N/A 12/08/2021   Procedure: CARDIOVERSION;  Surgeon: Nelva Bush, MD;  Location: [facility];  Service:  Cardiovascular;  Laterality: N/A;   COLONOSCOPY     EVALUATION UNDER ANESTHESIA WITH HEMORRHOIDECTOMY N/A 04/15/2022   Procedure: EXAM UNDER ANESTHESIA WITH HEMORRHOIDECTOMY;  Surgeon: Benjamine Sprague, DO;  Location: [facility];  Service: General;  Laterality: N/A;   EXTERNAL EAR SURGERY     EYE SURGERY     FINGER SURGERY Right    lateration to 5 th digit   KNEE SURGERY     left eye     RECTAL EXAM UNDER ANESTHESIA N/A 04/23/2022   Procedure: RECTAL EXAM UNDER ANESTHESIA WITH LIGATION OF BLEEDING;  Surgeon: Olean Ree, MD;  Location: [facility];  Service: General;  Laterality: N/A;   Patient Active Problem List   Diagnosis Date Noted   Gastroesophageal reflux disease without esophagitis 05/17/2022   Dysthymia 05/17/2022   History of syncope 05/17/2022   History of hemorrhoidectomy 05/17/2022   Vitamin D deficiency 05/17/2022   Lower extremity edema 05/17/2022   History of rectal bleeding 05/17/2022   Bleeding internal hemorrhoids    Morbid obesity ([place])    Persistent atrial fibrillation ([place]) 07/14/2020   Hypertension    Hemiparesis of right dominant side as late effect of cerebral infarction ([place]) 08/13/2019   History of kidney stones 05/02/2018   ED (erectile dysfunction) 05/02/2018   Hyperglycemia 01/30/2017   Arthritis of knee, degenerative 01/30/2017   Benign prostatic  hyperplasia without lower urinary tract symptoms 02/03/2016   Dyslipidemia 08/04/2015   Diverticulitis of large intestine without perforation or abscess without bleeding 06/30/2015   OSA on CPAP 06/30/2015    REFERRING DIAG: M25.511,M25.512 (ICD-10-CM) - Bilateral shoulder pain, unspecified chronicity M17.2 (ICD-10-CM) - Post-traumatic osteoarthritis of both knees  R26.81 (ICD-10-CM) - Gait instability   THERAPY DIAG:  Muscle weakness (generalized)  Unsteadiness on feet  Abnormality of gait and mobility  Unspecified lack of coordination  Other abnormalities of gait and mobility  Difficulty in walking,  not elsewhere classified  PERTINENT HISTORY: See evaluation  PRECAUTIONS: Fall   10/2:  6MWT: 38 feet x 16 laps plus 12 feet (620 feet).  HR: 65 bpm/ O2 sat. 96%.  2/19: 6MWT: 68 feet x 6laps plus 10 ft. = (418)  Post 6MWT VS: HR- 67 O2 Sat: 97  SUBJECTIVE:  02/13/23:   Pt. Reports B knee pain is "9/10.  Pt. Reports feeling ill with upper respiratory issues last week.  Pt. Reports no LOB but entered PT with slight SOB.     TODAY'S TREATMENT:    There Ex.: Nustep L5 10 min. B UE/LE.  Seat position #10/ arm position #10.  0.52 miles.  HR: 73 bpm.  O2 sat: 94%.  Increase generalized muscle fatigue.  Decrease O2 sats. As compared to previous tx. Sessions.   Walking in //-bars: forward/ backwards/ lateral 4 laps each.  Minimal cuing to correct upright posture/ step length.  6" hurdle walking in //-bars (posture correction).  4 laps.   TRX squats 2x15 reps. Chair behind patient for cue on squat depth. Pt. Cued to keep toes behind knees to avoid excess pressure on knee joint.    PATIENT EDUCATION: Education details: HEP/ daily activity Person educated: Patient Education method: Customer service manager Education comprehension: verbalized understanding and returned demonstration   HOME EXERCISE PROGRAM: Handouts given for shoulder ROM/ strengthening/ standing hip ex.    PT Short Term Goals -  01/11/23      PT SHORT TERM GOAL #1   Title Pt able to complete 6MWT and ambulate >800 feet with no assistive device safely.     Baseline 10/2:  620 feet.  HR: 65 bpm/ O2 sat. 95%. 2/19: 418 feet. HR: 67 bpm/ O2 sat.97%   Time 4    Period Weeks    Status Partially met              PT Long Term Goals -  03/08/2023      PT LONG TERM GOAL #1   Title Pt will improve FOTO score to 50 to display improvement in functional mobility.    Baseline 6/7: 33.  10/23: 52   Time 8   Period Weeks    Status Goal met on 10/23   Target Date 12/05/2022     PT LONG TERM GOAL #2   Title Pt wil  improve TUG score to < 12 sec with no AD to display significant imporvement in reduced risk of falls    Baseline 11/23: 26 sec (did not sit down immediately leading to higher time than he would've scored) 1/19: 14.44 sec.   6/7: 12.66 sec.    Time 8   Period Weeks    Status Partially Met    Target Date 03/08/2023     PT LONG TERM GOAL #3   Title Pt will improve DGI to >19/24 to indicate decreased risk of falls with household and community walking tasks    Baseline 11/23: 11/24  1/19:12/24    Time 8    Period Weeks    Status Partially Met    Target Date 03/08/2023     PT LONG TERM GOAL #4   Title Pt. Will increase R hip flexion/ LE muscle strength 1/2 muscle grade to improve standing tolerance/ walking endurance.    Baseline See above   Time 8    Period Weeks    Status Partially Met    Target Date 03/08/2023           Plan -     Clinical Impression Statement Pt. Arrived to PT with a stiff/antalgic gait using a SPC. Pt. More short of breath during tx. Session than normal.  Pt. Vitals assessed t/o tx. Session. Pt. Works hard during tx. Pt. Demonstrates significant antalgic gait due to high pain level in B LE's. PT had pt. Perform 6" hurdle stepping to help pt. Practice clearing his feet and decrease fall risk. Pt. Requires many rest breaks due to both pain and SOB from decreased endurance. PT discussed importance of keeping active between PT sessions to help promote endurance. Pt. Demonstrates weakness in LE muscles affecting functional gait and mobility. Pt will continue to benefit from skilled PT services to progress strength and mobility, along with cardiovascular endurance.    Personal Factors and Comorbidities Comorbidity 2    Comorbidities Hypertension, Obesity    Examination-Activity Limitations Bed Mobility;Reach Overhead;Squat;Lift;Dressing;Hygiene/Grooming    Examination-Participation Restrictions Community Activity;Yard Work    [employer]chant [employer] officer Evolving/Moderate  complexity    Clinical Decision Making Moderate    Rehab Potential Fair    PT Frequency 1x / week    PT Duration 12 weeks    PT Treatment/Interventions ADLs/Self Care Home Management;Gait training;Stair training;Functional mobility training;Therapeutic activities;Therapeutic exercise;Balance training;Neuromuscular re-education;Manual techniques;Patient/family education;Electrical Stimulation;Moist Heat;Cryotherapy    PT POC Check new HEP / progress dynamic movement   PT Home Exercise Plan Progress endurance training to improve functional activity. Add sit to stand activity.      Consulted and Agree with Plan of Care Patient         Pura Spice, PT, DPT # [phone] 02/13/2023, 7:30 PM
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
## 2023-02-16 ENCOUNTER — Ambulatory Visit (INDEPENDENT_AMBULATORY_CARE_PROVIDER_SITE_OTHER): Payer: Medicare Other | Admitting: Family Medicine

## 2023-02-16 ENCOUNTER — Encounter: Payer: Self-pay | Admitting: Family Medicine

## 2023-02-16 VITALS — BP 124/68 | HR 65 | Temp 98.0°F | Resp 20 | Ht 70.0 in | Wt 308.6 lb

## 2023-02-16 DIAGNOSIS — F341 Dysthymic disorder: Secondary | ICD-10-CM

## 2023-02-16 DIAGNOSIS — G4733 Obstructive sleep apnea (adult) (pediatric): Secondary | ICD-10-CM

## 2023-02-16 DIAGNOSIS — J3089 Other allergic rhinitis: Secondary | ICD-10-CM

## 2023-02-16 DIAGNOSIS — I69351 Hemiplegia and hemiparesis following cerebral infarction affecting right dominant side: Secondary | ICD-10-CM

## 2023-02-16 DIAGNOSIS — I4819 Other persistent atrial fibrillation: Secondary | ICD-10-CM | POA: Diagnosis not present

## 2023-02-16 DIAGNOSIS — J302 Other seasonal allergic rhinitis: Secondary | ICD-10-CM

## 2023-02-16 DIAGNOSIS — N400 Enlarged prostate without lower urinary tract symptoms: Secondary | ICD-10-CM | POA: Diagnosis not present

## 2023-02-16 DIAGNOSIS — F419 Anxiety disorder, unspecified: Secondary | ICD-10-CM

## 2023-02-16 DIAGNOSIS — E559 Vitamin D deficiency, unspecified: Secondary | ICD-10-CM

## 2023-02-16 MED ORDER — FLUTICASONE PROPIONATE 50 MCG/ACT NA SUSP: 2.0000 | Freq: Every day | NASAL | 0 refills | Status: DC

## 2023-02-16 MED ORDER — BUSPIRONE HCL 10 MG PO TABS: 10.0000 mg | ORAL_TABLET | Freq: Two times a day (BID) | ORAL | 1 refills | Status: DC

## 2023-02-20 ENCOUNTER — Encounter: Payer: Medicare Other | Admitting: Physical Therapy

## 2023-02-24 ENCOUNTER — Ambulatory Visit: Payer: Medicare Other | Admitting: Physical Therapy

## 2023-02-24 ENCOUNTER — Encounter: Payer: Self-pay | Admitting: Physical Therapy

## 2023-02-24 DIAGNOSIS — M25611 Stiffness of right shoulder, not elsewhere classified: Secondary | ICD-10-CM

## 2023-02-24 DIAGNOSIS — R2689 Other abnormalities of gait and mobility: Secondary | ICD-10-CM

## 2023-02-24 DIAGNOSIS — R2681 Unsteadiness on feet: Secondary | ICD-10-CM | POA: Diagnosis not present

## 2023-02-24 DIAGNOSIS — R262 Difficulty in walking, not elsewhere classified: Secondary | ICD-10-CM

## 2023-02-24 DIAGNOSIS — R269 Unspecified abnormalities of gait and mobility: Secondary | ICD-10-CM | POA: Diagnosis not present

## 2023-02-24 DIAGNOSIS — R279 Unspecified lack of coordination: Secondary | ICD-10-CM

## 2023-02-24 DIAGNOSIS — M6281 Muscle weakness (generalized): Secondary | ICD-10-CM | POA: Diagnosis not present

## 2023-02-24 NOTE — Therapy (Signed)
OUTPATIENT PHYSICAL THERAPY TREATMENT   Patient Name: Tom Underwood MRN: [id number] DOB:February 27, 1951, 72 y.o., male Today's Date: 02/24/2023  PCP: Steele Sizer, MD REFERRING PROVIDER: Myles Gip, DO   PT End of Session - 02/24/23 1117     Visit Number 38    Number of Visits 42    Date for PT Re-Evaluation 03/08/23    PT Start Time 1118    PT Stop Time 1207    PT Time Calculation (min) 49 min    Equipment Utilized During Treatment Other (comment)    Activity Tolerance Patient tolerated treatment well;Patient limited by fatigue;Patient limited by pain    Behavior During Therapy [facility] for tasks assessed/performed              Past Medical History:  Diagnosis Date   Anxiety    Arthritis    Atrial fibrillation ([place])    a.) CHA2DS2-VASc = 4 (age, HTN, CVA x2). b.) s/p 200 J synchronized cardioversion (DCCV) on 12/08/2021. c.) rate/rythum maintained on oral metoprolol tartrate; chronically anticoagulated using rivaroxaban.   BPH (benign prostatic hyperplasia)    Complication of anesthesia    a.)  Intolerant of propofol; causes "skin burning and involuntary muscle twitching"   DDD (degenerative disc disease), lumbar    Decreased libido    Depression    Diverticulitis    Diverticulosis    GERD (gastroesophageal reflux disease)    Hepatic steatosis    History of 2019 novel coronavirus disease (COVID-19) 12/28/2020   History of cardiac murmur in childhood    History of kidney stones    HLD (hyperlipidemia)    HLD (hyperlipidemia)    Hypertension    Hypogonadism in male    Long term current use of anticoagulant    a.) rivaroxaban   OSA on CPAP    Pre-diabetes    Rhinitis, allergic    Stroke ([place]) 07/23/2019   a.) LEFT posterior cor radiata. b.) residual RIGHT sided weakness   Past Surgical History:  Procedure Laterality Date   CARDIOVERSION N/A 12/08/2021   Procedure: CARDIOVERSION;  Surgeon: Nelva Bush, MD;  Location: [facility] ORS;  Service:  Cardiovascular;  Laterality: N/A;   COLONOSCOPY     EVALUATION UNDER ANESTHESIA WITH HEMORRHOIDECTOMY N/A 04/15/2022   Procedure: EXAM UNDER ANESTHESIA WITH HEMORRHOIDECTOMY;  Surgeon: Benjamine Sprague, DO;  Location: [facility] ORS;  Service: General;  Laterality: N/A;   EXTERNAL EAR SURGERY     EYE SURGERY     FINGER SURGERY Right    lateration to 5 th digit   KNEE SURGERY     left eye     RECTAL EXAM UNDER ANESTHESIA N/A 04/23/2022   Procedure: RECTAL EXAM UNDER ANESTHESIA WITH LIGATION OF BLEEDING;  Surgeon: Olean Ree, MD;  Location: [facility] ORS;  Service: General;  Laterality: N/A;   Patient Active Problem List   Diagnosis Date Noted   Gastroesophageal reflux disease without esophagitis 05/17/2022   Dysthymia 05/17/2022   History of syncope 05/17/2022   History of hemorrhoidectomy 05/17/2022   Vitamin D deficiency 05/17/2022   Lower extremity edema 05/17/2022   History of rectal bleeding 05/17/2022   Bleeding internal hemorrhoids    Morbid obesity ([place])    Persistent atrial fibrillation ([place]) 07/14/2020   Hypertension    Hemiparesis of right dominant side as late effect of cerebral infarction ([place]) 08/13/2019   History of kidney stones 05/02/2018   ED (erectile dysfunction) 05/02/2018   Hyperglycemia 01/30/2017   Arthritis of knee, degenerative 01/30/2017   Benign prostatic  hyperplasia without lower urinary tract symptoms 02/03/2016   Dyslipidemia 08/04/2015   Diverticulitis of large intestine without perforation or abscess without bleeding 06/30/2015   OSA on CPAP 06/30/2015    REFERRING DIAG: M25.511,M25.512 (ICD-10-CM) - Bilateral shoulder pain, unspecified chronicity M17.2 (ICD-10-CM) - Post-traumatic osteoarthritis of both knees  R26.81 (ICD-10-CM) - Gait instability   THERAPY DIAG:  Muscle weakness (generalized)  Unsteadiness on feet  Abnormality of gait and mobility  Unspecified lack of coordination  Other abnormalities of gait and mobility  Difficulty in walking,  not elsewhere classified  Decreased range of motion of right shoulder  PERTINENT HISTORY: See evaluation  PRECAUTIONS: Fall  10/2:  6MWT: 38 feet x 16 laps plus 12 feet (620 feet).  HR: 65 bpm/ O2 sat. 96%.  2/19: 6MWT: 68 feet x 6laps plus 10 ft. = (418)  Post 6MWT VS: HR- 67 O2 Sat: 97  SUBJECTIVE:  02/24/2023   Pt. Reports 10/10 B knee pain prior to tx. Session.  Pt. Entered PT with slow, antalgic gait pattern.  Pt. States he has been using resistive bands at home with UE ex. Program.       TODAY'S TREATMENT:    There Ex.: Nustep L5 10 min. B UE/LE.  Seat position #10/ arm position #10.  0.55 miles.  HR: 76 bpm.  O2 sat: 93%.  Increase generalized muscle fatigue.  Slight decrease in O2 sats. As compared to previous tx. Sessions.   TRX squats 2x15 reps. Chair behind patient for cue on squat depth. Pt. Cued to keep toes behind knees to avoid excess pressure on knee joint.   Moderate fatigue noted/ L UE tremors  Standing Nautilus: 50# lat. Pull downs with added shoulder flexion holds 15x2.    Walking in //-bars: forward/ backwards 4 laps each.  Minimal cuing to correct upright posture/ step length.  Walking in hallway/ outside with instruction.  Discussed walking in restaurants/ stores with use of SPC.    PATIENT EDUCATION: Education details: HEP/ daily activity Person educated: Patient Education method: Customer service manager Education comprehension: verbalized understanding and returned demonstration   HOME EXERCISE PROGRAM: Handouts given for shoulder ROM/ strengthening/ standing hip ex.    PT Short Term Goals -  01/11/23      PT SHORT TERM GOAL #1   Title Pt able to complete 6MWT and ambulate >800 feet with no assistive device safely.     Baseline 10/2:  620 feet.  HR: 65 bpm/ O2 sat. 95%. 2/19: 418 feet. HR: 67 bpm/ O2 sat.97%   Time 4    Period Weeks    Status Partially met              PT Long Term Goals -  03/08/2023      PT LONG TERM GOAL #1    Title Pt will improve FOTO score to 50 to display improvement in functional mobility.    Baseline 6/7: 33.  10/23: 52   Time 8   Period Weeks    Status Goal met on 10/23   Target Date 12/05/2022     PT LONG TERM GOAL #2   Title Pt wil improve TUG score to < 12 sec with no AD to display significant imporvement in reduced risk of falls    Baseline 11/23: 26 sec (did not sit down immediately leading to higher time than he would've scored) 1/19: 14.44 sec.   6/7: 12.66 sec.    Time 8   Period Weeks    Status Partially  Met    Target Date 03/08/2023     PT LONG TERM GOAL #3   Title Pt will improve DGI to >19/24 to indicate decreased risk of falls with household and community walking tasks    Baseline 11/23: 11/24 1/19:12/24    Time 8    Period Weeks    Status Partially Met    Target Date 03/08/2023     PT LONG TERM GOAL #4   Title Pt. Will increase R hip flexion/ LE muscle strength 1/2 muscle grade to improve standing tolerance/ walking endurance.    Baseline See above   Time 8    Period Weeks    Status Partially Met    Target Date 03/08/2023           Plan -     Clinical Impression Statement Pt. Arrived to PT with a stiff/antalgic gait using a SPC. Pt. More short of breath during tx. Session than normal.  Pt. Vitals assessed t/o tx. Session. Pt. Works hard during tx. Pt. Demonstrates significant antalgic gait due to high pain level in B LE's. PT had pt. Perform 6" hurdle stepping to help pt. Practice clearing his feet and decrease fall risk. Pt. Requires many rest breaks due to both pain and SOB from decreased endurance. PT discussed importance of keeping active between PT sessions to help promote endurance. Pt. Demonstrates weakness in LE muscles affecting functional gait and mobility. Pt will continue to benefit from skilled PT services to progress strength and mobility, along with cardiovascular endurance.    Personal Factors and Comorbidities Comorbidity 2    Comorbidities  Hypertension, Obesity    Examination-Activity Limitations Bed Mobility;Reach Overhead;Squat;Lift;Dressing;Hygiene/Grooming    Examination-Participation Restrictions Community Activity;Yard Work    Merchant navy officer Evolving/Moderate complexity    Clinical Decision Making Moderate    Rehab Potential Fair    PT Frequency 1x / week    PT Duration 12 weeks    PT Treatment/Interventions ADLs/Self Care Home Management;Gait training;Stair training;Functional mobility training;Therapeutic activities;Therapeutic exercise;Balance training;Neuromuscular re-education;Manual techniques;Patient/family education;Electrical Stimulation;Moist Heat;Cryotherapy    PT POC GOAL UPDATE/ progress dynamic movement   PT Home Exercise Plan Progress endurance training to improve functional activity. Add sit to stand activity.      Consulted and Agree with Plan of Care Patient         Pura Spice, PT, DPT # [phone] 02/24/2023, 8:40 PM

## 2023-02-27 ENCOUNTER — Ambulatory Visit: Payer: Medicare Other | Admitting: Physical Therapy

## 2023-03-06 ENCOUNTER — Ambulatory Visit: Payer: Medicare Other | Attending: Family Medicine | Admitting: Physical Therapy

## 2023-03-06 ENCOUNTER — Encounter: Payer: Self-pay | Admitting: Physical Therapy

## 2023-03-06 DIAGNOSIS — R2689 Other abnormalities of gait and mobility: Secondary | ICD-10-CM | POA: Insufficient documentation

## 2023-03-06 DIAGNOSIS — R2681 Unsteadiness on feet: Secondary | ICD-10-CM | POA: Insufficient documentation

## 2023-03-06 DIAGNOSIS — R269 Unspecified abnormalities of gait and mobility: Secondary | ICD-10-CM | POA: Insufficient documentation

## 2023-03-06 DIAGNOSIS — M6281 Muscle weakness (generalized): Secondary | ICD-10-CM | POA: Insufficient documentation

## 2023-03-06 DIAGNOSIS — R262 Difficulty in walking, not elsewhere classified: Secondary | ICD-10-CM | POA: Diagnosis not present

## 2023-03-06 DIAGNOSIS — R279 Unspecified lack of coordination: Secondary | ICD-10-CM | POA: Insufficient documentation

## 2023-03-06 NOTE — Therapy (Signed)
OUTPATIENT PHYSICAL THERAPY TREATMENT   Patient Name: Tom Underwood MRN: [id number] DOB:[birth year], 72 y.o., male Today's Date: 03/07/2023  PCP: Steele Sizer, MD REFERRING PROVIDER: Myles Gip, DO   PT End of Session - 03/06/23 1243     Visit Number 39    Number of Visits 42    Date for PT Re-Evaluation 03/08/23    PT Start Time 1249    PT Stop Time 1345    PT Time Calculation (min) 56 min    Equipment Utilized During Treatment Other (comment)    Activity Tolerance Patient tolerated treatment well;Patient limited by fatigue;Patient limited by pain    Behavior During Therapy WFL for tasks assessed/performed              Past Medical History:  Diagnosis Date   Anxiety    Arthritis    Atrial fibrillation    a.) CHA2DS2-VASc = 4 (age, HTN, CVA x2). b.) s/p 200 J synchronized cardioversion (DCCV) on 12/08/2021. c.) rate/rythum maintained on oral metoprolol tartrate; chronically anticoagulated using rivaroxaban.   BPH (benign prostatic hyperplasia)    Complication of anesthesia    a.)  Intolerant of propofol; causes "skin burning and involuntary muscle twitching"   DDD (degenerative disc disease), lumbar    Decreased libido    Depression    Diverticulitis    Diverticulosis    GERD (gastroesophageal reflux disease)    Hepatic steatosis    History of 2019 novel coronavirus disease (COVID-19) 12/28/2020   History of cardiac murmur in childhood    History of kidney stones    HLD (hyperlipidemia)    HLD (hyperlipidemia)    Hypertension    Hypogonadism in male    Long term current use of anticoagulant    a.) rivaroxaban   OSA on CPAP    Pre-diabetes    Rhinitis, allergic    Stroke 07/23/2019   a.) LEFT posterior cor radiata. b.) residual RIGHT sided weakness   Past Surgical History:  Procedure Laterality Date   CARDIOVERSION N/A 12/08/2021   Procedure: CARDIOVERSION;  Surgeon: Nelva Bush, MD;  Location: [facility];  Service: Cardiovascular;   Laterality: N/A;   COLONOSCOPY     EVALUATION UNDER ANESTHESIA WITH HEMORRHOIDECTOMY N/A 04/15/2022   Procedure: EXAM UNDER ANESTHESIA WITH HEMORRHOIDECTOMY;  Surgeon: Benjamine Sprague, DO;  Location: [facility];  Service: General;  Laterality: N/A;   EXTERNAL EAR SURGERY     EYE SURGERY     FINGER SURGERY Right    lateration to 5 th digit   KNEE SURGERY     left eye     RECTAL EXAM UNDER ANESTHESIA N/A 04/23/2022   Procedure: RECTAL EXAM UNDER ANESTHESIA WITH LIGATION OF BLEEDING;  Surgeon: Olean Ree, MD;  Location: [facility];  Service: General;  Laterality: N/A;   Patient Active Problem List   Diagnosis Date Noted   Gastroesophageal reflux disease without esophagitis 05/17/2022   Dysthymia 05/17/2022   History of syncope 05/17/2022   History of hemorrhoidectomy 05/17/2022   Vitamin D deficiency 05/17/2022   Lower extremity edema 05/17/2022   History of rectal bleeding 05/17/2022   Bleeding internal hemorrhoids    Morbid obesity    Persistent atrial fibrillation 07/14/2020   Hypertension    Hemiparesis of right dominant side as late effect of cerebral infarction 08/13/2019   History of kidney stones 05/02/2018   ED (erectile dysfunction) 05/02/2018   Hyperglycemia 01/30/2017   Arthritis of knee, degenerative 01/30/2017   Benign prostatic hyperplasia without lower urinary tract  symptoms 02/03/2016   Dyslipidemia 08/04/2015   Diverticulitis of large intestine without perforation or abscess without bleeding 06/30/2015   OSA on CPAP 06/30/2015    REFERRING DIAG: M25.511,M25.512 (ICD-10-CM) - Bilateral shoulder pain, unspecified chronicity M17.2 (ICD-10-CM) - Post-traumatic osteoarthritis of both knees  R26.81 (ICD-10-CM) - Gait instability   THERAPY DIAG:  Muscle weakness (generalized)  Unsteadiness on feet  Abnormality of gait and mobility  Unspecified lack of coordination  Other abnormalities of gait and mobility  Difficulty in walking, not elsewhere  classified  PERTINENT HISTORY: See evaluation  PRECAUTIONS: Fall  10/2:  6MWT: 38 feet x 16 laps plus 12 feet (620 feet).  HR: 65 bpm/ O2 sat. 96%.  2/19: 6MWT: 68 feet x 6laps plus 10 ft. = (418)  Post 6MWT VS: HR- 67 O2 Sat: 97  SUBJECTIVE:  03/07/2023   Pt. Reports 12/10 L knee/ 9/10 R knee pain prior to tx. Session.  Pt. Entered PT with slow, antalgic gait pattern.  HR: 63 bpm/ O2 sat. 95%.  PT discussed POC and pts. Functional mobility with daily tasks.  Pt states he is unable to maneuver around bed so he has continued to sleep in recliner.     TODAY'S TREATMENT:   There Ex.: Nustep L5 10 min. B UE/LE.  Seat position #10/ arm position #10.  0.56 miles.  HR: 77 bpm.  O2 sat: 93%.  Discussed weekend activities and sleeping position in recliner.     Reassessment of goals.  See below.     TRX squats 2x22 reps. Chair behind patient for cue on squat depth. Pt. Cued to keep toes behind knees to avoid excess pressure on knee joint.   Moderate fatigue noted/ L UE tremors  Standing Nautilus: 50# lat. Pull downs with added shoulder flexion holds 20x2.    Walking in //-bars: forward/ backwards 4 laps each.  Minimal cuing to correct upright posture/ step length.  Walking in hallway/ outside with instruction.  No LOB but LE muscle fatigue.    PATIENT EDUCATION: Education details: HEP/ daily activity Person educated: Patient Education method: Customer service manager Education comprehension: verbalized understanding and returned demonstration   HOME EXERCISE PROGRAM: Handouts given for shoulder ROM/ strengthening/ standing hip ex.    PT Short Term Goals -  01/11/23      PT SHORT TERM GOAL #1   Title Pt able to complete 6MWT and ambulate >800 feet with no assistive device safely.     Baseline 10/2:  620 feet.  HR: 65 bpm/ O2 sat. 95%. 2/19: 418 feet. HR: 67 bpm/ O2 sat.97%   Time 4    Period Weeks    Status Partially met          PT Long Term Goals -  03/08/2023      PT  LONG TERM GOAL #1   Title Pt will improve FOTO score to 50 to display improvement in functional mobility.    Baseline 6/7: 33.  10/23: 52   Time 8   Period Weeks    Status Goal met on 10/23   Target Date 12/05/2022     PT LONG TERM GOAL #2   Title Pt wil improve TUG score to < 12 sec with no AD to display significant imporvement in reduced risk of falls    Baseline 11/23: 26 sec (did not sit down immediately leading to higher time than he would've scored) 1/19: 14.44 sec.   3/7: 12.66 sec. 03/06/23: 11.41 seconds   Time 8  Period Weeks    Status Partially Met    Target Date 03/08/2023     PT LONG TERM GOAL #3   Title Pt will improve DGI to >19/24 to indicate decreased risk of falls with household and community walking tasks    Baseline 11/23: 11/24 1/19:12/24    Time 8    Period Weeks    Status Partially Met    Target Date 03/08/2023     PT LONG TERM GOAL #4   Title Pt. Will increase R hip flexion/ LE muscle strength 1/2 muscle grade to improve standing tolerance/ walking endurance.    Baseline See above.  4/1: B hip flexion 4+/5 MMT (good control on R hip today).     Time 8    Period Weeks    Status Partially Met    Target Date 03/08/2023         Plan -     Clinical Impression Statement Pt. Arrived to PT with a stiff/antalgic gait using a SPC. Pt. More short of breath during tx. Session than normal.  Pt. Vitals assessed t/o tx. Session. Pt. Works hard during tx. Pt. Demonstrates significant antalgic gait due to high pain level in B LE's.  Pts. Legs felt heavy during walking tasks in //-bars and requires verbal cuing to increase hip flexion/ step length.  Pt. Requires many rest breaks due to both pain and SOB from decreased endurance. PT discussed importance of keeping active between PT sessions to help promote endurance. Pt. Demonstrates weakness in LE muscles affecting functional gait and mobility. Pt will continue to benefit from skilled PT services to progress strength and mobility,  along with cardiovascular endurance.    Personal Factors and Comorbidities Comorbidity 2    Comorbidities Hypertension, Obesity    Examination-Activity Limitations Bed Mobility;Reach Overhead;Squat;Lift;Dressing;Hygiene/Grooming    Examination-Participation Restrictions Community Activity;Yard Work    Merchant navy officer Evolving/Moderate complexity    Clinical Decision Making Moderate    Rehab Potential Fair    PT Frequency 1x / week    PT Duration 12 weeks    PT Treatment/Interventions ADLs/Self Care Home Management;Gait training;Stair training;Functional mobility training;Therapeutic activities;Therapeutic exercise;Balance training;Neuromuscular re-education;Manual techniques;Patient/family education;Electrical Stimulation;Moist Heat;Cryotherapy    PT POC RECERT next. Tx.     PT Home Exercise Plan Progress endurance training to improve functional activity. Add sit to stand activity.      Consulted and Agree with Plan of Care Patient         Pura Spice, PT, DPT # [phone] 03/07/2023, 11:13 AM

## 2023-03-13 ENCOUNTER — Ambulatory Visit: Payer: Medicare Other | Admitting: Physical Therapy

## 2023-03-13 ENCOUNTER — Encounter: Payer: Self-pay | Admitting: Physical Therapy

## 2023-03-13 DIAGNOSIS — R279 Unspecified lack of coordination: Secondary | ICD-10-CM | POA: Diagnosis not present

## 2023-03-13 DIAGNOSIS — R269 Unspecified abnormalities of gait and mobility: Secondary | ICD-10-CM | POA: Diagnosis not present

## 2023-03-13 DIAGNOSIS — R262 Difficulty in walking, not elsewhere classified: Secondary | ICD-10-CM | POA: Diagnosis not present

## 2023-03-13 DIAGNOSIS — R2681 Unsteadiness on feet: Secondary | ICD-10-CM

## 2023-03-13 DIAGNOSIS — M6281 Muscle weakness (generalized): Secondary | ICD-10-CM

## 2023-03-13 DIAGNOSIS — R2689 Other abnormalities of gait and mobility: Secondary | ICD-10-CM | POA: Diagnosis not present

## 2023-03-13 NOTE — Therapy (Signed)
OUTPATIENT PHYSICAL THERAPY TREATMENT/ RECERTIFICATION Physical Therapy Progress Note  Dates of reporting period  12/25/22   to   03/13/23  Patient Name: Tom Underwood MRN: [id number] DOB:Aug 29, 1951, 72 y.o., male Today's Date: 03/13/2023  PCP: Alba CorySowles, Krichna, MD REFERRING PROVIDER: Caro Larocheumball, Alison M, DO   PT End of Session - 03/13/23 1111     Visit Number 40 of 48   Date for PT Re-Evaluation 05/08/23    PT Start Time 1111 to 1201  (50 minutes)   Equipment Utilized During Treatment Other (comment)    Activity Tolerance Patient tolerated treatment well;Patient limited by fatigue;Patient limited by pain    Behavior During Therapy [facility] for tasks assessed/performed              Past Medical History:  Diagnosis Date   Anxiety    Arthritis    Atrial fibrillation    a.) CHA2DS2-VASc = 4 (age, HTN, CVA x2). b.) s/p 200 J synchronized cardioversion (DCCV) on 12/08/2021. c.) rate/rythum maintained on oral metoprolol tartrate; chronically anticoagulated using rivaroxaban.   BPH (benign prostatic hyperplasia)    Complication of anesthesia    a.)  Intolerant of propofol; causes "skin burning and involuntary muscle twitching"   DDD (degenerative disc disease), lumbar    Decreased libido    Depression    Diverticulitis    Diverticulosis    GERD (gastroesophageal reflux disease)    Hepatic steatosis    History of 2019 novel coronavirus disease (COVID-19) 12/28/2020   History of cardiac murmur in childhood    History of kidney stones    HLD (hyperlipidemia)    HLD (hyperlipidemia)    Hypertension    Hypogonadism in male    Long term current use of anticoagulant    a.) rivaroxaban   OSA on CPAP    Pre-diabetes    Rhinitis, allergic    Stroke 07/23/2019   a.) LEFT posterior cor radiata. b.) residual RIGHT sided weakness   Past Surgical History:  Procedure Laterality Date   CARDIOVERSION N/A 12/08/2021   Procedure: CARDIOVERSION;  Surgeon: Yvonne KendallEnd, Christopher, MD;  Location:  [facility];  Service: Cardiovascular;  Laterality: N/A;   COLONOSCOPY     EVALUATION UNDER ANESTHESIA WITH HEMORRHOIDECTOMY N/A 04/15/2022   Procedure: EXAM UNDER ANESTHESIA WITH HEMORRHOIDECTOMY;  Surgeon: Sung AmabileSakai, Isami, DO;  Location: [facility];  Service: General;  Laterality: N/A;   EXTERNAL EAR SURGERY     EYE SURGERY     FINGER SURGERY Right    lateration to 5 th digit   KNEE SURGERY     left eye     RECTAL EXAM UNDER ANESTHESIA N/A 04/23/2022   Procedure: RECTAL EXAM UNDER ANESTHESIA WITH LIGATION OF BLEEDING;  Surgeon: Henrene DodgePiscoya, Jose, MD;  Location: [facility];  Service: General;  Laterality: N/A;   Patient Active Problem List   Diagnosis Date Noted   Gastroesophageal reflux disease without esophagitis 05/17/2022   Dysthymia 05/17/2022   History of syncope 05/17/2022   History of hemorrhoidectomy 05/17/2022   Vitamin D deficiency 05/17/2022   Lower extremity edema 05/17/2022   History of rectal bleeding 05/17/2022   Bleeding internal hemorrhoids    Morbid obesity    Persistent atrial fibrillation 07/14/2020   Hypertension    Hemiparesis of right dominant side as late effect of cerebral infarction 08/13/2019   History of kidney stones 05/02/2018   ED (erectile dysfunction) 05/02/2018   Hyperglycemia 01/30/2017   Arthritis of knee, degenerative 01/30/2017   Benign prostatic hyperplasia without lower urinary tract symptoms  02/03/2016   Dyslipidemia 08/04/2015   Diverticulitis of large intestine without perforation or abscess without bleeding 06/30/2015   OSA on CPAP 06/30/2015    REFERRING DIAG: M25.511,M25.512 (ICD-10-CM) - Bilateral shoulder pain, unspecified chronicity M17.2 (ICD-10-CM) - Post-traumatic osteoarthritis of both knees  R26.81 (ICD-10-CM) - Gait instability   THERAPY DIAG:  Muscle weakness (generalized)   Unsteadiness on feet   Abnormality of gait and mobility   Unspecified lack of coordination   Other abnormalities of gait and mobility   Difficulty in  walking, not elsewhere classified  PERTINENT HISTORY: See evaluation  PRECAUTIONS: Fall  10/2:  : 38 feet x 16 laps plus 12 feet (620 feet).  HR: 65 bpm/ O2 sat. 96%.  2/19: : 68 feet x 6laps plus 10 ft. = (418)  Post VS: HR- 67 O2 Sat: 97  SUBJECTIVE:  03/13/2023   Pt. Reports 12/10 L knee/ 9/10 R knee pain prior to tx. Session.  Pt. States his knee pain was back this past weekend.  Pt. Entered PT with slow, antalgic gait pattern and use of SPC.    TODAY'S TREATMENT:   There Ex.: Nustep L5 10 min. B UE/LE.  Seat position #10/ arm position #10.  0.59 miles.  HR: 77 bpm.  O2 sat: 94%.  Discussed weekend activities and knee pain.   Standing at //-bars with mirror feedback: hip flexion/ hip abduction 20x.   Seated rest break before TRX.  Seated reassessment of lower leg/ ankle swelling.  Discussed PT goals.    TRX squats 2x22 reps. Chair behind patient for cue on squat depth. Pt. Cued to keep knees behind toes to avoid excess pressure on knee joint.   Moderate fatigue noted/ L UE tremors  Standing Nautilus: 50# lat. Pull downs with added shoulder flexion holds 20x  (only 1 set secondary to bathroom break).    Walking in //-bars: forward/ backwards 4 laps each.  Minimal cuing to correct upright posture/ step length.    PATIENT EDUCATION: Education details: HEP/ daily activity Person educated: Patient Education method: Medical illustrator Education comprehension: verbalized understanding and returned demonstration   HOME EXERCISE PROGRAM: Handouts given for shoulder ROM/ strengthening/ standing hip ex.    PT Short Term Goals -  04/10/23      PT SHORT TERM GOAL #1   Title Pt able to complete and ambulate >800 feet with no assistive device safely.     Baseline 10/2:  620 feet.  HR: 65 bpm/ O2 sat. 95%. 2/19: 418 feet. HR: 67 bpm/ O2 sat.97%   Time 4    Period Weeks    Status Partially met          PT Long Term Goals -  05/08/2023      PT LONG  TERM GOAL #1   Title Pt will improve FOTO score to 50 to display improvement in functional mobility.    Baseline 6/7: 33.  10/23: 52   Time 8   Period Weeks    Status Goal met on 10/23   Target Date 12/05/2022     PT LONG TERM GOAL #2   Title Pt wil improve TUG score to < 12 sec with no AD to display significant imporvement in reduced risk of falls    Baseline 11/23: 26 sec (did not sit down immediately leading to higher time than he would've scored) 1/19: 14.44 sec.   3/7: 12.66 sec. 03/06/23: 11.41 seconds.    Time 8   Period Weeks  Status Partially Met    Target Date 05/08/2023     PT LONG TERM GOAL #3   Title Pt will improve DGI to >19/24 to indicate decreased risk of falls with household and community walking tasks    Baseline 11/23: 11/24 1/19:12/24    Time 8    Period Weeks    Status Partially Met    Target Date 05/08/2023     PT LONG TERM GOAL #4   Title Pt. Will increase R hip flexion/ LE muscle strength 1/2 muscle grade to improve standing tolerance/ walking endurance.    Baseline See above.  4/1: B hip flexion 4+/5 MMT (good control on R hip today).     Time 8    Period Weeks    Status Partially Met    Target Date 05/08/2023         Plan -     Clinical Impression Statement Pt. Arrived to PT with a stiff/antalgic gait using a SPC.  Pt. Remains limited with B knee pain and lower leg swelling/ edema.  Pt. Requires bathroom break during tx. And multiple seated rest breaks to complete there.ex.  Pt. Vitals assessed t/o tx. Session. Pt. Works hard during tx. Pt. Demonstrates significant antalgic gait due to high pain level in B LE's.  Pts. Legs felt heavy during walking tasks in //-bars and requires verbal cuing to increase hip flexion/ step length.  PT discussed importance of keeping active between PT sessions to help promote endurance. Pt. Demonstrates weakness in LE muscles affecting functional gait and mobility. Pt will continue to benefit from skilled PT services to progress  strength and mobility, along with cardiovascular endurance.    Personal Factors and Comorbidities Comorbidity 2    Comorbidities Hypertension, Obesity    Examination-Activity Limitations Bed Mobility;Reach Overhead;Squat;Lift;Dressing;Hygiene/Grooming    Examination-Participation Restrictions Community Activity;Yard Work    Conservation officer, historic buildingstability/Clinical Decision Making Evolving/Moderate complexity    Clinical Decision Making Moderate    Rehab Potential Fair    PT Frequency 1x / week    PT Duration 8 weeks    PT Treatment/Interventions ADLs/Self Care Home Management;Gait training;Stair training;Functional mobility training;Therapeutic activities;Therapeutic exercise;Balance training;Neuromuscular re-education;Manual techniques;Patient/family education;Electrical Stimulation;Moist Heat;Cryotherapy    PT POC Reassess HEP/ use a walking log to document weekly progress    PT Home Exercise Plan Progress endurance training to improve functional activity. Add sit to stand activity.      Consulted and Agree with Plan of Care Patient         Cammie McgeeMichael C Leidy Massar, PT, DPT # [phone] 03/13/2023, 11:11 AM

## 2023-03-20 ENCOUNTER — Ambulatory Visit: Payer: Medicare Other | Admitting: Physical Therapy

## 2023-03-23 ENCOUNTER — Encounter: Payer: Self-pay | Admitting: Physical Therapy

## 2023-03-23 ENCOUNTER — Ambulatory Visit: Payer: Medicare Other | Admitting: Physical Therapy

## 2023-03-23 DIAGNOSIS — R262 Difficulty in walking, not elsewhere classified: Secondary | ICD-10-CM | POA: Diagnosis not present

## 2023-03-23 DIAGNOSIS — R279 Unspecified lack of coordination: Secondary | ICD-10-CM

## 2023-03-23 DIAGNOSIS — M6281 Muscle weakness (generalized): Secondary | ICD-10-CM

## 2023-03-23 DIAGNOSIS — R269 Unspecified abnormalities of gait and mobility: Secondary | ICD-10-CM

## 2023-03-23 DIAGNOSIS — R2681 Unsteadiness on feet: Secondary | ICD-10-CM

## 2023-03-23 DIAGNOSIS — R2689 Other abnormalities of gait and mobility: Secondary | ICD-10-CM | POA: Diagnosis not present

## 2023-03-23 NOTE — Therapy (Signed)
OUTPATIENT PHYSICAL THERAPY TREATMENT  Patient Name: Tom Underwood MRN: [id number] DOB:Mar 10, 1951, 72 y.o., male Today's Date: 03/23/2023  PCP: Alba Cory, MD REFERRING PROVIDER: Caro Laroche, DO    PT End of Session - 03/23/23 1302     Visit Number 41    Number of Visits 48    Date for PT Re-Evaluation 05/08/23    PT Start Time 1255    PT Stop Time 1353    PT Time Calculation (min) 58 min    Equipment Utilized During Treatment Other (comment)    Activity Tolerance Patient tolerated treatment well;Patient limited by fatigue;Patient limited by pain    Behavior During Therapy WFL for tasks assessed/performed               Past Medical History:  Diagnosis Date   Anxiety    Arthritis    Atrial fibrillation    a.) CHA2DS2-VASc = 4 (age, HTN, CVA x2). b.) s/p 200 J synchronized cardioversion (DCCV) on 12/08/2021. c.) rate/rythum maintained on oral metoprolol tartrate; chronically anticoagulated using rivaroxaban.   BPH (benign prostatic hyperplasia)    Complication of anesthesia    a.)  Intolerant of propofol; causes "skin burning and involuntary muscle twitching"   DDD (degenerative disc disease), lumbar    Decreased libido    Depression    Diverticulitis    Diverticulosis    GERD (gastroesophageal reflux disease)    Hepatic steatosis    History of 2019 novel coronavirus disease (COVID-19) 12/28/2020   History of cardiac murmur in childhood    History of kidney stones    HLD (hyperlipidemia)    HLD (hyperlipidemia)    Hypertension    Hypogonadism in male    Long term current use of anticoagulant    a.) rivaroxaban   OSA on CPAP    Pre-diabetes    Rhinitis, allergic    Stroke 07/23/2019   a.) LEFT posterior cor radiata. b.) residual RIGHT sided weakness   Past Surgical History:  Procedure Laterality Date   CARDIOVERSION N/A 12/08/2021   Procedure: CARDIOVERSION;  Surgeon: Yvonne Kendall, MD;  Location: [facility];  Service: Cardiovascular;   Laterality: N/A;   COLONOSCOPY     EVALUATION UNDER ANESTHESIA WITH HEMORRHOIDECTOMY N/A 04/15/2022   Procedure: EXAM UNDER ANESTHESIA WITH HEMORRHOIDECTOMY;  Surgeon: Sung Amabile, DO;  Location: [facility];  Service: General;  Laterality: N/A;   EXTERNAL EAR SURGERY     EYE SURGERY     FINGER SURGERY Right    lateration to 5 th digit   KNEE SURGERY     left eye     RECTAL EXAM UNDER ANESTHESIA N/A 04/23/2022   Procedure: RECTAL EXAM UNDER ANESTHESIA WITH LIGATION OF BLEEDING;  Surgeon: Henrene Dodge, MD;  Location: [facility];  Service: General;  Laterality: N/A;   Patient Active Problem List   Diagnosis Date Noted   Gastroesophageal reflux disease without esophagitis 05/17/2022   Dysthymia 05/17/2022   History of syncope 05/17/2022   History of hemorrhoidectomy 05/17/2022   Vitamin D deficiency 05/17/2022   Lower extremity edema 05/17/2022   History of rectal bleeding 05/17/2022   Bleeding internal hemorrhoids    Morbid obesity    Persistent atrial fibrillation 07/14/2020   Hypertension    Hemiparesis of right dominant side as late effect of cerebral infarction 08/13/2019   History of kidney stones 05/02/2018   ED (erectile dysfunction) 05/02/2018   Hyperglycemia 01/30/2017   Arthritis of knee, degenerative 01/30/2017   Benign prostatic hyperplasia without lower urinary  tract symptoms 02/03/2016   Dyslipidemia 08/04/2015   Diverticulitis of large intestine without perforation or abscess without bleeding 06/30/2015   OSA on CPAP 06/30/2015    REFERRING DIAG: M25.511,M25.512 (ICD-10-CM) - Bilateral shoulder pain, unspecified chronicity M17.2 (ICD-10-CM) - Post-traumatic osteoarthritis of both knees  R26.81 (ICD-10-CM) - Gait instability   THERAPY DIAG:  Muscle weakness (generalized)   Unsteadiness on feet   Abnormality of gait and mobility   Unspecified lack of coordination   Other abnormalities of gait and mobility   Difficulty in walking, not elsewhere  classified  PERTINENT HISTORY: See evaluation  PRECAUTIONS: Fall  10/2:  : 38 feet x 16 laps plus 12 feet (620 feet).  HR: 65 bpm/ O2 sat. 96%.  2/19: : 68 feet x 6laps plus 10 ft. = (418)  Post VS: HR- 67 O2 Sat: 97  SUBJECTIVE:  03/23/2023   Pt. Reports B knees "are terrible".  Pt. States "knees are bone on bone".   Pt. Entered PT with slow, antalgic gait pattern and use of SPC.  Pt. Had a good birthday weekend and no new complaints.  Pt. Arrived to PT with low socks on and moderate B ankle/lower leg edema noted.  PT discussed getting compression stockings and pt. States he is working on returning the previous pair that did not fit.    TODAY'S TREATMENT:   There Ex.: Nustep L5 10 min. B UE/LE.  Seat position #10/ arm position #10.  0.59 miles.  BP: 141/75. HR: 71 bpm.  O2 sat: 97%.  Discussed weekend activities and knee pain.   TRX squats 2x22 reps. Chair behind patient for cue on squat depth. Pt. Cued to keep knees behind toes to avoid excess pressure on knee joint.   Moderate fatigue noted/ L UE tremors    Seated LAQ/ marching in gray chair 30x.  Reassessment of lower leg swelling.   Standing Nautilus: 50# lat. Pull downs with added shoulder flexion holds 20x  (only 1 set secondary to bathroom break).    Standing at //-bars with mirror feedback: hip flexion/ hip abduction 20x.   Seated rest break before TRX.  Seated reassessment of lower leg/ ankle swelling and pulse.    Walking in //-bars: forward/ backwards 4 laps each.  Minimal cuing to correct upright posture/ step length.  Walking in hallway/ down ramp at side of PT clinic to car.     PATIENT EDUCATION: Education details: HEP/ daily activity Person educated: Patient Education method: Medical illustrator Education comprehension: verbalized understanding and returned demonstration   HOME EXERCISE PROGRAM: Handouts given for shoulder ROM/ strengthening/ standing hip ex.    PT Short Term Goals -   04/10/23      PT SHORT TERM GOAL #1   Title Pt able to complete and ambulate >800 feet with no assistive device safely.     Baseline 10/2:  620 feet.  HR: 65 bpm/ O2 sat. 95%. 2/19: 418 feet. HR: 67 bpm/ O2 sat.97%   Time 4    Period Weeks    Status Partially met          PT Long Term Goals -  05/08/2023      PT LONG TERM GOAL #1   Title Pt will improve FOTO score to 50 to display improvement in functional mobility.    Baseline 6/7: 33.  10/23: 52   Time 8   Period Weeks    Status Goal met on 10/23   Target Date 12/05/2022  PT LONG TERM GOAL #2   Title Pt wil improve TUG score to < 12 sec with no AD to display significant imporvement in reduced risk of falls    Baseline 11/23: 26 sec (did not sit down immediately leading to higher time than he would've scored) 1/19: 14.44 sec.   3/7: 12.66 sec. 03/06/23: 11.41 seconds.    Time 8   Period Weeks    Status Partially Met    Target Date 05/08/2023     PT LONG TERM GOAL #3   Title Pt will improve DGI to >19/24 to indicate decreased risk of falls with household and community walking tasks    Baseline 11/23: 11/24 1/19:12/24    Time 8    Period Weeks    Status Partially Met    Target Date 05/08/2023     PT LONG TERM GOAL #4   Title Pt. Will increase R hip flexion/ LE muscle strength 1/2 muscle grade to improve standing tolerance/ walking endurance.    Baseline See above.  4/1: B hip flexion 4+/5 MMT (good control on R hip today).     Time 8    Period Weeks    Status Partially Met    Target Date 05/08/2023         Plan -     Clinical Impression Statement Pt. Arrived to PT with a stiff/antalgic gait using a SPC.  Pt. Remains limited with B knee pain and lower leg swelling/ edema.  Pt. Works hard during tx. Pt. Demonstrates significant antalgic gait due to high pain level in B LE's.  Pts. Legs felt heavy during walking tasks in //-bars and requires verbal cuing to increase hip flexion/ step length.  PT discussed importance of  keeping active between PT sessions to help promote endurance. Pt. Demonstrates weakness in LE muscles affecting functional gait and mobility. Pt will continue to benefit from skilled PT services to progress strength and mobility, along with cardiovascular endurance.    Personal Factors and Comorbidities Comorbidity 2    Comorbidities Hypertension, Obesity    Examination-Activity Limitations Bed Mobility;Reach Overhead;Squat;Lift;Dressing;Hygiene/Grooming    Examination-Participation Restrictions Community Activity;Yard Work    Conservation officer, historic buildings Evolving/Moderate complexity    Clinical Decision Making Moderate    Rehab Potential Fair    PT Frequency 1x / week    PT Duration 8 weeks    PT Treatment/Interventions ADLs/Self Care Home Management;Gait training;Stair training;Functional mobility training;Therapeutic activities;Therapeutic exercise;Balance training;Neuromuscular re-education;Manual techniques;Patient/family education;Electrical Stimulation;Moist Heat;Cryotherapy    PT POC Reassess HEP/ use a walking log to document weekly progress    PT Home Exercise Plan Progress endurance training to improve functional activity. Add sit to stand activity.      Consulted and Agree with Plan of Care Patient         Cammie Mcgee, PT, DPT # [phone] 03/23/2023, 6:29 PM

## 2023-03-27 ENCOUNTER — Ambulatory Visit: Payer: Medicare Other | Admitting: Physical Therapy

## 2023-03-29 ENCOUNTER — Ambulatory Visit: Payer: Medicare Other | Admitting: Physical Therapy

## 2023-04-10 ENCOUNTER — Ambulatory Visit: Payer: Medicare Other | Admitting: Physical Therapy

## 2023-04-12 ENCOUNTER — Encounter: Payer: Self-pay | Admitting: Physical Therapy

## 2023-04-12 ENCOUNTER — Ambulatory Visit: Payer: Medicare Other | Attending: Family Medicine | Admitting: Physical Therapy

## 2023-04-12 DIAGNOSIS — R269 Unspecified abnormalities of gait and mobility: Secondary | ICD-10-CM | POA: Diagnosis not present

## 2023-04-12 DIAGNOSIS — R279 Unspecified lack of coordination: Secondary | ICD-10-CM | POA: Diagnosis not present

## 2023-04-12 DIAGNOSIS — R2681 Unsteadiness on feet: Secondary | ICD-10-CM | POA: Diagnosis not present

## 2023-04-12 DIAGNOSIS — R2689 Other abnormalities of gait and mobility: Secondary | ICD-10-CM | POA: Insufficient documentation

## 2023-04-12 DIAGNOSIS — M6281 Muscle weakness (generalized): Secondary | ICD-10-CM | POA: Insufficient documentation

## 2023-04-12 NOTE — Therapy (Signed)
OUTPATIENT PHYSICAL THERAPY TREATMENT  Patient Name: Tom Underwood MRN: [id number] DOB:1951/01/08, 72 y.o., male Today's Date: 04/12/2023  PCP: Alba Cory, MD REFERRING PROVIDER: Caro Laroche, DO    PT End of Session - 04/12/23 1236     Visit Number 42    Number of Visits 48    Date for PT Re-Evaluation 05/08/23    PT Start Time 0926    PT Stop Time 1033    PT Time Calculation (min) 67 min    Equipment Utilized During Treatment Other (comment)    Activity Tolerance Patient tolerated treatment well;Patient limited by fatigue;Patient limited by pain    Behavior During Therapy WFL for tasks assessed/performed               Past Medical History:  Diagnosis Date   Anxiety    Arthritis    Atrial fibrillation (HCC)    a.) CHA2DS2-VASc = 4 (age, HTN, CVA x2). b.) s/p 200 J synchronized cardioversion (DCCV) on 12/08/2021. c.) rate/rythum maintained on oral metoprolol tartrate; chronically anticoagulated using rivaroxaban.   BPH (benign prostatic hyperplasia)    Complication of anesthesia    a.)  Intolerant of propofol; causes "skin burning and involuntary muscle twitching"   DDD (degenerative disc disease), lumbar    Decreased libido    Depression    Diverticulitis    Diverticulosis    GERD (gastroesophageal reflux disease)    Hepatic steatosis    History of 2019 novel coronavirus disease (COVID-19) 12/28/2020   History of cardiac murmur in childhood    History of kidney stones    HLD (hyperlipidemia)    HLD (hyperlipidemia)    Hypertension    Hypogonadism in male    Long term current use of anticoagulant    a.) rivaroxaban   OSA on CPAP    Pre-diabetes    Rhinitis, allergic    Stroke (HCC) 07/23/2019   a.) LEFT posterior cor radiata. b.) residual RIGHT sided weakness   Past Surgical History:  Procedure Laterality Date   CARDIOVERSION N/A 12/08/2021   Procedure: CARDIOVERSION;  Surgeon: Yvonne Kendall, MD;  Location: [facility];  Service:  Cardiovascular;  Laterality: N/A;   COLONOSCOPY     EVALUATION UNDER ANESTHESIA WITH HEMORRHOIDECTOMY N/A 04/15/2022   Procedure: EXAM UNDER ANESTHESIA WITH HEMORRHOIDECTOMY;  Surgeon: Sung Amabile, DO;  Location: [facility];  Service: General;  Laterality: N/A;   EXTERNAL EAR SURGERY     EYE SURGERY     FINGER SURGERY Right    lateration to 5 th digit   KNEE SURGERY     left eye     RECTAL EXAM UNDER ANESTHESIA N/A 04/23/2022   Procedure: RECTAL EXAM UNDER ANESTHESIA WITH LIGATION OF BLEEDING;  Surgeon: Henrene Dodge, MD;  Location: [facility];  Service: General;  Laterality: N/A;   Patient Active Problem List   Diagnosis Date Noted   Gastroesophageal reflux disease without esophagitis 05/17/2022   Dysthymia 05/17/2022   History of syncope 05/17/2022   History of hemorrhoidectomy 05/17/2022   Vitamin D deficiency 05/17/2022   Lower extremity edema 05/17/2022   History of rectal bleeding 05/17/2022   Bleeding internal hemorrhoids    Morbid obesity (HCC)    Persistent atrial fibrillation (HCC) 07/14/2020   Hypertension    Hemiparesis of right dominant side as late effect of cerebral infarction (HCC) 08/13/2019   History of kidney stones 05/02/2018   ED (erectile dysfunction) 05/02/2018   Hyperglycemia 01/30/2017   Arthritis of knee, degenerative 01/30/2017   Benign  prostatic hyperplasia without lower urinary tract symptoms 02/03/2016   Dyslipidemia 08/04/2015   Diverticulitis of large intestine without perforation or abscess without bleeding 06/30/2015   OSA on CPAP 06/30/2015    REFERRING DIAG: M25.511,M25.512 (ICD-10-CM) - Bilateral shoulder pain, unspecified chronicity M17.2 (ICD-10-CM) - Post-traumatic osteoarthritis of both knees  R26.81 (ICD-10-CM) - Gait instability   THERAPY DIAG:  Muscle weakness (generalized)   Unsteadiness on feet   Abnormality of gait and mobility   Unspecified lack of coordination   Other abnormalities of gait and mobility   Difficulty in  walking, not elsewhere classified  PERTINENT HISTORY: See evaluation  PRECAUTIONS: Fall  10/2:  : 38 feet x 16 laps plus 12 feet (620 feet).  HR: 65 bpm/ O2 sat. 96%.  2/19: : 68 feet x 6laps plus 10 ft. = (418)  Post VS: HR- 67 O2 Sat: 97  SUBJECTIVE:  04/12/2023   Pt. Reports high levels of pain and joint stiffness in B knees while walking into PT clinic.  Pt. Has significant B low leg swelling noted.  Pt. Went to beach last week with wife and states he was limited with activity due to walking/ need for bathroom breaks.    TODAY'S TREATMENT:   There Ex.: Nustep L5 10 min. B UE/LE.  Seat position #10/ arm position #10.  0.58 miles.  HR: 74 bpm.  O2 sat: 95%.  Discussed pts. Trip to beach with wife.  Pt. States he walked at hotel but was limited going out shopping with wife.  PT discussed use of scooter.   Seated weighted wand B UE press-ups/ chest press 10x2.  Good upright posture.    Seated LAQ/ marching in gray chair 30x.  Discussed lower leg swelling/B knee pain and joint stiffness.  Walking in hallway 2 laps with use of SPC and cuing for proper BOS/ posture.  Limited hip/knee flexion during step pattern.     TRX squats 22x. Chair behind patient for cue on squat depth. Pt. Cued to keep knees behind toes to avoid excess pressure on knee joint.   Moderate fatigue noted/ L UE tremors  Standing Nautilus: 50# lat. Pull downs with added shoulder flexion holds 22x.    PATIENT EDUCATION: Education details: HEP/ daily activity Person educated: Patient Education method: Medical illustrator Education comprehension: verbalized understanding and returned demonstration   HOME EXERCISE PROGRAM: Handouts given for shoulder ROM/ strengthening/ standing hip ex.    PT Short Term Goals -  04/10/23      PT SHORT TERM GOAL #1   Title Pt able to complete and ambulate >800 feet with no assistive device safely.     Baseline 10/2:  620 feet.  HR: 65 bpm/ O2 sat.  95%. 2/19: 418 feet. HR: 67 bpm/ O2 sat.97%   Time 4    Period Weeks    Status Partially met          PT Long Term Goals -  05/08/2023      PT LONG TERM GOAL #1   Title Pt will improve FOTO score to 50 to display improvement in functional mobility.    Baseline 6/7: 33.  10/23: 52   Time 8   Period Weeks    Status Goal met on 10/23   Target Date 12/05/2022     PT LONG TERM GOAL #2   Title Pt wil improve TUG score to < 12 sec with no AD to display significant imporvement in reduced risk of falls  Baseline 11/23: 26 sec (did not sit down immediately leading to higher time than he would've scored) 1/19: 14.44 sec.   3/7: 12.66 sec. 03/06/23: 11.41 seconds.    Time 8   Period Weeks    Status Partially Met    Target Date 05/08/2023     PT LONG TERM GOAL #3   Title Pt will improve DGI to >19/24 to indicate decreased risk of falls with household and community walking tasks    Baseline 11/23: 11/24 1/19:12/24    Time 8    Period Weeks    Status Partially Met    Target Date 05/08/2023     PT LONG TERM GOAL #4   Title Pt. Will increase R hip flexion/ LE muscle strength 1/2 muscle grade to improve standing tolerance/ walking endurance.    Baseline See above.  4/1: B hip flexion 4+/5 MMT (good control on R hip today).     Time 8    Period Weeks    Status Partially Met    Target Date 05/08/2023         Plan -     Clinical Impression Statement Pt. Arrived to PT with a stiff/antalgic gait using a SPC.  Pt. Remains limited with B knee pain and lower leg swelling/ edema.  Pt. Works hard during tx. Pt. Demonstrates significant antalgic gait due to high pain level in B LE's.  Pts. Legs felt heavy during walking tasks in //-bars and requires verbal cuing to increase hip flexion/ step length.  PT discussed importance of keeping active between PT sessions to help promote endurance. Pt. Demonstrates weakness in LE muscles affecting functional gait and mobility. Pt will continue to benefit from skilled  PT services to progress strength and mobility, along with cardiovascular endurance.    Personal Factors and Comorbidities Comorbidity 2    Comorbidities Hypertension, Obesity    Examination-Activity Limitations Bed Mobility;Reach Overhead;Squat;Lift;Dressing;Hygiene/Grooming    Examination-Participation Restrictions Community Activity;Yard Work    Conservation officer, historic buildings Evolving/Moderate complexity    Clinical Decision Making Moderate    Rehab Potential Fair    PT Frequency 1x / week    PT Duration 8 weeks    PT Treatment/Interventions ADLs/Self Care Home Management;Gait training;Stair training;Functional mobility training;Therapeutic activities;Therapeutic exercise;Balance training;Neuromuscular re-education;Manual techniques;Patient/family education;Electrical Stimulation;Moist Heat;Cryotherapy    PT POC Reassess HEP/ use a walking log to document weekly progress    PT Home Exercise Plan Progress endurance training to improve functional activity. Add sit to stand activity.      Consulted and Agree with Plan of Care Patient         Cammie Mcgee, PT, DPT # [phone] 04/12/2023, 12:38 PM

## 2023-04-17 ENCOUNTER — Ambulatory Visit: Payer: Medicare Other | Admitting: Physical Therapy

## 2023-04-19 ENCOUNTER — Encounter: Payer: Self-pay | Admitting: Physical Therapy

## 2023-04-19 ENCOUNTER — Ambulatory Visit: Payer: Medicare Other | Admitting: Physical Therapy

## 2023-04-19 DIAGNOSIS — M6281 Muscle weakness (generalized): Secondary | ICD-10-CM

## 2023-04-19 DIAGNOSIS — R269 Unspecified abnormalities of gait and mobility: Secondary | ICD-10-CM | POA: Diagnosis not present

## 2023-04-19 DIAGNOSIS — R2681 Unsteadiness on feet: Secondary | ICD-10-CM | POA: Diagnosis not present

## 2023-04-19 DIAGNOSIS — R2689 Other abnormalities of gait and mobility: Secondary | ICD-10-CM | POA: Diagnosis not present

## 2023-04-19 DIAGNOSIS — R279 Unspecified lack of coordination: Secondary | ICD-10-CM | POA: Diagnosis not present

## 2023-04-19 NOTE — Therapy (Signed)
OUTPATIENT PHYSICAL THERAPY TREATMENT  Patient Name: Tom Underwood MRN: [id number] DOB:November 18, 1951, 72 y.o., male Today's Date: 04/19/2023  PCP: Alba Cory, MD REFERRING PROVIDER: Caro Laroche, DO    PT End of Session - 04/19/23 0952     Visit Number 43    Number of Visits 48    Date for PT Re-Evaluation 05/08/23    PT Start Time 0936    PT Stop Time 1034    PT Time Calculation (min) 58 min    Equipment Utilized During Treatment Other (comment)    Activity Tolerance Patient tolerated treatment well;Patient limited by fatigue;Patient limited by pain    Behavior During Therapy [facility] for tasks assessed/performed               Past Medical History:  Diagnosis Date   Anxiety    Arthritis    Atrial fibrillation (HCC)    a.) CHA2DS2-VASc = 4 (age, HTN, CVA x2). b.) s/p 200 J synchronized cardioversion (DCCV) on 12/08/2021. c.) rate/rythum maintained on oral metoprolol tartrate; chronically anticoagulated using rivaroxaban.   BPH (benign prostatic hyperplasia)    Complication of anesthesia    a.)  Intolerant of propofol; causes "skin burning and involuntary muscle twitching"   DDD (degenerative disc disease), lumbar    Decreased libido    Depression    Diverticulitis    Diverticulosis    GERD (gastroesophageal reflux disease)    Hepatic steatosis    History of 2019 novel coronavirus disease (COVID-19) 12/28/2020   History of cardiac murmur in childhood    History of kidney stones    HLD (hyperlipidemia)    HLD (hyperlipidemia)    Hypertension    Hypogonadism in male    Long term current use of anticoagulant    a.) rivaroxaban   OSA on CPAP    Pre-diabetes    Rhinitis, allergic    Stroke (HCC) 07/23/2019   a.) LEFT posterior cor radiata. b.) residual RIGHT sided weakness   Past Surgical History:  Procedure Laterality Date   CARDIOVERSION N/A 12/08/2021   Procedure: CARDIOVERSION;  Surgeon: Yvonne Kendall, MD;  Location: [facility] ORS;  Service:  Cardiovascular;  Laterality: N/A;   COLONOSCOPY     EVALUATION UNDER ANESTHESIA WITH HEMORRHOIDECTOMY N/A 04/15/2022   Procedure: EXAM UNDER ANESTHESIA WITH HEMORRHOIDECTOMY;  Surgeon: Sung Amabile, DO;  Location: [facility] ORS;  Service: General;  Laterality: N/A;   EXTERNAL EAR SURGERY     EYE SURGERY     FINGER SURGERY Right    lateration to 5 th digit   KNEE SURGERY     left eye     RECTAL EXAM UNDER ANESTHESIA N/A 04/23/2022   Procedure: RECTAL EXAM UNDER ANESTHESIA WITH LIGATION OF BLEEDING;  Surgeon: Henrene Dodge, MD;  Location: [facility] ORS;  Service: General;  Laterality: N/A;   Patient Active Problem List   Diagnosis Date Noted   Gastroesophageal reflux disease without esophagitis 05/17/2022   Dysthymia 05/17/2022   History of syncope 05/17/2022   History of hemorrhoidectomy 05/17/2022   Vitamin D deficiency 05/17/2022   Lower extremity edema 05/17/2022   History of rectal bleeding 05/17/2022   Bleeding internal hemorrhoids    Morbid obesity (HCC)    Persistent atrial fibrillation (HCC) 07/14/2020   Hypertension    Hemiparesis of right dominant side as late effect of cerebral infarction (HCC) 08/13/2019   History of kidney stones 05/02/2018   ED (erectile dysfunction) 05/02/2018   Hyperglycemia 01/30/2017   Arthritis of knee, degenerative 01/30/2017   Benign  prostatic hyperplasia without lower urinary tract symptoms 02/03/2016   Dyslipidemia 08/04/2015   Diverticulitis of large intestine without perforation or abscess without bleeding 06/30/2015   OSA on CPAP 06/30/2015    REFERRING DIAG: M25.511,M25.512 (ICD-10-CM) - Bilateral shoulder pain, unspecified chronicity M17.2 (ICD-10-CM) - Post-traumatic osteoarthritis of both knees  R26.81 (ICD-10-CM) - Gait instability   THERAPY DIAG:  Muscle weakness (generalized)   Unsteadiness on feet   Abnormality of gait and mobility   Unspecified lack of coordination   Other abnormalities of gait and mobility   Difficulty in  walking, not elsewhere classified  PERTINENT HISTORY: See evaluation  PRECAUTIONS: Fall  10/2:  : 38 feet x 16 laps plus 12 feet (620 feet).  HR: 65 bpm/ O2 sat. 96%.  2/19: : 68 feet x 6laps plus 10 ft. = (418)  Post VS: HR- 67 O2 Sat: 97  SUBJECTIVE:  04/19/2023   Pt. Reports high levels of pain and joint stiffness in B knees while walking into PT clinic with [facility].  15/10 L knee and 13/10 R knee.  Pt. Has significant B low leg swelling noted (R greater than L).  Pt. Rescheduled Monday appt. Secondary to shortness of breath.  Pt. Reports difficulty breathing this past weekend.    TODAY'S TREATMENT:   There Ex.: Nustep L5 10 min. B UE/LE.  Seat position #10/ arm position #10.  0.53 miles.  HR: 62 bpm.  O2 sat: 97%.  BP: 142/61.     Walking in hallway 3.5 laps with use of rollator and cuing for proper BOS/ posture.  More consistent step pattern but limited by R foot ER/wheel of rollator.  Pt. Would benefit from bariatric rollator for home/ community ambulation.    TRX squats 22x. Chair behind patient for cue on squat depth. Pt. Cued to keep knees behind toes to avoid excess pressure on knee joint.   Moderate fatigue noted/ L UE tremors.  HR: 77 bpm/ O2 sat. 92%.  Standing Nautilus: 60# lat. Pull downs with added shoulder flexion holds 22x.  Scap. Retraction 50# 15x2 in standing posture.  Staggered stance for balance.    Seated LAQ/ marching in gray chair 30x.  Discussed lower leg swelling/B knee pain and joint stiffness.   PATIENT EDUCATION: Education details: HEP/ daily activity Person educated: Patient Education method: Medical illustrator Education comprehension: verbalized understanding and returned demonstration   HOME EXERCISE PROGRAM: Handouts given for shoulder ROM/ strengthening/ standing hip ex.    PT Short Term Goals -  04/10/23      PT SHORT TERM GOAL #1   Title Pt able to complete and ambulate >800 feet with no assistive device safely.      Baseline 10/2:  620 feet.  HR: 65 bpm/ O2 sat. 95%. 2/19: 418 feet. HR: 67 bpm/ O2 sat.97%   Time 4    Period Weeks    Status Partially met          PT Long Term Goals -  05/08/2023      PT LONG TERM GOAL #1   Title Pt will improve FOTO score to 50 to display improvement in functional mobility.    Baseline 6/7: 33.  10/23: 52   Time 8   Period Weeks    Status Goal met on 10/23   Target Date 12/05/2022     PT LONG TERM GOAL #2   Title Pt wil improve TUG score to < 12 sec with no AD to display significant imporvement in reduced  risk of falls    Baseline 11/23: 26 sec (did not sit down immediately leading to higher time than he would've scored) 1/19: 14.44 sec.   3/7: 12.66 sec. 03/06/23: 11.41 seconds.    Time 8   Period Weeks    Status Partially Met    Target Date 05/08/2023     PT LONG TERM GOAL #3   Title Pt will improve DGI to >19/24 to indicate decreased risk of falls with household and community walking tasks    Baseline 11/23: 11/24 1/19:12/24    Time 8    Period Weeks    Status Partially Met    Target Date 05/08/2023     PT LONG TERM GOAL #4   Title Pt. Will increase R hip flexion/ LE muscle strength 1/2 muscle grade to improve standing tolerance/ walking endurance.    Baseline See above.  4/1: B hip flexion 4+/5 MMT (good control on R hip today).     Time 8    Period Weeks    Status Partially Met    Target Date 05/08/2023         Plan -     Clinical Impression Statement Pt. Arrived to PT with a stiff/antalgic gait using a SPC.  Pt. Remains limited with B knee pain and lower leg swelling/ edema.  Pt. Works hard during tx. Pt. Demonstrates significant antalgic gait due to high pain level in B LE's.  Pts. Legs felt heavy during walking tasks in //-bars and requires verbal cuing to increase hip flexion/ step length.  PT discussed importance of keeping active between PT sessions to help promote endurance. Pt. Demonstrates weakness in LE muscles affecting functional gait and  mobility.  PT encouraged pt. To purchase a rollator to improve safety/endurance with walking.  Pt. Is planning to have a ramp built off the front porch of his home.   Pt will continue to benefit from skilled PT services to progress strength and mobility, along with cardiovascular endurance.    Personal Factors and Comorbidities Comorbidity 2    Comorbidities Hypertension, Obesity    Examination-Activity Limitations Bed Mobility;Reach Overhead;Squat;Lift;Dressing;Hygiene/Grooming    Examination-Participation Restrictions Community Activity;Yard Work    Conservation officer, historic buildings Evolving/Moderate complexity    Clinical Decision Making Moderate    Rehab Potential Fair    PT Frequency 1x / week    PT Duration 8 weeks    PT Treatment/Interventions ADLs/Self Care Home Management;Gait training;Stair training;Functional mobility training;Therapeutic activities;Therapeutic exercise;Balance training;Neuromuscular re-education;Manual techniques;Patient/family education;Electrical Stimulation;Moist Heat;Cryotherapy    PT POC Reassess HEP/ before Nustep next tx.   PT Home Exercise Plan Progress endurance training to improve functional activity. Add sit to stand activity.      Consulted and Agree with Plan of Care Patient         Cammie Mcgee, PT, DPT # [id number] 04/19/2023, 6:18 PM

## 2023-04-24 ENCOUNTER — Ambulatory Visit: Payer: Medicare Other | Admitting: Physical Therapy

## 2023-04-25 ENCOUNTER — Encounter: Payer: Medicare Other | Admitting: Physical Therapy

## 2023-05-03 ENCOUNTER — Ambulatory Visit: Payer: Medicare Other | Admitting: Physical Therapy

## 2023-05-10 ENCOUNTER — Ambulatory Visit: Payer: Medicare Other | Attending: Family Medicine | Admitting: Physical Therapy

## 2023-05-10 DIAGNOSIS — R269 Unspecified abnormalities of gait and mobility: Secondary | ICD-10-CM | POA: Insufficient documentation

## 2023-05-10 DIAGNOSIS — R262 Difficulty in walking, not elsewhere classified: Secondary | ICD-10-CM | POA: Insufficient documentation

## 2023-05-10 DIAGNOSIS — R2681 Unsteadiness on feet: Secondary | ICD-10-CM | POA: Insufficient documentation

## 2023-05-10 DIAGNOSIS — R279 Unspecified lack of coordination: Secondary | ICD-10-CM | POA: Insufficient documentation

## 2023-05-10 DIAGNOSIS — M6281 Muscle weakness (generalized): Secondary | ICD-10-CM | POA: Diagnosis not present

## 2023-05-10 DIAGNOSIS — R2689 Other abnormalities of gait and mobility: Secondary | ICD-10-CM | POA: Insufficient documentation

## 2023-05-14 NOTE — Therapy (Signed)
OUTPATIENT PHYSICAL THERAPY TREATMENT/ RECERTIFICATION  Patient Name: Tom Underwood MRN: [id number] DOB:1951-06-09, 72 y.o., male Today's Date: 05/10/2023  PCP: Alba Cory, MD REFERRING PROVIDER: Caro Laroche, DO    PT End of Session - 05/14/23 1037     Visit Number 44    Number of Visits 56    Date for PT Re-Evaluation 08/02/23    PT Start Time 1110    PT Stop Time 1204    PT Time Calculation (min) 54 min    Equipment Utilized During Treatment Other (comment)    Activity Tolerance Patient tolerated treatment well;Patient limited by fatigue;Patient limited by pain    Behavior During Therapy WFL for tasks assessed/performed               Past Medical History:  Diagnosis Date   Anxiety    Arthritis    Atrial fibrillation (HCC)    a.) CHA2DS2-VASc = 4 (age, HTN, CVA x2). b.) s/p 200 J synchronized cardioversion (DCCV) on 12/08/2021. c.) rate/rythum maintained on oral metoprolol tartrate; chronically anticoagulated using rivaroxaban.   BPH (benign prostatic hyperplasia)    Complication of anesthesia    a.)  Intolerant of propofol; causes "skin burning and involuntary muscle twitching"   DDD (degenerative disc disease), lumbar    Decreased libido    Depression    Diverticulitis    Diverticulosis    GERD (gastroesophageal reflux disease)    Hepatic steatosis    History of 2019 novel coronavirus disease (COVID-19) 12/28/2020   History of cardiac murmur in childhood    History of kidney stones    HLD (hyperlipidemia)    HLD (hyperlipidemia)    Hypertension    Hypogonadism in male    Long term current use of anticoagulant    a.) rivaroxaban   OSA on CPAP    Pre-diabetes    Rhinitis, allergic    Stroke (HCC) 07/23/2019   a.) LEFT posterior cor radiata. b.) residual RIGHT sided weakness   Past Surgical History:  Procedure Laterality Date   CARDIOVERSION N/A 12/08/2021   Procedure: CARDIOVERSION;  Surgeon: Yvonne Kendall, MD;  Location: [facility] ORS;   Service: Cardiovascular;  Laterality: N/A;   COLONOSCOPY     EVALUATION UNDER ANESTHESIA WITH HEMORRHOIDECTOMY N/A 04/15/2022   Procedure: EXAM UNDER ANESTHESIA WITH HEMORRHOIDECTOMY;  Surgeon: Sung Amabile, DO;  Location: [facility] ORS;  Service: General;  Laterality: N/A;   EXTERNAL EAR SURGERY     EYE SURGERY     FINGER SURGERY Right    lateration to 5 th digit   KNEE SURGERY     left eye     RECTAL EXAM UNDER ANESTHESIA N/A 04/23/2022   Procedure: RECTAL EXAM UNDER ANESTHESIA WITH LIGATION OF BLEEDING;  Surgeon: Henrene Dodge, MD;  Location: [facility] ORS;  Service: General;  Laterality: N/A;   Patient Active Problem List   Diagnosis Date Noted   Gastroesophageal reflux disease without esophagitis 05/17/2022   Dysthymia 05/17/2022   History of syncope 05/17/2022   History of hemorrhoidectomy 05/17/2022   Vitamin D deficiency 05/17/2022   Lower extremity edema 05/17/2022   History of rectal bleeding 05/17/2022   Bleeding internal hemorrhoids    Morbid obesity (HCC)    Persistent atrial fibrillation (HCC) 07/14/2020   Hypertension    Hemiparesis of right dominant side as late effect of cerebral infarction (HCC) 08/13/2019   History of kidney stones 05/02/2018   ED (erectile dysfunction) 05/02/2018   Hyperglycemia 01/30/2017   Arthritis of knee, degenerative 01/30/2017  Benign prostatic hyperplasia without lower urinary tract symptoms 02/03/2016   Dyslipidemia 08/04/2015   Diverticulitis of large intestine without perforation or abscess without bleeding 06/30/2015   OSA on CPAP 06/30/2015    REFERRING DIAG: M25.511,M25.512 (ICD-10-CM) - Bilateral shoulder pain, unspecified chronicity M17.2 (ICD-10-CM) - Post-traumatic osteoarthritis of both knees  R26.81 (ICD-10-CM) - Gait instability   THERAPY DIAG:  Muscle weakness (generalized)   Unsteadiness on feet   Abnormality of gait and mobility   Unspecified lack of coordination   Other abnormalities of gait and mobility    Difficulty in walking, not elsewhere classified  PERTINENT HISTORY: See evaluation  PRECAUTIONS: Fall  10/2:  : 38 feet x 16 laps plus 12 feet (620 feet).  HR: 65 bpm/ O2 sat. 96%.  2/19: : 68 feet x 6laps plus 10 ft. = (418)  Post VS: HR- 67 O2 Sat: 97  SUBJECTIVE:  05/10/2023   Pt. Entered PT with use of SPC and significant B low leg swelling noted (R greater than L).  Pt. States his breathing has been better over past couple weeks.  Pt. C/o B knee joint pain with prolonged standing/ walking/ sit to stands.  PT reviewed POC with pt. And discussed importance of daily walking program/ possible gym based ex.    TODAY'S TREATMENT:   There Ex.: Nustep L5 10 min. B UE/LE.  Seat position #10/ arm position #10.  0.53 miles.     Walking in hallway 4.25 laps with limited use of SPC and cuing for proper BOS/ posture.  More consistent step pattern.  Pt. Has not discussed rollator with MD/ insurance at this time. Pt. Would benefit from bariatric rollator for home/ community ambulation.    TRX squats 22x. Chair behind patient for cue on squat depth. Pt. Cued to keep knees behind toes to avoid excess pressure on knee joint.   Moderate fatigue noted/ L UE tremors.    Standing Nautilus: 60# lat. Pull downs with added shoulder flexion holds 22x.  Scap. Retraction 50# 15x2 in standing posture.  Staggered stance for balance.    Seated LAQ/ marching in gray chair 30x.  Discussed lower leg swelling/B knee pain and joint stiffness.  Reassessment of goals/ upright posture during there.ex.  Mirror feedback for correction.     PATIENT EDUCATION: Education details: HEP/ daily activity Person educated: Patient Education method: Medical illustrator Education comprehension: verbalized understanding and returned demonstration   HOME EXERCISE PROGRAM: Handouts given for shoulder ROM/ strengthening/ standing hip ex.    PT Short Term Goals -  06/07/23      PT SHORT TERM GOAL #1    Title Pt able to complete and ambulate >800 feet with no assistive device safely.     Baseline 10/2:  620 feet.  HR: 65 bpm/ O2 sat. 95%. 2/19: 418 feet. HR: 67 bpm/ O2 sat.97%   Time 4    Period Weeks    Status Partially met          PT Long Term Goals -  08/02/2023      PT LONG TERM GOAL #1   Title Pt will improve FOTO score to 50 to display improvement in functional mobility.    Baseline 6/7: 33.  10/23: 52   Time 8   Period Weeks    Status Goal met on 10/23   Target Date 12/05/2022     PT LONG TERM GOAL #2   Title Pt wil improve TUG score to < 12 sec with no  AD to display significant imporvement in reduced risk of falls    Baseline 11/23: 26 sec (did not sit down immediately leading to higher time than he would've scored) 1/19: 14.44 sec.   3/7: 12.66 sec. 03/06/23: 11.41 seconds.    Time 8   Period Weeks    Status Partially Met    Target Date 05/08/2023     PT LONG TERM GOAL #3   Title Pt will improve DGI to >19/24 to indicate decreased risk of falls with household and community walking tasks    Baseline 11/23: 11/24 1/19:12/24    Time 8    Period Weeks    Status Partially Met    Target Date 05/08/2023     PT LONG TERM GOAL #4   Title Pt. Will increase R hip flexion/ LE muscle strength 1/2 muscle grade to improve standing tolerance/ walking endurance.    Baseline See above.  4/1: B hip flexion 4+/5 MMT (good control on R hip today).     Time 8    Period Weeks    Status Partially Met    Target Date 05/08/2023         Plan -     Clinical Impression Statement Pt. Arrived to PT with a stiff/antalgic gait using a SPC.  Pt. Remains limited with B knee pain and lower leg swelling/ edema.  Pt. Works hard during tx. Pt. Demonstrates significant antalgic gait due to high pain level in B LE's.  Pts. Legs felt heavy during walking tasks in //-bars and requires verbal cuing to increase hip flexion/ step length.  PT discussed importance of keeping active between PT sessions to help  promote endurance. Pt. Demonstrates weakness in LE muscles affecting functional gait and mobility.  PT encouraged pt. To purchase a rollator to improve safety/endurance with walking.  Pt. Is planning to have a ramp built off the front porch of his home.   Pt will continue to benefit from skilled PT services to progress strength and mobility, along with cardiovascular endurance.    Personal Factors and Comorbidities Comorbidity 2    Comorbidities Hypertension, Obesity    Examination-Activity Limitations Bed Mobility;Reach Overhead;Squat;Lift;Dressing;Hygiene/Grooming    Examination-Participation Restrictions Community Activity;Yard Work    Conservation officer, historic buildings Evolving/Moderate complexity    Clinical Decision Making Moderate    Rehab Potential Fair    PT Frequency 1x / week    PT Duration 12 weeks    PT Treatment/Interventions ADLs/Self Care Home Management;Gait training;Stair training;Functional mobility training;Therapeutic activities;Therapeutic exercise;Balance training;Neuromuscular re-education;Manual techniques;Patient/family education;Electrical Stimulation;Moist Heat;Cryotherapy    PT POC Reassess HEP/ before Nustep next tx.   PT Home Exercise Plan Progress endurance training to improve functional activity. Add sit to stand activity.      Consulted and Agree with Plan of Care Patient         Cammie Mcgee, PT, DPT # [phone] 05/14/2023, 10:41 AM

## 2023-05-15 ENCOUNTER — Ambulatory Visit: Payer: Medicare Other | Admitting: Physical Therapy

## 2023-05-15 ENCOUNTER — Encounter: Payer: Self-pay | Admitting: Physical Therapy

## 2023-05-15 DIAGNOSIS — R2681 Unsteadiness on feet: Secondary | ICD-10-CM

## 2023-05-15 DIAGNOSIS — R2689 Other abnormalities of gait and mobility: Secondary | ICD-10-CM

## 2023-05-15 DIAGNOSIS — R269 Unspecified abnormalities of gait and mobility: Secondary | ICD-10-CM | POA: Diagnosis not present

## 2023-05-15 DIAGNOSIS — M6281 Muscle weakness (generalized): Secondary | ICD-10-CM | POA: Diagnosis not present

## 2023-05-15 DIAGNOSIS — R262 Difficulty in walking, not elsewhere classified: Secondary | ICD-10-CM

## 2023-05-15 DIAGNOSIS — R279 Unspecified lack of coordination: Secondary | ICD-10-CM | POA: Diagnosis not present

## 2023-05-15 NOTE — Therapy (Signed)
OUTPATIENT PHYSICAL THERAPY TREATMENT  Patient Name: Tom Underwood MRN: [id number] DOB:[birth year], 72 y.o., male Today's Date: 05/15/2023  PCP: Alba Cory, MD REFERRING PROVIDER: Caro Laroche, DO    PT End of Session - 05/15/23 1102     Visit Number 45    Number of Visits 56    Date for PT Re-Evaluation 08/02/23    PT Start Time 1102    PT Stop Time 1204    PT Time Calculation (min) 62 min    Equipment Utilized During Treatment Other (comment)    Activity Tolerance Patient tolerated treatment well;Patient limited by fatigue;Patient limited by pain    Behavior During Therapy WFL for tasks assessed/performed               Past Medical History:  Diagnosis Date   Anxiety    Arthritis    Atrial fibrillation (HCC)    a.) CHA2DS2-VASc = 4 (age, HTN, CVA x2). b.) s/p 200 J synchronized cardioversion (DCCV) on 12/08/2021. c.) rate/rythum maintained on oral metoprolol tartrate; chronically anticoagulated using rivaroxaban.   BPH (benign prostatic hyperplasia)    Complication of anesthesia    a.)  Intolerant of propofol; causes "skin burning and involuntary muscle twitching"   DDD (degenerative disc disease), lumbar    Decreased libido    Depression    Diverticulitis    Diverticulosis    GERD (gastroesophageal reflux disease)    Hepatic steatosis    History of 2019 novel coronavirus disease (COVID-19) 12/28/2020   History of cardiac murmur in childhood    History of kidney stones    HLD (hyperlipidemia)    HLD (hyperlipidemia)    Hypertension    Hypogonadism in male    Long term current use of anticoagulant    a.) rivaroxaban   OSA on CPAP    Pre-diabetes    Rhinitis, allergic    Stroke (HCC) 07/23/2019   a.) LEFT posterior cor radiata. b.) residual RIGHT sided weakness   Past Surgical History:  Procedure Laterality Date   CARDIOVERSION N/A 12/08/2021   Procedure: CARDIOVERSION;  Surgeon: Yvonne Kendall, MD;  Location: [facility] OR[facility];  Service:  Cardiovascular;  Laterality: N/A;   COLONOSCOPY     EVALUATION UNDER ANESTHESIA WITH HEMORRHOIDECTOMY N/A 04/15/2022   Procedure: EXAM UNDER ANESTHESIA WITH HEMORRHOIDECTOMY;  Surgeon: Sung Amabile, DO;  Location: [facility];  Service: General;  Laterality: N/A;   EXTERNAL EAR SURGERY     EYE SURGERY     FINGER SURGERY Right    lateration to 5 th digit   KNEE SURGERY     left eye     RECTAL EXAM UNDER ANESTHESIA N/A 04/23/2022   Procedure: RECTAL EXAM UNDER ANESTHESIA WITH LIGATION OF BLEEDING;  Surgeon: Henrene Dodge, MD;  Location: [facility] ORS;  Service: General;  Laterality: N/A;   Patient Active Problem List   Diagnosis Date Noted   Gastroesophageal reflux disease without esophagitis 05/17/2022   Dysthymia 05/17/2022   History of syncope 05/17/2022   History of hemorrhoidectomy 05/17/2022   Vitamin D deficiency 05/17/2022   Lower extremity edema 05/17/2022   History of rectal bleeding 05/17/2022   Bleeding internal hemorrhoids    Morbid obesity (HCC)    Persistent atrial fibrillation (HCC) 07/14/2020   Hypertension    Hemiparesis of right dominant side as late effect of cerebral infarction (HCC) 08/13/2019   History of kidney stones 05/02/2018   ED (erectile dysfunction) 05/02/2018   Hyperglycemia 01/30/2017   Arthritis of knee, degenerative 01/30/2017   Benign  prostatic hyperplasia without lower urinary tract symptoms 02/03/2016   Dyslipidemia 08/04/2015   Diverticulitis of large intestine without perforation or abscess without bleeding 06/30/2015   OSA on CPAP 06/30/2015    REFERRING DIAG: M25.511,M25.512 (ICD-10-CM) - Bilateral shoulder pain, unspecified chronicity M17.2 (ICD-10-CM) - Post-traumatic osteoarthritis of both knees  R26.81 (ICD-10-CM) - Gait instability   THERAPY DIAG:  Muscle weakness (generalized)   Unsteadiness on feet   Abnormality of gait and mobility   Unspecified lack of coordination   Other abnormalities of gait and mobility   Difficulty in  walking, not elsewhere classified  PERTINENT HISTORY: See evaluation  PRECAUTIONS: Fall  10/2:  : 38 feet x 16 laps plus 12 feet (620 feet).  HR: 65 bpm/ O2 sat. 96%.  2/19: : 68 feet x 6laps plus 10 ft. = (418)  Post VS: HR- 67 O2 Sat: 97  SUBJECTIVE:  05/15/2023   Pt. Entered PT with use of SPC and significant B low leg swelling noted (R greater than L).  Pt. C/o B knee joint pain with prolonged standing/ walking/ sit to stands.  PT reviewed POC with pt. And discussed importance of daily walking program/ possible gym based ex.    TODAY'S TREATMENT:   There Ex.:  Nustep L5 10 min. B UE/LE.  Seat position #10/ arm position #10.  0.58 miles.  Increase cadence/ no increase c/o knee pain.  Moderate SOB noted.  O2: 93%, HR 71 bpm.       TRX squats 22x. Chair behind patient for cue on squat depth. Pt. Cued to keep knees behind toes to avoid excess pressure on knee joint.   Moderate fatigue noted/ L UE tremors.   Seated B shoulder flexion/ chest press 20x.  Upright posture correction in gray chair (use of wt. Wand).    Standing Nautilus: 60# lat. Pull downs with added shoulder flexion holds 22x.  Standing B shoulder extension 40# 22x (cuing to upright posture).  Staggered stance for balance.    Seated LAQ/ marching in gray chair 30x.  Discussed lower leg swelling/B knee pain and joint stiffness.  Walking in hallway/outside with limited use of SPC and cuing for proper BOS/ posture.  More consistent step pattern.  Pt. Has not discussed rollator with MD/ insurance at this time. Pt. Would benefit from bariatric rollator for home/ community ambulation.  Pt. States his wife is shopping for a rollator that accomodates his wt.      PATIENT EDUCATION: Education details: HEP/ daily activity Person educated: Patient Education method: Medical illustrator Education comprehension: verbalized understanding and returned demonstration   HOME EXERCISE PROGRAM: Handouts given  for shoulder ROM/ strengthening/ standing hip ex.    PT Short Term Goals -  06/07/23      PT SHORT TERM GOAL #1   Title Pt able to complete and ambulate >800 feet with no assistive device safely.     Baseline 10/2:  620 feet.  HR: 65 bpm/ O2 sat. 95%. 2/19: 418 feet. HR: 67 bpm/ O2 sat.97%   Time 4    Period Weeks    Status Partially met          PT Long Term Goals -  08/02/2023      PT LONG TERM GOAL #1   Title Pt will improve FOTO score to 50 to display improvement in functional mobility.    Baseline 6/7: 33.  10/23: 52   Time 8   Period Weeks    Status Goal met on  10/23   Target Date 12/05/2022     PT LONG TERM GOAL #2   Title Pt wil improve TUG score to < 12 sec with no AD to display significant imporvement in reduced risk of falls    Baseline 11/23: 26 sec (did not sit down immediately leading to higher time than he would've scored) 1/19: 14.44 sec.   3/7: 12.66 sec. 03/06/23: 11.41 seconds.    Time 8   Period Weeks    Status Partially Met    Target Date 05/08/2023     PT LONG TERM GOAL #3   Title Pt will improve DGI to >19/24 to indicate decreased risk of falls with household and community walking tasks    Baseline 11/23: 11/24 1/19:12/24    Time 8    Period Weeks    Status Partially Met    Target Date 05/08/2023     PT LONG TERM GOAL #4   Title Pt. Will increase R hip flexion/ LE muscle strength 1/2 muscle grade to improve standing tolerance/ walking endurance.    Baseline See above.  4/1: B hip flexion 4+/5 MMT (good control on R hip today).     Time 8    Period Weeks    Status Partially Met    Target Date 05/08/2023         Plan -     Clinical Impression Statement Pt. Arrived to PT with a stiff/antalgic gait using a SPC.  Pt. Remains limited with B knee pain and lower leg swelling/ edema.  Pt. Works hard during tx. Pt. Demonstrates significant antalgic gait due to high pain level in B LE's.  Pts. Legs felt heavy during walking tasks in //-bars and requires  verbal cuing to increase hip flexion/ step length.  PT discussed importance of keeping active between PT sessions to help promote endurance. Pt. Demonstrates weakness in LE muscles affecting functional gait and mobility.  PT encouraged pt. To purchase a rollator to improve safety/endurance with walking.  Pt. Is planning to have a ramp built off the front porch of his home.   Pt will continue to benefit from skilled PT services to progress strength and mobility, along with cardiovascular endurance.    Personal Factors and Comorbidities Comorbidity 2    Comorbidities Hypertension, Obesity    Examination-Activity Limitations Bed Mobility;Reach Overhead;Squat;Lift;Dressing;Hygiene/Grooming    Examination-Participation Restrictions Community Activity;Yard Work    Conservation officer, historic buildings Evolving/Moderate complexity    Clinical Decision Making Moderate    Rehab Potential Fair    PT Frequency 1x / week    PT Duration 12 weeks    PT Treatment/Interventions ADLs/Self Care Home Management;Gait training;Stair training;Functional mobility training;Therapeutic activities;Therapeutic exercise;Balance training;Neuromuscular re-education;Manual techniques;Patient/family education;Electrical Stimulation;Moist Heat;Cryotherapy    PT POC Reassess HEP/ before Nustep next tx.   PT Home Exercise Plan Progress endurance training to improve functional activity. Add sit to stand activity.      Consulted and Agree with Plan of Care Patient         Cammie Mcgee, PT, DPT # [phone] 05/15/2023, 6:15 PM

## 2023-05-23 ENCOUNTER — Ambulatory Visit: Payer: Medicare Other | Admitting: Physical Therapy

## 2023-05-23 ENCOUNTER — Other Ambulatory Visit: Payer: Self-pay | Admitting: Family Medicine

## 2023-05-23 DIAGNOSIS — R2681 Unsteadiness on feet: Secondary | ICD-10-CM

## 2023-05-23 DIAGNOSIS — R262 Difficulty in walking, not elsewhere classified: Secondary | ICD-10-CM

## 2023-05-23 DIAGNOSIS — M6281 Muscle weakness (generalized): Secondary | ICD-10-CM

## 2023-05-23 DIAGNOSIS — R269 Unspecified abnormalities of gait and mobility: Secondary | ICD-10-CM

## 2023-05-23 DIAGNOSIS — R2689 Other abnormalities of gait and mobility: Secondary | ICD-10-CM

## 2023-05-23 DIAGNOSIS — J302 Other seasonal allergic rhinitis: Secondary | ICD-10-CM
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
## 2023-05-24 ENCOUNTER — Encounter: Payer: Self-pay | Admitting: Family Medicine

## 2023-05-24 ENCOUNTER — Ambulatory Visit (INDEPENDENT_AMBULATORY_CARE_PROVIDER_SITE_OTHER): Payer: Medicare Other | Admitting: Family Medicine

## 2023-05-24 DIAGNOSIS — I4819 Other persistent atrial fibrillation: Secondary | ICD-10-CM | POA: Diagnosis not present

## 2023-05-24 DIAGNOSIS — R2681 Unsteadiness on feet: Secondary | ICD-10-CM | POA: Diagnosis not present

## 2023-05-24 DIAGNOSIS — R739 Hyperglycemia, unspecified: Secondary | ICD-10-CM | POA: Diagnosis not present

## 2023-05-24 DIAGNOSIS — M170 Bilateral primary osteoarthritis of knee: Secondary | ICD-10-CM | POA: Diagnosis not present

## 2023-05-24 DIAGNOSIS — E559 Vitamin D deficiency, unspecified: Secondary | ICD-10-CM | POA: Diagnosis not present

## 2023-05-24 DIAGNOSIS — N400 Enlarged prostate without lower urinary tract symptoms: Secondary | ICD-10-CM

## 2023-05-24 DIAGNOSIS — F341 Dysthymic disorder: Secondary | ICD-10-CM

## 2023-05-24 DIAGNOSIS — G4733 Obstructive sleep apnea (adult) (pediatric): Secondary | ICD-10-CM

## 2023-05-24 DIAGNOSIS — I69351 Hemiplegia and hemiparesis following cerebral infarction affecting right dominant side: Secondary | ICD-10-CM | POA: Diagnosis not present

## 2023-05-24 DIAGNOSIS — E785 Hyperlipidemia, unspecified: Secondary | ICD-10-CM

## 2023-05-24 DIAGNOSIS — Z862 Personal history of diseases of the blood and blood-forming organs and certain disorders involving the immune mechanism: Secondary | ICD-10-CM

## 2023-05-24 DIAGNOSIS — K219 Gastro-esophageal reflux disease without esophagitis: Secondary | ICD-10-CM | POA: Diagnosis not present

## 2023-05-24 MED ORDER — TAMSULOSIN HCL 0.4 MG PO CAPS: 0.4000 mg | ORAL_CAPSULE | Freq: Every day | ORAL | 1 refills | Status: DC

## 2023-05-24 MED ORDER — PANTOPRAZOLE SODIUM 20 MG PO TBEC: 20.0000 mg | DELAYED_RELEASE_TABLET | Freq: Every day | ORAL | 1 refills | Status: DC

## 2023-05-24 MED ORDER — OMEGA-3-ACID ETHYL ESTERS 1 G PO CAPS: 2.0000 g | ORAL_CAPSULE | Freq: Two times a day (BID) | ORAL | 1 refills | Status: DC

## 2023-05-24 MED ORDER — BUPROPION HCL ER (XL) 150 MG PO TB24: 150.0000 mg | ORAL_TABLET | Freq: Every morning | ORAL | 1 refills | Status: DC

## 2023-05-26 ENCOUNTER — Ambulatory Visit (INDEPENDENT_AMBULATORY_CARE_PROVIDER_SITE_OTHER): Payer: Medicare Other

## 2023-05-26 VITALS — Ht 70.0 in | Wt 307.0 lb

## 2023-05-26 DIAGNOSIS — Z0000 Encounter for general adult medical examination without abnormal findings: Secondary | ICD-10-CM | POA: Diagnosis not present

## 2023-05-26 DIAGNOSIS — Z1211 Encounter for screening for malignant neoplasm of colon: Secondary | ICD-10-CM

## 2023-05-26 NOTE — Progress Notes (Signed)
Subjective:   Tom Underwood is a 72 y.o. male who presents for Medicare Annual/Subsequent preventive examination.  Visit Complete: Virtual  I connected with  [name] on 05/26/23 by a audio enabled telemedicine application and verified that I am speaking with the correct person using two identifiers.  Patient Location: Home  Provider Location: Office/Clinic  I discussed the limitations of evaluation and management by telemedicine. The patient expressed understanding and agreed to proceed.  Patient Medicare AWV questionnaire was completed by the patient on (not done); I have confirmed that all information answered by patient is correct and no changes since this date.  Review of Systems    Cardiac Risk Factors include: advanced age (>41men, >62 women);dyslipidemia;male gender;hypertension;obesity (BMI >30kg/m2);sedentary lifestyle    Objective:    Today's Vitals   05/26/23 1332 05/26/23 1333  Weight: (!) 307 lb (139.3 kg)   Height: 5\' 10"  (1.778 m)   PainSc:  2    Body mass index is 44.05 kg/m.     05/26/2023    1:53 PM 05/17/2022   10:13 AM 04/23/2022    6:48 PM 04/15/2022    6:59 AM 04/07/2022    3:21 PM 08/31/2021    2:33 PM 04/22/2021    9:55 AM  Advanced Directives  Does Patient Have a Medical Advance Directive? Yes Yes No No Yes No Yes  Type of Estate agent of [place];Living will Healthcare Power of [employer] of [place];Living will  Copy of Healthcare Power of Attorney in Chart?  No - copy requested     No - copy requested  Would patient like information on creating a medical advance directive?   No - Patient declined No - Patient declined       Current Medications (verified) Outpatient Encounter Medications as of 05/26/2023  Medication Sig   amitriptyline (ELAVIL) 25 MG tablet TAKE 1 TABLET BY MOUTH AT BEDTIME AS NEEDED FOR SLEEP   aspirin EC 81 MG tablet Take 1 tablet (81 mg total) by mouth daily. Swallow whole.    buPROPion (WELLBUTRIN XL) 150 MG 24 hr tablet Take 1 tablet (150 mg total) by mouth every morning.   busPIRone (BUSPAR) 10 MG tablet Take 1 tablet (10 mg total) by mouth 2 (two) times daily.   Cholecalciferol (VITAMIN D) 125 MCG (5000 UT) CAPS Take 5,000 Units by mouth daily.   finasteride (PROSCAR) 5 MG tablet Take 1 tablet (5 mg total) by mouth daily.   fluticasone (FLONASE) 50 MCG/ACT nasal spray SPRAY 2 SPRAYS INTO EACH NOSTRIL EVERY DAY   GLUCOSAMINE-CHONDROITIN PO Take 2 tablets by mouth in the morning and at bedtime.   lidocaine (XYLOCAINE) 5 % ointment Apply 1 application. topically 3 (three) times daily as needed. Apply pea size amount to area as needed   methocarbamol (ROBAXIN) 500 MG tablet Take 500 mg by mouth every 8 (eight) hours as needed for muscle spasms.   metoprolol tartrate (LOPRESSOR) 25 MG tablet Take 1 tablet (25 mg total) by mouth 2 (two) times daily.   Multiple Vitamin (MULTIVITAMIN) tablet Take 1 tablet by mouth daily.   omega-3 acid ethyl esters (LOVAZA) 1 g capsule Take 2 capsules (2 g total) by mouth 2 (two) times daily.   pantoprazole (PROTONIX) 20 MG tablet Take 1 tablet (20 mg total) by mouth daily.   rosuvastatin (CRESTOR) 20 MG tablet Take 1 tablet (20 mg total) by mouth daily.   Saccharomyces boulardii (PROBIOTIC) 250 MG CAPS Take 1  capsule by mouth daily.   sildenafil (VIAGRA) 50 MG tablet    tamsulosin (FLOMAX) 0.4 MG CAPS capsule Take 1 capsule (0.4 mg total) by mouth daily.   valsartan (DIOVAN) 80 MG tablet Take 1 tablet (80 mg total) by mouth daily.   VISINE DRY EYE RELIEF 1 % SOLN Place 1 drop into both eyes daily as needed (dry eyes).   furosemide (LASIX) 20 MG tablet Take 1 tablet (20 mg total) by mouth as needed. For lower extremity swelling.   No facility-administered encounter medications on file as of 05/26/2023.    Allergies (verified) Penicillins   History: Past Medical History:  Diagnosis Date   Anxiety    Arthritis    Atrial  fibrillation (HCC)    a.) CHA2DS2-VASc = 4 (age, HTN, CVA x2). b.) s/p 200 J synchronized cardioversion (DCCV) on 12/08/2021. c.) rate/rythum maintained on oral metoprolol tartrate; chronically anticoagulated using rivaroxaban.   BPH (benign prostatic hyperplasia)    Complication of anesthesia    a.)  Intolerant of propofol; causes "skin burning and involuntary muscle twitching"   DDD (degenerative disc disease), lumbar    Decreased libido    Depression    Diverticulitis    Diverticulosis    GERD (gastroesophageal reflux disease)    Hepatic steatosis    History of 2019 novel coronavirus disease (COVID-19) 12/28/2020   History of cardiac murmur in childhood    History of kidney stones    HLD (hyperlipidemia)    HLD (hyperlipidemia)    Hypertension    Hypogonadism in male    Long term current use of anticoagulant    a.) rivaroxaban   OSA on CPAP    Pre-diabetes    Rhinitis, allergic    Stroke (HCC) 07/23/2019   a.) LEFT posterior cor radiata. b.) residual RIGHT sided weakness   Past Surgical History:  Procedure Laterality Date   CARDIOVERSION N/A 12/08/2021   Procedure: CARDIOVERSION;  Surgeon: Yvonne Kendall, MD;  Location: [facility] ORS;  Service: Cardiovascular;  Laterality: N/A;   COLONOSCOPY     EVALUATION UNDER ANESTHESIA WITH HEMORRHOIDECTOMY N/A 04/15/2022   Procedure: EXAM UNDER ANESTHESIA WITH HEMORRHOIDECTOMY;  Surgeon: Sung Amabile, DO;  Location: [facility] ORS;  Service: General;  Laterality: N/A;   EXTERNAL EAR SURGERY     EYE SURGERY     FINGER SURGERY Right    lateration to 5 th digit   KNEE SURGERY     left eye     RECTAL EXAM UNDER ANESTHESIA N/A 04/23/2022   Procedure: RECTAL EXAM UNDER ANESTHESIA WITH LIGATION OF BLEEDING;  Surgeon: Henrene Dodge, MD;  Location: [facility] ORS;  Service: General;  Laterality: N/A;   Family History  Problem Relation Age of Onset   Asthma Mother    Heart disease Mother    Heart attack Mother    Dementia Mother    Asthma Father     Heart disease Father    Stroke Father    Social History   Socioeconomic History   Marital status: Married    Spouse name: [name]    Number of children: 2   Years of education: Not on file   Highest education level: Not on file  Occupational History   Occupation: retired   Tobacco Use   Smoking status: Former    Types: Pipe    Start date: 02/20/1973    Quit date: 02/20/1974    Years since quitting: 49.2   Smokeless tobacco: Never  Vaping Use   Vaping Use: Never used  Substance and Sexual Activity   Alcohol use: No    Alcohol/week: 0.0 standard drinks of alcohol   Drug use: No   Sexual activity: Yes    Partners: Female  Other Topics Concern   Not on file  Social History Narrative   Not on file   Social Determinants of Health   Financial Resource Strain: Low Risk  (05/26/2023)   Overall Financial Resource Strain (CARDIA)    Difficulty of Paying Living Expenses: Not very hard  Food Insecurity: No Food Insecurity (05/26/2023)   Hunger Vital Sign    Worried About Running Out of Food in the Last Year: Never true    Ran Out of Food in the Last Year: Never true  Transportation Needs: No Transportation Needs (05/26/2023)   PRAPARE - Administrator, Civil Service (Medical): No    Lack of Transportation (Non-Medical): No  Physical Activity: Insufficiently Active (05/26/2023)   Exercise Vital Sign    Days of Exercise per Week: 3 days    Minutes of Exercise per Session: 30 min  Stress: No Stress Concern Present (05/26/2023)   [employer] of Occupational Health - Occupational Stress Questionnaire    Feeling of Stress : Not at all  Social Connections: Socially Integrated (05/26/2023)   Social Connection and Isolation Panel [NHANES]    Frequency of Communication with Friends and Family: More than three times a week    Frequency of Social Gatherings with Friends and Family: Twice a week    Attends Religious Services: More than 4 times per year    Active Member of [employer]  or Organizations: Yes    Attends Engineer, structural: More than 4 times per year    Marital Status: Married    Tobacco Counseling Counseling given: Not Answered   Clinical Intake:  Pre-visit preparation completed: Yes  Pain : 0-10 Pain Score: 2  Pain Type: Acute pain (headache when sneeze) Pain Location: Head Pain Descriptors / Indicators: Aching Pain Onset: Yesterday Pain Frequency: Intermittent Pain Relieving Factors: tylenol, nasal spray  Pain Relieving Factors: tylenol, nasal spray  BMI - recorded: 44.05 Nutritional Status: BMI > 30  Obese Nutritional Risks: None Diabetes: No  How often do you need to have someone help you when you read instructions, pamphlets, or other written materials from your doctor or pharmacy?: 1 - Never  Interpreter Needed?: No  Comments: lives with wife Information entered by :: B.Seab Axel,LPN   Activities of Daily Living    05/26/2023    1:53 PM 05/24/2023   10:58 AM  In your present state of health, do you have any difficulty performing the following activities:  Hearing? 0 0  Vision? 0 0  Difficulty concentrating or making decisions? 0 0  Walking or climbing stairs?  1  Dressing or bathing? 0 0  Doing errands, shopping? 0 0  Preparing Food and eating ? N   Using the Toilet? N   In the past six months, have you accidently leaked urine? N   Do you have problems with loss of bowel control? Y   Managing your Medications? N   Managing your Finances? N   Housekeeping or managing your Housekeeping? Y     Patient Care Team: Alba Cory, MD as PCP - General (Family Medicine) Debbe Odea, MD as PCP - Cardiology (Cardiology) Lanier Prude, MD as PCP - Electrophysiology (Cardiology) Helane Gunther, DPM as Consulting Physician (Podiatry) Marvel Plan, Elana Alm as Physician Assistant (Urology) Gaspar Cola, [facility] (Inactive) (Pharmacist)  Lonell Face, MD as Consulting Physician (Neurology) Sung Amabile, DO as Consulting Physician (Surgery) Cammie Mcgee, PT as Physical Therapist (Physical Therapy)  Indicate any recent Medical Services you may have received from other than [facility] providers in the past year (date may be approximate).     Assessment:   This is a routine wellness examination for [place].  Hearing/Vision screen Hearing Screening - Comments:: Adequate hearing Vision Screening - Comments:: Adequate vision w/glasses [facility]  Dietary issues and exercise activities discussed:     Goals Addressed               This Visit's Progress     Patient Stated (pt-stated)   On track     Patient would like to increase strength and mobility over the next year while continuing to recover from stroke. Patient has a goal to walk one mile and bicycle one mile.        Depression Screen    05/26/2023    1:41 PM 05/24/2023   11:03 AM 02/16/2023   11:27 AM 11/15/2022   10:17 AM 08/24/2022   10:40 AM 05/17/2022   10:36 AM 05/17/2022   10:11 AM  PHQ 2/9 Scores  PHQ - 2 Score 2 0 0 0 1 2 2   PHQ- 9 Score 4 3 0 0 1 3 3     Fall Risk    05/26/2023    1:36 PM 05/24/2023   10:57 AM 02/16/2023   11:02 AM 11/15/2022   10:17 AM 08/24/2022   10:40 AM  Fall Risk   Falls in the past year? 0 0 0 0 0  Number falls in past yr: 0   0 0  Injury with Fall? 0   0 0  Risk for fall due to : No Fall Risks Impaired balance/gait Impaired balance/gait Impaired balance/gait Impaired mobility;Impaired balance/gait  Follow up Falls prevention discussed;Education provided Falls prevention discussed;Falls evaluation completed;Education provided Falls prevention discussed;Education provided;Falls evaluation completed Education provided;Falls evaluation completed;Falls prevention discussed Falls prevention discussed    MEDICARE RISK AT HOME:  Medicare Risk at Home - 05/26/23 1337     Any stairs in or around the home? Yes    If so, are there any without handrails? Yes    Home free of loose throw  rugs in walkways, pet beds, electrical cords, etc? Yes    Adequate lighting in your home to reduce risk of falls? Yes    Life alert? No    Use of a cane, walker or w/c? No   cane   Grab bars in the bathroom? Yes    Shower chair or bench in shower? Yes    Elevated toilet seat or a handicapped toilet? Yes             TIMED UP AND GO:  Was the test performed?  No    Cognitive Function:        05/26/2023    1:54 PM 04/21/2020    9:43 AM  6CIT Screen  What Year? 0 points 0 points  What month? 0 points 0 points  What time? 0 points 0 points  Count back from 20 0 points 0 points  Months in reverse 0 points 0 points  Repeat phrase 0 points 0 points  Total Score 0 points 0 points    Immunizations Immunization History  Administered Date(s) Administered   Fluad Quad(high Dose 65+) 09/18/2019, 11/10/2020, 10/18/2021, 08/24/2022   Influenza, High Dose Seasonal PF 10/05/2018   Influenza,inj,Quad PF,6+  Mos 08/30/2017   Influenza-Unspecified 09/26/2016   Moderna Covid-19 Vaccine Bivalent Booster 47yrs & up 03/31/2022   Moderna Sars-Covid-2 Vaccination 01/21/2020, 02/18/2020, 11/23/2020   Pneumococcal Conjugate-13 11/05/2019   Pneumococcal Polysaccharide-23 10/19/2018   Tdap 02/16/2018, 07/14/2020   Zoster, Live 02/24/2017    TDAP status: Up to date  Flu Vaccine status: Up to date  Pneumococcal vaccine status: Up to date  Covid-19 vaccine status: Completed vaccines  Qualifies for Shingles Vaccine? Yes   Zostavax completed No   Shingrix Completed?: No.    Education has been provided regarding the importance of this vaccine. Patient has been advised to call insurance company to determine out of pocket expense if they have not yet received this vaccine. Advised may also receive vaccine at local pharmacy or Health Dept. Verbalized acceptance and understanding.  Screening Tests Health Maintenance  Topic Date Due   COVID-19 Vaccine (5 - 2023-24 season) 06/09/2023 (Originally  08/05/2022)   Zoster Vaccines- Shingrix (1 of 2) 08/23/2023 (Originally 03/18/1970)   INFLUENZA VACCINE  07/06/2023   Colonoscopy  09/27/2023   Medicare Annual Wellness (AWV)  05/25/2024   DTaP/Tdap/Td (3 - Td or Tdap) 07/14/2030   Pneumonia Vaccine 23+ Years old  Completed   Hepatitis C Screening  Completed   HPV VACCINES  Aged Out    Health Maintenance  There are no preventive care reminders to display for this patient.   Colorectal cancer screening: Referral to GI placed yes. Pt aware the office will call re: appt.  Lung Cancer Screening: (Low Dose CT Chest recommended if Age 26-80 years, 20 pack-year currently smoking OR have quit w/in 15years.) does not qualify.   Lung Cancer Screening Referral: no  Additional Screening:  Hepatitis C Screening: does not qualify; Completed yes  Vision Screening: Recommended annual ophthalmology exams for early detection of glaucoma and other disorders of the eye. Is the patient up to date with their annual eye exam?  Yes  Who is the provider or what is the name of the office in which the patient attends annual eye exams? [facility] If pt is not established with a provider, would they like to be referred to a provider to establish care? No .   Dental Screening: Recommended annual dental exams for proper oral hygiene  Diabetic Foot Exam: Diabetic Foot Exam: Overdue, Pt has been advised about the importance in completing this exam. Pt is scheduled for diabetic foot exam on 11/12/2023.  Community Resource Referral / Chronic Care Management: CRR required this visit?  No   CCM required this visit?  No     Plan:     I have personally reviewed and noted the following in the patient's chart:   Medical and social history Use of alcohol, tobacco or illicit drugs  Current medications and supplements including opioid prescriptions. Patient is not currently taking opioid prescriptions. Functional ability and status Nutritional status Physical  activity Advanced directives List of other physicians Hospitalizations, surgeries, and ER visits in previous 12 months Vitals Screenings to include cognitive, depression, and falls Referrals and appointments  In addition, I have reviewed and discussed with patient certain preventive protocols, quality metrics, and best practice recommendations. A written personalized care plan for preventive services as well as general preventive health recommendations were provided to patient.     Sue Lush, LPN   9/56/2130   After Visit Summary: (MyChart) Due to this being a telephonic visit, the after visit summary with patients personalized plan was offered to patient via MyChart  Nurse Notes: Pt relays he is still recovering from a stroke 4 years ago. He is still doing PT exercises 3xweekly (2 at home;1 in office). He relays he is has contacted a provider at [facility] for counseling (depression) and anticipates appt soon.  *Referral made for colonoscopy after 09/27/2023

## 2023-05-26 NOTE — Patient Instructions (Signed)
Tom Underwood , Thank you for taking time to come for your Medicare Wellness Visit. I appreciate your ongoing commitment to your health goals. Please review the following plan we discussed and let me know if I can assist you in the future.   These are the goals we discussed:  Goals       Patient Stated (pt-stated)      Patient would like to increase strength and mobility over the next year while continuing to recover from stroke. Patient has a goal to walk one mile and bicycle one mile.       Track and Manage My Blood Pressure-Hypertension      Timeframe:  Long-Range Goal Priority:  High Start Date:  07/22/2021                            Expected End Date:  07/23/2023                     Follow Up within 90 days   - check blood pressure weekly    Why is this important?   You won't feel high blood pressure, but it can still hurt your blood vessels.  High blood pressure can cause heart or kidney problems. It can also cause a stroke.  Making lifestyle changes like losing a little weight or eating less salt will help.  Checking your blood pressure at home and at different times of the day can help to control blood pressure.  If the doctor prescribes medicine remember to take it the way the doctor ordered.  Call the office if you cannot afford the medicine or if there are questions about it.     Notes:         This is a list of the screening recommended for you and due dates:  Health Maintenance  Topic Date Due   COVID-19 Vaccine (5 - 2023-24 season) 06/09/2023*   Zoster (Shingles) Vaccine (1 of 2) 08/23/2023*   Flu Shot  07/06/2023   Colon Cancer Screening  09/27/2023   Medicare Annual Wellness Visit  05/25/2024   DTaP/Tdap/Td vaccine (3 - Td or Tdap) 07/14/2030   Pneumonia Vaccine  Completed   Hepatitis C Screening  Completed   HPV Vaccine  Aged Out  *Topic was postponed. The date shown is not the original due date.    Advanced directives: yes  Conditions/risks identified:  low falls risk  Next appointment: Follow up in one year for your annual wellness visit. 05/31/2023 @1 :30pm telephone  Preventive Care 65 Years and Older, Male  Preventive care refers to lifestyle choices and visits with your health care provider that can promote health and wellness. What does preventive care include? A yearly physical exam. This is also called an annual well check. Dental exams once or twice a year. Routine eye exams. Ask your health care provider how often you should have your eyes checked. Personal lifestyle choices, including: Daily care of your teeth and gums. Regular physical activity. Eating a healthy diet. Avoiding tobacco and drug use. Limiting alcohol use. Practicing safe sex. Taking low doses of aspirin every day. Taking vitamin and mineral supplements as recommended by your health care provider. What happens during an annual well check? The services and screenings done by your health care provider during your annual well check will depend on your age, overall health, lifestyle risk factors, and family history of disease. Counseling  Your health care provider may ask  you questions about your: Alcohol use. Tobacco use. Drug use. Emotional well-being. Home and relationship well-being. Sexual activity. Eating habits. History of falls. Memory and ability to understand (cognition). Work and work Astronomer. Screening  You may have the following tests or measurements: Height, weight, and BMI. Blood pressure. Lipid and cholesterol levels. These may be checked every 5 years, or more frequently if you are over 80 years old. Skin check. Lung cancer screening. You may have this screening every year starting at age 74 if you have a 30-pack-year history of smoking and currently smoke or have quit within the past 15 years. Fecal occult blood test (FOBT) of the stool. You may have this test every year starting at age 70. Flexible sigmoidoscopy or colonoscopy.  You may have a sigmoidoscopy every 5 years or a colonoscopy every 10 years starting at age 66. Prostate cancer screening. Recommendations will vary depending on your family history and other risks. Hepatitis C blood test. Hepatitis B blood test. Sexually transmitted disease (STD) testing. Diabetes screening. This is done by checking your blood sugar (glucose) after you have not eaten for a while (fasting). You may have this done every 1-3 years. Abdominal aortic aneurysm (AAA) screening. You may need this if you are a current or former smoker. Osteoporosis. You may be screened starting at age 44 if you are at high risk. Talk with your health care provider about your test results, treatment options, and if necessary, the need for more tests. Vaccines  Your health care provider may recommend certain vaccines, such as: Influenza vaccine. This is recommended every year. Tetanus, diphtheria, and acellular pertussis (Tdap, Td) vaccine. You may need a Td booster every 10 years. Zoster vaccine. You may need this after age 26. Pneumococcal 13-valent conjugate (PCV13) vaccine. One dose is recommended after age 41. Pneumococcal polysaccharide (PPSV23) vaccine. One dose is recommended after age 11. Talk to your health care provider about which screenings and vaccines you need and how often you need them. This information is not intended to replace advice given to you by your health care provider. Make sure you discuss any questions you have with your health care provider. Document Released: 12/18/2015 Document Revised: 08/10/2016 Document Reviewed: 09/22/2015 Elsevier Interactive Patient Education  2017 [employer].  Fall Prevention in the Home Falls can cause injuries. They can happen to people of all ages. There are many things you can do to make your home safe and to help prevent falls. What can I do on the outside of my home? Regularly fix the edges of walkways and driveways and fix any  cracks. Remove anything that might make you trip as you walk through a door, such as a raised step or threshold. Trim any bushes or trees on the path to your home. Use bright outdoor lighting. Clear any walking paths of anything that might make someone trip, such as rocks or tools. Regularly check to see if handrails are loose or broken. Make sure that both sides of any steps have handrails. Any raised decks and porches should have guardrails on the edges. Have any leaves, snow, or ice cleared regularly. Use sand or salt on walking paths during winter. Clean up any spills in your garage right away. This includes oil or grease spills. What can I do in the bathroom? Use night lights. Install grab bars by the toilet and in the tub and shower. Do not use towel bars as grab bars. Use non-skid mats or decals in the tub or shower. If  you need to sit down in the shower, use a plastic, non-slip stool. Keep the floor dry. Clean up any water that spills on the floor as soon as it happens. Remove soap buildup in the tub or shower regularly. Attach bath mats securely with double-sided non-slip rug tape. Do not have throw rugs and other things on the floor that can make you trip. What can I do in the bedroom? Use night lights. Make sure that you have a light by your bed that is easy to reach. Do not use any sheets or blankets that are too big for your bed. They should not hang down onto the floor. Have a firm chair that has side arms. You can use this for support while you get dressed. Do not have throw rugs and other things on the floor that can make you trip. What can I do in the kitchen? Clean up any spills right away. Avoid walking on wet floors. Keep items that you use a lot in easy-to-reach places. If you need to reach something above you, use a strong step stool that has a grab bar. Keep electrical cords out of the way. Do not use floor polish or wax that makes floors slippery. If you must  use wax, use non-skid floor wax. Do not have throw rugs and other things on the floor that can make you trip. What can I do with my stairs? Do not leave any items on the stairs. Make sure that there are handrails on both sides of the stairs and use them. Fix handrails that are broken or loose. Make sure that handrails are as long as the stairways. Check any carpeting to make sure that it is firmly attached to the stairs. Fix any carpet that is loose or worn. Avoid having throw rugs at the top or bottom of the stairs. If you do have throw rugs, attach them to the floor with carpet tape. Make sure that you have a light switch at the top of the stairs and the bottom of the stairs. If you do not have them, ask someone to add them for you. What else can I do to help prevent falls? Wear shoes that: Do not have high heels. Have rubber bottoms. Are comfortable and fit you well. Are closed at the toe. Do not wear sandals. If you use a stepladder: Make sure that it is fully opened. Do not climb a closed stepladder. Make sure that both sides of the stepladder are locked into place. Ask someone to hold it for you, if possible. Clearly mark and make sure that you can see: Any grab bars or handrails. First and last steps. Where the edge of each step is. Use tools that help you move around (mobility aids) if they are needed. These include: Canes. Walkers. Scooters. Crutches. Turn on the lights when you go into a dark area. Replace any light bulbs as soon as they burn out. Set up your furniture so you have a clear path. Avoid moving your furniture around. If any of your floors are uneven, fix them. If there are any pets around you, be aware of where they are. Review your medicines with your doctor. Some medicines can make you feel dizzy. This can increase your chance of falling. Ask your doctor what other things that you can do to help prevent falls. This information is not intended to replace  advice given to you by your health care provider. Make sure you discuss any questions you have with  your health care provider. Document Released: 09/17/2009 Document Revised: 04/28/2016 Document Reviewed: 12/26/2014 Elsevier Interactive Patient Education  2017 [employer].

## 2023-05-29 ENCOUNTER — Ambulatory Visit: Payer: Medicare Other

## 2023-05-29 ENCOUNTER — Encounter: Payer: Self-pay | Admitting: Physical Therapy

## 2023-05-29 DIAGNOSIS — M6281 Muscle weakness (generalized): Secondary | ICD-10-CM | POA: Diagnosis not present

## 2023-05-29 DIAGNOSIS — R2681 Unsteadiness on feet: Secondary | ICD-10-CM

## 2023-05-29 DIAGNOSIS — R262 Difficulty in walking, not elsewhere classified: Secondary | ICD-10-CM | POA: Diagnosis not present

## 2023-05-29 DIAGNOSIS — R279 Unspecified lack of coordination: Secondary | ICD-10-CM

## 2023-05-29 DIAGNOSIS — R2689 Other abnormalities of gait and mobility: Secondary | ICD-10-CM | POA: Diagnosis not present

## 2023-05-29 DIAGNOSIS — R269 Unspecified abnormalities of gait and mobility: Secondary | ICD-10-CM | POA: Diagnosis not present

## 2023-05-29 NOTE — Therapy (Signed)
OUTPATIENT PHYSICAL THERAPY TREATMENT  Patient Name: Tom Underwood MRN: [id number] DOB:January 20, 1951, 72 y.o., male Today's Date: 05/29/2023  PCP: Alba Cory, MD REFERRING PROVIDER: Caro Laroche, DO    PT End of Session - 05/29/23 1339     Visit Number 46    Number of Visits 56    Date for PT Re-Evaluation 08/02/23    PT Start Time 1342    PT Stop Time 1432    PT Time Calculation (min) 50 min    Equipment Utilized During Treatment Other (comment)    Activity Tolerance Patient tolerated treatment well;Patient limited by fatigue;Patient limited by pain    Behavior During Therapy WFL for tasks assessed/performed                Past Medical History:  Diagnosis Date   Anxiety    Arthritis    Atrial fibrillation (HCC)    a.) CHA2DS2-VASc = 4 (age, HTN, CVA x2). b.) s/p 200 J synchronized cardioversion (DCCV) on 12/08/2021. c.) rate/rythum maintained on oral metoprolol tartrate; chronically anticoagulated using rivaroxaban.   BPH (benign prostatic hyperplasia)    Complication of anesthesia    a.)  Intolerant of propofol; causes "skin burning and involuntary muscle twitching"   DDD (degenerative disc disease), lumbar    Decreased libido    Depression    Diverticulitis    Diverticulosis    GERD (gastroesophageal reflux disease)    Hepatic steatosis    History of 2019 novel coronavirus disease (COVID-19) 12/28/2020   History of cardiac murmur in childhood    History of kidney stones    HLD (hyperlipidemia)    HLD (hyperlipidemia)    Hypertension    Hypogonadism in male    Long term current use of anticoagulant    a.) rivaroxaban   OSA on CPAP    Pre-diabetes    Rhinitis, allergic    Stroke (HCC) 07/23/2019   a.) LEFT posterior cor radiata. b.) residual RIGHT sided weakness   Past Surgical History:  Procedure Laterality Date   CARDIOVERSION N/A 12/08/2021   Procedure: CARDIOVERSION;  Surgeon: Yvonne Kendall, MD;  Location: [facility] ORS;  Service:  Cardiovascular;  Laterality: N/A;   COLONOSCOPY     EVALUATION UNDER ANESTHESIA WITH HEMORRHOIDECTOMY N/A 04/15/2022   Procedure: EXAM UNDER ANESTHESIA WITH HEMORRHOIDECTOMY;  Surgeon: Sung Amabile, DO;  Location: [facility] ORS;  Service: General;  Laterality: N/A;   EXTERNAL EAR SURGERY     EYE SURGERY     FINGER SURGERY Right    lateration to 5 th digit   KNEE SURGERY     left eye     RECTAL EXAM UNDER ANESTHESIA N/A 04/23/2022   Procedure: RECTAL EXAM UNDER ANESTHESIA WITH LIGATION OF BLEEDING;  Surgeon: Henrene Dodge, MD;  Location: [facility] ORS;  Service: General;  Laterality: N/A;   Patient Active Problem List   Diagnosis Date Noted   Gastroesophageal reflux disease without esophagitis 05/17/2022   Dysthymia 05/17/2022   History of syncope 05/17/2022   History of hemorrhoidectomy 05/17/2022   Vitamin D deficiency 05/17/2022   Lower extremity edema 05/17/2022   History of rectal bleeding 05/17/2022   Bleeding internal hemorrhoids    Morbid obesity (HCC)    Persistent atrial fibrillation (HCC) 07/14/2020   Hypertension    Hemiparesis of right dominant side as late effect of cerebral infarction (HCC) 08/13/2019   History of kidney stones 05/02/2018   ED (erectile dysfunction) 05/02/2018   Hyperglycemia 01/30/2017   Arthritis of knee, degenerative 01/30/2017  Benign prostatic hyperplasia without lower urinary tract symptoms 02/03/2016   Dyslipidemia 08/04/2015   Diverticulitis of large intestine without perforation or abscess without bleeding 06/30/2015   OSA on CPAP 06/30/2015    REFERRING DIAG: M25.511,M25.512 (ICD-10-CM) - Bilateral shoulder pain, unspecified chronicity M17.2 (ICD-10-CM) - Post-traumatic osteoarthritis of both knees  R26.81 (ICD-10-CM) - Gait instability   THERAPY DIAG:  Muscle weakness (generalized)   Unsteadiness on feet   Abnormality of gait and mobility   Unspecified lack of coordination   Other abnormalities of gait and mobility   Difficulty in  walking, not elsewhere classified  PERTINENT HISTORY: See evaluation  PRECAUTIONS: Fall  10/2:  : 38 feet x 16 laps plus 12 feet (620 feet).  HR: 65 bpm/ O2 sat. 96%.  2/19: : 68 feet x 6laps plus 10 ft. = (418)  Post VS: HR- 67 O2 Sat: 97  SUBJECTIVE:  05/29/2023  Patient enters PT with use of SPC and significant B lower leg swelling. Complaining of B knee joint pain (L>R) and rating >9/10. Has been walking around the house this morning and feeling more fatigued. Continues to look for rollator but has not purchased one.   TODAY'S TREATMENT:   There Ex.:  Nustep L5-6 10 min. B UE/LE.  Seat position #10/ arm position #10.  0.60 miles.  Increase cadence/ no increase c/o knee pain.  Moderate SOB noted.  O2: 93%, HR 81 bpm.       TRX squats 22x. Chair behind patient for cue on squat depth. Pt. Cued to keep knees behind toes to avoid excess pressure on knee joint.   Moderate fatigue noted/ L UE tremors.   Seated B overhead press &  forward chest press 2 x 10 with 4# DB.  Upright posture correction in gray chair .    Seated LAQ/ marching in gray chair 30x.  Discussed lower leg swelling/B knee pain and joint stiffness.  Standing Nautilus: 60# lat. Pull downs with added shoulder flexion holds 22x.  Standing B shoulder extension 40# 22x (cuing to upright posture).  Staggered stance for balance.(Not today)  Walking in hallway/outside with limited use of SPC and cuing for proper BOS/ posture.  More consistent step pattern.  Pt. Has not discussed rollator with MD/ insurance at this time. Pt. Would benefit from bariatric rollator for home/ community ambulation.  Pt. States his wife is shopping for a rollator that accomodates his wt. (Not today)    PATIENT EDUCATION: Education details: HEP/ daily activity Person educated: Patient Education method: Medical illustrator Education comprehension: verbalized understanding and returned demonstration   HOME EXERCISE  PROGRAM: Handouts given for shoulder ROM/ strengthening/ standing hip ex.    PT Short Term Goals -  06/07/23      PT SHORT TERM GOAL #1   Title Pt able to complete and ambulate >800 feet with no assistive device safely.     Baseline 10/2:  620 feet.  HR: 65 bpm/ O2 sat. 95%. 2/19: 418 feet. HR: 67 bpm/ O2 sat.97%   Time 4    Period Weeks    Status Partially met          PT Long Term Goals -  08/02/2023      PT LONG TERM GOAL #1   Title Pt will improve FOTO score to 50 to display improvement in functional mobility.    Baseline 6/7: 33.  10/23: 52   Time 8   Period Weeks    Status Goal met on 10/23  Target Date 12/05/2022     PT LONG TERM GOAL #2   Title Pt wil improve TUG score to < 12 sec with no AD to display significant imporvement in reduced risk of falls    Baseline 11/23: 26 sec (did not sit down immediately leading to higher time than he would've scored) 1/19: 14.44 sec.   3/7: 12.66 sec. 03/06/23: 11.41 seconds.    Time 8   Period Weeks    Status Partially Met    Target Date 05/08/2023     PT LONG TERM GOAL #3   Title Pt will improve DGI to >19/24 to indicate decreased risk of falls with household and community walking tasks    Baseline 11/23: 11/24 1/19:12/24    Time 8    Period Weeks    Status Partially Met    Target Date 05/08/2023     PT LONG TERM GOAL #4   Title Pt. Will increase R hip flexion/ LE muscle strength 1/2 muscle grade to improve standing tolerance/ walking endurance.    Baseline See above.  4/1: B hip flexion 4+/5 MMT (good control on R hip today).     Time 8    Period Weeks    Status Partially Met    Target Date 05/08/2023         Plan -     Clinical Impression Statement Patient arrives to session with antalgic gait pattern using SPC. Continues to be limited by B knee pain and lower leg swelling. 3/4 DOE noted with ambulation/prolonged standing activities. Session focused on endurance training and LE strengthening. Encouraged patient to  purchase rollator to improve safety/endurance with walking. Patient will continue to benefit from skilled PT services to progress strength and mobility, along with cardiovascular endurance.      Personal Factors and Comorbidities Comorbidity 2    Comorbidities Hypertension, Obesity    Examination-Activity Limitations Bed Mobility;Reach Overhead;Squat;Lift;Dressing;Hygiene/Grooming    Examination-Participation Restrictions Community Activity;Yard Work    Conservation officer, historic buildings Evolving/Moderate complexity    Clinical Decision Making Moderate    Rehab Potential Fair    PT Frequency 1x / week    PT Duration 12 weeks    PT Treatment/Interventions ADLs/Self Care Home Management;Gait training;Stair training;Functional mobility training;Therapeutic activities;Therapeutic exercise;Balance training;Neuromuscular re-education;Manual techniques;Patient/family education;Electrical Stimulation;Moist Heat;Cryotherapy    PT POC Reassess HEP/ before Nustep next tx.   PT Home Exercise Plan Progress endurance training to improve functional activity. Add sit to stand activity.      Consulted and Agree with Plan of Care Patient         Maylon Peppers, PT, DPT Physical Therapist - [facility]  [facility]  05/29/2023, 2:33 PM

## 2023-06-02 ENCOUNTER — Encounter: Payer: Self-pay | Admitting: *Deleted

## 2023-06-12 ENCOUNTER — Ambulatory Visit: Payer: Medicare Other | Attending: Family Medicine

## 2023-06-12 ENCOUNTER — Encounter: Payer: Self-pay | Admitting: Physical Therapy

## 2023-06-12 DIAGNOSIS — R2681 Unsteadiness on feet: Secondary | ICD-10-CM | POA: Insufficient documentation

## 2023-06-12 DIAGNOSIS — R262 Difficulty in walking, not elsewhere classified: Secondary | ICD-10-CM | POA: Insufficient documentation

## 2023-06-12 DIAGNOSIS — R279 Unspecified lack of coordination: Secondary | ICD-10-CM | POA: Insufficient documentation

## 2023-06-12 DIAGNOSIS — R269 Unspecified abnormalities of gait and mobility: Secondary | ICD-10-CM | POA: Diagnosis not present

## 2023-06-12 DIAGNOSIS — M6281 Muscle weakness (generalized): Secondary | ICD-10-CM | POA: Insufficient documentation

## 2023-06-12 NOTE — Therapy (Signed)
OUTPATIENT PHYSICAL THERAPY TREATMENT  Patient Name: DEITRICH DECLEENE MRN: [id number] DOB:11/29/1951, 72 y.o., male Today's Date: 06/12/2023  PCP: Alba Cory, MD REFERRING PROVIDER: Caro Laroche, DO    PT End of Session - 06/12/23 1345     Visit Number 47    Number of Visits 56    Date for PT Re-Evaluation 08/02/23    PT Start Time 1345    PT Stop Time 1427    PT Time Calculation (min) 42 min    Equipment Utilized During Treatment Other (comment)    Activity Tolerance Patient tolerated treatment well;Patient limited by fatigue;Patient limited by pain    Behavior During Therapy WFL for tasks assessed/performed                 Past Medical History:  Diagnosis Date   Anxiety    Arthritis    Atrial fibrillation (HCC)    a.) CHA2DS2-VASc = 4 (age, HTN, CVA x2). b.) s/p 200 J synchronized cardioversion (DCCV) on 12/08/2021. c.) rate/rythum maintained on oral metoprolol tartrate; chronically anticoagulated using rivaroxaban.   BPH (benign prostatic hyperplasia)    Complication of anesthesia    a.)  Intolerant of propofol; causes "skin burning and involuntary muscle twitching"   DDD (degenerative disc disease), lumbar    Decreased libido    Depression    Diverticulitis    Diverticulosis    GERD (gastroesophageal reflux disease)    Hepatic steatosis    History of 2019 novel coronavirus disease (COVID-19) 12/28/2020   History of cardiac murmur in childhood    History of kidney stones    HLD (hyperlipidemia)    HLD (hyperlipidemia)    Hypertension    Hypogonadism in male    Long term current use of anticoagulant    a.) rivaroxaban   OSA on CPAP    Pre-diabetes    Rhinitis, allergic    Stroke (HCC) 07/23/2019   a.) LEFT posterior cor radiata. b.) residual RIGHT sided weakness   Past Surgical History:  Procedure Laterality Date   CARDIOVERSION N/A 12/08/2021   Procedure: CARDIOVERSION;  Surgeon: Yvonne Kendall, MD;  Location: [facility];  Service:  Cardiovascular;  Laterality: N/A;   COLONOSCOPY     EVALUATION UNDER ANESTHESIA WITH HEMORRHOIDECTOMY N/A 04/15/2022   Procedure: EXAM UNDER ANESTHESIA WITH HEMORRHOIDECTOMY;  Surgeon: Sung Amabile, DO;  Location: [facility];  Service: General;  Laterality: N/A;   EXTERNAL EAR SURGERY     EYE SURGERY     FINGER SURGERY Right    lateration to 5 th digit   KNEE SURGERY     left eye     RECTAL EXAM UNDER ANESTHESIA N/A 04/23/2022   Procedure: RECTAL EXAM UNDER ANESTHESIA WITH LIGATION OF BLEEDING;  Surgeon: Henrene Dodge, MD;  Location: [facility];  Service: General;  Laterality: N/A;   Patient Active Problem List   Diagnosis Date Noted   Gastroesophageal reflux disease without esophagitis 05/17/2022   Dysthymia 05/17/2022   History of syncope 05/17/2022   History of hemorrhoidectomy 05/17/2022   Vitamin D deficiency 05/17/2022   Lower extremity edema 05/17/2022   History of rectal bleeding 05/17/2022   Bleeding internal hemorrhoids    Morbid obesity (HCC)    Persistent atrial fibrillation (HCC) 07/14/2020   Hypertension    Hemiparesis of right dominant side as late effect of cerebral infarction (HCC) 08/13/2019   History of kidney stones 05/02/2018   ED (erectile dysfunction) 05/02/2018   Hyperglycemia 01/30/2017   Arthritis of knee, degenerative 01/30/2017  Benign prostatic hyperplasia without lower urinary tract symptoms 02/03/2016   Dyslipidemia 08/04/2015   Diverticulitis of large intestine without perforation or abscess without bleeding 06/30/2015   OSA on CPAP 06/30/2015    REFERRING DIAG: M25.511,M25.512 (ICD-10-CM) - Bilateral shoulder pain, unspecified chronicity M17.2 (ICD-10-CM) - Post-traumatic osteoarthritis of both knees  R26.81 (ICD-10-CM) - Gait instability   THERAPY DIAG:  Muscle weakness (generalized)   Unsteadiness on feet   Abnormality of gait and mobility   Unspecified lack of coordination   Other abnormalities of gait and mobility   Difficulty in  walking, not elsewhere classified  PERTINENT HISTORY: See evaluation  PRECAUTIONS: Fall  10/2:  : 38 feet x 16 laps plus 12 feet (620 feet).  HR: 65 bpm/ O2 sat. 96%.  2/19: : 68 feet x 6laps plus 10 ft. = (418)  Post VS: HR- 67 O2 Sat: 97  SUBJECTIVE:  06/12/2023    Patient enters PT with use of SPC and significant B lower leg swelling. Reports L LE pain >10/10 and R LE pain 9/10. Reports going [place] the mountains over the weekend and being active. Reports has not purchased rollator yet and is waiting for Medicare to purchase.   TODAY'S TREATMENT:   There Ex.:  Nustep L5-6 10 min. B UE/LE.  Seat position #10/ arm position #10.  0.60 miles.  Increase cadence/no increase c/o knee pain.  Moderate SOB noted.  O2: 94%, HR 89 bpm.       TRX squats 22x. Chair behind patient for cue on squat depth. Pt. Cued to keep knees behind toes to avoid excess pressure on knee joint.   Moderate fatigue noted/ L UE tremors.   Seated B overhead press &  forward chest press 2 x 10 with 4# DB.  Upright posture correction in gray chair .    Seated LAQ/marching in gray chair 30x with 2# AW.   Seated hip adduction ball squeeze x 20   Standing Nautilus: 60# lat. Pull downs with added shoulder flexion holds 22x.  Standing B shoulder extension 40# 22x (cuing to upright posture).  Staggered stance for balance.(Not today)   PATIENT EDUCATION: Education details: HEP/ daily activity Person educated: Patient Education method: Medical illustrator Education comprehension: verbalized understanding and returned demonstration   HOME EXERCISE PROGRAM: Handouts given for shoulder ROM/ strengthening/ standing hip ex.    PT Short Term Goals -  06/07/23      PT SHORT TERM GOAL #1   Title Pt able to complete and ambulate >800 feet with no assistive device safely.     Baseline 10/2:  620 feet.  HR: 65 bpm/ O2 sat. 95%. 2/19: 418 feet. HR: 67 bpm/ O2 sat.97%   Time 4    Period Weeks    Status  Partially met          PT Long Term Goals -  08/02/2023      PT LONG TERM GOAL #1   Title Pt will improve FOTO score to 50 to display improvement in functional mobility.    Baseline 6/7: 33.  10/23: 52   Time 8   Period Weeks    Status Goal met on 10/23   Target Date 12/05/2022     PT LONG TERM GOAL #2   Title Pt wil improve TUG score to < 12 sec with no AD to display significant imporvement in reduced risk of falls    Baseline 11/23: 26 sec (did not sit down immediately leading to higher time  than he would've scored) 1/19: 14.44 sec.   3/7: 12.66 sec. 03/06/23: 11.41 seconds.    Time 8   Period Weeks    Status Partially Met    Target Date 05/08/2023     PT LONG TERM GOAL #3   Title Pt will improve DGI to >19/24 to indicate decreased risk of falls with household and community walking tasks    Baseline 11/23: 11/24 1/19:12/24    Time 8    Period Weeks    Status Partially Met    Target Date 05/08/2023     PT LONG TERM GOAL #4   Title Pt. Will increase R hip flexion/ LE muscle strength 1/2 muscle grade to improve standing tolerance/ walking endurance.    Baseline See above.  4/1: B hip flexion 4+/5 MMT (good control on R hip today).     Time 8    Period Weeks    Status Partially Met    Target Date 05/08/2023         Plan -     Clinical Impression Statement Patient arrives to session with antalgic gait pattern using SPC. Continues to be limited by B knee pain and lower leg swelling. 3/4 DOE noted with ambulation/prolonged standing activities. Session focused on endurance training and LE strengthening. Patient is still waiting on purchasing rollator through Medicare. Patient will continue to benefit from skilled PT services to progress strength and mobility, along with cardiovascular endurance.      Personal Factors and Comorbidities Comorbidity 2    Comorbidities Hypertension, Obesity    Examination-Activity Limitations Bed Mobility;Reach  Overhead;Squat;Lift;Dressing;Hygiene/Grooming    Examination-Participation Restrictions Community Activity;Yard Work    Conservation officer, historic buildings Evolving/Moderate complexity    Clinical Decision Making Moderate    Rehab Potential Fair    PT Frequency 1x / week    PT Duration 12 weeks    PT Treatment/Interventions ADLs/Self Care Home Management;Gait training;Stair training;Functional mobility training;Therapeutic activities;Therapeutic exercise;Balance training;Neuromuscular re-education;Manual techniques;Patient/family education;Electrical Stimulation;Moist Heat;Cryotherapy    PT POC Reassess HEP/ before Nustep next tx.   PT Home Exercise Plan Progress endurance training to improve functional activity. Add sit to stand activity.      Consulted and Agree with Plan of Care Patient         Maylon Peppers, PT, DPT Physical Therapist - [facility]  06/12/2023, 2:27 PM

## 2023-06-13 ENCOUNTER — Telehealth: Payer: Self-pay

## 2023-06-13 NOTE — Telephone Encounter (Signed)
Patient has colonoscopy referral, however he has cardiac history A-Fib, GI History of diverticulitis.  Has some concerns regarding anesthesia due to a previous experience after having a hemorrhoidectomy.  Office visit has been scheduled to see Tom Underwood, 07/31/23.  Tom Underwood would like to have his colonoscopy with Dr. Tobi Bastos.  Thanks,  Marcelino Duster CMA

## 2023-06-20 ENCOUNTER — Ambulatory Visit: Payer: Medicare Other | Admitting: Physical Therapy

## 2023-06-20 ENCOUNTER — Encounter: Payer: Self-pay | Admitting: Physical Therapy

## 2023-06-20 DIAGNOSIS — R279 Unspecified lack of coordination: Secondary | ICD-10-CM

## 2023-06-20 DIAGNOSIS — R262 Difficulty in walking, not elsewhere classified: Secondary | ICD-10-CM

## 2023-06-20 DIAGNOSIS — R269 Unspecified abnormalities of gait and mobility: Secondary | ICD-10-CM | POA: Diagnosis not present

## 2023-06-20 DIAGNOSIS — M6281 Muscle weakness (generalized): Secondary | ICD-10-CM

## 2023-06-20 DIAGNOSIS — R2681 Unsteadiness on feet: Secondary | ICD-10-CM

## 2023-06-20 NOTE — Therapy (Signed)
OUTPATIENT PHYSICAL THERAPY TREATMENT  Patient Name: Tom Underwood MRN: [id number] DOB:03/08/1951, 72 y.o., male Today's Date: 06/20/2023  PCP: Alba Cory, MD REFERRING PROVIDER: Caro Laroche, DO    PT End of Session - 06/20/23 1354     Visit Number 48    Number of Visits 56    Date for PT Re-Evaluation 08/02/23    PT Start Time 1345    Equipment Utilized During Treatment Other (comment)    Activity Tolerance Patient tolerated treatment well;Patient limited by fatigue;Patient limited by pain    Behavior During Therapy [facility] for tasks assessed/performed                 Past Medical History:  Diagnosis Date   Anxiety    Arthritis    Atrial fibrillation (HCC)    a.) CHA2DS2-VASc = 4 (age, HTN, CVA x2). b.) s/p 200 J synchronized cardioversion (DCCV) on 12/08/2021. c.) rate/rythum maintained on oral metoprolol tartrate; chronically anticoagulated using rivaroxaban.   BPH (benign prostatic hyperplasia)    Complication of anesthesia    a.)  Intolerant of propofol; causes "skin burning and involuntary muscle twitching"   DDD (degenerative disc disease), lumbar    Decreased libido    Depression    Diverticulitis    Diverticulosis    GERD (gastroesophageal reflux disease)    Hepatic steatosis    History of 2019 novel coronavirus disease (COVID-19) 12/28/2020   History of cardiac murmur in childhood    History of kidney stones    HLD (hyperlipidemia)    HLD (hyperlipidemia)    Hypertension    Hypogonadism in male    Long term current use of anticoagulant    a.) rivaroxaban   OSA on CPAP    Pre-diabetes    Rhinitis, allergic    Stroke (HCC) 07/23/2019   a.) LEFT posterior cor radiata. b.) residual RIGHT sided weakness   Past Surgical History:  Procedure Laterality Date   CARDIOVERSION N/A 12/08/2021   Procedure: CARDIOVERSION;  Surgeon: Yvonne Kendall, MD;  Location: [facility];  Service: Cardiovascular;  Laterality: N/A;   COLONOSCOPY      EVALUATION UNDER ANESTHESIA WITH HEMORRHOIDECTOMY N/A 04/15/2022   Procedure: EXAM UNDER ANESTHESIA WITH HEMORRHOIDECTOMY;  Surgeon: Sung Amabile, DO;  Location: [facility];  Service: General;  Laterality: N/A;   EXTERNAL EAR SURGERY     EYE SURGERY     FINGER SURGERY Right    lateration to 5 th digit   KNEE SURGERY     left eye     RECTAL EXAM UNDER ANESTHESIA N/A 04/23/2022   Procedure: RECTAL EXAM UNDER ANESTHESIA WITH LIGATION OF BLEEDING;  Surgeon: Henrene Dodge, MD;  Location: [facility];  Service: General;  Laterality: N/A;   Patient Active Problem List   Diagnosis Date Noted   Gastroesophageal reflux disease without esophagitis 05/17/2022   Dysthymia 05/17/2022   History of syncope 05/17/2022   History of hemorrhoidectomy 05/17/2022   Vitamin D deficiency 05/17/2022   Lower extremity edema 05/17/2022   History of rectal bleeding 05/17/2022   Bleeding internal hemorrhoids    Morbid obesity (HCC)    Persistent atrial fibrillation (HCC) 07/14/2020   Hypertension    Hemiparesis of right dominant side as late effect of cerebral infarction (HCC) 08/13/2019   History of kidney stones 05/02/2018   ED (erectile dysfunction) 05/02/2018   Hyperglycemia 01/30/2017   Arthritis of knee, degenerative 01/30/2017   Benign prostatic hyperplasia without lower urinary tract symptoms 02/03/2016   Dyslipidemia 08/04/2015  Diverticulitis of large intestine without perforation or abscess without bleeding 06/30/2015   OSA on CPAP 06/30/2015    REFERRING DIAG: M25.511,M25.512 (ICD-10-CM) - Bilateral shoulder pain, unspecified chronicity M17.2 (ICD-10-CM) - Post-traumatic osteoarthritis of both knees  R26.81 (ICD-10-CM) - Gait instability   THERAPY DIAG:  Muscle weakness (generalized)   Unsteadiness on feet   Abnormality of gait and mobility   Unspecified lack of coordination   Other abnormalities of gait and mobility   Difficulty in walking, not elsewhere classified  PERTINENT HISTORY:  See evaluation  PRECAUTIONS: Fall  10/2:  : 38 feet x 16 laps plus 12 feet (620 feet).  HR: 65 bpm/ O2 sat. 96%.  2/19: : 68 feet x 6laps plus 10 ft. = (418)  Post VS: HR- 67 O2 Sat: 97  SUBJECTIVE:  06/20/2023     Patient enters PT with use of SPC and significant B lower leg swelling. Reports L LE pain >10/10 and R LE pain 9/10.  Pt. Reports no recent LOB and had a nice trip to the mountains with wife.  Pt. has not purchased rollator yet and is waiting for Medicare to purchase.   TODAY'S TREATMENT:   There Ex.:  Nustep L5-6 10 min. B UE/LE.  Seat position #10/ arm position #10.  0.60 miles.  Increase cadence/no increase c/o knee pain.  Moderate SOB noted.  O2: 94%, HR 79 bpm.         Walking in //-bar: forward/backwards/lateral 5x each with no UE assist.  O2: 93%, HR 74 bpm.  1 seated break prior to completing lateral walking.    TRX squats 22x. Chair behind patient for cue on squat depth. Pt. Cued to keep knees behind toes to avoid excess pressure on knee joint.   Moderate fatigue noted/ L UE tremors.   Standing Nautilus: 60# lat. Pull downs with added shoulder flexion holds 22x.  Standing B shoulder extension 40# 22x (cuing to upright posture).  Staggered stance for balance.  PATIENT EDUCATION: Education details: HEP/ daily activity Person educated: Patient Education method: Medical illustrator Education comprehension: verbalized understanding and returned demonstration   HOME EXERCISE PROGRAM: Handouts given for shoulder ROM/ strengthening/ standing hip ex.    PT Short Term Goals -  06/07/23      PT SHORT TERM GOAL #1   Title Pt able to complete and ambulate >800 feet with no assistive device safely.     Baseline 10/2:  620 feet.  HR: 65 bpm/ O2 sat. 95%. 2/19: 418 feet. HR: 67 bpm/ O2 sat.97%   Time 4    Period Weeks    Status Partially met          PT Long Term Goals -  08/02/2023      PT LONG TERM GOAL #1   Title Pt will improve FOTO  score to 50 to display improvement in functional mobility.    Baseline 6/7: 33.  10/23: 52   Time 8   Period Weeks    Status Goal met on 10/23   Target Date 12/05/2022     PT LONG TERM GOAL #2   Title Pt wil improve TUG score to < 12 sec with no AD to display significant imporvement in reduced risk of falls    Baseline 11/23: 26 sec (did not sit down immediately leading to higher time than he would've scored) 1/19: 14.44 sec.   3/7: 12.66 sec. 03/06/23: 11.41 seconds.    Time 8   Period Weeks  Status Partially Met    Target Date 05/08/2023     PT LONG TERM GOAL #3   Title Pt will improve DGI to >19/24 to indicate decreased risk of falls with household and community walking tasks    Baseline 11/23: 11/24 1/19:12/24    Time 8    Period Weeks    Status Partially Met    Target Date 05/08/2023     PT LONG TERM GOAL #4   Title Pt. Will increase R hip flexion/ LE muscle strength 1/2 muscle grade to improve standing tolerance/ walking endurance.    Baseline See above.  4/1: B hip flexion 4+/5 MMT (good control on R hip today).     Time 8    Period Weeks    Status Partially Met    Target Date 05/08/2023         Plan -     Clinical Impression Statement Patient arrives to session with antalgic gait pattern using SPC. Continues to be limited by B knee pain and lower leg swelling. 3/4 DOE noted with ambulation/prolonged standing activities. Session focused on endurance training and LE strengthening. Patient is still waiting on purchasing rollator through Medicare. Patient will continue to benefit from skilled PT services to progress strength and mobility, along with cardiovascular endurance.      Personal Factors and Comorbidities Comorbidity 2    Comorbidities Hypertension, Obesity    Examination-Activity Limitations Bed Mobility;Reach Overhead;Squat;Lift;Dressing;Hygiene/Grooming    Examination-Participation Restrictions Community Activity;Yard Work    Conservation officer, historic buildings  Evolving/Moderate complexity    Clinical Decision Making Moderate    Rehab Potential Fair    PT Frequency 1x / week    PT Duration 12 weeks    PT Treatment/Interventions ADLs/Self Care Home Management;Gait training;Stair training;Functional mobility training;Therapeutic activities;Therapeutic exercise;Balance training;Neuromuscular re-education;Manual techniques;Patient/family education;Electrical Stimulation;Moist Heat;Cryotherapy    PT POC Reassess HEP/ before Nustep next tx.   PT Home Exercise Plan Progress endurance training to improve functional activity. Add sit to stand activity.      Consulted and Agree with Plan of Care Patient         Cammie Mcgee, PT, DPT # [phone] Physical Therapist - [facility]  06/20/2023, 1:55 PM

## 2023-06-22 ENCOUNTER — Encounter: Payer: Self-pay | Admitting: Pharmacist

## 2023-06-22 NOTE — Progress Notes (Signed)
Patient previously followed by [facility] pharmacist. Per clinical review, no pharmacist appointment needed at this time.

## 2023-06-27 ENCOUNTER — Ambulatory Visit: Payer: Medicare Other | Admitting: Physical Therapy

## 2023-06-28 DIAGNOSIS — M170 Bilateral primary osteoarthritis of knee: Secondary | ICD-10-CM | POA: Diagnosis not present

## 2023-06-30 ENCOUNTER — Ambulatory Visit: Payer: Medicare Other | Admitting: Physical Therapy

## 2023-06-30 DIAGNOSIS — I69351 Hemiplegia and hemiparesis following cerebral infarction affecting right dominant side: Secondary | ICD-10-CM | POA: Diagnosis not present

## 2023-06-30 DIAGNOSIS — E559 Vitamin D deficiency, unspecified: Secondary | ICD-10-CM | POA: Diagnosis not present

## 2023-06-30 DIAGNOSIS — N400 Enlarged prostate without lower urinary tract symptoms: Secondary | ICD-10-CM | POA: Diagnosis not present

## 2023-06-30 DIAGNOSIS — R739 Hyperglycemia, unspecified: Secondary | ICD-10-CM | POA: Diagnosis not present

## 2023-06-30 DIAGNOSIS — Z862 Personal history of diseases of the blood and blood-forming organs and certain disorders involving the immune mechanism: Secondary | ICD-10-CM | POA: Diagnosis not present

## 2023-07-03 ENCOUNTER — Encounter: Payer: Self-pay | Admitting: Family Medicine

## 2023-07-04 ENCOUNTER — Encounter: Payer: Self-pay | Admitting: Physical Therapy

## 2023-07-04 ENCOUNTER — Ambulatory Visit: Payer: Medicare Other | Admitting: Physical Therapy

## 2023-07-04 DIAGNOSIS — R262 Difficulty in walking, not elsewhere classified: Secondary | ICD-10-CM

## 2023-07-04 DIAGNOSIS — M6281 Muscle weakness (generalized): Secondary | ICD-10-CM | POA: Diagnosis not present

## 2023-07-04 DIAGNOSIS — R279 Unspecified lack of coordination: Secondary | ICD-10-CM | POA: Diagnosis not present

## 2023-07-04 DIAGNOSIS — R269 Unspecified abnormalities of gait and mobility: Secondary | ICD-10-CM

## 2023-07-04 DIAGNOSIS — R2681 Unsteadiness on feet: Secondary | ICD-10-CM

## 2023-07-04 NOTE — Therapy (Signed)
OUTPATIENT PHYSICAL THERAPY TREATMENT  Patient Name: Tom Underwood MRN: [id number] DOB:07-14-1951, 72 y.o., male Today's Date: 07/04/2023  PCP: Alba Cory, MD REFERRING PROVIDER: Caro Laroche, DO    PT End of Session - 07/04/23 1355     Visit Number 49    Number of Visits 56    Date for PT Re-Evaluation 08/02/23    PT Start Time 1344    PT Stop Time 1458    PT Time Calculation (min) 74 min    Equipment Utilized During Treatment Other (comment)    Activity Tolerance Patient tolerated treatment well;Patient limited by fatigue;Patient limited by pain    Behavior During Therapy WFL for tasks assessed/performed               Past Medical History:  Diagnosis Date   Anxiety    Arthritis    Atrial fibrillation (HCC)    a.) CHA2DS2-VASc = 4 (age, HTN, CVA x2). b.) s/p 200 J synchronized cardioversion (DCCV) on 12/08/2021. c.) rate/rythum maintained on oral metoprolol tartrate; chronically anticoagulated using rivaroxaban.   BPH (benign prostatic hyperplasia)    Complication of anesthesia    a.)  Intolerant of propofol; causes "skin burning and involuntary muscle twitching"   DDD (degenerative disc disease), lumbar    Decreased libido    Depression    Diverticulitis    Diverticulosis    GERD (gastroesophageal reflux disease)    Hepatic steatosis    History of 2019 novel coronavirus disease (COVID-19) 12/28/2020   History of cardiac murmur in childhood    History of kidney stones    HLD (hyperlipidemia)    HLD (hyperlipidemia)    Hypertension    Hypogonadism in male    Long term current use of anticoagulant    a.) rivaroxaban   OSA on CPAP    Pre-diabetes    Rhinitis, allergic    Stroke (HCC) 07/23/2019   a.) LEFT posterior cor radiata. b.) residual RIGHT sided weakness   Past Surgical History:  Procedure Laterality Date   CARDIOVERSION N/A 12/08/2021   Procedure: CARDIOVERSION;  Surgeon: Yvonne Kendall, MD;  Location: [facility];  Service:  Cardiovascular;  Laterality: N/A;   COLONOSCOPY     EVALUATION UNDER ANESTHESIA WITH HEMORRHOIDECTOMY N/A 04/15/2022   Procedure: EXAM UNDER ANESTHESIA WITH HEMORRHOIDECTOMY;  Surgeon: Sung Amabile, DO;  Location: [facility];  Service: General;  Laterality: N/A;   EXTERNAL EAR SURGERY     EYE SURGERY     FINGER SURGERY Right    lateration to 5 th digit   KNEE SURGERY     left eye     RECTAL EXAM UNDER ANESTHESIA N/A 04/23/2022   Procedure: RECTAL EXAM UNDER ANESTHESIA WITH LIGATION OF BLEEDING;  Surgeon: Henrene Dodge, MD;  Location: [facility];  Service: General;  Laterality: N/A;   Patient Active Problem List   Diagnosis Date Noted   Gastroesophageal reflux disease without esophagitis 05/17/2022   Dysthymia 05/17/2022   History of syncope 05/17/2022   History of hemorrhoidectomy 05/17/2022   Vitamin D deficiency 05/17/2022   Lower extremity edema 05/17/2022   History of rectal bleeding 05/17/2022   Bleeding internal hemorrhoids    Morbid obesity (HCC)    Persistent atrial fibrillation (HCC) 07/14/2020   Hypertension    Hemiparesis of right dominant side as late effect of cerebral infarction (HCC) 08/13/2019   History of kidney stones 05/02/2018   ED (erectile dysfunction) 05/02/2018   Hyperglycemia 01/30/2017   Arthritis of knee, degenerative 01/30/2017   Benign  prostatic hyperplasia without lower urinary tract symptoms 02/03/2016   Dyslipidemia 08/04/2015   Diverticulitis of large intestine without perforation or abscess without bleeding 06/30/2015   OSA on CPAP 06/30/2015    REFERRING DIAG: M25.511,M25.512 (ICD-10-CM) - Bilateral shoulder pain, unspecified chronicity M17.2 (ICD-10-CM) - Post-traumatic osteoarthritis of both knees  R26.81 (ICD-10-CM) - Gait instability   THERAPY DIAG:  Muscle weakness (generalized)   Unsteadiness on feet   Abnormality of gait and mobility   Unspecified lack of coordination   Other abnormalities of gait and mobility   Difficulty in  walking, not elsewhere classified  PERTINENT HISTORY: See evaluation  PRECAUTIONS: Fall  10/2:  : 38 feet x 16 laps plus 12 feet (620 feet).  HR: 65 bpm/ O2 sat. 96%.  2/19: : 68 feet x 6laps plus 10 ft. = (418)  Post VS: HR- 67 O2 Sat: 97  SUBJECTIVE:  07/04/2023     Patient enters PT with use of SPC and significant B lower leg swelling. Reports L LE pain >12/10 and R LE pain 10/10.  No falls or new complaints.    TODAY'S TREATMENT:   There Ex.:  Nustep L5-6 10 min. B UE/LE.  Seat position #10/ arm position #10.  0.60 miles.  Increase cadence/no increase c/o knee pain.  Moderate SOB noted.  O2: 94%, HR 80 bpm.         TRX squats 22x. Chair behind patient for cue on squat depth. Pt. Cued to keep knees behind toes to avoid excess pressure on knee joint.   Moderate fatigue noted/ L UE tremors.   Standing Nautilus: 60# lat. Pull downs with added shoulder flexion holds 22x.  Standing B shoulder extension 40# 22x (cuing to upright posture).  Staggered stance for balance.  Walking in clinic/ //-bars with high marching (cuing for controlled breathing/ upright posture).    PATIENT EDUCATION: Education details: HEP/ daily activity Person educated: Patient Education method: Medical illustrator Education comprehension: verbalized understanding and returned demonstration   HOME EXERCISE PROGRAM: Handouts given for shoulder ROM/ strengthening/ standing hip ex.    PT Short Term Goals -  06/07/23      PT SHORT TERM GOAL #1   Title Pt able to complete and ambulate >800 feet with no assistive device safely.     Baseline 10/2:  620 feet.  HR: 65 bpm/ O2 sat. 95%. 2/19: 418 feet. HR: 67 bpm/ O2 sat.97%   Time 4    Period Weeks    Status Partially met          PT Long Term Goals -  08/02/2023      PT LONG TERM GOAL #1   Title Pt will improve FOTO score to 50 to display improvement in functional mobility.    Baseline 6/7: 33.  10/23: 52   Time 8   Period  Weeks    Status Goal met on 10/23   Target Date 12/05/2022     PT LONG TERM GOAL #2   Title Pt wil improve TUG score to < 12 sec with no AD to display significant imporvement in reduced risk of falls    Baseline 11/23: 26 sec (did not sit down immediately leading to higher time than he would've scored) 1/19: 14.44 sec.   3/7: 12.66 sec. 03/06/23: 11.41 seconds.    Time 8   Period Weeks    Status Partially Met    Target Date 05/08/2023     PT LONG TERM GOAL #3  Title Pt will improve DGI to >19/24 to indicate decreased risk of falls with household and community walking tasks    Baseline 11/23: 11/24 1/19:12/24    Time 8    Period Weeks    Status Partially Met    Target Date 05/08/2023     PT LONG TERM GOAL #4   Title Pt. Will increase R hip flexion/ LE muscle strength 1/2 muscle grade to improve standing tolerance/ walking endurance.    Baseline See above.  4/1: B hip flexion 4+/5 MMT (good control on R hip today).     Time 8    Period Weeks    Status Partially Met    Target Date 05/08/2023         Plan -     Clinical Impression Statement Patient arrives to session with antalgic gait pattern using SPC. Continues to be limited by B knee pain and lower leg swelling.  Pt. Had recent f/u with ortho MD and is scheduling synvisc injections.   Session focused on endurance training and LE strengthening.  Patient will continue to benefit from skilled PT services to progress strength and mobility, along with cardiovascular endurance.      Personal Factors and Comorbidities Comorbidity 2    Comorbidities Hypertension, Obesity    Examination-Activity Limitations Bed Mobility;Reach Overhead;Squat;Lift;Dressing;Hygiene/Grooming    Examination-Participation Restrictions Community Activity;Yard Work    Conservation officer, historic buildings Evolving/Moderate complexity    Clinical Decision Making Moderate    Rehab Potential Fair    PT Frequency 1x / week    PT Duration 12 weeks    PT  Treatment/Interventions ADLs/Self Care Home Management;Gait training;Stair training;Functional mobility training;Therapeutic activities;Therapeutic exercise;Balance training;Neuromuscular re-education;Manual techniques;Patient/family education;Electrical Stimulation;Moist Heat;Cryotherapy    PT POC Reassess HEP/ before Nustep next tx.   PT Home Exercise Plan Progress endurance training to improve functional activity. Add sit to stand activity.      Consulted and Agree with Plan of Care Patient         Cammie Mcgee, PT, DPT # [phone] Physical Therapist - [facility]  07/04/2023, 8:53 PM

## 2023-07-06 DIAGNOSIS — H35372 Puckering of macula, left eye: Secondary | ICD-10-CM | POA: Diagnosis not present

## 2023-07-06 DIAGNOSIS — H2511 Age-related nuclear cataract, right eye: Secondary | ICD-10-CM | POA: Diagnosis not present

## 2023-07-06 DIAGNOSIS — Z961 Presence of intraocular lens: Secondary | ICD-10-CM | POA: Diagnosis not present

## 2023-07-06 DIAGNOSIS — H40003 Preglaucoma, unspecified, bilateral: Secondary | ICD-10-CM | POA: Diagnosis not present

## 2023-07-11 ENCOUNTER — Encounter: Payer: Self-pay | Admitting: Family Medicine

## 2023-07-11 ENCOUNTER — Encounter: Payer: Self-pay | Admitting: Physical Therapy

## 2023-07-11 ENCOUNTER — Ambulatory Visit: Payer: Medicare Other | Attending: Family Medicine | Admitting: Physical Therapy

## 2023-07-11 DIAGNOSIS — M6281 Muscle weakness (generalized): Secondary | ICD-10-CM

## 2023-07-11 DIAGNOSIS — R279 Unspecified lack of coordination: Secondary | ICD-10-CM | POA: Diagnosis not present

## 2023-07-11 DIAGNOSIS — R262 Difficulty in walking, not elsewhere classified: Secondary | ICD-10-CM | POA: Diagnosis not present

## 2023-07-11 DIAGNOSIS — R2681 Unsteadiness on feet: Secondary | ICD-10-CM | POA: Diagnosis not present

## 2023-07-11 DIAGNOSIS — R269 Unspecified abnormalities of gait and mobility: Secondary | ICD-10-CM | POA: Diagnosis not present

## 2023-07-11 NOTE — Therapy (Signed)
OUTPATIENT PHYSICAL THERAPY TREATMENT Physical Therapy Progress Note  Dates of reporting period  03/23/23   to   07/11/23   Patient Name: Tom Underwood MRN: [id number] DOB:09-02-1951, 72 y.o., male Today's Date: 07/11/2023  PCP: Alba Cory, MD REFERRING PROVIDER: Caro Laroche, DO    PT End of Session - 07/11/23 1343     Visit Number 50    Number of Visits 56    Date for PT Re-Evaluation 08/02/23    PT Start Time 1341    PT Stop Time 1433    PT Time Calculation (min) 52 min    Equipment Utilized During Treatment Other (comment)    Activity Tolerance Patient tolerated treatment well;Patient limited by fatigue;Patient limited by pain    Behavior During Therapy WFL for tasks assessed/performed               Past Medical History:  Diagnosis Date   Anxiety    Arthritis    Atrial fibrillation (HCC)    a.) CHA2DS2-VASc = 4 (age, HTN, CVA x2). b.) s/p 200 J synchronized cardioversion (DCCV) on 12/08/2021. c.) rate/rythum maintained on oral metoprolol tartrate; chronically anticoagulated using rivaroxaban.   BPH (benign prostatic hyperplasia)    Complication of anesthesia    a.)  Intolerant of propofol; causes "skin burning and involuntary muscle twitching"   DDD (degenerative disc disease), lumbar    Decreased libido    Depression    Diverticulitis    Diverticulosis    GERD (gastroesophageal reflux disease)    Hepatic steatosis    History of 2019 novel coronavirus disease (COVID-19) 12/28/2020   History of cardiac murmur in childhood    History of kidney stones    HLD (hyperlipidemia)    HLD (hyperlipidemia)    Hypertension    Hypogonadism in male    Long term current use of anticoagulant    a.) rivaroxaban   OSA on CPAP    Pre-diabetes    Rhinitis, allergic    Stroke (HCC) 07/23/2019   a.) LEFT posterior cor radiata. b.) residual RIGHT sided weakness   Past Surgical History:  Procedure Laterality Date   CARDIOVERSION N/A 12/08/2021   Procedure:  CARDIOVERSION;  Surgeon: Yvonne Kendall, MD;  Location: [facility];  Service: Cardiovascular;  Laterality: N/A;   COLONOSCOPY     EVALUATION UNDER ANESTHESIA WITH HEMORRHOIDECTOMY N/A 04/15/2022   Procedure: EXAM UNDER ANESTHESIA WITH HEMORRHOIDECTOMY;  Surgeon: Sung Amabile, DO;  Location: [facility];  Service: General;  Laterality: N/A;   EXTERNAL EAR SURGERY     EYE SURGERY     FINGER SURGERY Right    lateration to 5 th digit   KNEE SURGERY     left eye     RECTAL EXAM UNDER ANESTHESIA N/A 04/23/2022   Procedure: RECTAL EXAM UNDER ANESTHESIA WITH LIGATION OF BLEEDING;  Surgeon: Henrene Dodge, MD;  Location: [facility];  Service: General;  Laterality: N/A;   Patient Active Problem List   Diagnosis Date Noted   Gastroesophageal reflux disease without esophagitis 05/17/2022   Dysthymia 05/17/2022   History of syncope 05/17/2022   History of hemorrhoidectomy 05/17/2022   Vitamin D deficiency 05/17/2022   Lower extremity edema 05/17/2022   History of rectal bleeding 05/17/2022   Bleeding internal hemorrhoids    Morbid obesity (HCC)    Persistent atrial fibrillation (HCC) 07/14/2020   Hypertension    Hemiparesis of right dominant side as late effect of cerebral infarction (HCC) 08/13/2019   History of kidney stones 05/02/2018  ED (erectile dysfunction) 05/02/2018   Hyperglycemia 01/30/2017   Arthritis of knee, degenerative 01/30/2017   Benign prostatic hyperplasia without lower urinary tract symptoms 02/03/2016   Dyslipidemia 08/04/2015   Diverticulitis of large intestine without perforation or abscess without bleeding 06/30/2015   OSA on CPAP 06/30/2015    REFERRING DIAG: M25.511,M25.512 (ICD-10-CM) - Bilateral shoulder pain, unspecified chronicity M17.2 (ICD-10-CM) - Post-traumatic osteoarthritis of both knees  R26.81 (ICD-10-CM) - Gait instability   THERAPY DIAG:  Muscle weakness (generalized)   Unsteadiness on feet   Abnormality of gait and mobility   Unspecified lack of  coordination   Other abnormalities of gait and mobility   Difficulty in walking, not elsewhere classified  PERTINENT HISTORY: See evaluation  PRECAUTIONS: Fall  10/2:  : 38 feet x 16 laps plus 12 feet (620 feet).  HR: 65 bpm/ O2 sat. 96%.  2/19: : 68 feet x 6laps plus 10 ft. = (418)  Post VS: HR- 67 O2 Sat: 97  SUBJECTIVE:  07/11/2023    Pt. States he feels his legs are getting stronger "slowly but surely".   Patient enters PT with use of SPC and significant B lower leg swelling. Reports L LE pain 10/10 and R LE pain 9/10.  No falls or new complaints.  PT discussed pts. Daily walking routine/ HEP.  Pt. Has not scheduled synvisc injections at this time.  TODAY'S TREATMENT:   There Ex.:  Nustep L6 10 min. B UE/LE.  Seat position #10/ arm position #10.  0.59 miles.  Increase cadence/no increase c/o knee pain.  Moderate SOB noted.  O2: 94%, HR 74 bpm.         TRX squats 22x. Chair behind patient for cue on squat depth. Pt. Cued to keep knees behind toes to avoid excess pressure on knee joint.   Moderate fatigue noted/ decrease L UT tremors noted today.  Discussed pts. Tremors/ UE use with pt.    Seated shoulder flexion/ chest press with wt. Wand 22x each.  Cuing for full B elbow extension/ scapular retraction.    Standing Nautilus: 60# lat. Pull downs with added shoulder flexion holds 22x.  Seated lat. Pull downs 60# 22x.  Standing scap. Retraction 60# with staggered stance for balance 12x2  Walking in clinic/ //-bars with high marching (cuing for controlled breathing/ upright posture).    Reviewed HEP in depth  PATIENT EDUCATION: Education details: HEP/ daily activity Person educated: Patient Education method: Medical illustrator Education comprehension: verbalized understanding and returned demonstration   HOME EXERCISE PROGRAM: Handouts given for shoulder ROM/ strengthening/ standing hip ex.    PT Short Term Goals -  06/07/23      PT SHORT TERM GOAL  #1   Title Pt able to complete and ambulate >800 feet with no assistive device safely.     Baseline 10/2:  620 feet.  HR: 65 bpm/ O2 sat. 95%. 2/19: 418 feet. HR: 67 bpm/ O2 sat.97%   Time 4    Period Weeks    Status Partially met          PT Long Term Goals -  08/02/2023      PT LONG TERM GOAL #1   Title Pt will improve FOTO score to 50 to display improvement in functional mobility.    Baseline 6/7: 33.  10/23: 52   Time 8   Period Weeks    Status Goal met on 10/23   Target Date 12/05/2022     PT LONG TERM GOAL #  2   Title Pt wil improve TUG score to < 12 sec with no AD to display significant imporvement in reduced risk of falls    Baseline 11/23: 26 sec (did not sit down immediately leading to higher time than he would've scored) 1/19: 14.44 sec.   3/7: 12.66 sec. 03/06/23: 11.41 seconds.    Time 8   Period Weeks    Status Partially Met    Target Date 08/02/2023     PT LONG TERM GOAL #3   Title Pt will improve DGI to >19/24 to indicate decreased risk of falls with household and community walking tasks    Baseline 11/23: 11/24 1/19:12/24    Time 8    Period Weeks    Status Partially Met    Target Date 08/02/2023     PT LONG TERM GOAL #4   Title Pt. Will increase R hip flexion/ LE muscle strength 1/2 muscle grade to improve standing tolerance/ walking endurance.    Baseline See above.  4/1: B hip flexion 4+/5 MMT (good control on R hip today).     Time 8    Period Weeks    Status Partially Met    Target Date 08/02/2023         Plan -     Clinical Impression Statement Patient arrives to session with antalgic gait pattern using SPC. Continues to be limited by B knee pain and lower leg swelling.  Pt. Educated on importance of daily walking/ HEP to improve LE strengthening and overall endurance.   Session focused on endurance training and LE strengthening.  See updated goals.  Patient will continue to benefit from skilled PT services to progress strength and mobility, along  with cardiovascular endurance.      Personal Factors and Comorbidities Comorbidity 2    Comorbidities Hypertension, Obesity    Examination-Activity Limitations Bed Mobility;Reach Overhead;Squat;Lift;Dressing;Hygiene/Grooming    Examination-Participation Restrictions Community Activity;Yard Work    Conservation officer, historic buildings Evolving/Moderate complexity    Clinical Decision Making Moderate    Rehab Potential Fair    PT Frequency 1x / week    PT Duration 12 weeks    PT Treatment/Interventions ADLs/Self Care Home Management;Gait training;Stair training;Functional mobility training;Therapeutic activities;Therapeutic exercise;Balance training;Neuromuscular re-education;Manual techniques;Patient/family education;Electrical Stimulation;Moist Heat;Cryotherapy    PT POC Reassess HEP/ before Nustep next tx.   PT Home Exercise Plan Progress endurance training to improve functional activity.     Consulted and Agree with Plan of Care Patient         Cammie Mcgee, PT, DPT # [phone] Physical Therapist - [facility]  07/11/2023, 6:04 PM

## 2023-07-18 ENCOUNTER — Encounter: Payer: Medicare Other | Admitting: Physical Therapy

## 2023-07-21 ENCOUNTER — Other Ambulatory Visit: Payer: Self-pay | Admitting: Urology

## 2023-07-21 ENCOUNTER — Encounter: Payer: Self-pay | Admitting: Physical Therapy

## 2023-07-21 ENCOUNTER — Ambulatory Visit: Payer: Medicare Other | Admitting: Physical Therapy

## 2023-07-21 DIAGNOSIS — R262 Difficulty in walking, not elsewhere classified: Secondary | ICD-10-CM | POA: Diagnosis not present

## 2023-07-21 DIAGNOSIS — R269 Unspecified abnormalities of gait and mobility: Secondary | ICD-10-CM | POA: Diagnosis not present

## 2023-07-21 DIAGNOSIS — R279 Unspecified lack of coordination: Secondary | ICD-10-CM

## 2023-07-21 DIAGNOSIS — M6281 Muscle weakness (generalized): Secondary | ICD-10-CM | POA: Diagnosis not present

## 2023-07-21 DIAGNOSIS — R2681 Unsteadiness on feet: Secondary | ICD-10-CM

## 2023-07-21 DIAGNOSIS — N200 Calculus of kidney: Secondary | ICD-10-CM

## 2023-07-21 DIAGNOSIS — R319 Hematuria, unspecified: Secondary | ICD-10-CM

## 2023-07-21 NOTE — Progress Notes (Addendum)
07/24/2023 3:30 PM   [name] [birth year] [id number]  Referring provider: Alba Cory, MD [address] [place],  [place] [address]  Urological history: 1. High risk hematuria -former smoker -CTU (2017) - NED -cysto (2017) - NED -CTU (08/2022) - bilateral nephrolithiasis -cysto (09/2022) - BPH w/ friable lobes -urine cytology (09/2022) - negative  2. BPH with LU TS -PSA (06/2023) 0.2 -cysto (09/2022) - prominent lateral lobe enlargement w/friability - mild trabeculation -prostate volume (09/2022) - 70 cc -tamsulosin 0.4 mg daily and finasteride 5 mg daily   3. Nephrolithiasis -spontaneous passage of a left UPJ stone (2014) -CTU (09/2022) - bilateral renal stones up to 4 mm stones  4. ED -contributing factors of age, BPH, HTN, HLD, stroke, sleep apnea and former smoker -not sexually active  Chief Complaint  Patient presents with   Benign Prostatic Hypertrophy   Nephrolithiasis    HPI: Tom Underwood is a 72 y.o. male who presents today for yearly follow up.    Previous records reviewed.   KUB bilateral nephrolithiasis  I PSS 12/4   He has been experiencing more urgency over the last two months.  He denies any nocturia.  Patient denies any modifying or aggravating factors.  Patient denies any recent UTI's, gross hematuria, dysuria or suprapubic/flank pain.  Patient denies any fevers, chills, nausea or vomiting.   PVR 533 mL; repeat PVR 158 mL   UA yellow hazy, specific gravity 1.020, pH 5.5, trace heme, moderate leuks, 0-5 Squames, 21-50 WBC's, 0-5 RBC's, few bacteria and WBC clumps present.    IPSS     Row Name 07/24/23 1400         International Prostate Symptom Score   How often have you had the sensation of not emptying your bladder? Less than half the time     How often have you had to urinate less than every two hours? More than half the time     How often have you found you stopped and started again several times when  you urinated? Less than half the time     How often have you found it difficult to postpone urination? Less than half the time     How often have you had a weak urinary stream? Less than 1 in 5 times     How often have you had to strain to start urination? Less than 1 in 5 times     How many times did you typically get up at night to urinate? None     Total IPSS Score 12       Quality of Life due to urinary symptoms   If you were to spend the rest of your life with your urinary condition just the way it is now how would you feel about that? Mostly Disatisfied              Score:  1-7 Mild 8-19 Moderate 20-35 Severe   PMH: Past Medical History:  Diagnosis Date   Anxiety    Arthritis    Atrial fibrillation (HCC)    a.) CHA2DS2-VASc = 4 (age, HTN, CVA x2). b.) s/p 200 J synchronized cardioversion (DCCV) on 12/08/2021. c.) rate/rythum maintained on oral metoprolol tartrate; chronically anticoagulated using rivaroxaban.   BPH (benign prostatic hyperplasia)    Complication of anesthesia    a.)  Intolerant of propofol; causes "skin burning and involuntary muscle twitching"   DDD (degenerative disc disease), lumbar    Decreased libido  Depression    Diverticulitis    Diverticulosis    GERD (gastroesophageal reflux disease)    Hepatic steatosis    History of 2019 novel coronavirus disease (COVID-19) 12/28/2020   History of cardiac murmur in childhood    History of kidney stones    HLD (hyperlipidemia)    HLD (hyperlipidemia)    Hypertension    Hypogonadism in male    Long term current use of anticoagulant    a.) rivaroxaban   OSA on CPAP    Pre-diabetes    Rhinitis, allergic    Stroke (HCC) 07/23/2019   a.) LEFT posterior cor radiata. b.) residual RIGHT sided weakness    Surgical History: Past Surgical History:  Procedure Laterality Date   CARDIOVERSION N/A 12/08/2021   Procedure: CARDIOVERSION;  Surgeon: Yvonne Kendall, MD;  Location: [facility] ORS;  Service:  Cardiovascular;  Laterality: N/A;   COLONOSCOPY     EVALUATION UNDER ANESTHESIA WITH HEMORRHOIDECTOMY N/A 04/15/2022   Procedure: EXAM UNDER ANESTHESIA WITH HEMORRHOIDECTOMY;  Surgeon: Sung Amabile, DO;  Location: [facility] ORS;  Service: General;  Laterality: N/A;   EXTERNAL EAR SURGERY     EYE SURGERY     FINGER SURGERY Right    lateration to 5 th digit   KNEE SURGERY     left eye     RECTAL EXAM UNDER ANESTHESIA N/A 04/23/2022   Procedure: RECTAL EXAM UNDER ANESTHESIA WITH LIGATION OF BLEEDING;  Surgeon: Henrene Dodge, MD;  Location: [facility] ORS;  Service: General;  Laterality: N/A;    Home Medications:  Allergies as of 07/24/2023       Reactions   Penicillins Itching        Medication List        Accurate as of July 24, 2023  3:30 PM. If you have any questions, ask your nurse or doctor.          amitriptyline 25 MG tablet Commonly known as: ELAVIL TAKE 1 TABLET BY MOUTH AT BEDTIME AS NEEDED FOR SLEEP   aspirin EC 81 MG tablet Take 1 tablet (81 mg total) by mouth daily. Swallow whole.   buPROPion 150 MG 24 hr tablet Commonly known as: WELLBUTRIN XL Take 1 tablet (150 mg total) by mouth every morning.   busPIRone 10 MG tablet Commonly known as: BUSPAR Take 1 tablet (10 mg total) by mouth 2 (two) times daily.   finasteride 5 MG tablet Commonly known as: PROSCAR Take 1 tablet (5 mg total) by mouth daily.   fluticasone 50 MCG/ACT nasal spray Commonly known as: FLONASE SPRAY 2 SPRAYS INTO EACH NOSTRIL EVERY DAY   furosemide 20 MG tablet Commonly known as: LASIX Take 1 tablet (20 mg total) by mouth as needed. For lower extremity swelling/ PLEASE CALL OFFICE TO SCHEDULE APPOINTMENT PRIOR TO NEXT REFILL What changed: additional instructions Changed by: CMA Heather S   GLUCOSAMINE-CHONDROITIN PO Take 2 tablets by mouth in the morning and at bedtime.   lidocaine 5 % ointment Commonly known as: XYLOCAINE Apply 1 application. topically 3 (three) times daily as needed.  Apply pea size amount to area as needed   methocarbamol 500 MG tablet Commonly known as: ROBAXIN Take 500 mg by mouth every 8 (eight) hours as needed for muscle spasms.   metoprolol tartrate 25 MG tablet Commonly known as: LOPRESSOR Take 1 tablet (25 mg total) by mouth 2 (two) times daily.   multivitamin tablet Take 1 tablet by mouth daily.   omega-3 acid ethyl esters 1 g capsule Commonly known as:  LOVAZA Take 2 capsules (2 g total) by mouth 2 (two) times daily.   pantoprazole 20 MG tablet Commonly known as: PROTONIX Take 1 tablet (20 mg total) by mouth daily.   Probiotic 250 MG Caps Take 1 capsule by mouth daily.   rosuvastatin 20 MG tablet Commonly known as: CRESTOR Take 1 tablet (20 mg total) by mouth daily.   sildenafil 50 MG tablet Commonly known as: VIAGRA   tamsulosin 0.4 MG Caps capsule Commonly known as: FLOMAX Take 1 capsule (0.4 mg total) by mouth daily.   valsartan 80 MG tablet Commonly known as: DIOVAN Take 1 tablet (80 mg total) by mouth daily.   Visine Dry Eye Relief 1 % Soln Generic drug: Polyethylene Glycol 400 Place 1 drop into both eyes daily as needed (dry eyes).   Vitamin D 125 MCG (5000 UT) Caps Take 5,000 Units by mouth daily.        Allergies:  Allergies  Allergen Reactions   Penicillins Itching    Family History: Family History  Problem Relation Age of Onset   Asthma Mother    Heart disease Mother    Heart attack Mother    Dementia Mother    Asthma Father    Heart disease Father    Stroke Father     Social History:  reports that he quit smoking about 49 years ago. His smoking use included pipe. He started smoking about 50 years ago. He has never used smokeless tobacco. He reports that he does not drink alcohol and does not use drugs.  ROS: Pertinent ROS in HPI  Physical Exam: BP 130/70 (BP Location: Left Arm, Patient Position: Sitting, Cuff Size: Large)   Pulse (!) 54   Constitutional:  Well nourished. Alert and  oriented, No acute distress. HEENT: [place] AT, moist mucus membranes.  Trachea midline Cardiovascular: No clubbing, cyanosis, or severe LE edema. Respiratory: Normal respiratory effort, no increased work of breathing. Neurologic: Grossly intact, no focal deficits, moving all 4 extremities.  In wheel chair, can only ambulate short distances  Psychiatric: Normal mood and affect.  Laboratory Data: Component     Latest Ref Rng 06/30/2023  Prostate Specific Ag, Serum     0.0 - 4.0 ng/mL 0.2     Lab Results  Component Value Date   WBC 5.9 06/30/2023   HGB 16.0 06/30/2023   HCT 46.3 06/30/2023   MCV 90 06/30/2023   PLT 205 06/30/2023    Lab Results  Component Value Date   CREATININE 1.08 06/30/2023    Lab Results  Component Value Date   HGBA1C 5.6 06/30/2023    Lab Results  Component Value Date   TSH 2.530 05/28/2021       Component Value Date/Time   CHOL 120 06/30/2023 1138   HDL 51 06/30/2023 1138   CHOLHDL 2.4 06/30/2023 1138   LDLCALC 51 06/30/2023 1138    Lab Results  Component Value Date   AST 24 06/30/2023   Lab Results  Component Value Date   ALT 23 06/30/2023   Urinalysis Component     Latest Ref Rng 07/24/2023  Appearance     CLEAR  HAZY !   Color, Urine     YELLOW  YELLOW   Specific Gravity, Urine     1.005 - 1.030  1.020   pH     5.0 - 8.0  5.5   Glucose, UA     NEGATIVE mg/dL NEGATIVE   Hgb urine dipstick     NEGATIVE  TRACE !   Bilirubin Urine     NEGATIVE  NEGATIVE   Ketones, ur     NEGATIVE mg/dL NEGATIVE   Protein     NEGATIVE mg/dL NEGATIVE   Nitrite     NEGATIVE  NEGATIVE   Leukocytes,Ua     NEGATIVE  MODERATE !   Squamous Epithelial / HPF     0 - 5 /HPF 0-5   WBC, UA     0 - 5 WBC/hpf 21-50   RBC / HPF     0 - 5 RBC/hpf 0-5   Bacteria, UA     NONE SEEN  FEW !   WBC Clumps PRESENT     Legend: ! Abnormal I have reviewed the labs.   Pertinent Imaging:  07/24/23 15:13  Scan Result   CLINICAL DATA:  Follow up  kidney stones.   EXAM: ABDOMEN - 1 VIEW   COMPARISON:  09/02/2022 and 07/25/2022.   FINDINGS: The bowel gas pattern is normal. Several 2-3 mm stones overlie both kidneys.   IMPRESSION: Bilateral nephrolithiasis.     Electronically Signed   By: Layla Maw M.D.   On: 07/30/2023 00:40   I have independently reviewed the films.  See HPI.  Assessment & Plan:    1. High risk hematuria -former smoker -work up (09/2022) - bilateral nephrolithiasis/BPH w/friable prostate -no reports of gross heme -UA negative for micro heme   2. BPH with LU TS -PSA stable  -tamsulsolin 0.4 mg daily and finateride 5 mg daily -bothersome symptoms of urgency -explained that he is likely not giving his bladder enough time to empty and to start timed voids (going to the restroom every two hours while awake) and that he is on maximum medical therapy and it is time to start considering the possibility of a bladder outlet procedure -he is not excited about the idea of a procedure, but with his symptoms and increased residual it will likely be necessary in the future unless he is wanting to learn CIC -he is against catheters because his parents had to deal with them  -he would like an appointment with Dr. Richardo Hanks to discuss this further  -I encouraged him to engage in timed voids in the meantime  -I have also sent his urine out for culture to rule out indolent infection   3. ED - not sexually active  4. Nephrolithiasis -KUB stable bilateral nephrolithiasis  Return for Appointment with Dr. Richardo Hanks to discuss bladder outlet procedure .  These notes generated with voice recognition software. I apologize for typographical errors.  Cloretta Ned  [facility] [address]  [address] [place], [place] [address] [phone]

## 2023-07-21 NOTE — Therapy (Signed)
OUTPATIENT PHYSICAL THERAPY TREATMENT  Patient Name: Tom Underwood MRN: [id number] DOB:Sep 05, 1951, 72 y.o., male Today's Date: 07/21/2023  PCP: Alba Cory, MD REFERRING PROVIDER: Caro Laroche, DO    PT End of Session - 07/21/23 0939     Visit Number 51    Number of Visits 56    Date for PT Re-Evaluation 08/02/23    PT Start Time 0931    Equipment Utilized During Treatment Other (comment)    Activity Tolerance Patient tolerated treatment well;Patient limited by fatigue;Patient limited by pain    Behavior During Therapy [facility] for tasks assessed/performed               Past Medical History:  Diagnosis Date   Anxiety    Arthritis    Atrial fibrillation (HCC)    a.) CHA2DS2-VASc = 4 (age, HTN, CVA x2). b.) s/p 200 J synchronized cardioversion (DCCV) on 12/08/2021. c.) rate/rythum maintained on oral metoprolol tartrate; chronically anticoagulated using rivaroxaban.   BPH (benign prostatic hyperplasia)    Complication of anesthesia    a.)  Intolerant of propofol; causes "skin burning and involuntary muscle twitching"   DDD (degenerative disc disease), lumbar    Decreased libido    Depression    Diverticulitis    Diverticulosis    GERD (gastroesophageal reflux disease)    Hepatic steatosis    History of 2019 novel coronavirus disease (COVID-19) 12/28/2020   History of cardiac murmur in childhood    History of kidney stones    HLD (hyperlipidemia)    HLD (hyperlipidemia)    Hypertension    Hypogonadism in male    Long term current use of anticoagulant    a.) rivaroxaban   OSA on CPAP    Pre-diabetes    Rhinitis, allergic    Stroke (HCC) 07/23/2019   a.) LEFT posterior cor radiata. b.) residual RIGHT sided weakness   Past Surgical History:  Procedure Laterality Date   CARDIOVERSION N/A 12/08/2021   Procedure: CARDIOVERSION;  Surgeon: Yvonne Kendall, MD;  Location: [facility] ORS;  Service: Cardiovascular;  Laterality: N/A;   COLONOSCOPY     EVALUATION  UNDER ANESTHESIA WITH HEMORRHOIDECTOMY N/A 04/15/2022   Procedure: EXAM UNDER ANESTHESIA WITH HEMORRHOIDECTOMY;  Surgeon: Sung Amabile, DO;  Location: [facility] ORS;  Service: General;  Laterality: N/A;   EXTERNAL EAR SURGERY     EYE SURGERY     FINGER SURGERY Right    lateration to 5 th digit   KNEE SURGERY     left eye     RECTAL EXAM UNDER ANESTHESIA N/A 04/23/2022   Procedure: RECTAL EXAM UNDER ANESTHESIA WITH LIGATION OF BLEEDING;  Surgeon: Henrene Dodge, MD;  Location: [facility] ORS;  Service: General;  Laterality: N/A;   Patient Active Problem List   Diagnosis Date Noted   Gastroesophageal reflux disease without esophagitis 05/17/2022   Dysthymia 05/17/2022   History of syncope 05/17/2022   History of hemorrhoidectomy 05/17/2022   Vitamin D deficiency 05/17/2022   Lower extremity edema 05/17/2022   History of rectal bleeding 05/17/2022   Bleeding internal hemorrhoids    Morbid obesity (HCC)    Persistent atrial fibrillation (HCC) 07/14/2020   Hypertension    Hemiparesis of right dominant side as late effect of cerebral infarction (HCC) 08/13/2019   History of kidney stones 05/02/2018   ED (erectile dysfunction) 05/02/2018   Hyperglycemia 01/30/2017   Arthritis of knee, degenerative 01/30/2017   Benign prostatic hyperplasia without lower urinary tract symptoms 02/03/2016   Dyslipidemia 08/04/2015   Diverticulitis of  large intestine without perforation or abscess without bleeding 06/30/2015   OSA on CPAP 06/30/2015    REFERRING DIAG: M25.511,M25.512 (ICD-10-CM) - Bilateral shoulder pain, unspecified chronicity M17.2 (ICD-10-CM) - Post-traumatic osteoarthritis of both knees  R26.81 (ICD-10-CM) - Gait instability   THERAPY DIAG:  Muscle weakness (generalized)   Unsteadiness on feet   Abnormality of gait and mobility   Unspecified lack of coordination   Other abnormalities of gait and mobility   Difficulty in walking, not elsewhere classified  PERTINENT HISTORY: See  evaluation  PRECAUTIONS: Fall  10/2:  : 38 feet x 16 laps plus 12 feet (620 feet).  HR: 65 bpm/ O2 sat. 96%.  2/19: : 68 feet x 6laps plus 10 ft. = (418)  Post VS: HR- 67 O2 Sat: 97  SUBJECTIVE:  07/21/2023    Pt. States he feels his legs are getting stronger "slowly but surely".   Patient enters PT with use of SPC and significant B lower leg swelling. Reports L LE pain 9/10 and R LE pain 8/10.  Pt. Has 1st Synvisc injection scheduled for next week.    TODAY'S TREATMENT:   There Ex.:   BP: 127/53 (prior to tx.)   Nustep L6 10 min. B UE/LE.  Seat position #10/ arm position #10.  0.55 miles.  Increase cadence/no increase c/o knee pain.  HR: 68 bpm  O2: 95%     TUG: 21.3 sec. (No SPC), 15.8 sec. (with SPC)  DGI: 14/24  TRX squats 22x. Chair behind patient for cue on squat depth. Pt. Cued to keep knees behind toes to avoid excess pressure on knee joint.   Moderate fatigue noted/ decrease L UT tremors noted today.  Discussed pts. Tremors/ UE use with pt.    Seated shoulder flexion/ chest press with wt. Wand 22x each.  Cuing for full B elbow extension/ scapular retraction.    Walking in clinic/ //-bars with high marching (cuing for controlled breathing/ upright posture).    Not Today Standing Nautilus: 60# lat. Pull downs with added shoulder flexion holds 22x.  Seated lat. Pull downs 60# 22x.  Standing scap. Retraction 60# with staggered stance for balance 12x2  PATIENT EDUCATION: Education details: HEP/ daily activity Person educated: Patient Education method: Medical illustrator Education comprehension: verbalized understanding and returned demonstration   HOME EXERCISE PROGRAM: Handouts given for shoulder ROM/ strengthening/ standing hip ex.    PT Short Term Goals -  06/07/23      PT SHORT TERM GOAL #1   Title Pt able to complete and ambulate >800 feet with no assistive device safely.     Baseline 10/2:  620 feet.  HR: 65 bpm/ O2 sat. 95%. 2/19:  418 feet. HR: 67 bpm/ O2 sat.97%   Time 4    Period Weeks    Status Partially met          PT Long Term Goals -  08/02/2023      PT LONG TERM GOAL #1   Title Pt will improve FOTO score to 50 to display improvement in functional mobility.    Baseline 6/7: 33.  10/23: 52   Time 8   Period Weeks    Status Goal met on 10/23   Target Date 12/05/2022     PT LONG TERM GOAL #2   Title Pt wil improve TUG score to < 12 sec with no AD to display significant imporvement in reduced risk of falls    Baseline 11/23: 26 sec (did not sit  down immediately leading to higher time than he would've scored) 1/19: 14.44 sec.   3/7: 12.66 sec. 03/06/23: 11.41 seconds. 8/16: 21.3 sec. (No SPC), 15.8 sec. (with SPC)   Time 8   Period Weeks    Status Partially Met    Target Date 08/02/2023     PT LONG TERM GOAL #3   Title Pt will improve DGI to >19/24 to indicate decreased risk of falls with household and community walking tasks    Baseline 11/23: 11/24 1/19:12/24 8/16: 14/24   Time 8    Period Weeks    Status Partially Met    Target Date 08/02/2023     PT LONG TERM GOAL #4   Title Pt. Will increase R hip flexion/ LE muscle strength 1/2 muscle grade to improve standing tolerance/ walking endurance.    Baseline See above.  4/1: B hip flexion 4+/5 MMT (good control on R hip today).     Time 8    Period Weeks    Status Partially Met    Target Date 08/02/2023         Plan -     Clinical Impression Statement Patient arrives to session with antalgic gait pattern using SPC. Continues to be limited by B knee pain and lower leg swelling.  Pt. Educated on importance of daily walking/ HEP to improve LE strengthening and overall endurance.   Session focused on endurance training and LE strengthening.  See updated goals.  Patient will continue to benefit from skilled PT services to progress strength and mobility, along with cardiovascular endurance.      Personal Factors and Comorbidities Comorbidity 2     Comorbidities Hypertension, Obesity    Examination-Activity Limitations Bed Mobility;Reach Overhead;Squat;Lift;Dressing;Hygiene/Grooming    Examination-Participation Restrictions Community Activity;Yard Work    Conservation officer, historic buildings Evolving/Moderate complexity    Clinical Decision Making Moderate    Rehab Potential Fair    PT Frequency 1x / week    PT Duration 12 weeks    PT Treatment/Interventions ADLs/Self Care Home Management;Gait training;Stair training;Functional mobility training;Therapeutic activities;Therapeutic exercise;Balance training;Neuromuscular re-education;Manual techniques;Patient/family education;Electrical Stimulation;Moist Heat;Cryotherapy    PT POC Reassess HEP   PT Home Exercise Plan Progress endurance training to improve functional activity.     Consulted and Agree with Plan of Care Patient         Cammie Mcgee, PT, DPT # [phone] Physical Therapist - [facility]  07/21/2023, 9:39 AM

## 2023-07-24 ENCOUNTER — Ambulatory Visit: Admission: RE | Admit: 2023-07-24 | Payer: Medicare Other | Source: Ambulatory Visit

## 2023-07-24 ENCOUNTER — Other Ambulatory Visit: Admission: RE | Admit: 2023-07-24 | Payer: Medicare Other | Source: Home / Self Care

## 2023-07-24 ENCOUNTER — Encounter: Payer: Self-pay | Admitting: Urology

## 2023-07-24 ENCOUNTER — Ambulatory Visit (INDEPENDENT_AMBULATORY_CARE_PROVIDER_SITE_OTHER): Payer: Medicare Other | Admitting: Urology

## 2023-07-24 ENCOUNTER — Ambulatory Visit
Admission: RE | Admit: 2023-07-24 | Discharge: 2023-07-24 | Disposition: A | Discharge status: Home / Self Care | Payer: Medicare Other | Attending: Urology | Admitting: Urology

## 2023-07-24 ENCOUNTER — Other Ambulatory Visit: Payer: Self-pay

## 2023-07-24 VITALS — BP 130/70 | HR 54

## 2023-07-24 DIAGNOSIS — N400 Enlarged prostate without lower urinary tract symptoms: Secondary | ICD-10-CM

## 2023-07-24 DIAGNOSIS — N529 Male erectile dysfunction, unspecified: Secondary | ICD-10-CM | POA: Diagnosis not present

## 2023-07-24 DIAGNOSIS — R319 Hematuria, unspecified: Secondary | ICD-10-CM

## 2023-07-24 DIAGNOSIS — N200 Calculus of kidney: Secondary | ICD-10-CM

## 2023-07-24 DIAGNOSIS — N401 Enlarged prostate with lower urinary tract symptoms: Secondary | ICD-10-CM | POA: Diagnosis not present

## 2023-07-24 LAB — URINALYSIS, COMPLETE (UACMP) WITH MICROSCOPIC
Bilirubin Urine: NEGATIVE
Glucose, UA: NEGATIVE mg/dL
Ketones, ur: NEGATIVE mg/dL
Nitrite: NEGATIVE
Protein, ur: NEGATIVE mg/dL
Specific Gravity, Urine: 1.02 (ref 1.005–1.030)
pH: 5.5 (ref 5.0–8.0)

## 2023-07-24 LAB — BLADDER SCAN AMB NON-IMAGING

## 2023-07-24 MED ORDER — FUROSEMIDE 20 MG PO TABS: 20.0000 mg | ORAL_TABLET | ORAL | 0 refills | Status: DC | PRN

## 2023-07-24 NOTE — Addendum Note (Signed)
Addended by: Frankey Shown on: 07/24/2023 03:39 PM   Modules accepted: Orders

## 2023-07-24 NOTE — Telephone Encounter (Signed)
last visit with Dr. Azucena Cecil on 12/23/22 with plan to f/u 6 months.  Please schedule f/u appt.  Thanks.

## 2023-07-24 NOTE — Telephone Encounter (Signed)
Requested Prescriptions   Signed Prescriptions Disp Refills   furosemide (LASIX) 20 MG tablet 90 tablet 0    Sig: Take 1 tablet (20 mg total) by mouth as needed. For lower extremity swelling/ PLEASE CALL OFFICE TO SCHEDULE APPOINTMENT PRIOR TO NEXT REFILL    Authorizing Provider: Debbe Odea    Ordering User: Guerry Minors

## 2023-07-25 LAB — URINE CULTURE: Culture: <10000 — AB

## 2023-07-26 DIAGNOSIS — M170 Bilateral primary osteoarthritis of knee: Secondary | ICD-10-CM | POA: Diagnosis not present

## 2023-07-26 NOTE — Telephone Encounter (Signed)
Patient wife states he will call back to schedule

## 2023-07-26 NOTE — Telephone Encounter (Signed)
Scheduled 10/16

## 2023-07-28 ENCOUNTER — Ambulatory Visit: Payer: Medicare Other | Admitting: Physical Therapy

## 2023-07-28 ENCOUNTER — Encounter: Payer: Self-pay | Admitting: Physical Therapy

## 2023-07-28 DIAGNOSIS — R262 Difficulty in walking, not elsewhere classified: Secondary | ICD-10-CM

## 2023-07-28 DIAGNOSIS — R2681 Unsteadiness on feet: Secondary | ICD-10-CM

## 2023-07-28 DIAGNOSIS — R279 Unspecified lack of coordination: Secondary | ICD-10-CM | POA: Diagnosis not present

## 2023-07-28 DIAGNOSIS — M6281 Muscle weakness (generalized): Secondary | ICD-10-CM | POA: Diagnosis not present

## 2023-07-28 DIAGNOSIS — R269 Unspecified abnormalities of gait and mobility: Secondary | ICD-10-CM | POA: Diagnosis not present

## 2023-07-28 NOTE — Therapy (Signed)
OUTPATIENT PHYSICAL THERAPY TREATMENT  Patient Name: Tom Underwood MRN: [id number] DOB:1951/02/22, 72 y.o., male Today's Date: 07/28/2023  PCP: Alba Cory, MD REFERRING PROVIDER: Caro Laroche, DO    PT End of Session - 07/28/23 1330     Visit Number 52    Number of Visits 56    Date for PT Re-Evaluation 08/02/23    PT Start Time 1253    PT Stop Time 1400    PT Time Calculation (min) 67 min    Equipment Utilized During Treatment Other (comment)    Activity Tolerance Patient tolerated treatment well;Patient limited by fatigue;Patient limited by pain    Behavior During Therapy WFL for tasks assessed/performed               Past Medical History:  Diagnosis Date   Anxiety    Arthritis    Atrial fibrillation (HCC)    a.) CHA2DS2-VASc = 4 (age, HTN, CVA x2). b.) s/p 200 J synchronized cardioversion (DCCV) on 12/08/2021. c.) rate/rythum maintained on oral metoprolol tartrate; chronically anticoagulated using rivaroxaban.   BPH (benign prostatic hyperplasia)    Complication of anesthesia    a.)  Intolerant of propofol; causes "skin burning and involuntary muscle twitching"   DDD (degenerative disc disease), lumbar    Decreased libido    Depression    Diverticulitis    Diverticulosis    GERD (gastroesophageal reflux disease)    Hepatic steatosis    History of 2019 novel coronavirus disease (COVID-19) 12/28/2020   History of cardiac murmur in childhood    History of kidney stones    HLD (hyperlipidemia)    HLD (hyperlipidemia)    Hypertension    Hypogonadism in male    Long term current use of anticoagulant    a.) rivaroxaban   OSA on CPAP    Pre-diabetes    Rhinitis, allergic    Stroke (HCC) 07/23/2019   a.) LEFT posterior cor radiata. b.) residual RIGHT sided weakness   Past Surgical History:  Procedure Laterality Date   CARDIOVERSION N/A 12/08/2021   Procedure: CARDIOVERSION;  Surgeon: Yvonne Kendall, MD;  Location: [facility];  Service:  Cardiovascular;  Laterality: N/A;   COLONOSCOPY     EVALUATION UNDER ANESTHESIA WITH HEMORRHOIDECTOMY N/A 04/15/2022   Procedure: EXAM UNDER ANESTHESIA WITH HEMORRHOIDECTOMY;  Surgeon: Sung Amabile, DO;  Location: [facility];  Service: General;  Laterality: N/A;   EXTERNAL EAR SURGERY     EYE SURGERY     FINGER SURGERY Right    lateration to 5 th digit   KNEE SURGERY     left eye     RECTAL EXAM UNDER ANESTHESIA N/A 04/23/2022   Procedure: RECTAL EXAM UNDER ANESTHESIA WITH LIGATION OF BLEEDING;  Surgeon: Henrene Dodge, MD;  Location: [facility];  Service: General;  Laterality: N/A;   Patient Active Problem List   Diagnosis Date Noted   Gastroesophageal reflux disease without esophagitis 05/17/2022   Dysthymia 05/17/2022   History of syncope 05/17/2022   History of hemorrhoidectomy 05/17/2022   Vitamin D deficiency 05/17/2022   Lower extremity edema 05/17/2022   History of rectal bleeding 05/17/2022   Bleeding internal hemorrhoids    Morbid obesity (HCC)    Persistent atrial fibrillation (HCC) 07/14/2020   Hypertension    Hemiparesis of right dominant side as late effect of cerebral infarction (HCC) 08/13/2019   History of kidney stones 05/02/2018   ED (erectile dysfunction) 05/02/2018   Hyperglycemia 01/30/2017   Arthritis of knee, degenerative 01/30/2017   Benign  prostatic hyperplasia without lower urinary tract symptoms 02/03/2016   Dyslipidemia 08/04/2015   Diverticulitis of large intestine without perforation or abscess without bleeding 06/30/2015   OSA on CPAP 06/30/2015    REFERRING DIAG: M25.511,M25.512 (ICD-10-CM) - Bilateral shoulder pain, unspecified chronicity M17.2 (ICD-10-CM) - Post-traumatic osteoarthritis of both knees  R26.81 (ICD-10-CM) - Gait instability   THERAPY DIAG:  Muscle weakness (generalized)   Unsteadiness on feet   Abnormality of gait and mobility   Unspecified lack of coordination   Other abnormalities of gait and mobility   Difficulty in  walking, not elsewhere classified  PERTINENT HISTORY: See evaluation  PRECAUTIONS: Fall  10/2:  : 38 feet x 16 laps plus 12 feet (620 feet).  HR: 65 bpm/ O2 sat. 96%.  2/19: : 68 feet x 6laps plus 10 ft. = (418)  Post VS: HR- 67 O2 Sat: 97  8/21: TUG: 21.3 sec. (No SPC), 15.8 sec. (with SPC).  8/21: DGI: 14/24  SUBJECTIVE:  07/28/2023    Pt. Entered PT with use of SPC and is wearing B lower leg compression stockings.  Pt. Had his 1st Synvisc injection on Wednesday (8/21) and reports less overall knee pain 2/10 bilaterally.  Pt. Has 2nd Synvisc injection scheduled for next week.    TODAY'S TREATMENT:   There Ex.:  Nustep L6 10 min. B UE/LE.  Seat position #10/ arm position #10.  0.56 miles.  Increase cadence/no increase c/o knee pain.     TRX squats 22x. Chair behind patient for cue on squat depth. Pt. Cued to keep knees behind toes to avoid excess pressure on knee joint.   Moderate fatigue noted/ decrease L UT tremors noted today.  Discussed pts. Tremors/ UE use with pt.    Resisted walking in //-bars: 2BTB forward/backwards/lateral 5x each.  SBA for safety/ cuing.    Walking in clinic/ hallway/ out to car with use of SPC and moderate cuing to correct upright posture/ consistent step pattern.  R hip/LE ER and significant swelling in lower leg.     Not Today Standing Nautilus: 60# lat. Pull downs with added shoulder flexion holds 22x.  Seated lat. Pull downs 60# 22x.  Standing scap. Retraction 60# with staggered stance for balance 12x2  PATIENT EDUCATION: Education details: HEP/ daily activity Person educated: Patient Education method: Medical illustrator Education comprehension: verbalized understanding and returned demonstration   HOME EXERCISE PROGRAM: Handouts given for shoulder ROM/ strengthening/ standing hip ex.    PT Short Term Goals -  06/07/23      PT SHORT TERM GOAL #1   Title Pt able to complete and ambulate >800 feet with no  assistive device safely.     Baseline 10/2:  620 feet.  HR: 65 bpm/ O2 sat. 95%. 2/19: 418 feet. HR: 67 bpm/ O2 sat.97%   Time 4    Period Weeks    Status Partially met          PT Long Term Goals -  08/02/2023      PT LONG TERM GOAL #1   Title Pt will improve FOTO score to 50 to display improvement in functional mobility.    Baseline 6/7: 33.  10/23: 52   Time 8   Period Weeks    Status Goal met on 10/23   Target Date 12/05/2022     PT LONG TERM GOAL #2   Title Pt wil improve TUG score to < 12 sec with no AD to display significant imporvement in reduced risk  of falls    Baseline 11/23: 26 sec (did not sit down immediately leading to higher time than he would've scored) 1/19: 14.44 sec.   3/7: 12.66 sec. 03/06/23: 11.41 seconds. 8/16: 21.3 sec. (No SPC), 15.8 sec. (with SPC)   Time 8   Period Weeks    Status Partially Met    Target Date 08/02/2023     PT LONG TERM GOAL #3   Title Pt will improve DGI to >19/24 to indicate decreased risk of falls with household and community walking tasks    Baseline 11/23: 11/24 1/19:12/24 8/16: 14/24   Time 8    Period Weeks    Status Partially Met    Target Date 08/02/2023     PT LONG TERM GOAL #4   Title Pt. Will increase R hip flexion/ LE muscle strength 1/2 muscle grade to improve standing tolerance/ walking endurance.    Baseline See above.  4/1: B hip flexion 4+/5 MMT (good control on R hip today).     Time 8    Period Weeks    Status Partially Met    Target Date 08/02/2023         Plan -     Clinical Impression Statement Pt. Benefits from bilateral lower leg compression stockings.  Significant swelling in lower leg (R>L) during all standing/walking tasks.  Pt. Educated on importance of daily walking/ HEP to improve LE strengthening and overall endurance.  Pt. Motivated to increase endurance/ LE strengthening.  Less overall pain in B knees today since Synvisc injections.  Session focused on endurance training and LE strengthening.  See  updated goals.  Patient will continue to benefit from skilled PT services to progress strength and mobility, along with cardiovascular endurance.      Personal Factors and Comorbidities Comorbidity 2    Comorbidities Hypertension, Obesity    Examination-Activity Limitations Bed Mobility;Reach Overhead;Squat;Lift;Dressing;Hygiene/Grooming    Examination-Participation Restrictions Community Activity;Yard Work    Conservation officer, historic buildings Evolving/Moderate complexity    Clinical Decision Making Moderate    Rehab Potential Fair    PT Frequency 1x / week    PT Duration 12 weeks    PT Treatment/Interventions ADLs/Self Care Home Management;Gait training;Stair training;Functional mobility training;Therapeutic activities;Therapeutic exercise;Balance training;Neuromuscular re-education;Manual techniques;Patient/family education;Electrical Stimulation;Moist Heat;Cryotherapy    PT POC Reassess HEP   PT Home Exercise Plan Progress endurance training to improve functional activity.     Consulted and Agree with Plan of Care Patient         Cammie Mcgee, PT, DPT # [phone] Physical Therapist - [facility]  07/28/2023, 2:01 PM

## 2023-07-30 NOTE — Progress Notes (Unsigned)
Celso Amy, PA-C [address]  [address]  [place], [place] [address]  Main: [phone]  Fax: [phone]   Gastroenterology Consultation  Referring Provider:     Alba Cory, MD Primary Care Physician:  Alba Cory, MD Primary Gastroenterologist:  *** Reason for Consultation:     ***        HPI:   Tom Underwood is a 72 y.o. y/o male referred for consultation & management  by Alba Cory, MD.  ***  Past Medical History:  Diagnosis Date   Anxiety    Arthritis    Atrial fibrillation (HCC)    a.) CHA2DS2-VASc = 4 (age, HTN, CVA x2). b.) s/p 200 J synchronized cardioversion (DCCV) on 12/08/2021. c.) rate/rythum maintained on oral metoprolol tartrate; chronically anticoagulated using rivaroxaban.   BPH (benign prostatic hyperplasia)    Complication of anesthesia    a.)  Intolerant of propofol; causes "skin burning and involuntary muscle twitching"   DDD (degenerative disc disease), lumbar    Decreased libido    Depression    Diverticulitis    Diverticulosis    GERD (gastroesophageal reflux disease)    Hepatic steatosis    History of 2019 novel coronavirus disease (COVID-19) 12/28/2020   History of cardiac murmur in childhood    History of kidney stones    HLD (hyperlipidemia)    HLD (hyperlipidemia)    Hypertension    Hypogonadism in male    Long term current use of anticoagulant    a.) rivaroxaban   OSA on CPAP    Pre-diabetes    Rhinitis, allergic    Stroke (HCC) 07/23/2019   a.) LEFT posterior cor radiata. b.) residual RIGHT sided weakness    Past Surgical History:  Procedure Laterality Date   CARDIOVERSION N/A 12/08/2021   Procedure: CARDIOVERSION;  Surgeon: Yvonne Kendall, MD;  Location: [facility];  Service: Cardiovascular;  Laterality: N/A;   COLONOSCOPY     EVALUATION UNDER ANESTHESIA WITH HEMORRHOIDECTOMY N/A 04/15/2022   Procedure: EXAM UNDER ANESTHESIA WITH HEMORRHOIDECTOMY;  Surgeon: Sung Amabile, DO;  Location: [facility];   Service: General;  Laterality: N/A;   EXTERNAL EAR SURGERY     EYE SURGERY     FINGER SURGERY Right    lateration to 5 th digit   KNEE SURGERY     left eye     RECTAL EXAM UNDER ANESTHESIA N/A 04/23/2022   Procedure: RECTAL EXAM UNDER ANESTHESIA WITH LIGATION OF BLEEDING;  Surgeon: Henrene Dodge, MD;  Location: [facility];  Service: General;  Laterality: N/A;    Prior to Admission medications   Medication Sig Start Date End Date Taking? Authorizing Provider  amitriptyline (ELAVIL) 25 MG tablet TAKE 1 TABLET BY MOUTH AT BEDTIME AS NEEDED FOR SLEEP 11/07/21   Alba Cory, MD  aspirin EC 81 MG tablet Take 1 tablet (81 mg total) by mouth daily. Swallow whole. 01/23/23   Debbe Odea, MD  buPROPion (WELLBUTRIN XL) 150 MG 24 hr tablet Take 1 tablet (150 mg total) by mouth every morning. 05/24/23   Alba Cory, MD  busPIRone (BUSPAR) 10 MG tablet Take 1 tablet (10 mg total) by mouth 2 (two) times daily. 02/16/23   Alba Cory, MD  Cholecalciferol (VITAMIN D) 125 MCG (5000 UT) CAPS Take 5,000 Units by mouth daily.    [provider]  finasteride (PROSCAR) 5 MG tablet Take 1 tablet (5 mg total) by mouth daily. 02/06/23   Michiel Cowboy A, PA-C  fluticasone (FLONASE) 50 MCG/ACT nasal spray SPRAY  2 SPRAYS INTO EACH NOSTRIL EVERY DAY 05/24/23   Alba Cory, MD  furosemide (LASIX) 20 MG tablet Take 1 tablet (20 mg total) by mouth as needed. For lower extremity swelling/ PLEASE CALL OFFICE TO SCHEDULE APPOINTMENT PRIOR TO NEXT REFILL 07/24/23 10/22/23  Debbe Odea, MD  GLUCOSAMINE-CHONDROITIN PO Take 2 tablets by mouth in the morning and at bedtime.    [provider]  lidocaine (XYLOCAINE) 5 % ointment Apply 1 application. topically 3 (three) times daily as needed. Apply pea size amount to area as needed 04/26/22   Tonna Boehringer, Isami, DO  methocarbamol (ROBAXIN) 500 MG tablet Take 500 mg by mouth every 8 (eight) hours as needed for muscle spasms.    [provider]   metoprolol tartrate (LOPRESSOR) 25 MG tablet Take 1 tablet (25 mg total) by mouth 2 (two) times daily. 12/23/22 12/23/23  Debbe Odea, MD  Multiple Vitamin (MULTIVITAMIN) tablet Take 1 tablet by mouth daily.    [provider]  omega-3 acid ethyl esters (LOVAZA) 1 g capsule Take 2 capsules (2 g total) by mouth 2 (two) times daily. 05/24/23   Alba Cory, MD  pantoprazole (PROTONIX) 20 MG tablet Take 1 tablet (20 mg total) by mouth daily. 05/24/23   Alba Cory, MD  rosuvastatin (CRESTOR) 20 MG tablet Take 1 tablet (20 mg total) by mouth daily. 01/18/23   Alba Cory, MD  Saccharomyces boulardii (PROBIOTIC) 250 MG CAPS Take 1 capsule by mouth daily. 02/16/18   Alba Cory, MD  sildenafil (VIAGRA) 50 MG tablet  06/23/22   [provider]  tamsulosin (FLOMAX) 0.4 MG CAPS capsule Take 1 capsule (0.4 mg total) by mouth daily. 05/24/23   Alba Cory, MD  valsartan (DIOVAN) 80 MG tablet Take 1 tablet (80 mg total) by mouth daily. 12/23/22   Debbe Odea, MD  VISINE DRY EYE RELIEF 1 % SOLN Place 1 drop into both eyes daily as needed (dry eyes). 06/10/20   [provider]    Family History  Problem Relation Age of Onset   Asthma Mother    Heart disease Mother    Heart attack Mother    Dementia Mother    Asthma Father    Heart disease Father    Stroke Father      Social History   Tobacco Use   Smoking status: Former    Types: Pipe    Start date: 02/20/1973    Quit date: 02/20/1974    Years since quitting: 49.4   Smokeless tobacco: Never  Vaping Use   Vaping status: Never Used  Substance Use Topics   Alcohol use: No    Alcohol/week: 0.0 standard drinks of alcohol   Drug use: No    Allergies as of 07/31/2023 - Review Complete 07/28/2023  Allergen Reaction Noted   Penicillins Itching 06/30/2015    Review of Systems:    All systems reviewed and negative except where noted in HPI.   Physical Exam:  There were no vitals taken for this  visit. No LMP for male patient.  General:   Alert,  Well-developed, well-nourished, pleasant and cooperative in NAD Lungs:  Respirations even and unlabored.  Clear throughout to auscultation.   No wheezes, crackles, or rhonchi. No acute distress. Heart:  Regular rate and rhythm; no murmurs, clicks, rubs, or gallops. Abdomen:  Normal bowel sounds.  No bruits.  Soft, and non-distended without masses, hepatosplenomegaly or hernias noted.  No Tenderness.  No guarding or rebound tenderness.    Neurologic:  Alert and  oriented x3;  grossly normal neurologically. Psych:  Alert and cooperative. Normal mood and affect.  Imaging Studies: DG Abd 1 View  Result Date: 07/30/2023 CLINICAL DATA:  Follow up kidney stones. EXAM: ABDOMEN - 1 VIEW COMPARISON:  09/02/2022 and 07/25/2022. FINDINGS: The bowel gas pattern is normal. Several 2-3 mm stones overlie both kidneys. IMPRESSION: Bilateral nephrolithiasis. Electronically Signed   By: Layla Maw M.D.   On: 07/30/2023 00:40    Assessment and Plan:   VISENTE BONDE is a 72 y.o. y/o male has been referred for ***  Follow up ***  Celso Amy, PA-C    BP check ***

## 2023-07-31 ENCOUNTER — Encounter: Payer: Self-pay | Admitting: Physician Assistant

## 2023-07-31 ENCOUNTER — Ambulatory Visit (INDEPENDENT_AMBULATORY_CARE_PROVIDER_SITE_OTHER): Payer: Medicare Other | Admitting: Physician Assistant

## 2023-07-31 VITALS — BP 135/67 | HR 53 | Temp 97.9°F | Ht 70.0 in | Wt 311.0 lb

## 2023-07-31 DIAGNOSIS — Z1211 Encounter for screening for malignant neoplasm of colon: Secondary | ICD-10-CM | POA: Diagnosis not present

## 2023-07-31 DIAGNOSIS — I480 Paroxysmal atrial fibrillation: Secondary | ICD-10-CM

## 2023-07-31 DIAGNOSIS — I69351 Hemiplegia and hemiparesis following cerebral infarction affecting right dominant side: Secondary | ICD-10-CM

## 2023-07-31 MED ORDER — PEG 3350-KCL-NABCB-NACL-NASULF 236 G PO SOLR: 4000.0000 mL | Freq: Once | ORAL | 0 refills | Status: AC

## 2023-07-31 MED ORDER — NA SULFATE-K SULFATE-MG SULF 17.5-3.13-1.6 GM/177ML PO SOLN: 1.0000 | Freq: Once | ORAL | 0 refills | Status: AC

## 2023-08-01 ENCOUNTER — Telehealth: Payer: Self-pay | Admitting: Cardiology

## 2023-08-01 ENCOUNTER — Encounter: Payer: Self-pay | Admitting: Physical Therapy

## 2023-08-01 ENCOUNTER — Ambulatory Visit: Payer: Medicare Other | Admitting: Physical Therapy

## 2023-08-01 DIAGNOSIS — R262 Difficulty in walking, not elsewhere classified: Secondary | ICD-10-CM | POA: Diagnosis not present

## 2023-08-01 DIAGNOSIS — M6281 Muscle weakness (generalized): Secondary | ICD-10-CM

## 2023-08-01 DIAGNOSIS — R2681 Unsteadiness on feet: Secondary | ICD-10-CM

## 2023-08-01 DIAGNOSIS — R279 Unspecified lack of coordination: Secondary | ICD-10-CM | POA: Diagnosis not present

## 2023-08-01 DIAGNOSIS — R269 Unspecified abnormalities of gait and mobility: Secondary | ICD-10-CM | POA: Diagnosis not present

## 2023-08-01 NOTE — Therapy (Unsigned)
OUTPATIENT PHYSICAL THERAPY TREATMENT  Patient Name: MILAN DEO MRN: [id number] DOB:Jul 15, 1951, 72 y.o., male Today's Date: 08/01/2023  PCP: No primary care provider on file. REFERRING PROVIDER: Caro Laroche, DO    PT End of Session - 08/01/23 1221     Visit Number 53    Number of Visits 56    Date for PT Re-Evaluation 08/02/23    PT Start Time 1115    PT Stop Time 1200    PT Time Calculation (min) 45 min    Activity Tolerance Patient tolerated treatment well    Behavior During Therapy WFL for tasks assessed/performed                Past Medical History:  Diagnosis Date   Anxiety    Arthritis    Atrial fibrillation (HCC)    a.) CHA2DS2-VASc = 4 (age, HTN, CVA x2). b.) s/p 200 J synchronized cardioversion (DCCV) on 12/08/2021. c.) rate/rythum maintained on oral metoprolol tartrate; chronically anticoagulated using rivaroxaban.   BPH (benign prostatic hyperplasia)    Complication of anesthesia    a.)  Intolerant of propofol; causes "skin burning and involuntary muscle twitching"   DDD (degenerative disc disease), lumbar    Decreased libido    Depression    Diverticulitis    Diverticulosis    GERD (gastroesophageal reflux disease)    Hepatic steatosis    History of 2019 novel coronavirus disease (COVID-19) 12/28/2020   History of cardiac murmur in childhood    History of kidney stones    HLD (hyperlipidemia)    HLD (hyperlipidemia)    Hypertension    Hypogonadism in male    Long term current use of anticoagulant    a.) rivaroxaban   OSA on CPAP    Pre-diabetes    Rhinitis, allergic    Stroke (HCC) 07/23/2019   a.) LEFT posterior cor radiata. b.) residual RIGHT sided weakness   Past Surgical History:  Procedure Laterality Date   CARDIOVERSION N/A 12/08/2021   Procedure: CARDIOVERSION;  Surgeon: Yvonne Kendall, MD;  Location: [facility];  Service: Cardiovascular;  Laterality: N/A;   COLONOSCOPY     EVALUATION UNDER ANESTHESIA WITH  HEMORRHOIDECTOMY N/A 04/15/2022   Procedure: EXAM UNDER ANESTHESIA WITH HEMORRHOIDECTOMY;  Surgeon: Sung Amabile, DO;  Location: [facility];  Service: General;  Laterality: N/A;   EXTERNAL EAR SURGERY     EYE SURGERY     FINGER SURGERY Right    lateration to 5 th digit   KNEE SURGERY     left eye     RECTAL EXAM UNDER ANESTHESIA N/A 04/23/2022   Procedure: RECTAL EXAM UNDER ANESTHESIA WITH LIGATION OF BLEEDING;  Surgeon: Henrene Dodge, MD;  Location: [facility];  Service: General;  Laterality: N/A;   Patient Active Problem List   Diagnosis Date Noted   Gastroesophageal reflux disease without esophagitis 05/17/2022   Dysthymia 05/17/2022   History of syncope 05/17/2022   History of hemorrhoidectomy 05/17/2022   Vitamin D deficiency 05/17/2022   Lower extremity edema 05/17/2022   History of rectal bleeding 05/17/2022   Bleeding internal hemorrhoids    Morbid obesity (HCC)    Persistent atrial fibrillation (HCC) 07/14/2020   Hypertension    Hemiparesis of right dominant side as late effect of cerebral infarction (HCC) 08/13/2019   History of kidney stones 05/02/2018   ED (erectile dysfunction) 05/02/2018   Hyperglycemia 01/30/2017   Arthritis of knee, degenerative 01/30/2017   Benign prostatic hyperplasia without lower urinary tract symptoms 02/03/2016   Dyslipidemia  08/04/2015   Diverticulitis of large intestine without perforation or abscess without bleeding 06/30/2015   OSA on CPAP 06/30/2015    REFERRING DIAG: M25.511,M25.512 (ICD-10-CM) - Bilateral shoulder pain, unspecified chronicity M17.2 (ICD-10-CM) - Post-traumatic osteoarthritis of both knees  R26.81 (ICD-10-CM) - Gait instability   THERAPY DIAG:  Muscle weakness (generalized)   Unsteadiness on feet   Abnormality of gait and mobility   Unspecified lack of coordination   Other abnormalities of gait and mobility   Difficulty in walking, not elsewhere classified  PERTINENT HISTORY: See evaluation  PRECAUTIONS:  Fall  10/2:  : 38 feet x 16 laps plus 12 feet (620 feet).  HR: 65 bpm/ O2 sat. 96%.  2/19: : 68 feet x 6laps plus 10 ft. = (418)  Post VS: HR- 67 O2 Sat: 97  8/21: TUG: 21.3 sec. (No SPC), 15.8 sec. (with SPC).  8/21: DGI: 14/24  SUBJECTIVE:  08/01/2023    Ptt reports doing well this morning. Bilateral knee pain is 5/10; pt reports feeling "fatigued" today in his knees.   TODAY'S TREATMENT:   There Ex.:  Nustep L6 10 min. B UE/LE.  Seat position #10/ arm position #10.  0.76miles.  Increase cadence/no increase c/o knee pain - HR 94, O2 75 bpm post 10 minutes   TRX squats 2x 22x. Chair behind patient for cue on squat depth  Walking in clinic/ hallway/ out to car with use of SPC and moderate cuing to correct upright posture/ consistent step pattern.  Standing Nautilus: 60# lat. Pull downs with added shoulder flexion holds 22x.  Seated lat. Pull downs 60# 22x.  Standing shoulder extension 50# 2x20  Not Today Resisted walking in //-bars: 2BTB forward/backwards/lateral 5x each.  SBA for safety/ cuing.    PATIENT EDUCATION: Education details: HEP/ daily activity Person educated: Patient Education method: Medical illustrator Education comprehension: verbalized understanding and returned demonstration   HOME EXERCISE PROGRAM: Handouts given for shoulder ROM/ strengthening/ standing hip ex.    PT Short Term Goals -  06/07/23      PT SHORT TERM GOAL #1   Title Pt able to complete and ambulate >800 feet with no assistive device safely.     Baseline 10/2:  620 feet.  HR: 65 bpm/ O2 sat. 95%. 2/19: 418 feet. HR: 67 bpm/ O2 sat.97%   Time 4    Period Weeks    Status Partially met          PT Long Term Goals -  08/02/2023      PT LONG TERM GOAL #1   Title Pt will improve FOTO score to 50 to display improvement in functional mobility.    Baseline 6/7: 33.  10/23: 52   Time 8   Period Weeks    Status Goal met on 10/23   Target Date 12/05/2022      PT LONG TERM GOAL #2   Title Pt wil improve TUG score to < 12 sec with no AD to display significant imporvement in reduced risk of falls    Baseline 11/23: 26 sec (did not sit down immediately leading to higher time than he would've scored) 1/19: 14.44 sec.   3/7: 12.66 sec. 03/06/23: 11.41 seconds. 8/16: 21.3 sec. (No SPC), 15.8 sec. (with SPC)   Time 8   Period Weeks    Status Partially Met    Target Date 08/02/2023     PT LONG TERM GOAL #3   Title Pt will improve DGI to >19/24 to indicate  decreased risk of falls with household and community walking tasks    Baseline 11/23: 11/24 1/19:12/24 8/16: 14/24   Time 8    Period Weeks    Status Partially Met    Target Date 08/02/2023     PT LONG TERM GOAL #4   Title Pt. Will increase R hip flexion/ LE muscle strength 1/2 muscle grade to improve standing tolerance/ walking endurance.    Baseline See above.  4/1: B hip flexion 4+/5 MMT (good control on R hip today).     Time 8    Period Weeks    Status Partially Met    Target Date 08/02/2023         Plan -     Clinical Impression Statement Pt reports to PT with reports of usual bilateral knee pain (5/10). Session today consisted of exercises aimed to improve pt's cardiovascular endurance as well as global strength. Majority of exercises aimed to be performed in standing to improve patient's overall standing/walking tolerance and endurance. Pt continues to require multiple seated therapeutic rest breaks in between each set. Pt's vitals remained WNL throughout the session, with HR staying in low 60's to mid 70's, and O2 remaining in 93-94 range. Patient will continue to benefit from skilled PT services to progress strength and mobility, along with cardiovascular endurance.      Personal Factors and Comorbidities Comorbidity 2    Comorbidities Hypertension, Obesity    Examination-Activity Limitations Bed Mobility;Reach Overhead;Squat;Lift;Dressing;Hygiene/Grooming    Examination-Participation  Restrictions Community Activity;Yard Work    Conservation officer, historic buildings Evolving/Moderate complexity    Clinical Decision Making Moderate    Rehab Potential Fair    PT Frequency 1x / week    PT Duration 12 weeks    PT Treatment/Interventions ADLs/Self Care Home Management;Gait training;Stair training;Functional mobility training;Therapeutic activities;Therapeutic exercise;Balance training;Neuromuscular re-education;Manual techniques;Patient/family education;Electrical Stimulation;Moist Heat;Cryotherapy    PT POC Reassess HEP   PT Home Exercise Plan Progress endurance training to improve functional activity.     Consulted and Agree with Plan of Care Patient         Cena Benton, SPT 08/01/23, 12:23 PM

## 2023-08-01 NOTE — Telephone Encounter (Signed)
   Pre-operative Risk Assessment    Patient Name: Tom Underwood  DOB: 10/03/1951 MRN: [id number]      Request for Surgical Clearance    Procedure:  Colonoscopy  Date of Surgery:  Clearance 09/29/23                                 Surgeon:  not indicated Surgeon's Group or Practice Name:  [facility] Phone number:  [phone]   Fax number:  [phone]   Type of Clearance Requested:   - Medical    Type of Anesthesia:  General    Additional requests/questions:    Signed, Narda Amber   08/01/2023, 11:40 AM

## 2023-08-02 DIAGNOSIS — M170 Bilateral primary osteoarthritis of knee: Secondary | ICD-10-CM | POA: Diagnosis not present

## 2023-08-02 NOTE — Telephone Encounter (Signed)
fyi

## 2023-08-02 NOTE — Telephone Encounter (Addendum)
Pt has appt with Dr. Azucena Cecil 09/20/23. Procedure date is 09/29/23, will confirm with pre op APP if ok clearance to be addressed with MD at 09/20/23 appt.   Ok per Carlos Levering, NP clearance to be addressed at 09/20/23 appt.

## 2023-08-02 NOTE — Telephone Encounter (Signed)
   Name: Tom Underwood  DOB: [birth year]  MRN: [id number]  Primary Cardiologist: Debbe Odea, MD   Preoperative team, please contact this patient and set up a phone call appointment for further preoperative risk assessment. Please obtain consent and complete medication review. Thank you for your help.  I confirm that guidance regarding antiplatelet and oral anticoagulation therapy has been completed and, if necessary, noted below.  None requested.    Carlos Levering, NP 08/02/2023, 7:50 AM [facility]

## 2023-08-08 ENCOUNTER — Ambulatory Visit: Payer: Medicare Other | Attending: Family Medicine

## 2023-08-08 DIAGNOSIS — R2681 Unsteadiness on feet: Secondary | ICD-10-CM | POA: Diagnosis not present

## 2023-08-08 DIAGNOSIS — R269 Unspecified abnormalities of gait and mobility: Secondary | ICD-10-CM | POA: Diagnosis not present

## 2023-08-08 DIAGNOSIS — M6281 Muscle weakness (generalized): Secondary | ICD-10-CM | POA: Diagnosis not present

## 2023-08-08 DIAGNOSIS — R262 Difficulty in walking, not elsewhere classified: Secondary | ICD-10-CM | POA: Diagnosis not present

## 2023-08-08 DIAGNOSIS — R2689 Other abnormalities of gait and mobility: Secondary | ICD-10-CM | POA: Diagnosis not present

## 2023-08-08 DIAGNOSIS — R279 Unspecified lack of coordination: Secondary | ICD-10-CM | POA: Insufficient documentation

## 2023-08-08 NOTE — Therapy (Signed)
OUTPATIENT PHYSICAL THERAPY TREATMENT  Patient Name: Tom Underwood MRN: [id number] DOB:05-May-1951, 72 y.o., male Today's Date: 08/08/2023  PCP: No primary care provider on file. REFERRING PROVIDER: Caro Laroche, DO    PT End of Session - 08/08/23 1510     Visit Number 54    Number of Visits 56    Date for PT Re-Evaluation 08/02/23    PT Start Time 1345    PT Stop Time 1440    PT Time Calculation (min) 55 min    Equipment Utilized During Treatment Gait belt    Activity Tolerance Patient tolerated treatment well    Behavior During Therapy WFL for tasks assessed/performed                 Past Medical History:  Diagnosis Date   Anxiety    Arthritis    Atrial fibrillation (HCC)    a.) CHA2DS2-VASc = 4 (age, HTN, CVA x2). b.) s/p 200 J synchronized cardioversion (DCCV) on 12/08/2021. c.) rate/rythum maintained on oral metoprolol tartrate; chronically anticoagulated using rivaroxaban.   BPH (benign prostatic hyperplasia)    Complication of anesthesia    a.)  Intolerant of propofol; causes "skin burning and involuntary muscle twitching"   DDD (degenerative disc disease), lumbar    Decreased libido    Depression    Diverticulitis    Diverticulosis    GERD (gastroesophageal reflux disease)    Hepatic steatosis    History of 2019 novel coronavirus disease (COVID-19) 12/28/2020   History of cardiac murmur in childhood    History of kidney stones    HLD (hyperlipidemia)    HLD (hyperlipidemia)    Hypertension    Hypogonadism in male    Long term current use of anticoagulant    a.) rivaroxaban   OSA on CPAP    Pre-diabetes    Rhinitis, allergic    Stroke (HCC) 07/23/2019   a.) LEFT posterior cor radiata. b.) residual RIGHT sided weakness   Past Surgical History:  Procedure Laterality Date   CARDIOVERSION N/A 12/08/2021   Procedure: CARDIOVERSION;  Surgeon: Yvonne Kendall, MD;  Location: [facility];  Service: Cardiovascular;  Laterality: N/A;    COLONOSCOPY     EVALUATION UNDER ANESTHESIA WITH HEMORRHOIDECTOMY N/A 04/15/2022   Procedure: EXAM UNDER ANESTHESIA WITH HEMORRHOIDECTOMY;  Surgeon: Sung Amabile, DO;  Location: [facility];  Service: General;  Laterality: N/A;   EXTERNAL EAR SURGERY     EYE SURGERY     FINGER SURGERY Right    lateration to 5 th digit   KNEE SURGERY     left eye     RECTAL EXAM UNDER ANESTHESIA N/A 04/23/2022   Procedure: RECTAL EXAM UNDER ANESTHESIA WITH LIGATION OF BLEEDING;  Surgeon: Henrene Dodge, MD;  Location: [facility];  Service: General;  Laterality: N/A;   Patient Active Problem List   Diagnosis Date Noted   Gastroesophageal reflux disease without esophagitis 05/17/2022   Dysthymia 05/17/2022   History of syncope 05/17/2022   History of hemorrhoidectomy 05/17/2022   Vitamin D deficiency 05/17/2022   Lower extremity edema 05/17/2022   History of rectal bleeding 05/17/2022   Bleeding internal hemorrhoids    Morbid obesity (HCC)    Persistent atrial fibrillation (HCC) 07/14/2020   Hypertension    Hemiparesis of right dominant side as late effect of cerebral infarction (HCC) 08/13/2019   History of kidney stones 05/02/2018   ED (erectile dysfunction) 05/02/2018   Hyperglycemia 01/30/2017   Arthritis of knee, degenerative 01/30/2017   Benign prostatic  hyperplasia without lower urinary tract symptoms 02/03/2016   Dyslipidemia 08/04/2015   Diverticulitis of large intestine without perforation or abscess without bleeding 06/30/2015   OSA on CPAP 06/30/2015    REFERRING DIAG: M25.511,M25.512 (ICD-10-CM) - Bilateral shoulder pain, unspecified chronicity M17.2 (ICD-10-CM) - Post-traumatic osteoarthritis of both knees  R26.81 (ICD-10-CM) - Gait instability   THERAPY DIAG:  Muscle weakness (generalized)   Unsteadiness on feet   Abnormality of gait and mobility   Unspecified lack of coordination   Other abnormalities of gait and mobility   Difficulty in walking, not elsewhere  classified  PERTINENT HISTORY: See evaluation  PRECAUTIONS: Fall  10/2:  : 38 feet x 16 laps plus 12 feet (620 feet).  HR: 65 bpm/ O2 sat. 96%.  2/19: : 68 feet x 6laps plus 10 ft. = (418)  Post VS: HR- 67 O2 Sat: 97  8/21: TUG: 21.3 sec. (No SPC), 15.8 sec. (with SPC).  8/21: DGI: 14/24  SUBJECTIVE:  08/08/2023    Pt. reports doing well today, bilateral knee pain is 4/10. HEP still going well.  TODAY'S TREATMENT:   There Ex.:  Nustep L6 10 min. B UE/LE.  Seat position #10/ arm position #10.  0.51 miles.  Increase cadence/no increase c/o knee pain - HR 94, O2 65 bpm post 10 minutes  Sit-to-stands post-manual therapy: 10 reps   TRX squats 1x22, 1x14, Chair behind patient for cue on squat depth - pt cued to stop at 14 reps on second set secondary to self-reported RPE of 7/10; BP post-2nd set: 137/51, HR 55 bpm  Walking in clinic/ hallway/ out to car with use of SPC and moderate cuing to correct upright posture/consistent step pattern.  Standing Nautilus: 60# lat. Pull downs with added shoulder flexion holds 22x.  Standing shoulder extension 50# 22x2; BP post-nautilus 137/62, HR 56 bpm  Manual: L Patellar Glides in sitting: sup/inf/med/lat x 3 minutes  L tibiofemoral mobilizations: anterior/posterior x 1 minute  Not Today Resisted walking in //-bars: 2BTB forward/backwards/lateral 5x each.  SBA for safety/ cuing.    PATIENT EDUCATION: Education details: HEP/ daily activity Person educated: Patient Education method: Medical illustrator Education comprehension: verbalized understanding and returned demonstration   HOME EXERCISE PROGRAM: Handouts given for shoulder ROM/ strengthening/ standing hip ex.    PT Short Term Goals -  06/07/23      PT SHORT TERM GOAL #1   Title Pt able to complete and ambulate >800 feet with no assistive device safely.     Baseline 10/2:  620 feet.  HR: 65 bpm/ O2 sat. 95%. 2/19: 418 feet. HR: 67 bpm/ O2 sat.97%    Time 4    Period Weeks    Status Partially met          PT Long Term Goals -  08/02/2023      PT LONG TERM GOAL #1   Title Pt will improve FOTO score to 50 to display improvement in functional mobility.    Baseline 6/7: 33.  10/23: 52   Time 8   Period Weeks    Status Goal met on 10/23   Target Date 12/05/2022     PT LONG TERM GOAL #2   Title Pt wil improve TUG score to < 12 sec with no AD to display significant imporvement in reduced risk of falls    Baseline 11/23: 26 sec (did not sit down immediately leading to higher time than he would've scored) 1/19: 14.44 sec.   3/7: 12.66 sec.  03/06/23: 11.41 seconds. 8/16: 21.3 sec. (No SPC), 15.8 sec. (with SPC)   Time 8   Period Weeks    Status Partially Met    Target Date 08/02/2023     PT LONG TERM GOAL #3   Title Pt will improve DGI to >19/24 to indicate decreased risk of falls with household and community walking tasks    Baseline 11/23: 11/24 1/19:12/24 8/16: 14/24   Time 8    Period Weeks    Status Partially Met    Target Date 08/02/2023     PT LONG TERM GOAL #4   Title Pt. Will increase R hip flexion/ LE muscle strength 1/2 muscle grade to improve standing tolerance/ walking endurance.    Baseline See above.  4/1: B hip flexion 4+/5 MMT (good control on R hip today).     Time 8    Period Weeks    Status Partially Met    Target Date 08/02/2023         Plan -     Clinical Impression Statement Pt reports to PT with reports of usual bilateral knee pain (4/10); pt continues to demonstrate slow, cautious gait with SPC upon entry to clinic. Manual techniques performed at start of session to improve pt's function and decrease pain with sitting/standing/walking. Session today consisted of exercises aimed to improve pt's cardiovascular endurance as well as global strength. Majority of exercises aimed to be performed in standing to improve patient's overall standing/walking tolerance and endurance. Pt continues to require multiple seated  therapeutic rest breaks in between each set. Pt educated throughout session on importance of icing his knees post-injections, as well as importance of stretching his quads, hamstrings, and gastrocs to improve knee pain and function with ambulation. Pt additionally educated on utilizing RPE scale to determine exertion level with exercises in clinic and at home; pt cued to keep exertion levels below 6-7/10 secondary to his HR remaining low as a result of his blood pressure medication. Pt continues to demonstrates increased LE edema on R side compared to L; PT suggested to visit PCP to determine if next steps shoulder be taken regarding medications and/or swelling. Patient will continue to benefit from skilled PT services to progress strength and mobility, along with cardiovascular endurance.     Personal Factors and Comorbidities Comorbidity 2    Comorbidities Hypertension, Obesity    Examination-Activity Limitations Bed Mobility;Reach Overhead;Squat;Lift;Dressing;Hygiene/Grooming    Examination-Participation Restrictions Community Activity;Yard Work    Conservation officer, historic buildings Evolving/Moderate complexity    Clinical Decision Making Moderate    Rehab Potential Fair    PT Frequency 1x / week    PT Duration 12 weeks    PT Treatment/Interventions ADLs/Self Care Home Management;Gait training;Stair training;Functional mobility training;Therapeutic activities;Therapeutic exercise;Balance training;Neuromuscular re-education;Manual techniques;Patient/family education;Electrical Stimulation;Moist Heat;Cryotherapy    PT POC RECERT NEXT TX/ CHECK ALL GOALS.     PT Home Exercise Plan Progress endurance training to improve functional activity, follow-up on RPE scale (6-7/10), L knee/patellar mobilizations as necessary   Consulted and Agree with Plan of Care Patient           Cena Benton, SPT 08/08/23, 4:45 PM

## 2023-08-09 ENCOUNTER — Ambulatory Visit (INDEPENDENT_AMBULATORY_CARE_PROVIDER_SITE_OTHER): Payer: Medicare Other | Admitting: Urology

## 2023-08-09 DIAGNOSIS — N401 Enlarged prostate with lower urinary tract symptoms: Secondary | ICD-10-CM | POA: Diagnosis not present

## 2023-08-09 DIAGNOSIS — N400 Enlarged prostate without lower urinary tract symptoms: Secondary | ICD-10-CM

## 2023-08-09 NOTE — Progress Notes (Signed)
   08/09/2023 1:40 PM   Tom Underwood [birth year] [id number]  Reason for visit: BPH/LUTS, discuss HOLEP, gross hematuria  HPI: 72 year old male who has been followed by Dr. Lonna Cobb and Michiel Cowboy, PA for BPH, urinary symptoms, and recurrent gross hematuria felt to be secondary to BPH.  He had a cystoscopy with Dr. Lonna Cobb in October 2023 that showed obstructing lateral lobes and no suspicious bladder lesions, friable prostate, as well as a CT urogram that showed a 50 g prostate with bilateral nonobstructive stones.  PSA has been normal, most recently 0.2, corrected for finasteride 0.4.  He is on maximal medical therapy with Flomax and finasteride, has had some incomplete emptying, including that improved to with double voiding in August 2024.  His primary bothersome urinary symptoms are frequency every hour during the day, and some urgency.  He denies any significant problems with nocturia.  PVR today is elevated again at .  We had a long conversation about his symptoms today and options moving forward.  HOLEP would be the most definitive option, and would likely be the most helpful with his documented elevated PVRs and ongoing urinary frequency.  Could also consider UroLift with lower risk of side effects or incontinence, but high risk for need for reintervention in the future or persistent symptoms.  Finally, could trial an OAB medication, however with his borderline PVRs would be very hesitant to prescribe these, and would only recommend if he is adamantly opposed to surgery.  We discussed the risks and benefits of HoLEP at length.  The procedure requires general anesthesia and takes 1 to 2 hours, and a holmium laser is used to enucleate the prostate and push this tissue into the bladder.  A morcellator is then used to remove this tissue, which is sent for pathology.  The vast majority(>95%) of patients are able to discharge the same day with a catheter in place for 2 to 3  days, and will follow-up in clinic for a voiding trial.  We specifically discussed the risks of bleeding, infection, retrograde ejaculation, temporary urgency and urge incontinence, very low risk of long-term incontinence, urethral stricture/bladder neck contracture, pathologic evaluation of prostate tissue and possible detection of prostate cancer or other malignancy, and possible need for additional procedures.  He would like to think over his options, okay to schedule HOLEP if patient desires  I spent 25 total minutes on the day of the encounter including pre-visit review of the medical record, face-to-face time with the patient, and post visit ordering of labs/imaging/tests.   Sondra Come, MD  [facility] [address] [place], [place] [address] [phone]

## 2023-08-09 NOTE — Patient Instructions (Signed)

## 2023-08-16 ENCOUNTER — Ambulatory Visit: Payer: Medicare Other | Admitting: Physical Therapy

## 2023-08-16 ENCOUNTER — Encounter: Payer: Self-pay | Admitting: Physical Therapy

## 2023-08-16 DIAGNOSIS — R262 Difficulty in walking, not elsewhere classified: Secondary | ICD-10-CM | POA: Diagnosis not present

## 2023-08-16 DIAGNOSIS — R2681 Unsteadiness on feet: Secondary | ICD-10-CM

## 2023-08-16 DIAGNOSIS — R279 Unspecified lack of coordination: Secondary | ICD-10-CM

## 2023-08-16 DIAGNOSIS — R269 Unspecified abnormalities of gait and mobility: Secondary | ICD-10-CM | POA: Diagnosis not present

## 2023-08-16 DIAGNOSIS — M6281 Muscle weakness (generalized): Secondary | ICD-10-CM | POA: Diagnosis not present

## 2023-08-16 DIAGNOSIS — R2689 Other abnormalities of gait and mobility: Secondary | ICD-10-CM

## 2023-08-16 NOTE — Therapy (Signed)
OUTPATIENT PHYSICAL THERAPY TREATMENT/ RECERTIFICATION  (08/08/23 to 10/03/23)  Patient Name: Tom Underwood MRN: [id number] DOB:11-Aug-1951, 72 y.o., male Today's Date: 08/16/2023  PCP: Oneita Hurt, No REFERRING PROVIDER: Caro Laroche, DO    PT End of Session - 08/16/23 1531     Visit Number 55    Number of Visits 93    Date for PT Re-Evaluation 10/03/23    PT Start Time 1333    PT Stop Time 1439    PT Time Calculation (min) 66 min    Equipment Utilized During Treatment Gait belt    Activity Tolerance Patient tolerated treatment well    Behavior During Therapy WFL for tasks assessed/performed                Past Medical History:  Diagnosis Date   Anxiety    Arthritis    Atrial fibrillation (HCC)    a.) CHA2DS2-VASc = 4 (age, HTN, CVA x2). b.) s/p 200 J synchronized cardioversion (DCCV) on 12/08/2021. c.) rate/rythum maintained on oral metoprolol tartrate; chronically anticoagulated using rivaroxaban.   BPH (benign prostatic hyperplasia)    Complication of anesthesia    a.)  Intolerant of propofol; causes "skin burning and involuntary muscle twitching"   DDD (degenerative disc disease), lumbar    Decreased libido    Depression    Diverticulitis    Diverticulosis    GERD (gastroesophageal reflux disease)    Hepatic steatosis    History of 2019 novel coronavirus disease (COVID-19) 12/28/2020   History of cardiac murmur in childhood    History of kidney stones    HLD (hyperlipidemia)    HLD (hyperlipidemia)    Hypertension    Hypogonadism in male    Long term current use of anticoagulant    a.) rivaroxaban   OSA on CPAP    Pre-diabetes    Rhinitis, allergic    Stroke (HCC) 07/23/2019   a.) LEFT posterior cor radiata. b.) residual RIGHT sided weakness   Past Surgical History:  Procedure Laterality Date   CARDIOVERSION N/A 12/08/2021   Procedure: CARDIOVERSION;  Surgeon: Yvonne Kendall, MD;  Location: [facility];  Service: Cardiovascular;  Laterality: N/A;    COLONOSCOPY     EVALUATION UNDER ANESTHESIA WITH HEMORRHOIDECTOMY N/A 04/15/2022   Procedure: EXAM UNDER ANESTHESIA WITH HEMORRHOIDECTOMY;  Surgeon: Sung Amabile, DO;  Location: [facility];  Service: General;  Laterality: N/A;   EXTERNAL EAR SURGERY     EYE SURGERY     FINGER SURGERY Right    lateration to 5 th digit   KNEE SURGERY     left eye     RECTAL EXAM UNDER ANESTHESIA N/A 04/23/2022   Procedure: RECTAL EXAM UNDER ANESTHESIA WITH LIGATION OF BLEEDING;  Surgeon: Henrene Dodge, MD;  Location: [facility];  Service: General;  Laterality: N/A;   Patient Active Problem List   Diagnosis Date Noted   Gastroesophageal reflux disease without esophagitis 05/17/2022   Dysthymia 05/17/2022   History of syncope 05/17/2022   History of hemorrhoidectomy 05/17/2022   Vitamin D deficiency 05/17/2022   Lower extremity edema 05/17/2022   History of rectal bleeding 05/17/2022   Bleeding internal hemorrhoids    Morbid obesity (HCC)    Persistent atrial fibrillation (HCC) 07/14/2020   Hypertension    Hemiparesis of right dominant side as late effect of cerebral infarction (HCC) 08/13/2019   History of kidney stones 05/02/2018   ED (erectile dysfunction) 05/02/2018   Hyperglycemia 01/30/2017   Arthritis of knee, degenerative 01/30/2017   Benign prostatic  hyperplasia without lower urinary tract symptoms 02/03/2016   Dyslipidemia 08/04/2015   Diverticulitis of large intestine without perforation or abscess without bleeding 06/30/2015   OSA on CPAP 06/30/2015    REFERRING DIAG: M25.511,M25.512 (ICD-10-CM) - Bilateral shoulder pain, unspecified chronicity M17.2 (ICD-10-CM) - Post-traumatic osteoarthritis of both knees  R26.81 (ICD-10-CM) - Gait instability   THERAPY DIAG:  Muscle weakness (generalized)   Unsteadiness on feet   Abnormality of gait and mobility   Unspecified lack of coordination   Other abnormalities of gait and mobility   Difficulty in walking, not elsewhere  classified  PERTINENT HISTORY: See evaluation  PRECAUTIONS: Fall  10/2:  : 38 feet x 16 laps plus 12 feet (620 feet).  HR: 65 bpm/ O2 sat. 96%.  2/19: : 68 feet x 6laps plus 10 ft. = (418)  Post VS: HR- 67 O2 Sat: 97  8/21: TUG: 21.3 sec. (No SPC), 15.8 sec. (with SPC).  8/21: DGI: 14/24  SUBJECTIVE:  08/16/2023    Pt. reports doing well today, bilateral knee pain is 2-3/10. Pt. Has finished injections in knees with benefit in pain reported.  Pt. Continues to c/o heaviness in B lower legs with all tasks.  Pt. Wearing lower leg compression stockings today.  Pt. Has trip to [facility] with wife next week.    TODAY'S TREATMENT:              There Ex.:  Nustep L6 10 min. B UE/LE.  Seat position #10/ arm position #10.  0.55 miles.  Increase cadence/no increase c/o knee pain - HR 94, O2 65 bpm post 10 minutes.   TRX squats 22x2, Chair behind patient for cue on squat depth.  Standing Nautilus: 60# lat. Pull downs with added shoulder flexion holds 22x.  Standing shoulder extension 50# 22x2.   Seated B shoulder flexion/ abduction 20x.  Seated LAQ/marching/ heel raises.  Assessment of B lower leg/ ankle swelling and use of compression socks.    Walking in clinic/ hallway/ out to car with use of SPC and moderate cuing to correct upright posture/consistent step pattern.  Walking through grass/mulch/sidewalk and up/down curb.    See updated goals.    No manual tx. Today.    PATIENT EDUCATION: Education details: HEP/ daily activity Person educated: Patient Education method: Medical illustrator Education comprehension: verbalized understanding and returned demonstration   HOME EXERCISE PROGRAM: Handouts given for shoulder ROM/ strengthening/ standing hip ex.    PT Short Term Goals -  08/29/23      PT SHORT TERM GOAL #1   Title Pt able to complete and ambulate >800 feet with no assistive device safely.     Baseline 10/2:  620 feet.  HR: 65 bpm/ O2 sat.  95%. 2/19: 418 feet. HR: 67 bpm/ O2 sat.97%   Time 4    Period Weeks    Status Partially met          PT Long Term Goals -  08/02/2023      PT LONG TERM GOAL #1   Title Pt. Able to complete 10 steps with recip. Pattern and 1 handrail assist with mod. Independence safely.    Baseline Pt. Limited with R hip flexion/ recip. Gait pattern and requires heavy UE assist.  Pt. Is considering getting a ramp access at home.    Time 8   Period Weeks    Status Ongoing   Target Date 10/03/2023     PT LONG TERM GOAL #2   Title  Pt wil improve TUG score to < 12 sec with no AD to display significant imporvement in reduced risk of falls    Baseline 11/23: 26 sec (did not sit down immediately leading to higher time than he would've scored) 1/19: 14.44 sec.   3/7: 12.66 sec. 03/06/23: 11.41 seconds. 8/16: 21.3 sec. (No SPC), 15.8 sec. (with SPC)   Time 8   Period Weeks    Status Partially Met    Target Date 10/03/2023     PT LONG TERM GOAL #3   Title Pt will improve DGI to >19/24 to indicate decreased risk of falls with household and community walking tasks    Baseline 11/23: 11/24 1/19:12/24 8/16: 14/24   Time 8    Period Weeks    Status Partially Met    Target Date 10/03/2023     PT LONG TERM GOAL #4   Title Pt. Will increase R hip flexion/ LE muscle strength 1/2 muscle grade to improve standing tolerance/ walking endurance.    Baseline See above.  B hip flexion 4+/5 MMT (good control on R hip today).     Time 8    Period Weeks    Status Partially Met    Target Date 10/03/2023         Plan -     Clinical Impression Statement Pt reports to PT with reports of less overall bilateral knee pain (3-4/10); pt continues to demonstrate slow, cautious gait with SPC upon entry to clinic.  Session today consisted of exercises aimed to improve pt's cardiovascular endurance as well as global strength. Majority of exercises aimed to be performed in standing to improve patient's overall standing/walking  tolerance and endurance. Pt continues to require multiple seated therapeutic rest breaks in between each set. Pt educated throughout session on importance of icing his knees post-injections, as well as importance of stretching his quads, hamstrings, and gastrocs to improve knee pain and function with ambulation. Pt additionally educated on utilizing RPE scale to determine exertion level with exercises in clinic and at home; pt cued to keep exertion levels below 6-7/10 secondary to his HR remaining low as a result of his blood pressure medication. Pt continues to demonstrates increased LE edema on R side compared to L and wearing compression stockings today.  Patient will continue to benefit from skilled PT services to progress strength and mobility, along with cardiovascular endurance.     Personal Factors and Comorbidities Comorbidity 2    Comorbidities Hypertension, Obesity    Examination-Activity Limitations Bed Mobility;Reach Overhead;Squat;Lift;Dressing;Hygiene/Grooming    Examination-Participation Restrictions Community Activity;Yard Work    Conservation officer, historic buildings Evolving/Moderate complexity    Clinical Decision Making Moderate    Rehab Potential Fair    PT Frequency 1x / week    PT Duration 12 weeks    PT Treatment/Interventions ADLs/Self Care Home Management;Gait training;Stair training;Functional mobility training;Therapeutic activities;Therapeutic exercise;Balance training;Neuromuscular re-education;Manual techniques;Patient/family education;Electrical Stimulation;Moist Heat;Cryotherapy    PT POC Discuss trip to [facility] [place]/ activity level   PT Home Exercise Plan Progress endurance training to improve functional activity, follow-up on RPE scale (6-7/10), L knee/patellar mobilizations as necessary   Consulted and Agree with Plan of Care Patient          Cammie Mcgee, PT, DPT # [phone] 08/16/23, 3:33 PM

## 2023-08-23 ENCOUNTER — Encounter: Payer: Medicare Other | Admitting: Physical Therapy

## 2023-08-27 ENCOUNTER — Other Ambulatory Visit: Payer: Self-pay | Admitting: Family Medicine

## 2023-08-27 DIAGNOSIS — F419 Anxiety disorder, unspecified: Secondary | ICD-10-CM

## 2023-08-27 DIAGNOSIS — F341 Dysthymic disorder: Secondary | ICD-10-CM

## 2023-08-28 ENCOUNTER — Telehealth: Payer: Self-pay

## 2023-08-28 NOTE — Telephone Encounter (Signed)
Pt has questions in ref to upcoming procedure

## 2023-08-29 ENCOUNTER — Other Ambulatory Visit: Payer: Self-pay | Admitting: Family Medicine

## 2023-08-29 DIAGNOSIS — F341 Dysthymic disorder: Secondary | ICD-10-CM

## 2023-08-29 DIAGNOSIS — F419 Anxiety disorder, unspecified: Secondary | ICD-10-CM

## 2023-08-29 NOTE — Telephone Encounter (Signed)
I called and confirmed with the pt that he is still sowles pt

## 2023-08-30 ENCOUNTER — Ambulatory Visit: Payer: Medicare Other | Admitting: Physical Therapy

## 2023-09-05 ENCOUNTER — Telehealth: Payer: Self-pay | Admitting: Cardiology

## 2023-09-05 ENCOUNTER — Other Ambulatory Visit: Payer: Self-pay

## 2023-09-05 ENCOUNTER — Telehealth: Payer: Self-pay

## 2023-09-05 DIAGNOSIS — Z1211 Encounter for screening for malignant neoplasm of colon: Secondary | ICD-10-CM

## 2023-09-05 NOTE — Telephone Encounter (Signed)
Procedure clearance faxed to Cardiology, pt has upcoming appt 09/20/23

## 2023-09-05 NOTE — Telephone Encounter (Signed)
Pre-operative Risk Assessment    Patient Name: Tom Underwood  DOB: [birth year] MRN: [id number]      Request for Surgical Clearance    Procedure:   colonoscopy  Date of Surgery:  Clearance 09/29/23                                 Surgeon:  not indicated Surgeon's Group or Practice Name:  [facility] Phone number:  [phone] Fax number:  [phone]   Type of Clearance Requested:   - Medical    Type of Anesthesia:  General    Additional requests/questions:    SignedShawna Orleans   09/05/2023, 11:27 AM

## 2023-09-05 NOTE — Telephone Encounter (Signed)
Name: Tom Underwood  DOB: [birth year]  MRN: [id number]  Primary Cardiologist: Debbe Odea, MD  Chart reviewed as part of pre-operative protocol coverage. The patient has an upcoming visit scheduled with Dr. Azucena Cecil on 09/20/23 at which time clearance can be addressed in case there are any issues that would impact surgical recommendations.   I added preop FYI to appointment note so that provider is aware to address at time of outpatient visit.  Per office protocol the cardiology provider should forward their finalized clearance decision and recommendations regarding antiplatelet therapy to the requesting party below.     I will route this message as FYI to requesting party and remove this message from the preop box as separate preop APP input not needed at this time.   Please call with any questions.  Napoleon Form, Leodis Rains, NP  09/05/2023, 11:32 AM

## 2023-09-06 ENCOUNTER — Ambulatory Visit: Payer: Medicare Other | Attending: Family Medicine | Admitting: Physical Therapy

## 2023-09-06 ENCOUNTER — Encounter: Payer: Self-pay | Admitting: Physical Therapy

## 2023-09-06 DIAGNOSIS — R262 Difficulty in walking, not elsewhere classified: Secondary | ICD-10-CM | POA: Diagnosis present

## 2023-09-06 DIAGNOSIS — M6281 Muscle weakness (generalized): Secondary | ICD-10-CM | POA: Insufficient documentation

## 2023-09-06 DIAGNOSIS — R279 Unspecified lack of coordination: Secondary | ICD-10-CM | POA: Insufficient documentation

## 2023-09-06 DIAGNOSIS — R269 Unspecified abnormalities of gait and mobility: Secondary | ICD-10-CM | POA: Diagnosis present

## 2023-09-06 DIAGNOSIS — R2681 Unsteadiness on feet: Secondary | ICD-10-CM | POA: Insufficient documentation

## 2023-09-06 NOTE — Therapy (Signed)
OUTPATIENT PHYSICAL THERAPY TREATMENT  Patient Name: Tom Underwood MRN: [id number] DOB:[birth year], 72 y.o., male Today's Date: 09/06/2023  PCP: Alba Cory, MD REFERRING PROVIDER: Caro Laroche, DO    PT End of Session - 09/06/23 1519     Visit Number 56    Number of Visits 63    Date for PT Re-Evaluation 10/03/23    PT Start Time 1345    PT Stop Time 1440    PT Time Calculation (min) 55 min    Activity Tolerance Patient tolerated treatment well    Behavior During Therapy WFL for tasks assessed/performed                 Past Medical History:  Diagnosis Date   Anxiety    Arthritis    Atrial fibrillation (HCC)    a.) CHA2DS2-VASc = 4 (age, HTN, CVA x2). b.) s/p 200 J synchronized cardioversion (DCCV) on 12/08/2021. c.) rate/rythum maintained on oral metoprolol tartrate; chronically anticoagulated using rivaroxaban.   BPH (benign prostatic hyperplasia)    Complication of anesthesia    a.)  Intolerant of propofol; causes "skin burning and involuntary muscle twitching"   DDD (degenerative disc disease), lumbar    Decreased libido    Depression    Diverticulitis    Diverticulosis    GERD (gastroesophageal reflux disease)    Hepatic steatosis    History of 2019 novel coronavirus disease (COVID-19) 12/28/2020   History of cardiac murmur in childhood    History of kidney stones    HLD (hyperlipidemia)    HLD (hyperlipidemia)    Hypertension    Hypogonadism in male    Long term current use of anticoagulant    a.) rivaroxaban   OSA on CPAP    Pre-diabetes    Rhinitis, allergic    Stroke (HCC) 07/23/2019   a.) LEFT posterior cor radiata. b.) residual RIGHT sided weakness   Past Surgical History:  Procedure Laterality Date   CARDIOVERSION N/A 12/08/2021   Procedure: CARDIOVERSION;  Surgeon: Yvonne Kendall, MD;  Location: [facility];  Service: Cardiovascular;  Laterality: N/A;   COLONOSCOPY     EVALUATION UNDER ANESTHESIA WITH HEMORRHOIDECTOMY N/A  04/15/2022   Procedure: EXAM UNDER ANESTHESIA WITH HEMORRHOIDECTOMY;  Surgeon: Sung Amabile, DO;  Location: [facility];  Service: General;  Laterality: N/A;   EXTERNAL EAR SURGERY     EYE SURGERY     FINGER SURGERY Right    lateration to 5 th digit   KNEE SURGERY     left eye     RECTAL EXAM UNDER ANESTHESIA N/A 04/23/2022   Procedure: RECTAL EXAM UNDER ANESTHESIA WITH LIGATION OF BLEEDING;  Surgeon: Henrene Dodge, MD;  Location: [facility];  Service: General;  Laterality: N/A;   Patient Active Problem List   Diagnosis Date Noted   Gastroesophageal reflux disease without esophagitis 05/17/2022   Dysthymia 05/17/2022   History of syncope 05/17/2022   History of hemorrhoidectomy 05/17/2022   Vitamin D deficiency 05/17/2022   Lower extremity edema 05/17/2022   History of rectal bleeding 05/17/2022   Bleeding internal hemorrhoids    Morbid obesity (HCC)    Persistent atrial fibrillation (HCC) 07/14/2020   Hypertension    Hemiparesis of right dominant side as late effect of cerebral infarction (HCC) 08/13/2019   History of kidney stones 05/02/2018   ED (erectile dysfunction) 05/02/2018   Hyperglycemia 01/30/2017   Arthritis of knee, degenerative 01/30/2017   Benign prostatic hyperplasia without lower urinary tract symptoms 02/03/2016   Dyslipidemia 08/04/2015  Diverticulitis of large intestine without perforation or abscess without bleeding 06/30/2015   OSA on CPAP 06/30/2015    REFERRING DIAG: M25.511,M25.512 (ICD-10-CM) - Bilateral shoulder pain, unspecified chronicity M17.2 (ICD-10-CM) - Post-traumatic osteoarthritis of both knees  R26.81 (ICD-10-CM) - Gait instability   THERAPY DIAG:  Muscle weakness (generalized)   Unsteadiness on feet   Abnormality of gait and mobility   Unspecified lack of coordination   Other abnormalities of gait and mobility   Difficulty in walking, not elsewhere classified  PERTINENT HISTORY: See evaluation  PRECAUTIONS: Fall  10/2:  : 38  feet x 16 laps plus 12 feet (620 feet).  HR: 65 bpm/ O2 sat. 96%.  2/19: : 68 feet x 6laps plus 10 ft. = (418)  Post VS: HR- 67 O2 Sat: 97  8/21: TUG: 21.3 sec. (No SPC), 15.8 sec. (with SPC).  8/21: DGI: 14/24  SUBJECTIVE:  09/06/2023    Pt. Continues to c/o heaviness in B lower legs with all tasks.  Pt. Wearing lower leg compression stockings today.  Pt. Reports no new complaints after recent trip to [facility].    TODAY'S TREATMENT:              There Ex.:  Nustep L6 10 min. B UE/LE.  Seat position #10/ arm position #10.  0.52 miles.  Increase cadence/no increase c/o knee pain - HR 69 bpm, O2 95%, BP 139/55  post 10 minutes.  : 285 feet, use of SPC, 7/10 exertion rating at end, vitals remained WNL  TUG: 17.83 seconds with SPC  Sit-to-stands: 5 reps in 13 seconds on first attempt, 5 reps in 10 seconds on second attempt  Standing Nautilus: 60# lat. Pull downs with added shoulder flexion holds 22x2.      Not today: Seated B shoulder flexion/ abduction 20x.  Seated LAQ/marching/ heel raises.  Assessment of B lower leg/ ankle swelling and use of compression socks.   Walking in clinic/ hallway/ out to car with use of SPC and moderate cuing to correct upright posture/consistent step pattern.  Walking through grass/mulch/sidewalk and up/down curb.   TRX squats 22x2, Chair behind patient for cue on squat depth.   PATIENT EDUCATION: Education details: HEP/ daily activity Person educated: Patient Education method: Medical illustrator Education comprehension: verbalized understanding and returned demonstration   HOME EXERCISE PROGRAM: Handouts given for shoulder ROM/ strengthening/ standing hip ex.    PT Short Term Goals -  08/29/23      PT SHORT TERM GOAL #1   Title Pt able to complete and ambulate >800 feet with no assistive device safely.     Baseline 10/2:  620 feet.  HR: 65 bpm/ O2 sat. 95%. 2/19: 418 feet. HR: 67 bpm/ O2 sat.97%   Time 4     Period Weeks    Status Partially met          PT Long Term Goals - 10/03/23      PT LONG TERM GOAL #1   Title Pt. Able to complete 10 steps with recip. Pattern and 1 handrail assist with mod. Independence safely.    Baseline Pt. Limited with R hip flexion/ recip. Gait pattern and requires heavy UE assist.  Pt. Is considering getting a ramp access at home.    Time 8   Period Weeks    Status Ongoing   Target Date 10/03/2023     PT LONG TERM GOAL #2   Title Pt wil improve TUG score to < 12 sec  with no AD to display significant imporvement in reduced risk of falls    Baseline 11/23: 26 sec (did not sit down immediately leading to higher time than he would've scored) 1/19: 14.44 sec.   3/7: 12.66 sec. 03/06/23: 11.41 seconds. 8/16: 21.3 sec. (No SPC), 15.8 sec. (with SPC)   Time 8   Period Weeks    Status Partially Met    Target Date 10/03/2023     PT LONG TERM GOAL #3   Title Pt will improve DGI to >19/24 to indicate decreased risk of falls with household and community walking tasks    Baseline 11/23: 11/24 1/19:12/24 8/16: 14/24   Time 8    Period Weeks    Status Partially Met    Target Date 10/03/2023     PT LONG TERM GOAL #4   Title Pt. Will increase R hip flexion/ LE muscle strength 1/2 muscle grade to improve standing tolerance/ walking endurance.    Baseline See above.  B hip flexion 4+/5 MMT (good control on R hip today).     Time 8    Period Weeks    Status Partially Met    Target Date 10/03/2023         Plan -     Clinical Impression Statement Pt reports to PT for the first time since being on vacation; he was experiencing increased "heaviness" and fatigue in his LEs today. Nustep performed at start of session to give pt opportunity to warm-up and improve cardiovascular endurance. and TUG performed today to reassess pt's endurance and balance. Pt continues to demonstrate overall decreased cardiovascular and LE endurance; he was able to ambulate 285 feet with a  SPC and reported a 7/10 exertion rating afterwards. Pt's time on TUG of 17.83 seconds places him at higher risk for falls. Session ended with nautilus exercises to improve UE ROM and strength. Patient will continue to benefit from skilled PT services to progress strength and mobility, along with cardiovascular endurance.     Personal Factors and Comorbidities Comorbidity 2    Comorbidities Hypertension, Obesity    Examination-Activity Limitations Bed Mobility;Reach Overhead;Squat;Lift;Dressing;Hygiene/Grooming    Examination-Participation Restrictions Community Activity;Yard Work    Conservation officer, historic buildings Evolving/Moderate complexity    Clinical Decision Making Moderate    Rehab Potential Fair    PT Frequency 1x / week    PT Duration 12 weeks    PT Treatment/Interventions ADLs/Self Care Home Management;Gait training;Stair training;Functional mobility training;Therapeutic activities;Therapeutic exercise;Balance training;Neuromuscular re-education;Manual techniques;Patient/family education;Electrical Stimulation;Moist Heat;Cryotherapy    PT POC Discuss trip to [facility] [place]/ activity level   PT Home Exercise Plan Progress endurance training to improve functional activity, follow-up on RPE scale (6-7/10), L knee/patellar mobilizations as necessary   Consulted and Agree with Plan of Care Patient         Cammie Mcgee, PT, DPT # [phone]  Cena Benton, SPT 09/06/23, 3:24 PM

## 2023-09-13 ENCOUNTER — Encounter: Payer: Medicare Other | Admitting: Physical Therapy

## 2023-09-13 ENCOUNTER — Ambulatory Visit: Payer: Medicare Other | Admitting: Physical Therapy

## 2023-09-13 ENCOUNTER — Encounter: Payer: Self-pay | Admitting: Physical Therapy

## 2023-09-13 DIAGNOSIS — R269 Unspecified abnormalities of gait and mobility: Secondary | ICD-10-CM | POA: Diagnosis not present

## 2023-09-13 DIAGNOSIS — R279 Unspecified lack of coordination: Secondary | ICD-10-CM | POA: Diagnosis not present

## 2023-09-13 DIAGNOSIS — R2681 Unsteadiness on feet: Secondary | ICD-10-CM

## 2023-09-13 DIAGNOSIS — R262 Difficulty in walking, not elsewhere classified: Secondary | ICD-10-CM | POA: Diagnosis not present

## 2023-09-13 DIAGNOSIS — M6281 Muscle weakness (generalized): Secondary | ICD-10-CM

## 2023-09-13 NOTE — Therapy (Unsigned)
OUTPATIENT PHYSICAL THERAPY TREATMENT  Patient Name: Tom Underwood MRN: [id number] DOB:08-23-1951, 72 y.o., male Today's Date: 09/13/2023  PCP: Alba Cory, MD REFERRING PROVIDER: Caro Laroche, DO    PT End of Session - 09/13/23 1040     Visit Number 57    Number of Visits 63    Date for PT Re-Evaluation 10/03/23    PT Start Time 1037    PT Stop Time 1115    PT Time Calculation (min) 38 min    Equipment Utilized During Treatment Gait belt    Activity Tolerance Patient tolerated treatment well    Behavior During Therapy WFL for tasks assessed/performed                  Past Medical History:  Diagnosis Date   Anxiety    Arthritis    Atrial fibrillation (HCC)    a.) CHA2DS2-VASc = 4 (age, HTN, CVA x2). b.) s/p 200 J synchronized cardioversion (DCCV) on 12/08/2021. c.) rate/rythum maintained on oral metoprolol tartrate; chronically anticoagulated using rivaroxaban.   BPH (benign prostatic hyperplasia)    Complication of anesthesia    a.)  Intolerant of propofol; causes "skin burning and involuntary muscle twitching"   DDD (degenerative disc disease), lumbar    Decreased libido    Depression    Diverticulitis    Diverticulosis    GERD (gastroesophageal reflux disease)    Hepatic steatosis    History of 2019 novel coronavirus disease (COVID-19) 12/28/2020   History of cardiac murmur in childhood    History of kidney stones    HLD (hyperlipidemia)    HLD (hyperlipidemia)    Hypertension    Hypogonadism in male    Long term current use of anticoagulant    a.) rivaroxaban   OSA on CPAP    Pre-diabetes    Rhinitis, allergic    Stroke (HCC) 07/23/2019   a.) LEFT posterior cor radiata. b.) residual RIGHT sided weakness   Past Surgical History:  Procedure Laterality Date   CARDIOVERSION N/A 12/08/2021   Procedure: CARDIOVERSION;  Surgeon: Yvonne Kendall, MD;  Location: [facility];  Service: Cardiovascular;  Laterality: N/A;   COLONOSCOPY      EVALUATION UNDER ANESTHESIA WITH HEMORRHOIDECTOMY N/A 04/15/2022   Procedure: EXAM UNDER ANESTHESIA WITH HEMORRHOIDECTOMY;  Surgeon: Sung Amabile, DO;  Location: [facility];  Service: General;  Laterality: N/A;   EXTERNAL EAR SURGERY     EYE SURGERY     FINGER SURGERY Right    lateration to 5 th digit   KNEE SURGERY     left eye     RECTAL EXAM UNDER ANESTHESIA N/A 04/23/2022   Procedure: RECTAL EXAM UNDER ANESTHESIA WITH LIGATION OF BLEEDING;  Surgeon: Henrene Dodge, MD;  Location: [facility];  Service: General;  Laterality: N/A;   Patient Active Problem List   Diagnosis Date Noted   Gastroesophageal reflux disease without esophagitis 05/17/2022   Dysthymia 05/17/2022   History of syncope 05/17/2022   History of hemorrhoidectomy 05/17/2022   Vitamin D deficiency 05/17/2022   Lower extremity edema 05/17/2022   History of rectal bleeding 05/17/2022   Bleeding internal hemorrhoids    Morbid obesity (HCC)    Persistent atrial fibrillation (HCC) 07/14/2020   Hypertension    Hemiparesis of right dominant side as late effect of cerebral infarction (HCC) 08/13/2019   History of kidney stones 05/02/2018   ED (erectile dysfunction) 05/02/2018   Hyperglycemia 01/30/2017   Arthritis of knee, degenerative 01/30/2017   Benign prostatic hyperplasia without  lower urinary tract symptoms 02/03/2016   Dyslipidemia 08/04/2015   Diverticulitis of large intestine without perforation or abscess without bleeding 06/30/2015   OSA on CPAP 06/30/2015    REFERRING DIAG: M25.511,M25.512 (ICD-10-CM) - Bilateral shoulder pain, unspecified chronicity M17.2 (ICD-10-CM) - Post-traumatic osteoarthritis of both knees  R26.81 (ICD-10-CM) - Gait instability   THERAPY DIAG:  Muscle weakness (generalized)   Unsteadiness on feet   Abnormality of gait and mobility   Unspecified lack of coordination   Other abnormalities of gait and mobility   Difficulty in walking, not elsewhere classified  PERTINENT HISTORY:  See evaluation  PRECAUTIONS: Fall  10/2:  : 38 feet x 16 laps plus 12 feet (620 feet).  HR: 65 bpm/ O2 sat. 96%.  2/19: : 68 feet x 6laps plus 10 ft. = (418)  Post VS: HR- 67 O2 Sat: 97  8/21: TUG: 21.3 sec. (No SPC), 15.8 sec. (with SPC).  8/21: DGI: 14/24  SUBJECTIVE:  09/13/2023    Pt. Continues to c/o heaviness in B lower legs, he says they feel a little heavier than last week. No pain today.  Pt got a new ramp installed at his house.  TODAY'S TREATMENT:              There Ex.:  Resisted Gait with Black TB: 3x each direction - 7/10 exertion scale; pt reported mild increase in R hip pain when side-stepping to the left   Walking High Knees in // bars: 3 laps with no UE support  Standing Nautilus: 70# lat. Pull downs with added shoulder flexion holds 22x2.     Not today: Nustep L6 10 min. B UE/LE.  Seat position #10/ arm position #10.  0.52 miles.  Increase cadence/no increase c/o knee pain - HR 69 bpm, O2 95%, BP 139/55  post 10 minutes. Seated B shoulder flexion/ abduction 20x.  Seated LAQ/marching/ heel raises.  Assessment of B lower leg/ ankle swelling and use of compression socks.   Walking in clinic/ hallway/ out to car with use of SPC and moderate cuing to correct upright posture/consistent step pattern.  Walking through grass/mulch/sidewalk and up/down curb.   TRX squats 22x2, Chair behind patient for cue on squat depth.   PATIENT EDUCATION: Education details: HEP/ daily activity Person educated: Patient Education method: Medical illustrator Education comprehension: verbalized understanding and returned demonstration   HOME EXERCISE PROGRAM: Handouts given for shoulder ROM/ strengthening/ standing hip ex.    PT Short Term Goals -  08/29/23      PT SHORT TERM GOAL #1   Title Pt able to complete and ambulate >800 feet with no assistive device safely.     Baseline 10/2:  620 feet.  HR: 65 bpm/ O2 sat. 95%. 2/19: 418 feet. HR: 67 bpm/  O2 sat.97%   Time 4    Period Weeks    Status Partially met          PT Long Term Goals - 10/03/23      PT LONG TERM GOAL #1   Title Pt. Able to complete 10 steps with recip. Pattern and 1 handrail assist with mod. Independence safely.    Baseline Pt. Limited with R hip flexion/ recip. Gait pattern and requires heavy UE assist.  Pt. Is considering getting a ramp access at home.    Time 8   Period Weeks    Status Ongoing   Target Date 10/03/2023     PT LONG TERM GOAL #2   Title Pt  wil improve TUG score to < 12 sec with no AD to display significant imporvement in reduced risk of falls    Baseline 11/23: 26 sec (did not sit down immediately leading to higher time than he would've scored) 1/19: 14.44 sec.   3/7: 12.66 sec. 03/06/23: 11.41 seconds. 8/16: 21.3 sec. (No SPC), 15.8 sec. (with SPC)   Time 8   Period Weeks    Status Partially Met    Target Date 10/03/2023     PT LONG TERM GOAL #3   Title Pt will improve DGI to >19/24 to indicate decreased risk of falls with household and community walking tasks    Baseline 11/23: 11/24 1/19:12/24 8/16: 14/24   Time 8    Period Weeks    Status Partially Met    Target Date 10/03/2023     PT LONG TERM GOAL #4   Title Pt. Will increase R hip flexion/ LE muscle strength 1/2 muscle grade to improve standing tolerance/ walking endurance.    Baseline See above.  B hip flexion 4+/5 MMT (good control on R hip today).     Time 8    Period Weeks    Status Partially Met    Target Date 10/03/2023         Plan -     Clinical Impression Statement Pt reports to PT with continued feelings of "heaviness" in his LE's. Today's session consisted of exercises aimed to improve pt's hip, core, and back strength, as well as his walking/activity tolerance. Pt requires cues to determine exertion level throughout his exercises; due to pt's medications his HR does not change significantly during exercise so the RPE scale is utilized to determine his exertion  level. Pt demonstrates heavy cardiovascular fatigue at the end of and throughout resisted gait exercise, but was able to get through the entire exercise with only a short standing rest break. Pt does require, however, several seated therapeutic rest breaks throughout the session which limit the amount of exercise that can be done each session. Patient will continue to benefit from skilled PT services to progress strength and mobility, along with cardiovascular endurance.     Personal Factors and Comorbidities Comorbidity 2    Comorbidities Hypertension, Obesity    Examination-Activity Limitations Bed Mobility;Reach Overhead;Squat;Lift;Dressing;Hygiene/Grooming    Examination-Participation Restrictions Community Activity;Yard Work    Conservation officer, historic buildings Evolving/Moderate complexity    Clinical Decision Making Moderate    Rehab Potential Fair    PT Frequency 1x / week    PT Duration 12 weeks    PT Treatment/Interventions ADLs/Self Care Home Management;Gait training;Stair training;Functional mobility training;Therapeutic activities;Therapeutic exercise;Balance training;Neuromuscular re-education;Manual techniques;Patient/family education;Electrical Stimulation;Moist Heat;Cryotherapy    PT POC Discuss trip to [facility] [place]/ activity level   PT Home Exercise Plan Progress endurance training to improve functional activity, follow-up on RPE scale (6-7/10), L knee/patellar mobilizations as necessary   Consulted and Agree with Plan of Care Patient          Cammie Mcgee, PT, DPT # [id number] Cena Benton, SPT 09/13/23, 12:16 PM

## 2023-09-18 ENCOUNTER — Other Ambulatory Visit: Payer: Self-pay

## 2023-09-18 MED ORDER — VALSARTAN 80 MG PO TABS: 80.0000 mg | ORAL_TABLET | Freq: Every day | ORAL | 0 refills | Status: DC

## 2023-09-18 NOTE — Telephone Encounter (Signed)
Requested Prescriptions   Signed Prescriptions Disp Refills   valsartan (DIOVAN) 80 MG tablet 90 tablet 0    Sig: Take 1 tablet (80 mg total) by mouth daily.    Authorizing Provider: Debbe Odea    Ordering User: Guerry Minors

## 2023-09-19 ENCOUNTER — Encounter: Payer: Self-pay | Admitting: Physical Therapy

## 2023-09-19 ENCOUNTER — Ambulatory Visit: Payer: Medicare Other

## 2023-09-19 DIAGNOSIS — R279 Unspecified lack of coordination: Secondary | ICD-10-CM | POA: Diagnosis not present

## 2023-09-19 DIAGNOSIS — R262 Difficulty in walking, not elsewhere classified: Secondary | ICD-10-CM

## 2023-09-19 DIAGNOSIS — R269 Unspecified abnormalities of gait and mobility: Secondary | ICD-10-CM

## 2023-09-19 DIAGNOSIS — M6281 Muscle weakness (generalized): Secondary | ICD-10-CM | POA: Diagnosis not present

## 2023-09-19 DIAGNOSIS — R2681 Unsteadiness on feet: Secondary | ICD-10-CM

## 2023-09-19 NOTE — Therapy (Signed)
OUTPATIENT PHYSICAL THERAPY TREATMENT  Patient Name: Tom Underwood MRN: [id number] DOB:[birth year], 72 y.o., male Today's Date: 09/19/2023  PCP: Alba Cory, MD REFERRING PROVIDER: Caro Laroche, DO    PT End of Session - 09/19/23 1301     Visit Number 58    Number of Visits 63    Date for PT Re-Evaluation 10/03/23    PT Start Time 1258    PT Stop Time 1348    PT Time Calculation (min) 50 min    Activity Tolerance Patient tolerated treatment well    Behavior During Therapy WFL for tasks assessed/performed                   Past Medical History:  Diagnosis Date   Anxiety    Arthritis    Atrial fibrillation (HCC)    a.) CHA2DS2-VASc = 4 (age, HTN, CVA x2). b.) s/p 200 J synchronized cardioversion (DCCV) on 12/08/2021. c.) rate/rythum maintained on oral metoprolol tartrate; chronically anticoagulated using rivaroxaban.   BPH (benign prostatic hyperplasia)    Complication of anesthesia    a.)  Intolerant of propofol; causes "skin burning and involuntary muscle twitching"   DDD (degenerative disc disease), lumbar    Decreased libido    Depression    Diverticulitis    Diverticulosis    GERD (gastroesophageal reflux disease)    Hepatic steatosis    History of 2019 novel coronavirus disease (COVID-19) 12/28/2020   History of cardiac murmur in childhood    History of kidney stones    HLD (hyperlipidemia)    HLD (hyperlipidemia)    Hypertension    Hypogonadism in male    Long term current use of anticoagulant    a.) rivaroxaban   OSA on CPAP    Pre-diabetes    Rhinitis, allergic    Stroke (HCC) 07/23/2019   a.) LEFT posterior cor radiata. b.) residual RIGHT sided weakness   Past Surgical History:  Procedure Laterality Date   CARDIOVERSION N/A 12/08/2021   Procedure: CARDIOVERSION;  Surgeon: Yvonne Kendall, MD;  Location: [facility];  Service: Cardiovascular;  Laterality: N/A;   COLONOSCOPY     EVALUATION UNDER ANESTHESIA WITH HEMORRHOIDECTOMY  N/A 04/15/2022   Procedure: EXAM UNDER ANESTHESIA WITH HEMORRHOIDECTOMY;  Surgeon: Sung Amabile, DO;  Location: [facility] OR[facility];  Service: General;  Laterality: N/A;   EXTERNAL EAR SURGERY     EYE SURGERY     FINGER SURGERY Right    lateration to 5 th digit   KNEE SURGERY     left eye     RECTAL EXAM UNDER ANESTHESIA N/A 04/23/2022   Procedure: RECTAL EXAM UNDER ANESTHESIA WITH LIGATION OF BLEEDING;  Surgeon: Henrene Dodge, MD;  Location: [facility] OR[facility];  Service: General;  Laterality: N/A;   Patient Active Problem List   Diagnosis Date Noted   Gastroesophageal reflux disease without esophagitis 05/17/2022   Dysthymia 05/17/2022   History of syncope 05/17/2022   History of hemorrhoidectomy 05/17/2022   Vitamin D deficiency 05/17/2022   Lower extremity edema 05/17/2022   History of rectal bleeding 05/17/2022   Bleeding internal hemorrhoids    Morbid obesity (HCC)    Persistent atrial fibrillation (HCC) 07/14/2020   Hypertension    Hemiparesis of right dominant side as late effect of cerebral infarction (HCC) 08/13/2019   History of kidney stones 05/02/2018   ED (erectile dysfunction) 05/02/2018   Hyperglycemia 01/30/2017   Arthritis of knee, degenerative 01/30/2017   Benign prostatic hyperplasia without lower urinary tract symptoms 02/03/2016   Dyslipidemia  08/04/2015   Diverticulitis of large intestine without perforation or abscess without bleeding 06/30/2015   OSA on CPAP 06/30/2015    REFERRING DIAG: M25.511,M25.512 (ICD-10-CM) - Bilateral shoulder pain, unspecified chronicity M17.2 (ICD-10-CM) - Post-traumatic osteoarthritis of both knees  R26.81 (ICD-10-CM) - Gait instability   THERAPY DIAG:  Muscle weakness (generalized)   Unsteadiness on feet   Abnormality of gait and mobility   Unspecified lack of coordination   Other abnormalities of gait and mobility   Difficulty in walking, not elsewhere classified  PERTINENT HISTORY: See evaluation  PRECAUTIONS: Fall  10/2:  :  38 feet x 16 laps plus 12 feet (620 feet).  HR: 65 bpm/ O2 sat. 96%.  2/19: : 68 feet x 6laps plus 10 ft. = (418)  Post VS: HR- 67 O2 Sat: 97  8/21: TUG: 21.3 sec. (No SPC), 15.8 sec. (with SPC).  8/21: DGI: 14/24  SUBJECTIVE:  09/19/2023    Pt reports continued feeling of "heaviness" in both legs; 2/10 pain in his R knee and 3/10 in his L knee. He says he goes to see his heart doctor tomorrow as well as his orthopedic doctor to discuss his knees since receiving the gel.  TODAY'S TREATMENT:              There Ex.:  Nustep L6 10 min. B UE/LE.  Seat position #10/ arm position #10.  0.53 miles.  Increase cadence/no increase c/o knee pain - HR 66 bpm, O2 94%, BP 131/49  post 10 minutes.  Standing Nautilus: 70# lat. Pull downs with added shoulder flexion holds 22x2, 70# Tricep pushdowns 2x12  TRX Squats: 2x22 - 8/10 on exertion scale after first set, pt reports increased knee fatigue towards end of each set  Standing 6-inch step taps, UE support as needed: 2x20 taps   Not today: Resisted Gait with Black TB: 3x each direction - 7/10 exertion scale; pt reported mild increase in R hip pain when side-stepping to the left  Walking High Knees in // bars: 3 laps with no UE support Seated B shoulder flexion/ abduction 20x.  Seated LAQ/marching/ heel raises.  Assessment of B lower leg/ ankle swelling and use of compression socks.   Walking in clinic/ hallway/ out to car with use of SPC and moderate cuing to correct upright posture/consistent step pattern.  Walking through grass/mulch/sidewalk and up/down curb.   TRX squats 22x2, Chair behind patient for cue on squat depth.   PATIENT EDUCATION: Education details: HEP/ daily activity Person educated: Patient Education method: Medical illustrator Education comprehension: verbalized understanding and returned demonstration   HOME EXERCISE PROGRAM: Handouts given for shoulder ROM/ strengthening/ standing hip ex.    PT  Short Term Goals -  08/29/23      PT SHORT TERM GOAL #1   Title Pt able to complete and ambulate >800 feet with no assistive device safely.     Baseline 10/2:  620 feet.  HR: 65 bpm/ O2 sat. 95%. 2/19: 418 feet. HR: 67 bpm/ O2 sat.97%   Time 4    Period Weeks    Status Partially met          PT Long Term Goals - 10/03/23      PT LONG TERM GOAL #1   Title Pt. Able to complete 10 steps with recip. Pattern and 1 handrail assist with mod. Independence safely.    Baseline Pt. Limited with R hip flexion/ recip. Gait pattern and requires heavy UE assist.  Pt. Is  considering getting a ramp access at home.    Time 8   Period Weeks    Status Ongoing   Target Date 10/03/2023     PT LONG TERM GOAL #2   Title Pt wil improve TUG score to < 12 sec with no AD to display significant imporvement in reduced risk of falls    Baseline 11/23: 26 sec (did not sit down immediately leading to higher time than he would've scored) 1/19: 14.44 sec.   3/7: 12.66 sec. 03/06/23: 11.41 seconds. 8/16: 21.3 sec. (No SPC), 15.8 sec. (with SPC)   Time 8   Period Weeks    Status Partially Met    Target Date 10/03/2023     PT LONG TERM GOAL #3   Title Pt will improve DGI to >19/24 to indicate decreased risk of falls with household and community walking tasks    Baseline 11/23: 11/24 1/19:12/24 8/16: 14/24   Time 8    Period Weeks    Status Partially Met    Target Date 10/03/2023     PT LONG TERM GOAL #4   Title Pt. Will increase R hip flexion/ LE muscle strength 1/2 muscle grade to improve standing tolerance/ walking endurance.    Baseline See above.  B hip flexion 4+/5 MMT (good control on R hip today).     Time 8    Period Weeks    Status Partially Met    Target Date 10/03/2023         Plan -     Clinical Impression Statement Pt reports to PT with continued feelings of "heaviness" in his LE's. Today's session consisted of exercises to improve pt's global strength and endurance. Pt continues to  require frequent seated therapeutic rest breaks throughout the session, which limits the interventions/exercises that can be performed each session. Pt educated again this session on the importance of keeping his exertion levels below at or below a 7/10 secondary to his HR staying relatively stable from his beta-blocker medication. Patient will continue to benefit from skilled PT services to progress strength and mobility, along with cardiovascular endurance.     Personal Factors and Comorbidities Comorbidity 2    Comorbidities Hypertension, Obesity    Examination-Activity Limitations Bed Mobility;Reach Overhead;Squat;Lift;Dressing;Hygiene/Grooming    Examination-Participation Restrictions Community Activity;Yard Work    Conservation officer, historic buildings Evolving/Moderate complexity    Clinical Decision Making Moderate    Rehab Potential Fair    PT Frequency 1x / week    PT Duration 12 weeks    PT Treatment/Interventions ADLs/Self Care Home Management;Gait training;Stair training;Functional mobility training;Therapeutic activities;Therapeutic exercise;Balance training;Neuromuscular re-education;Manual techniques;Patient/family education;Electrical Stimulation;Moist Heat;Cryotherapy    PT POC Discuss trip to [facility]/ activity level   PT Home Exercise Plan Progress endurance training to improve functional activity, follow-up on RPE scale (6-7/10), L knee/patellar mobilizations as necessary   Consulted and Agree with Plan of Care Patient          Terence Googe, SPT  Maylon Peppers, PT, DPT Physical Therapist - [facility] 09/19/23, 3:29 PM

## 2023-09-20 ENCOUNTER — Ambulatory Visit: Payer: Medicare Other | Attending: Cardiology | Admitting: Cardiology

## 2023-09-20 ENCOUNTER — Encounter: Payer: Self-pay | Admitting: Cardiology

## 2023-09-20 VITALS — BP 130/68 | HR 50 | Ht 71.0 in | Wt 310.6 lb

## 2023-09-20 DIAGNOSIS — Z01810 Encounter for preprocedural cardiovascular examination: Secondary | ICD-10-CM | POA: Insufficient documentation

## 2023-09-20 DIAGNOSIS — E7800 Pure hypercholesterolemia, unspecified: Secondary | ICD-10-CM | POA: Insufficient documentation

## 2023-09-20 DIAGNOSIS — I10 Essential (primary) hypertension: Secondary | ICD-10-CM | POA: Insufficient documentation

## 2023-09-20 DIAGNOSIS — I4819 Other persistent atrial fibrillation: Secondary | ICD-10-CM | POA: Diagnosis not present

## 2023-09-20 DIAGNOSIS — E785 Hyperlipidemia, unspecified: Secondary | ICD-10-CM | POA: Diagnosis not present

## 2023-09-20 MED ORDER — VALSARTAN 80 MG PO TABS: 80.0000 mg | ORAL_TABLET | Freq: Every day | ORAL | 0 refills | Status: DC

## 2023-09-20 MED ORDER — ROSUVASTATIN CALCIUM 20 MG PO TABS: 20.0000 mg | ORAL_TABLET | Freq: Every day | ORAL | 2 refills | Status: DC

## 2023-09-20 MED ORDER — METOPROLOL TARTRATE 25 MG PO TABS: 25.0000 mg | ORAL_TABLET | Freq: Two times a day (BID) | ORAL | 3 refills | Status: DC

## 2023-09-20 MED ORDER — FUROSEMIDE 20 MG PO TABS: 20.0000 mg | ORAL_TABLET | ORAL | 0 refills | Status: DC | PRN

## 2023-09-20 NOTE — Progress Notes (Signed)
Cardiology Office Note:    Date:  09/20/2023   ID:  Tom Underwood, DOB 03-Sep-1951, MRN [id number]  PCP:  Tom Cory, MD  [facility] Cardiologist:  Tom Odea, MD  [facility] Electrophysiologist:  Tom Prude, MD   Referring MD: Tom Cory, MD   Chief Complaint  Patient presents with   Follow-up    Patient reports stable dyspnea on exertion.  Needing medical clearance for colonoscopy on 10/06/2023.      History of Present Illness:    Tom Underwood is a 72 y.o. male with a hx of persistent A-fib (s/p DCCV 12/2021), hypertension, hyperlipidemia, CVA 07/2019 with right-sided weakness, OSA on CPAP presents for follow-up.  Colonoscopy being planned next month, cardiac risk stratification being sought before colonoscopy.  History of GI bleeds, s/p hemorrhoids resection.  Currently takes aspirin.  Patient is very scared to retry Xarelto.  Evaluated by EP regarding watchman consideration.  Patient would let EP team know if he decides on proceeding.  Otherwise doing okay.  No new concerns at this time.  Prior notes Echocardiogram 07/15/2020 normal systolic function, EF 55 to 60%.  Normal diastolic function. Previously evaluated by EP, not a candidate for ablation due to OSA, obesity. Hemorrhoids, GI bleeding, Xarelto stopped  Past Medical History:  Diagnosis Date   Anxiety    Arthritis    Atrial fibrillation (HCC)    a.) CHA2DS2-VASc = 4 (age, HTN, CVA x2). b.) s/p 200 J synchronized cardioversion (DCCV) on 12/08/2021. c.) rate/rythum maintained on oral metoprolol tartrate; chronically anticoagulated using rivaroxaban.   BPH (benign prostatic hyperplasia)    Complication of anesthesia    a.)  Intolerant of propofol; causes "skin burning and involuntary muscle twitching"   DDD (degenerative disc disease), lumbar    Decreased libido    Depression    Diverticulitis    Diverticulosis    GERD (gastroesophageal reflux disease)    Hepatic steatosis     History of 2019 novel coronavirus disease (COVID-19) 12/28/2020   History of cardiac murmur in childhood    History of kidney stones    HLD (hyperlipidemia)    HLD (hyperlipidemia)    Hypertension    Hypogonadism in male    Long term current use of anticoagulant    a.) rivaroxaban   OSA on CPAP    Pre-diabetes    Rhinitis, allergic    Stroke (HCC) 07/23/2019   a.) LEFT posterior cor radiata. b.) residual RIGHT sided weakness    Past Surgical History:  Procedure Laterality Date   CARDIOVERSION N/A 12/08/2021   Procedure: CARDIOVERSION;  Surgeon: Tom Kendall, MD;  Location: [facility];  Service: Cardiovascular;  Laterality: N/A;   COLONOSCOPY     EVALUATION UNDER ANESTHESIA WITH HEMORRHOIDECTOMY N/A 04/15/2022   Procedure: EXAM UNDER ANESTHESIA WITH HEMORRHOIDECTOMY;  Surgeon: Tom Amabile, DO;  Location: [facility] OR[facility];  Service: General;  Laterality: N/A;   EXTERNAL EAR SURGERY     EYE SURGERY     FINGER SURGERY Right    lateration to 5 th digit   KNEE SURGERY     left eye     RECTAL EXAM UNDER ANESTHESIA N/A 04/23/2022   Procedure: RECTAL EXAM UNDER ANESTHESIA WITH LIGATION OF BLEEDING;  Surgeon: Tom Dodge, MD;  Location: [facility] OR[facility];  Service: General;  Laterality: N/A;    Current Medications: Current Meds  Medication Sig   amitriptyline (ELAVIL) 25 MG tablet TAKE 1 TABLET BY MOUTH AT BEDTIME AS NEEDED FOR SLEEP   aspirin EC 81  MG tablet Take 1 tablet (81 mg total) by mouth daily. Swallow whole.   buPROPion (WELLBUTRIN XL) 150 MG 24 hr tablet Take 1 tablet (150 mg total) by mouth every morning.   busPIRone (BUSPAR) 10 MG tablet TAKE 1 TABLET BY MOUTH TWICE A DAY   Cholecalciferol (VITAMIN D) 125 MCG (5000 UT) CAPS Take 5,000 Units by mouth daily.   finasteride (PROSCAR) 5 MG tablet Take 1 tablet (5 mg total) by mouth daily.   fluticasone (FLONASE) 50 MCG/ACT nasal spray SPRAY 2 SPRAYS INTO EACH NOSTRIL EVERY DAY   GLUCOSAMINE-CHONDROITIN PO Take 2 tablets by mouth in the  morning and at bedtime.   lidocaine (XYLOCAINE) 5 % ointment Apply 1 application. topically 3 (three) times daily as needed. Apply pea size amount to area as needed   Multiple Vitamin (MULTIVITAMIN) tablet Take 1 tablet by mouth daily.   omega-3 acid ethyl esters (LOVAZA) 1 g capsule Take 2 capsules (2 g total) by mouth 2 (two) times daily.   pantoprazole (PROTONIX) 20 MG tablet Take 1 tablet (20 mg total) by mouth daily.   Saccharomyces boulardii (PROBIOTIC) 250 MG CAPS Take 1 capsule by mouth daily.   tamsulosin (FLOMAX) 0.4 MG CAPS capsule Take 1 capsule (0.4 mg total) by mouth daily.   VISINE DRY EYE RELIEF 1 % SOLN Place 1 drop into both eyes daily as needed (dry eyes).   [DISCONTINUED] furosemide (LASIX) 20 MG tablet Take 1 tablet (20 mg total) by mouth as needed. For lower extremity swelling/ PLEASE CALL OFFICE TO SCHEDULE APPOINTMENT PRIOR TO NEXT REFILL   [DISCONTINUED] metoprolol tartrate (LOPRESSOR) 25 MG tablet Take 1 tablet (25 mg total) by mouth 2 (two) times daily.   [DISCONTINUED] rosuvastatin (CRESTOR) 20 MG tablet Take 1 tablet (20 mg total) by mouth daily.   [DISCONTINUED] valsartan (DIOVAN) 80 MG tablet Take 1 tablet (80 mg total) by mouth daily.     Allergies:   Penicillins   Social History   Socioeconomic History   Marital status: Married    Spouse name: Tom Underwood    Number of children: 2   Years of education: Not on file   Highest education level: Not on file  Occupational History   Occupation: retired   Tobacco Use   Smoking status: Former    Types: Pipe    Start date: 02/20/1973    Quit date: 02/20/1974    Years since quitting: 49.6   Smokeless tobacco: Never  Vaping Use   Vaping status: Never Used  Substance and Sexual Activity   Alcohol use: No    Alcohol/week: 0.0 standard drinks of alcohol   Drug use: No   Sexual activity: Yes    Partners: Female  Other Topics Concern   Not on file  Social History Narrative   Not on file   Social Determinants of  Health   Financial Resource Strain: Low Risk  (05/26/2023)   Overall Financial Resource Strain (CARDIA)    Difficulty of Paying Living Expenses: Not very hard  Food Insecurity: No Food Insecurity (05/26/2023)   Hunger Vital Sign    Worried About Running Out of Food in the Last Year: Never true    Ran Out of Food in the Last Year: Never true  Transportation Needs: No Transportation Needs (05/26/2023)   PRAPARE - Administrator, Civil Service (Medical): No    Lack of Transportation (Non-Medical): No  Physical Activity: Insufficiently Active (05/26/2023)   Exercise Vital Sign    Days of Exercise  per Week: 3 days    Minutes of Exercise per Session: 30 min  Stress: No Stress Concern Present (05/26/2023)   [employer] of Occupational Health - Occupational Stress Questionnaire    Feeling of Stress : Not at all  Social Connections: Socially Integrated (05/26/2023)   Social Connection and Isolation Panel [NHANES]    Frequency of Communication with Friends and Family: More than three times a week    Frequency of Social Gatherings with Friends and Family: Twice a week    Attends Religious Services: More than 4 times per year    Active Member of [employer] or Organizations: Yes    Attends Engineer, structural: More than 4 times per year    Marital Status: Married     Family History: The patient's family history includes Asthma in his father and mother; Dementia in his mother; Heart attack in his mother; Heart disease in his father and mother; Stroke in his father.  ROS:   Please see the history of present illness.     All other systems reviewed and are negative.  EKGs/Labs/Other Studies Reviewed:    The following studies were reviewed today:   EKG Interpretation Date/Time:  Wednesday September 20 2023 11:15:50 EDT Ventricular Rate:  51 PR Interval:  178 QRS Duration:  94 QT Interval:  412 QTC Calculation: 379 R Axis:   34  Text Interpretation: Sinus bradycardia  Confirmed by Tom Underwood ([id number]) on 09/20/2023 11:25:20 AM    Recent Labs: 06/30/2023: ALT 23; BUN 12; Creatinine, Ser 1.08; Hemoglobin 16.0; Platelets 205; Potassium 3.8; Sodium 142  Recent Lipid Panel    Component Value Date/Time   CHOL 120 06/30/2023 1138   TRIG 96 06/30/2023 1138   HDL 51 06/30/2023 1138   CHOLHDL 2.4 06/30/2023 1138   LDLCALC 51 06/30/2023 1138     Risk Assessment/Calculations:      Physical Exam:    VS:  BP 130/68 (BP Location: Right Arm, Patient Position: Sitting, Cuff Size: Large)   Pulse (!) 50   Ht 5\' 11"  (1.803 m)   Wt (!) 310 lb 9.6 oz (140.9 kg)   SpO2 95%   BMI 43.32 kg/m     Wt Readings from Last 3 Encounters:  09/20/23 (!) 310 lb 9.6 oz (140.9 kg)  07/31/23 (!) 311 lb (141.1 kg)  05/26/23 (!) 307 lb (139.3 kg)     GEN:  Well nourished, well developed in no acute distress HEENT: Normal NECK: No JVD; No carotid bruits CARDIAC: Regular rate and rhythm, no murmurs RESPIRATORY:  Clear to auscultation without rales, wheezing or rhonchi  ABDOMEN: Soft, non-tender, non-distended MUSCULOSKELETAL: Trace edema SKIN: Warm and dry NEUROLOGIC:  Alert and oriented x 3 PSYCHIATRIC:  Normal affect   ASSESSMENT:    1. Pre-procedural cardiovascular examination   2. Persistent atrial fibrillation (HCC)   3. Primary hypertension   4. Pure hypercholesterolemia   5. Dyslipidemia    PLAN:    In order of problems listed above:  Preprocedural exam, colonoscopy being planned.  GI procedures deemed low risk.  Okay for colonoscopy from a cardiac perspective.  Last EF normal at 60 to 65%. Atrial fibrillation, persistent, s/p DCCV 12/2021.  Maintaining sinus rhythm.  CHA2DS2-VASc score 4 (Age, htn, CVA).  Continue Lopressor 25 mg twice daily, aspirin 81 mg daily.  History of bleeding issues with Xarelto, hemorrhoids s/p surgical repair.  No further bleeding noted being off NOAC.  Being considered for watchman when patient decides. hypertension, BP  controlled.  Continue Lopressor 25 mg twice daily, valsartan 80. hyperlipidemia, cholesterol controlled, continue Crestor 20 mg daily, lovaza.  Follow-up in 12 months.     Medication Adjustments/Labs and Tests Ordered: Current medicines are reviewed at length with the patient today.  Concerns regarding medicines are outlined above.  Orders Placed This Encounter  Procedures   EKG 12-Lead    Meds ordered this encounter  Medications   furosemide (LASIX) 20 MG tablet    Sig: Take 1 tablet (20 mg total) by mouth as needed.    Dispense:  90 tablet    Refill:  0   rosuvastatin (CRESTOR) 20 MG tablet    Sig: Take 1 tablet (20 mg total) by mouth daily.    Dispense:  90 tablet    Refill:  2   metoprolol tartrate (LOPRESSOR) 25 MG tablet    Sig: Take 1 tablet (25 mg total) by mouth 2 (two) times daily.    Dispense:  180 tablet    Refill:  3   valsartan (DIOVAN) 80 MG tablet    Sig: Take 1 tablet (80 mg total) by mouth daily.    Dispense:  90 tablet    Refill:  0     Patient Instructions  Medication Instructions:   Your physician recommends that you continue on your current medications as directed. Please refer to the Current Medication list given to you today.  *If you need a refill on your cardiac medications before your next appointment, please call your pharmacy*   Lab Work:  None Ordered  If you have labs (blood work) drawn today and your tests are completely normal, you will receive your results only by: MyChart Message (if you have MyChart) OR A paper copy in the mail If you have any lab test that is abnormal or we need to change your treatment, we will call you to review the results.   Testing/Procedures:  None Ordered   Follow-Up: At [facility], you and your health needs are our priority.  As part of our continuing mission to provide you with exceptional heart care, we have created designated Provider Care Teams.  These Care Teams include your  primary Cardiologist (physician) and Advanced Practice Providers (APPs -  Physician Assistants and Nurse Practitioners) who all work together to provide you with the care you need, when you need it.  We recommend signing up for the patient portal called "MyChart".  Sign up information is provided on this After Visit Summary.  MyChart is used to connect with patients for Virtual Visits (Telemedicine).  Patients are able to view lab/test results, encounter notes, upcoming appointments, etc.  Non-urgent messages can be sent to your provider as well.   To learn more about what you can do with MyChart, go to [email or web address].    Your next appointment:   12 month(s)  Provider:   You may see Tom Odea, MD or one of the following Advanced Practice Providers on your designated Care Team:   Nicolasa Ducking, NP Eula Listen, PA-C Cadence Fransico [name], PA-C Charlsie Quest, NP    Signed, Tom Odea, MD  09/20/2023 11:48 AM    [facility]

## 2023-09-20 NOTE — Patient Instructions (Signed)
Medication Instructions:   Your physician recommends that you continue on your current medications as directed. Please refer to the Current Medication list given to you today.  *If you need a refill on your cardiac medications before your next appointment, please call your pharmacy*   Lab Work:  None Ordered  If you have labs (blood work) drawn today and your tests are completely normal, you will receive your results only by: MyChart Message (if you have MyChart) OR A paper copy in the mail If you have any lab test that is abnormal or we need to change your treatment, we will call you to review the results.   Testing/Procedures:  None Ordered    Follow-Up: At [facility], you and your health needs are our priority.  As part of our continuing mission to provide you with exceptional heart care, we have created designated Provider Care Teams.  These Care Teams include your primary Cardiologist (physician) and Advanced Practice Providers (APPs -  Physician Assistants and Nurse Practitioners) who all work together to provide you with the care you need, when you need it.  We recommend signing up for the patient portal called "MyChart".  Sign up information is provided on this After Visit Summary.  MyChart is used to connect with patients for Virtual Visits (Telemedicine).  Patients are able to view lab/test results, encounter notes, upcoming appointments, etc.  Non-urgent messages can be sent to your provider as well.   To learn more about what you can do with MyChart, go to [email or web address].    Your next appointment:   12 month(s)  Provider:   You may see Brian Agbor-Etang, MD or one of the following Advanced Practice Providers on your designated Care Team:   Christopher Berge, NP Ryan Dunn, PA-C Cadence Furth, PA-C Sheri Hammock, NP  

## 2023-09-26 DIAGNOSIS — R600 Localized edema: Secondary | ICD-10-CM | POA: Diagnosis not present

## 2023-09-26 NOTE — Telephone Encounter (Signed)
Preprocedural exam, colonoscopy being planned.  GI procedures deemed low risk.  Okay for colonoscopy from a cardiac perspective.  Last EF normal at 60 to 65%.    Signed, Debbe Odea, MD  09/20/2023 11:48 AM     [facility]

## 2023-09-27 ENCOUNTER — Ambulatory Visit: Payer: Medicare Other | Admitting: Physical Therapy

## 2023-09-27 ENCOUNTER — Encounter: Payer: Self-pay | Admitting: Physical Therapy

## 2023-09-27 DIAGNOSIS — R2681 Unsteadiness on feet: Secondary | ICD-10-CM

## 2023-09-27 DIAGNOSIS — R279 Unspecified lack of coordination: Secondary | ICD-10-CM | POA: Diagnosis not present

## 2023-09-27 DIAGNOSIS — R262 Difficulty in walking, not elsewhere classified: Secondary | ICD-10-CM

## 2023-09-27 DIAGNOSIS — R269 Unspecified abnormalities of gait and mobility: Secondary | ICD-10-CM

## 2023-09-27 DIAGNOSIS — M6281 Muscle weakness (generalized): Secondary | ICD-10-CM

## 2023-09-27 NOTE — Therapy (Unsigned)
OUTPATIENT PHYSICAL THERAPY TREATMENT  Patient Name: Tom Underwood MRN: [id number] DOB:1951-10-17, 72 y.o., male Today's Date: 09/27/2023  PCP: Alba Cory, MD REFERRING PROVIDER: Caro Laroche, DO    PT End of Session - 09/27/23 1250     Visit Number 59    Number of Visits 63    Date for PT Re-Evaluation 10/03/23    PT Start Time 1252    PT Stop Time 1350    PT Time Calculation (min) 58 min    Activity Tolerance Patient tolerated treatment well    Behavior During Therapy WFL for tasks assessed/performed                    Past Medical History:  Diagnosis Date   Anxiety    Arthritis    Atrial fibrillation (HCC)    a.) CHA2DS2-VASc = 4 (age, HTN, CVA x2). b.) s/p 200 J synchronized cardioversion (DCCV) on 12/08/2021. c.) rate/rythum maintained on oral metoprolol tartrate; chronically anticoagulated using rivaroxaban.   BPH (benign prostatic hyperplasia)    Complication of anesthesia    a.)  Intolerant of propofol; causes "skin burning and involuntary muscle twitching"   DDD (degenerative disc disease), lumbar    Decreased libido    Depression    Diverticulitis    Diverticulosis    GERD (gastroesophageal reflux disease)    Hepatic steatosis    History of 2019 novel coronavirus disease (COVID-19) 12/28/2020   History of cardiac murmur in childhood    History of kidney stones    HLD (hyperlipidemia)    HLD (hyperlipidemia)    Hypertension    Hypogonadism in male    Long term current use of anticoagulant    a.) rivaroxaban   OSA on CPAP    Pre-diabetes    Rhinitis, allergic    Stroke (HCC) 07/23/2019   a.) LEFT posterior cor radiata. b.) residual RIGHT sided weakness   Past Surgical History:  Procedure Laterality Date   CARDIOVERSION N/A 12/08/2021   Procedure: CARDIOVERSION;  Surgeon: Yvonne Kendall, MD;  Location: [facility];  Service: Cardiovascular;  Laterality: N/A;   COLONOSCOPY     EVALUATION UNDER ANESTHESIA WITH HEMORRHOIDECTOMY  N/A 04/15/2022   Procedure: EXAM UNDER ANESTHESIA WITH HEMORRHOIDECTOMY;  Surgeon: Sung Amabile, DO;  Location: [facility];  Service: General;  Laterality: N/A;   EXTERNAL EAR SURGERY     EYE SURGERY     FINGER SURGERY Right    lateration to 5 th digit   KNEE SURGERY     left eye     RECTAL EXAM UNDER ANESTHESIA N/A 04/23/2022   Procedure: RECTAL EXAM UNDER ANESTHESIA WITH LIGATION OF BLEEDING;  Surgeon: Henrene Dodge, MD;  Location: [facility];  Service: General;  Laterality: N/A;   Patient Active Problem List   Diagnosis Date Noted   Gastroesophageal reflux disease without esophagitis 05/17/2022   Dysthymia 05/17/2022   History of syncope 05/17/2022   History of hemorrhoidectomy 05/17/2022   Vitamin D deficiency 05/17/2022   Lower extremity edema 05/17/2022   History of rectal bleeding 05/17/2022   Bleeding internal hemorrhoids    Morbid obesity (HCC)    Persistent atrial fibrillation (HCC) 07/14/2020   Hypertension    Hemiparesis of right dominant side as late effect of cerebral infarction (HCC) 08/13/2019   History of kidney stones 05/02/2018   ED (erectile dysfunction) 05/02/2018   Hyperglycemia 01/30/2017   Arthritis of knee, degenerative 01/30/2017   Benign prostatic hyperplasia without lower urinary tract symptoms 02/03/2016  Dyslipidemia 08/04/2015   Diverticulitis of large intestine without perforation or abscess without bleeding 06/30/2015   OSA on CPAP 06/30/2015    REFERRING DIAG: M25.511,M25.512 (ICD-10-CM) - Bilateral shoulder pain, unspecified chronicity M17.2 (ICD-10-CM) - Post-traumatic osteoarthritis of both knees  R26.81 (ICD-10-CM) - Gait instability   THERAPY DIAG:  Muscle weakness (generalized)   Unsteadiness on feet   Abnormality of gait and mobility   Unspecified lack of coordination   Other abnormalities of gait and mobility   Difficulty in walking, not elsewhere classified  PERTINENT HISTORY: See evaluation  PRECAUTIONS: Fall  10/2:  :  38 feet x 16 laps plus 12 feet (620 feet).  HR: 65 bpm/ O2 sat. 96%.  2/19: : 68 feet x 6laps plus 10 ft. = (418)  Post VS: HR- 67 O2 Sat: 97  8/21: TUG: 21.3 sec. (No SPC), 15.8 sec. (with SPC).  8/21: DGI: 14/24  SUBJECTIVE:  09/27/2023    Pt reports continued feeling of "heaviness" in both legs; 2/10 pain in both knees. He says he saw his doctor yesterday and they referred him to [facility] to determine next steps for his leg swelling.  TODAY'S TREATMENT:              There Ex.:             Sitting BP at start of session: 112/52  Nustep L6 10 min. B UE/LE.  Seat position #10/ arm position #10.  0.52  miles.  Increase cadence/no increase c/o knee pain   High knees/backwards walking/ sidesteps in // bars: 3 laps each direction - pt reports bilateral LE fatigue (L > R)   Standing Nautilus: 70# lat. Pull downs with added shoulder flexion holds 22x2, 70# Tricep pushdowns 2x6  TRX Squats: 2x22 - 8/10 on exertion scale after first set, pt reports increased knee fatigue towards end of each set  Walking through grass/mulch with SPC to challenge dynamic balance - pt did well but had slower and more cautious gait speed   Not today: Standing 6-inch step taps, UE support as needed: 2x20 taps Resisted Gait with Black TB: 3x each direction - 7/10 exertion scale; pt reported mild increase in R hip pain when side-stepping to the left  Walking High Knees in // bars: 3 laps with no UE support Seated B shoulder flexion/ abduction 20x.  Seated LAQ/marching/ heel raises.  Assessment of B lower leg/ ankle swelling and use of compression socks.   Walking in clinic/ hallway/ out to car with use of SPC and moderate cuing to correct upright posture/consistent step pattern.  Walking through grass/mulch/sidewalk and up/down curb.   TRX squats 22x2, Chair behind patient for cue on squat depth.   PATIENT EDUCATION: Education details: HEP/ daily activity Person educated: Patient Education  method: Medical illustrator Education comprehension: verbalized understanding and returned demonstration   HOME EXERCISE PROGRAM: Handouts given for shoulder ROM/ strengthening/ standing hip ex.    PT Short Term Goals -  08/29/23      PT SHORT TERM GOAL #1   Title Pt able to complete and ambulate >800 feet with no assistive device safely.     Baseline 10/2:  620 feet.  HR: 65 bpm/ O2 sat. 95%. 2/19: 418 feet. HR: 67 bpm/ O2 sat.97%   Time 4    Period Weeks    Status Partially met          PT Long Term Goals - 10/03/23      PT LONG  TERM GOAL #1   Title Pt. Able to complete 10 steps with recip. Pattern and 1 handrail assist with mod. Independence safely.    Baseline Pt. Limited with R hip flexion/ recip. Gait pattern and requires heavy UE assist.  Pt. Is considering getting a ramp access at home.    Time 8   Period Weeks    Status Ongoing   Target Date 10/03/2023     PT LONG TERM GOAL #2   Title Pt wil improve TUG score to < 12 sec with no AD to display significant imporvement in reduced risk of falls    Baseline 11/23: 26 sec (did not sit down immediately leading to higher time than he would've scored) 1/19: 14.44 sec.   3/7: 12.66 sec. 03/06/23: 11.41 seconds. 8/16: 21.3 sec. (No SPC), 15.8 sec. (with SPC)   Time 8   Period Weeks    Status Partially Met    Target Date 10/03/2023     PT LONG TERM GOAL #3   Title Pt will improve DGI to >19/24 to indicate decreased risk of falls with household and community walking tasks    Baseline 11/23: 11/24 1/19:12/24 8/16: 14/24   Time 8    Period Weeks    Status Partially Met    Target Date 10/03/2023     PT LONG TERM GOAL #4   Title Pt. Will increase R hip flexion/ LE muscle strength 1/2 muscle grade to improve standing tolerance/ walking endurance.    Baseline See above.  B hip flexion 4+/5 MMT (good control on R hip today).     Time 8    Period Weeks    Status Partially Met    Target Date 10/03/2023          Plan -     Clinical Impression Statement Pt reports to PT with continued feelings of "heaviness" in his LE's. Exercises today focused on improving pt's upper and lower extremity strength and endurance. All exercises performed in standing to improve pt's standing tolerance. Pt continues to experience increased LE fatigue with prolonged standing/walking, particularly in his L LE. Pt continues to be cued throughout the session to keep his exertion levels at or below a 7-8/10. Session ended with Nustep to improve pt's cardiovascular endurance.  Patient will continue to benefit from skilled PT services to progress strength and mobility, along with cardiovascular endurance.     Personal Factors and Comorbidities Comorbidity 2    Comorbidities Hypertension, Obesity    Examination-Activity Limitations Bed Mobility;Reach Overhead;Squat;Lift;Dressing;Hygiene/Grooming    Examination-Participation Restrictions Community Activity;Yard Work    Conservation officer, historic buildings Evolving/Moderate complexity    Clinical Decision Making Moderate    Rehab Potential Fair    PT Frequency 1x / week    PT Duration 12 weeks    PT Treatment/Interventions ADLs/Self Care Home Management;Gait training;Stair training;Functional mobility training;Therapeutic activities;Therapeutic exercise;Balance training;Neuromuscular re-education;Manual techniques;Patient/family education;Electrical Stimulation;Moist Heat;Cryotherapy    PT POC Discuss activity level   PT Home Exercise Plan Progress endurance training to improve functional activity, follow-up on RPE scale (6-7/10), L knee/patellar mobilizations as necessary   Consulted and Agree with Plan of Care Patient          Cena Benton, SPT 09/27/23, 2:02 PM

## 2023-09-29 ENCOUNTER — Encounter: Payer: Self-pay | Admitting: Gastroenterology

## 2023-10-04 ENCOUNTER — Ambulatory Visit: Payer: Medicare Other

## 2023-10-04 ENCOUNTER — Encounter: Payer: Medicare Other | Admitting: Physical Therapy

## 2023-10-04 ENCOUNTER — Encounter: Payer: Self-pay | Admitting: Physical Therapy

## 2023-10-04 DIAGNOSIS — R279 Unspecified lack of coordination: Secondary | ICD-10-CM

## 2023-10-04 DIAGNOSIS — R262 Difficulty in walking, not elsewhere classified: Secondary | ICD-10-CM | POA: Diagnosis not present

## 2023-10-04 DIAGNOSIS — M6281 Muscle weakness (generalized): Secondary | ICD-10-CM

## 2023-10-04 DIAGNOSIS — R269 Unspecified abnormalities of gait and mobility: Secondary | ICD-10-CM | POA: Diagnosis not present

## 2023-10-04 DIAGNOSIS — R2681 Unsteadiness on feet: Secondary | ICD-10-CM | POA: Diagnosis not present

## 2023-10-04 NOTE — Therapy (Signed)
OUTPATIENT PHYSICAL THERAPY PROGRESS NOTE/RECERTIFICATION             07/11/2023 - 10/04/2023 Patient Name: Tom Underwood MRN: [id number] DOB:[birth year], 72 y.o., male Today's Date: 10/04/2023  PCP: Alba Cory, MD REFERRING PROVIDER: Caro Laroche, DO    PT End of Session - 10/04/23 1113     Visit Number 60    Number of Visits 67    Date for PT Re-Evaluation 11/01/23    PT Start Time 1115    PT Stop Time 1210    PT Time Calculation (min) 55 min    Equipment Utilized During Treatment Gait belt    Activity Tolerance Patient limited by fatigue    Behavior During Therapy WFL for tasks assessed/performed               Past Medical History:  Diagnosis Date   Anxiety    Arthritis    Atrial fibrillation (HCC)    a.) CHA2DS2-VASc = 4 (age, HTN, CVA x2). b.) s/p 200 J synchronized cardioversion (DCCV) on 12/08/2021. c.) rate/rythum maintained on oral metoprolol tartrate; chronically anticoagulated using rivaroxaban.   BPH (benign prostatic hyperplasia)    Complication of anesthesia    a.)  Intolerant of propofol; causes "skin burning and involuntary muscle twitching"   DDD (degenerative disc disease), lumbar    Decreased libido    Depression    Diverticulitis    Diverticulosis    GERD (gastroesophageal reflux disease)    Hepatic steatosis    History of 2019 novel coronavirus disease (COVID-19) 12/28/2020   History of cardiac murmur in childhood    History of kidney stones    HLD (hyperlipidemia)    Hypertension    Hypogonadism in male    OSA on CPAP    Pre-diabetes    Rhinitis, allergic    Stroke (HCC) 07/23/2019   a.) LEFT posterior cor radiata. b.) residual RIGHT sided weakness   Past Surgical History:  Procedure Laterality Date   CARDIOVERSION N/A 12/08/2021   Procedure: CARDIOVERSION;  Surgeon: Yvonne Kendall, MD;  Location: [facility];  Service: Cardiovascular;  Laterality: N/A;   COLONOSCOPY     EVALUATION UNDER ANESTHESIA WITH  HEMORRHOIDECTOMY N/A 04/15/2022   Procedure: EXAM UNDER ANESTHESIA WITH HEMORRHOIDECTOMY;  Surgeon: Sung Amabile, DO;  Location: [facility];  Service: General;  Laterality: N/A;   EXTERNAL EAR SURGERY     EYE SURGERY     FINGER SURGERY Right    lateration to 5 th digit   KNEE SURGERY     left eye     RECTAL EXAM UNDER ANESTHESIA N/A 04/23/2022   Procedure: RECTAL EXAM UNDER ANESTHESIA WITH LIGATION OF BLEEDING;  Surgeon: Henrene Dodge, MD;  Location: [facility];  Service: General;  Laterality: N/A;   Patient Active Problem List   Diagnosis Date Noted   Gastroesophageal reflux disease without esophagitis 05/17/2022   Dysthymia 05/17/2022   History of syncope 05/17/2022   History of hemorrhoidectomy 05/17/2022   Vitamin D deficiency 05/17/2022   Lower extremity edema 05/17/2022   History of rectal bleeding 05/17/2022   Bleeding internal hemorrhoids    Morbid obesity (HCC)    Persistent atrial fibrillation (HCC) 07/14/2020   Hypertension    Hemiparesis of right dominant side as late effect of cerebral infarction (HCC) 08/13/2019   History of kidney stones 05/02/2018   ED (erectile dysfunction) 05/02/2018   Hyperglycemia 01/30/2017   Arthritis of knee, degenerative 01/30/2017   Benign prostatic hyperplasia without lower urinary tract symptoms 02/03/2016  Dyslipidemia 08/04/2015   Diverticulitis of large intestine without perforation or abscess without bleeding 06/30/2015   OSA on CPAP 06/30/2015    REFERRING DIAG: M25.511,M25.512 (ICD-10-CM) - Bilateral shoulder pain, unspecified chronicity M17.2 (ICD-10-CM) - Post-traumatic osteoarthritis of both knees  R26.81 (ICD-10-CM) - Gait instability   THERAPY DIAG:  Muscle weakness (generalized)   Unsteadiness on feet   Abnormality of gait and mobility   Unspecified lack of coordination   Other abnormalities of gait and mobility   Difficulty in walking, not elsewhere classified  PERTINENT HISTORY: See evaluation  PRECAUTIONS:  Fall  10/2:  : 38 feet x 16 laps plus 12 feet (620 feet).  HR: 65 bpm/ O2 sat. 96%.  2/19: : 68 feet x 6laps plus 10 ft. = (418)  Post VS: HR- 67 O2 Sat: 97  8/21: TUG: 21.3 sec. (No SPC), 15.8 sec. (with SPC).  8/21: DGI: 14/24  SUBJECTIVE:  10/04/2023    Pt reports feeling like the heaviness in his legs has increased since last week. He says he still has not gotten a call to schedule his appointment with vascular.   TODAY'S TREATMENT:              There Act.:             Sitting BP at start of session: 127/47, 94% O2, 47 bpm  Nustep L6 10 min. B UE/LE.  Seat position #10/ arm position #10.  0.52  miles.  Increase cadence/no increase c/o knee pain   : 460 feet; vitals post : 96% O2, 58 bpm, 132/49 sitting BP  TUG: 22.05 seconds with SPC  DGI: 15/24  LOWER EXTREMITY MMT:    MMT Right eval Left eval  Hip flexion 4 5  Hip extension    Hip abduction 4 4  Hip adduction 4 4  Hip internal rotation    Hip external rotation    Knee flexion 5 5   Knee extension 5 5  Ankle dorsiflexion 5 5  Ankle plantarflexion    Ankle inversion    Ankle eversion     (Blank rows = not tested)      Not today: High knees/backwards walking/ sidesteps in // bars: 3 laps each direction - pt reports bilateral LE fatigue (L > R) Standing Nautilus: 70# lat. Pull downs with added shoulder flexion holds 22x2, 70# Tricep pushdowns 2x6 TRX Squats: 2x22 - 8/10 on exertion scale after first set, pt reports increased knee fatigue towards end of each set Standing 6-inch step taps, UE support as needed: 2x20 taps Resisted Gait with Black TB: 3x each direction - 7/10 exertion scale; pt reported mild increase in R hip pain when side-stepping to the left  Walking High Knees in // bars: 3 laps with no UE support Seated B shoulder flexion/ abduction 20x.  Seated LAQ/marching/ heel raises.  Assessment of B lower leg/ ankle swelling and use of compression socks.   Walking in clinic/  hallway/ out to car with use of SPC and moderate cuing to correct upright posture/consistent step pattern.  Walking through grass/mulch/sidewalk and up/down curb.   TRX squats 22x2, Chair behind patient for cue on squat depth.   PATIENT EDUCATION: Education details: HEP/ daily activity Person educated: Patient Education method: Medical illustrator Education comprehension: verbalized understanding and returned demonstration   HOME EXERCISE PROGRAM: Handouts given for shoulder ROM/ strengthening/ standing hip ex.    PT Short Term Goals -  08/29/23      PT SHORT TERM  GOAL #1   Title Pt able to complete and ambulate >800 feet with no assistive device safely.     Baseline 10/30: 460 feet. HR 58 bpm/ O2 sat 96% 10/2:  620 feet.  HR: 65 bpm/ O2 sat. 95%. 2/19: 418 feet. HR: 67 bpm/ O2 sat.97%   Time 4    Period Weeks    Status Partially met          PT Long Term Goals - 10/03/23      PT LONG TERM GOAL #1   Title Pt. Able to complete 10 steps with recip. Pattern and 1 handrail assist with mod. Independence safely.    Baseline Pt. Limited with R hip flexion/ recip. Gait pattern and requires heavy UE assist.  Pt. Is considering getting a ramp access at home.    Time 8   Period Weeks    Status Ongoing   Target Date 11/01/2023     PT LONG TERM GOAL #2   Title Pt wil improve TUG score to < 12 sec with no AD to display significant imporvement in reduced risk of falls    Baseline 11/23: 26 sec (did not sit down immediately leading to higher time than he would've scored) 1/19: 14.44 sec.   3/7: 12.66 sec. 03/06/23: 11.41 seconds. 8/16: 21.3 sec. (No SPC), 15.8 sec. (with SPC); 10/30: 22.05 seconds with SPC   Time 8   Period Weeks    Status Partially Met    Target Date 11/01/2023     PT LONG TERM GOAL #3   Title Pt will improve DGI to >19/24 to indicate decreased risk of falls with household and community walking tasks    Baseline 11/23: 11/24 1/19:12/24 8/16: 14/24,  10/30: 15/24   Time 8    Period Weeks    Status Partially Met    Target Date 12/01/2023     PT LONG TERM GOAL #4   Title Pt. Will increase R hip flexion/ LE muscle strength 1/2 muscle grade to improve standing tolerance/ walking endurance.    Baseline See above.  B hip flexion 4+/5 MMT (good control on R hip today), 10/30: see for updated scores   Time 8    Period Weeks    Status Partially Met    Target Date 11/01/2023         Plan -     Clinical Impression Statement Pt reports to PT with continued feelings of "heaviness" in his LE's. Goal reassessment performed today. Pt continues to demonstrate limitations in his LE and overall cardiovascular endurance, as indicated by his distance. Pt is also at increased risk for falls based on his TUG time. Pt demonstrated improvements in LE MMT scores compared to last reassessment. Pt educated on importance of home carryover and consistency with HEP. Pt also educated on possibility of joining a gym at the end of his plan of care to promote continued improvement in LE strength and cardiovascular endurance. Patient will continue to benefit from skilled PT services to progress strength and mobility, along with cardiovascular endurance.     Personal Factors and Comorbidities Comorbidity 2    Comorbidities Hypertension, Obesity    Examination-Activity Limitations Bed Mobility;Reach Overhead;Squat;Lift;Dressing;Hygiene/Grooming    Examination-Participation Restrictions Community Activity;Yard Work    Conservation officer, historic buildings Evolving/Moderate complexity    Clinical Decision Making Moderate    Rehab Potential Fair    PT Frequency 1x / week    PT Duration 12 weeks    PT Treatment/Interventions ADLs/Self Care  Home Management;Gait training;Stair training;Functional mobility training;Therapeutic activities;Therapeutic exercise;Balance training;Neuromuscular re-education;Manual techniques;Patient/family education;Electrical Stimulation;Moist  Heat;Cryotherapy    PT POC Discuss activity level/  RECERTIFICATION   PT Home Exercise Plan Progress endurance training to improve functional activity, follow-up on RPE scale (6-7/10), L knee/patellar mobilizations as necessary   Consulted and Agree with Plan of Care Patient         Mckell Riecke, SPT  Maylon Peppers, PT, DPT Physical Therapist - [facility]  10/04/23, 12:15 PM

## 2023-10-06 ENCOUNTER — Encounter
Admission: RE | Disposition: A | Discharge status: Home / Self Care | Payer: Self-pay | Source: Home / Self Care | Attending: Gastroenterology

## 2023-10-06 ENCOUNTER — Ambulatory Visit: Payer: Medicare Other | Admitting: Anesthesiology

## 2023-10-06 ENCOUNTER — Ambulatory Visit
Admission: RE | Admit: 2023-10-06 | Discharge: 2023-10-06 | Disposition: A | Discharge status: Home / Self Care | Payer: Medicare Other | Attending: Gastroenterology | Admitting: Gastroenterology

## 2023-10-06 ENCOUNTER — Encounter: Payer: Self-pay | Admitting: Gastroenterology

## 2023-10-06 DIAGNOSIS — E785 Hyperlipidemia, unspecified: Secondary | ICD-10-CM | POA: Diagnosis not present

## 2023-10-06 DIAGNOSIS — K635 Polyp of colon: Secondary | ICD-10-CM | POA: Diagnosis not present

## 2023-10-06 DIAGNOSIS — K219 Gastro-esophageal reflux disease without esophagitis: Secondary | ICD-10-CM | POA: Insufficient documentation

## 2023-10-06 DIAGNOSIS — D126 Benign neoplasm of colon, unspecified: Secondary | ICD-10-CM

## 2023-10-06 DIAGNOSIS — F17290 Nicotine dependence, other tobacco product, uncomplicated: Secondary | ICD-10-CM | POA: Diagnosis not present

## 2023-10-06 DIAGNOSIS — F32A Depression, unspecified: Secondary | ICD-10-CM | POA: Insufficient documentation

## 2023-10-06 DIAGNOSIS — F419 Anxiety disorder, unspecified: Secondary | ICD-10-CM | POA: Insufficient documentation

## 2023-10-06 DIAGNOSIS — Z79899 Other long term (current) drug therapy: Secondary | ICD-10-CM | POA: Diagnosis not present

## 2023-10-06 DIAGNOSIS — Z1211 Encounter for screening for malignant neoplasm of colon: Secondary | ICD-10-CM | POA: Diagnosis not present

## 2023-10-06 DIAGNOSIS — K5730 Diverticulosis of large intestine without perforation or abscess without bleeding: Secondary | ICD-10-CM | POA: Insufficient documentation

## 2023-10-06 DIAGNOSIS — D124 Benign neoplasm of descending colon: Secondary | ICD-10-CM | POA: Insufficient documentation

## 2023-10-06 DIAGNOSIS — I4891 Unspecified atrial fibrillation: Secondary | ICD-10-CM | POA: Insufficient documentation

## 2023-10-06 DIAGNOSIS — Z8673 Personal history of transient ischemic attack (TIA), and cerebral infarction without residual deficits: Secondary | ICD-10-CM | POA: Insufficient documentation

## 2023-10-06 DIAGNOSIS — Z87891 Personal history of nicotine dependence: Secondary | ICD-10-CM | POA: Diagnosis not present

## 2023-10-06 DIAGNOSIS — G4733 Obstructive sleep apnea (adult) (pediatric): Secondary | ICD-10-CM | POA: Diagnosis not present

## 2023-10-06 DIAGNOSIS — Z8616 Personal history of COVID-19: Secondary | ICD-10-CM | POA: Diagnosis not present

## 2023-10-06 DIAGNOSIS — I10 Essential (primary) hypertension: Secondary | ICD-10-CM | POA: Diagnosis not present

## 2023-10-06 DIAGNOSIS — Z7901 Long term (current) use of anticoagulants: Secondary | ICD-10-CM | POA: Insufficient documentation

## 2023-10-06 HISTORY — PX: POLYPECTOMY: SHX5525

## 2023-10-06 HISTORY — PX: COLONOSCOPY WITH PROPOFOL: SHX5780

## 2023-10-06 SURGERY — COLONOSCOPY WITH PROPOFOL
Anesthesia: General

## 2023-10-06 MED ORDER — PROPOFOL 500 MG/50ML IV EMUL
INTRAVENOUS | Status: DC | PRN
  Administered 2023-10-06: 150 ug/kg/min via INTRAVENOUS
  Administered 2023-10-06: 50 mg via INTRAVENOUS

## 2023-10-06 MED ORDER — PHENYLEPHRINE HCL (PRESSORS) 10 MG/ML IV SOLN
INTRAVENOUS | Status: DC | PRN
Start: 2023-10-06 — End: 2023-10-06
  Administered 2023-10-06 (×2): 80 ug via INTRAVENOUS

## 2023-10-06 MED ORDER — SODIUM CHLORIDE 0.9 % IV SOLN
INTRAVENOUS | Status: DC
  Administered 2023-10-06: 20 mL/h via INTRAVENOUS

## 2023-10-06 MED ORDER — LIDOCAINE HCL (PF) 2 % IJ SOLN
INTRAMUSCULAR | Status: DC | PRN
  Administered 2023-10-06: 60 mg via INTRADERMAL

## 2023-10-06 MED ORDER — EPHEDRINE SULFATE (PRESSORS) 50 MG/ML IJ SOLN
INTRAMUSCULAR | Status: DC | PRN
Start: 2023-10-06 — End: 2023-10-06
  Administered 2023-10-06: 5 mg via INTRAVENOUS
  Administered 2023-10-06: 10 mg via INTRAVENOUS
  Administered 2023-10-06: 5 mg via INTRAVENOUS
  Administered 2023-10-06: 10 mg via INTRAVENOUS

## 2023-10-06 NOTE — H&P (Signed)
Wyline Mood, MD [address], [place], [place], [address] [address], [address], [place], [place], [address] Phone: [phone]  Fax: [phone]  Primary Care Physician:  Alba Cory, MD   Pre-Procedure History & Physical: HPI:  Tom Underwood is a 72 y.o. male is here for an colonoscopy.   Past Medical History:  Diagnosis Date   Anxiety    Arthritis    Atrial fibrillation (HCC)    a.) CHA2DS2-VASc = 4 (age, HTN, CVA x2). b.) s/p 200 J synchronized cardioversion (DCCV) on 12/08/2021. c.) rate/rythum maintained on oral metoprolol tartrate; chronically anticoagulated using rivaroxaban.   BPH (benign prostatic hyperplasia)    Complication of anesthesia    a.)  Intolerant of propofol; causes "skin burning and involuntary muscle twitching"   DDD (degenerative disc disease), lumbar    Decreased libido    Depression    Diverticulitis    Diverticulosis    GERD (gastroesophageal reflux disease)    Hepatic steatosis    History of 2019 novel coronavirus disease (COVID-19) 12/28/2020   History of cardiac murmur in childhood    History of kidney stones    HLD (hyperlipidemia)    Hypertension    Hypogonadism in male    OSA on CPAP    Pre-diabetes    Rhinitis, allergic    Stroke (HCC) 07/23/2019   a.) LEFT posterior cor radiata. b.) residual RIGHT sided weakness    Past Surgical History:  Procedure Laterality Date   CARDIOVERSION N/A 12/08/2021   Procedure: CARDIOVERSION;  Surgeon: Yvonne Kendall, MD;  Location: [facility];  Service: Cardiovascular;  Laterality: N/A;   COLONOSCOPY     EVALUATION UNDER ANESTHESIA WITH HEMORRHOIDECTOMY N/A 04/15/2022   Procedure: EXAM UNDER ANESTHESIA WITH HEMORRHOIDECTOMY;  Surgeon: Sung Amabile, DO;  Location: [facility];  Service: General;  Laterality: N/A;   EXTERNAL EAR SURGERY     EYE SURGERY     FINGER SURGERY Right    lateration to 5 th digit   KNEE SURGERY     left eye     RECTAL EXAM UNDER ANESTHESIA N/A  04/23/2022   Procedure: RECTAL EXAM UNDER ANESTHESIA WITH LIGATION OF BLEEDING;  Surgeon: Henrene Dodge, MD;  Location: [facility];  Service: General;  Laterality: N/A;    Prior to Admission medications   Medication Sig Start Date End Date Taking? Authorizing Provider  amitriptyline (ELAVIL) 25 MG tablet TAKE 1 TABLET BY MOUTH AT BEDTIME AS NEEDED FOR SLEEP 11/07/21  Yes Sowles, Danna Hefty, MD  aspirin EC 81 MG tablet Take 1 tablet (81 mg total) by mouth daily. Swallow whole. 01/23/23  Yes Agbor-Etang, Arlys John, MD  buPROPion (WELLBUTRIN XL) 150 MG 24 hr tablet Take 1 tablet (150 mg total) by mouth every morning. 05/24/23  Yes Sowles, Danna Hefty, MD  busPIRone (BUSPAR) 10 MG tablet TAKE 1 TABLET BY MOUTH TWICE A DAY 08/30/23  Yes Berniece Salines, FNP  Cholecalciferol (VITAMIN D) 125 MCG (5000 UT) CAPS Take 5,000 Units by mouth daily.   Yes [provider]  finasteride (PROSCAR) 5 MG tablet Take 1 tablet (5 mg total) by mouth daily. 02/06/23  Yes McGowan, Carollee Herter A, PA-C  fluticasone (FLONASE) 50 MCG/ACT nasal spray SPRAY 2 SPRAYS INTO EACH NOSTRIL EVERY DAY 05/24/23  Yes Sowles, Danna Hefty, MD  furosemide (LASIX) 20 MG tablet Take 1 tablet (20 mg total) by mouth as needed. 09/20/23 12/19/23 Yes Agbor-Etang, Arlys John, MD  GLUCOSAMINE-CHONDROITIN PO Take 2 tablets by mouth in the morning and at bedtime.  Yes [provider]  lidocaine (XYLOCAINE) 5 % ointment Apply 1 application. topically 3 (three) times daily as needed. Apply pea size amount to area as needed 04/26/22  Yes Sakai, Isami, DO  metoprolol tartrate (LOPRESSOR) 25 MG tablet Take 1 tablet (25 mg total) by mouth 2 (two) times daily. 09/20/23 09/19/24 Yes Agbor-Etang, Arlys John, MD  Multiple Vitamin (MULTIVITAMIN) tablet Take 1 tablet by mouth daily.   Yes [provider]  omega-3 acid ethyl esters (LOVAZA) 1 g capsule Take 2 capsules (2 g total) by mouth 2 (two) times daily. 05/24/23  Yes Sowles, Danna Hefty, MD  pantoprazole (PROTONIX) 20 MG  tablet Take 1 tablet (20 mg total) by mouth daily. 05/24/23  Yes Sowles, Danna Hefty, MD  rosuvastatin (CRESTOR) 20 MG tablet Take 1 tablet (20 mg total) by mouth daily. 09/20/23  Yes Agbor-Etang, Arlys John, MD  Saccharomyces boulardii (PROBIOTIC) 250 MG CAPS Take 1 capsule by mouth daily. 02/16/18  Yes Sowles, Danna Hefty, MD  tamsulosin (FLOMAX) 0.4 MG CAPS capsule Take 1 capsule (0.4 mg total) by mouth daily. 05/24/23  Yes Sowles, Danna Hefty, MD  valsartan (DIOVAN) 80 MG tablet Take 1 tablet (80 mg total) by mouth daily. 09/20/23  Yes Agbor-Etang, Arlys John, MD  VISINE DRY EYE RELIEF 1 % SOLN Place 1 drop into both eyes daily as needed (dry eyes). 06/10/20  Yes [provider]    Allergies as of 09/05/2023 - Review Complete 08/16/2023  Allergen Reaction Noted   Penicillins Itching 06/30/2015    Family History  Problem Relation Age of Onset   Asthma Mother    Heart disease Mother    Heart attack Mother    Dementia Mother    Asthma Father    Heart disease Father    Stroke Father     Social History   Socioeconomic History   Marital status: Married    Spouse name: [name]    Number of children: 2   Years of education: Not on file   Highest education level: Not on file  Occupational History   Occupation: retired   Tobacco Use   Smoking status: Former    Types: Pipe    Start date: 02/20/1973    Quit date: 02/20/1974    Years since quitting: 49.6   Smokeless tobacco: Never  Vaping Use   Vaping status: Never Used  Substance and Sexual Activity   Alcohol use: No    Alcohol/week: 0.0 standard drinks of alcohol   Drug use: No   Sexual activity: Yes    Partners: Female  Other Topics Concern   Not on file  Social History Narrative   Not on file   Social Determinants of Health   Financial Resource Strain: Low Risk  (05/26/2023)   Overall Financial Resource Strain (CARDIA)    Difficulty of Paying Living Expenses: Not very hard  Food Insecurity: No Food Insecurity (05/26/2023)   Hunger  Vital Sign    Worried About Running Out of Food in the Last Year: Never true    Ran Out of Food in the Last Year: Never true  Transportation Needs: No Transportation Needs (05/26/2023)   PRAPARE - Administrator, Civil Service (Medical): No    Lack of Transportation (Non-Medical): No  Physical Activity: Insufficiently Active (05/26/2023)   Exercise Vital Sign    Days of Exercise per Week: 3 days    Minutes of Exercise per Session: 30 min  Stress: No Stress Concern Present (05/26/2023)   [employer] of Occupational Health - Occupational  Stress Questionnaire    Feeling of Stress : Not at all  Social Connections: Socially Integrated (05/26/2023)   Social Connection and Isolation Panel [NHANES]    Frequency of Communication with Friends and Family: More than three times a week    Frequency of Social Gatherings with Friends and Family: Twice a week    Attends Religious Services: More than 4 times per year    Active Member of [employer] or Organizations: Yes    Attends Engineer, structural: More than 4 times per year    Marital Status: Married  Catering manager Violence: Not At Risk (05/26/2023)   Humiliation, Afraid, Rape, and Kick questionnaire    Fear of Current or Ex-Partner: No    Emotionally Abused: No    Physically Abused: No    Sexually Abused: No    Review of Systems: See HPI, otherwise negative ROS  Physical Exam: BP (!) 132/52   Pulse (!) 51   Temp (!) 96.6 F (35.9 C) (Temporal)   Resp 20   Ht 5\' 10"  (1.778 m)   Wt 134 kg   SpO2 98%   BMI 42.39 kg/m  General:   Alert,  pleasant and cooperative in NAD Head:  Normocephalic and atraumatic. Neck:  Supple; no masses or thyromegaly. Lungs:  Clear throughout to auscultation, normal respiratory effort.    Heart:  +S1, +S2, Regular rate and rhythm, No edema. Abdomen:  Soft, nontender and nondistended. Normal bowel sounds, without guarding, and without rebound.   Neurologic:  Alert and  oriented x4;   grossly normal neurologically.  Impression/Plan: Tom Underwood is here for an colonoscopy to be performed for Screening colonoscopy average risk   Risks, benefits, limitations, and alternatives regarding  colonoscopy have been reviewed with the patient.  Questions have been answered.  All parties agreeable.   Wyline Mood, MD  10/06/2023, 9:28 AM

## 2023-10-06 NOTE — Transfer of Care (Signed)
Immediate Anesthesia Transfer of Care Note  Patient: Tom Underwood  Procedure(s) Performed: COLONOSCOPY WITH PROPOFOL POLYPECTOMY  Patient Location: PACU  Anesthesia Type:General  Level of Consciousness: awake  Airway & Oxygen Therapy: Patient Spontanous Breathing  Post-op Assessment: Report given to RN and Post -op Vital signs reviewed and stable  Post vital signs: Reviewed and stable  Last Vitals:  Vitals Value Taken Time  BP    Temp    Pulse 60 10/06/23 1036  Resp 20 10/06/23 1036  SpO2 96 % 10/06/23 1036  Vitals shown include unfiled device data.  Last Pain:  Vitals:   10/06/23 0924  TempSrc: Temporal  PainSc: 0-No pain         Complications: There were no known notable events for this encounter.

## 2023-10-06 NOTE — Anesthesia Preprocedure Evaluation (Addendum)
Anesthesia Evaluation  Patient identified by MRN, date of birth, ID band Patient awake    Reviewed: Allergy & Precautions, H&P , NPO status , Patient's Chart, lab work & pertinent test results  History of Anesthesia Complications Negative for: history of anesthetic complications  Airway Mallampati: III  TM Distance: >3 FB Neck ROM: full    Dental  (+) Missing,    Pulmonary sleep apnea , neg COPD, former smoker  Tachypneic  breath sounds clear to auscultation       Cardiovascular hypertension, (-) angina (-) Past MI, (-) Cardiac Stents, (-) CABG and (-) CHF + dysrhythmias (s/p DCCV 12/2021)  Rhythm:regular Rate:Normal     Neuro/Psych  PSYCHIATRIC DISORDERS Anxiety Depression    CVA (right leg and right arm weakness; walks with cane), Residual Symptoms    GI/Hepatic Neg liver ROS,GERD  ,,  Endo/Other    Morbid obesity  Renal/GU negative Renal ROS  negative genitourinary   Musculoskeletal  (+) Arthritis ,    Abdominal  (+) + obese  Peds  Hematology Anticoagulated on Xarelto Received KCentra  Hgb stable at 14   Anesthesia Other Findings Past Medical History: No date: Anxiety No date: Arthritis No date: Atrial fibrillation (HCC)     Comment:  a.) CHA2DS2-VASc = 4 (age, HTN, CVA x2). b.) s/p 200 J               synchronized cardioversion (DCCV) on 12/08/2021. c.)               rate/rythum maintained on oral metoprolol tartrate;               chronically anticoagulated using rivaroxaban. No date: BPH (benign prostatic hyperplasia) No date: Complication of anesthesia     Comment:  a.)  Intolerant of propofol; causes "skin burning and               involuntary muscle twitching" No date: DDD (degenerative disc disease), lumbar No date: Decreased libido No date: Depression No date: Diverticulitis No date: Diverticulosis No date: GERD (gastroesophageal reflux disease) No date: Hepatic steatosis 12/28/2020: History of  2019 novel coronavirus disease (COVID-19) No date: History of cardiac murmur in childhood No date: History of kidney stones No date: HLD (hyperlipidemia) No date: HLD (hyperlipidemia) No date: Hypertension No date: Hypogonadism in male No date: Long term current use of anticoagulant     Comment:  a.) rivaroxaban No date: OSA on CPAP No date: Pre-diabetes No date: Rhinitis, allergic 07/23/2019: Stroke (HCC)     Comment:  a.) LEFT posterior cor radiata. b.) residual RIGHT sided              weakness  Past Surgical History: 12/08/2021: CARDIOVERSION; N/A     Comment:  Procedure: CARDIOVERSION;  Surgeon: Yvonne Kendall,               MD;  Location: [facility];  Service: Cardiovascular;                Laterality: N/A; No date: COLONOSCOPY 04/15/2022: EVALUATION UNDER ANESTHESIA WITH HEMORRHOIDECTOMY; N/A     Comment:  Procedure: EXAM UNDER ANESTHESIA WITH HEMORRHOIDECTOMY;               Surgeon: Sung Amabile, DO;  Location: [facility];  Service:              General;  Laterality: N/A; No date: EXTERNAL EAR SURGERY No date: EYE SURGERY No date: FINGER SURGERY; Right     Comment:  lateration to  5 th digit No date: KNEE SURGERY No date: left eye  BMI    Body Mass Index: 40.81 kg/m      Reproductive/Obstetrics negative OB ROS                             Anesthesia Physical Anesthesia Plan  ASA: 3  Anesthesia Plan: General   Post-op Pain Management: Minimal or no pain anticipated   Induction: Intravenous  PONV Risk Score and Plan: Propofol infusion and TIVA  Airway Management Planned: Natural Airway  Additional Equipment:   Intra-op Plan:   Post-operative Plan:   Informed Consent: I have reviewed the patients History and Physical, chart, labs and discussed the procedure including the risks, benefits and alternatives for the proposed anesthesia with the patient or authorized representative who has indicated his/her understanding and acceptance.      Dental Advisory Given  Plan Discussed with: Anesthesiologist, CRNA and Surgeon  Anesthesia Plan Comments: (Pt received propofol with no noted reactions. He is okay receiving this medication)        Anesthesia Quick Evaluation

## 2023-10-06 NOTE — Op Note (Signed)
[facility] Patient Name: Tom Underwood Procedure Date: 10/06/2023 10:03 AM MRN: [id number] Account #: [id number] Date of Birth: [birth year] Admit Type: Outpatient Age: 72 Room: [facility] ENDO ROOM 4 Gender: Male Note Status: Finalized Instrument Name: Nelda Marseille [id number] Procedure:             Colonoscopy Indications:           Screening for colorectal malignant neoplasm Providers:             Wyline Mood MD, MD Referring MD:          Onnie Boer. Sowles, MD (Referring MD) Medicines:             Monitored Anesthesia Care Complications:         No immediate complications. Procedure:             Pre-Anesthesia Assessment:                        - Prior to the procedure, a History and Physical was                         performed, and patient medications, allergies and                         sensitivities were reviewed. The patient's tolerance                         of previous anesthesia was reviewed.                        - The risks and benefits of the procedure and the                         sedation options and risks were discussed with the                         patient. All questions were answered and informed                         consent was obtained.                        - ASA Grade Assessment: II - A patient with mild                         systemic disease.                        After obtaining informed consent, the colonoscope was                         passed under direct vision. Throughout the procedure,                         the patient's blood pressure, pulse, and oxygen                         saturations were monitored continuously. The                         Colonoscope was  introduced through the anus and                         advanced to the the cecum, identified by the                         appendiceal orifice. The colonoscopy was performed                         without difficulty. The patient tolerated the                          procedure well. The quality of the bowel preparation                         was good. The ileocecal valve, appendiceal orifice,                         and rectum were photographed. Findings:      The perianal and digital rectal examinations were normal.      Multiple medium-mouthed diverticula were found in the left colon.      Two sessile polyps were found in the descending colon. The polyps were 5       to 7 mm in size. These polyps were removed with a cold snare. Resection       and retrieval were complete.      The exam was otherwise without abnormality on direct and retroflexion       views. Impression:            - Diverticulosis in the left colon.                        - Two 5 to 7 mm polyps in the descending colon,                         removed with a cold snare. Resected and retrieved.                        - The examination was otherwise normal on direct and                         retroflexion views. Recommendation:        - Discharge patient to home (with escort).                        - Resume previous diet.                        - Continue present medications.                        - Await pathology results.                        - Repeat colonoscopy for surveillance based on                         pathology results. Procedure Code(s):     --- Professional ---  16109, Colonoscopy, flexible; with removal of                         tumor(s), polyp(s), or other lesion(s) by snare                         technique Diagnosis Code(s):     --- Professional ---                        Z12.11, Encounter for screening for malignant neoplasm                         of colon                        D12.4, Benign neoplasm of descending colon                        K57.30, Diverticulosis of large intestine without                         perforation or abscess without bleeding CPT copyright 2022 American Medical Association. All rights  reserved. The codes documented in this report are preliminary and upon coder review may  be revised to meet current compliance requirements. Wyline Mood, MD Wyline Mood MD, MD 10/06/2023 10:30:43 AM This report has been signed electronically. Number of Addenda: 0 Note Initiated On: 10/06/2023 10:03 AM Scope Withdrawal Time: 0 hours 14 minutes 9 seconds  Total Procedure Duration: 0 hours 16 minutes 53 seconds  Estimated Blood Loss:  Estimated blood loss: none.      [facility]

## 2023-10-06 NOTE — Anesthesia Postprocedure Evaluation (Signed)
Anesthesia Post Note  Patient: Tom Underwood  Procedure(s) Performed: COLONOSCOPY WITH PROPOFOL POLYPECTOMY  Patient location during evaluation: PACU Anesthesia Type: General Level of consciousness: awake and alert Pain management: pain level controlled Vital Signs Assessment: post-procedure vital signs reviewed and stable Respiratory status: spontaneous breathing, nonlabored ventilation and respiratory function stable Cardiovascular status: blood pressure returned to baseline and stable Postop Assessment: no apparent nausea or vomiting Anesthetic complications: no   There were no known notable events for this encounter.   Last Vitals:  Vitals:   10/06/23 0924 10/06/23 1037  BP: (!) 132/52 (!) 127/56  Pulse: (!) 51   Resp: 20   Temp: (!) 35.9 C (!) 35.8 C  SpO2: 98%     Last Pain:  Vitals:   10/06/23 1057  TempSrc:   PainSc: 0-No pain                 Foye Deer

## 2023-10-09 ENCOUNTER — Encounter: Payer: Self-pay | Admitting: Gastroenterology

## 2023-10-09 LAB — SURGICAL PATHOLOGY

## 2023-10-23 ENCOUNTER — Ambulatory Visit: Payer: Medicare Other | Admitting: Physical Therapy

## 2023-10-24 ENCOUNTER — Ambulatory Visit (INDEPENDENT_AMBULATORY_CARE_PROVIDER_SITE_OTHER): Payer: Medicare Other | Admitting: Professional

## 2023-10-24 ENCOUNTER — Encounter: Payer: Self-pay | Admitting: Professional

## 2023-10-24 DIAGNOSIS — F320 Major depressive disorder, single episode, mild: Secondary | ICD-10-CM | POA: Diagnosis not present

## 2023-10-24 DIAGNOSIS — Z634 Disappearance and death of family member: Secondary | ICD-10-CM
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
## 2023-10-24 NOTE — Progress Notes (Signed)
° ° ° ° ° ° ° ° ° ° ° ° ° ° °  Araya Roel, LCMHC °

## 2023-10-25 ENCOUNTER — Ambulatory Visit: Payer: Medicare Other | Attending: Family Medicine

## 2023-10-25 DIAGNOSIS — R269 Unspecified abnormalities of gait and mobility: Secondary | ICD-10-CM | POA: Diagnosis not present

## 2023-10-25 DIAGNOSIS — R2681 Unsteadiness on feet: Secondary | ICD-10-CM | POA: Diagnosis not present

## 2023-10-25 DIAGNOSIS — M6281 Muscle weakness (generalized): Secondary | ICD-10-CM

## 2023-10-25 DIAGNOSIS — R262 Difficulty in walking, not elsewhere classified: Secondary | ICD-10-CM

## 2023-10-25 NOTE — Therapy (Signed)
OUTPATIENT PHYSICAL THERAPY PROGRESS NOTE/RECERTIFICATION             07/11/2023 - 10/04/2023 Patient Name: Tom Underwood MRN: [id number] DOB:March 10, 1951, 72 y.o., male Today's Date: 10/25/2023  PCP: Alba Cory, MD REFERRING PROVIDER: Caro Laroche, DO    PT End of Session - 10/25/23 1303     Visit Number 61    Number of Visits 67    Date for PT Re-Evaluation 11/01/23               Past Medical History:  Diagnosis Date   Anxiety    Arthritis    Atrial fibrillation (HCC)    a.) CHA2DS2-VASc = 4 (age, HTN, CVA x2). b.) s/p 200 J synchronized cardioversion (DCCV) on 12/08/2021. c.) rate/rythum maintained on oral metoprolol tartrate; chronically anticoagulated using rivaroxaban.   BPH (benign prostatic hyperplasia)    DDD (degenerative disc disease), lumbar    Decreased libido    Depression    Diverticulitis    Diverticulosis    GERD (gastroesophageal reflux disease)    Hepatic steatosis    History of 2019 novel coronavirus disease (COVID-19) 12/28/2020   History of cardiac murmur in childhood    History of kidney stones    HLD (hyperlipidemia)    Hypertension    Hypogonadism in male    OSA on CPAP    Pre-diabetes    Rhinitis, allergic    Stroke (HCC) 07/23/2019   a.) LEFT posterior cor radiata. b.) residual RIGHT sided weakness   Past Surgical History:  Procedure Laterality Date   CARDIOVERSION N/A 12/08/2021   Procedure: CARDIOVERSION;  Surgeon: Yvonne Kendall, MD;  Location: [facility] ORS;  Service: Cardiovascular;  Laterality: N/A;   COLONOSCOPY     COLONOSCOPY WITH PROPOFOL N/A 10/06/2023   Procedure: COLONOSCOPY WITH PROPOFOL;  Surgeon: Wyline Mood, MD;  Location: [facility] ENDOSCOPY;  Service: Gastroenterology;  Laterality: N/A;   EVALUATION UNDER ANESTHESIA WITH HEMORRHOIDECTOMY N/A 04/15/2022   Procedure: EXAM UNDER ANESTHESIA WITH HEMORRHOIDECTOMY;  Surgeon: Sung Amabile, DO;  Location: [facility] ORS;  Service: General;  Laterality: N/A;   EXTERNAL EAR  SURGERY     EYE SURGERY     FINGER SURGERY Right    lateration to 5 th digit   KNEE SURGERY     left eye     POLYPECTOMY  10/06/2023   Procedure: POLYPECTOMY;  Surgeon: Wyline Mood, MD;  Location: [facility] ENDOSCOPY;  Service: Gastroenterology;;   RECTAL EXAM UNDER ANESTHESIA N/A 04/23/2022   Procedure: RECTAL EXAM UNDER ANESTHESIA WITH LIGATION OF BLEEDING;  Surgeon: Henrene Dodge, MD;  Location: [facility] ORS;  Service: General;  Laterality: N/A;   Patient Active Problem List   Diagnosis Date Noted   Colon cancer screening 10/06/2023   Adenomatous polyp of colon 10/06/2023   Gastroesophageal reflux disease without esophagitis 05/17/2022   Dysthymia 05/17/2022   History of syncope 05/17/2022   History of hemorrhoidectomy 05/17/2022   Vitamin D deficiency 05/17/2022   Lower extremity edema 05/17/2022   History of rectal bleeding 05/17/2022   Bleeding internal hemorrhoids    Morbid obesity (HCC)    Persistent atrial fibrillation (HCC) 07/14/2020   Hypertension    Hemiparesis of right dominant side as late effect of cerebral infarction (HCC) 08/13/2019   History of kidney stones 05/02/2018   ED (erectile dysfunction) 05/02/2018   Hyperglycemia 01/30/2017   Arthritis of knee, degenerative 01/30/2017   Benign prostatic hyperplasia without lower urinary tract symptoms 02/03/2016   Dyslipidemia 08/04/2015   Diverticulitis of large intestine  without perforation or abscess without bleeding 06/30/2015   OSA on CPAP 06/30/2015    REFERRING DIAG: M25.511,M25.512 (ICD-10-CM) - Bilateral shoulder pain, unspecified chronicity M17.2 (ICD-10-CM) - Post-traumatic osteoarthritis of both knees  R26.81 (ICD-10-CM) - Gait instability   THERAPY DIAG:  Muscle weakness (generalized)   Unsteadiness on feet   Abnormality of gait and mobility   Unspecified lack of coordination   Other abnormalities of gait and mobility   Difficulty in walking, not elsewhere classified  PERTINENT HISTORY: See  evaluation  PRECAUTIONS: Fall  10/2:  : 38 feet x 16 laps plus 12 feet (620 feet).  HR: 65 bpm/ O2 sat. 96%.  2/19: : 68 feet x 6laps plus 10 ft. = (418)  Post VS: HR- 67 O2 Sat: 97  8/21: TUG: 21.3 sec. (No SPC), 15.8 sec. (with SPC).  8/21: DGI: 14/24  SUBJECTIVE:  10/25/2023    Pt reports feeling tired and swollen in 4 extremities and stomach. Also reported of SOB.  TODAY'S TREATMENT:              There Act.:             Sitting BP at start of session: 148/81, 97  % O2, 56 bpm  Seated FAQ #3 3 x 20 reps   Seated marches #3 3 x 20 reps  STS from higher mat 3 x 10 reps.   BP 144/50 HR 51 SPO2 94% after above ex. Session terminated.       Not Today:  Nustep L6 10 min. B UE/LE.  Seat position #10/ arm position #10.  0.52  miles.  Increase cadence/no increase c/o knee pain   : 460 feet; vitals post : 96% O2, 58 bpm, 132/49 sitting BP  TUG: 22.05 seconds with SPC  DGI: 15/24  LOWER EXTREMITY MMT:    MMT Right eval Left eval  Hip flexion 4 5  Hip extension    Hip abduction 4 4  Hip adduction 4 4  Hip internal rotation    Hip external rotation    Knee flexion 5 5   Knee extension 5 5  Ankle dorsiflexion 5 5  Ankle plantarflexion    Ankle inversion    Ankle eversion     (Blank rows = not tested)      Not today: High knees/backwards walking/ sidesteps in // bars: 3 laps each direction - pt reports bilateral LE fatigue (L > R) Standing Nautilus: 70# lat. Pull downs with added shoulder flexion holds 22x2, 70# Tricep pushdowns 2x6 TRX Squats: 2x22 - 8/10 on exertion scale after first set, pt reports increased knee fatigue towards end of each set Standing 6-inch step taps, UE support as needed: 2x20 taps Resisted Gait with Black TB: 3x each direction - 7/10 exertion scale; pt reported mild increase in R hip pain when side-stepping to the left  Walking High Knees in // bars: 3 laps with no UE support Seated B shoulder flexion/ abduction 20x.   Seated LAQ/marching/ heel raises.  Assessment of B lower leg/ ankle swelling and use of compression socks.   Walking in clinic/ hallway/ out to car with use of SPC and moderate cuing to correct upright posture/consistent step pattern.  Walking through grass/mulch/sidewalk and up/down curb.   TRX squats 22x2, Chair behind patient for cue on squat depth.   PATIENT EDUCATION: Education details: HEP/ daily activity Person educated: Patient Education method: Medical illustrator Education comprehension: verbalized understanding and returned demonstration   HOME EXERCISE PROGRAM: Handouts  given for shoulder ROM/ strengthening/ standing hip ex.    PT Short Term Goals -  08/29/23      PT SHORT TERM GOAL #1   Title Pt able to complete and ambulate >800 feet with no assistive device safely.     Baseline 10/30: 460 feet. HR 58 bpm/ O2 sat 96% 10/2:  620 feet.  HR: 65 bpm/ O2 sat. 95%. 2/19: 418 feet. HR: 67 bpm/ O2 sat.97%   Time 4    Period Weeks    Status Partially met          PT Long Term Goals - 10/03/23      PT LONG TERM GOAL #1   Title Pt. Able to complete 10 steps with recip. Pattern and 1 handrail assist with mod. Independence safely.    Baseline Pt. Limited with R hip flexion/ recip. Gait pattern and requires heavy UE assist.  Pt. Is considering getting a ramp access at home.    Time 8   Period Weeks    Status Ongoing   Target Date 11/01/2023     PT LONG TERM GOAL #2   Title Pt wil improve TUG score to < 12 sec with no AD to display significant imporvement in reduced risk of falls    Baseline 11/23: 26 sec (did not sit down immediately leading to higher time than he would've scored) 1/19: 14.44 sec.   3/7: 12.66 sec. 03/06/23: 11.41 seconds. 8/16: 21.3 sec. (No SPC), 15.8 sec. (with SPC); 10/30: 22.05 seconds with SPC   Time 8   Period Weeks    Status Partially Met    Target Date 11/01/2023     PT LONG TERM GOAL #3   Title Pt will improve DGI to >19/24 to  indicate decreased risk of falls with household and community walking tasks    Baseline 11/23: 11/24 1/19:12/24 8/16: 14/24, 10/30: 15/24   Time 8    Period Weeks    Status Partially Met    Target Date 12/01/2023     PT LONG TERM GOAL #4   Title Pt. Will increase R hip flexion/ LE muscle strength 1/2 muscle grade to improve standing tolerance/ walking endurance.    Baseline See above.  B hip flexion 4+/5 MMT (good control on R hip today), 10/30: see for updated scores   Time 8    Period Weeks    Status Partially Met    Target Date 11/01/2023         Plan -     Clinical Impression Statement Pt presents with increased perceived exertion, SOB and not feeling well over all. Pt demonstrates increased edema in BLE/BUE and Stomach. Pt education abut RPE scale completed and advised pt to perform all ADLs with RPE <5/10. Pt BP dropped by 30 mmHg diastolic after mild exertion. Pt advised to seek ER care if the symtoms worsens and to retun for next PT session after his Cardiologist appointment which is in two weeks but pt is not sure. PT terminated today's Session.     Personal Factors and Comorbidities Comorbidity 2    Comorbidities Hypertension, Obesity    Examination-Activity Limitations Bed Mobility;Reach Overhead;Squat;Lift;Dressing;Hygiene/Grooming    Examination-Participation Restrictions Community Activity;Yard Work    Conservation officer, historic buildings Evolving/Moderate complexity    Clinical Decision Making Moderate    Rehab Potential Fair    PT Frequency 1x / week    PT Duration 12 weeks    PT Treatment/Interventions ADLs/Self Care Home Management;Gait training;Stair training;Functional mobility training;Therapeutic  activities;Therapeutic exercise;Balance training;Neuromuscular re-education;Manual techniques;Patient/family education;Electrical Stimulation;Moist Heat;Cryotherapy    PT POC Discuss activity level/  RECERTIFICATION   PT Home Exercise Plan Progress endurance training to  improve functional activity, follow-up on RPE scale (6-7/10), L knee/patellar mobilizations as necessary   Consulted and Agree with Plan of Care Patient         Janet Berlin PT DPT 2:22 PM,10/25/23

## 2023-10-31 ENCOUNTER — Ambulatory Visit (INDEPENDENT_AMBULATORY_CARE_PROVIDER_SITE_OTHER): Payer: Medicare Other | Admitting: Vascular Surgery

## 2023-10-31 ENCOUNTER — Ambulatory Visit: Payer: Medicare Other | Admitting: Physical Therapy

## 2023-10-31 ENCOUNTER — Encounter (INDEPENDENT_AMBULATORY_CARE_PROVIDER_SITE_OTHER): Payer: Self-pay | Admitting: Vascular Surgery

## 2023-10-31 VITALS — BP 136/64 | HR 50 | Resp 18 | Wt 307.0 lb

## 2023-10-31 DIAGNOSIS — M7989 Other specified soft tissue disorders: Secondary | ICD-10-CM

## 2023-10-31 DIAGNOSIS — I10 Essential (primary) hypertension: Secondary | ICD-10-CM | POA: Diagnosis not present

## 2023-10-31 DIAGNOSIS — I639 Cerebral infarction, unspecified: Secondary | ICD-10-CM | POA: Diagnosis not present

## 2023-10-31 DIAGNOSIS — I69351 Hemiplegia and hemiparesis following cerebral infarction affecting right dominant side: Secondary | ICD-10-CM

## 2023-10-31 NOTE — Assessment & Plan Note (Signed)
Residual right sided weakness after his stroke has impaired his mobility and left his swelling much worse on that side.

## 2023-10-31 NOTE — Assessment & Plan Note (Signed)
Weight loss would be of benefit in reducing lower extremity edema.

## 2023-10-31 NOTE — Assessment & Plan Note (Signed)

## 2023-10-31 NOTE — Progress Notes (Signed)
Patient ID: Tom Underwood, male   DOB: [birth year], 72 y.o.   MRN: [id number]  Chief Complaint  Patient presents with   New Patient (Initial Visit)    Ref Karenann Cai consult for localize edema    HPI Tom Underwood is a 72 y.o. male.  I am asked to see the patient by Dr. Martha Clan for evaluation of lower extremity swelling.  The patient had a stroke with resultant right-sided weakness about 4 years ago.  Since that time, he has had progressively worsening swelling predominantly on the right side although he has now developed significant swelling on the left side as well.  His mobility was certainly impaired by this and remains impaired to this day.  He sits with his feet dependent much of the day.  He tried getting some compression sock a time back but has not worn these regularly.  He has no known history of DVT or superficial thrombophlebitis to his knowledge.  He does try to elevate his legs.  The legs never go back to normal size, but in the mornings they are not quite as swollen as they are as the day goes on.  No chest pain or shortness of breath.  No fevers or chills.     Past Medical History:  Diagnosis Date   Anxiety    Arthritis    Atrial fibrillation (HCC)    a.) CHA2DS2-VASc = 4 (age, HTN, CVA x2). b.) s/p 200 J synchronized cardioversion (DCCV) on 12/08/2021. c.) rate/rythum maintained on oral metoprolol tartrate; chronically anticoagulated using rivaroxaban.   BPH (benign prostatic hyperplasia)    DDD (degenerative disc disease), lumbar    Decreased libido    Depression    Diverticulitis    Diverticulosis    GERD (gastroesophageal reflux disease)    Hepatic steatosis    History of 2019 novel coronavirus disease (COVID-19) 12/28/2020   History of cardiac murmur in childhood    History of kidney stones    HLD (hyperlipidemia)    Hypertension    Hypogonadism in male    OSA on CPAP    Pre-diabetes    Rhinitis, allergic    Stroke (HCC) 07/23/2019   a.) LEFT  posterior cor radiata. b.) residual RIGHT sided weakness    Past Surgical History:  Procedure Laterality Date   CARDIOVERSION N/A 12/08/2021   Procedure: CARDIOVERSION;  Surgeon: Yvonne Kendall, MD;  Location: [facility] ORS;  Service: Cardiovascular;  Laterality: N/A;   COLONOSCOPY     COLONOSCOPY WITH PROPOFOL N/A 10/06/2023   Procedure: COLONOSCOPY WITH PROPOFOL;  Surgeon: Wyline Mood, MD;  Location: [facility] ENDOSCOPY;  Service: Gastroenterology;  Laterality: N/A;   EVALUATION UNDER ANESTHESIA WITH HEMORRHOIDECTOMY N/A 04/15/2022   Procedure: EXAM UNDER ANESTHESIA WITH HEMORRHOIDECTOMY;  Surgeon: Sung Amabile, DO;  Location: [facility] ORS;  Service: General;  Laterality: N/A;   EXTERNAL EAR SURGERY     EYE SURGERY     FINGER SURGERY Right    lateration to 5 th digit   KNEE SURGERY     left eye     POLYPECTOMY  10/06/2023   Procedure: POLYPECTOMY;  Surgeon: Wyline Mood, MD;  Location: [facility] ENDOSCOPY;  Service: Gastroenterology;;   RECTAL EXAM UNDER ANESTHESIA N/A 04/23/2022   Procedure: RECTAL EXAM UNDER ANESTHESIA WITH LIGATION OF BLEEDING;  Surgeon: Henrene Dodge, MD;  Location: [facility] ORS;  Service: General;  Laterality: N/A;     Family History  Problem Relation Age of Onset   Asthma Mother    Heart disease Mother  Heart attack Mother    Dementia Mother    Asthma Father    Heart disease Father    Stroke Father       Social History   Tobacco Use   Smoking status: Former    Types: Pipe    Start date: 02/20/1973    Quit date: 02/20/1974    Years since quitting: 49.7   Smokeless tobacco: Never  Vaping Use   Vaping status: Never Used  Substance Use Topics   Alcohol use: No    Alcohol/week: 0.0 standard drinks of alcohol   Drug use: No    Allergies  Allergen Reactions   Penicillins Itching    Current Outpatient Medications  Medication Sig Dispense Refill   amitriptyline (ELAVIL) 25 MG tablet TAKE 1 TABLET BY MOUTH AT BEDTIME AS NEEDED FOR SLEEP 90 tablet 0   aspirin EC 81  MG tablet Take 1 tablet (81 mg total) by mouth daily. Swallow whole. 90 tablet 3   buPROPion (WELLBUTRIN XL) 150 MG 24 hr tablet Take 1 tablet (150 mg total) by mouth every morning. 90 tablet 1   busPIRone (BUSPAR) 10 MG tablet TAKE 1 TABLET BY MOUTH TWICE A DAY 180 tablet 1   Cholecalciferol (VITAMIN D) 125 MCG (5000 UT) CAPS Take 5,000 Units by mouth daily.     finasteride (PROSCAR) 5 MG tablet Take 1 tablet (5 mg total) by mouth daily. 90 tablet 2   fluticasone (FLONASE) 50 MCG/ACT nasal spray SPRAY 2 SPRAYS INTO EACH NOSTRIL EVERY DAY 48 mL 0   furosemide (LASIX) 20 MG tablet Take 1 tablet (20 mg total) by mouth as needed. 90 tablet 0   GLUCOSAMINE-CHONDROITIN PO Take 2 tablets by mouth in the morning and at bedtime.     lidocaine (XYLOCAINE) 5 % ointment Apply 1 application. topically 3 (three) times daily as needed. Apply pea size amount to area as needed 35.44 g 0   metoprolol tartrate (LOPRESSOR) 25 MG tablet Take 1 tablet (25 mg total) by mouth 2 (two) times daily. 180 tablet 3   Multiple Vitamin (MULTIVITAMIN) tablet Take 1 tablet by mouth daily.     omega-3 acid ethyl esters (LOVAZA) 1 g capsule Take 2 capsules (2 g total) by mouth 2 (two) times daily. 360 capsule 1   pantoprazole (PROTONIX) 20 MG tablet Take 1 tablet (20 mg total) by mouth daily. 90 tablet 1   rosuvastatin (CRESTOR) 20 MG tablet Take 1 tablet (20 mg total) by mouth daily. 90 tablet 2   Saccharomyces boulardii (PROBIOTIC) 250 MG CAPS Take 1 capsule by mouth daily. 30 capsule 0   tamsulosin (FLOMAX) 0.4 MG CAPS capsule Take 1 capsule (0.4 mg total) by mouth daily. 90 capsule 1   valsartan (DIOVAN) 80 MG tablet Take 1 tablet (80 mg total) by mouth daily. 90 tablet 0   VISINE DRY EYE RELIEF 1 % SOLN Place 1 drop into both eyes daily as needed (dry eyes).     No current facility-administered medications for this visit.      REVIEW OF SYSTEMS (Negative unless checked)  Constitutional: [] Weight loss  [] Fever   [] Chills Cardiac: [] Chest pain   [] Chest pressure   [] Palpitations   [] Shortness of breath when laying flat   [] Shortness of breath at rest   [] Shortness of breath with exertion. Vascular:  [x] Pain in legs with walking   [x] Pain in legs at rest   [] Pain in legs when laying flat   [] Claudication   [] Pain in feet when walking  []   Pain in feet at rest  [] Pain in feet when laying flat   [] History of DVT   [] Phlebitis   [x] Swelling in legs   [] Varicose veins   [] Non-healing ulcers Pulmonary:   [] Uses home oxygen   [] Productive cough   [] Hemoptysis   [] Wheeze  [] COPD   [] Asthma Neurologic:  [] Dizziness  [] Blackouts   [] Seizures   [x] History of stroke   [] History of TIA  [] Aphasia   [] Temporary blindness   [] Dysphagia   [x] Weakness or numbness in arms   [x] Weakness or numbness in legs Musculoskeletal:  [] Arthritis   [] Joint swelling   [x] Joint pain   [] Low back pain Hematologic:  [] Easy bruising  [] Easy bleeding   [] Hypercoagulable state   [] Anemic  [] Hepatitis Gastrointestinal:  [] Blood in stool   [] Vomiting blood  [] Gastroesophageal reflux/heartburn   [] Abdominal pain Genitourinary:  [] Chronic kidney disease   [] Difficult urination  [] Frequent urination  [] Burning with urination   [] Hematuria Skin:  [] Rashes   [] Ulcers   [] Wounds Psychological:  [] History of anxiety   []  History of major depression.    Physical Exam BP 136/64 (BP Location: Left Arm)   Pulse (!) 50   Resp 18   Wt (!) 307 lb (139.3 kg)   BMI 44.05 kg/m  Gen:  WD/WN, NAD. Obese  Head: [place]/AT, No temporalis wasting.  Ear/Nose/Throat: Hearing grossly intact, nares w/o erythema or drainage, oropharynx w/o Erythema/Exudate Eyes: Conjunctiva clear, sclera non-icteric  Neck: trachea midline.  No JVD.  Pulmonary:  Good air movement, respirations not labored, no use of accessory muscles  Cardiac: RRR, no JVD Vascular:  Vessel Right Left  Radial Palpable Palpable                          DP NP NP  PT NP 1+   Gastrointestinal:.  No masses, surgical incisions, or scars. Musculoskeletal: M/S 5/5 throughout.  Extremities without ischemic changes.  No deformity or atrophy. 2-3+ RLE edema, 1-2+ LLE edema. Neurologic: Sensation grossly intact in extremities.  Symmetrical.  Speech is fluent. Motor exam as listed above. Psychiatric: Judgment intact, Mood & affect appropriate for pt's clinical situation. Dermatologic: No rashes or ulcers noted.  No cellulitis or open wounds.    Radiology No results found.  Labs Recent Results (from the past 2160 hour(s))  Surgical pathology     Status: None   Collection Time: 10/06/23 12:00 AM  Result Value Ref Range   SURGICAL PATHOLOGY      SURGICAL PATHOLOGY [facility] [address], [address] [place], [place] [address] Telephone [phone] or [phone] Fax [phone]  REPORT OF SURGICAL PATHOLOGY   Accession #: [id number] Patient Name: DEAIRE, CLENNON Visit # : [id number]  MRN: [id number] Physician: Orvan Falconer 1951-10-05 (Age: 15) Gender: M Collected Date: 10/06/2023 Received Date: 10/06/2023  FINAL DIAGNOSIS       1. Descending Colon Polyp, Cold snare x2 :       - TUBULAR ADENOMA (2).      - NEGATIVE FOR HIGH-GRADE DYSPLASIA AND MALIGNANCY.       DATE SIGNED OUT: 10/09/2023 ELECTRONIC SIGNATURE : Oneita Kras Md, Delice Bison , Pathologist, Electronic Signature  MICROSCOPIC DESCRIPTION  CASE COMMENTS STAINS USED IN DIAGNOSIS: H&E    CLINICAL HISTORY  SPECIMEN(S) OBTAINED 1. Descending Colon Polyp, Cold Snare X2  SPECIMEN COMMENTS: SPECIMEN CLINICAL INFORMATION: 1. Colon polyps.  Diverticulosis    Gross Description 1. "Cold snare descending col on polyp X2", received in formalin  is a 1.0 x 0.6 x 0.3 cm aggregate of 3 tan-pink tissue fragments an additional food-like material. The specimen is filtered and submitted in toto in 1 block (1A).      [facility] 10/06/2023        Report signed out from the following  location(s) [place] [address], [place] [address] CLIA #: [id number]  [facility] [address] [place], [place] [address] CLIA #: [id number]     Assessment/Plan:  Swelling of limb Recommend:  I have had a long discussion with the patient regarding swelling and why it  causes symptoms.  Patient will begin wearing graduated compression on a daily basis a prescription was given. The patient will  wear the stockings first thing in the morning and removing them in the evening. The patient is instructed specifically not to sleep in the stockings.   In addition, behavioral modification will be initiated.  This will include frequent elevation, use of over the counter pain medications and exercise such as walking.  Consideration for a lymph pump will also be made based upon the effectiveness of conservative therapy.  This would help to improve the edema control and prevent sequela such as ulcers and infections   Patient should undergo duplex ultrasound of the venous system to ensure that DVT or reflux is not present.  The patient will follow-up with me after the ultrasound.   Morbid obesity (HCC) Weight loss would be of benefit in reducing lower extremity edema.  Stroke [facility]) Residual right sided weakness after his stroke has impaired his mobility and left his swelling much worse on that side.  Hypertension blood pressure control important in reducing the progression of atherosclerotic disease. On appropriate oral medications.   Hemiparesis of right dominant side as late effect of cerebral infarction [facility]) Impair his mobility and make swelling worse.      Festus Barren 10/31/2023, 5:42 PM   This note was created with Dragon medical transcription system.  Any errors from dictation are unintentional.

## 2023-10-31 NOTE — Assessment & Plan Note (Signed)
Impair his mobility and make swelling worse.

## 2023-10-31 NOTE — Assessment & Plan Note (Signed)
blood pressure control important in reducing the progression of atherosclerotic disease. On appropriate oral medications.  

## 2023-11-01 ENCOUNTER — Ambulatory Visit: Payer: Medicare Other | Admitting: Physical Therapy

## 2023-11-04 ENCOUNTER — Other Ambulatory Visit: Payer: Self-pay | Admitting: Urology

## 2023-11-07 ENCOUNTER — Ambulatory Visit: Payer: Medicare Other | Admitting: Physical Therapy

## 2023-11-07 DIAGNOSIS — R2681 Unsteadiness on feet: Secondary | ICD-10-CM

## 2023-11-07 DIAGNOSIS — R262 Difficulty in walking, not elsewhere classified: Secondary | ICD-10-CM

## 2023-11-07 DIAGNOSIS — M6281 Muscle weakness (generalized): Secondary | ICD-10-CM

## 2023-11-08 ENCOUNTER — Ambulatory Visit: Payer: Medicare Other | Attending: Family Medicine | Admitting: Physical Therapy

## 2023-11-08 DIAGNOSIS — R2681 Unsteadiness on feet: Secondary | ICD-10-CM | POA: Diagnosis not present

## 2023-11-08 DIAGNOSIS — R2689 Other abnormalities of gait and mobility: Secondary | ICD-10-CM | POA: Diagnosis not present

## 2023-11-08 DIAGNOSIS — R279 Unspecified lack of coordination: Secondary | ICD-10-CM | POA: Insufficient documentation

## 2023-11-08 DIAGNOSIS — R269 Unspecified abnormalities of gait and mobility: Secondary | ICD-10-CM | POA: Diagnosis not present

## 2023-11-08 DIAGNOSIS — R262 Difficulty in walking, not elsewhere classified: Secondary | ICD-10-CM | POA: Insufficient documentation

## 2023-11-08 DIAGNOSIS — M6281 Muscle weakness (generalized): Secondary | ICD-10-CM | POA: Insufficient documentation

## 2023-11-10 ENCOUNTER — Other Ambulatory Visit: Payer: Self-pay | Admitting: *Deleted

## 2023-11-10 ENCOUNTER — Other Ambulatory Visit: Payer: Self-pay

## 2023-11-10 DIAGNOSIS — F341 Dysthymic disorder: Secondary | ICD-10-CM

## 2023-11-10 DIAGNOSIS — F419 Anxiety disorder, unspecified: Secondary | ICD-10-CM

## 2023-11-10 MED ORDER — VALSARTAN 80 MG PO TABS: 80.0000 mg | ORAL_TABLET | Freq: Every day | ORAL | 3 refills | Status: DC

## 2023-11-10 MED ORDER — FUROSEMIDE 20 MG PO TABS: 20.0000 mg | ORAL_TABLET | ORAL | 3 refills | Status: DC | PRN

## 2023-11-10 MED ORDER — BUSPIRONE HCL 10 MG PO TABS: 10.0000 mg | ORAL_TABLET | Freq: Two times a day (BID) | ORAL | 0 refills | Status: DC
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
## 2023-11-13 DIAGNOSIS — D485 Neoplasm of uncertain behavior of skin: Secondary | ICD-10-CM | POA: Diagnosis not present

## 2023-11-13 DIAGNOSIS — D235 Other benign neoplasm of skin of trunk: Secondary | ICD-10-CM | POA: Diagnosis not present

## 2023-11-13 DIAGNOSIS — D225 Melanocytic nevi of trunk: Secondary | ICD-10-CM | POA: Diagnosis not present

## 2023-11-13 DIAGNOSIS — L439 Lichen planus, unspecified: Secondary | ICD-10-CM | POA: Diagnosis not present

## 2023-11-13 DIAGNOSIS — D2261 Melanocytic nevi of right upper limb, including shoulder: Secondary | ICD-10-CM | POA: Diagnosis not present

## 2023-11-13 DIAGNOSIS — L218 Other seborrheic dermatitis: Secondary | ICD-10-CM | POA: Diagnosis not present

## 2023-11-13 DIAGNOSIS — D2271 Melanocytic nevi of right lower limb, including hip: Secondary | ICD-10-CM | POA: Diagnosis not present

## 2023-11-13 DIAGNOSIS — D2272 Melanocytic nevi of left lower limb, including hip: Secondary | ICD-10-CM | POA: Diagnosis not present

## 2023-11-13 DIAGNOSIS — D2262 Melanocytic nevi of left upper limb, including shoulder: Secondary | ICD-10-CM | POA: Diagnosis not present

## 2023-11-13 DIAGNOSIS — C44712 Basal cell carcinoma of skin of right lower limb, including hip: Secondary | ICD-10-CM | POA: Diagnosis not present

## 2023-11-13 DIAGNOSIS — L821 Other seborrheic keratosis: Secondary | ICD-10-CM | POA: Diagnosis not present

## 2023-11-14 ENCOUNTER — Ambulatory Visit: Payer: Medicare Other

## 2023-11-14 DIAGNOSIS — M6281 Muscle weakness (generalized): Secondary | ICD-10-CM

## 2023-11-14 DIAGNOSIS — R279 Unspecified lack of coordination: Secondary | ICD-10-CM | POA: Diagnosis not present

## 2023-11-14 DIAGNOSIS — R262 Difficulty in walking, not elsewhere classified: Secondary | ICD-10-CM | POA: Diagnosis not present

## 2023-11-14 DIAGNOSIS — R2681 Unsteadiness on feet: Secondary | ICD-10-CM

## 2023-11-14 DIAGNOSIS — R269 Unspecified abnormalities of gait and mobility: Secondary | ICD-10-CM | POA: Diagnosis not present

## 2023-11-14 DIAGNOSIS — R2689 Other abnormalities of gait and mobility: Secondary | ICD-10-CM | POA: Diagnosis not present

## 2023-11-14 NOTE — Therapy (Signed)
OUTPATIENT PHYSICAL THERAPY TREATMENT NOTE/RECERTIFICATION  Patient Name: Tom Underwood MRN: [id number] DOB:[birth year], 72 y.o., male Today's Date: 11/08/2023  PCP: Alba Cory, MD REFERRING PROVIDER: Caro Laroche, DO    PT End of Session - 11/14/23 0615     Visit Number [id number]    Number of Visits 70    Date for PT Re-Evaluation 01/03/24    PT Start Time 1025    PT Stop Time 1122    PT Time Calculation (min) 57 min               Past Medical History:  Diagnosis Date   Anxiety    Arthritis    Atrial fibrillation (HCC)    a.) CHA2DS2-VASc = 4 (age, HTN, CVA x2). b.) s/p 200 J synchronized cardioversion (DCCV) on 12/08/2021. c.) rate/rythum maintained on oral metoprolol tartrate; chronically anticoagulated using rivaroxaban.   BPH (benign prostatic hyperplasia)    DDD (degenerative disc disease), lumbar    Decreased libido    Depression    Diverticulitis    Diverticulosis    GERD (gastroesophageal reflux disease)    Hepatic steatosis    History of 2019 novel coronavirus disease (COVID-19) 12/28/2020   History of cardiac murmur in childhood    History of kidney stones    HLD (hyperlipidemia)    Hypertension    Hypogonadism in male    OSA on CPAP    Pre-diabetes    Rhinitis, allergic    Stroke (HCC) 07/23/2019   a.) LEFT posterior cor radiata. b.) residual RIGHT sided weakness   Past Surgical History:  Procedure Laterality Date   CARDIOVERSION N/A 12/08/2021   Procedure: CARDIOVERSION;  Surgeon: Yvonne Kendall, MD;  Location: [facility] ORS;  Service: Cardiovascular;  Laterality: N/A;   COLONOSCOPY     COLONOSCOPY WITH PROPOFOL N/A 10/06/2023   Procedure: COLONOSCOPY WITH PROPOFOL;  Surgeon: Wyline Mood, MD;  Location: [facility] ENDOSCOPY;  Service: Gastroenterology;  Laterality: N/A;   EVALUATION UNDER ANESTHESIA WITH HEMORRHOIDECTOMY N/A 04/15/2022   Procedure: EXAM UNDER ANESTHESIA WITH HEMORRHOIDECTOMY;  Surgeon: Sung Amabile, DO;  Location: [facility] ORS;   Service: General;  Laterality: N/A;   EXTERNAL EAR SURGERY     EYE SURGERY     FINGER SURGERY Right    lateration to 5 th digit   KNEE SURGERY     left eye     POLYPECTOMY  10/06/2023   Procedure: POLYPECTOMY;  Surgeon: Wyline Mood, MD;  Location: [facility] ENDOSCOPY;  Service: Gastroenterology;;   RECTAL EXAM UNDER ANESTHESIA N/A 04/23/2022   Procedure: RECTAL EXAM UNDER ANESTHESIA WITH LIGATION OF BLEEDING;  Surgeon: Henrene Dodge, MD;  Location: [facility] ORS;  Service: General;  Laterality: N/A;   Patient Active Problem List   Diagnosis Date Noted   Swelling of limb 10/31/2023   Colon cancer screening 10/06/2023   Adenomatous polyp of colon 10/06/2023   Gastroesophageal reflux disease without esophagitis 05/17/2022   Dysthymia 05/17/2022   History of syncope 05/17/2022   History of hemorrhoidectomy 05/17/2022   Vitamin D deficiency 05/17/2022   Lower extremity edema 05/17/2022   History of rectal bleeding 05/17/2022   Bleeding internal hemorrhoids    Morbid obesity (HCC)    Persistent atrial fibrillation (HCC) 07/14/2020   Hypertension    Hemiparesis of right dominant side as late effect of cerebral infarction (HCC) 08/13/2019   Stroke (HCC) 07/23/2019   History of kidney stones 05/02/2018   ED (erectile dysfunction) 05/02/2018   Hyperglycemia 01/30/2017   Arthritis of knee, degenerative 01/30/2017  Benign prostatic hyperplasia without lower urinary tract symptoms 02/03/2016   Dyslipidemia 08/04/2015   Diverticulitis of large intestine without perforation or abscess without bleeding 06/30/2015   OSA on CPAP 06/30/2015    REFERRING DIAG: M25.511,M25.512 (ICD-10-CM) - Bilateral shoulder pain, unspecified chronicity M17.2 (ICD-10-CM) - Post-traumatic osteoarthritis of both knees  R26.81 (ICD-10-CM) - Gait instability   THERAPY DIAG:  Muscle weakness (generalized)   Unsteadiness on feet   Abnormality of gait and mobility   Unspecified lack of coordination   Other  abnormalities of gait and mobility   Difficulty in walking, not elsewhere classified  PERTINENT HISTORY: See evaluation  PRECAUTIONS: Fall  10/30:  : 460 feet; vitals post : 96% O2, 58 bpm, 132/49 sitting BP  TUG: 22.05 seconds with SPC  DGI: 15/24  SUBJECTIVE:  11/08/2023    Pt reports continued swelling in B LE and c/o heaviness in his legs.  Pt. Had f/u with PCP and referred to vascular MD (scheduled 12/15/23).  Pt. Has order for lower extremity compression stockings.  Pt. Instructed on the purpose/benefit of compression stockings to assist with distal LE swelling.  Pt. Reports 3/10 B knee pain and reports no falls/LOB.     TODAY'S TREATMENT:              There ex.:             Sitting BP at start of session: 127/57, 94% O2, 49 bpm  Nustep L6 10 min. B UE/LE.  Seat position #10/ arm position #10.  0.61  miles.  Increase cadence/no increase c/o knee pain   Standing marching/ hip abduction in //-bars 20x.  Walking in //-bars with focus on consistent recip. Hip flexion/ heel strike.  Added use of hurdles and cuing to prevent hip circumduction.    Airex step ups/overs in //-bars.    5xSTS: 11.2 sec. (No UE assist)- requires rest break between sets.    LEFS: 22 out of 80  LOWER EXTREMITY MMT:    MMT Right eval Left eval  Hip flexion 4 5  Hip extension    Hip abduction 4 4  Hip adduction 4 4  Hip internal rotation    Hip external rotation    Knee flexion 5 5   Knee extension 5 5  Ankle dorsiflexion 5 5  Ankle plantarflexion    Ankle inversion    Ankle eversion     (Blank rows = not tested)  TRX squats 22x2, Chair behind patient for cue on squat depth.              Seated marching/ LAQ/ heel raises 20x.   PT reassessed B lower leg swelling during seated rest breaks.     Not today: Standing Nautilus: 70# lat. Pull downs with added shoulder flexion holds 22x2, 70# Tricep pushdowns 2x6 Standing 6-inch step taps, UE support as needed: 2x20 taps Resisted  Gait with Black TB: 3x each direction - 7/10 exertion scale; pt reported mild increase in R hip pain when side-stepping to the left  Walking High Knees in // bars: 3 laps with no UE support Seated B shoulder flexion/ abduction 20x.  Seated LAQ/marching/ heel raises.  Assessment of B lower leg/ ankle swelling and use of compression socks.   Walking in clinic/ hallway/ out to car with use of SPC and moderate cuing to correct upright posture/consistent step pattern.  Walking through grass/mulch/sidewalk and up/down curb.      PATIENT EDUCATION: Education details: HEP/ daily activity Person educated: Patient Education  method: Explanation and Demonstration Education comprehension: verbalized understanding and returned demonstration   HOME EXERCISE PROGRAM: Handouts given for shoulder ROM/ strengthening/ standing hip ex.    PT Short Term Goals -  12/06/23      PT SHORT TERM GOAL #1   Title Pt able to complete and ambulate >800 feet with no assistive device safely.     Baseline See above   Time 4    Period Weeks    Status Partially met          PT Long Term Goals - 01/03/24      PT LONG TERM GOAL #1   Title Pt. Able to complete 10 steps with recip. Pattern and 1 handrail assist with mod. Independence safely.    Baseline Pt. Limited with R hip flexion/ recip. Gait pattern and requires heavy UE assist.  Pt. Has ramp at home with stairs at side   Time 8   Period Weeks    Status Partially met   Target Date 01/03/24     PT LONG TERM GOAL #2   Title Pt wil improve TUG score to < 12 sec with no AD to display significant imporvement in reduced risk of falls    Baseline See above   Time 8   Period Weeks    Status Goal met   Target Date 11/08/2023     PT LONG TERM GOAL #3   Title Pt will improve DGI to >19/24 to indicate decreased risk of falls with household and community walking tasks    Baseline 11/23: 11/24 1/19:12/24 8/16: 14/24, 10/30: 15/24   Time 8    Period Weeks     Status Partially Met    Target Date 01/03/2024     PT LONG TERM GOAL #4   Title Pt. Will increase R hip flexion/ LE muscle strength 1/2 muscle grade to improve standing tolerance/ walking endurance.    Baseline See above.  B hip flexion 4+/5 MMT (good control on R hip today), 10/30: see for updated scores   Time 8    Period Weeks    Status Partially Met    Target Date 01/03/2024         Plan -     Clinical Impression Statement Pt reports to PT with continued feelings of "heaviness" in his LE's. Goal reassessment performed today. Pt continues to demonstrate limitations in his LE and overall cardiovascular endurance, as indicated by his distance. Pt is also at increased risk for falls based on his TUG time. Pt demonstrated improvements in LE MMT scores compared to last reassessment. Pt educated on importance of home carryover and consistency with HEP. Pt also educated on possibility of joining a gym at the end of his plan of care to promote continued improvement in LE strength and cardiovascular endurance. Patient will continue to benefit from skilled PT services to progress strength and mobility, along with cardiovascular endurance.     Personal Factors and Comorbidities Comorbidity 2    Comorbidities Hypertension, Obesity    Examination-Activity Limitations Bed Mobility;Reach Overhead;Squat;Lift;Dressing;Hygiene/Grooming    Examination-Participation Restrictions Community Activity;Yard Work    Conservation officer, historic buildings Evolving/Moderate complexity    Clinical Decision Making Moderate    Rehab Potential Fair    PT Frequency 1x / week    PT Duration 8 weeks    PT Treatment/Interventions ADLs/Self Care Home Management;Gait training;Stair training;Functional mobility training;Therapeutic activities;Therapeutic exercise;Balance training;Neuromuscular re-education;Manual techniques;Patient/family education;Electrical Stimulation;Moist Heat;Cryotherapy    PT POC CHECK BP  PT Home  Exercise Plan Progress endurance training to improve functional activity, follow-up on RPE scale (6-7/10), ISSUE NEW HEP   Consulted and Agree with Plan of Care Patient         Cammie Mcgee, PT, DPT # [phone] Physical Therapist - [facility]  11/14/23, 6:18 AM

## 2023-11-14 NOTE — Therapy (Signed)
OUTPATIENT PHYSICAL THERAPY TREATMENT NOTE/RECERTIFICATION  Patient Name: Tom Underwood MRN: [id number] DOB:1951/09/12, 72 y.o., male Today's Date: 11/08/2023  PCP: Alba Cory, MD REFERRING PROVIDER: Caro Laroche, DO    PT End of Session - 11/14/23 1926     Visit Number 63    Number of Visits 70    Date for PT Re-Evaluation 01/03/24    PT Start Time 1345    PT Stop Time 1430    PT Time Calculation (min) 45 min    Equipment Utilized During Treatment Gait belt    Activity Tolerance Patient limited by fatigue    Behavior During Therapy WFL for tasks assessed/performed                Past Medical History:  Diagnosis Date   Anxiety    Arthritis    Atrial fibrillation (HCC)    a.) CHA2DS2-VASc = 4 (age, HTN, CVA x2). b.) s/p 200 J synchronized cardioversion (DCCV) on 12/08/2021. c.) rate/rythum maintained on oral metoprolol tartrate; chronically anticoagulated using rivaroxaban.   BPH (benign prostatic hyperplasia)    DDD (degenerative disc disease), lumbar    Decreased libido    Depression    Diverticulitis    Diverticulosis    GERD (gastroesophageal reflux disease)    Hepatic steatosis    History of 2019 novel coronavirus disease (COVID-19) 12/28/2020   History of cardiac murmur in childhood    History of kidney stones    HLD (hyperlipidemia)    Hypertension    Hypogonadism in male    OSA on CPAP    Pre-diabetes    Rhinitis, allergic    Stroke (HCC) 07/23/2019   a.) LEFT posterior cor radiata. b.) residual RIGHT sided weakness   Past Surgical History:  Procedure Laterality Date   CARDIOVERSION N/A 12/08/2021   Procedure: CARDIOVERSION;  Surgeon: Yvonne Kendall, MD;  Location: [facility] ORS;  Service: Cardiovascular;  Laterality: N/A;   COLONOSCOPY     COLONOSCOPY WITH PROPOFOL N/A 10/06/2023   Procedure: COLONOSCOPY WITH PROPOFOL;  Surgeon: Wyline Mood, MD;  Location: [facility] END[facility]COPY;  Service: Gastroenterology;  Laterality: N/A;   EVALUATION  UNDER ANESTHESIA WITH HEMORRHOIDECTOMY N/A 04/15/2022   Procedure: EXAM UNDER ANESTHESIA WITH HEMORRHOIDECTOMY;  Surgeon: Sung Amabile, DO;  Location: [facility] ORS;  Service: General;  Laterality: N/A;   EXTERNAL EAR SURGERY     EYE SURGERY     FINGER SURGERY Right    lateration to 5 th digit   KNEE SURGERY     left eye     POLYPECTOMY  10/06/2023   Procedure: POLYPECTOMY;  Surgeon: Wyline Mood, MD;  Location: [facility];  Service: Gastroenterology;;   RECTAL EXAM UNDER ANESTHESIA N/A 04/23/2022   Procedure: RECTAL EXAM UNDER ANESTHESIA WITH LIGATION OF BLEEDING;  Surgeon: Henrene Dodge, MD;  Location: [facility] ORS;  Service: General;  Laterality: N/A;   Patient Active Problem List   Diagnosis Date Noted   Swelling of limb 10/31/2023   Colon cancer screening 10/06/2023   Adenomatous polyp of colon 10/06/2023   Gastroesophageal reflux disease without esophagitis 05/17/2022   Dysthymia 05/17/2022   History of syncope 05/17/2022   History of hemorrhoidectomy 05/17/2022   Vitamin D deficiency 05/17/2022   Lower extremity edema 05/17/2022   History of rectal bleeding 05/17/2022   Bleeding internal hemorrhoids    Morbid obesity (HCC)    Persistent atrial fibrillation (HCC) 07/14/2020   Hypertension    Hemiparesis of right dominant side as late effect of cerebral infarction (HCC) 08/13/2019  Stroke (HCC) 07/23/2019   History of kidney stones 05/02/2018   ED (erectile dysfunction) 05/02/2018   Hyperglycemia 01/30/2017   Arthritis of knee, degenerative 01/30/2017   Benign prostatic hyperplasia without lower urinary tract symptoms 02/03/2016   Dyslipidemia 08/04/2015   Diverticulitis of large intestine without perforation or abscess without bleeding 06/30/2015   OSA on CPAP 06/30/2015    REFERRING DIAG: M25.511,M25.512 (ICD-10-CM) - Bilateral shoulder pain, unspecified chronicity M17.2 (ICD-10-CM) - Post-traumatic osteoarthritis of both knees  R26.81 (ICD-10-CM) - Gait instability    THERAPY DIAG:  Muscle weakness (generalized)   Unsteadiness on feet   Abnormality of gait and mobility   Unspecified lack of coordination   Other abnormalities of gait and mobility   Difficulty in walking, not elsewhere classified  PERTINENT HISTORY: See evaluation  PRECAUTIONS: Fall  10/30:  : 460 feet; vitals post : 96% O2, 58 bpm, 132/49 sitting BP  TUG: 22.05 seconds with SPC  DGI: 15/24  SUBJECTIVE:  11/08/2023    Pt reports continued swelling but les then before in B LE and presents with new pressure socks..  Pt. Had f/u with PCP and referred to vascular MD (scheduled 12/15/23).  Pt. Has order for lower extremity compression stockings.  Pt. Instructed on the purpose/benefit of compression stockings to assist with distal LE swelling.  Pt. Reports 5=-6 /10 RPE with mild exertion.     TODAY'S TREATMENT:              There ex.:             Sitting BP at start of session: 154/61, 97% O2, 61 bpm after 5 mins of Sci-fit 127/53 HR 53            Nustep L5 for 5 mins and at L3 for 5 mins. B UE/LE.  Seat position #10/ arm position #10.  0.61  miles.  Increase cadence/no increase c/o knee pain  Standing marching/ hip abduction in //-bars 20x. Step up on stairs 2 x 10 reps             Walking in //-bars with focus on consistent recip. Hip flexion/ heel strike.  Added use of hurdles and cuing to prevent hip circumduction.    Forward and sideways Airex step ups/overs in //-bars.    Pt required ample rest breaks due to fatigue.   LOWER EXTREMITY MMT:    MMT Right eval Left eval  Hip flexion 4 5  Hip extension    Hip abduction 4 4  Hip adduction 4 4  Hip internal rotation    Hip external rotation    Knee flexion 5 5   Knee extension 5 5  Ankle dorsiflexion 5 5  Ankle plantarflexion    Ankle inversion    Ankle eversion     (Blank rows = not tested)  TRX squats 22x2, Chair behind patient for cue on squat depth.              Seated marching/ LAQ/ heel raises  20x.   PT reassessed B lower leg swelling during seated rest breaks.     Not today: Standing Nautilus: 70# lat. Pull downs with added shoulder flexion holds 22x2, 70# Tricep pushdowns 2x6 Standing 6-inch step taps, UE support as needed: 2x20 taps Resisted Gait with Black TB: 3x each direction - 7/10 exertion scale; pt reported mild increase in R hip pain when side-stepping to the left  Walking High Knees in // bars: 3 laps with no UE support Seated B  shoulder flexion/ abduction 20x.  Seated LAQ/marching/ heel raises.  Assessment of B lower leg/ ankle swelling and use of compression socks.   Walking in clinic/ hallway/ out to car with use of SPC and moderate cuing to correct upright posture/consistent step pattern.  Walking through grass/mulch/sidewalk and up/down curb.      PATIENT EDUCATION: Education details: HEP/ daily activity Person educated: Patient Education method: Medical illustrator Education comprehension: verbalized understanding and returned demonstration   HOME EXERCISE PROGRAM: Handouts given for shoulder ROM/ strengthening/ standing hip ex.    PT Short Term Goals -  12/06/23      PT SHORT TERM GOAL #1   Title Pt able to complete and ambulate >800 feet with no assistive device safely.     Baseline See above   Time 4    Period Weeks    Status Partially met          PT Long Term Goals - 01/03/24      PT LONG TERM GOAL #1   Title Pt. Able to complete 10 steps with recip. Pattern and 1 handrail assist with mod. Independence safely.    Baseline Pt. Limited with R hip flexion/ recip. Gait pattern and requires heavy UE assist.  Pt. Has ramp at home with stairs at side   Time 8   Period Weeks    Status Partially met   Target Date 01/03/24     PT LONG TERM GOAL #2   Title Pt wil improve TUG score to < 12 sec with no AD to display significant imporvement in reduced risk of falls    Baseline See above   Time 8   Period Weeks    Status Goal met    Target Date 11/08/2023     PT LONG TERM GOAL #3   Title Pt will improve DGI to >19/24 to indicate decreased risk of falls with household and community walking tasks    Baseline 11/23: 11/24 1/19:12/24 8/16: 14/24, 10/30: 15/24   Time 8    Period Weeks    Status Partially Met    Target Date 01/03/2024     PT LONG TERM GOAL #4   Title Pt. Will increase R hip flexion/ LE muscle strength 1/2 muscle grade to improve standing tolerance/ walking endurance.    Baseline See above.  B hip flexion 4+/5 MMT (good control on R hip today), 10/30: see for updated scores   Time 8    Period Weeks    Status Partially Met    Target Date 01/03/2024         Plan -     Clinical Impression Statement Pt reports to PT with continued feelings of "heaviness" in his LE's but decrease in edema in BLE. PT discussed goal reassessment again. Pt's BP and HR dropped with Exertion while RPE remained very high. Pt agreed to return once in two weeks and then once a month for 2 months while becoming Independent with HEP for long term benefit of maintaining current status with continued progress over long time. Pt education about RPE scale to prevent fatigue and adverese affect on Vitals.  Patient will continue to benefit from skilled PT services to progress strength and mobility, along with cardiovascular endurance with HEP and reducing frequencies in next two months as mentioned above.      Personal Factors and Comorbidities Comorbidity 2    Comorbidities Hypertension, Obesity    Examination-Activity Limitations Bed Mobility;Reach Overhead;Squat;Lift;Dressing;Hygiene/Grooming    Examination-Participation Restrictions Community  Activity;Yard Work    Conservation officer, historic buildings Evolving/Moderate complexity    Clinical Decision Making Moderate    Rehab Potential Fair    PT Frequency 1x / week    PT Duration 8 weeks    PT Treatment/Interventions ADLs/Self Care Home Management;Gait training;Stair training;Functional  mobility training;Therapeutic activities;Therapeutic exercise;Balance training;Neuromuscular re-education;Manual techniques;Patient/family education;Electrical Stimulation;Moist Heat;Cryotherapy    PT POC CHECK BP   PT Home Exercise Plan Progress endurance training to improve functional activity, follow-up on RPE scale (6-7/10), ISSUE NEW HEP   Consulted and Agree with Plan of Care Patient         Janet Berlin PT DPT 7:28 PM,11/14/23

## 2023-11-16 DIAGNOSIS — H40003 Preglaucoma, unspecified, bilateral: Secondary | ICD-10-CM | POA: Diagnosis not present

## 2023-11-17 ENCOUNTER — Encounter: Payer: Medicare Other | Admitting: Professional

## 2023-11-17 NOTE — Progress Notes (Signed)
This encounter was created in error - please disregard. ? ? ? ? ? ? ? ? ? ? ? ? ? ? ?Oma Marzan, LCMHC ?

## 2023-11-20 ENCOUNTER — Encounter: Payer: Self-pay | Admitting: Professional

## 2023-11-20 ENCOUNTER — Encounter (INDEPENDENT_AMBULATORY_CARE_PROVIDER_SITE_OTHER): Payer: Medicare Other | Admitting: Nurse Practitioner

## 2023-11-20 ENCOUNTER — Other Ambulatory Visit: Payer: Self-pay

## 2023-11-20 ENCOUNTER — Ambulatory Visit: Payer: Medicare Other | Admitting: Professional

## 2023-11-20 DIAGNOSIS — F320 Major depressive disorder, single episode, mild: Secondary | ICD-10-CM | POA: Diagnosis not present

## 2023-11-20 DIAGNOSIS — E785 Hyperlipidemia, unspecified: Secondary | ICD-10-CM

## 2023-11-20 DIAGNOSIS — Z634 Disappearance and death of family member: Secondary | ICD-10-CM | POA: Diagnosis not present

## 2023-11-20 MED ORDER — ROSUVASTATIN CALCIUM 20 MG PO TABS: 20.0000 mg | ORAL_TABLET | Freq: Every day | ORAL | 3 refills | Status: DC

## 2023-11-20 MED ORDER — METOPROLOL TARTRATE 25 MG PO TABS: 25.0000 mg | ORAL_TABLET | Freq: Two times a day (BID) | ORAL | 3 refills | Status: DC

## 2023-11-20 NOTE — Progress Notes (Signed)
° ° ° ° ° ° ° ° ° ° ° ° ° ° °  Araya Roel, LCMHC °
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
## 2023-11-21 ENCOUNTER — Other Ambulatory Visit: Payer: Self-pay | Admitting: Family Medicine

## 2023-11-21 DIAGNOSIS — N400 Enlarged prostate without lower urinary tract symptoms: Secondary | ICD-10-CM

## 2023-11-21 DIAGNOSIS — E785 Hyperlipidemia, unspecified: Secondary | ICD-10-CM

## 2023-11-22 ENCOUNTER — Encounter: Payer: Self-pay | Admitting: Family Medicine

## 2023-11-22 ENCOUNTER — Ambulatory Visit: Payer: Medicare Other | Admitting: Family Medicine

## 2023-11-22 VITALS — BP 130/76 | HR 76 | Resp 16 | Ht 70.0 in | Wt 304.2 lb

## 2023-11-22 DIAGNOSIS — K219 Gastro-esophageal reflux disease without esophagitis: Secondary | ICD-10-CM

## 2023-11-22 DIAGNOSIS — G4733 Obstructive sleep apnea (adult) (pediatric): Secondary | ICD-10-CM

## 2023-11-22 DIAGNOSIS — R600 Localized edema: Secondary | ICD-10-CM

## 2023-11-22 DIAGNOSIS — I69351 Hemiplegia and hemiparesis following cerebral infarction affecting right dominant side: Secondary | ICD-10-CM

## 2023-11-22 DIAGNOSIS — Z8679 Personal history of other diseases of the circulatory system: Secondary | ICD-10-CM

## 2023-11-22 DIAGNOSIS — E785 Hyperlipidemia, unspecified: Secondary | ICD-10-CM

## 2023-11-22 DIAGNOSIS — F339 Major depressive disorder, recurrent, unspecified: Secondary | ICD-10-CM | POA: Diagnosis not present

## 2023-11-22 DIAGNOSIS — N401 Enlarged prostate with lower urinary tract symptoms: Secondary | ICD-10-CM

## 2023-11-22 DIAGNOSIS — F419 Anxiety disorder, unspecified: Secondary | ICD-10-CM | POA: Diagnosis not present

## 2023-11-22 DIAGNOSIS — M170 Bilateral primary osteoarthritis of knee: Secondary | ICD-10-CM

## 2023-11-22 DIAGNOSIS — Z23 Encounter for immunization: Secondary | ICD-10-CM

## 2023-11-22 MED ORDER — BUPROPION HCL ER (XL) 300 MG PO TB24: 300.0000 mg | ORAL_TABLET | Freq: Every morning | ORAL | 1 refills | Status: DC

## 2023-11-22 MED ORDER — PANTOPRAZOLE SODIUM 20 MG PO TBEC: 20.0000 mg | DELAYED_RELEASE_TABLET | Freq: Every day | ORAL | 1 refills | Status: DC

## 2023-11-22 MED ORDER — BUSPIRONE HCL 10 MG PO TABS: 10.0000 mg | ORAL_TABLET | Freq: Two times a day (BID) | ORAL | 1 refills | Status: DC

## 2023-12-04 ENCOUNTER — Encounter: Payer: Medicare Other | Admitting: Physical Therapy

## 2023-12-12 ENCOUNTER — Encounter: Payer: Medicare Other | Admitting: Physical Therapy

## 2023-12-13 ENCOUNTER — Telehealth: Payer: Self-pay | Admitting: Cardiology

## 2023-12-13 DIAGNOSIS — E785 Hyperlipidemia, unspecified: Secondary | ICD-10-CM

## 2023-12-13 MED ORDER — ROSUVASTATIN CALCIUM 20 MG PO TABS: 20.0000 mg | ORAL_TABLET | Freq: Every day | ORAL | 3 refills | Status: AC

## 2023-12-13 MED ORDER — METOPROLOL TARTRATE 25 MG PO TABS: 25.0000 mg | ORAL_TABLET | Freq: Two times a day (BID) | ORAL | 3 refills | Status: AC

## 2023-12-13 NOTE — Telephone Encounter (Signed)
 Requested Prescriptions   Signed Prescriptions Disp Refills   metoprolol  tartrate (LOPRESSOR ) 25 MG tablet 180 tablet 3    Sig: Take 1 tablet (25 mg total) by mouth 2 (two) times daily.    Authorizing Provider: DARLISS ROGUE    Ordering User: Adalynne Steffensmeier  C   rosuvastatin  (CRESTOR ) 20 MG tablet 90 tablet 3    Sig: Take 1 tablet (20 mg total) by mouth daily.    Authorizing Provider: DARLISS ROGUE    Ordering User: Lanayah Gartley  C

## 2023-12-13 NOTE — Telephone Encounter (Signed)
*  STAT* If patient is at the pharmacy, call can be transferred to refill team.   1. Which medications need to be refilled? (please list name of each medication and dose if known) metoprolol  tartrate (LOPRESSOR ) 25 MG tablet  and rosuvastatin  (CRESTOR ) 20 MG tablet    2. Would you like to learn more about the convenience, safety, & potential cost savings by using the [facility]?      3. Are you open to using the [facility] (Type [facility]).   4. Which pharmacy/location (including street and city if local pharmacy) is medication to be sent to? [facility]  Phone Number [phone]  fax: [phone] Member# [id number]    5. Do they need a 30 day or 90 day supply? 90

## 2023-12-15 ENCOUNTER — Encounter (INDEPENDENT_AMBULATORY_CARE_PROVIDER_SITE_OTHER): Payer: Medicare Other

## 2023-12-15 ENCOUNTER — Ambulatory Visit (INDEPENDENT_AMBULATORY_CARE_PROVIDER_SITE_OTHER): Payer: PRIVATE HEALTH INSURANCE | Admitting: Vascular Surgery

## 2023-12-20 ENCOUNTER — Ambulatory Visit: Payer: Medicare Other | Attending: Family Medicine | Admitting: Physical Therapy

## 2023-12-20 ENCOUNTER — Encounter: Payer: Self-pay | Admitting: Professional

## 2023-12-20 ENCOUNTER — Encounter: Payer: Self-pay | Admitting: Physical Therapy

## 2023-12-20 ENCOUNTER — Ambulatory Visit: Payer: Medicare Other | Admitting: Professional

## 2023-12-20 DIAGNOSIS — F320 Major depressive disorder, single episode, mild: Secondary | ICD-10-CM | POA: Diagnosis not present

## 2023-12-20 DIAGNOSIS — M6281 Muscle weakness (generalized): Secondary | ICD-10-CM | POA: Insufficient documentation

## 2023-12-20 DIAGNOSIS — R2681 Unsteadiness on feet: Secondary | ICD-10-CM | POA: Insufficient documentation

## 2023-12-20 DIAGNOSIS — R269 Unspecified abnormalities of gait and mobility: Secondary | ICD-10-CM | POA: Insufficient documentation

## 2023-12-20 DIAGNOSIS — R262 Difficulty in walking, not elsewhere classified: Secondary | ICD-10-CM | POA: Insufficient documentation

## 2023-12-20 DIAGNOSIS — F411 Generalized anxiety disorder: Secondary | ICD-10-CM | POA: Diagnosis not present

## 2023-12-20 DIAGNOSIS — Z634 Disappearance and death of family member: Secondary | ICD-10-CM

## 2023-12-20 DIAGNOSIS — M25511 Pain in right shoulder: Secondary | ICD-10-CM | POA: Insufficient documentation

## 2023-12-20 DIAGNOSIS — R279 Unspecified lack of coordination: Secondary | ICD-10-CM | POA: Insufficient documentation

## 2023-12-20 DIAGNOSIS — G8929 Other chronic pain: Secondary | ICD-10-CM | POA: Diagnosis not present

## 2023-12-20 NOTE — Therapy (Signed)
 OUTPATIENT PHYSICAL THERAPY TREATMENT NOTE  Patient Name: Tom Underwood MRN: [id number] DOB:04/04/1951, 73 y.o., male Today's Date: 12/20/2023  PCP: Arleen Lacer, MD REFERRING PROVIDER: Kandis Ormond, DO    PT End of Session - 12/20/23 1319     Visit Number 64    Number of Visits 70    Date for PT Re-Evaluation 01/03/24    PT Start Time 1304    PT Stop Time 1413    PT Time Calculation (min) 69 min    Equipment Utilized During Treatment Gait belt    Activity Tolerance Patient limited by fatigue    Behavior During Therapy WFL for tasks assessed/performed              Past Medical History:  Diagnosis Date   Anxiety    Arthritis    Atrial fibrillation (HCC)    a.) CHA2DS2-VASc = 4 (age, HTN, CVA x2). b.) s/p 200 J synchronized cardioversion (DCCV) on 12/08/2021. c.) rate/rythum maintained on oral metoprolol  tartrate; chronically anticoagulated using rivaroxaban .   BPH (benign prostatic hyperplasia)    DDD (degenerative disc disease), lumbar    Decreased libido    Depression    Diverticulitis    Diverticulosis    GERD (gastroesophageal reflux disease)    Hepatic steatosis    History of 2019 novel coronavirus disease (COVID-19) 12/28/2020   History of cardiac murmur in childhood    History of kidney stones    HLD (hyperlipidemia)    Hypertension    Hypogonadism in male    OSA on CPAP    Pre-diabetes    Rhinitis, allergic    Stroke (HCC) 07/23/2019   a.) LEFT posterior cor radiata. b.) residual RIGHT sided weakness   Past Surgical History:  Procedure Laterality Date   CARDIOVERSION N/A 12/08/2021   Procedure: CARDIOVERSION;  Surgeon: Sammy Crisp, MD;  Location: [facility] ORS;  Service: Cardiovascular;  Laterality: N/A;   COLONOSCOPY     COLONOSCOPY WITH PROPOFOL  N/A 10/06/2023   Procedure: COLONOSCOPY WITH PROPOFOL ;  Surgeon: Luke Salaam, MD;  Location: [facility] ENDOSCOPY;  Service: Gastroenterology;  Laterality: N/A;   EVALUATION UNDER ANESTHESIA WITH  HEMORRHOIDECTOMY N/A 04/15/2022   Procedure: EXAM UNDER ANESTHESIA WITH HEMORRHOIDECTOMY;  Surgeon: Conrado Delay, DO;  Location: [facility] ORS;  Service: General;  Laterality: N/A;   EXTERNAL EAR SURGERY     EYE SURGERY     FINGER SURGERY Right    lateration to 5 th digit   KNEE SURGERY     left eye     POLYPECTOMY  10/06/2023   Procedure: POLYPECTOMY;  Surgeon: Luke Salaam, MD;  Location: [facility] ENDOSCOPY;  Service: Gastroenterology;;   RECTAL EXAM UNDER ANESTHESIA N/A 04/23/2022   Procedure: RECTAL EXAM UNDER ANESTHESIA WITH LIGATION OF BLEEDING;  Surgeon: Emmalene Hare, MD;  Location: [facility] ORS;  Service: General;  Laterality: N/A;   Patient Active Problem List   Diagnosis Date Noted   History of atrial fibrillation 11/22/2023   Major depression, recurrent, chronic (HCC) 11/22/2023   Adenomatous polyp of colon 10/06/2023   Gastroesophageal reflux disease without esophagitis 05/17/2022   History of syncope 05/17/2022   History of hemorrhoidectomy 05/17/2022   Vitamin D  deficiency 05/17/2022   Lower extremity edema 05/17/2022   History of rectal bleeding 05/17/2022   Morbid obesity (HCC)    Hypertension    Hemiparesis of right dominant side as late effect of cerebral infarction (HCC) 08/13/2019   History of kidney stones 05/02/2018   ED (erectile dysfunction) 05/02/2018   Hyperglycemia 01/30/2017  Arthritis of knee, degenerative 01/30/2017   Benign prostatic hyperplasia without lower urinary tract symptoms 02/03/2016   Dyslipidemia 08/04/2015   OSA on CPAP 06/30/2015    REFERRING DIAG: M25.511,M25.512 (ICD-10-CM) - Bilateral shoulder pain, unspecified chronicity M17.2 (ICD-10-CM) - Post-traumatic osteoarthritis of both knees  R26.81 (ICD-10-CM) - Gait instability   THERAPY DIAG:  Muscle weakness (generalized)   Unsteadiness on feet   Abnormality of gait and mobility   Unspecified lack of coordination   Other abnormalities of gait and mobility   Difficulty in walking, not  elsewhere classified  PERTINENT HISTORY: See evaluation  PRECAUTIONS: Fall  10/30:  : 460 feet; vitals post : 96% O2, 58 bpm, 132/49 sitting BP  TUG: 22.05 seconds with SPC  DGI: 15/24  SUBJECTIVE:  12/20/2023  Pt. Entered PT with B lower leg compression stockings.  Pt. Reports benefit from stockings with daily tasks.  Pt. Reports 3/10 B knee discomfort with standing and walking activities.  Pt. Has not been seen by PT over past month secondary pt. Out of town and holidays.      TODAY'S TREATMENT:                 Sitting BP at start of session: 157/70, 97% O2, 57 bpm    There ex.:               Nustep L6 for 10 mins. B UE/LE.  Seat position #10/ arm position #10.  0.53  miles.      Nautilus: standing lat. Pull downs 60# 22x/ seated lat. Pull downs 60# 22x.     TRX squats 22x2, Chair behind patient for cue on squat depth.    PT reviewed pts. Daily activity/ HEP/ use of compression stockings.  Several seated rest breaks during there.ex. secondary to fatigue/ SOB.  PT monitored pts. Vitals t/o tx. Session.     Standing marching/ hip abduction in //-bars 20x.              Walking in //-bars with focus on consistent recip. Hip flexion/ heel strike.  Added use of hurdles and cuing to prevent hip circumduction.     PATIENT EDUCATION: Education details: HEP/ daily activity Person educated: Patient Education method: Medical illustrator Education comprehension: verbalized understanding and returned demonstration  HOME EXERCISE PROGRAM: Handouts given for shoulder ROM/ strengthening/ standing hip ex.    PT Short Term Goals -  12/06/23      PT SHORT TERM GOAL #1   Title Pt able to complete and ambulate >800 feet with no assistive device safely.     Baseline See above   Time 4    Period Weeks    Status Partially met          PT Long Term Goals - 01/03/24      PT LONG TERM GOAL #1   Title Pt. Able to complete 10 steps with recip. Pattern and 1 handrail  assist with mod. Independence safely.    Baseline Pt. Limited with R hip flexion/ recip. Gait pattern and requires heavy UE assist.  Pt. Has ramp at home with stairs at side   Time 8   Period Weeks    Status Partially met   Target Date 01/03/24     PT LONG TERM GOAL #2   Title Pt wil improve TUG score to < 12 sec with no AD to display significant imporvement in reduced risk of falls    Baseline See above   Time 8  Period Weeks    Status Goal met   Target Date 11/08/2023     PT LONG TERM GOAL #3   Title Pt will improve DGI to >19/24 to indicate decreased risk of falls with household and community walking tasks    Baseline 11/23: 11/24 1/19:12/24 8/16: 14/24, 10/30: 15/24   Time 8    Period Weeks    Status Partially Met    Target Date 01/03/2024     PT LONG TERM GOAL #4   Title Pt. Will increase R hip flexion/ LE muscle strength 1/2 muscle grade to improve standing tolerance/ walking endurance.    Baseline See above.  B hip flexion 4+/5 MMT (good control on R hip today), 10/30: see for updated scores   Time 8    Period Weeks    Status Partially Met    Target Date 01/03/2024         Plan -     Clinical Impression Statement Pt. Arrived to PT with B lower leg compression stockings and use of SPC/ step to gait pattern.  B knee discomfort with standing/ walking tasks but pain remained <5/10 t/o tx. Session.  PT tx. Focused on generalized strengthening/ endurance ex.  Pt education about RPE scale to prevent fatigue and adverese affect on Vitals.  Patient will continue to benefit from skilled PT services to progress strength and mobility, along with cardiovascular endurance with HEP and reducing frequencies in next two months as mentioned above.      Personal Factors and Comorbidities Comorbidity 2    Comorbidities Hypertension, Obesity    Examination-Activity Limitations Bed Mobility;Reach Overhead;Squat;Lift;Dressing;Hygiene/Grooming    Examination-Participation Restrictions Community  Activity;Yard Work    Conservation officer, historic buildings Evolving/Moderate complexity    Clinical Decision Making Moderate    Rehab Potential Fair    PT Frequency 1x / week    PT Duration 8 weeks    PT Treatment/Interventions ADLs/Self Care Home Management;Gait training;Stair training;Functional mobility training;Therapeutic activities;Therapeutic exercise;Balance training;Neuromuscular re-education;Manual techniques;Patient/family education;Electrical Stimulation;Moist Heat;Cryotherapy    PT POC CHECK BP   PT Home Exercise Plan Progress endurance training to improve functional activity, follow-up on RPE scale (6-7/10).   Consulted and Agree with Plan of Care Patient         Lendell Quarry, PT, DPT # [phone] 2:34 PM,12/20/23

## 2023-12-20 NOTE — Progress Notes (Addendum)
[facility] Counselor/Therapist Progress Note  Patient ID: Tom Underwood, MRN: [id number],    Date: 12/26/2023  Time Spent: 54 minutes 1003-1057am   Treatment Type: Individual Therapy  Risk Assessment: Danger to Self:  No Self-injurious Behavior: No Danger to Others: No Duty to Warn:no  Subjective: This session was held via video teletherapy. The patient consented to video teletherapy and was located at his home during this session. He is aware it is the responsibility of the patient to secure confidentiality on her end of the session. The provider was in a private home office for the duration of this session.    The patient arrived on time for his Caregility appointment.   Issues addressed: 1-beach trip -never got on interstate -[place] [address], [place] [address] and [place] [address] -they had a nice Christmas -the first time in their lives together that they woke up on Christmas Day at the beach -he enjoyed it and it was fun to say that he and [name] woke up at [employer] -he could not walk on beach due to health -they were homesick to be with their kitties so they came home early -his daughter's family showed up and he really enjoys have Shae 16 and Jean Rosenthal 11 pay a lot of attention to him 2-mood -pt feels less depressed -his wife would say that he sleeps too much -most days if he has no appointments he gets up at 10 but is up until 1-2am -he admits that he sleeps because he is bored and he doesn't have a lot of activities 3-marital -[name] would like for him to go out with her to go to lunch or on an outing 4-stroke 5 years ago -pt admits that seeing the stoke victims at [facility] who could not walk was difficult -he did not like walking by their rooms knowing they could not walk -he was at [facility] for eight days -he knows his legs are now stronger than they were -he was able to physically do more with his legs -he gets the gel shots in knees that reduce pain but  he cannot walk as far 5-worries about urinary urgency -has worn a pull-up 6-everything bad happens in August -his stroke -his accident -a medical emergency  Treatment Plan Problems: Anxiety, Unipolar Depression, Posttraumatic Stress Disorder (PTSD), Grief / Loss Unresolved,  Symptoms: Excessive and/or unrealistic worry that is difficult to control occurring more days than not for at least 6 months about a number of events or activities. Motor tension (e.g., restlessness, tiredness, shakiness, muscle tension). Hypervigilance (e.g., feeling constantly on edge, experiencing concentration difficulties, having trouble falling or staying asleep, exhibiting a general state of irritability). Thoughts dominated by loss coupled with poor concentration, tearful spells, and confusion about the future. Serial losses in life (i.e., deaths, divorces, jobs) that led to depression and discouragement. Strong emotional response of sadness exhibited when losses are discussed. Lack of appetite, weight loss, and/or insomnia as well as other depression signs that occurred since the loss. Has been exposed to a traumatic event involving actual or perceived threat of death or serious injury. Reports response of intense fear, helplessness, or horror to the traumatic event. Experiences disturbing and persistent thoughts, images, and/or perceptions of the traumatic event. Displays significant psychological and/or physiological distress resulting from internal and external clues that are reminiscent of the traumatic event. Intentionally avoids thoughts, feelings, or discussions related to the traumatic event. Intentionally avoids activities, places, people, or objects (e.g., up-armored vehicles) that evoke memories  of the event. Displays a significant decline in interest and engagement in activities. Experiences disturbances in sleep. Reports difficulty concentrating as well as feelings of guilt. Reports  hypervigilance. Symptoms present more than one month. Impairment in social, occupational, or other areas of functioning. Depressed or irritable mood. Decrease or loss of appetite. Diminished interest in or enjoyment of activities. Sleeplessness or hypersomnia. Lack of energy. Poor concentration and indecisiveness. Social withdrawal. Feelings of hopelessness, worthlessness, or inappropriate guilt. Unresolved grief issues. History of chronic or recurrent depression for which the client has taken antidepressant medication, been hospitalized, had outpatient treatment, or had a course of electroconvulsive therapy. Goals: Learn and implement coping skills that result in a reduction of anxiety and worry, and improved daily functioning. Enhance ability to effectively cope with the full variety of life's worries and anxieties. Stabilize anxiety level while increasing ability to function on a daily basis. Begin a healthy grieving process around the loss. Develop an awareness of how the avoidance of grieving has affected life and begin the healing process. Resolve the loss, reengaging in old relationships and initiating new contacts with others. Eliminate or reduce the negative impact trauma related symptoms have on social, occupational, and family functioning. Thinks about or openly discusses the traumatic event with others without experiencing psychological or physiological distress. No longer avoids persons, places, activities, and objects that are reminiscent of the traumatic event. Alleviate depressive symptoms and return to previous level of effective functioning. Recognize, accept, and cope with feelings of depression. Develop healthy thinking patterns and beliefs about self, others, and the world that lead to the alleviation and help prevent the relapse of depression. Appropriately grieve the loss in order to normalize mood and to return to previously adaptive level of functioning. Objectives  target date for all objectives is 11/19/2024 Describe situations, thoughts, feelings, and actions associated with anxieties and worries, their impact on functioning, and attempts to resolve them. Verbalize an understanding of the cognitive, physiological, and behavioral components of anxiety and its treatment. Learn and implement calming skills to reduce overall anxiety and manage anxiety symptoms. Learn and implement a strategy to limit the association between various environmental settings and worry, delaying the worry until a designated "worry time." Learn and implement problem-solving strategies for realistically addressing worries. Identify, challenge, and replace biased, fearful self-talk with positive, realistic, and empowering self-talk. Verbalize an understanding of the role that cognitive biases play in excessive irrational worry and persistent anxiety symptoms. Identify and engage in pleasant activities on a daily basis. Learn and implement relapse prevention strategies for managing possible future anxiety symptoms. Learn to accept limitations in life and commit to tolerating, rather than avoiding, unpleasant emotions while accomplishing meaningful goals. Tell in detail the story of the current loss that is triggering symptoms. Identify what stages of grief have been experienced in the continuum of the grieving process. Begin verbalizing feelings associated with the loss. Verbalize resolution of feelings of guilt and regret associated with the loss. Identify and voice positives about the deceased loved one including previous positive experiences, positive characteristics, positive aspects of the relationship, and how these things may be remembered. Describe in as much detail as comfort allows the nature and history of the PTSD symptoms. Learn and implement calming skills. Participate in Cognitive Therapy to help identify, challenge, and replace biased, negative, and self-defeating  thoughts resulting from the trauma. Learn and implement guided self-dialogue to manage thoughts, feelings, and urges brought on by encounters with trauma-related situations. Participate in Eye Movement Desensitization and Reprocessing (  EMDR) to reduce emotional distress related to traumatic thoughts, feelings, and images. Acknowledge the need to implement anger control techniques; learn and implement anger management techniques. Implement a regular exercise regimen as a stress release technique. Describe current and past experiences with depression including their impact on functioning and attempts to resolve it. Verbalize an accurate understanding of depression. Identify and replace thoughts and beliefs that support depression. Learn and implement behavioral strategies to overcome depression. Identify important people in life, past and present, and describe the quality, good and poor, of those relationships. Learn and implement problem-solving and decision-making skills. Learn and implement conflict resolution skills to resolve interpersonal problems. Interventions: Use empathy, compassion, and support, allowing the client to tell in detail the story of his/her recent loss. Assign the client to make a list of all the regrets associated with actions toward or relationship with the deceased; process the list content toward resolution of these feelings. Assist the client in engaging in behaviors that celebrate the positive memorable aspects of the loved one and his/her life (e.g., placing memoriam in newspaper on anniversary of death, volunteering time to a favorite cause of the deceased person). Ask the client to discuss and/or list the positive aspects of and memories about his/her relationship with the lost loved one; reinforce the client's expression of positive memories and emotions (e.g., smiling, laughing); encourage the client to share these thoughts with supportive loved ones. Educate the  client on the stages of the grieving process and answer any questions he/she may have. Assist the client in identifying the stages of grief that he/she has experienced and which stage he/she is presently working through. Ask the client to bring pictures or mementos connected with his/her loss to a session and talk about them (or assign "Creating a [employer]" in the Adult Psychotherapy Homework Planner by Stephannie Li). Assist the client in identifying and expressing feelings connected with his/her loss. Gently and sensitively explore the client's recollection of the facts of the traumatic incident and his/her cognitive and emotional reactions at the time; assess frequency, intensity, duration, and history of the client's PTSD symptoms and their impact on functioning (see "How the Trauma Affects Me" in the Adult Psychotherapy Homework Planner by Jongsma); supplement with semi-structured assessment instrument if desired (see The Anxiety Disorders Interview Schedule - Adult Version). Using Cognitive Therapy techniques, explore the client's self-talk and beliefs about self, others, and the future that are a consequence of the trauma (e.g., themes of safety, trust, power, control, esteem, and intimacy); identify and challenge biases; assist him/her in generating appraisals that correct for the biases; test biased and alternatives predictions through behavioral experiments. Teach the client a guided self-dialogue procedure in which he/she learns to recognize maladaptive self-talk, challenges its biases, copes with engendered feelings, overcomes avoidance, and reinforces his/her accomplishments; review and reinforce progress, problem-solve obstacles. Utilize Eye Movement Desensitization and Reprocessing (EMDR) to reduce the client's emotional reactivity to the traumatic event and reduce PTSD symptoms. Assess the client for instances of poor anger management that have led to threats or actual violence that caused  damage to property and/or injury to people (or assign "Anger Journal" in the Adult Psychotherapy Homework Planner by Stephannie Li). Teach the client anger management techniques (see the Anger Control Problems chapter in this Planner). Develop and encourage a routine of physical exercise for the client. Teach the client calming skills (e.g., breathing retraining, relaxation, calming self-talk) to use in and between sessions when feeling overly distressed. Ask the client to describe his/her past experiences of  anxiety and their impact on functioning; assess the focus, excessiveness, and uncontrollability of the worry and the type, frequency, intensity, and duration of his/her anxiety symptoms (consider using a structured interview such as The Anxiety Disorders Interview Schedule-Adult Version). Explore the client's schema and self-talk that mediate his/her fear response; assist him/her in challenging the biases; replace the distorted messages with reality-based alternatives and positive, realistic self-talk that will increase his/her self-confidence in coping with irrational fears (see Cognitive Therapy of Anxiety Disorders by Laurence Slate). Teach the client problem-solving strategies involving specifically defining a problem, generating options for addressing it, evaluating the pros and cons of each option, selecting and implementing an optional action, and reevaluating and refining the action (or assign "Applying Problem-Solving to Interpersonal Conflict" in the Adult Psychotherapy Homework Planner by Stephannie Li). Engage the client in behavioral activation, increasing the client's contact with sources of reward, identifying processes that inhibit activation, and teaching skills to solve life problems (or assign "Identify and Schedule Pleasant Activities" in the Adult Psychotherapy Homework Planner by Stephannie Li); use behavioral techniques such as instruction, rehearsal, role-playing, role reversal as needed to assist  adoption into the client's daily life; reinforce success. Discuss with the client the distinction between a lapse and relapse, associating a lapse with an initial and reversible return of worry, anxiety symptoms, or urges to avoid, and relapse with the decision to continue the fearful and avoidant patterns. Identify and rehearse with the client the management of future situations or circumstances in which lapses could occur. Instruct the client to routinely use new therapeutic skills (e.g., relaxation, cognitive restructuring, exposure, and problem-solving) in daily life to address emergent worries, anxiety, and avoidant tendencies. Develop a "coping card" on which coping strategies and other important information (e.g., "Breathe deeply and relax," "Challenge unrealistic worries," "Use problem-solving") are written for the client's later use. Use techniques from Acceptance and Commitment Therapy to help client accept uncomfortable realities such as lack of complete control, imperfections, and uncertainty and tolerate unpleasant emotions and thoughts in order to accomplish value-consistent goals. Discuss how generalized anxiety typically involves excessive worry about unrealistic threats, various bodily expressions of tension, overarousal, and hypervigilance, and avoidance of what is threatening that interact to maintain the problem (see Mastery of Your Anxiety and Worry: Therapist Guide by Renard Matter, and Barlow; Treating Generalized Anxiety Disorder by Rygh and Ida Rogue). Teach the client calming/relaxation skills (e.g., applied relaxation, progressive muscle relaxation, cue controlled relaxation; mindful breathing; biofeedback) and how to discriminate better between relaxation and tension; teach the client how to apply these skills to his/her daily life (e.g., New Directions in Progressive Muscle Relaxation by Marcelyn Ditty, and Hazlett-Stevens; Treating Generalized Anxiety Disorder by Rygh and  Ida Rogue). Assign the client to read about progressive muscle relaxation and other calming strategies in relevant books or treatment manuals (e.g., Progressive Relaxation Training by Robb Matar and Alen Blew; Mastery of Your Anxiety and Worry: Workbook by Earlie Counts). Explain the rationale for using a worry time as well as how it is to be used; agree upon and implement a worry time with the client. Teach the client how to recognize, stop, and postpone worry to the agreed upon worry time using skills such as thought stopping, relaxation, and redirecting attention (or assign "Making Use of the Thought-Stopping Technique" and/or "Worry Time" in the Adult Psychotherapy Homework Planner by Jongsma to assist skill development); encourage use in daily life; review and reinforce success while providing corrective feedback toward improvement. Assist the client in analyzing his/her worries by examining potential  biases such as the probability of the negative expectation occurring, the real consequences of it occurring, his/her ability to control the outcome, the worst possible outcome, and his/her ability to accept it (see "Analyze the Probability of a Feared Event" in the Adult Psychotherapy Homework Planner by Stephannie Li; Cognitive Therapy of Anxiety Disorders by Laurence Slate). Encourage the client to share his/her thoughts and feelings of depression; express empathy and build rapport while identifying primary cognitive, behavioral, interpersonal, or other contributors to depression. Conduct Cognitive-Behavioral Therapy (see Cognitive Behavior Therapy by Reola Calkins; Overcoming Depression by Agapito Games al.), beginning with helping the client learn the connection among cognition, depressive feelings, and actions. Assign the client to self-monitor thoughts, feelings, and actions in daily journal (e.g., "Negative Thoughts Trigger Negative Feelings" in the Adult Psychotherapy Homework Planner by Stephannie Li; "Daily Record of  Dysfunctional Thoughts" in Cognitive Therapy of Depression by Ashby Dawes and Shea Evans); process the journal material to challenge depressive thinking patterns and replace them with reality-based thoughts. Facilitate and reinforce the client's shift from biased depressive self-talk and beliefs to reality-based cognitive messages that enhance self-confidence and increase adaptive actions (see "Positive Self-Talk" in the Adult Psychotherapy Homework Planner by Stephannie Li). Assist the client in developing skills that increase the likelihood of deriving pleasure from behavioral activation (e.g., assertiveness skills, developing an exercise plan, less internal/more external focus, increased social involvement); reinforce success. Conduct Interpersonal Therapy (see Interpersonal Psychotherapy of Depression by Casimer Lanius al.), beginning with the assessment of the client's "interpersonal inventory" of important past and present relationships; develop a case formulation linking depression to grief, interpersonal role disputes, role transitions, and/or interpersonal deficits). Conduct Problem-Solving Therapy (see Problem-Solving Therapy by Domenick Bookbinder and Rob Hickman) using techniques such as psychoeducation, modeling, and role-playing to teach client problem-solving skills (i.e., defining a problem specifically, generating possible solutions, evaluating the pros and cons of each solution, selecting and implementing a plan of action, evaluating the efficacy of the plan, accepting or revising the plan); role-play application of the problem-solving skill to a real life issue (or assign "Applying Problem-Solving to Interpersonal Conflict" in the Adult Psychotherapy Homework Planner by Stephannie Li). Teach conflict resolution skills (e.g., empathy, active listening, "I messages," respectful communication, assertiveness without aggression, compromise); use psychoeducation, modeling, role-playing, and rehearsal to work through several  current conflicts; assign homework exercises; review and repeat so as to integrate their use into the client's life. Consistent with the treatment model, discuss how cognitive, behavioral, interpersonal, and/or other factors (e.g., family history) contribute to depression.  Diagnosis:Generalized anxiety disorder  Current mild episode of major depressive disorder without prior episode [facility])  Uncomplicated bereavement  Plan:  -meet again on Wednesday, January 03, 2024 at 10am virtually.

## 2023-12-21 DIAGNOSIS — H40003 Preglaucoma, unspecified, bilateral: Secondary | ICD-10-CM | POA: Diagnosis not present

## 2023-12-21 DIAGNOSIS — Z961 Presence of intraocular lens: Secondary | ICD-10-CM | POA: Diagnosis not present

## 2023-12-21 DIAGNOSIS — H2511 Age-related nuclear cataract, right eye: Secondary | ICD-10-CM | POA: Diagnosis not present

## 2023-12-21 DIAGNOSIS — H35372 Puckering of macula, left eye: Secondary | ICD-10-CM | POA: Diagnosis not present

## 2023-12-25 ENCOUNTER — Other Ambulatory Visit: Payer: Self-pay | Admitting: *Deleted

## 2023-12-25 MED ORDER — FINASTERIDE 5 MG PO TABS: 5.0000 mg | ORAL_TABLET | Freq: Every day | ORAL | 3 refills | Status: DC

## 2023-12-26 ENCOUNTER — Ambulatory Visit: Payer: Medicare Other | Admitting: Physical Therapy

## 2023-12-26 ENCOUNTER — Other Ambulatory Visit: Payer: Self-pay | Admitting: Family Medicine

## 2023-12-26 DIAGNOSIS — R269 Unspecified abnormalities of gait and mobility: Secondary | ICD-10-CM

## 2023-12-26 DIAGNOSIS — R262 Difficulty in walking, not elsewhere classified: Secondary | ICD-10-CM | POA: Diagnosis not present

## 2023-12-26 DIAGNOSIS — R279 Unspecified lack of coordination: Secondary | ICD-10-CM | POA: Diagnosis not present

## 2023-12-26 DIAGNOSIS — R2681 Unsteadiness on feet: Secondary | ICD-10-CM

## 2023-12-26 DIAGNOSIS — G8929 Other chronic pain: Secondary | ICD-10-CM

## 2023-12-26 DIAGNOSIS — N400 Enlarged prostate without lower urinary tract symptoms: Secondary | ICD-10-CM

## 2023-12-26 DIAGNOSIS — M25511 Pain in right shoulder: Secondary | ICD-10-CM | POA: Diagnosis not present

## 2023-12-26 DIAGNOSIS — M6281 Muscle weakness (generalized): Secondary | ICD-10-CM | POA: Diagnosis not present

## 2023-12-26 MED ORDER — TAMSULOSIN HCL 0.4 MG PO CAPS: 0.4000 mg | ORAL_CAPSULE | Freq: Every day | ORAL | 1 refills | Status: DC

## 2023-12-26 NOTE — Therapy (Unsigned)
OUTPATIENT PHYSICAL THERAPY TREATMENT NOTE  Patient Name: Tom Underwood MRN: [id number] DOB:September 12, 1951, 73 y.o., male Today's Date: 12/26/2023  PCP: Alba Cory, MD REFERRING PROVIDER: Caro Laroche, DO    PT End of Session - 12/26/23 1403     Visit Number 65    Number of Visits 70    Date for PT Re-Evaluation 01/03/24    PT Start Time 1345    PT Stop Time 1440    PT Time Calculation (min) 55 min    Equipment Utilized During Treatment Gait belt    Activity Tolerance Patient limited by fatigue    Behavior During Therapy WFL for tasks assessed/performed              Past Medical History:  Diagnosis Date   Anxiety    Arthritis    Atrial fibrillation (HCC)    a.) CHA2DS2-VASc = 4 (age, HTN, CVA x2). b.) s/p 200 J synchronized cardioversion (DCCV) on 12/08/2021. c.) rate/rythum maintained on oral metoprolol tartrate; chronically anticoagulated using rivaroxaban.   BPH (benign prostatic hyperplasia)    DDD (degenerative disc disease), lumbar    Decreased libido    Depression    Diverticulitis    Diverticulosis    GERD (gastroesophageal reflux disease)    Hepatic steatosis    History of 2019 novel coronavirus disease (COVID-19) 12/28/2020   History of cardiac murmur in childhood    History of kidney stones    HLD (hyperlipidemia)    Hypertension    Hypogonadism in male    OSA on CPAP    Pre-diabetes    Rhinitis, allergic    Stroke (HCC) 07/23/2019   a.) LEFT posterior cor radiata. b.) residual RIGHT sided weakness   Past Surgical History:  Procedure Laterality Date   CARDIOVERSION N/A 12/08/2021   Procedure: CARDIOVERSION;  Surgeon: Yvonne Kendall, MD;  Location: [facility] ORS;  Service: Cardiovascular;  Laterality: N/A;   COLONOSCOPY     COLONOSCOPY WITH PROPOFOL N/A 10/06/2023   Procedure: COLONOSCOPY WITH PROPOFOL;  Surgeon: Wyline Mood, MD;  Location: [facility] ENDOSCOPY;  Service: Gastroenterology;  Laterality: N/A;   EVALUATION UNDER ANESTHESIA  WITH HEMORRHOIDECTOMY N/A 04/15/2022   Procedure: EXAM UNDER ANESTHESIA WITH HEMORRHOIDECTOMY;  Surgeon: Sung Amabile, DO;  Location: [facility] ORS;  Service: General;  Laterality: N/A;   EXTERNAL EAR SURGERY     EYE SURGERY     FINGER SURGERY Right    lateration to 5 th digit   KNEE SURGERY     left eye     POLYPECTOMY  10/06/2023   Procedure: POLYPECTOMY;  Surgeon: Wyline Mood, MD;  Location: [facility] ENDOSCOPY;  Service: Gastroenterology;;   RECTAL EXAM UNDER ANESTHESIA N/A 04/23/2022   Procedure: RECTAL EXAM UNDER ANESTHESIA WITH LIGATION OF BLEEDING;  Surgeon: Henrene Dodge, MD;  Location: [facility] ORS;  Service: General;  Laterality: N/A;   Patient Active Problem List   Diagnosis Date Noted   History of atrial fibrillation 11/22/2023   Major depression, recurrent, chronic (HCC) 11/22/2023   Adenomatous polyp of colon 10/06/2023   Gastroesophageal reflux disease without esophagitis 05/17/2022   History of syncope 05/17/2022   History of hemorrhoidectomy 05/17/2022   Vitamin D deficiency 05/17/2022   Lower extremity edema 05/17/2022   History of rectal bleeding 05/17/2022   Morbid obesity (HCC)    Hypertension    Hemiparesis of right dominant side as late effect of cerebral infarction (HCC) 08/13/2019   History of kidney stones 05/02/2018   ED (erectile dysfunction) 05/02/2018   Hyperglycemia 01/30/2017  Arthritis of knee, degenerative 01/30/2017   Benign prostatic hyperplasia without lower urinary tract symptoms 02/03/2016   Dyslipidemia 08/04/2015   OSA on CPAP 06/30/2015    REFERRING DIAG: M25.511,M25.512 (ICD-10-CM) - Bilateral shoulder pain, unspecified chronicity M17.2 (ICD-10-CM) - Post-traumatic osteoarthritis of both knees  R26.81 (ICD-10-CM) - Gait instability   THERAPY DIAG:  Muscle weakness (generalized)   Unsteadiness on feet   Abnormality of gait and mobility   Unspecified lack of coordination   Other abnormalities of gait and mobility   Difficulty in walking,  not elsewhere classified  PERTINENT HISTORY: See evaluation  PRECAUTIONS: Fall  10/30:  : 460 feet; vitals post : 96% O2, 58 bpm, 132/49 sitting BP  TUG: 22.05 seconds with SPC  DGI: 15/24  SUBJECTIVE:  12/26/2023  Pt. Entered PT prepared and ready for session. Pt. States feeling fatigued prior to treatment session. Pt. Reports feeling "pretty good overall."  TODAY'S TREATMENT:                 Sitting BP at start of session: 157/70, 97% O2, 57 bpm    There ex.:               Nustep L6 for 15 mins. B UE/LE.  Seat position #10/ arm position #10.  0.67  miles.      Nautilus: standing lat. Pull downs 60# 22x/ seated lat. Pull downs 60# 22x.     TRX squats 24x2, Chair behind patient for cue on squat depth.    PT reviewed pts. Daily activity/ HEP/ use of compression stockings.  Several seated rest breaks during there.ex. secondary to fatigue/ SOB.  PT monitored pts. Vitals t/o tx. Session.     Standing marching/ hip abduction in //-bars 20x.              Walking in //-bars with focus on consistent recip. Hip flexion/ heel strike.  Added use of hurdles and cuing to prevent hip circumduction.     PATIENT EDUCATION: Education details: HEP/ daily activity Person educated: Patient Education method: Medical illustrator Education comprehension: verbalized understanding and returned demonstration  HOME EXERCISE PROGRAM: Handouts given for shoulder ROM/ strengthening/ standing hip ex.    PT Short Term Goals -  12/06/23      PT SHORT TERM GOAL #1   Title Pt able to complete and ambulate >800 feet with no assistive device safely.     Baseline See above   Time 4    Period Weeks    Status Partially met          PT Long Term Goals - 01/03/24      PT LONG TERM GOAL #1   Title Pt. Able to complete 10 steps with recip. Pattern and 1 handrail assist with mod. Independence safely.    Baseline Pt. Limited with R hip flexion/ recip. Gait pattern and requires heavy  UE assist.  Pt. Has ramp at home with stairs at side   Time 8   Period Weeks    Status Partially met   Target Date 01/03/24     PT LONG TERM GOAL #2   Title Pt wil improve TUG score to < 12 sec with no AD to display significant imporvement in reduced risk of falls    Baseline See above   Time 8   Period Weeks    Status Goal met   Target Date 11/08/2023     PT LONG TERM GOAL #3   Title Pt will improve DGI  to >19/24 to indicate decreased risk of falls with household and community walking tasks    Baseline 11/23: 11/24 1/19:12/24 8/16: 14/24, 10/30: 15/24   Time 8    Period Weeks    Status Partially Met    Target Date 01/03/2024     PT LONG TERM GOAL #4   Title Pt. Will increase R hip flexion/ LE muscle strength 1/2 muscle grade to improve standing tolerance/ walking endurance.    Baseline See above.  B hip flexion 4+/5 MMT (good control on R hip today), 10/30: see for updated scores   Time 8    Period Weeks    Status Partially Met    Target Date 01/03/2024         Plan -     Clinical Impression Statement Pt. Arrived to PT with B lower leg compression stockings and use of SPC/ step to gait pattern.  B knee discomfort with standing/ walking tasks but pain remained <5/10 t/o tx. Session.  PT tx. Focused on generalized strengthening/ endurance ex.  Pt education about RPE scale to prevent fatigue and adverese affect on Vitals.  Patient will continue to benefit from skilled PT services to progress strength and mobility, along with cardiovascular endurance with HEP and reducing frequencies in next two months as mentioned above.      Personal Factors and Comorbidities Comorbidity 2    Comorbidities Hypertension, Obesity    Examination-Activity Limitations Bed Mobility;Reach Overhead;Squat;Lift;Dressing;Hygiene/Grooming    Examination-Participation Restrictions Community Activity;Yard Work    Conservation officer, historic buildings Evolving/Moderate complexity    Clinical Decision Making  Moderate    Rehab Potential Fair    PT Frequency 1x / week    PT Duration 8 weeks    PT Treatment/Interventions ADLs/Self Care Home Management;Gait training;Stair training;Functional mobility training;Therapeutic activities;Therapeutic exercise;Balance training;Neuromuscular re-education;Manual techniques;Patient/family education;Electrical Stimulation;Moist Heat;Cryotherapy    PT POC CHECK BP   PT Home Exercise Plan Progress endurance training to improve functional activity, follow-up on RPE scale (6-7/10).   Consulted and Agree with Plan of Care Patient         Cammie Mcgee, PT, DPT # [id number] 2:44 PM,12/26/23         Today's Date: 12/26/2023  PCP: Alba Cory, MD REFERRING PROVIDER: Caro Laroche, DO    PT End of Session - 12/26/23 1403     Visit Number 65    Number of Visits 70    Date for PT Re-Evaluation 01/03/24    PT Start Time 1345    PT Stop Time 1440    PT Time Calculation (min) 55 min    Equipment Utilized During Treatment Gait belt    Activity Tolerance Patient limited by fatigue    Behavior During Therapy WFL for tasks assessed/performed              Past Medical History:  Diagnosis Date   Anxiety    Arthritis    Atrial fibrillation (HCC)    a.) CHA2DS2-VASc = 4 (age, HTN, CVA x2). b.) s/p 200 J synchronized cardioversion (DCCV) on 12/08/2021. c.) rate/rythum maintained on oral metoprolol tartrate; chronically anticoagulated using rivaroxaban.   BPH (benign prostatic hyperplasia)    DDD (degenerative disc disease), lumbar    Decreased libido    Depression    Diverticulitis    Diverticulosis    GERD (gastroesophageal reflux disease)    Hepatic steatosis    History of 2019 novel coronavirus disease (COVID-19) 12/28/2020   History of cardiac murmur in childhood  History of kidney stones    HLD (hyperlipidemia)    Hypertension    Hypogonadism in male    OSA on CPAP    Pre-diabetes    Rhinitis, allergic    Stroke (HCC) 07/23/2019    a.) LEFT posterior cor radiata. b.) residual RIGHT sided weakness   Past Surgical History:  Procedure Laterality Date   CARDIOVERSION N/A 12/08/2021   Procedure: CARDIOVERSION;  Surgeon: Yvonne Kendall, MD;  Location: [facility] ORS;  Service: Cardiovascular;  Laterality: N/A;   COLONOSCOPY     COLONOSCOPY WITH PROPOFOL N/A 10/06/2023   Procedure: COLONOSCOPY WITH PROPOFOL;  Surgeon: Wyline Mood, MD;  Location: [facility] ENDOSCOPY;  Service: Gastroenterology;  Laterality: N/A;   EVALUATION UNDER ANESTHESIA WITH HEMORRHOIDECTOMY N/A 04/15/2022   Procedure: EXAM UNDER ANESTHESIA WITH HEMORRHOIDECTOMY;  Surgeon: Sung Amabile, DO;  Location: [facility] ORS;  Service: General;  Laterality: N/A;   EXTERNAL EAR SURGERY     EYE SURGERY     FINGER SURGERY Right    lateration to 5 th digit   KNEE SURGERY     left eye     POLYPECTOMY  10/06/2023   Procedure: POLYPECTOMY;  Surgeon: Wyline Mood, MD;  Location: [facility] ENDOSCOPY;  Service: Gastroenterology;;   RECTAL EXAM UNDER ANESTHESIA N/A 04/23/2022   Procedure: RECTAL EXAM UNDER ANESTHESIA WITH LIGATION OF BLEEDING;  Surgeon: Henrene Dodge, MD;  Location: [facility] ORS;  Service: General;  Laterality: N/A;   Patient Active Problem List   Diagnosis Date Noted   History of atrial fibrillation 11/22/2023   Major depression, recurrent, chronic (HCC) 11/22/2023   Adenomatous polyp of colon 10/06/2023   Gastroesophageal reflux disease without esophagitis 05/17/2022   History of syncope 05/17/2022   History of hemorrhoidectomy 05/17/2022   Vitamin D deficiency 05/17/2022   Lower extremity edema 05/17/2022   History of rectal bleeding 05/17/2022   Morbid obesity (HCC)    Hypertension    Hemiparesis of right dominant side as late effect of cerebral infarction (HCC) 08/13/2019   History of kidney stones 05/02/2018   ED (erectile dysfunction) 05/02/2018   Hyperglycemia 01/30/2017   Arthritis of knee, degenerative 01/30/2017   Benign prostatic hyperplasia without lower  urinary tract symptoms 02/03/2016   Dyslipidemia 08/04/2015   OSA on CPAP 06/30/2015    REFERRING DIAG: M25.511,M25.512 (ICD-10-CM) - Bilateral shoulder pain, unspecified chronicity M17.2 (ICD-10-CM) - Post-traumatic osteoarthritis of both knees  R26.81 (ICD-10-CM) - Gait instability   THERAPY DIAG:  Muscle weakness (generalized)   Unsteadiness on feet   Abnormality of gait and mobility   Unspecified lack of coordination   Other abnormalities of gait and mobility   Difficulty in walking, not elsewhere classified  PERTINENT HISTORY: See evaluation  PRECAUTIONS: Fall  10/30:  : 460 feet; vitals post : 96% O2, 58 bpm, 132/49 sitting BP  TUG: 22.05 seconds with SPC  DGI: 15/24  SUBJECTIVE:  12/26/2023  Pt. Entered PT with B lower leg compression stockings.  Pt. Reports benefit from stockings with daily tasks.  Pt. Reports 3/10 B knee discomfort with standing and walking activities.  Pt. Has not been seen by PT over past month secondary pt. Out of town and holidays.      TODAY'S TREATMENT:                 Sitting BP at middle of session: 97% O2, 62 bpm    There ex.:               Nustep L6 for 15 mins. B  UE/LE.  Seat position #10/ arm position #10.  0.67  miles.     Nautilus: standing lat. Pull downs 60# 22x/ seated lat. Pull downs 60# 22x.     TRX squats 24x2, Chair behind patient for cue on squat depth. RPE 6/10 and NPS 3/10 (knees)  PT reviewed pts. Daily activity/ HEP/ use of compression stockings.  Several seated rest breaks during there.ex. secondary to fatigue/ SOB.  PT monitored pts. Vitals t/o tx. Session.    NOT TODAY:   -  Standing marching/ hip abduction in //-bars 20x. - Walking in //-bars with focus on consistent recip. Hip flexion/ heel strike.  Added use of hurdles and cuing to prevent hip circumduction.     PATIENT EDUCATION: Education details: HEP/ daily activity Person educated: Patient Education method: Software engineer Education comprehension: verbalized understanding and returned demonstration  HOME EXERCISE PROGRAM: Handouts given for shoulder ROM/ strengthening/ standing hip ex.    PT Short Term Goals -  12/06/23      PT SHORT TERM GOAL #1   Title Pt able to complete and ambulate >800 feet with no assistive device safely.     Baseline See above   Time 4    Period Weeks    Status Partially met          PT Long Term Goals - 01/03/24      PT LONG TERM GOAL #1   Title Pt. Able to complete 10 steps with recip. Pattern and 1 handrail assist with mod. Independence safely.    Baseline Pt. Limited with R hip flexion/ recip. Gait pattern and requires heavy UE assist.  Pt. Has ramp at home with stairs at side   Time 8   Period Weeks    Status Partially met   Target Date 01/03/24     PT LONG TERM GOAL #2   Title Pt wil improve TUG score to < 12 sec with no AD to display significant imporvement in reduced risk of falls    Baseline See above   Time 8   Period Weeks    Status Goal met   Target Date 11/08/2023     PT LONG TERM GOAL #3   Title Pt will improve DGI to >19/24 to indicate decreased risk of falls with household and community walking tasks    Baseline 11/23: 11/24 1/19:12/24 8/16: 14/24, 10/30: 15/24   Time 8    Period Weeks    Status Partially Met    Target Date 01/03/2024     PT LONG TERM GOAL #4   Title Pt. Will increase R hip flexion/ LE muscle strength 1/2 muscle grade to improve standing tolerance/ walking endurance.    Baseline See above.  B hip flexion 4+/5 MMT (good control on R hip today), 10/30: see for updated scores   Time 8    Period Weeks    Status Partially Met    Target Date 01/03/2024         Plan -     Clinical Impression Statement Pt. Arrived to PT with B lower leg compression stockings and use of SPC/ step to gait pattern.  B knee discomfort with standing/ walking tasks but pain remained <5/10 t/o tx. Session.  PT tx. Focused on generalized  strengthening/ endurance ex.  Pt education about RPE scale to prevent fatigue and adverese affect on Vitals.  Patient will continue to benefit from skilled PT services to progress strength and mobility, along with cardiovascular endurance with HEP  and reducing frequencies in next two months as mentioned above.      Personal Factors and Comorbidities Comorbidity 2    Comorbidities Hypertension, Obesity    Examination-Activity Limitations Bed Mobility;Reach Overhead;Squat;Lift;Dressing;Hygiene/Grooming    Examination-Participation Restrictions Community Activity;Yard Work    Conservation officer, historic buildings Evolving/Moderate complexity    Clinical Decision Making Moderate    Rehab Potential Fair    PT Frequency 1x / week    PT Duration 8 weeks    PT Treatment/Interventions ADLs/Self Care Home Management;Gait training;Stair training;Functional mobility training;Therapeutic activities;Therapeutic exercise;Balance training;Neuromuscular re-education;Manual techniques;Patient/family education;Electrical Stimulation;Moist Heat;Cryotherapy    PT POC CHECK BP   PT Home Exercise Plan Progress endurance training to improve functional activity, follow-up on RPE scale (6-7/10).   Consulted and Agree with Plan of Care Patient         Cammie Mcgee, PT, DPT # [phone] 2:44 PM,12/26/23  [employer] SPT

## 2023-12-26 NOTE — Telephone Encounter (Signed)
Medication Refill -  Most Recent Primary Care Visit:  Provider: Alba Cory  Department: [facility]  Visit Type: OFFICE VISIT  Date: 11/22/2023  Medication: tamsulosin (FLOMAX) 0.4 MG CAPS capsule [[id number]]   Has the patient contacted their pharmacy? Yes  (Agent: If yes, when and what did the pharmacy advise?) Contact Office, pharmacy sent request but hasn't heard back   Is this the correct pharmacy for this prescription? Yes  This is the patient's preferred pharmacy:    [facility] - [place], [place] - [address] [place] [address] Phone: [phone] Fax: [phone]   Has the prescription been filled recently? Yes  Is the patient out of the medication? Yes  Has the patient been seen for an appointment in the last year OR does the patient have an upcoming appointment? Yes  Can we respond through MyChart? Yes  Agent: Please be advised that Rx refills may take up to 3 business days. We ask that you follow-up with your pharmacy.

## 2023-12-26 NOTE — Telephone Encounter (Signed)
Change in pharmacy- request [facility] Requested Prescriptions  Pending Prescriptions Disp Refills   tamsulosin (FLOMAX) 0.4 MG CAPS capsule 90 capsule 1    Sig: Take 1 capsule (0.4 mg total) by mouth daily.     Urology: Alpha-Adrenergic Blocker Passed - 12/26/2023  3:21 PM      Passed - PSA in normal range and within 360 days    PSA  Date Value Ref Range Status  03/10/2017 0.25 0.00 - 4.00 ng/mL Final    Comment:    (NOTE) While PSA levels of <=4.0 ng/ml are reported as reference range, some men with levels below 4.0 ng/ml can have prostate cancer and many men with PSA above 4.0 ng/ml do not have prostate cancer.  Other tests such as free PSA, age specific reference ranges, PSA velocity and PSA doubling time may be helpful especially in men less than 54 years old. Performed at [facility], [address] [place], [place] [address]    Prostate Specific Ag, Serum  Date Value Ref Range Status  06/30/2023 0.2 0.0 - 4.0 ng/mL Final    Comment:    Roche ECLIA methodology. According to the American Urological Association, Serum PSA should decrease and remain at undetectable levels after radical prostatectomy. The AUA defines biochemical recurrence as an initial PSA value 0.2 ng/mL or greater followed by a subsequent confirmatory PSA value 0.2 ng/mL or greater. Values obtained with different assay methods or kits cannot be used interchangeably. Results cannot be interpreted as absolute evidence of the presence or absence of malignant disease.          Passed - Last BP in normal range    BP Readings from Last 1 Encounters:  11/22/23 130/76         Passed - Valid encounter within last 12 months    Recent Outpatient Visits           1 month ago Hemiparesis of right dominant side as late effect of cerebral infarction [facility])   [place] [facility] Alba Cory, MD   7 months ago Morbid obesity [facility])   [place] [facility] Alba Cory, MD   10 months ago Persistent atrial fibrillation [facility])   [place] [facility] Alba Cory, MD   1 year ago Paroxysmal A-fib [facility])   [place] [facility] Alba Cory, MD   1 year ago Need for immunization against influenza   [facility] Alba Cory, MD       Future Appointments             In 4 months Carlynn Purl, Danna Hefty, MD [facility], [facility]

## 2023-12-27 ENCOUNTER — Ambulatory Visit: Payer: Medicare Other | Admitting: Professional

## 2023-12-27 ENCOUNTER — Encounter: Payer: Self-pay | Admitting: Physical Therapy

## 2023-12-29 ENCOUNTER — Other Ambulatory Visit: Payer: Self-pay | Admitting: Family Medicine

## 2023-12-29 ENCOUNTER — Other Ambulatory Visit: Payer: Self-pay

## 2023-12-29 DIAGNOSIS — F339 Major depressive disorder, recurrent, unspecified: Secondary | ICD-10-CM

## 2023-12-29 DIAGNOSIS — E785 Hyperlipidemia, unspecified: Secondary | ICD-10-CM

## 2023-12-29 DIAGNOSIS — K219 Gastro-esophageal reflux disease without esophagitis: Secondary | ICD-10-CM

## 2023-12-29 MED ORDER — PANTOPRAZOLE SODIUM 20 MG PO TBEC: 20.0000 mg | DELAYED_RELEASE_TABLET | Freq: Every day | ORAL | 1 refills | Status: DC

## 2023-12-29 MED ORDER — OMEGA-3-ACID ETHYL ESTERS 1 G PO CAPS: 1.0000 g | ORAL_CAPSULE | Freq: Two times a day (BID) | ORAL | 1 refills | Status: DC

## 2023-12-29 MED ORDER — BUPROPION HCL ER (XL) 300 MG PO TB24: 300.0000 mg | ORAL_TABLET | Freq: Every morning | ORAL | 1 refills | Status: DC

## 2023-12-29 NOTE — Telephone Encounter (Signed)
Per pt insurance only [facility] he never received the rx from Dec.

## 2023-12-29 NOTE — Telephone Encounter (Signed)
Medication Refill -  Most Recent Primary Care Visit:  Provider: Alba Cory  Department: [facility]  Visit Type: OFFICE VISIT  Date: 11/22/2023  Medication: pantoprazole (PROTONIX) 20 MG tablet buPROPion (WELLBUTRIN XL) 300 MG 24 hr tablet Dorinda Hill calling to check on status of request that was sent on 12/22/2023. Requesting refills for prescriptions to be sent to pharmacy below.  Has the patient contacted their pharmacy? Yes  Is this the correct pharmacy for this prescription? Yes This is the patient's preferred pharmacy:  [facility] - [place], [place] - [address] [place] [address] Phone: [phone] Fax: [phone]   Has the prescription been filled recently? No  Is the patient out of the medication? Unsure  Has the patient been seen for an appointment in the last year OR does the patient have an upcoming appointment? Yes  Can we respond through MyChart? Yes  Agent: Please be advised that Rx refills may take up to 3 business days. We ask that you follow-up with your pharmacy.

## 2023-12-29 NOTE — Telephone Encounter (Signed)
Transferring to mail order since has requested x 2 now.  Requested Prescriptions  Pending Prescriptions Disp Refills   buPROPion (WELLBUTRIN XL) 300 MG 24 hr tablet 90 tablet 1    Sig: Take 1 tablet (300 mg total) by mouth every morning.     Psychiatry: Antidepressants - bupropion Passed - 12/29/2023  5:47 PM      Passed - Cr in normal range and within 360 days    Creatinine, Ser  Date Value Ref Range Status  06/30/2023 1.08 0.76 - 1.27 mg/dL Final         Passed - AST in normal range and within 360 days    AST  Date Value Ref Range Status  06/30/2023 24 0 - 40 IU/L Final         Passed - ALT in normal range and within 360 days    ALT  Date Value Ref Range Status  06/30/2023 23 0 - 44 IU/L Final         Passed - Completed PHQ-2 or PHQ-9 in the last 360 days      Passed - Last BP in normal range    BP Readings from Last 1 Encounters:  11/22/23 130/76         Passed - Valid encounter within last 6 months    Recent Outpatient Visits           1 month ago Hemiparesis of right dominant side as late effect of cerebral infarction [facility])   [facility] Alba Cory, MD   7 months ago Morbid obesity [facility])   [facility] Alba Cory, MD   10 months ago Persistent atrial fibrillation [facility])   [facility] Alba Cory, MD   1 year ago Paroxysmal A-fib [facility])   [facility] Alba Cory, MD   1 year ago Need for immunization against influenza   [facility] Alba Cory, MD       Future Appointments             In 4 months Alba Cory, MD [facility]             pantoprazole (PROTONIX) 20 MG tablet 90 tablet 1    Sig: Take 1 tablet (20 mg total) by mouth daily.     Gastroenterology: Proton Pump Inhibitors Passed - 12/29/2023  5:47 PM      Passed - Valid encounter within last 12 months     Recent Outpatient Visits           1 month ago Hemiparesis of right dominant side as late effect of cerebral infarction [facility])   [facility] Alba Cory, MD   7 months ago [facility]bid [facility])   [facility] Alba Cory, MD   10 months ago Persistent atrial fibrillation [facility])   [facility] Alba Cory, MD   1 year ago Paroxysmal A-fib [facility])   [facility] Alba Cory, MD   1 year ago Need for immunization against influenza   [facility] Alba Cory, MD       Future Appointments             In 4 months Alba Cory, MD [facility], [facility]

## 2024-01-01 DIAGNOSIS — D0359 Melanoma in situ of other part of trunk: Secondary | ICD-10-CM | POA: Diagnosis not present

## 2024-01-01 DIAGNOSIS — L905 Scar conditions and fibrosis of skin: Secondary | ICD-10-CM | POA: Diagnosis not present

## 2024-01-03 ENCOUNTER — Ambulatory Visit: Payer: Medicare Other | Admitting: Professional

## 2024-01-10 ENCOUNTER — Ambulatory Visit: Payer: PRIVATE HEALTH INSURANCE | Admitting: Professional

## 2024-01-10 ENCOUNTER — Encounter: Payer: Self-pay | Admitting: Family Medicine

## 2024-01-15 DIAGNOSIS — C44712 Basal cell carcinoma of skin of right lower limb, including hip: Secondary | ICD-10-CM | POA: Diagnosis not present

## 2024-01-16 ENCOUNTER — Ambulatory Visit: Payer: Medicare Other | Attending: Family Medicine | Admitting: Physical Therapy

## 2024-01-17 ENCOUNTER — Ambulatory Visit: Payer: Medicare Other | Admitting: Professional

## 2024-01-17 ENCOUNTER — Encounter: Payer: Self-pay | Admitting: Professional

## 2024-01-17 DIAGNOSIS — F320 Major depressive disorder, single episode, mild: Secondary | ICD-10-CM | POA: Diagnosis not present

## 2024-01-17 DIAGNOSIS — F411 Generalized anxiety disorder: Secondary | ICD-10-CM

## 2024-01-17 DIAGNOSIS — Z634 Disappearance and death of family member: Secondary | ICD-10-CM | POA: Diagnosis not present

## 2024-01-17 NOTE — Progress Notes (Signed)
[facility] Counselor/Therapist Progress Note  Patient ID: QUINDARIUS CABELLO, MRN: [id number],    Date: 01/17/2024  Time Spent: 54 minutes 1003-1057am   Treatment Type: Individual Therapy  Risk Assessment: Danger to Self:  No Self-injurious Behavior: No Danger to Others: No Duty to Warn:no  Subjective: This session was held via video teletherapy. The patient consented to video teletherapy and was located at his home during this session. He is aware it is the responsibility of the patient to secure confidentiality on her end of the session. The provider was in a private home office for the duration of this session.    The patient arrived on time for his Caregility appointment.   Issues addressed: 1-mood -anxious, depressed, and bored -escapes through sleep 2-social -pt is alone while wife is at work -pt doesn't think he can participate at the [facility] -pt admits that he is better than he used to be but his knees aren't getting any better -pt enjoys talking about baseball and was encouraged to  -consider getting involved again in [employer] -has had several minor surgeries since our last session -sedentary -how to increase activity level -talk to PT about what he can/cannot do -commit to change   Treatment Plan Problems: Anxiety, Unipolar Depression, Posttraumatic Stress Disorder (PTSD), Grief / Loss Unresolved,  Symptoms: Excessive and/or unrealistic worry that is difficult to control occurring more days than not for at least 6 months about a number of events or activities. Motor tension (e.g., restlessness, tiredness, shakiness, muscle tension). Hypervigilance (e.g., feeling constantly on edge, experiencing concentration difficulties, having trouble falling or staying asleep, exhibiting a general state of irritability). Thoughts dominated by loss coupled with poor concentration, tearful spells, and confusion about the future. Serial losses in  life (i.e., deaths, divorces, jobs) that led to depression and discouragement. Strong emotional response of sadness exhibited when losses are discussed. Lack of appetite, weight loss, and/or insomnia as well as other depression signs that occurred since the loss. Has been exposed to a traumatic event involving actual or perceived threat of death or serious injury. Reports response of intense fear, helplessness, or horror to the traumatic event. Experiences disturbing and persistent thoughts, images, and/or perceptions of the traumatic event. Displays significant psychological and/or physiological distress resulting from internal and external clues that are reminiscent of the traumatic event. Intentionally avoids thoughts, feelings, or discussions related to the traumatic event. Intentionally avoids activities, places, people, or objects (e.g., up-armored vehicles) that evoke memories of the event. Displays a significant decline in interest and engagement in activities. Experiences disturbances in sleep. Reports difficulty concentrating as well as feelings of guilt. Reports hypervigilance. Symptoms present more than one month. Impairment in social, occupational, or other areas of functioning. Depressed or irritable mood. Decrease or loss of appetite. Diminished interest in or enjoyment of activities. Sleeplessness or hypersomnia. Lack of energy. Poor concentration and indecisiveness. Social withdrawal. Feelings of hopelessness, worthlessness, or inappropriate guilt. Unresolved grief issues. History of chronic or recurrent depression for which the client has taken antidepressant medication, been hospitalized, had outpatient treatment, or had a course of electroconvulsive therapy. Goals: Learn and implement coping skills that result in a reduction of anxiety and worry, and improved daily functioning. Enhance ability to effectively cope with the full variety of life's worries and  anxieties. Stabilize anxiety level while increasing ability to function on a daily basis. Begin a healthy grieving process around the loss. Develop an awareness of how the avoidance of grieving has affected life and  begin the healing process. Resolve the loss, reengaging in old relationships and initiating new contacts with others. Eliminate or reduce the negative impact trauma related symptoms have on social, occupational, and family functioning. Thinks about or openly discusses the traumatic event with others without experiencing psychological or physiological distress. No longer avoids persons, places, activities, and objects that are reminiscent of the traumatic event. Alleviate depressive symptoms and return to previous level of effective functioning. Recognize, accept, and cope with feelings of depression. Develop healthy thinking patterns and beliefs about self, others, and the world that lead to the alleviation and help prevent the relapse of depression. Appropriately grieve the loss in order to normalize mood and to return to previously adaptive level of functioning. Objectives target date for all objectives is 11/19/2024 Describe situations, thoughts, feelings, and actions associated with anxieties and worries, their impact on functioning, and attempts to resolve them. Verbalize an understanding of the cognitive, physiological, and behavioral components of anxiety and its treatment. Learn and implement calming skills to reduce overall anxiety and manage anxiety symptoms. Learn and implement a strategy to limit the association between various environmental settings and worry, delaying the worry until a designated "worry time." Learn and implement problem-solving strategies for realistically addressing worries. Identify, challenge, and replace biased, fearful self-talk with positive, realistic, and empowering self-talk. Verbalize an understanding of the role that cognitive biases play in  excessive irrational worry and persistent anxiety symptoms. Identify and engage in pleasant activities on a daily basis. Learn and implement relapse prevention strategies for managing possible future anxiety symptoms. Learn to accept limitations in life and commit to tolerating, rather than avoiding, unpleasant emotions while accomplishing meaningful goals. Tell in detail the story of the current loss that is triggering symptoms. Identify what stages of grief have been experienced in the continuum of the grieving process. Begin verbalizing feelings associated with the loss. Verbalize resolution of feelings of guilt and regret associated with the loss. Identify and voice positives about the deceased loved one including previous positive experiences, positive characteristics, positive aspects of the relationship, and how these things may be remembered. Describe in as much detail as comfort allows the nature and history of the PTSD symptoms. Learn and implement calming skills. Participate in Cognitive Therapy to help identify, challenge, and replace biased, negative, and self-defeating thoughts resulting from the trauma. Learn and implement guided self-dialogue to manage thoughts, feelings, and urges brought on by encounters with trauma-related situations. Participate in Eye Movement Desensitization and Reprocessing (EMDR) to reduce emotional distress related to traumatic thoughts, feelings, and images. Acknowledge the need to implement anger control techniques; learn and implement anger management techniques. Implement a regular exercise regimen as a stress release technique. Describe current and past experiences with depression including their impact on functioning and attempts to resolve it. Verbalize an accurate understanding of depression. Identify and replace thoughts and beliefs that support depression. Learn and implement behavioral strategies to overcome depression. Identify important  people in life, past and present, and describe the quality, good and poor, of those relationships. Learn and implement problem-solving and decision-making skills. Learn and implement conflict resolution skills to resolve interpersonal problems. Interventions: Use empathy, compassion, and support, allowing the client to tell in detail the story of his/her recent loss. Assign the client to make a list of all the regrets associated with actions toward or relationship with the deceased; process the list content toward resolution of these feelings. Assist the client in engaging in behaviors that celebrate the positive memorable aspects of the  loved one and his/her life (e.g., placing memoriam in newspaper on anniversary of death, volunteering time to a favorite cause of the deceased person). Ask the client to discuss and/or list the positive aspects of and memories about his/her relationship with the lost loved one; reinforce the client's expression of positive memories and emotions (e.g., smiling, laughing); encourage the client to share these thoughts with supportive loved ones. Educate the client on the stages of the grieving process and answer any questions he/she may have. Assist the client in identifying the stages of grief that he/she has experienced and which stage he/she is presently working through. Ask the client to bring pictures or mementos connected with his/her loss to a session and talk about them (or assign "Creating a [place]" in the Adult Psychotherapy Homework Planner by Stephannie Li). Assist the client in identifying and expressing feelings connected with his/her loss. Gently and sensitively explore the client's recollection of the facts of the traumatic incident and his/her cognitive and emotional reactions at the time; assess frequency, intensity, duration, and history of the client's PTSD symptoms and their impact on functioning (see "How the Trauma Affects Me" in the Adult  Psychotherapy Homework Planner by Jongsma); supplement with semi-structured assessment instrument if desired (see The Anxiety Disorders Interview Schedule - Adult Version). Using Cognitive Therapy techniques, explore the client's self-talk and beliefs about self, others, and the future that are a consequence of the trauma (e.g., themes of safety, trust, power, control, esteem, and intimacy); identify and challenge biases; assist him/her in generating appraisals that correct for the biases; test biased and alternatives predictions through behavioral experiments. Teach the client a guided self-dialogue procedure in which he/she learns to recognize maladaptive self-talk, challenges its biases, copes with engendered feelings, overcomes avoidance, and reinforces his/her accomplishments; review and reinforce progress, problem-solve obstacles. Utilize Eye Movement Desensitization and Reprocessing (EMDR) to reduce the client's emotional reactivity to the traumatic event and reduce PTSD symptoms. Assess the client for instances of poor anger management that have led to threats or actual violence that caused damage to property and/or injury to people (or assign "Anger Journal" in the Adult Psychotherapy Homework Planner by Stephannie Li). Teach the client anger management techniques (see the Anger Control Problems chapter in this Planner). Develop and encourage a routine of physical exercise for the client. Teach the client calming skills (e.g., breathing retraining, relaxation, calming self-talk) to use in and between sessions when feeling overly distressed. Ask the client to describe his/her past experiences of anxiety and their impact on functioning; assess the focus, excessiveness, and uncontrollability of the worry and the type, frequency, intensity, and duration of his/her anxiety symptoms (consider using a structured interview such as The Anxiety Disorders Interview Schedule-Adult Version). Explore the client's  schema and self-talk that mediate his/her fear response; assist him/her in challenging the biases; replace the distorted messages with reality-based alternatives and positive, realistic self-talk that will increase his/her self-confidence in coping with irrational fears (see Cognitive Therapy of Anxiety Disorders by Laurence Slate). Teach the client problem-solving strategies involving specifically defining a problem, generating options for addressing it, evaluating the pros and cons of each option, selecting and implementing an optional action, and reevaluating and refining the action (or assign "Applying Problem-Solving to Interpersonal Conflict" in the Adult Psychotherapy Homework Planner by Stephannie Li). Engage the client in behavioral activation, increasing the client's contact with sources of reward, identifying processes that inhibit activation, and teaching skills to solve life problems (or assign "Identify and Schedule Pleasant Activities" in the Adult Psychotherapy  Homework Planner by [employer]); use behavioral techniques such as instruction, rehearsal, role-playing, role reversal as needed to assist adoption into the client's daily life; reinforce success. Discuss with the client the distinction between a lapse and relapse, associating a lapse with an initial and reversible return of worry, anxiety symptoms, or urges to avoid, and relapse with the decision to continue the fearful and avoidant patterns. Identify and rehearse with the client the management of future situations or circumstances in which lapses could occur. Instruct the client to routinely use new therapeutic skills (e.g., relaxation, cognitive restructuring, exposure, and problem-solving) in daily life to address emergent worries, anxiety, and avoidant tendencies. Develop a "coping card" on which coping strategies and other important information (e.g., "Breathe deeply and relax," "Challenge unrealistic worries," "Use problem-solving") are  written for the client's later use. Use techniques from Acceptance and Commitment Therapy to help client accept uncomfortable realities such as lack of complete control, imperfections, and uncertainty and tolerate unpleasant emotions and thoughts in order to accomplish value-consistent goals. Discuss how generalized anxiety typically involves excessive worry about unrealistic threats, various bodily expressions of tension, overarousal, and hypervigilance, and avoidance of what is threatening that interact to maintain the problem (see Mastery of Your Anxiety and Worry: Therapist Guide by Renard Matter, and Barlow; Treating Generalized Anxiety Disorder by Rygh and Ida Rogue). Teach the client calming/relaxation skills (e.g., applied relaxation, progressive muscle relaxation, cue controlled relaxation; mindful breathing; biofeedback) and how to discriminate better between relaxation and tension; teach the client how to apply these skills to his/her daily life (e.g., New Directions in Progressive Muscle Relaxation by Marcelyn Ditty, and Hazlett-Stevens; Treating Generalized Anxiety Disorder by Rygh and Ida Rogue). Assign the client to read about progressive muscle relaxation and other calming strategies in relevant books or treatment manuals (e.g., Progressive Relaxation Training by Robb Matar and Alen Blew; Mastery of Your Anxiety and Worry: Workbook by Earlie Counts). Explain the rationale for using a worry time as well as how it is to be used; agree upon and implement a worry time with the client. Teach the client how to recognize, stop, and postpone worry to the agreed upon worry time using skills such as thought stopping, relaxation, and redirecting attention (or assign "Making Use of the Thought-Stopping Technique" and/or "Worry Time" in the Adult Psychotherapy Homework Planner by Jongsma to assist skill development); encourage use in daily life; review and reinforce success while providing  corrective feedback toward improvement. Assist the client in analyzing his/her worries by examining potential biases such as the probability of the negative expectation occurring, the real consequences of it occurring, his/her ability to control the outcome, the worst possible outcome, and his/her ability to accept it (see "Analyze the Probability of a Feared Event" in the Adult Psychotherapy Homework Planner by Stephannie Li; Cognitive Therapy of Anxiety Disorders by Laurence Slate). Encourage the client to share his/her thoughts and feelings of depression; express empathy and build rapport while identifying primary cognitive, behavioral, interpersonal, or other contributors to depression. Conduct Cognitive-Behavioral Therapy (see Cognitive Behavior Therapy by Reola Calkins; Overcoming Depression by Agapito Games al.), beginning with helping the client learn the connection among cognition, depressive feelings, and actions. Assign the client to self-monitor thoughts, feelings, and actions in daily journal (e.g., "Negative Thoughts Trigger Negative Feelings" in the Adult Psychotherapy Homework Planner by Stephannie Li; "Daily Record of Dysfunctional Thoughts" in Cognitive Therapy of Depression by Ashby Dawes and Shea Evans); process the journal material to challenge depressive thinking patterns and replace them with reality-based thoughts. Facilitate and reinforce the  client's shift from biased depressive self-talk and beliefs to reality-based cognitive messages that enhance self-confidence and increase adaptive actions (see "Positive Self-Talk" in the Adult Psychotherapy Homework Planner by Stephannie Li). Assist the client in developing skills that increase the likelihood of deriving pleasure from behavioral activation (e.g., assertiveness skills, developing an exercise plan, less internal/more external focus, increased social involvement); reinforce success. Conduct Interpersonal Therapy (see Interpersonal Psychotherapy of Depression  by Casimer Lanius al.), beginning with the assessment of the client's "interpersonal inventory" of important past and present relationships; develop a case formulation linking depression to grief, interpersonal role disputes, role transitions, and/or interpersonal deficits). Conduct Problem-Solving Therapy (see Problem-Solving Therapy by Domenick Bookbinder and Rob Hickman) using techniques such as psychoeducation, modeling, and role-playing to teach client problem-solving skills (i.e., defining a problem specifically, generating possible solutions, evaluating the pros and cons of each solution, selecting and implementing a plan of action, evaluating the efficacy of the plan, accepting or revising the plan); role-play application of the problem-solving skill to a real life issue (or assign "Applying Problem-Solving to Interpersonal Conflict" in the Adult Psychotherapy Homework Planner by Stephannie Li). Teach conflict resolution skills (e.g., empathy, active listening, "I messages," respectful communication, assertiveness without aggression, compromise); use psychoeducation, modeling, role-playing, and rehearsal to work through several current conflicts; assign homework exercises; review and repeat so as to integrate their use into the client's life. Consistent with the treatment model, discuss how cognitive, behavioral, interpersonal, and/or other factors (e.g., family history) contribute to depression.  Diagnosis:Generalized anxiety disorder  Current mild episode of major depressive disorder without prior episode [name])  Uncomplicated bereavement  Plan:  -create schedule and begin utilizing prior to next session -meet again on Wednesday, January 27, 2024 at 10am virtually.

## 2024-01-19 ENCOUNTER — Ambulatory Visit (INDEPENDENT_AMBULATORY_CARE_PROVIDER_SITE_OTHER): Payer: Medicare Other

## 2024-01-19 ENCOUNTER — Encounter (INDEPENDENT_AMBULATORY_CARE_PROVIDER_SITE_OTHER): Payer: Self-pay | Admitting: Nurse Practitioner

## 2024-01-19 ENCOUNTER — Ambulatory Visit (INDEPENDENT_AMBULATORY_CARE_PROVIDER_SITE_OTHER): Payer: Medicare Other | Admitting: Nurse Practitioner

## 2024-01-19 VITALS — BP 124/78 | HR 57 | Resp 18 | Wt 296.0 lb

## 2024-01-19 DIAGNOSIS — I872 Venous insufficiency (chronic) (peripheral): Secondary | ICD-10-CM | POA: Diagnosis not present

## 2024-01-19 DIAGNOSIS — I10 Essential (primary) hypertension: Secondary | ICD-10-CM | POA: Diagnosis not present

## 2024-01-19 DIAGNOSIS — I890 Lymphedema, not elsewhere classified: Secondary | ICD-10-CM

## 2024-01-19 DIAGNOSIS — M7989 Other specified soft tissue disorders: Secondary | ICD-10-CM

## 2024-01-21 ENCOUNTER — Encounter (INDEPENDENT_AMBULATORY_CARE_PROVIDER_SITE_OTHER): Payer: Self-pay | Admitting: Nurse Practitioner

## 2024-01-21 NOTE — Progress Notes (Signed)
Subjective:    Patient ID: Tom Underwood, male    DOB: 01-09-1951, 73 y.o.   MRN: [id number] Chief Complaint  Patient presents with   Follow-up    Pt conv ble reflux    ADOM SCHOENECK is a 73 y.o. male.  The patient returns today for evaluation of bilateral lower extremity edema.  The patient had a stroke with resultant right-sided weakness about 4 years ago.  Since that time, he has had progressively worsening swelling predominantly on the right side although he has now developed significant swelling on the left side as well.  His mobility was certainly impaired by this and remains impaired to this day.  He sits with his feet dependent much of the day.  He has been wearing his compression socks regularly and has noted that the swelling has notably decreased since he has been wearing them consistently.  He has no known history of DVT or superficial thrombophlebitis to his knowledge.  He does try to elevate his legs.  The legs never go back to normal size, but in the mornings they are not quite as swollen as they are as the day goes on.  No chest pain or shortness of breath.  No fevers or chills.  Today noninvasive studies show no evidence of DVT or superficial phlebitis bilaterally.  No evidence of deep venous insufficiency bilaterally.  There is superficial venous reflux noted in the right great saphenous vein.  No superficial reflux noted in the left    Review of Systems  Cardiovascular:  Positive for leg swelling.  Skin:  Negative for wound.  All other systems reviewed and are negative.      Objective:   Physical Exam Vitals reviewed.  HENT:     Head: Normocephalic.  Cardiovascular:     Rate and Rhythm: Normal rate.  Pulmonary:     Effort: Pulmonary effort is normal.  Musculoskeletal:     Right lower leg: Edema present.     Left lower leg: Edema present.  Skin:    General: Skin is warm and dry.  Neurological:     Mental Status: He is alert and oriented to person, place,  and time.  Psychiatric:        Mood and Affect: Mood normal.        Behavior: Behavior normal.        Thought Content: Thought content normal.        Judgment: Judgment normal.     BP 124/78   Pulse (!) 57   Resp 18   Wt 296 lb (134.3 kg)   BMI 42.47 kg/m   Past Medical History:  Diagnosis Date   Anxiety    Arthritis    Atrial fibrillation (HCC)    a.) CHA2DS2-VASc = 4 (age, HTN, CVA x2). b.) s/p 200 J synchronized cardioversion (DCCV) on 12/08/2021. c.) rate/rythum maintained on oral metoprolol tartrate; chronically anticoagulated using rivaroxaban.   BPH (benign prostatic hyperplasia)    DDD (degenerative disc disease), lumbar    Decreased libido    Depression    Diverticulitis    Diverticulosis    GERD (gastroesophageal reflux disease)    Hepatic steatosis    History of 2019 novel coronavirus disease (COVID-19) 12/28/2020   History of cardiac murmur in childhood    History of kidney stones    HLD (hyperlipidemia)    Hypertension    Hypogonadism in male    OSA on CPAP    Pre-diabetes    Rhinitis, allergic  Stroke (HCC) 07/23/2019   a.) LEFT posterior cor radiata. b.) residual RIGHT sided weakness    Social History   Socioeconomic History   Marital status: Married    Spouse name: [name]    Number of children: 2   Years of education: Not on file   Highest education level: Not on file  Occupational History   Occupation: retired   Tobacco Use   Smoking status: Former    Types: Pipe    Start date: 02/20/1973    Quit date: 02/20/1974    Years since quitting: 49.9   Smokeless tobacco: Never  Vaping Use   Vaping status: Never Used  Substance and Sexual Activity   Alcohol use: No    Alcohol/week: 0.0 standard drinks of alcohol   Drug use: No   Sexual activity: Yes    Partners: Female  Other Topics Concern   Not on file  Social History Narrative   Not on file   Social Drivers of Health   Financial Resource Strain: Low Risk  (05/26/2023)   Overall  Financial Resource Strain (CARDIA)    Difficulty of Paying Living Expenses: Not very hard  Food Insecurity: No Food Insecurity (05/26/2023)   Hunger Vital Sign    Worried About Running Out of Food in the Last Year: Never true    Ran Out of Food in the Last Year: Never true  Transportation Needs: No Transportation Needs (05/26/2023)   PRAPARE - Administrator, Civil Service (Medical): No    Lack of Transportation (Non-Medical): No  Physical Activity: Insufficiently Active (05/26/2023)   Exercise Vital Sign    Days of Exercise per Week: 3 days    Minutes of Exercise per Session: 30 min  Stress: No Stress Concern Present (05/26/2023)   [employer] of Occupational Health - Occupational Stress Questionnaire    Feeling of Stress : Not at all  Social Connections: Socially Integrated (05/26/2023)   Social Connection and Isolation Panel [NHANES]    Frequency of Communication with Friends and Family: More than three times a week    Frequency of Social Gatherings with Friends and Family: Twice a week    Attends Religious Services: More than 4 times per year    Active Member of [employer] or Organizations: Yes    Attends Banker Meetings: More than 4 times per year    Marital Status: Married  Catering manager Violence: Not At Risk (05/26/2023)   Humiliation, Afraid, Rape, and Kick questionnaire    Fear of Current or Ex-Partner: No    Emotionally Abused: No    Physically Abused: No    Sexually Abused: No    Past Surgical History:  Procedure Laterality Date   CARDIOVERSION N/A 12/08/2021   Procedure: CARDIOVERSION;  Surgeon: Yvonne Kendall, MD;  Location: [facility] ORS;  Service: Cardiovascular;  Laterality: N/A;   COLONOSCOPY     COLONOSCOPY WITH PROPOFOL N/A 10/06/2023   Procedure: COLONOSCOPY WITH PROPOFOL;  Surgeon: Wyline Mood, MD;  Location: [facility] ENDOSCOPY;  Service: Gastroenterology;  Laterality: N/A;   EVALUATION UNDER ANESTHESIA WITH HEMORRHOIDECTOMY N/A 04/15/2022    Procedure: EXAM UNDER ANESTHESIA WITH HEMORRHOIDECTOMY;  Surgeon: Sung Amabile, DO;  Location: [facility] ORS;  Service: General;  Laterality: N/A;   EXTERNAL EAR SURGERY     EYE SURGERY     FINGER SURGERY Right    lateration to 5 th digit   KNEE SURGERY     left eye     POLYPECTOMY  10/06/2023   Procedure:  POLYPECTOMY;  Surgeon: Wyline Mood, MD;  Location: [facility] ENDOSCOPY;  Service: Gastroenterology;;   RECTAL EXAM UNDER ANESTHESIA N/A 04/23/2022   Procedure: RECTAL EXAM UNDER ANESTHESIA WITH LIGATION OF BLEEDING;  Surgeon: Henrene Dodge, MD;  Location: [facility] ORS;  Service: General;  Laterality: N/A;    Family History  Problem Relation Age of Onset   Asthma Mother    Heart disease Mother    Heart attack Mother    Dementia Mother    Asthma Father    Heart disease Father    Stroke Father     Allergies  Allergen Reactions   Penicillins Itching       Latest Ref Rng & Units 06/30/2023   11:38 AM 07/01/2022   11:18 AM 04/26/2022    9:40 AM  CBC  WBC 3.4 - 10.8 x10E3/uL 5.9  5.2  8.1   Hemoglobin 13.0 - 17.7 g/dL 53.6  64.4  03.4   Hematocrit 37.5 - 51.0 % 46.3  42.1  31.4   Platelets 150 - 450 x10E3/uL 205  219  252       CMP     Component Value Date/Time   NA 142 06/30/2023 1138   K 3.8 06/30/2023 1138   CL 102 06/30/2023 1138   CO2 23 06/30/2023 1138   GLUCOSE 100 (H) 06/30/2023 1138   GLUCOSE 139 (H) 04/24/2022 0517   BUN 12 06/30/2023 1138   CREATININE 1.08 06/30/2023 1138   CALCIUM 9.7 06/30/2023 1138   PROT 7.2 06/30/2023 1138   ALBUMIN 4.7 06/30/2023 1138   AST 24 06/30/2023 1138   ALT 23 06/30/2023 1138   ALKPHOS 122 (H) 06/30/2023 1138   BILITOT 1.3 (H) 06/30/2023 1138   EGFR 74 06/30/2023 1138   GFRNONAA >60 04/24/2022 0517     No results found.     Assessment & Plan:   1. Lymphedema (Primary) Recommend:  No surgery or intervention at this point in time.   The Patient is CEAP [id number].  The patient has been wearing compression for more than 12  weeks with no or little benefit.  The patient has been exercising daily for more than 12 weeks. The patient has been elevating and taking OTC pain medications for more than 12 weeks.  None of these have have eliminated the pain related to the lymphedema or the discomfort regarding excessive swelling and venous congestion.    I have reviewed my discussion with the patient regarding lymphedema and why it  causes symptoms.  Patient will continue wearing graduated compression on a daily basis. The patient should put the compression on first thing in the morning and removing them in the evening. The patient should not sleep in the compression.   In addition, behavioral modification throughout the day will be continued.  This will include frequent elevation (such as in a recliner), use of over the counter pain medications as needed and exercise such as walking.  The systemic causes for chronic edema such as liver, kidney and cardiac etiologies do not appear to have significant changed over the past year.    The patient has chronic , severe lymphedema with hyperpigmentation of the skin and has done MLD, skin care, medication, diet, exercise, elevation and compression for 4 weeks with no improvement,  I am recommending a lymphedema pump.  The patient still has stage 3 lymphedema and therefore, I believe that a lymph pump is needed to improve the control of the patient's lymphedema and improve the quality of life.  Additionally, a  lymph pump is warranted because it will reduce the risk of cellulitis and ulceration in the future.  Patient should follow-up in six months   2. Chronic venous insufficiency The patient does have some evidence of venous reflux in the right lower extremity.  However because he does have swelling bilaterally, I do feel he has an underlying issue with lymphedema.  Will move forward with the lymphedema pump as noted above and how this also controls his swelling.  If he continues to have  issues, especially in the right lower extremity we may revisit the idea of an endovenous laser ablation.  3. Primary hypertension Continue antihypertensive medications as already ordered, these medications have been reviewed and there are no changes at this time.   Current Outpatient Medications on File Prior to Visit  Medication Sig Dispense Refill   aspirin EC 81 MG tablet Take 1 tablet (81 mg total) by mouth daily. Swallow whole. 90 tablet 3   buPROPion (WELLBUTRIN XL) 300 MG 24 hr tablet Take 1 tablet (300 mg total) by mouth every morning. 90 tablet 1   busPIRone (BUSPAR) 10 MG tablet Take 1 tablet (10 mg total) by mouth 2 (two) times daily. 180 tablet 1   Cholecalciferol (VITAMIN D) 125 MCG (5000 UT) CAPS Take 5,000 Units by mouth daily.     finasteride (PROSCAR) 5 MG tablet Take 1 tablet (5 mg total) by mouth daily. 90 tablet 3   fluticasone (FLONASE) 50 MCG/ACT nasal spray SPRAY 2 SPRAYS INTO EACH NOSTRIL EVERY DAY 48 mL 0   furosemide (LASIX) 20 MG tablet Take 1 tablet (20 mg total) by mouth as needed. 90 tablet 3   GLUCOSAMINE-CHONDROITIN PO Take 2 tablets by mouth in the morning and at bedtime.     ketoconazole (NIZORAL) 2 % shampoo Apply topically.     lidocaine (XYLOCAINE) 5 % ointment Apply 1 application. topically 3 (three) times daily as needed. Apply pea size amount to area as needed 35.44 g 0   metoprolol tartrate (LOPRESSOR) 25 MG tablet Take 1 tablet (25 mg total) by mouth 2 (two) times daily. 180 tablet 3   Multiple Vitamin (MULTIVITAMIN) tablet Take 1 tablet by mouth daily.     omega-3 acid ethyl esters (LOVAZA) 1 g capsule Take 1 capsule (1 g total) by mouth 2 (two) times daily. 360 capsule 1   pantoprazole (PROTONIX) 20 MG tablet Take 1 tablet (20 mg total) by mouth daily. 90 tablet 1   rosuvastatin (CRESTOR) 20 MG tablet Take 1 tablet (20 mg total) by mouth daily. 90 tablet 3   Saccharomyces boulardii (PROBIOTIC) 250 MG CAPS Take 1 capsule by mouth daily. 30 capsule 0    tamsulosin (FLOMAX) 0.4 MG CAPS capsule Take 1 capsule (0.4 mg total) by mouth daily. 90 capsule 1   valsartan (DIOVAN) 80 MG tablet Take 1 tablet (80 mg total) by mouth daily. 90 tablet 3   VISINE DRY EYE RELIEF 1 % SOLN Place 1 drop into both eyes daily as needed (dry eyes).     No current facility-administered medications on file prior to visit.    There are no Patient Instructions on file for this visit. No follow-ups on file.   Georgiana Spinner, NP

## 2024-01-23 ENCOUNTER — Other Ambulatory Visit: Payer: Self-pay | Admitting: Family Medicine

## 2024-01-23 DIAGNOSIS — E785 Hyperlipidemia, unspecified: Secondary | ICD-10-CM

## 2024-01-23 NOTE — Telephone Encounter (Unsigned)
Copied from CRM [id number]. Topic: General - Other >> Jan 23, 2024  5:35 PM Everette C wrote: Reason for CRM: Medication Refill - Most Recent Primary Care Visit:  Provider: Alba Cory Department: [facility] Visit Type: OFFICE VISIT Date: 11/22/2023  Medication: omega-3 acid ethyl esters (LOVAZA) 1 g capsule [[id number]]  Has the patient contacted their pharmacy? Yes (Agent: If no, request that the patient contact the pharmacy for the refill. If patient does not wish to contact the pharmacy document the reason why and proceed with request.) (Agent: If yes, when and what did the pharmacy advise?)  Is this the correct pharmacy for this prescription? Yes If no, delete pharmacy and type the correct one.  This is the patient's preferred pharmacy:  [facility] - [place], [place] - [address] [place] [address] Phone: [phone] Fax: [phone]   Has the prescription been filled recently? Yes  Is the patient out of the medication? No  Has the patient been seen for an appointment in the last year OR does the patient have an upcoming appointment? Yes  Can we respond through MyChart? No  Agent: Please be advised that Rx refills may take up to 3 business days. We ask that you follow-up with your pharmacy.

## 2024-01-24 ENCOUNTER — Ambulatory Visit: Payer: PRIVATE HEALTH INSURANCE | Admitting: Professional

## 2024-01-24 MED ORDER — OMEGA-3-ACID ETHYL ESTERS 1 G PO CAPS: 1.0000 g | ORAL_CAPSULE | Freq: Two times a day (BID) | ORAL | 1 refills | Status: DC

## 2024-01-24 NOTE — Telephone Encounter (Signed)
Requested Prescriptions  Pending Prescriptions Disp Refills   omega-3 acid ethyl esters (LOVAZA) 1 g capsule 360 capsule 1    Sig: Take 1 capsule (1 g total) by mouth 2 (two) times daily.     Endocrinology:  Nutritional Agents - omega-3 acid ethyl esters Failed - 01/24/2024  2:12 PM      Failed - Lipid Panel in normal range within the last 12 months    Cholesterol, Total  Date Value Ref Range Status  06/30/2023 120 100 - 199 mg/dL Final   LDL Chol Calc (NIH)  Date Value Ref Range Status  06/30/2023 51 0 - 99 mg/dL Final   HDL  Date Value Ref Range Status  06/30/2023 51 >39 mg/dL Final   Triglycerides  Date Value Ref Range Status  06/30/2023 96 0 - 149 mg/dL Final         Passed - Valid encounter within last 12 months    Recent Outpatient Visits           2 months ago Hemiparesis of right dominant side as late effect of cerebral infarction [facility])   [place] [facility] Alba Cory, MD   8 months ago Morbid obesity [facility])   [place] [facility] Alba Cory, MD   11 months ago Persistent atrial fibrillation [facility])   [place] [facility] Alba Cory, MD   1 year ago Paroxysmal A-fib [facility])    [facility] Alba Cory, MD   1 year ago Need for immunization against influenza   [facility] Alba Cory, MD       Future Appointments             In 4 months Carlynn Purl, Danna Hefty, MD [facility]

## 2024-01-26 ENCOUNTER — Ambulatory Visit: Payer: Medicare Other | Attending: Family Medicine | Admitting: Physical Therapy

## 2024-01-28 ENCOUNTER — Other Ambulatory Visit: Payer: Self-pay | Admitting: Family Medicine

## 2024-01-28 DIAGNOSIS — F341 Dysthymic disorder: Secondary | ICD-10-CM

## 2024-01-29 ENCOUNTER — Encounter: Payer: Self-pay | Admitting: Family Medicine

## 2024-01-29 DIAGNOSIS — D225 Melanocytic nevi of trunk: Secondary | ICD-10-CM | POA: Diagnosis not present

## 2024-01-29 DIAGNOSIS — D235 Other benign neoplasm of skin of trunk: Secondary | ICD-10-CM | POA: Diagnosis not present

## 2024-01-30 ENCOUNTER — Other Ambulatory Visit: Payer: Self-pay | Admitting: Family Medicine

## 2024-01-30 DIAGNOSIS — E785 Hyperlipidemia, unspecified: Secondary | ICD-10-CM

## 2024-01-30 MED ORDER — OMEGA-3-ACID ETHYL ESTERS 1 G PO CAPS: 2.0000 g | ORAL_CAPSULE | Freq: Two times a day (BID) | ORAL | 1 refills | Status: DC

## 2024-01-31 ENCOUNTER — Encounter: Payer: Self-pay | Admitting: Professional

## 2024-01-31 ENCOUNTER — Ambulatory Visit: Payer: Medicare Other | Admitting: Professional

## 2024-01-31 DIAGNOSIS — Z634 Disappearance and death of family member: Secondary | ICD-10-CM | POA: Diagnosis not present

## 2024-01-31 DIAGNOSIS — F320 Major depressive disorder, single episode, mild: Secondary | ICD-10-CM

## 2024-01-31 DIAGNOSIS — F411 Generalized anxiety disorder: Secondary | ICD-10-CM | POA: Diagnosis not present
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
## 2024-02-06 ENCOUNTER — Encounter (INDEPENDENT_AMBULATORY_CARE_PROVIDER_SITE_OTHER): Payer: Self-pay | Admitting: Vascular Surgery

## 2024-02-06 ENCOUNTER — Encounter: Payer: Self-pay | Admitting: Physical Therapy

## 2024-02-06 ENCOUNTER — Ambulatory Visit: Payer: Medicare Other | Attending: Family Medicine | Admitting: Physical Therapy

## 2024-02-06 ENCOUNTER — Encounter: Payer: Self-pay | Admitting: Family Medicine

## 2024-02-06 DIAGNOSIS — R262 Difficulty in walking, not elsewhere classified: Secondary | ICD-10-CM | POA: Diagnosis not present

## 2024-02-06 DIAGNOSIS — M6281 Muscle weakness (generalized): Secondary | ICD-10-CM | POA: Insufficient documentation

## 2024-02-06 DIAGNOSIS — R2681 Unsteadiness on feet: Secondary | ICD-10-CM | POA: Insufficient documentation

## 2024-02-06 DIAGNOSIS — R279 Unspecified lack of coordination: Secondary | ICD-10-CM | POA: Insufficient documentation

## 2024-02-06 DIAGNOSIS — R269 Unspecified abnormalities of gait and mobility: Secondary | ICD-10-CM | POA: Diagnosis not present

## 2024-02-06 NOTE — Therapy (Signed)
 OUTPATIENT PHYSICAL THERAPY TREATMENT NOTE/ RECERTIFICATION/ DISCHARGE  Patient Name: Tom Underwood MRN: [id number] DOB:1951-06-29, 73 y.o., male Today's Date: 02/06/2024  PCP: Alba Cory, MD REFERRING PROVIDER: Caro Laroche, DO    PT End of Session - 02/06/24 1341     Visit Number 66    Number of Visits 66    Date for PT Re-Evaluation 02/06/24    PT Start Time 1341    PT Stop Time 1455    PT Time Calculation (min) 74 min    Equipment Utilized During Treatment Gait belt    Activity Tolerance Patient limited by fatigue    Behavior During Therapy WFL for tasks assessed/performed              Past Medical History:  Diagnosis Date   Anxiety    Arthritis    Atrial fibrillation (HCC)    a.) CHA2DS2-VASc = 4 (age, HTN, CVA x2). b.) s/p 200 J synchronized cardioversion (DCCV) on 12/08/2021. c.) rate/rythum maintained on oral metoprolol tartrate; chronically anticoagulated using rivaroxaban.   BPH (benign prostatic hyperplasia)    DDD (degenerative disc disease), lumbar    Decreased libido    Depression    Diverticulitis    Diverticulosis    GERD (gastroesophageal reflux disease)    Hepatic steatosis    History of 2019 novel coronavirus disease (COVID-19) 12/28/2020   History of cardiac murmur in childhood    History of kidney stones    HLD (hyperlipidemia)    Hypertension    Hypogonadism in male    OSA on CPAP    Pre-diabetes    Rhinitis, allergic    Stroke (HCC) 07/23/2019   a.) LEFT posterior cor radiata. b.) residual RIGHT sided weakness   Past Surgical History:  Procedure Laterality Date   CARDIOVERSION N/A 12/08/2021   Procedure: CARDIOVERSION;  Surgeon: Yvonne Kendall, MD;  Location: [facility] ORS;  Service: Cardiovascular;  Laterality: N/A;   COLONOSCOPY     COLONOSCOPY WITH PROPOFOL N/A 10/06/2023   Procedure: COLONOSCOPY WITH PROPOFOL;  Surgeon: Wyline Mood, MD;  Location: [facility] ENDOSCOPY;  Service: Gastroenterology;  Laterality: N/A;    EVALUATION UNDER ANESTHESIA WITH HEMORRHOIDECTOMY N/A 04/15/2022   Procedure: EXAM UNDER ANESTHESIA WITH HEMORRHOIDECTOMY;  Surgeon: Sung Amabile, DO;  Location: [facility] ORS;  Service: General;  Laterality: N/A;   EXTERNAL EAR SURGERY     EYE SURGERY     FINGER SURGERY Right    lateration to 5 th digit   KNEE SURGERY     left eye     POLYPECTOMY  10/06/2023   Procedure: POLYPECTOMY;  Surgeon: Wyline Mood, MD;  Location: [facility] ENDOSCOPY;  Service: Gastroenterology;;   RECTAL EXAM UNDER ANESTHESIA N/A 04/23/2022   Procedure: RECTAL EXAM UNDER ANESTHESIA WITH LIGATION OF BLEEDING;  Surgeon: Henrene Dodge, MD;  Location: [facility] ORS;  Service: General;  Laterality: N/A;   Patient Active Problem List   Diagnosis Date Noted   History of atrial fibrillation 11/22/2023   Major depression, recurrent, chronic (HCC) 11/22/2023   Adenomatous polyp of colon 10/06/2023   Gastroesophageal reflux disease without esophagitis 05/17/2022   History of syncope 05/17/2022   History of hemorrhoidectomy 05/17/2022   Vitamin D deficiency 05/17/2022   Lower extremity edema 05/17/2022   History of rectal bleeding 05/17/2022   Morbid obesity (HCC)    Hypertension    Hemiparesis of right dominant side as late effect of cerebral infarction (HCC) 08/13/2019   History of kidney stones 05/02/2018   ED (erectile dysfunction) 05/02/2018  Hyperglycemia 01/30/2017   Arthritis of knee, degenerative 01/30/2017   Benign prostatic hyperplasia without lower urinary tract symptoms 02/03/2016   Dyslipidemia 08/04/2015   OSA on CPAP 06/30/2015    REFERRING DIAG: M25.511,M25.512 (ICD-10-CM) - Bilateral shoulder pain, unspecified chronicity M17.2 (ICD-10-CM) - Post-traumatic osteoarthritis of both knees  R26.81 (ICD-10-CM) - Gait instability   THERAPY DIAG:  Muscle weakness (generalized)   Unsteadiness on feet   Abnormality of gait and mobility   Unspecified lack of coordination   Other abnormalities of gait and mobility    Difficulty in walking, not elsewhere classified  PERTINENT HISTORY: See evaluation  PRECAUTIONS: Fall  10/30:  : 460 feet; vitals post : 96% O2, 58 bpm, 132/49 sitting BP  TUG: 22.05 seconds with SPC  DGI: 15/24  SUBJECTIVE:  02/06/2024  Pt. Entered PT prepared and ready for session.  Pt. C/o 3/10 B knee pain and wearing B lower leg compression stockings.  Pt. C/o R hip discomfort walking into PT clinic with use of SPC.  Pt. States he may be getting lymphedema pumps for LE swelling.  No falls or stumbles.    TODAY'S TREATMENT:                 There ex.:   Reviewed HEP (see handouts)- RTB ex.                 Nustep L6 for 15 mins. B UE/LE.  Seat position #10/ arm position #10.  0.67  miles.      Nautilus: standing lat. Pull downs 60# 22x (modified shoulder flexion secondary to recent dermatology surgery on upper back/ chest).     Walking in clinic with use of SPC/ carrying SPC with cuing to increase step pattern and upright posture.      PT reviewed pts. Daily activity/ HEP/ use of compression stockings.  Discussed importance of walking/ HEP with pts. Wife.     Standing marching/ hip abduction in //-bars 20x.               PATIENT EDUCATION: Education details: HEP/ daily activity Person educated: Patient Education method: Medical illustrator Education comprehension: verbalized understanding and returned demonstration  HOME EXERCISE PROGRAM: Access Code: [email or web address] URL: [email or web address] Date: 02/06/2024 Prepared by: Dorene Grebe  Exercises - Heel Raises with Counter Support  - 1 x daily - 5 x weekly - 2 sets - 15 reps - Standing March with Counter Support  - 1 x daily - 5 x weekly - 2 sets - 15 reps - Side Stepping with Counter Support  - 1 x daily - 5 x weekly - 2 sets - 15 reps - Mini Squat with Counter Support  - 1 x daily - 5 x weekly - 2 sets - 15 reps - Standing shoulder flexion wall slides  - 1 x daily - 5 x weekly - 2 sets -  15 reps - Shoulder W - External Rotation with Resistance  - 1 x daily - 4 x weekly - 2 sets - 15 reps - Seated Shoulder Horizontal Abduction with Resistance - Palms Down  - 1 x daily - 4 x weekly - 2 sets - 15 reps - Standing Shoulder Flexion with Resistance  - 1 x daily - 4 x weekly - 2 sets - 15 reps   PT Long Term Goals - 02/06/24      PT LONG TERM GOAL #1   Title Pt. Able to complete 10 steps with recip. Pattern and 1 handrail assist with  mod. Independence safely.    Baseline Pt. Limited with R hip flexion/ recip. Gait pattern and requires heavy UE assist.  Pt. Has ramp at home with stairs at side   Time 1   Period Week    Status Goal met   Target Date 02/06/24     PT LONG TERM GOAL #2   Title Pt wil improve TUG score to < 12 sec with no AD to display significant imporvement in reduced risk of falls    Baseline See above   Time 8   Period Weeks    Status Goal met   Target Date 11/08/2023     PT LONG TERM GOAL #3   Title Pt will improve DGI to >19/24 to indicate decreased risk of falls with household and community walking tasks    Baseline 11/23: 11/24 1/19:12/24 8/16: 14/24, 10/30: 15/24   Time 8    Period Weeks    Status Partially Met    Target Date 01/03/2024     PT LONG TERM GOAL #4   Title Pt. Will increase R hip flexion/ LE muscle strength 1/2 muscle grade to improve standing tolerance/ walking endurance.    Baseline See above.  B hip flexion 4+/5 MMT (good control on R hip today), 10/30: see for updated scores   Time 8    Period Weeks    Status Partially Met    Target Date 02/06/24         Plan -     Clinical Impression Statement Pt. Arrived to PT with B lower leg compression stockings and use of SPC/ step to gait pattern.  B knee discomfort with standing/ walking tasks but pain remained <4/10 t/o tx. Session.  PT tx. Focused on generalized strengthening/ endurance ex. And PT issued new HEP.  Pt. Will be discharged from PT at this time with focus on a daily walking/  HEP.  Pt. Instructed to contact PT if any questions or regression in symptoms.   Discharge from PT at this time.     Personal Factors and Comorbidities Comorbidity 2    Comorbidities Hypertension, Obesity    Examination-Activity Limitations Bed Mobility;Reach Overhead;Squat;Lift;Dressing;Hygiene/Grooming    Examination-Participation Restrictions Community Activity;Yard Work    Conservation officer, historic buildings Evolving/Moderate complexity    Clinical Decision Making Moderate    Rehab Potential Fair    PT Frequency 1x / week    PT Duration     PT Treatment/Interventions ADLs/Self Care Home Management;Gait training;Stair training;Functional mobility training;Therapeutic activities;Therapeutic exercise;Balance training;Neuromuscular re-education;Manual techniques;Patient/family education;Electrical Stimulation;Moist Heat;Cryotherapy        PT Home Exercise Plan Discharge to HEP   Consulted and Agree with Plan of Care Patient         Cammie Mcgee, PT, DPT # [phone] 2:56 PM,02/06/24         Today's Date: 02/06/2024  PCP: Alba Cory, MD REFERRING PROVIDER: Caro Laroche, DO    PT End of Session - 02/06/24 1341     Visit Number 66    Number of Visits 66    Date for PT Re-Evaluation 02/06/24    PT Start Time 1341    PT Stop Time 1455    PT Time Calculation (min) 74 min    Equipment Utilized During Treatment Gait belt    Activity Tolerance Patient limited by fatigue    Behavior During Therapy [facility] for tasks assessed/performed              Past Medical History:  Diagnosis Date  Anxiety    Arthritis    Atrial fibrillation (HCC)    a.) CHA2DS2-VASc = 4 (age, HTN, CVA x2). b.) s/p 200 J synchronized cardioversion (DCCV) on 12/08/2021. c.) rate/rythum maintained on oral metoprolol tartrate; chronically anticoagulated using rivaroxaban.   BPH (benign prostatic hyperplasia)    DDD (degenerative disc disease), lumbar    Decreased libido    Depression     Diverticulitis    Diverticulosis    GERD (gastroesophageal reflux disease)    Hepatic steatosis    History of 2019 novel coronavirus disease (COVID-19) 12/28/2020   History of cardiac murmur in childhood    History of kidney stones    HLD (hyperlipidemia)    Hypertension    Hypogonadism in male    OSA on CPAP    Pre-diabetes    Rhinitis, allergic    Stroke (HCC) 07/23/2019   a.) LEFT posterior cor radiata. b.) residual RIGHT sided weakness   Past Surgical History:  Procedure Laterality Date   CARDIOVERSION N/A 12/08/2021   Procedure: CARDIOVERSION;  Surgeon: Yvonne Kendall, MD;  Location: [facility] ORS;  Service: Cardiovascular;  Laterality: N/A;   COLONOSCOPY     COLONOSCOPY WITH PROPOFOL N/A 10/06/2023   Procedure: COLONOSCOPY WITH PROPOFOL;  Surgeon: Wyline Mood, MD;  Location: [facility] ENDOSCOPY;  Service: Gastroenterology;  Laterality: N/A;   EVALUATION UNDER ANESTHESIA WITH HEMORRHOIDECTOMY N/A 04/15/2022   Procedure: EXAM UNDER ANESTHESIA WITH HEMORRHOIDECTOMY;  Surgeon: Sung Amabile, DO;  Location: [facility] ORS;  Service: General;  Laterality: N/A;   EXTERNAL EAR SURGERY     EYE SURGERY     FINGER SURGERY Right    lateration to 5 th digit   KNEE SURGERY     left eye     POLYPECTOMY  10/06/2023   Procedure: POLYPECTOMY;  Surgeon: Wyline Mood, MD;  Location: [facility] ENDOSCOPY;  Service: Gastroenterology;;   RECTAL EXAM UNDER ANESTHESIA N/A 04/23/2022   Procedure: RECTAL EXAM UNDER ANESTHESIA WITH LIGATION OF BLEEDING;  Surgeon: Henrene Dodge, MD;  Location: [facility] ORS;  Service: General;  Laterality: N/A;   Patient Active Problem List   Diagnosis Date Noted   History of atrial fibrillation 11/22/2023   Major depression, recurrent, chronic (HCC) 11/22/2023   Adenomatous polyp of colon 10/06/2023   Gastroesophageal reflux disease without esophagitis 05/17/2022   History of syncope 05/17/2022   History of hemorrhoidectomy 05/17/2022   Vitamin D deficiency 05/17/2022   Lower extremity  edema 05/17/2022   History of rectal bleeding 05/17/2022   Morbid obesity (HCC)    Hypertension    Hemiparesis of right dominant side as late effect of cerebral infarction (HCC) 08/13/2019   History of kidney stones 05/02/2018   ED (erectile dysfunction) 05/02/2018   Hyperglycemia 01/30/2017   Arthritis of knee, degenerative 01/30/2017   Benign prostatic hyperplasia without lower urinary tract symptoms 02/03/2016   Dyslipidemia 08/04/2015   OSA on CPAP 06/30/2015    REFERRING DIAG: M25.511,M25.512 (ICD-10-CM) - Bilateral shoulder pain, unspecified chronicity M17.2 (ICD-10-CM) - Post-traumatic osteoarthritis of both knees  R26.81 (ICD-10-CM) - Gait instability   THERAPY DIAG:  Muscle weakness (generalized)   Unsteadiness on feet   Abnormality of gait and mobility   Unspecified lack of coordination   Other abnormalities of gait and mobility   Difficulty in walking, not elsewhere classified  PERTINENT HISTORY: See evaluation  PRECAUTIONS: Fall  10/30:  : 460 feet; vitals post : 96% O2, 58 bpm, 132/49 sitting BP  TUG: 22.05 seconds with SPC  DGI: 15/24  SUBJECTIVE:  02/06/2024  Pt. Entered PT with B lower leg compression stockings.  Pt. Reports benefit from stockings with daily tasks.  Pt. Reports 3/10 B knee discomfort with standing and walking activities.  Pt. Has not been seen by PT over past month secondary pt. Out of town and holidays.      TODAY'S TREATMENT:                 Sitting BP at middle of session: 97% O2, 62 bpm    There ex.:               Nustep L6 for 15 mins. B UE/LE.  Seat position #10/ arm position #10.  0.67  miles.     Nautilus: standing lat. Pull downs 60# 22x/ seated lat. Pull downs 60# 22x.     TRX squats 24x2, Chair behind patient for cue on squat depth. RPE 6/10 and NPS 3/10 (knees)  PT reviewed pts. Daily activity/ HEP/ use of compression stockings.  Several seated rest breaks during there.ex. secondary to fatigue/ SOB.  PT  monitored pts. Vitals t/o tx. Session.    NOT TODAY:   -  Standing marching/ hip abduction in //-bars 20x. - Walking in //-bars with focus on consistent recip. Hip flexion/ heel strike.  Added use of hurdles and cuing to prevent hip circumduction.     PATIENT EDUCATION: Education details: HEP/ daily activity Person educated: Patient Education method: Medical illustrator Education comprehension: verbalized understanding and returned demonstration  HOME EXERCISE PROGRAM: Handouts given for shoulder ROM/ strengthening/ standing hip ex.    PT Short Term Goals -  12/06/23      PT SHORT TERM GOAL #1   Title Pt able to complete and ambulate >800 feet with no assistive device safely.     Baseline See above   Time 4    Period Weeks    Status Partially met          PT Long Term Goals - 01/03/24      PT LONG TERM GOAL #1   Title Pt. Able to complete 10 steps with recip. Pattern and 1 handrail assist with mod. Independence safely.    Baseline Pt. Limited with R hip flexion/ recip. Gait pattern and requires heavy UE assist.  Pt. Has ramp at home with stairs at side   Time 8   Period Weeks    Status Partially met   Target Date 01/03/24     PT LONG TERM GOAL #2   Title Pt wil improve TUG score to < 12 sec with no AD to display significant imporvement in reduced risk of falls    Baseline See above   Time 8   Period Weeks    Status Goal met   Target Date 11/08/2023     PT LONG TERM GOAL #3   Title Pt will improve DGI to >19/24 to indicate decreased risk of falls with household and community walking tasks    Baseline 11/23: 11/24 1/19:12/24 8/16: 14/24, 10/30: 15/24   Time 8    Period Weeks    Status Partially Met    Target Date 01/03/2024     PT LONG TERM GOAL #4   Title Pt. Will increase R hip flexion/ LE muscle strength 1/2 muscle grade to improve standing tolerance/ walking endurance.    Baseline See above.  B hip flexion 4+/5 MMT (good control on R hip today),  10/30: see for updated scores   Time 8    Period Weeks  Status Partially Met    Target Date 01/03/2024         Plan -     Clinical Impression Statement Pt. Arrived to PT with B lower leg compression stockings and use of SPC/ step to gait pattern.  B knee discomfort with standing/ walking tasks but pain remained <5/10 t/o tx. Session.  PT tx. Focused on generalized strengthening/ endurance ex.  Pt education about RPE scale to prevent fatigue and adverese affect on Vitals.  Patient will continue to benefit from skilled PT services to progress strength and mobility, along with cardiovascular endurance with HEP and reducing frequencies in next two months as mentioned above.      Personal Factors and Comorbidities Comorbidity 2    Comorbidities Hypertension, Obesity    Examination-Activity Limitations Bed Mobility;Reach Overhead;Squat;Lift;Dressing;Hygiene/Grooming    Examination-Participation Restrictions Community Activity;Yard Work    Conservation officer, historic buildings Evolving/Moderate complexity    Clinical Decision Making Moderate    Rehab Potential Fair    PT Frequency 1x / week    PT Duration 8 weeks    PT Treatment/Interventions ADLs/Self Care Home Management;Gait training;Stair training;Functional mobility training;Therapeutic activities;Therapeutic exercise;Balance training;Neuromuscular re-education;Manual techniques;Patient/family education;Electrical Stimulation;Moist Heat;Cryotherapy    PT POC CHECK BP   PT Home Exercise Plan Progress endurance training to improve functional activity, follow-up on RPE scale (6-7/10).   Consulted and Agree with Plan of Care Patient         Cammie Mcgee, PT, DPT # [phone] 2:56 PM,02/06/24

## 2024-02-07 ENCOUNTER — Ambulatory Visit: Payer: PRIVATE HEALTH INSURANCE | Admitting: Professional

## 2024-02-14 ENCOUNTER — Ambulatory Visit: Payer: PRIVATE HEALTH INSURANCE | Admitting: Professional

## 2024-02-14 ENCOUNTER — Encounter: Payer: Self-pay | Admitting: Professional

## 2024-02-14 DIAGNOSIS — F320 Major depressive disorder, single episode, mild: Secondary | ICD-10-CM | POA: Diagnosis not present

## 2024-02-14 DIAGNOSIS — F411 Generalized anxiety disorder: Secondary | ICD-10-CM | POA: Diagnosis not present

## 2024-02-14 DIAGNOSIS — Z634 Disappearance and death of family member: Secondary | ICD-10-CM

## 2024-02-14 NOTE — Progress Notes (Signed)
 [facility] Counselor/Therapist Progress Note  Patient ID: Tom Underwood, MRN: [id number],    Date: 02/14/2024  Time Spent: 29 minutes 1027-1056am   Treatment Type: Individual Therapy  Risk Assessment: Danger to Self:  No Self-injurious Behavior: No Danger to Others: No Duty to Warn:no  Subjective: This session was held via video teletherapy. The patient consented to video teletherapy and was located at his home during this session. He is aware it is the responsibility of the patient to secure confidentiality on her end of the session. The provider was in a private home office for the duration of this session.    The patient arrived late for his Caregility appointment.   Issues addressed: 1-homework a-try and do something different to feel good about himself- -work on PT and doing the exercises- is doing his PT at home, using his stretching bands and is doing daily 2-physical a-he admits that the PT on his own felt hard -using the legging exercises of marching and legs (with bands) side to side -he did not feel as steady on his legs last evening after his feet went to sleep   -it only took him a few moments to recover -he had been working with PT for three years and was being seen once every two weeks -he is feeling a little more confident  -weight 307 lbs not getting enough protein -pt admits to eating poor diet (ex.:had four powdered donettes for breakfast)  Treatment Plan Problems: Anxiety, Unipolar Depression, Posttraumatic Stress Disorder (PTSD), Grief / Loss Unresolved,  Symptoms: Excessive and/or unrealistic worry that is difficult to control occurring more days than not for at least 6 months about a number of events or activities. Motor tension (e.g., restlessness, tiredness, shakiness, muscle tension). Hypervigilance (e.g., feeling constantly on edge, experiencing concentration difficulties, having trouble falling or staying asleep, exhibiting a  general state of irritability). Thoughts dominated by loss coupled with poor concentration, tearful spells, and confusion about the future. Serial losses in life (i.e., deaths, divorces, jobs) that led to depression and discouragement. Strong emotional response of sadness exhibited when losses are discussed. Lack of appetite, weight loss, and/or insomnia as well as other depression signs that occurred since the loss. Has been exposed to a traumatic event involving actual or perceived threat of death or serious injury. Reports response of intense fear, helplessness, or horror to the traumatic event. Experiences disturbing and persistent thoughts, images, and/or perceptions of the traumatic event. Displays significant psychological and/or physiological distress resulting from internal and external clues that are reminiscent of the traumatic event. Intentionally avoids thoughts, feelings, or discussions related to the traumatic event. Intentionally avoids activities, places, people, or objects (e.g., up-armored vehicles) that evoke memories of the event. Displays a significant decline in interest and engagement in activities. Experiences disturbances in sleep. Reports difficulty concentrating as well as feelings of guilt. Reports hypervigilance. Symptoms present more than one month. Impairment in social, occupational, or other areas of functioning. Depressed or irritable mood. Decrease or loss of appetite. Diminished interest in or enjoyment of activities. Sleeplessness or hypersomnia. Lack of energy. Poor concentration and indecisiveness. Social withdrawal. Feelings of hopelessness, worthlessness, or inappropriate guilt. Unresolved grief issues. History of chronic or recurrent depression for which the client has taken antidepressant medication, been hospitalized, had outpatient treatment, or had a course of electroconvulsive therapy. Goals: Learn and implement coping skills that result in a  reduction of anxiety and worry, and improved daily functioning. Enhance ability to effectively cope with the full variety of  life's worries and anxieties. Stabilize anxiety level while increasing ability to function on a daily basis. Begin a healthy grieving process around the loss. Develop an awareness of how the avoidance of grieving has affected life and begin the healing process. Resolve the loss, reengaging in old relationships and initiating new contacts with others. Eliminate or reduce the negative impact trauma related symptoms have on social, occupational, and family functioning. Thinks about or openly discusses the traumatic event with others without experiencing psychological or physiological distress. No longer avoids persons, places, activities, and objects that are reminiscent of the traumatic event. Alleviate depressive symptoms and return to previous level of effective functioning. Recognize, accept, and cope with feelings of depression. Develop healthy thinking patterns and beliefs about self, others, and the world that lead to the alleviation and help prevent the relapse of depression. Appropriately grieve the loss in order to normalize mood and to return to previously adaptive level of functioning. Objectives target date for all objectives is 11/19/2024 Describe situations, thoughts, feelings, and actions associated with anxieties and worries, their impact on functioning, and attempts to resolve them. Verbalize an understanding of the cognitive, physiological, and behavioral components of anxiety and its treatment. Learn and implement calming skills to reduce overall anxiety and manage anxiety symptoms. Learn and implement a strategy to limit the association between various environmental settings and worry, delaying the worry until a designated "worry time." Learn and implement problem-solving strategies for realistically addressing worries. Identify, challenge, and replace  biased, fearful self-talk with positive, realistic, and empowering self-talk. Verbalize an understanding of the role that cognitive biases play in excessive irrational worry and persistent anxiety symptoms. Identify and engage in pleasant activities on a daily basis. Learn and implement relapse prevention strategies for managing possible future anxiety symptoms. Learn to accept limitations in life and commit to tolerating, rather than avoiding, unpleasant emotions while accomplishing meaningful goals. Tell in detail the story of the current loss that is triggering symptoms. Identify what stages of grief have been experienced in the continuum of the grieving process. Begin verbalizing feelings associated with the loss. Verbalize resolution of feelings of guilt and regret associated with the loss. Identify and voice positives about the deceased loved one including previous positive experiences, positive characteristics, positive aspects of the relationship, and how these things may be remembered. Describe in as much detail as comfort allows the nature and history of the PTSD symptoms. Learn and implement calming skills. Participate in Cognitive Therapy to help identify, challenge, and replace biased, negative, and self-defeating thoughts resulting from the trauma. Learn and implement guided self-dialogue to manage thoughts, feelings, and urges brought on by encounters with trauma-related situations. Participate in Eye Movement Desensitization and Reprocessing (EMDR) to reduce emotional distress related to traumatic thoughts, feelings, and images. Acknowledge the need to implement anger control techniques; learn and implement anger management techniques. Implement a regular exercise regimen as a stress release technique. Describe current and past experiences with depression including their impact on functioning and attempts to resolve it. Verbalize an accurate understanding of depression. Identify  and replace thoughts and beliefs that support depression. Learn and implement behavioral strategies to overcome depression. Identify important people in life, past and present, and describe the quality, good and poor, of those relationships. Learn and implement problem-solving and decision-making skills. Learn and implement conflict resolution skills to resolve interpersonal problems. Interventions: Use empathy, compassion, and support, allowing the client to tell in detail the story of his/her recent loss. Assign the client to make a list  of all the regrets associated with actions toward or relationship with the deceased; process the list content toward resolution of these feelings. Assist the client in engaging in behaviors that celebrate the positive memorable aspects of the loved one and his/her life (e.g., placing memoriam in newspaper on anniversary of death, volunteering time to a favorite cause of the deceased person). Ask the client to discuss and/or list the positive aspects of and memories about his/her relationship with the lost loved one; reinforce the client's expression of positive memories and emotions (e.g., smiling, laughing); encourage the client to share these thoughts with supportive loved ones. Educate the client on the stages of the grieving process and answer any questions he/she may have. Assist the client in identifying the stages of grief that he/she has experienced and which stage he/she is presently working through. Ask the client to bring pictures or mementos connected with his/her loss to a session and talk about them (or assign "Creating a [place]" in the Adult Psychotherapy Homework Planner by Stephannie Li). Assist the client in identifying and expressing feelings connected with his/her loss. Gently and sensitively explore the client's recollection of the facts of the traumatic incident and his/her cognitive and emotional reactions at the time; assess frequency,  intensity, duration, and history of the client's PTSD symptoms and their impact on functioning (see "How the Trauma Affects Me" in the Adult Psychotherapy Homework Planner by Jongsma); supplement with semi-structured assessment instrument if desired (see The Anxiety Disorders Interview Schedule - Adult Version). Using Cognitive Therapy techniques, explore the client's self-talk and beliefs about self, others, and the future that are a consequence of the trauma (e.g., themes of safety, trust, power, control, esteem, and intimacy); identify and challenge biases; assist him/her in generating appraisals that correct for the biases; test biased and alternatives predictions through behavioral experiments. Teach the client a guided self-dialogue procedure in which he/she learns to recognize maladaptive self-talk, challenges its biases, copes with engendered feelings, overcomes avoidance, and reinforces his/her accomplishments; review and reinforce progress, problem-solve obstacles. Utilize Eye Movement Desensitization and Reprocessing (EMDR) to reduce the client's emotional reactivity to the traumatic event and reduce PTSD symptoms. Assess the client for instances of poor anger management that have led to threats or actual violence that caused damage to property and/or injury to people (or assign "Anger Journal" in the Adult Psychotherapy Homework Planner by Stephannie Li). Teach the client anger management techniques (see the Anger Control Problems chapter in this Planner). Develop and encourage a routine of physical exercise for the client. Teach the client calming skills (e.g., breathing retraining, relaxation, calming self-talk) to use in and between sessions when feeling overly distressed. Ask the client to describe his/her past experiences of anxiety and their impact on functioning; assess the focus, excessiveness, and uncontrollability of the worry and the type, frequency, intensity, and duration of his/her  anxiety symptoms (consider using a structured interview such as The Anxiety Disorders Interview Schedule-Adult Version). Explore the client's schema and self-talk that mediate his/her fear response; assist him/her in challenging the biases; replace the distorted messages with reality-based alternatives and positive, realistic self-talk that will increase his/her self-confidence in coping with irrational fears (see Cognitive Therapy of Anxiety Disorders by Laurence Slate). Teach the client problem-solving strategies involving specifically defining a problem, generating options for addressing it, evaluating the pros and cons of each option, selecting and implementing an optional action, and reevaluating and refining the action (or assign "Applying Problem-Solving to Interpersonal Conflict" in the Adult Psychotherapy Homework Planner by Stephannie Li).  Engage the client in behavioral activation, increasing the client's contact with sources of reward, identifying processes that inhibit activation, and teaching skills to solve life problems (or assign "Identify and Schedule Pleasant Activities" in the Adult Psychotherapy Homework Planner by Stephannie Li); use behavioral techniques such as instruction, rehearsal, role-playing, role reversal as needed to assist adoption into the client's daily life; reinforce success. Discuss with the client the distinction between a lapse and relapse, associating a lapse with an initial and reversible return of worry, anxiety symptoms, or urges to avoid, and relapse with the decision to continue the fearful and avoidant patterns. Identify and rehearse with the client the management of future situations or circumstances in which lapses could occur. Instruct the client to routinely use new therapeutic skills (e.g., relaxation, cognitive restructuring, exposure, and problem-solving) in daily life to address emergent worries, anxiety, and avoidant tendencies. Develop a "coping card" on which  coping strategies and other important information (e.g., "Breathe deeply and relax," "Challenge unrealistic worries," "Use problem-solving") are written for the client's later use. Use techniques from Acceptance and Commitment Therapy to help client accept uncomfortable realities such as lack of complete control, imperfections, and uncertainty and tolerate unpleasant emotions and thoughts in order to accomplish value-consistent goals. Discuss how generalized anxiety typically involves excessive worry about unrealistic threats, various bodily expressions of tension, overarousal, and hypervigilance, and avoidance of what is threatening that interact to maintain the problem (see Mastery of Your Anxiety and Worry: Therapist Guide by Renard Matter, and Barlow; Treating Generalized Anxiety Disorder by Rygh and Ida Rogue). Teach the client calming/relaxation skills (e.g., applied relaxation, progressive muscle relaxation, cue controlled relaxation; mindful breathing; biofeedback) and how to discriminate better between relaxation and tension; teach the client how to apply these skills to his/her daily life (e.g., New Directions in Progressive Muscle Relaxation by Marcelyn Ditty, and Hazlett-Stevens; Treating Generalized Anxiety Disorder by Rygh and Ida Rogue). Assign the client to read about progressive muscle relaxation and other calming strategies in relevant books or treatment manuals (e.g., Progressive Relaxation Training by Robb Matar and Alen Blew; Mastery of Your Anxiety and Worry: Workbook by Earlie Counts). Explain the rationale for using a worry time as well as how it is to be used; agree upon and implement a worry time with the client. Teach the client how to recognize, stop, and postpone worry to the agreed upon worry time using skills such as thought stopping, relaxation, and redirecting attention (or assign "Making Use of the Thought-Stopping Technique" and/or "Worry Time" in the Adult  Psychotherapy Homework Planner by Jongsma to assist skill development); encourage use in daily life; review and reinforce success while providing corrective feedback toward improvement. Assist the client in analyzing his/her worries by examining potential biases such as the probability of the negative expectation occurring, the real consequences of it occurring, his/her ability to control the outcome, the worst possible outcome, and his/her ability to accept it (see "Analyze the Probability of a Feared Event" in the Adult Psychotherapy Homework Planner by Stephannie Li; Cognitive Therapy of Anxiety Disorders by Laurence Slate). Encourage the client to share his/her thoughts and feelings of depression; express empathy and build rapport while identifying primary cognitive, behavioral, interpersonal, or other contributors to depression. Conduct Cognitive-Behavioral Therapy (see Cognitive Behavior Therapy by Reola Calkins; Overcoming Depression by Agapito Games al.), beginning with helping the client learn the connection among cognition, depressive feelings, and actions. Assign the client to self-monitor thoughts, feelings, and actions in daily journal (e.g., "Negative Thoughts Trigger Negative Feelings" in the Adult Psychotherapy Homework Planner  by Stephannie Li; "Daily Record of Dysfunctional Thoughts" in Cognitive Therapy of Depression by Ashby Dawes and Shea Evans); process the journal material to challenge depressive thinking patterns and replace them with reality-based thoughts. Facilitate and reinforce the client's shift from biased depressive self-talk and beliefs to reality-based cognitive messages that enhance self-confidence and increase adaptive actions (see "Positive Self-Talk" in the Adult Psychotherapy Homework Planner by Stephannie Li). Assist the client in developing skills that increase the likelihood of deriving pleasure from behavioral activation (e.g., assertiveness skills, developing an exercise plan, less internal/more  external focus, increased social involvement); reinforce success. Conduct Interpersonal Therapy (see Interpersonal Psychotherapy of Depression by Casimer Lanius al.), beginning with the assessment of the client's "interpersonal inventory" of important past and present relationships; develop a case formulation linking depression to grief, interpersonal role disputes, role transitions, and/or interpersonal deficits). Conduct Problem-Solving Therapy (see Problem-Solving Therapy by Domenick Bookbinder and Rob Hickman) using techniques such as psychoeducation, modeling, and role-playing to teach client problem-solving skills (i.e., defining a problem specifically, generating possible solutions, evaluating the pros and cons of each solution, selecting and implementing a plan of action, evaluating the efficacy of the plan, accepting or revising the plan); role-play application of the problem-solving skill to a real life issue (or assign "Applying Problem-Solving to Interpersonal Conflict" in the Adult Psychotherapy Homework Planner by Stephannie Li). Teach conflict resolution skills (e.g., empathy, active listening, "I messages," respectful communication, assertiveness without aggression, compromise); use psychoeducation, modeling, role-playing, and rehearsal to work through several current conflicts; assign homework exercises; review and repeat so as to integrate their use into the client's life. Consistent with the treatment model, discuss how cognitive, behavioral, interpersonal, and/or other factors (e.g., family history) contribute to depression.  Diagnosis:Generalized anxiety disorder  Current mild episode of major depressive disorder without prior episode [facility])  Uncomplicated bereavement  Plan:  -keep up exercise -talk with physician about referral for nutritionist -meet again on Wednesday, February 28, 2024 at 10am virtually.

## 2024-02-28 ENCOUNTER — Encounter: Payer: Self-pay | Admitting: Professional

## 2024-02-28 ENCOUNTER — Ambulatory Visit (INDEPENDENT_AMBULATORY_CARE_PROVIDER_SITE_OTHER): Payer: PRIVATE HEALTH INSURANCE | Admitting: Professional

## 2024-02-28 DIAGNOSIS — F320 Major depressive disorder, single episode, mild: Secondary | ICD-10-CM

## 2024-02-28 DIAGNOSIS — F411 Generalized anxiety disorder: Secondary | ICD-10-CM

## 2024-02-28 NOTE — Progress Notes (Signed)
 [facility] Counselor/Therapist Progress Note  Patient ID: Tom Underwood, MRN: [id number],    Date: 02/28/2024  Time Spent: 56 minutes 1001-1057am   Treatment Type: Individual Therapy  Risk Assessment: Danger to Self:  No Self-injurious Behavior: No Danger to Others: No Duty to Warn:no  Subjective: This session was held via video teletherapy. The patient consented to video teletherapy and was located at his home during this session. He is aware it is the responsibility of the patient to secure confidentiality on her end of the session. The provider was in a private home office for the duration of this session.    The patient arrived late for his Caregility appointment.   Issues addressed: 1-homework -keep up exercise -talk with physician about referral for nutritionist 2-anxiety -pt saw his anxiety increase with the pandemic -he has never returned to pre-pandemic functioning -he reports he was a "regular guy" with no anxiety issues -he retired until end of May 2020 and had stroke in July 2020 -he [place] insure if he has worked through his feelings related to stroke and how it changed his life "Now that's a good question" -are you angry "that's a good maybe, some things have gotten better and some things haven't   -he tries to be more positive about things   -negative "keeps you behind the eight ball" -"at times I feel like I was cheated" but acknowledges that he sees people worse off than himself -he compares into feeling "bittersweet" 3-managing anxious thoughts -educated -examples 4-reviewed objectives with patient -pt has made progress in all areas   Treatment Plan Problems: Anxiety, Unipolar Depression, Posttraumatic Stress Disorder (PTSD), Grief / Loss Unresolved,  Symptoms: Excessive and/or unrealistic worry that is difficult to control occurring more days than not for at least 6 months about a number of events or activities. Motor tension (e.g.,  restlessness, tiredness, shakiness, muscle tension). Hypervigilance (e.g., feeling constantly on edge, experiencing concentration difficulties, having trouble falling or staying asleep, exhibiting a general state of irritability). Thoughts dominated by loss coupled with poor concentration, tearful spells, and confusion about the future. Serial losses in life (i.e., deaths, divorces, jobs) that led to depression and discouragement. Strong emotional response of sadness exhibited when losses are discussed. Lack of appetite, weight loss, and/or insomnia as well as other depression signs that occurred since the loss. Has been exposed to a traumatic event involving actual or perceived threat of death or serious injury. Reports response of intense fear, helplessness, or horror to the traumatic event. Experiences disturbing and persistent thoughts, images, and/or perceptions of the traumatic event. Displays significant psychological and/or physiological distress resulting from internal and external clues that are reminiscent of the traumatic event. Intentionally avoids thoughts, feelings, or discussions related to the traumatic event. Intentionally avoids activities, places, people, or objects (e.g., up-armored vehicles) that evoke memories of the event. Displays a significant decline in interest and engagement in activities. Experiences disturbances in sleep. Reports difficulty concentrating as well as feelings of guilt. Reports hypervigilance. Symptoms present more than one month. Impairment in social, occupational, or other areas of functioning. Depressed or irritable mood. Decrease or loss of appetite. Diminished interest in or enjoyment of activities. Sleeplessness or hypersomnia. Lack of energy. Poor concentration and indecisiveness. Social withdrawal. Feelings of hopelessness, worthlessness, or inappropriate guilt. Unresolved grief issues. History of chronic or recurrent depression for  which the client has taken antidepressant medication, been hospitalized, had outpatient treatment, or had a course of electroconvulsive therapy. Goals: Learn and implement coping skills that  result in a reduction of anxiety and worry, and improved daily functioning. Enhance ability to effectively cope with the full variety of life's worries and anxieties. Stabilize anxiety level while increasing ability to function on a daily basis. Begin a healthy grieving process around the loss. Develop an awareness of how the avoidance of grieving has affected life and begin the healing process. Resolve the loss, reengaging in old relationships and initiating new contacts with others. Eliminate or reduce the negative impact trauma related symptoms have on social, occupational, and family functioning. Thinks about or openly discusses the traumatic event with others without experiencing psychological or physiological distress. No longer avoids persons, places, activities, and objects that are reminiscent of the traumatic event. Alleviate depressive symptoms and return to previous level of effective functioning. Recognize, accept, and cope with feelings of depression. Develop healthy thinking patterns and beliefs about self, others, and the world that lead to the alleviation and help prevent the relapse of depression. Appropriately grieve the loss in order to normalize mood and to return to previously adaptive level of functioning. Objectives target date for all objectives is 11/19/2024 Describe situations, thoughts, feelings, and actions associated with anxieties and worries, their impact on functioning, and attempts to resolve them.   50% Verbalize an understanding of the cognitive, physiological, and behavioral components of anxiety and its treatment.   40% Learn and implement calming skills to reduce overall anxiety and manage anxiety symptoms.  30% Learn and implement a strategy to limit the association  between various environmental settings and worry, delaying the worry until a designated "worry time."   20% Learn and implement problem-solving strategies for realistically addressing worries.   40% Identify, challenge, and replace biased, fearful self-talk with positive, realistic, and empowering self-talk.   65% Verbalize an understanding of the role that cognitive biases play in excessive irrational worry and persistent anxiety symptoms.   65% Identify and engage in pleasant activities on a daily basis.  65% Learn and implement relapse prevention strategies for managing possible future anxiety symptoms.   40% Learn to accept limitations in life and commit to tolerating, rather than avoiding, unpleasant emotions while accomplishing meaningful goals.  40% Tell in detail the story of the current loss that is triggering symptoms.   100% Identify what stages of grief have been experienced in the continuum of the grieving process.   40% (has not grieved mother's death) Begin verbalizing feelings associated with the loss.   20% Verbalize resolution of feelings of guilt and regret associated with the loss. 30% Identify and voice positives about the deceased loved one including previous positive experiences, positive characteristics, positive aspects of the relationship, and how these things may be remembered.   10% Learn and implement calming skills.   50% Learn and implement guided self-dialogue to manage thoughts, feelings, and urges brought on by encounters with trauma-related situations.   30% Acknowledge the need to implement anger control techniques; learn and implement anger management techniques.   30% Implement a regular exercise regimen as a stress release technique.   50% Describe current and past experiences with depression including their impact on functioning and attempts to resolve it.   70% Verbalize an accurate understanding of depression.   70% Identify and replace thoughts and beliefs  that support depression.   40% Learn and implement behavioral strategies to overcome depression.   60% Identify important people in life, past and present, and describe the quality, good and poor, of those relationships.   30% Learn and implement  problem-solving and decision-making skills.   30% Learn and implement conflict resolution skills to resolve interpersonal problems.   30% Interventions: Use empathy, compassion, and support, allowing the client to tell in detail the story of his/her recent loss. Assign the client to make a list of all the regrets associated with actions toward or relationship with the deceased; process the list content toward resolution of these feelings. Assist the client in engaging in behaviors that celebrate the positive memorable aspects of the loved one and his/her life (e.g., placing memoriam in newspaper on anniversary of death, volunteering time to a favorite cause of the deceased person). Ask the client to discuss and/or list the positive aspects of and memories about his/her relationship with the lost loved one; reinforce the client's expression of positive memories and emotions (e.g., smiling, laughing); encourage the client to share these thoughts with supportive loved ones. Educate the client on the stages of the grieving process and answer any questions he/she may have. Assist the client in identifying the stages of grief that he/she has experienced and which stage he/she is presently working through. Ask the client to bring pictures or mementos connected with his/her loss to a session and talk about them (or assign "Creating a [place]" in the Adult Psychotherapy Homework Planner by Stephannie Li). Assist the client in identifying and expressing feelings connected with his/her loss. Gently and sensitively explore the client's recollection of the facts of the traumatic incident and his/her cognitive and emotional reactions at the time; assess frequency,  intensity, duration, and history of the client's PTSD symptoms and their impact on functioning (see "How the Trauma Affects Me" in the Adult Psychotherapy Homework Planner by Jongsma); supplement with semi-structured assessment instrument if desired (see The Anxiety Disorders Interview Schedule - Adult Version). Using Cognitive Therapy techniques, explore the client's self-talk and beliefs about self, others, and the future that are a consequence of the trauma (e.g., themes of safety, trust, power, control, esteem, and intimacy); identify and challenge biases; assist him/her in generating appraisals that correct for the biases; test biased and alternatives predictions through behavioral experiments. Teach the client a guided self-dialogue procedure in which he/she learns to recognize maladaptive self-talk, challenges its biases, copes with engendered feelings, overcomes avoidance, and reinforces his/her accomplishments; review and reinforce progress, problem-solve obstacles. Utilize Eye Movement Desensitization and Reprocessing (EMDR) to reduce the client's emotional reactivity to the traumatic event and reduce PTSD symptoms. Assess the client for instances of poor anger management that have led to threats or actual violence that caused damage to property and/or injury to people (or assign "Anger Journal" in the Adult Psychotherapy Homework Planner by Stephannie Li). Teach the client anger management techniques (see the Anger Control Problems chapter in this Planner). Develop and encourage a routine of physical exercise for the client. Teach the client calming skills (e.g., breathing retraining, relaxation, calming self-talk) to use in and between sessions when feeling overly distressed. Ask the client to describe his/her past experiences of anxiety and their impact on functioning; assess the focus, excessiveness, and uncontrollability of the worry and the type, frequency, intensity, and duration of his/her  anxiety symptoms (consider using a structured interview such as The Anxiety Disorders Interview Schedule-Adult Version). Explore the client's schema and self-talk that mediate his/her fear response; assist him/her in challenging the biases; replace the distorted messages with reality-based alternatives and positive, realistic self-talk that will increase his/her self-confidence in coping with irrational fears (see Cognitive Therapy of Anxiety Disorders by Laurence Slate). Teach the client problem-solving  strategies involving specifically defining a problem, generating options for addressing it, evaluating the pros and cons of each option, selecting and implementing an optional action, and reevaluating and refining the action (or assign "Applying Problem-Solving to Interpersonal Conflict" in the Adult Psychotherapy Homework Planner by Stephannie Li). Engage the client in behavioral activation, increasing the client's contact with sources of reward, identifying processes that inhibit activation, and teaching skills to solve life problems (or assign "Identify and Schedule Pleasant Activities" in the Adult Psychotherapy Homework Planner by Stephannie Li); use behavioral techniques such as instruction, rehearsal, role-playing, role reversal as needed to assist adoption into the client's daily life; reinforce success. Discuss with the client the distinction between a lapse and relapse, associating a lapse with an initial and reversible return of worry, anxiety symptoms, or urges to avoid, and relapse with the decision to continue the fearful and avoidant patterns. Identify and rehearse with the client the management of future situations or circumstances in which lapses could occur. Instruct the client to routinely use new therapeutic skills (e.g., relaxation, cognitive restructuring, exposure, and problem-solving) in daily life to address emergent worries, anxiety, and avoidant tendencies. Develop a "coping card" on which  coping strategies and other important information (e.g., "Breathe deeply and relax," "Challenge unrealistic worries," "Use problem-solving") are written for the client's later use. Use techniques from Acceptance and Commitment Therapy to help client accept uncomfortable realities such as lack of complete control, imperfections, and uncertainty and tolerate unpleasant emotions and thoughts in order to accomplish value-consistent goals. Discuss how generalized anxiety typically involves excessive worry about unrealistic threats, various bodily expressions of tension, overarousal, and hypervigilance, and avoidance of what is threatening that interact to maintain the problem (see Mastery of Your Anxiety and Worry: Therapist Guide by Renard Matter, and Barlow; Treating Generalized Anxiety Disorder by Rygh and Ida Rogue). Teach the client calming/relaxation skills (e.g., applied relaxation, progressive muscle relaxation, cue controlled relaxation; mindful breathing; biofeedback) and how to discriminate better between relaxation and tension; teach the client how to apply these skills to his/her daily life (e.g., New Directions in Progressive Muscle Relaxation by Marcelyn Ditty, and Hazlett-Stevens; Treating Generalized Anxiety Disorder by Rygh and Ida Rogue). Assign the client to read about progressive muscle relaxation and other calming strategies in relevant books or treatment manuals (e.g., Progressive Relaxation Training by Robb Matar and Alen Blew; Mastery of Your Anxiety and Worry: Workbook by Earlie Counts). Explain the rationale for using a worry time as well as how it is to be used; agree upon and implement a worry time with the client. Teach the client how to recognize, stop, and postpone worry to the agreed upon worry time using skills such as thought stopping, relaxation, and redirecting attention (or assign "Making Use of the Thought-Stopping Technique" and/or "Worry Time" in the Adult  Psychotherapy Homework Planner by Jongsma to assist skill development); encourage use in daily life; review and reinforce success while providing corrective feedback toward improvement. Assist the client in analyzing his/her worries by examining potential biases such as the probability of the negative expectation occurring, the real consequences of it occurring, his/her ability to control the outcome, the worst possible outcome, and his/her ability to accept it (see "Analyze the Probability of a Feared Event" in the Adult Psychotherapy Homework Planner by Stephannie Li; Cognitive Therapy of Anxiety Disorders by Laurence Slate). Encourage the client to share his/her thoughts and feelings of depression; express empathy and build rapport while identifying primary cognitive, behavioral, interpersonal, or other contributors to depression. Conduct Cognitive-Behavioral Therapy (see Cognitive Behavior Therapy  by Reola Calkins; Overcoming Depression by Agapito Games al.), beginning with helping the client learn the connection among cognition, depressive feelings, and actions. Assign the client to self-monitor thoughts, feelings, and actions in daily journal (e.g., "Negative Thoughts Trigger Negative Feelings" in the Adult Psychotherapy Homework Planner by Stephannie Li; "Daily Record of Dysfunctional Thoughts" in Cognitive Therapy of Depression by Ashby Dawes and Shea Evans); process the journal material to challenge depressive thinking patterns and replace them with reality-based thoughts. Facilitate and reinforce the client's shift from biased depressive self-talk and beliefs to reality-based cognitive messages that enhance self-confidence and increase adaptive actions (see "Positive Self-Talk" in the Adult Psychotherapy Homework Planner by Stephannie Li). Assist the client in developing skills that increase the likelihood of deriving pleasure from behavioral activation (e.g., assertiveness skills, developing an exercise plan, less internal/more  external focus, increased social involvement); reinforce success. Conduct Interpersonal Therapy (see Interpersonal Psychotherapy of Depression by Casimer Lanius al.), beginning with the assessment of the client's "interpersonal inventory" of important past and present relationships; develop a case formulation linking depression to grief, interpersonal role disputes, role transitions, and/or interpersonal deficits). Conduct Problem-Solving Therapy (see Problem-Solving Therapy by Domenick Bookbinder and Rob Hickman) using techniques such as psychoeducation, modeling, and role-playing to teach client problem-solving skills (i.e., defining a problem specifically, generating possible solutions, evaluating the pros and cons of each solution, selecting and implementing a plan of action, evaluating the efficacy of the plan, accepting or revising the plan); role-play application of the problem-solving skill to a real life issue (or assign "Applying Problem-Solving to Interpersonal Conflict" in the Adult Psychotherapy Homework Planner by Stephannie Li). Teach conflict resolution skills (e.g., empathy, active listening, "I messages," respectful communication, assertiveness without aggression, compromise); use psychoeducation, modeling, role-playing, and rehearsal to work through several current conflicts; assign homework exercises; review and repeat so as to integrate their use into the client's life. Consistent with the treatment model, discuss how cognitive, behavioral, interpersonal, and/or other factors (e.g., family history) contribute to depression.  Diagnosis:Generalized anxiety disorder  Current mild episode of major depressive disorder without prior episode [facility])  Plan:  -what are the feelings he has about how life has changed since his stroke -think about your mom and help Clinician get to know her -meet again on Wednesday, March 13, 2024 at 10am virtually.

## 2024-03-04 ENCOUNTER — Other Ambulatory Visit: Payer: Self-pay | Admitting: Family Medicine

## 2024-03-04 DIAGNOSIS — F419 Anxiety disorder, unspecified: Secondary | ICD-10-CM

## 2024-03-13 ENCOUNTER — Ambulatory Visit: Payer: PRIVATE HEALTH INSURANCE | Admitting: Professional

## 2024-03-13 ENCOUNTER — Encounter: Payer: Self-pay | Admitting: Professional

## 2024-03-13 DIAGNOSIS — Z634 Disappearance and death of family member: Secondary | ICD-10-CM

## 2024-03-13 DIAGNOSIS — F411 Generalized anxiety disorder: Secondary | ICD-10-CM | POA: Diagnosis not present

## 2024-03-13 DIAGNOSIS — F320 Major depressive disorder, single episode, mild: Secondary | ICD-10-CM

## 2024-03-13 NOTE — Progress Notes (Signed)
 [facility] Counselor/Therapist Progress Note  Patient ID: Tom Underwood, MRN: [id number],    Date: 03/13/2024  Time Spent: 50 minutes 1003-1053am   Treatment Type: Individual Therapy  Risk Assessment: Danger to Self:  No Self-injurious Behavior: No Danger to Others: No Duty to Warn:no  Subjective: This session was held via video teletherapy. The patient consented to video teletherapy and was located at his home during this session. He is aware it is the responsibility of the patient to secure confidentiality on her end of the session. The provider was in a private home office for the duration of this session.    The patient arrived late for his Caregility appointment.   Issues addressed: 1-homework a-what are the feelings he has about how life has changed since his stroke -he believes "this happened for a reason" -he is still talking about the events related to his stroke -he knows that God has been taking care of him and he doesn't have to worry -felt "bad" that other people in the unit that had strokes were worse off than him -he felt grateful that he was more functional -he thoughts continues of being grateful b-think about your mom and help Clinician get to know her -"I could be here all day telling about the missing things" -she died in March 18, 2022of natural causes (Alzheimer's and breathing problems) at the age of 68 -his oldest brother oversaw his mother's care -his brother Gerlene Burdock texted to tell him that mom died; "truthfully, it sucked, I sorta lost dad the same way" -Richard called him at 3am to tell him that dad had a stroke but was told not to rush and it makes him upset, he died in 2007/03/23 at 30 -pt got his affairs in order and left in the early afternoon -on his way to [facility], the doctor called and told him he had passed -he has no contact with Gerlene Burdock -he does have contact with his younger brother Trey Paula -related to inheritance, his brother Gerlene Burdock  built a house on their land and when they passed the pt or his brother Trey Paula did not get anything -he treasures a daily red rider bb gun from his father -he had a good relationship with his dad -his dad was an Environmental consultant when the boys played baseball and was there the entire time -his parents met as pen pals when his father went in the army in early [birth year]22[birth year]1949 -he was stationed in [place], [place] where he was an MP -pt is named after his partner who went through basic training and both worked together as MP's -his first leave he came home and married his mom and they returned together to [place] and they have hundreds of pictures of them doing thing -his dad and MP friend were in the [name] movie from [employer] as two extras in a beach scene where two MP's try and arrest Sinatra -pt was pleased that he was able to carry on a conversation and not continue crying 2-exercise -took the weekend off to go to grandson Jackson's 12th birthday party in [place] -his wife got him an elliptical and he has tried out but hasn't taken it for the ten minute spin yet  Treatment Plan Problems: Anxiety, Unipolar Depression, Posttraumatic Stress Disorder (PTSD), Grief / Loss Unresolved,  Symptoms: Excessive and/or unrealistic worry that is difficult to control occurring more days than not for at least 6 months about a number of events or activities. Motor tension (e.g., restlessness, tiredness, shakiness, muscle  tension). Hypervigilance (e.g., feeling constantly on edge, experiencing concentration difficulties, having trouble falling or staying asleep, exhibiting a general state of irritability). Thoughts dominated by loss coupled with poor concentration, tearful spells, and confusion about the future. Serial losses in life (i.e., deaths, divorces, jobs) that led to depression and discouragement. Strong emotional response of sadness exhibited when losses are discussed. Lack of appetite, weight loss, and/or insomnia  as well as other depression signs that occurred since the loss. Has been exposed to a traumatic event involving actual or perceived threat of death or serious injury. Reports response of intense fear, helplessness, or horror to the traumatic event. Experiences disturbing and persistent thoughts, images, and/or perceptions of the traumatic event. Displays significant psychological and/or physiological distress resulting from internal and external clues that are reminiscent of the traumatic event. Intentionally avoids thoughts, feelings, or discussions related to the traumatic event. Intentionally avoids activities, places, people, or objects (e.g., up-armored vehicles) that evoke memories of the event. Displays a significant decline in interest and engagement in activities. Experiences disturbances in sleep. Reports difficulty concentrating as well as feelings of guilt. Reports hypervigilance. Symptoms present more than one month. Impairment in social, occupational, or other areas of functioning. Depressed or irritable mood. Decrease or loss of appetite. Diminished interest in or enjoyment of activities. Sleeplessness or hypersomnia. Lack of energy. Poor concentration and indecisiveness. Social withdrawal. Feelings of hopelessness, worthlessness, or inappropriate guilt. Unresolved grief issues. History of chronic or recurrent depression for which the client has taken antidepressant medication, been hospitalized, had outpatient treatment, or had a course of electroconvulsive therapy. Goals: Learn and implement coping skills that result in a reduction of anxiety and worry, and improved daily functioning. Enhance ability to effectively cope with the full variety of life's worries and anxieties. Stabilize anxiety level while increasing ability to function on a daily basis. Begin a healthy grieving process around the loss. Develop an awareness of how the avoidance of grieving has affected life  and begin the healing process. Resolve the loss, reengaging in old relationships and initiating new contacts with others. Eliminate or reduce the negative impact trauma related symptoms have on social, occupational, and family functioning. Thinks about or openly discusses the traumatic event with others without experiencing psychological or physiological distress. No longer avoids persons, places, activities, and objects that are reminiscent of the traumatic event. Alleviate depressive symptoms and return to previous level of effective functioning. Recognize, accept, and cope with feelings of depression. Develop healthy thinking patterns and beliefs about self, others, and the world that lead to the alleviation and help prevent the relapse of depression. Appropriately grieve the loss in order to normalize mood and to return to previously adaptive level of functioning. Objectives target date for all objectives is 11/19/2024 Describe situations, thoughts, feelings, and actions associated with anxieties and worries, their impact on functioning, and attempts to resolve them.   50% Verbalize an understanding of the cognitive, physiological, and behavioral components of anxiety and its treatment.   40% Learn and implement calming skills to reduce overall anxiety and manage anxiety symptoms.  30% Learn and implement a strategy to limit the association between various environmental settings and worry, delaying the worry until a designated "worry time."   20% Learn and implement problem-solving strategies for realistically addressing worries.   40% Identify, challenge, and replace biased, fearful self-talk with positive, realistic, and empowering self-talk.   65% Verbalize an understanding of the role that cognitive biases play in excessive irrational worry and persistent anxiety symptoms.  65% Identify and engage in pleasant activities on a daily basis.  65% Learn and implement relapse prevention  strategies for managing possible future anxiety symptoms.   40% Learn to accept limitations in life and commit to tolerating, rather than avoiding, unpleasant emotions while accomplishing meaningful goals.  40% Tell in detail the story of the current loss that is triggering symptoms.   100% Identify what stages of grief have been experienced in the continuum of the grieving process.   40% (has not grieved mother's death) Begin verbalizing feelings associated with the loss.   20% Verbalize resolution of feelings of guilt and regret associated with the loss. 30% Identify and voice positives about the deceased loved one including previous positive experiences, positive characteristics, positive aspects of the relationship, and how these things may be remembered.   10% Learn and implement calming skills.   50% Learn and implement guided self-dialogue to manage thoughts, feelings, and urges brought on by encounters with trauma-related situations.   30% Acknowledge the need to implement anger control techniques; learn and implement anger management techniques.   30% Implement a regular exercise regimen as a stress release technique.   50% Describe current and past experiences with depression including their impact on functioning and attempts to resolve it.   70% Verbalize an accurate understanding of depression.   70% Identify and replace thoughts and beliefs that support depression.   40% Learn and implement behavioral strategies to overcome depression.   60% Identify important people in life, past and present, and describe the quality, good and poor, of those relationships.   30% Learn and implement problem-solving and decision-making skills.   30% Learn and implement conflict resolution skills to resolve interpersonal problems.   30% Interventions: Use empathy, compassion, and support, allowing the client to tell in detail the story of his/her recent loss. Assign the client to make a list of all the  regrets associated with actions toward or relationship with the deceased; process the list content toward resolution of these feelings. Assist the client in engaging in behaviors that celebrate the positive memorable aspects of the loved one and his/her life (e.g., placing memoriam in newspaper on anniversary of death, volunteering time to a favorite cause of the deceased person). Ask the client to discuss and/or list the positive aspects of and memories about his/her relationship with the lost loved one; reinforce the client's expression of positive memories and emotions (e.g., smiling, laughing); encourage the client to share these thoughts with supportive loved ones. Educate the client on the stages of the grieving process and answer any questions he/she may have. Assist the client in identifying the stages of grief that he/she has experienced and which stage he/she is presently working through. Ask the client to bring pictures or mementos connected with his/her loss to a session and talk about them (or assign "Creating a [place]" in the Adult Psychotherapy Homework Planner by Stephannie Li). Assist the client in identifying and expressing feelings connected with his/her loss. Gently and sensitively explore the client's recollection of the facts of the traumatic incident and his/her cognitive and emotional reactions at the time; assess frequency, intensity, duration, and history of the client's PTSD symptoms and their impact on functioning (see "How the Trauma Affects Me" in the Adult Psychotherapy Homework Planner by Jongsma); supplement with semi-structured assessment instrument if desired (see The Anxiety Disorders Interview Schedule - Adult Version). Using Cognitive Therapy techniques, explore the client's self-talk and beliefs about self, others, and the future that are a  consequence of the trauma (e.g., themes of safety, trust, power, control, esteem, and intimacy); identify and challenge biases;  assist him/her in generating appraisals that correct for the biases; test biased and alternatives predictions through behavioral experiments. Teach the client a guided self-dialogue procedure in which he/she learns to recognize maladaptive self-talk, challenges its biases, copes with engendered feelings, overcomes avoidance, and reinforces his/her accomplishments; review and reinforce progress, problem-solve obstacles. Utilize Eye Movement Desensitization and Reprocessing (EMDR) to reduce the client's emotional reactivity to the traumatic event and reduce PTSD symptoms. Assess the client for instances of poor anger management that have led to threats or actual violence that caused damage to property and/or injury to people (or assign "Anger Journal" in the Adult Psychotherapy Homework Planner by Stephannie Li). Teach the client anger management techniques (see the Anger Control Problems chapter in this Planner). Develop and encourage a routine of physical exercise for the client. Teach the client calming skills (e.g., breathing retraining, relaxation, calming self-talk) to use in and between sessions when feeling overly distressed. Ask the client to describe his/her past experiences of anxiety and their impact on functioning; assess the focus, excessiveness, and uncontrollability of the worry and the type, frequency, intensity, and duration of his/her anxiety symptoms (consider using a structured interview such as The Anxiety Disorders Interview Schedule-Adult Version). Explore the client's schema and self-talk that mediate his/her fear response; assist him/her in challenging the biases; replace the distorted messages with reality-based alternatives and positive, realistic self-talk that will increase his/her self-confidence in coping with irrational fears (see Cognitive Therapy of Anxiety Disorders by Laurence Slate). Teach the client problem-solving strategies involving specifically defining a problem,  generating options for addressing it, evaluating the pros and cons of each option, selecting and implementing an optional action, and reevaluating and refining the action (or assign "Applying Problem-Solving to Interpersonal Conflict" in the Adult Psychotherapy Homework Planner by Stephannie Li). Engage the client in behavioral activation, increasing the client's contact with sources of reward, identifying processes that inhibit activation, and teaching skills to solve life problems (or assign "Identify and Schedule Pleasant Activities" in the Adult Psychotherapy Homework Planner by Stephannie Li); use behavioral techniques such as instruction, rehearsal, role-playing, role reversal as needed to assist adoption into the client's daily life; reinforce success. Discuss with the client the distinction between a lapse and relapse, associating a lapse with an initial and reversible return of worry, anxiety symptoms, or urges to avoid, and relapse with the decision to continue the fearful and avoidant patterns. Identify and rehearse with the client the management of future situations or circumstances in which lapses could occur. Instruct the client to routinely use new therapeutic skills (e.g., relaxation, cognitive restructuring, exposure, and problem-solving) in daily life to address emergent worries, anxiety, and avoidant tendencies. Develop a "coping card" on which coping strategies and other important information (e.g., "Breathe deeply and relax," "Challenge unrealistic worries," "Use problem-solving") are written for the client's later use. Use techniques from Acceptance and Commitment Therapy to help client accept uncomfortable realities such as lack of complete control, imperfections, and uncertainty and tolerate unpleasant emotions and thoughts in order to accomplish value-consistent goals. Discuss how generalized anxiety typically involves excessive worry about unrealistic threats, various bodily expressions of  tension, overarousal, and hypervigilance, and avoidance of what is threatening that interact to maintain the problem (see Mastery of Your Anxiety and Worry: Therapist Guide by Renard Matter, and Barlow; Treating Generalized Anxiety Disorder by Rygh and Ida Rogue). Teach the client calming/relaxation skills (e.g., applied relaxation, progressive muscle relaxation,  cue controlled relaxation; mindful breathing; biofeedback) and how to discriminate better between relaxation and tension; teach the client how to apply these skills to his/her daily life (e.g., New Directions in Progressive Muscle Relaxation by Marcelyn Ditty, and Hazlett-Stevens; Treating Generalized Anxiety Disorder by Rygh and Ida Rogue). Assign the client to read about progressive muscle relaxation and other calming strategies in relevant books or treatment manuals (e.g., Progressive Relaxation Training by Robb Matar and Alen Blew; Mastery of Your Anxiety and Worry: Workbook by Earlie Counts). Explain the rationale for using a worry time as well as how it is to be used; agree upon and implement a worry time with the client. Teach the client how to recognize, stop, and postpone worry to the agreed upon worry time using skills such as thought stopping, relaxation, and redirecting attention (or assign "Making Use of the Thought-Stopping Technique" and/or "Worry Time" in the Adult Psychotherapy Homework Planner by Jongsma to assist skill development); encourage use in daily life; review and reinforce success while providing corrective feedback toward improvement. Assist the client in analyzing his/her worries by examining potential biases such as the probability of the negative expectation occurring, the real consequences of it occurring, his/her ability to control the outcome, the worst possible outcome, and his/her ability to accept it (see "Analyze the Probability of a Feared Event" in the Adult Psychotherapy Homework Planner by Stephannie Li;  Cognitive Therapy of Anxiety Disorders by Laurence Slate). Encourage the client to share his/her thoughts and feelings of depression; express empathy and build rapport while identifying primary cognitive, behavioral, interpersonal, or other contributors to depression. Conduct Cognitive-Behavioral Therapy (see Cognitive Behavior Therapy by Reola Calkins; Overcoming Depression by Agapito Games al.), beginning with helping the client learn the connection among cognition, depressive feelings, and actions. Assign the client to self-monitor thoughts, feelings, and actions in daily journal (e.g., "Negative Thoughts Trigger Negative Feelings" in the Adult Psychotherapy Homework Planner by Stephannie Li; "Daily Record of Dysfunctional Thoughts" in Cognitive Therapy of Depression by Ashby Dawes and Shea Evans); process the journal material to challenge depressive thinking patterns and replace them with reality-based thoughts. Facilitate and reinforce the client's shift from biased depressive self-talk and beliefs to reality-based cognitive messages that enhance self-confidence and increase adaptive actions (see "Positive Self-Talk" in the Adult Psychotherapy Homework Planner by Stephannie Li). Assist the client in developing skills that increase the likelihood of deriving pleasure from behavioral activation (e.g., assertiveness skills, developing an exercise plan, less internal/more external focus, increased social involvement); reinforce success. Conduct Interpersonal Therapy (see Interpersonal Psychotherapy of Depression by Casimer Lanius al.), beginning with the assessment of the client's "interpersonal inventory" of important past and present relationships; develop a case formulation linking depression to grief, interpersonal role disputes, role transitions, and/or interpersonal deficits). Conduct Problem-Solving Therapy (see Problem-Solving Therapy by Domenick Bookbinder and Rob Hickman) using techniques such as psychoeducation, modeling, and role-playing  to teach client problem-solving skills (i.e., defining a problem specifically, generating possible solutions, evaluating the pros and cons of each solution, selecting and implementing a plan of action, evaluating the efficacy of the plan, accepting or revising the plan); role-play application of the problem-solving skill to a real life issue (or assign "Applying Problem-Solving to Interpersonal Conflict" in the Adult Psychotherapy Homework Planner by Stephannie Li). Teach conflict resolution skills (e.g., empathy, active listening, "I messages," respectful communication, assertiveness without aggression, compromise); use psychoeducation, modeling, role-playing, and rehearsal to work through several current conflicts; assign homework exercises; review and repeat so as to integrate their use into the client's life. Consistent with the treatment model, discuss  how cognitive, behavioral, interpersonal, and/or other factors (e.g., family history) contribute to depression.  Diagnosis:Generalized anxiety disorder  Current mild episode of major depressive disorder without prior episode [facility])  Uncomplicated bereavement  Plan:  -meet again on Wednesday, March 27, 2024 at 10am virtually.

## 2024-03-20 ENCOUNTER — Other Ambulatory Visit: Payer: Self-pay | Admitting: Family Medicine

## 2024-03-20 DIAGNOSIS — F419 Anxiety disorder, unspecified: Secondary | ICD-10-CM

## 2024-03-25 DIAGNOSIS — Z85820 Personal history of malignant melanoma of skin: Secondary | ICD-10-CM | POA: Diagnosis not present

## 2024-03-25 DIAGNOSIS — D2261 Melanocytic nevi of right upper limb, including shoulder: Secondary | ICD-10-CM | POA: Diagnosis not present

## 2024-03-25 DIAGNOSIS — Z85828 Personal history of other malignant neoplasm of skin: Secondary | ICD-10-CM | POA: Diagnosis not present

## 2024-03-25 DIAGNOSIS — D225 Melanocytic nevi of trunk: Secondary | ICD-10-CM | POA: Diagnosis not present

## 2024-03-25 DIAGNOSIS — D2262 Melanocytic nevi of left upper limb, including shoulder: Secondary | ICD-10-CM | POA: Diagnosis not present

## 2024-03-25 DIAGNOSIS — Z86006 Personal history of melanoma in-situ: Secondary | ICD-10-CM | POA: Diagnosis not present

## 2024-03-25 DIAGNOSIS — D2272 Melanocytic nevi of left lower limb, including hip: Secondary | ICD-10-CM | POA: Diagnosis not present

## 2024-03-27 ENCOUNTER — Ambulatory Visit (INDEPENDENT_AMBULATORY_CARE_PROVIDER_SITE_OTHER): Payer: PRIVATE HEALTH INSURANCE | Admitting: Professional

## 2024-03-27 ENCOUNTER — Encounter: Payer: Self-pay | Admitting: Professional

## 2024-03-27 DIAGNOSIS — F411 Generalized anxiety disorder: Secondary | ICD-10-CM | POA: Diagnosis not present

## 2024-03-27 DIAGNOSIS — F320 Major depressive disorder, single episode, mild: Secondary | ICD-10-CM | POA: Diagnosis not present

## 2024-03-27 DIAGNOSIS — Z634 Disappearance and death of family member: Secondary | ICD-10-CM | POA: Diagnosis not present

## 2024-03-27 NOTE — Progress Notes (Signed)
 [facility] Counselor/Therapist Progress Note  Patient ID: Tom Underwood, MRN: [id number],    Date: 03/27/2024  Time Spent: 49 minutes 1004-1053am   Treatment Type: Individual Therapy  Risk Assessment: Danger to Self:  No Self-injurious Behavior: No Danger to Others: No Duty to Warn:no  Subjective: This session was held via video teletherapy. The patient consented to video teletherapy and was located at his home during this session. He is aware it is the responsibility of the patient to secure confidentiality on her end of the session. The provider was in a private home office for the duration of this session.    The patient arrived late for his Caregility appointment.   Issues addressed: 1-leaving for beach on May 2nd and will return on May 11th -has been staying at [name] for quite a few years -got married there nine years ago, soon will be celebrating ten years -they have been together for 25 years 2-has been thinking about mom over the past week or so -almost always has sweet thoughts -"I guess I just miss her" -he smiles sweetly when he talks about his mother -he thinks of her as "my first gf, my first valentine" -on Easter Sunday he looked up and said "Happy Tom Underwood", on easter is the only time he ever called her by her first name -he missed her voice the most -he likes to talk about his mom with his HS friends because they all knew her -she passed at 34 in 24-May-2022 -he wishes his brother Tom Underwood 35 would have called him and spoke to him and not texted   -pt admits that he has some hurt feelings by how he was notified -hr brother sold the family home and recently married and moved to [name] -he knows that he would not have seen her alive -daughter Tom Underwood took she and Tom Underwood to [name]  for her mother's funeral -younger brother Tom Underwood 3 continues to live in [name]  with his wife of 46 years 3-daughter Tom Underwood and grandchildren 8 year old Tom Underwood and 71 year  old Tom Underwood -he is loving having his daughter and grandchildren there -wife had one daughter Tom Underwood who died in 2022-05-24  -granddaughter had a baby girl during the pandemic and has had no contact since her mother's death 4-reminisced about his father as well -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 Motor tension (e.g., restlessness, tiredness, shakiness, muscle tension). Hypervigilance (e.g., feeling constantly on edge, experiencing concentration difficulties, having trouble falling or staying asleep, exhibiting a general state of irritability). Thoughts dominated by loss coupled with poor concentration, tearful spells, and confusion about the future. Serial losses in life (i.e., deaths, divorces, jobs) that led to depression and discouragement. Strong emotional response of sadness exhibited when losses are discussed. Lack of appetite, weight loss, and/or insomnia as well as other depression signs that occurred since the loss. Has been exposed to a traumatic event involving actual or perceived threat of death or serious injury. Reports response of intense fear, helplessness, or horror to the traumatic event. Experiences disturbing and persistent thoughts, images, and/or perceptions of the traumatic event.  Displays significant psychological and/or physiological distress resulting from internal and external clues that are reminiscent of the traumatic event. Intentionally avoids thoughts, feelings, or discussions related to the traumatic event. Intentionally avoids activities,  places, people, or objects (e.g., up-armored vehicles) that evoke memories of the event. Displays a significant decline in interest and engagement in activities. Experiences disturbances in sleep. Reports difficulty concentrating as well as feelings of guilt. Reports hypervigilance. Symptoms present more than one month. Impairment in social, occupational, or other areas of functioning. Depressed or irritable mood. Decrease or loss of appetite. Diminished interest in or enjoyment of activities. Sleeplessness or hypersomnia. Lack of energy. Poor concentration and indecisiveness. Social withdrawal. Feelings of hopelessness, worthlessness, or inappropriate guilt. Unresolved grief issues. History of chronic or recurrent depression for which the client has taken antidepressant medication, been hospitalized, had outpatient treatment, or had a course of electroconvulsive therapy. Goals: Learn and implement coping skills that result in a reduction of anxiety and worry, and improved daily functioning. Enhance ability to effectively cope with the full variety of life's worries and anxieties. Stabilize anxiety level while increasing ability to function on a daily basis. Begin a healthy grieving process around the loss. Develop an awareness of how the avoidance of grieving has affected life and begin the healing process. Resolve the loss, reengaging in old relationships and initiating new contacts with others. Eliminate or reduce the negative impact trauma related symptoms have on social, occupational, and family functioning. Thinks about or openly discusses the traumatic event with others without experiencing psychological or physiological distress. No longer avoids persons, places, activities, and objects that are reminiscent of the traumatic event. Alleviate depressive symptoms and return to previous level of effective functioning. Recognize, accept, and cope with feelings of depression. Develop  healthy thinking patterns and beliefs about self, others, and the world that lead to the alleviation and help prevent the relapse of depression. Appropriately grieve the loss in order to normalize mood and to return to previously adaptive level of functioning. Objectives target date for all objectives is 11/19/2024 Describe situations, thoughts, feelings, and actions associated with anxieties and worries, their impact on functioning, and attempts to resolve them.   50% Verbalize an understanding of the cognitive, physiological, and behavioral components of anxiety and its treatment.   40% Learn and implement calming skills to reduce overall anxiety and manage anxiety symptoms.  30% Learn and implement a strategy to limit the association between various environmental settings and worry, delaying the worry until a designated "worry time."   20% Learn and implement problem-solving strategies for realistically addressing worries.   40% Identify, challenge, and replace biased, fearful self-talk with positive, realistic, and empowering self-talk.   65% Verbalize an understanding of the role that cognitive biases play in excessive irrational worry and persistent anxiety symptoms.   65% Identify and engage in pleasant activities on a daily basis.  65% Learn and implement relapse prevention strategies for managing possible future anxiety symptoms.   40% Learn to accept limitations in life and commit to tolerating, rather than avoiding, unpleasant emotions while accomplishing meaningful goals.  40% Tell in detail the story of the current loss that is triggering symptoms.   100% Identify what stages of grief have been experienced in the continuum of the grieving process.   40% (has not grieved mother's death) Begin verbalizing feelings associated with the loss.   20% Verbalize resolution of feelings of guilt and regret associated with the loss. 30% Identify and voice positives about the deceased loved  one  including previous positive experiences, positive characteristics, positive aspects of the relationship, and how these things may be remembered.   10% Learn and implement calming skills.   50% Learn and implement guided self-dialogue to manage thoughts, feelings, and urges brought on by encounters with trauma-related situations.   30% Acknowledge the need to implement anger control techniques; learn and implement anger management techniques.   30% Implement a regular exercise regimen as a stress release technique.   50% Describe current and past experiences with depression including their impact on functioning and attempts to resolve it.   70% Verbalize an accurate understanding of depression.   70% Identify and replace thoughts and beliefs that support depression.   40% Learn and implement behavioral strategies to overcome depression.   60% Identify important people in life, past and present, and describe the quality, good and poor, of those relationships.   30% Learn and implement problem-solving and decision-making skills.   30% Learn and implement conflict resolution skills to resolve interpersonal problems.   30% Interventions: Use empathy, compassion, and support, allowing the client to tell in detail the story of his/her recent loss. Assign the client to make a list of all the regrets associated with actions toward or relationship with the deceased; process the list content toward resolution of these feelings. Assist the client in engaging in behaviors that celebrate the positive memorable aspects of the loved one and his/her life (e.g., placing memoriam in newspaper on anniversary of death, volunteering time to a favorite cause of the deceased person). Ask the client to discuss and/or list the positive aspects of and memories about his/her relationship with the lost loved one; reinforce the client's expression of positive memories and emotions (e.g., smiling, laughing); encourage the client  to share these thoughts with supportive loved ones. Educate the client on the stages of the grieving process and answer any questions he/she may have. Assist the client in identifying the stages of grief that he/she has experienced and which stage he/she is presently working through. Ask the client to bring pictures or mementos connected with his/her loss to a session and talk about them (or assign "Creating a Magazine [place]" in the Adult Psychotherapy Homework Planner by Beacher Bottoms). Assist the client in identifying and expressing feelings connected with his/her loss. Gently and sensitively explore the client's recollection of the facts of the traumatic incident and his/her cognitive and emotional reactions at the time; assess frequency, intensity, duration, and history of the client's PTSD symptoms and their impact on functioning (see "How the Trauma Affects Me" in the Adult Psychotherapy Homework Planner by Jongsma); supplement with semi-structured assessment instrument if desired (see The Anxiety Disorders Interview Schedule - Adult Version). Using Cognitive Therapy techniques, explore the client's self-talk and beliefs about self, others, and the future that are a consequence of the trauma (e.g., themes of safety, trust, power, control, esteem, and intimacy); identify and challenge biases; assist him/her in generating appraisals that correct for the biases; test biased and alternatives predictions through behavioral experiments. Teach the client a guided self-dialogue procedure in which he/she learns to recognize maladaptive self-talk, challenges its biases, copes with engendered feelings, overcomes avoidance, and reinforces his/her accomplishments; review and reinforce progress, problem-solve obstacles. Utilize Eye Movement Desensitization and Reprocessing (EMDR) to reduce the client's emotional reactivity to the traumatic event and reduce PTSD symptoms. Assess the client for instances of poor anger  management that have led to threats or actual violence that caused damage to property and/or injury to people (or  assign "Anger Journal" in the Adult Psychotherapy Homework Planner by Beacher Bottoms). Teach the client anger management techniques (see the Anger Control Problems chapter in this Planner). Develop and encourage a routine of physical exercise for the client. Teach the client calming skills (e.g., breathing retraining, relaxation, calming self-talk) to use in and between sessions when feeling overly distressed. Ask the client to describe his/her past experiences of anxiety and their impact on functioning; assess the focus, excessiveness, and uncontrollability of the worry and the type, frequency, intensity, and duration of his/her anxiety symptoms (consider using a structured interview such as The Anxiety Disorders Interview Schedule-Adult Version). Explore the client's schema and self-talk that mediate his/her fear response; assist him/her in challenging the biases; replace the distorted messages with reality-based alternatives and positive, realistic self-talk that will increase his/her self-confidence in coping with irrational fears (see Cognitive Therapy of Anxiety Disorders by Anderson Kaufman). Teach the client problem-solving strategies involving specifically defining a problem, generating options for addressing it, evaluating the pros and cons of each option, selecting and implementing an optional action, and reevaluating and refining the action (or assign "Applying Problem-Solving to Interpersonal Conflict" in the Adult Psychotherapy Homework Planner by Beacher Bottoms). Engage the client in behavioral activation, increasing the client's contact with sources of reward, identifying processes that inhibit activation, and teaching skills to solve life problems (or assign "Identify and Schedule Pleasant Activities" in the Adult Psychotherapy Homework Planner by Beacher Bottoms); use behavioral techniques such as  instruction, rehearsal, role-playing, role reversal as needed to assist adoption into the client's daily life; reinforce success. Discuss with the client the distinction between a lapse and relapse, associating a lapse with an initial and reversible return of worry, anxiety symptoms, or urges to avoid, and relapse with the decision to continue the fearful and avoidant patterns. Identify and rehearse with the client the management of future situations or circumstances in which lapses could occur. Instruct the client to routinely use new therapeutic skills (e.g., relaxation, cognitive restructuring, exposure, and problem-solving) in daily life to address emergent worries, anxiety, and avoidant tendencies. Develop a "coping card" on which coping strategies and other important information (e.g., "Breathe deeply and relax," "Challenge unrealistic worries," "Use problem-solving") are written for the client's later use. Use techniques from Acceptance and Commitment Therapy to help client accept uncomfortable realities such as lack of complete control, imperfections, and uncertainty and tolerate unpleasant emotions and thoughts in order to accomplish value-consistent goals. Discuss how generalized anxiety typically involves excessive worry about unrealistic threats, various bodily expressions of tension, overarousal, and hypervigilance, and avoidance of what is threatening that interact to maintain the problem (see Mastery of Your Anxiety and Worry: Therapist Guide by Jame Maze, and Barlow; Treating Generalized Anxiety Disorder by Rygh and Joya Nissen). Teach the client calming/relaxation skills (e.g., applied relaxation, progressive muscle relaxation, cue controlled relaxation; mindful breathing; biofeedback) and how to discriminate better between relaxation and tension; teach the client how to apply these skills to his/her daily life (e.g., New Directions in Progressive Muscle Relaxation by Fara Hone,  and Hazlett-Stevens; Treating Generalized Anxiety Disorder by Rygh and Joya Nissen). Assign the client to read about progressive muscle relaxation and other calming strategies in relevant books or treatment manuals (e.g., Progressive Relaxation Training by Rodolfo Clan and Arvil Birks; Mastery of Your Anxiety and Worry: Workbook by Rodney Clamp). Explain the rationale for using a worry time as well as how it is to be used; agree upon and implement a worry time with the client. Teach the client how to recognize, stop,  and postpone worry to the agreed upon worry time using skills such as thought stopping, relaxation, and redirecting attention (or assign "Making Use of the Thought-Stopping Technique" and/or "Worry Time" in the Adult Psychotherapy Homework Planner by Jongsma to assist skill development); encourage use in daily life; review and reinforce success while providing corrective feedback toward improvement. Assist the client in analyzing his/her worries by examining potential biases such as the probability of the negative expectation occurring, the real consequences of it occurring, his/her ability to control the outcome, the worst possible outcome, and his/her ability to accept it (see "Analyze the Probability of a Feared Event" in the Adult Psychotherapy Homework Planner by Beacher Bottoms; Cognitive Therapy of Anxiety Disorders by Anderson Kaufman). Encourage the client to share his/her thoughts and feelings of depression; express empathy and build rapport while identifying primary cognitive, behavioral, interpersonal, or other contributors to depression. Conduct Cognitive-Behavioral Therapy (see Cognitive Behavior Therapy by Kerrie Peek; Overcoming Depression by Duke Gibbons al.), beginning with helping the client learn the connection among cognition, depressive feelings, and actions. Assign the client to self-monitor thoughts, feelings, and actions in daily journal (e.g., "Negative Thoughts Trigger Negative Feelings" in  the Adult Psychotherapy Homework Planner by Beacher Bottoms; "Daily Record of Dysfunctional Thoughts" in Cognitive Therapy of Depression by Bolling Bushy and Roselle Conner); process the journal material to challenge depressive thinking patterns and replace them with reality-based thoughts. Facilitate and reinforce the client's shift from biased depressive self-talk and beliefs to reality-based cognitive messages that enhance self-confidence and increase adaptive actions (see "Positive Self-Talk" in the Adult Psychotherapy Homework Planner by Beacher Bottoms). Assist the client in developing skills that increase the likelihood of deriving pleasure from behavioral activation (e.g., assertiveness skills, developing an exercise plan, less internal/more external focus, increased social involvement); reinforce success. Conduct Interpersonal Therapy (see Interpersonal Psychotherapy of Depression by Willey Harrier al.), beginning with the assessment of the client's "interpersonal inventory" of important past and present relationships; develop a case formulation linking depression to grief, interpersonal role disputes, role transitions, and/or interpersonal deficits). Conduct Problem-Solving Therapy (see Problem-Solving Therapy by Donnice Gale and Nezu) using techniques such as psychoeducation, modeling, and role-playing to teach client problem-solving skills (i.e., defining a problem specifically, generating possible solutions, evaluating the pros and cons of each solution, selecting and implementing a plan of action, evaluating the efficacy of the plan, accepting or revising the plan); role-play application of the problem-solving skill to a real life issue (or assign "Applying Problem-Solving to Interpersonal Conflict" in the Adult Psychotherapy Homework Planner by Beacher Bottoms). Teach conflict resolution skills (e.g., empathy, active listening, "I messages," respectful communication, assertiveness without aggression, compromise); use  psychoeducation, modeling, role-playing, and rehearsal to work through several current conflicts; assign homework exercises; review and repeat so as to integrate their use into the client's life. Consistent with the treatment model, discuss how cognitive, behavioral, interpersonal, and/or other factors (e.g., family history) contribute to depression.  Diagnosis:Generalized anxiety disorder  Current mild episode of major depressive disorder without prior episode [facility])  Uncomplicated bereavement  Plan:  -meet again on Wednesday, Apr 24, 2024 at 10am virtually.

## 2024-03-29 ENCOUNTER — Encounter: Payer: Self-pay | Admitting: Family Medicine

## 2024-03-29 ENCOUNTER — Encounter (INDEPENDENT_AMBULATORY_CARE_PROVIDER_SITE_OTHER): Payer: Self-pay

## 2024-04-01 ENCOUNTER — Encounter: Payer: Self-pay | Admitting: Family Medicine

## 2024-04-01 ENCOUNTER — Other Ambulatory Visit: Payer: Self-pay | Admitting: Family Medicine

## 2024-04-01 DIAGNOSIS — R2681 Unsteadiness on feet: Secondary | ICD-10-CM

## 2024-04-01 DIAGNOSIS — I69351 Hemiplegia and hemiparesis following cerebral infarction affecting right dominant side: Secondary | ICD-10-CM

## 2024-04-10 ENCOUNTER — Ambulatory Visit: Payer: PRIVATE HEALTH INSURANCE | Admitting: Professional

## 2024-04-17 ENCOUNTER — Other Ambulatory Visit: Payer: Self-pay | Admitting: Family Medicine

## 2024-04-17 DIAGNOSIS — F419 Anxiety disorder, unspecified: Secondary | ICD-10-CM

## 2024-04-17 NOTE — Telephone Encounter (Signed)
 Medication: busPIRone  (BUSPAR ) 10 MG tablet   Has the patient contacted their pharmacy? Yes Told to contact the office.   This is the patient's preferred pharmacy:  [facility] - [place], [place] - [address] [place] [address] Phone: [phone] Fax: [phone] Is this the correct pharmacy for this prescription? Yes  Has the prescription been filled recently? No  Is the patient out of the medication? No  Has the patient been seen for an appointment in the last year OR does the patient have an upcoming appointment? Yes  Can we respond through MyChart? No  Agent: Please be advised that Rx refills may take up to 3 business days. We ask that you follow-up with your pharmacy.

## 2024-04-18 ENCOUNTER — Encounter: Payer: Self-pay | Admitting: Physical Therapy

## 2024-04-19 MED ORDER — BUSPIRONE HCL 10 MG PO TABS: 10.0000 mg | ORAL_TABLET | Freq: Two times a day (BID) | ORAL | 0 refills | Status: DC

## 2024-04-19 NOTE — Telephone Encounter (Signed)
 Requested Prescriptions  Pending Prescriptions Disp Refills   busPIRone  (BUSPAR ) 10 MG tablet 180 tablet 0    Sig: Take 1 tablet (10 mg total) by mouth 2 (two) times daily.     Psychiatry: Anxiolytics/Hypnotics - Non-controlled Failed - 04/19/2024  8:55 AM      Failed - Valid encounter within last 12 months    Recent Outpatient Visits   None     Future Appointments             In 1 month Sowles, Krichna, MD [facility], [facility]

## 2024-04-23 ENCOUNTER — Encounter (INDEPENDENT_AMBULATORY_CARE_PROVIDER_SITE_OTHER): Payer: Self-pay

## 2024-04-24 ENCOUNTER — Ambulatory Visit (INDEPENDENT_AMBULATORY_CARE_PROVIDER_SITE_OTHER): Payer: PRIVATE HEALTH INSURANCE | Admitting: Professional

## 2024-04-24 ENCOUNTER — Encounter: Payer: Self-pay | Admitting: Professional

## 2024-04-24 DIAGNOSIS — F411 Generalized anxiety disorder: Secondary | ICD-10-CM | POA: Diagnosis not present

## 2024-04-24 DIAGNOSIS — Z634 Disappearance and death of family member: Secondary | ICD-10-CM

## 2024-04-24 DIAGNOSIS — F320 Major depressive disorder, single episode, mild: Secondary | ICD-10-CM | POA: Diagnosis not present

## 2024-04-24 NOTE — Progress Notes (Signed)
  Behavioral Health Counselor/Therapist Progress Note  Patient ID: Tom Underwood, MRN: [id number],    Date: 04/24/2024  Time Spent: 49 minutes 1005-1054am   Treatment Type: Individual Therapy  Risk Assessment: Danger to Self:  No Self-injurious Behavior: No Danger to Others: No Duty to Warn:no  Subjective: This session was held via video teletherapy. The patient consented to video teletherapy and was located at his home during this session. He is aware it is the responsibility of the patient to secure confidentiality on her end of the session. The provider was in a private home office for the duration of this session.    The patient arrived on time for his Caregility appointment.   Issues addressed: 1-beach trip -cat went with them and did very well -they stayed in an oceanfront suite and the cat appeared to enjoy -wife was a lot more at ease instead of leaving her home by herself -he enjoyed himself watching the water and the beach -he did some walking in the building to get his exercise 2-mood -easily engaged 3-older brother Richard sent birthday present a-autographed baseball from his favorite Phillies pitcher -he called and thanked him for card and gift -the card said we need to make amends -pt doesn't know how to handle -he is the typical middle child b-Richard doesn't recall sending him hurtful texts -his brother told pt to not make a scene -brother was motivated by being seen -youngest brother Dee Farber ws sick and got a lot of parents time -pt would take up for [employer] -pt admits that his parents were rick in life but not in money -pt espouses values more similar to his parents -Rich Champ was motivated by money c-pt admits it has been over years that he has watched -he engaged with pt's son Jerrilyn Moras as a newborn and still stays in contact and Josh visits him in [name] d-he was not close to pt's daughter [name] was four months; it appears it may have had to  do with the most of his daughter Loyce Ruffini as a ten month old from cancer -Erin's feelings were hurt that her aunt/uncle invested time in [place] but not her -pt admits he felt resentful toward his brother/SIL -pt thinks it was the reason that he backed away-pt addressed with brother Rich Champ and he told the pt that was between him and [name] -pt's daughter [name] would visit [name]  1/yr to see her grandmother and would visit Richard and Genevia Kern while there e-pt verbalizes that his brother appeared hurt that he ws hurt when pt/his wife did not go to his wedding  Treatment Plan Problems: Anxiety, Unipolar Depression, Posttraumatic Stress Disorder (PTSD), Grief / Loss Unresolved,  Symptoms: Excessive and/or unrealistic worry that is difficult to control occurring more days than not for at least 6 months about a number of events or activities. Motor tension (e.g., restlessness, tiredness, shakiness, muscle tension). Hypervigilance (e.g., feeling constantly on edge, experiencing concentration difficulties, having trouble falling or staying asleep, exhibiting a general state of irritability). Thoughts dominated by loss coupled with poor concentration, tearful spells, and confusion about the future. Serial losses in life (i.e., deaths, divorces, jobs) that led to depression and discouragement. Strong emotional response of sadness exhibited when losses are discussed. Lack of appetite, weight loss, and/or insomnia as well as other depression signs that occurred since the loss. Has been exposed to a traumatic event involving actual or perceived threat of death or serious injury. Reports response of intense fear, helplessness, or horror to the traumatic event.  Experiences disturbing and persistent thoughts, images, and/or perceptions of the traumatic event. Displays significant psychological and/or physiological distress resulting from internal and external clues that are reminiscent of the traumatic  event. Intentionally avoids thoughts, feelings, or discussions related to the traumatic event. Intentionally avoids activities, places, people, or objects (e.g., up-armored vehicles) that evoke memories of the event. Displays a significant decline in interest and engagement in activities. Experiences disturbances in sleep. Reports difficulty concentrating as well as feelings of guilt. Reports hypervigilance. Symptoms present more than one month. Impairment in social, occupational, or other areas of functioning. Depressed or irritable mood. Decrease or loss of appetite. Diminished interest in or enjoyment of activities. Sleeplessness or hypersomnia. Lack of energy. Poor concentration and indecisiveness. Social withdrawal. Feelings of hopelessness, worthlessness, or inappropriate guilt. Unresolved grief issues. History of chronic or recurrent depression for which the client has taken antidepressant medication, been hospitalized, had outpatient treatment, or had a course of electroconvulsive therapy. Goals: Learn and implement coping skills that result in a reduction of anxiety and worry, and improved daily functioning. Enhance ability to effectively cope with the full variety of life's worries and anxieties. Stabilize anxiety level while increasing ability to function on a daily basis. Begin a healthy grieving process around the loss. Develop an awareness of how the avoidance of grieving has affected life and begin the healing process. Resolve the loss, reengaging in old relationships and initiating new contacts with others. Eliminate or reduce the negative impact trauma related symptoms have on social, occupational, and family functioning. Thinks about or openly discusses the traumatic event with others without experiencing psychological or physiological distress. No longer avoids persons, places, activities, and objects that are reminiscent of the traumatic event. Alleviate depressive  symptoms and return to previous level of effective functioning. Recognize, accept, and cope with feelings of depression. Develop healthy thinking patterns and beliefs about self, others, and the world that lead to the alleviation and help prevent the relapse of depression. Appropriately grieve the loss in order to normalize mood and to return to previously adaptive level of functioning. Objectives target date for all objectives is 11/19/2024 Describe situations, thoughts, feelings, and actions associated with anxieties and worries, their impact on functioning, and attempts to resolve them.   50% Verbalize an understanding of the cognitive, physiological, and behavioral components of anxiety and its treatment.   40% Learn and implement calming skills to reduce overall anxiety and manage anxiety symptoms.  30% Learn and implement a strategy to limit the association between various environmental settings and worry, delaying the worry until a designated "worry time."   20% Learn and implement problem-solving strategies for realistically addressing worries.   40% Identify, challenge, and replace biased, fearful self-talk with positive, realistic, and empowering self-talk.   65% Verbalize an understanding of the role that cognitive biases play in excessive irrational worry and persistent anxiety symptoms.   65% Identify and engage in pleasant activities on a daily basis.  65% Learn and implement relapse prevention strategies for managing possible future anxiety symptoms.   40% Learn to accept limitations in life and commit to tolerating, rather than avoiding, unpleasant emotions while accomplishing meaningful goals.  40% Tell in detail the story of the current loss that is triggering symptoms.   100% Identify what stages of grief have been experienced in the continuum of the grieving process.   40% (has not grieved mother's death) Begin verbalizing feelings associated with the loss.   20% Verbalize  resolution of feelings of guilt and regret  associated with the loss. 30% Identify and voice positives about the deceased loved one including previous positive experiences, positive characteristics, positive aspects of the relationship, and how these things may be remembered.   10% Learn and implement calming skills.   50% Learn and implement guided self-dialogue to manage thoughts, feelings, and urges brought on by encounters with trauma-related situations.   30% Acknowledge the need to implement anger control techniques; learn and implement anger management techniques.   30% Implement a regular exercise regimen as a stress release technique.   50% Describe current and past experiences with depression including their impact on functioning and attempts to resolve it.   70% Verbalize an accurate understanding of depression.   70% Identify and replace thoughts and beliefs that support depression.   40% Learn and implement behavioral strategies to overcome depression.   60% Identify important people in life, past and present, and describe the quality, good and poor, of those relationships.   30% Learn and implement problem-solving and decision-making skills.   30% Learn and implement conflict resolution skills to resolve interpersonal problems.   30% Interventions: Use empathy, compassion, and support, allowing the client to tell in detail the story of his/her recent loss. Assign the client to make a list of all the regrets associated with actions toward or relationship with the deceased; process the list content toward resolution of these feelings. Assist the client in engaging in behaviors that celebrate the positive memorable aspects of the loved one and his/her life (e.g., placing memoriam in newspaper on anniversary of death, volunteering time to a favorite cause of the deceased person). Ask the client to discuss and/or list the positive aspects of and memories about his/her relationship with the  lost loved one; reinforce the client's expression of positive memories and emotions (e.g., smiling, laughing); encourage the client to share these thoughts with supportive loved ones. Educate the client on the stages of the grieving process and answer any questions he/she may have. Assist the client in identifying the stages of grief that he/she has experienced and which stage he/she is presently working through. Ask the client to bring pictures or mementos connected with his/her loss to a session and talk about them (or assign "Creating a [employer]" in the Adult Psychotherapy Homework Planner by Beacher Bottoms). Assist the client in identifying and expressing feelings connected with his/her loss. Gently and sensitively explore the client's recollection of the facts of the traumatic incident and his/her cognitive and emotional reactions at the time; assess frequency, intensity, duration, and history of the client's PTSD symptoms and their impact on functioning (see "How the Trauma Affects Me" in the Adult Psychotherapy Homework Planner by Jongsma); supplement with semi-structured assessment instrument if desired (see The Anxiety Disorders Interview Schedule - Adult Version). Using Cognitive Therapy techniques, explore the client's self-talk and beliefs about self, others, and the future that are a consequence of the trauma (e.g., themes of safety, trust, power, control, esteem, and intimacy); identify and challenge biases; assist him/her in generating appraisals that correct for the biases; test biased and alternatives predictions through behavioral experiments. Teach the client a guided self-dialogue procedure in which he/she learns to recognize maladaptive self-talk, challenges its biases, copes with engendered feelings, overcomes avoidance, and reinforces his/her accomplishments; review and reinforce progress, problem-solve obstacles. Utilize Eye Movement Desensitization and Reprocessing (EMDR) to reduce  the client's emotional reactivity to the traumatic event and reduce PTSD symptoms. Assess the client for instances of poor anger management that have led to threats or  actual violence that caused damage to property and/or injury to people (or assign "Anger Journal" in the Adult Psychotherapy Homework Planner by Beacher Bottoms). Teach the client anger management techniques (see the Anger Control Problems chapter in this Planner). Develop and encourage a routine of physical exercise for the client. Teach the client calming skills (e.g., breathing retraining, relaxation, calming self-talk) to use in and between sessions when feeling overly distressed. Ask the client to describe his/her past experiences of anxiety and their impact on functioning; assess the focus, excessiveness, and uncontrollability of the worry and the type, frequency, intensity, and duration of his/her anxiety symptoms (consider using a structured interview such as The Anxiety Disorders Interview Schedule-Adult Version). Explore the client's schema and self-talk that mediate his/her fear response; assist him/her in challenging the biases; replace the distorted messages with reality-based alternatives and positive, realistic self-talk that will increase his/her self-confidence in coping with irrational fears (see Cognitive Therapy of Anxiety Disorders by Anderson Kaufman). Teach the client problem-solving strategies involving specifically defining a problem, generating options for addressing it, evaluating the pros and cons of each option, selecting and implementing an optional action, and reevaluating and refining the action (or assign "Applying Problem-Solving to Interpersonal Conflict" in the Adult Psychotherapy Homework Planner by Beacher Bottoms). Engage the client in behavioral activation, increasing the client's contact with sources of reward, identifying processes that inhibit activation, and teaching skills to solve life problems (or assign "Identify  and Schedule Pleasant Activities" in the Adult Psychotherapy Homework Planner by Beacher Bottoms); use behavioral techniques such as instruction, rehearsal, role-playing, role reversal as needed to assist adoption into the client's daily life; reinforce success. Discuss with the client the distinction between a lapse and relapse, associating a lapse with an initial and reversible return of worry, anxiety symptoms, or urges to avoid, and relapse with the decision to continue the fearful and avoidant patterns. Identify and rehearse with the client the management of future situations or circumstances in which lapses could occur. Instruct the client to routinely use new therapeutic skills (e.g., relaxation, cognitive restructuring, exposure, and problem-solving) in daily life to address emergent worries, anxiety, and avoidant tendencies. Develop a "coping card" on which coping strategies and other important information (e.g., "Breathe deeply and relax," "Challenge unrealistic worries," "Use problem-solving") are written for the client's later use. Use techniques from Acceptance and Commitment Therapy to help client accept uncomfortable realities such as lack of complete control, imperfections, and uncertainty and tolerate unpleasant emotions and thoughts in order to accomplish value-consistent goals. Discuss how generalized anxiety typically involves excessive worry about unrealistic threats, various bodily expressions of tension, overarousal, and hypervigilance, and avoidance of what is threatening that interact to maintain the problem (see Mastery of Your Anxiety and Worry: Therapist Guide by Jame Maze, and Barlow; Treating Generalized Anxiety Disorder by Rygh and Joya Nissen). Teach the client calming/relaxation skills (e.g., applied relaxation, progressive muscle relaxation, cue controlled relaxation; mindful breathing; biofeedback) and how to discriminate better between relaxation and tension; teach the client  how to apply these skills to his/her daily life (e.g., New Directions in Progressive Muscle Relaxation by Fara Hone, and Hazlett-Stevens; Treating Generalized Anxiety Disorder by Rygh and Joya Nissen). Assign the client to read about progressive muscle relaxation and other calming strategies in relevant books or treatment manuals (e.g., Progressive Relaxation Training by Rodolfo Clan and Arvil Birks; Mastery of Your Anxiety and Worry: Workbook by Rodney Clamp). Explain the rationale for using a worry time as well as how it is to be used; agree upon and implement a  worry time with the client. Teach the client how to recognize, stop, and postpone worry to the agreed upon worry time using skills such as thought stopping, relaxation, and redirecting attention (or assign "Making Use of the Thought-Stopping Technique" and/or "Worry Time" in the Adult Psychotherapy Homework Planner by Jongsma to assist skill development); encourage use in daily life; review and reinforce success while providing corrective feedback toward improvement. Assist the client in analyzing his/her worries by examining potential biases such as the probability of the negative expectation occurring, the real consequences of it occurring, his/her ability to control the outcome, the worst possible outcome, and his/her ability to accept it (see "Analyze the Probability of a Feared Event" in the Adult Psychotherapy Homework Planner by Beacher Bottoms; Cognitive Therapy of Anxiety Disorders by Anderson Kaufman). Encourage the client to share his/her thoughts and feelings of depression; express empathy and build rapport while identifying primary cognitive, behavioral, interpersonal, or other contributors to depression. Conduct Cognitive-Behavioral Therapy (see Cognitive Behavior Therapy by Kerrie Peek; Overcoming Depression by Duke Gibbons al.), beginning with helping the client learn the connection among cognition, depressive feelings, and actions. Assign the  client to self-monitor thoughts, feelings, and actions in daily journal (e.g., "Negative Thoughts Trigger Negative Feelings" in the Adult Psychotherapy Homework Planner by Beacher Bottoms; "Daily Record of Dysfunctional Thoughts" in Cognitive Therapy of Depression by Bolling Bushy and Roselle Conner); process the journal material to challenge depressive thinking patterns and replace them with reality-based thoughts. Facilitate and reinforce the client's shift from biased depressive self-talk and beliefs to reality-based cognitive messages that enhance self-confidence and increase adaptive actions (see "Positive Self-Talk" in the Adult Psychotherapy Homework Planner by Beacher Bottoms). Assist the client in developing skills that increase the likelihood of deriving pleasure from behavioral activation (e.g., assertiveness skills, developing an exercise plan, less internal/more external focus, increased social involvement); reinforce success. Conduct Interpersonal Therapy (see Interpersonal Psychotherapy of Depression by Willey Harrier al.), beginning with the assessment of the client's "interpersonal inventory" of important past and present relationships; develop a case formulation linking depression to grief, interpersonal role disputes, role transitions, and/or interpersonal deficits). Conduct Problem-Solving Therapy (see Problem-Solving Therapy by Donnice Gale and Nezu) using techniques such as psychoeducation, modeling, and role-playing to teach client problem-solving skills (i.e., defining a problem specifically, generating possible solutions, evaluating the pros and cons of each solution, selecting and implementing a plan of action, evaluating the efficacy of the plan, accepting or revising the plan); role-play application of the problem-solving skill to a real life issue (or assign "Applying Problem-Solving to Interpersonal Conflict" in the Adult Psychotherapy Homework Planner by Beacher Bottoms). Teach conflict resolution skills (e.g.,  empathy, active listening, "I messages," respectful communication, assertiveness without aggression, compromise); use psychoeducation, modeling, role-playing, and rehearsal to work through several current conflicts; assign homework exercises; review and repeat so as to integrate their use into the client's life. Consistent with the treatment model, discuss how cognitive, behavioral, interpersonal, and/or other factors (e.g., family history) contribute to depression.  Diagnosis:Generalized anxiety disorder  Current mild episode of major depressive disorder without prior episode [facility])  Uncomplicated bereavement  Plan:  -decide what you want to do about his relationship with his brother -meet again on Wednesday, May 08, 2024 at 10am virtually.

## 2024-05-07 ENCOUNTER — Ambulatory Visit: Attending: Family Medicine | Admitting: Physical Therapy

## 2024-05-07 DIAGNOSIS — R269 Unspecified abnormalities of gait and mobility: Secondary | ICD-10-CM | POA: Insufficient documentation

## 2024-05-07 DIAGNOSIS — R279 Unspecified lack of coordination: Secondary | ICD-10-CM | POA: Diagnosis not present

## 2024-05-07 DIAGNOSIS — I69351 Hemiplegia and hemiparesis following cerebral infarction affecting right dominant side: Secondary | ICD-10-CM | POA: Diagnosis not present

## 2024-05-07 DIAGNOSIS — R2689 Other abnormalities of gait and mobility: Secondary | ICD-10-CM | POA: Insufficient documentation

## 2024-05-07 DIAGNOSIS — R262 Difficulty in walking, not elsewhere classified: Secondary | ICD-10-CM | POA: Insufficient documentation

## 2024-05-07 DIAGNOSIS — M25661 Stiffness of right knee, not elsewhere classified: Secondary | ICD-10-CM | POA: Diagnosis not present

## 2024-05-07 DIAGNOSIS — M25662 Stiffness of left knee, not elsewhere classified: Secondary | ICD-10-CM | POA: Diagnosis not present

## 2024-05-07 DIAGNOSIS — M6281 Muscle weakness (generalized): Secondary | ICD-10-CM | POA: Insufficient documentation

## 2024-05-07 DIAGNOSIS — R2681 Unsteadiness on feet: Secondary | ICD-10-CM | POA: Diagnosis not present

## 2024-05-08 ENCOUNTER — Encounter: Payer: Self-pay | Admitting: Professional

## 2024-05-08 ENCOUNTER — Ambulatory Visit (INDEPENDENT_AMBULATORY_CARE_PROVIDER_SITE_OTHER): Payer: PRIVATE HEALTH INSURANCE | Admitting: Professional

## 2024-05-08 DIAGNOSIS — Z634 Disappearance and death of family member: Secondary | ICD-10-CM

## 2024-05-08 DIAGNOSIS — F320 Major depressive disorder, single episode, mild: Secondary | ICD-10-CM | POA: Diagnosis not present

## 2024-05-08 DIAGNOSIS — F411 Generalized anxiety disorder: Secondary | ICD-10-CM | POA: Diagnosis not present

## 2024-05-08 NOTE — Progress Notes (Signed)
 [facility] Counselor/Therapist Progress Note  Patient ID: Tom Underwood, MRN: [id number],    Date: 05/08/2024  Time Spent: 45 minutes 1003-1048am   Treatment Type: Individual Therapy  Risk Assessment: Danger to Self:  No Self-injurious Behavior: No Danger to Others: No Duty to Warn:no  Subjective: This session was held via video teletherapy. The patient consented to video teletherapy and was located at his home during this session. He is aware it is the responsibility of the patient to secure confidentiality on her end of the session. The provider was in a private home office for the duration of this session.    The patient arrived on time for his Caregility appointment.   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 -he shared that he thinks his brother has hard feelings about pt/wife not going to his wedding -pt reportedly explained that he and [place] Abed [place] -discussed importance of not "one-upping" or doing "tit for tat" with his brother -pt admits that he was hurt that his brother did not come to his wedding to Abe Abed that occurred years Richard's wedding -pt is not ready to many any decisions related to contact with his brother 2-mood -easily engaged 3-PT -pt was ordered by PCP to return to PT  -he is attending two days per week and it has been helpful -he reports that he is doing the exercises at home as well -he did not have access to a bike at home and his wife has now bought one for him at a yard sale -pt reports he can now duplicate his exercise regime at home  Treatment Plan Problems: Anxiety, Unipolar Depression, Posttraumatic Stress Disorder  (PTSD), Grief / Loss Unresolved,  Symptoms: Excessive and/or unrealistic worry that is difficult to control occurring more days than not for at least 6 months about a number of events or activities. Motor tension (e.g., restlessness, tiredness, shakiness, muscle tension). Hypervigilance (e.g., feeling constantly on edge, experiencing concentration difficulties, having trouble falling or staying asleep, exhibiting a general state of irritability). Thoughts dominated by loss coupled with poor concentration, tearful spells, and confusion about the future. Serial losses in life (i.e., deaths, divorces, jobs) that led to depression and discouragement. Strong emotional response of sadness exhibited when losses are discussed. Lack of appetite, weight loss, and/or insomnia as well as other depression signs that occurred since the loss. Has been exposed to a traumatic event involving actual or perceived threat of death or serious injury. Reports response of intense fear, helplessness, or horror to the traumatic event. Experiences disturbing and persistent thoughts, images, and/or perceptions of the traumatic event. Displays significant psychological and/or physiological distress resulting from internal and external clues that are reminiscent of the traumatic event. Intentionally avoids thoughts, feelings, or discussions related to the traumatic event. Intentionally avoids activities, places, people, or objects (e.g., up-armored vehicles) that evoke memories of the event. Displays a significant decline in interest and engagement in activities. Experiences disturbances in sleep. Reports difficulty concentrating as well as feelings of guilt. Reports hypervigilance. Symptoms present more than one month. Impairment in social, occupational, or other areas of functioning. Depressed or irritable mood. Decrease or loss of appetite. Diminished interest in or enjoyment of  activities. Sleeplessness or  hypersomnia. Lack of energy. Poor concentration and indecisiveness. Social withdrawal. Feelings of hopelessness, worthlessness, or inappropriate guilt. Unresolved grief issues. History of chronic or recurrent depression for which the client has taken antidepressant medication, been hospitalized, had outpatient treatment, or had a course of electroconvulsive therapy. Goals: Learn and implement coping skills that result in a reduction of anxiety and worry, and improved daily functioning. Enhance ability to effectively cope with the full variety of life's worries and anxieties. Stabilize anxiety level while increasing ability to function on a daily basis. Begin a healthy grieving process around the loss. Develop an awareness of how the avoidance of grieving has affected life and begin the healing process. Resolve the loss, reengaging in old relationships and initiating new contacts with others. Eliminate or reduce the negative impact trauma related symptoms have on social, occupational, and family functioning. Thinks about or openly discusses the traumatic event with others without experiencing psychological or physiological distress. No longer avoids persons, places, activities, and objects that are reminiscent of the traumatic event. Alleviate depressive symptoms and return to previous level of effective functioning. Recognize, accept, and cope with feelings of depression. Develop healthy thinking patterns and beliefs about self, others, and the world that lead to the alleviation and help prevent the relapse of depression. Appropriately grieve the loss in order to normalize mood and to return to previously adaptive level of functioning. Objectives target date for all objectives is 11/19/2024 Describe situations, thoughts, feelings, and actions associated with anxieties and worries, their impact on functioning, and attempts to resolve them.   50% Verbalize an understanding of the cognitive,  physiological, and behavioral components of anxiety and its treatment.   40% Learn and implement calming skills to reduce overall anxiety and manage anxiety symptoms.  30% Learn and implement a strategy to limit the association between various environmental settings and worry, delaying the worry until a designated "worry time."   20% Learn and implement problem-solving strategies for realistically addressing worries.   40% Identify, challenge, and replace biased, fearful self-talk with positive, realistic, and empowering self-talk.   65% Verbalize an understanding of the role that cognitive biases play in excessive irrational worry and persistent anxiety symptoms.   65% Identify and engage in pleasant activities on a daily basis.  65% Learn and implement relapse prevention strategies for managing possible future anxiety symptoms.   40% Learn to accept limitations in life and commit to tolerating, rather than avoiding, unpleasant emotions while accomplishing meaningful goals.  40% Tell in detail the story of the current loss that is triggering symptoms.   100% Identify what stages of grief have been experienced in the continuum of the grieving process.   40% (has not grieved mother's death) Begin verbalizing feelings associated with the loss.   20% Verbalize resolution of feelings of guilt and regret associated with the loss. 30% Identify and voice positives about the deceased loved one including previous positive experiences, positive characteristics, positive aspects of the relationship, and how these things may be remembered.   10% Learn and implement calming skills.   50% Learn and implement guided self-dialogue to manage thoughts, feelings, and urges brought on by encounters with trauma-related situations.   30% Acknowledge the need to implement anger control techniques; learn and implement anger management techniques.   30% Implement a regular exercise regimen as a stress release technique.    50% Describe current and past experiences with depression including their impact on functioning and attempts to resolve it.  70% Verbalize an accurate understanding of depression.   70% Identify and replace thoughts and beliefs that support depression.   40% Learn and implement behavioral strategies to overcome depression.   60% Identify important people in life, past and present, and describe the quality, good and poor, of those relationships.   30% Learn and implement problem-solving and decision-making skills.   30% Learn and implement conflict resolution skills to resolve interpersonal problems.   30% Interventions: Use empathy, compassion, and support, allowing the client to tell in detail the story of his/her recent loss. Assign the client to make a list of all the regrets associated with actions toward or relationship with the deceased; process the list content toward resolution of these feelings. Assist the client in engaging in behaviors that celebrate the positive memorable aspects of the loved one and his/her life (e.g., placing memoriam in newspaper on anniversary of death, volunteering time to a favorite cause of the deceased person). Ask the client to discuss and/or list the positive aspects of and memories about his/her relationship with the lost loved one; reinforce the client's expression of positive memories and emotions (e.g., smiling, laughing); encourage the client to share these thoughts with supportive loved ones. Educate the client on the stages of the grieving process and answer any questions he/she may have. Assist the client in identifying the stages of grief that he/she has experienced and which stage he/she is presently working through. Ask the client to bring pictures or mementos connected with his/her loss to a session and talk about them (or assign "Creating a [place]" in the Adult Psychotherapy Homework Planner by Beacher Bottoms). Assist the client in identifying  and expressing feelings connected with his/her loss. Gently and sensitively explore the client's recollection of the facts of the traumatic incident and his/her cognitive and emotional reactions at the time; assess frequency, intensity, duration, and history of the client's PTSD symptoms and their impact on functioning (see "How the Trauma Affects Me" in the Adult Psychotherapy Homework Planner by Jongsma); supplement with semi-structured assessment instrument if desired (see The Anxiety Disorders Interview Schedule - Adult Version). Using Cognitive Therapy techniques, explore the client's self-talk and beliefs about self, others, and the future that are a consequence of the trauma (e.g., themes of safety, trust, power, control, esteem, and intimacy); identify and challenge biases; assist him/her in generating appraisals that correct for the biases; test biased and alternatives predictions through behavioral experiments. Teach the client a guided self-dialogue procedure in which he/she learns to recognize maladaptive self-talk, challenges its biases, copes with engendered feelings, overcomes avoidance, and reinforces his/her accomplishments; review and reinforce progress, problem-solve obstacles. Utilize Eye Movement Desensitization and Reprocessing (EMDR) to reduce the client's emotional reactivity to the traumatic event and reduce PTSD symptoms. Assess the client for instances of poor anger management that have led to threats or actual violence that caused damage to property and/or injury to people (or assign "Anger Journal" in the Adult Psychotherapy Homework Planner by Beacher Bottoms). Teach the client anger management techniques (see the Anger Control Problems chapter in this Planner). Develop and encourage a routine of physical exercise for the client. Teach the client calming skills (e.g., breathing retraining, relaxation, calming self-talk) to use in and between sessions when feeling overly distressed. Ask  the client to describe his/her past experiences of anxiety and their impact on functioning; assess the focus, excessiveness, and uncontrollability of the worry and the type, frequency, intensity, and duration of his/her anxiety symptoms (consider using a structured interview such as The  Anxiety Disorders Interview Schedule-Adult Version). Explore the client's schema and self-talk that mediate his/her fear response; assist him/her in challenging the biases; replace the distorted messages with reality-based alternatives and positive, realistic self-talk that will increase his/her self-confidence in coping with irrational fears (see Cognitive Therapy of Anxiety Disorders by Anderson Kaufman). Teach the client problem-solving strategies involving specifically defining a problem, generating options for addressing it, evaluating the pros and cons of each option, selecting and implementing an optional action, and reevaluating and refining the action (or assign "Applying Problem-Solving to Interpersonal Conflict" in the Adult Psychotherapy Homework Planner by Beacher Bottoms). Engage the client in behavioral activation, increasing the client's contact with sources of reward, identifying processes that inhibit activation, and teaching skills to solve life problems (or assign "Identify and Schedule Pleasant Activities" in the Adult Psychotherapy Homework Planner by Beacher Bottoms); use behavioral techniques such as instruction, rehearsal, role-playing, role reversal as needed to assist adoption into the client's daily life; reinforce success. Discuss with the client the distinction between a lapse and relapse, associating a lapse with an initial and reversible return of worry, anxiety symptoms, or urges to avoid, and relapse with the decision to continue the fearful and avoidant patterns. Identify and rehearse with the client the management of future situations or circumstances in which lapses could occur. Instruct the client to  routinely use new therapeutic skills (e.g., relaxation, cognitive restructuring, exposure, and problem-solving) in daily life to address emergent worries, anxiety, and avoidant tendencies. Develop a "coping card" on which coping strategies and other important information (e.g., "Breathe deeply and relax," "Challenge unrealistic worries," "Use problem-solving") are written for the client's later use. Use techniques from Acceptance and Commitment Therapy to help client accept uncomfortable realities such as lack of complete control, imperfections, and uncertainty and tolerate unpleasant emotions and thoughts in order to accomplish value-consistent goals. Discuss how generalized anxiety typically involves excessive worry about unrealistic threats, various bodily expressions of tension, overarousal, and hypervigilance, and avoidance of what is threatening that interact to maintain the problem (see Mastery of Your Anxiety and Worry: Therapist Guide by Jame Maze, and Barlow; Treating Generalized Anxiety Disorder by Rygh and Joya Nissen). Teach the client calming/relaxation skills (e.g., applied relaxation, progressive muscle relaxation, cue controlled relaxation; mindful breathing; biofeedback) and how to discriminate better between relaxation and tension; teach the client how to apply these skills to his/her daily life (e.g., New Directions in Progressive Muscle Relaxation by Fara Hone, and Hazlett-Stevens; Treating Generalized Anxiety Disorder by Rygh and Joya Nissen). Assign the client to read about progressive muscle relaxation and other calming strategies in relevant books or treatment manuals (e.g., Progressive Relaxation Training by Rodolfo Clan and Arvil Birks; Mastery of Your Anxiety and Worry: Workbook by Rodney Clamp). Explain the rationale for using a worry time as well as how it is to be used; agree upon and implement a worry time with the client. Teach the client how to recognize, stop, and  postpone worry to the agreed upon worry time using skills such as thought stopping, relaxation, and redirecting attention (or assign "Making Use of the Thought-Stopping Technique" and/or "Worry Time" in the Adult Psychotherapy Homework Planner by Jongsma to assist skill development); encourage use in daily life; review and reinforce success while providing corrective feedback toward improvement. Assist the client in analyzing his/her worries by examining potential biases such as the probability of the negative expectation occurring, the real consequences of it occurring, his/her ability to control the outcome, the worst possible outcome, and his/her ability to accept it (see "Analyze  the Probability of a Feared Event" in the Adult Psychotherapy Homework Planner by Beacher Bottoms; Cognitive Therapy of Anxiety Disorders by Anderson Kaufman). Encourage the client to share his/her thoughts and feelings of depression; express empathy and build rapport while identifying primary cognitive, behavioral, interpersonal, or other contributors to depression. Conduct Cognitive-Behavioral Therapy (see Cognitive Behavior Therapy by Kerrie Peek; Overcoming Depression by Duke Gibbons al.), beginning with helping the client learn the connection among cognition, depressive feelings, and actions. Assign the client to self-monitor thoughts, feelings, and actions in daily journal (e.g., "Negative Thoughts Trigger Negative Feelings" in the Adult Psychotherapy Homework Planner by Beacher Bottoms; "Daily Record of Dysfunctional Thoughts" in Cognitive Therapy of Depression by Bolling Bushy and Roselle Conner); process the journal material to challenge depressive thinking patterns and replace them with reality-based thoughts. Facilitate and reinforce the client's shift from biased depressive self-talk and beliefs to reality-based cognitive messages that enhance self-confidence and increase adaptive actions (see "Positive Self-Talk" in the Adult Psychotherapy Homework  Planner by Beacher Bottoms). Assist the client in developing skills that increase the likelihood of deriving pleasure from behavioral activation (e.g., assertiveness skills, developing an exercise plan, less internal/more external focus, increased social involvement); reinforce success. Conduct Interpersonal Therapy (see Interpersonal Psychotherapy of Depression by Willey Harrier al.), beginning with the assessment of the client's "interpersonal inventory" of important past and present relationships; develop a case formulation linking depression to grief, interpersonal role disputes, role transitions, and/or interpersonal deficits). Conduct Problem-Solving Therapy (see Problem-Solving Therapy by Donnice Gale and Nezu) using techniques such as psychoeducation, modeling, and role-playing to teach client problem-solving skills (i.e., defining a problem specifically, generating possible solutions, evaluating the pros and cons of each solution, selecting and implementing a plan of action, evaluating the efficacy of the plan, accepting or revising the plan); role-play application of the problem-solving skill to a real life issue (or assign "Applying Problem-Solving to Interpersonal Conflict" in the Adult Psychotherapy Homework Planner by Beacher Bottoms). Teach conflict resolution skills (e.g., empathy, active listening, "I messages," respectful communication, assertiveness without aggression, compromise); use psychoeducation, modeling, role-playing, and rehearsal to work through several current conflicts; assign homework exercises; review and repeat so as to integrate their use into the client's life. Consistent with the treatment model, discuss how cognitive, behavioral, interpersonal, and/or other factors (e.g., family history) contribute to depression.  Diagnosis:Generalized anxiety disorder  Current mild episode of major depressive disorder without prior episode [facility])  Uncomplicated bereavement  Plan:  -meet again on  Wednesday, May 22, 2024 at 10am virtually.

## 2024-05-14 ENCOUNTER — Ambulatory Visit: Admitting: Physical Therapy

## 2024-05-14 ENCOUNTER — Encounter: Payer: Self-pay | Admitting: Physical Therapy

## 2024-05-14 DIAGNOSIS — R269 Unspecified abnormalities of gait and mobility: Secondary | ICD-10-CM | POA: Diagnosis not present

## 2024-05-14 DIAGNOSIS — M6281 Muscle weakness (generalized): Secondary | ICD-10-CM

## 2024-05-14 DIAGNOSIS — M25662 Stiffness of left knee, not elsewhere classified: Secondary | ICD-10-CM | POA: Diagnosis not present

## 2024-05-14 DIAGNOSIS — R262 Difficulty in walking, not elsewhere classified: Secondary | ICD-10-CM

## 2024-05-14 DIAGNOSIS — R2681 Unsteadiness on feet: Secondary | ICD-10-CM

## 2024-05-14 DIAGNOSIS — M25661 Stiffness of right knee, not elsewhere classified: Secondary | ICD-10-CM | POA: Diagnosis not present

## 2024-05-14 DIAGNOSIS — R279 Unspecified lack of coordination: Secondary | ICD-10-CM

## 2024-05-14 NOTE — Therapy (Signed)
 OUTPATIENT PHYSICAL THERAPY LOWER EXTREMITY TREATMENT  Patient Name: Tom Underwood MRN: [id number] DOB:1950-12-22, 73 y.o., male Today's Date: 05/15/2024  END OF SESSION:  PT End of Session - 05/14/24 1433     Visit Number 2    Number of Visits 24    Date for PT Re-Evaluation 07/30/24    PT Start Time 1445    PT Stop Time 1540    PT Time Calculation (min) 55 min    Activity Tolerance Patient tolerated treatment well    Behavior During Therapy WFL for tasks assessed/performed            Past Medical History:  Diagnosis Date   Anxiety    Arthritis    Atrial fibrillation (HCC)    a.) CHA2DS2-VASc = 4 (age, HTN, CVA x2). b.) s/p 200 J synchronized cardioversion (DCCV) on 12/08/2021. c.) rate/rythum maintained on oral metoprolol  tartrate; chronically anticoagulated using rivaroxaban .   BPH (benign prostatic hyperplasia)    DDD (degenerative disc disease), lumbar    Decreased libido    Depression    Diverticulitis    Diverticulosis    GERD (gastroesophageal reflux disease)    Hepatic steatosis    History of 2019 novel coronavirus disease (COVID-19) 12/28/2020   History of cardiac murmur in childhood    History of kidney stones    HLD (hyperlipidemia)    Hypertension    Hypogonadism in male    OSA on CPAP    Pre-diabetes    Rhinitis, allergic    Stroke (HCC) 07/23/2019   a.) LEFT posterior cor radiata. b.) residual RIGHT sided weakness   Past Surgical History:  Procedure Laterality Date   CARDIOVERSION N/A 12/08/2021   Procedure: CARDIOVERSION;  Surgeon: Sammy Crisp, MD;  Location: [facility] ORS;  Service: Cardiovascular;  Laterality: N/A;   COLONOSCOPY     COLONOSCOPY WITH PROPOFOL  N/A 10/06/2023   Procedure: COLONOSCOPY WITH PROPOFOL ;  Surgeon: Luke Salaam, MD;  Location: [facility] ENDOSCOPY;  Service: Gastroenterology;  Laterality: N/A;   EVALUATION UNDER ANESTHESIA WITH HEMORRHOIDECTOMY N/A 04/15/2022   Procedure: EXAM UNDER ANESTHESIA WITH HEMORRHOIDECTOMY;   Surgeon: Conrado Delay, DO;  Location: [facility] ORS;  Service: General;  Laterality: N/A;   EXTERNAL EAR SURGERY     EYE SURGERY     FINGER SURGERY Right    lateration to 5 th digit   KNEE SURGERY     left eye     POLYPECTOMY  10/06/2023   Procedure: POLYPECTOMY;  Surgeon: Luke Salaam, MD;  Location: [facility] ENDOSCOPY;  Service: Gastroenterology;;   RECTAL EXAM UNDER ANESTHESIA N/A 04/23/2022   Procedure: RECTAL EXAM UNDER ANESTHESIA WITH LIGATION OF BLEEDING;  Surgeon: Emmalene Hare, MD;  Location: [facility] ORS;  Service: General;  Laterality: N/A;   Patient Active Problem List   Diagnosis Date Noted   History of atrial fibrillation 11/22/2023   Major depression, recurrent, chronic (HCC) 11/22/2023   Adenomatous polyp of colon 10/06/2023   Gastroesophageal reflux disease without esophagitis 05/17/2022   History of syncope 05/17/2022   History of hemorrhoidectomy 05/17/2022   Vitamin D  deficiency 05/17/2022   Lower extremity edema 05/17/2022   History of rectal bleeding 05/17/2022   Morbid obesity (HCC)    Hypertension    Hemiparesis of right dominant side as late effect of cerebral infarction (HCC) 08/13/2019   History of kidney stones 05/02/2018   ED (erectile dysfunction) 05/02/2018   Hyperglycemia 01/30/2017   Arthritis of knee, degenerative 01/30/2017   Benign prostatic hyperplasia without lower urinary tract symptoms 02/03/2016  Dyslipidemia 08/04/2015   OSA on CPAP 06/30/2015    PCP: Arleen Lacer, MD  REFERRING PROVIDER: Arleen Lacer, MD  REFERRING DIAG:  [phone] (ICD-10-CM) - Hemiparesis of right dominant side as late effect of cerebral infarction (HCC)  R26.81 (ICD-10-CM) - Gait instability    THERAPY DIAG:  Difficulty in walking, not elsewhere classified  Muscle weakness (generalized)  Abnormality of gait and mobility  Unsteadiness on feet  Joint stiffness of both knees  Unspecified lack of coordination  Rationale for Evaluation and Treatment:  Rehabilitation  ONSET DATE: chronic  SUBJECTIVE:   SUBJECTIVE STATEMENT: Pt. Reports <5/10 B knee pain walking around PT clinic.  Pt. States he has been walking but limited by knee pain/ endurance.  Pt. Referred back to PT by MD since discharge from PT a few months ago due to regression in LE strength/ endurance.  Pt. Had a R sided stroke on 08/23/2019 and lives with wife and uses [facility] for safety with walking.  Pt. Fearful with walking on varying terrain/ curbs.  No recent falls reported.    PERTINENT HISTORY: Pt. Known well to PT clinic.    PAIN:  Are you having pain? Yes: NPRS scale: 4-5/10  Pain location: B knees Pain description: achy/ persistent Aggravating factors: walking/ step ups Relieving factors: rest  PRECAUTIONS: Fall  RED FLAGS: None   WEIGHT BEARING RESTRICTIONS: No  FALLS:  Has patient fallen in last 6 months? No  LIVING ENVIRONMENT: Lives with: lives with their spouse Lives in: Mobile home Stairs: Yes: External: 5 steps; can reach both Has following equipment at home: Single point cane  OCCUPATION: Retired  PLOF: Independent with household mobility with device  PATIENT GOALS: Increase R sided muscle strength/ walking endurance/ balance.    NEXT MD VISIT: PRN  OBJECTIVE:  Note: Objective measures were completed at Evaluation unless otherwise noted.  PATIENT SURVEYS:  LEFS: 18 out of 80.    COGNITION: Overall cognitive status: Within functional limits for tasks assessed     SENSATION: Light touch: Impaired   EDEMA:  Moderate B lower leg swelling.  Pt. Has compression stockings/ lymphedema pumps.  Marked improvement in lower leg swelling as compared to last PT tx. Session.     MUSCLE LENGTH: Hamstrings: NT Thomas test: Unable to test due to limited supine positioning  POSTURE: rounded shoulders, forward head, and flexed trunk   PALPATION: (+) B knee joint line and lower leg tenderness.  Pitting edema noted in B lower legs (slightly improved  from last PT tx. Session several months ago).    LOWER EXTREMITY ROM:  Active ROM Right eval Left eval  Hip flexion <90 deg. <90 deg.  Hip extension    Hip abduction    Hip adduction    Hip internal rotation    Hip external rotation    Knee flexion    Knee extension    Ankle dorsiflexion    Ankle plantarflexion    Ankle inversion    Ankle eversion     (Blank rows = not tested)  LOWER EXTREMITY MMT:  MMT Right eval Left eval  Hip flexion 4- 4  Hip extension    Hip abduction 4 4+  Hip adduction    Hip internal rotation 4+ 4+  Hip external rotation 4 4  Knee flexion 5 5  Knee extension 5 5  Ankle dorsiflexion    Ankle plantarflexion    Ankle inversion    Ankle eversion     (Blank rows = not tested)  LOWER  EXTREMITY SPECIAL TESTS:  NT  FUNCTIONAL TESTS:  5 times sit to stand: 23.44 sec. (1st attempt), 12.67 sec. (2nd attempt).   Berg Balance Scale: TBD  GAIT: Distance walked: in clinic/ outside on sidewalk Assistive device utilized: Single point cane Level of assistance: Modified independence Comments: slow gait pattern with decreased arm swing, heel strike, reciprocal stride length. R foot ER.  No LOB during tx. Session.  Several seated rest breaks (vital WNL)                                                                                                     TREATMENT DATE: 05/15/2024  Subjective:  Pt. Arrived to PT with use of SPC and continued complaints of B knee discomfort.  Pt. Reports no falls or LOB.    There.ex.:  No charge Nustep L6 10 min. B UE/LE (0.52 miles)- consistent cadence.    There.act.:  Walking in //-bars with 6 hurdle step overs (3 laps).    Lateral walking in //-bars with cuing to increase upright posture (3 laps).  HR: 74 bpm/ O2 sat. 94%  Walking in //-bars with added alt. UE/LE touches 2 laps (standing rest break after initial lap).   Walking in gym to bathroom while carrying [facility] and focusing on consistent step pattern/  length.  Pt. Tends to ambulate with stiff gait and limited arm swing when not using assistive device.    Walking in hallway/ outside of car with use of SPC and posture correction.    Discussed HEP   PATIENT EDUCATION:  Education details: Discussed walking program/ home ex. Person educated: Patient Education method: Medical illustrator Education comprehension: verbalized understanding and returned demonstration  HOME EXERCISE PROGRAM: Will issue next tx.  ASSESSMENT:  CLINICAL IMPRESSION: Pt. Ambulates with use of SPC in L hand with a consistent 2-point gait reciprocal gait pattern.  Pt. Working on increasing hip flexion/ step overs in //-bars with no UE assist.  Limited B arm swing, step length and knee flexion/ heel strike while walking without assistive device.  Pt. Has difficulty with daily mobility due to generalized deconditioning/ weakness.  Pt. Will benefit from skilled PT services to increase strength/ coordination/ mobility and overall safety with daily activities.    OBJECTIVE IMPAIRMENTS: Abnormal gait, cardiopulmonary status limiting activity, decreased activity tolerance, decreased balance, decreased coordination, decreased endurance, decreased mobility, difficulty walking, decreased ROM, decreased strength, hypomobility, impaired flexibility, impaired sensation, impaired UE functional use, improper body mechanics, postural dysfunction, obesity, and pain.   ACTIVITY LIMITATIONS: carrying, lifting, bending, standing, squatting, stairs, transfers, bed mobility, bathing, toileting, dressing, hygiene/grooming, and locomotion level  PARTICIPATION LIMITATIONS: meal prep, cleaning, driving, shopping, and community activity  PERSONAL FACTORS: Past/current experiences are also affecting patient's functional outcome.   REHAB POTENTIAL: Good  CLINICAL DECISION MAKING: Evolving/moderate complexity  EVALUATION COMPLEXITY: Moderate   GOALS: Goals reviewed with patient?  Yes  SHORT TERM GOALS: Target date: 06/04/24 Pt. Will increase B hip flexion to >90 deg. In supine position to improve strength/ step length with gait.   Baseline:  see above Goal status: INITIAL  2.  Pt. Will complete 5xSTS in <10 sec. To improve mobility/ decrease fall risk.   Baseline: 5 times sit to stand: 23.44 sec. (1st attempt), 12.67 sec. (2nd attempt).  Goal status: INITIAL   LONG TERM GOALS: Target date: 07/30/24  Pt. Independent with HEP to increase B LE muscle strength 1/2 muscle grade to improve mobility/ decrease fall risk.   Baseline: see above Goal status: INITIAL  2.  Pt. Will complete Berg balance test and score >48/56 to decrease fall risk/ improve walking with least assistive device.   Baseline:  TBD Goal status: INITIAL  3.  Pt. Will increase LEFS to >30 out of 80 to improve pain-free functional mobility.   Baseline: 18 out of 80 Goal status: INITIAL  4.  Pt. Able to ambulate 15 minutes with use of SPC and consistent step pattern on outside surfaces.   Baseline:  increase B knee pain/ limited endurance Goal status: INITIAL  PLAN:  PT FREQUENCY: 1-2x/week  PT DURATION: 12 weeks  PLANNED INTERVENTIONS: 97750- Physical Performance Testing, 97110-Therapeutic exercises, 97530- Therapeutic activity, [id number]- Neuromuscular re-education, 97535- Self Care, 86578- Manual therapy, [phone]- Gait training, [phone]- Electrical stimulation (unattended), Patient/Family education, Balance training, Stair training, Joint mobilization, Spinal mobilization, Cryotherapy, and Moist heat  PLAN FOR NEXT SESSION: Progress HEP/ walking endurance.     Lendell Quarry, PT, DPT # [phone] 05/15/2024, 12:35 PM

## 2024-05-14 NOTE — Therapy (Signed)
 OUTPATIENT PHYSICAL THERAPY LOWER EXTREMITY EVALUATION  Patient Name: Tom Underwood MRN: [id number] DOB:07/15/1951, 73 y.o., male Today's Date: 05/07/2024  END OF SESSION:  PT End of Session - 05/14/24 0911     Visit Number 1    Number of Visits 24    Date for PT Re-Evaluation 07/30/24    PT Start Time 1419    PT Stop Time 1530    PT Time Calculation (min) 71 min    Equipment Utilized During Treatment --    Activity Tolerance Patient tolerated treatment well    Behavior During Therapy WFL for tasks assessed/performed            Past Medical History:  Diagnosis Date   Anxiety    Arthritis    Atrial fibrillation (HCC)    a.) CHA2DS2-VASc = 4 (age, HTN, CVA x2). b.) s/p 200 J synchronized cardioversion (DCCV) on 12/08/2021. c.) rate/rythum maintained on oral metoprolol  tartrate; chronically anticoagulated using rivaroxaban .   BPH (benign prostatic hyperplasia)    DDD (degenerative disc disease), lumbar    Decreased libido    Depression    Diverticulitis    Diverticulosis    GERD (gastroesophageal reflux disease)    Hepatic steatosis    History of 2019 novel coronavirus disease (COVID-19) 12/28/2020   History of cardiac murmur in childhood    History of kidney stones    HLD (hyperlipidemia)    Hypertension    Hypogonadism in male    OSA on CPAP    Pre-diabetes    Rhinitis, allergic    Stroke (HCC) 07/23/2019   a.) LEFT posterior cor radiata. b.) residual RIGHT sided weakness   Past Surgical History:  Procedure Laterality Date   CARDIOVERSION N/A 12/08/2021   Procedure: CARDIOVERSION;  Surgeon: Sammy Crisp, MD;  Location: [facility] ORS;  Service: Cardiovascular;  Laterality: N/A;   COLONOSCOPY     COLONOSCOPY WITH PROPOFOL  N/A 10/06/2023   Procedure: COLONOSCOPY WITH PROPOFOL ;  Surgeon: Luke Salaam, MD;  Location: [facility] ENDOSCOPY;  Service: Gastroenterology;  Laterality: N/A;   EVALUATION UNDER ANESTHESIA WITH HEMORRHOIDECTOMY N/A 04/15/2022   Procedure: EXAM  UNDER ANESTHESIA WITH HEMORRHOIDECTOMY;  Surgeon: Conrado Delay, DO;  Location: [facility] ORS;  Service: General;  Laterality: N/A;   EXTERNAL EAR SURGERY     EYE SURGERY     FINGER SURGERY Right    lateration to 5 th digit   KNEE SURGERY     left eye     POLYPECTOMY  10/06/2023   Procedure: POLYPECTOMY;  Surgeon: Luke Salaam, MD;  Location: [facility] ENDOSCOPY;  Service: Gastroenterology;;   RECTAL EXAM UNDER ANESTHESIA N/A 04/23/2022   Procedure: RECTAL EXAM UNDER ANESTHESIA WITH LIGATION OF BLEEDING;  Surgeon: Emmalene Hare, MD;  Location: [facility] ORS;  Service: General;  Laterality: N/A;   Patient Active Problem List   Diagnosis Date Noted   History of atrial fibrillation 11/22/2023   Major depression, recurrent, chronic (HCC) 11/22/2023   Adenomatous polyp of colon 10/06/2023   Gastroesophageal reflux disease without esophagitis 05/17/2022   History of syncope 05/17/2022   History of hemorrhoidectomy 05/17/2022   Vitamin D  deficiency 05/17/2022   Lower extremity edema 05/17/2022   History of rectal bleeding 05/17/2022   Morbid obesity (HCC)    Hypertension    Hemiparesis of right dominant side as late effect of cerebral infarction (HCC) 08/13/2019   History of kidney stones 05/02/2018   ED (erectile dysfunction) 05/02/2018   Hyperglycemia 01/30/2017   Arthritis of knee, degenerative 01/30/2017   Benign prostatic  hyperplasia without lower urinary tract symptoms 02/03/2016   Dyslipidemia 08/04/2015   OSA on CPAP 06/30/2015    PCP: Arleen Lacer, MD  REFERRING PROVIDER: Sowles, Krichna, MD  REFERRING DIAG:  [phone] (ICD-10-CM) - Hemiparesis of right dominant side as late effect of cerebral infarction (HCC)  R26.81 (ICD-10-CM) - Gait instability    THERAPY DIAG:  Difficulty in walking, not elsewhere classified  Muscle weakness (generalized)  Abnormality of gait and mobility  Unsteadiness on feet  Joint stiffness of both knees  Rationale for Evaluation and Treatment:  Rehabilitation  ONSET DATE: chronic  SUBJECTIVE:   SUBJECTIVE STATEMENT: Pt. Reports <5/10 B knee pain walking around PT clinic.  Pt. States he has been walking but limited by knee pain/ endurance.  Pt. Referred back to PT by MD since discharge from PT a few months ago due to regression in LE strength/ endurance.  Pt. Had a R sided stroke on 08/23/2019 and lives with wife and uses [facility] for safety with walking.  Pt. Fearful with walking on varying terrain/ curbs.  No recent falls reported.    PERTINENT HISTORY: Pt. Known well to PT clinic.    PAIN:  Are you having pain? Yes: NPRS scale: 4-5/10  Pain location: B knees Pain description: achy/ persistent Aggravating factors: walking/ step ups Relieving factors: rest  PRECAUTIONS: Fall  RED FLAGS: None   WEIGHT BEARING RESTRICTIONS: No  FALLS:  Has patient fallen in last 6 months? No  LIVING ENVIRONMENT: Lives with: lives with their spouse Lives in: Mobile home Stairs: Yes: External: 5 steps; can reach both Has following equipment at home: Single point cane  OCCUPATION: Retired  PLOF: Independent with household mobility with device  PATIENT GOALS: Increase R sided muscle strength/ walking endurance/ balance.    NEXT MD VISIT: PRN  OBJECTIVE:  Note: Objective measures were completed at Evaluation unless otherwise noted.  PATIENT SURVEYS:  LEFS: 18 out of 80.    COGNITION: Overall cognitive status: Within functional limits for tasks assessed     SENSATION: Light touch: Impaired   EDEMA:  Moderate B lower leg swelling.  Pt. Has compression stockings/ lymphedema pumps.  Marked improvement in lower leg swelling as compared to last PT tx. Session.     MUSCLE LENGTH: Hamstrings: NT Thomas test: Unable to test due to limited supine positioning  POSTURE: rounded shoulders, forward head, and flexed trunk   PALPATION: (+) B knee joint line and lower leg tenderness.  Pitting edema noted in B lower legs (slightly improved  from last PT tx. Session several months ago).    LOWER EXTREMITY ROM:  Active ROM Right eval Left eval  Hip flexion <90 deg. <90 deg.  Hip extension    Hip abduction    Hip adduction    Hip internal rotation    Hip external rotation    Knee flexion    Knee extension    Ankle dorsiflexion    Ankle plantarflexion    Ankle inversion    Ankle eversion     (Blank rows = not tested)  LOWER EXTREMITY MMT:  MMT Right eval Left eval  Hip flexion 4- 4  Hip extension    Hip abduction 4 4+  Hip adduction    Hip internal rotation 4+ 4+  Hip external rotation 4 4  Knee flexion 5 5  Knee extension 5 5  Ankle dorsiflexion    Ankle plantarflexion    Ankle inversion    Ankle eversion     (Blank rows =  not tested)  LOWER EXTREMITY SPECIAL TESTS:  NT  FUNCTIONAL TESTS:  5 times sit to stand: 23.44 sec. (1st attempt), 12.67 sec. (2nd attempt).   Berg Balance Scale: TBD  GAIT: Distance walked: in clinic/ outside on sidewalk Assistive device utilized: Single point cane Level of assistance: Modified independence Comments: slow gait pattern with decreased arm swing, heel strike, reciprocal stride length. R foot ER.  No LOB during tx. Session.  Several seated rest breaks (vital WNL)                                                                                                     TREATMENT DATE: 05/07/24  See evaluation  Nustep L5 10 min. B UE/LE (0.51 miles)- consistent cadence.    Walking marching in //-bars with focus on hip flexion/ knee flexion (limited, esp. On R)  Lateral walking in //-bars with cuing to increase upright posture.      PATIENT EDUCATION:  Education details: Discussed walking program/ home ex. Person educated: Patient Education method: Medical illustrator Education comprehension: verbalized understanding and returned demonstration  HOME EXERCISE PROGRAM: Will issue next tx.  ASSESSMENT:  CLINICAL IMPRESSION: Patient is a pleasant 73  y.o. male who was seen today for physical therapy evaluation and treatment for generalized weakness/ gait difficulty/ balance issues.  Pt. Ambulates with use of SPC in L hand with a consistent 2-point gait reciprocal gait pattern.  Limited B arm swing, step length and knee flexion/ heel strike.  Pt. Has difficulty with daily mobility due to generalized deconditioning/ weakness.  Pt. Will benefit from skilled PT services to increase strength/ coordination/ mobility and overall safety with daily activities.    OBJECTIVE IMPAIRMENTS: Abnormal gait, cardiopulmonary status limiting activity, decreased activity tolerance, decreased balance, decreased coordination, decreased endurance, decreased mobility, difficulty walking, decreased ROM, decreased strength, hypomobility, impaired flexibility, impaired sensation, impaired UE functional use, improper body mechanics, postural dysfunction, obesity, and pain.   ACTIVITY LIMITATIONS: carrying, lifting, bending, standing, squatting, stairs, transfers, bed mobility, bathing, toileting, dressing, hygiene/grooming, and locomotion level  PARTICIPATION LIMITATIONS: meal prep, cleaning, driving, shopping, and community activity  PERSONAL FACTORS: Past/current experiences are also affecting patient's functional outcome.   REHAB POTENTIAL: Good  CLINICAL DECISION MAKING: Evolving/moderate complexity  EVALUATION COMPLEXITY: Moderate   GOALS: Goals reviewed with patient? Yes  SHORT TERM GOALS: Target date: 06/04/24 Pt. Will increase B hip flexion to >90 deg. In supine position to improve strength/ step length with gait.   Baseline:  see above Goal status: INITIAL  2.  Pt. Will complete 5xSTS in <10 sec. To improve mobility/ decrease fall risk.   Baseline: 5 times sit to stand: 23.44 sec. (1st attempt), 12.67 sec. (2nd attempt).  Goal status: INITIAL   LONG TERM GOALS: Target date: 07/30/24  Pt. Independent with HEP to increase B LE muscle strength 1/2 muscle  grade to improve mobility/ decrease fall risk.   Baseline: see above Goal status: INITIAL  2.  Pt. Will complete Berg balance test and score >48/56 to decrease fall risk/ improve walking with least assistive device.   Baseline:  TBD Goal status: INITIAL  3.  Pt. Will increase LEFS to >30 out of 80 to improve pain-free functional mobility.   Baseline: 18 out of 80 Goal status: INITIAL  4.  Pt. Able to ambulate 15 minutes with use of SPC and consistent step pattern on outside surfaces.   Baseline:  increase B knee pain/ limited endurance Goal status: INITIAL  PLAN:  PT FREQUENCY: 1-2x/week  PT DURATION: 12 weeks  PLANNED INTERVENTIONS: 97750- Physical Performance Testing, 97110-Therapeutic exercises, 97530- Therapeutic activity, [id number]- Neuromuscular re-education, 97535- Self Care, [id number]- Manual therapy, [phone]- Gait training, [phone]- Electrical stimulation (unattended), Patient/Family education, Balance training, Stair training, Joint mobilization, Spinal mobilization, Cryotherapy, and Moist heat  PLAN FOR NEXT SESSION: Progress HEP/ walking endurance.     Lendell Quarry, PT, DPT # [id number] 05/14/2024, 9:17 AM

## 2024-05-15 ENCOUNTER — Other Ambulatory Visit: Payer: Self-pay | Admitting: Family Medicine

## 2024-05-15 DIAGNOSIS — N400 Enlarged prostate without lower urinary tract symptoms: Secondary | ICD-10-CM

## 2024-05-15 DIAGNOSIS — F339 Major depressive disorder, recurrent, unspecified: Secondary | ICD-10-CM

## 2024-05-15 DIAGNOSIS — K219 Gastro-esophageal reflux disease without esophagitis: Secondary | ICD-10-CM

## 2024-05-16 ENCOUNTER — Encounter: Payer: Self-pay | Admitting: Physical Therapy

## 2024-05-16 ENCOUNTER — Ambulatory Visit: Admitting: Physical Therapy

## 2024-05-16 DIAGNOSIS — R269 Unspecified abnormalities of gait and mobility: Secondary | ICD-10-CM

## 2024-05-16 DIAGNOSIS — M25661 Stiffness of right knee, not elsewhere classified: Secondary | ICD-10-CM

## 2024-05-16 DIAGNOSIS — R279 Unspecified lack of coordination: Secondary | ICD-10-CM

## 2024-05-16 DIAGNOSIS — M6281 Muscle weakness (generalized): Secondary | ICD-10-CM | POA: Diagnosis not present

## 2024-05-16 DIAGNOSIS — R262 Difficulty in walking, not elsewhere classified: Secondary | ICD-10-CM | POA: Diagnosis not present

## 2024-05-16 DIAGNOSIS — M25662 Stiffness of left knee, not elsewhere classified: Secondary | ICD-10-CM | POA: Diagnosis not present

## 2024-05-16 DIAGNOSIS — R2681 Unsteadiness on feet: Secondary | ICD-10-CM | POA: Diagnosis not present

## 2024-05-16 NOTE — Therapy (Signed)
 OUTPATIENT PHYSICAL THERAPY LOWER EXTREMITY TREATMENT  Patient Name: Tom Underwood MRN: [id number] DOB:[birth year], 73 y.o., male Today's Date: 05/17/2024  END OF SESSION:  PT End of Session - 05/16/24 1431     Visit Number 3    Number of Visits 24    Date for PT Re-Evaluation 07/30/24    PT Start Time 1431    PT Stop Time 1532    PT Time Calculation (min) 61 min    Activity Tolerance Patient tolerated treatment well    Behavior During Therapy WFL for tasks assessed/performed         Past Medical History:  Diagnosis Date   Anxiety    Arthritis    Atrial fibrillation (HCC)    a.) CHA2DS2-VASc = 4 (age, HTN, CVA x2). b.) s/p 200 J synchronized cardioversion (DCCV) on 12/08/2021. c.) rate/rythum maintained on oral metoprolol  tartrate; chronically anticoagulated using rivaroxaban .   BPH (benign prostatic hyperplasia)    DDD (degenerative disc disease), lumbar    Decreased libido    Depression    Diverticulitis    Diverticulosis    GERD (gastroesophageal reflux disease)    Hepatic steatosis    History of 2019 novel coronavirus disease (COVID-19) 12/28/2020   History of cardiac murmur in childhood    History of kidney stones    HLD (hyperlipidemia)    Hypertension    Hypogonadism in male    OSA on CPAP    Pre-diabetes    Rhinitis, allergic    Stroke (HCC) 07/23/2019   a.) LEFT posterior cor radiata. b.) residual RIGHT sided weakness   Past Surgical History:  Procedure Laterality Date   CARDIOVERSION N/A 12/08/2021   Procedure: CARDIOVERSION;  Surgeon: Sammy Crisp, MD;  Location: [facility] ORS;  Service: Cardiovascular;  Laterality: N/A;   COLONOSCOPY     COLONOSCOPY WITH PROPOFOL  N/A 10/06/2023   Procedure: COLONOSCOPY WITH PROPOFOL ;  Surgeon: Luke Salaam, MD;  Location: [facility] END[facility]COPY;  Service: Gastroenterology;  Laterality: N/A;   EVALUATION UNDER ANESTHESIA WITH HEMORRHOIDECTOMY N/A 04/15/2022   Procedure: EXAM UNDER ANESTHESIA WITH HEMORRHOIDECTOMY;  Surgeon:  Conrado Delay, DO;  Location: [facility] ORS;  Service: General;  Laterality: N/A;   EXTERNAL EAR SURGERY     EYE SURGERY     FINGER SURGERY Right    lateration to 5 th digit   KNEE SURGERY     left eye     POLYPECTOMY  10/06/2023   Procedure: POLYPECTOMY;  Surgeon: Luke Salaam, MD;  Location: [facility];  Service: Gastroenterology;;   RECTAL EXAM UNDER ANESTHESIA N/A 04/23/2022   Procedure: RECTAL EXAM UNDER ANESTHESIA WITH LIGATION OF BLEEDING;  Surgeon: Emmalene Hare, MD;  Location: [facility] ORS;  Service: General;  Laterality: N/A;   Patient Active Problem List   Diagnosis Date Noted   History of atrial fibrillation 11/22/2023   Major depression, recurrent, chronic (HCC) 11/22/2023   Adenomatous polyp of colon 10/06/2023   Gastroesophageal reflux disease without esophagitis 05/17/2022   History of syncope 05/17/2022   History of hemorrhoidectomy 05/17/2022   Vitamin D  deficiency 05/17/2022   Lower extremity edema 05/17/2022   History of rectal bleeding 05/17/2022   Morbid obesity (HCC)    Hypertension    Hemiparesis of right dominant side as late effect of cerebral infarction (HCC) 08/13/2019   History of kidney stones 05/02/2018   ED (erectile dysfunction) 05/02/2018   Hyperglycemia 01/30/2017   Arthritis of knee, degenerative 01/30/2017   Benign prostatic hyperplasia without lower urinary tract symptoms 02/03/2016   Dyslipidemia 08/04/2015  OSA on CPAP 06/30/2015    PCP: Arleen Lacer, MD  REFERRING PROVIDER: Arleen Lacer, MD  REFERRING DIAG:  [phone] (ICD-10-CM) - Hemiparesis of right dominant side as late effect of cerebral infarction (HCC)  R26.81 (ICD-10-CM) - Gait instability    THERAPY DIAG:  Difficulty in walking, not elsewhere classified  Muscle weakness (generalized)  Abnormality of gait and mobility  Unsteadiness on feet  Joint stiffness of both knees  Unspecified lack of coordination  Rationale for Evaluation and Treatment:  Rehabilitation  ONSET DATE: chronic  SUBJECTIVE:   SUBJECTIVE STATEMENT: Pt. Reports <5/10 B knee pain walking around PT clinic.  Pt. States he has been walking but limited by knee pain/ endurance.  Pt. Referred back to PT by MD since discharge from PT a few months ago due to regression in LE strength/ endurance.  Pt. Had a R sided stroke on 08/23/2019 and lives with wife and uses [facility] for safety with walking.  Pt. Fearful with walking on varying terrain/ curbs.  No recent falls reported.    PERTINENT HISTORY: Pt. Known well to PT clinic.    PAIN:  Are you having pain? Yes: NPRS scale: 4-5/10  Pain location: B knees Pain description: achy/ persistent Aggravating factors: walking/ step ups Relieving factors: rest  PRECAUTIONS: Fall  RED FLAGS: None   WEIGHT BEARING RESTRICTIONS: No  FALLS:  Has patient fallen in last 6 months? No  LIVING ENVIRONMENT: Lives with: lives with their spouse Lives in: Mobile home Stairs: Yes: External: 5 steps; can reach both Has following equipment at home: Single point cane  OCCUPATION: Retired  PLOF: Independent with household mobility with device  PATIENT GOALS: Increase R sided muscle strength/ walking endurance/ balance.    NEXT MD VISIT: PRN  OBJECTIVE:  Note: Objective measures were completed at Evaluation unless otherwise noted.  PATIENT SURVEYS:  LEFS: 18 out of 80.    COGNITION: Overall cognitive status: Within functional limits for tasks assessed     SENSATION: Light touch: Impaired   EDEMA:  Moderate B lower leg swelling.  Pt. Has compression stockings/ lymphedema pumps.  Marked improvement in lower leg swelling as compared to last PT tx. Session.     MUSCLE LENGTH: Hamstrings: NT Thomas test: Unable to test due to limited supine positioning  POSTURE: rounded shoulders, forward head, and flexed trunk   PALPATION: (+) B knee joint line and lower leg tenderness.  Pitting edema noted in B lower legs (slightly improved  from last PT tx. Session several months ago).    LOWER EXTREMITY ROM:  Active ROM Right eval Left eval  Hip flexion <90 deg. <90 deg.  Hip extension    Hip abduction    Hip adduction    Hip internal rotation    Hip external rotation    Knee flexion    Knee extension    Ankle dorsiflexion    Ankle plantarflexion    Ankle inversion    Ankle eversion     (Blank rows = not tested)  LOWER EXTREMITY MMT:  MMT Right eval Left eval  Hip flexion 4- 4  Hip extension    Hip abduction 4 4+  Hip adduction    Hip internal rotation 4+ 4+  Hip external rotation 4 4  Knee flexion 5 5  Knee extension 5 5  Ankle dorsiflexion    Ankle plantarflexion    Ankle inversion    Ankle eversion     (Blank rows = not tested)  LOWER EXTREMITY SPECIAL TESTS:  NT  FUNCTIONAL TESTS:  5 times sit to stand: 23.44 sec. (1st attempt), 12.67 sec. (2nd attempt).   Berg Balance Scale: TBD  GAIT: Distance walked: in clinic/ outside on sidewalk Assistive device utilized: Single point cane Level of assistance: Modified independence Comments: slow gait pattern with decreased arm swing, heel strike, reciprocal stride length. R foot ER.  No LOB during tx. Session.  Several seated rest breaks (vital WNL)                                                                                                     TREATMENT DATE: 05/17/2024  Subjective:  Pt. Arrived to PT with use of SPC and continued complaints of B knee discomfort.  Pt. Reports no falls or LOB.  PT talked with pt./wife about pts. Daily activity/ importance of walking every hour around house.      There.ex.:  No charge Nustep L6 10 min. B UE/LE (0.53 miles)- consistent cadence.    There.act.:  5# ankle wts.: walking in //-bars forward/backwards 3 laps each.  Mirror feedback for posture correction.  Good step length with no UE assist needed.      Seated LAQ 20x with 5# ankle wts.    Lateral walking in //-bars with cuing to increase upright  posture (3 laps).  HR: 70 bpm/ O2 sat. 93%  STS from gray chair with added Airex pad 6x with no UE assist (fatigue/ rest break required).  TRX squats: 22x with cuing to slow down/ maintain upright posture.    Walking in hallway/ outside of car with use of SPC and posture correction.    Discussed HEP   PATIENT EDUCATION:  Education details: Discussed walking program/ home ex. Person educated: Patient Education method: Medical illustrator Education comprehension: verbalized understanding and returned demonstration  HOME EXERCISE PROGRAM: Will issue next tx.  ASSESSMENT:  CLINICAL IMPRESSION: Pt. Ambulates with use of SPC in L hand with a consistent 2-point gait reciprocal gait pattern.  Pt. Working on increasing hip flexion/ step overs in //-bars with no UE assist.  Limited B arm swing, step length and knee flexion/ heel strike while walking without assistive device.  Pt. Has difficulty with daily mobility due to generalized deconditioning/ weakness.  PT really encouraged pt./wife the importance of walking every hour.   Pt. Will benefit from skilled PT services to increase strength/ coordination/ mobility and overall safety with daily activities.    OBJECTIVE IMPAIRMENTS: Abnormal gait, cardiopulmonary status limiting activity, decreased activity tolerance, decreased balance, decreased coordination, decreased endurance, decreased mobility, difficulty walking, decreased ROM, decreased strength, hypomobility, impaired flexibility, impaired sensation, impaired UE functional use, improper body mechanics, postural dysfunction, obesity, and pain.   ACTIVITY LIMITATIONS: carrying, lifting, bending, standing, squatting, stairs, transfers, bed mobility, bathing, toileting, dressing, hygiene/grooming, and locomotion level  PARTICIPATION LIMITATIONS: meal prep, cleaning, driving, shopping, and community activity  PERSONAL FACTORS: Past/current experiences are also affecting patient's  functional outcome.   REHAB POTENTIAL: Good  CLINICAL DECISION MAKING: Evolving/moderate complexity  EVALUATION COMPLEXITY: Moderate   GOALS: Goals reviewed with patient? Yes  SHORT TERM GOALS: Target  date: 06/04/24 Pt. Will increase B hip flexion to >90 deg. In supine position to improve strength/ step length with gait.   Baseline:  see above Goal status: INITIAL  2.  Pt. Will complete 5xSTS in <10 sec. To improve mobility/ decrease fall risk.   Baseline: 5 times sit to stand: 23.44 sec. (1st attempt), 12.67 sec. (2nd attempt).  Goal status: INITIAL   LONG TERM GOALS: Target date: 07/30/24  Pt. Independent with HEP to increase B LE muscle strength 1/2 muscle grade to improve mobility/ decrease fall risk.   Baseline: see above Goal status: INITIAL  2.  Pt. Will complete Berg balance test and score >48/56 to decrease fall risk/ improve walking with least assistive device.   Baseline:  TBD Goal status: INITIAL  3.  Pt. Will increase LEFS to >30 out of 80 to improve pain-free functional mobility.   Baseline: 18 out of 80 Goal status: INITIAL  4.  Pt. Able to ambulate 15 minutes with use of SPC and consistent step pattern on outside surfaces.   Baseline:  increase B knee pain/ limited endurance Goal status: INITIAL  PLAN:  PT FREQUENCY: 1-2x/week  PT DURATION: 12 weeks  PLANNED INTERVENTIONS: 97750- Physical Performance Testing, 97110-Therapeutic exercises, 97530- Therapeutic activity, [id number]- Neuromuscular re-education, 97535- Self Care, 16109- Manual therapy, [phone]- Gait training, [phone]- Electrical stimulation (unattended), Patient/Family education, Balance training, Stair training, Joint mobilization, Spinal mobilization, Cryotherapy, and Moist heat  PLAN FOR NEXT SESSION: Progress HEP/ walking endurance.     Lendell Quarry, PT, DPT # [phone] 05/17/2024, 1:24 PM

## 2024-05-21 ENCOUNTER — Encounter: Payer: Self-pay | Admitting: Physical Therapy

## 2024-05-21 ENCOUNTER — Ambulatory Visit: Admitting: Physical Therapy

## 2024-05-21 DIAGNOSIS — R269 Unspecified abnormalities of gait and mobility: Secondary | ICD-10-CM

## 2024-05-21 DIAGNOSIS — R279 Unspecified lack of coordination: Secondary | ICD-10-CM

## 2024-05-21 DIAGNOSIS — R262 Difficulty in walking, not elsewhere classified: Secondary | ICD-10-CM | POA: Diagnosis not present

## 2024-05-21 DIAGNOSIS — M25662 Stiffness of left knee, not elsewhere classified: Secondary | ICD-10-CM | POA: Diagnosis not present

## 2024-05-21 DIAGNOSIS — R2681 Unsteadiness on feet: Secondary | ICD-10-CM | POA: Diagnosis not present

## 2024-05-21 DIAGNOSIS — M6281 Muscle weakness (generalized): Secondary | ICD-10-CM | POA: Diagnosis not present

## 2024-05-21 DIAGNOSIS — M25661 Stiffness of right knee, not elsewhere classified: Secondary | ICD-10-CM | POA: Diagnosis not present

## 2024-05-21 NOTE — Therapy (Signed)
 OUTPATIENT PHYSICAL THERAPY LOWER EXTREMITY TREATMENT  Patient Name: Tom Underwood MRN: [id number] DOB:31-Jul-1951, 73 y.o., male Today's Date: 05/21/2024  END OF SESSION:  PT End of Session - 05/21/24 1434     Visit Number 4    Number of Visits 24    Date for PT Re-Evaluation 07/30/24    PT Start Time 1434    Activity Tolerance Patient tolerated treatment well    Behavior During Therapy [facility] for tasks assessed/performed         Past Medical History:  Diagnosis Date   Anxiety    Arthritis    Atrial fibrillation (HCC)    a.) CHA2DS2-VASc = 4 (age, HTN, CVA x2). b.) s/p 200 J synchronized cardioversion (DCCV) on 12/08/2021. c.) rate/rythum maintained on oral metoprolol  tartrate; chronically anticoagulated using rivaroxaban .   BPH (benign prostatic hyperplasia)    DDD (degenerative disc disease), lumbar    Decreased libido    Depression    Diverticulitis    Diverticulosis    GERD (gastroesophageal reflux disease)    Hepatic steatosis    History of 2019 novel coronavirus disease (COVID-19) 12/28/2020   History of cardiac murmur in childhood    History of kidney stones    HLD (hyperlipidemia)    Hypertension    Hypogonadism in male    OSA on CPAP    Pre-diabetes    Rhinitis, allergic    Stroke (HCC) 07/23/2019   a.) LEFT posterior cor radiata. b.) residual RIGHT sided weakness   Past Surgical History:  Procedure Laterality Date   CARDIOVERSION N/A 12/08/2021   Procedure: CARDIOVERSION;  Surgeon: Sammy Crisp, MD;  Location: [facility] ORS;  Service: Cardiovascular;  Laterality: N/A;   COLONOSCOPY     COLONOSCOPY WITH PROPOFOL  N/A 10/06/2023   Procedure: COLONOSCOPY WITH PROPOFOL ;  Surgeon: Luke Salaam, MD;  Location: [facility] ENDOSCOPY;  Service: Gastroenterology;  Laterality: N/A;   EVALUATION UNDER ANESTHESIA WITH HEMORRHOIDECTOMY N/A 04/15/2022   Procedure: EXAM UNDER ANESTHESIA WITH HEMORRHOIDECTOMY;  Surgeon: Conrado Delay, DO;  Location: [facility] ORS;  Service: General;   Laterality: N/A;   EXTERNAL EAR SURGERY     EYE SURGERY     FINGER SURGERY Right    lateration to 5 th digit   KNEE SURGERY     left eye     POLYPECTOMY  10/06/2023   Procedure: POLYPECTOMY;  Surgeon: Luke Salaam, MD;  Location: [facility] ENDOSCOPY;  Service: Gastroenterology;;   RECTAL EXAM UNDER ANESTHESIA N/A 04/23/2022   Procedure: RECTAL EXAM UNDER ANESTHESIA WITH LIGATION OF BLEEDING;  Surgeon: Emmalene Hare, MD;  Location: [facility] ORS;  Service: General;  Laterality: N/A;   Patient Active Problem List   Diagnosis Date Noted   History of atrial fibrillation 11/22/2023   Major depression, recurrent, chronic (HCC) 11/22/2023   Adenomatous polyp of colon 10/06/2023   Gastroesophageal reflux disease without esophagitis 05/17/2022   History of syncope 05/17/2022   History of hemorrhoidectomy 05/17/2022   Vitamin D  deficiency 05/17/2022   Lower extremity edema 05/17/2022   History of rectal bleeding 05/17/2022   Morbid obesity (HCC)    Hypertension    Hemiparesis of right dominant side as late effect of cerebral infarction (HCC) 08/13/2019   History of kidney stones 05/02/2018   ED (erectile dysfunction) 05/02/2018   Hyperglycemia 01/30/2017   Arthritis of knee, degenerative 01/30/2017   Benign prostatic hyperplasia without lower urinary tract symptoms 02/03/2016   Dyslipidemia 08/04/2015   OSA on CPAP 06/30/2015    PCP: Sowles, Krichna, MD  REFERRING PROVIDER:  Sowles, Krichna, MD  REFERRING DIAG:  [phone] (ICD-10-CM) - Hemiparesis of right dominant side as late effect of cerebral infarction (HCC)  R26.81 (ICD-10-CM) - Gait instability    THERAPY DIAG:  Difficulty in walking, not elsewhere classified  Muscle weakness (generalized)  Abnormality of gait and mobility  Unsteadiness on feet  Joint stiffness of both knees  Unspecified lack of coordination  Rationale for Evaluation and Treatment: Rehabilitation  ONSET DATE: chronic  SUBJECTIVE:   SUBJECTIVE  STATEMENT: Pt. Reports <5/10 B knee pain walking around PT clinic.  Pt. States he has been walking but limited by knee pain/ endurance.  Pt. Referred back to PT by MD since discharge from PT a few months ago due to regression in LE strength/ endurance.  Pt. Had a R sided stroke on 08/23/2019 and lives with wife and uses [facility] for safety with walking.  Pt. Fearful with walking on varying terrain/ curbs.  No recent falls reported.    PERTINENT HISTORY: Pt. Known well to PT clinic.    PAIN:  Are you having pain? Yes: NPRS scale: 4-5/10  Pain location: B knees Pain description: achy/ persistent Aggravating factors: walking/ step ups Relieving factors: rest  PRECAUTIONS: Fall  RED FLAGS: None   WEIGHT BEARING RESTRICTIONS: No  FALLS:  Has patient fallen in last 6 months? No  LIVING ENVIRONMENT: Lives with: lives with their spouse Lives in: Mobile home Stairs: Yes: External: 5 steps; can reach both Has following equipment at home: Single point cane  OCCUPATION: Retired  PLOF: Independent with household mobility with device  PATIENT GOALS: Increase R sided muscle strength/ walking endurance/ balance.    NEXT MD VISIT: PRN  OBJECTIVE:  Note: Objective measures were completed at Evaluation unless otherwise noted.  PATIENT SURVEYS:  LEFS: 18 out of 80.    COGNITION: Overall cognitive status: Within functional limits for tasks assessed     SENSATION: Light touch: Impaired   EDEMA:  Moderate B lower leg swelling.  Pt. Has compression stockings/ lymphedema pumps.  Marked improvement in lower leg swelling as compared to last PT tx. Session.     MUSCLE LENGTH: Hamstrings: NT Thomas test: Unable to test due to limited supine positioning  POSTURE: rounded shoulders, forward head, and flexed trunk   PALPATION: (+) B knee joint line and lower leg tenderness.  Pitting edema noted in B lower legs (slightly improved from last PT tx. Session several months ago).    LOWER EXTREMITY  ROM:  Active ROM Right eval Left eval  Hip flexion <90 deg. <90 deg.  Hip extension    Hip abduction    Hip adduction    Hip internal rotation    Hip external rotation    Knee flexion    Knee extension    Ankle dorsiflexion    Ankle plantarflexion    Ankle inversion    Ankle eversion     (Blank rows = not tested)  LOWER EXTREMITY MMT:  MMT Right eval Left eval  Hip flexion 4- 4  Hip extension    Hip abduction 4 4+  Hip adduction    Hip internal rotation 4+ 4+  Hip external rotation 4 4  Knee flexion 5 5  Knee extension 5 5  Ankle dorsiflexion    Ankle plantarflexion    Ankle inversion    Ankle eversion     (Blank rows = not tested)  LOWER EXTREMITY SPECIAL TESTS:  NT  FUNCTIONAL TESTS:  5 times sit to stand: 23.44 sec. (1st attempt),  12.67 sec. (2nd attempt).   Berg Balance Scale: TBD  GAIT: Distance walked: in clinic/ outside on sidewalk Assistive device utilized: Single point cane Level of assistance: Modified independence Comments: slow gait pattern with decreased arm swing, heel strike, reciprocal stride length. R foot ER.  No LOB during tx. Session.  Several seated rest breaks (vital WNL)                                                     TREATMENT DATE: 05/21/2024  Subjective:  Pt. Arrived to PT with use of SPC and continued complaints of B knee discomfort.  Pt. Reports no falls or LOB.  PT talked with pt./wife about pts. Daily activity/ importance of walking every hour around house.      There.ex.:  No charge Nustep L6 10 min. B UE/LE (0.53 miles)- consistent cadence.  HR 75 bpm.  O2 sat.: 94%.    There.act.:  Ascending/descending stairs with recip. pattern  Walking with alt. UE/LE touches in //-bars Engineer, structural).    Lateral walking in //-bars with cuing to increase upright posture (3 laps).  HR: 70 bpm/ O2 sat. 93%  STS from gray chair with added Airex pad 6x with no UE assist (fatigue/ rest break required).  TRX squats: 22x with cuing  to slow down/ maintain upright posture.    Walking in hallway/ outside of car with use of SPC and posture correction.    Discussed HEP   NOT TODAY: 5# ankle wts.: walking in //-bars forward/backwards 3 laps each.  Mirror feedback for posture correction.  Good step length with no UE assist needed.      Seated LAQ 20x with 5# ankle wts.     PATIENT EDUCATION:  Education details: Discussed walking program/ home ex. Person educated: Patient Education method: Medical illustrator Education comprehension: verbalized understanding and returned demonstration  HOME EXERCISE PROGRAM: Will issue next tx.  ASSESSMENT:  CLINICAL IMPRESSION: Pt. Ambulates with use of SPC in L hand with a consistent 2-point gait reciprocal gait pattern.  Pt. Working on increasing hip flexion/ step overs in //-bars with no UE assist.  Limited B arm swing, step length and knee flexion/ heel strike while walking without assistive device.  Pt. Has difficulty with daily mobility due to generalized deconditioning/ weakness.  PT really encouraged pt./wife the importance of walking every hour.   Pt. Will benefit from skilled PT services to increase strength/ coordination/ mobility and overall safety with daily activities.    OBJECTIVE IMPAIRMENTS: Abnormal gait, cardiopulmonary status limiting activity, decreased activity tolerance, decreased balance, decreased coordination, decreased endurance, decreased mobility, difficulty walking, decreased ROM, decreased strength, hypomobility, impaired flexibility, impaired sensation, impaired UE functional use, improper body mechanics, postural dysfunction, obesity, and pain.   ACTIVITY LIMITATIONS: carrying, lifting, bending, standing, squatting, stairs, transfers, bed mobility, bathing, toileting, dressing, hygiene/grooming, and locomotion level  PARTICIPATION LIMITATIONS: meal prep, cleaning, driving, shopping, and community activity  PERSONAL FACTORS: Past/current  experiences are also affecting patient's functional outcome.   REHAB POTENTIAL: Good  CLINICAL DECISION MAKING: Evolving/moderate complexity  EVALUATION COMPLEXITY: Moderate   GOALS: Goals reviewed with patient? Yes  SHORT TERM GOALS: Target date: 06/04/24 Pt. Will increase B hip flexion to >90 deg. In supine position to improve strength/ step length with gait.   Baseline:  see above Goal status: INITIAL  2.  Pt. Will complete 5xSTS in <10 sec. To improve mobility/ decrease fall risk.   Baseline: 5 times sit to stand: 23.44 sec. (1st attempt), 12.67 sec. (2nd attempt).  Goal status: INITIAL   LONG TERM GOALS: Target date: 07/30/24  Pt. Independent with HEP to increase B LE muscle strength 1/2 muscle grade to improve mobility/ decrease fall risk.   Baseline: see above Goal status: INITIAL  2.  Pt. Will complete Berg balance test and score >48/56 to decrease fall risk/ improve walking with least assistive device.   Baseline:  TBD Goal status: INITIAL  3.  Pt. Will increase LEFS to >30 out of 80 to improve pain-free functional mobility.   Baseline: 18 out of 80 Goal status: INITIAL  4.  Pt. Able to ambulate 15 minutes with use of SPC and consistent step pattern on outside surfaces.   Baseline:  increase B knee pain/ limited endurance Goal status: INITIAL  PLAN:  PT FREQUENCY: 1-2x/week  PT DURATION: 12 weeks  PLANNED INTERVENTIONS: 97750- Physical Performance Testing, 97110-Therapeutic exercises, 97530- Therapeutic activity, [id number]- Neuromuscular re-education, 97535- Self Care, 16109- Manual therapy, [id number]- Gait training, [phone]- Electrical stimulation (unattended), Patient/Family education, Balance training, Stair training, Joint mobilization, Spinal mobilization, Cryotherapy, and Moist heat  PLAN FOR NEXT SESSION: Progress HEP/ walking endurance.     Lendell Quarry, PT, DPT # [phone] 05/21/2024, 2:35 PM

## 2024-05-22 ENCOUNTER — Encounter: Payer: Self-pay | Admitting: Professional

## 2024-05-22 ENCOUNTER — Ambulatory Visit (INDEPENDENT_AMBULATORY_CARE_PROVIDER_SITE_OTHER): Payer: PRIVATE HEALTH INSURANCE | Admitting: Professional

## 2024-05-22 DIAGNOSIS — Z634 Disappearance and death of family member: Secondary | ICD-10-CM

## 2024-05-22 DIAGNOSIS — F411 Generalized anxiety disorder: Secondary | ICD-10-CM

## 2024-05-22 DIAGNOSIS — F320 Major depressive disorder, single episode, mild: Secondary | ICD-10-CM
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
## 2024-05-23 ENCOUNTER — Encounter: Payer: Self-pay | Admitting: Family Medicine

## 2024-05-23 ENCOUNTER — Ambulatory Visit: Admitting: Physical Therapy

## 2024-05-23 ENCOUNTER — Ambulatory Visit: Payer: Self-pay | Admitting: Family Medicine

## 2024-05-23 VITALS — BP 130/74 | HR 54 | Resp 16 | Ht 70.0 in | Wt 304.3 lb

## 2024-05-23 DIAGNOSIS — Z8679 Personal history of other diseases of the circulatory system: Secondary | ICD-10-CM

## 2024-05-23 DIAGNOSIS — M25662 Stiffness of left knee, not elsewhere classified: Secondary | ICD-10-CM | POA: Diagnosis not present

## 2024-05-23 DIAGNOSIS — F339 Major depressive disorder, recurrent, unspecified: Secondary | ICD-10-CM

## 2024-05-23 DIAGNOSIS — K219 Gastro-esophageal reflux disease without esophagitis: Secondary | ICD-10-CM

## 2024-05-23 DIAGNOSIS — I872 Venous insufficiency (chronic) (peripheral): Secondary | ICD-10-CM | POA: Diagnosis not present

## 2024-05-23 DIAGNOSIS — G4733 Obstructive sleep apnea (adult) (pediatric): Secondary | ICD-10-CM | POA: Diagnosis not present

## 2024-05-23 DIAGNOSIS — N138 Other obstructive and reflux uropathy: Secondary | ICD-10-CM | POA: Diagnosis not present

## 2024-05-23 DIAGNOSIS — R269 Unspecified abnormalities of gait and mobility: Secondary | ICD-10-CM | POA: Diagnosis not present

## 2024-05-23 DIAGNOSIS — M25661 Stiffness of right knee, not elsewhere classified: Secondary | ICD-10-CM | POA: Diagnosis not present

## 2024-05-23 DIAGNOSIS — M170 Bilateral primary osteoarthritis of knee: Secondary | ICD-10-CM

## 2024-05-23 DIAGNOSIS — M6281 Muscle weakness (generalized): Secondary | ICD-10-CM

## 2024-05-23 DIAGNOSIS — R600 Localized edema: Secondary | ICD-10-CM | POA: Diagnosis not present

## 2024-05-23 DIAGNOSIS — I69351 Hemiplegia and hemiparesis following cerebral infarction affecting right dominant side: Secondary | ICD-10-CM

## 2024-05-23 DIAGNOSIS — E785 Hyperlipidemia, unspecified: Secondary | ICD-10-CM | POA: Diagnosis not present

## 2024-05-23 DIAGNOSIS — R262 Difficulty in walking, not elsewhere classified: Secondary | ICD-10-CM

## 2024-05-23 DIAGNOSIS — N401 Enlarged prostate with lower urinary tract symptoms: Secondary | ICD-10-CM

## 2024-05-23 DIAGNOSIS — R2689 Other abnormalities of gait and mobility: Secondary | ICD-10-CM

## 2024-05-23 DIAGNOSIS — E559 Vitamin D deficiency, unspecified: Secondary | ICD-10-CM

## 2024-05-23 DIAGNOSIS — R2681 Unsteadiness on feet: Secondary | ICD-10-CM

## 2024-05-23 DIAGNOSIS — R279 Unspecified lack of coordination: Secondary | ICD-10-CM

## 2024-05-23 MED ORDER — TAMSULOSIN HCL 0.4 MG PO CAPS: 0.4000 mg | ORAL_CAPSULE | Freq: Every day | ORAL | 1 refills | Status: DC

## 2024-05-23 MED ORDER — BUPROPION HCL ER (XL) 300 MG PO TB24: 300.0000 mg | ORAL_TABLET | Freq: Every morning | ORAL | 1 refills | Status: DC

## 2024-05-23 MED ORDER — BUSPIRONE HCL 10 MG PO TABS
10.0000 mg | ORAL_TABLET | Freq: Two times a day (BID) | ORAL | 1 refills | Status: AC
Start: 2024-05-23 — End: ?

## 2024-05-23 MED ORDER — PANTOPRAZOLE SODIUM 20 MG PO TBEC
20.0000 mg | DELAYED_RELEASE_TABLET | Freq: Every day | ORAL | 1 refills | Status: DC
Start: 2024-05-23 — End: 2024-10-14

## 2024-05-23 NOTE — Therapy (Addendum)
 OUTPATIENT PHYSICAL THERAPY LOWER EXTREMITY TREATMENT  Patient Name: Tom Underwood MRN: [id number] DOB:[birth year], 73 y.o., male Today's Date: 05/23/2024  END OF SESSION:  PT End of Session - 05/23/24 1427     Visit Number 5    Number of Visits 24    Date for PT Re-Evaluation 07/30/24    PT Start Time 1427    PT Stop Time 1518    PT Time Calculation (min) 51 min    Activity Tolerance Patient tolerated treatment well    Behavior During Therapy WFL for tasks assessed/performed         Past Medical History:  Diagnosis Date   Anxiety    Arthritis    Atrial fibrillation (HCC)    a.) CHA2DS2-VASc = 4 (age, HTN, CVA x2). b.) s/p 200 J synchronized cardioversion (DCCV) on 12/08/2021. c.) rate/rythum maintained on oral metoprolol  tartrate; chronically anticoagulated using rivaroxaban .   BPH (benign prostatic hyperplasia)    DDD (degenerative disc disease), lumbar    Decreased libido    Depression    Diverticulitis    Diverticulosis    GERD (gastroesophageal reflux disease)    Hepatic steatosis    History of 2019 novel coronavirus disease (COVID-19) 12/28/2020   History of cardiac murmur in childhood    History of kidney stones    HLD (hyperlipidemia)    Hypertension    Hypogonadism in male    OSA on CPAP    Pre-diabetes    Rhinitis, allergic    Stroke (HCC) 07/23/2019   a.) LEFT posterior cor radiata. b.) residual RIGHT sided weakness   Past Surgical History:  Procedure Laterality Date   CARDIOVERSION N/A 12/08/2021   Procedure: CARDIOVERSION;  Surgeon: Sammy Crisp, MD;  Location: [facility] ORS;  Service: Cardiovascular;  Laterality: N/A;   COLONOSCOPY     COLONOSCOPY WITH PROPOFOL  N/A 10/06/2023   Procedure: COLONOSCOPY WITH PROPOFOL ;  Surgeon: Luke Salaam, MD;  Location: [facility] ENDOSCOPY;  Service: Gastroenterology;  Laterality: N/A;   EVALUATION UNDER ANESTHESIA WITH HEMORRHOIDECTOMY N/A 04/15/2022   Procedure: EXAM UNDER ANESTHESIA WITH HEMORRHOIDECTOMY;  Surgeon:  Conrado Delay, DO;  Location: [facility] ORS;  Service: General;  Laterality: N/A;   EXTERNAL EAR SURGERY     EYE SURGERY     FINGER SURGERY Right    lateration to 5 th digit   KNEE SURGERY     left eye     POLYPECTOMY  10/06/2023   Procedure: POLYPECTOMY;  Surgeon: Luke Salaam, MD;  Location: [facility] ENDOSCOPY;  Service: Gastroenterology;;   RECTAL EXAM UNDER ANESTHESIA N/A 04/23/2022   Procedure: RECTAL EXAM UNDER ANESTHESIA WITH LIGATION OF BLEEDING;  Surgeon: Emmalene Hare, MD;  Location: [facility] ORS;  Service: General;  Laterality: N/A;   Patient Active Problem List   Diagnosis Date Noted   Chronic venous insufficiency of lower extremity 05/23/2024   History of atrial fibrillation 11/22/2023   Major depression, recurrent, chronic (HCC) 11/22/2023   Adenomatous polyp of colon 10/06/2023   Gastroesophageal reflux disease without esophagitis 05/17/2022   History of syncope 05/17/2022   History of hemorrhoidectomy 05/17/2022   Vitamin D  deficiency 05/17/2022   Lower extremity edema 05/17/2022   History of rectal bleeding 05/17/2022   Morbid obesity (HCC)    Hypertension    Hemiparesis of right dominant side as late effect of cerebral infarction (HCC) 08/13/2019   History of kidney stones 05/02/2018   ED (erectile dysfunction) 05/02/2018   Hyperglycemia 01/30/2017   Arthritis of knee, degenerative 01/30/2017   BPH with obstruction/lower urinary  tract symptoms 02/03/2016   Dyslipidemia 08/04/2015   OSA on CPAP 06/30/2015    PCP: Arleen Lacer, MD  REFERRING PROVIDER: Sowles, Krichna, MD  REFERRING DIAG:  [phone] (ICD-10-CM) - Hemiparesis of right dominant side as late effect of cerebral infarction (HCC)  R26.81 (ICD-10-CM) - Gait instability    THERAPY DIAG:  Abnormality of gait and mobility  Unspecified lack of coordination  Other abnormalities of gait and mobility  Difficulty in walking, not elsewhere classified  Muscle weakness (generalized)  Unsteadiness on feet  Joint  stiffness of both knees  Rationale for Evaluation and Treatment: Rehabilitation  ONSET DATE: chronic  SUBJECTIVE:   SUBJECTIVE STATEMENT: Pt. Reports <5/10 B knee pain walking around PT clinic.  Pt. States he has been walking but limited by knee pain/ endurance.  Pt. Referred back to PT by MD since discharge from PT a few months ago due to regression in LE strength/ endurance.  Pt. Had a R sided stroke on 08/23/2019 and lives with wife and uses [facility] for safety with walking.  Pt. Fearful with walking on varying terrain/ curbs.  No recent falls reported.    PERTINENT HISTORY: Pt. Known well to PT clinic.    PAIN:  Are you having pain? Yes: NPRS scale: 4-5/10  Pain location: B knees Pain description: achy/ persistent Aggravating factors: walking/ step ups Relieving factors: rest  PRECAUTIONS: Fall  RED FLAGS: None   WEIGHT BEARING RESTRICTIONS: No  FALLS:  Has patient fallen in last 6 months? No  LIVING ENVIRONMENT: Lives with: lives with their spouse Lives in: Mobile home Stairs: Yes: External: 5 steps; can reach both Has following equipment at home: Single point cane  OCCUPATION: Retired  PLOF: Independent with household mobility with device  PATIENT GOALS: Increase R sided muscle strength/ walking endurance/ balance.    NEXT MD VISIT: PRN  OBJECTIVE:  Note: Objective measures were completed at Evaluation unless otherwise noted.  PATIENT SURVEYS:  LEFS: 18 out of 80.    COGNITION: Overall cognitive status: Within functional limits for tasks assessed     SENSATION: Light touch: Impaired   EDEMA:  Moderate B lower leg swelling.  Pt. Has compression stockings/ lymphedema pumps.  Marked improvement in lower leg swelling as compared to last PT tx. Session.     MUSCLE LENGTH: Hamstrings: NT Thomas test: Unable to test due to limited supine positioning  POSTURE: rounded shoulders, forward head, and flexed trunk   PALPATION: (+) B knee joint line and lower leg  tenderness.  Pitting edema noted in B lower legs (slightly improved from last PT tx. Session several months ago).    LOWER EXTREMITY ROM:  Active ROM Right eval Left eval  Hip flexion <90 deg. <90 deg.  Hip extension    Hip abduction    Hip adduction    Hip internal rotation    Hip external rotation    Knee flexion    Knee extension    Ankle dorsiflexion    Ankle plantarflexion    Ankle inversion    Ankle eversion     (Blank rows = not tested)  LOWER EXTREMITY MMT:  MMT Right eval Left eval  Hip flexion 4- 4  Hip extension    Hip abduction 4 4+  Hip adduction    Hip internal rotation 4+ 4+  Hip external rotation 4 4  Knee flexion 5 5  Knee extension 5 5  Ankle dorsiflexion    Ankle plantarflexion    Ankle inversion    Ankle  eversion     (Blank rows = not tested)  LOWER EXTREMITY SPECIAL TESTS:  NT  FUNCTIONAL TESTS:  5 times sit to stand: 23.44 sec. (1st attempt), 12.67 sec. (2nd attempt).   Berg Balance Scale: TBD  GAIT: Distance walked: in clinic/ outside on sidewalk Assistive device utilized: Single point cane Level of assistance: Modified independence Comments: slow gait pattern with decreased arm swing, heel strike, reciprocal stride length. R foot ER.  No LOB during tx. Session.  Several seated rest breaks (vital WNL)                                                     TREATMENT DATE: 05/23/2024  Subjective:  Pt. Reports 5-6/10 pain in B knees prior to tx. Session.  Pt. Wearing B lower leg compression stockings to manage significant LE swelling.  Pt. Had f/u with MD this morning (see note).     There.ex.:    No Nustep today.    There.act.:   Navigated obstacle course involving negotiating 6 hurdles in a step through pattern, tapping cones with alternation feet, stepping up and over a 6 in step, balancing on airex pad with eyes closed, and weaving in between cones. 2 laps, CGA from PT.   test: 408 ft  HR: 83 bpm, O2 sat.: 94% Berg:  43/56  5x STS on airex pad from gray chair with added Airex pad with forward press with 4# dumbbell. Removed airex pads d/t excess difficulty after 2 reps, completed 3 reps with just the forward press.   NOT TODAY: 5# ankle wts.: marching in // bars with 5# ankle weights donned,walking in //-bars forward/backwards 3 laps each.  Mirror feedback for posture correction.  Good step length with no UE assist needed.      Seated LAQ 20x with 5# ankle wts.   PATIENT EDUCATION:  Education details: Discussed walking program/ home ex. Person educated: Patient Education method: Medical illustrator Education comprehension: verbalized understanding and returned demonstration  HOME EXERCISE PROGRAM: Standing 3-way hip ex./ walking program.  ASSESSMENT:  CLINICAL IMPRESSION: Pt. Was able to navigate obstacle course with CGA from PT. Pt demonstrated slight lateropulsion when balancing with BLEs on airex pad with eyes closed. He had no LOB during the obstacle course. Pt had difficulty stepping onto the 6 in. Platform with his RLE, though he had a much easier time stepping up on his LLE. Pt was able to ambulate the first 102 ft of his test without use of his SPC, started using SPC for the rest of the test. Pt demonstrated reduced step length B with stiff knees due to pain. Pt scored 43 on the Berg, indicating that a walker would be a more suitable AD for him than his SPC. He demonstrated the most difficulty with picking an object up off the floor and SLS. Pt demonstrated SOB during all activities today.   OBJECTIVE IMPAIRMENTS: Abnormal gait, cardiopulmonary status limiting activity, decreased activity tolerance, decreased balance, decreased coordination, decreased endurance, decreased mobility, difficulty walking, decreased ROM, decreased strength, hypomobility, impaired flexibility, impaired sensation, impaired UE functional use, improper body mechanics, postural dysfunction, obesity, and pain.    ACTIVITY LIMITATIONS: carrying, lifting, bending, standing, squatting, stairs, transfers, bed mobility, bathing, toileting, dressing, hygiene/grooming, and locomotion level  PARTICIPATION LIMITATIONS: meal prep, cleaning, driving, shopping, and community activity  PERSONAL  FACTORS: Past/current experiences are also affecting patient's functional outcome.   REHAB POTENTIAL: Good  CLINICAL DECISION MAKING: Evolving/moderate complexity  EVALUATION COMPLEXITY: Moderate   GOALS: Goals reviewed with patient? Yes  SHORT TERM GOALS: Target date: 06/04/24 Pt. Will increase B hip flexion to >90 deg. In supine position to improve strength/ step length with gait.   Baseline:  see above Goal status: INITIAL  2.  Pt. Will complete 5xSTS in <10 sec. To improve mobility/ decrease fall risk.   Baseline: 5 times sit to stand: 23.44 sec. (1st attempt), 12.67 sec. (2nd attempt).  Goal status: INITIAL   LONG TERM GOALS: Target date: 07/30/24  Pt. Independent with HEP to increase B LE muscle strength 1/2 muscle grade to improve mobility/ decrease fall risk.   Baseline: see above Goal status: INITIAL  2.  Pt. Will complete Berg balance test and score >48/56 to decrease fall risk/ improve walking with least assistive device.   Baseline:  43/56 Goal status: Partially met  3.  Pt. Will increase LEFS to >30 out of 80 to improve pain-free functional mobility.   Baseline: 18 out of 80 Goal status: INITIAL  4.  Pt. Able to ambulate 15 minutes with use of SPC and consistent step pattern on outside surfaces.   Baseline:  increase B knee pain/ limited endurance Goal status: INITIAL  PLAN:  PT FREQUENCY: 1-2x/week  PT DURATION: 12 weeks  PLANNED INTERVENTIONS: 97750- Physical Performance Testing, 97110-Therapeutic exercises, 97530- Therapeutic activity, [id number]- Neuromuscular re-education, 97535- Self Care, 91478- Manual therapy, [phone]- Gait training, [phone]- Electrical stimulation (unattended),  Patient/Family education, Balance training, Stair training, Joint mobilization, Spinal mobilization, Cryotherapy, and Moist heat  PLAN FOR NEXT SESSION: Progress HEP/ walking endurance.     Lendell Quarry, PT, DPT # [phone] Annina Piotrowski, SPT 05/23/2024, 4:35 PM
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
## 2024-05-28 ENCOUNTER — Ambulatory Visit: Admitting: Physical Therapy

## 2024-05-29 NOTE — Telephone Encounter (Unsigned)
 Copied from CRM [id number]. Topic: General - Other >> May 29, 2024 11:21 AM Tobias CROME wrote: Reason for CRM: Pt returning Lorrie's call. Per appt note on awv for tomorrow 05/29/24 NA @ HOME #- LVM @ MOBILE #/L.BARNES [phone]   Attempted to call Lorrie, unable to reach her.   Pt requesting call back, [phone]

## 2024-05-30 ENCOUNTER — Encounter: Payer: Self-pay | Admitting: Physical Therapy

## 2024-05-30 ENCOUNTER — Ambulatory Visit: Payer: Medicare Other

## 2024-05-30 ENCOUNTER — Ambulatory Visit: Admitting: Physical Therapy

## 2024-05-30 DIAGNOSIS — R262 Difficulty in walking, not elsewhere classified: Secondary | ICD-10-CM | POA: Diagnosis not present

## 2024-05-30 DIAGNOSIS — M6281 Muscle weakness (generalized): Secondary | ICD-10-CM

## 2024-05-30 DIAGNOSIS — M25662 Stiffness of left knee, not elsewhere classified: Secondary | ICD-10-CM | POA: Diagnosis not present

## 2024-05-30 DIAGNOSIS — M25661 Stiffness of right knee, not elsewhere classified: Secondary | ICD-10-CM | POA: Diagnosis not present

## 2024-05-30 DIAGNOSIS — R2689 Other abnormalities of gait and mobility: Secondary | ICD-10-CM

## 2024-05-30 DIAGNOSIS — R2681 Unsteadiness on feet: Secondary | ICD-10-CM | POA: Diagnosis not present

## 2024-05-30 DIAGNOSIS — Z0000 Encounter for general adult medical examination without abnormal findings: Secondary | ICD-10-CM

## 2024-05-30 DIAGNOSIS — R279 Unspecified lack of coordination: Secondary | ICD-10-CM

## 2024-05-30 DIAGNOSIS — R269 Unspecified abnormalities of gait and mobility: Secondary | ICD-10-CM | POA: Diagnosis not present

## 2024-05-30 NOTE — Therapy (Signed)
 OUTPATIENT PHYSICAL THERAPY LOWER EXTREMITY TREATMENT  Patient Name: Tom Underwood MRN: [id number] DOB:19-Jun-1951, 73 y.o., male Today's Date: 05/30/2024  END OF SESSION:  PT End of Session - 05/30/24 1438     Visit Number 6    Number of Visits 24    Date for PT Re-Evaluation 07/30/24    PT Start Time 1431    PT Stop Time 1518    PT Time Calculation (min) 47 min    Activity Tolerance Patient tolerated treatment well    Behavior During Therapy WFL for tasks assessed/performed         Past Medical History:  Diagnosis Date   Anxiety    Arthritis    Atrial fibrillation (HCC)    a.) CHA2DS2-VASc = 4 (age, HTN, CVA x2). b.) s/p 200 J synchronized cardioversion (DCCV) on 12/08/2021. c.) rate/rythum maintained on oral metoprolol  tartrate; chronically anticoagulated using rivaroxaban .   BPH (benign prostatic hyperplasia)    DDD (degenerative disc disease), lumbar    Decreased libido    Depression    Diverticulitis    Diverticulosis    GERD (gastroesophageal reflux disease)    Hepatic steatosis    History of 2019 novel coronavirus disease (COVID-19) 12/28/2020   History of cardiac murmur in childhood    History of kidney stones    HLD (hyperlipidemia)    Hypertension    Hypogonadism in male    OSA on CPAP    Pre-diabetes    Rhinitis, allergic    Stroke (HCC) 07/23/2019   a.) LEFT posterior cor radiata. b.) residual RIGHT sided weakness   Past Surgical History:  Procedure Laterality Date   CARDIOVERSION N/A 12/08/2021   Procedure: CARDIOVERSION;  Surgeon: Mady Bruckner, MD;  Location: [facility] ORS;  Service: Cardiovascular;  Laterality: N/A;   COLONOSCOPY     COLONOSCOPY WITH PROPOFOL  N/A 10/06/2023   Procedure: COLONOSCOPY WITH PROPOFOL ;  Surgeon: Therisa Bi, MD;  Location: [facility] ENDOSCOPY;  Service: Gastroenterology;  Laterality: N/A;   EVALUATION UNDER ANESTHESIA WITH HEMORRHOIDECTOMY N/A 04/15/2022   Procedure: EXAM UNDER ANESTHESIA WITH HEMORRHOIDECTOMY;  Surgeon:  Tye Millet, DO;  Location: [facility] ORS;  Service: General;  Laterality: N/A;   EXTERNAL EAR SURGERY     EYE SURGERY     FINGER SURGERY Right    lateration to 5 th digit   KNEE SURGERY     left eye     POLYPECTOMY  10/06/2023   Procedure: POLYPECTOMY;  Surgeon: Therisa Bi, MD;  Location: [facility] ENDOSCOPY;  Service: Gastroenterology;;   RECTAL EXAM UNDER ANESTHESIA N/A 04/23/2022   Procedure: RECTAL EXAM UNDER ANESTHESIA WITH LIGATION OF BLEEDING;  Surgeon: Desiderio Schanz, MD;  Location: [facility] ORS;  Service: General;  Laterality: N/A;   Patient Active Problem List   Diagnosis Date Noted   Chronic venous insufficiency of lower extremity 05/23/2024   History of atrial fibrillation 11/22/2023   Major depression, recurrent, chronic (HCC) 11/22/2023   Adenomatous polyp of colon 10/06/2023   Gastroesophageal reflux disease without esophagitis 05/17/2022   History of syncope 05/17/2022   History of hemorrhoidectomy 05/17/2022   Vitamin D  deficiency 05/17/2022   Lower extremity edema 05/17/2022   History of rectal bleeding 05/17/2022   Morbid obesity (HCC)    Hypertension    Hemiparesis of right dominant side as late effect of cerebral infarction (HCC) 08/13/2019   History of kidney stones 05/02/2018   ED (erectile dysfunction) 05/02/2018   Hyperglycemia 01/30/2017   Arthritis of knee, degenerative 01/30/2017   BPH with obstruction/lower urinary  tract symptoms 02/03/2016   Dyslipidemia 08/04/2015   OSA on CPAP 06/30/2015    PCP: Glenard Mire, MD  REFERRING PROVIDER: Sowles, Krichna, MD  REFERRING DIAG:  [phone] (ICD-10-CM) - Hemiparesis of right dominant side as late effect of cerebral infarction (HCC)  R26.81 (ICD-10-CM) - Gait instability    THERAPY DIAG:  Abnormality of gait and mobility  Unspecified lack of coordination  Other abnormalities of gait and mobility  Difficulty in walking, not elsewhere classified  Muscle weakness (generalized)  Rationale for Evaluation and  Treatment: Rehabilitation  ONSET DATE: chronic  SUBJECTIVE:   SUBJECTIVE STATEMENT: Pt. Reports <5/10 B knee pain walking around PT clinic.  Pt. States he has been walking but limited by knee pain/ endurance.  Pt. Referred back to PT by MD since discharge from PT a few months ago due to regression in LE strength/ endurance.  Pt. Had a R sided stroke on 08/23/2019 and lives with wife and uses [facility] for safety with walking.  Pt. Fearful with walking on varying terrain/ curbs.  No recent falls reported.    PERTINENT HISTORY: Pt. Known well to PT clinic.    PAIN:  Are you having pain? Yes: NPRS scale: 4-5/10  Pain location: B knees Pain description: achy/ persistent Aggravating factors: walking/ step ups Relieving factors: rest  PRECAUTIONS: Fall  RED FLAGS: None   WEIGHT BEARING RESTRICTIONS: No  FALLS:  Has patient fallen in last 6 months? No  LIVING ENVIRONMENT: Lives with: lives with their spouse Lives in: Mobile home Stairs: Yes: External: 5 steps; can reach both Has following equipment at home: Single point cane  OCCUPATION: Retired  PLOF: Independent with household mobility with device  PATIENT GOALS: Increase R sided muscle strength/ walking endurance/ balance.    NEXT MD VISIT: PRN  OBJECTIVE:  Note: Objective measures were completed at Evaluation unless otherwise noted.  PATIENT SURVEYS:  LEFS: 18 out of 80.    COGNITION: Overall cognitive status: Within functional limits for tasks assessed     SENSATION: Light touch: Impaired   EDEMA:  Moderate B lower leg swelling.  Pt. Has compression stockings/ lymphedema pumps.  Marked improvement in lower leg swelling as compared to last PT tx. Session.     MUSCLE LENGTH: Hamstrings: NT Thomas test: Unable to test due to limited supine positioning  POSTURE: rounded shoulders, forward head, and flexed trunk   PALPATION: (+) B knee joint line and lower leg tenderness.  Pitting edema noted in B lower legs  (slightly improved from last PT tx. Session several months ago).    LOWER EXTREMITY ROM:  Active ROM Right eval Left eval  Hip flexion <90 deg. <90 deg.  Hip extension    Hip abduction    Hip adduction    Hip internal rotation    Hip external rotation    Knee flexion    Knee extension    Ankle dorsiflexion    Ankle plantarflexion    Ankle inversion    Ankle eversion     (Blank rows = not tested)  LOWER EXTREMITY MMT:  MMT Right eval Left eval  Hip flexion 4- 4  Hip extension    Hip abduction 4 4+  Hip adduction    Hip internal rotation 4+ 4+  Hip external rotation 4 4  Knee flexion 5 5  Knee extension 5 5  Ankle dorsiflexion    Ankle plantarflexion    Ankle inversion    Ankle eversion     (Blank rows = not tested)  LOWER EXTREMITY SPECIAL TESTS:  NT  FUNCTIONAL TESTS:  5 times sit to stand: 23.44 sec. (1st attempt), 12.67 sec. (2nd attempt).   Berg Balance Scale: TBD  GAIT: Distance walked: in clinic/ outside on sidewalk Assistive device utilized: Single point cane Level of assistance: Modified independence Comments: slow gait pattern with decreased arm swing, heel strike, reciprocal stride length. R foot ER.  No LOB during tx. Session.  Several seated rest breaks (vital WNL)                        test: 408 ft  HR: 83 bpm, O2 sat.: 94% Berg: 43/56                               TREATMENT DATE: 05/30/2024  Subjective:  Pt. Reports 7/10 pain in B knees prior to tx. Session.  Pt. Cancelled last PT appt on Tuesday secondary to GI issues.  Pt. Able to put R shoe on independently today and pts. Wife donned B lower leg compression stockings this morning.   HR: 59 bpm/ O2 sat.: 95%  There.ex.:  No charge  Nustep L5 B UE/LE (0.44 miles)- warm-up/ discussed B lower leg swelling/ knee pain.    There.act.:   Ascending/ descending stairs with B UE assist (6x up/down) prior to seated rest/ water break.    Standing step taps with light to no UE assist on stair  handrails.    Walking in hallway with focus on consistent step pattern/ length/ BOS while maintaining upright posture/ arm swing.  No LOB but limited knee flexion during swing through phase of gait.    STS from gray chair at //-bars with forward posture/ light UE assist after initial stand.    Discussed standing hip/knee ex. For HEP/ walking program.    NOT TODAY: 5# ankle wts.: marching in // bars with 5# ankle weights donned,walking in //-bars forward/backwards 3 laps each.  Mirror feedback for posture correction.  Good step length with no UE assist needed.      Seated LAQ 20x with 5# ankle wts.   PATIENT EDUCATION:  Education details: Discussed walking program/ home ex. Person educated: Patient Education method: Medical illustrator Education comprehension: verbalized understanding and returned demonstration  HOME EXERCISE PROGRAM: Standing 3-way hip ex./ walking program.  ASSESSMENT:  CLINICAL IMPRESSION: Pt. Able to complete ascending/ descending 4 stairs (6x total) with step to gait pattern and UE assist for balance/ safety.  No LOB but increase SOB and fatigue noted after 6th rep.  Pt. Challenged with repeated hip flexion/ step touches secondary to R LE fatigue/ limited knee flexion.  Several seated rest breaks required during tx. Session.  Pt. Instructed to stay active at home and walk every hour around home.  Pt. Will continue to benefit from skilled PT services to increase LE muscle strength to improve safety/ independence with daily activity/ walking.    OBJECTIVE IMPAIRMENTS: Abnormal gait, cardiopulmonary status limiting activity, decreased activity tolerance, decreased balance, decreased coordination, decreased endurance, decreased mobility, difficulty walking, decreased ROM, decreased strength, hypomobility, impaired flexibility, impaired sensation, impaired UE functional use, improper body mechanics, postural dysfunction, obesity, and pain.   ACTIVITY  LIMITATIONS: carrying, lifting, bending, standing, squatting, stairs, transfers, bed mobility, bathing, toileting, dressing, hygiene/grooming, and locomotion level  PARTICIPATION LIMITATIONS: meal prep, cleaning, driving, shopping, and community activity  PERSONAL FACTORS: Past/current experiences are also affecting patient's functional outcome.   REHAB  POTENTIAL: Good  CLINICAL DECISION MAKING: Evolving/moderate complexity  EVALUATION COMPLEXITY: Moderate   GOALS: Goals reviewed with patient? Yes  SHORT TERM GOALS: Target date: 06/04/24 Pt. Will increase B hip flexion to >90 deg. In supine position to improve strength/ step length with gait.   Baseline:  see above Goal status: INITIAL  2.  Pt. Will complete 5xSTS in <10 sec. To improve mobility/ decrease fall risk.   Baseline: 5 times sit to stand: 23.44 sec. (1st attempt), 12.67 sec. (2nd attempt).  Goal status: INITIAL   LONG TERM GOALS: Target date: 07/30/24  Pt. Independent with HEP to increase B LE muscle strength 1/2 muscle grade to improve mobility/ decrease fall risk.   Baseline: see above Goal status: INITIAL  2.  Pt. Will complete Berg balance test and score >48/56 to decrease fall risk/ improve walking with least assistive device.   Baseline:  43/56 Goal status: Partially met  3.  Pt. Will increase LEFS to >30 out of 80 to improve pain-free functional mobility.   Baseline: 18 out of 80 Goal status: INITIAL  4.  Pt. Able to ambulate 15 minutes with use of SPC and consistent step pattern on outside surfaces.   Baseline:  increase B knee pain/ limited endurance Goal status: INITIAL  PLAN:  PT FREQUENCY: 1-2x/week  PT DURATION: 12 weeks  PLANNED INTERVENTIONS: 97750- Physical Performance Testing, 97110-Therapeutic exercises, 97530- Therapeutic activity, 97112- Neuromuscular re-education, 97535- Self Care, [id number]- Manual therapy, [phone]- Gait training, [phone]- Electrical stimulation (unattended), Patient/Family  education, Balance training, Stair training, Joint mobilization, Spinal mobilization, Cryotherapy, and Moist heat  PLAN FOR NEXT SESSION: CHECK GOALS   Ozell JAYSON Sero, PT, DPT # [phone] 05/30/2024, 6:29 PM

## 2024-05-30 NOTE — Progress Notes (Signed)
 Subjective:   Tom Underwood is a 73 y.o. who presents for a Medicare Wellness preventive visit.  As a reminder, Annual Wellness Visits don't include a physical exam, and some assessments may be limited, especially if this visit is performed virtually. We may recommend an in-person follow-up visit with your provider if needed.  Visit Complete: Virtual I connected with  Tom Underwood on 05/30/24 by a audio enabled telemedicine application and verified that I am speaking with the correct person using two identifiers.  Patient Location: Home  Provider Location: Home Office  I discussed the limitations of evaluation and management by telemedicine. The patient expressed understanding and agreed to proceed.  Vital Signs: Because this visit was a virtual/telehealth visit, some criteria may be missing or patient reported. Any vitals not documented were not able to be obtained and vitals that have been documented are patient reported.  VideoDeclined- This patient declined Librarian, academic. Therefore the visit was completed with audio only.  Persons Participating in Visit: Patient.  AWV Questionnaire: No: Patient Medicare AWV questionnaire was not completed prior to this visit.  Cardiac Risk Factors include: advanced age (>66men, >40 women);hypertension;dyslipidemia;male gender;sedentary lifestyle;obesity (BMI >30kg/m2)     Objective:    Today's Vitals   05/30/24 1248  PainSc: 4    There is no height or weight on file to calculate BMI.     05/30/2024   12:57 PM 10/06/2023    9:22 AM 05/26/2023    1:53 PM 05/17/2022   10:13 AM 04/23/2022    6:48 PM 04/15/2022    6:59 AM 04/07/2022    3:21 PM  Advanced Directives  Does Patient Have a Medical Advance Directive? No Yes Yes Yes No No Yes  Type of Special educational needs teacher of [place];Living will Healthcare Power of [place];Living will Healthcare Power of Attorney     Copy of Healthcare Power of  Attorney in Chart?    No - copy requested     Would patient like information on creating a medical advance directive? No - Patient declined    No - Patient declined No - Patient declined     Current Medications (verified) Outpatient Encounter Medications as of 05/30/2024  Medication Sig   aspirin  EC 81 MG tablet Take 1 tablet (81 mg total) by mouth daily. Swallow whole.   buPROPion  (WELLBUTRIN  XL) 300 MG 24 hr tablet Take 1 tablet (300 mg total) by mouth every morning.   busPIRone  (BUSPAR ) 10 MG tablet Take 1 tablet (10 mg total) by mouth 2 (two) times daily.   Cholecalciferol (VITAMIN D ) 125 MCG (5000 UT) CAPS Take 5,000 Units by mouth daily.   finasteride  (PROSCAR ) 5 MG tablet Take 1 tablet (5 mg total) by mouth daily.   fluticasone  (FLONASE ) 50 MCG/ACT nasal spray SPRAY 2 SPRAYS INTO EACH NOSTRIL EVERY DAY   furosemide  (LASIX ) 20 MG tablet Take 1 tablet (20 mg total) by mouth as needed.   GLUCOSAMINE-CHONDROITIN PO Take 2 tablets by mouth in the morning and at bedtime.   ketoconazole (NIZORAL) 2 % shampoo Apply topically.   lidocaine  (XYLOCAINE ) 5 % ointment Apply 1 application. topically 3 (three) times daily as needed. Apply pea size amount to area as needed   metoprolol  tartrate (LOPRESSOR ) 25 MG tablet Take 1 tablet (25 mg total) by mouth 2 (two) times daily.   Multiple Vitamin (MULTIVITAMIN) tablet Take 1 tablet by mouth daily.   omega-3 acid ethyl esters (LOVAZA ) 1 g capsule Take 2 capsules (  2 g total) by mouth 2 (two) times daily.   pantoprazole  (PROTONIX ) 20 MG tablet Take 1 tablet (20 mg total) by mouth daily.   rosuvastatin  (CRESTOR ) 20 MG tablet Take 1 tablet (20 mg total) by mouth daily.   Saccharomyces boulardii (PROBIOTIC) 250 MG CAPS Take 1 capsule by mouth daily.   tamsulosin  (FLOMAX ) 0.4 MG CAPS capsule Take 1 capsule (0.4 mg total) by mouth daily.   tetrahydrozoline 0.05 % ophthalmic solution Place 1 drop into both eyes 2 (two) times daily.   valsartan  (DIOVAN ) 80 MG  tablet Take 1 tablet (80 mg total) by mouth daily.   VISINE DRY EYE RELIEF 1 % SOLN Place 1 drop into both eyes daily as needed (dry eyes). (Patient not taking: Reported on 05/30/2024)   No facility-administered encounter medications on file as of 05/30/2024.    Allergies (verified) Penicillins   History: Past Medical History:  Diagnosis Date   Anxiety    Arthritis    Atrial fibrillation (HCC)    a.) CHA2DS2-VASc = 4 (age, HTN, CVA x2). b.) s/p 200 J synchronized cardioversion (DCCV) on 12/08/2021. c.) rate/rythum maintained on oral metoprolol  tartrate; chronically anticoagulated using rivaroxaban .   BPH (benign prostatic hyperplasia)    DDD (degenerative disc disease), lumbar    Decreased libido    Depression    Diverticulitis    Diverticulosis    GERD (gastroesophageal reflux disease)    Hepatic steatosis    History of 2019 novel coronavirus disease (COVID-19) 12/28/2020   History of cardiac murmur in childhood    History of kidney stones    HLD (hyperlipidemia)    Hypertension    Hypogonadism in male    OSA on CPAP    Pre-diabetes    Rhinitis, allergic    Stroke (HCC) 07/23/2019   a.) LEFT posterior cor radiata. b.) residual RIGHT sided weakness   Past Surgical History:  Procedure Laterality Date   CARDIOVERSION N/A 12/08/2021   Procedure: CARDIOVERSION;  Surgeon: Mady Bruckner, MD;  Location: [facility] ORS;  Service: Cardiovascular;  Laterality: N/A;   COLONOSCOPY     COLONOSCOPY WITH PROPOFOL  N/A 10/06/2023   Procedure: COLONOSCOPY WITH PROPOFOL ;  Surgeon: Therisa Bi, MD;  Location: [facility] ENDOSCOPY;  Service: Gastroenterology;  Laterality: N/A;   EVALUATION UNDER ANESTHESIA WITH HEMORRHOIDECTOMY N/A 04/15/2022   Procedure: EXAM UNDER ANESTHESIA WITH HEMORRHOIDECTOMY;  Surgeon: Tye Millet, DO;  Location: [facility] ORS;  Service: General;  Laterality: N/A;   EXTERNAL EAR SURGERY     EYE SURGERY     FINGER SURGERY Right    lateration to 5 th digit   KNEE SURGERY     left  eye     POLYPECTOMY  10/06/2023   Procedure: POLYPECTOMY;  Surgeon: Therisa Bi, MD;  Location: [facility] ENDOSCOPY;  Service: Gastroenterology;;   RECTAL EXAM UNDER ANESTHESIA N/A 04/23/2022   Procedure: RECTAL EXAM UNDER ANESTHESIA WITH LIGATION OF BLEEDING;  Surgeon: Desiderio Schanz, MD;  Location: [facility] ORS;  Service: General;  Laterality: N/A;   Family History  Problem Relation Age of Onset   Asthma Mother    Heart disease Mother    Heart attack Mother    Dementia Mother    Asthma Father    Heart disease Father    Stroke Father    Social History   Socioeconomic History   Marital status: Married    Spouse name: Arland    Number of children: 2   Years of education: Not on file   Highest education level: Not on  file  Occupational History   Occupation: retired   Tobacco Use   Smoking status: Former    Types: Pipe    Start date: 02/20/1973    Quit date: 02/20/1974    Years since quitting: 50.3   Smokeless tobacco: Never  Vaping Use   Vaping status: Never Used  Substance and Sexual Activity   Alcohol use: No    Alcohol/week: 0.0 standard drinks of alcohol   Drug use: No   Sexual activity: Yes    Partners: Female  Other Topics Concern   Not on file  Social History Narrative   Not on file   Social Drivers of Health   Financial Resource Strain: Low Risk  (05/30/2024)   Overall Financial Resource Strain (CARDIA)    Difficulty of Paying Living Expenses: Not hard at all  Food Insecurity: No Food Insecurity (05/30/2024)   Hunger Vital Sign    Worried About Running Out of Food in the Last Year: Never true    Ran Out of Food in the Last Year: Never true  Transportation Needs: No Transportation Needs (05/30/2024)   PRAPARE - Administrator, Civil Service (Medical): No    Lack of Transportation (Non-Medical): No  Physical Activity: Insufficiently Active (05/30/2024)   Exercise Vital Sign    Days of Exercise per Week: 7 days    Minutes of Exercise per Session: 10 min   Stress: No Stress Concern Present (05/30/2024)   [employer] of Occupational Health - Occupational Stress Questionnaire    Feeling of Stress: Not at all  Social Connections: Moderately Integrated (05/30/2024)   Social Connection and Isolation Panel    Frequency of Communication with Friends and Family: Three times a week    Frequency of Social Gatherings with Friends and Family: Never    Attends Religious Services: Never    Database administrator or Organizations: Yes    Attends Engineer, structural: More than 4 times per year    Marital Status: Married    Tobacco Counseling Counseling given: Not Answered    Clinical Intake:  Pre-visit preparation completed: Yes  Pain : 0-10 Pain Score: 4  Pain Type: Chronic pain Pain Location: Knee Pain Orientation: Right, Left Pain Descriptors / Indicators: Aching, Discomfort, Constant Pain Onset: More than a month ago Pain Frequency: Constant     BMI - recorded: 43.6 Nutritional Status: BMI > 30  Obese Nutritional Risks: None Diabetes: No  Lab Results  Component Value Date   HGBA1C 5.6 06/30/2023   HGBA1C 5.2 07/01/2022   HGBA1C 5.5 05/28/2021     How often do you need to have someone help you when you read instructions, pamphlets, or other written materials from your doctor or pharmacy?: 1 - Never  Interpreter Needed?: No  Information entered by :: JHONNIE DAS, LPN   Activities of Daily Living    05/30/2024   12:59 PM  In your present state of health, do you have any difficulty performing the following activities:  Hearing? 0  Vision? 0  Difficulty concentrating or making decisions? 0  Walking or climbing stairs? 1  Comment USES CANE  Dressing or bathing? 0  Doing errands, shopping? 0  Preparing Food and eating ? N  Using the Toilet? N  In the past six months, have you accidently leaked urine? N  Do you have problems with loss of bowel control? N  Managing your Medications? N  Managing your  Finances? N  Housekeeping or managing your Housekeeping? N  Patient Care Team: Sowles, Krichna, MD as PCP - General (Family Medicine) Darliss Rogue, MD as PCP - Cardiology (Cardiology) Cindie Ole DASEN, MD as PCP - Electrophysiology (Cardiology) Loreda Hacker, DPM as Consulting Physician (Podiatry) Helon Clotilda DELENA RIGGERS as Physician Assistant (Urology) Maree Jannett POUR, MD as Consulting Physician (Neurology) Tye Millet, DO as Consulting Physician (Surgery) Ileene Ozell BROCKS, PT as Physical Therapist (Physical Therapy) Myrna Adine Anes, MD as Consulting Physician (Ophthalmology)  I have updated your Care Teams any recent Medical Services you may have received from other providers in the past year.     Assessment:   This is a routine wellness examination for [place].  Hearing/Vision screen Hearing Screening - Comments:: NO AIDS Vision Screening - Comments:: WEARS GLASSES ALL DAY-    Goals Addressed             This Visit's Progress    DIET - EAT MORE FRUITS AND VEGETABLES         Depression Screen     05/30/2024   12:54 PM 05/23/2024   10:05 AM 11/22/2023   10:44 AM 11/20/2023    2:22 PM 05/26/2023    1:41 PM 05/24/2023   11:03 AM 02/16/2023   11:27 AM  PHQ 2/9 Scores  PHQ - 2 Score 0 2 2  2  0 0  PHQ- 9 Score 1 4 10  4 3  0     Information is confidential and restricted. Go to Review Flowsheets to unlock data.    Fall Risk     05/30/2024   12:59 PM 05/23/2024   10:02 AM 11/22/2023   10:44 AM 05/26/2023    1:36 PM 05/24/2023   10:57 AM  Fall Risk   Falls in the past year? 0 0 0 0 0  Number falls in past yr: 0 0 0 0   Injury with Fall? 0 0 0 0   Risk for fall due to : No Fall Risks No Fall Risks No Fall Risks No Fall Risks Impaired balance/gait  Follow up Falls evaluation completed Falls prevention discussed;Education provided;Falls evaluation completed Falls prevention discussed;Education provided;Falls evaluation completed Falls prevention  discussed;Education provided Falls prevention discussed;Falls evaluation completed;Education provided    MEDICARE RISK AT HOME:  Medicare Risk at Home Any stairs in or around the home?: Yes If so, are there any without handrails?: No Home free of loose throw rugs in walkways, pet beds, electrical cords, etc?: Yes Adequate lighting in your home to reduce risk of falls?: Yes Life alert?: No Use of a cane, walker or w/c?: Yes (CANE ALL THE TIME) Grab bars in the bathroom?: Yes Shower chair or bench in shower?: Yes Elevated toilet seat or a handicapped toilet?: Yes  TIMED UP AND GO:  Was the test performed?  No  Cognitive Function: 6CIT completed        05/30/2024    1:01 PM 05/26/2023    1:54 PM 04/21/2020    9:43 AM  6CIT Screen  What Year? 0 points 0 points 0 points  What month? 0 points 0 points 0 points  What time? 0 points 0 points 0 points  Count back from 20 0 points 0 points 0 points  Months in reverse 0 points 0 points 0 points  Repeat phrase 0 points 0 points 0 points  Total Score 0 points 0 points 0 points    Immunizations Immunization History  Administered Date(s) Administered   Fluad Quad(high Dose 65+) 09/18/2019, 11/10/2020, 10/18/2021, 08/24/2022   Fluad Trivalent(High Dose 65+)  11/22/2023   Influenza, High Dose Seasonal PF 10/05/2018   Influenza,inj,Quad PF,6+ Mos 08/30/2017   Influenza-Unspecified 09/26/2016   Moderna Covid-19 Vaccine Bivalent Booster 45yrs & up 03/31/2022   Moderna Sars-Covid-2 Vaccination 01/21/2020, 02/18/2020, 11/23/2020   Pneumococcal Conjugate-13 11/05/2019   Pneumococcal Polysaccharide-23 10/19/2018   Tdap 02/16/2018, 07/14/2020   Zoster, Live 02/24/2017    Screening Tests Health Maintenance  Topic Date Due   COVID-19 Vaccine (5 - 2024-25 season) 06/08/2024 (Originally 08/06/2023)   Zoster Vaccines- Shingrix  (1 of 2) 08/23/2024 (Originally 03/18/1970)   INFLUENZA VACCINE  07/05/2024   Medicare Annual Wellness (AWV)   05/30/2025   DTaP/Tdap/Td (3 - Td or Tdap) 07/14/2030   Colonoscopy  10/05/2033   Pneumococcal Vaccine: 50+ Years  Completed   Hepatitis C Screening  Completed   Hepatitis B Vaccines  Aged Out   HPV VACCINES  Aged Out   Meningococcal B Vaccine  Aged Out    Health Maintenance  There are no preventive care reminders to display for this patient. Health Maintenance Items Addressed: COLONOSCOPY UP TO DATE; UP TO DATE ON TDAP, NEEDS SHINGRIX , COVID, PNA  Additional Screening:  Vision Screening: Recommended annual ophthalmology exams for early detection of glaucoma and other disorders of the eye. Would you like a referral to an eye doctor? No    Dental Screening: Recommended annual dental exams for proper oral hygiene  Community Resource Referral / Chronic Care Management: CRR required this visit?  No   CCM required this visit?  No   Plan:    I have personally reviewed and noted the following in the patient's chart:   Medical and social history Use of alcohol, tobacco or illicit drugs  Current medications and supplements including opioid prescriptions. Patient is not currently taking opioid prescriptions. Functional ability and status Nutritional status Physical activity Advanced directives List of other physicians Hospitalizations, surgeries, and ER visits in previous 12 months Vitals Screenings to include cognitive, depression, and falls Referrals and appointments  In addition, I have reviewed and discussed with patient certain preventive protocols, quality metrics, and best practice recommendations. A written personalized care plan for preventive services as well as general preventive health recommendations were provided to patient.   Jhonnie GORMAN Das, LPN   [id number]   After Visit Summary: (MyChart) Due to this being a telephonic visit, the after visit summary with patients personalized plan was offered to patient via MyChart   Notes: Nothing significant to report at  this time.

## 2024-05-30 NOTE — Patient Instructions (Addendum)
 [name] , Thank you for taking time out of your busy schedule to complete your Annual Wellness Visit with me. I enjoyed our conversation and look forward to speaking with you again next year. I, as well as your care team,  appreciate your ongoing commitment to your health goals. Please review the following plan we discussed and let me know if I can assist you in the future.    Follow up Visits: Next Medicare AWV with our clinical staff:   06/12/25 @ 10:10 AM BY PHONE Have you seen your provider in the last 6 months (3 months if uncontrolled diabetes)? Yes  Clinician Recommendations:  Aim for 30 minutes of exercise or brisk walking, 6-8 glasses of water, and 5 servings of fruits and vegetables each day. TAKE CARE!      This is a list of the screening recommended for you and due dates:  Health Maintenance  Topic Date Due   COVID-19 Vaccine (5 - 2024-25 season) 06/08/2024*   Zoster (Shingles) Vaccine (1 of 2) 08/23/2024*   Flu Shot  07/05/2024   Medicare Annual Wellness Visit  05/30/2025   DTaP/Tdap/Td vaccine (3 - Td or Tdap) 07/14/2030   Colon Cancer Screening  10/05/2033   Pneumococcal Vaccine for age over 62  Completed   Hepatitis C Screening  Completed   Hepatitis B Vaccine  Aged Out   HPV Vaccine  Aged Out   Meningitis B Vaccine  Aged Out  *Topic was postponed. The date shown is not the original due date.    Advanced directives: (ACP Link)Information on Advanced Care Planning can be found at [place]  Secretary of [facility] Advance Health Care Directives Advance Health Care Directives. [email or web address]  Advance Care Planning is important because it:  [x]  Makes sure you receive the medical care that is consistent with your values, goals, and preferences  [x]  It provides guidance to your family and loved ones and reduces their decisional burden about whether or not they are making the right decisions based on your wishes.  Follow the link provided in your after visit summary or read  over the paperwork we have mailed to you to help you started getting your Advance Directives in place. If you need assistance in completing these, please reach out to us  so that we can help you!

## 2024-06-04 ENCOUNTER — Ambulatory Visit: Attending: Family Medicine | Admitting: Physical Therapy

## 2024-06-04 ENCOUNTER — Encounter: Payer: Self-pay | Admitting: Physical Therapy

## 2024-06-04 DIAGNOSIS — M25661 Stiffness of right knee, not elsewhere classified: Secondary | ICD-10-CM | POA: Insufficient documentation

## 2024-06-04 DIAGNOSIS — R269 Unspecified abnormalities of gait and mobility: Secondary | ICD-10-CM | POA: Diagnosis not present

## 2024-06-04 DIAGNOSIS — R2689 Other abnormalities of gait and mobility: Secondary | ICD-10-CM | POA: Insufficient documentation

## 2024-06-04 DIAGNOSIS — M25611 Stiffness of right shoulder, not elsewhere classified: Secondary | ICD-10-CM | POA: Insufficient documentation

## 2024-06-04 DIAGNOSIS — M6281 Muscle weakness (generalized): Secondary | ICD-10-CM | POA: Insufficient documentation

## 2024-06-04 DIAGNOSIS — R279 Unspecified lack of coordination: Secondary | ICD-10-CM | POA: Diagnosis not present

## 2024-06-04 DIAGNOSIS — R2681 Unsteadiness on feet: Secondary | ICD-10-CM | POA: Diagnosis not present

## 2024-06-04 DIAGNOSIS — R262 Difficulty in walking, not elsewhere classified: Secondary | ICD-10-CM | POA: Diagnosis not present

## 2024-06-04 DIAGNOSIS — M25511 Pain in right shoulder: Secondary | ICD-10-CM | POA: Insufficient documentation

## 2024-06-04 DIAGNOSIS — G8929 Other chronic pain: Secondary | ICD-10-CM | POA: Diagnosis not present

## 2024-06-04 DIAGNOSIS — M25662 Stiffness of left knee, not elsewhere classified: Secondary | ICD-10-CM | POA: Diagnosis not present

## 2024-06-04 NOTE — Therapy (Signed)
 OUTPATIENT PHYSICAL THERAPY LOWER EXTREMITY TREATMENT  Patient Name: Tom Underwood MRN: [id number] DOB:26-Jun-1951, 73 y.o., male Today's Date: 06/05/2024  END OF SESSION:  PT End of Session - 06/04/24 1441     Visit Number 7    Number of Visits 24    Date for PT Re-Evaluation 07/30/24    PT Start Time 1442    PT Stop Time 1540    PT Time Calculation (min) 58 min    Activity Tolerance Patient tolerated treatment well    Behavior During Therapy WFL for tasks assessed/performed         Past Medical History:  Diagnosis Date   Anxiety    Arthritis    Atrial fibrillation (HCC)    a.) CHA2DS2-VASc = 4 (age, HTN, CVA x2). b.) s/p 200 J synchronized cardioversion (DCCV) on 12/08/2021. c.) rate/rythum maintained on oral metoprolol  tartrate; chronically anticoagulated using rivaroxaban .   BPH (benign prostatic hyperplasia)    DDD (degenerative disc disease), lumbar    Decreased libido    Depression    Diverticulitis    Diverticulosis    GERD (gastroesophageal reflux disease)    Hepatic steatosis    History of 2019 novel coronavirus disease (COVID-19) 12/28/2020   History of cardiac murmur in childhood    History of kidney stones    HLD (hyperlipidemia)    Hypertension    Hypogonadism in male    OSA on CPAP    Pre-diabetes    Rhinitis, allergic    Stroke (HCC) 07/23/2019   a.) LEFT posterior cor radiata. b.) residual RIGHT sided weakness   Past Surgical History:  Procedure Laterality Date   CARDIOVERSION N/A 12/08/2021   Procedure: CARDIOVERSION;  Surgeon: Mady Bruckner, MD;  Location: [facility] ORS;  Service: Cardiovascular;  Laterality: N/A;   COLONOSCOPY     COLONOSCOPY WITH PROPOFOL  N/A 10/06/2023   Procedure: COLONOSCOPY WITH PROPOFOL ;  Surgeon: Therisa Bi, MD;  Location: [facility] ENDOSCOPY;  Service: Gastroenterology;  Laterality: N/A;   EVALUATION UNDER ANESTHESIA WITH HEMORRHOIDECTOMY N/A 04/15/2022   Procedure: EXAM UNDER ANESTHESIA WITH HEMORRHOIDECTOMY;  Surgeon:  Tye Millet, DO;  Location: [facility] ORS;  Service: General;  Laterality: N/A;   EXTERNAL EAR SURGERY     EYE SURGERY     FINGER SURGERY Right    lateration to 5 th digit   KNEE SURGERY     left eye     POLYPECTOMY  10/06/2023   Procedure: POLYPECTOMY;  Surgeon: Therisa Bi, MD;  Location: [facility] ENDOSCOPY;  Service: Gastroenterology;;   RECTAL EXAM UNDER ANESTHESIA N/A 04/23/2022   Procedure: RECTAL EXAM UNDER ANESTHESIA WITH LIGATION OF BLEEDING;  Surgeon: Desiderio Schanz, MD;  Location: [facility] ORS;  Service: General;  Laterality: N/A;   Patient Active Problem List   Diagnosis Date Noted   Chronic venous insufficiency of lower extremity 05/23/2024   History of atrial fibrillation 11/22/2023   Major depression, recurrent, chronic (HCC) 11/22/2023   Adenomatous polyp of colon 10/06/2023   Gastroesophageal reflux disease without esophagitis 05/17/2022   History of syncope 05/17/2022   History of hemorrhoidectomy 05/17/2022   Vitamin D  deficiency 05/17/2022   Lower extremity edema 05/17/2022   History of rectal bleeding 05/17/2022   Morbid obesity (HCC)    Hypertension    Hemiparesis of right dominant side as late effect of cerebral infarction (HCC) 08/13/2019   History of kidney stones 05/02/2018   ED (erectile dysfunction) 05/02/2018   Hyperglycemia 01/30/2017   Arthritis of knee, degenerative 01/30/2017   BPH with obstruction/lower urinary  tract symptoms 02/03/2016   Dyslipidemia 08/04/2015   OSA on CPAP 06/30/2015    PCP: Glenard Mire, MD  REFERRING PROVIDER: Sowles, Krichna, MD  REFERRING DIAG:  [phone] (ICD-10-CM) - Hemiparesis of right dominant side as late effect of cerebral infarction (HCC)  R26.81 (ICD-10-CM) - Gait instability    THERAPY DIAG:  Abnormality of gait and mobility  Unspecified lack of coordination  Other abnormalities of gait and mobility  Difficulty in walking, not elsewhere classified  Muscle weakness (generalized)  Rationale for Evaluation and  Treatment: Rehabilitation  ONSET DATE: chronic  SUBJECTIVE:   SUBJECTIVE STATEMENT: Pt. Reports <5/10 B knee pain walking around PT clinic.  Pt. States he has been walking but limited by knee pain/ endurance.  Pt. Referred back to PT by MD since discharge from PT a few months ago due to regression in LE strength/ endurance.  Pt. Had a R sided stroke on 08/23/2019 and lives with wife and uses [facility] for safety with walking.  Pt. Fearful with walking on varying terrain/ curbs.  No recent falls reported.    PERTINENT HISTORY: Pt. Known well to PT clinic.    PAIN:  Are you having pain? Yes: NPRS scale: 4-5/10  Pain location: B knees Pain description: achy/ persistent Aggravating factors: walking/ step ups Relieving factors: rest  PRECAUTIONS: Fall  RED FLAGS: None   WEIGHT BEARING RESTRICTIONS: No  FALLS:  Has patient fallen in last 6 months? No  LIVING ENVIRONMENT: Lives with: lives with their spouse Lives in: Mobile home Stairs: Yes: External: 5 steps; can reach both Has following equipment at home: Single point cane  OCCUPATION: Retired  PLOF: Independent with household mobility with device  PATIENT GOALS: Increase R sided muscle strength/ walking endurance/ balance.    NEXT MD VISIT: PRN  OBJECTIVE:  Note: Objective measures were completed at Evaluation unless otherwise noted.  PATIENT SURVEYS:  LEFS: 18 out of 80.    COGNITION: Overall cognitive status: Within functional limits for tasks assessed     SENSATION: Light touch: Impaired   EDEMA:  Moderate B lower leg swelling.  Pt. Has compression stockings/ lymphedema pumps.  Marked improvement in lower leg swelling as compared to last PT tx. Session.     MUSCLE LENGTH: Hamstrings: NT Thomas test: Unable to test due to limited supine positioning  POSTURE: rounded shoulders, forward head, and flexed trunk   PALPATION: (+) B knee joint line and lower leg tenderness.  Pitting edema noted in B lower legs  (slightly improved from last PT tx. Session several months ago).    LOWER EXTREMITY ROM:  Active ROM Right eval Left eval  Hip flexion <90 deg. <90 deg.  Hip extension    Hip abduction    Hip adduction    Hip internal rotation    Hip external rotation    Knee flexion    Knee extension    Ankle dorsiflexion    Ankle plantarflexion    Ankle inversion    Ankle eversion     (Blank rows = not tested)  LOWER EXTREMITY MMT:  MMT Right eval Left eval  Hip flexion 4- 4  Hip extension    Hip abduction 4 4+  Hip adduction    Hip internal rotation 4+ 4+  Hip external rotation 4 4  Knee flexion 5 5  Knee extension 5 5  Ankle dorsiflexion    Ankle plantarflexion    Ankle inversion    Ankle eversion     (Blank rows = not tested)  LOWER EXTREMITY SPECIAL TESTS:  NT  FUNCTIONAL TESTS:  5 times sit to stand: 23.44 sec. (1st attempt), 12.67 sec. (2nd attempt).   Berg Balance Scale: TBD  GAIT: Distance walked: in clinic/ outside on sidewalk Assistive device utilized: Single point cane Level of assistance: Modified independence Comments: slow gait pattern with decreased arm swing, heel strike, reciprocal stride length. R foot ER.  No LOB during tx. Session.  Several seated rest breaks (vital WNL)                        test: 408 ft  HR: 83 bpm, O2 sat.: 94% Berg: 43/56                               TREATMENT DATE: 06/05/2024  Subjective:  Pt. Reports 9/10 pain in R knee/ 6/10 L knee pain prior to tx. Session.  No reason reported why R knee pain is higher today.    LEFS:  22 out of 80  (4 point improvement).    There.ex.:  No charge  Nustep L5 B UE/LE (0.53 miles)- warm-up/ discussed B lower leg swelling/ knee pain.  O2: 95%, HR: 63 bpm.    There.act.:   Walking around PT clinic with use of SPC (gait reassessment).  Pt. Ambulates in gym carrying [facility] with instruction to increase hip/knee flexion during swing through phase of gait.  Limited step length without use of  SPC.      Airex pad step ups/down on L/R in //-bars with light to no UE assist.    High marching in //-bars/ turning at end CW/CCW.  No UE assist.    Ascending/ descending stairs with B UE assist (6x up/down) prior to seated rest/ water break.    Standing step taps with light to no UE assist on stair handrails.    Walking in hallway with focus on consistent step pattern/ length/ BOS while maintaining upright posture/ arm swing.  No LOB but limited knee flexion during swing through phase of gait.   Discussed lower leg swelling and importance of compression stocking use.     NOT TODAY: 5# ankle wts.: marching in // bars with 5# ankle weights donned,walking in //-bars forward/backwards 3 laps each.  Mirror feedback for posture correction.  Good step length with no UE assist needed.      Seated LAQ 20x with 5# ankle wts.  STS from gray chair at //-bars with forward posture/ light UE assist after initial stand.    PATIENT EDUCATION:  Education details: Discussed walking program/ home ex. Person educated: Patient Education method: Medical illustrator Education comprehension: verbalized understanding and returned demonstration  HOME EXERCISE PROGRAM: Standing 3-way hip ex./ walking program.  ASSESSMENT:  CLINICAL IMPRESSION: Pt. Works hard during tx. Session but requires several extended seated rest breaks.  Pts. Vitals remained WNL t/o treatment session. No LOB but increase SOB and fatigue noted after 6th rep. On stairs/ repetitive step touches.  Pt. Challenged with STS with some UE assist on legs/ armrests.  Pt. Instructed to stay active at home and walk every hour around home.  Pt. Will continue to benefit from skilled PT services to increase LE muscle strength to improve safety/ independence with daily activity/ walking.    OBJECTIVE IMPAIRMENTS: Abnormal gait, cardiopulmonary status limiting activity, decreased activity tolerance, decreased balance, decreased coordination,  decreased endurance, decreased mobility, difficulty walking, decreased ROM, decreased strength, hypomobility, impaired  flexibility, impaired sensation, impaired UE functional use, improper body mechanics, postural dysfunction, obesity, and pain.   ACTIVITY LIMITATIONS: carrying, lifting, bending, standing, squatting, stairs, transfers, bed mobility, bathing, toileting, dressing, hygiene/grooming, and locomotion level  PARTICIPATION LIMITATIONS: meal prep, cleaning, driving, shopping, and community activity  PERSONAL FACTORS: Past/current experiences are also affecting patient's functional outcome.   REHAB POTENTIAL: Good  CLINICAL DECISION MAKING: Evolving/moderate complexity  EVALUATION COMPLEXITY: Moderate   GOALS: Goals reviewed with patient? Yes  SHORT TERM GOALS: Target date: 06/04/24 Pt. Will increase B hip flexion to >90 deg. In supine position to improve strength/ step length with gait.   Baseline:  see above Goal status: INITIAL  2.  Pt. Will complete 5xSTS in <10 sec. To improve mobility/ decrease fall risk.   Baseline: 5 times sit to stand: 23.44 sec. (1st attempt), 12.67 sec. (2nd attempt).  Goal status: INITIAL   LONG TERM GOALS: Target date: 07/30/24  Pt. Independent with HEP to increase B LE muscle strength 1/2 muscle grade to improve mobility/ decrease fall risk.   Baseline: see above Goal status: INITIAL  2.  Pt. Will complete Berg balance test and score >48/56 to decrease fall risk/ improve walking with least assistive device.   Baseline:  43/56 Goal status: Partially met  3.  Pt. Will increase LEFS to >30 out of 80 to improve pain-free functional mobility.   Baseline: 18 out of 80.  7/1: 22 out of 80 Goal status: Not met  4.  Pt. Able to ambulate 15 minutes with use of SPC and consistent step pattern on outside surfaces.   Baseline:  increase B knee pain/ limited endurance Goal status: INITIAL  PLAN:  PT FREQUENCY: 1-2x/week  PT DURATION: 12  weeks  PLANNED INTERVENTIONS: 97750- Physical Performance Testing, 97110-Therapeutic exercises, 97530- Therapeutic activity, 97112- Neuromuscular re-education, 97535- Self Care, 02859- Manual therapy, [phone]- Gait training, [phone]- Electrical stimulation (unattended), Patient/Family education, Balance training, Stair training, Joint mobilization, Spinal mobilization, Cryotherapy, and Moist heat  PLAN FOR NEXT SESSION: CHECK STGs   Ozell JAYSON Sero, PT, DPT # [phone] 06/05/2024, 5:54 PM

## 2024-06-05 ENCOUNTER — Ambulatory Visit: Payer: PRIVATE HEALTH INSURANCE | Admitting: Professional

## 2024-06-06 ENCOUNTER — Ambulatory Visit: Admitting: Physical Therapy

## 2024-06-11 ENCOUNTER — Ambulatory Visit: Admitting: Physical Therapy

## 2024-06-11 ENCOUNTER — Encounter: Payer: Self-pay | Admitting: Physical Therapy

## 2024-06-11 DIAGNOSIS — R262 Difficulty in walking, not elsewhere classified: Secondary | ICD-10-CM | POA: Diagnosis not present

## 2024-06-11 DIAGNOSIS — M6281 Muscle weakness (generalized): Secondary | ICD-10-CM | POA: Diagnosis not present

## 2024-06-11 DIAGNOSIS — R2689 Other abnormalities of gait and mobility: Secondary | ICD-10-CM | POA: Diagnosis not present

## 2024-06-11 DIAGNOSIS — R269 Unspecified abnormalities of gait and mobility: Secondary | ICD-10-CM | POA: Diagnosis not present

## 2024-06-11 DIAGNOSIS — M25661 Stiffness of right knee, not elsewhere classified: Secondary | ICD-10-CM | POA: Diagnosis not present

## 2024-06-11 DIAGNOSIS — R279 Unspecified lack of coordination: Secondary | ICD-10-CM

## 2024-06-11 NOTE — Therapy (Signed)
 OUTPATIENT PHYSICAL THERAPY LOWER EXTREMITY TREATMENT  Patient Name: Tom Underwood MRN: [id number] DOB:[birth year], 73 y.o., male Today's Date: 06/11/2024  END OF SESSION:  PT End of Session - 06/11/24 1503     Visit Number 8    Number of Visits 24    Date for PT Re-Evaluation 07/30/24    PT Start Time 1442    PT Stop Time 1536    PT Time Calculation (min) 54 min    Activity Tolerance Patient tolerated treatment well    Behavior During Therapy WFL for tasks assessed/performed         Past Medical History:  Diagnosis Date   Anxiety    Arthritis    Atrial fibrillation (HCC)    a.) CHA2DS2-VASc = 4 (age, HTN, CVA x2). b.) s/p 200 J synchronized cardioversion (DCCV) on 12/08/2021. c.) rate/rythum maintained on oral metoprolol  tartrate; chronically anticoagulated using rivaroxaban .   BPH (benign prostatic hyperplasia)    DDD (degenerative disc disease), lumbar    Decreased libido    Depression    Diverticulitis    Diverticulosis    GERD (gastroesophageal reflux disease)    Hepatic steatosis    History of 2019 novel coronavirus disease (COVID-19) 12/28/2020   History of cardiac murmur in childhood    History of kidney stones    HLD (hyperlipidemia)    Hypertension    Hypogonadism in male    OSA on CPAP    Pre-diabetes    Rhinitis, allergic    Stroke (HCC) 07/23/2019   a.) LEFT posterior cor radiata. b.) residual RIGHT sided weakness   Past Surgical History:  Procedure Laterality Date   CARDIOVERSION N/A 12/08/2021   Procedure: CARDIOVERSION;  Surgeon: Mady Bruckner, MD;  Location: [facility] ORS;  Service: Cardiovascular;  Laterality: N/A;   COLONOSCOPY     COLONOSCOPY WITH PROPOFOL  N/A 10/06/2023   Procedure: COLONOSCOPY WITH PROPOFOL ;  Surgeon: Therisa Bi, MD;  Location: [facility] ENDOSCOPY;  Service: Gastroenterology;  Laterality: N/A;   EVALUATION UNDER ANESTHESIA WITH HEMORRHOIDECTOMY N/A 04/15/2022   Procedure: EXAM UNDER ANESTHESIA WITH HEMORRHOIDECTOMY;  Surgeon:  Tye Millet, DO;  Location: [facility] ORS;  Service: General;  Laterality: N/A;   EXTERNAL EAR SURGERY     EYE SURGERY     FINGER SURGERY Right    lateration to 5 th digit   KNEE SURGERY     left eye     POLYPECTOMY  10/06/2023   Procedure: POLYPECTOMY;  Surgeon: Therisa Bi, MD;  Location: [facility] ENDOSCOPY;  Service: Gastroenterology;;   RECTAL EXAM UNDER ANESTHESIA N/A 04/23/2022   Procedure: RECTAL EXAM UNDER ANESTHESIA WITH LIGATION OF BLEEDING;  Surgeon: Desiderio Schanz, MD;  Location: [facility] ORS;  Service: General;  Laterality: N/A;   Patient Active Problem List   Diagnosis Date Noted   Chronic venous insufficiency of lower extremity 05/23/2024   History of atrial fibrillation 11/22/2023   Major depression, recurrent, chronic (HCC) 11/22/2023   Adenomatous polyp of colon 10/06/2023   Gastroesophageal reflux disease without esophagitis 05/17/2022   History of syncope 05/17/2022   History of hemorrhoidectomy 05/17/2022   Vitamin D  deficiency 05/17/2022   Lower extremity edema 05/17/2022   History of rectal bleeding 05/17/2022   Morbid obesity (HCC)    Hypertension    Hemiparesis of right dominant side as late effect of cerebral infarction (HCC) 08/13/2019   History of kidney stones 05/02/2018   ED (erectile dysfunction) 05/02/2018   Hyperglycemia 01/30/2017   Arthritis of knee, degenerative 01/30/2017   BPH with obstruction/lower urinary  tract symptoms 02/03/2016   Dyslipidemia 08/04/2015   OSA on CPAP 06/30/2015    PCP: Glenard Mire, MD  REFERRING PROVIDER: Sowles, Krichna, MD  REFERRING DIAG:  [phone] (ICD-10-CM) - Hemiparesis of right dominant side as late effect of cerebral infarction (HCC)  R26.81 (ICD-10-CM) - Gait instability    THERAPY DIAG:  Abnormality of gait and mobility  Unspecified lack of coordination  Other abnormalities of gait and mobility  Difficulty in walking, not elsewhere classified  Muscle weakness (generalized)  Rationale for Evaluation and  Treatment: Rehabilitation  ONSET DATE: chronic  SUBJECTIVE:   SUBJECTIVE STATEMENT: Pt. Reports <5/10 B knee pain walking around PT clinic.  Pt. States he has been walking but limited by knee pain/ endurance.  Pt. Referred back to PT by MD since discharge from PT a few months ago due to regression in LE strength/ endurance.  Pt. Had a R sided stroke on 08/23/2019 and lives with wife and uses [facility] for safety with walking.  Pt. Fearful with walking on varying terrain/ curbs.  No recent falls reported.    PERTINENT HISTORY: Pt. Known well to PT clinic.    PAIN:  Are you having pain? Yes: NPRS scale: 4-5/10  Pain location: B knees Pain description: achy/ persistent Aggravating factors: walking/ step ups Relieving factors: rest  PRECAUTIONS: Fall  RED FLAGS: None   WEIGHT BEARING RESTRICTIONS: No  FALLS:  Has patient fallen in last 6 months? No  LIVING ENVIRONMENT: Lives with: lives with their spouse Lives in: Mobile home Stairs: Yes: External: 5 steps; can reach both Has following equipment at home: Single point cane  OCCUPATION: Retired  PLOF: Independent with household mobility with device  PATIENT GOALS: Increase R sided muscle strength/ walking endurance/ balance.    NEXT MD VISIT: PRN  OBJECTIVE:  Note: Objective measures were completed at Evaluation unless otherwise noted.  PATIENT SURVEYS:  LEFS: 18 out of 80.    COGNITION: Overall cognitive status: Within functional limits for tasks assessed     SENSATION: Light touch: Impaired   EDEMA:  Moderate B lower leg swelling.  Pt. Has compression stockings/ lymphedema pumps.  Marked improvement in lower leg swelling as compared to last PT tx. Session.     MUSCLE LENGTH: Hamstrings: NT Thomas test: Unable to test due to limited supine positioning  POSTURE: rounded shoulders, forward head, and flexed trunk   PALPATION: (+) B knee joint line and lower leg tenderness.  Pitting edema noted in B lower legs  (slightly improved from last PT tx. Session several months ago).    LOWER EXTREMITY ROM:  Active ROM Right eval Left eval  Hip flexion <90 deg. <90 deg.  Hip extension    Hip abduction    Hip adduction    Hip internal rotation    Hip external rotation    Knee flexion    Knee extension    Ankle dorsiflexion    Ankle plantarflexion    Ankle inversion    Ankle eversion     (Blank rows = not tested)  LOWER EXTREMITY MMT:  MMT Right eval Left eval  Hip flexion 4- 4  Hip extension    Hip abduction 4 4+  Hip adduction    Hip internal rotation 4+ 4+  Hip external rotation 4 4  Knee flexion 5 5  Knee extension 5 5  Ankle dorsiflexion    Ankle plantarflexion    Ankle inversion    Ankle eversion     (Blank rows = not tested)  LOWER EXTREMITY SPECIAL TESTS:  NT  FUNCTIONAL TESTS:  5 times sit to stand: 23.44 sec. (1st attempt), 12.67 sec. (2nd attempt).   Berg Balance Scale: TBD  GAIT: Distance walked: in clinic/ outside on sidewalk Assistive device utilized: Single point cane Level of assistance: Modified independence Comments: slow gait pattern with decreased arm swing, heel strike, reciprocal stride length. R foot ER.  No LOB during tx. Session.  Several seated rest breaks (vital WNL)                        test: 408 ft  HR: 83 bpm, O2 sat.: 94% Berg: 43/56                         LEFS:  22 out of 80         TREATMENT DATE: 06/11/2024  Subjective:  Pt. Reports 5/10 pain in B knees walking into PT clinic. Session.  Pt. Entered PT with use of new RW.  Pt. States he can't use RW in home due to spacing but is planning to use with outside/ prolonged walking.     There.ex.:  No charge  Seated B UBE: 4 min. F/B (consistent cadence)- 2 lights.  Warm-up and PT properly sized RW for pt.     There.act.:   Walking around PT clinic with use of RW (applied glides to back of RW).  Pt. Instructed to decrease UE wt. Bearing on RW and use light UE assist when walking in  clinic.    Berg balance test: 45/56 (improvement noted)- limited with tandem/ SLS/ turning 360 deg.   Amb. In //-bars with recip. Cone taps.  Min. To no UE assist in //-bars and cuing to increase hip flexion to prevent from knocking over cones.    Walking in clinic/ waiting room with new RW and improved safety/ gait pattern as compared to [facility].     NOT TODAY: 5# ankle wts.: marching in // bars with 5# ankle weights donned,walking in //-bars forward/backwards 3 laps each.  Mirror feedback for posture correction.  Good step length with no UE assist needed.      Seated LAQ 20x with 5# ankle wts.  STS from gray chair at //-bars with forward posture/ light UE assist after initial stand.    PATIENT EDUCATION:  Education details: Discussed walking program/ home ex. Person educated: Patient Education method: Medical illustrator Education comprehension: verbalized understanding and returned demonstration  HOME EXERCISE PROGRAM: Standing 3-way hip ex./ walking program.  ASSESSMENT:  CLINICAL IMPRESSION: Pt. Works hard during tx. Session but requires several extended seated rest breaks.  Pt. Has shown an slight improvement in Berg balance score and benefits from use of RW with outside/ prolonged walking. Pt. Instructed to stay active at home and walk every hour around home.  Pt. Will continue to benefit from skilled PT services to increase LE muscle strength to improve safety/ independence with daily activity/ walking.    OBJECTIVE IMPAIRMENTS: Abnormal gait, cardiopulmonary status limiting activity, decreased activity tolerance, decreased balance, decreased coordination, decreased endurance, decreased mobility, difficulty walking, decreased ROM, decreased strength, hypomobility, impaired flexibility, impaired sensation, impaired UE functional use, improper body mechanics, postural dysfunction, obesity, and pain.   ACTIVITY LIMITATIONS: carrying, lifting, bending, standing, squatting,  stairs, transfers, bed mobility, bathing, toileting, dressing, hygiene/grooming, and locomotion level  PARTICIPATION LIMITATIONS: meal prep, cleaning, driving, shopping, and community activity  PERSONAL FACTORS: Past/current experiences are also affecting patient's  functional outcome.   REHAB POTENTIAL: Good  CLINICAL DECISION MAKING: Evolving/moderate complexity  EVALUATION COMPLEXITY: Moderate   GOALS: Goals reviewed with patient? Yes  SHORT TERM GOALS: Target date: 06/04/24 Pt. Will increase B hip flexion to >90 deg. In supine position to improve strength/ step length with gait.   Baseline:  see above Goal status: INITIAL  2.  Pt. Will complete 5xSTS in <10 sec. To improve mobility/ decrease fall risk.   Baseline: 5 times sit to stand: 23.44 sec. (1st attempt), 12.67 sec. (2nd attempt).  Goal status: On-going   LONG TERM GOALS: Target date: 07/30/24  Pt. Independent with HEP to increase B LE muscle strength 1/2 muscle grade to improve mobility/ decrease fall risk.   Baseline: see above Goal status: INITIAL  2.  Pt. Will complete Berg balance test and score >48/56 to decrease fall risk/ improve walking with least assistive device.   Baseline:  43/56.  7/8: 45/56 Goal status: Partially met  3.  Pt. Will increase LEFS to >30 out of 80 to improve pain-free functional mobility.   Baseline: 18 out of 80.  7/1: 22 out of 80 Goal status: Not met  4.  Pt. Able to ambulate 15 minutes with use of SPC and consistent step pattern on outside surfaces.   Baseline:  increase B knee pain/ limited endurance Goal status: INITIAL  PLAN:  PT FREQUENCY: 1-2x/week  PT DURATION: 12 weeks  PLANNED INTERVENTIONS: 97750- Physical Performance Testing, 97110-Therapeutic exercises, 97530- Therapeutic activity, 97112- Neuromuscular re-education, 97535- Self Care, 02859- Manual therapy, [phone]- Gait training, [phone]- Electrical stimulation (unattended), Patient/Family education, Balance training,  Stair training, Joint mobilization, Spinal mobilization, Cryotherapy, and Moist heat  PLAN FOR NEXT SESSION: CHECK STG/ hip flexion   Ozell JAYSON Sero, PT, DPT # [phone] 06/11/2024, 3:48 PM

## 2024-06-12 ENCOUNTER — Encounter: Payer: Self-pay | Admitting: Professional

## 2024-06-12 ENCOUNTER — Ambulatory Visit (INDEPENDENT_AMBULATORY_CARE_PROVIDER_SITE_OTHER): Payer: PRIVATE HEALTH INSURANCE | Admitting: Professional

## 2024-06-12 DIAGNOSIS — F411 Generalized anxiety disorder: Secondary | ICD-10-CM | POA: Diagnosis not present

## 2024-06-12 DIAGNOSIS — F320 Major depressive disorder, single episode, mild: Secondary | ICD-10-CM
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
## 2024-06-13 ENCOUNTER — Ambulatory Visit: Admitting: Physical Therapy

## 2024-06-13 ENCOUNTER — Encounter: Payer: Self-pay | Admitting: Physical Therapy

## 2024-06-13 DIAGNOSIS — G8929 Other chronic pain: Secondary | ICD-10-CM

## 2024-06-13 DIAGNOSIS — M25611 Stiffness of right shoulder, not elsewhere classified: Secondary | ICD-10-CM

## 2024-06-13 DIAGNOSIS — R2689 Other abnormalities of gait and mobility: Secondary | ICD-10-CM | POA: Diagnosis not present

## 2024-06-13 DIAGNOSIS — R279 Unspecified lack of coordination: Secondary | ICD-10-CM

## 2024-06-13 DIAGNOSIS — R269 Unspecified abnormalities of gait and mobility: Secondary | ICD-10-CM | POA: Diagnosis not present

## 2024-06-13 DIAGNOSIS — M25661 Stiffness of right knee, not elsewhere classified: Secondary | ICD-10-CM | POA: Diagnosis not present

## 2024-06-13 DIAGNOSIS — M6281 Muscle weakness (generalized): Secondary | ICD-10-CM | POA: Diagnosis not present

## 2024-06-13 DIAGNOSIS — R262 Difficulty in walking, not elsewhere classified: Secondary | ICD-10-CM

## 2024-06-13 DIAGNOSIS — R2681 Unsteadiness on feet: Secondary | ICD-10-CM

## 2024-06-13 NOTE — Therapy (Signed)
 OUTPATIENT PHYSICAL THERAPY LOWER EXTREMITY TREATMENT  Patient Name: MARGARET STAGGS MRN: [id number] DOB:11-12-1951, 73 y.o., male Today's Date: 06/13/2024  END OF SESSION:  PT End of Session - 06/13/24 1438     Visit Number 9    Number of Visits 24    Date for PT Re-Evaluation 07/30/24    PT Start Time 1436    PT Stop Time 1519    PT Time Calculation (min) 43 min    Activity Tolerance Patient tolerated treatment well    Behavior During Therapy WFL for tasks assessed/performed         Past Medical History:  Diagnosis Date   Anxiety    Arthritis    Atrial fibrillation (HCC)    a.) CHA2DS2-VASc = 4 (age, HTN, CVA x2). b.) s/p 200 J synchronized cardioversion (DCCV) on 12/08/2021. c.) rate/rythum maintained on oral metoprolol  tartrate; chronically anticoagulated using rivaroxaban .   BPH (benign prostatic hyperplasia)    DDD (degenerative disc disease), lumbar    Decreased libido    Depression    Diverticulitis    Diverticulosis    GERD (gastroesophageal reflux disease)    Hepatic steatosis    History of 2019 novel coronavirus disease (COVID-19) 12/28/2020   History of cardiac murmur in childhood    History of kidney stones    HLD (hyperlipidemia)    Hypertension    Hypogonadism in male    OSA on CPAP    Pre-diabetes    Rhinitis, allergic    Stroke (HCC) 07/23/2019   a.) LEFT posterior cor radiata. b.) residual RIGHT sided weakness   Past Surgical History:  Procedure Laterality Date   CARDIOVERSION N/A 12/08/2021   Procedure: CARDIOVERSION;  Surgeon: Mady Bruckner, MD;  Location: [facility] ORS;  Service: Cardiovascular;  Laterality: N/A;   COLONOSCOPY     COLONOSCOPY WITH PROPOFOL  N/A 10/06/2023   Procedure: COLONOSCOPY WITH PROPOFOL ;  Surgeon: Therisa Bi, MD;  Location: [facility] ENDOSCOPY;  Service: Gastroenterology;  Laterality: N/A;   EVALUATION UNDER ANESTHESIA WITH HEMORRHOIDECTOMY N/A 04/15/2022   Procedure: EXAM UNDER ANESTHESIA WITH HEMORRHOIDECTOMY;  Surgeon:  Tye Millet, DO;  Location: [facility] ORS;  Service: General;  Laterality: N/A;   EXTERNAL EAR SURGERY     EYE SURGERY     FINGER SURGERY Right    lateration to 5 th digit   KNEE SURGERY     left eye     POLYPECTOMY  10/06/2023   Procedure: POLYPECTOMY;  Surgeon: Therisa Bi, MD;  Location: [facility] ENDOSCOPY;  Service: Gastroenterology;;   RECTAL EXAM UNDER ANESTHESIA N/A 04/23/2022   Procedure: RECTAL EXAM UNDER ANESTHESIA WITH LIGATION OF BLEEDING;  Surgeon: Desiderio Schanz, MD;  Location: [facility] ORS;  Service: General;  Laterality: N/A;   Patient Active Problem List   Diagnosis Date Noted   Chronic venous insufficiency of lower extremity 05/23/2024   History of atrial fibrillation 11/22/2023   Major depression, recurrent, chronic (HCC) 11/22/2023   Adenomatous polyp of colon 10/06/2023   Gastroesophageal reflux disease without esophagitis 05/17/2022   History of syncope 05/17/2022   History of hemorrhoidectomy 05/17/2022   Vitamin D  deficiency 05/17/2022   Lower extremity edema 05/17/2022   History of rectal bleeding 05/17/2022   Morbid obesity (HCC)    Hypertension    Hemiparesis of right dominant side as late effect of cerebral infarction (HCC) 08/13/2019   History of kidney stones 05/02/2018   ED (erectile dysfunction) 05/02/2018   Hyperglycemia 01/30/2017   Arthritis of knee, degenerative 01/30/2017   BPH with obstruction/lower urinary  tract symptoms 02/03/2016   Dyslipidemia 08/04/2015   OSA on CPAP 06/30/2015    PCP: Glenard Mire, MD  REFERRING PROVIDER: Sowles, Krichna, MD  REFERRING DIAG:  [phone] (ICD-10-CM) - Hemiparesis of right dominant side as late effect of cerebral infarction (HCC)  R26.81 (ICD-10-CM) - Gait instability    THERAPY DIAG:  Chronic right shoulder pain  Abnormality of gait and mobility  Unspecified lack of coordination  Decreased range of motion of right shoulder  Other abnormalities of gait and mobility  Difficulty in walking, not  elsewhere classified  Muscle weakness (generalized)  Joint stiffness of both knees  Unsteadiness on feet  Rationale for Evaluation and Treatment: Rehabilitation  ONSET DATE: chronic  SUBJECTIVE:   SUBJECTIVE STATEMENT: Pt. Reports <5/10 B knee pain walking around PT clinic.  Pt. States he has been walking but limited by knee pain/ endurance.  Pt. Referred back to PT by MD since discharge from PT a few months ago due to regression in LE strength/ endurance.  Pt. Had a R sided stroke on 08/23/2019 and lives with wife and uses [facility] for safety with walking.  Pt. Fearful with walking on varying terrain/ curbs.  No recent falls reported.    PERTINENT HISTORY: Pt. Known well to PT clinic.    PAIN:  Are you having pain? Yes: NPRS scale: 4-5/10  Pain location: B knees Pain description: achy/ persistent Aggravating factors: walking/ step ups Relieving factors: rest  PRECAUTIONS: Fall  RED FLAGS: None   WEIGHT BEARING RESTRICTIONS: No  FALLS:  Has patient fallen in last 6 months? No  LIVING ENVIRONMENT: Lives with: lives with their spouse Lives in: Mobile home Stairs: Yes: External: 5 steps; can reach both Has following equipment at home: Single point cane  OCCUPATION: Retired  PLOF: Independent with household mobility with device  PATIENT GOALS: Increase R sided muscle strength/ walking endurance/ balance.    NEXT MD VISIT: PRN  OBJECTIVE:  Note: Objective measures were completed at Evaluation unless otherwise noted.  PATIENT SURVEYS:  LEFS: 18 out of 80.    COGNITION: Overall cognitive status: Within functional limits for tasks assessed     SENSATION: Light touch: Impaired   EDEMA:  Moderate B lower leg swelling.  Pt. Has compression stockings/ lymphedema pumps.  Marked improvement in lower leg swelling as compared to last PT tx. Session.     MUSCLE LENGTH: Hamstrings: NT Thomas test: Unable to test due to limited supine positioning  POSTURE: rounded  shoulders, forward head, and flexed trunk   PALPATION: (+) B knee joint line and lower leg tenderness.  Pitting edema noted in B lower legs (slightly improved from last PT tx. Session several months ago).    LOWER EXTREMITY ROM:  Active ROM Right eval Left eval  Hip flexion <90 deg. <90 deg.  Hip extension    Hip abduction    Hip adduction    Hip internal rotation    Hip external rotation    Knee flexion    Knee extension    Ankle dorsiflexion    Ankle plantarflexion    Ankle inversion    Ankle eversion     (Blank rows = not tested)  LOWER EXTREMITY MMT:  MMT Right eval Left eval  Hip flexion 4- 4  Hip extension    Hip abduction 4 4+  Hip adduction    Hip internal rotation 4+ 4+  Hip external rotation 4 4  Knee flexion 5 5  Knee extension 5 5  Ankle dorsiflexion  Ankle plantarflexion    Ankle inversion    Ankle eversion     (Blank rows = not tested)  LOWER EXTREMITY SPECIAL TESTS:  NT  FUNCTIONAL TESTS:  5 times sit to stand: 23.44 sec. (1st attempt), 12.67 sec. (2nd attempt).   Berg Balance Scale: TBD  GAIT: Distance walked: in clinic/ outside on sidewalk Assistive device utilized: Single point cane Level of assistance: Modified independence Comments: slow gait pattern with decreased arm swing, heel strike, reciprocal stride length. R foot ER.  No LOB during tx. Session.  Several seated rest breaks (vital WNL)                                                     TREATMENT DATE: 06/13/2024  Subjective:  Pt. Reports B knee pain L: 5/10, R:6/10.   There.ex.:   4 min UBE f/b.  2x 1 min throwing 2# medicine ball on rebounder, SBA 2x24 Lat pulldowns on Nautilus with 50# for 1st set, 60# for 2nd 2x24 tricep extensions with 60#  1 min shoulder flexion stretch on the Nautilus   Reviewed HEP/ walking program  NOT TODAY: 5# ankle wts.: marching in // bars with 5# ankle weights donned,walking in //-bars forward/backwards 3 laps each.  Mirror feedback for  posture correction.  Good step length with no UE assist needed.      Seated LAQ 20x with 5# ankle wts.   PATIENT EDUCATION:  Education details: Discussed walking program/ home ex. Person educated: Patient Education method: Medical illustrator Education comprehension: verbalized understanding and returned demonstration  HOME EXERCISE PROGRAM: Standing 3-way hip ex./ walking program.  ASSESSMENT:  CLINICAL IMPRESSION: Pt. Presents to PT with B knee pain and FHRS posture. Tx today focused on postural exercises. Pt demonstrated slight forward flexed posture during rebounder throws, did not experience any LOB. Pt demonstrated SOB during all activities today. Pt would benefit from ongoing PT to address decreased endurance, postural abnormalities, and B knee pain.   OBJECTIVE IMPAIRMENTS: Abnormal gait, cardiopulmonary status limiting activity, decreased activity tolerance, decreased balance, decreased coordination, decreased endurance, decreased mobility, difficulty walking, decreased ROM, decreased strength, hypomobility, impaired flexibility, impaired sensation, impaired UE functional use, improper body mechanics, postural dysfunction, obesity, and pain.   ACTIVITY LIMITATIONS: carrying, lifting, bending, standing, squatting, stairs, transfers, bed mobility, bathing, toileting, dressing, hygiene/grooming, and locomotion level  PARTICIPATION LIMITATIONS: meal prep, cleaning, driving, shopping, and community activity  PERSONAL FACTORS: Past/current experiences are also affecting patient's functional outcome.   REHAB POTENTIAL: Good  CLINICAL DECISION MAKING: Evolving/moderate complexity  EVALUATION COMPLEXITY: Moderate   GOALS: Goals reviewed with patient? Yes  SHORT TERM GOALS: Target date: 06/04/24 Pt. Will increase B hip flexion to >90 deg. In supine position to improve strength/ step length with gait.   Baseline:  see above Goal status: INITIAL  2.  Pt. Will complete  5xSTS in <10 sec. To improve mobility/ decrease fall risk.   Baseline: 5 times sit to stand: 23.44 sec. (1st attempt), 12.67 sec. (2nd attempt).  Goal status: INITIAL   LONG TERM GOALS: Target date: 07/30/24  Pt. Independent with HEP to increase B LE muscle strength 1/2 muscle grade to improve mobility/ decrease fall risk.   Baseline: see above Goal status: INITIAL  2.  Pt. Will complete Berg balance test and score >48/56 to decrease fall risk/ improve walking with  least assistive device.   Baseline:  43/56 Goal status: Partially met  3.  Pt. Will increase LEFS to >30 out of 80 to improve pain-free functional mobility.   Baseline: 18 out of 80 Goal status: INITIAL  4.  Pt. Able to ambulate 15 minutes with use of SPC and consistent step pattern on outside surfaces.   Baseline:  increase B knee pain/ limited endurance Goal status: INITIAL  PLAN:  PT FREQUENCY: 1-2x/week  PT DURATION: 12 weeks  PLANNED INTERVENTIONS: 97750- Physical Performance Testing, 97110-Therapeutic exercises, 97530- Therapeutic activity, 97112- Neuromuscular re-education, 97535- Self Care, [id number]- Manual therapy, [phone]- Gait training, [phone]- Electrical stimulation (unattended), Patient/Family education, Balance training, Stair training, Joint mobilization, Spinal mobilization, Cryotherapy, and Moist heat  PLAN FOR NEXT SESSION: 10th visit progress note/ check STGs (hip flexion/ 5xSTS).     Ozell JAYSON Sero, PT, DPT # [id number] Josealberto Montalto, SPT 06/13/2024, 4:17 PM

## 2024-06-18 ENCOUNTER — Ambulatory Visit

## 2024-06-18 DIAGNOSIS — M25661 Stiffness of right knee, not elsewhere classified: Secondary | ICD-10-CM | POA: Diagnosis not present

## 2024-06-18 DIAGNOSIS — R262 Difficulty in walking, not elsewhere classified: Secondary | ICD-10-CM | POA: Diagnosis not present

## 2024-06-18 DIAGNOSIS — M6281 Muscle weakness (generalized): Secondary | ICD-10-CM

## 2024-06-18 DIAGNOSIS — R279 Unspecified lack of coordination: Secondary | ICD-10-CM | POA: Diagnosis not present

## 2024-06-18 DIAGNOSIS — R2681 Unsteadiness on feet: Secondary | ICD-10-CM

## 2024-06-18 DIAGNOSIS — R269 Unspecified abnormalities of gait and mobility: Secondary | ICD-10-CM | POA: Diagnosis not present

## 2024-06-18 DIAGNOSIS — R2689 Other abnormalities of gait and mobility: Secondary | ICD-10-CM | POA: Diagnosis not present

## 2024-06-18 NOTE — Therapy (Signed)
 OUTPATIENT PHYSICAL THERAPY LOWER EXTREMITY  Physical Therapy Progress Note   Dates of reporting period  05/07/24   to   06/18/24  Patient Name: Tom Underwood MRN: [id number] DOB:03/29/1951, 73 y.o., male Today's Date: 06/18/2024  END OF SESSION:  PT End of Session - 06/18/24 1513     Visit Number 10    Number of Visits 24    Date for PT Re-Evaluation 07/30/24    PT Start Time 1420    PT Stop Time 1500    PT Time Calculation (min) 40 min    Activity Tolerance Patient tolerated treatment well    Behavior During Therapy WFL for tasks assessed/performed          Past Medical History:  Diagnosis Date   Anxiety    Arthritis    Atrial fibrillation (HCC)    a.) CHA2DS2-VASc = 4 (age, HTN, CVA x2). b.) s/p 200 J synchronized cardioversion (DCCV) on 12/08/2021. c.) rate/rythum maintained on oral metoprolol  tartrate; chronically anticoagulated using rivaroxaban .   BPH (benign prostatic hyperplasia)    DDD (degenerative disc disease), lumbar    Decreased libido    Depression    Diverticulitis    Diverticulosis    GERD (gastroesophageal reflux disease)    Hepatic steatosis    History of 2019 novel coronavirus disease (COVID-19) 12/28/2020   History of cardiac murmur in childhood    History of kidney stones    HLD (hyperlipidemia)    Hypertension    Hypogonadism in male    OSA on CPAP    Pre-diabetes    Rhinitis, allergic    Stroke (HCC) 07/23/2019   a.) LEFT posterior cor radiata. b.) residual RIGHT sided weakness   Past Surgical History:  Procedure Laterality Date   CARDIOVERSION N/A 12/08/2021   Procedure: CARDIOVERSION;  Surgeon: Mady Bruckner, MD;  Location: [facility] ORS;  Service: Cardiovascular;  Laterality: N/A;   COLONOSCOPY     COLONOSCOPY WITH PROPOFOL  N/A 10/06/2023   Procedure: COLONOSCOPY WITH PROPOFOL ;  Surgeon: Therisa Bi, MD;  Location: [facility] ENDOSCOPY;  Service: Gastroenterology;  Laterality: N/A;   EVALUATION UNDER ANESTHESIA WITH HEMORRHOIDECTOMY N/A  04/15/2022   Procedure: EXAM UNDER ANESTHESIA WITH HEMORRHOIDECTOMY;  Surgeon: Tye Millet, DO;  Location: [facility] ORS;  Service: General;  Laterality: N/A;   EXTERNAL EAR SURGERY     EYE SURGERY     FINGER SURGERY Right    lateration to 5 th digit   KNEE SURGERY     left eye     POLYPECTOMY  10/06/2023   Procedure: POLYPECTOMY;  Surgeon: Therisa Bi, MD;  Location: [facility] ENDOSCOPY;  Service: Gastroenterology;;   RECTAL EXAM UNDER ANESTHESIA N/A 04/23/2022   Procedure: RECTAL EXAM UNDER ANESTHESIA WITH LIGATION OF BLEEDING;  Surgeon: Desiderio Schanz, MD;  Location: [facility] ORS;  Service: General;  Laterality: N/A;   Patient Active Problem List   Diagnosis Date Noted   Chronic venous insufficiency of lower extremity 05/23/2024   History of atrial fibrillation 11/22/2023   Major depression, recurrent, chronic (HCC) 11/22/2023   Adenomatous polyp of colon 10/06/2023   Gastroesophageal reflux disease without esophagitis 05/17/2022   History of syncope 05/17/2022   History of hemorrhoidectomy 05/17/2022   Vitamin D  deficiency 05/17/2022   Lower extremity edema 05/17/2022   History of rectal bleeding 05/17/2022   Morbid obesity (HCC)    Hypertension    Hemiparesis of right dominant side as late effect of cerebral infarction (HCC) 08/13/2019   History of kidney stones 05/02/2018   ED (erectile  dysfunction) 05/02/2018   Hyperglycemia 01/30/2017   Arthritis of knee, degenerative 01/30/2017   BPH with obstruction/lower urinary tract symptoms 02/03/2016   Dyslipidemia 08/04/2015   OSA on CPAP 06/30/2015    PCP: Glenard Mire, MD  REFERRING PROVIDER: Sowles, Krichna, MD  REFERRING DIAG:  [phone] (ICD-10-CM) - Hemiparesis of right dominant side as late effect of cerebral infarction (HCC)  R26.81 (ICD-10-CM) - Gait instability    THERAPY DIAG:  Abnormality of gait and mobility  Muscle weakness (generalized)  Joint stiffness of both knees  Unsteadiness on feet  Unspecified lack of  coordination  Other abnormalities of gait and mobility  Difficulty in walking, not elsewhere classified  Rationale for Evaluation and Treatment: Rehabilitation  ONSET DATE: chronic  SUBJECTIVE:   SUBJECTIVE STATEMENT: Pt. Reports <5/10 B knee pain walking around PT clinic.  Pt. States he has been walking but limited by knee pain/ endurance.  Pt. Referred back to PT by MD since discharge from PT a few months ago due to regression in LE strength/ endurance.  Pt. Had a R sided stroke on 08/23/2019 and lives with wife and uses [facility] for safety with walking.  Pt. Fearful with walking on varying terrain/ curbs.  No recent falls reported.    PERTINENT HISTORY: Pt. Known well to PT clinic.    PAIN:  Are you having pain? Yes: NPRS scale: 4-5/10  Pain location: B knees Pain description: achy/ persistent Aggravating factors: walking/ step ups Relieving factors: rest  PRECAUTIONS: Fall  RED FLAGS: None   WEIGHT BEARING RESTRICTIONS: No  FALLS:  Has patient fallen in last 6 months? No  LIVING ENVIRONMENT: Lives with: lives with their spouse Lives in: Mobile home Stairs: Yes: External: 5 steps; can reach both Has following equipment at home: Single point cane  OCCUPATION: Retired  PLOF: Independent with household mobility with device  PATIENT GOALS: Increase R sided muscle strength/ walking endurance/ balance.    NEXT MD VISIT: PRN  OBJECTIVE:  Note: Objective measures were completed at Evaluation unless otherwise noted.  PATIENT SURVEYS:  LEFS: 18 out of 80.    COGNITION: Overall cognitive status: Within functional limits for tasks assessed     SENSATION: Light touch: Impaired   EDEMA:  Moderate B lower leg swelling.  Pt. Has compression stockings/ lymphedema pumps.  Marked improvement in lower leg swelling as compared to last PT tx. Session.     MUSCLE LENGTH: Hamstrings: NT Thomas test: Unable to test due to limited supine positioning  POSTURE: rounded shoulders,  forward head, and flexed trunk   PALPATION: (+) B knee joint line and lower leg tenderness.  Pitting edema noted in B lower legs (slightly improved from last PT tx. Session several months ago).    LOWER EXTREMITY ROM:  Active ROM Right eval Left eval  Hip flexion <90 deg. <90 deg.  Hip extension    Hip abduction    Hip adduction    Hip internal rotation    Hip external rotation    Knee flexion    Knee extension    Ankle dorsiflexion    Ankle plantarflexion    Ankle inversion    Ankle eversion     (Blank rows = not tested)  LOWER EXTREMITY MMT:  MMT Right eval Left eval  Hip flexion 4- 4  Hip extension    Hip abduction 4 4+  Hip adduction    Hip internal rotation 4+ 4+  Hip external rotation 4 4  Knee flexion 5 5  Knee extension  5 5  Ankle dorsiflexion    Ankle plantarflexion    Ankle inversion    Ankle eversion     (Blank rows = not tested)  LOWER EXTREMITY SPECIAL TESTS:  NT  FUNCTIONAL TESTS:  5 times sit to stand: 23.44 sec. (1st attempt), 12.67 sec. (2nd attempt).   Berg Balance Scale: TBD  GAIT: Distance walked: in clinic/ outside on sidewalk Assistive device utilized: Single point cane Level of assistance: Modified independence Comments: slow gait pattern with decreased arm swing, heel strike, reciprocal stride length. R foot ER.  No LOB during tx. Session.  Several seated rest breaks (vital WNL)                                                     TREATMENT DATE: 06/18/2024  Subjective:  Pt. Reports 4/10 NPS for the L knee, 6/10 NPS on the R knee today.  Pt arrived to PT with a 2-wheeled walker and compression socks.   There.act.:   LEFS: 14/80  Berg: 42/56 5xSTS: 16.03 sec   LOWER EXTREMITY MMT:  MMT Right eval Left eval  Hip flexion 4+ 4+  Hip extension    Hip abduction 5 5  Hip adduction    Hip internal rotation 4+ 4+  Hip external rotation 4 4+  Knee flexion 5 5  Knee extension 5 5  Ankle dorsiflexion    Ankle plantarflexion     Ankle inversion    Ankle eversion     (Blank rows = not tested)  Hip flexion ROM (measured in standing): R:83 deg, L: 93 deg   NOT TODAY: 5# ankle wts.: marching in // bars with 5# ankle weights donned,walking in //-bars forward/backwards 3 laps each.  Mirror feedback for posture correction.  Good step length with no UE assist needed.      Seated LAQ 20x with 5# ankle wts.   PATIENT EDUCATION:  Education details: Discussed walking program/ home ex. Person educated: Patient Education method: Medical illustrator Education comprehension: verbalized understanding and returned demonstration  HOME EXERCISE PROGRAM: Standing 3-way hip ex./ walking program.  ASSESSMENT:  CLINICAL IMPRESSION: Pt. Presents to PT with B knee pain and FHRS posture. Tx today focused on re-assessing goals. Pt scored 42/56 on the Berg, similar to his score last month (43/56), indicating continued impaired balance. Pt scored a 14/80 on the LEFS, which indicates a low level of function in BLEs. Despite this, pt increased or maintained his BLE strength from eval. Pt was unable to comfortably lie on his back to measure hip flexion ROM, so hip flexion was measured in standing. Pt demonstrated improved B hip flexion, meeting his goal on the L, and coming 7 degrees short on the R. Pt demonstrated SOB during all activities today. Pt would benefit from ongoing PT to address decreased endurance, postural abnormalities, and B knee pain.   OBJECTIVE IMPAIRMENTS: Abnormal gait, cardiopulmonary status limiting activity, decreased activity tolerance, decreased balance, decreased coordination, decreased endurance, decreased mobility, difficulty walking, decreased ROM, decreased strength, hypomobility, impaired flexibility, impaired sensation, impaired UE functional use, improper body mechanics, postural dysfunction, obesity, and pain.   ACTIVITY LIMITATIONS: carrying, lifting, bending, standing, squatting, stairs,  transfers, bed mobility, bathing, toileting, dressing, hygiene/grooming, and locomotion level  PARTICIPATION LIMITATIONS: meal prep, cleaning, driving, shopping, and community activity  PERSONAL FACTORS: Past/current experiences are also affecting patient's  functional outcome.   REHAB POTENTIAL: Good  CLINICAL DECISION MAKING: Evolving/moderate complexity  EVALUATION COMPLEXITY: Moderate   GOALS: Goals reviewed with patient? Yes  SHORT TERM GOALS: Target date: 06/04/24 Pt. Will increase B hip flexion to >90 deg. In supine position to improve strength/ step length with gait.   Baseline:  see above.  7/15: to be assessed next visit, pt unable to attain supine position  Goal status: INITIAL  2.  Pt. Will complete 5xSTS in <10 sec. To improve mobility/ decrease fall risk.   Baseline: 5 times sit to stand: 23.44 sec. (1st attempt), 12.67 sec. (2nd attempt). 7/15: 16.03 sec  Goal status: In progress    LONG TERM GOALS: Target date: 07/30/24  Pt. Independent with HEP to increase B LE muscle strength 1/2 muscle grade to improve mobility/ decrease fall risk.   Baseline: see above. 7/15: strength maintained or improved  Goal status: in progress   2.  Pt. Will complete Berg balance test and score >48/56 to decrease fall risk/ improve walking with least assistive device.   Baseline:  43/56,  7/15: 42/56 Goal status: In progress   3.  Pt. Will increase LEFS to >30 out of 80 to improve pain-free functional mobility.   Baseline: 18 out of 80. 7/15: 14/80  Goal status: Not met   4.  Pt. Able to ambulate 15 minutes with use of SPC and consistent step pattern on outside surfaces.   Baseline:  increase B knee pain/ limited endurance. 7/15: not able to test due to weather  Goal status: INITIAL  PLAN:  PT FREQUENCY: 1-2x/week  PT DURATION: 12 weeks  PLANNED INTERVENTIONS: 97750- Physical Performance Testing, 97110-Therapeutic exercises, 97530- Therapeutic activity, 97112- Neuromuscular  re-education, 97535- Self Care, 02859- Manual therapy, [phone]- Gait training, [phone]- Electrical stimulation (unattended), Patient/Family education, Balance training, Stair training, Joint mobilization, Spinal mobilization, Cryotherapy, and Moist heat  PLAN FOR NEXT SESSION: Measure supine hip flexion, progress balance and postural exercises     Saori Umholtz, SPT  Maryanne Finder, PT, DPT Physical Therapist - [facility]  [facility] 06/18/2024, 3:14 PM

## 2024-06-19 ENCOUNTER — Ambulatory Visit: Payer: PRIVATE HEALTH INSURANCE | Admitting: Professional

## 2024-06-20 ENCOUNTER — Encounter: Admitting: Physical Therapy

## 2024-06-27 ENCOUNTER — Ambulatory Visit: Payer: Self-pay

## 2024-06-27 DIAGNOSIS — R1030 Lower abdominal pain, unspecified: Secondary | ICD-10-CM | POA: Diagnosis not present

## 2024-06-27 DIAGNOSIS — Z87442 Personal history of urinary calculi: Secondary | ICD-10-CM | POA: Diagnosis not present

## 2024-06-27 NOTE — Telephone Encounter (Signed)
 FYI Only or Action Required?: FYI only for provider.  Patient was last seen in primary care on 05/23/2024 by Glenard Mire, MD.  Called Nurse Triage reporting Abdominal Pain.  Symptoms began today.  Interventions attempted: Nothing.  Symptoms are: gradually worsening.  Triage Disposition: See HCP Within 4 Hours (Or PCP Triage)- No appts avail, RN advising UC. Pt agreeable.   Patient/caregiver understands and will follow disposition?: Yes Copied from CRM #[id number]. Topic: Clinical - Red Word Triage >> Jun 27, 2024  4:23 PM Deleta RAMAN wrote: Red Word that prompted transfer to Nurse Triage: patient wife is calling due to husband experiencing left side pain believe it's kidney stones Reason for Disposition  [1] MILD-MODERATE pain AND [2] constant AND [3] age > 60 years  Answer Assessment - Initial Assessment Questions 1. LOCATION: Where does it hurt?      RLQ  2. RADIATION: Does the pain shoot anywhere else? (e.g., chest, back)     No  3. ONSET: When did the pain begin? (Minutes, hours or days ago)      Started 10 min ago  4. SUDDEN: Gradual or sudden onset?     Sudden onset  5. PATTERN Does the pain come and go, or is it constant?     Constant  6. SEVERITY: How bad is the pain?  (e.g., Scale 1-10; mild, moderate, or severe)     6/10 pain  7. RECURRENT SYMPTOM: Have you ever had this type of stomach pain before? If Yes, ask: When was the last time? and What happened that time?      Yes, when he had a kidney stones  8. CAUSE: What do you think is causing the stomach pain? (e.g., gallstones, recent abdominal surgery)     Thinks it may be a kidney stone  9. RELIEVING/AGGRAVATING FACTORS: What makes it better or worse? (e.g., antacids, bending or twisting motion, bowel movement)     Got worse when standing straight up after urinating  10. OTHER SYMPTOMS: Do you have any other symptoms? (e.g., back pain, diarrhea, fever, urination pain, vomiting)        No  Protocols used: Abdominal Pain - Male-A-AH

## 2024-06-28 ENCOUNTER — Telehealth: Payer: Self-pay

## 2024-06-28 NOTE — Telephone Encounter (Signed)
 Patient was seen at [facility] urgent care for lower abd pain. Provider performed a urine dip stick and prescribed antibiotics. I was able to schedule a f/u visit with Tom Underwood on 8/21. Patient states no pain  and now and feels generally well. Advised patient to complete antibiotics as prescribed and to push fluids and that he could take tylenol  to help manage any pain. Also stated that if his symptoms worsen he needs to go to the ER for treatment.

## 2024-07-02 ENCOUNTER — Ambulatory Visit: Admitting: Physical Therapy

## 2024-07-02 ENCOUNTER — Encounter: Payer: Self-pay | Admitting: Physical Therapy

## 2024-07-02 DIAGNOSIS — M25661 Stiffness of right knee, not elsewhere classified: Secondary | ICD-10-CM

## 2024-07-02 DIAGNOSIS — M6281 Muscle weakness (generalized): Secondary | ICD-10-CM | POA: Diagnosis not present

## 2024-07-02 DIAGNOSIS — R269 Unspecified abnormalities of gait and mobility: Secondary | ICD-10-CM | POA: Diagnosis not present

## 2024-07-02 DIAGNOSIS — R262 Difficulty in walking, not elsewhere classified: Secondary | ICD-10-CM | POA: Diagnosis not present

## 2024-07-02 DIAGNOSIS — R2681 Unsteadiness on feet: Secondary | ICD-10-CM

## 2024-07-02 DIAGNOSIS — R2689 Other abnormalities of gait and mobility: Secondary | ICD-10-CM

## 2024-07-02 DIAGNOSIS — R279 Unspecified lack of coordination: Secondary | ICD-10-CM

## 2024-07-02 NOTE — Therapy (Signed)
 OUTPATIENT PHYSICAL THERAPY LOWER EXTREMITY   Patient Name: Tom Underwood MRN: [id number] DOB:22-Nov-1951, 73 y.o., male Today's Date: 07/02/2024  END OF SESSION:  PT End of Session - 07/02/24 1134     Visit Number 11    Number of Visits 24    Date for PT Re-Evaluation 07/30/24    PT Start Time 1114    PT Stop Time 1211    PT Time Calculation (min) 57 min    Activity Tolerance Patient tolerated treatment well    Behavior During Therapy WFL for tasks assessed/performed         Past Medical History:  Diagnosis Date   Anxiety    Arthritis    Atrial fibrillation (HCC)    a.) CHA2DS2-VASc = 4 (age, HTN, CVA x2). b.) s/p 200 J synchronized cardioversion (DCCV) on 12/08/2021. c.) rate/rythum maintained on oral metoprolol  tartrate; chronically anticoagulated using rivaroxaban .   BPH (benign prostatic hyperplasia)    DDD (degenerative disc disease), lumbar    Decreased libido    Depression    Diverticulitis    Diverticulosis    GERD (gastroesophageal reflux disease)    Hepatic steatosis    History of 2019 novel coronavirus disease (COVID-19) 12/28/2020   History of cardiac murmur in childhood    History of kidney stones    HLD (hyperlipidemia)    Hypertension    Hypogonadism in male    OSA on CPAP    Pre-diabetes    Rhinitis, allergic    Stroke (HCC) 07/23/2019   a.) LEFT posterior cor radiata. b.) residual RIGHT sided weakness   Past Surgical History:  Procedure Laterality Date   CARDIOVERSION N/A 12/08/2021   Procedure: CARDIOVERSION;  Surgeon: Mady Bruckner, MD;  Location: [facility] ORS;  Service: Cardiovascular;  Laterality: N/A;   COLONOSCOPY     COLONOSCOPY WITH PROPOFOL  N/A 10/06/2023   Procedure: COLONOSCOPY WITH PROPOFOL ;  Surgeon: Therisa Bi, MD;  Location: [facility] ENDOSCOPY;  Service: Gastroenterology;  Laterality: N/A;   EVALUATION UNDER ANESTHESIA WITH HEMORRHOIDECTOMY N/A 04/15/2022   Procedure: EXAM UNDER ANESTHESIA WITH HEMORRHOIDECTOMY;  Surgeon: Tye Millet, DO;  Location: [facility] ORS;  Service: General;  Laterality: N/A;   EXTERNAL EAR SURGERY     EYE SURGERY     FINGER SURGERY Right    lateration to 5 th digit   KNEE SURGERY     left eye     POLYPECTOMY  10/06/2023   Procedure: POLYPECTOMY;  Surgeon: Therisa Bi, MD;  Location: [facility] ENDOSCOPY;  Service: Gastroenterology;;   RECTAL EXAM UNDER ANESTHESIA N/A 04/23/2022   Procedure: RECTAL EXAM UNDER ANESTHESIA WITH LIGATION OF BLEEDING;  Surgeon: Desiderio Schanz, MD;  Location: [facility] ORS;  Service: General;  Laterality: N/A;   Patient Active Problem List   Diagnosis Date Noted   Chronic venous insufficiency of lower extremity 05/23/2024   History of atrial fibrillation 11/22/2023   Major depression, recurrent, chronic (HCC) 11/22/2023   Adenomatous polyp of colon 10/06/2023   Gastroesophageal reflux disease without esophagitis 05/17/2022   History of syncope 05/17/2022   History of hemorrhoidectomy 05/17/2022   Vitamin D  deficiency 05/17/2022   Lower extremity edema 05/17/2022   History of rectal bleeding 05/17/2022   Morbid obesity (HCC)    Hypertension    Hemiparesis of right dominant side as late effect of cerebral infarction (HCC) 08/13/2019   History of kidney stones 05/02/2018   ED (erectile dysfunction) 05/02/2018   Hyperglycemia 01/30/2017   Arthritis of knee, degenerative 01/30/2017   BPH with obstruction/lower urinary  tract symptoms 02/03/2016   Dyslipidemia 08/04/2015   OSA on CPAP 06/30/2015    PCP: Glenard Mire, MD  REFERRING PROVIDER: Sowles, Krichna, MD  REFERRING DIAG:  [phone] (ICD-10-CM) - Hemiparesis of right dominant side as late effect of cerebral infarction (HCC)  R26.81 (ICD-10-CM) - Gait instability    THERAPY DIAG:  Abnormality of gait and mobility  Muscle weakness (generalized)  Joint stiffness of both knees  Unsteadiness on feet  Unspecified lack of coordination  Other abnormalities of gait and mobility  Rationale for Evaluation and  Treatment: Rehabilitation  ONSET DATE: chronic  SUBJECTIVE:   SUBJECTIVE STATEMENT: Pt. Reports <5/10 B knee pain walking around PT clinic.  Pt. States he has been walking but limited by knee pain/ endurance.  Pt. Referred back to PT by MD since discharge from PT a few months ago due to regression in LE strength/ endurance.  Pt. Had a R sided stroke on 08/23/2019 and lives with wife and uses [facility] for safety with walking.  Pt. Fearful with walking on varying terrain/ curbs.  No recent falls reported.    PERTINENT HISTORY: Pt. Known well to PT clinic.    PAIN:  Are you having pain? Yes: NPRS scale: 4-5/10  Pain location: B knees Pain description: achy/ persistent Aggravating factors: walking/ step ups Relieving factors: rest  PRECAUTIONS: Fall  RED FLAGS: None   WEIGHT BEARING RESTRICTIONS: No  FALLS:  Has patient fallen in last 6 months? No  LIVING ENVIRONMENT: Lives with: lives with their spouse Lives in: Mobile home Stairs: Yes: External: 5 steps; can reach both Has following equipment at home: Single point cane  OCCUPATION: Retired  PLOF: Independent with household mobility with device  PATIENT GOALS: Increase R sided muscle strength/ walking endurance/ balance.    NEXT MD VISIT: PRN  OBJECTIVE:  Note: Objective measures were completed at Evaluation unless otherwise noted.  PATIENT SURVEYS:  LEFS: 18 out of 80.    COGNITION: Overall cognitive status: Within functional limits for tasks assessed     SENSATION: Light touch: Impaired   EDEMA:  Moderate B lower leg swelling.  Pt. Has compression stockings/ lymphedema pumps.  Marked improvement in lower leg swelling as compared to last PT tx. Session.     MUSCLE LENGTH: Hamstrings: NT Thomas test: Unable to test due to limited supine positioning  POSTURE: rounded shoulders, forward head, and flexed trunk   PALPATION: (+) B knee joint line and lower leg tenderness.  Pitting edema noted in B lower legs  (slightly improved from last PT tx. Session several months ago).    LOWER EXTREMITY ROM:  Active ROM Right eval Left eval  Hip flexion <90 deg. <90 deg.  Hip extension    Hip abduction    Hip adduction    Hip internal rotation    Hip external rotation    Knee flexion    Knee extension    Ankle dorsiflexion    Ankle plantarflexion    Ankle inversion    Ankle eversion     (Blank rows = not tested)  LOWER EXTREMITY MMT:  MMT Right eval Left eval  Hip flexion 4- 4  Hip extension    Hip abduction 4 4+  Hip adduction    Hip internal rotation 4+ 4+  Hip external rotation 4 4  Knee flexion 5 5  Knee extension 5 5  Ankle dorsiflexion    Ankle plantarflexion    Ankle inversion    Ankle eversion     (Blank rows =  not tested)  LOWER EXTREMITY SPECIAL TESTS:  NT  FUNCTIONAL TESTS:  5 times sit to stand: 23.44 sec. (1st attempt), 12.67 sec. (2nd attempt).   Berg Balance Scale: TBD  GAIT: Distance walked: in clinic/ outside on sidewalk Assistive device utilized: Single point cane Level of assistance: Modified independence Comments: slow gait pattern with decreased arm swing, heel strike, reciprocal stride length. R foot ER.  No LOB during tx. Session.  Several seated rest breaks (vital WNL)  LEFS: 14/80  Berg: 42/56 5xSTS: 16.03 sec   MMT Right eval Left eval  Hip flexion 4+ 4+  Hip extension    Hip abduction 5 5  Hip adduction    Hip internal rotation 4+ 4+  Hip external rotation 4 4+  Knee flexion 5 5  Knee extension 5 5  Ankle dorsiflexion    Ankle plantarflexion    Ankle inversion    Ankle eversion     (Blank rows = not tested)                                                     TREATMENT DATE: 07/02/2024  Subjective:  Pt. Reports 4/10 NPS for the L knee, 6/10 NPS on the R knee today.  Pt arrived to PT with a 2-wheeled walker and compression socks.  [place] reports some daily walking but limited with increase distances.   No falls or LOB reported.    There.act.:   Seated UBE: 3 min. F/b (maintaining 2 lights)  Waking in //-bars with increase step length/ hip flexion (3 laps)- forward and sideways.  Partial squats in //-bars with light to no UE assist 10x2.    5# seated LAQ/ marching/ standing marching/ standing hip abduction 20x (marked fatigue)- rest break required/ increase SOB.    Walking in clinic with use of RW (2 laps in gym).    Nautilus: seated lat. Pull downs with wand (60#)- 20x2.    Discussed importance of daily walking/ activity/ HEP  PATIENT EDUCATION:  Education details: Discussed walking program/ home ex. Person educated: Patient Education method: Medical illustrator Education comprehension: verbalized understanding and returned demonstration  HOME EXERCISE PROGRAM: Standing 3-way hip ex./ walking program.  ASSESSMENT:  CLINICAL IMPRESSION: Pt. Remains limited with standing tolerance/ walking in clinic secondary to marked fatigue/ B knee pain. Pt. demonstrated SOB during all activities today and requires several seated rest breaks.  No LOB during tx. And pt. Benefits from cuing to correct posture/ technique with LE strengthening ex.   Pt would benefit from ongoing PT to address decreased endurance, postural abnormalities, and B knee pain.   OBJECTIVE IMPAIRMENTS: Abnormal gait, cardiopulmonary status limiting activity, decreased activity tolerance, decreased balance, decreased coordination, decreased endurance, decreased mobility, difficulty walking, decreased ROM, decreased strength, hypomobility, impaired flexibility, impaired sensation, impaired UE functional use, improper body mechanics, postural dysfunction, obesity, and pain.   ACTIVITY LIMITATIONS: carrying, lifting, bending, standing, squatting, stairs, transfers, bed mobility, bathing, toileting, dressing, hygiene/grooming, and locomotion level  PARTICIPATION LIMITATIONS: meal prep, cleaning, driving, shopping, and  community activity  PERSONAL FACTORS: Past/current experiences are also affecting patient's functional outcome.   REHAB POTENTIAL: Good  CLINICAL DECISION MAKING: Evolving/moderate complexity  EVALUATION COMPLEXITY: Moderate   GOALS: Goals reviewed with patient? Yes  SHORT TERM GOALS: Target date: 06/04/24 Pt. Will increase  B hip flexion to >90 deg. In supine position to improve strength/ step length with gait.   Baseline:  see above.  7/15: to be assessed next visit, pt unable to attain supine position  Goal status: INITIAL  2.  Pt. Will complete 5xSTS in <10 sec. To improve mobility/ decrease fall risk.   Baseline: 5 times sit to stand: 23.44 sec. (1st attempt), 12.67 sec. (2nd attempt). 7/15: 16.03 sec  Goal status: In progress    LONG TERM GOALS: Target date: 07/30/24  Pt. Independent with HEP to increase B LE muscle strength 1/2 muscle grade to improve mobility/ decrease fall risk.   Baseline: see above. 7/15: strength maintained or improved  Goal status: in progress   2.  Pt. Will complete Berg balance test and score >48/56 to decrease fall risk/ improve walking with least assistive device.   Baseline:  43/56,  7/15: 42/56 Goal status: In progress   3.  Pt. Will increase LEFS to >30 out of 80 to improve pain-free functional mobility.   Baseline: 18 out of 80. 7/15: 14/80  Goal status: Not met   4.  Pt. Able to ambulate 15 minutes with use of SPC and consistent step pattern on outside surfaces.   Baseline:  increase B knee pain/ limited endurance. 7/15: not able to test due to weather  Goal status: INITIAL  PLAN:  PT FREQUENCY: 1-2x/week  PT DURATION: 12 weeks  PLANNED INTERVENTIONS: 97750- Physical Performance Testing, 97110-Therapeutic exercises, 97530- Therapeutic activity, 97112- Neuromuscular re-education, 97535- Self Care, [id number]- Manual therapy, [phone]- Gait training, [phone]- Electrical stimulation (unattended), Patient/Family education, Balance training, Stair  training, Joint mobilization, Spinal mobilization, Cryotherapy, and Moist heat  PLAN FOR NEXT SESSION: Progress balance and postural exercises    Ozell JAYSON Sero, PT, DPT # [id number] Physical Therapist - [facility] 07/02/2024, 12:44 PM

## 2024-07-03 ENCOUNTER — Ambulatory Visit: Payer: PRIVATE HEALTH INSURANCE | Admitting: Professional

## 2024-07-03 ENCOUNTER — Ambulatory Visit (INDEPENDENT_AMBULATORY_CARE_PROVIDER_SITE_OTHER): Payer: PRIVATE HEALTH INSURANCE | Admitting: Professional

## 2024-07-03 ENCOUNTER — Encounter: Payer: Self-pay | Admitting: Professional

## 2024-07-03 DIAGNOSIS — F320 Major depressive disorder, single episode, mild: Secondary | ICD-10-CM | POA: Diagnosis not present

## 2024-07-03 DIAGNOSIS — F411 Generalized anxiety disorder: Secondary | ICD-10-CM | POA: Diagnosis not present

## 2024-07-03 NOTE — Progress Notes (Signed)
 [facility] Counselor/Therapist Progress Note  Patient ID: Tom Underwood, MRN: [id number],    Date: 07/03/2024  Time Spent: 49 minutes 1006-1055am   Treatment Type: Individual Therapy  Risk Assessment: Danger to Self:  No Self-injurious Behavior: No Danger to Others: No Duty to Warn:no  Subjective: This session was held via video teletherapy. The patient consented to video teletherapy and was located at his home during this session. He is aware it is the responsibility of the patient to secure confidentiality on her end of the session. The provider was in a private home office for the duration of this session.    The patient arrived late for his Caregility appointment.   Issues addressed: 1-mood -pleasant and easily engaged 2-PT -going well, using walker 3-vacation -Arland drove to [place] and he took over from there to [employer] and did same thing on way home from vacation -they had an enjoyable vacation -the cat went with them and was anxious most of the week 4-driving -desensitization of driving on interstate -[address] exit at [employer] -drive rural roads to [employer], get on and travel one exit to [employer], celebrate the accomplishment and then get on interstate at [facility], go back to [employer] and drive home the [place] roads -pt said he thinks he can and then after some thought said I think it's too much -pt said he thinks he would prefer to get on an exit near where he lives and get off next exit [place] to [facility])  Treatment Plan Problems: Anxiety, Unipolar Depression, Posttraumatic Stress Disorder (PTSD), Grief / Loss Unresolved,  Symptoms: Excessive and/or unrealistic worry that is difficult to control occurring more days than not for at least 6 months about a number of events or activities. Motor tension (e.g., restlessness, tiredness, shakiness, muscle tension). Hypervigilance (e.g., feeling constantly on edge,  experiencing concentration difficulties, having trouble falling or staying asleep, exhibiting a general state of irritability). Thoughts dominated by loss coupled with poor concentration, tearful spells, and confusion about the future. Serial losses in life (i.e., deaths, divorces, jobs) that led to depression and discouragement. Strong emotional response of sadness exhibited when losses are discussed. Lack of appetite, weight loss, and/or insomnia as well as other depression signs that occurred since the loss. Has been exposed to a traumatic event involving actual or perceived threat of death or serious injury. Reports response of intense fear, helplessness, or horror to the traumatic event. Experiences disturbing and persistent thoughts, images, and/or perceptions of the traumatic event. Displays significant psychological and/or physiological distress resulting from internal and external clues that are reminiscent of the traumatic event. Intentionally avoids thoughts, feelings, or discussions related to the traumatic event. Intentionally avoids activities, places, people, or objects (e.g., up-armored vehicles) that evoke memories of the event. Displays a significant decline in interest and engagement in activities. Experiences disturbances in sleep. Reports difficulty concentrating as well as feelings of guilt. Reports hypervigilance. Symptoms present more than one month. Impairment in social, occupational, or other areas of functioning. Depressed or irritable mood. Decrease or loss of appetite. Diminished interest in or enjoyment of activities. Sleeplessness or hypersomnia. Lack of energy. Poor concentration and indecisiveness. Social withdrawal. Feelings of hopelessness, worthlessness, or inappropriate guilt. Unresolved grief issues. History of chronic or recurrent depression for which the client has taken antidepressant medication, been hospitalized, had outpatient treatment, or had a  course of electroconvulsive therapy. Goals: Learn and implement coping skills that result in a reduction of anxiety and worry, and  improved daily functioning. Enhance ability to effectively cope with the full variety of life's worries and anxieties. Stabilize anxiety level while increasing ability to function on a daily basis. Begin a healthy grieving process around the loss. Develop an awareness of how the avoidance of grieving has affected life and begin the healing process. Resolve the loss, reengaging in old relationships and initiating new contacts with others. Eliminate or reduce the negative impact trauma related symptoms have on social, occupational, and family functioning. Thinks about or openly discusses the traumatic event with others without experiencing psychological or physiological distress. No longer avoids persons, places, activities, and objects that are reminiscent of the traumatic event. Alleviate depressive symptoms and return to previous level of effective functioning. Recognize, accept, and cope with feelings of depression. Develop healthy thinking patterns and beliefs about self, others, and the world that lead to the alleviation and help prevent the relapse of depression. Appropriately grieve the loss in order to normalize mood and to return to previously adaptive level of functioning. Objectives target date for all objectives is 11/19/2024 Describe situations, thoughts, feelings, and actions associated with anxieties and worries, their impact on functioning, and attempts to resolve them.   50% Verbalize an understanding of the cognitive, physiological, and behavioral components of anxiety and its treatment.   40% Learn and implement calming skills to reduce overall anxiety and manage anxiety symptoms.  30% Learn and implement a strategy to limit the association between various environmental settings and worry, delaying the worry until a designated worry time.    20% Learn and implement problem-solving strategies for realistically addressing worries.   40% Identify, challenge, and replace biased, fearful self-talk with positive, realistic, and empowering self-talk.   65% Verbalize an understanding of the role that cognitive biases play in excessive irrational worry and persistent anxiety symptoms.   65% Identify and engage in pleasant activities on a daily basis.  65% Learn and implement relapse prevention strategies for managing possible future anxiety symptoms.   40% Learn to accept limitations in life and commit to tolerating, rather than avoiding, unpleasant emotions while accomplishing meaningful goals.  40% Tell in detail the story of the current loss that is triggering symptoms.   100% Identify what stages of grief have been experienced in the continuum of the grieving process.   40% (has not grieved mother's death) Begin verbalizing feelings associated with the loss.   20% Verbalize resolution of feelings of guilt and regret associated with the loss. 30% Identify and voice positives about the deceased loved one including previous positive experiences, positive characteristics, positive aspects of the relationship, and how these things may be remembered.   10% Learn and implement calming skills.   50% Learn and implement guided self-dialogue to manage thoughts, feelings, and urges brought on by encounters with trauma-related situations.   30% Acknowledge the need to implement anger control techniques; learn and implement anger management techniques.   30% Implement a regular exercise regimen as a stress release technique.   50% Describe current and past experiences with depression including their impact on functioning and attempts to resolve it.   70% Verbalize an accurate understanding of depression.   70% Identify and replace thoughts and beliefs that support depression.   40% Learn and implement behavioral strategies to overcome depression.    60% Identify important people in life, past and present, and describe the quality, good and poor, of those relationships.   30% Learn and implement problem-solving and decision-making skills.   30% Learn and  implement conflict resolution skills to resolve interpersonal problems.   30% Interventions: Use empathy, compassion, and support, allowing the client to tell in detail the story of his/her recent loss. Assign the client to make a list of all the regrets associated with actions toward or relationship with the deceased; process the list content toward resolution of these feelings. Assist the client in engaging in behaviors that celebrate the positive memorable aspects of the loved one and his/her life (e.g., placing memoriam in newspaper on anniversary of death, volunteering time to a favorite cause of the deceased person). Ask the client to discuss and/or list the positive aspects of and memories about his/her relationship with the lost loved one; reinforce the client's expression of positive memories and emotions (e.g., smiling, laughing); encourage the client to share these thoughts with supportive loved ones. Educate the client on the stages of the grieving process and answer any questions he/she may have. Assist the client in identifying the stages of grief that he/she has experienced and which stage he/she is presently working through. Ask the client to bring pictures or mementos connected with his/her loss to a session and talk about them (or assign Creating a Patent examiner in the Adult Psychotherapy Administrator, arts by Jenniffer). Assist the client in identifying and expressing feelings connected with his/her loss. Gently and sensitively explore the client's recollection of the facts of the traumatic incident and his/her cognitive and emotional reactions at the time; assess frequency, intensity, duration, and history of the client's PTSD symptoms and their impact on functioning (see How  the Trauma Affects Me in the Adult Psychotherapy Homework Planner by Jongsma); supplement with semi-structured assessment instrument if desired (see The Anxiety Disorders Interview Schedule - Adult Version). Using Cognitive Therapy techniques, explore the client's self-talk and beliefs about self, others, and the future that are a consequence of the trauma (e.g., themes of safety, trust, power, control, esteem, and intimacy); identify and challenge biases; assist him/her in generating appraisals that correct for the biases; test biased and alternatives predictions through behavioral experiments. Teach the client a guided self-dialogue procedure in which he/she learns to recognize maladaptive self-talk, challenges its biases, copes with engendered feelings, overcomes avoidance, and reinforces his/her accomplishments; review and reinforce progress, problem-solve obstacles. Utilize Eye Movement Desensitization and Reprocessing (EMDR) to reduce the client's emotional reactivity to the traumatic event and reduce PTSD symptoms. Assess the client for instances of poor anger management that have led to threats or actual violence that caused damage to property and/or injury to people (or assign Anger Journal in the Adult Psychotherapy Homework Planner by Jenniffer). Teach the client anger management techniques (see the Anger Control Problems chapter in this Planner). Develop and encourage a routine of physical exercise for the client. Teach the client calming skills (e.g., breathing retraining, relaxation, calming self-talk) to use in and between sessions when feeling overly distressed. Ask the client to describe his/her past experiences of anxiety and their impact on functioning; assess the focus, excessiveness, and uncontrollability of the worry and the type, frequency, intensity, and duration of his/her anxiety symptoms (consider using a structured interview such as The Anxiety Disorders Interview Schedule-Adult  Version). Explore the client's schema and self-talk that mediate his/her fear response; assist him/her in challenging the biases; replace the distorted messages with reality-based alternatives and positive, realistic self-talk that will increase his/her self-confidence in coping with irrational fears (see Cognitive Therapy of Anxiety Disorders by Gretta armin Mon). Teach the client problem-solving strategies involving specifically defining a problem, generating options for  addressing it, evaluating the pros and cons of each option, selecting and implementing an optional action, and reevaluating and refining the action (or assign Applying Problem-Solving to Interpersonal Conflict in the Adult Psychotherapy Homework Planner by Jenniffer). Engage the client in behavioral activation, increasing the client's contact with sources of reward, identifying processes that inhibit activation, and teaching skills to solve life problems (or assign Identify and Schedule Pleasant Activities in the Adult Psychotherapy Homework Planner by Jenniffer); use behavioral techniques such as instruction, rehearsal, role-playing, role reversal as needed to assist adoption into the client's daily life; reinforce success. Discuss with the client the distinction between a lapse and relapse, associating a lapse with an initial and reversible return of worry, anxiety symptoms, or urges to avoid, and relapse with the decision to continue the fearful and avoidant patterns. Identify and rehearse with the client the management of future situations or circumstances in which lapses could occur. Instruct the client to routinely use new therapeutic skills (e.g., relaxation, cognitive restructuring, exposure, and problem-solving) in daily life to address emergent worries, anxiety, and avoidant tendencies. Develop a coping card on which coping strategies and other important information (e.g., Breathe deeply and relax, Challenge unrealistic  worries, Use problem-solving) are written for the client's later use. Use techniques from Acceptance and Commitment Therapy to help client accept uncomfortable realities such as lack of complete control, imperfections, and uncertainty and tolerate unpleasant emotions and thoughts in order to accomplish value-consistent goals. Discuss how generalized anxiety typically involves excessive worry about unrealistic threats, various bodily expressions of tension, overarousal, and hypervigilance, and avoidance of what is threatening that interact to maintain the problem (see Mastery of Your Anxiety and Worry: Therapist Guide by Venson River, and Barlow; Treating Generalized Anxiety Disorder by Rygh and Red). Teach the client calming/relaxation skills (e.g., applied relaxation, progressive muscle relaxation, cue controlled relaxation; mindful breathing; biofeedback) and how to discriminate better between relaxation and tension; teach the client how to apply these skills to his/her daily life (e.g., New Directions in Progressive Muscle Relaxation by Thornell Collier, and Hazlett-Stevens; Treating Generalized Anxiety Disorder by Rygh and Red). Assign the client to read about progressive muscle relaxation and other calming strategies in relevant books or treatment manuals (e.g., Progressive Relaxation Training by Thornell and Collier; Mastery of Your Anxiety and Worry: Workbook by River armin Given). Explain the rationale for using a worry time as well as how it is to be used; agree upon and implement a worry time with the client. Teach the client how to recognize, stop, and postpone worry to the agreed upon worry time using skills such as thought stopping, relaxation, and redirecting attention (or assign Making Use of the Thought-Stopping Technique and/or Worry Time in the Adult Psychotherapy Homework Planner by Jongsma to assist skill development); encourage use in daily life; review and  reinforce success while providing corrective feedback toward improvement. Assist the client in analyzing his/her worries by examining potential biases such as the probability of the negative expectation occurring, the real consequences of it occurring, his/her ability to control the outcome, the worst possible outcome, and his/her ability to accept it (see Analyze the Probability of a Feared Event in the Adult Psychotherapy Homework Planner by Jenniffer; Cognitive Therapy of Anxiety Disorders by Gretta armin Mon). Encourage the client to share his/her thoughts and feelings of depression; express empathy and build rapport while identifying primary cognitive, behavioral, interpersonal, or other contributors to depression. Conduct Cognitive-Behavioral Therapy (see Cognitive Behavior Therapy by Mon; Overcoming Depression by Marine dunker al.), beginning  with helping the client learn the connection among cognition, depressive feelings, and actions. Assign the client to self-monitor thoughts, feelings, and actions in daily journal (e.g., Negative Thoughts Trigger Negative Feelings in the Adult Psychotherapy Homework Planner by Jenniffer; Daily Record of Dysfunctional Thoughts in Cognitive Therapy of Depression by Almarie Candida Gentry and Shona); process the journal material to challenge depressive thinking patterns and replace them with reality-based thoughts. Facilitate and reinforce the client's shift from biased depressive self-talk and beliefs to reality-based cognitive messages that enhance self-confidence and increase adaptive actions (see Positive Self-Talk in the Adult Psychotherapy Homework Planner by Jenniffer). Assist the client in developing skills that increase the likelihood of deriving pleasure from behavioral activation (e.g., assertiveness skills, developing an exercise plan, less internal/more external focus, increased social involvement); reinforce success. Conduct Interpersonal Therapy (see  Interpersonal Psychotherapy of Depression by Anne dunker al.), beginning with the assessment of the client's interpersonal inventory of important past and present relationships; develop a case formulation linking depression to grief, interpersonal role disputes, role transitions, and/or interpersonal deficits). Conduct Problem-Solving Therapy (see Problem-Solving Therapy by Francisco and Nezu) using techniques such as psychoeducation, modeling, and role-playing to teach client problem-solving skills (i.e., defining a problem specifically, generating possible solutions, evaluating the pros and cons of each solution, selecting and implementing a plan of action, evaluating the efficacy of the plan, accepting or revising the plan); role-play application of the problem-solving skill to a real life issue (or assign Applying Problem-Solving to Interpersonal Conflict in the Adult Psychotherapy Homework Planner by Jenniffer). Teach conflict resolution skills (e.g., empathy, active listening, I messages, respectful communication, assertiveness without aggression, compromise); use psychoeducation, modeling, role-playing, and rehearsal to work through several current conflicts; assign homework exercises; review and repeat so as to integrate their use into the client's life. Consistent with the treatment model, discuss how cognitive, behavioral, interpersonal, and/or other factors (e.g., family history) contribute to depression.  Diagnosis:Generalized anxiety disorder  Current mild episode of major depressive disorder without prior episode (HCC)  Plan:  -[place] to [facility] exit and take the back way home before next session -meet again on Friday, July 19, 2024 at 11am virtually.

## 2024-07-04 ENCOUNTER — Ambulatory Visit: Admitting: Physical Therapy

## 2024-07-04 ENCOUNTER — Encounter: Payer: Self-pay | Admitting: Physical Therapy

## 2024-07-04 DIAGNOSIS — R262 Difficulty in walking, not elsewhere classified: Secondary | ICD-10-CM | POA: Diagnosis not present

## 2024-07-04 DIAGNOSIS — M25661 Stiffness of right knee, not elsewhere classified: Secondary | ICD-10-CM

## 2024-07-04 DIAGNOSIS — R2689 Other abnormalities of gait and mobility: Secondary | ICD-10-CM

## 2024-07-04 DIAGNOSIS — R2681 Unsteadiness on feet: Secondary | ICD-10-CM

## 2024-07-04 DIAGNOSIS — M6281 Muscle weakness (generalized): Secondary | ICD-10-CM

## 2024-07-04 DIAGNOSIS — R279 Unspecified lack of coordination: Secondary | ICD-10-CM | POA: Diagnosis not present

## 2024-07-04 DIAGNOSIS — R269 Unspecified abnormalities of gait and mobility: Secondary | ICD-10-CM

## 2024-07-04 NOTE — Therapy (Signed)
 OUTPATIENT PHYSICAL THERAPY LOWER EXTREMITY   Patient Name: Tom Underwood MRN: [id number] DOB:December 22, 1950, 73 y.o., male Today's Date: 07/04/2024  END OF SESSION:  PT End of Session - 07/04/24 1257     Visit Number 12    Number of Visits 24    Date for PT Re-Evaluation 07/30/24    PT Start Time 1253    PT Stop Time 1349    PT Time Calculation (min) 56 min    Activity Tolerance Patient tolerated treatment well    Behavior During Therapy WFL for tasks assessed/performed         Past Medical History:  Diagnosis Date   Anxiety    Arthritis    Atrial fibrillation (HCC)    a.) CHA2DS2-VASc = 4 (age, HTN, CVA x2). b.) s/p 200 J synchronized cardioversion (DCCV) on 12/08/2021. c.) rate/rythum maintained on oral metoprolol  tartrate; chronically anticoagulated using rivaroxaban .   BPH (benign prostatic hyperplasia)    DDD (degenerative disc disease), lumbar    Decreased libido    Depression    Diverticulitis    Diverticulosis    GERD (gastroesophageal reflux disease)    Hepatic steatosis    History of 2019 novel coronavirus disease (COVID-19) 12/28/2020   History of cardiac murmur in childhood    History of kidney stones    HLD (hyperlipidemia)    Hypertension    Hypogonadism in male    OSA on CPAP    Pre-diabetes    Rhinitis, allergic    Stroke (HCC) 07/23/2019   a.) LEFT posterior cor radiata. b.) residual RIGHT sided weakness   Past Surgical History:  Procedure Laterality Date   CARDIOVERSION N/A 12/08/2021   Procedure: CARDIOVERSION;  Surgeon: Mady Bruckner, MD;  Location: [facility] ORS;  Service: Cardiovascular;  Laterality: N/A;   COLONOSCOPY     COLONOSCOPY WITH PROPOFOL  N/A 10/06/2023   Procedure: COLONOSCOPY WITH PROPOFOL ;  Surgeon: Therisa Bi, MD;  Location: [facility] ENDOSCOPY;  Service: Gastroenterology;  Laterality: N/A;   EVALUATION UNDER ANESTHESIA WITH HEMORRHOIDECTOMY N/A 04/15/2022   Procedure: EXAM UNDER ANESTHESIA WITH HEMORRHOIDECTOMY;  Surgeon: Tye Millet, DO;  Location: [facility] ORS;  Service: General;  Laterality: N/A;   EXTERNAL EAR SURGERY     EYE SURGERY     FINGER SURGERY Right    lateration to 5 th digit   KNEE SURGERY     left eye     POLYPECTOMY  10/06/2023   Procedure: POLYPECTOMY;  Surgeon: Therisa Bi, MD;  Location: [facility] ENDOSCOPY;  Service: Gastroenterology;;   RECTAL EXAM UNDER ANESTHESIA N/A 04/23/2022   Procedure: RECTAL EXAM UNDER ANESTHESIA WITH LIGATION OF BLEEDING;  Surgeon: Desiderio Schanz, MD;  Location: [facility] ORS;  Service: General;  Laterality: N/A;   Patient Active Problem List   Diagnosis Date Noted   Chronic venous insufficiency of lower extremity 05/23/2024   History of atrial fibrillation 11/22/2023   Major depression, recurrent, chronic (HCC) 11/22/2023   Adenomatous polyp of colon 10/06/2023   Gastroesophageal reflux disease without esophagitis 05/17/2022   History of syncope 05/17/2022   History of hemorrhoidectomy 05/17/2022   Vitamin D  deficiency 05/17/2022   Lower extremity edema 05/17/2022   History of rectal bleeding 05/17/2022   Morbid obesity (HCC)    Hypertension    Hemiparesis of right dominant side as late effect of cerebral infarction (HCC) 08/13/2019   History of kidney stones 05/02/2018   ED (erectile dysfunction) 05/02/2018   Hyperglycemia 01/30/2017   Arthritis of knee, degenerative 01/30/2017   BPH with obstruction/lower urinary  tract symptoms 02/03/2016   Dyslipidemia 08/04/2015   OSA on CPAP 06/30/2015    PCP: Glenard Mire, MD  REFERRING PROVIDER: Sowles, Krichna, MD  REFERRING DIAG:  [phone] (ICD-10-CM) - Hemiparesis of right dominant side as late effect of cerebral infarction (HCC)  R26.81 (ICD-10-CM) - Gait instability    THERAPY DIAG:  Abnormality of gait and mobility  Muscle weakness (generalized)  Joint stiffness of both knees  Unsteadiness on feet  Unspecified lack of coordination  Other abnormalities of gait and mobility  Rationale for Evaluation and  Treatment: Rehabilitation  ONSET DATE: chronic  SUBJECTIVE:   SUBJECTIVE STATEMENT: Pt. Reports <5/10 B knee pain walking around PT clinic.  Pt. States he has been walking but limited by knee pain/ endurance.  Pt. Referred back to PT by MD since discharge from PT a few months ago due to regression in LE strength/ endurance.  Pt. Had a R sided stroke on 08/23/2019 and lives with wife and uses [facility] for safety with walking.  Pt. Fearful with walking on varying terrain/ curbs.  No recent falls reported.    PERTINENT HISTORY: Pt. Known well to PT clinic.    PAIN:  Are you having pain? Yes: NPRS scale: 4-5/10  Pain location: B knees Pain description: achy/ persistent Aggravating factors: walking/ step ups Relieving factors: rest  PRECAUTIONS: Fall  RED FLAGS: None   WEIGHT BEARING RESTRICTIONS: No  FALLS:  Has patient fallen in last 6 months? No  LIVING ENVIRONMENT: Lives with: lives with their spouse Lives in: Mobile home Stairs: Yes: External: 5 steps; can reach both Has following equipment at home: Single point cane  OCCUPATION: Retired  PLOF: Independent with household mobility with device  PATIENT GOALS: Increase R sided muscle strength/ walking endurance/ balance.    NEXT MD VISIT: PRN  OBJECTIVE:  Note: Objective measures were completed at Evaluation unless otherwise noted.  PATIENT SURVEYS:  LEFS: 18 out of 80.    COGNITION: Overall cognitive status: Within functional limits for tasks assessed     SENSATION: Light touch: Impaired   EDEMA:  Moderate B lower leg swelling.  Pt. Has compression stockings/ lymphedema pumps.  Marked improvement in lower leg swelling as compared to last PT tx. Session.     MUSCLE LENGTH: Hamstrings: NT Thomas test: Unable to test due to limited supine positioning  POSTURE: rounded shoulders, forward head, and flexed trunk   PALPATION: (+) B knee joint line and lower leg tenderness.  Pitting edema noted in B lower legs  (slightly improved from last PT tx. Session several months ago).    LOWER EXTREMITY ROM:  Active ROM Right eval Left eval  Hip flexion <90 deg. <90 deg.  Hip extension    Hip abduction    Hip adduction    Hip internal rotation    Hip external rotation    Knee flexion    Knee extension    Ankle dorsiflexion    Ankle plantarflexion    Ankle inversion    Ankle eversion     (Blank rows = not tested)  LOWER EXTREMITY MMT:  MMT Right eval Left eval  Hip flexion 4- 4  Hip extension    Hip abduction 4 4+  Hip adduction    Hip internal rotation 4+ 4+  Hip external rotation 4 4  Knee flexion 5 5  Knee extension 5 5  Ankle dorsiflexion    Ankle plantarflexion    Ankle inversion    Ankle eversion     (Blank rows =  not tested)  LOWER EXTREMITY SPECIAL TESTS:  NT  FUNCTIONAL TESTS:  5 times sit to stand: 23.44 sec. (1st attempt), 12.67 sec. (2nd attempt).   Berg Balance Scale: TBD  GAIT: Distance walked: in clinic/ outside on sidewalk Assistive device utilized: Single point cane Level of assistance: Modified independence Comments: slow gait pattern with decreased arm swing, heel strike, reciprocal stride length. R foot ER.  No LOB during tx. Session.  Several seated rest breaks (vital WNL)  LEFS: 14/80  Berg: 42/56 5xSTS: 16.03 sec   MMT Right eval Left eval  Hip flexion 4+ 4+  Hip extension    Hip abduction 5 5  Hip adduction    Hip internal rotation 4+ 4+  Hip external rotation 4 4+  Knee flexion 5 5  Knee extension 5 5  Ankle dorsiflexion    Ankle plantarflexion    Ankle inversion    Ankle eversion     (Blank rows = not tested)                                                     TREATMENT DATE: 07/04/2024  Subjective:  Pt. Reports 7/10 NPS for the L knee, 8/10 NPS on the R knee today.  Pt. Reports an increase in knee pain which may be due to rainy weather.  Pt arrived to PT with a 2-wheeled walker and compression socks.  Pt. 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    There.act.:   Seated UBE: 3 min. F/b (maintaining 2 lights)- no Nustep today secondary to pt. Concerned about increase in B knee pain.    Walking in hallway with RW 2.5 laps with no rest break.  BP: 161/66 HR: 54 bpm.  O2 sat: 95%.  TRX squats: 20x.  Bathroom break requested.    Waking in //-bars with increase step length/ hip flexion (3 laps)- forward and sideways.  Marked fatigue/ increase SOB.    Walking in clinic with use of RW (2 laps in gym).  BP: 146/67 (improved)  Nautilus: seated lat. Pull downs with wand (60#)- 20x2.    Discussed importance of daily walking/ activity/ HEP  PATIENT EDUCATION:  Education details: Discussed walking program/ home ex. Person educated: Patient Education method: Medical illustrator Education comprehension: verbalized understanding and returned demonstration  HOME EXERCISE PROGRAM: Standing 3-way hip ex./ walking program.  ASSESSMENT:  CLINICAL IMPRESSION: Pt. Remains limited with standing tolerance/ walking in clinic secondary to marked fatigue/ B knee pain. Pt. demonstrated SOB during all activities today and requires several seated rest breaks.  No LOB during tx. And pt. Benefits from cuing to correct posture/ technique with LE strengthening ex.  PT monitored pts. Vitals with walking endurance/ TRX ex.   Pt would benefit from ongoing PT to address decreased endurance, postural abnormalities, and B knee pain.   OBJECTIVE IMPAIRMENTS: Abnormal gait, cardiopulmonary status limiting activity, decreased activity tolerance, decreased balance, decreased coordination, decreased endurance, decreased mobility, difficulty walking, decreased ROM, decreased strength, hypomobility, impaired flexibility, impaired sensation, impaired UE functional use, improper body mechanics, postural dysfunction, obesity, and pain.   ACTIVITY LIMITATIONS: carrying, lifting, bending, standing, squatting,  stairs, transfers, bed mobility, bathing, toileting, dressing, hygiene/grooming, and locomotion level  PARTICIPATION LIMITATIONS: meal prep, cleaning, driving, shopping, and community activity  PERSONAL FACTORS: Past/current experiences are  also affecting patient's functional outcome.   REHAB POTENTIAL: Good  CLINICAL DECISION MAKING: Evolving/moderate complexity  EVALUATION COMPLEXITY: Moderate   GOALS: Goals reviewed with patient? Yes  SHORT TERM GOALS: Target date: 06/04/24 Pt. Will increase B hip flexion to >90 deg. In supine position to improve strength/ step length with gait.   Baseline:  see above.  7/15: to be assessed next visit, pt unable to attain supine position  Goal status: Partially met  2.  Pt. Will complete 5xSTS in <10 sec. To improve mobility/ decrease fall risk.   Baseline: 5 times sit to stand: 23.44 sec. (1st attempt), 12.67 sec. (2nd attempt). 7/15: 16.03 sec  Goal status: Not met   LONG TERM GOALS: Target date: 07/30/24  Pt. Independent with HEP to increase B LE muscle strength 1/2 muscle grade to improve mobility/ decrease fall risk.   Baseline: see above. 7/15: strength maintained or improved  Goal status: Partially met  2.  Pt. Will complete Berg balance test and score >48/56 to decrease fall risk/ improve walking with least assistive device.   Baseline:  43/56,  7/15: 42/56 Goal status: Not met  3.  Pt. Will increase LEFS to >30 out of 80 to improve pain-free functional mobility.   Baseline: 18 out of 80. 7/15: 14/80  Goal status: Not met   4.  Pt. Able to ambulate 15 minutes with use of SPC and consistent step pattern on outside surfaces.   Baseline:  increase B knee pain/ limited endurance. 7/15: not able to test due to weather  Goal status: Not met  PLAN:  PT FREQUENCY: 1-2x/week  PT DURATION: 12 weeks  PLANNED INTERVENTIONS: 97750- Physical Performance Testing, 97110-Therapeutic exercises, 97530- Therapeutic activity, 97112- Neuromuscular  re-education, 97535- Self Care, [id number]- Manual therapy, [phone]- Gait training, [phone]- Electrical stimulation (unattended), Patient/Family education, Balance training, Stair training, Joint mobilization, Spinal mobilization, Cryotherapy, and Moist heat  PLAN FOR NEXT SESSION: Progress balance and postural exercises    Ozell JAYSON Sero, PT, DPT # [phone] Physical Therapist - [facility] 07/04/2024, 7:48 PM

## 2024-07-11 ENCOUNTER — Ambulatory Visit: Attending: Family Medicine | Admitting: Physical Therapy

## 2024-07-11 DIAGNOSIS — R269 Unspecified abnormalities of gait and mobility: Secondary | ICD-10-CM | POA: Diagnosis not present

## 2024-07-11 DIAGNOSIS — M25661 Stiffness of right knee, not elsewhere classified: Secondary | ICD-10-CM | POA: Insufficient documentation

## 2024-07-11 DIAGNOSIS — M6281 Muscle weakness (generalized): Secondary | ICD-10-CM | POA: Diagnosis not present

## 2024-07-11 DIAGNOSIS — M25662 Stiffness of left knee, not elsewhere classified: Secondary | ICD-10-CM | POA: Diagnosis not present

## 2024-07-11 DIAGNOSIS — R2681 Unsteadiness on feet: Secondary | ICD-10-CM | POA: Insufficient documentation

## 2024-07-11 DIAGNOSIS — R262 Difficulty in walking, not elsewhere classified: Secondary | ICD-10-CM | POA: Diagnosis not present

## 2024-07-11 DIAGNOSIS — R279 Unspecified lack of coordination: Secondary | ICD-10-CM | POA: Insufficient documentation

## 2024-07-11 NOTE — Therapy (Signed)
 OUTPATIENT PHYSICAL THERAPY LOWER EXTREMITY   Patient Name: Tom Underwood MRN: [id number] DOB:August 02, 1951, 73 y.o., male Today's Date: 07/11/2024  END OF SESSION:  PT End of Session - 07/11/24 1306     Visit Number 13    Number of Visits 24    Date for PT Re-Evaluation 07/30/24    PT Start Time 1306    PT Stop Time 1402    PT Time Calculation (min) 56 min    Activity Tolerance Patient tolerated treatment well    Behavior During Therapy WFL for tasks assessed/performed         Past Medical History:  Diagnosis Date   Anxiety    Arthritis    Atrial fibrillation (HCC)    a.) CHA2DS2-VASc = 4 (age, HTN, CVA x2). b.) s/p 200 J synchronized cardioversion (DCCV) on 12/08/2021. c.) rate/rythum maintained on oral metoprolol  tartrate; chronically anticoagulated using rivaroxaban .   BPH (benign prostatic hyperplasia)    DDD (degenerative disc disease), lumbar    Decreased libido    Depression    Diverticulitis    Diverticulosis    GERD (gastroesophageal reflux disease)    Hepatic steatosis    History of 2019 novel coronavirus disease (COVID-19) 12/28/2020   History of cardiac murmur in childhood    History of kidney stones    HLD (hyperlipidemia)    Hypertension    Hypogonadism in male    OSA on CPAP    Pre-diabetes    Rhinitis, allergic    Stroke (HCC) 07/23/2019   a.) LEFT posterior cor radiata. b.) residual RIGHT sided weakness   Past Surgical History:  Procedure Laterality Date   CARDIOVERSION N/A 12/08/2021   Procedure: CARDIOVERSION;  Surgeon: Mady Bruckner, MD;  Location: [facility] ORS;  Service: Cardiovascular;  Laterality: N/A;   COLONOSCOPY     COLONOSCOPY WITH PROPOFOL  N/A 10/06/2023   Procedure: COLONOSCOPY WITH PROPOFOL ;  Surgeon: Therisa Bi, MD;  Location: [facility] ENDOSCOPY;  Service: Gastroenterology;  Laterality: N/A;   EVALUATION UNDER ANESTHESIA WITH HEMORRHOIDECTOMY N/A 04/15/2022   Procedure: EXAM UNDER ANESTHESIA WITH HEMORRHOIDECTOMY;  Surgeon: Tye Millet, DO;  Location: [facility] ORS;  Service: General;  Laterality: N/A;   EXTERNAL EAR SURGERY     EYE SURGERY     FINGER SURGERY Right    lateration to 5 th digit   KNEE SURGERY     left eye     POLYPECTOMY  10/06/2023   Procedure: POLYPECTOMY;  Surgeon: Therisa Bi, MD;  Location: [facility] ENDOSCOPY;  Service: Gastroenterology;;   RECTAL EXAM UNDER ANESTHESIA N/A 04/23/2022   Procedure: RECTAL EXAM UNDER ANESTHESIA WITH LIGATION OF BLEEDING;  Surgeon: Desiderio Schanz, MD;  Location: [facility] ORS;  Service: General;  Laterality: N/A;   Patient Active Problem List   Diagnosis Date Noted   Chronic venous insufficiency of lower extremity 05/23/2024   History of atrial fibrillation 11/22/2023   Major depression, recurrent, chronic (HCC) 11/22/2023   Adenomatous polyp of colon 10/06/2023   Gastroesophageal reflux disease without esophagitis 05/17/2022   History of syncope 05/17/2022   History of hemorrhoidectomy 05/17/2022   Vitamin D  deficiency 05/17/2022   Lower extremity edema 05/17/2022   History of rectal bleeding 05/17/2022   Morbid obesity (HCC)    Hypertension    Hemiparesis of right dominant side as late effect of cerebral infarction (HCC) 08/13/2019   History of kidney stones 05/02/2018   ED (erectile dysfunction) 05/02/2018   Hyperglycemia 01/30/2017   Arthritis of knee, degenerative 01/30/2017   BPH with obstruction/lower urinary  tract symptoms 02/03/2016   Dyslipidemia 08/04/2015   OSA on CPAP 06/30/2015    PCP: Glenard Mire, MD  REFERRING PROVIDER: Sowles, Krichna, MD  REFERRING DIAG:  [phone] (ICD-10-CM) - Hemiparesis of right dominant side as late effect of cerebral infarction (HCC)  R26.81 (ICD-10-CM) - Gait instability    THERAPY DIAG:  Abnormality of gait and mobility  Muscle weakness (generalized)  Joint stiffness of both knees  Unsteadiness on feet  Unspecified lack of coordination  Rationale for Evaluation and Treatment: Rehabilitation  ONSET DATE:  chronic  SUBJECTIVE:   SUBJECTIVE STATEMENT: Pt. Reports <5/10 B knee pain walking around PT clinic.  Pt. States he has been walking but limited by knee pain/ endurance.  Pt. Referred back to PT by MD since discharge from PT a few months ago due to regression in LE strength/ endurance.  Pt. Had a R sided stroke on 08/23/2019 and lives with wife and uses [facility] for safety with walking.  Pt. Fearful with walking on varying terrain/ curbs.  No recent falls reported.    PERTINENT HISTORY: Pt. Known well to PT clinic.    PAIN:  Are you having pain? Yes: NPRS scale: 4-5/10  Pain location: B knees Pain description: achy/ persistent Aggravating factors: walking/ step ups Relieving factors: rest  PRECAUTIONS: Fall  RED FLAGS: None   WEIGHT BEARING RESTRICTIONS: No  FALLS:  Has patient fallen in last 6 months? No  LIVING ENVIRONMENT: Lives with: lives with their spouse Lives in: Mobile home Stairs: Yes: External: 5 steps; can reach both Has following equipment at home: Single point cane  OCCUPATION: Retired  PLOF: Independent with household mobility with device  PATIENT GOALS: Increase R sided muscle strength/ walking endurance/ balance.    NEXT MD VISIT: PRN  OBJECTIVE:  Note: Objective measures were completed at Evaluation unless otherwise noted.  PATIENT SURVEYS:  LEFS: 18 out of 80.    COGNITION: Overall cognitive status: Within functional limits for tasks assessed     SENSATION: Light touch: Impaired   EDEMA:  Moderate B lower leg swelling.  Pt. Has compression stockings/ lymphedema pumps.  Marked improvement in lower leg swelling as compared to last PT tx. Session.     MUSCLE LENGTH: Hamstrings: NT Thomas test: Unable to test due to limited supine positioning  POSTURE: rounded shoulders, forward head, and flexed trunk   PALPATION: (+) B knee joint line and lower leg tenderness.  Pitting edema noted in B lower legs (slightly improved from last PT tx. Session  several months ago).    LOWER EXTREMITY ROM:  Active ROM Right eval Left eval  Hip flexion <90 deg. <90 deg.  Hip extension    Hip abduction    Hip adduction    Hip internal rotation    Hip external rotation    Knee flexion    Knee extension    Ankle dorsiflexion    Ankle plantarflexion    Ankle inversion    Ankle eversion     (Blank rows = not tested)  LOWER EXTREMITY MMT:  MMT Right eval Left eval  Hip flexion 4- 4  Hip extension    Hip abduction 4 4+  Hip adduction    Hip internal rotation 4+ 4+  Hip external rotation 4 4  Knee flexion 5 5  Knee extension 5 5  Ankle dorsiflexion    Ankle plantarflexion    Ankle inversion    Ankle eversion     (Blank rows = not tested)  LOWER EXTREMITY SPECIAL  TESTS:  NT  FUNCTIONAL TESTS:  5 times sit to stand: 23.44 sec. (1st attempt), 12.67 sec. (2nd attempt).   Berg Balance Scale: TBD  GAIT: Distance walked: in clinic/ outside on sidewalk Assistive device utilized: Single point cane Level of assistance: Modified independence Comments: slow gait pattern with decreased arm swing, heel strike, reciprocal stride length. R foot ER.  No LOB during tx. Session.  Several seated rest breaks (vital WNL)  LEFS: 14/80  Berg: 42/56 5xSTS: 16.03 sec   MMT Right eval Left eval  Hip flexion 4+ 4+  Hip extension    Hip abduction 5 5  Hip adduction    Hip internal rotation 4+ 4+  Hip external rotation 4 4+  Knee flexion 5 5  Knee extension 5 5  Ankle dorsiflexion    Ankle plantarflexion    Ankle inversion    Ankle eversion     (Blank rows = not tested)                                                     TREATMENT DATE: 07/11/2024  Subjective:  Pt. Reports 4/10 NPS for the L knee, 6/10 NPS on the R knee today. Pt. also states that he does not have a follow up with his doctor until September. Pt. Also states that he had significant swelling in both knees over the past two nights.    There.act.:   Seated UBE: 12 min. F/b  (maintaining 2 lights)- no Nustep today secondary to pt. Concerned about increase in B knee pain.    TRX squats: 25x2 (modified knee flexion/ range).    Nautilus: standing lat. Pull downs with wand (60#)- 25x2.   Walking in hallway 500 ft. with RW  ft. with no rest break required. (Stopped due to increase in bilateral knee pain to a 6/10 on left and 8/10 on right).    Discussed importance of daily walking/ activity/ HEP    PATIENT EDUCATION:  Education details: Discussed walking program/ home ex. Person educated: Patient Education method: Medical illustrator Education comprehension: verbalized understanding and returned demonstration  HOME EXERCISE PROGRAM: Standing 3-way hip ex./ walking program.  ASSESSMENT:  CLINICAL IMPRESSION: Pt. Was progressed to completing Nautilus pull downs in standing position on this day which he was able to complete without significant difficulty. Pt. remains limited with maintaining upright posture during walking activities and therapeutic exercises with excessive left lateral lean noted during standing pull downs despite verbal cueing to stay upright. Pt. required long duration seated rest breaks in between each exercise to recover from SOB and general muscular fatigue. Pt. Was able to ambulate with RW and supervision assist in hallway for 500 ft. Before increase in bilateral knee pain caused him to stop (L: 6/10, R: 8/10). Pt. would benefit from ongoing PT to address decreased cardiovascular and LE endurance, postural abnormalities, and B knee pain.   OBJECTIVE IMPAIRMENTS: Abnormal gait, cardiopulmonary status limiting activity, decreased activity tolerance, decreased balance, decreased coordination, decreased endurance, decreased mobility, difficulty walking, decreased ROM, decreased strength, hypomobility, impaired flexibility, impaired sensation, impaired UE functional use, improper body mechanics, postural dysfunction, obesity, and pain.    ACTIVITY LIMITATIONS: carrying, lifting, bending, standing, squatting, stairs, transfers, bed mobility, bathing, toileting, dressing, hygiene/grooming, and locomotion level  PARTICIPATION LIMITATIONS: meal prep, cleaning, driving, shopping, and community activity  PERSONAL FACTORS: Past/current  experiences are also affecting patient's functional outcome.   REHAB POTENTIAL: Good  CLINICAL DECISION MAKING: Evolving/moderate complexity  EVALUATION COMPLEXITY: Moderate   GOALS: Goals reviewed with patient? Yes  SHORT TERM GOALS: Target date: 06/04/24 Pt. Will increase B hip flexion to >90 deg. In supine position to improve strength/ step length with gait.   Baseline:  see above.  7/15: to be assessed next visit, pt unable to attain supine position  Goal status: Partially met  2.  Pt. Will complete 5xSTS in <10 sec. To improve mobility/ decrease fall risk.   Baseline: 5 times sit to stand: 23.44 sec. (1st attempt), 12.67 sec. (2nd attempt). 7/15: 16.03 sec  Goal status: Not met   LONG TERM GOALS: Target date: 07/30/24  Pt. Independent with HEP to increase B LE muscle strength 1/2 muscle grade to improve mobility/ decrease fall risk.   Baseline: see above. 7/15: strength maintained or improved  Goal status: Partially met  2.  Pt. Will complete Berg balance test and score >48/56 to decrease fall risk/ improve walking with least assistive device.   Baseline:  43/56,  7/15: 42/56 Goal status: Not met  3.  Pt. Will increase LEFS to >30 out of 80 to improve pain-free functional mobility.   Baseline: 18 out of 80. 7/15: 14/80  Goal status: Not met   4.  Pt. Able to ambulate 15 minutes with use of SPC and consistent step pattern on outside surfaces.   Baseline:  increase B knee pain/ limited endurance. 7/15: not able to test due to weather  Goal status: Not met  PLAN:  PT FREQUENCY: 1-2x/week  PT DURATION: 12 weeks  PLANNED INTERVENTIONS: 97750- Physical Performance Testing,  97110-Therapeutic exercises, 97530- Therapeutic activity, 97112- Neuromuscular re-education, 97535- Self Care, [id number]- Manual therapy, [phone]- Gait training, [phone]- Electrical stimulation (unattended), Patient/Family education, Balance training, Stair training, Joint mobilization, Spinal mobilization, Cryotherapy, and Moist heat  PLAN FOR NEXT SESSION: Continue ambulation activities to challenge endurance capacities and strengthening of gross bilateral Les to improve functional balance if pain is tolerable.    Curtistine Bracket, SPT Ozell JAYSON Sero, PT, DPT # [phone] Physical Therapist - [facility] 07/11/2024, 2:35 PM

## 2024-07-16 ENCOUNTER — Encounter: Payer: Self-pay | Admitting: Physical Therapy

## 2024-07-16 ENCOUNTER — Ambulatory Visit: Admitting: Physical Therapy

## 2024-07-16 DIAGNOSIS — R2681 Unsteadiness on feet: Secondary | ICD-10-CM

## 2024-07-16 DIAGNOSIS — R279 Unspecified lack of coordination: Secondary | ICD-10-CM | POA: Diagnosis not present

## 2024-07-16 DIAGNOSIS — M25662 Stiffness of left knee, not elsewhere classified: Secondary | ICD-10-CM | POA: Diagnosis not present

## 2024-07-16 DIAGNOSIS — R269 Unspecified abnormalities of gait and mobility: Secondary | ICD-10-CM

## 2024-07-16 DIAGNOSIS — M25661 Stiffness of right knee, not elsewhere classified: Secondary | ICD-10-CM

## 2024-07-16 DIAGNOSIS — M6281 Muscle weakness (generalized): Secondary | ICD-10-CM

## 2024-07-16 NOTE — Therapy (Signed)
 OUTPATIENT PHYSICAL THERAPY LOWER EXTREMITY   Patient Name: Tom Underwood MRN: [id number] DOB:[birth year], 73 y.o., male Today's Date: 07/16/2024  END OF SESSION:  PT End of Session - 07/16/24 1304     Visit Number 14    Number of Visits 24    Date for PT Re-Evaluation 07/30/24    PT Start Time 1304    PT Stop Time 1405    PT Time Calculation (min) 61 min    Activity Tolerance Patient tolerated treatment well    Behavior During Therapy WFL for tasks assessed/performed         Past Medical History:  Diagnosis Date   Anxiety    Arthritis    Atrial fibrillation (HCC)    a.) CHA2DS2-VASc = 4 (age, HTN, CVA x2). b.) s/p 200 J synchronized cardioversion (DCCV) on 12/08/2021. c.) rate/rythum maintained on oral metoprolol  tartrate; chronically anticoagulated using rivaroxaban .   BPH (benign prostatic hyperplasia)    DDD (degenerative disc disease), lumbar    Decreased libido    Depression    Diverticulitis    Diverticulosis    GERD (gastroesophageal reflux disease)    Hepatic steatosis    History of 2019 novel coronavirus disease (COVID-19) 12/28/2020   History of cardiac murmur in childhood    History of kidney stones    HLD (hyperlipidemia)    Hypertension    Hypogonadism in male    OSA on CPAP    Pre-diabetes    Rhinitis, allergic    Stroke (HCC) 07/23/2019   a.) LEFT posterior cor radiata. b.) residual RIGHT sided weakness   Past Surgical History:  Procedure Laterality Date   CARDIOVERSION N/A 12/08/2021   Procedure: CARDIOVERSION;  Surgeon: Mady Bruckner, MD;  Location: [facility] ORS;  Service: Cardiovascular;  Laterality: N/A;   COLONOSCOPY     COLONOSCOPY WITH PROPOFOL  N/A 10/06/2023   Procedure: COLONOSCOPY WITH PROPOFOL ;  Surgeon: Therisa Bi, MD;  Location: [facility] ENDOSCOPY;  Service: Gastroenterology;  Laterality: N/A;   EVALUATION UNDER ANESTHESIA WITH HEMORRHOIDECTOMY N/A 04/15/2022   Procedure: EXAM UNDER ANESTHESIA WITH HEMORRHOIDECTOMY;  Surgeon: Tye Millet, DO;  Location: [facility] ORS;  Service: General;  Laterality: N/A;   EXTERNAL EAR SURGERY     EYE SURGERY     FINGER SURGERY Right    lateration to 5 th digit   KNEE SURGERY     left eye     POLYPECTOMY  10/06/2023   Procedure: POLYPECTOMY;  Surgeon: Therisa Bi, MD;  Location: [facility] ENDOSCOPY;  Service: Gastroenterology;;   RECTAL EXAM UNDER ANESTHESIA N/A 04/23/2022   Procedure: RECTAL EXAM UNDER ANESTHESIA WITH LIGATION OF BLEEDING;  Surgeon: Desiderio Schanz, MD;  Location: [facility] ORS;  Service: General;  Laterality: N/A;   Patient Active Problem List   Diagnosis Date Noted   Chronic venous insufficiency of lower extremity 05/23/2024   History of atrial fibrillation 11/22/2023   Major depression, recurrent, chronic (HCC) 11/22/2023   Adenomatous polyp of colon 10/06/2023   Gastroesophageal reflux disease without esophagitis 05/17/2022   History of syncope 05/17/2022   History of hemorrhoidectomy 05/17/2022   Vitamin D  deficiency 05/17/2022   Lower extremity edema 05/17/2022   History of rectal bleeding 05/17/2022   Morbid obesity (HCC)    Hypertension    Hemiparesis of right dominant side as late effect of cerebral infarction (HCC) 08/13/2019   History of kidney stones 05/02/2018   ED (erectile dysfunction) 05/02/2018   Hyperglycemia 01/30/2017   Arthritis of knee, degenerative 01/30/2017   BPH with obstruction/lower urinary  tract symptoms 02/03/2016   Dyslipidemia 08/04/2015   OSA on CPAP 06/30/2015    PCP: Glenard Mire, MD  REFERRING PROVIDER: Sowles, Krichna, MD  REFERRING DIAG:  [phone] (ICD-10-CM) - Hemiparesis of right dominant side as late effect of cerebral infarction (HCC)  R26.81 (ICD-10-CM) - Gait instability    THERAPY DIAG:  Abnormality of gait and mobility  Muscle weakness (generalized)  Joint stiffness of both knees  Unsteadiness on feet  Unspecified lack of coordination  Rationale for Evaluation and Treatment: Rehabilitation  ONSET DATE:  chronic  SUBJECTIVE:   SUBJECTIVE STATEMENT: Pt. Reports <5/10 B knee pain walking around PT clinic.  Pt. States he has been walking but limited by knee pain/ endurance.  Pt. Referred back to PT by MD since discharge from PT a few months ago due to regression in LE strength/ endurance.  Pt. Had a R sided stroke on 08/23/2019 and lives with wife and uses [facility] for safety with walking.  Pt. Fearful with walking on varying terrain/ curbs.  No recent falls reported.    PERTINENT HISTORY: Pt. Known well to PT clinic.    PAIN:  Are you having pain? Yes: NPRS scale: 4-5/10  Pain location: B knees Pain description: achy/ persistent Aggravating factors: walking/ step ups Relieving factors: rest  PRECAUTIONS: Fall  RED FLAGS: None   WEIGHT BEARING RESTRICTIONS: No  FALLS:  Has patient fallen in last 6 months? No  LIVING ENVIRONMENT: Lives with: lives with their spouse Lives in: Mobile home Stairs: Yes: External: 5 steps; can reach both Has following equipment at home: Single point cane  OCCUPATION: Retired  PLOF: Independent with household mobility with device  PATIENT GOALS: Increase R sided muscle strength/ walking endurance/ balance.    NEXT MD VISIT: PRN  OBJECTIVE:  Note: Objective measures were completed at Evaluation unless otherwise noted.  PATIENT SURVEYS:  LEFS: 18 out of 80.    COGNITION: Overall cognitive status: Within functional limits for tasks assessed     SENSATION: Light touch: Impaired   EDEMA:  Moderate B lower leg swelling.  Pt. Has compression stockings/ lymphedema pumps.  Marked improvement in lower leg swelling as compared to last PT tx. Session.     MUSCLE LENGTH: Hamstrings: NT Thomas test: Unable to test due to limited supine positioning  POSTURE: rounded shoulders, forward head, and flexed trunk   PALPATION: (+) B knee joint line and lower leg tenderness.  Pitting edema noted in B lower legs (slightly improved from last PT tx. Session  several months ago).    LOWER EXTREMITY ROM:  Active ROM Right eval Left eval  Hip flexion <90 deg. <90 deg.  Hip extension    Hip abduction    Hip adduction    Hip internal rotation    Hip external rotation    Knee flexion    Knee extension    Ankle dorsiflexion    Ankle plantarflexion    Ankle inversion    Ankle eversion     (Blank rows = not tested)  LOWER EXTREMITY MMT:  MMT Right eval Left eval  Hip flexion 4- 4  Hip extension    Hip abduction 4 4+  Hip adduction    Hip internal rotation 4+ 4+  Hip external rotation 4 4  Knee flexion 5 5  Knee extension 5 5  Ankle dorsiflexion    Ankle plantarflexion    Ankle inversion    Ankle eversion     (Blank rows = not tested)  LOWER EXTREMITY SPECIAL  TESTS:  NT  FUNCTIONAL TESTS:  5 times sit to stand: 23.44 sec. (1st attempt), 12.67 sec. (2nd attempt).   Berg Balance Scale: TBD  GAIT: Distance walked: in clinic/ outside on sidewalk Assistive device utilized: Single point cane Level of assistance: Modified independence Comments: slow gait pattern with decreased arm swing, heel strike, reciprocal stride length. R foot ER.  No LOB during tx. Session.  Several seated rest breaks (vital WNL)  LEFS: 14/80  Berg: 42/56 5xSTS: 16.03 sec   MMT Right eval Left eval  Hip flexion 4+ 4+  Hip extension    Hip abduction 5 5  Hip adduction    Hip internal rotation 4+ 4+  Hip external rotation 4 4+  Knee flexion 5 5  Knee extension 5 5  Ankle dorsiflexion    Ankle plantarflexion    Ankle inversion    Ankle eversion     (Blank rows = not tested)                                                     TREATMENT DATE: 07/16/2024  Subjective:  Pt. Reports 6/10 NPS for the L knee, 8/10 NPS on the R knee today.  Pt. states that he believes there is less swelling on his right LE on this day as compared to the left. Pt. Also reports he was feeling very fatigued after last session.    There.act.:   Nu-Step B UE/LE level 3  resistance, 12 minutes (seat 10)- discussed pain symptoms/ last PT tx. Session  Alternating taps on 6 inch step in // bars, 2x30 sec.    Forward and lateral step overs over 6 inch hurdles in //-bars (4 laps each)  Seated ankle pumps, LAQs with 2# AW, seated marches, 20 reps each  Lateral walking in //-bars (4 laps down and back)  Discussed importance of daily walking/ activity/ HEP   Not Performed on this day:  Seated UBE: 12 min. F/b (maintaining 2 lights)- no Nustep today secondary to pt. Concerned about increase in B knee pain.    Nautilus: standing lat. Pull downs with wand (60#)- 25x2.   TRX squats: 25x2 (modified knee flexion/ range)    Walking in hallway 500 ft. with RW ft. with no rest break required. (Stopped due to increase in bilateral knee pain to a 6/10 on left and 8/10 on right)      PATIENT EDUCATION:  Education details: Discussed walking program/ home ex. Person educated: Patient Education method: Medical illustrator Education comprehension: verbalized understanding and returned demonstration  HOME EXERCISE PROGRAM: Standing 3-way hip ex./ walking program.  ASSESSMENT:  CLINICAL IMPRESSION: Pt. Presents to PT with 6/10 L knee pain and 8/10 R knee pain but was able to complete 10+ minutes on the Nu-Step with level 3 resistance without significant difficulty or increase in pain. Pt. limited in hip flexor strength and endurance capacity during taps on 6-inch step and forward hurdles but was able to complete both activities for the intended sets and reps. Pt. introduced to new activity on this day which was lateral stepping over 6 inch hurdles to further challenge functional glute med. endurance. Pt. was also noted to be using bilateral UE support as needed to maintain balance during forward and lateral hurdles. Pt. Continues to be limited by cardiovascular endurance requiring long duration seated and standing  rest breaks between each set and new activity. Pt.  did not report an increase in pain levels by the end of today's session. Pt. will continue to benefit from skilled PT intervention to address decreased cardiovascular and LE endurance, postural abnormalities, and B knee pain.   OBJECTIVE IMPAIRMENTS: Abnormal gait, cardiopulmonary status limiting activity, decreased activity tolerance, decreased balance, decreased coordination, decreased endurance, decreased mobility, difficulty walking, decreased ROM, decreased strength, hypomobility, impaired flexibility, impaired sensation, impaired UE functional use, improper body mechanics, postural dysfunction, obesity, and pain.   ACTIVITY LIMITATIONS: carrying, lifting, bending, standing, squatting, stairs, transfers, bed mobility, bathing, toileting, dressing, hygiene/grooming, and locomotion level  PARTICIPATION LIMITATIONS: meal prep, cleaning, driving, shopping, and community activity  PERSONAL FACTORS: Past/current experiences are also affecting patient's functional outcome.   REHAB POTENTIAL: Good  CLINICAL DECISION MAKING: Evolving/moderate complexity  EVALUATION COMPLEXITY: Moderate   GOALS: Goals reviewed with patient? Yes  SHORT TERM GOALS: Target date: 06/04/24 Pt. Will increase B hip flexion to >90 deg. In supine position to improve strength/ step length with gait.   Baseline:  see above.  7/15: to be assessed next visit, pt unable to attain supine position  Goal status: Partially met  2.  Pt. Will complete 5xSTS in <10 sec. To improve mobility/ decrease fall risk.   Baseline: 5 times sit to stand: 23.44 sec. (1st attempt), 12.67 sec. (2nd attempt). 7/15: 16.03 sec  Goal status: Not met   LONG TERM GOALS: Target date: 07/30/24  Pt. Independent with HEP to increase B LE muscle strength 1/2 muscle grade to improve mobility/ decrease fall risk.   Baseline: see above. 7/15: strength maintained or improved  Goal status: Partially met  2.  Pt. Will complete Berg balance test and score  >48/56 to decrease fall risk/ improve walking with least assistive device.   Baseline:  43/56,  7/15: 42/56 Goal status: Not met  3.  Pt. Will increase LEFS to >30 out of 80 to improve pain-free functional mobility.   Baseline: 18 out of 80. 7/15: 14/80  Goal status: Not met   4.  Pt. Able to ambulate 15 minutes with use of SPC and consistent step pattern on outside surfaces.   Baseline:  increase B knee pain/ limited endurance. 7/15: not able to test due to weather  Goal status: Not met  PLAN:  PT FREQUENCY: 1-2x/week  PT DURATION: 12 weeks  PLANNED INTERVENTIONS: 97750- Physical Performance Testing, 97110-Therapeutic exercises, 97530- Therapeutic activity, 97112- Neuromuscular re-education, 97535- Self Care, [id number]- Manual therapy, [phone]- Gait training, [phone]- Electrical stimulation (unattended), Patient/Family education, Balance training, Stair training, Joint mobilization, Spinal mobilization, Cryotherapy, and Moist heat  PLAN FOR NEXT SESSION:  Continue warming up on Nu-Step and resume hallway walking, potentially with obstacles, to improve gross strength and endurance of bilateral LEs.     Curtistine Bracket, SPT Ozell JAYSON Sero, PT, DPT # [phone] Physical Therapist - [facility] 07/16/2024, 2:42 PM

## 2024-07-17 ENCOUNTER — Ambulatory Visit: Payer: PRIVATE HEALTH INSURANCE | Admitting: Professional

## 2024-07-17 ENCOUNTER — Other Ambulatory Visit: Payer: Self-pay | Admitting: Family Medicine

## 2024-07-17 DIAGNOSIS — E785 Hyperlipidemia, unspecified: Secondary | ICD-10-CM

## 2024-07-18 ENCOUNTER — Ambulatory Visit: Admitting: Physical Therapy

## 2024-07-18 ENCOUNTER — Encounter (INDEPENDENT_AMBULATORY_CARE_PROVIDER_SITE_OTHER): Payer: Self-pay | Admitting: Nurse Practitioner

## 2024-07-18 ENCOUNTER — Ambulatory Visit (INDEPENDENT_AMBULATORY_CARE_PROVIDER_SITE_OTHER): Payer: Medicare Other | Admitting: Nurse Practitioner

## 2024-07-18 VITALS — BP 137/81 | HR 57 | Ht 70.0 in | Wt 292.0 lb

## 2024-07-18 DIAGNOSIS — M6281 Muscle weakness (generalized): Secondary | ICD-10-CM

## 2024-07-18 DIAGNOSIS — R2681 Unsteadiness on feet: Secondary | ICD-10-CM | POA: Diagnosis not present

## 2024-07-18 DIAGNOSIS — M172 Bilateral post-traumatic osteoarthritis of knee: Secondary | ICD-10-CM

## 2024-07-18 DIAGNOSIS — R269 Unspecified abnormalities of gait and mobility: Secondary | ICD-10-CM

## 2024-07-18 DIAGNOSIS — E785 Hyperlipidemia, unspecified: Secondary | ICD-10-CM

## 2024-07-18 DIAGNOSIS — M25662 Stiffness of left knee, not elsewhere classified: Secondary | ICD-10-CM | POA: Diagnosis not present

## 2024-07-18 DIAGNOSIS — I10 Essential (primary) hypertension: Secondary | ICD-10-CM | POA: Diagnosis not present

## 2024-07-18 DIAGNOSIS — I890 Lymphedema, not elsewhere classified: Secondary | ICD-10-CM | POA: Diagnosis not present

## 2024-07-18 DIAGNOSIS — R262 Difficulty in walking, not elsewhere classified: Secondary | ICD-10-CM

## 2024-07-18 DIAGNOSIS — R279 Unspecified lack of coordination: Secondary | ICD-10-CM | POA: Diagnosis not present

## 2024-07-18 DIAGNOSIS — M25661 Stiffness of right knee, not elsewhere classified: Secondary | ICD-10-CM | POA: Diagnosis not present

## 2024-07-18 NOTE — Therapy (Signed)
 OUTPATIENT PHYSICAL THERAPY LOWER EXTREMITY   Patient Name: Tom Underwood MRN: [id number] DOB:December 04, 1951, 73 y.o., male Today's Date: 07/18/2024  END OF SESSION:  PT End of Session - 07/18/24 1119     Visit Number 15    Number of Visits 24    Date for PT Re-Evaluation 07/30/24    PT Start Time 1120    PT Stop Time 1215    PT Time Calculation (min) 55 min    Equipment Utilized During Treatment Gait belt    Activity Tolerance Patient tolerated treatment well;Patient limited by fatigue    Behavior During Therapy WFL for tasks assessed/performed         Past Medical History:  Diagnosis Date   Anxiety    Arthritis    Atrial fibrillation (HCC)    a.) CHA2DS2-VASc = 4 (age, HTN, CVA x2). b.) s/p 200 J synchronized cardioversion (DCCV) on 12/08/2021. c.) rate/rythum maintained on oral metoprolol  tartrate; chronically anticoagulated using rivaroxaban .   BPH (benign prostatic hyperplasia)    DDD (degenerative disc disease), lumbar    Decreased libido    Depression    Diverticulitis    Diverticulosis    GERD (gastroesophageal reflux disease)    Hepatic steatosis    History of 2019 novel coronavirus disease (COVID-19) 12/28/2020   History of cardiac murmur in childhood    History of kidney stones    HLD (hyperlipidemia)    Hypertension    Hypogonadism in male    OSA on CPAP    Pre-diabetes    Rhinitis, allergic    Stroke (HCC) 07/23/2019   a.) LEFT posterior cor radiata. b.) residual RIGHT sided weakness   Past Surgical History:  Procedure Laterality Date   CARDIOVERSION N/A 12/08/2021   Procedure: CARDIOVERSION;  Surgeon: Mady Bruckner, MD;  Location: [facility] ORS;  Service: Cardiovascular;  Laterality: N/A;   COLONOSCOPY     COLONOSCOPY WITH PROPOFOL  N/A 10/06/2023   Procedure: COLONOSCOPY WITH PROPOFOL ;  Surgeon: Therisa Bi, MD;  Location: [facility] ENDOSCOPY;  Service: Gastroenterology;  Laterality: N/A;   EVALUATION UNDER ANESTHESIA WITH HEMORRHOIDECTOMY N/A 04/15/2022    Procedure: EXAM UNDER ANESTHESIA WITH HEMORRHOIDECTOMY;  Surgeon: Tye Millet, DO;  Location: [facility] ORS;  Service: General;  Laterality: N/A;   EXTERNAL EAR SURGERY     EYE SURGERY     FINGER SURGERY Right    lateration to 5 th digit   KNEE SURGERY     left eye     POLYPECTOMY  10/06/2023   Procedure: POLYPECTOMY;  Surgeon: Therisa Bi, MD;  Location: [facility] ENDOSCOPY;  Service: Gastroenterology;;   RECTAL EXAM UNDER ANESTHESIA N/A 04/23/2022   Procedure: RECTAL EXAM UNDER ANESTHESIA WITH LIGATION OF BLEEDING;  Surgeon: Desiderio Schanz, MD;  Location: [facility] ORS;  Service: General;  Laterality: N/A;   Patient Active Problem List   Diagnosis Date Noted   Chronic venous insufficiency of lower extremity 05/23/2024   History of atrial fibrillation 11/22/2023   Major depression, recurrent, chronic (HCC) 11/22/2023   Adenomatous polyp of colon 10/06/2023   Gastroesophageal reflux disease without esophagitis 05/17/2022   History of syncope 05/17/2022   History of hemorrhoidectomy 05/17/2022   Vitamin D  deficiency 05/17/2022   Lower extremity edema 05/17/2022   History of rectal bleeding 05/17/2022   Morbid obesity (HCC)    Hypertension    Hemiparesis of right dominant side as late effect of cerebral infarction (HCC) 08/13/2019   History of kidney stones 05/02/2018   ED (erectile dysfunction) 05/02/2018   Hyperglycemia 01/30/2017  Arthritis of knee, degenerative 01/30/2017   BPH with obstruction/lower urinary tract symptoms 02/03/2016   Dyslipidemia 08/04/2015   OSA on CPAP 06/30/2015    PCP: Glenard Mire, MD  REFERRING PROVIDER: Sowles, Krichna, MD  REFERRING DIAG:  [phone] (ICD-10-CM) - Hemiparesis of right dominant side as late effect of cerebral infarction (HCC)  R26.81 (ICD-10-CM) - Gait instability    THERAPY DIAG:  Muscle weakness (generalized)  Abnormality of gait and mobility  Joint stiffness of both knees  Unsteadiness on feet  Difficulty in walking, not elsewhere  classified  Rationale for Evaluation and Treatment: Rehabilitation  ONSET DATE: chronic  SUBJECTIVE:   SUBJECTIVE STATEMENT: Pt. Reports <5/10 B knee pain walking around PT clinic.  Pt. States he has been walking but limited by knee pain/ endurance.  Pt. Referred back to PT by MD since discharge from PT a few months ago due to regression in LE strength/ endurance.  Pt. Had a R sided stroke on 08/23/2019 and lives with wife and uses [facility] for safety with walking.  Pt. Fearful with walking on varying terrain/ curbs.  No recent falls reported.    PERTINENT HISTORY: Pt. Known well to PT clinic.    PAIN:  Are you having pain? Yes: NPRS scale: 4-5/10  Pain location: B knees Pain description: achy/ persistent Aggravating factors: walking/ step ups Relieving factors: rest  PRECAUTIONS: Fall  RED FLAGS: None   WEIGHT BEARING RESTRICTIONS: No  FALLS:  Has patient fallen in last 6 months? No  LIVING ENVIRONMENT: Lives with: lives with their spouse Lives in: Mobile home Stairs: Yes: External: 5 steps; can reach both Has following equipment at home: Single point cane  OCCUPATION: Retired  PLOF: Independent with household mobility with device  PATIENT GOALS: Increase R sided muscle strength/ walking endurance/ balance.    NEXT MD VISIT: PRN  OBJECTIVE:  Note: Objective measures were completed at Evaluation unless otherwise noted.  PATIENT SURVEYS:  LEFS: 18 out of 80.    COGNITION: Overall cognitive status: Within functional limits for tasks assessed     SENSATION: Light touch: Impaired   EDEMA:  Moderate B lower leg swelling.  Pt. Has compression stockings/ lymphedema pumps.  Marked improvement in lower leg swelling as compared to last PT tx. Session.     MUSCLE LENGTH: Hamstrings: NT Thomas test: Unable to test due to limited supine positioning  POSTURE: rounded shoulders, forward head, and flexed trunk   PALPATION: (+) B knee joint line and lower leg tenderness.   Pitting edema noted in B lower legs (slightly improved from last PT tx. Session several months ago).    LOWER EXTREMITY ROM:  Active ROM Right eval Left eval  Hip flexion <90 deg. <90 deg.  Hip extension    Hip abduction    Hip adduction    Hip internal rotation    Hip external rotation    Knee flexion    Knee extension    Ankle dorsiflexion    Ankle plantarflexion    Ankle inversion    Ankle eversion     (Blank rows = not tested)  LOWER EXTREMITY MMT:  MMT Right eval Left eval  Hip flexion 4- 4  Hip extension    Hip abduction 4 4+  Hip adduction    Hip internal rotation 4+ 4+  Hip external rotation 4 4  Knee flexion 5 5  Knee extension 5 5  Ankle dorsiflexion    Ankle plantarflexion    Ankle inversion    Ankle eversion     (  Blank rows = not tested)  LOWER EXTREMITY SPECIAL TESTS:  NT  FUNCTIONAL TESTS:  5 times sit to stand: 23.44 sec. (1st attempt), 12.67 sec. (2nd attempt).   Berg Balance Scale: TBD  GAIT: Distance walked: in clinic/ outside on sidewalk Assistive device utilized: Single point cane Level of assistance: Modified independence Comments: slow gait pattern with decreased arm swing, heel strike, reciprocal stride length. R foot ER.  No LOB during tx. Session.  Several seated rest breaks (vital WNL)  LEFS: 14/80  Berg: 42/56 5xSTS: 16.03 sec   MMT Right eval Left eval  Hip flexion 4+ 4+  Hip extension    Hip abduction 5 5  Hip adduction    Hip internal rotation 4+ 4+  Hip external rotation 4 4+  Knee flexion 5 5  Knee extension 5 5  Ankle dorsiflexion    Ankle plantarflexion    Ankle inversion    Ankle eversion     (Blank rows = not tested)                                                     TREATMENT DATE: 07/18/2024  Subjective:  Pt. Reports 5/10 NPS for the L knee, 5/10 NPS on the R knee today. Pt. also reports he was very tired when I got home after last session.  Pt. Has f/u with vascular medicine after PT tx. Today to  reassess LE swelling.    There.act.:   Nu-Step B UE/LE level 3 resistance, 11 minutes (seat 10)- discussed pain symptoms/ last PT tx. Session/ upcoming MD f/u.  Figure-8 Walking through cones in hallway with cone obstacles ~60 ft. with RW with no rest break required. (CGA)  Figure-8 Walking through cones in hallway with cone obstacles without use of AD ~190 ft with standing rest break (CGA)   Standing marches during ambulation with RW, ~68 ft. With no rest break required. (CGA)  Seated ankle pumps, LAQs, 20 reps each   Discussed importance of daily walking/ activity/ HEP  Not Performed on this day:  Alternating taps on 6 inch step in // bars, 2x30 sec.    Forward and lateral step overs over 6 inch hurdles in //-bars (4 laps each)  Seated UBE: 12 min. F/b (maintaining 2 lights)- no Nustep today secondary to pt. Concerned about increase in B knee pain.    Nautilus: standing lat. Pull downs with wand (60#)- 25x2.   TRX squats: 25x2 (modified knee flexion/ range)    Lateral walking in //-bars (4 laps down and back)    PATIENT EDUCATION:  Education details: Discussed walking program/ home ex. Person educated: Patient Education method: Medical illustrator Education comprehension: verbalized understanding and returned demonstration  HOME EXERCISE PROGRAM: Standing 3-way hip ex./ walking program.  ASSESSMENT:  CLINICAL IMPRESSION: Pt. Presents to PT with 5/10 L knee pain and 5/10 R knee pain but was able to complete 10+ minutes on the Nu-Step with level 3 resistance without significant difficulty or increase in pain. Pt. With O2 saturation recorded at 96% and HR at 60 bpm prior to walking activities with no change in O2 saturation throughout tx. session and HR recorded at it's highest at 76 bpm. Pt. Able to ambulate in hallway through cone obstacles without the use of RW. Pt. ambulated with CGA for safety and balance and with UEs in  high guard position, decreased right arm  swing, wide BOS, and a decreased step length bilaterally. Pt. Did not have any LOBs during ambulation activities and no increase in bilateral knee pain at end of tx. session. Pt. Continues to require long duration seated rest breaks in between each exercise in order to recover from SOB and general fatigue. Pt. will continue to benefit from skilled PT intervention to address decreased cardiovascular and LE endurance, and bilateral knee pain.   OBJECTIVE IMPAIRMENTS: Abnormal gait, cardiopulmonary status limiting activity, decreased activity tolerance, decreased balance, decreased coordination, decreased endurance, decreased mobility, difficulty walking, decreased ROM, decreased strength, hypomobility, impaired flexibility, impaired sensation, impaired UE functional use, improper body mechanics, postural dysfunction, obesity, and pain.   ACTIVITY LIMITATIONS: carrying, lifting, bending, standing, squatting, stairs, transfers, bed mobility, bathing, toileting, dressing, hygiene/grooming, and locomotion level  PARTICIPATION LIMITATIONS: meal prep, cleaning, driving, shopping, and community activity  PERSONAL FACTORS: Past/current experiences are also affecting patient's functional outcome.   REHAB POTENTIAL: Good  CLINICAL DECISION MAKING: Evolving/moderate complexity  EVALUATION COMPLEXITY: Moderate   GOALS: Goals reviewed with patient? Yes  SHORT TERM GOALS: Target date: 06/04/24 Pt. Will increase B hip flexion to >90 deg. In supine position to improve strength/ step length with gait.   Baseline:  see above.  7/15: to be assessed next visit, pt unable to attain supine position  Goal status: Partially met  2.  Pt. Will complete 5xSTS in <10 sec. To improve mobility/ decrease fall risk.   Baseline: 5 times sit to stand: 23.44 sec. (1st attempt), 12.67 sec. (2nd attempt). 7/15: 16.03 sec  Goal status: Not met   LONG TERM GOALS: Target date: 07/30/24  Pt. Independent with HEP to increase B LE  muscle strength 1/2 muscle grade to improve mobility/ decrease fall risk.   Baseline: see above. 7/15: strength maintained or improved  Goal status: Partially met  2.  Pt. Will complete Berg balance test and score >48/56 to decrease fall risk/ improve walking with least assistive device.   Baseline:  43/56,  7/15: 42/56 Goal status: Not met  3.  Pt. Will increase LEFS to >30 out of 80 to improve pain-free functional mobility.   Baseline: 18 out of 80. 7/15: 14/80  Goal status: Not met   4.  Pt. Able to ambulate 15 minutes with use of SPC and consistent step pattern on outside surfaces.   Baseline:  increase B knee pain/ limited endurance. 7/15: not able to test due to weather  Goal status: Not met  PLAN:  PT FREQUENCY: 1-2x/week  PT DURATION: 12 weeks  PLANNED INTERVENTIONS: 97750- Physical Performance Testing, 97110-Therapeutic exercises, 97530- Therapeutic activity, 97112- Neuromuscular re-education, 97535- Self Care, [id number]- Manual therapy, [phone]- Gait training, [phone]- Electrical stimulation (unattended), Patient/Family education, Balance training, Stair training, Joint mobilization, Spinal mobilization, Cryotherapy, and Moist heat  PLAN FOR NEXT SESSION:  Continue to progress gross strength and endurance of bilateral LEs with dynamic standing and ambulation activities.    Curtistine Bracket, SPT Tom Underwood, PT, DPT # [phone] Physical Therapist - [facility] 07/18/2024, 1:15 PM

## 2024-07-19 ENCOUNTER — Encounter: Payer: Self-pay | Admitting: Professional

## 2024-07-19 ENCOUNTER — Ambulatory Visit (INDEPENDENT_AMBULATORY_CARE_PROVIDER_SITE_OTHER): Payer: PRIVATE HEALTH INSURANCE | Admitting: Professional

## 2024-07-19 DIAGNOSIS — F411 Generalized anxiety disorder: Secondary | ICD-10-CM

## 2024-07-19 DIAGNOSIS — F320 Major depressive disorder, single episode, mild: Secondary | ICD-10-CM

## 2024-07-19 NOTE — Progress Notes (Signed)
 [facility] Counselor/Therapist Progress Note  Patient ID: Tom Underwood, MRN: [id number],    Date: 07/19/2024  Time Spent: 50 minutes 1101-1151am   Treatment Type: Individual Therapy  Risk Assessment: Danger to Self:  No Self-injurious Behavior: No Danger to Others: No Duty to Warn:no  Subjective: This session was held via video teletherapy. The patient consented to video teletherapy and was located at his home during this session. He is aware it is the responsibility of the patient to secure confidentiality on her end of the session. The provider was in a private home office for the duration of this session.    The patient arrived late for his Caregility appointment.   Issues addressed: 1-homework -[place] to [facility] exit and take the back way home before next session -he completed and said it went well -he admits that he felt anxious on his way to [place] -after getting on the interstate and off at the next exit and states he felt relief -a little down the road 2-mood -pleasant and bright 3-stress -causes him to feel anxious -he admits that he needs to have a more positive attitude 4-PT -continues to do well -is knees are showing good improvement 5-driving -desensitization of driving on interstate -[address] exit at [employer] -drive rural roads to [employer], get on and travel one exit to [employer], celebrate the accomplishment and then get on interstate at [facility], go back to [employer] and drive home the rural roads -pt said he thinks he can and then after some thought said I think it's too much -pt said he thinks he would prefer to get on an exit near where he lives and get off next exit [place] to [facility] [employer] -[name] feels annoyed with pt with using the back roads all the time -pt encouraged  Treatment Plan Problems: Anxiety, Unipolar Depression, Posttraumatic Stress Disorder (PTSD), Grief / Loss  Unresolved,  Symptoms: Excessive and/or unrealistic worry that is difficult to control occurring more days than not for at least 6 months about a number of events or activities. Motor tension (e.g., restlessness, tiredness, shakiness, muscle tension). Hypervigilance (e.g., feeling constantly on edge, experiencing concentration difficulties, having trouble falling or staying asleep, exhibiting a general state of irritability). Thoughts dominated by loss coupled with poor concentration, tearful spells, and confusion about the future. Serial losses in life (i.e., deaths, divorces, jobs) that led to depression and discouragement. Strong emotional response of sadness exhibited when losses are discussed. Lack of appetite, weight loss, and/or insomnia as well as other depression signs that occurred since the loss. Has been exposed to a traumatic event involving actual or perceived threat of death or serious injury. Reports response of intense fear, helplessness, or horror to the traumatic event. Experiences disturbing and persistent thoughts, images, and/or perceptions of the traumatic event. Displays significant psychological and/or physiological distress resulting from internal and external clues that are reminiscent of the traumatic event. Intentionally avoids thoughts, feelings, or discussions related to the traumatic event. Intentionally avoids activities, places, people, or objects (e.g., up-armored vehicles) that evoke memories of the event. Displays a significant decline in interest and engagement in activities. Experiences disturbances in sleep. Reports difficulty concentrating as well as feelings of guilt. Reports hypervigilance. Symptoms present more than one month. Impairment in social, occupational, or other areas of functioning. Depressed or irritable mood. Decrease or loss of appetite. Diminished interest in or enjoyment of activities. Sleeplessness or hypersomnia. Lack of  energy. Poor concentration and indecisiveness.  Social withdrawal. Feelings of hopelessness, worthlessness, or inappropriate guilt. Unresolved grief issues. History of chronic or recurrent depression for which the client has taken antidepressant medication, been hospitalized, had outpatient treatment, or had a course of electroconvulsive therapy. Goals: Learn and implement coping skills that result in a reduction of anxiety and worry, and improved daily functioning. Enhance ability to effectively cope with the full variety of life's worries and anxieties. Stabilize anxiety level while increasing ability to function on a daily basis. Begin a healthy grieving process around the loss. Develop an awareness of how the avoidance of grieving has affected life and begin the healing process. Resolve the loss, reengaging in old relationships and initiating new contacts with others. Eliminate or reduce the negative impact trauma related symptoms have on social, occupational, and family functioning. Thinks about or openly discusses the traumatic event with others without experiencing psychological or physiological distress. No longer avoids persons, places, activities, and objects that are reminiscent of the traumatic event. Alleviate depressive symptoms and return to previous level of effective functioning. Recognize, accept, and cope with feelings of depression. Develop healthy thinking patterns and beliefs about self, others, and the world that lead to the alleviation and help prevent the relapse of depression. Appropriately grieve the loss in order to normalize mood and to return to previously adaptive level of functioning. Objectives target date for all objectives is 11/19/2024 Describe situations, thoughts, feelings, and actions associated with anxieties and worries, their impact on functioning, and attempts to resolve them.   50% Verbalize an understanding of the cognitive, physiological, and  behavioral components of anxiety and its treatment.   40% Learn and implement calming skills to reduce overall anxiety and manage anxiety symptoms.  30% Learn and implement a strategy to limit the association between various environmental settings and worry, delaying the worry until a designated worry time.   20% Learn and implement problem-solving strategies for realistically addressing worries.   40% Identify, challenge, and replace biased, fearful self-talk with positive, realistic, and empowering self-talk.   65% Verbalize an understanding of the role that cognitive biases play in excessive irrational worry and persistent anxiety symptoms.   65% Identify and engage in pleasant activities on a daily basis.  65% Learn and implement relapse prevention strategies for managing possible future anxiety symptoms.   40% Learn to accept limitations in life and commit to tolerating, rather than avoiding, unpleasant emotions while accomplishing meaningful goals.  40% Tell in detail the story of the current loss that is triggering symptoms.   100% Identify what stages of grief have been experienced in the continuum of the grieving process.   40% (has not grieved mother's death) Begin verbalizing feelings associated with the loss.   20% Verbalize resolution of feelings of guilt and regret associated with the loss. 30% Identify and voice positives about the deceased loved one including previous positive experiences, positive characteristics, positive aspects of the relationship, and how these things may be remembered.   10% Learn and implement calming skills.   50% Learn and implement guided self-dialogue to manage thoughts, feelings, and urges brought on by encounters with trauma-related situations.   30% Acknowledge the need to implement anger control techniques; learn and implement anger management techniques.   30% Implement a regular exercise regimen as a stress release technique.   50% Describe  current and past experiences with depression including their impact on functioning and attempts to resolve it.   70% Verbalize an accurate understanding of depression.   70% Identify and  replace thoughts and beliefs that support depression.   40% Learn and implement behavioral strategies to overcome depression.   60% Identify important people in life, past and present, and describe the quality, good and poor, of those relationships.   30% Learn and implement problem-solving and decision-making skills.   30% Learn and implement conflict resolution skills to resolve interpersonal problems.   30% Interventions: Use empathy, compassion, and support, allowing the client to tell in detail the story of his/her recent loss. Assign the client to make a list of all the regrets associated with actions toward or relationship with the deceased; process the list content toward resolution of these feelings. Assist the client in engaging in behaviors that celebrate the positive memorable aspects of the loved one and his/her life (e.g., placing memoriam in newspaper on anniversary of death, volunteering time to a favorite cause of the deceased person). Ask the client to discuss and/or list the positive aspects of and memories about his/her relationship with the lost loved one; reinforce the client's expression of positive memories and emotions (e.g., smiling, laughing); encourage the client to share these thoughts with supportive loved ones. Educate the client on the stages of the grieving process and answer any questions he/she may have. Assist the client in identifying the stages of grief that he/she has experienced and which stage he/she is presently working through. Ask the client to bring pictures or mementos connected with his/her loss to a session and talk about them (or assign Creating a Patent examiner in the Adult Psychotherapy Administrator, arts by Jenniffer). Assist the client in identifying and expressing  feelings connected with his/her loss. Gently and sensitively explore the client's recollection of the facts of the traumatic incident and his/her cognitive and emotional reactions at the time; assess frequency, intensity, duration, and history of the client's PTSD symptoms and their impact on functioning (see How the Trauma Affects Me in the Adult Psychotherapy Homework Planner by Jongsma); supplement with semi-structured assessment instrument if desired (see The Anxiety Disorders Interview Schedule - Adult Version). Using Cognitive Therapy techniques, explore the client's self-talk and beliefs about self, others, and the future that are a consequence of the trauma (e.g., themes of safety, trust, power, control, esteem, and intimacy); identify and challenge biases; assist him/her in generating appraisals that correct for the biases; test biased and alternatives predictions through behavioral experiments. Teach the client a guided self-dialogue procedure in which he/she learns to recognize maladaptive self-talk, challenges its biases, copes with engendered feelings, overcomes avoidance, and reinforces his/her accomplishments; review and reinforce progress, problem-solve obstacles. Utilize Eye Movement Desensitization and Reprocessing (EMDR) to reduce the client's emotional reactivity to the traumatic event and reduce PTSD symptoms. Assess the client for instances of poor anger management that have led to threats or actual violence that caused damage to property and/or injury to people (or assign Anger Journal in the Adult Psychotherapy Homework Planner by Jenniffer). Teach the client anger management techniques (see the Anger Control Problems chapter in this Planner). Develop and encourage a routine of physical exercise for the client. Teach the client calming skills (e.g., breathing retraining, relaxation, calming self-talk) to use in and between sessions when feeling overly distressed. Ask the client to  describe his/her past experiences of anxiety and their impact on functioning; assess the focus, excessiveness, and uncontrollability of the worry and the type, frequency, intensity, and duration of his/her anxiety symptoms (consider using a structured interview such as The Anxiety Disorders Interview Schedule-Adult Version). Explore the client's schema and self-talk that  mediate his/her fear response; assist him/her in challenging the biases; replace the distorted messages with reality-based alternatives and positive, realistic self-talk that will increase his/her self-confidence in coping with irrational fears (see Cognitive Therapy of Anxiety Disorders by Gretta armin Mon). Teach the client problem-solving strategies involving specifically defining a problem, generating options for addressing it, evaluating the pros and cons of each option, selecting and implementing an optional action, and reevaluating and refining the action (or assign Applying Problem-Solving to Interpersonal Conflict in the Adult Psychotherapy Homework Planner by Jenniffer). Engage the client in behavioral activation, increasing the client's contact with sources of reward, identifying processes that inhibit activation, and teaching skills to solve life problems (or assign Identify and Schedule Pleasant Activities in the Adult Psychotherapy Homework Planner by Jenniffer); use behavioral techniques such as instruction, rehearsal, role-playing, role reversal as needed to assist adoption into the client's daily life; reinforce success. Discuss with the client the distinction between a lapse and relapse, associating a lapse with an initial and reversible return of worry, anxiety symptoms, or urges to avoid, and relapse with the decision to continue the fearful and avoidant patterns. Identify and rehearse with the client the management of future situations or circumstances in which lapses could occur. Instruct the client to routinely use new  therapeutic skills (e.g., relaxation, cognitive restructuring, exposure, and problem-solving) in daily life to address emergent worries, anxiety, and avoidant tendencies. Develop a coping card on which coping strategies and other important information (e.g., Breathe deeply and relax, Challenge unrealistic worries, Use problem-solving) are written for the client's later use. Use techniques from Acceptance and Commitment Therapy to help client accept uncomfortable realities such as lack of complete control, imperfections, and uncertainty and tolerate unpleasant emotions and thoughts in order to accomplish value-consistent goals. Discuss how generalized anxiety typically involves excessive worry about unrealistic threats, various bodily expressions of tension, overarousal, and hypervigilance, and avoidance of what is threatening that interact to maintain the problem (see Mastery of Your Anxiety and Worry: Therapist Guide by Venson River, and Barlow; Treating Generalized Anxiety Disorder by Rygh and Red). Teach the client calming/relaxation skills (e.g., applied relaxation, progressive muscle relaxation, cue controlled relaxation; mindful breathing; biofeedback) and how to discriminate better between relaxation and tension; teach the client how to apply these skills to his/her daily life (e.g., New Directions in Progressive Muscle Relaxation by Thornell Collier, and Hazlett-Stevens; Treating Generalized Anxiety Disorder by Rygh and Red). Assign the client to read about progressive muscle relaxation and other calming strategies in relevant books or treatment manuals (e.g., Progressive Relaxation Training by Thornell and Collier; Mastery of Your Anxiety and Worry: Workbook by River armin Given). Explain the rationale for using a worry time as well as how it is to be used; agree upon and implement a worry time with the client. Teach the client how to recognize, stop, and postpone worry to  the agreed upon worry time using skills such as thought stopping, relaxation, and redirecting attention (or assign Making Use of the Thought-Stopping Technique and/or Worry Time in the Adult Psychotherapy Homework Planner by Jongsma to assist skill development); encourage use in daily life; review and reinforce success while providing corrective feedback toward improvement. Assist the client in analyzing his/her worries by examining potential biases such as the probability of the negative expectation occurring, the real consequences of it occurring, his/her ability to control the outcome, the worst possible outcome, and his/her ability to accept it (see Analyze the Probability of a Feared Event in the Adult Psychotherapy Homework Planner  by Jenniffer; Cognitive Therapy of Anxiety Disorders by Gretta armin Mon). Encourage the client to share his/her thoughts and feelings of depression; express empathy and build rapport while identifying primary cognitive, behavioral, interpersonal, or other contributors to depression. Conduct Cognitive-Behavioral Therapy (see Cognitive Behavior Therapy by Mon; Overcoming Depression by Marine dunker al.), beginning with helping the client learn the connection among cognition, depressive feelings, and actions. Assign the client to self-monitor thoughts, feelings, and actions in daily journal (e.g., Negative Thoughts Trigger Negative Feelings in the Adult Psychotherapy Homework Planner by Jenniffer; Daily Record of Dysfunctional Thoughts in Cognitive Therapy of Depression by Mon Candida Gentry and Shona); process the journal material to challenge depressive thinking patterns and replace them with reality-based thoughts. Facilitate and reinforce the client's shift from biased depressive self-talk and beliefs to reality-based cognitive messages that enhance self-confidence and increase adaptive actions (see Positive Self-Talk in the Adult Psychotherapy Homework Planner by  Jenniffer). Assist the client in developing skills that increase the likelihood of deriving pleasure from behavioral activation (e.g., assertiveness skills, developing an exercise plan, less internal/more external focus, increased social involvement); reinforce success. Conduct Interpersonal Therapy (see Interpersonal Psychotherapy of Depression by Anne dunker al.), beginning with the assessment of the client's interpersonal inventory of important past and present relationships; develop a case formulation linking depression to grief, interpersonal role disputes, role transitions, and/or interpersonal deficits). Conduct Problem-Solving Therapy (see Problem-Solving Therapy by Francisco and Nezu) using techniques such as psychoeducation, modeling, and role-playing to teach client problem-solving skills (i.e., defining a problem specifically, generating possible solutions, evaluating the pros and cons of each solution, selecting and implementing a plan of action, evaluating the efficacy of the plan, accepting or revising the plan); role-play application of the problem-solving skill to a real life issue (or assign Applying Problem-Solving to Interpersonal Conflict in the Adult Psychotherapy Homework Planner by Jenniffer). Teach conflict resolution skills (e.g., empathy, active listening, I messages, respectful communication, assertiveness without aggression, compromise); use psychoeducation, modeling, role-playing, and rehearsal to work through several current conflicts; assign homework exercises; review and repeat so as to integrate their use into the client's life. Consistent with the treatment model, discuss how cognitive, behavioral, interpersonal, and/or other factors (e.g., family history) contribute to depression.  Diagnosis:Generalized anxiety disorder  Current mild episode of major depressive disorder without prior episode [facility])  Plan:  -drive on interstate from [place] to [place] every other day  until next session -put post it note I can do it on the dashboard -begin using interstate to get to appointments -meet again Wednesday, July 31, 2024 at 10am virtually.

## 2024-07-19 NOTE — Progress Notes (Signed)
   Nathanel Collet, [facility]

## 2024-07-22 ENCOUNTER — Encounter (INDEPENDENT_AMBULATORY_CARE_PROVIDER_SITE_OTHER): Payer: Self-pay | Admitting: Nurse Practitioner

## 2024-07-22 DIAGNOSIS — Z85820 Personal history of malignant melanoma of skin: Secondary | ICD-10-CM | POA: Diagnosis not present

## 2024-07-22 DIAGNOSIS — D2272 Melanocytic nevi of left lower limb, including hip: Secondary | ICD-10-CM | POA: Diagnosis not present

## 2024-07-22 DIAGNOSIS — D225 Melanocytic nevi of trunk: Secondary | ICD-10-CM | POA: Diagnosis not present

## 2024-07-22 DIAGNOSIS — D2271 Melanocytic nevi of right lower limb, including hip: Secondary | ICD-10-CM | POA: Diagnosis not present

## 2024-07-22 DIAGNOSIS — D2261 Melanocytic nevi of right upper limb, including shoulder: Secondary | ICD-10-CM | POA: Diagnosis not present

## 2024-07-22 DIAGNOSIS — Z85828 Personal history of other malignant neoplasm of skin: Secondary | ICD-10-CM | POA: Diagnosis not present

## 2024-07-22 DIAGNOSIS — Z86006 Personal history of melanoma in-situ: Secondary | ICD-10-CM | POA: Diagnosis not present

## 2024-07-22 DIAGNOSIS — D2262 Melanocytic nevi of left upper limb, including shoulder: Secondary | ICD-10-CM | POA: Diagnosis not present

## 2024-07-22 NOTE — Progress Notes (Signed)
 Subjective:    Patient ID: Tom Underwood, male    DOB: 1951-12-02, 73 y.o.   MRN: [id number] Chief Complaint  Patient presents with   Follow-up    Follow up 6 month having pain in both knees     The patient returns to the office for followup evaluation regarding leg swelling.  The swelling has persisted but with the lymph pump is under much, much better controlled. The pain associated with swelling is decreased. There have not been any interval development of a ulcerations or wounds.  The patient denies problems with the pump, noting it is working well and the leggings are in good condition.  Since the previous visit the patient has been wearing graduated compression stockings and using the lymph pump on a routine basis and  has noted significant improvement in the lymphedema.   Patient stated the lymph pump has been helpful with the treatment of the lymphedema.      Review of Systems  Cardiovascular:  Positive for leg swelling.  Musculoskeletal:  Positive for arthralgias.  All other systems reviewed and are negative.      Objective:   Physical Exam Vitals reviewed.  HENT:     Head: Normocephalic.  Cardiovascular:     Rate and Rhythm: Normal rate.  Pulmonary:     Effort: Pulmonary effort is normal.  Skin:    General: Skin is warm and dry.  Neurological:     Mental Status: He is alert and oriented to person, place, and time.     Motor: Weakness present.     Gait: Gait abnormal.  Psychiatric:        Mood and Affect: Mood normal.        Behavior: Behavior normal.        Thought Content: Thought content normal.        Judgment: Judgment normal.     BP 137/81   Pulse (!) 57   Ht 5' 10 (1.778 m)   Wt 292 lb (132.5 kg)   BMI 41.90 kg/m   Past Medical History:  Diagnosis Date   Anxiety    Arthritis    Atrial fibrillation (HCC)    a.) CHA2DS2-VASc = 4 (age, HTN, CVA x2). b.) s/p 200 J synchronized cardioversion (DCCV) on 12/08/2021. c.) rate/rythum  maintained on oral metoprolol  tartrate; chronically anticoagulated using rivaroxaban .   BPH (benign prostatic hyperplasia)    DDD (degenerative disc disease), lumbar    Decreased libido    Depression    Diverticulitis    Diverticulosis    GERD (gastroesophageal reflux disease)    Hepatic steatosis    History of 2019 novel coronavirus disease (COVID-19) 12/28/2020   History of cardiac murmur in childhood    History of kidney stones    HLD (hyperlipidemia)    Hypertension    Hypogonadism in male    OSA on CPAP    Pre-diabetes    Rhinitis, allergic    Stroke (HCC) 07/23/2019   a.) LEFT posterior cor radiata. b.) residual RIGHT sided weakness    Social History   Socioeconomic History   Marital status: Married    Spouse name: Arland    Number of children: 2   Years of education: Not on file   Highest education level: Not on file  Occupational History   Occupation: retired   Tobacco Use   Smoking status: Former    Types: Pipe    Start date: 02/20/1973    Quit date: 02/20/1974  Years since quitting: 50.4   Smokeless tobacco: Never  Vaping Use   Vaping status: Never Used  Substance and Sexual Activity   Alcohol use: No    Alcohol/week: 0.0 standard drinks of alcohol   Drug use: No   Sexual activity: Yes    Partners: Female  Other Topics Concern   Not on file  Social History Narrative   Not on file   Social Drivers of Health   Financial Resource Strain: Low Risk  (05/30/2024)   Overall Financial Resource Strain (CARDIA)    Difficulty of Paying Living Expenses: Not hard at all  Food Insecurity: No Food Insecurity (05/30/2024)   Hunger Vital Sign    Worried About Running Out of Food in the Last Year: Never true    Ran Out of Food in the Last Year: Never true  Transportation Needs: No Transportation Needs (05/30/2024)   PRAPARE - Administrator, Civil Service (Medical): No    Lack of Transportation (Non-Medical): No  Physical Activity: Insufficiently Active  (05/30/2024)   Exercise Vital Sign    Days of Exercise per Week: 7 days    Minutes of Exercise per Session: 10 min  Stress: No Stress Concern Present (05/30/2024)   [employer] of Occupational Health - Occupational Stress Questionnaire    Feeling of Stress: Not at all  Social Connections: Moderately Integrated (05/30/2024)   Social Connection and Isolation Panel    Frequency of Communication with Friends and Family: Three times a week    Frequency of Social Gatherings with Friends and Family: Never    Attends Religious Services: Never    Database administrator or Organizations: Yes    Attends Engineer, structural: More than 4 times per year    Marital Status: Married  Catering manager Violence: Not At Risk (05/30/2024)   Humiliation, Afraid, Rape, and Kick questionnaire    Fear of Current or Ex-Partner: No    Emotionally Abused: No    Physically Abused: No    Sexually Abused: No    Past Surgical History:  Procedure Laterality Date   CARDIOVERSION N/A 12/08/2021   Procedure: CARDIOVERSION;  Surgeon: Mady Bruckner, MD;  Location: [facility] ORS;  Service: Cardiovascular;  Laterality: N/A;   COLONOSCOPY     COLONOSCOPY WITH PROPOFOL  N/A 10/06/2023   Procedure: COLONOSCOPY WITH PROPOFOL ;  Surgeon: Therisa Bi, MD;  Location: [facility] ENDOSCOPY;  Service: Gastroenterology;  Laterality: N/A;   EVALUATION UNDER ANESTHESIA WITH HEMORRHOIDECTOMY N/A 04/15/2022   Procedure: EXAM UNDER ANESTHESIA WITH HEMORRHOIDECTOMY;  Surgeon: Tye Millet, DO;  Location: [facility] ORS;  Service: General;  Laterality: N/A;   EXTERNAL EAR SURGERY     EYE SURGERY     FINGER SURGERY Right    lateration to 5 th digit   KNEE SURGERY     left eye     POLYPECTOMY  10/06/2023   Procedure: POLYPECTOMY;  Surgeon: Therisa Bi, MD;  [facility];  Service: Gastroenterology;;   RECTAL EXAM UNDER ANESTHESIA N/A 04/23/2022   Procedure: RECTAL EXAM UNDER ANESTHESIA WITH LIGATION OF BLEEDING;  Surgeon: Desiderio Schanz, MD;  Location: [facility] ORS;  Service: General;  Laterality: N/A;    Family History  Problem Relation Age of Onset   Asthma Mother    Heart disease Mother    Heart attack Mother    Dementia Mother    Asthma Father    Heart disease Father    Stroke Father     Allergies  Allergen Reactions  Penicillins Itching       Latest Ref Rng & Units 06/30/2023   11:38 AM 07/01/2022   11:18 AM 04/26/2022    9:40 AM  CBC  WBC 3.4 - 10.8 x10E3/uL 5.9  5.2  8.1   Hemoglobin 13.0 - 17.7 g/dL 83.9  85.5  88.8   Hematocrit 37.5 - 51.0 % 46.3  42.1  31.4   Platelets 150 - 450 x10E3/uL 205  219  252       CMP     Component Value Date/Time   NA 142 06/30/2023 1138   K 3.8 06/30/2023 1138   CL 102 06/30/2023 1138   CO2 23 06/30/2023 1138   GLUCOSE 100 (H) 06/30/2023 1138   GLUCOSE 139 (H) 04/24/2022 0517   BUN 12 06/30/2023 1138   CREATININE 1.08 06/30/2023 1138   CALCIUM  9.7 06/30/2023 1138   PROT 7.2 06/30/2023 1138   ALBUMIN 4.7 06/30/2023 1138   AST 24 06/30/2023 1138   ALT 23 06/30/2023 1138   ALKPHOS 122 (H) 06/30/2023 1138   BILITOT 1.3 (H) 06/30/2023 1138   EGFR 74 06/30/2023 1138   GFRNONAA >60 04/24/2022 0517     No results found.     Assessment & Plan:   1. Lymphedema (Primary) Recommend:  No surgery or intervention at this point in time.    I have reviewed my discussion with the patient regarding lymphedema and why it  causes symptoms.  Patient will continue wearing graduated compression on a daily basis. The patient should put the compression on first thing in the morning and removing them in the evening. The patient should not sleep in the compression.   In addition, behavioral modification throughout the day will be continued.  This will include frequent elevation (such as in a recliner), use of over the counter pain medications as needed and exercise such as walking.  The systemic causes for chronic edema such as liver, kidney and cardiac etiologies does  not appear to have significant changed over the past year.    The patient will continue aggressive use of the  lymph pump.  This will continue to improve the edema control and prevent sequela such as ulcers and infections.   The patient will follow-up with me on an annual basis.   2. Post-traumatic osteoarthritis of both knees Patient is having some difficulty and discomfort in his knees.  The inflammation from arthritis is likely also contributing to his swelling.  He has previously had knee injections that were helpful.  We will send referral to his previous orthopedic specialist. 3. Primary hypertension Continue antihypertensive medications as already ordered, these medications have been reviewed and there are no changes at this time.  4. Dyslipidemia Continue statin as ordered and reviewed, no changes at this time   Current Outpatient Medications on File Prior to Visit  Medication Sig Dispense Refill   aspirin  EC 81 MG tablet Take 1 tablet (81 mg total) by mouth daily. Swallow whole. 90 tablet 3   buPROPion  (WELLBUTRIN  XL) 300 MG 24 hr tablet Take 1 tablet (300 mg total) by mouth every morning. 90 tablet 1   busPIRone  (BUSPAR ) 10 MG tablet Take 1 tablet (10 mg total) by mouth 2 (two) times daily. 180 tablet 1   Cholecalciferol (VITAMIN D ) 125 MCG (5000 UT) CAPS Take 5,000 Units by mouth daily.     finasteride  (PROSCAR ) 5 MG tablet Take 1 tablet (5 mg total) by mouth daily. 90 tablet 3   fluticasone  (FLONASE ) 50 MCG/ACT  nasal spray SPRAY 2 SPRAYS INTO EACH NOSTRIL EVERY DAY 48 mL 0   furosemide  (LASIX ) 20 MG tablet Take 1 tablet (20 mg total) by mouth as needed. 90 tablet 3   GLUCOSAMINE-CHONDROITIN PO Take 2 tablets by mouth in the morning and at bedtime.     ketoconazole (NIZORAL) 2 % shampoo Apply topically.     lidocaine  (XYLOCAINE ) 5 % ointment Apply 1 application. topically 3 (three) times daily as needed. Apply pea size amount to area as needed 35.44 g 0   metoprolol  tartrate  (LOPRESSOR ) 25 MG tablet Take 1 tablet (25 mg total) by mouth 2 (two) times daily. 180 tablet 3   Multiple Vitamin (MULTIVITAMIN) tablet Take 1 tablet by mouth daily.     omega-3 acid ethyl esters (LOVAZA ) 1 g capsule Take 2 capsules (2 g total) by mouth 2 (two) times daily. 360 capsule 1   pantoprazole  (PROTONIX ) 20 MG tablet Take 1 tablet (20 mg total) by mouth daily. 90 tablet 1   rosuvastatin  (CRESTOR ) 20 MG tablet Take 1 tablet (20 mg total) by mouth daily. 90 tablet 3   Saccharomyces boulardii (PROBIOTIC) 250 MG CAPS Take 1 capsule by mouth daily. 30 capsule 0   tamsulosin  (FLOMAX ) 0.4 MG CAPS capsule Take 1 capsule (0.4 mg total) by mouth daily. 90 capsule 1   tetrahydrozoline 0.05 % ophthalmic solution Place 1 drop into both eyes 2 (two) times daily.     valsartan  (DIOVAN ) 80 MG tablet Take 1 tablet (80 mg total) by mouth daily. 90 tablet 3   VISINE DRY EYE RELIEF 1 % SOLN Place 1 drop into both eyes daily as needed (dry eyes).     No current facility-administered medications on file prior to visit.    There are no Patient Instructions on file for this visit. No follow-ups on file.   Conn Trombetta E Latron Ribas, NP

## 2024-07-23 ENCOUNTER — Ambulatory Visit: Admitting: Physical Therapy

## 2024-07-23 ENCOUNTER — Encounter: Payer: Self-pay | Admitting: Family Medicine

## 2024-07-23 ENCOUNTER — Ambulatory Visit (INDEPENDENT_AMBULATORY_CARE_PROVIDER_SITE_OTHER): Admitting: Family Medicine

## 2024-07-23 ENCOUNTER — Other Ambulatory Visit: Payer: Self-pay | Admitting: Urology

## 2024-07-23 VITALS — BP 132/76 | HR 60 | Resp 16 | Ht 70.0 in | Wt 295.5 lb

## 2024-07-23 DIAGNOSIS — M170 Bilateral primary osteoarthritis of knee: Secondary | ICD-10-CM | POA: Diagnosis not present

## 2024-07-23 DIAGNOSIS — R269 Unspecified abnormalities of gait and mobility: Secondary | ICD-10-CM

## 2024-07-23 DIAGNOSIS — M25662 Stiffness of left knee, not elsewhere classified: Secondary | ICD-10-CM | POA: Diagnosis not present

## 2024-07-23 DIAGNOSIS — I700 Atherosclerosis of aorta: Secondary | ICD-10-CM

## 2024-07-23 DIAGNOSIS — N401 Enlarged prostate with lower urinary tract symptoms: Secondary | ICD-10-CM | POA: Diagnosis not present

## 2024-07-23 DIAGNOSIS — R2681 Unsteadiness on feet: Secondary | ICD-10-CM | POA: Diagnosis not present

## 2024-07-23 DIAGNOSIS — R739 Hyperglycemia, unspecified: Secondary | ICD-10-CM | POA: Diagnosis not present

## 2024-07-23 DIAGNOSIS — F339 Major depressive disorder, recurrent, unspecified: Secondary | ICD-10-CM

## 2024-07-23 DIAGNOSIS — R262 Difficulty in walking, not elsewhere classified: Secondary | ICD-10-CM

## 2024-07-23 DIAGNOSIS — I480 Paroxysmal atrial fibrillation: Secondary | ICD-10-CM

## 2024-07-23 DIAGNOSIS — G4733 Obstructive sleep apnea (adult) (pediatric): Secondary | ICD-10-CM

## 2024-07-23 DIAGNOSIS — M6281 Muscle weakness (generalized): Secondary | ICD-10-CM

## 2024-07-23 DIAGNOSIS — M25661 Stiffness of right knee, not elsewhere classified: Secondary | ICD-10-CM

## 2024-07-23 DIAGNOSIS — I10 Essential (primary) hypertension: Secondary | ICD-10-CM | POA: Diagnosis not present

## 2024-07-23 DIAGNOSIS — N138 Other obstructive and reflux uropathy: Secondary | ICD-10-CM

## 2024-07-23 DIAGNOSIS — I69351 Hemiplegia and hemiparesis following cerebral infarction affecting right dominant side: Secondary | ICD-10-CM

## 2024-07-23 DIAGNOSIS — R202 Paresthesia of skin: Secondary | ICD-10-CM | POA: Diagnosis not present

## 2024-07-23 DIAGNOSIS — I872 Venous insufficiency (chronic) (peripheral): Secondary | ICD-10-CM

## 2024-07-23 DIAGNOSIS — N200 Calculus of kidney: Secondary | ICD-10-CM

## 2024-07-23 DIAGNOSIS — R279 Unspecified lack of coordination: Secondary | ICD-10-CM | POA: Diagnosis not present

## 2024-07-23 MED ORDER — OMEGA-3-ACID ETHYL ESTERS 1 G PO CAPS: 2.0000 g | ORAL_CAPSULE | Freq: Two times a day (BID) | ORAL | 1 refills | Status: AC

## 2024-07-23 NOTE — Therapy (Unsigned)
 OUTPATIENT PHYSICAL THERAPY LOWER EXTREMITY   Patient Name: Tom Underwood MRN: [id number] DOB:11-26-1951, 73 y.o., male Today's Date: 07/23/2024  END OF SESSION:  PT End of Session - 07/23/24 1302     Visit Number 16    Number of Visits 24    Date for PT Re-Evaluation 07/30/24    PT Start Time 1302    PT Stop Time 1355    PT Time Calculation (min) 53 min    Equipment Utilized During Treatment Gait belt    Activity Tolerance Patient tolerated treatment well;Patient limited by fatigue    Behavior During Therapy WFL for tasks assessed/performed         Past Medical History:  Diagnosis Date   Anxiety    Arthritis    Atrial fibrillation (HCC)    a.) CHA2DS2-VASc = 4 (age, HTN, CVA x2). b.) s/p 200 J synchronized cardioversion (DCCV) on 12/08/2021. c.) rate/rythum maintained on oral metoprolol  tartrate; chronically anticoagulated using rivaroxaban .   BPH (benign prostatic hyperplasia)    DDD (degenerative disc disease), lumbar    Decreased libido    Depression    Diverticulitis    Diverticulosis    GERD (gastroesophageal reflux disease)    Hepatic steatosis    History of 2019 novel coronavirus disease (COVID-19) 12/28/2020   History of cardiac murmur in childhood    History of kidney stones    HLD (hyperlipidemia)    Hypertension    Hypogonadism in male    OSA on CPAP    Pre-diabetes    Rhinitis, allergic    Sleep apnea    Stroke (HCC) 07/23/2019   a.) LEFT posterior cor radiata. b.) residual RIGHT sided weakness   Past Surgical History:  Procedure Laterality Date   CARDIOVERSION N/A 12/08/2021   Procedure: CARDIOVERSION;  Surgeon: Mady Bruckner, MD;  Location: [facility] ORS;  Service: Cardiovascular;  Laterality: N/A;   COLONOSCOPY     COLONOSCOPY WITH PROPOFOL  N/A 10/06/2023   Procedure: COLONOSCOPY WITH PROPOFOL ;  Surgeon: Therisa Bi, MD;  Location: [facility];  Service: Gastroenterology;  Laterality: N/A;   EVALUATION UNDER ANESTHESIA WITH HEMORRHOIDECTOMY  N/A 04/15/2022   Procedure: EXAM UNDER ANESTHESIA WITH HEMORRHOIDECTOMY;  Surgeon: Tye Millet, DO;  Location: [facility] ORS;  Service: General;  Laterality: N/A;   EXTERNAL EAR SURGERY     EYE SURGERY     FINGER SURGERY Right    lateration to 5 th digit   KNEE SURGERY     left eye     POLYPECTOMY  10/06/2023   Procedure: POLYPECTOMY;  Surgeon: Therisa Bi, MD;  Location: [facility] ENDOSCOPY;  Service: Gastroenterology;;   RECTAL EXAM UNDER ANESTHESIA N/A 04/23/2022   Procedure: RECTAL EXAM UNDER ANESTHESIA WITH LIGATION OF BLEEDING;  Surgeon: Desiderio Schanz, MD;  Location: [facility] ORS;  Service: General;  Laterality: N/A;   Patient Active Problem List   Diagnosis Date Noted   Chronic venous insufficiency of lower extremity 05/23/2024   History of atrial fibrillation 11/22/2023   Major depression, recurrent, chronic (HCC) 11/22/2023   Adenomatous polyp of colon 10/06/2023   Gastroesophageal reflux disease without esophagitis 05/17/2022   History of syncope 05/17/2022   History of hemorrhoidectomy 05/17/2022   Vitamin D  deficiency 05/17/2022   Lower extremity edema 05/17/2022   History of rectal bleeding 05/17/2022   Morbid obesity (HCC)    Hypertension    Hemiparesis of right dominant side as late effect of cerebral infarction (HCC) 08/13/2019   History of kidney stones 05/02/2018   ED (erectile dysfunction) 05/02/2018  Hyperglycemia 01/30/2017   Arthritis of knee, degenerative 01/30/2017   BPH with obstruction/lower urinary tract symptoms 02/03/2016   Dyslipidemia 08/04/2015   OSA on CPAP 06/30/2015    PCP: Glenard Mire, MD  REFERRING PROVIDER: Sowles, Krichna, MD  REFERRING DIAG:  [phone] (ICD-10-CM) - Hemiparesis of right dominant side as late effect of cerebral infarction (HCC)  R26.81 (ICD-10-CM) - Gait instability    THERAPY DIAG:  Muscle weakness (generalized)  Abnormality of gait and mobility  Joint stiffness of both knees  Difficulty in walking, not elsewhere  classified  Unsteadiness on feet  Rationale for Evaluation and Treatment: Rehabilitation  ONSET DATE: chronic  SUBJECTIVE:   SUBJECTIVE STATEMENT: Pt. Reports <5/10 B knee pain walking around PT clinic.  Pt. States he has been walking but limited by knee pain/ endurance.  Pt. Referred back to PT by MD since discharge from PT a few months ago due to regression in LE strength/ endurance.  Pt. Had a R sided stroke on 08/23/2019 and lives with wife and uses [facility] for safety with walking.  Pt. Fearful with walking on varying terrain/ curbs.  No recent falls reported.    PERTINENT HISTORY: Pt. Known well to PT clinic.    PAIN:  Are you having pain? Yes: NPRS scale: 4-5/10  Pain location: B knees Pain description: achy/ persistent Aggravating factors: walking/ step ups Relieving factors: rest  PRECAUTIONS: Fall  RED FLAGS: None   WEIGHT BEARING RESTRICTIONS: No  FALLS:  Has patient fallen in last 6 months? No  LIVING ENVIRONMENT: Lives with: lives with their spouse Lives in: Mobile home Stairs: Yes: External: 5 steps; can reach both Has following equipment at home: Single point cane  OCCUPATION: Retired  PLOF: Independent with household mobility with device  PATIENT GOALS: Increase R sided muscle strength/ walking endurance/ balance.    NEXT MD VISIT: PRN  OBJECTIVE:  Note: Objective measures were completed at Evaluation unless otherwise noted.  PATIENT SURVEYS:  LEFS: 18 out of 80.    COGNITION: Overall cognitive status: Within functional limits for tasks assessed     SENSATION: Light touch: Impaired   EDEMA:  Moderate B lower leg swelling.  Pt. Has compression stockings/ lymphedema pumps.  Marked improvement in lower leg swelling as compared to last PT tx. Session.     MUSCLE LENGTH: Hamstrings: NT Thomas test: Unable to test due to limited supine positioning  POSTURE: rounded shoulders, forward head, and flexed trunk   PALPATION: (+) B knee joint line and  lower leg tenderness.  Pitting edema noted in B lower legs (slightly improved from last PT tx. Session several months ago).    LOWER EXTREMITY ROM:  Active ROM Right eval Left eval  Hip flexion <90 deg. <90 deg.  Hip extension    Hip abduction    Hip adduction    Hip internal rotation    Hip external rotation    Knee flexion    Knee extension    Ankle dorsiflexion    Ankle plantarflexion    Ankle inversion    Ankle eversion     (Blank rows = not tested)  LOWER EXTREMITY MMT:  MMT Right eval Left eval  Hip flexion 4- 4  Hip extension    Hip abduction 4 4+  Hip adduction    Hip internal rotation 4+ 4+  Hip external rotation 4 4  Knee flexion 5 5  Knee extension 5 5  Ankle dorsiflexion    Ankle plantarflexion    Ankle inversion  Ankle eversion     (Blank rows = not tested)  LOWER EXTREMITY SPECIAL TESTS:  NT  FUNCTIONAL TESTS:  5 times sit to stand: 23.44 sec. (1st attempt), 12.67 sec. (2nd attempt).   Berg Balance Scale: TBD  GAIT: Distance walked: in clinic/ outside on sidewalk Assistive device utilized: Single point cane Level of assistance: Modified independence Comments: slow gait pattern with decreased arm swing, heel strike, reciprocal stride length. R foot ER.  No LOB during tx. Session.  Several seated rest breaks (vital WNL)  LEFS: 14/80  Berg: 42/56 5xSTS: 16.03 sec   MMT Right eval Left eval  Hip flexion 4+ 4+  Hip extension    Hip abduction 5 5  Hip adduction    Hip internal rotation 4+ 4+  Hip external rotation 4 4+  Knee flexion 5 5  Knee extension 5 5  Ankle dorsiflexion    Ankle plantarflexion    Ankle inversion    Ankle eversion     (Blank rows = not tested)                                                     TREATMENT DATE: 07/23/2024  Subjective:  Pt. reports 7/10 NPS for the L knee, 8/10 NPS on the R knee today. Pt. Reports that his appointment with vascular medicine after last week's PT session went well and that his LE  circulation is improving but more time is required for swelling to see further improvements.   There.act.:   Nu-Step B UE/LE level 3 resistance, 12 minutes (seat 10)- discussed pain symptoms/ last PT tx. Session/ upcoming MD f/u.  Figure-8 Walking through cones in hallway with cone obstacles ~60 ft. with RW with no rest break required. (CGA)  Figure-8 Walking through cones in hallway with cone obstacles without use of AD ~120 ft. (no standing rest break required) (CGA)   Standing marches during ambulation with RW, ~80 ft. With no rest break required. (CGA)  Alternating taps on 6 inch step in // bars, 2x30 sec.    Seated ankle pumps LAQs, and 20 reps each   Ascending/descending 6-inch stairs, 2 setsx 4 reps  Discussed importance of daily walking/ activity/ HEP  Not Performed on this day:   Forward and lateral step overs over 6 inch hurdles in //-bars (4 laps each)  Seated UBE: 12 min. F/b (maintaining 2 lights)- no Nustep today secondary to pt. Concerned about increase in B knee pain.    Nautilus: standing lat. Pull downs with wand (60#)- 25x2.   TRX squats: 25x2 (modified knee flexion/ range)    Lateral walking in //-bars (4 laps down and back)    PATIENT EDUCATION:  Education details: Discussed walking program/ home ex. Person educated: Patient Education method: Medical illustrator Education comprehension: verbalized understanding and returned demonstration  HOME EXERCISE PROGRAM: Standing 3-way hip ex./ walking program.  ASSESSMENT:  CLINICAL IMPRESSION: Pt. Presents to PT with 7/10 L knee pain and 8/10 R knee pain but was able to complete 10+ minutes on the Nu-Step with level 3 resistance without significant difficulty or increase in pain. Pt. With O2 saturation recorded at 97% and HR at 61 bpm prior to walking activities with O2 saturation recorded at 94% at it's lowest and HR recorded at it's highest at 72 bpm. Pt. Able to ambulate in hallway  through cone  obstacles without the use of RW. Pt. ambulated with CGA for safety and balance and with UEs in high guard position and wide BOS but with increase in right arm swing on this day without verbal cueing required. Pt. continues to require seated rest breaks in between each exercise in order to recover from SOB/general fatigue but for shorter durations on this day. Pt. Ascended stairs with step to gait pattern, unilateral HR on right side, wide BOS, and decreased speed with CGA for safety. Pt. Demonstrated increased difficulty descending stairs with step to gait pattern and bilateral HRs, requiring CGA and increased time to remain stable during single leg stance. Pt. did not experience an increase in knee pain at end of tx. session. Pt. will continue to benefit from skilled PT intervention to address decreased cardiovascular and LE endurance, and bilateral knee pain with functional activities.   OBJECTIVE IMPAIRMENTS: Abnormal gait, cardiopulmonary status limiting activity, decreased activity tolerance, decreased balance, decreased coordination, decreased endurance, decreased mobility, difficulty walking, decreased ROM, decreased strength, hypomobility, impaired flexibility, impaired sensation, impaired UE functional use, improper body mechanics, postural dysfunction, obesity, and pain.   ACTIVITY LIMITATIONS: carrying, lifting, bending, standing, squatting, stairs, transfers, bed mobility, bathing, toileting, dressing, hygiene/grooming, and locomotion level  PARTICIPATION LIMITATIONS: meal prep, cleaning, driving, shopping, and community activity  PERSONAL FACTORS: Past/current experiences are also affecting patient's functional outcome.   REHAB POTENTIAL: Good  CLINICAL DECISION MAKING: Evolving/moderate complexity  EVALUATION COMPLEXITY: Moderate   GOALS: Goals reviewed with patient? Yes  SHORT TERM GOALS: Target date: 06/04/24 Pt. Will increase B hip flexion to >90 deg. In supine position to improve  strength/ step length with gait.   Baseline:  see above.  7/15: to be assessed next visit, pt unable to attain supine position  Goal status: Partially met  2.  Pt. Will complete 5xSTS in <10 sec. To improve mobility/ decrease fall risk.   Baseline: 5 times sit to stand: 23.44 sec. (1st attempt), 12.67 sec. (2nd attempt). 7/15: 16.03 sec  Goal status: Not met   LONG TERM GOALS: Target date: 07/30/24  Pt. Independent with HEP to increase B LE muscle strength 1/2 muscle grade to improve mobility/ decrease fall risk.   Baseline: see above. 7/15: strength maintained or improved  Goal status: Partially met  2.  Pt. Will complete Berg balance test and score >48/56 to decrease fall risk/ improve walking with least assistive device.   Baseline:  43/56,  7/15: 42/56 Goal status: Not met  3.  Pt. Will increase LEFS to >30 out of 80 to improve pain-free functional mobility.   Baseline: 18 out of 80. 7/15: 14/80  Goal status: Not met   4.  Pt. Able to ambulate 15 minutes with use of SPC and consistent step pattern on outside surfaces.   Baseline:  increase B knee pain/ limited endurance. 7/15: not able to test due to weather  Goal status: Not met  PLAN:  PT FREQUENCY: 1-2x/week  PT DURATION: 12 weeks  PLANNED INTERVENTIONS: 97750- Physical Performance Testing, 97110-Therapeutic exercises, 97530- Therapeutic activity, 97112- Neuromuscular re-education, 97535- Self Care, [id number]- Manual therapy, [phone]- Gait training, [phone]- Electrical stimulation (unattended), Patient/Family education, Balance training, Stair training, Joint mobilization, Spinal mobilization, Cryotherapy, and Moist heat  PLAN FOR NEXT SESSION:  Continue stair training and consider using SPC in left UE to best mimic environment he is required to use steps with on Friday's to climb stairs to the press box at [place].    Curtistine  Leretha, SPT Ozell JAYSON Sero, PT, DPT # [phone] Physical Therapist - [facility] 07/23/2024, 1:56 PM

## 2024-07-23 NOTE — Progress Notes (Signed)
 Name: Tom Underwood   MRN: [id number]    DOB: 1951-07-17   Date:07/23/2024       Progress Note  Subjective  Chief Complaint  Chief Complaint  Patient presents with   leg discomfort    Legs and knees feel heavy on going for years, will be doing PT   Discussed the use of AI scribe software for clinical note transcription with the patient, who gave verbal consent to proceed.  History of Present Illness Tom Underwood is a 73 year old male with a history of stroke and osteoarthritis who presents with gait abnormalities and knee pain.  He has ongoing gait abnormalities and knee pain, which have been persistent issues. He attends physical therapy regularly, which he finds beneficial for strengthening his knees. His knees feel weak and achy, and he uses a walker to aid mobility. He has a history of stroke with right-sided hemiparesis, balance issues, muscle weakness, joint stiffness, unsteadiness, and lack of coordination.  He reports burning and aching sensations in his feet. He was previously prescribed amitriptyline  25 mg, which he is not currently taking since he though it was only for sleep and has been sleeping well lately.   He has seen a vascular doctor for chronic venous insufficiency and lymphedema, and uses leg pumps and compression stockings that seems to be helping with symptoms   His osteoarthritis in both knees has led to previous treatments with hyaluronic gel  injections. He has recently lost weight, attributing this to increased physical activity and physical therapy.  He has paroxysmal atrial fibrillation and is on medication for rhythm control, including Lopressor  25 mg twice a day and aspirin . He underwent cardioversion in the past and reports no current symptoms of atrial fibrillation, he is no longer on Xarelto  due to bleeding.  He also  has a history of atherosclerosis of aorta  and is taking rosuvastatin  20 mg for cholesterol management.  He reports frequent  urination, approximately every hour to hour and a half, and has a history of benign prostatic hyperplasia. He has experienced kidney stones in the past, with a recent episode causing severe back pain. He is due for follow up with urologist soon   He is seeing a therapist, Charlott Collet, for PTSD and major depression, which he feels is improving. He is on Wellbutrin  and Buspar  as needed for his mental health conditions, he states his wife treats him like a child at times and it makes him lose his temper and yell. Discussed couples therapy  OSA on CPAP, he is compliant with therapy and is under the care of neurologist .    Patient Active Problem List   Diagnosis Date Noted   Chronic venous insufficiency of lower extremity 05/23/2024   History of atrial fibrillation 11/22/2023   Major depression, recurrent, chronic (HCC) 11/22/2023   Adenomatous polyp of colon 10/06/2023   Gastroesophageal reflux disease without esophagitis 05/17/2022   History of syncope 05/17/2022   History of hemorrhoidectomy 05/17/2022   Vitamin D  deficiency 05/17/2022   Lower extremity edema 05/17/2022   History of rectal bleeding 05/17/2022   Morbid obesity (HCC)    Hypertension    Hemiparesis of right dominant side as late effect of cerebral infarction (HCC) 08/13/2019   History of kidney stones 05/02/2018   ED (erectile dysfunction) 05/02/2018   Hyperglycemia 01/30/2017   Arthritis of knee, degenerative 01/30/2017   BPH with obstruction/lower urinary tract symptoms 02/03/2016   Dyslipidemia 08/04/2015   OSA on CPAP 06/30/2015  Past Surgical History:  Procedure Laterality Date   CARDIOVERSION N/A 12/08/2021   Procedure: CARDIOVERSION;  Surgeon: Mady Bruckner, MD;  Location: [facility] ORS;  Service: Cardiovascular;  Laterality: N/A;   COLONOSCOPY     COLONOSCOPY WITH PROPOFOL  N/A 10/06/2023   Procedure: COLONOSCOPY WITH PROPOFOL ;  Surgeon: Therisa Bi, MD;  Location: [facility] ENDOSCOPY;  Service: Gastroenterology;   Laterality: N/A;   EVALUATION UNDER ANESTHESIA WITH HEMORRHOIDECTOMY N/A 04/15/2022   Procedure: EXAM UNDER ANESTHESIA WITH HEMORRHOIDECTOMY;  Surgeon: Tye Millet, DO;  Location: [facility] ORS;  Service: General;  Laterality: N/A;   EXTERNAL EAR SURGERY     EYE SURGERY     FINGER SURGERY Right    lateration to 5 th digit   KNEE SURGERY     left eye     POLYPECTOMY  10/06/2023   Procedure: POLYPECTOMY;  Surgeon: Therisa Bi, MD;  Location: [facility] ENDOSCOPY;  Service: Gastroenterology;;   RECTAL EXAM UNDER ANESTHESIA N/A 04/23/2022   Procedure: RECTAL EXAM UNDER ANESTHESIA WITH LIGATION OF BLEEDING;  Surgeon: Desiderio Schanz, MD;  Location: [facility] ORS;  Service: General;  Laterality: N/A;    Family History  Problem Relation Age of Onset   Asthma Mother    Heart disease Mother    Heart attack Mother    Dementia Mother    COPD Mother    Asthma Father    Heart disease Father    Stroke Father     Social History   Tobacco Use   Smoking status: Former    Current packs/day: 0.00    Types: Pipe, Cigarettes    Start date: 02/20/1973    Quit date: 02/20/1974    Years since quitting: 50.4   Smokeless tobacco: Never  Substance Use Topics   Alcohol use: No     Current Outpatient Medications:    aspirin  EC 81 MG tablet, Take 1 tablet (81 mg total) by mouth daily. Swallow whole., Disp: 90 tablet, Rfl: 3   buPROPion  (WELLBUTRIN  XL) 300 MG 24 hr tablet, Take 1 tablet (300 mg total) by mouth every morning., Disp: 90 tablet, Rfl: 1   busPIRone  (BUSPAR ) 10 MG tablet, Take 1 tablet (10 mg total) by mouth 2 (two) times daily., Disp: 180 tablet, Rfl: 1   Cholecalciferol (VITAMIN D ) 125 MCG (5000 UT) CAPS, Take 5,000 Units by mouth daily., Disp: , Rfl:    finasteride  (PROSCAR ) 5 MG tablet, Take 1 tablet (5 mg total) by mouth daily., Disp: 90 tablet, Rfl: 3   fluticasone  (FLONASE ) 50 MCG/ACT nasal spray, SPRAY 2 SPRAYS INTO EACH NOSTRIL EVERY DAY, Disp: 48 mL, Rfl: 0   furosemide  (LASIX ) 20 MG tablet, Take  1 tablet (20 mg total) by mouth as needed., Disp: 90 tablet, Rfl: 3   GLUCOSAMINE-CHONDROITIN PO, Take 2 tablets by mouth in the morning and at bedtime., Disp: , Rfl:    ketoconazole (NIZORAL) 2 % shampoo, Apply topically., Disp: , Rfl:    lidocaine  (XYLOCAINE ) 5 % ointment, Apply 1 application. topically 3 (three) times daily as needed. Apply pea size amount to area as needed, Disp: 35.44 g, Rfl: 0   metoprolol  tartrate (LOPRESSOR ) 25 MG tablet, Take 1 tablet (25 mg total) by mouth 2 (two) times daily., Disp: 180 tablet, Rfl: 3   Multiple Vitamin (MULTIVITAMIN) tablet, Take 1 tablet by mouth daily., Disp: , Rfl:    omega-3 acid ethyl esters (LOVAZA ) 1 g capsule, Take 2 capsules (2 g total) by mouth 2 (two) times daily., Disp: 360 capsule, Rfl: 1  pantoprazole  (PROTONIX ) 20 MG tablet, Take 1 tablet (20 mg total) by mouth daily., Disp: 90 tablet, Rfl: 1   rosuvastatin  (CRESTOR ) 20 MG tablet, Take 1 tablet (20 mg total) by mouth daily., Disp: 90 tablet, Rfl: 3   Saccharomyces boulardii (PROBIOTIC) 250 MG CAPS, Take 1 capsule by mouth daily., Disp: 30 capsule, Rfl: 0   tamsulosin  (FLOMAX ) 0.4 MG CAPS capsule, Take 1 capsule (0.4 mg total) by mouth daily., Disp: 90 capsule, Rfl: 1   tetrahydrozoline 0.05 % ophthalmic solution, Place 1 drop into both eyes 2 (two) times daily., Disp: , Rfl:    valsartan  (DIOVAN ) 80 MG tablet, Take 1 tablet (80 mg total) by mouth daily., Disp: 90 tablet, Rfl: 3   VISINE DRY EYE RELIEF 1 % SOLN, Place 1 drop into both eyes daily as needed (dry eyes)., Disp: , Rfl:   Allergies  Allergen Reactions   Penicillins Itching    I personally reviewed active problem list, medication list, allergies, family history with the patient/caregiver today.   ROS  Ten systems reviewed and is negative except as mentioned in HPI    Objective Physical Exam  CONSTITUTIONAL: Patient appears well-developed and well-nourished. No distress. HEENT: Head atraumatic, normocephalic, neck  supple. CARDIOVASCULAR: Normal rate, regular rhythm and normal heart sounds. No murmur heard. No atrial fibrillation present. No BLE edema. PULMONARY: Effort normal and breath sounds normal. Lungs clear to auscultation. No respiratory distress. MUSCULOSKELETAL: using a walker, crepitus with extension of both knees, slow gait  PSYCHIATRIC: Patient has a normal mood and affect. Behavior is normal. Judgment and thought content normal.  Vitals:   07/23/24 1046  BP: 132/76  Pulse: 60  Resp: 16  SpO2: 94%  Weight: 295 lb 8 oz (134 kg)  Height: 5' 10 (1.778 m)    Body mass index is 42.4 kg/m.   PHQ2/9:    07/23/2024   10:36 AM 05/30/2024   12:54 PM 05/23/2024   10:05 AM 11/22/2023   10:44 AM 11/20/2023    2:22 PM  Depression screen PHQ 2/9  Decreased Interest 0 0 1 1   Down, Depressed, Hopeless 0 0 1 1   PHQ - 2 Score 0 0 2 2   Altered sleeping 0 1 0 3   Tired, decreased energy 0 0 0 2   Change in appetite 0 0 0 2   Feeling bad or failure about yourself  0 0 1 1   Trouble concentrating 0 0 1 0   Moving slowly or fidgety/restless 0 0 0 0   Suicidal thoughts 0 0 0 0   PHQ-9 Score 0 1 4 10    Difficult doing work/chores Not difficult at all Not difficult at all Somewhat difficult Very difficult      Information is confidential and restricted. Go to Review Flowsheets to unlock data.    phq 9 is negative  Fall Risk:    07/23/2024   10:35 AM 05/30/2024   12:59 PM 05/23/2024   10:02 AM 11/22/2023   10:44 AM 05/26/2023    1:36 PM  Fall Risk   Falls in the past year? 0 0 0 0 0  Number falls in past yr: 0 0 0 0 0  Injury with Fall? 0 0 0 0 0  Risk for fall due to : No Fall Risks No Fall Risks No Fall Risks No Fall Risks No Fall Risks  Follow up Falls evaluation completed Falls evaluation completed Falls prevention discussed;Education provided;Falls evaluation completed Falls prevention discussed;Education provided;Falls evaluation  completed Falls prevention discussed;Education  provided      Assessment & Plan Abnormal gait and mobility impairment with muscle weakness, bilateral knee osteoarthritis, right-sided hemiparesis due to prior stroke, peripheral neuropathy, and lymphedema of lower extremities Chronic gait and mobility issues due to knee osteoarthritis, hemiparesis, neuropathy, and lymphedema. Physical therapy aiding knee strength. Uses walker, leg pumps, and compression stockings. Neuropathy indicated by foot pain. No recent neurologist evaluation. Amitriptyline  prescribed but not taken. Weight loss likely from increased activity. - Continue physical therapy. - go back to   neurologist for neuropathy evaluation  - Continue leg pumps and compression stockings. - Advised to go back to Ortho for knee pain  - Order blood work including B12 and folate.  Morbid obesity Morbid obesity with recent weight loss from increased physical activity. Medicare does not cover weight loss medication. - Encourage continued physical activity and weight loss. - Monitor weight and consider portion control.  Paroxysmal atrial fibrillation AFib well-controlled with metoprolol  and aspirin . Previous cardioversion successful. No symptoms. Not interested in Watchman procedure. - Continue metoprolol  25 mg BID and aspirin  daily.  Hypertension Hypertension well-controlled with valsartan  and metoprolol . - Continue valsartan  80 mg daily and metoprolol  25 mg BID.  Atherosclerosis of aorta Atherosclerosis managed with rosuvastatin . Previous echocardiogram normal. Cholesterol levels previously controlled. - Continue rosuvastatin  20 mg daily. - Order cholesterol panel.  Benign prostatic hyperplasia with lower urinary tract symptoms BPH with increased urinary frequency. Scheduled urologist visit. PSA levels to be checked. - Order PSA test. - Discuss urinary frequency with urologist.  Nephrolithiasis Recently seen by urologist and has an upcoming visit scheduled   Obstructive sleep  apnea, on CPAP Sleep apnea managed with CPAP, used nightly for 4-5 hours. - Continue CPAP therapy nightly.  Major depressive disorder recurrent chronic and post-traumatic stress disorder Depression still present but seems to be mostly in the form of anger  Receiving therapy for PTSD with reported improvement. - Continue therapy every two weeks. - Discussed marital counseling  - Consider medication adjustments - Monitor for depression and PTSD symptoms.

## 2024-07-23 NOTE — Progress Notes (Deleted)
 07/25/2024 8:23 PM   [name] August 01, 1951 [id number]  Referring provider: Sowles, Krichna, MD [address] [place],  [place] [address]  Urological history: 1. High risk hematuria -former smoker -CTU (2017) - NED -cysto (2017) - NED -CTU (08/2022) - bilateral nephrolithiasis -cysto (09/2022) - BPH w/ friable lobes -urine cytology (09/2022) - negative  2. BPH with LU TS -PSA (06/2023) 0.2 -cysto (09/2022) - prominent lateral lobe enlargement w/friability - mild trabeculation -prostate volume (09/2022) - 70 cc -tamsulosin  0.4 mg daily and finasteride  5 mg daily   3. Nephrolithiasis -spontaneous passage of a left UPJ stone (2014) -CTU (09/2022) - bilateral renal stones up to 4 mm   4. ED -contributing factors of age, BPH, HTN, HLD, stroke, sleep apnea and former smoker -not sexually active  No chief complaint on file.  HPI: RAIHAN KIMMEL is a 73 y.o. man who presents today for possible stones.    Previous records reviewed.   UA ***  KUB ***   PMH: Past Medical History:  Diagnosis Date   Anxiety    Arthritis    Atrial fibrillation (HCC)    a.) CHA2DS2-VASc = 4 (age, HTN, CVA x2). b.) s/p 200 J synchronized cardioversion (DCCV) on 12/08/2021. c.) rate/rythum maintained on oral metoprolol  tartrate; chronically anticoagulated using rivaroxaban .   BPH (benign prostatic hyperplasia)    DDD (degenerative disc disease), lumbar    Decreased libido    Depression    Diverticulitis    Diverticulosis    GERD (gastroesophageal reflux disease)    Hepatic steatosis    History of 2019 novel coronavirus disease (COVID-19) 12/28/2020   History of cardiac murmur in childhood    History of kidney stones    HLD (hyperlipidemia)    Hypertension    Hypogonadism in male    OSA on CPAP    Pre-diabetes    Rhinitis, allergic    Sleep apnea    Stroke (HCC) 07/23/2019   a.) LEFT posterior cor radiata. b.) residual RIGHT sided weakness    Surgical  History: Past Surgical History:  Procedure Laterality Date   CARDIOVERSION N/A 12/08/2021   Procedure: CARDIOVERSION;  Surgeon: Mady Bruckner, MD;  Location: [facility] ORS;  Service: Cardiovascular;  Laterality: N/A;   COLONOSCOPY     COLONOSCOPY WITH PROPOFOL  N/A 10/06/2023   Procedure: COLONOSCOPY WITH PROPOFOL ;  Surgeon: Therisa Bi, MD;  Location: [facility] ENDOSCOPY;  Service: Gastroenterology;  Laterality: N/A;   EVALUATION UNDER ANESTHESIA WITH HEMORRHOIDECTOMY N/A 04/15/2022   Procedure: EXAM UNDER ANESTHESIA WITH HEMORRHOIDECTOMY;  Surgeon: Tye Millet, DO;  Location: [facility] ORS;  Service: General;  Laterality: N/A;   EXTERNAL EAR SURGERY     EYE SURGERY     FINGER SURGERY Right    lateration to 5 th digit   KNEE SURGERY     left eye     POLYPECTOMY  10/06/2023   Procedure: POLYPECTOMY;  Surgeon: Therisa Bi, MD;  Location: [facility] ENDOSCOPY;  Service: Gastroenterology;;   RECTAL EXAM UNDER ANESTHESIA N/A 04/23/2022   Procedure: RECTAL EXAM UNDER ANESTHESIA WITH LIGATION OF BLEEDING;  Surgeon: Desiderio Schanz, MD;  Location: [facility] ORS;  Service: General;  Laterality: N/A;    Home Medications:  Allergies as of 07/25/2024       Reactions   Penicillins Itching        Medication List        Accurate as of July 23, 2024  8:23 PM. If you have any questions, ask your nurse or doctor.  aspirin  EC 81 MG tablet Take 1 tablet (81 mg total) by mouth daily. Swallow whole.   buPROPion  300 MG 24 hr tablet Commonly known as: WELLBUTRIN  XL Take 1 tablet (300 mg total) by mouth every morning.   busPIRone  10 MG tablet Commonly known as: BUSPAR  Take 1 tablet (10 mg total) by mouth 2 (two) times daily.   finasteride  5 MG tablet Commonly known as: PROSCAR  Take 1 tablet (5 mg total) by mouth daily.   fluticasone  50 MCG/ACT nasal spray Commonly known as: FLONASE  SPRAY 2 SPRAYS INTO EACH NOSTRIL EVERY DAY   furosemide  20 MG tablet Commonly known as: LASIX  Take 1 tablet (20 mg  total) by mouth as needed.   GLUCOSAMINE-CHONDROITIN PO Take 2 tablets by mouth in the morning and at bedtime.   ketoconazole 2 % shampoo Commonly known as: NIZORAL Apply topically.   lidocaine  5 % ointment Commonly known as: XYLOCAINE  Apply 1 application. topically 3 (three) times daily as needed. Apply pea size amount to area as needed   metoprolol  tartrate 25 MG tablet Commonly known as: LOPRESSOR  Take 1 tablet (25 mg total) by mouth 2 (two) times daily.   multivitamin tablet Take 1 tablet by mouth daily.   omega-3 acid ethyl esters 1 g capsule Commonly known as: LOVAZA  Take 2 capsules (2 g total) by mouth 2 (two) times daily.   pantoprazole  20 MG tablet Commonly known as: PROTONIX  Take 1 tablet (20 mg total) by mouth daily.   Probiotic 250 MG Caps Take 1 capsule by mouth daily.   rosuvastatin  20 MG tablet Commonly known as: CRESTOR  Take 1 tablet (20 mg total) by mouth daily.   tamsulosin  0.4 MG Caps capsule Commonly known as: FLOMAX  Take 1 capsule (0.4 mg total) by mouth daily.   tetrahydrozoline 0.05 % ophthalmic solution Place 1 drop into both eyes 2 (two) times daily.   valsartan  80 MG tablet Commonly known as: DIOVAN  Take 1 tablet (80 mg total) by mouth daily.   Visine Dry Eye Relief 1 % Soln Generic drug: Polyethylene Glycol 400 Place 1 drop into both eyes daily as needed (dry eyes).   Vitamin D  125 MCG (5000 UT) Caps Take 5,000 Units by mouth daily.        Allergies:  Allergies  Allergen Reactions   Penicillins Itching    Family History: Family History  Problem Relation Age of Onset   Asthma Mother    Heart disease Mother    Heart attack Mother    Dementia Mother    COPD Mother    Asthma Father    Heart disease Father    Stroke Father     Social History:  reports that he quit smoking about 50 years ago. His smoking use included pipe and cigarettes. He started smoking about 51 years ago. He has never used smokeless tobacco. He  reports that he does not drink alcohol and does not use drugs.  ROS: Pertinent ROS in HPI  Physical Exam: There were no vitals taken for this visit.  Constitutional:  Well nourished. Alert and oriented, No acute distress. HEENT: [place] AT, moist mucus membranes.  Trachea midline, no masses. Cardiovascular: No clubbing, cyanosis, or edema. Respiratory: Normal respiratory effort, no increased work of breathing. GI: Abdomen is soft, non tender, non distended, no abdominal masses. Liver and spleen not palpable.  No hernias appreciated.  Stool sample for occult testing is not indicated.   GU: No CVA tenderness.  No bladder fullness or masses.  Patient with circumcised/uncircumcised  phallus. ***Foreskin easily retracted***  Urethral meatus is patent.  No penile discharge. No penile lesions or rashes. Scrotum without lesions, cysts, rashes and/or edema.  Testicles are located scrotally bilaterally. No masses are appreciated in the testicles. Left and right epididymis are normal. Rectal: Patient with  normal sphincter tone. Anus and perineum without scarring or rashes. No rectal masses are appreciated. Prostate is approximately *** grams, *** nodules are appreciated. Seminal vesicles are normal. Skin: No rashes, bruises or suspicious lesions. Lymph: No cervical or inguinal adenopathy. Neurologic: Grossly intact, no focal deficits, moving all 4 extremities. Psychiatric: Normal mood and affect.   Laboratory Data: See EPIC and HPI I have reviewed the labs.   Pertinent Imaging: See EPIC and HPI  I have independently reviewed the films.  See HPI.  Assessment & Plan:    1. Nephrolithiasis - UA *** - KUB ***  2. High risk hematuria -former smoker -work up (09/2022) - bilateral nephrolithiasis/BPH w/friable prostate -no reports of gross heme -UA negative for micro heme   3. BPH with LU TS -PSA stable  -tamsulsolin 0.4 mg daily and finateride 5 mg daily -bothersome symptoms of  urgency -explained that he is likely not giving his bladder enough time to empty and to start timed voids (going to the restroom every two hours while awake) and that he is on maximum medical therapy and it is time to start considering the possibility of a bladder outlet procedure -he is not excited about the idea of a procedure, but with his symptoms and increased residual it will likely be necessary in the future unless he is wanting to learn CIC -he is against catheters because his parents had to deal with them  -he would like an appointment with Dr. Francisca to discuss this further  -I encouraged him to engage in timed voids in the meantime  -I have also sent his urine out for culture to rule out indolent infection   4. ED - not sexually active   No follow-ups on file.  These notes generated with voice recognition software. I apologize for typographical errors.  CLOTILDA HELON RIGGERS  [facility] [address]  [address] [place], [place] [address] [phone]

## 2024-07-25 ENCOUNTER — Ambulatory Visit
Admission: RE | Admit: 2024-07-25 | Discharge: 2024-07-25 | Disposition: A | Discharge status: Home / Self Care | Source: Ambulatory Visit | Attending: Urology | Admitting: Urology

## 2024-07-25 ENCOUNTER — Ambulatory Visit: Admitting: Urology

## 2024-07-25 ENCOUNTER — Encounter: Payer: Self-pay | Admitting: Urology

## 2024-07-25 ENCOUNTER — Encounter: Admitting: Physical Therapy

## 2024-07-25 ENCOUNTER — Ambulatory Visit (INDEPENDENT_AMBULATORY_CARE_PROVIDER_SITE_OTHER): Admitting: Urology

## 2024-07-25 ENCOUNTER — Ambulatory Visit
Admission: RE | Admit: 2024-07-25 | Discharge: 2024-07-25 | Disposition: A | Discharge status: Home / Self Care | Attending: Urology | Admitting: Urology

## 2024-07-25 VITALS — BP 165/70 | HR 69 | Ht 70.0 in | Wt 295.5 lb

## 2024-07-25 DIAGNOSIS — N138 Other obstructive and reflux uropathy: Secondary | ICD-10-CM

## 2024-07-25 DIAGNOSIS — R109 Unspecified abdominal pain: Secondary | ICD-10-CM | POA: Diagnosis not present

## 2024-07-25 DIAGNOSIS — N200 Calculus of kidney: Secondary | ICD-10-CM

## 2024-07-25 DIAGNOSIS — N401 Enlarged prostate with lower urinary tract symptoms: Secondary | ICD-10-CM

## 2024-07-25 DIAGNOSIS — R319 Hematuria, unspecified: Secondary | ICD-10-CM | POA: Diagnosis not present

## 2024-07-25 DIAGNOSIS — N529 Male erectile dysfunction, unspecified: Secondary | ICD-10-CM

## 2024-07-25 DIAGNOSIS — N400 Enlarged prostate without lower urinary tract symptoms: Secondary | ICD-10-CM

## 2024-07-25 LAB — URINALYSIS, COMPLETE
Bilirubin, UA: NEGATIVE
Glucose, UA: NEGATIVE
Ketones, UA: NEGATIVE
Nitrite, UA: NEGATIVE
Protein,UA: NEGATIVE
Specific Gravity, UA: 1.01 (ref 1.005–1.030)
Urobilinogen, Ur: 0.2 mg/dL (ref 0.2–1.0)
pH, UA: 6.5 (ref 5.0–7.5)

## 2024-07-25 LAB — MICROSCOPIC EXAMINATION: WBC, UA: >30 /HPF — AB (ref 0–5)

## 2024-07-25 NOTE — Progress Notes (Signed)
 07/25/2024 2:25 PM   Tom Underwood June 04, 1951 [id number]  Referring provider: Glenard Mire, MD [address] [place],  [place] [address]  Urological history: 1. High risk hematuria -former smoker -CTU (2017) - NED -cysto (2017) - NED -CTU (08/2022) - bilateral nephrolithiasis -cysto (09/2022) - BPH w/ friable lobes -urine cytology (09/2022) - negative  2. BPH with LU TS -PSA (06/2023) 0.2 -cysto (09/2022) - prominent lateral lobe enlargement w/friability - mild trabeculation -prostate volume (09/2022) - 70 cc -tamsulosin  0.4 mg daily and finasteride  5 mg daily   3. Nephrolithiasis -spontaneous passage of a left UPJ stone (2014) -CTU (09/2022) - bilateral renal stones up to 4 mm   4. ED -contributing factors of age, BPH, HTN, HLD, stroke, sleep apnea and former smoker -not sexually active  Chief Complaint  Patient presents with   Nephrolithiasis   HPI: Tom Underwood is a 73 y.o. man who presents today for possible stones.    Previous records reviewed.   He states he had the sudden onset of left lower back pain and was seen at [facility] here in [place].  He states they did x-rays and had blood in his urine and was told he had a kidney stone.  He states his pain abated after he was prescribed antibiotics.  I do not have those records available for me today at this appointment.    Today, he states he has no discomfort.  Patient denies any modifying or aggravating factors.  Patient denies any recent UTI's, gross hematuria, dysuria or suprapubic/flank pain.  Patient denies any fevers, chills, nausea or vomiting.    UA positive for leukocytes  KUB bilateral 2 to 3 mm stones, radiologist interpretation still pending   PMH: Past Medical History:  Diagnosis Date   Anxiety    Arthritis    Atrial fibrillation (HCC)    a.) CHA2DS2-VASc = 4 (age, HTN, CVA x2). b.) s/p 200 J synchronized cardioversion (DCCV) on 12/08/2021. c.) rate/rythum  maintained on oral metoprolol  tartrate; chronically anticoagulated using rivaroxaban .   BPH (benign prostatic hyperplasia)    DDD (degenerative disc disease), lumbar    Decreased libido    Depression    Diverticulitis    Diverticulosis    GERD (gastroesophageal reflux disease)    Hepatic steatosis    History of 2019 novel coronavirus disease (COVID-19) 12/28/2020   History of cardiac murmur in childhood    History of kidney stones    HLD (hyperlipidemia)    Hypertension    Hypogonadism in male    OSA on CPAP    Pre-diabetes    Rhinitis, allergic    Sleep apnea    Stroke (HCC) 07/23/2019   a.) LEFT posterior cor radiata. b.) residual RIGHT sided weakness    Surgical History: Past Surgical History:  Procedure Laterality Date   CARDIOVERSION N/A 12/08/2021   Procedure: CARDIOVERSION;  Surgeon: Mady Bruckner, MD;  Location: [facility] ORS;  Service: Cardiovascular;  Laterality: N/A;   COLONOSCOPY     COLONOSCOPY WITH PROPOFOL  N/A 10/06/2023   Procedure: COLONOSCOPY WITH PROPOFOL ;  Surgeon: Therisa Bi, MD;  Location: [facility] ENDOSCOPY;  Service: Gastroenterology;  Laterality: N/A;   EVALUATION UNDER ANESTHESIA WITH HEMORRHOIDECTOMY N/A 04/15/2022   Procedure: EXAM UNDER ANESTHESIA WITH HEMORRHOIDECTOMY;  Surgeon: Tye Millet, DO;  Location: [facility] ORS;  Service: General;  Laterality: N/A;   EXTERNAL EAR SURGERY     EYE SURGERY     FINGER SURGERY Right    lateration to 5 th digit  KNEE SURGERY     left eye     POLYPECTOMY  10/06/2023   Procedure: POLYPECTOMY;  Surgeon: Therisa Bi, MD;  Location: [facility] ENDOSCOPY;  Service: Gastroenterology;;   RECTAL EXAM UNDER ANESTHESIA N/A 04/23/2022   Procedure: RECTAL EXAM UNDER ANESTHESIA WITH LIGATION OF BLEEDING;  Surgeon: Desiderio Schanz, MD;  Location: [facility] ORS;  Service: General;  Laterality: N/A;    Home Medications:  Allergies as of 07/25/2024       Reactions   Penicillins Itching        Medication List        Accurate as of July 25, 2024  2:25 PM. If you have any questions, ask your nurse or doctor.          aspirin  EC 81 MG tablet Take 1 tablet (81 mg total) by mouth daily. Swallow whole.   buPROPion  300 MG 24 hr tablet Commonly known as: WELLBUTRIN  XL Take 1 tablet (300 mg total) by mouth every morning.   busPIRone  10 MG tablet Commonly known as: BUSPAR  Take 1 tablet (10 mg total) by mouth 2 (two) times daily.   finasteride  5 MG tablet Commonly known as: PROSCAR  Take 1 tablet (5 mg total) by mouth daily.   fluticasone  50 MCG/ACT nasal spray Commonly known as: FLONASE  SPRAY 2 SPRAYS INTO EACH NOSTRIL EVERY DAY   furosemide  20 MG tablet Commonly known as: LASIX  Take 1 tablet (20 mg total) by mouth as needed.   GLUCOSAMINE-CHONDROITIN PO Take 2 tablets by mouth in the morning and at bedtime.   ketoconazole 2 % shampoo Commonly known as: NIZORAL Apply topically.   lidocaine  5 % ointment Commonly known as: XYLOCAINE  Apply 1 application. topically 3 (three) times daily as needed. Apply pea size amount to area as needed   metoprolol  tartrate 25 MG tablet Commonly known as: LOPRESSOR  Take 1 tablet (25 mg total) by mouth 2 (two) times daily.   multivitamin tablet Take 1 tablet by mouth daily.   omega-3 acid ethyl esters 1 g capsule Commonly known as: LOVAZA  Take 2 capsules (2 g total) by mouth 2 (two) times daily.   pantoprazole  20 MG tablet Commonly known as: PROTONIX  Take 1 tablet (20 mg total) by mouth daily.   Probiotic 250 MG Caps Take 1 capsule by mouth daily.   rosuvastatin  20 MG tablet Commonly known as: CRESTOR  Take 1 tablet (20 mg total) by mouth daily.   tamsulosin  0.4 MG Caps capsule Commonly known as: FLOMAX  Take 1 capsule (0.4 mg total) by mouth daily.   tetrahydrozoline 0.05 % ophthalmic solution Place 1 drop into both eyes 2 (two) times daily.   valsartan  80 MG tablet Commonly known as: DIOVAN  Take 1 tablet (80 mg total) by mouth daily.   Visine Dry Eye Relief  1 % Soln Generic drug: Polyethylene Glycol 400 Place 1 drop into both eyes daily as needed (dry eyes).   Vitamin D  125 MCG (5000 UT) Caps Take 5,000 Units by mouth daily.        Allergies:  Allergies  Allergen Reactions   Penicillins Itching    Family History: Family History  Problem Relation Age of Onset   Asthma Mother    Heart disease Mother    Heart attack Mother    Dementia Mother    COPD Mother    Asthma Father    Heart disease Father    Stroke Father     Social History:  reports that he quit smoking about 50 years ago. His smoking  use included pipe and cigarettes. He started smoking about 51 years ago. He has never used smokeless tobacco. He reports that he does not drink alcohol and does not use drugs.  ROS: Pertinent ROS in HPI  Physical Exam: BP (!) 165/70   Pulse 69   Ht 5' 10 (1.778 m)   Wt 295 lb 8 oz (134 kg)   BMI 42.40 kg/m   Constitutional:  Well nourished. Alert and oriented, No acute distress. HEENT:  AT, moist mucus membranes.  Trachea midline Cardiovascular: No clubbing, cyanosis, or edema. Respiratory: Normal respiratory effort, no increased work of breathing. Neurologic: Grossly intact, no focal deficits, moving all 4 extremities. Psychiatric: Normal mood and affect.   Laboratory Data: See EPIC and HPI I have reviewed the labs.   Pertinent Imaging: See EPIC and HPI  I have independently reviewed the films.  See HPI.  Assessment & Plan:    1. Nephrolithiasis - UA no micro heme - KUB punctate bilateral nephrolithiasis - Will request records from [facility] and plan on follow-up pending those results  2. High risk hematuria -former smoker -work up (09/2022) - bilateral nephrolithiasis/BPH w/friable prostate -no reports of gross heme -UA negative for micro heme   3. BPH with LU TS -PSA stable  -continue tamsulsolin 0.4 mg daily and finateride 5 mg daily  4. ED - not sexually active   Return for [facility]ending [facility]  records.  These notes generated with voice recognition software. I apologize for typographical errors.  CLOTILDA HELON RIGGERS  [facility] [address]  [address] [place], [place] [address] [phone]

## 2024-07-30 ENCOUNTER — Ambulatory Visit: Admitting: Physical Therapy

## 2024-07-30 DIAGNOSIS — M6281 Muscle weakness (generalized): Secondary | ICD-10-CM | POA: Diagnosis not present

## 2024-07-30 DIAGNOSIS — R269 Unspecified abnormalities of gait and mobility: Secondary | ICD-10-CM | POA: Diagnosis not present

## 2024-07-30 DIAGNOSIS — R279 Unspecified lack of coordination: Secondary | ICD-10-CM | POA: Diagnosis not present

## 2024-07-30 DIAGNOSIS — R262 Difficulty in walking, not elsewhere classified: Secondary | ICD-10-CM

## 2024-07-30 DIAGNOSIS — R2681 Unsteadiness on feet: Secondary | ICD-10-CM | POA: Diagnosis not present

## 2024-07-30 DIAGNOSIS — M25661 Stiffness of right knee, not elsewhere classified: Secondary | ICD-10-CM | POA: Diagnosis not present

## 2024-07-30 DIAGNOSIS — M25662 Stiffness of left knee, not elsewhere classified: Secondary | ICD-10-CM | POA: Diagnosis not present

## 2024-07-30 NOTE — Therapy (Signed)
 OUTPATIENT PHYSICAL THERAPY LOWER EXTREMITY   Patient Name: Tom Underwood MRN: [id number] DOB:11-25-1951, 73 y.o., male Today's Date: 07/30/2024  END OF SESSION:  PT End of Session - 07/30/24 1112     Visit Number 17    Number of Visits 24    Date for PT Re-Evaluation 07/30/24    PT Start Time 1113    PT Stop Time 1210    PT Time Calculation (min) 57 min    Equipment Utilized During Treatment Gait belt    Activity Tolerance Patient tolerated treatment well;Patient limited by fatigue;Patient limited by pain    Behavior During Therapy WFL for tasks assessed/performed         Past Medical History:  Diagnosis Date   Anxiety    Arthritis    Atrial fibrillation (HCC)    a.) CHA2DS2-VASc = 4 (age, HTN, CVA x2). b.) s/p 200 J synchronized cardioversion (DCCV) on 12/08/2021. c.) rate/rythum maintained on oral metoprolol  tartrate; chronically anticoagulated using rivaroxaban .   BPH (benign prostatic hyperplasia)    DDD (degenerative disc disease), lumbar    Decreased libido    Depression    Diverticulitis    Diverticulosis    GERD (gastroesophageal reflux disease)    Hepatic steatosis    History of 2019 novel coronavirus disease (COVID-19) 12/28/2020   History of cardiac murmur in childhood    History of kidney stones    HLD (hyperlipidemia)    Hypertension    Hypogonadism in male    OSA on CPAP    Pre-diabetes    Rhinitis, allergic    Sleep apnea    Stroke (HCC) 07/23/2019   a.) LEFT posterior cor radiata. b.) residual RIGHT sided weakness   Past Surgical History:  Procedure Laterality Date   CARDIOVERSION N/A 12/08/2021   Procedure: CARDIOVERSION;  Surgeon: Mady Bruckner, MD;  Location: [facility] ORS;  Service: Cardiovascular;  Laterality: N/A;   COLONOSCOPY     COLONOSCOPY WITH PROPOFOL  N/A 10/06/2023   Procedure: COLONOSCOPY WITH PROPOFOL ;  Surgeon: Therisa Bi, MD;  Location: [facility] ENDOSCOPY;  Service: Gastroenterology;  Laterality: N/A;   EVALUATION UNDER  ANESTHESIA WITH HEMORRHOIDECTOMY N/A 04/15/2022   Procedure: EXAM UNDER ANESTHESIA WITH HEMORRHOIDECTOMY;  Surgeon: Tye Millet, DO;  Location: [facility] ORS;  Service: General;  Laterality: N/A;   EXTERNAL EAR SURGERY     EYE SURGERY     FINGER SURGERY Right    lateration to 5 th digit   KNEE SURGERY     left eye     POLYPECTOMY  10/06/2023   Procedure: POLYPECTOMY;  Surgeon: Therisa Bi, MD;  Location: [facility] ENDOSCOPY;  Service: Gastroenterology;;   RECTAL EXAM UNDER ANESTHESIA N/A 04/23/2022   Procedure: RECTAL EXAM UNDER ANESTHESIA WITH LIGATION OF BLEEDING;  Surgeon: Desiderio Schanz, MD;  Location: [facility] ORS;  Service: General;  Laterality: N/A;   Patient Active Problem List   Diagnosis Date Noted   Chronic venous insufficiency of lower extremity 05/23/2024   History of atrial fibrillation 11/22/2023   Major depression, recurrent, chronic (HCC) 11/22/2023   Adenomatous polyp of colon 10/06/2023   Gastroesophageal reflux disease without esophagitis 05/17/2022   History of syncope 05/17/2022   History of hemorrhoidectomy 05/17/2022   Vitamin D  deficiency 05/17/2022   Lower extremity edema 05/17/2022   History of rectal bleeding 05/17/2022   Morbid obesity (HCC)    Hypertension    Hemiparesis of right dominant side as late effect of cerebral infarction (HCC) 08/13/2019   History of kidney stones 05/02/2018   ED (  erectile dysfunction) 05/02/2018   Hyperglycemia 01/30/2017   Arthritis of knee, degenerative 01/30/2017   BPH with obstruction/lower urinary tract symptoms 02/03/2016   Dyslipidemia 08/04/2015   OSA on CPAP 06/30/2015    PCP: Glenard Mire, MD  REFERRING PROVIDER: Sowles, Krichna, MD  REFERRING DIAG:  [phone] (ICD-10-CM) - Hemiparesis of right dominant side as late effect of cerebral infarction (HCC)  R26.81 (ICD-10-CM) - Gait instability    THERAPY DIAG:  Muscle weakness (generalized)  Joint stiffness of both knees  Abnormality of gait and mobility  Difficulty  in walking, not elsewhere classified  Rationale for Evaluation and Treatment: Rehabilitation  ONSET DATE: chronic  SUBJECTIVE:   SUBJECTIVE STATEMENT: Pt. Reports <5/10 B knee pain walking around PT clinic.  Pt. States he has been walking but limited by knee pain/ endurance.  Pt. Referred back to PT by MD since discharge from PT a few months ago due to regression in LE strength/ endurance.  Pt. Had a R sided stroke on 08/23/2019 and lives with wife and uses [facility] for safety with walking.  Pt. Fearful with walking on varying terrain/ curbs.  No recent falls reported.    PERTINENT HISTORY: Pt. Known well to PT clinic.    PAIN:  Are you having pain? Yes: NPRS scale: 4-5/10  Pain location: B knees Pain description: achy/ persistent Aggravating factors: walking/ step ups Relieving factors: rest  PRECAUTIONS: Fall  RED FLAGS: None   WEIGHT BEARING RESTRICTIONS: No  FALLS:  Has patient fallen in last 6 months? No  LIVING ENVIRONMENT: Lives with: lives with their spouse Lives in: Mobile home Stairs: Yes: External: 5 steps; can reach both Has following equipment at home: Single point cane  OCCUPATION: Retired  PLOF: Independent with household mobility with device  PATIENT GOALS: Increase R sided muscle strength/ walking endurance/ balance.    NEXT MD VISIT: PRN  OBJECTIVE:  Note: Objective measures were completed at Evaluation unless otherwise noted.  PATIENT SURVEYS:  LEFS: 18 out of 80.    COGNITION: Overall cognitive status: Within functional limits for tasks assessed     SENSATION: Light touch: Impaired   EDEMA:  Moderate B lower leg swelling.  Pt. Has compression stockings/ lymphedema pumps.  Marked improvement in lower leg swelling as compared to last PT tx. Session.     MUSCLE LENGTH: Hamstrings: NT Thomas test: Unable to test due to limited supine positioning  POSTURE: rounded shoulders, forward head, and flexed trunk   PALPATION: (+) B knee joint line  and lower leg tenderness.  Pitting edema noted in B lower legs (slightly improved from last PT tx. Session several months ago).    LOWER EXTREMITY ROM:  Active ROM Right eval Left eval  Hip flexion <90 deg. <90 deg.  Hip extension    Hip abduction    Hip adduction    Hip internal rotation    Hip external rotation    Knee flexion    Knee extension    Ankle dorsiflexion    Ankle plantarflexion    Ankle inversion    Ankle eversion     (Blank rows = not tested)  LOWER EXTREMITY MMT:  MMT Right eval Left eval  Hip flexion 4- 4  Hip extension    Hip abduction 4 4+  Hip adduction    Hip internal rotation 4+ 4+  Hip external rotation 4 4  Knee flexion 5 5  Knee extension 5 5  Ankle dorsiflexion    Ankle plantarflexion    Ankle inversion  Ankle eversion     (Blank rows = not tested)  LOWER EXTREMITY SPECIAL TESTS:  NT  FUNCTIONAL TESTS:  5 times sit to stand: 23.44 sec. (1st attempt), 12.67 sec. (2nd attempt).   Berg Balance Scale: TBD  GAIT: Distance walked: in clinic/ outside on sidewalk Assistive device utilized: Single point cane Level of assistance: Modified independence Comments: slow gait pattern with decreased arm swing, heel strike, reciprocal stride length. R foot ER.  No LOB during tx. Session.  Several seated rest breaks (vital WNL)  LEFS: 14/80  Berg: 42/56 5xSTS: 16.03 sec   MMT Right eval Left eval  Hip flexion 4+ 4+  Hip extension    Hip abduction 5 5  Hip adduction    Hip internal rotation 4+ 4+  Hip external rotation 4 4+  Knee flexion 5 5  Knee extension 5 5  Ankle dorsiflexion    Ankle plantarflexion    Ankle inversion    Ankle eversion     (Blank rows = not tested)                                                     TREATMENT DATE: 07/30/2024  Subjective:  Pt. reports 6/10 NPS for the L knee, 7/10 NPS on the R knee today. Pt. Reports that he has noticed he has been getting winded and out of breathe quicker than usual over the  past two weeks or so. Pt. Also states that descending stairs this past Friday was very challenging for him and required an extended period of time and considerable effort to safely do so with his cane.   There.act.:   Seated UBE: 8 min. F/b (maintaining 2 lights)- no Nustep today secondary to pt. concerned about increase in B knee pain and right hip pain.   Standing marches during ambulation in // bars, ~90 ft. (CGA)  Alternating taps on 6 inch step in // bars, 2x30 sec.    Seated ankle pumps LAQs, and 20 reps each   Ascending/descending 6-inch stairs, 16 reps total (with SPC in left UE and use of right HR)  Hallway Walking with RW, CGA, 75 ft. (Verbally cued for maintaining upright posture and was able to correct)  Hallway Walking without RW, CGA, 140 ft. (Required standing rest break  halfway though to recover from SOB)  Discussed importance of daily walking/ activity/ HEP   Not Performed on this day:  Forward and lateral step overs over 6 inch hurdles in //-bars (4 laps each) Nu-Step B UE/LE level 3 resistance, 12 minutes (seat 10)- discussed pain symptoms/ last PT tx. Session/ upcoming MD f/u.  Nautilus: standing lat. Pull downs with wand (60#)- 25x2.  TRX squats: 25x2 (modified knee flexion/ range)   Figure-8 Walking through cones in hallway with cone obstacles ~60 ft. with RW with no rest break required. (CGA) Figure-8 Walking through cones in hallway with cone obstacles without use of AD ~120 ft. (no standing rest break required) (CGA)  Lateral walking in //-bars (4 laps down and back)    PATIENT EDUCATION:  Education details: Discussed walking program/ home ex. Person educated: Patient Education method: Medical illustrator Education comprehension: verbalized understanding and returned demonstration  HOME EXERCISE PROGRAM: Standing 3-way hip ex./ walking program.  ASSESSMENT:  CLINICAL IMPRESSION: Pt. Presents to PT with 6/10 L knee pain and 7/10  R knee pain.  Today's session consisted of stair training in order to improve pt. Ability to descend steps which he reported as being difficult to do this past weekend. Pt. Ascended step with SPC in left UE and use of right Hrs and descended using left Hrs and SPC in right UE to mimic community requirements. Pt. Traversed steps with step to gait pattern and required increased time in order to safely maintain balance. Pt. Was noted to fatigue after 12 steps but was able to complete a total of 16 steps. Pt. Oxygen saturation recorded at 97% at rest and HR at rest at 60 bpm. Lowest oxygen saturation recorded at 94% after stair activities and max HR was recorded at 66 bpm. Pt. Completed marches while ambulating inside // bars and step ups to 6 inch stair height with occasional use of right UE to prevent LOB laterally. Pt. Reported an onset of 6/10 right hip pain and cardiovascular fatigue after hallway ambulation without RW on this day and required an extended duration of seated rest break in order to recover from SOB and ease of hip symptoms. Pt. will continue to benefit from skilled PT intervention to address decreased cardiovascular and LE endurance, and bilateral knee pain with functional activities.   OBJECTIVE IMPAIRMENTS: Abnormal gait, cardiopulmonary status limiting activity, decreased activity tolerance, decreased balance, decreased coordination, decreased endurance, decreased mobility, difficulty walking, decreased ROM, decreased strength, hypomobility, impaired flexibility, impaired sensation, impaired UE functional use, improper body mechanics, postural dysfunction, obesity, and pain.   ACTIVITY LIMITATIONS: carrying, lifting, bending, standing, squatting, stairs, transfers, bed mobility, bathing, toileting, dressing, hygiene/grooming, and locomotion level  PARTICIPATION LIMITATIONS: meal prep, cleaning, driving, shopping, and community activity  PERSONAL FACTORS: Past/current experiences are also affecting  patient's functional outcome.   REHAB POTENTIAL: Good  CLINICAL DECISION MAKING: Evolving/moderate complexity  EVALUATION COMPLEXITY: Moderate   GOALS: Goals reviewed with patient? Yes  SHORT TERM GOALS: Target date: 06/04/24 Pt. Will increase B hip flexion to >90 deg. In supine position to improve strength/ step length with gait.   Baseline:  see above.  7/15: to be assessed next visit, pt unable to attain supine position  Goal status: Partially met  2.  Pt. Will complete 5xSTS in <10 sec. To improve mobility/ decrease fall risk.   Baseline: 5 times sit to stand: 23.44 sec. (1st attempt), 12.67 sec. (2nd attempt). 7/15: 16.03 sec  Goal status: Not met   LONG TERM GOALS: Target date: 07/30/24  Pt. Independent with HEP to increase B LE muscle strength 1/2 muscle grade to improve mobility/ decrease fall risk.   Baseline: see above. 7/15: strength maintained or improved  Goal status: Partially met  2.  Pt. Will complete Berg balance test and score >48/56 to decrease fall risk/ improve walking with least assistive device.   Baseline:  43/56,  7/15: 42/56 Goal status: Not met  3.  Pt. Will increase LEFS to >30 out of 80 to improve pain-free functional mobility.   Baseline: 18 out of 80. 7/15: 14/80  Goal status: Not met   4.  Pt. Able to ambulate 15 minutes with use of SPC and consistent step pattern on outside surfaces.   Baseline:  increase B knee pain/ limited endurance. 7/15: not able to test due to weather  Goal status: Not met  PLAN:  PT FREQUENCY: 1-2x/week  PT DURATION: 12 weeks  PLANNED INTERVENTIONS: 97750- Physical Performance Testing, 97110-Therapeutic exercises, 97530- Therapeutic activity, [id number]- Neuromuscular re-education, 97535- Self Care, [id number]- Manual therapy, [id number]- Gait  training, [phone]- Electrical stimulation (unattended), Patient/Family education, Balance training, Stair training, Joint mobilization, Spinal mobilization, Cryotherapy, and Moist heat  PLAN  FOR NEXT SESSION:  Continue stair training if pt. can tolerate without increased knee pain and reassess goals (BERG and LE MMTs).  CHECK GOALS/RECERT.     Curtistine Bracket, SPT Ozell JAYSON Sero, PT, DPT # [phone] Physical Therapist - [facility] 07/30/2024, 5:12 PM

## 2024-07-31 ENCOUNTER — Ambulatory Visit (INDEPENDENT_AMBULATORY_CARE_PROVIDER_SITE_OTHER): Payer: PRIVATE HEALTH INSURANCE | Admitting: Professional

## 2024-07-31 ENCOUNTER — Encounter: Payer: Self-pay | Admitting: Professional

## 2024-07-31 ENCOUNTER — Ambulatory Visit: Payer: PRIVATE HEALTH INSURANCE | Admitting: Professional

## 2024-07-31 DIAGNOSIS — F411 Generalized anxiety disorder: Secondary | ICD-10-CM | POA: Diagnosis not present

## 2024-07-31 DIAGNOSIS — F320 Major depressive disorder, single episode, mild: Secondary | ICD-10-CM | POA: Diagnosis not present

## 2024-07-31 NOTE — Progress Notes (Signed)
 [facility] Counselor/Therapist Progress Note  Patient ID: Tom Underwood, MRN: [id number],    Date: 07/31/2024  Time Spent: 42 minutes 1005-1047am   Treatment Type: Individual Therapy  Risk Assessment: Danger to Self:  No Self-injurious Behavior: No Danger to Others: No Duty to Warn:no  Subjective: This session was held via video teletherapy. The patient consented to video teletherapy and was located at his home during this session. He is aware it is the responsibility of the patient to secure confidentiality on her end of the session. The provider was in a private home office for the duration of this session.    The patient arrived on time for his Caregility appointment.   Issues addressed: 1-homework a-drive on interstate from [place] to [place] every other day until next session -he did it only twice and felt good about doing it twice but also feels no he didn't -increase trips each week b-put post it note I can do it on the dashboard- complete -begin using interstate to get to appointments c-going to the beach on Sept 19th and he will be driving a long ways 2-mood -pleasant and bright 3-PT -knees are giving him some trouble -will  be going back to [facility] to get the gel shorts again -he does not want the knee replacement -he would rather have some use of his knees than none 4-driving -continues to obsess about his fear of driving -obsesses over planning for any driving including no left turns, what time he goes, what route he will take -unable to move away from his obsessions/fears related to driving 5-treatment -pt appears that he could benefit from ERP -discussed with pt and he is willing to use ERP and a new therapy if that helps -pt to contact his insurance agent to try and find a resource before next session  Treatment Plan Problems: Anxiety, Unipolar Depression, Posttraumatic Stress Disorder (PTSD), Grief / Loss Unresolved,   Symptoms: Excessive and/or unrealistic worry that is difficult to control occurring more days than not for at least 6 months about a number of events or activities. Motor tension (e.g., restlessness, tiredness, shakiness, muscle tension). Hypervigilance (e.g., feeling constantly on edge, experiencing concentration difficulties, having trouble falling or staying asleep, exhibiting a general state of irritability). Thoughts dominated by loss coupled with poor concentration, tearful spells, and confusion about the future. Serial losses in life (i.e., deaths, divorces, jobs) that led to depression and discouragement. Strong emotional response of sadness exhibited when losses are discussed. Lack of appetite, weight loss, and/or insomnia as well as other depression signs that occurred since the loss. Has been exposed to a traumatic event involving actual or perceived threat of death or serious injury. Reports response of intense fear, helplessness, or horror to the traumatic event. Experiences disturbing and persistent thoughts, images, and/or perceptions of the traumatic event. Displays significant psychological and/or physiological distress resulting from internal and external clues that are reminiscent of the traumatic event. Intentionally avoids thoughts, feelings, or discussions related to the traumatic event. Intentionally avoids activities, places, people, or objects (e.g., up-armored vehicles) that evoke memories of the event. Displays a significant decline in interest and engagement in activities. Experiences disturbances in sleep. Reports difficulty concentrating as well as feelings of guilt. Reports hypervigilance. Symptoms present more than one month. Impairment in social, occupational, or other areas of functioning. Depressed or irritable mood. Decrease or loss of appetite. Diminished interest in or enjoyment of activities. Sleeplessness or hypersomnia. Lack of energy. Poor  concentration and indecisiveness. Social  withdrawal. Feelings of hopelessness, worthlessness, or inappropriate guilt. Unresolved grief issues. History of chronic or recurrent depression for which the client has taken antidepressant medication, been hospitalized, had outpatient treatment, or had a course of electroconvulsive therapy. Goals: Learn and implement coping skills that result in a reduction of anxiety and worry, and improved daily functioning. Enhance ability to effectively cope with the full variety of life's worries and anxieties. Stabilize anxiety level while increasing ability to function on a daily basis. Begin a healthy grieving process around the loss. Develop an awareness of how the avoidance of grieving has affected life and begin the healing process. Resolve the loss, reengaging in old relationships and initiating new contacts with others. Eliminate or reduce the negative impact trauma related symptoms have on social, occupational, and family functioning. Thinks about or openly discusses the traumatic event with others without experiencing psychological or physiological distress. No longer avoids persons, places, activities, and objects that are reminiscent of the traumatic event. Alleviate depressive symptoms and return to previous level of effective functioning. Recognize, accept, and cope with feelings of depression. Develop healthy thinking patterns and beliefs about self, others, and the world that lead to the alleviation and help prevent the relapse of depression. Appropriately grieve the loss in order to normalize mood and to return to previously adaptive level of functioning. Objectives target date for all objectives is 11/19/2024 Describe situations, thoughts, feelings, and actions associated with anxieties and worries, their impact on functioning, and attempts to resolve them.   50% Verbalize an understanding of the cognitive, physiological, and behavioral components  of anxiety and its treatment.   40% Learn and implement calming skills to reduce overall anxiety and manage anxiety symptoms.  30% Learn and implement a strategy to limit the association between various environmental settings and worry, delaying the worry until a designated worry time.   20% Learn and implement problem-solving strategies for realistically addressing worries.   40% Identify, challenge, and replace biased, fearful self-talk with positive, realistic, and empowering self-talk.   65% Verbalize an understanding of the role that cognitive biases play in excessive irrational worry and persistent anxiety symptoms.   65% Identify and engage in pleasant activities on a daily basis.  65% Learn and implement relapse prevention strategies for managing possible future anxiety symptoms.   40% Learn to accept limitations in life and commit to tolerating, rather than avoiding, unpleasant emotions while accomplishing meaningful goals.  40% Tell in detail the story of the current loss that is triggering symptoms.   100% Identify what stages of grief have been experienced in the continuum of the grieving process.   40% (has not grieved mother's death) Begin verbalizing feelings associated with the loss.   20% Verbalize resolution of feelings of guilt and regret associated with the loss. 30% Identify and voice positives about the deceased loved one including previous positive experiences, positive characteristics, positive aspects of the relationship, and how these things may be remembered.   10% Learn and implement calming skills.   50% Learn and implement guided self-dialogue to manage thoughts, feelings, and urges brought on by encounters with trauma-related situations.   30% Acknowledge the need to implement anger control techniques; learn and implement anger management techniques.   30% Implement a regular exercise regimen as a stress release technique.   50% Describe current and past experiences  with depression including their impact on functioning and attempts to resolve it.   70% Verbalize an accurate understanding of depression.   70% Identify and replace  thoughts and beliefs that support depression.   40% Learn and implement behavioral strategies to overcome depression.   60% Identify important people in life, past and present, and describe the quality, good and poor, of those relationships.   30% Learn and implement problem-solving and decision-making skills.   30% Learn and implement conflict resolution skills to resolve interpersonal problems.   30% Interventions: Use empathy, compassion, and support, allowing the client to tell in detail the story of his/her recent loss. Assign the client to make a list of all the regrets associated with actions toward or relationship with the deceased; process the list content toward resolution of these feelings. Assist the client in engaging in behaviors that celebrate the positive memorable aspects of the loved one and his/her life (e.g., placing memoriam in newspaper on anniversary of death, volunteering time to a favorite cause of the deceased person). Ask the client to discuss and/or list the positive aspects of and memories about his/her relationship with the lost loved one; reinforce the client's expression of positive memories and emotions (e.g., smiling, laughing); encourage the client to share these thoughts with supportive loved ones. Educate the client on the stages of the grieving process and answer any questions he/she may have. Assist the client in identifying the stages of grief that he/she has experienced and which stage he/she is presently working through. Ask the client to bring pictures or mementos connected with his/her loss to a session and talk about them (or assign Creating a Patent examiner in the Adult Psychotherapy Administrator, arts by Jenniffer). Assist the client in identifying and expressing feelings connected with  his/her loss. Gently and sensitively explore the client's recollection of the facts of the traumatic incident and his/her cognitive and emotional reactions at the time; assess frequency, intensity, duration, and history of the client's PTSD symptoms and their impact on functioning (see How the Trauma Affects Me in the Adult Psychotherapy Homework Planner by Jongsma); supplement with semi-structured assessment instrument if desired (see The Anxiety Disorders Interview Schedule - Adult Version). Using Cognitive Therapy techniques, explore the client's self-talk and beliefs about self, others, and the future that are a consequence of the trauma (e.g., themes of safety, trust, power, control, esteem, and intimacy); identify and challenge biases; assist him/her in generating appraisals that correct for the biases; test biased and alternatives predictions through behavioral experiments. Teach the client a guided self-dialogue procedure in which he/she learns to recognize maladaptive self-talk, challenges its biases, copes with engendered feelings, overcomes avoidance, and reinforces his/her accomplishments; review and reinforce progress, problem-solve obstacles. Utilize Eye Movement Desensitization and Reprocessing (EMDR) to reduce the client's emotional reactivity to the traumatic event and reduce PTSD symptoms. Assess the client for instances of poor anger management that have led to threats or actual violence that caused damage to property and/or injury to people (or assign Anger Journal in the Adult Psychotherapy Homework Planner by Jenniffer). Teach the client anger management techniques (see the Anger Control Problems chapter in this Planner). Develop and encourage a routine of physical exercise for the client. Teach the client calming skills (e.g., breathing retraining, relaxation, calming self-talk) to use in and between sessions when feeling overly distressed. Ask the client to describe his/her past  experiences of anxiety and their impact on functioning; assess the focus, excessiveness, and uncontrollability of the worry and the type, frequency, intensity, and duration of his/her anxiety symptoms (consider using a structured interview such as The Anxiety Disorders Interview Schedule-Adult Version). Explore the client's schema and self-talk that mediate  his/her fear response; assist him/her in challenging the biases; replace the distorted messages with reality-based alternatives and positive, realistic self-talk that will increase his/her self-confidence in coping with irrational fears (see Cognitive Therapy of Anxiety Disorders by Gretta armin Mon). Teach the client problem-solving strategies involving specifically defining a problem, generating options for addressing it, evaluating the pros and cons of each option, selecting and implementing an optional action, and reevaluating and refining the action (or assign Applying Problem-Solving to Interpersonal Conflict in the Adult Psychotherapy Homework Planner by Jenniffer). Engage the client in behavioral activation, increasing the client's contact with sources of reward, identifying processes that inhibit activation, and teaching skills to solve life problems (or assign Identify and Schedule Pleasant Activities in the Adult Psychotherapy Homework Planner by Jenniffer); use behavioral techniques such as instruction, rehearsal, role-playing, role reversal as needed to assist adoption into the client's daily life; reinforce success. Discuss with the client the distinction between a lapse and relapse, associating a lapse with an initial and reversible return of worry, anxiety symptoms, or urges to avoid, and relapse with the decision to continue the fearful and avoidant patterns. Identify and rehearse with the client the management of future situations or circumstances in which lapses could occur. Instruct the client to routinely use new therapeutic skills (e.g.,  relaxation, cognitive restructuring, exposure, and problem-solving) in daily life to address emergent worries, anxiety, and avoidant tendencies. Develop a coping card on which coping strategies and other important information (e.g., Breathe deeply and relax, Challenge unrealistic worries, Use problem-solving) are written for the client's later use. Use techniques from Acceptance and Commitment Therapy to help client accept uncomfortable realities such as lack of complete control, imperfections, and uncertainty and tolerate unpleasant emotions and thoughts in order to accomplish value-consistent goals. Discuss how generalized anxiety typically involves excessive worry about unrealistic threats, various bodily expressions of tension, overarousal, and hypervigilance, and avoidance of what is threatening that interact to maintain the problem (see Mastery of Your Anxiety and Worry: Therapist Guide by Venson River, and Barlow; Treating Generalized Anxiety Disorder by Rygh and Red). Teach the client calming/relaxation skills (e.g., applied relaxation, progressive muscle relaxation, cue controlled relaxation; mindful breathing; biofeedback) and how to discriminate better between relaxation and tension; teach the client how to apply these skills to his/her daily life (e.g., New Directions in Progressive Muscle Relaxation by Thornell Collier, and Hazlett-Stevens; Treating Generalized Anxiety Disorder by Rygh and Red). Assign the client to read about progressive muscle relaxation and other calming strategies in relevant books or treatment manuals (e.g., Progressive Relaxation Training by Thornell and Collier; Mastery of Your Anxiety and Worry: Workbook by River armin Given). Explain the rationale for using a worry time as well as how it is to be used; agree upon and implement a worry time with the client. Teach the client how to recognize, stop, and postpone worry to the agreed upon worry  time using skills such as thought stopping, relaxation, and redirecting attention (or assign Making Use of the Thought-Stopping Technique and/or Worry Time in the Adult Psychotherapy Homework Planner by Jongsma to assist skill development); encourage use in daily life; review and reinforce success while providing corrective feedback toward improvement. Assist the client in analyzing his/her worries by examining potential biases such as the probability of the negative expectation occurring, the real consequences of it occurring, his/her ability to control the outcome, the worst possible outcome, and his/her ability to accept it (see Analyze the Probability of a Feared Event in the Adult Psychotherapy Homework Planner by  Jongsma; Cognitive Therapy of Anxiety Disorders by Gretta armin Mon). Encourage the client to share his/her thoughts and feelings of depression; express empathy and build rapport while identifying primary cognitive, behavioral, interpersonal, or other contributors to depression. Conduct Cognitive-Behavioral Therapy (see Cognitive Behavior Therapy by Mon; Overcoming Depression by Marine dunker al.), beginning with helping the client learn the connection among cognition, depressive feelings, and actions. Assign the client to self-monitor thoughts, feelings, and actions in daily journal (e.g., Negative Thoughts Trigger Negative Feelings in the Adult Psychotherapy Homework Planner by Jenniffer; Daily Record of Dysfunctional Thoughts in Cognitive Therapy of Depression by Mon Candida Gentry and Shona); process the journal material to challenge depressive thinking patterns and replace them with reality-based thoughts. Facilitate and reinforce the client's shift from biased depressive self-talk and beliefs to reality-based cognitive messages that enhance self-confidence and increase adaptive actions (see Positive Self-Talk in the Adult Psychotherapy Homework Planner by Jenniffer). Assist the client  in developing skills that increase the likelihood of deriving pleasure from behavioral activation (e.g., assertiveness skills, developing an exercise plan, less internal/more external focus, increased social involvement); reinforce success. Conduct Interpersonal Therapy (see Interpersonal Psychotherapy of Depression by Anne dunker al.), beginning with the assessment of the client's interpersonal inventory of important past and present relationships; develop a case formulation linking depression to grief, interpersonal role disputes, role transitions, and/or interpersonal deficits). Conduct Problem-Solving Therapy (see Problem-Solving Therapy by Francisco and Nezu) using techniques such as psychoeducation, modeling, and role-playing to teach client problem-solving skills (i.e., defining a problem specifically, generating possible solutions, evaluating the pros and cons of each solution, selecting and implementing a plan of action, evaluating the efficacy of the plan, accepting or revising the plan); role-play application of the problem-solving skill to a real life issue (or assign Applying Problem-Solving to Interpersonal Conflict in the Adult Psychotherapy Homework Planner by Jenniffer). Teach conflict resolution skills (e.g., empathy, active listening, I messages, respectful communication, assertiveness without aggression, compromise); use psychoeducation, modeling, role-playing, and rehearsal to work through several current conflicts; assign homework exercises; review and repeat so as to integrate their use into the client's life. Consistent with the treatment model, discuss how cognitive, behavioral, interpersonal, and/or other factors (e.g., family history) contribute to depression.  Diagnosis:Generalized anxiety disorder  Current mild episode of major depressive disorder without prior episode [facility])  Plan:  -call insurance agent and ask for help with providers that offer ERP and accept  medicare -drive interstate three times between sessions -meet again Wednesday, August 14, 2024 at 10am virtually.

## 2024-08-01 ENCOUNTER — Ambulatory Visit: Admitting: Physical Therapy

## 2024-08-01 DIAGNOSIS — M6281 Muscle weakness (generalized): Secondary | ICD-10-CM | POA: Diagnosis not present

## 2024-08-01 DIAGNOSIS — M25662 Stiffness of left knee, not elsewhere classified: Secondary | ICD-10-CM | POA: Diagnosis not present

## 2024-08-01 DIAGNOSIS — R2681 Unsteadiness on feet: Secondary | ICD-10-CM

## 2024-08-01 DIAGNOSIS — R279 Unspecified lack of coordination: Secondary | ICD-10-CM | POA: Diagnosis not present

## 2024-08-01 DIAGNOSIS — R262 Difficulty in walking, not elsewhere classified: Secondary | ICD-10-CM

## 2024-08-01 DIAGNOSIS — R269 Unspecified abnormalities of gait and mobility: Secondary | ICD-10-CM | POA: Diagnosis not present

## 2024-08-01 DIAGNOSIS — M25661 Stiffness of right knee, not elsewhere classified: Secondary | ICD-10-CM | POA: Diagnosis not present

## 2024-08-01 NOTE — Therapy (Unsigned)
 OUTPATIENT PHYSICAL THERAPY LOWER EXTREMITY TREATMENT/ RECERTIFICATION   Patient Name: Tom Underwood MRN: [id number] DOB:1951-06-17, 73 y.o., male Today's Date: 08/01/2024  END OF SESSION:  PT End of Session - 08/01/24 1114     Visit Number 18    Number of Visits 30    Date for PT Re-Evaluation 10/24/24    PT Start Time 1115    PT Stop Time 1220    PT Time Calculation (min) 65 min    Equipment Utilized During Treatment Gait belt    Activity Tolerance Patient tolerated treatment well;Patient limited by fatigue;Patient limited by pain    Behavior During Therapy WFL for tasks assessed/performed         Past Medical History:  Diagnosis Date   Anxiety    Arthritis    Atrial fibrillation (HCC)    a.) CHA2DS2-VASc = 4 (age, HTN, CVA x2). b.) s/p 200 J synchronized cardioversion (DCCV) on 12/08/2021. c.) rate/rythum maintained on oral metoprolol  tartrate; chronically anticoagulated using rivaroxaban .   BPH (benign prostatic hyperplasia)    DDD (degenerative disc disease), lumbar    Decreased libido    Depression    Diverticulitis    Diverticulosis    GERD (gastroesophageal reflux disease)    Hepatic steatosis    History of 2019 novel coronavirus disease (COVID-19) 12/28/2020   History of cardiac murmur in childhood    History of kidney stones    HLD (hyperlipidemia)    Hypertension    Hypogonadism in male    OSA on CPAP    Pre-diabetes    Rhinitis, allergic    Sleep apnea    Stroke (HCC) 07/23/2019   a.) LEFT posterior cor radiata. b.) residual RIGHT sided weakness   Past Surgical History:  Procedure Laterality Date   CARDIOVERSION N/A 12/08/2021   Procedure: CARDIOVERSION;  Surgeon: Mady Bruckner, MD;  Location: [facility] ORS;  Service: Cardiovascular;  Laterality: N/A;   COLONOSCOPY     COLONOSCOPY WITH PROPOFOL  N/A 10/06/2023   Procedure: COLONOSCOPY WITH PROPOFOL ;  Surgeon: Therisa Bi, MD;  Location: [facility] ENDOSCOPY;  Service: Gastroenterology;  Laterality: N/A;    EVALUATION UNDER ANESTHESIA WITH HEMORRHOIDECTOMY N/A 04/15/2022   Procedure: EXAM UNDER ANESTHESIA WITH HEMORRHOIDECTOMY;  Surgeon: Tye Millet, DO;  Location: [facility] ORS;  Service: General;  Laterality: N/A;   EXTERNAL EAR SURGERY     EYE SURGERY     FINGER SURGERY Right    lateration to 5 th digit   KNEE SURGERY     left eye     POLYPECTOMY  10/06/2023   Procedure: POLYPECTOMY;  Surgeon: Therisa Bi, MD;  Location: [facility] ENDOSCOPY;  Service: Gastroenterology;;   RECTAL EXAM UNDER ANESTHESIA N/A 04/23/2022   Procedure: RECTAL EXAM UNDER ANESTHESIA WITH LIGATION OF BLEEDING;  Surgeon: Desiderio Schanz, MD;  Location: [facility] ORS;  Service: General;  Laterality: N/A;   Patient Active Problem List   Diagnosis Date Noted   Chronic venous insufficiency of lower extremity 05/23/2024   History of atrial fibrillation 11/22/2023   Major depression, recurrent, chronic (HCC) 11/22/2023   Adenomatous polyp of colon 10/06/2023   Gastroesophageal reflux disease without esophagitis 05/17/2022   History of syncope 05/17/2022   History of hemorrhoidectomy 05/17/2022   Vitamin D  deficiency 05/17/2022   Lower extremity edema 05/17/2022   History of rectal bleeding 05/17/2022   Morbid obesity (HCC)    Hypertension    Hemiparesis of right dominant side as late effect of cerebral infarction (HCC) 08/13/2019   History of kidney stones 05/02/2018  ED (erectile dysfunction) 05/02/2018   Hyperglycemia 01/30/2017   Arthritis of knee, degenerative 01/30/2017   BPH with obstruction/lower urinary tract symptoms 02/03/2016   Dyslipidemia 08/04/2015   OSA on CPAP 06/30/2015   PCP: Glenard Mire, MD  REFERRING PROVIDER: Sowles, Krichna, MD  REFERRING DIAG:  [phone] (ICD-10-CM) - Hemiparesis of right dominant side as late effect of cerebral infarction (HCC)  R26.81 (ICD-10-CM) - Gait instability    THERAPY DIAG:  Muscle weakness (generalized)  Joint stiffness of both knees  Abnormality of gait and  mobility  Difficulty in walking, not elsewhere classified  Unsteadiness on feet  Rationale for Evaluation and Treatment: Rehabilitation  ONSET DATE: chronic  SUBJECTIVE:   SUBJECTIVE STATEMENT: Pt. Reports <5/10 B knee pain walking around PT clinic.  Pt. States he has been walking but limited by knee pain/ endurance.  Pt. Referred back to PT by MD since discharge from PT a few months ago due to regression in LE strength/ endurance.  Pt. Had a R sided stroke on 08/23/2019 and lives with wife and uses [facility] for safety with walking.  Pt. Fearful with walking on varying terrain/ curbs.  No recent falls reported.    PERTINENT HISTORY: Pt. Known well to PT clinic.    PAIN:  Are you having pain? Yes: NPRS scale: 4-5/10  Pain location: B knees Pain description: achy/ persistent Aggravating factors: walking/ step ups Relieving factors: rest  PRECAUTIONS: Fall  RED FLAGS: None   WEIGHT BEARING RESTRICTIONS: No  FALLS:  Has patient fallen in last 6 months? No  LIVING ENVIRONMENT: Lives with: lives with their spouse Lives in: Mobile home Stairs: Yes: External: 5 steps; can reach both Has following equipment at home: Single point cane  OCCUPATION: Retired  PLOF: Independent with household mobility with device  PATIENT GOALS: Increase R sided muscle strength/ walking endurance/ balance.    NEXT MD VISIT: PRN  OBJECTIVE:  Note: Objective measures were completed at Evaluation unless otherwise noted.  PATIENT SURVEYS:  LEFS: 18 out of 80.    COGNITION: Overall cognitive status: Within functional limits for tasks assessed     SENSATION: Light touch: Impaired   EDEMA:  Moderate B lower leg swelling.  Pt. Has compression stockings/ lymphedema pumps.  Marked improvement in lower leg swelling as compared to last PT tx. Session.     MUSCLE LENGTH: Hamstrings: NT Thomas test: Unable to test due to limited supine positioning  POSTURE: rounded shoulders, forward head, and  flexed trunk   PALPATION: (+) B knee joint line and lower leg tenderness.  Pitting edema noted in B lower legs (slightly improved from last PT tx. Session several months ago).    LOWER EXTREMITY ROM:  Active ROM Right eval Left eval  Hip flexion <90 deg. <90 deg.  Hip extension    Hip abduction    Hip adduction    Hip internal rotation    Hip external rotation    Knee flexion    Knee extension    Ankle dorsiflexion    Ankle plantarflexion    Ankle inversion    Ankle eversion     (Blank rows = not tested)  LOWER EXTREMITY MMT:  MMT Right eval Left eval  Hip flexion 4- 4  Hip extension    Hip abduction 4 4+  Hip adduction    Hip internal rotation 4+ 4+  Hip external rotation 4 4  Knee flexion 5 5  Knee extension 5 5  Ankle dorsiflexion    Ankle plantarflexion  Ankle inversion    Ankle eversion     (Blank rows = not tested)  LOWER EXTREMITY SPECIAL TESTS:  NT  FUNCTIONAL TESTS:  5 times sit to stand: 23.44 sec. (1st attempt), 12.67 sec. (2nd attempt).   Berg Balance Scale: TBD  GAIT: Distance walked: in clinic/ outside on sidewalk Assistive device utilized: Single point cane Level of assistance: Modified independence Comments: slow gait pattern with decreased arm swing, heel strike, reciprocal stride length. R foot ER.  No LOB during tx. Session.  Several seated rest breaks (vital WNL)  LEFS: 14/80  Berg: 42/56 5xSTS: 16.03 sec   MMT Right eval Left eval  Hip flexion 4+ 4+  Hip extension    Hip abduction 4+ 4+  Hip adduction 5 5  Hip internal rotation 4 4+  Hip external rotation 4 4+  Knee flexion 5  5   Knee extension 5* (with hand placement due to swelling) 5 * (with hand placement due to swelling)  Ankle dorsiflexion    Ankle plantarflexion    Ankle inversion    Ankle eversion     (Blank rows = not tested)                                                     TREATMENT DATE: 08/01/2024  Subjective:  Pt. reports mild 2/10 NPS for the L  knee, 3/10 NPS on the R knee at start of today's session. Pt. also reports that he was tired after last session and had to take a long nap after physical therapy.   There.act.:   Seated UBE: 8 min. F/b (maintaining 2 lights)- no Nustep today secondary to pt. concerned about increase in B knee pain   Alternating taps on 6 inch step in // bars, 2x30 sec.    Seated LAQs, 3#, and 20 reps each   Seated marches and ankle pumps, 20 reps each   Hallway Obstacle Course with CGA and use of SPC (6 inch hurdles, Airex pad, cone taps, and weaving in and out of cones) ~ 60 feet x 4 times  Discussed importance of daily walking/ activity/ HEP  Berg: 41/56 LEFS: 14/80  MMT Right eval Left eval  Hip flexion 4+ 4  Hip extension    Hip abduction 5 5  Hip adduction    Hip internal rotation 4+ 4+  Hip external rotation 4 4+  Knee flexion 5 5  Knee extension 5 5  Ankle dorsiflexion    Ankle plantarflexion    Ankle inversion    Ankle eversion     (Blank rows = not tested)  Not Performed on this day:  Forward and lateral step overs over 6 inch hurdles in //-bars (4 laps each) Nu-Step B UE/LE level 3 resistance, 12 minutes (seat 10)- discussed pain symptoms/ last PT tx. Session/ upcoming MD f/u.  Nautilus: standing lat. Pull downs with wand (60#)- 25x2.  TRX squats: 25x2 (modified knee flexion/ range)   Figure-8 Walking through cones in hallway with cone obstacles ~60 ft. with RW with no rest break required. (CGA) Figure-8 Walking through cones in hallway with cone obstacles without use of AD ~120 ft. (no standing rest break required) (CGA)  Lateral walking in //-bars (4 laps down and back) Standing marches during ambulation in // bars, ~90 ft. (CGA)   Ascending/descending 6-inch stairs, 16 reps  total (with SPC in left UE and use of right HR)  PATIENT EDUCATION:  Education details: Discussed walking program/ home ex. Person educated: Patient Education method: Software engineer Education comprehension: verbalized understanding and returned demonstration  HOME EXERCISE PROGRAM: Standing 3-way hip ex./ walking program.  ASSESSMENT:  CLINICAL IMPRESSION: Pt. Presents to PT with 2/10 L knee pain and 3/10 R knee pain. Baseline HR recorded at 64 bpm and oxygen saturation at 97%. Pt. was introduced to new activity on this day which was hallway obstacle course consisting of 6-inch hurdles, standing balance on Airex pad, cone taps, and weaving in and out of cones with CGA and use of SPC. Pt. With no LOBs during obstacle course but was noted to have difficulty with initial step onto unstable foam pad requiring an additional step with opposite LE and min A from therapist to prevent falling. Highest recorded HR after walking activities on this day was recorded at 71 bpm and oxygen saturation at it's lowest recorded at 95%. Retested LE MMTs and BERG (41/56) on this day which patient saw minimal improvement in since last session and reported pain with bilateral knee extension from therapist hand positioning due to swelling. Pt. will continue to benefit from skilled PT intervention to address decreased cardiovascular and LE endurance, and bilateral knee pain with functional activities.   Pt. Seen improvements in walking capacity with and without AD, posture/body mechanics, and gross LE strength since beginning therapy. Pt. Is still limited by decreased coordination and dynamic balance, gross LE strength, and decreased tolerance to activity which has made functional independence with daily tasks and walking household and community distances difficult to perform. Pt. Will continue to benefit from skilled physical therapy services 1x a week for 12 weeks in order to address current limitations, maximize independence with home activities, and return to functional activities with less pain and greater ease.   OBJECTIVE IMPAIRMENTS: Abnormal gait, cardiopulmonary status limiting  activity, decreased activity tolerance, decreased balance, decreased coordination, decreased endurance, decreased mobility, difficulty walking, decreased ROM, decreased strength, hypomobility, impaired flexibility, impaired sensation, impaired UE functional use, improper body mechanics, postural dysfunction, obesity, and pain.   ACTIVITY LIMITATIONS: carrying, lifting, bending, standing, squatting, stairs, transfers, bed mobility, bathing, toileting, dressing, hygiene/grooming, and locomotion level  PARTICIPATION LIMITATIONS: meal prep, cleaning, driving, shopping, and community activity  PERSONAL FACTORS: Past/current experiences are also affecting patient's functional outcome.   REHAB POTENTIAL: Good  CLINICAL DECISION MAKING: Evolving/moderate complexity  EVALUATION COMPLEXITY: Moderate   GOALS: Goals reviewed with patient? Yes  SHORT TERM GOALS: Target date: 09/04/24 Pt. Will increase B hip flexion to >90 deg. In supine position to improve strength/ step length with gait.   Baseline:  see above.  7/15: to be assessed next visit, pt unable to attain supine position  Goal status: Partially met  2.  Pt. Will complete 5xSTS in <10 sec. To improve mobility/ decrease fall risk.   Baseline: 5 times sit to stand: 23.44 sec. (1st attempt), 12.67 sec. (2nd attempt). 7/15: 16.03 sec  Goal status: Not met   LONG TERM GOALS: Target date: 10/24/24  Pt. Independent with HEP to increase B LE muscle strength 1/2 muscle grade to improve mobility/ decrease fall risk.   Baseline: see above. 7/15: strength maintained or improved 8:28: gross LE strength maintained Goal status: Partially met  2.  Pt. Will complete Berg balance test and score >48/56 to decrease fall risk/ improve walking with least assistive device.   Baseline:  43/56,  7/15: 42/56.  8/28: 40/56 Goal status: Not met  3.  Pt. Will increase LEFS to >30 out of 80 to improve pain-free functional mobility.   Baseline: 18 out of 80.  7/15: 14/80. 8/28: 14/80 Goal status: Not met   4.  Pt. Able to ambulate 15 minutes with use of SPC and consistent step pattern on outside surfaces.   Baseline:  increase B knee pain/ limited endurance. 7/15: not able to test due to weather  Goal status: Not met  PLAN:  PT FREQUENCY: 1-2x/week  PT DURATION: 12 weeks  PLANNED INTERVENTIONS: 97750- Physical Performance Testing, 97110-Therapeutic exercises, 97530- Therapeutic activity, 97112- Neuromuscular re-education, 97535- Self Care, [id number]- Manual therapy, [phone]- Gait training, [phone]- Electrical stimulation (unattended), Patient/Family education, Balance training, Stair training, Joint mobilization, Spinal mobilization, Cryotherapy, and Moist heat  PLAN FOR NEXT SESSION:  Consider outdoor walking with SPC or rolling walker and continue stair training as long as bilateral knee pain remains tolerable. 5xSTS retest.    Curtistine Bracket, SPT Tom Underwood, PT, DPT # [phone] Physical Therapist - [facility] 08/01/2024, 12:25 PM

## 2024-08-07 ENCOUNTER — Ambulatory Visit: Attending: Family Medicine | Admitting: Physical Therapy

## 2024-08-07 DIAGNOSIS — M25661 Stiffness of right knee, not elsewhere classified: Secondary | ICD-10-CM | POA: Insufficient documentation

## 2024-08-07 DIAGNOSIS — R262 Difficulty in walking, not elsewhere classified: Secondary | ICD-10-CM | POA: Diagnosis not present

## 2024-08-07 DIAGNOSIS — R279 Unspecified lack of coordination: Secondary | ICD-10-CM | POA: Diagnosis not present

## 2024-08-07 DIAGNOSIS — M25662 Stiffness of left knee, not elsewhere classified: Secondary | ICD-10-CM | POA: Diagnosis not present

## 2024-08-07 DIAGNOSIS — R2681 Unsteadiness on feet: Secondary | ICD-10-CM | POA: Diagnosis not present

## 2024-08-07 DIAGNOSIS — M6281 Muscle weakness (generalized): Secondary | ICD-10-CM | POA: Insufficient documentation

## 2024-08-07 DIAGNOSIS — R269 Unspecified abnormalities of gait and mobility: Secondary | ICD-10-CM | POA: Insufficient documentation

## 2024-08-07 NOTE — Therapy (Unsigned)
 OUTPATIENT PHYSICAL THERAPY LOWER EXTREMITY TREATMENT  Patient Name: Tom Underwood MRN: [id number] DOB:[birth year], 73 y.o., male Today's Date: 08/08/2024  END OF SESSION:  PT End of Session - 08/07/24 1302     Visit Number 19    Number of Visits 30    Date for PT Re-Evaluation 10/24/24    PT Start Time 1302    PT Stop Time 1409    PT Time Calculation (min) 67 min    Equipment Utilized During Treatment Gait belt    Activity Tolerance Patient tolerated treatment well;Patient limited by fatigue;Patient limited by pain    Behavior During Therapy WFL for tasks assessed/performed         Past Medical History:  Diagnosis Date   Anxiety    Arthritis    Atrial fibrillation (HCC)    a.) CHA2DS2-VASc = 4 (age, HTN, CVA x2). b.) s/p 200 J synchronized cardioversion (DCCV) on 12/08/2021. c.) rate/rythum maintained on oral metoprolol  tartrate; chronically anticoagulated using rivaroxaban .   BPH (benign prostatic hyperplasia)    DDD (degenerative disc disease), lumbar    Decreased libido    Depression    Diverticulitis    Diverticulosis    GERD (gastroesophageal reflux disease)    Hepatic steatosis    History of 2019 novel coronavirus disease (COVID-19) 12/28/2020   History of cardiac murmur in childhood    History of kidney stones    HLD (hyperlipidemia)    Hypertension    Hypogonadism in male    OSA on CPAP    Pre-diabetes    Rhinitis, allergic    Sleep apnea    Stroke (HCC) 07/23/2019   a.) LEFT posterior cor radiata. b.) residual RIGHT sided weakness   Past Surgical History:  Procedure Laterality Date   CARDIOVERSION N/A 12/08/2021   Procedure: CARDIOVERSION;  Surgeon: Mady Bruckner, MD;  Location: [facility] ORS;  Service: Cardiovascular;  Laterality: N/A;   COLONOSCOPY     COLONOSCOPY WITH PROPOFOL  N/A 10/06/2023   Procedure: COLONOSCOPY WITH PROPOFOL ;  Surgeon: Therisa Bi, MD;  Location: [facility] ENDOSCOPY;  Service: Gastroenterology;  Laterality: N/A;   EVALUATION UNDER  ANESTHESIA WITH HEMORRHOIDECTOMY N/A 04/15/2022   Procedure: EXAM UNDER ANESTHESIA WITH HEMORRHOIDECTOMY;  Surgeon: Tye Millet, DO;  Location: [facility] ORS;  Service: General;  Laterality: N/A;   EXTERNAL EAR SURGERY     EYE SURGERY     FINGER SURGERY Right    lateration to 5 th digit   KNEE SURGERY     left eye     POLYPECTOMY  10/06/2023   Procedure: POLYPECTOMY;  Surgeon: Therisa Bi, MD;  Location: [facility] ENDOSCOPY;  Service: Gastroenterology;;   RECTAL EXAM UNDER ANESTHESIA N/A 04/23/2022   Procedure: RECTAL EXAM UNDER ANESTHESIA WITH LIGATION OF BLEEDING;  Surgeon: Desiderio Schanz, MD;  Location: [facility] ORS;  Service: General;  Laterality: N/A;   Patient Active Problem List   Diagnosis Date Noted   Chronic venous insufficiency of lower extremity 05/23/2024   History of atrial fibrillation 11/22/2023   Major depression, recurrent, chronic (HCC) 11/22/2023   Adenomatous polyp of colon 10/06/2023   Gastroesophageal reflux disease without esophagitis 05/17/2022   History of syncope 05/17/2022   History of hemorrhoidectomy 05/17/2022   Vitamin D  deficiency 05/17/2022   Lower extremity edema 05/17/2022   History of rectal bleeding 05/17/2022   Morbid obesity (HCC)    Hypertension    Hemiparesis of right dominant side as late effect of cerebral infarction (HCC) 08/13/2019   History of kidney stones 05/02/2018   ED (  erectile dysfunction) 05/02/2018   Hyperglycemia 01/30/2017   Arthritis of knee, degenerative 01/30/2017   BPH with obstruction/lower urinary tract symptoms 02/03/2016   Dyslipidemia 08/04/2015   OSA on CPAP 06/30/2015   PCP: Sowles, Krichna, MD  REFERRING PROVIDER: Sowles, Krichna, MD  REFERRING DIAG:  [phone] (ICD-10-CM) - Hemiparesis of right dominant side as late effect of cerebral infarction (HCC)  R26.81 (ICD-10-CM) - Gait instability    THERAPY DIAG:  No diagnosis found.  Rationale for Evaluation and Treatment: Rehabilitation  ONSET DATE: chronic  SUBJECTIVE:    SUBJECTIVE STATEMENT: Pt. Reports <5/10 B knee pain walking around PT clinic.  Pt. States he has been walking but limited by knee pain/ endurance.  Pt. Referred back to PT by MD since discharge from PT a few months ago due to regression in LE strength/ endurance.  Pt. Had a R sided stroke on 08/23/2019 and lives with wife and uses [facility] for safety with walking.  Pt. Fearful with walking on varying terrain/ curbs.  No recent falls reported.    PERTINENT HISTORY: Pt. Known well to PT clinic.    PAIN:  Are you having pain? Yes: NPRS scale: 4-5/10  Pain location: B knees Pain description: achy/ persistent Aggravating factors: walking/ step ups Relieving factors: rest  PRECAUTIONS: Fall  RED FLAGS: None   WEIGHT BEARING RESTRICTIONS: No  FALLS:  Has patient fallen in last 6 months? No  LIVING ENVIRONMENT: Lives with: lives with their spouse Lives in: Mobile home Stairs: Yes: External: 5 steps; can reach both Has following equipment at home: Single point cane  OCCUPATION: Retired  PLOF: Independent with household mobility with device  PATIENT GOALS: Increase R sided muscle strength/ walking endurance/ balance.    NEXT MD VISIT: PRN  OBJECTIVE:  Note: Objective measures were completed at Evaluation unless otherwise noted.  PATIENT SURVEYS:  LEFS: 18 out of 80.    COGNITION: Overall cognitive status: Within functional limits for tasks assessed     SENSATION: Light touch: Impaired   EDEMA:  Moderate B lower leg swelling.  Pt. Has compression stockings/ lymphedema pumps.  Marked improvement in lower leg swelling as compared to last PT tx. Session.     MUSCLE LENGTH: Hamstrings: NT Thomas test: Unable to test due to limited supine positioning  POSTURE: rounded shoulders, forward head, and flexed trunk   PALPATION: (+) B knee joint line and lower leg tenderness.  Pitting edema noted in B lower legs (slightly improved from last PT tx. Session several months ago).     LOWER EXTREMITY ROM:  Active ROM Right eval Left eval  Hip flexion <90 deg. <90 deg.  Hip extension    Hip abduction    Hip adduction    Hip internal rotation    Hip external rotation    Knee flexion    Knee extension    Ankle dorsiflexion    Ankle plantarflexion    Ankle inversion    Ankle eversion     (Blank rows = not tested)  LOWER EXTREMITY MMT:  MMT Right eval Left eval  Hip flexion 4- 4  Hip extension    Hip abduction 4 4+  Hip adduction    Hip internal rotation 4+ 4+  Hip external rotation 4 4  Knee flexion 5 5  Knee extension 5 5  Ankle dorsiflexion    Ankle plantarflexion    Ankle inversion    Ankle eversion     (Blank rows = not tested)  LOWER EXTREMITY SPECIAL TESTS:  NT  FUNCTIONAL TESTS:  5 times sit to stand: 23.44 sec. (1st attempt), 12.67 sec. (2nd attempt).   Berg Balance Scale: TBD  GAIT: Distance walked: in clinic/ outside on sidewalk Assistive device utilized: Single point cane Level of assistance: Modified independence Comments: slow gait pattern with decreased arm swing, heel strike, reciprocal stride length. R foot ER.  No LOB during tx. Session.  Several seated rest breaks (vital WNL)  LEFS: 14/80  Berg: 42/56 5xSTS: 16.03 sec   Berg: 41/56 LEFS: 14/80  MMT Right eval Left eval  Hip flexion 4+ 4+  Hip extension    Hip abduction 4+ 4+  Hip adduction 5 5  Hip internal rotation 4 4+  Hip external rotation 4 4+  Knee flexion 5  5   Knee extension 5* (with hand placement due to swelling) 5 * (with hand placement due to swelling)  Ankle dorsiflexion    Ankle plantarflexion    Ankle inversion    Ankle eversion     (Blank rows = not tested)                                                     TREATMENT DATE: 08/08/2024  Subjective:  Pt. reports mild 2/10 NPS for the L knee, 3/10 NPS on the R knee at start of today's session. Pt. Reports that the lymphedema in his right LE has caused a burning sensation over the anterior  aspect of his right LE and notable swelling which made putting his socks on yesterday more challenging than usual.   There.act.:   Seated UBE: 6 min. F/b (maintaining 2 lights)- no Nustep today secondary to pt. concerned about burning sensation in RLE  Alternating taps on 6 inch step in // bars, 2x30 sec.    Seated LAQs and marches, 3#, and 20 reps each   Seated ankle pumps, 20 reps  Sit to stands, 2x10 (right LE burning sensation increased after first set)  Hallway Obstacle Course with CGA and use of SPC (6 inch hurdles, Airex pad, cone taps, cone taps across body while on foam pad, and weaving in and out of cones) ~ 60 feet x 5 times  Hallway Ambulation with SPC, 140 ft. (SOB)   There.ex.:  Discussed importance of daily walking/ activity/ HEP  MMT Right eval Left eval  Hip flexion 4+ 4  Hip extension    Hip abduction 5 5  Hip adduction    Hip internal rotation 4+ 4+  Hip external rotation 4 4+  Knee flexion 5 5  Knee extension 5 5  Ankle dorsiflexion    Ankle plantarflexion    Ankle inversion    Ankle eversion     (Blank rows = not tested)  Reviewed HEP  Not Performed on this day:  Forward and lateral step overs over 6 inch hurdles in //-bars (4 laps each) Nu-Step B UE/LE level 3 resistance, 12 minutes (seat 10)- discussed pain symptoms/ last PT tx. Session/ upcoming MD f/u.  Nautilus: standing lat. Pull downs with wand (60#)- 25x2.  TRX squats: 25x2 (modified knee flexion/ range)   Figure-8 Walking through cones in hallway with cone obstacles ~60 ft. with RW with no rest break required. (CGA) Figure-8 Walking through cones in hallway with cone obstacles without use of AD ~120 ft. (no standing rest break required) (CGA)  Lateral  walking in //-bars (4 laps down and back) Standing marches during ambulation in // bars, ~90 ft. (CGA)   Ascending/descending 6-inch stairs, 16 reps total (with SPC in left UE and use of right HR)  PATIENT EDUCATION:  Education details:  Discussed walking program/ home ex. Person educated: Patient Education method: Medical illustrator Education comprehension: verbalized understanding and returned demonstration  HOME EXERCISE PROGRAM: Standing 3-way hip ex./ walking program.  ASSESSMENT:  CLINICAL IMPRESSION: Pt. Presents to PT with 2/10 L knee pain and 3/10 R knee pain. Baseline HR recorded at 60 bpm and oxygen saturation at 98%. Pt. was introduced to new activity on this day during hallway obstacle course which was standing balance on Airex pad with cone taps and cone taps across body/outside BOS on firm surface. Pt. Demonstrated difficulty maintaining SLS on left LE on firm surface in order to reach right LE across body to tap cone but was able to do so after multiple initial attempts. Pt. with no LOBs during obstacle course but continues to have difficulty with initial step onto unstable foam pad requiring CGA from therapist to prevent falling. Highest recorded HR after walking activities on this day was recorded at 69 bpm and oxygen saturation at it's lowest recorded at 91% after hallway ambulation with SPC. Pt. will continue to benefit from skilled PT intervention to address decreased cardiovascular and LE endurance, and bilateral knee pain with functional activities.   OBJECTIVE IMPAIRMENTS: Abnormal gait, cardiopulmonary status limiting activity, decreased activity tolerance, decreased balance, decreased coordination, decreased endurance, decreased mobility, difficulty walking, decreased ROM, decreased strength, hypomobility, impaired flexibility, impaired sensation, impaired UE functional use, improper body mechanics, postural dysfunction, obesity, and pain.   ACTIVITY LIMITATIONS: carrying, lifting, bending, standing, squatting, stairs, transfers, bed mobility, bathing, toileting, dressing, hygiene/grooming, and locomotion level  PARTICIPATION LIMITATIONS: meal prep, cleaning, driving, shopping, and community  activity  PERSONAL FACTORS: Past/current experiences are also affecting patient's functional outcome.   REHAB POTENTIAL: Good  CLINICAL DECISION MAKING: Evolving/moderate complexity  EVALUATION COMPLEXITY: Moderate   GOALS: Goals reviewed with patient? Yes  SHORT TERM GOALS: Target date: 09/04/24 Pt. Will increase B hip flexion to >90 deg. In supine position to improve strength/ step length with gait.   Baseline:  see above.  7/15: to be assessed next visit, pt unable to attain supine position  Goal status: Partially met  2.  Pt. Will complete 5xSTS in <10 sec. To improve mobility/ decrease fall risk.   Baseline: 5 times sit to stand: 23.44 sec. (1st attempt), 12.67 sec. (2nd attempt). 7/15: 16.03 sec  Goal status: Not met   LONG TERM GOALS: Target date: 10/24/24  Pt. Independent with HEP to increase B LE muscle strength 1/2 muscle grade to improve mobility/ decrease fall risk.   Baseline: see above. 7/15: strength maintained or improved 8:28: gross LE strength maintained Goal status: Partially met  2.  Pt. Will complete Berg balance test and score >48/56 to decrease fall risk/ improve walking with least assistive device.   Baseline:  43/56,  7/15: 42/56. 8/28: 40/56 Goal status: Not met  3.  Pt. Will increase LEFS to >30 out of 80 to improve pain-free functional mobility.   Baseline: 18 out of 80. 7/15: 14/80. 8/28: 14/80 Goal status: Not met   4.  Pt. Able to ambulate 15 minutes with use of SPC and consistent step pattern on outside surfaces.   Baseline:  increase B knee pain/ limited endurance. 7/15: not able to test due to weather  Goal  status: Not met  PLAN:  PT FREQUENCY: 1-2x/week  PT DURATION: 12 weeks  PLANNED INTERVENTIONS: 97750- Physical Performance Testing, 97110-Therapeutic exercises, 97530- Therapeutic activity, 97112- Neuromuscular re-education, 97535- Self Care, [id number]- Manual therapy, [phone]- Gait training, [phone]- Electrical stimulation (unattended),  Patient/Family education, Balance training, Stair training, Joint mobilization, Spinal mobilization, Cryotherapy, and Moist heat  PLAN FOR NEXT SESSION:  Continue stair training as long as bilateral knee pain remains tolerable and challenge pt. With dynamic tasks involving reaching outside BOS. Retest 5xSTS.  10th visit progress note.     Curtistine Bracket, SPT Tom Underwood, PT, DPT # [phone] Physical Therapist - [facility] 08/08/2024, 7:20 AM

## 2024-08-08 ENCOUNTER — Encounter: Payer: Self-pay | Admitting: Physical Therapy

## 2024-08-14 ENCOUNTER — Encounter: Payer: Self-pay | Admitting: Professional

## 2024-08-14 ENCOUNTER — Ambulatory Visit: Admitting: Physical Therapy

## 2024-08-14 ENCOUNTER — Encounter: Payer: PRIVATE HEALTH INSURANCE | Admitting: Professional

## 2024-08-14 DIAGNOSIS — M6281 Muscle weakness (generalized): Secondary | ICD-10-CM | POA: Diagnosis not present

## 2024-08-14 DIAGNOSIS — R262 Difficulty in walking, not elsewhere classified: Secondary | ICD-10-CM

## 2024-08-14 DIAGNOSIS — R2681 Unsteadiness on feet: Secondary | ICD-10-CM

## 2024-08-14 DIAGNOSIS — M25662 Stiffness of left knee, not elsewhere classified: Secondary | ICD-10-CM | POA: Diagnosis not present

## 2024-08-14 DIAGNOSIS — R269 Unspecified abnormalities of gait and mobility: Secondary | ICD-10-CM | POA: Diagnosis not present

## 2024-08-14 DIAGNOSIS — M25661 Stiffness of right knee, not elsewhere classified: Secondary | ICD-10-CM | POA: Diagnosis not present

## 2024-08-14 NOTE — Therapy (Signed)
 OUTPATIENT PHYSICAL THERAPY LOWER EXTREMITY TREATMENT Physical Therapy Progress Note  Dates of reporting period  07/02/24   to   08/14/24   Patient Name: Tom Underwood MRN: [id number] DOB:[birth year], 73 y.o., male Today's Date: 08/14/2024  END OF SESSION:  PT End of Session - 08/14/24 1454     Visit Number 20    Number of Visits 30    Date for PT Re-Evaluation 10/24/24    PT Start Time 1438    PT Stop Time 1539    PT Time Calculation (min) 61 min    Equipment Utilized During Treatment Gait belt    Activity Tolerance Patient tolerated treatment well;Patient limited by fatigue;Patient limited by pain    Behavior During Therapy WFL for tasks assessed/performed         Past Medical History:  Diagnosis Date   Anxiety    Arthritis    Atrial fibrillation (HCC)    a.) CHA2DS2-VASc = 4 (age, HTN, CVA x2). b.) s/p 200 J synchronized cardioversion (DCCV) on 12/08/2021. c.) rate/rythum maintained on oral metoprolol  tartrate; chronically anticoagulated using rivaroxaban .   BPH (benign prostatic hyperplasia)    DDD (degenerative disc disease), lumbar    Decreased libido    Depression    Diverticulitis    Diverticulosis    GERD (gastroesophageal reflux disease)    Hepatic steatosis    History of 2019 novel coronavirus disease (COVID-19) 12/28/2020   History of cardiac murmur in childhood    History of kidney stones    HLD (hyperlipidemia)    Hypertension    Hypogonadism in male    OSA on CPAP    Pre-diabetes    Rhinitis, allergic    Sleep apnea    Stroke (HCC) 07/23/2019   a.) LEFT posterior cor radiata. b.) residual RIGHT sided weakness   Past Surgical History:  Procedure Laterality Date   CARDIOVERSION N/A 12/08/2021   Procedure: CARDIOVERSION;  Surgeon: Mady Bruckner, MD;  Location: [facility] ORS;  Service: Cardiovascular;  Laterality: N/A;   COLONOSCOPY     COLONOSCOPY WITH PROPOFOL  N/A 10/06/2023   Procedure: COLONOSCOPY WITH PROPOFOL ;  Surgeon: Therisa Bi, MD;   Location: [facility] ENDOSCOPY;  Service: Gastroenterology;  Laterality: N/A;   EVALUATION UNDER ANESTHESIA WITH HEMORRHOIDECTOMY N/A 04/15/2022   Procedure: EXAM UNDER ANESTHESIA WITH HEMORRHOIDECTOMY;  Surgeon: Tye Millet, DO;  Location: [facility] ORS;  Service: General;  Laterality: N/A;   EXTERNAL EAR SURGERY     EYE SURGERY     FINGER SURGERY Right    lateration to 5 th digit   KNEE SURGERY     left eye     POLYPECTOMY  10/06/2023   Procedure: POLYPECTOMY;  Surgeon: Therisa Bi, MD;  Location: [facility] ENDOSCOPY;  Service: Gastroenterology;;   RECTAL EXAM UNDER ANESTHESIA N/A 04/23/2022   Procedure: RECTAL EXAM UNDER ANESTHESIA WITH LIGATION OF BLEEDING;  Surgeon: Desiderio Schanz, MD;  Location: [facility] ORS;  Service: General;  Laterality: N/A;   Patient Active Problem List   Diagnosis Date Noted   Chronic venous insufficiency of lower extremity 05/23/2024   History of atrial fibrillation 11/22/2023   Major depression, recurrent, chronic (HCC) 11/22/2023   Adenomatous polyp of colon 10/06/2023   Gastroesophageal reflux disease without esophagitis 05/17/2022   History of syncope 05/17/2022   History of hemorrhoidectomy 05/17/2022   Vitamin D  deficiency 05/17/2022   Lower extremity edema 05/17/2022   History of rectal bleeding 05/17/2022   Morbid obesity (HCC)    Hypertension    Hemiparesis of right dominant side  as late effect of cerebral infarction (HCC) 08/13/2019   History of kidney stones 05/02/2018   ED (erectile dysfunction) 05/02/2018   Hyperglycemia 01/30/2017   Arthritis of knee, degenerative 01/30/2017   BPH with obstruction/lower urinary tract symptoms 02/03/2016   Dyslipidemia 08/04/2015   OSA on CPAP 06/30/2015   PCP: Glenard Mire, MD  REFERRING PROVIDER: Glenard Mire, MD  REFERRING DIAG:  [id number] (ICD-10-CM) - Hemiparesis of right dominant side as late effect of cerebral infarction (HCC)  R26.81 (ICD-10-CM) - Gait instability    THERAPY DIAG:  Muscle weakness  (generalized)  Abnormality of gait and mobility  Difficulty in walking, not elsewhere classified  Unsteadiness on feet  Joint stiffness of both knees  Rationale for Evaluation and Treatment: Rehabilitation  ONSET DATE: chronic  SUBJECTIVE:   SUBJECTIVE STATEMENT: Pt. Reports <5/10 B knee pain walking around PT clinic.  Pt. States he has been walking but limited by knee pain/ endurance.  Pt. Referred back to PT by MD since discharge from PT a few months ago due to regression in LE strength/ endurance.  Pt. Had a R sided stroke on 08/23/2019 and lives with wife and uses [facility] for safety with walking.  Pt. Fearful with walking on varying terrain/ curbs.  No recent falls reported.    PERTINENT HISTORY: Pt. Known well to PT clinic.    PAIN:  Are you having pain? Yes: NPRS scale: 4-5/10  Pain location: B knees Pain description: achy/ persistent Aggravating factors: walking/ step ups Relieving factors: rest  PRECAUTIONS: Fall  RED FLAGS: None   WEIGHT BEARING RESTRICTIONS: No  FALLS:  Has patient fallen in last 6 months? No  LIVING ENVIRONMENT: Lives with: lives with their spouse Lives in: Mobile home Stairs: Yes: External: 5 steps; can reach both Has following equipment at home: Single point cane  OCCUPATION: Retired  PLOF: Independent with household mobility with device  PATIENT GOALS: Increase R sided muscle strength/ walking endurance/ balance.    NEXT MD VISIT: PRN  OBJECTIVE:  Note: Objective measures were completed at Evaluation unless otherwise noted.  PATIENT SURVEYS:  LEFS: 18 out of 80.    COGNITION: Overall cognitive status: Within functional limits for tasks assessed     SENSATION: Light touch: Impaired   EDEMA:  Moderate B lower leg swelling.  Pt. Has compression stockings/ lymphedema pumps.  Marked improvement in lower leg swelling as compared to last PT tx. Session.     MUSCLE LENGTH: Hamstrings: NT Thomas test: Unable to test due to limited  supine positioning  POSTURE: rounded shoulders, forward head, and flexed trunk   PALPATION: (+) B knee joint line and lower leg tenderness.  Pitting edema noted in B lower legs (slightly improved from last PT tx. Session several months ago).    LOWER EXTREMITY ROM:  Active ROM Right eval Left eval  Hip flexion <90 deg. <90 deg.  Hip extension    Hip abduction    Hip adduction    Hip internal rotation    Hip external rotation    Knee flexion    Knee extension    Ankle dorsiflexion    Ankle plantarflexion    Ankle inversion    Ankle eversion     (Blank rows = not tested)  LOWER EXTREMITY MMT:  MMT Right eval Left eval  Hip flexion 4- 4  Hip extension    Hip abduction 4 4+  Hip adduction    Hip internal rotation 4+ 4+  Hip external rotation 4 4  Knee  flexion 5 5  Knee extension 5 5  Ankle dorsiflexion    Ankle plantarflexion    Ankle inversion    Ankle eversion     (Blank rows = not tested)  LOWER EXTREMITY SPECIAL TESTS:  NT  FUNCTIONAL TESTS:  5 times sit to stand: 23.44 sec. (1st attempt), 12.67 sec. (2nd attempt).   Berg Balance Scale: TBD  GAIT: Distance walked: in clinic/ outside on sidewalk Assistive device utilized: Single point cane Level of assistance: Modified independence Comments: slow gait pattern with decreased arm swing, heel strike, reciprocal stride length. R foot ER.  No LOB during tx. Session.  Several seated rest breaks (vital WNL)  LEFS: 14/80  Berg: 42/56 5xSTS: 16.03 sec   Berg: 41/56 LEFS: 14/80  MMT Right eval Left eval  Hip flexion 4+ 4+  Hip extension    Hip abduction 4+ 4+  Hip adduction 5 5  Hip internal rotation 4 4+  Hip external rotation 4 4+  Knee flexion 5  5   Knee extension 5* (with hand placement due to swelling) 5 * (with hand placement due to swelling)  Ankle dorsiflexion    Ankle plantarflexion    Ankle inversion    Ankle eversion     (Blank rows = not tested)                                                      MMT Right eval Left eval  Hip flexion 4+ 4  Hip extension    Hip abduction 5 5  Hip adduction    Hip internal rotation 4+ 4+  Hip external rotation 4 4+  Knee flexion 5 5  Knee extension 5 5  Ankle dorsiflexion    Ankle plantarflexion    Ankle inversion    Ankle eversion     (Blank rows = not tested)  TREATMENT DATE: 08/14/2024  Subjective:  Pt. reports moderate 5/10 NPS for the left knee, 4/10 NPS on the right knee at start of today's session. Pt. Reports that he has an appointment on Friday to discuss steroid injections for pain management of bilateral knees. Pt. Reports that the burning sensation he experienced over the anterior aspect of his right LE last week was due to a knot in his compression socks but the sensation and swelling dissipated when he removed the sock and applied ice to the region.  There.act.:   Nu-Step B UE/LE level 3 resistance, 10 minutes (seat 10)- discussed pain symptoms/ last PT tx. Session/ upcoming MD f/u.   Seated LAQs and marches, 3#, and 20 reps each   Seated ankle pumps, 20 reps  Hallway Ambulation with RW, 70 ft. (SOB)   Hallway ambulation with forward marches with RW, 70 ft. (SOB)  Ascending/descending 6-inch stairs, 16 steps total (with SPC in left UE and use of right HR while going up, reverse during descent)  Static SLS on firm surface, 3 sets to failure bilaterally (3 seconds best)  Standing on Airex pad with anterior cone taps, 1x12  Discussed importance of daily walking/ activity/ HEP  5xSTS: 12.3 seconds   PATIENT EDUCATION:  Education details: Discussed walking program/ home ex. Person educated: Patient Education method: Medical illustrator Education comprehension: verbalized understanding and returned demonstration  HOME EXERCISE PROGRAM: Standing 3-way hip ex./ walking program.  ASSESSMENT:  CLINICAL IMPRESSION: Pt.  Presents to PT with 5/10 L knee pain and 4/10 R knee pain. Baseline HR  recorded at 58 bpm and oxygen saturation at 97%. Pt. was introduced to new activity on this day which was SLS bilaterally on firm surface to failure which patient experienced difficulty maintaining position without use of UE support or CGA/min A from therapist to keep him upright. Pt. with no LOBs during hallway walking or stair training on this day. Updated 5xSTS test was recorded and patient improved time to 12.3 second and no increase in bilateral knee pain. Highest recorded HR after walking activities on this day was recorded at 64 bpm and oxygen saturation at it's lowest recorded at 95% after hallway ambulation with RW. Pt. will continue to benefit from skilled PT intervention to address decreased cardiovascular and LE endurance, and bilateral knee pain with functional activities.   OBJECTIVE IMPAIRMENTS: Abnormal gait, cardiopulmonary status limiting activity, decreased activity tolerance, decreased balance, decreased coordination, decreased endurance, decreased mobility, difficulty walking, decreased ROM, decreased strength, hypomobility, impaired flexibility, impaired sensation, impaired UE functional use, improper body mechanics, postural dysfunction, obesity, and pain.   ACTIVITY LIMITATIONS: carrying, lifting, bending, standing, squatting, stairs, transfers, bed mobility, bathing, toileting, dressing, hygiene/grooming, and locomotion level  PARTICIPATION LIMITATIONS: meal prep, cleaning, driving, shopping, and community activity  PERSONAL FACTORS: Past/current experiences are also affecting patient's functional outcome.   REHAB POTENTIAL: Good  CLINICAL DECISION MAKING: Evolving/moderate complexity  EVALUATION COMPLEXITY: Moderate   GOALS: Goals reviewed with patient? Yes  SHORT TERM GOALS: Target date: 09/04/24 Pt. Will increase B hip flexion to >90 deg. In supine position to improve strength/ step length with gait.   Baseline:  see above.  7/15: to be assessed next visit, pt unable  to attain supine position  Goal status: Partially met  2.  Pt. Will complete 5xSTS in <10 sec. To improve mobility/ decrease fall risk.   Baseline: 5 times sit to stand: 23.44 sec. (1st attempt), 12.67 sec. (2nd attempt). 7/15: 16.03 sec. 9/10: 12.3 sec. Goal status: Partially met   LONG TERM GOALS: Target date: 10/24/24  Pt. Independent with HEP to increase B LE muscle strength 1/2 muscle grade to improve mobility/ decrease fall risk.   Baseline: see above. 7/15: strength maintained or improved 8:28: gross LE strength maintained Goal status: Partially met  2.  Pt. Will complete Berg balance test and score >48/56 to decrease fall risk/ improve walking with least assistive device.   Baseline:  43/56,  7/15: 42/56. 8/28: 40/56 Goal status: Not met  3.  Pt. Will increase LEFS to >30 out of 80 to improve pain-free functional mobility.   Baseline: 18 out of 80. 7/15: 14/80. 8/28: 14/80 Goal status: Not met   4.  Pt. Able to ambulate 15 minutes with use of SPC and consistent step pattern on outside surfaces.   Baseline:  increase B knee pain/ limited endurance. 7/15: not able to test due to weather  Goal status: Not met  PLAN:  PT FREQUENCY: 1-2x/week  PT DURATION: 12 weeks  PLANNED INTERVENTIONS: 97750- Physical Performance Testing, 97110-Therapeutic exercises, 97530- Therapeutic activity, 97112- Neuromuscular re-education, 97535- Self Care, [id number]- Manual therapy, [phone]- Gait training, [phone]- Electrical stimulation (unattended), Patient/Family education, Balance training, Stair training, Joint mobilization, Spinal mobilization, Cryotherapy, and Moist heat  PLAN FOR NEXT SESSION:  Challenge pt. with dynamic ambulatory tasks with environmental obstacles and uneven surfaces.    Curtistine Bracket, SPT Ozell JAYSON Sero, PT, DPT # [phone] Physical Therapist - [facility]  08/14/2024, 3:53 PM

## 2024-08-14 NOTE — Progress Notes (Signed)
 A user error has taken place: encounter opened in error, closed for administrative reasons.               Tom Underwood, [facility]

## 2024-08-16 DIAGNOSIS — M170 Bilateral primary osteoarthritis of knee: Secondary | ICD-10-CM | POA: Diagnosis not present

## 2024-08-20 ENCOUNTER — Ambulatory Visit (INDEPENDENT_AMBULATORY_CARE_PROVIDER_SITE_OTHER): Payer: PRIVATE HEALTH INSURANCE | Admitting: Professional

## 2024-08-20 ENCOUNTER — Encounter: Payer: Self-pay | Admitting: Professional

## 2024-08-20 DIAGNOSIS — F411 Generalized anxiety disorder: Secondary | ICD-10-CM

## 2024-08-20 DIAGNOSIS — F320 Major depressive disorder, single episode, mild: Secondary | ICD-10-CM | POA: Diagnosis not present

## 2024-08-20 NOTE — Progress Notes (Addendum)
 [facility] Counselor/Therapist Progress Note  Patient ID: KAIRI TUFO, MRN: [id number],    Date: 08/20/2024  Time Spent: 45 minutes [id number]   Treatment Type: Individual Therapy  Risk Assessment: Danger to Self:  No Self-injurious Behavior: No Danger to Others: No Duty to Warn:no  Subjective: This session was held via audio teletherapy. The patient consented to audio teletherapy and was located at his home during this session. He is aware it is the responsibility of the patient to secure confidentiality on her end of the session. The provider was in a private home office for the duration of this session.    The patient arrived on time for his Caregility appointment.   Issues addressed: 1-homework a-call insurance agent and ask for help with providers that offer ERP and accept medicare b-drive interstate three times between sessions -he completed it twice -gonna find out him making it to the beach without him getting frustrated -his frustration being the time it takes to drive -discussed importance of him   2-trip to beach -pt not happy with time it takes -he knows of nine different ways to get to the beach -he uses the three ways that do not involve the interstate -discussed referring to the interstates as big roads and back roadsdd .;4s 5-treatment -discussed with pt's wife that he could benefit from ERP -pt's wife agreeable to helping pt look online since he has not heard back from his insurance and find a resource before next session  Treatment Plan Problems: Anxiety, Unipolar Depression, Posttraumatic Stress Disorder (PTSD), Grief / Loss Unresolved,  Symptoms: Excessive and/or unrealistic worry that is difficult to control occurring more days than not for at least 6 months about a number of events or activities. Motor tension (e.g., restlessness, tiredness, shakiness, muscle tension). Hypervigilance (e.g., feeling constantly on edge, experiencing  concentration difficulties, having trouble falling or staying asleep, exhibiting a general state of irritability). Thoughts dominated by loss coupled with poor concentration, tearful spells, and confusion about the future. Serial losses in life (i.e., deaths, divorces, jobs) that led to depression and discouragement. Strong emotional response of sadness exhibited when losses are discussed. Lack of appetite, weight loss, and/or insomnia as well as other depression signs that occurred since the loss. Has been exposed to a traumatic event involving actual or perceived threat of death or serious injury. Reports response of intense fear, helplessness, or horror to the traumatic event. Experiences disturbing and persistent thoughts, images, and/or perceptions of the traumatic event. Displays significant psychological and/or physiological distress resulting from internal and external clues that are reminiscent of the traumatic event. Intentionally avoids thoughts, feelings, or discussions related to the traumatic event. Intentionally avoids activities, places, people, or objects (e.g., up-armored vehicles) that evoke memories of the event. Displays a significant decline in interest and engagement in activities. Experiences disturbances in sleep. Reports difficulty concentrating as well as feelings of guilt. Reports hypervigilance. Symptoms present more than one month. Impairment in social, occupational, or other areas of functioning. Depressed or irritable mood. Decrease or loss of appetite. Diminished interest in or enjoyment of activities. Sleeplessness or hypersomnia. Lack of energy. Poor concentration and indecisiveness. Social withdrawal. Feelings of hopelessness, worthlessness, or inappropriate guilt. Unresolved grief issues. History of chronic or recurrent depression for which the client has taken antidepressant medication, been hospitalized, had outpatient treatment, or had a course of  electroconvulsive therapy. Goals: Learn and implement coping skills that result in a reduction of anxiety and worry, and improved daily functioning. Enhance ability to effectively  cope with the full variety of life's worries and anxieties. Stabilize anxiety level while increasing ability to function on a daily basis. Begin a healthy grieving process around the loss. Develop an awareness of how the avoidance of grieving has affected life and begin the healing process. Resolve the loss, reengaging in old relationships and initiating new contacts with others. Eliminate or reduce the negative impact trauma related symptoms have on social, occupational, and family functioning. Thinks about or openly discusses the traumatic event with others without experiencing psychological or physiological distress. No longer avoids persons, places, activities, and objects that are reminiscent of the traumatic event. Alleviate depressive symptoms and return to previous level of effective functioning. Recognize, accept, and cope with feelings of depression. Develop healthy thinking patterns and beliefs about self, others, and the world that lead to the alleviation and help prevent the relapse of depression. Appropriately grieve the loss in order to normalize mood and to return to previously adaptive level of functioning. Objectives target date for all objectives is 11/19/2024 Describe situations, thoughts, feelings, and actions associated with anxieties and worries, their impact on functioning, and attempts to resolve them.   50% Verbalize an understanding of the cognitive, physiological, and behavioral components of anxiety and its treatment.   40% Learn and implement calming skills to reduce overall anxiety and manage anxiety symptoms.  30% Learn and implement a strategy to limit the association between various environmental settings and worry, delaying the worry until a designated worry time.   20% Learn and  implement problem-solving strategies for realistically addressing worries.   40% Identify, challenge, and replace biased, fearful self-talk with positive, realistic, and empowering self-talk.   65% Verbalize an understanding of the role that cognitive biases play in excessive irrational worry and persistent anxiety symptoms.   65% Identify and engage in pleasant activities on a daily basis.  65% Learn and implement relapse prevention strategies for managing possible future anxiety symptoms.   40% Learn to accept limitations in life and commit to tolerating, rather than avoiding, unpleasant emotions while accomplishing meaningful goals.  40% Tell in detail the story of the current loss that is triggering symptoms.   100% Identify what stages of grief have been experienced in the continuum of the grieving process.   40% (has not grieved mother's death) Begin verbalizing feelings associated with the loss.   20% Verbalize resolution of feelings of guilt and regret associated with the loss. 30% Identify and voice positives about the deceased loved one including previous positive experiences, positive characteristics, positive aspects of the relationship, and how these things may be remembered.   10% Learn and implement calming skills.   50% Learn and implement guided self-dialogue to manage thoughts, feelings, and urges brought on by encounters with trauma-related situations.   30% Acknowledge the need to implement anger control techniques; learn and implement anger management techniques.   30% Implement a regular exercise regimen as a stress release technique.   50% Describe current and past experiences with depression including their impact on functioning and attempts to resolve it.   70% Verbalize an accurate understanding of depression.   70% Identify and replace thoughts and beliefs that support depression.   40% Learn and implement behavioral strategies to overcome depression.   60% Identify  important people in life, past and present, and describe the quality, good and poor, of those relationships.   30% Learn and implement problem-solving and decision-making skills.   30% Learn and implement conflict resolution skills to resolve interpersonal  problems.   30% Interventions: Use empathy, compassion, and support, allowing the client to tell in detail the story of his/her recent loss. Assign the client to make a list of all the regrets associated with actions toward or relationship with the deceased; process the list content toward resolution of these feelings. Assist the client in engaging in behaviors that celebrate the positive memorable aspects of the loved one and his/her life (e.g., placing memoriam in newspaper on anniversary of death, volunteering time to a favorite cause of the deceased person). Ask the client to discuss and/or list the positive aspects of and memories about his/her relationship with the lost loved one; reinforce the client's expression of positive memories and emotions (e.g., smiling, laughing); encourage the client to share these thoughts with supportive loved ones. Educate the client on the stages of the grieving process and answer any questions he/she may have. Assist the client in identifying the stages of grief that he/she has experienced and which stage he/she is presently working through. Ask the client to bring pictures or mementos connected with his/her loss to a session and talk about them (or assign Creating a Patent examiner in the Adult Psychotherapy Administrator, arts by Jenniffer). Assist the client in identifying and expressing feelings connected with his/her loss. Gently and sensitively explore the client's recollection of the facts of the traumatic incident and his/her cognitive and emotional reactions at the time; assess frequency, intensity, duration, and history of the client's PTSD symptoms and their impact on functioning (see How the Trauma  Affects Me in the Adult Psychotherapy Homework Planner by Jongsma); supplement with semi-structured assessment instrument if desired (see The Anxiety Disorders Interview Schedule - Adult Version). Using Cognitive Therapy techniques, explore the client's self-talk and beliefs about self, others, and the future that are a consequence of the trauma (e.g., themes of safety, trust, power, control, esteem, and intimacy); identify and challenge biases; assist him/her in generating appraisals that correct for the biases; test biased and alternatives predictions through behavioral experiments. Teach the client a guided self-dialogue procedure in which he/she learns to recognize maladaptive self-talk, challenges its biases, copes with engendered feelings, overcomes avoidance, and reinforces his/her accomplishments; review and reinforce progress, problem-solve obstacles. Utilize Eye Movement Desensitization and Reprocessing (EMDR) to reduce the client's emotional reactivity to the traumatic event and reduce PTSD symptoms. Assess the client for instances of poor anger management that have led to threats or actual violence that caused damage to property and/or injury to people (or assign Anger Journal in the Adult Psychotherapy Homework Planner by Jenniffer). Teach the client anger management techniques (see the Anger Control Problems chapter in this Planner). Develop and encourage a routine of physical exercise for the client. Teach the client calming skills (e.g., breathing retraining, relaxation, calming self-talk) to use in and between sessions when feeling overly distressed. Ask the client to describe his/her past experiences of anxiety and their impact on functioning; assess the focus, excessiveness, and uncontrollability of the worry and the type, frequency, intensity, and duration of his/her anxiety symptoms (consider using a structured interview such as The Anxiety Disorders Interview Schedule-Adult  Version). Explore the client's schema and self-talk that mediate his/her fear response; assist him/her in challenging the biases; replace the distorted messages with reality-based alternatives and positive, realistic self-talk that will increase his/her self-confidence in coping with irrational fears (see Cognitive Therapy of Anxiety Disorders by Gretta armin Mon). Teach the client problem-solving strategies involving specifically defining a problem, generating options for addressing it, evaluating the pros and cons  of each option, selecting and implementing an optional action, and reevaluating and refining the action (or assign Applying Problem-Solving to Interpersonal Conflict in the Adult Psychotherapy Homework Planner by Jenniffer). Engage the client in behavioral activation, increasing the client's contact with sources of reward, identifying processes that inhibit activation, and teaching skills to solve life problems (or assign Identify and Schedule Pleasant Activities in the Adult Psychotherapy Homework Planner by Jenniffer); use behavioral techniques such as instruction, rehearsal, role-playing, role reversal as needed to assist adoption into the client's daily life; reinforce success. Discuss with the client the distinction between a lapse and relapse, associating a lapse with an initial and reversible return of worry, anxiety symptoms, or urges to avoid, and relapse with the decision to continue the fearful and avoidant patterns. Identify and rehearse with the client the management of future situations or circumstances in which lapses could occur. Instruct the client to routinely use new therapeutic skills (e.g., relaxation, cognitive restructuring, exposure, and problem-solving) in daily life to address emergent worries, anxiety, and avoidant tendencies. Develop a coping card on which coping strategies and other important information (e.g., Breathe deeply and relax, Challenge unrealistic  worries, Use problem-solving) are written for the client's later use. Use techniques from Acceptance and Commitment Therapy to help client accept uncomfortable realities such as lack of complete control, imperfections, and uncertainty and tolerate unpleasant emotions and thoughts in order to accomplish value-consistent goals. Discuss how generalized anxiety typically involves excessive worry about unrealistic threats, various bodily expressions of tension, overarousal, and hypervigilance, and avoidance of what is threatening that interact to maintain the problem (see Mastery of Your Anxiety and Worry: Therapist Guide by Venson River, and Barlow; Treating Generalized Anxiety Disorder by Rygh and Red). Teach the client calming/relaxation skills (e.g., applied relaxation, progressive muscle relaxation, cue controlled relaxation; mindful breathing; biofeedback) and how to discriminate better between relaxation and tension; teach the client how to apply these skills to his/her daily life (e.g., New Directions in Progressive Muscle Relaxation by Thornell Collier, and Hazlett-Stevens; Treating Generalized Anxiety Disorder by Rygh and Red). Assign the client to read about progressive muscle relaxation and other calming strategies in relevant books or treatment manuals (e.g., Progressive Relaxation Training by Thornell and Collier; Mastery of Your Anxiety and Worry: Workbook by River armin Given). Explain the rationale for using a worry time as well as how it is to be used; agree upon and implement a worry time with the client. Teach the client how to recognize, stop, and postpone worry to the agreed upon worry time using skills such as thought stopping, relaxation, and redirecting attention (or assign Making Use of the Thought-Stopping Technique and/or Worry Time in the Adult Psychotherapy Homework Planner by Jongsma to assist skill development); encourage use in daily life; review and  reinforce success while providing corrective feedback toward improvement. Assist the client in analyzing his/her worries by examining potential biases such as the probability of the negative expectation occurring, the real consequences of it occurring, his/her ability to control the outcome, the worst possible outcome, and his/her ability to accept it (see Analyze the Probability of a Feared Event in the Adult Psychotherapy Homework Planner by Jenniffer; Cognitive Therapy of Anxiety Disorders by Gretta armin Mon). Encourage the client to share his/her thoughts and feelings of depression; express empathy and build rapport while identifying primary cognitive, behavioral, interpersonal, or other contributors to depression. Conduct Cognitive-Behavioral Therapy (see Cognitive Behavior Therapy by Mon; Overcoming Depression by Marine dunker al.), beginning with helping the client learn the connection  among cognition, depressive feelings, and actions. Assign the client to self-monitor thoughts, feelings, and actions in daily journal (e.g., Negative Thoughts Trigger Negative Feelings in the Adult Psychotherapy Homework Planner by Jenniffer; Daily Record of Dysfunctional Thoughts in Cognitive Therapy of Depression by Almarie Candida Gentry and Shona); process the journal material to challenge depressive thinking patterns and replace them with reality-based thoughts. Facilitate and reinforce the client's shift from biased depressive self-talk and beliefs to reality-based cognitive messages that enhance self-confidence and increase adaptive actions (see Positive Self-Talk in the Adult Psychotherapy Homework Planner by Jenniffer). Assist the client in developing skills that increase the likelihood of deriving pleasure from behavioral activation (e.g., assertiveness skills, developing an exercise plan, less internal/more external focus, increased social involvement); reinforce success. Conduct Interpersonal Therapy (see  Interpersonal Psychotherapy of Depression by Anne dunker al.), beginning with the assessment of the client's interpersonal inventory of important past and present relationships; develop a case formulation linking depression to grief, interpersonal role disputes, role transitions, and/or interpersonal deficits). Conduct Problem-Solving Therapy (see Problem-Solving Therapy by Francisco and Nezu) using techniques such as psychoeducation, modeling, and role-playing to teach client problem-solving skills (i.e., defining a problem specifically, generating possible solutions, evaluating the pros and cons of each solution, selecting and implementing a plan of action, evaluating the efficacy of the plan, accepting or revising the plan); role-play application of the problem-solving skill to a real life issue (or assign Applying Problem-Solving to Interpersonal Conflict in the Adult Psychotherapy Homework Planner by Jenniffer). Teach conflict resolution skills (e.g., empathy, active listening, I messages, respectful communication, assertiveness without aggression, compromise); use psychoeducation, modeling, role-playing, and rehearsal to work through several current conflicts; assign homework exercises; review and repeat so as to integrate their use into the client's life. Consistent with the treatment model, discuss how cognitive, behavioral, interpersonal, and/or other factors (e.g., family history) contribute to depression.  Diagnosis:Generalized anxiety disorder  Current mild episode of major depressive disorder without prior episode [facility])  Plan:  -enjoy self at beach -getting ERP appointment -meet again Wednesday, September 11, 2024 at 10am virtually.

## 2024-08-21 ENCOUNTER — Ambulatory Visit: Admitting: Physical Therapy

## 2024-08-21 DIAGNOSIS — R262 Difficulty in walking, not elsewhere classified: Secondary | ICD-10-CM

## 2024-08-21 DIAGNOSIS — R269 Unspecified abnormalities of gait and mobility: Secondary | ICD-10-CM | POA: Diagnosis not present

## 2024-08-21 DIAGNOSIS — M6281 Muscle weakness (generalized): Secondary | ICD-10-CM

## 2024-08-21 DIAGNOSIS — R2681 Unsteadiness on feet: Secondary | ICD-10-CM

## 2024-08-21 DIAGNOSIS — M25661 Stiffness of right knee, not elsewhere classified: Secondary | ICD-10-CM | POA: Diagnosis not present

## 2024-08-21 DIAGNOSIS — M25662 Stiffness of left knee, not elsewhere classified: Secondary | ICD-10-CM | POA: Diagnosis not present

## 2024-08-21 NOTE — Therapy (Signed)
 OUTPATIENT PHYSICAL THERAPY LOWER EXTREMITY TREATMENT  Patient Name: Tom Underwood MRN: [id number] DOB:Sep 27, 1951, 73 y.o., male Today's Date: 08/21/2024  END OF SESSION:  PT End of Session - 08/21/24 1354     Visit Number 21    Number of Visits 30    Date for PT Re-Evaluation 10/24/24    PT Start Time 1354    PT Stop Time 1441    PT Time Calculation (min) 47 min    Equipment Utilized During Treatment Gait belt    Activity Tolerance Patient tolerated treatment well;Patient limited by fatigue;Patient limited by pain    Behavior During Therapy WFL for tasks assessed/performed         Past Medical History:  Diagnosis Date   Anxiety    Arthritis    Atrial fibrillation (HCC)    a.) CHA2DS2-VASc = 4 (age, HTN, CVA x2). b.) s/p 200 J synchronized cardioversion (DCCV) on 12/08/2021. c.) rate/rythum maintained on oral metoprolol  tartrate; chronically anticoagulated using rivaroxaban .   BPH (benign prostatic hyperplasia)    DDD (degenerative disc disease), lumbar    Decreased libido    Depression    Diverticulitis    Diverticulosis    GERD (gastroesophageal reflux disease)    Hepatic steatosis    History of 2019 novel coronavirus disease (COVID-19) 12/28/2020   History of cardiac murmur in childhood    History of kidney stones    HLD (hyperlipidemia)    Hypertension    Hypogonadism in male    OSA on CPAP    Pre-diabetes    Rhinitis, allergic    Sleep apnea    Stroke (HCC) 07/23/2019   a.) LEFT posterior cor radiata. b.) residual RIGHT sided weakness   Past Surgical History:  Procedure Laterality Date   CARDIOVERSION N/A 12/08/2021   Procedure: CARDIOVERSION;  Surgeon: Mady Bruckner, MD;  Location: [facility] ORS;  Service: Cardiovascular;  Laterality: N/A;   COLONOSCOPY     COLONOSCOPY WITH PROPOFOL  N/A 10/06/2023   Procedure: COLONOSCOPY WITH PROPOFOL ;  Surgeon: Therisa Bi, MD;  Location: [facility] ENDOSCOPY;  Service: Gastroenterology;  Laterality: N/A;   EVALUATION UNDER  ANESTHESIA WITH HEMORRHOIDECTOMY N/A 04/15/2022   Procedure: EXAM UNDER ANESTHESIA WITH HEMORRHOIDECTOMY;  Surgeon: Tye Millet, DO;  Location: [facility] ORS;  Service: General;  Laterality: N/A;   EXTERNAL EAR SURGERY     EYE SURGERY     FINGER SURGERY Right    lateration to 5 th digit   KNEE SURGERY     left eye     POLYPECTOMY  10/06/2023   Procedure: POLYPECTOMY;  Surgeon: Therisa Bi, MD;  Location: [facility] ENDOSCOPY;  Service: Gastroenterology;;   RECTAL EXAM UNDER ANESTHESIA N/A 04/23/2022   Procedure: RECTAL EXAM UNDER ANESTHESIA WITH LIGATION OF BLEEDING;  Surgeon: Desiderio Schanz, MD;  Location: [facility] ORS;  Service: General;  Laterality: N/A;   Patient Active Problem List   Diagnosis Date Noted   Chronic venous insufficiency of lower extremity 05/23/2024   History of atrial fibrillation 11/22/2023   Major depression, recurrent, chronic (HCC) 11/22/2023   Adenomatous polyp of colon 10/06/2023   Gastroesophageal reflux disease without esophagitis 05/17/2022   History of syncope 05/17/2022   History of hemorrhoidectomy 05/17/2022   Vitamin D  deficiency 05/17/2022   Lower extremity edema 05/17/2022   History of rectal bleeding 05/17/2022   Morbid obesity (HCC)    Hypertension    Hemiparesis of right dominant side as late effect of cerebral infarction (HCC) 08/13/2019   History of kidney stones 05/02/2018   ED (  erectile dysfunction) 05/02/2018   Hyperglycemia 01/30/2017   Arthritis of knee, degenerative 01/30/2017   BPH with obstruction/lower urinary tract symptoms 02/03/2016   Dyslipidemia 08/04/2015   OSA on CPAP 06/30/2015   PCP: Glenard Mire, MD  REFERRING PROVIDER: Sowles, Krichna, MD  REFERRING DIAG:  [phone] (ICD-10-CM) - Hemiparesis of right dominant side as late effect of cerebral infarction (HCC)  R26.81 (ICD-10-CM) - Gait instability    THERAPY DIAG:  Muscle weakness (generalized)  Unsteadiness on feet  Joint stiffness of both knees  Difficulty in walking,  not elsewhere classified  Abnormality of gait and mobility  Rationale for Evaluation and Treatment: Rehabilitation  ONSET DATE: chronic  SUBJECTIVE:   SUBJECTIVE STATEMENT: Pt. Reports <5/10 B knee pain walking around PT clinic.  Pt. States he has been walking but limited by knee pain/ endurance.  Pt. Referred back to PT by MD since discharge from PT a few months ago due to regression in LE strength/ endurance.  Pt. Had a R sided stroke on 08/23/2019 and lives with wife and uses [facility] for safety with walking.  Pt. Fearful with walking on varying terrain/ curbs.  No recent falls reported.    PERTINENT HISTORY: Pt. Known well to PT clinic.    PAIN:  Are you having pain? Yes: NPRS scale: 4-5/10  Pain location: B knees Pain description: achy/ persistent Aggravating factors: walking/ step ups Relieving factors: rest  PRECAUTIONS: Fall  RED FLAGS: None   WEIGHT BEARING RESTRICTIONS: No  FALLS:  Has patient fallen in last 6 months? No  LIVING ENVIRONMENT: Lives with: lives with their spouse Lives in: Mobile home Stairs: Yes: External: 5 steps; can reach both Has following equipment at home: Single point cane  OCCUPATION: Retired  PLOF: Independent with household mobility with device  PATIENT GOALS: Increase R sided muscle strength/ walking endurance/ balance.    NEXT MD VISIT: PRN  OBJECTIVE:  Note: Objective measures were completed at Evaluation unless otherwise noted.  PATIENT SURVEYS:  LEFS: 18 out of 80.    COGNITION: Overall cognitive status: Within functional limits for tasks assessed     SENSATION: Light touch: Impaired   EDEMA:  Moderate B lower leg swelling.  Pt. Has compression stockings/ lymphedema pumps.  Marked improvement in lower leg swelling as compared to last PT tx. Session.     MUSCLE LENGTH: Hamstrings: NT Thomas test: Unable to test due to limited supine positioning  POSTURE: rounded shoulders, forward head, and flexed trunk    PALPATION: (+) B knee joint line and lower leg tenderness.  Pitting edema noted in B lower legs (slightly improved from last PT tx. Session several months ago).    LOWER EXTREMITY ROM:  Active ROM Right eval Left eval  Hip flexion <90 deg. <90 deg.  Hip extension    Hip abduction    Hip adduction    Hip internal rotation    Hip external rotation    Knee flexion    Knee extension    Ankle dorsiflexion    Ankle plantarflexion    Ankle inversion    Ankle eversion     (Blank rows = not tested)  LOWER EXTREMITY MMT:  MMT Right eval Left eval  Hip flexion 4- 4  Hip extension    Hip abduction 4 4+  Hip adduction    Hip internal rotation 4+ 4+  Hip external rotation 4 4  Knee flexion 5 5  Knee extension 5 5  Ankle dorsiflexion    Ankle plantarflexion  Ankle inversion    Ankle eversion     (Blank rows = not tested)  LOWER EXTREMITY SPECIAL TESTS:  NT  FUNCTIONAL TESTS:  5 times sit to stand: 23.44 sec. (1st attempt), 12.67 sec. (2nd attempt).   Berg Balance Scale: TBD  GAIT: Distance walked: in clinic/ outside on sidewalk Assistive device utilized: Single point cane Level of assistance: Modified independence Comments: slow gait pattern with decreased arm swing, heel strike, reciprocal stride length. R foot ER.  No LOB during tx. Session.  Several seated rest breaks (vital WNL)  LEFS: 14/80  Berg: 42/56 5xSTS: 16.03 sec   Berg: 41/56 LEFS: 14/80  MMT Right eval Left eval  Hip flexion 4+ 4+  Hip extension    Hip abduction 4+ 4+  Hip adduction 5 5  Hip internal rotation 4 4+  Hip external rotation 4 4+  Knee flexion 5  5   Knee extension 5* (with hand placement due to swelling) 5 * (with hand placement due to swelling)  Ankle dorsiflexion    Ankle plantarflexion    Ankle inversion    Ankle eversion     (Blank rows = not tested)                                                     MMT Right eval Left eval  Hip flexion 4+ 4  Hip extension     Hip abduction 5 5  Hip adduction    Hip internal rotation 4+ 4+  Hip external rotation 4 4+  Knee flexion 5 5  Knee extension 5 5  Ankle dorsiflexion    Ankle plantarflexion    Ankle inversion    Ankle eversion     (Blank rows = not tested) 08/14/24 5xSTS: 12.3 seconds  TREATMENT DATE: 08/21/2024  Subjective:  Pt. reports moderate 3/10 NPS for the left knee, 5/10 NPS on the right knee at start of today's session. Pt. reports that it was decided during his MD appointment on Friday that he will have steroid injections ordered for bilateral knees.   There.ex.:  Nu-Step B UE/LE level 3 resistance, 10 minutes (seat 10)- discussed pain symptoms/ last PT tx. Session/ upcoming MD f/u.   Seated LAQs and marches, 3#, and 20 reps each   Seated ankle pumps, 20 reps  There.act.:    Outdoors ambulation with RW, 425 ft. (CGA, no rest breaks required)   Taps to 6-inch step with alternating bilateral LEs, 2 x 20 reps (standing rest break in between, without use of UE support)  Discussed importance of daily walking/ activity/ HEP   PATIENT EDUCATION:  Education details: Discussed walking program/ home ex. Person educated: Patient Education method: Medical illustrator Education comprehension: verbalized understanding and returned demonstration  HOME EXERCISE PROGRAM: Standing 3-way hip ex./ walking program.  ASSESSMENT:  CLINICAL IMPRESSION: Pt. Presents to PT with 3/10 L knee pain and 5/10 R knee pain. Baseline HR recorded at 63 bpm and oxygen saturation at 96%. Pt. was introduced to new activity on this day which was outdoor walking over even and uneven surfaces (425 ft). Pt ambulated with RW and CGA with no LOBs during outdoor walking on this day and no rest break needed. Pt did report an increase in L knee pain to a 5/10 and R knee pain to a 6/10 after ambulation activities but  pain had returned to baseline levels after a seated rest break. Highest recorded HR after walking  activities on this day was recorded at 71 bpm and oxygen saturation at it's lowest recorded at 94% after outdoors ambulation with RW. Pt demonstrated improved ability to complete alternating marches to 6 inch step without the use of bilateral UE support. Pt continues to require long duration seated rest breaks after each activity to recover from SOB and systemic fatigue. Pt. will continue to benefit from skilled PT intervention to address decreased cardiovascular and LE endurance, and bilateral knee pain with functional activities.   OBJECTIVE IMPAIRMENTS: Abnormal gait, cardiopulmonary status limiting activity, decreased activity tolerance, decreased balance, decreased coordination, decreased endurance, decreased mobility, difficulty walking, decreased ROM, decreased strength, hypomobility, impaired flexibility, impaired sensation, impaired UE functional use, improper body mechanics, postural dysfunction, obesity, and pain.   ACTIVITY LIMITATIONS: carrying, lifting, bending, standing, squatting, stairs, transfers, bed mobility, bathing, toileting, dressing, hygiene/grooming, and locomotion level  PARTICIPATION LIMITATIONS: meal prep, cleaning, driving, shopping, and community activity  PERSONAL FACTORS: Past/current experiences are also affecting patient's functional outcome.   REHAB POTENTIAL: Good  CLINICAL DECISION MAKING: Evolving/moderate complexity  EVALUATION COMPLEXITY: Moderate   GOALS: Goals reviewed with patient? Yes  SHORT TERM GOALS: Target date: 09/04/24 Pt. Will increase B hip flexion to >90 deg. In supine position to improve strength/ step length with gait.   Baseline:  see above.  7/15: to be assessed next visit, pt unable to attain supine position  Goal status: Partially met  2.  Pt. Will complete 5xSTS in <10 sec. To improve mobility/ decrease fall risk.   Baseline: 5 times sit to stand: 23.44 sec. (1st attempt), 12.67 sec. (2nd attempt). 7/15: 16.03 sec. 9/10: 12.3  sec. Goal status: Partially met   LONG TERM GOALS: Target date: 10/24/24  Pt. Independent with HEP to increase B LE muscle strength 1/2 muscle grade to improve mobility/ decrease fall risk.   Baseline: see above. 7/15: strength maintained or improved 8:28: gross LE strength maintained Goal status: Partially met  2.  Pt. Will complete Berg balance test and score >48/56 to decrease fall risk/ improve walking with least assistive device.   Baseline:  43/56,  7/15: 42/56. 8/28: 40/56 Goal status: Not met  3.  Pt. Will increase LEFS to >30 out of 80 to improve pain-free functional mobility.   Baseline: 18 out of 80. 7/15: 14/80. 8/28: 14/80 Goal status: Not met   4.  Pt. Able to ambulate 15 minutes with use of SPC and consistent step pattern on outside surfaces.   Baseline:  increase B knee pain/ limited endurance. 7/15: not able to test due to weather  Goal status: Not met  PLAN:  PT FREQUENCY: 1-2x/week  PT DURATION: 12 weeks  PLANNED INTERVENTIONS: 97750- Physical Performance Testing, 97110-Therapeutic exercises, 97530- Therapeutic activity, 97112- Neuromuscular re-education, 97535- Self Care, [id number]- Manual therapy, [phone]- Gait training, [phone]- Electrical stimulation (unattended), Patient/Family education, Balance training, Stair training, Joint mobilization, Spinal mobilization, Cryotherapy, and Moist heat  PLAN FOR NEXT SESSION:  Improve functional endurance with dynamic ambulatory tasks on even and uneven surfaces with and without environmental obstacles.  Discuss beach trip.     Curtistine Bracket, SPT Ozell JAYSON Sero, PT, DPT # [phone] Physical Therapist - [facility] 08/21/2024, 3:10 PM

## 2024-08-28 ENCOUNTER — Encounter: Admitting: Physical Therapy

## 2024-09-04 ENCOUNTER — Encounter: Admitting: Physical Therapy

## 2024-09-11 ENCOUNTER — Encounter: Payer: Self-pay | Admitting: Professional

## 2024-09-11 ENCOUNTER — Encounter: Payer: Self-pay | Admitting: Physical Therapy

## 2024-09-11 ENCOUNTER — Ambulatory Visit: Payer: PRIVATE HEALTH INSURANCE | Admitting: Professional

## 2024-09-11 ENCOUNTER — Ambulatory Visit: Attending: Family Medicine | Admitting: Physical Therapy

## 2024-09-11 ENCOUNTER — Ambulatory Visit

## 2024-09-11 ENCOUNTER — Encounter: Payer: Self-pay | Admitting: Family Medicine

## 2024-09-11 DIAGNOSIS — M25662 Stiffness of left knee, not elsewhere classified: Secondary | ICD-10-CM | POA: Diagnosis not present

## 2024-09-11 DIAGNOSIS — M25661 Stiffness of right knee, not elsewhere classified: Secondary | ICD-10-CM | POA: Diagnosis not present

## 2024-09-11 DIAGNOSIS — M6281 Muscle weakness (generalized): Secondary | ICD-10-CM | POA: Insufficient documentation

## 2024-09-11 DIAGNOSIS — R2681 Unsteadiness on feet: Secondary | ICD-10-CM | POA: Insufficient documentation

## 2024-09-11 DIAGNOSIS — R269 Unspecified abnormalities of gait and mobility: Secondary | ICD-10-CM | POA: Insufficient documentation

## 2024-09-11 DIAGNOSIS — F320 Major depressive disorder, single episode, mild: Secondary | ICD-10-CM | POA: Diagnosis not present

## 2024-09-11 DIAGNOSIS — R262 Difficulty in walking, not elsewhere classified: Secondary | ICD-10-CM | POA: Diagnosis not present

## 2024-09-11 DIAGNOSIS — F411 Generalized anxiety disorder: Secondary | ICD-10-CM | POA: Diagnosis not present

## 2024-09-11 DIAGNOSIS — Z634 Disappearance and death of family member: Secondary | ICD-10-CM | POA: Diagnosis not present

## 2024-09-11 NOTE — Progress Notes (Signed)
 [facility] Counselor/Therapist Progress Note  Patient ID: Tom Underwood, MRN: [id number],    Date: 09/11/2024  Time Spent: 45 minutes 1001-1046am   Treatment Type: Individual Therapy  Risk Assessment: Danger to Self:  No Self-injurious Behavior: No Danger to Others: No Duty to Warn:no  Subjective: This session was held via audio teletherapy. The patient consented to audio teletherapy and was located at his home during this session. He is aware it is the responsibility of the patient to secure confidentiality on her end of the session. The provider was in a private home office for the duration of this session.    The patient arrived on time for his Caregility appointment.   Issues addressed: 1-homework a-getting ERP appointment -he spoke with a nurse in practice and needs to call back to speak with ERP practitioner -Therisa Petite [phone] he plans to call this afternoon after his PT appt 2-trip to beach, stayed three weeks a-he drove both ways and was able to  b-pt and wife had scooters to use and he was out more this time c-he felt relaxed and had fun d-he did experience some panic when getting on the sky wheel but was able to get himself calmed down e-he did not do a lot of driving once getting to [facility] f-rode scooters up/[place] -there was a young lady at the amusements that spoke very kindly and was helpful 5-treatment -discussed with pt's wife that he could benefit from ERP -pt's wife agreeable to helping pt look online since he has not heard back from his insurance and find a resource before next session  Treatment Plan Problems: Anxiety, Unipolar Depression, Posttraumatic Stress Disorder (PTSD), Grief / Loss Unresolved,  Symptoms: Excessive and/or unrealistic worry that is difficult to control occurring more days than not for at least 6 months about a number of events or activities. Motor  tension (e.g., restlessness, tiredness, shakiness, muscle tension). Hypervigilance (e.g., feeling constantly on edge, experiencing concentration difficulties, having trouble falling or staying asleep, exhibiting a general state of irritability). Thoughts dominated by loss coupled with poor concentration, tearful spells, and confusion about the future. Serial losses in life (i.e., deaths, divorces, jobs) that led to depression and discouragement. Strong emotional response of sadness exhibited when losses are discussed. Lack of appetite, weight loss, and/or insomnia as well as other depression signs that occurred since the loss. Has been exposed to a traumatic event involving actual or perceived threat of death or serious injury. Reports response of intense fear, helplessness, or horror to the traumatic event. Experiences disturbing and persistent thoughts, images, and/or perceptions of the traumatic event. Displays significant psychological and/or physiological distress resulting from internal and external clues that are reminiscent of the traumatic event. Intentionally avoids thoughts, feelings, or discussions related to the traumatic event. Intentionally avoids activities, places, people, or objects (e.g., up-armored vehicles) that evoke memories of the event. Displays a significant decline in interest and engagement in activities. Experiences disturbances in sleep. Reports difficulty concentrating as well as feelings of guilt. Reports hypervigilance. Symptoms present more than one month. Impairment in social, occupational, or other areas of functioning. Depressed or irritable mood. Decrease or loss of appetite. Diminished interest in or enjoyment of activities. Sleeplessness or hypersomnia. Lack of energy. Poor concentration and indecisiveness. Social withdrawal. Feelings of hopelessness, worthlessness, or inappropriate guilt. Unresolved grief issues. History of chronic or recurrent  depression for which the client has taken antidepressant medication, been hospitalized, had outpatient treatment, or  had a course of electroconvulsive therapy. Goals: Learn and implement coping skills that result in a reduction of anxiety and worry, and improved daily functioning. Enhance ability to effectively cope with the full variety of life's worries and anxieties. Stabilize anxiety level while increasing ability to function on a daily basis. Begin a healthy grieving process around the loss. Develop an awareness of how the avoidance of grieving has affected life and begin the healing process. Resolve the loss, reengaging in old relationships and initiating new contacts with others. Eliminate or reduce the negative impact trauma related symptoms have on social, occupational, and family functioning. Thinks about or openly discusses the traumatic event with others without experiencing psychological or physiological distress. No longer avoids persons, places, activities, and objects that are reminiscent of the traumatic event. Alleviate depressive symptoms and return to previous level of effective functioning. Recognize, accept, and cope with feelings of depression. Develop healthy thinking patterns and beliefs about self, others, and the world that lead to the alleviation and help prevent the relapse of depression. Appropriately grieve the loss in order to normalize mood and to return to previously adaptive level of functioning. Objectives target date for all objectives is 11/19/2024 Describe situations, thoughts, feelings, and actions associated with anxieties and worries, their impact on functioning, and attempts to resolve them.   50% Verbalize an understanding of the cognitive, physiological, and behavioral components of anxiety and its treatment.   40% Learn and implement calming skills to reduce overall anxiety and manage anxiety symptoms.  30% Learn and implement a strategy to limit the  association between various environmental settings and worry, delaying the worry until a designated worry time.   20% Learn and implement problem-solving strategies for realistically addressing worries.   40% Identify, challenge, and replace biased, fearful self-talk with positive, realistic, and empowering self-talk.   65% Verbalize an understanding of the role that cognitive biases play in excessive irrational worry and persistent anxiety symptoms.   65% Identify and engage in pleasant activities on a daily basis.  65% Learn and implement relapse prevention strategies for managing possible future anxiety symptoms.   40% Learn to accept limitations in life and commit to tolerating, rather than avoiding, unpleasant emotions while accomplishing meaningful goals.  40% Tell in detail the story of the current loss that is triggering symptoms.   100% Identify what stages of grief have been experienced in the continuum of the grieving process.   40% (has not grieved mother's death) Begin verbalizing feelings associated with the loss.   20% Verbalize resolution of feelings of guilt and regret associated with the loss. 30% Identify and voice positives about the deceased loved one including previous positive experiences, positive characteristics, positive aspects of the relationship, and how these things may be remembered.   10% Learn and implement calming skills.   50% Learn and implement guided self-dialogue to manage thoughts, feelings, and urges brought on by encounters with trauma-related situations.   30% Acknowledge the need to implement anger control techniques; learn and implement anger management techniques.   30% Implement a regular exercise regimen as a stress release technique.   50% Describe current and past experiences with depression including their impact on functioning and attempts to resolve it.   70% Verbalize an accurate understanding of depression.   70% Identify and replace thoughts  and beliefs that support depression.   40% Learn and implement behavioral strategies to overcome depression.   60% Identify important people in life, past and present, and describe the  quality, good and poor, of those relationships.   30% Learn and implement problem-solving and decision-making skills.   30% Learn and implement conflict resolution skills to resolve interpersonal problems.   30% Interventions: Use empathy, compassion, and support, allowing the client to tell in detail the story of his/her recent loss. Assign the client to make a list of all the regrets associated with actions toward or relationship with the deceased; process the list content toward resolution of these feelings. Assist the client in engaging in behaviors that celebrate the positive memorable aspects of the loved one and his/her life (e.g., placing memoriam in newspaper on anniversary of death, volunteering time to a favorite cause of the deceased person). Ask the client to discuss and/or list the positive aspects of and memories about his/her relationship with the lost loved one; reinforce the client's expression of positive memories and emotions (e.g., smiling, laughing); encourage the client to share these thoughts with supportive loved ones. Educate the client on the stages of the grieving process and answer any questions he/she may have. Assist the client in identifying the stages of grief that he/she has experienced and which stage he/she is presently working through. Ask the client to bring pictures or mementos connected with his/her loss to a session and talk about them (or assign Creating a Patent examiner in the Adult Psychotherapy Administrator, arts by Jenniffer). Assist the client in identifying and expressing feelings connected with his/her loss. Gently and sensitively explore the client's recollection of the facts of the traumatic incident and his/her cognitive and emotional reactions at the time; assess  frequency, intensity, duration, and history of the client's PTSD symptoms and their impact on functioning (see How the Trauma Affects Me in the Adult Psychotherapy Homework Planner by Jongsma); supplement with semi-structured assessment instrument if desired (see The Anxiety Disorders Interview Schedule - Adult Version). Using Cognitive Therapy techniques, explore the client's self-talk and beliefs about self, others, and the future that are a consequence of the trauma (e.g., themes of safety, trust, power, control, esteem, and intimacy); identify and challenge biases; assist him/her in generating appraisals that correct for the biases; test biased and alternatives predictions through behavioral experiments. Teach the client a guided self-dialogue procedure in which he/she learns to recognize maladaptive self-talk, challenges its biases, copes with engendered feelings, overcomes avoidance, and reinforces his/her accomplishments; review and reinforce progress, problem-solve obstacles. Utilize Eye Movement Desensitization and Reprocessing (EMDR) to reduce the client's emotional reactivity to the traumatic event and reduce PTSD symptoms. Assess the client for instances of poor anger management that have led to threats or actual violence that caused damage to property and/or injury to people (or assign Anger Journal in the Adult Psychotherapy Homework Planner by Jenniffer). Teach the client anger management techniques (see the Anger Control Problems chapter in this Planner). Develop and encourage a routine of physical exercise for the client. Teach the client calming skills (e.g., breathing retraining, relaxation, calming self-talk) to use in and between sessions when feeling overly distressed. Ask the client to describe his/her past experiences of anxiety and their impact on functioning; assess the focus, excessiveness, and uncontrollability of the worry and the type, frequency, intensity, and duration of  his/her anxiety symptoms (consider using a structured interview such as The Anxiety Disorders Interview Schedule-Adult Version). Explore the client's schema and self-talk that mediate his/her fear response; assist him/her in challenging the biases; replace the distorted messages with reality-based alternatives and positive, realistic self-talk that will increase his/her self-confidence in coping with irrational fears (see  Cognitive Therapy of Anxiety Disorders by Gretta armin Mon). Teach the client problem-solving strategies involving specifically defining a problem, generating options for addressing it, evaluating the pros and cons of each option, selecting and implementing an optional action, and reevaluating and refining the action (or assign Applying Problem-Solving to Interpersonal Conflict in the Adult Psychotherapy Homework Planner by Jenniffer). Engage the client in behavioral activation, increasing the client's contact with sources of reward, identifying processes that inhibit activation, and teaching skills to solve life problems (or assign Identify and Schedule Pleasant Activities in the Adult Psychotherapy Homework Planner by Jenniffer); use behavioral techniques such as instruction, rehearsal, role-playing, role reversal as needed to assist adoption into the client's daily life; reinforce success. Discuss with the client the distinction between a lapse and relapse, associating a lapse with an initial and reversible return of worry, anxiety symptoms, or urges to avoid, and relapse with the decision to continue the fearful and avoidant patterns. Identify and rehearse with the client the management of future situations or circumstances in which lapses could occur. Instruct the client to routinely use new therapeutic skills (e.g., relaxation, cognitive restructuring, exposure, and problem-solving) in daily life to address emergent worries, anxiety, and avoidant tendencies. Develop a coping card on  which coping strategies and other important information (e.g., Breathe deeply and relax, Challenge unrealistic worries, Use problem-solving) are written for the client's later use. Use techniques from Acceptance and Commitment Therapy to help client accept uncomfortable realities such as lack of complete control, imperfections, and uncertainty and tolerate unpleasant emotions and thoughts in order to accomplish value-consistent goals. Discuss how generalized anxiety typically involves excessive worry about unrealistic threats, various bodily expressions of tension, overarousal, and hypervigilance, and avoidance of what is threatening that interact to maintain the problem (see Mastery of Your Anxiety and Worry: Therapist Guide by Venson River, and Barlow; Treating Generalized Anxiety Disorder by Rygh and Red). Teach the client calming/relaxation skills (e.g., applied relaxation, progressive muscle relaxation, cue controlled relaxation; mindful breathing; biofeedback) and how to discriminate better between relaxation and tension; teach the client how to apply these skills to his/her daily life (e.g., New Directions in Progressive Muscle Relaxation by Thornell Collier, and Hazlett-Stevens; Treating Generalized Anxiety Disorder by Rygh and Red). Assign the client to read about progressive muscle relaxation and other calming strategies in relevant books or treatment manuals (e.g., Progressive Relaxation Training by Thornell and Collier; Mastery of Your Anxiety and Worry: Workbook by River armin Given). Explain the rationale for using a worry time as well as how it is to be used; agree upon and implement a worry time with the client. Teach the client how to recognize, stop, and postpone worry to the agreed upon worry time using skills such as thought stopping, relaxation, and redirecting attention (or assign Making Use of the Thought-Stopping Technique and/or Worry Time in the Adult  Psychotherapy Homework Planner by Jongsma to assist skill development); encourage use in daily life; review and reinforce success while providing corrective feedback toward improvement. Assist the client in analyzing his/her worries by examining potential biases such as the probability of the negative expectation occurring, the real consequences of it occurring, his/her ability to control the outcome, the worst possible outcome, and his/her ability to accept it (see Analyze the Probability of a Feared Event in the Adult Psychotherapy Homework Planner by Jenniffer; Cognitive Therapy of Anxiety Disorders by Gretta armin Mon). Encourage the client to share his/her thoughts and feelings of depression; express empathy and build rapport while identifying primary cognitive, behavioral,  interpersonal, or other contributors to depression. Conduct Cognitive-Behavioral Therapy (see Cognitive Behavior Therapy by Almarie; Overcoming Depression by Marine dunker al.), beginning with helping the client learn the connection among cognition, depressive feelings, and actions. Assign the client to self-monitor thoughts, feelings, and actions in daily journal (e.g., Negative Thoughts Trigger Negative Feelings in the Adult Psychotherapy Homework Planner by Jenniffer; Daily Record of Dysfunctional Thoughts in Cognitive Therapy of Depression by Almarie Candida Gentry and Shona); process the journal material to challenge depressive thinking patterns and replace them with reality-based thoughts. Facilitate and reinforce the client's shift from biased depressive self-talk and beliefs to reality-based cognitive messages that enhance self-confidence and increase adaptive actions (see Positive Self-Talk in the Adult Psychotherapy Homework Planner by Jenniffer). Assist the client in developing skills that increase the likelihood of deriving pleasure from behavioral activation (e.g., assertiveness skills, developing an exercise plan, less internal/more  external focus, increased social involvement); reinforce success. Conduct Interpersonal Therapy (see Interpersonal Psychotherapy of Depression by Anne dunker al.), beginning with the assessment of the client's interpersonal inventory of important past and present relationships; develop a case formulation linking depression to grief, interpersonal role disputes, role transitions, and/or interpersonal deficits). Conduct Problem-Solving Therapy (see Problem-Solving Therapy by Francisco and Nezu) using techniques such as psychoeducation, modeling, and role-playing to teach client problem-solving skills (i.e., defining a problem specifically, generating possible solutions, evaluating the pros and cons of each solution, selecting and implementing a plan of action, evaluating the efficacy of the plan, accepting or revising the plan); role-play application of the problem-solving skill to a real life issue (or assign Applying Problem-Solving to Interpersonal Conflict in the Adult Psychotherapy Homework Planner by Jenniffer). Teach conflict resolution skills (e.g., empathy, active listening, I messages, respectful communication, assertiveness without aggression, compromise); use psychoeducation, modeling, role-playing, and rehearsal to work through several current conflicts; assign homework exercises; review and repeat so as to integrate their use into the client's life. Consistent with the treatment model, discuss how cognitive, behavioral, interpersonal, and/or other factors (e.g., family history) contribute to depression.  Diagnosis:Generalized anxiety disorder  Current mild episode of major depressive disorder without prior episode  Uncomplicated bereavement  Plan:  -meet again Wednesday, September 25 2024 at 10am virtually.

## 2024-09-11 NOTE — Therapy (Signed)
 OUTPATIENT PHYSICAL THERAPY LOWER EXTREMITY TREATMENT  Patient Name: Tom Underwood MRN: [id number] DOB:09-08-1951, 73 y.o., male Today's Date: 09/11/2024  END OF SESSION:  PT End of Session - 09/11/24 1444     Visit Number 22    Number of Visits 30    Date for Recertification  10/24/24    PT Start Time 1438    Equipment Utilized During Treatment Gait belt    Activity Tolerance Patient tolerated treatment well;Patient limited by fatigue;Patient limited by pain    Behavior During Therapy [facility] for tasks assessed/performed         1438 to 1531  (53 minutes).    Past Medical History:  Diagnosis Date   Anxiety    Arthritis    Atrial fibrillation (HCC)    a.) CHA2DS2-VASc = 4 (age, HTN, CVA x2). b.) s/p 200 J synchronized cardioversion (DCCV) on 12/08/2021. c.) rate/rythum maintained on oral metoprolol  tartrate; chronically anticoagulated using rivaroxaban .   BPH (benign prostatic hyperplasia)    DDD (degenerative disc disease), lumbar    Decreased libido    Depression    Diverticulitis    Diverticulosis    GERD (gastroesophageal reflux disease)    Hepatic steatosis    History of 2019 novel coronavirus disease (COVID-19) 12/28/2020   History of cardiac murmur in childhood    History of kidney stones    HLD (hyperlipidemia)    Hypertension    Hypogonadism in male    OSA on CPAP    Pre-diabetes    Rhinitis, allergic    Sleep apnea    Stroke (HCC) 07/23/2019   a.) LEFT posterior cor radiata. b.) residual RIGHT sided weakness   Past Surgical History:  Procedure Laterality Date   CARDIOVERSION N/A 12/08/2021   Procedure: CARDIOVERSION;  Surgeon: Mady Bruckner, MD;  Location: [facility] ORS;  Service: Cardiovascular;  Laterality: N/A;   COLONOSCOPY     COLONOSCOPY WITH PROPOFOL  N/A 10/06/2023   Procedure: COLONOSCOPY WITH PROPOFOL ;  Surgeon: Therisa Bi, MD;  Location: [facility] ENDOSCOPY;  Service: Gastroenterology;  Laterality: N/A;   EVALUATION UNDER ANESTHESIA WITH  HEMORRHOIDECTOMY N/A 04/15/2022   Procedure: EXAM UNDER ANESTHESIA WITH HEMORRHOIDECTOMY;  Surgeon: Tye Millet, DO;  Location: [facility] ORS;  Service: General;  Laterality: N/A;   EXTERNAL EAR SURGERY     EYE SURGERY     FINGER SURGERY Right    lateration to 5 th digit   KNEE SURGERY     left eye     POLYPECTOMY  10/06/2023   Procedure: POLYPECTOMY;  Surgeon: Therisa Bi, MD;  Location: [facility] ENDOSCOPY;  Service: Gastroenterology;;   RECTAL EXAM UNDER ANESTHESIA N/A 04/23/2022   Procedure: RECTAL EXAM UNDER ANESTHESIA WITH LIGATION OF BLEEDING;  Surgeon: Desiderio Schanz, MD;  Location: [facility] ORS;  Service: General;  Laterality: N/A;   Patient Active Problem List   Diagnosis Date Noted   Chronic venous insufficiency of lower extremity 05/23/2024   History of atrial fibrillation 11/22/2023   Major depression, recurrent, chronic 11/22/2023   Adenomatous polyp of colon 10/06/2023   Gastroesophageal reflux disease without esophagitis 05/17/2022   History of syncope 05/17/2022   History of hemorrhoidectomy 05/17/2022   Vitamin D  deficiency 05/17/2022   Lower extremity edema 05/17/2022   History of rectal bleeding 05/17/2022   Morbid obesity (HCC)    Hypertension    Hemiparesis of right dominant side as late effect of cerebral infarction (HCC) 08/13/2019   History of kidney stones 05/02/2018   ED (erectile dysfunction) 05/02/2018   Hyperglycemia 01/30/2017  Arthritis of knee, degenerative 01/30/2017   BPH with obstruction/lower urinary tract symptoms 02/03/2016   Dyslipidemia 08/04/2015   OSA on CPAP 06/30/2015   PCP: Glenard Mire, MD  REFERRING PROVIDER: Sowles, Krichna, MD  REFERRING DIAG:  [phone] (ICD-10-CM) - Hemiparesis of right dominant side as late effect of cerebral infarction (HCC)  R26.81 (ICD-10-CM) - Gait instability    THERAPY DIAG:  Muscle weakness (generalized)  Unsteadiness on feet  Joint stiffness of both knees  Difficulty in walking, not elsewhere  classified  Rationale for Evaluation and Treatment: Rehabilitation  ONSET DATE: chronic  SUBJECTIVE:   SUBJECTIVE STATEMENT: Pt. Reports <5/10 B knee pain walking around PT clinic.  Pt. States he has been walking but limited by knee pain/ endurance.  Pt. Referred back to PT by MD since discharge from PT a few months ago due to regression in LE strength/ endurance.  Pt. Had a R sided stroke on 08/23/2019 and lives with wife and uses [facility] for safety with walking.  Pt. Fearful with walking on varying terrain/ curbs.  No recent falls reported.    PERTINENT HISTORY: Pt. Known well to PT clinic.    PAIN:  Are you having pain? Yes: NPRS scale: 4-5/10  Pain location: B knees Pain description: achy/ persistent Aggravating factors: walking/ step ups Relieving factors: rest  PRECAUTIONS: Fall  RED FLAGS: None   WEIGHT BEARING RESTRICTIONS: No  FALLS:  Has patient fallen in last 6 months? No  LIVING ENVIRONMENT: Lives with: lives with their spouse Lives in: Mobile home Stairs: Yes: External: 5 steps; can reach both Has following equipment at home: Single point cane  OCCUPATION: Retired  PLOF: Independent with household mobility with device  PATIENT GOALS: Increase R sided muscle strength/ walking endurance/ balance.    NEXT MD VISIT: PRN  OBJECTIVE:  Note: Objective measures were completed at Evaluation unless otherwise noted.  PATIENT SURVEYS:  LEFS: 18 out of 80.    COGNITION: Overall cognitive status: Within functional limits for tasks assessed     SENSATION: Light touch: Impaired   EDEMA:  Moderate B lower leg swelling.  Pt. Has compression stockings/ lymphedema pumps.  Marked improvement in lower leg swelling as compared to last PT tx. Session.     MUSCLE LENGTH: Hamstrings: NT Thomas test: Unable to test due to limited supine positioning  POSTURE: rounded shoulders, forward head, and flexed trunk   PALPATION: (+) B knee joint line and lower leg tenderness.   Pitting edema noted in B lower legs (slightly improved from last PT tx. Session several months ago).    LOWER EXTREMITY ROM:  Active ROM Right eval Left eval  Hip flexion <90 deg. <90 deg.  Hip extension    Hip abduction    Hip adduction    Hip internal rotation    Hip external rotation    Knee flexion    Knee extension    Ankle dorsiflexion    Ankle plantarflexion    Ankle inversion    Ankle eversion     (Blank rows = not tested)  LOWER EXTREMITY MMT:  MMT Right eval Left eval  Hip flexion 4- 4  Hip extension    Hip abduction 4 4+  Hip adduction    Hip internal rotation 4+ 4+  Hip external rotation 4 4  Knee flexion 5 5  Knee extension 5 5  Ankle dorsiflexion    Ankle plantarflexion    Ankle inversion    Ankle eversion     (Blank rows =  not tested)  LOWER EXTREMITY SPECIAL TESTS:  NT  FUNCTIONAL TESTS:  5 times sit to stand: 23.44 sec. (1st attempt), 12.67 sec. (2nd attempt).   Berg Balance Scale: TBD  GAIT: Distance walked: in clinic/ outside on sidewalk Assistive device utilized: Single point cane Level of assistance: Modified independence Comments: slow gait pattern with decreased arm swing, heel strike, reciprocal stride length. R foot ER.  No LOB during tx. Session.  Several seated rest breaks (vital WNL)  LEFS: 14/80  Berg: 42/56 5xSTS: 16.03 sec   Berg: 41/56 LEFS: 14/80  MMT Right eval Left eval  Hip flexion 4+ 4+  Hip extension    Hip abduction 4+ 4+  Hip adduction 5 5  Hip internal rotation 4 4+  Hip external rotation 4 4+  Knee flexion 5  5   Knee extension 5* (with hand placement due to swelling) 5 * (with hand placement due to swelling)  Ankle dorsiflexion    Ankle plantarflexion    Ankle inversion    Ankle eversion     (Blank rows = not tested)                                                     MMT Right eval Left eval  Hip flexion 4+ 4  Hip extension    Hip abduction 5 5  Hip adduction    Hip internal rotation 4+ 4+   Hip external rotation 4 4+  Knee flexion 5 5  Knee extension 5 5  Ankle dorsiflexion    Ankle plantarflexion    Ankle inversion    Ankle eversion     (Blank rows = not tested) 08/14/24 5xSTS: 12.3 seconds  TREATMENT DATE: 09/11/2024  Subjective:  Pt. reports moderate 4/10 NPS for the left knee, 5/10 NPS on the right knee at start of today's session.  Pt. Had a great time at the beach the last couple of weeks.  No falls or stumbles reported at beach.  Pt. Did rent a scooter for the entire time at the beach so he could go on boardwalk with wife.    There.act.:    Walking in hallway with use of RW and added 6 hurdles/ Airex pad  Lateral walking in //-bars with no UE assist.  Moderate cuing for posture correction.  Walking marches in //-bars with light to no UE assist with focus on upright posture/ balance.    Taps to 6-inch step with alternating bilateral LEs, 2 x 20 reps (standing rest break in between, without use of UE support)  There.ex.:  Standing Nautilus lat. Pull downs with bar (50#): 24x  Nu-Step B UE/LE level 3 resistance, 10 minutes (seat 10)- discussed pain symptoms/ last PT tx. Session/ upcoming MD f/u.   Seated LAQs and marches, 3#, and 20 reps each   Seated ankle pumps, 20 reps  Discussed importance of daily walking/ activity/ HEP   PATIENT EDUCATION:  Education details: Discussed walking program/ home ex. Person educated: Patient Education method: Medical illustrator Education comprehension: verbalized understanding and returned demonstration  HOME EXERCISE PROGRAM: Standing 3-way hip ex./ walking program.  ASSESSMENT:  CLINICAL IMPRESSION: Pt continues to require long duration seated rest breaks after each activity to recover from SOB and systemic fatigue.  Pt. Motivated to increase standing tolerance and walking endurance to promote greater community mobility.  No LOB during hallway walking with addition of hurdles/ Airex.  Persistent B knee  pain t/o tx. Session and no buckling noted.  Pt. will continue to benefit from skilled PT intervention to address decreased cardiovascular and LE endurance, and bilateral knee pain with functional activities.   OBJECTIVE IMPAIRMENTS: Abnormal gait, cardiopulmonary status limiting activity, decreased activity tolerance, decreased balance, decreased coordination, decreased endurance, decreased mobility, difficulty walking, decreased ROM, decreased strength, hypomobility, impaired flexibility, impaired sensation, impaired UE functional use, improper body mechanics, postural dysfunction, obesity, and pain.   ACTIVITY LIMITATIONS: carrying, lifting, bending, standing, squatting, stairs, transfers, bed mobility, bathing, toileting, dressing, hygiene/grooming, and locomotion level  PARTICIPATION LIMITATIONS: meal prep, cleaning, driving, shopping, and community activity  PERSONAL FACTORS: Past/current experiences are also affecting patient's functional outcome.   REHAB POTENTIAL: Good  CLINICAL DECISION MAKING: Evolving/moderate complexity  EVALUATION COMPLEXITY: Moderate   GOALS: Goals reviewed with patient? Yes  SHORT TERM GOALS: Target date: 09/04/24 Pt. Will increase B hip flexion to >90 deg. In supine position to improve strength/ step length with gait.   Baseline:  see above.  7/15: to be assessed next visit, pt unable to attain supine position  Goal status: Partially met  2.  Pt. Will complete 5xSTS in <10 sec. To improve mobility/ decrease fall risk.   Baseline: 5 times sit to stand: 23.44 sec. (1st attempt), 12.67 sec. (2nd attempt). 7/15: 16.03 sec. 9/10: 12.3 sec. Goal status: Partially met   LONG TERM GOALS: Target date: 10/24/24  Pt. Independent with HEP to increase B LE muscle strength 1/2 muscle grade to improve mobility/ decrease fall risk.   Baseline: see above. 7/15: strength maintained or improved 8:28: gross LE strength maintained Goal status: Partially met  2.  Pt.  Will complete Berg balance test and score >48/56 to decrease fall risk/ improve walking with least assistive device.   Baseline:  43/56,  7/15: 42/56. 8/28: 40/56 Goal status: Not met  3.  Pt. Will increase LEFS to >30 out of 80 to improve pain-free functional mobility.   Baseline: 18 out of 80. 7/15: 14/80. 8/28: 14/80 Goal status: Not met   4.  Pt. Able to ambulate 15 minutes with use of SPC and consistent step pattern on outside surfaces.   Baseline:  increase B knee pain/ limited endurance. 7/15: not able to test due to weather  Goal status: Not met  PLAN:  PT FREQUENCY: 1-2x/week  PT DURATION: 12 weeks  PLANNED INTERVENTIONS: 97750- Physical Performance Testing, 97110-Therapeutic exercises, 97530- Therapeutic activity, 97112- Neuromuscular re-education, 97535- Self Care, [id number]- Manual therapy, [phone]- Gait training, [phone]- Electrical stimulation (unattended), Patient/Family education, Balance training, Stair training, Joint mobilization, Spinal mobilization, Cryotherapy, and Moist heat  PLAN FOR NEXT SESSION:  Improve functional endurance with dynamic ambulatory tasks on even and uneven surfaces with and without environmental obstacles.     Curtistine Bracket, SPT Ozell JAYSON Sero, PT, DPT # [id number] Physical Therapist - [facility] 09/11/2024, 2:44 PM

## 2024-09-12 DIAGNOSIS — Z961 Presence of intraocular lens: Secondary | ICD-10-CM | POA: Diagnosis not present

## 2024-09-12 DIAGNOSIS — H2511 Age-related nuclear cataract, right eye: Secondary | ICD-10-CM | POA: Diagnosis not present

## 2024-09-12 DIAGNOSIS — H35371 Puckering of macula, right eye: Secondary | ICD-10-CM | POA: Diagnosis not present

## 2024-09-12 DIAGNOSIS — H40003 Preglaucoma, unspecified, bilateral: Secondary | ICD-10-CM | POA: Diagnosis not present

## 2024-09-17 ENCOUNTER — Ambulatory Visit: Admitting: Physical Therapy

## 2024-09-17 DIAGNOSIS — R2681 Unsteadiness on feet: Secondary | ICD-10-CM | POA: Diagnosis not present

## 2024-09-17 DIAGNOSIS — M25661 Stiffness of right knee, not elsewhere classified: Secondary | ICD-10-CM | POA: Diagnosis not present

## 2024-09-17 DIAGNOSIS — M25662 Stiffness of left knee, not elsewhere classified: Secondary | ICD-10-CM | POA: Diagnosis not present

## 2024-09-17 DIAGNOSIS — R269 Unspecified abnormalities of gait and mobility: Secondary | ICD-10-CM

## 2024-09-17 DIAGNOSIS — R262 Difficulty in walking, not elsewhere classified: Secondary | ICD-10-CM | POA: Diagnosis not present

## 2024-09-17 DIAGNOSIS — M6281 Muscle weakness (generalized): Secondary | ICD-10-CM | POA: Diagnosis not present

## 2024-09-17 NOTE — Therapy (Unsigned)
 OUTPATIENT PHYSICAL THERAPY LOWER EXTREMITY TREATMENT  Patient Name: Tom Underwood MRN: [id number] DOB:26-Aug-1951, 73 y.o., male Today's Date: 09/17/2024  END OF SESSION:  PT End of Session - 09/17/24 1119     Visit Number 23    Number of Visits 30    Date for Recertification  10/24/24    PT Start Time 1119    Equipment Utilized During Treatment Gait belt    Activity Tolerance Patient tolerated treatment well;Patient limited by fatigue;Patient limited by pain    Behavior During Therapy WFL for tasks assessed/performed          Past Medical History:  Diagnosis Date   Anxiety    Arthritis    Atrial fibrillation (HCC)    a.) CHA2DS2-VASc = 4 (age, HTN, CVA x2). b.) s/p 200 J synchronized cardioversion (DCCV) on 12/08/2021. c.) rate/rythum maintained on oral metoprolol  tartrate; chronically anticoagulated using rivaroxaban .   BPH (benign prostatic hyperplasia)    DDD (degenerative disc disease), lumbar    Decreased libido    Depression    Diverticulitis    Diverticulosis    GERD (gastroesophageal reflux disease)    Hepatic steatosis    History of 2019 novel coronavirus disease (COVID-19) 12/28/2020   History of cardiac murmur in childhood    History of kidney stones    HLD (hyperlipidemia)    Hypertension    Hypogonadism in male    OSA on CPAP    Pre-diabetes    Rhinitis, allergic    Sleep apnea    Stroke (HCC) 07/23/2019   a.) LEFT posterior cor radiata. b.) residual RIGHT sided weakness   Past Surgical History:  Procedure Laterality Date   CARDIOVERSION N/A 12/08/2021   Procedure: CARDIOVERSION;  Surgeon: Mady Bruckner, MD;  Location: [facility] ORS;  Service: Cardiovascular;  Laterality: N/A;   COLONOSCOPY     COLONOSCOPY WITH PROPOFOL  N/A 10/06/2023   Procedure: COLONOSCOPY WITH PROPOFOL ;  Surgeon: Therisa Bi, MD;  Location: [facility] ENDOSCOPY;  Service: Gastroenterology;  Laterality: N/A;   EVALUATION UNDER ANESTHESIA WITH HEMORRHOIDECTOMY N/A 04/15/2022    Procedure: EXAM UNDER ANESTHESIA WITH HEMORRHOIDECTOMY;  Surgeon: Tye Millet, DO;  Location: [facility] ORS;  Service: General;  Laterality: N/A;   EXTERNAL EAR SURGERY     EYE SURGERY     FINGER SURGERY Right    lateration to 5 th digit   KNEE SURGERY     left eye     POLYPECTOMY  10/06/2023   Procedure: POLYPECTOMY;  Surgeon: Therisa Bi, MD;  Location: [facility] ENDOSCOPY;  Service: Gastroenterology;;   RECTAL EXAM UNDER ANESTHESIA N/A 04/23/2022   Procedure: RECTAL EXAM UNDER ANESTHESIA WITH LIGATION OF BLEEDING;  Surgeon: Desiderio Schanz, MD;  Location: [facility] ORS;  Service: General;  Laterality: N/A;   Patient Active Problem List   Diagnosis Date Noted   Chronic venous insufficiency of lower extremity 05/23/2024   History of atrial fibrillation 11/22/2023   Major depression, recurrent, chronic 11/22/2023   Adenomatous polyp of colon 10/06/2023   Gastroesophageal reflux disease without esophagitis 05/17/2022   History of syncope 05/17/2022   History of hemorrhoidectomy 05/17/2022   Vitamin D  deficiency 05/17/2022   Lower extremity edema 05/17/2022   History of rectal bleeding 05/17/2022   Morbid obesity (HCC)    Hypertension    Hemiparesis of right dominant side as late effect of cerebral infarction (HCC) 08/13/2019   History of kidney stones 05/02/2018   ED (erectile dysfunction) 05/02/2018   Hyperglycemia 01/30/2017   Arthritis of knee, degenerative 01/30/2017  BPH with obstruction/lower urinary tract symptoms 02/03/2016   Dyslipidemia 08/04/2015   OSA on CPAP 06/30/2015   PCP: Sowles, Krichna, MD  REFERRING PROVIDER: Sowles, Krichna, MD  REFERRING DIAG:  [phone] (ICD-10-CM) - Hemiparesis of right dominant side as late effect of cerebral infarction (HCC)  R26.81 (ICD-10-CM) - Gait instability    THERAPY DIAG:  No diagnosis found.  Rationale for Evaluation and Treatment: Rehabilitation  ONSET DATE: chronic  SUBJECTIVE:   SUBJECTIVE STATEMENT: Pt. Reports <5/10 B knee  pain walking around PT clinic.  Pt. States he has been walking but limited by knee pain/ endurance.  Pt. Referred back to PT by MD since discharge from PT a few months ago due to regression in LE strength/ endurance.  Pt. Had a R sided stroke on 08/23/2019 and lives with wife and uses [facility] for safety with walking.  Pt. Fearful with walking on varying terrain/ curbs.  No recent falls reported.    PERTINENT HISTORY: Pt. Known well to PT clinic.    PAIN:  Are you having pain? Yes: NPRS scale: 4-5/10  Pain location: B knees Pain description: achy/ persistent Aggravating factors: walking/ step ups Relieving factors: rest  PRECAUTIONS: Fall  RED FLAGS: None   WEIGHT BEARING RESTRICTIONS: No  FALLS:  Has patient fallen in last 6 months? No  LIVING ENVIRONMENT: Lives with: lives with their spouse Lives in: Mobile home Stairs: Yes: External: 5 steps; can reach both Has following equipment at home: Single point cane  OCCUPATION: Retired  PLOF: Independent with household mobility with device  PATIENT GOALS: Increase R sided muscle strength/ walking endurance/ balance.    NEXT MD VISIT: PRN  OBJECTIVE:  Note: Objective measures were completed at Evaluation unless otherwise noted.  PATIENT SURVEYS:  LEFS: 18 out of 80.    COGNITION: Overall cognitive status: Within functional limits for tasks assessed     SENSATION: Light touch: Impaired   EDEMA:  Moderate B lower leg swelling.  Pt. Has compression stockings/ lymphedema pumps.  Marked improvement in lower leg swelling as compared to last PT tx. Session.     MUSCLE LENGTH: Hamstrings: NT Thomas test: Unable to test due to limited supine positioning  POSTURE: rounded shoulders, forward head, and flexed trunk   PALPATION: (+) B knee joint line and lower leg tenderness.  Pitting edema noted in B lower legs (slightly improved from last PT tx. Session several months ago).    LOWER EXTREMITY ROM:  Active ROM Right eval  Left eval  Hip flexion <90 deg. <90 deg.  Hip extension    Hip abduction    Hip adduction    Hip internal rotation    Hip external rotation    Knee flexion    Knee extension    Ankle dorsiflexion    Ankle plantarflexion    Ankle inversion    Ankle eversion     (Blank rows = not tested)  LOWER EXTREMITY MMT:  MMT Right eval Left eval  Hip flexion 4- 4  Hip extension    Hip abduction 4 4+  Hip adduction    Hip internal rotation 4+ 4+  Hip external rotation 4 4  Knee flexion 5 5  Knee extension 5 5  Ankle dorsiflexion    Ankle plantarflexion    Ankle inversion    Ankle eversion     (Blank rows = not tested)  LOWER EXTREMITY SPECIAL TESTS:  NT  FUNCTIONAL TESTS:  5 times sit to stand: 23.44 sec. (1st attempt), 12.67 sec. (  2nd attempt).   Berg Balance Scale: TBD  GAIT: Distance walked: in clinic/ outside on sidewalk Assistive device utilized: Single point cane Level of assistance: Modified independence Comments: slow gait pattern with decreased arm swing, heel strike, reciprocal stride length. R foot ER.  No LOB during tx. Session.  Several seated rest breaks (vital WNL)  LEFS: 14/80  Berg: 42/56 5xSTS: 16.03 sec   Berg: 41/56 LEFS: 14/80  MMT Right eval Left eval  Hip flexion 4+ 4+  Hip extension    Hip abduction 4+ 4+  Hip adduction 5 5  Hip internal rotation 4 4+  Hip external rotation 4 4+  Knee flexion 5  5   Knee extension 5* (with hand placement due to swelling) 5 * (with hand placement due to swelling)  Ankle dorsiflexion    Ankle plantarflexion    Ankle inversion    Ankle eversion     (Blank rows = not tested)                                                     MMT Right eval Left eval  Hip flexion 4+ 4  Hip extension    Hip abduction 5 5  Hip adduction    Hip internal rotation 4+ 4+  Hip external rotation 4 4+  Knee flexion 5 5  Knee extension 5 5  Ankle dorsiflexion    Ankle plantarflexion    Ankle inversion    Ankle eversion      (Blank rows = not tested) 08/14/24 5xSTS: 12.3 seconds  TREATMENT DATE: 09/17/2024  Subjective:  Pt. reports moderate 3/10 NPS for the left knee, 4/10 NPS on the right knee at start of today's session. Pt states that he leaves [place] trip [place] next [place].   There.act.:    Walking in hallway with use of RW and added 6 hurdles/ Airex pad/ cones to weave in and out of, ~ 40 ft  Forward marches in hallway with use of RW, ~ 40 ft  Lateral walking in //-bars with no UE assist.  Moderate cuing for posture correction. 2x down and back  Taps to 6-inch step with alternating bilateral LEs, 2 x 20 reps (standing rest break in between, without use of UE support)  There.ex.:  Standing Nautilus lat. Pull downs with bar (50#): 24x  Nu-Step B UE/LE level 3-4 resistance, 10 minutes (seat 10)- discussed pain symptoms/ importance of daily walking/ activity/ HEP  Seated LAQs and marches, 3#, and 20 reps each   Seated ankle pumps, 20 reps  Prior to ambulation: spO2: 95% HR: 53 bpm Post ambulation: spO2: 94% HR: 59 bpm  PATIENT EDUCATION:  Education details: Discussed walking program/ home ex. Person educated: Patient Education method: Medical illustrator Education comprehension: verbalized understanding and returned demonstration  HOME EXERCISE PROGRAM: Standing 3-way hip ex./ walking program.  ASSESSMENT:  CLINICAL IMPRESSION: Pt continues to require long duration seated rest breaks after each activity to recover from SOB and general, systemic fatigue. Pt with improved ability to perform sit to stand transfers without assistance from therapist. Pt demonstrated good technique of overcoming gravity with standing hip flexion with walking marches and use of RW in hallway but noted to fatigue after ~15-20 ft with decreased gait speed and hip flexion. Vitals prior to ambulation: spO2: 95% HR: 53 bpm and  recorded post ambulation: spO2: 94% HR: 59 bpm. Will continue to  monitor and progress as able.   OBJECTIVE IMPAIRMENTS: Abnormal gait, cardiopulmonary status limiting activity, decreased activity tolerance, decreased balance, decreased coordination, decreased endurance, decreased mobility, difficulty walking, decreased ROM, decreased strength, hypomobility, impaired flexibility, impaired sensation, impaired UE functional use, improper body mechanics, postural dysfunction, obesity, and pain.   ACTIVITY LIMITATIONS: carrying, lifting, bending, standing, squatting, stairs, transfers, bed mobility, bathing, toileting, dressing, hygiene/grooming, and locomotion level  PARTICIPATION LIMITATIONS: meal prep, cleaning, driving, shopping, and community activity  PERSONAL FACTORS: Past/current experiences are also affecting patient's functional outcome.   REHAB POTENTIAL: Good  CLINICAL DECISION MAKING: Evolving/moderate complexity  EVALUATION COMPLEXITY: Moderate   GOALS: Goals reviewed with patient? Yes  SHORT TERM GOALS: Target date: 09/04/24 Pt. Will increase B hip flexion to >90 deg. In supine position to improve strength/ step length with gait.   Baseline:  see above.  7/15: to be assessed next visit, pt unable to attain supine position  Goal status: Partially met  2.  Pt. Will complete 5xSTS in <10 sec. To improve mobility/ decrease fall risk.   Baseline: 5 times sit to stand: 23.44 sec. (1st attempt), 12.67 sec. (2nd attempt). 7/15: 16.03 sec. 9/10: 12.3 sec. Goal status: Partially met   LONG TERM GOALS: Target date: 10/24/24  Pt. Independent with HEP to increase B LE muscle strength 1/2 muscle grade to improve mobility/ decrease fall risk.   Baseline: see above. 7/15: strength maintained or improved 8:28: gross LE strength maintained Goal status: Partially met  2.  Pt. Will complete Berg balance test and score >48/56 to decrease fall risk/ improve walking with least assistive device.   Baseline:  43/56,  7/15: 42/56. 8/28: 40/56 Goal status:  Not met  3.  Pt. Will increase LEFS to >30 out of 80 to improve pain-free functional mobility.   Baseline: 18 out of 80. 7/15: 14/80. 8/28: 14/80 Goal status: Not met   4.  Pt. Able to ambulate 15 minutes with use of SPC and consistent step pattern on outside surfaces.   Baseline:  increase B knee pain/ limited endurance. 7/15: not able to test due to weather  Goal status: Not met  PLAN:  PT FREQUENCY: 1-2x/week  PT DURATION: 12 weeks  PLANNED INTERVENTIONS: 97750- Physical Performance Testing, 97110-Therapeutic exercises, 97530- Therapeutic activity, 97112- Neuromuscular re-education, 97535- Self Care, [id number]- Manual therapy, [phone]- Gait training, [phone]- Electrical stimulation (unattended), Patient/Family education, Balance training, Stair training, Joint mobilization, Spinal mobilization, Cryotherapy, and Moist heat  PLAN FOR NEXT SESSION:  Improve functional endurance with dynamic ambulatory tasks on even and uneven surfaces with and without environmental obstacles.     Curtistine Bracket, SPT Ozell JAYSON Sero, PT, DPT # [phone] Physical Therapist - [facility] 09/17/2024, 11:20 AM

## 2024-09-18 ENCOUNTER — Encounter: Payer: Self-pay | Admitting: Physical Therapy

## 2024-09-25 ENCOUNTER — Ambulatory Visit: Admitting: Physical Therapy

## 2024-09-25 ENCOUNTER — Ambulatory Visit: Payer: PRIVATE HEALTH INSURANCE | Admitting: Professional

## 2024-10-02 ENCOUNTER — Ambulatory Visit: Admitting: Physical Therapy

## 2024-10-02 DIAGNOSIS — R269 Unspecified abnormalities of gait and mobility: Secondary | ICD-10-CM

## 2024-10-02 DIAGNOSIS — R262 Difficulty in walking, not elsewhere classified: Secondary | ICD-10-CM

## 2024-10-02 DIAGNOSIS — R2681 Unsteadiness on feet: Secondary | ICD-10-CM

## 2024-10-02 DIAGNOSIS — M25661 Stiffness of right knee, not elsewhere classified: Secondary | ICD-10-CM

## 2024-10-02 DIAGNOSIS — M6281 Muscle weakness (generalized): Secondary | ICD-10-CM

## 2024-10-02 DIAGNOSIS — M25662 Stiffness of left knee, not elsewhere classified: Secondary | ICD-10-CM | POA: Diagnosis not present

## 2024-10-02 NOTE — Therapy (Unsigned)
 OUTPATIENT PHYSICAL THERAPY LOWER EXTREMITY TREATMENT  Patient Name: Tom Underwood MRN: [id number] DOB:07-Jul-1951, 73 y.o., male Today's Date: 10/02/2024  END OF SESSION:  PT End of Session - 10/02/24 1439     Visit Number 24    Number of Visits 30    Date for Recertification  10/24/24    PT Start Time 1438    PT Stop Time 1529    PT Time Calculation (min) 51 min    Equipment Utilized During Treatment Gait belt    Activity Tolerance Patient tolerated treatment well;Patient limited by pain;Patient limited by fatigue    Behavior During Therapy [facility] for tasks assessed/performed         Past Medical History:  Diagnosis Date   Anxiety    Arthritis    Atrial fibrillation (HCC)    a.) CHA2DS2-VASc = 4 (age, HTN, CVA x2). b.) s/p 200 J synchronized cardioversion (DCCV) on 12/08/2021. c.) rate/rythum maintained on oral metoprolol  tartrate; chronically anticoagulated using rivaroxaban .   BPH (benign prostatic hyperplasia)    DDD (degenerative disc disease), lumbar    Decreased libido    Depression    Diverticulitis    Diverticulosis    GERD (gastroesophageal reflux disease)    Hepatic steatosis    History of 2019 novel coronavirus disease (COVID-19) 12/28/2020   History of cardiac murmur in childhood    History of kidney stones    HLD (hyperlipidemia)    Hypertension    Hypogonadism in male    OSA on CPAP    Pre-diabetes    Rhinitis, allergic    Sleep apnea    Stroke (HCC) 07/23/2019   a.) LEFT posterior cor radiata. b.) residual RIGHT sided weakness   Past Surgical History:  Procedure Laterality Date   CARDIOVERSION N/A 12/08/2021   Procedure: CARDIOVERSION;  Surgeon: Mady Bruckner, MD;  Location: [facility] ORS;  Service: Cardiovascular;  Laterality: N/A;   COLONOSCOPY     COLONOSCOPY WITH PROPOFOL  N/A 10/06/2023   Procedure: COLONOSCOPY WITH PROPOFOL ;  Surgeon: Therisa Bi, MD;  Location: [facility] ENDOSCOPY;  Service: Gastroenterology;  Laterality: N/A;   EVALUATION UNDER  ANESTHESIA WITH HEMORRHOIDECTOMY N/A 04/15/2022   Procedure: EXAM UNDER ANESTHESIA WITH HEMORRHOIDECTOMY;  Surgeon: Tye Millet, DO;  Location: [facility] ORS;  Service: General;  Laterality: N/A;   EXTERNAL EAR SURGERY     EYE SURGERY     FINGER SURGERY Right    lateration to 5 th digit   KNEE SURGERY     left eye     POLYPECTOMY  10/06/2023   Procedure: POLYPECTOMY;  Surgeon: Therisa Bi, MD;  Location: [facility]COPY;  Service: Gastroenterology;;   RECTAL EXAM UNDER ANESTHESIA N/A 04/23/2022   Procedure: RECTAL EXAM UNDER ANESTHESIA WITH LIGATION OF BLEEDING;  Surgeon: Desiderio Schanz, MD;  Location: [facility] ORS;  Service: General;  Laterality: N/A;   Patient Active Problem List   Diagnosis Date Noted   Chronic venous insufficiency of lower extremity 05/23/2024   History of atrial fibrillation 11/22/2023   Major depression, recurrent, chronic 11/22/2023   Adenomatous polyp of colon 10/06/2023   Gastroesophageal reflux disease without esophagitis 05/17/2022   History of syncope 05/17/2022   History of hemorrhoidectomy 05/17/2022   Vitamin D  deficiency 05/17/2022   Lower extremity edema 05/17/2022   History of rectal bleeding 05/17/2022   Morbid obesity (HCC)    Hypertension    Hemiparesis of right dominant side as late effect of cerebral infarction (HCC) 08/13/2019   History of kidney stones 05/02/2018   ED (erectile  dysfunction) 05/02/2018   Hyperglycemia 01/30/2017   Arthritis of knee, degenerative 01/30/2017   BPH with obstruction/lower urinary tract symptoms 02/03/2016   Dyslipidemia 08/04/2015   OSA on CPAP 06/30/2015   PCP: Glenard Mire, MD  REFERRING PROVIDER: Sowles, Krichna, MD  REFERRING DIAG:  [phone] (ICD-10-CM) - Hemiparesis of right dominant side as late effect of cerebral infarction (HCC)  R26.81 (ICD-10-CM) - Gait instability    THERAPY DIAG:  Muscle weakness (generalized)  Unsteadiness on feet  Abnormality of gait and mobility  Joint stiffness of both  knees  Difficulty in walking, not elsewhere classified  Rationale for Evaluation and Treatment: Rehabilitation  ONSET DATE: chronic  SUBJECTIVE:   SUBJECTIVE STATEMENT: Pt. Reports <5/10 B knee pain walking around PT clinic.  Pt. States he has been walking but limited by knee pain/ endurance.  Pt. Referred back to PT by MD since discharge from PT a few months ago due to regression in LE strength/ endurance.  Pt. Had a R sided stroke on 08/23/2019 and lives with wife and uses [facility] for safety with walking.  Pt. Fearful with walking on varying terrain/ curbs.  No recent falls reported.    PERTINENT HISTORY: Pt. Known well to PT clinic.    PAIN:  Are you having pain? Yes: NPRS scale: 4-5/10  Pain location: B knees Pain description: achy/ persistent Aggravating factors: walking/ step ups Relieving factors: rest  PRECAUTIONS: Fall  RED FLAGS: None   WEIGHT BEARING RESTRICTIONS: No  FALLS:  Has patient fallen in last 6 months? No  LIVING ENVIRONMENT: Lives with: lives with their spouse Lives in: Mobile home Stairs: Yes: External: 5 steps; can reach both Has following equipment at home: Single point cane  OCCUPATION: Retired  PLOF: Independent with household mobility with device  PATIENT GOALS: Increase R sided muscle strength/ walking endurance/ balance.    NEXT MD VISIT: PRN  OBJECTIVE:  Note: Objective measures were completed at Evaluation unless otherwise noted.  PATIENT SURVEYS:  LEFS: 18 out of 80.    COGNITION: Overall cognitive status: Within functional limits for tasks assessed     SENSATION: Light touch: Impaired   EDEMA:  Moderate B lower leg swelling.  Pt. Has compression stockings/ lymphedema pumps.  Marked improvement in lower leg swelling as compared to last PT tx. Session.     MUSCLE LENGTH: Hamstrings: NT Thomas test: Unable to test due to limited supine positioning  POSTURE: rounded shoulders, forward head, and flexed trunk   PALPATION: (+)  B knee joint line and lower leg tenderness.  Pitting edema noted in B lower legs (slightly improved from last PT tx. Session several months ago).    LOWER EXTREMITY ROM:  Active ROM Right eval Left eval  Hip flexion <90 deg. <90 deg.  Hip extension    Hip abduction    Hip adduction    Hip internal rotation    Hip external rotation    Knee flexion    Knee extension    Ankle dorsiflexion    Ankle plantarflexion    Ankle inversion    Ankle eversion     (Blank rows = not tested)  LOWER EXTREMITY MMT:  MMT Right eval Left eval  Hip flexion 4- 4  Hip extension    Hip abduction 4 4+  Hip adduction    Hip internal rotation 4+ 4+  Hip external rotation 4 4  Knee flexion 5 5  Knee extension 5 5  Ankle dorsiflexion    Ankle plantarflexion  Ankle inversion    Ankle eversion     (Blank rows = not tested)  LOWER EXTREMITY SPECIAL TESTS:  NT  FUNCTIONAL TESTS:  5 times sit to stand: 23.44 sec. (1st attempt), 12.67 sec. (2nd attempt).   Berg Balance Scale: TBD  GAIT: Distance walked: in clinic/ outside on sidewalk Assistive device utilized: Single point cane Level of assistance: Modified independence Comments: slow gait pattern with decreased arm swing, heel strike, reciprocal stride length. R foot ER.  No LOB during tx. Session.  Several seated rest breaks (vital WNL)  LEFS: 14/80  Berg: 42/56 5xSTS: 16.03 sec   Berg: 41/56 LEFS: 14/80  MMT Right eval Left eval  Hip flexion 4+ 4+  Hip extension    Hip abduction 4+ 4+  Hip adduction 5 5  Hip internal rotation 4 4+  Hip external rotation 4 4+  Knee flexion 5  5   Knee extension 5* (with hand placement due to swelling) 5 * (with hand placement due to swelling)  Ankle dorsiflexion    Ankle plantarflexion    Ankle inversion    Ankle eversion     (Blank rows = not tested)                                                     MMT Right eval Left eval  Hip flexion 4+ 4  Hip extension    Hip abduction 5 5   Hip adduction    Hip internal rotation 4+ 4+  Hip external rotation 4 4+  Knee flexion 5 5  Knee extension 5 5  Ankle dorsiflexion    Ankle plantarflexion    Ankle inversion    Ankle eversion     (Blank rows = not tested) 08/14/24 5xSTS: 12.3 seconds  TREATMENT DATE: 10/02/2024  Subjective:  Pt. reports moderate 4/10 NPS for the left knee, 5/10 NPS on the right knee at start of today's session. Pt states [place], he used his RW for ambulation and did not have any falls.   There.act.:    Walking in hallway with use of RW and added 6 and 12' hurdles/ Airex pad ~ 40 ft  Forward marches in hallway with use of RW, ~ 40 ft  Taps to 6-inch step with alternating bilateral LEs, 2 x 20 reps (standing rest break in between, without use of UE support)  Reassessed gait pattern  There.ex.: Nu-Step B UE/LE level 3-4 resistance, 10 minutes (seat 10)- discussed pain symptoms/ importance of daily walking/ activity/ HEP  Standing Nautilus lat. Pull downs with bar (50#): 24x  Seated LAQs 3#, and 3 x 10 reps   Seated ankle pumps, 20 reps  Prior to ambulation: spO2: 96% HR: 64 bpm Post ambulation: spO2: 94% HR: 69 bpm  PATIENT EDUCATION:  Education details: Discussed walking program/ home ex. Person educated: Patient Education method: Medical Illustrator Education comprehension: verbalized understanding and returned demonstration  HOME EXERCISE PROGRAM: Standing 3-way hip ex./ walking program  ASSESSMENT:  CLINICAL IMPRESSION: Pt ambulated with RW with hallway ambulation activities and CGA from therapist with no LOB noted. Pt with preference of stepping over 6 and 12 inch hurdles with LLE and noted to circumduct right hip around hurdle due to current deficits with right hip flexion strength/endurance. Pt continues to require long duration seated  rest breaks after each activity to recover from SOB and general, systemic fatigue. Vitals prior to  ambulation: spO2: 96% HR: 64 bpm and recorded post ambulation: spO2: 94% HR: 69 bpm. Pt reported no increase in bilateral knee pain at end of tx session. Will continue to monitor and progress as able.   OBJECTIVE IMPAIRMENTS: Abnormal gait, cardiopulmonary status limiting activity, decreased activity tolerance, decreased balance, decreased coordination, decreased endurance, decreased mobility, difficulty walking, decreased ROM, decreased strength, hypomobility, impaired flexibility, impaired sensation, impaired UE functional use, improper body mechanics, postural dysfunction, obesity, and pain.   ACTIVITY LIMITATIONS: carrying, lifting, bending, standing, squatting, stairs, transfers, bed mobility, bathing, toileting, dressing, hygiene/grooming, and locomotion level  PARTICIPATION LIMITATIONS: meal prep, cleaning, driving, shopping, and community activity  PERSONAL FACTORS: Past/current experiences are also affecting patient's functional outcome.   REHAB POTENTIAL: Good  CLINICAL DECISION MAKING: Evolving/moderate complexity  EVALUATION COMPLEXITY: Moderate   GOALS: Goals reviewed with patient? Yes  SHORT TERM GOALS: Target date: 09/04/24 Pt. Will increase B hip flexion to >90 deg. In supine position to improve strength/ step length with gait.   Baseline:  see above.  7/15: to be assessed next visit, pt unable to attain supine position  Goal status: Partially met  2.  Pt. Will complete 5xSTS in <10 sec. To improve mobility/ decrease fall risk.   Baseline: 5 times sit to stand: 23.44 sec. (1st attempt), 12.67 sec. (2nd attempt). 7/15: 16.03 sec. 9/10: 12.3 sec. Goal status: Partially met   LONG TERM GOALS: Target date: 10/24/24  Pt. Independent with HEP to increase B LE muscle strength 1/2 muscle grade to improve mobility/ decrease fall risk.   Baseline: see above. 7/15: strength maintained or improved 8:28: gross LE strength maintained Goal status: Partially met  2.  Pt. Will  complete Berg balance test and score >48/56 to decrease fall risk/ improve walking with least assistive device.   Baseline:  43/56,  7/15: 42/56. 8/28: 40/56 Goal status: Not met  3.  Pt. Will increase LEFS to >30 out of 80 to improve pain-free functional mobility.   Baseline: 18 out of 80. 7/15: 14/80. 8/28: 14/80 Goal status: Not met   4.  Pt. Able to ambulate 15 minutes with use of SPC and consistent step pattern on outside surfaces.   Baseline:  increase B knee pain/ limited endurance. 7/15: not able to test due to weather  Goal status: Not met  PLAN:  PT FREQUENCY: 1-2x/week  PT DURATION: 12 weeks  PLANNED INTERVENTIONS: 97750- Physical Performance Testing, 97110-Therapeutic exercises, 97530- Therapeutic activity, 97112- Neuromuscular re-education, 97535- Self Care, [id number]- Manual therapy, [phone]- Gait training, [phone]- Electrical stimulation (unattended), Patient/Family education, Balance training, Stair training, Joint mobilization, Spinal mobilization, Cryotherapy, and Moist heat  PLAN FOR NEXT SESSION:  Improve functional endurance with dynamic ambulatory tasks on even and uneven surfaces with and without environmental obstacles.     Curtistine Bracket, SPT Ozell JAYSON Sero, PT, DPT # [phone] Physical Therapist - [facility] 10/02/2024, 2:40 PM

## 2024-10-03 ENCOUNTER — Encounter: Payer: Self-pay | Admitting: Physical Therapy

## 2024-10-09 ENCOUNTER — Encounter: Payer: Self-pay | Admitting: Professional

## 2024-10-09 ENCOUNTER — Ambulatory Visit (INDEPENDENT_AMBULATORY_CARE_PROVIDER_SITE_OTHER): Payer: PRIVATE HEALTH INSURANCE | Admitting: Professional

## 2024-10-09 ENCOUNTER — Ambulatory Visit: Attending: Family Medicine | Admitting: Physical Therapy

## 2024-10-09 DIAGNOSIS — R262 Difficulty in walking, not elsewhere classified: Secondary | ICD-10-CM | POA: Diagnosis not present

## 2024-10-09 DIAGNOSIS — R2681 Unsteadiness on feet: Secondary | ICD-10-CM | POA: Insufficient documentation

## 2024-10-09 DIAGNOSIS — M25662 Stiffness of left knee, not elsewhere classified: Secondary | ICD-10-CM | POA: Diagnosis not present

## 2024-10-09 DIAGNOSIS — M25661 Stiffness of right knee, not elsewhere classified: Secondary | ICD-10-CM | POA: Diagnosis not present

## 2024-10-09 DIAGNOSIS — R269 Unspecified abnormalities of gait and mobility: Secondary | ICD-10-CM | POA: Diagnosis not present

## 2024-10-09 DIAGNOSIS — M6281 Muscle weakness (generalized): Secondary | ICD-10-CM | POA: Diagnosis not present

## 2024-10-09 DIAGNOSIS — R279 Unspecified lack of coordination: Secondary | ICD-10-CM | POA: Insufficient documentation

## 2024-10-09 DIAGNOSIS — F411 Generalized anxiety disorder: Secondary | ICD-10-CM | POA: Diagnosis not present

## 2024-10-09 DIAGNOSIS — F320 Major depressive disorder, single episode, mild: Secondary | ICD-10-CM | POA: Diagnosis not present

## 2024-10-09 NOTE — Therapy (Unsigned)
 OUTPATIENT PHYSICAL THERAPY LOWER EXTREMITY TREATMENT  Patient Name: Tom Underwood MRN: [id number] DOB:05-14-1951, 73 y.o., male Today's Date: 10/09/2024  END OF SESSION:  PT End of Session - 10/09/24 1349     Visit Number 25    Number of Visits 30    Date for Recertification  10/24/24    PT Start Time 1341    PT Stop Time 1436    PT Time Calculation (min) 55 min    Equipment Utilized During Treatment Gait belt    Activity Tolerance Patient tolerated treatment well;Patient limited by pain;Patient limited by fatigue    Behavior During Therapy [facility] for tasks assessed/performed          Past Medical History:  Diagnosis Date   Anxiety    Arthritis    Atrial fibrillation (HCC)    a.) CHA2DS2-VASc = 4 (age, HTN, CVA x2). b.) s/p 200 J synchronized cardioversion (DCCV) on 12/08/2021. c.) rate/rythum maintained on oral metoprolol  tartrate; chronically anticoagulated using rivaroxaban .   BPH (benign prostatic hyperplasia)    DDD (degenerative disc disease), lumbar    Decreased libido    Depression    Diverticulitis    Diverticulosis    GERD (gastroesophageal reflux disease)    Hepatic steatosis    History of 2019 novel coronavirus disease (COVID-19) 12/28/2020   History of cardiac murmur in childhood    History of kidney stones    HLD (hyperlipidemia)    Hypertension    Hypogonadism in male    OSA on CPAP    Pre-diabetes    Rhinitis, allergic    Sleep apnea    Stroke (HCC) 07/23/2019   a.) LEFT posterior cor radiata. b.) residual RIGHT sided weakness   Past Surgical History:  Procedure Laterality Date   CARDIOVERSION N/A 12/08/2021   Procedure: CARDIOVERSION;  Surgeon: Mady Bruckner, MD;  Location: [facility] ORS;  Service: Cardiovascular;  Laterality: N/A;   COLONOSCOPY     COLONOSCOPY WITH PROPOFOL  N/A 10/06/2023   Procedure: COLONOSCOPY WITH PROPOFOL ;  Surgeon: Therisa Bi, MD;  Location: [facility] ENDOSCOPY;  Service: Gastroenterology;  Laterality: N/A;   EVALUATION  UNDER ANESTHESIA WITH HEMORRHOIDECTOMY N/A 04/15/2022   Procedure: EXAM UNDER ANESTHESIA WITH HEMORRHOIDECTOMY;  Surgeon: Tye Millet, DO;  Location: [facility] ORS;  Service: General;  Laterality: N/A;   EXTERNAL EAR SURGERY     EYE SURGERY     FINGER SURGERY Right    lateration to 5 th digit   KNEE SURGERY     left eye     POLYPECTOMY  10/06/2023   Procedure: POLYPECTOMY;  Surgeon: Therisa Bi, MD;  Location: [facility] ENDOSCOPY;  Service: Gastroenterology;;   RECTAL EXAM UNDER ANESTHESIA N/A 04/23/2022   Procedure: RECTAL EXAM UNDER ANESTHESIA WITH LIGATION OF BLEEDING;  Surgeon: Desiderio Schanz, MD;  Location: [facility] ORS;  Service: General;  Laterality: N/A;   Patient Active Problem List   Diagnosis Date Noted   Chronic venous insufficiency of lower extremity 05/23/2024   History of atrial fibrillation 11/22/2023   Major depression, recurrent, chronic 11/22/2023   Adenomatous polyp of colon 10/06/2023   Gastroesophageal reflux disease without esophagitis 05/17/2022   History of syncope 05/17/2022   History of hemorrhoidectomy 05/17/2022   Vitamin D  deficiency 05/17/2022   Lower extremity edema 05/17/2022   History of rectal bleeding 05/17/2022   Morbid obesity (HCC)    Hypertension    Hemiparesis of right dominant side as late effect of cerebral infarction (HCC) 08/13/2019   History of kidney stones 05/02/2018   ED (  erectile dysfunction) 05/02/2018   Hyperglycemia 01/30/2017   Arthritis of knee, degenerative 01/30/2017   BPH with obstruction/lower urinary tract symptoms 02/03/2016   Dyslipidemia 08/04/2015   OSA on CPAP 06/30/2015   PCP: Glenard Mire, MD  REFERRING PROVIDER: Sowles, Krichna, MD  REFERRING DIAG:  [id number] (ICD-10-CM) - Hemiparesis of right dominant side as late effect of cerebral infarction (HCC)  R26.81 (ICD-10-CM) - Gait instability    THERAPY DIAG:  Muscle weakness (generalized)  Unsteadiness on feet  Abnormality of gait and mobility  Joint stiffness of  both knees  Difficulty in walking, not elsewhere classified  Rationale for Evaluation and Treatment: Rehabilitation  ONSET DATE: chronic  SUBJECTIVE:   SUBJECTIVE STATEMENT: Pt. Reports <5/10 B knee pain walking around PT clinic.  Pt. States he has been walking but limited by knee pain/ endurance.  Pt. Referred back to PT by MD since discharge from PT a few months ago due to regression in LE strength/ endurance.  Pt. Had a R sided stroke on 08/23/2019 and lives with wife and uses [facility] for safety with walking.  Pt. Fearful with walking on varying terrain/ curbs.  No recent falls reported.    PERTINENT HISTORY: Pt. Known well to PT clinic.    PAIN:  Are you having pain? Yes: NPRS scale: 4-5/10  Pain location: B knees Pain description: achy/ persistent Aggravating factors: walking/ step ups Relieving factors: rest  PRECAUTIONS: Fall  RED FLAGS: None   WEIGHT BEARING RESTRICTIONS: No  FALLS:  Has patient fallen in last 6 months? No  LIVING ENVIRONMENT: Lives with: lives with their spouse Lives in: Mobile home Stairs: Yes: External: 5 steps; can reach both Has following equipment at home: Single point cane  OCCUPATION: Retired  PLOF: Independent with household mobility with device  PATIENT GOALS: Increase R sided muscle strength/ walking endurance/ balance.    NEXT MD VISIT: PRN  OBJECTIVE:  Note: Objective measures were completed at Evaluation unless otherwise noted.  PATIENT SURVEYS:  LEFS: 18 out of 80.    COGNITION: Overall cognitive status: Within functional limits for tasks assessed     SENSATION: Light touch: Impaired   EDEMA:  Moderate B lower leg swelling.  Pt. Has compression stockings/ lymphedema pumps.  Marked improvement in lower leg swelling as compared to last PT tx. Session.     MUSCLE LENGTH: Hamstrings: NT Thomas test: Unable to test due to limited supine positioning  POSTURE: rounded shoulders, forward head, and flexed trunk    PALPATION: (+) B knee joint line and lower leg tenderness.  Pitting edema noted in B lower legs (slightly improved from last PT tx. Session several months ago).    LOWER EXTREMITY ROM:  Active ROM Right eval Left eval  Hip flexion <90 deg. <90 deg.  Hip extension    Hip abduction    Hip adduction    Hip internal rotation    Hip external rotation    Knee flexion    Knee extension    Ankle dorsiflexion    Ankle plantarflexion    Ankle inversion    Ankle eversion     (Blank rows = not tested)  LOWER EXTREMITY MMT:  MMT Right eval Left eval  Hip flexion 4- 4  Hip extension    Hip abduction 4 4+  Hip adduction    Hip internal rotation 4+ 4+  Hip external rotation 4 4  Knee flexion 5 5  Knee extension 5 5  Ankle dorsiflexion    Ankle plantarflexion  Ankle inversion    Ankle eversion     (Blank rows = not tested)  LOWER EXTREMITY SPECIAL TESTS:  NT  FUNCTIONAL TESTS:  5 times sit to stand: 23.44 sec. (1st attempt), 12.67 sec. (2nd attempt).   Berg Balance Scale: TBD  GAIT: Distance walked: in clinic/ outside on sidewalk Assistive device utilized: Single point cane Level of assistance: Modified independence Comments: slow gait pattern with decreased arm swing, heel strike, reciprocal stride length. R foot ER.  No LOB during tx. Session.  Several seated rest breaks (vital WNL)  LEFS: 14/80  Berg: 42/56 5xSTS: 16.03 sec   Berg: 41/56 LEFS: 14/80  MMT Right eval Left eval  Hip flexion 4+ 4+  Hip extension    Hip abduction 4+ 4+  Hip adduction 5 5  Hip internal rotation 4 4+  Hip external rotation 4 4+  Knee flexion 5  5   Knee extension 5* (with hand placement due to swelling) 5 * (with hand placement due to swelling)  Ankle dorsiflexion    Ankle plantarflexion    Ankle inversion    Ankle eversion     (Blank rows = not tested)                                                     MMT Right eval Left eval  Hip flexion 4+ 4  Hip extension     Hip abduction 5 5  Hip adduction    Hip internal rotation 4+ 4+  Hip external rotation 4 4+  Knee flexion 5 5  Knee extension 5 5  Ankle dorsiflexion    Ankle plantarflexion    Ankle inversion    Ankle eversion     (Blank rows = not tested) 08/14/24 5xSTS: 12.3 seconds  TREATMENT DATE: 10/09/2024  Subjective:  Pt. reports moderate 6/10 NPS for the left knee, 4/10 NPS on the right knee at start of today's session. Pt reports no new falls since last session.   There.act.:    Walking in hallway with use of RW and added 6 hurdles, cone taps on firm surface, and Airex pad ~ 50 ft  Forward marches in hallway with use of RW, ~ 50 ft  Taps to 6-inch step with alternating bilateral LEs, 2 x 20 reps (standing rest break in between, without use of UE support)  Forward marching in // bars, 2 x down and back  Lateral walking in // bars, 2 x down and back   There.ex.: Nu-Step B UE/LE level 4-5 resistance, 10 minutes (seat 10)- discussed pain symptoms/ importance of daily walking/ activity/ HEP  Standing Nautilus lat. Pull downs with bar (50#): 25 x total  Seated LAQs 5#, and 3 x 10 reps  Seated ankle pumps, 20 reps  PATIENT EDUCATION:  Education details: Discussed walking program/ home ex. Person educated: Patient Education method: Medical Illustrator Education comprehension: verbalized understanding and returned demonstration  HOME EXERCISE PROGRAM: Standing 3-way hip ex./ walking program  ASSESSMENT:  CLINICAL IMPRESSION: Pt ambulated with RW with hallway ambulation activities and CGA from therapist with no LOB noted. Pt completed forward walking in hallway with anterior cone taps while standing on firm surface which he was able to do and remain standing in SLS on BLE's for ~3 seconds each. Pt was progressed to completing LAQs in seated with 5# AWs  which he tolerated well. Pt continues to require long duration seated rest breaks after each activity to recover from  SOB and general, systemic fatigue. Pt reported no increase in bilateral knee pain at end of tx session. Will continue to monitor and progress as able.   OBJECTIVE IMPAIRMENTS: Abnormal gait, cardiopulmonary status limiting activity, decreased activity tolerance, decreased balance, decreased coordination, decreased endurance, decreased mobility, difficulty walking, decreased ROM, decreased strength, hypomobility, impaired flexibility, impaired sensation, impaired UE functional use, improper body mechanics, postural dysfunction, obesity, and pain.   ACTIVITY LIMITATIONS: carrying, lifting, bending, standing, squatting, stairs, transfers, bed mobility, bathing, toileting, dressing, hygiene/grooming, and locomotion level  PARTICIPATION LIMITATIONS: meal prep, cleaning, driving, shopping, and community activity  PERSONAL FACTORS: Past/current experiences are also affecting patient's functional outcome.   REHAB POTENTIAL: Good  CLINICAL DECISION MAKING: Evolving/moderate complexity  EVALUATION COMPLEXITY: Moderate   GOALS: Goals reviewed with patient? Yes  SHORT TERM GOALS: Target date: 09/04/24 Pt. Will increase B hip flexion to >90 deg. In supine position to improve strength/ step length with gait.   Baseline:  see above.  7/15: to be assessed next visit, pt unable to attain supine position  Goal status: Partially met  2.  Pt. Will complete 5xSTS in <10 sec. To improve mobility/ decrease fall risk.   Baseline: 5 times sit to stand: 23.44 sec. (1st attempt), 12.67 sec. (2nd attempt). 7/15: 16.03 sec. 9/10: 12.3 sec. Goal status: Partially met   LONG TERM GOALS: Target date: 10/24/24  Pt. Independent with HEP to increase B LE muscle strength 1/2 muscle grade to improve mobility/ decrease fall risk.   Baseline: see above. 7/15: strength maintained or improved 8:28: gross LE strength maintained Goal status: Partially met  2.  Pt. Will complete Berg balance test and score >48/56 to  decrease fall risk/ improve walking with least assistive device.   Baseline:  43/56,  7/15: 42/56. 8/28: 40/56 Goal status: Not met  3.  Pt. Will increase LEFS to >30 out of 80 to improve pain-free functional mobility.   Baseline: 18 out of 80. 7/15: 14/80. 8/28: 14/80 Goal status: Not met   4.  Pt. Able to ambulate 15 minutes with use of SPC and consistent step pattern on outside surfaces.   Baseline:  increase B knee pain/ limited endurance. 7/15: not able to test due to weather  Goal status: Not met  PLAN:  PT FREQUENCY: 1-2x/week  PT DURATION: 12 weeks  PLANNED INTERVENTIONS: 97750- Physical Performance Testing, 97110-Therapeutic exercises, 97530- Therapeutic activity, 97112- Neuromuscular re-education, 97535- Self Care, [id number]- Manual therapy, [phone]- Gait training, [phone]- Electrical stimulation (unattended), Patient/Family education, Balance training, Stair training, Joint mobilization, Spinal mobilization, Cryotherapy, and Moist heat  PLAN FOR NEXT SESSION:  Improve functional endurance with dynamic ambulatory tasks on even and uneven surfaces with and without environmental obstacles.    Curtistine Bracket, SPT Ozell JAYSON Sero, PT, DPT # [phone] Physical Therapist - [facility] 10/09/2024, 3:26 PM

## 2024-10-09 NOTE — Progress Notes (Signed)
 [facility] Counselor/Therapist Progress Note  Patient ID: Tom Underwood, MRN: [id number],    Date: 10/09/2024  Time Spent: 54 minutes 1005-1059am   Treatment Type: Individual Therapy  Risk Assessment: Danger to Self:  No Self-injurious Behavior: No Danger to Others: No Duty to Warn:no  Subjective: This session was held via audio teletherapy. The patient consented to audio teletherapy and was located at his home during this session. He is aware it is the responsibility of the patient to secure confidentiality on her end of the session. The provider was in a private home office for the duration of this session.    The patient arrived on time for his Caregility appointment.   Issues addressed: 1-attempted to ERP appointment, however, practitioner does not accept Medicare -he is unsure he wants ERP 2[place] -pt commended for facing his fear 3-how to focus on what he can control 4-being intentional to interrupt the catastrophic thought  Treatment Plan Problems: Anxiety, Unipolar Depression, Posttraumatic Stress Disorder (PTSD), Grief / Loss Unresolved,  Symptoms: Excessive and/or unrealistic worry that is difficult to control occurring more days than not for at least 6 months about a number of events or activities. Motor tension (e.g., restlessness, tiredness, shakiness, muscle tension). Hypervigilance (e.g., feeling constantly on edge, experiencing concentration difficulties, having trouble falling or staying asleep, exhibiting a general state of irritability). Thoughts dominated by loss coupled with poor concentration, tearful spells, and confusion about the future. Serial losses in life (i.e., deaths, divorces, jobs) that led to depression and discouragement. Strong emotional response of sadness exhibited when losses are discussed. Lack of appetite, weight loss, and/or insomnia as well as  other depression signs that occurred since the loss. Has been exposed to a traumatic event involving actual or perceived threat of death or serious injury. Reports response of intense fear, helplessness, or horror to the traumatic event. Experiences disturbing and persistent thoughts, images, and/or perceptions of the traumatic event. Displays significant psychological and/or physiological distress resulting from internal and external clues that are reminiscent of the traumatic event. Intentionally avoids thoughts, feelings, or discussions related to the traumatic event. Intentionally avoids activities, places, people, or objects (e.g., up-armored vehicles) that evoke memories of the event. Displays a significant decline in interest and engagement in activities. Experiences disturbances in sleep. Reports difficulty concentrating as well as feelings of guilt. Reports hypervigilance. Symptoms present more than one month. Impairment in social, occupational, or other areas of functioning. Depressed or irritable mood. Decrease or loss of appetite. Diminished interest in or enjoyment of activities. Sleeplessness or hypersomnia. Lack of energy. Poor concentration and indecisiveness. Social withdrawal. Feelings of hopelessness, worthlessness, or inappropriate guilt. Unresolved grief issues. History of chronic or recurrent depression for which the client has taken antidepressant medication, been hospitalized, had outpatient treatment, or had a course of electroconvulsive therapy. Goals: Learn and implement coping skills that result in a reduction of anxiety and worry, and improved daily functioning. Enhance ability to effectively cope with the full variety of life's worries and anxieties. Stabilize anxiety level while increasing ability to function on a daily basis. Begin a healthy grieving process around the loss. Develop an awareness of how the avoidance of grieving has affected life and begin  the healing process. Resolve the loss, reengaging in old relationships and initiating new contacts with others. Eliminate or reduce the negative impact trauma related symptoms have on social, occupational, and family functioning. Thinks about or openly  discusses the traumatic event with others without experiencing psychological or physiological distress. No longer avoids persons, places, activities, and objects that are reminiscent of the traumatic event. Alleviate depressive symptoms and return to previous level of effective functioning. Recognize, accept, and cope with feelings of depression. Develop healthy thinking patterns and beliefs about self, others, and the world that lead to the alleviation and help prevent the relapse of depression. Appropriately grieve the loss in order to normalize mood and to return to previously adaptive level of functioning. Objectives target date for all objectives is 11/19/2024 Describe situations, thoughts, feelings, and actions associated with anxieties and worries, their impact on functioning, and attempts to resolve them.   50% Verbalize an understanding of the cognitive, physiological, and behavioral components of anxiety and its treatment.   40% Learn and implement calming skills to reduce overall anxiety and manage anxiety symptoms.  30% Learn and implement a strategy to limit the association between various environmental settings and worry, delaying the worry until a designated worry time.   20% Learn and implement problem-solving strategies for realistically addressing worries.   40% Identify, challenge, and replace biased, fearful self-talk with positive, realistic, and empowering self-talk.   65% Verbalize an understanding of the role that cognitive biases play in excessive irrational worry and persistent anxiety symptoms.   65% Identify and engage in pleasant activities on a daily basis.  65% Learn and implement relapse prevention strategies for  managing possible future anxiety symptoms.   40% Learn to accept limitations in life and commit to tolerating, rather than avoiding, unpleasant emotions while accomplishing meaningful goals.  40% Tell in detail the story of the current loss that is triggering symptoms.   100% Identify what stages of grief have been experienced in the continuum of the grieving process.   40% (has not grieved mother's death) Begin verbalizing feelings associated with the loss.   20% Verbalize resolution of feelings of guilt and regret associated with the loss. 30% Identify and voice positives about the deceased loved one including previous positive experiences, positive characteristics, positive aspects of the relationship, and how these things may be remembered.   10% Learn and implement calming skills.   50% Learn and implement guided self-dialogue to manage thoughts, feelings, and urges brought on by encounters with trauma-related situations.   30% Acknowledge the need to implement anger control techniques; learn and implement anger management techniques.   30% Implement a regular exercise regimen as a stress release technique.   50% Describe current and past experiences with depression including their impact on functioning and attempts to resolve it.   70% Verbalize an accurate understanding of depression.   70% Identify and replace thoughts and beliefs that support depression.   40% Learn and implement behavioral strategies to overcome depression.   60% Identify important people in life, past and present, and describe the quality, good and poor, of those relationships.   30% Learn and implement problem-solving and decision-making skills.   30% Learn and implement conflict resolution skills to resolve interpersonal problems.   30% Interventions: Use empathy, compassion, and support, allowing the client to tell in detail the story of his/her recent loss. Assign the client to make a list of all the regrets  associated with actions toward or relationship with the deceased; process the list content toward resolution of these feelings. Assist the client in engaging in behaviors that celebrate the positive memorable aspects of the loved one and his/her life (e.g., placing memoriam in newspaper on anniversary of  death, volunteering time to a favorite cause of the deceased person). Ask the client to discuss and/or list the positive aspects of and memories about his/her relationship with the lost loved one; reinforce the client's expression of positive memories and emotions (e.g., smiling, laughing); encourage the client to share these thoughts with supportive loved ones. Educate the client on the stages of the grieving process and answer any questions he/she may have. Assist the client in identifying the stages of grief that he/she has experienced and which stage he/she is presently working through. Ask the client to bring pictures or mementos connected with his/her loss to a session and talk about them (or assign Creating a Patent Examiner in the Adult Psychotherapy Administrator, Arts by Jenniffer). Assist the client in identifying and expressing feelings connected with his/her loss. Gently and sensitively explore the client's recollection of the facts of the traumatic incident and his/her cognitive and emotional reactions at the time; assess frequency, intensity, duration, and history of the client's PTSD symptoms and their impact on functioning (see How the Trauma Affects Me in the Adult Psychotherapy Homework Planner by Jongsma); supplement with semi-structured assessment instrument if desired (see The Anxiety Disorders Interview Schedule - Adult Version). Using Cognitive Therapy techniques, explore the client's self-talk and beliefs about self, others, and the future that are a consequence of the trauma (e.g., themes of safety, trust, power, control, esteem, and intimacy); identify and challenge biases; assist  him/her in generating appraisals that correct for the biases; test biased and alternatives predictions through behavioral experiments. Teach the client a guided self-dialogue procedure in which he/she learns to recognize maladaptive self-talk, challenges its biases, copes with engendered feelings, overcomes avoidance, and reinforces his/her accomplishments; review and reinforce progress, problem-solve obstacles. Utilize Eye Movement Desensitization and Reprocessing (EMDR) to reduce the client's emotional reactivity to the traumatic event and reduce PTSD symptoms. Assess the client for instances of poor anger management that have led to threats or actual violence that caused damage to property and/or injury to people (or assign Anger Journal in the Adult Psychotherapy Homework Planner by Jenniffer). Teach the client anger management techniques (see the Anger Control Problems chapter in this Planner). Develop and encourage a routine of physical exercise for the client. Teach the client calming skills (e.g., breathing retraining, relaxation, calming self-talk) to use in and between sessions when feeling overly distressed. Ask the client to describe his/her past experiences of anxiety and their impact on functioning; assess the focus, excessiveness, and uncontrollability of the worry and the type, frequency, intensity, and duration of his/her anxiety symptoms (consider using a structured interview such as The Anxiety Disorders Interview Schedule-Adult Version). Explore the client's schema and self-talk that mediate his/her fear response; assist him/her in challenging the biases; replace the distorted messages with reality-based alternatives and positive, realistic self-talk that will increase his/her self-confidence in coping with irrational fears (see Cognitive Therapy of Anxiety Disorders by Gretta armin Mon). Teach the client problem-solving strategies involving specifically defining a problem, generating  options for addressing it, evaluating the pros and cons of each option, selecting and implementing an optional action, and reevaluating and refining the action (or assign Applying Problem-Solving to Interpersonal Conflict in the Adult Psychotherapy Homework Planner by Jenniffer). Engage the client in behavioral activation, increasing the client's contact with sources of reward, identifying processes that inhibit activation, and teaching skills to solve life problems (or assign Identify and Schedule Pleasant Activities in the Adult Psychotherapy Homework Planner by Jenniffer); use behavioral techniques such as instruction, rehearsal, role-playing, role  reversal as needed to assist adoption into the client's daily life; reinforce success. Discuss with the client the distinction between a lapse and relapse, associating a lapse with an initial and reversible return of worry, anxiety symptoms, or urges to avoid, and relapse with the decision to continue the fearful and avoidant patterns. Identify and rehearse with the client the management of future situations or circumstances in which lapses could occur. Instruct the client to routinely use new therapeutic skills (e.g., relaxation, cognitive restructuring, exposure, and problem-solving) in daily life to address emergent worries, anxiety, and avoidant tendencies. Develop a coping card on which coping strategies and other important information (e.g., Breathe deeply and relax, Challenge unrealistic worries, Use problem-solving) are written for the client's later use. Use techniques from Acceptance and Commitment Therapy to help client accept uncomfortable realities such as lack of complete control, imperfections, and uncertainty and tolerate unpleasant emotions and thoughts in order to accomplish value-consistent goals. Discuss how generalized anxiety typically involves excessive worry about unrealistic threats, various bodily expressions of tension,  overarousal, and hypervigilance, and avoidance of what is threatening that interact to maintain the problem (see Mastery of Your Anxiety and Worry: Therapist Guide by Venson River, and Barlow; Treating Generalized Anxiety Disorder by Rygh and Red). Teach the client calming/relaxation skills (e.g., applied relaxation, progressive muscle relaxation, cue controlled relaxation; mindful breathing; biofeedback) and how to discriminate better between relaxation and tension; teach the client how to apply these skills to his/her daily life (e.g., New Directions in Progressive Muscle Relaxation by Thornell Collier, and Hazlett-Stevens; Treating Generalized Anxiety Disorder by Rygh and Red). Assign the client to read about progressive muscle relaxation and other calming strategies in relevant books or treatment manuals (e.g., Progressive Relaxation Training by Thornell and Collier; Mastery of Your Anxiety and Worry: Workbook by River armin Given). Explain the rationale for using a worry time as well as how it is to be used; agree upon and implement a worry time with the client. Teach the client how to recognize, stop, and postpone worry to the agreed upon worry time using skills such as thought stopping, relaxation, and redirecting attention (or assign Making Use of the Thought-Stopping Technique and/or Worry Time in the Adult Psychotherapy Homework Planner by Jongsma to assist skill development); encourage use in daily life; review and reinforce success while providing corrective feedback toward improvement. Assist the client in analyzing his/her worries by examining potential biases such as the probability of the negative expectation occurring, the real consequences of it occurring, his/her ability to control the outcome, the worst possible outcome, and his/her ability to accept it (see Analyze the Probability of a Feared Event in the Adult Psychotherapy Homework Planner by Jenniffer; Cognitive  Therapy of Anxiety Disorders by Gretta armin Mon). Encourage the client to share his/her thoughts and feelings of depression; express empathy and build rapport while identifying primary cognitive, behavioral, interpersonal, or other contributors to depression. Conduct Cognitive-Behavioral Therapy (see Cognitive Behavior Therapy by Mon; Overcoming Depression by Marine dunker al.), beginning with helping the client learn the connection among cognition, depressive feelings, and actions. Assign the client to self-monitor thoughts, feelings, and actions in daily journal (e.g., Negative Thoughts Trigger Negative Feelings in the Adult Psychotherapy Homework Planner by Jenniffer; Daily Record of Dysfunctional Thoughts in Cognitive Therapy of Depression by Mon Candida Gentry and Shona); process the journal material to challenge depressive thinking patterns and replace them with reality-based thoughts. Facilitate and reinforce the client's shift from biased depressive self-talk and beliefs to reality-based cognitive messages that  enhance self-confidence and increase adaptive actions (see Positive Self-Talk in the Adult Psychotherapy Homework Planner by Jenniffer). Assist the client in developing skills that increase the likelihood of deriving pleasure from behavioral activation (e.g., assertiveness skills, developing an exercise plan, less internal/more external focus, increased social involvement); reinforce success. Conduct Interpersonal Therapy (see Interpersonal Psychotherapy of Depression by Anne dunker al.), beginning with the assessment of the client's interpersonal inventory of important past and present relationships; develop a case formulation linking depression to grief, interpersonal role disputes, role transitions, and/or interpersonal deficits). Conduct Problem-Solving Therapy (see Problem-Solving Therapy by Francisco and Nezu) using techniques such as psychoeducation, modeling, and role-playing to teach  client problem-solving skills (i.e., defining a problem specifically, generating possible solutions, evaluating the pros and cons of each solution, selecting and implementing a plan of action, evaluating the efficacy of the plan, accepting or revising the plan); role-play application of the problem-solving skill to a real life issue (or assign Applying Problem-Solving to Interpersonal Conflict in the Adult Psychotherapy Homework Planner by Jenniffer). Teach conflict resolution skills (e.g., empathy, active listening, I messages, respectful communication, assertiveness without aggression, compromise); use psychoeducation, modeling, role-playing, and rehearsal to work through several current conflicts; assign homework exercises; review and repeat so as to integrate their use into the client's life. Consistent with the treatment model, discuss how cognitive, behavioral, interpersonal, and/or other factors (e.g., family history) contribute to depression.  Diagnosis:Generalized anxiety disorder  Current mild episode of major depressive disorder without prior episode  Plan:  -meet again Wednesday, October 23, 2024 at 10am virtually.

## 2024-10-12 ENCOUNTER — Other Ambulatory Visit: Payer: Self-pay | Admitting: Family Medicine

## 2024-10-12 ENCOUNTER — Other Ambulatory Visit: Payer: Self-pay | Admitting: Urology

## 2024-10-12 DIAGNOSIS — F339 Major depressive disorder, recurrent, unspecified: Secondary | ICD-10-CM

## 2024-10-12 DIAGNOSIS — K219 Gastro-esophageal reflux disease without esophagitis: Secondary | ICD-10-CM

## 2024-10-12 DIAGNOSIS — N401 Enlarged prostate with lower urinary tract symptoms: Secondary | ICD-10-CM

## 2024-10-16 ENCOUNTER — Ambulatory Visit: Admitting: Physical Therapy

## 2024-10-16 DIAGNOSIS — M25661 Stiffness of right knee, not elsewhere classified: Secondary | ICD-10-CM

## 2024-10-16 DIAGNOSIS — R262 Difficulty in walking, not elsewhere classified: Secondary | ICD-10-CM

## 2024-10-16 DIAGNOSIS — R279 Unspecified lack of coordination: Secondary | ICD-10-CM

## 2024-10-16 DIAGNOSIS — R2681 Unsteadiness on feet: Secondary | ICD-10-CM

## 2024-10-16 DIAGNOSIS — R269 Unspecified abnormalities of gait and mobility: Secondary | ICD-10-CM

## 2024-10-16 DIAGNOSIS — M6281 Muscle weakness (generalized): Secondary | ICD-10-CM | POA: Diagnosis not present

## 2024-10-16 DIAGNOSIS — M25662 Stiffness of left knee, not elsewhere classified: Secondary | ICD-10-CM | POA: Diagnosis not present

## 2024-10-19 NOTE — Therapy (Signed)
 OUTPATIENT PHYSICAL THERAPY LOWER EXTREMITY TREATMENT  Patient Name: Tom Underwood MRN: [id number] DOB:12-20-1950, 73 y.o., male Today's Date: 10/16/2024  END OF SESSION:  PT End of Session - 10/19/24 1025     Visit Number 26    Number of Visits 30    Date for Recertification  10/24/24    PT Start Time 1433    PT Stop Time 1539    PT Time Calculation (min) 66 min    Equipment Utilized During Treatment Gait belt    Activity Tolerance Patient tolerated treatment well;Patient limited by pain;Patient limited by fatigue    Behavior During Therapy [facility] for tasks assessed/performed          Past Medical History:  Diagnosis Date   Anxiety    Arthritis    Atrial fibrillation (HCC)    a.) CHA2DS2-VASc = 4 (age, HTN, CVA x2). b.) s/p 200 J synchronized cardioversion (DCCV) on 12/08/2021. c.) rate/rythum maintained on oral metoprolol  tartrate; chronically anticoagulated using rivaroxaban .   BPH (benign prostatic hyperplasia)    DDD (degenerative disc disease), lumbar    Decreased libido    Depression    Diverticulitis    Diverticulosis    GERD (gastroesophageal reflux disease)    Hepatic steatosis    History of 2019 novel coronavirus disease (COVID-19) 12/28/2020   History of cardiac murmur in childhood    History of kidney stones    HLD (hyperlipidemia)    Hypertension    Hypogonadism in male    OSA on CPAP    Pre-diabetes    Rhinitis, allergic    Sleep apnea    Stroke (HCC) 07/23/2019   a.) LEFT posterior cor radiata. b.) residual RIGHT sided weakness   Past Surgical History:  Procedure Laterality Date   CARDIOVERSION N/A 12/08/2021   Procedure: CARDIOVERSION;  Surgeon: Mady Bruckner, MD;  Location: [facility] ORS;  Service: Cardiovascular;  Laterality: N/A;   COLONOSCOPY     COLONOSCOPY WITH PROPOFOL  N/A 10/06/2023   Procedure: COLONOSCOPY WITH PROPOFOL ;  Surgeon: Therisa Bi, MD;  Location: [facility] ENDOSCOPY;  Service: Gastroenterology;  Laterality: N/A;   EVALUATION  UNDER ANESTHESIA WITH HEMORRHOIDECTOMY N/A 04/15/2022   Procedure: EXAM UNDER ANESTHESIA WITH HEMORRHOIDECTOMY;  Surgeon: Tye Millet, DO;  Location: [facility] ORS;  Service: General;  Laterality: N/A;   EXTERNAL EAR SURGERY     EYE SURGERY     FINGER SURGERY Right    lateration to 5 th digit   KNEE SURGERY     left eye     POLYPECTOMY  10/06/2023   Procedure: POLYPECTOMY;  Surgeon: Therisa Bi, MD;  Location: [facility] ENDOSCOPY;  Service: Gastroenterology;;   RECTAL EXAM UNDER ANESTHESIA N/A 04/23/2022   Procedure: RECTAL EXAM UNDER ANESTHESIA WITH LIGATION OF BLEEDING;  Surgeon: Desiderio Schanz, MD;  Location: [facility] ORS;  Service: General;  Laterality: N/A;   Patient Active Problem List   Diagnosis Date Noted   Chronic venous insufficiency of lower extremity 05/23/2024   History of atrial fibrillation 11/22/2023   Major depression, recurrent, chronic 11/22/2023   Adenomatous polyp of colon 10/06/2023   Gastroesophageal reflux disease without esophagitis 05/17/2022   History of syncope 05/17/2022   History of hemorrhoidectomy 05/17/2022   Vitamin D  deficiency 05/17/2022   Lower extremity edema 05/17/2022   History of rectal bleeding 05/17/2022   Morbid obesity (HCC)    Hypertension    Hemiparesis of right dominant side as late effect of cerebral infarction (HCC) 08/13/2019   History of kidney stones 05/02/2018   ED (  erectile dysfunction) 05/02/2018   Hyperglycemia 01/30/2017   Arthritis of knee, degenerative 01/30/2017   BPH with obstruction/lower urinary tract symptoms 02/03/2016   Dyslipidemia 08/04/2015   OSA on CPAP 06/30/2015   PCP: Glenard Mire, MD  REFERRING PROVIDER: Sowles, Krichna, MD  REFERRING DIAG:  [phone] (ICD-10-CM) - Hemiparesis of right dominant side as late effect of cerebral infarction (HCC)  R26.81 (ICD-10-CM) - Gait instability    THERAPY DIAG:  Muscle weakness (generalized)  Unsteadiness on feet  Abnormality of gait and mobility  Joint stiffness of  both knees  Difficulty in walking, not elsewhere classified  Unspecified lack of coordination  Rationale for Evaluation and Treatment: Rehabilitation  ONSET DATE: chronic  SUBJECTIVE:   SUBJECTIVE STATEMENT: Pt. Reports <5/10 B knee pain walking around PT clinic.  Pt. States he has been walking but limited by knee pain/ endurance.  Pt. Referred back to PT by MD since discharge from PT a few months ago due to regression in LE strength/ endurance.  Pt. Had a R sided stroke on 08/23/2019 and lives with wife and uses [facility] for safety with walking.  Pt. Fearful with walking on varying terrain/ curbs.  No recent falls reported.    PERTINENT HISTORY: Pt. Known well to PT clinic.    PAIN:  Are you having pain? Yes: NPRS scale: 4-5/10  Pain location: B knees Pain description: achy/ persistent Aggravating factors: walking/ step ups Relieving factors: rest  PRECAUTIONS: Fall  RED FLAGS: None   WEIGHT BEARING RESTRICTIONS: No  FALLS:  Has patient fallen in last 6 months? No  LIVING ENVIRONMENT: Lives with: lives with their spouse Lives in: Mobile home Stairs: Yes: External: 5 steps; can reach both Has following equipment at home: Single point cane  OCCUPATION: Retired  PLOF: Independent with household mobility with device  PATIENT GOALS: Increase R sided muscle strength/ walking endurance/ balance.    NEXT MD VISIT: PRN  OBJECTIVE:  Note: Objective measures were completed at Evaluation unless otherwise noted.  PATIENT SURVEYS:  LEFS: 18 out of 80.    COGNITION: Overall cognitive status: Within functional limits for tasks assessed     SENSATION: Light touch: Impaired   EDEMA:  Moderate B lower leg swelling.  Pt. Has compression stockings/ lymphedema pumps.  Marked improvement in lower leg swelling as compared to last PT tx. Session.     MUSCLE LENGTH: Hamstrings: NT Thomas test: Unable to test due to limited supine positioning  POSTURE: rounded shoulders, forward  head, and flexed trunk   PALPATION: (+) B knee joint line and lower leg tenderness.  Pitting edema noted in B lower legs (slightly improved from last PT tx. Session several months ago).    LOWER EXTREMITY ROM:  Active ROM Right eval Left eval  Hip flexion <90 deg. <90 deg.  Hip extension    Hip abduction    Hip adduction    Hip internal rotation    Hip external rotation    Knee flexion    Knee extension    Ankle dorsiflexion    Ankle plantarflexion    Ankle inversion    Ankle eversion     (Blank rows = not tested)  LOWER EXTREMITY MMT:  MMT Right eval Left eval  Hip flexion 4- 4  Hip extension    Hip abduction 4 4+  Hip adduction    Hip internal rotation 4+ 4+  Hip external rotation 4 4  Knee flexion 5 5  Knee extension 5 5  Ankle dorsiflexion  Ankle plantarflexion    Ankle inversion    Ankle eversion     (Blank rows = not tested)  LOWER EXTREMITY SPECIAL TESTS:  NT  FUNCTIONAL TESTS:  5 times sit to stand: 23.44 sec. (1st attempt), 12.67 sec. (2nd attempt).   Berg Balance Scale: TBD  GAIT: Distance walked: in clinic/ outside on sidewalk Assistive device utilized: Single point cane Level of assistance: Modified independence Comments: slow gait pattern with decreased arm swing, heel strike, reciprocal stride length. R foot ER.  No LOB during tx. Session.  Several seated rest breaks (vital WNL)  LEFS: 14/80  Berg: 42/56 5xSTS: 16.03 sec   Berg: 41/56 LEFS: 14/80  MMT Right eval Left eval  Hip flexion 4+ 4+  Hip extension    Hip abduction 4+ 4+  Hip adduction 5 5  Hip internal rotation 4 4+  Hip external rotation 4 4+  Knee flexion 5  5   Knee extension 5* (with hand placement due to swelling) 5 * (with hand placement due to swelling)  Ankle dorsiflexion    Ankle plantarflexion    Ankle inversion    Ankle eversion     (Blank rows = not tested)                                                     MMT Right eval Left eval  Hip flexion 4+  4  Hip extension    Hip abduction 5 5  Hip adduction    Hip internal rotation 4+ 4+  Hip external rotation 4 4+  Knee flexion 5 5  Knee extension 5 5  Ankle dorsiflexion    Ankle plantarflexion    Ankle inversion    Ankle eversion     (Blank rows = not tested) 08/14/24 5xSTS: 12.3 seconds  TREATMENT DATE:10/16/2024  Subjective:  Pt. reports moderate 4/10 NPS in B knees at start of today's session. Pt reports no new falls since last session.  Pt. Still considering getting electric scooters to assist with community mobility.  Pt. Has no way to transport scooter and still waiting on insurance approval.  Pt. Planning to go to beach for the month of January.    There.act.:    Walking in hallway with use of RW and instruction in proper gait/ recip. Step pattern.  Forward marching/ lateral walking in //-bars with cuing for posture 3 laps each  (fatigue)  Taps to 6-inch step with alternating bilateral LEs, 2 x 20 reps (standing rest break in between, without use of UE support)  Resisted gait 2BTB 5x all 4-planes.  Light to no UE assist (challenged).  There.ex.:  Nu-Step B UE/LE level 5 resistance, 10 minutes (seat 10)- discussed pain symptoms/ importance of daily walking/ activity/ HEP  Standing Nautilus lat. Pull downs with bar (50#): 25x.   Standing tricp extension (40#): 25x.    Reviewed HEP  PATIENT EDUCATION:  Education details: Discussed walking program/ home ex. Person educated: Patient Education method: Medical Illustrator Education comprehension: verbalized understanding and returned demonstration  HOME EXERCISE PROGRAM: Standing 3-way hip ex./ walking program  ASSESSMENT:  CLINICAL IMPRESSION: Pt ambulated with RW with hallway ambulation activities and CGA from therapist with no LOB noted. Pt completed resisted gait with no LOB but occasional UE assist on //-bars for safety/ touch reference.  Pt continues to require long  duration seated rest breaks after  each activity to recover from SOB and general, systemic fatigue. Pt reported no increase in bilateral knee pain at end of tx session. Will continue to monitor and progress as able.   OBJECTIVE IMPAIRMENTS: Abnormal gait, cardiopulmonary status limiting activity, decreased activity tolerance, decreased balance, decreased coordination, decreased endurance, decreased mobility, difficulty walking, decreased ROM, decreased strength, hypomobility, impaired flexibility, impaired sensation, impaired UE functional use, improper body mechanics, postural dysfunction, obesity, and pain.   ACTIVITY LIMITATIONS: carrying, lifting, bending, standing, squatting, stairs, transfers, bed mobility, bathing, toileting, dressing, hygiene/grooming, and locomotion level  PARTICIPATION LIMITATIONS: meal prep, cleaning, driving, shopping, and community activity  PERSONAL FACTORS: Past/current experiences are also affecting patient's functional outcome.   REHAB POTENTIAL: Good  CLINICAL DECISION MAKING: Evolving/moderate complexity  EVALUATION COMPLEXITY: Moderate   GOALS: Goals reviewed with patient? Yes  SHORT TERM GOALS: Target date: 09/04/24 Pt. Will increase B hip flexion to >90 deg. In supine position to improve strength/ step length with gait.   Baseline:  see above.  7/15: to be assessed next visit, pt unable to attain supine position  Goal status: Partially met  2.  Pt. Will complete 5xSTS in <10 sec. To improve mobility/ decrease fall risk.   Baseline: 5 times sit to stand: 23.44 sec. (1st attempt), 12.67 sec. (2nd attempt). 7/15: 16.03 sec. 9/10: 12.3 sec. Goal status: Partially met   LONG TERM GOALS: Target date: 10/24/24  Pt. Independent with HEP to increase B LE muscle strength 1/2 muscle grade to improve mobility/ decrease fall risk.   Baseline: see above. 7/15: strength maintained or improved 8:28: gross LE strength maintained Goal status: Partially met  2.  Pt. Will complete Berg balance  test and score >48/56 to decrease fall risk/ improve walking with least assistive device.   Baseline:  43/56,  7/15: 42/56. 8/28: 40/56 Goal status: Not met  3.  Pt. Will increase LEFS to >30 out of 80 to improve pain-free functional mobility.   Baseline: 18 out of 80. 7/15: 14/80. 8/28: 14/80 Goal status: Not met   4.  Pt. Able to ambulate 15 minutes with use of SPC and consistent step pattern on outside surfaces.   Baseline:  increase B knee pain/ limited endurance. 7/15: not able to test due to weather  Goal status: Not met  PLAN:  PT FREQUENCY: 1-2x/week  PT DURATION: 12 weeks  PLANNED INTERVENTIONS: 97750- Physical Performance Testing, 97110-Therapeutic exercises, 97530- Therapeutic activity, 97112- Neuromuscular re-education, 97535- Self Care, [id number]- Manual therapy, [phone]- Gait training, [phone]- Electrical stimulation (unattended), Patient/Family education, Balance training, Stair training, Joint mobilization, Spinal mobilization, Cryotherapy, and Moist heat  PLAN FOR NEXT SESSION:  Improve functional endurance with dynamic ambulatory tasks on even and uneven surfaces with and without environmental obstacles.  1 more tx. Scheduled (discuss POC)   Ozell JAYSON Sero, PT, DPT # [phone] Physical Therapist - [facility] 10/19/2024, 10:28 AM

## 2024-10-19 NOTE — Therapy (Signed)
 OUTPATIENT PHYSICAL THERAPY LOWER EXTREMITY TREATMENT  Patient Name: Tom Underwood MRN: [id number] DOB:[birth year], 73 y.o., male Today's Date: 10/19/2024  END OF SESSION:  PT End of Session - 10/19/24 1025     Visit Number 26    Number of Visits 30    Date for Recertification  10/24/24    PT Start Time 1433    PT Stop Time 1539    PT Time Calculation (min) 66 min    Equipment Utilized During Treatment Gait belt    Activity Tolerance Patient tolerated treatment well;Patient limited by pain;Patient limited by fatigue    Behavior During Therapy [facility] for tasks assessed/performed          Past Medical History:  Diagnosis Date   Anxiety    Arthritis    Atrial fibrillation (HCC)    a.) CHA2DS2-VASc = 4 (age, HTN, CVA x2). b.) s/p 200 J synchronized cardioversion (DCCV) on 12/08/2021. c.) rate/rythum maintained on oral metoprolol  tartrate; chronically anticoagulated using rivaroxaban .   BPH (benign prostatic hyperplasia)    DDD (degenerative disc disease), lumbar    Decreased libido    Depression    Diverticulitis    Diverticulosis    GERD (gastroesophageal reflux disease)    Hepatic steatosis    History of 2019 novel coronavirus disease (COVID-19) 12/28/2020   History of cardiac murmur in childhood    History of kidney stones    HLD (hyperlipidemia)    Hypertension    Hypogonadism in male    OSA on CPAP    Pre-diabetes    Rhinitis, allergic    Sleep apnea    Stroke (HCC) 07/23/2019   a.) LEFT posterior cor radiata. b.) residual RIGHT sided weakness   Past Surgical History:  Procedure Laterality Date   CARDIOVERSION N/A 12/08/2021   Procedure: CARDIOVERSION;  Surgeon: Mady Bruckner, MD;  Location: [facility] ORS;  Service: Cardiovascular;  Laterality: N/A;   COLONOSCOPY     COLONOSCOPY WITH PROPOFOL  N/A 10/06/2023   Procedure: COLONOSCOPY WITH PROPOFOL ;  Surgeon: Therisa Bi, MD;  Location: [facility] ENDOSCOPY;  Service: Gastroenterology;  Laterality: N/A;   EVALUATION  UNDER ANESTHESIA WITH HEMORRHOIDECTOMY N/A 04/15/2022   Procedure: EXAM UNDER ANESTHESIA WITH HEMORRHOIDECTOMY;  Surgeon: Tye Millet, DO;  Location: [facility] ORS;  Service: General;  Laterality: N/A;   EXTERNAL EAR SURGERY     EYE SURGERY     FINGER SURGERY Right    lateration to 5 th digit   KNEE SURGERY     left eye     POLYPECTOMY  10/06/2023   Procedure: POLYPECTOMY;  Surgeon: Therisa Bi, MD;  Location: [facility] ENDOSCOPY;  Service: Gastroenterology;;   RECTAL EXAM UNDER ANESTHESIA N/A 04/23/2022   Procedure: RECTAL EXAM UNDER ANESTHESIA WITH LIGATION OF BLEEDING;  Surgeon: Desiderio Schanz, MD;  Location: [facility] ORS;  Service: General;  Laterality: N/A;   Patient Active Problem List   Diagnosis Date Noted   Chronic venous insufficiency of lower extremity 05/23/2024   History of atrial fibrillation 11/22/2023   Major depression, recurrent, chronic 11/22/2023   Adenomatous polyp of colon 10/06/2023   Gastroesophageal reflux disease without esophagitis 05/17/2022   History of syncope 05/17/2022   History of hemorrhoidectomy 05/17/2022   Vitamin D  deficiency 05/17/2022   Lower extremity edema 05/17/2022   History of rectal bleeding 05/17/2022   Morbid obesity (HCC)    Hypertension    Hemiparesis of right dominant side as late effect of cerebral infarction (HCC) 08/13/2019   History of kidney stones 05/02/2018   ED (  erectile dysfunction) 05/02/2018   Hyperglycemia 01/30/2017   Arthritis of knee, degenerative 01/30/2017   BPH with obstruction/lower urinary tract symptoms 02/03/2016   Dyslipidemia 08/04/2015   OSA on CPAP 06/30/2015   PCP: Glenard Mire, MD  REFERRING PROVIDER: Sowles, Krichna, MD  REFERRING DIAG:  [phone] (ICD-10-CM) - Hemiparesis of right dominant side as late effect of cerebral infarction (HCC)  R26.81 (ICD-10-CM) - Gait instability    THERAPY DIAG:  Muscle weakness (generalized)  Unsteadiness on feet  Abnormality of gait and mobility  Joint stiffness of  both knees  Difficulty in walking, not elsewhere classified  Unspecified lack of coordination  Rationale for Evaluation and Treatment: Rehabilitation  ONSET DATE: chronic  SUBJECTIVE:   SUBJECTIVE STATEMENT: Pt. Reports <5/10 B knee pain walking around PT clinic.  Pt. States he has been walking but limited by knee pain/ endurance.  Pt. Referred back to PT by MD since discharge from PT a few months ago due to regression in LE strength/ endurance.  Pt. Had a R sided stroke on 08/23/2019 and lives with wife and uses [facility] for safety with walking.  Pt. Fearful with walking on varying terrain/ curbs.  No recent falls reported.    PERTINENT HISTORY: Pt. Known well to PT clinic.    PAIN:  Are you having pain? Yes: NPRS scale: 4-5/10  Pain location: B knees Pain description: achy/ persistent Aggravating factors: walking/ step ups Relieving factors: rest  PRECAUTIONS: Fall  RED FLAGS: None   WEIGHT BEARING RESTRICTIONS: No  FALLS:  Has patient fallen in last 6 months? No  LIVING ENVIRONMENT: Lives with: lives with their spouse Lives in: Mobile home Stairs: Yes: External: 5 steps; can reach both Has following equipment at home: Single point cane  OCCUPATION: Retired  PLOF: Independent with household mobility with device  PATIENT GOALS: Increase R sided muscle strength/ walking endurance/ balance.    NEXT MD VISIT: PRN  OBJECTIVE:  Note: Objective measures were completed at Evaluation unless otherwise noted.  PATIENT SURVEYS:  LEFS: 18 out of 80.    COGNITION: Overall cognitive status: Within functional limits for tasks assessed     SENSATION: Light touch: Impaired   EDEMA:  Moderate B lower leg swelling.  Pt. Has compression stockings/ lymphedema pumps.  Marked improvement in lower leg swelling as compared to last PT tx. Session.     MUSCLE LENGTH: Hamstrings: NT Thomas test: Unable to test due to limited supine positioning  POSTURE: rounded shoulders, forward  head, and flexed trunk   PALPATION: (+) B knee joint line and lower leg tenderness.  Pitting edema noted in B lower legs (slightly improved from last PT tx. Session several months ago).    LOWER EXTREMITY ROM:  Active ROM Right eval Left eval  Hip flexion <90 deg. <90 deg.  Hip extension    Hip abduction    Hip adduction    Hip internal rotation    Hip external rotation    Knee flexion    Knee extension    Ankle dorsiflexion    Ankle plantarflexion    Ankle inversion    Ankle eversion     (Blank rows = not tested)  LOWER EXTREMITY MMT:  MMT Right eval Left eval  Hip flexion 4- 4  Hip extension    Hip abduction 4 4+  Hip adduction    Hip internal rotation 4+ 4+  Hip external rotation 4 4  Knee flexion 5 5  Knee extension 5 5  Ankle dorsiflexion  Ankle plantarflexion    Ankle inversion    Ankle eversion     (Blank rows = not tested)  LOWER EXTREMITY SPECIAL TESTS:  NT  FUNCTIONAL TESTS:  5 times sit to stand: 23.44 sec. (1st attempt), 12.67 sec. (2nd attempt).   Berg Balance Scale: TBD  GAIT: Distance walked: in clinic/ outside on sidewalk Assistive device utilized: Single point cane Level of assistance: Modified independence Comments: slow gait pattern with decreased arm swing, heel strike, reciprocal stride length. R foot ER.  No LOB during tx. Session.  Several seated rest breaks (vital WNL)  LEFS: 14/80  Berg: 42/56 5xSTS: 16.03 sec   Berg: 41/56 LEFS: 14/80  MMT Right eval Left eval  Hip flexion 4+ 4+  Hip extension    Hip abduction 4+ 4+  Hip adduction 5 5  Hip internal rotation 4 4+  Hip external rotation 4 4+  Knee flexion 5  5   Knee extension 5* (with hand placement due to swelling) 5 * (with hand placement due to swelling)  Ankle dorsiflexion    Ankle plantarflexion    Ankle inversion    Ankle eversion     (Blank rows = not tested)                                                     MMT Right eval Left eval  Hip flexion 4+  4  Hip extension    Hip abduction 5 5  Hip adduction    Hip internal rotation 4+ 4+  Hip external rotation 4 4+  Knee flexion 5 5  Knee extension 5 5  Ankle dorsiflexion    Ankle plantarflexion    Ankle inversion    Ankle eversion     (Blank rows = not tested) 08/14/24 5xSTS: 12.3 seconds  TREATMENT DATE: 10/19/2024  Subjective:  Pt. reports moderate 6/10 NPS for the left knee, 4/10 NPS on the right knee at start of today's session. Pt reports no new falls since last session.   There.act.:    Walking in hallway with use of RW and added 6 hurdles, cone taps on firm surface, and Airex pad ~ 50 ft  Forward marches in hallway with use of RW, ~ 50 ft  Taps to 6-inch step with alternating bilateral LEs, 2 x 20 reps (standing rest break in between, without use of UE support)  Forward marching in // bars, 2 x down and back  Lateral walking in // bars, 2 x down and back   There.ex.: Nu-Step B UE/LE level 4-5 resistance, 10 minutes (seat 10)- discussed pain symptoms/ importance of daily walking/ activity/ HEP  Standing Nautilus lat. Pull downs with bar (50#): 25 x total  Seated LAQs 5#, and 3 x 10 reps  Seated ankle pumps, 20 reps  PATIENT EDUCATION:  Education details: Discussed walking program/ home ex. Person educated: Patient Education method: Medical Illustrator Education comprehension: verbalized understanding and returned demonstration  HOME EXERCISE PROGRAM: Standing 3-way hip ex./ walking program  ASSESSMENT:  CLINICAL IMPRESSION: Pt ambulated with RW with hallway ambulation activities and CGA from therapist with no LOB noted. Pt completed forward walking in hallway with anterior cone taps while standing on firm surface which he was able to do and remain standing in SLS on BLE's for ~3 seconds each. Pt was progressed to completing LAQs  in seated with 5# AWs which he tolerated well. Pt continues to require long duration seated rest breaks after each  activity to recover from SOB and general, systemic fatigue. Pt reported no increase in bilateral knee pain at end of tx session. Will continue to monitor and progress as able.   OBJECTIVE IMPAIRMENTS: Abnormal gait, cardiopulmonary status limiting activity, decreased activity tolerance, decreased balance, decreased coordination, decreased endurance, decreased mobility, difficulty walking, decreased ROM, decreased strength, hypomobility, impaired flexibility, impaired sensation, impaired UE functional use, improper body mechanics, postural dysfunction, obesity, and pain.   ACTIVITY LIMITATIONS: carrying, lifting, bending, standing, squatting, stairs, transfers, bed mobility, bathing, toileting, dressing, hygiene/grooming, and locomotion level  PARTICIPATION LIMITATIONS: meal prep, cleaning, driving, shopping, and community activity  PERSONAL FACTORS: Past/current experiences are also affecting patient's functional outcome.   REHAB POTENTIAL: Good  CLINICAL DECISION MAKING: Evolving/moderate complexity  EVALUATION COMPLEXITY: Moderate   GOALS: Goals reviewed with patient? Yes  SHORT TERM GOALS: Target date: 09/04/24 Pt. Will increase B hip flexion to >90 deg. In supine position to improve strength/ step length with gait.   Baseline:  see above.  7/15: to be assessed next visit, pt unable to attain supine position  Goal status: Partially met  2.  Pt. Will complete 5xSTS in <10 sec. To improve mobility/ decrease fall risk.   Baseline: 5 times sit to stand: 23.44 sec. (1st attempt), 12.67 sec. (2nd attempt). 7/15: 16.03 sec. 9/10: 12.3 sec. Goal status: Partially met   LONG TERM GOALS: Target date: 10/24/24  Pt. Independent with HEP to increase B LE muscle strength 1/2 muscle grade to improve mobility/ decrease fall risk.   Baseline: see above. 7/15: strength maintained or improved 8:28: gross LE strength maintained Goal status: Partially met  2.  Pt. Will complete Berg balance test  and score >48/56 to decrease fall risk/ improve walking with least assistive device.   Baseline:  43/56,  7/15: 42/56. 8/28: 40/56 Goal status: Not met  3.  Pt. Will increase LEFS to >30 out of 80 to improve pain-free functional mobility.   Baseline: 18 out of 80. 7/15: 14/80. 8/28: 14/80 Goal status: Not met   4.  Pt. Able to ambulate 15 minutes with use of SPC and consistent step pattern on outside surfaces.   Baseline:  increase B knee pain/ limited endurance. 7/15: not able to test due to weather  Goal status: Not met  PLAN:  PT FREQUENCY: 1-2x/week  PT DURATION: 12 weeks  PLANNED INTERVENTIONS: 97750- Physical Performance Testing, 97110-Therapeutic exercises, 97530- Therapeutic activity, 97112- Neuromuscular re-education, 97535- Self Care, [id number]- Manual therapy, [phone]- Gait training, [phone]- Electrical stimulation (unattended), Patient/Family education, Balance training, Stair training, Joint mobilization, Spinal mobilization, Cryotherapy, and Moist heat  PLAN FOR NEXT SESSION:  Improve functional endurance with dynamic ambulatory tasks on even and uneven surfaces with and without environmental obstacles.     Ozell JAYSON Sero, PT, DPT # [phone] Physical Therapist - [facility] 10/19/2024, 10:28 AM

## 2024-10-23 ENCOUNTER — Encounter: Payer: Self-pay | Admitting: Family Medicine

## 2024-10-23 ENCOUNTER — Encounter: Payer: Self-pay | Admitting: Professional

## 2024-10-23 ENCOUNTER — Ambulatory Visit: Payer: PRIVATE HEALTH INSURANCE | Admitting: Professional

## 2024-10-23 ENCOUNTER — Ambulatory Visit (INDEPENDENT_AMBULATORY_CARE_PROVIDER_SITE_OTHER): Admitting: Family Medicine

## 2024-10-23 VITALS — BP 124/70 | HR 60 | Resp 18 | Ht 70.0 in | Wt 294.9 lb

## 2024-10-23 DIAGNOSIS — I4819 Other persistent atrial fibrillation: Secondary | ICD-10-CM | POA: Diagnosis not present

## 2024-10-23 DIAGNOSIS — F411 Generalized anxiety disorder: Secondary | ICD-10-CM

## 2024-10-23 DIAGNOSIS — I872 Venous insufficiency (chronic) (peripheral): Secondary | ICD-10-CM

## 2024-10-23 DIAGNOSIS — N401 Enlarged prostate with lower urinary tract symptoms: Secondary | ICD-10-CM

## 2024-10-23 DIAGNOSIS — R739 Hyperglycemia, unspecified: Secondary | ICD-10-CM | POA: Diagnosis not present

## 2024-10-23 DIAGNOSIS — G4733 Obstructive sleep apnea (adult) (pediatric): Secondary | ICD-10-CM | POA: Diagnosis not present

## 2024-10-23 DIAGNOSIS — N138 Other obstructive and reflux uropathy: Secondary | ICD-10-CM | POA: Diagnosis not present

## 2024-10-23 DIAGNOSIS — I69351 Hemiplegia and hemiparesis following cerebral infarction affecting right dominant side: Secondary | ICD-10-CM

## 2024-10-23 DIAGNOSIS — Z23 Encounter for immunization: Secondary | ICD-10-CM

## 2024-10-23 DIAGNOSIS — I480 Paroxysmal atrial fibrillation: Secondary | ICD-10-CM | POA: Diagnosis not present

## 2024-10-23 DIAGNOSIS — M170 Bilateral primary osteoarthritis of knee: Secondary | ICD-10-CM | POA: Insufficient documentation

## 2024-10-23 DIAGNOSIS — K219 Gastro-esophageal reflux disease without esophagitis: Secondary | ICD-10-CM | POA: Diagnosis not present

## 2024-10-23 DIAGNOSIS — R2681 Unsteadiness on feet: Secondary | ICD-10-CM | POA: Insufficient documentation

## 2024-10-23 DIAGNOSIS — F320 Major depressive disorder, single episode, mild: Secondary | ICD-10-CM | POA: Diagnosis not present

## 2024-10-23 DIAGNOSIS — R202 Paresthesia of skin: Secondary | ICD-10-CM | POA: Diagnosis not present

## 2024-10-23 DIAGNOSIS — F339 Major depressive disorder, recurrent, unspecified: Secondary | ICD-10-CM

## 2024-10-23 DIAGNOSIS — I700 Atherosclerosis of aorta: Secondary | ICD-10-CM | POA: Diagnosis not present

## 2024-10-23 DIAGNOSIS — I10 Essential (primary) hypertension: Secondary | ICD-10-CM | POA: Diagnosis not present

## 2024-10-23 NOTE — Progress Notes (Signed)
 [facility] Counselor/Therapist Progress Note  Patient ID: Tom Underwood, MRN: [id number],    Date: 10/23/2024  Time Spent: 50 minutes 1005-1055am   Treatment Type: Individual Therapy  Risk Assessment: Danger to Self:  No Self-injurious Behavior: No Danger to Others: No Duty to Warn:no  Subjective: This session was held via audio teletherapy. The patient consented to audio teletherapy and was located at his home during this session. He is aware it is the responsibility of the patient to secure confidentiality on her end of the session. The provider was in a private home office for the duration of this session.    The patient arrived on time for his Caregility appointment.   Issues addressed: 1-in person appointment required yearly due to Medicare regulations a-explained regulation to patient though he was not pleased b-provided pt with specific instructions including why, directions, and where to come once arrived c-pt asked to discuss his anxiety related to having to come 45 minutes for this appointment -discussed his successes of having come through 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 the passenger side -clinician suggested the message was his lack of trust for his wife's driving; pt admitted he can see that is what she might interrupt -he denies that he doesn't trust his wife c-planning for successful trips -discussed strategies that the pt could employ to enjoy the ride -asked pt to consider what he could be doing to distract himself from what is happening on the road d-reminded pt that if he was going to continue with his wife driving he needed  to manage his behavior as the passenger  Treatment Plan Problems: Anxiety, Unipolar Depression, Posttraumatic Stress Disorder (PTSD), Grief / Loss Unresolved,  Symptoms: Excessive and/or unrealistic worry that is difficult to control occurring more days than not for at least 6 months about a number of events or activities. Motor tension (e.g., restlessness, tiredness, shakiness, muscle tension). Hypervigilance (e.g., feeling constantly on edge, experiencing concentration difficulties, having trouble falling or staying asleep, exhibiting a general state of irritability). Thoughts dominated by loss coupled with poor concentration, tearful spells, and confusion about the future. Serial losses in life (i.e., deaths, divorces, jobs) that led to depression and discouragement. Strong emotional response of sadness exhibited when losses are discussed. Lack of appetite, weight loss, and/or insomnia as well as other depression signs that occurred since the loss. Has been exposed to a traumatic event involving actual or perceived threat of death or serious injury. Reports response of intense fear, helplessness, or horror to the traumatic event. Experiences disturbing and persistent thoughts, images, and/or perceptions of the traumatic event. Displays significant psychological and/or physiological distress resulting from internal and external clues that are reminiscent of the traumatic event. Intentionally avoids thoughts, feelings, or discussions related to the traumatic event. Intentionally avoids activities, places, people, or objects (e.g., up-armored vehicles) that evoke memories of the event. Displays a significant decline in interest and engagement in activities. Experiences disturbances in sleep. Reports difficulty concentrating as well as feelings of guilt. Reports hypervigilance. Symptoms present more than one month. Impairment in social, occupational, or other areas of functioning. Depressed or  irritable mood. Decrease or loss of appetite. Diminished interest  in or enjoyment of activities. Sleeplessness or hypersomnia. Lack of energy. Poor concentration and indecisiveness. Social withdrawal. Feelings of hopelessness, worthlessness, or inappropriate guilt. Unresolved grief issues. History of chronic or recurrent depression for which the client has taken antidepressant medication, been hospitalized, had outpatient treatment, or had a course of electroconvulsive therapy. Goals: Learn and implement coping skills that result in a reduction of anxiety and worry, and improved daily functioning. Enhance ability to effectively cope with the full variety of life's worries and anxieties. Stabilize anxiety level while increasing ability to function on a daily basis. Begin a healthy grieving process around the loss. Develop an awareness of how the avoidance of grieving has affected life and begin the healing process. Resolve the loss, reengaging in old relationships and initiating new contacts with others. Eliminate or reduce the negative impact trauma related symptoms have on social, occupational, and family functioning. Thinks about or openly discusses the traumatic event with others without experiencing psychological or physiological distress. No longer avoids persons, places, activities, and objects that are reminiscent of the traumatic event. Alleviate depressive symptoms and return to previous level of effective functioning. Recognize, accept, and cope with feelings of depression. Develop healthy thinking patterns and beliefs about self, others, and the world that lead to the alleviation and help prevent the relapse of depression. Appropriately grieve the loss in order to normalize mood and to return to previously adaptive level of functioning. Objectives target date for all objectives is 11/19/2024 Describe situations, thoughts, feelings, and actions associated with anxieties and  worries, their impact on functioning, and attempts to resolve them.   50% Verbalize an understanding of the cognitive, physiological, and behavioral components of anxiety and its treatment.   40% Learn and implement calming skills to reduce overall anxiety and manage anxiety symptoms.  30% Learn and implement a strategy to limit the association between various environmental settings and worry, delaying the worry until a designated worry time.   20% Learn and implement problem-solving strategies for realistically addressing worries.   40% Identify, challenge, and replace biased, fearful self-talk with positive, realistic, and empowering self-talk.   65% Verbalize an understanding of the role that cognitive biases play in excessive irrational worry and persistent anxiety symptoms.   65% Identify and engage in pleasant activities on a daily basis.  65% Learn and implement relapse prevention strategies for managing possible future anxiety symptoms.   40% Learn to accept limitations in life and commit to tolerating, rather than avoiding, unpleasant emotions while accomplishing meaningful goals.  40% Tell in detail the story of the current loss that is triggering symptoms.   100% Identify what stages of grief have been experienced in the continuum of the grieving process.   40% (has not grieved mother's death) Begin verbalizing feelings associated with the loss.   20% Verbalize resolution of feelings of guilt and regret associated with the loss. 30% Identify and voice positives about the deceased loved one including previous positive experiences, positive characteristics, positive aspects of the relationship, and how these things may be remembered.   10% Learn and implement calming skills.   50% Learn and implement guided self-dialogue to manage thoughts, feelings, and urges brought on by encounters with trauma-related situations.   30% Acknowledge the need to implement anger control techniques; learn  and implement anger management techniques.   30% Implement a regular exercise regimen as a stress release technique.   50% Describe current and past experiences with depression including their impact on functioning and attempts to resolve  it.   70% Verbalize an accurate understanding of depression.   70% Identify and replace thoughts and beliefs that support depression.   40% Learn and implement behavioral strategies to overcome depression.   60% Identify important people in life, past and present, and describe the quality, good and poor, of those relationships.   30% Learn and implement problem-solving and decision-making skills.   30% Learn and implement conflict resolution skills to resolve interpersonal problems.   30% Interventions: Use empathy, compassion, and support, allowing the client to tell in detail the story of his/her recent loss. Assign the client to make a list of all the regrets associated with actions toward or relationship with the deceased; process the list content toward resolution of these feelings. Assist the client in engaging in behaviors that celebrate the positive memorable aspects of the loved one and his/her life (e.g., placing memoriam in newspaper on anniversary of death, volunteering time to a favorite cause of the deceased person). Ask the client to discuss and/or list the positive aspects of and memories about his/her relationship with the lost loved one; reinforce the client's expression of positive memories and emotions (e.g., smiling, laughing); encourage the client to share these thoughts with supportive loved ones. Educate the client on the stages of the grieving process and answer any questions he/she may have. Assist the client in identifying the stages of grief that he/she has experienced and which stage he/she is presently working through. Ask the client to bring pictures or mementos connected with his/her loss to a session and talk about them (or assign  Creating a Patent Examiner in the Adult Psychotherapy Administrator, Arts by Jenniffer). Assist the client in identifying and expressing feelings connected with his/her loss. Gently and sensitively explore the client's recollection of the facts of the traumatic incident and his/her cognitive and emotional reactions at the time; assess frequency, intensity, duration, and history of the client's PTSD symptoms and their impact on functioning (see How the Trauma Affects Me in the Adult Psychotherapy Homework Planner by Jongsma); supplement with semi-structured assessment instrument if desired (see The Anxiety Disorders Interview Schedule - Adult Version). Using Cognitive Therapy techniques, explore the client's self-talk and beliefs about self, others, and the future that are a consequence of the trauma (e.g., themes of safety, trust, power, control, esteem, and intimacy); identify and challenge biases; assist him/her in generating appraisals that correct for the biases; test biased and alternatives predictions through behavioral experiments. Teach the client a guided self-dialogue procedure in which he/she learns to recognize maladaptive self-talk, challenges its biases, copes with engendered feelings, overcomes avoidance, and reinforces his/her accomplishments; review and reinforce progress, problem-solve obstacles. Utilize Eye Movement Desensitization and Reprocessing (EMDR) to reduce the client's emotional reactivity to the traumatic event and reduce PTSD symptoms. Assess the client for instances of poor anger management that have led to threats or actual violence that caused damage to property and/or injury to people (or assign Anger Journal in the Adult Psychotherapy Homework Planner by Jenniffer). Teach the client anger management techniques (see the Anger Control Problems chapter in this Planner). Develop and encourage a routine of physical exercise for the client. Teach the client calming skills (e.g.,  breathing retraining, relaxation, calming self-talk) to use in and between sessions when feeling overly distressed. Ask the client to describe his/her past experiences of anxiety and their impact on functioning; assess the focus, excessiveness, and uncontrollability of the worry and the type, frequency, intensity, and duration of his/her anxiety symptoms (consider using a structured interview  such as The Anxiety Disorders Interview Schedule-Adult Version). Explore the client's schema and self-talk that mediate his/her fear response; assist him/her in challenging the biases; replace the distorted messages with reality-based alternatives and positive, realistic self-talk that will increase his/her self-confidence in coping with irrational fears (see Cognitive Therapy of Anxiety Disorders by Gretta armin Mon). Teach the client problem-solving strategies involving specifically defining a problem, generating options for addressing it, evaluating the pros and cons of each option, selecting and implementing an optional action, and reevaluating and refining the action (or assign Applying Problem-Solving to Interpersonal Conflict in the Adult Psychotherapy Homework Planner by Jenniffer). Engage the client in behavioral activation, increasing the client's contact with sources of reward, identifying processes that inhibit activation, and teaching skills to solve life problems (or assign Identify and Schedule Pleasant Activities in the Adult Psychotherapy Homework Planner by Jenniffer); use behavioral techniques such as instruction, rehearsal, role-playing, role reversal as needed to assist adoption into the client's daily life; reinforce success. Discuss with the client the distinction between a lapse and relapse, associating a lapse with an initial and reversible return of worry, anxiety symptoms, or urges to avoid, and relapse with the decision to continue the fearful and avoidant patterns. Identify and rehearse with the  client the management of future situations or circumstances in which lapses could occur. Instruct the client to routinely use new therapeutic skills (e.g., relaxation, cognitive restructuring, exposure, and problem-solving) in daily life to address emergent worries, anxiety, and avoidant tendencies. Develop a coping card on which coping strategies and other important information (e.g., Breathe deeply and relax, Challenge unrealistic worries, Use problem-solving) are written for the client's later use. Use techniques from Acceptance and Commitment Therapy to help client accept uncomfortable realities such as lack of complete control, imperfections, and uncertainty and tolerate unpleasant emotions and thoughts in order to accomplish value-consistent goals. Discuss how generalized anxiety typically involves excessive worry about unrealistic threats, various bodily expressions of tension, overarousal, and hypervigilance, and avoidance of what is threatening that interact to maintain the problem (see Mastery of Your Anxiety and Worry: Therapist Guide by Venson River, and Barlow; Treating Generalized Anxiety Disorder by Rygh and Red). Teach the client calming/relaxation skills (e.g., applied relaxation, progressive muscle relaxation, cue controlled relaxation; mindful breathing; biofeedback) and how to discriminate better between relaxation and tension; teach the client how to apply these skills to his/her daily life (e.g., New Directions in Progressive Muscle Relaxation by Thornell Collier, and Hazlett-Stevens; Treating Generalized Anxiety Disorder by Rygh and Red). Assign the client to read about progressive muscle relaxation and other calming strategies in relevant books or treatment manuals (e.g., Progressive Relaxation Training by Thornell and Collier; Mastery of Your Anxiety and Worry: Workbook by River armin Given). Explain the rationale for using a worry time as well as how it is  to be used; agree upon and implement a worry time with the client. Teach the client how to recognize, stop, and postpone worry to the agreed upon worry time using skills such as thought stopping, relaxation, and redirecting attention (or assign Making Use of the Thought-Stopping Technique and/or Worry Time in the Adult Psychotherapy Homework Planner by Jongsma to assist skill development); encourage use in daily life; review and reinforce success while providing corrective feedback toward improvement. Assist the client in analyzing his/her worries by examining potential biases such as the probability of the negative expectation occurring, the real consequences of it occurring, his/her ability to control the outcome, the worst possible outcome, and his/her ability to accept  it (see Analyze the Probability of a Feared Event in the Adult Psychotherapy Homework Planner by Jenniffer; Cognitive Therapy of Anxiety Disorders by Gretta armin Mon). Encourage the client to share his/her thoughts and feelings of depression; express empathy and build rapport while identifying primary cognitive, behavioral, interpersonal, or other contributors to depression. Conduct Cognitive-Behavioral Therapy (see Cognitive Behavior Therapy by Mon; Overcoming Depression by Marine dunker al.), beginning with helping the client learn the connection among cognition, depressive feelings, and actions. Assign the client to self-monitor thoughts, feelings, and actions in daily journal (e.g., Negative Thoughts Trigger Negative Feelings in the Adult Psychotherapy Homework Planner by Jenniffer; Daily Record of Dysfunctional Thoughts in Cognitive Therapy of Depression by Mon Candida Gentry and Shona); process the journal material to challenge depressive thinking patterns and replace them with reality-based thoughts. Facilitate and reinforce the client's shift from biased depressive self-talk and beliefs to reality-based cognitive messages that  enhance self-confidence and increase adaptive actions (see Positive Self-Talk in the Adult Psychotherapy Homework Planner by Jenniffer). Assist the client in developing skills that increase the likelihood of deriving pleasure from behavioral activation (e.g., assertiveness skills, developing an exercise plan, less internal/more external focus, increased social involvement); reinforce success. Conduct Interpersonal Therapy (see Interpersonal Psychotherapy of Depression by Anne dunker al.), beginning with the assessment of the client's interpersonal inventory of important past and present relationships; develop a case formulation linking depression to grief, interpersonal role disputes, role transitions, and/or interpersonal deficits). Conduct Problem-Solving Therapy (see Problem-Solving Therapy by Francisco and Nezu) using techniques such as psychoeducation, modeling, and role-playing to teach client problem-solving skills (i.e., defining a problem specifically, generating possible solutions, evaluating the pros and cons of each solution, selecting and implementing a plan of action, evaluating the efficacy of the plan, accepting or revising the plan); role-play application of the problem-solving skill to a real life issue (or assign Applying Problem-Solving to Interpersonal Conflict in the Adult Psychotherapy Homework Planner by Jenniffer). Teach conflict resolution skills (e.g., empathy, active listening, I messages, respectful communication, assertiveness without aggression, compromise); use psychoeducation, modeling, role-playing, and rehearsal to work through several current conflicts; assign homework exercises; review and repeat so as to integrate their use into the client's life. Consistent with the treatment model, discuss how cognitive, behavioral, interpersonal, and/or other factors (e.g., family history) contribute to depression.  Diagnosis:Generalized anxiety disorder  Current mild episode of  major depressive disorder without prior episode  Plan:  -meet again Wednesday, November 06, 2024 at 10am virtually.

## 2024-10-23 NOTE — Progress Notes (Signed)
 Name: Tom Underwood   MRN: [id number]    DOB: 1951-06-24   Date:10/23/2024       Progress Note  Subjective  Chief Complaint  Chief Complaint  Patient presents with   Medical Management of Chronic Issues   Discussed the use of AI scribe software for clinical note transcription with the patient, who gave verbal consent to proceed.  History of Present Illness Tom Underwood is a 73 year old male who presents for a regular follow-up visit.  He has a history of kidney stones and benign prostatic hyperplasia (BPH) with lower urinary tract symptoms. In August, he experienced left lower back pain and visited urgent care, where a KUB x-ray showed small stones on both sides. He continues tamsulosin  0.4 mg daily and finasteride  5 mg for BPH. A urine test showed no blood but was darker yellow than preferred.  He has osteoarthritis in both knees and is receiving physical therapy, which helps with balance and knee issues. He continues to use a walker due to weakness on the right side from a previous stroke.  He has atrial fibrillation, treated with ablation but still takes aspirin  and follows up with cardiologist due to high ChadVas score but inability to tolerate Xarelto  ( rectal bleeding). He takes valsartan  80 mg and metoprolol  25 mg twice daily for blood pressure control. No palpitations.   He had a stroke and still has right side hemiparesis, he has a walker and unable to walk long distances due to stroke and bilateral knee OA. He is still seeing PT but needs to use scooters when shopping or when needs to walk further distances. He had to rent a scooter when on vacation recently and asked if able to have one. We will try sending an order, it would be beneficial to decrease risk of fall and increase his quality of life  He has obstructive sleep apnea, he is compliant with CPAP. No headaches upon waking.  He is morbidly obese with a BMI of 42.31 and has lost 2 pounds since August, attributing  this to portion control. He is pleased to be under 300 pounds.  He has chronic venous insufficiency with lymphedema in the lower extremities and uses compression stockings. He is under the care of a vascular specialist.  He has a history of major depression, currently in remission, and takes Wellbutrin  300 mg daily and Buspar  as needed for anxiety.     He also takes pantoprazole  for reflux, which is currently well-controlled.    Patient Active Problem List   Diagnosis Date Noted   Chronic venous insufficiency of lower extremity 05/23/2024   History of atrial fibrillation 11/22/2023   Major depression, recurrent, chronic 11/22/2023   Adenomatous polyp of colon 10/06/2023   Gastroesophageal reflux disease without esophagitis 05/17/2022   History of syncope 05/17/2022   History of hemorrhoidectomy 05/17/2022   Vitamin D  deficiency 05/17/2022   Lower extremity edema 05/17/2022   History of rectal bleeding 05/17/2022   Morbid obesity (HCC)    Hypertension    Hemiparesis of right dominant side as late effect of cerebral infarction (HCC) 08/13/2019   History of kidney stones 05/02/2018   ED (erectile dysfunction) 05/02/2018   Hyperglycemia 01/30/2017   Arthritis of knee, degenerative 01/30/2017   BPH with obstruction/lower urinary tract symptoms 02/03/2016   Dyslipidemia 08/04/2015   OSA on CPAP 06/30/2015    Past Surgical History:  Procedure Laterality Date   CARDIOVERSION N/A 12/08/2021   Procedure: CARDIOVERSION;  Surgeon: Mady Bruckner, MD;  Location: [facility] ORS;  Service: Cardiovascular;  Laterality: N/A;   COLONOSCOPY     COLONOSCOPY WITH PROPOFOL  N/A 10/06/2023   Procedure: COLONOSCOPY WITH PROPOFOL ;  Surgeon: Therisa Bi, MD;  Location: [facility] ENDOSCOPY;  Service: Gastroenterology;  Laterality: N/A;   EVALUATION UNDER ANESTHESIA WITH HEMORRHOIDECTOMY N/A 04/15/2022   Procedure: EXAM UNDER ANESTHESIA WITH HEMORRHOIDECTOMY;  Surgeon: Tye Millet, DO;  Location: [facility] ORS;   Service: General;  Laterality: N/A;   EXTERNAL EAR SURGERY     EYE SURGERY     FINGER SURGERY Right    lateration to 5 th digit   KNEE SURGERY     left eye     POLYPECTOMY  10/06/2023   Procedure: POLYPECTOMY;  Surgeon: Therisa Bi, MD;  Location: [facility] ENDOSCOPY;  Service: Gastroenterology;;   RECTAL EXAM UNDER ANESTHESIA N/A 04/23/2022   Procedure: RECTAL EXAM UNDER ANESTHESIA WITH LIGATION OF BLEEDING;  Surgeon: Desiderio Schanz, MD;  Location: [facility] ORS;  Service: General;  Laterality: N/A;    Family History  Problem Relation Age of Onset   Asthma Mother    Heart disease Mother    Heart attack Mother    Dementia Mother    COPD Mother    Asthma Father    Heart disease Father    Stroke Father     Social History   Tobacco Use   Smoking status: Former    Current packs/day: 0.00    Types: Pipe, Cigarettes    Start date: 02/20/1973    Quit date: 02/20/1974    Years since quitting: 50.7   Smokeless tobacco: Never  Substance Use Topics   Alcohol use: No     Current Outpatient Medications:    aspirin  EC 81 MG tablet, Take 1 tablet (81 mg total) by mouth daily. Swallow whole., Disp: 90 tablet, Rfl: 3   buPROPion  (WELLBUTRIN  XL) 300 MG 24 hr tablet, TAKE 1 TABLET EVERY MORNING, Disp: 90 tablet, Rfl: 0   busPIRone  (BUSPAR ) 10 MG tablet, Take 1 tablet (10 mg total) by mouth 2 (two) times daily., Disp: 180 tablet, Rfl: 1   Cholecalciferol (VITAMIN D ) 125 MCG (5000 UT) CAPS, Take 5,000 Units by mouth daily., Disp: , Rfl:    finasteride  (PROSCAR ) 5 MG tablet, TAKE 1 TABLET EVERY DAY, Disp: 90 tablet, Rfl: 3   fluticasone  (FLONASE ) 50 MCG/ACT nasal spray, SPRAY 2 SPRAYS INTO EACH NOSTRIL EVERY DAY, Disp: 48 mL, Rfl: 0   furosemide  (LASIX ) 20 MG tablet, Take 1 tablet (20 mg total) by mouth as needed., Disp: 90 tablet, Rfl: 3   GLUCOSAMINE-CHONDROITIN PO, Take 2 tablets by mouth in the morning and at bedtime., Disp: , Rfl:    ketoconazole (NIZORAL) 2 % shampoo, Apply topically., Disp: , Rfl:     lidocaine  (XYLOCAINE ) 5 % ointment, Apply 1 application. topically 3 (three) times daily as needed. Apply pea size amount to area as needed, Disp: 35.44 g, Rfl: 0   metoprolol  tartrate (LOPRESSOR ) 25 MG tablet, Take 1 tablet (25 mg total) by mouth 2 (two) times daily., Disp: 180 tablet, Rfl: 3   Multiple Vitamin (MULTIVITAMIN) tablet, Take 1 tablet by mouth daily., Disp: , Rfl:    omega-3 acid ethyl esters (LOVAZA ) 1 g capsule, Take 2 capsules (2 g total) by mouth 2 (two) times daily., Disp: 360 capsule, Rfl: 1   pantoprazole  (PROTONIX ) 20 MG tablet, TAKE 1 TABLET EVERY DAY, Disp: 90 tablet, Rfl: 0   rosuvastatin  (CRESTOR ) 20 MG tablet, Take 1 tablet (20 mg total) by mouth daily.,  Disp: 90 tablet, Rfl: 3   Saccharomyces boulardii (PROBIOTIC) 250 MG CAPS, Take 1 capsule by mouth daily., Disp: 30 capsule, Rfl: 0   tamsulosin  (FLOMAX ) 0.4 MG CAPS capsule, TAKE 1 CAPSULE EVERY DAY, Disp: 90 capsule, Rfl: 0   tetrahydrozoline 0.05 % ophthalmic solution, Place 1 drop into both eyes 2 (two) times daily., Disp: , Rfl:    valsartan  (DIOVAN ) 80 MG tablet, Take 1 tablet (80 mg total) by mouth daily., Disp: 90 tablet, Rfl: 3   VISINE DRY EYE RELIEF 1 % SOLN, Place 1 drop into both eyes daily as needed (dry eyes)., Disp: , Rfl:   Allergies  Allergen Reactions   Penicillins Itching    I personally reviewed active problem list, medication list, allergies, family history with the patient/caregiver today.   ROS  Ten systems reviewed and is negative except as mentioned in HPI    Objective Physical Exam MEASUREMENTS: Weight- 294, BMI- 42.31. CONSTITUTIONAL: Patient appears well-developed and well-nourished. No distress. HEENT: Head atraumatic, normocephalic, neck supple. CARDIOVASCULAR: Normal rate, regular rhythm and normal heart sounds. No murmur heard. Extremities with mild edema. PULMONARY: Effort normal and breath sounds normal. Lungs clear to auscultation. No respiratory distress. ABDOMINAL:  There is no tenderness or distention. MUSCULOSKELETAL: slow gait , using walker, right side hemiparesis  PSYCHIATRIC: Patient has a normal mood and affect. Behavior is normal. Judgment and thought content normal.  Vitals:   10/23/24 1341  BP: 124/70  Pulse: 60  Resp: 18  SpO2: 93%  Weight: 294 lb 14.4 oz (133.8 kg)  Height: 5' 10 (1.778 m)    Body mass index is 42.31 kg/m.   PHQ2/9:    10/23/2024    1:37 PM 07/23/2024   10:36 AM 05/30/2024   12:54 PM 05/23/2024   10:05 AM 11/22/2023   10:44 AM  Depression screen PHQ 2/9  Decreased Interest 1 0 0 1 1  Down, Depressed, Hopeless 1 0 0 1 1  PHQ - 2 Score 2 0 0 2 2  Altered sleeping 0 0 1 0 3  Tired, decreased energy 0 0 0 0 2  Change in appetite 0 0 0 0 2  Feeling bad or failure about yourself  0 0 0 1 1  Trouble concentrating 0 0 0 1 0  Moving slowly or fidgety/restless 0 0 0 0 0  Suicidal thoughts 0 0 0 0 0  PHQ-9 Score 2 0  1  4  10    Difficult doing work/chores Not difficult at all Not difficult at all Not difficult at all Somewhat difficult Very difficult     Data saved with a previous flowsheet row definition    phq 9 is positive  Fall Risk:    10/23/2024    1:37 PM 07/23/2024   10:35 AM 05/30/2024   12:59 PM 05/23/2024   10:02 AM 11/22/2023   10:44 AM  Fall Risk   Falls in the past year? 0 0 0 0 0  Number falls in past yr: 0 0 0 0 0  Injury with Fall? 0 0 0 0 0  Risk for fall due to : No Fall Risks No Fall Risks No Fall Risks No Fall Risks No Fall Risks  Follow up Falls evaluation completed Falls evaluation completed Falls evaluation completed Falls prevention discussed;Education provided;Falls evaluation completed Falls prevention discussed;Education provided;Falls evaluation completed      Assessment & Plan Encounter for immunization Received flu shot today, appropriate timing as it is November. - Continue annual flu vaccination.  Hemiplegia and hemiparesis following cerebral infarction affecting  right dominant side Continues to experience weakness on the right side due to previous stroke. Physical therapy has been beneficial for balance and strength. - Continue physical therapy for balance and strength improvement.  Bilateral primary osteoarthritis of knee Osteoarthritis in both knees with bilateral cystitis. Physical therapy is ongoing, and gel shots are planned for both knees. - Continue physical therapy. - Schedule gel shots for both knees.  Morbid obesity due to excess calories BMI is 42.31, indicating morbid obesity. Weight has decreased by two pounds since August. He is practicing portion control for weight management. - Continue portion control for weight management.  Obstructive sleep apnea Uses CPAP machine nightly and reports feeling rested without headaches upon waking.  Benign prostatic hyperplasia with lower urinary tract symptoms Continues on tamsulosin  and finasteride  for BPH management. Advised to increase water intake. - Continue tamsulosin  0.4 mg daily. - Continue finasteride  5 mg daily. - Increase water intake.  Chronic venous insufficiency of lower extremities Experiences lymphedema. Follows vascular specialist's recommendations, including compression stockings and movement. - Continue compression stockings. - Continue movement as advised by vascular specialist.  Unsteadiness on feet Experiences gait instability, managed with physical therapy. Uses a walker for mobility. - Continue physical therapy for gait stability. - Use walker for mobility. - rx for motorized scooter  Gastroesophageal reflux disease Reports doing well with current medication regimen for GERD. - Continue current GERD medication regimen.  Major depressive disorder in remission Depression is in remission with current medication regimen, including Wellbutrin  and Buspar  as needed for anxiety. - Continue Wellbutrin  300 mg daily. - Use Buspar  as needed for anxiety.  Other persistent  atrial fibrillation, post-ablation, monitoring Status post-ablation with maintained sinus rhythm. Continues on aspirin  due to history of bleeding with anticoagulants. CHET VASc score is 4. Consideration for Watchman procedure discussed but not pursued. - Continue aspirin  81 mg daily. - Follow up with cardiologist for ongoing monitoring.  History of nephrolithiasis Previous episode of left lower back pain due to kidney stones. KUB showed bilateral tigris stones. - Continue increased water intake to prevent stone formation.

## 2024-10-24 ENCOUNTER — Telehealth: Payer: Self-pay

## 2024-10-24 ENCOUNTER — Ambulatory Visit: Payer: Self-pay | Admitting: Family Medicine

## 2024-10-24 ENCOUNTER — Ambulatory Visit: Admitting: Physical Therapy

## 2024-10-24 DIAGNOSIS — R2681 Unsteadiness on feet: Secondary | ICD-10-CM

## 2024-10-24 DIAGNOSIS — M25661 Stiffness of right knee, not elsewhere classified: Secondary | ICD-10-CM

## 2024-10-24 DIAGNOSIS — R279 Unspecified lack of coordination: Secondary | ICD-10-CM

## 2024-10-24 DIAGNOSIS — R269 Unspecified abnormalities of gait and mobility: Secondary | ICD-10-CM | POA: Diagnosis not present

## 2024-10-24 DIAGNOSIS — R262 Difficulty in walking, not elsewhere classified: Secondary | ICD-10-CM

## 2024-10-24 DIAGNOSIS — M6281 Muscle weakness (generalized): Secondary | ICD-10-CM

## 2024-10-24 DIAGNOSIS — M25662 Stiffness of left knee, not elsewhere classified: Secondary | ICD-10-CM | POA: Diagnosis not present

## 2024-10-24 LAB — LIPID PANEL
Cholesterol: 132 mg/dL (ref <200)
HDL: 57 mg/dL (ref >=40)
LDL Cholesterol (Calc): 60 mg/dL
Non-HDL Cholesterol (Calc): 75 mg/dL (ref <130)
Total CHOL/HDL Ratio: 2.3 (calc) (ref <5.0)
Triglycerides: 74 mg/dL (ref <150)

## 2024-10-24 LAB — CBC WITH DIFFERENTIAL/PLATELET
Absolute Lymphocytes: 2291 {cells}/uL (ref 850–3900)
Absolute Monocytes: 552 {cells}/uL (ref 200–950)
Basophils Absolute: 7 {cells}/uL (ref 0–200)
Basophils Relative: 0.1 %
Eosinophils Absolute: 200 {cells}/uL (ref 15–500)
Eosinophils Relative: 2.9 %
HCT: 48.3 % (ref 38.5–50.0)
Hemoglobin: 16.7 g/dL (ref 13.2–17.1)
MCH: 31.4 pg (ref 27.0–33.0)
MCHC: 34.6 g/dL (ref 32.0–36.0)
MCV: 90.8 fL (ref 80.0–100.0)
MPV: 10.5 fL (ref 7.5–12.5)
Monocytes Relative: 8 %
Neutro Abs: 3850 {cells}/uL (ref 1500–7800)
Neutrophils Relative %: 55.8 %
Platelets: 211 Thousand/uL (ref 140–400)
RBC: 5.32 Million/uL (ref 4.20–5.80)
RDW: 13.1 % (ref 11.0–15.0)
Total Lymphocyte: 33.2 %
WBC: 6.9 Thousand/uL (ref 3.8–10.8)

## 2024-10-24 LAB — COMPREHENSIVE METABOLIC PANEL WITH GFR
AG Ratio: 1.7 (calc) (ref 1.0–2.5)
ALT: 20 U/L (ref 9–46)
AST: 18 U/L (ref 10–35)
Albumin: 4.7 g/dL (ref 3.6–5.1)
Alkaline phosphatase (APISO): 108 U/L (ref 35–144)
BUN: 11 mg/dL (ref 7–25)
CO2: 26 mmol/L (ref 20–32)
Calcium: 10 mg/dL (ref 8.6–10.3)
Chloride: 103 mmol/L (ref 98–110)
Creat: 0.9 mg/dL (ref 0.70–1.28)
Globulin: 2.8 g/dL (ref 1.9–3.7)
Glucose, Bld: 96 mg/dL (ref 65–99)
Potassium: 4.2 mmol/L (ref 3.5–5.3)
Sodium: 142 mmol/L (ref 135–146)
Total Bilirubin: 1.5 mg/dL — ABNORMAL HIGH (ref 0.2–1.2)
Total Protein: 7.5 g/dL (ref 6.1–8.1)
eGFR: 91 mL/min/1.73m2 (ref >=60)

## 2024-10-24 LAB — HEMOGLOBIN A1C
Hgb A1c MFr Bld: 5.5 % (ref <5.7)
Mean Plasma Glucose: 111 mg/dL
eAG (mmol/L): 6.2 mmol/L

## 2024-10-24 LAB — B12 AND FOLATE PANEL
Folate: 20.1 ng/mL
Vitamin B-12: 317 pg/mL (ref 200–1100)

## 2024-10-24 LAB — PSA: PSA: 0.22 ng/mL (ref <=4.00)

## 2024-10-24 NOTE — Telephone Encounter (Signed)
 Copied from CRM #[id number]. Topic: General - Other >> Oct 24, 2024  3:17 PM Santiya F wrote: Reason for CRM: Patient is calling in requesting more information on the prescription for a mobility scooter. Patient says he needs to know if it was sent to [employer] so he can reach out to them and speak with them. Please follow up with patient.

## 2024-10-24 NOTE — Telephone Encounter (Signed)
 Advised pt [employer] will be able to provide them what scooter will his insurance covers and further details. Pt verbalized will call the company.

## 2024-10-29 ENCOUNTER — Telehealth: Payer: Self-pay

## 2024-10-29 NOTE — Telephone Encounter (Signed)
 Called patient and lvm informing of results  Per Dr. Glenard: B12 is low, take sublingual B12 daily  All other labs normal

## 2024-10-29 NOTE — Telephone Encounter (Signed)
 Copied from CRM #[id number]. Topic: Clinical - Lab/Test Results >> Oct 29, 2024  2:13 PM Deaijah H wrote: Reason for CRM: Patient called in wanting to if there were Any numbers from recent blood test that concerned Dr. Glenard. Please call

## 2024-11-01 NOTE — Therapy (Signed)
 OUTPATIENT PHYSICAL THERAPY LOWER EXTREMITY TREATMENT/ DISCHARGE  Patient Name: Tom Underwood MRN: [id number] DOB:Jul 20, 1951, 73 y.o., male Today's Date: 10/24/2024  END OF SESSION:  PT End of Session - 11/01/24 2001     Visit Number 27    Number of Visits 30    Date for Recertification  10/24/24    PT Start Time 1124    PT Stop Time 1232    PT Time Calculation (min) 68 min    Equipment Utilized During Treatment Gait belt    Activity Tolerance Patient tolerated treatment well;Patient limited by pain;Patient limited by fatigue    Behavior During Therapy [facility] for tasks assessed/performed          Past Medical History:  Diagnosis Date   Anxiety    Arthritis    Atrial fibrillation (HCC)    a.) CHA2DS2-VASc = 4 (age, HTN, CVA x2). b.) s/p 200 J synchronized cardioversion (DCCV) on 12/08/2021. c.) rate/rythum maintained on oral metoprolol  tartrate; chronically anticoagulated using rivaroxaban .   BPH (benign prostatic hyperplasia)    DDD (degenerative disc disease), lumbar    Decreased libido    Depression    Diverticulitis    Diverticulosis    GERD (gastroesophageal reflux disease)    Hepatic steatosis    History of 2019 novel coronavirus disease (COVID-19) 12/28/2020   History of cardiac murmur in childhood    History of kidney stones    HLD (hyperlipidemia)    Hypertension    Hypogonadism in male    OSA on CPAP    Pre-diabetes    Rhinitis, allergic    Sleep apnea    Stroke (HCC) 07/23/2019   a.) LEFT posterior cor radiata. b.) residual RIGHT sided weakness   Past Surgical History:  Procedure Laterality Date   CARDIOVERSION N/A 12/08/2021   Procedure: CARDIOVERSION;  Surgeon: Mady Bruckner, MD;  Location: [facility] ORS;  Service: Cardiovascular;  Laterality: N/A;   COLONOSCOPY     COLONOSCOPY WITH PROPOFOL  N/A 10/06/2023   Procedure: COLONOSCOPY WITH PROPOFOL ;  Surgeon: Therisa Bi, MD;  Location: [facility] ENDOSCOPY;  Service: Gastroenterology;  Laterality: N/A;    EVALUATION UNDER ANESTHESIA WITH HEMORRHOIDECTOMY N/A 04/15/2022   Procedure: EXAM UNDER ANESTHESIA WITH HEMORRHOIDECTOMY;  Surgeon: Tye Millet, DO;  Location: [facility] ORS;  Service: General;  Laterality: N/A;   EXTERNAL EAR SURGERY     EYE SURGERY     FINGER SURGERY Right    lateration to 5 th digit   KNEE SURGERY     left eye     POLYPECTOMY  10/06/2023   Procedure: POLYPECTOMY;  Surgeon: Therisa Bi, MD;  Location: [facility] ENDOSCOPY;  Service: Gastroenterology;;   RECTAL EXAM UNDER ANESTHESIA N/A 04/23/2022   Procedure: RECTAL EXAM UNDER ANESTHESIA WITH LIGATION OF BLEEDING;  Surgeon: Desiderio Schanz, MD;  Location: [facility] ORS;  Service: General;  Laterality: N/A;   Patient Active Problem List   Diagnosis Date Noted   Bilateral primary osteoarthritis of knee 10/23/2024   Gait instability 10/23/2024   Chronic venous insufficiency of lower extremity 05/23/2024   History of atrial fibrillation 11/22/2023   Major depression, recurrent, chronic 11/22/2023   Adenomatous polyp of colon 10/06/2023   Gastroesophageal reflux disease without esophagitis 05/17/2022   History of syncope 05/17/2022   History of hemorrhoidectomy 05/17/2022   Vitamin D  deficiency 05/17/2022   Lower extremity edema 05/17/2022   History of rectal bleeding 05/17/2022   Morbid obesity (HCC)    Hypertension    Hemiparesis of right dominant side as late effect of  cerebral infarction (HCC) 08/13/2019   History of kidney stones 05/02/2018   ED (erectile dysfunction) 05/02/2018   Hyperglycemia 01/30/2017   Arthritis of knee, degenerative 01/30/2017   BPH with obstruction/lower urinary tract symptoms 02/03/2016   Dyslipidemia 08/04/2015   OSA on CPAP 06/30/2015   PCP: Glenard Mire, MD  REFERRING PROVIDER: Glenard Mire, MD  REFERRING DIAG:  [id number] (ICD-10-CM) - Hemiparesis of right dominant side as late effect of cerebral infarction (HCC)  R26.81 (ICD-10-CM) - Gait instability    THERAPY DIAG:  Muscle weakness  (generalized)  Unsteadiness on feet  Abnormality of gait and mobility  Joint stiffness of both knees  Difficulty in walking, not elsewhere classified  Unspecified lack of coordination  Rationale for Evaluation and Treatment: Rehabilitation  ONSET DATE: chronic  SUBJECTIVE:   SUBJECTIVE STATEMENT: Pt. Reports <5/10 B knee pain walking around PT clinic.  Pt. States he has been walking but limited by knee pain/ endurance.  Pt. Referred back to PT by MD since discharge from PT a few months ago due to regression in LE strength/ endurance.  Pt. Had a R sided stroke on 08/23/2019 and lives with wife and uses [facility] for safety with walking.  Pt. Fearful with walking on varying terrain/ curbs.  No recent falls reported.    PERTINENT HISTORY: Pt. Known well to PT clinic.    PAIN:  Are you having pain? Yes: NPRS scale: 4-5/10  Pain location: B knees Pain description: achy/ persistent Aggravating factors: walking/ step ups Relieving factors: rest  PRECAUTIONS: Fall  RED FLAGS: None   WEIGHT BEARING RESTRICTIONS: No  FALLS:  Has patient fallen in last 6 months? No  LIVING ENVIRONMENT: Lives with: lives with their spouse Lives in: Mobile home Stairs: Yes: External: 5 steps; can reach both Has following equipment at home: Single point cane  OCCUPATION: Retired  PLOF: Independent with household mobility with device  PATIENT GOALS: Increase R sided muscle strength/ walking endurance/ balance.    NEXT MD VISIT: PRN  OBJECTIVE:  Note: Objective measures were completed at Evaluation unless otherwise noted.  PATIENT SURVEYS:  LEFS: 18 out of 80.    COGNITION: Overall cognitive status: Within functional limits for tasks assessed     SENSATION: Light touch: Impaired   EDEMA:  Moderate B lower leg swelling.  Pt. Has compression stockings/ lymphedema pumps.  Marked improvement in lower leg swelling as compared to last PT tx. Session.     MUSCLE LENGTH: Hamstrings: NT Thomas  test: Unable to test due to limited supine positioning  POSTURE: rounded shoulders, forward head, and flexed trunk   PALPATION: (+) B knee joint line and lower leg tenderness.  Pitting edema noted in B lower legs (slightly improved from last PT tx. Session several months ago).    LOWER EXTREMITY ROM:  Active ROM Right eval Left eval  Hip flexion <90 deg. <90 deg.  Hip extension    Hip abduction    Hip adduction    Hip internal rotation    Hip external rotation    Knee flexion    Knee extension    Ankle dorsiflexion    Ankle plantarflexion    Ankle inversion    Ankle eversion     (Blank rows = not tested)  LOWER EXTREMITY MMT:  MMT Right eval Left eval  Hip flexion 4- 4  Hip extension    Hip abduction 4 4+  Hip adduction    Hip internal rotation 4+ 4+  Hip external rotation 4 4  Knee flexion 5 5  Knee extension 5 5  Ankle dorsiflexion    Ankle plantarflexion    Ankle inversion    Ankle eversion     (Blank rows = not tested)  LOWER EXTREMITY SPECIAL TESTS:  NT  FUNCTIONAL TESTS:  5 times sit to stand: 23.44 sec. (1st attempt), 12.67 sec. (2nd attempt).   Berg Balance Scale: TBD  GAIT: Distance walked: in clinic/ outside on sidewalk Assistive device utilized: Single point cane Level of assistance: Modified independence Comments: slow gait pattern with decreased arm swing, heel strike, reciprocal stride length. R foot ER.  No LOB during tx. Session.  Several seated rest breaks (vital WNL)  LEFS: 14/80  Berg: 42/56 5xSTS: 16.03 sec   Berg: 41/56 LEFS: 14/80  MMT Right eval Left eval  Hip flexion 4+ 4+  Hip extension    Hip abduction 4+ 4+  Hip adduction 5 5  Hip internal rotation 4 4+  Hip external rotation 4 4+  Knee flexion 5  5   Knee extension 5* (with hand placement due to swelling) 5 * (with hand placement due to swelling)  Ankle dorsiflexion    Ankle plantarflexion    Ankle inversion    Ankle eversion     (Blank rows = not tested)                                                      MMT Right eval Left eval  Hip flexion 4+ 4  Hip extension    Hip abduction 5 5  Hip adduction    Hip internal rotation 4+ 4+  Hip external rotation 4 4+  Knee flexion 5 5  Knee extension 5 5  Ankle dorsiflexion    Ankle plantarflexion    Ankle inversion    Ankle eversion     (Blank rows = not tested) 08/14/24 5xSTS: 12.3 seconds  TREATMENT DATE:10/16/2024  Subjective:  Pt. reports moderate 4/10 NPS in B knees at start of today's session.  No new complaints and no new falls since last session.  Pt. Planning to go to beach for the month of January and hoping to have scooters for mobility.    There.ex.:  Nu-Step B UE/LE level 5 resistance, 10 minutes (seat 10)- discussed pain symptoms/ upcoming trip to beach.    Standing Nautilus lat. Pull downs with bar (50#): 25x.   Standing tricp extension (40#): 25x.    Reassessment of B shoulder AROM in standing posture.   Reviewed HEP  There.act.:    Walking in hallway with use of SPC and instruction in proper gait/ recip. Step pattern.  SBA for safety/ cuing.    Forward marching/ lateral walking in //-bars with cuing for posture/ step length.  3 laps each  (fatigue noted and seated rest break).    Sit to stand from gray chair/ reassessment of goals.     PATIENT EDUCATION:  Education details: Discussed walking program/ home ex. Person educated: Patient Education method: Medical Illustrator Education comprehension: verbalized understanding and returned demonstration  HOME EXERCISE PROGRAM: Standing 3-way hip ex./ walking program  ASSESSMENT:  CLINICAL IMPRESSION: Pt demonstrates ability to ambulate short distances with use of SPC but benefits from RW to increase upright posture/ safety.   Pt. Easily fatigues with prolonged standing/ walking tasks and requires continued seated rest breaks during  tx. Session.  Pt reported no increase in bilateral knee pain at end of tx  session but limited with squats/ sit to stands secondary to pain.  Pt. Progress with PT has been limited and minimal progress made towards PT goals.  Pt. Will benefit from a daily walking/ HEP at this time.  PT encouraged pt. The benefits of gym based ex./ use of Nustep for muscle endurance.     OBJECTIVE IMPAIRMENTS: Abnormal gait, cardiopulmonary status limiting activity, decreased activity tolerance, decreased balance, decreased coordination, decreased endurance, decreased mobility, difficulty walking, decreased ROM, decreased strength, hypomobility, impaired flexibility, impaired sensation, impaired UE functional use, improper body mechanics, postural dysfunction, obesity, and pain.   ACTIVITY LIMITATIONS: carrying, lifting, bending, standing, squatting, stairs, transfers, bed mobility, bathing, toileting, dressing, hygiene/grooming, and locomotion level  PARTICIPATION LIMITATIONS: meal prep, cleaning, driving, shopping, and community activity  PERSONAL FACTORS: Past/current experiences are also affecting patient's functional outcome.   REHAB POTENTIAL: Good  CLINICAL DECISION MAKING: Evolving/moderate complexity  EVALUATION COMPLEXITY: Moderate   GOALS: Goals reviewed with patient? Yes  SHORT TERM GOALS: Target date: 09/04/24 Pt. Will increase B hip flexion to >90 deg. In supine position to improve strength/ step length with gait.   Baseline:  see above.  7/15: to be assessed next visit, pt unable to attain supine position  Goal status: Partially met  2.  Pt. Will complete 5xSTS in <10 sec. To improve mobility/ decrease fall risk.   Baseline: 5 times sit to stand: 23.44 sec. (1st attempt), 12.67 sec. (2nd attempt). 7/15: 16.03 sec. 9/10: 12.3 sec. Goal status: Partially met   LONG TERM GOALS: Target date: 10/24/24  Pt. Independent with HEP to increase B LE muscle strength 1/2 muscle grade to improve mobility/ decrease fall risk.   Baseline: see above. 7/15: strength maintained  or improved 8:28: gross LE strength maintained Goal status: Partially met  2.  Pt. Will complete Berg balance test and score >48/56 to decrease fall risk/ improve walking with least assistive device.   Baseline:  43/56,  7/15: 42/56. 8/28: 40/56 Goal status: Not met  3.  Pt. Will increase LEFS to >30 out of 80 to improve pain-free functional mobility.   Baseline: 18 out of 80. 7/15: 14/80. 8/28: 14/80.  11/20: 21 out of 80 Goal status: Not met   4.  Pt. Able to ambulate 15 minutes with use of SPC and consistent step pattern on outside surfaces.   Baseline:  increase B knee pain/ limited endurance. 7/15: not able to test due to weather.  11/20: able to use SPC but for limited distance (fatigue) Goal status: Not met  PLAN:  PT FREQUENCY: 1-2x/week  PT DURATION: 12 weeks  PLANNED INTERVENTIONS: 97750- Physical Performance Testing, 97110-Therapeutic exercises, 97530- Therapeutic activity, 97112- Neuromuscular re-education, 97535- Self Care, [id number]- Manual therapy, [phone]- Gait training, [phone]- Electrical stimulation (unattended), Patient/Family education, Balance training, Stair training, Joint mobilization, Spinal mobilization, Cryotherapy, and Moist heat  PLAN FOR NEXT SESSION:  Discharge to HEP at this time.    Ozell JAYSON Sero, PT, DPT # [phone] Physical Therapist - [facility] 11/01/2024, 8:02 PM

## 2024-11-04 ENCOUNTER — Telehealth: Payer: Self-pay

## 2024-11-04 DIAGNOSIS — I872 Venous insufficiency (chronic) (peripheral): Secondary | ICD-10-CM

## 2024-11-04 DIAGNOSIS — I69351 Hemiplegia and hemiparesis following cerebral infarction affecting right dominant side: Secondary | ICD-10-CM

## 2024-11-04 NOTE — Telephone Encounter (Signed)
 Called pt to advice him, original order was sent to [employer]. According to message in website parachute they do not support mobile scooters. Will fax the order now to [phone], which they provided.

## 2024-11-04 NOTE — Addendum Note (Signed)
 Addended by: RENTERIA-GARCIA, Wendy Mikles on: 11/04/2024 04:03 PM   Modules accepted: Orders

## 2024-11-04 NOTE — Telephone Encounter (Signed)
 Copied from CRM #[id number]. Topic: Clinical - Order For Equipment >> Nov 04, 2024  2:34 PM [name] wrote: Reason for CRM: Patient inquiring where prescription for scooter was sent to. Patient requesting their contact information as he has not heard from anyone.   Requesting callback: [phone]

## 2024-11-06 ENCOUNTER — Ambulatory Visit: Payer: PRIVATE HEALTH INSURANCE | Admitting: Professional

## 2024-11-06 ENCOUNTER — Encounter: Payer: Self-pay | Admitting: Professional

## 2024-11-06 ENCOUNTER — Other Ambulatory Visit: Payer: Self-pay | Admitting: Cardiology

## 2024-11-06 DIAGNOSIS — F339 Major depressive disorder, recurrent, unspecified: Secondary | ICD-10-CM

## 2024-11-06 DIAGNOSIS — F411 Generalized anxiety disorder: Secondary | ICD-10-CM

## 2024-11-06 NOTE — Progress Notes (Signed)
 [facility] Counselor/Therapist Progress Note  Patient ID: Tom Underwood, MRN: [id number],    Date: 11/06/2024  Time Spent: 48 minutes 1006-1054am   Treatment Type: Individual Therapy  Risk Assessment: Danger to Self:  No Self-injurious Behavior: No Danger to Others: No Duty to Warn:no  Subjective: This session was held via audio teletherapy. The patient consented to audio teletherapy and was located at his home during this session. He is aware it is the responsibility of the patient to secure confidentiality on her end of the session. The provider was in a private home office for the duration of this session.    The patient arrived on time for his Caregility appointment.   Issues addressed: 1-reviewed last session 2-Thanksgiving -breakfast at [employer] -ordered Thanksgiving lunch and took it home -had dinner in the evening 3-riding as passenger -continues to try and make it better but admits he is continuing the behavior -he still pumps the brakes and is not comfortable -he has tried closing his eyes but opens his eyes when he thinks something is going to happen and freaks out 4-discussed ERP again -shared with pt that Clinician has taken him as far as able and that he would best be served with an ERP therapist -reminded patient that we previously dicussed ERP referral and he was not interested 5-feeling anxious about the thought of a new therapist -reports his wife doesn't understand -they get into disagreements over him not driving and being critical of her driving -he admits when he is driving he considers himself a defensive driver  Treatment Plan Problems: Anxiety, Unipolar Depression, Posttraumatic Stress Disorder (PTSD), Grief / Loss Unresolved Symptoms: Excessive and/or unrealistic worry that is difficult to control occurring more days than not for at least 6 months about a number of events or activities. Motor tension (e.g., restlessness,  tiredness, shakiness, muscle tension). Hypervigilance (e.g., feeling constantly on edge, experiencing concentration difficulties, having trouble falling or staying asleep, exhibiting a general state of irritability). Thoughts dominated by loss coupled with poor concentration, tearful spells, and confusion about the future. Serial losses in life (i.e., deaths, divorces, jobs) that led to depression and discouragement. Strong emotional response of sadness exhibited when losses are discussed. Lack of appetite, weight loss, and/or insomnia as well as other depression signs that occurred since the loss. Has been exposed to a traumatic event involving actual or perceived threat of death or serious injury. Reports response of intense fear, helplessness, or horror to the traumatic event. Experiences disturbing and persistent thoughts, images, and/or perceptions of the traumatic event. Displays significant psychological and/or physiological distress resulting from internal and external clues that are reminiscent of the traumatic event. Intentionally avoids thoughts, feelings, or discussions related to the traumatic event. Intentionally avoids activities, places, people, or objects (e.g., up-armored vehicles) that evoke memories of the event. Displays a significant decline in interest and engagement in activities. Experiences disturbances in sleep. Reports difficulty concentrating as well as feelings of guilt. Reports hypervigilance. Symptoms present more than one month. Impairment in social, occupational, or other areas of functioning. Depressed or irritable mood. Decrease or loss of appetite. Diminished interest in or enjoyment of activities. Sleeplessness or hypersomnia. Lack of energy. Poor concentration and indecisiveness. Social withdrawal. Feelings of hopelessness, worthlessness, or inappropriate guilt. Unresolved grief issues. History of chronic or recurrent depression for which the client  has taken antidepressant medication, been hospitalized, had outpatient treatment, or had a course of electroconvulsive therapy. Goals: Learn and implement coping skills that result in a reduction  of anxiety and worry, and improved daily functioning. Enhance ability to effectively cope with the full variety of life's worries and anxieties. Stabilize anxiety level while increasing ability to function on a daily basis. Begin a healthy grieving process around the loss. Develop an awareness of how the avoidance of grieving has affected life and begin the healing process. Resolve the loss, reengaging in old relationships and initiating new contacts with others. Eliminate or reduce the negative impact trauma related symptoms have on social, occupational, and family functioning. Thinks about or openly discusses the traumatic event with others without experiencing psychological or physiological distress. No longer avoids persons, places, activities, and objects that are reminiscent of the traumatic event. Alleviate depressive symptoms and return to previous level of effective functioning. Recognize, accept, and cope with feelings of depression. Develop healthy thinking patterns and beliefs about self, others, and the world that lead to the alleviation and help prevent the relapse of depression. Appropriately grieve the loss in order to normalize mood and to return to previously adaptive level of functioning. Objectives target date for all objectives is 11/19/2024 Describe situations, thoughts, feelings, and actions associated with anxieties and worries, their impact on functioning, and attempts to resolve them.   50% Verbalize an understanding of the cognitive, physiological, and behavioral components of anxiety and its treatment.   40% Learn and implement calming skills to reduce overall anxiety and manage anxiety symptoms.  30% Learn and implement a strategy to limit the association between various  environmental settings and worry, delaying the worry until a designated worry time.   20% Learn and implement problem-solving strategies for realistically addressing worries.   40% Identify, challenge, and replace biased, fearful self-talk with positive, realistic, and empowering self-talk.   65% Verbalize an understanding of the role that cognitive biases play in excessive irrational worry and persistent anxiety symptoms.   65% Identify and engage in pleasant activities on a daily basis.  65% Learn and implement relapse prevention strategies for managing possible future anxiety symptoms.   40% Learn to accept limitations in life and commit to tolerating, rather than avoiding, unpleasant emotions while accomplishing meaningful goals.  40% Tell in detail the story of the current loss that is triggering symptoms.   100% Identify what stages of grief have been experienced in the continuum of the grieving process.   40% (has not grieved mother's death) Begin verbalizing feelings associated with the loss.   20% Verbalize resolution of feelings of guilt and regret associated with the loss. 30% Identify and voice positives about the deceased loved one including previous positive experiences, positive characteristics, positive aspects of the relationship, and how these things may be remembered.   10% Learn and implement calming skills.   50% Learn and implement guided self-dialogue to manage thoughts, feelings, and urges brought on by encounters with trauma-related situations.   30% Acknowledge the need to implement anger control techniques; learn and implement anger management techniques.   30% Implement a regular exercise regimen as a stress release technique.   50% Describe current and past experiences with depression including their impact on functioning and attempts to resolve it.   70% Verbalize an accurate understanding of depression.   70% Identify and replace thoughts and beliefs that support  depression.   40% Learn and implement behavioral strategies to overcome depression.   60% Identify important people in life, past and present, and describe the quality, good and poor, of those relationships.   30% Learn and implement problem-solving and decision-making skills.  30% Learn and implement conflict resolution skills to resolve interpersonal problems.   30% Interventions: Use empathy, compassion, and support, allowing the client to tell in detail the story of his/her recent loss. Assign the client to make a list of all the regrets associated with actions toward or relationship with the deceased; process the list content toward resolution of these feelings. Assist the client in engaging in behaviors that celebrate the positive memorable aspects of the loved one and his/her life (e.g., placing memoriam in newspaper on anniversary of death, volunteering time to a favorite cause of the deceased person). Ask the client to discuss and/or list the positive aspects of and memories about his/her relationship with the lost loved one; reinforce the client's expression of positive memories and emotions (e.g., smiling, laughing); encourage the client to share these thoughts with supportive loved ones. Educate the client on the stages of the grieving process and answer any questions he/she may have. Assist the client in identifying the stages of grief that he/she has experienced and which stage he/she is presently working through. Ask the client to bring pictures or mementos connected with his/her loss to a session and talk about them (or assign Creating a Patent Examiner in the Adult Psychotherapy Administrator, Arts by Jenniffer). Assist the client in identifying and expressing feelings connected with his/her loss. Gently and sensitively explore the client's recollection of the facts of the traumatic incident and his/her cognitive and emotional reactions at the time; assess frequency, intensity,  duration, and history of the client's PTSD symptoms and their impact on functioning (see How the Trauma Affects Me in the Adult Psychotherapy Homework Planner by Jongsma); supplement with semi-structured assessment instrument if desired (see The Anxiety Disorders Interview Schedule - Adult Version). Using Cognitive Therapy techniques, explore the client's self-talk and beliefs about self, others, and the future that are a consequence of the trauma (e.g., themes of safety, trust, power, control, esteem, and intimacy); identify and challenge biases; assist him/her in generating appraisals that correct for the biases; test biased and alternatives predictions through behavioral experiments. Teach the client a guided self-dialogue procedure in which he/she learns to recognize maladaptive self-talk, challenges its biases, copes with engendered feelings, overcomes avoidance, and reinforces his/her accomplishments; review and reinforce progress, problem-solve obstacles. Utilize Eye Movement Desensitization and Reprocessing (EMDR) to reduce the client's emotional reactivity to the traumatic event and reduce PTSD symptoms. Assess the client for instances of poor anger management that have led to threats or actual violence that caused damage to property and/or injury to people (or assign Anger Journal in the Adult Psychotherapy Homework Planner by Jenniffer). Teach the client anger management techniques (see the Anger Control Problems chapter in this Planner). Develop and encourage a routine of physical exercise for the client. Teach the client calming skills (e.g., breathing retraining, relaxation, calming self-talk) to use in and between sessions when feeling overly distressed. Ask the client to describe his/her past experiences of anxiety and their impact on functioning; assess the focus, excessiveness, and uncontrollability of the worry and the type, frequency, intensity, and duration of his/her anxiety symptoms  (consider using a structured interview such as The Anxiety Disorders Interview Schedule-Adult Version). Explore the client's schema and self-talk that mediate his/her fear response; assist him/her in challenging the biases; replace the distorted messages with reality-based alternatives and positive, realistic self-talk that will increase his/her self-confidence in coping with irrational fears (see Cognitive Therapy of Anxiety Disorders by Gretta armin Mon). Teach the client problem-solving strategies involving specifically defining a problem,  generating options for addressing it, evaluating the pros and cons of each option, selecting and implementing an optional action, and reevaluating and refining the action (or assign Applying Problem-Solving to Interpersonal Conflict in the Adult Psychotherapy Homework Planner by Jenniffer). Engage the client in behavioral activation, increasing the client's contact with sources of reward, identifying processes that inhibit activation, and teaching skills to solve life problems (or assign Identify and Schedule Pleasant Activities in the Adult Psychotherapy Homework Planner by Jenniffer); use behavioral techniques such as instruction, rehearsal, role-playing, role reversal as needed to assist adoption into the client's daily life; reinforce success. Discuss with the client the distinction between a lapse and relapse, associating a lapse with an initial and reversible return of worry, anxiety symptoms, or urges to avoid, and relapse with the decision to continue the fearful and avoidant patterns. Identify and rehearse with the client the management of future situations or circumstances in which lapses could occur. Instruct the client to routinely use new therapeutic skills (e.g., relaxation, cognitive restructuring, exposure, and problem-solving) in daily life to address emergent worries, anxiety, and avoidant tendencies. Develop a coping card on which coping strategies and  other important information (e.g., Breathe deeply and relax, Challenge unrealistic worries, Use problem-solving) are written for the client's later use. Use techniques from Acceptance and Commitment Therapy to help client accept uncomfortable realities such as lack of complete control, imperfections, and uncertainty and tolerate unpleasant emotions and thoughts in order to accomplish value-consistent goals. Discuss how generalized anxiety typically involves excessive worry about unrealistic threats, various bodily expressions of tension, overarousal, and hypervigilance, and avoidance of what is threatening that interact to maintain the problem (see Mastery of Your Anxiety and Worry: Therapist Guide by Venson River, and Barlow; Treating Generalized Anxiety Disorder by Rygh and Red). Teach the client calming/relaxation skills (e.g., applied relaxation, progressive muscle relaxation, cue controlled relaxation; mindful breathing; biofeedback) and how to discriminate better between relaxation and tension; teach the client how to apply these skills to his/her daily life (e.g., New Directions in Progressive Muscle Relaxation by Thornell Collier, and Hazlett-Stevens; Treating Generalized Anxiety Disorder by Rygh and Red). Assign the client to read about progressive muscle relaxation and other calming strategies in relevant books or treatment manuals (e.g., Progressive Relaxation Training by Thornell and Collier; Mastery of Your Anxiety and Worry: Workbook by River armin Given). Explain the rationale for using a worry time as well as how it is to be used; agree upon and implement a worry time with the client. Teach the client how to recognize, stop, and postpone worry to the agreed upon worry time using skills such as thought stopping, relaxation, and redirecting attention (or assign Making Use of the Thought-Stopping Technique and/or Worry Time in the Adult Psychotherapy Homework Planner  by Jongsma to assist skill development); encourage use in daily life; review and reinforce success while providing corrective feedback toward improvement. Assist the client in analyzing his/her worries by examining potential biases such as the probability of the negative expectation occurring, the real consequences of it occurring, his/her ability to control the outcome, the worst possible outcome, and his/her ability to accept it (see Analyze the Probability of a Feared Event in the Adult Psychotherapy Homework Planner by Jenniffer; Cognitive Therapy of Anxiety Disorders by Gretta armin Mon). Encourage the client to share his/her thoughts and feelings of depression; express empathy and build rapport while identifying primary cognitive, behavioral, interpersonal, or other contributors to depression. Conduct Cognitive-Behavioral Therapy (see Cognitive Behavior Therapy by Mon; Overcoming Depression by Marine,  et al.), beginning with helping the client learn the connection among cognition, depressive feelings, and actions. Assign the client to self-monitor thoughts, feelings, and actions in daily journal (e.g., Negative Thoughts Trigger Negative Feelings in the Adult Psychotherapy Homework Planner by Jenniffer; Daily Record of Dysfunctional Thoughts in Cognitive Therapy of Depression by Almarie Candida Gentry and Shona); process the journal material to challenge depressive thinking patterns and replace them with reality-based thoughts. Facilitate and reinforce the client's shift from biased depressive self-talk and beliefs to reality-based cognitive messages that enhance self-confidence and increase adaptive actions (see Positive Self-Talk in the Adult Psychotherapy Homework Planner by Jenniffer). Assist the client in developing skills that increase the likelihood of deriving pleasure from behavioral activation (e.g., assertiveness skills, developing an exercise plan, less internal/more external focus, increased  social involvement); reinforce success. Conduct Interpersonal Therapy (see Interpersonal Psychotherapy of Depression by Anne dunker al.), beginning with the assessment of the client's interpersonal inventory of important past and present relationships; develop a case formulation linking depression to grief, interpersonal role disputes, role transitions, and/or interpersonal deficits). Conduct Problem-Solving Therapy (see Problem-Solving Therapy by Francisco and Nezu) using techniques such as psychoeducation, modeling, and role-playing to teach client problem-solving skills (i.e., defining a problem specifically, generating possible solutions, evaluating the pros and cons of each solution, selecting and implementing a plan of action, evaluating the efficacy of the plan, accepting or revising the plan); role-play application of the problem-solving skill to a real life issue (or assign Applying Problem-Solving to Interpersonal Conflict in the Adult Psychotherapy Homework Planner by Jenniffer). Teach conflict resolution skills (e.g., empathy, active listening, I messages, respectful communication, assertiveness without aggression, compromise); use psychoeducation, modeling, role-playing, and rehearsal to work through several current conflicts; assign homework exercises; review and repeat so as to integrate their use into the client's life. Consistent with the treatment model, discuss how cognitive, behavioral, interpersonal, and/or other factors (e.g., family history) contribute to depression.  Diagnosis:Generalized anxiety disorder  Major depression, recurrent, chronic  Plan:  -pt provided lengthy contact list of ERP therapists that accept Medicare -he is to call and secure an appointment prior to our next session. -meet again Friday, November 22, 2024 at 12pm in person.

## 2024-11-12 DIAGNOSIS — M170 Bilateral primary osteoarthritis of knee: Secondary | ICD-10-CM | POA: Diagnosis not present

## 2024-11-13 ENCOUNTER — Other Ambulatory Visit: Payer: Self-pay | Admitting: Family Medicine

## 2024-11-13 DIAGNOSIS — F339 Major depressive disorder, recurrent, unspecified: Secondary | ICD-10-CM

## 2024-11-14 NOTE — Telephone Encounter (Signed)
 Requested Prescriptions  Pending Prescriptions Disp Refills   busPIRone  (BUSPAR ) 10 MG tablet [Pharmacy Med Name: BUSPIRONE  HYDROCHLORIDE 10 MG Oral Tablet] 180 tablet 3    Sig: TAKE 1 TABLET TWICE DAILY     Psychiatry: Anxiolytics/Hypnotics - Non-controlled Passed - 11/14/2024  3:10 PM      Passed - Valid encounter within last 12 months    Recent Outpatient Visits           3 weeks ago Hemiparesis of right dominant side as late effect of cerebral infarction [facility])   [place] [facility] [place], Dorette, MD   3 months ago Hemiparesis of right dominant side as late effect of cerebral infarction [facility])   [place] [facility] [place], Dorette, MD   5 months ago Hemiparesis of right dominant side as late effect of cerebral infarction [facility])   [facility] Sowles, Krichna, MD       Future Appointments             In 1 month Agbor-Etang, Redell, MD [facility]

## 2024-11-20 ENCOUNTER — Ambulatory Visit: Payer: PRIVATE HEALTH INSURANCE | Admitting: Professional

## 2024-11-21 ENCOUNTER — Encounter: Payer: Self-pay | Admitting: Family Medicine

## 2024-11-22 ENCOUNTER — Ambulatory Visit: Admitting: Family Medicine

## 2024-11-22 ENCOUNTER — Ambulatory Visit: Payer: PRIVATE HEALTH INSURANCE | Admitting: Professional

## 2024-11-22 NOTE — Progress Notes (Unsigned)
   Tom Underwood, [facility]

## 2025-01-08 ENCOUNTER — Ambulatory Visit: Admitting: Cardiology

## 2025-01-15 ENCOUNTER — Ambulatory Visit: Payer: PRIVATE HEALTH INSURANCE | Admitting: Professional

## 2025-01-29 ENCOUNTER — Ambulatory Visit: Payer: PRIVATE HEALTH INSURANCE | Admitting: Professional

## 2025-02-05 ENCOUNTER — Ambulatory Visit: Payer: PRIVATE HEALTH INSURANCE | Admitting: Professional

## 2025-02-18 ENCOUNTER — Ambulatory Visit: Admitting: Cardiology

## 2025-04-22 ENCOUNTER — Ambulatory Visit: Admitting: Family Medicine

## 2025-06-12 ENCOUNTER — Ambulatory Visit

## 2025-07-21 ENCOUNTER — Ambulatory Visit (INDEPENDENT_AMBULATORY_CARE_PROVIDER_SITE_OTHER): Admitting: Nurse Practitioner
